# Patient Record
Sex: Female | Born: 1935 | Race: White | Hispanic: No | Smoking: Never smoker
Health system: Southern US, Community
[De-identification: ages and names within clinical notes are randomized; demographics above are authoritative.]

## PROBLEM LIST (undated history)

## (undated) DIAGNOSIS — I351 Nonrheumatic aortic (valve) insufficiency: Secondary | ICD-10-CM

## (undated) DIAGNOSIS — E039 Hypothyroidism, unspecified: Secondary | ICD-10-CM

## (undated) DIAGNOSIS — H9190 Unspecified hearing loss, unspecified ear: Secondary | ICD-10-CM

## (undated) DIAGNOSIS — R519 Headache, unspecified: Secondary | ICD-10-CM

## (undated) DIAGNOSIS — G4730 Sleep apnea, unspecified: Secondary | ICD-10-CM

## (undated) DIAGNOSIS — J45909 Unspecified asthma, uncomplicated: Secondary | ICD-10-CM

## (undated) DIAGNOSIS — M4800 Spinal stenosis, site unspecified: Secondary | ICD-10-CM

## (undated) DIAGNOSIS — H269 Unspecified cataract: Secondary | ICD-10-CM

## (undated) DIAGNOSIS — N189 Chronic kidney disease, unspecified: Secondary | ICD-10-CM

## (undated) DIAGNOSIS — J189 Pneumonia, unspecified organism: Secondary | ICD-10-CM

## (undated) DIAGNOSIS — M545 Low back pain, unspecified: Secondary | ICD-10-CM

## (undated) DIAGNOSIS — K219 Gastro-esophageal reflux disease without esophagitis: Secondary | ICD-10-CM

## (undated) DIAGNOSIS — F0390 Unspecified dementia without behavioral disturbance: Secondary | ICD-10-CM

## (undated) DIAGNOSIS — Z87440 Personal history of urinary (tract) infections: Secondary | ICD-10-CM

## (undated) DIAGNOSIS — Z950 Presence of cardiac pacemaker: Secondary | ICD-10-CM

## (undated) DIAGNOSIS — M810 Age-related osteoporosis without current pathological fracture: Secondary | ICD-10-CM

## (undated) DIAGNOSIS — I639 Cerebral infarction, unspecified: Secondary | ICD-10-CM

## (undated) DIAGNOSIS — IMO0001 Reserved for inherently not codable concepts without codable children: Secondary | ICD-10-CM

## (undated) DIAGNOSIS — M5430 Sciatica, unspecified side: Secondary | ICD-10-CM

## (undated) DIAGNOSIS — Z9981 Dependence on supplemental oxygen: Secondary | ICD-10-CM

## (undated) DIAGNOSIS — M797 Fibromyalgia: Secondary | ICD-10-CM

## (undated) DIAGNOSIS — I499 Cardiac arrhythmia, unspecified: Secondary | ICD-10-CM

## (undated) DIAGNOSIS — I5042 Chronic combined systolic (congestive) and diastolic (congestive) heart failure: Secondary | ICD-10-CM

## (undated) DIAGNOSIS — R55 Syncope and collapse: Secondary | ICD-10-CM

## (undated) DIAGNOSIS — Z8673 Personal history of transient ischemic attack (TIA), and cerebral infarction without residual deficits: Secondary | ICD-10-CM

## (undated) DIAGNOSIS — I2699 Other pulmonary embolism without acute cor pulmonale: Secondary | ICD-10-CM

## (undated) DIAGNOSIS — N179 Acute kidney failure, unspecified: Secondary | ICD-10-CM

## (undated) DIAGNOSIS — R011 Cardiac murmur, unspecified: Secondary | ICD-10-CM

## (undated) DIAGNOSIS — R32 Unspecified urinary incontinence: Secondary | ICD-10-CM

## (undated) DIAGNOSIS — I4891 Unspecified atrial fibrillation: Secondary | ICD-10-CM

## (undated) DIAGNOSIS — I428 Other cardiomyopathies: Secondary | ICD-10-CM

## (undated) DIAGNOSIS — Z87898 Personal history of other specified conditions: Secondary | ICD-10-CM

## (undated) DIAGNOSIS — I2510 Atherosclerotic heart disease of native coronary artery without angina pectoris: Secondary | ICD-10-CM

## (undated) DIAGNOSIS — E785 Hyperlipidemia, unspecified: Secondary | ICD-10-CM

## (undated) DIAGNOSIS — R001 Bradycardia, unspecified: Secondary | ICD-10-CM

## (undated) DIAGNOSIS — S86811A Strain of other muscle(s) and tendon(s) at lower leg level, right leg, initial encounter: Secondary | ICD-10-CM

## (undated) DIAGNOSIS — D649 Anemia, unspecified: Secondary | ICD-10-CM

## (undated) DIAGNOSIS — Z9889 Other specified postprocedural states: Secondary | ICD-10-CM

## (undated) DIAGNOSIS — M1990 Unspecified osteoarthritis, unspecified site: Secondary | ICD-10-CM

## (undated) DIAGNOSIS — R296 Repeated falls: Secondary | ICD-10-CM

## (undated) DIAGNOSIS — R51 Headache: Secondary | ICD-10-CM

## (undated) DIAGNOSIS — I350 Nonrheumatic aortic (valve) stenosis: Principal | ICD-10-CM

## (undated) DIAGNOSIS — Z9289 Personal history of other medical treatment: Secondary | ICD-10-CM

## (undated) DIAGNOSIS — Z87442 Personal history of urinary calculi: Secondary | ICD-10-CM

## (undated) DIAGNOSIS — G629 Polyneuropathy, unspecified: Secondary | ICD-10-CM

## (undated) DIAGNOSIS — Z8709 Personal history of other diseases of the respiratory system: Secondary | ICD-10-CM

## (undated) DIAGNOSIS — R112 Nausea with vomiting, unspecified: Secondary | ICD-10-CM

## (undated) DIAGNOSIS — G8929 Other chronic pain: Secondary | ICD-10-CM

## (undated) DIAGNOSIS — R3915 Urgency of urination: Secondary | ICD-10-CM

## (undated) DIAGNOSIS — I10 Essential (primary) hypertension: Secondary | ICD-10-CM

## (undated) HISTORY — DX: Nonrheumatic aortic (valve) stenosis: I35.0

## (undated) HISTORY — PX: BACK SURGERY: SHX140

## (undated) HISTORY — DX: Essential (primary) hypertension: I10

## (undated) HISTORY — PX: BUNIONECTOMY: SHX129

## (undated) HISTORY — DX: Low back pain, unspecified: M54.50

## (undated) HISTORY — DX: Chronic kidney disease, unspecified: N18.9

## (undated) HISTORY — PX: ELBOW SURGERY: SHX618

## (undated) HISTORY — PX: THYROIDECTOMY, PARTIAL: SHX18

## (undated) HISTORY — PX: CARDIAC CATHETERIZATION: SHX172

## (undated) HISTORY — PX: CATARACT EXTRACTION: SUR2

## (undated) HISTORY — PX: PACEMAKER INSERTION: SHX728

## (undated) HISTORY — DX: Acute kidney failure, unspecified: N17.9

## (undated) HISTORY — DX: Unspecified dementia, unspecified severity, without behavioral disturbance, psychotic disturbance, mood disturbance, and anxiety: F03.90

## (undated) HISTORY — PX: NASAL SEPTUM SURGERY: SHX37

## (undated) HISTORY — PX: SHOULDER SURGERY: SHX246

## (undated) HISTORY — PX: APPENDECTOMY: SHX54

## (undated) HISTORY — DX: Personal history of transient ischemic attack (TIA), and cerebral infarction without residual deficits: Z86.73

## (undated) HISTORY — DX: Syncope and collapse: R55

## (undated) HISTORY — DX: Anemia, unspecified: D64.9

## (undated) HISTORY — PX: NOSE SURGERY: SHX723

## (undated) HISTORY — PX: ABDOMINAL HYSTERECTOMY: SHX81

## (undated) HISTORY — DX: Low back pain: M54.5

## (undated) HISTORY — PX: TONSILLECTOMY: SUR1361

## (undated) HISTORY — DX: Atherosclerotic heart disease of native coronary artery without angina pectoris: I25.10

## (undated) HISTORY — DX: Unspecified atrial fibrillation: I48.91

## (undated) HISTORY — DX: Other pulmonary embolism without acute cor pulmonale: I26.99

## (undated) HISTORY — DX: Other chronic pain: G89.29

## (undated) HISTORY — DX: Hyperlipidemia, unspecified: E78.5

## (undated) HISTORY — DX: Strain of other muscle(s) and tendon(s) at lower leg level, right leg, initial encounter: S86.811A

## (undated) HISTORY — DX: Other cardiomyopathies: I42.8

## (undated) HISTORY — DX: Bradycardia, unspecified: R00.1

## (undated) HISTORY — DX: Age-related osteoporosis without current pathological fracture: M81.0

## (undated) HISTORY — PX: KNEE SURGERY: SHX244

## (undated) HISTORY — DX: Gastro-esophageal reflux disease without esophagitis: K21.9

---

## 1998-02-07 ENCOUNTER — Ambulatory Visit (HOSPITAL_COMMUNITY): Admission: RE | Admit: 1998-02-07 | Discharge: 1998-02-07 | Payer: Self-pay | Admitting: Obstetrics & Gynecology

## 1999-06-19 ENCOUNTER — Encounter: Payer: Self-pay | Admitting: Orthopedic Surgery

## 1999-06-19 ENCOUNTER — Ambulatory Visit (HOSPITAL_COMMUNITY): Admission: RE | Admit: 1999-06-19 | Discharge: 1999-06-19 | Payer: Self-pay | Admitting: Orthopedic Surgery

## 1999-07-04 ENCOUNTER — Ambulatory Visit (HOSPITAL_COMMUNITY): Admission: RE | Admit: 1999-07-04 | Discharge: 1999-07-04 | Payer: Self-pay | Admitting: Orthopedic Surgery

## 1999-07-04 ENCOUNTER — Encounter: Payer: Self-pay | Admitting: Orthopedic Surgery

## 1999-07-18 ENCOUNTER — Encounter: Payer: Self-pay | Admitting: Orthopedic Surgery

## 1999-07-18 ENCOUNTER — Ambulatory Visit (HOSPITAL_COMMUNITY): Admission: RE | Admit: 1999-07-18 | Discharge: 1999-07-18 | Payer: Self-pay | Admitting: Orthopedic Surgery

## 1999-10-29 ENCOUNTER — Other Ambulatory Visit: Admission: RE | Admit: 1999-10-29 | Discharge: 1999-10-29 | Payer: Self-pay | Admitting: Obstetrics and Gynecology

## 1999-12-11 ENCOUNTER — Encounter: Payer: Self-pay | Admitting: Otolaryngology

## 1999-12-13 ENCOUNTER — Inpatient Hospital Stay (HOSPITAL_COMMUNITY): Admission: RE | Admit: 1999-12-13 | Discharge: 1999-12-14 | Payer: Self-pay | Admitting: Otolaryngology

## 1999-12-13 ENCOUNTER — Encounter (INDEPENDENT_AMBULATORY_CARE_PROVIDER_SITE_OTHER): Payer: Self-pay | Admitting: *Deleted

## 2001-08-23 ENCOUNTER — Inpatient Hospital Stay (HOSPITAL_COMMUNITY): Admission: EM | Admit: 2001-08-23 | Discharge: 2001-09-01 | Payer: Self-pay | Admitting: Emergency Medicine

## 2001-08-23 ENCOUNTER — Encounter: Payer: Self-pay | Admitting: Emergency Medicine

## 2001-08-23 ENCOUNTER — Encounter: Payer: Self-pay | Admitting: Cardiovascular Disease

## 2001-08-24 ENCOUNTER — Encounter: Payer: Self-pay | Admitting: Cardiovascular Disease

## 2001-08-25 ENCOUNTER — Encounter: Payer: Self-pay | Admitting: Cardiovascular Disease

## 2001-08-26 ENCOUNTER — Encounter: Payer: Self-pay | Admitting: Cardiovascular Disease

## 2001-09-01 ENCOUNTER — Inpatient Hospital Stay: Admission: AD | Admit: 2001-09-01 | Discharge: 2001-09-11 | Payer: Self-pay | Admitting: Cardiology

## 2001-09-02 ENCOUNTER — Encounter (HOSPITAL_COMMUNITY): Admission: RE | Admit: 2001-09-02 | Discharge: 2001-09-07 | Payer: Self-pay | Admitting: Cardiology

## 2001-09-07 ENCOUNTER — Encounter: Payer: Self-pay | Admitting: Cardiology

## 2003-04-26 ENCOUNTER — Emergency Department (HOSPITAL_COMMUNITY): Admission: EM | Admit: 2003-04-26 | Discharge: 2003-04-26 | Payer: Self-pay | Admitting: Emergency Medicine

## 2003-06-28 ENCOUNTER — Encounter: Admission: RE | Admit: 2003-06-28 | Discharge: 2003-06-28 | Payer: Self-pay | Admitting: Orthopaedic Surgery

## 2003-10-26 ENCOUNTER — Ambulatory Visit (HOSPITAL_COMMUNITY): Admission: RE | Admit: 2003-10-26 | Discharge: 2003-10-26 | Payer: Self-pay | Admitting: Cardiology

## 2003-10-26 ENCOUNTER — Inpatient Hospital Stay (HOSPITAL_COMMUNITY): Admission: EM | Admit: 2003-10-26 | Discharge: 2003-11-05 | Payer: Self-pay | Admitting: Emergency Medicine

## 2004-07-22 ENCOUNTER — Encounter: Admission: RE | Admit: 2004-07-22 | Discharge: 2004-07-22 | Payer: Self-pay | Admitting: Neurology

## 2004-10-15 ENCOUNTER — Encounter
Admission: RE | Admit: 2004-10-15 | Discharge: 2004-10-15 | Payer: Self-pay | Admitting: Physical Medicine and Rehabilitation

## 2006-08-13 ENCOUNTER — Encounter: Admission: RE | Admit: 2006-08-13 | Discharge: 2006-08-13 | Payer: Self-pay | Admitting: *Deleted

## 2006-08-25 ENCOUNTER — Encounter: Admission: RE | Admit: 2006-08-25 | Discharge: 2006-11-23 | Payer: Self-pay | Admitting: Specialist

## 2006-11-05 ENCOUNTER — Emergency Department (HOSPITAL_COMMUNITY): Admission: EM | Admit: 2006-11-05 | Discharge: 2006-11-06 | Payer: Self-pay | Admitting: Emergency Medicine

## 2006-11-17 ENCOUNTER — Inpatient Hospital Stay (HOSPITAL_COMMUNITY): Admission: AD | Admit: 2006-11-17 | Discharge: 2006-11-23 | Payer: Self-pay | Admitting: Internal Medicine

## 2006-12-10 ENCOUNTER — Encounter: Admission: RE | Admit: 2006-12-10 | Discharge: 2006-12-10 | Payer: Self-pay | Admitting: Internal Medicine

## 2007-09-22 ENCOUNTER — Ambulatory Visit: Payer: Self-pay | Admitting: Licensed Clinical Social Worker

## 2007-10-27 ENCOUNTER — Ambulatory Visit: Payer: Self-pay | Admitting: Licensed Clinical Social Worker

## 2007-12-15 ENCOUNTER — Ambulatory Visit: Payer: Self-pay | Admitting: Licensed Clinical Social Worker

## 2007-12-29 ENCOUNTER — Ambulatory Visit: Payer: Self-pay | Admitting: Licensed Clinical Social Worker

## 2008-02-09 ENCOUNTER — Ambulatory Visit: Payer: Self-pay | Admitting: Licensed Clinical Social Worker

## 2008-03-10 ENCOUNTER — Ambulatory Visit (HOSPITAL_COMMUNITY): Admission: RE | Admit: 2008-03-10 | Discharge: 2008-03-10 | Payer: Self-pay | Admitting: *Deleted

## 2008-03-10 ENCOUNTER — Encounter (INDEPENDENT_AMBULATORY_CARE_PROVIDER_SITE_OTHER): Payer: Self-pay | Admitting: *Deleted

## 2008-04-05 ENCOUNTER — Ambulatory Visit: Payer: Self-pay | Admitting: Licensed Clinical Social Worker

## 2008-06-14 ENCOUNTER — Ambulatory Visit: Payer: Self-pay | Admitting: Licensed Clinical Social Worker

## 2008-07-22 ENCOUNTER — Emergency Department (HOSPITAL_COMMUNITY): Admission: EM | Admit: 2008-07-22 | Discharge: 2008-07-22 | Payer: Self-pay | Admitting: Emergency Medicine

## 2008-08-23 ENCOUNTER — Ambulatory Visit: Payer: Self-pay | Admitting: Licensed Clinical Social Worker

## 2009-02-26 ENCOUNTER — Encounter: Admission: RE | Admit: 2009-02-26 | Discharge: 2009-02-26 | Payer: Self-pay | Admitting: Internal Medicine

## 2009-08-30 ENCOUNTER — Encounter: Admission: RE | Admit: 2009-08-30 | Discharge: 2009-08-30 | Payer: Self-pay | Admitting: Otolaryngology

## 2009-12-10 ENCOUNTER — Ambulatory Visit: Payer: Self-pay | Admitting: Cardiology

## 2009-12-10 ENCOUNTER — Inpatient Hospital Stay (HOSPITAL_COMMUNITY): Admission: AD | Admit: 2009-12-10 | Discharge: 2009-12-15 | Payer: Self-pay | Admitting: Internal Medicine

## 2009-12-10 ENCOUNTER — Encounter (INDEPENDENT_AMBULATORY_CARE_PROVIDER_SITE_OTHER): Payer: Self-pay | Admitting: Internal Medicine

## 2009-12-12 ENCOUNTER — Encounter (INDEPENDENT_AMBULATORY_CARE_PROVIDER_SITE_OTHER): Payer: Self-pay | Admitting: Internal Medicine

## 2010-04-21 DIAGNOSIS — R001 Bradycardia, unspecified: Secondary | ICD-10-CM

## 2010-04-21 HISTORY — DX: Bradycardia, unspecified: R00.1

## 2010-05-12 ENCOUNTER — Encounter: Payer: Self-pay | Admitting: Orthopedic Surgery

## 2010-06-15 ENCOUNTER — Encounter: Payer: Self-pay | Admitting: Cardiology

## 2010-06-15 ENCOUNTER — Inpatient Hospital Stay (HOSPITAL_COMMUNITY)
Admission: EM | Admit: 2010-06-15 | Discharge: 2010-07-03 | DRG: 242 | Disposition: A | Discharge status: Skilled Nursing Facility | Payer: Medicare Other | Attending: Cardiology | Admitting: Cardiology

## 2010-06-15 ENCOUNTER — Emergency Department (HOSPITAL_COMMUNITY): Payer: Medicare Other

## 2010-06-15 DIAGNOSIS — Z86711 Personal history of pulmonary embolism: Secondary | ICD-10-CM

## 2010-06-15 DIAGNOSIS — E870 Hyperosmolality and hypernatremia: Secondary | ICD-10-CM | POA: Diagnosis present

## 2010-06-15 DIAGNOSIS — I498 Other specified cardiac arrhythmias: Principal | ICD-10-CM | POA: Diagnosis present

## 2010-06-15 DIAGNOSIS — I080 Rheumatic disorders of both mitral and aortic valves: Secondary | ICD-10-CM | POA: Diagnosis present

## 2010-06-15 DIAGNOSIS — I69998 Other sequelae following unspecified cerebrovascular disease: Secondary | ICD-10-CM

## 2010-06-15 DIAGNOSIS — M545 Low back pain, unspecified: Secondary | ICD-10-CM | POA: Diagnosis present

## 2010-06-15 DIAGNOSIS — I509 Heart failure, unspecified: Secondary | ICD-10-CM | POA: Diagnosis present

## 2010-06-15 DIAGNOSIS — J4489 Other specified chronic obstructive pulmonary disease: Secondary | ICD-10-CM | POA: Diagnosis present

## 2010-06-15 DIAGNOSIS — Z888 Allergy status to other drugs, medicaments and biological substances status: Secondary | ICD-10-CM

## 2010-06-15 DIAGNOSIS — K219 Gastro-esophageal reflux disease without esophagitis: Secondary | ICD-10-CM | POA: Diagnosis present

## 2010-06-15 DIAGNOSIS — I4891 Unspecified atrial fibrillation: Secondary | ICD-10-CM | POA: Diagnosis present

## 2010-06-15 DIAGNOSIS — I5023 Acute on chronic systolic (congestive) heart failure: Secondary | ICD-10-CM | POA: Diagnosis present

## 2010-06-15 DIAGNOSIS — D631 Anemia in chronic kidney disease: Secondary | ICD-10-CM | POA: Diagnosis present

## 2010-06-15 DIAGNOSIS — G8929 Other chronic pain: Secondary | ICD-10-CM | POA: Diagnosis present

## 2010-06-15 DIAGNOSIS — N179 Acute kidney failure, unspecified: Secondary | ICD-10-CM | POA: Diagnosis present

## 2010-06-15 DIAGNOSIS — N184 Chronic kidney disease, stage 4 (severe): Secondary | ICD-10-CM | POA: Diagnosis present

## 2010-06-15 DIAGNOSIS — F0390 Unspecified dementia without behavioral disturbance: Secondary | ICD-10-CM | POA: Diagnosis present

## 2010-06-15 DIAGNOSIS — Z7901 Long term (current) use of anticoagulants: Secondary | ICD-10-CM

## 2010-06-15 DIAGNOSIS — J449 Chronic obstructive pulmonary disease, unspecified: Secondary | ICD-10-CM | POA: Diagnosis present

## 2010-06-15 DIAGNOSIS — R55 Syncope and collapse: Secondary | ICD-10-CM

## 2010-06-15 DIAGNOSIS — Z9849 Cataract extraction status, unspecified eye: Secondary | ICD-10-CM

## 2010-06-15 DIAGNOSIS — I2510 Atherosclerotic heart disease of native coronary artery without angina pectoris: Secondary | ICD-10-CM | POA: Diagnosis present

## 2010-06-15 DIAGNOSIS — I517 Cardiomegaly: Secondary | ICD-10-CM | POA: Diagnosis present

## 2010-06-15 DIAGNOSIS — E785 Hyperlipidemia, unspecified: Secondary | ICD-10-CM | POA: Diagnosis present

## 2010-06-15 DIAGNOSIS — R5381 Other malaise: Secondary | ICD-10-CM | POA: Diagnosis present

## 2010-06-15 DIAGNOSIS — I129 Hypertensive chronic kidney disease with stage 1 through stage 4 chronic kidney disease, or unspecified chronic kidney disease: Secondary | ICD-10-CM | POA: Diagnosis present

## 2010-06-15 DIAGNOSIS — R29898 Other symptoms and signs involving the musculoskeletal system: Secondary | ICD-10-CM | POA: Diagnosis present

## 2010-06-15 DIAGNOSIS — E876 Hypokalemia: Secondary | ICD-10-CM | POA: Diagnosis not present

## 2010-06-15 DIAGNOSIS — E039 Hypothyroidism, unspecified: Secondary | ICD-10-CM | POA: Diagnosis present

## 2010-06-15 DIAGNOSIS — I428 Other cardiomyopathies: Secondary | ICD-10-CM | POA: Diagnosis present

## 2010-06-15 DIAGNOSIS — Z7982 Long term (current) use of aspirin: Secondary | ICD-10-CM

## 2010-06-15 LAB — BASIC METABOLIC PANEL
GFR calc Af Amer: 10 mL/min — ABNORMAL LOW (ref >60)
Sodium: 129 mEq/L — ABNORMAL LOW (ref 135–145)

## 2010-06-15 LAB — COMPREHENSIVE METABOLIC PANEL
ALT: 134 U/L — ABNORMAL HIGH (ref 0–35)
Albumin: 2.9 g/dL — ABNORMAL LOW (ref 3.5–5.2)
BUN: 86 mg/dL — ABNORMAL HIGH (ref 6–23)
CO2: 19 mEq/L (ref 19–32)
GFR calc non Af Amer: 9 mL/min — ABNORMAL LOW (ref >60)
Glucose, Bld: 139 mg/dL — ABNORMAL HIGH (ref 70–99)
Potassium: 5.5 mEq/L — ABNORMAL HIGH (ref 3.5–5.1)

## 2010-06-15 LAB — DIFFERENTIAL
Basophils Absolute: 0 10*3/uL (ref 0.0–0.1)
Basophils Relative: 0 % (ref 0–1)
Lymphocytes Relative: 7 % — ABNORMAL LOW (ref 12–46)
Neutro Abs: 7.8 10*3/uL — ABNORMAL HIGH (ref 1.7–7.7)

## 2010-06-15 LAB — POCT CARDIAC MARKERS: Troponin i, poc: <0.05 ng/mL (ref 0.00–0.09)

## 2010-06-15 LAB — CBC
HCT: 23.1 % — ABNORMAL LOW (ref 36.0–46.0)
Hemoglobin: 7.8 g/dL — ABNORMAL LOW (ref 12.0–15.0)
MCH: 28.7 pg (ref 26.0–34.0)
MCHC: 33.8 g/dL (ref 30.0–36.0)
MCV: 84.9 fL (ref 78.0–100.0)
Platelets: 252 10*3/uL (ref 150–400)
RDW: 16.4 % — ABNORMAL HIGH (ref 11.5–15.5)
WBC: 9.5 10*3/uL (ref 4.0–10.5)

## 2010-06-15 LAB — POCT I-STAT, CHEM 8
Calcium, Ion: 1.02 mmol/L — ABNORMAL LOW (ref 1.12–1.32)
Glucose, Bld: 153 mg/dL — ABNORMAL HIGH (ref 70–99)
HCT: 26 % — ABNORMAL LOW (ref 36.0–46.0)
Potassium: 5.2 mEq/L — ABNORMAL HIGH (ref 3.5–5.1)
TCO2: 14 mmol/L (ref 0–100)

## 2010-06-15 LAB — PROTIME-INR: Prothrombin Time: 29.4 seconds — ABNORMAL HIGH (ref 11.6–15.2)

## 2010-06-15 NOTE — H&P (Signed)
NAMESHARNAE, [name]              ACCOUNT NO.:  [id number]  MEDICAL RECORD NO.:  [id number]           PATIENT TYPE:  E  LOCATION:  [facility]                         FACILITY:  [facility]  PHYSICIAN:  Jonelle Sidle, MD DATE OF BIRTH:  1932-05-28  DATE OF ADMISSION:  06/15/2010 DATE OF DISCHARGE:                             HISTORY & PHYSICAL   PRIMARY CARE PHYSICIAN:  Massie Maroon, MD  REASON FOR ADMISSION:  Apparent syncope with subsequent diagnosis of bradycardia and congestive heart failure.  HISTORY OF PRESENT ILLNESS:  [name] is a 77 year old woman previously followed by Dr. Aggie Cosier with a history of nonobstructive coronary artery disease diagnosed at catheterization back in 2003, systolic congestive heart failure exacerbations over time with LVEF ranging from 30% up to 50%, atrial fibrillation managed with Coumadin and amiodarone, chronic renal insufficiency with episode of acute renal failure in August 2011, moderate aortic valve stenosis, hypothyroidism on Synthroid, and hypertension among other problems listed below.  She reports being in her usual state of health until the last 4-5 days.  She states that over this time period, she has been progressively weak with dyspnea on exertion of at least NYHA class III with ambulating around her home, also intermittent chest pressure.  She had a fall today, possibly syncope, and EMS was summoned with subsequent transport to the emergency room.  She was noted to be significantly bradycardic with heart rates around 30, junctional escape rhythm noted with interventricular conduction delay of incomplete left bundle-branch block type.  Systolic blood pressure was in the 80s to 90s, and the patient was noted to be mildly hypoxic and short of breath.  She was treated by emergency department staff with a fluid bolus as well as atropine. Follow-up labs revealed a potassium of 5.2, BUN and creatinine of 97 and 5.2 respectively, BNP of  2017, sodium of 129, hemoglobin of 8.8, and troponin I of less than 0.05.  We were consulted to see her and had additional treatment given including calcium gluconate and institution of dopamine infusion.  The patient's heart rate increased into the 40s to 50s, still a junctional rhythm, and her systolic blood pressure increased into the 100 to 110 range.  She has a Foley catheter in place with little urine output at this point, and states that she feels somewhat better, although remains weak and somewhat short of breath. Also states that she feels "cold."  In reviewing her history, I note that she had an episode of fairly acute systolic heart failure back in 2003, at which time, she underwent cardiac catheterization that did not demonstrate any obstructive coronary artery disease.  She apparently required fairly aggressive support at that time including intra-aortic balloon pump and right heart catheterization.  Her more recent episode of acute renal failure in August 2011 was felt to be potentially related to medications and alsodehydration with creatinine improving from 5.1 down to 2.5 at discharge. She was taken off of an angiotensin receptor blocker at that time, and has presumably been on the present medical regimen noted below since then.  I have no other outpatient records at this time for  review.  ALLERGIES:  Listed as NUBAIN, CODEINE, AND DARVON.  HOME MEDICATIONS: 1. Alendronate 70 mg weekly. 2. Amiodarone 200 mg p.o. daily. 3. Amlodipine 5 mg p.o. daily. 4. Aricept 10 mg p.o. at bedtime. 5. Aspirin 81 mg p.o. daily. 6. Calcitriol 0.25 mcg daily. 7. Carvedilol 25 mg p.o. t.i.d. 8. Coumadin 3 mg 1/2 to 1 tablet as directed. 9. Lasix 20 mg p.o. daily. 10.Hydralazine 25 mg p.o. t.i.d. 11.Hydrocodone/APAP 10/325 mg p.o. q.6 h. p.r.n. 12.Imdur 30 mg p.o. b.i.d. 13.Lyrica 50 mg p.o. daily. 14.Metronidazole 500 mg p.o. q.8 h. 15.Namenda 10 mg p.o. daily. 16.Nexium 40 mg  p.o. daily. 17.Nitroglycerin sublingual 0.4 mg p.r.n. 18.Pravachol 40 mg p.o. at bedtime. 19.Nasal saline spray p.r.n. 20.Synthroid 100 mcg p.o. daily. 21.Vitamin D 1000 units p.o. daily. 22.Zolpidem 5 mg p.o. at bedtime p.r.n. 23.Zyrtec OTC one p.o. daily p.r.n.  PAST MEDICAL HISTORY:  As discussed above.  ADDITIONAL PROBLEMS: 1. Chronic anemia. 2. Gastroesophageal reflux disease. 3. Hearing loss. 4. Migraine headaches. 5. Previous history of stroke. 6. Bilateral cataract surgery. 7. Bunion surgery. 8. Appendectomy. 9. Hysterectomy. 10.Partial thyroidectomy. 11.Back surgery. 12.Left knee surgery. 13.Nasal surgery. 14.Ulnar nerve surgery.  FAMILY HISTORY:  The patient's mother died with ovarian cancer.  Father died with history of epilepsy at young age, in his 54s.  History of stroke and diabetes in the patient's siblings.  SOCIAL HISTORY:  The patient is retired and divorced.  She has lived locally with her son.  No active tobacco or alcohol use.  REVIEW OF SYSTEMS:  As described above.  Reports no obvious fevers or chills, no cough or hemoptysis.  States that she has been compliant with her medications.  Has had intermittent chest pain with exertion recently.  No palpitations.  No orthopnea.  Reports increasing lower extremity edema over the last few days.  Appetite has been fair. Otherwise, reviewed negative.  PHYSICAL EXAMINATION:  VITAL SIGNS:  Blood pressure 110/80, heart rate 50, and junctional rhythm.  Lung saturations is 92% on 4 liters nasal cannula.  Respirations 24, temperature 97.3 degrees. GENERAL:  This is an obese elderly woman with short of breath to mild degree without active chest pain, also complains of nausea intermittently, but feeling somewhat better. HEENT:  Conjunctiva is normal.  Oropharynx clear. NECK:  Supple.  Difficult to assess jugular venous pressure.  There is a right EJ IV in place.  No obvious thyromegaly.  No significant  carotid bruits. LUNGS:  Exhibit coarse breath sounds with scattered crackles.  No egophony. CARDIAC:  Distant regular heart sounds, bradycardic, no obvious S3, soft systolic murmur at the base.  No pericardial rub. ABDOMEN:  Soft, somewhat protuberant.  Bowel sounds are diminished.  No guarding or rebound. EXTREMITIES:  Exhibit 2+ edema.  Distal pulses 1+. SKIN:  Warm and dry. MUSCULOSKELETAL:  Mild kyphosis is noted. NEUROPSYCHIATRIC:  The patient is alert and oriented x3.  Affect is grossly appropriate.  LABORATORY DATA:  WBC is 9.5, hemoglobin 8.8, hematocrit 26.0, platelets 252.  INR is 2.7.  Sodium 129, potassium 5.2, chloride 104, bicarb 16, glucose 153, BUN 97, creatinine 5.2, CK-MB is 10.6 with troponin-I of less than 0.05, and myoglobin greater than 500, BNP is 2017.  Chest x-ray from February 25 shows cardiomegaly with bilateral air space disease, consistent with pulmonary edema.  IMPRESSION: 1. Presentation with apparent syncope, following increasing shortness     of breath with activity and weakness as well as intermittent chest     pain.  The patient is  noted to be significantly bradycardic with a     junctional escape rhythm.  She is concurrently mildly hyperkalemic     with a potassium of 5.2 in the setting of acute on     chronic renal failure, also presumed systolic congestive heart     failure with BNP of 2017 and pulmonary edema by chest x-ray.  Last     LVEF by echocardiogram in August 2011 was 50% with global     hypokinesis.  The patient reports compliance with her regular     medications listed above.  The actual sequence of events in her     presenting illness is not entirely clear.  Whether she has     developed acute on chronic renal failure with decreased clearance     of her regular medications resulting in congestive heart failure     and bradycardia is possible, however, she may also be having     unstable angina with ischemic bradycardia and  exacerbation of     congestive heart failure, perhaps has manifested progression in     primary conduction system disease with exacerbation of congestive     heart failure, or has relatively low output and congestive heart     failure with subsequent acute on chronic renal failure. 2. Atrial fibrillation, managed with Coumadin, and amiodarone as an     outpatient.  Also on Coreg, listed at t.i.d. dosing, although not     entirely certain about this.  Her INR is therapeutic suggesting     that she has been taking her medicines. 3. History of hypertension, the patient presented hypotensive in the     setting of her acute illness. 4. Chronic renal insufficiency, baseline not entirely certain, but     creatinine was 2.5 as of her discharge last August. 5. Hyperlipidemia, on Pravachol. 6. History of previously documented nonobstructive coronary artery     disease at catheterization in 2003. 7. Hypothyroidism, on Synthroid. 8. Presumably some degree of dementia based on current treatment with     Aricept.  Incidentally, Aricept can also complicate bradycardia,     particularly on other contributing medications.  PLAN:  The patient is being admitted to the CCU.  Temporary pacer pads are in place.  Plan is to continue dopamine infusion, follow-up 2-D echocardiogram to reassess cardiac structure and function, and cycle cardiac markers.  Full-dose anticoagulation is not planned at this time in light of INR of 2.7, hemoglobin of 8.8, and until more objective information is available.  SCDs will be used for DVT prophylaxis.  Most of her regular medications will be held, specifically including amiodarone, Coumadin, Coreg, Norvasc, Aricept, hydralazine, and Imdur. We will initiate Lasix at 40 mg IV q.12 h. and continue Foley catheter. A formal Nephrology consultation will be requested.  She has difficult IV access, and we will plan to ask for a PICC line, also to help [place] a transduced CVP and obtain  co-oximetry numbers to investigate the possibility of low output heart failure.  If this is not possible, a central line can be placed, and INR reversed if necessary. Follow-up TSH will be obtained.  While it is not clear that her symptoms are not ischemic, a cardiac catheterization is not planned at this time in light of acute renal failure, and very high risk of contrast nephropathy, necessitating hemodialysis.  She may however already be at that point, although the absolute time course of the change in her renal function is not  entirely clear.  Further plans to follow.     Jonelle Sidle, MD     SGM/MEDQ  D:  06/15/2010  T:  06/15/2010  Job:  [id number]  cc:   Massie Maroon, MD  Electronically Signed by Nona Dell MD on 06/15/2010 08:22:40 PM

## 2010-06-16 ENCOUNTER — Inpatient Hospital Stay (HOSPITAL_COMMUNITY): Payer: Medicare Other

## 2010-06-16 DIAGNOSIS — I5023 Acute on chronic systolic (congestive) heart failure: Secondary | ICD-10-CM

## 2010-06-16 DIAGNOSIS — I059 Rheumatic mitral valve disease, unspecified: Secondary | ICD-10-CM

## 2010-06-16 DIAGNOSIS — N179 Acute kidney failure, unspecified: Secondary | ICD-10-CM

## 2010-06-16 LAB — URINALYSIS, MICROSCOPIC ONLY
Ketones, ur: NEGATIVE mg/dL
Nitrite: NEGATIVE
Protein, ur: NEGATIVE mg/dL
Urine Glucose, Fasting: NEGATIVE mg/dL
Urobilinogen, UA: 0.2 mg/dL (ref 0.0–1.0)

## 2010-06-16 LAB — FERRITIN: Ferritin: 62 ng/mL (ref 10–291)

## 2010-06-16 LAB — CBC
MCH: 28.5 pg (ref 26.0–34.0)
MCV: 83.3 fL (ref 78.0–100.0)
Platelets: 269 10*3/uL (ref 150–400)
RBC: 2.81 MIL/uL — ABNORMAL LOW (ref 3.87–5.11)
RDW: 15.8 % — ABNORMAL HIGH (ref 11.5–15.5)

## 2010-06-16 LAB — IRON AND TIBC
Saturation Ratios: 3 % — ABNORMAL LOW (ref 20–55)
TIBC: 311 ug/dL (ref 250–470)
UIBC: 301 ug/dL

## 2010-06-16 LAB — BASIC METABOLIC PANEL
CO2: 17 mEq/L — ABNORMAL LOW (ref 19–32)
Calcium: 8.1 mg/dL — ABNORMAL LOW (ref 8.4–10.5)
Chloride: 102 mEq/L (ref 96–112)
GFR calc Af Amer: 10 mL/min — ABNORMAL LOW (ref >60)
Glucose, Bld: 112 mg/dL — ABNORMAL HIGH (ref 70–99)
Potassium: 5.2 mEq/L — ABNORMAL HIGH (ref 3.5–5.1)
Sodium: 130 mEq/L — ABNORMAL LOW (ref 135–145)

## 2010-06-16 LAB — PHOSPHORUS: Phosphorus: 5.7 mg/dL — ABNORMAL HIGH (ref 2.3–4.6)

## 2010-06-16 LAB — CARDIAC PANEL(CRET KIN+CKTOT+MB+TROPI)
CK, MB: 9.4 ng/mL (ref 0.3–4.0)
Relative Index: 1.6 (ref 0.0–2.5)
Total CK: 604 U/L — ABNORMAL HIGH (ref 7–177)
Troponin I: 0.06 ng/mL (ref 0.00–0.06)

## 2010-06-16 LAB — LIPID PANEL
Cholesterol: 89 mg/dL (ref 0–200)
HDL: 49 mg/dL (ref >39)

## 2010-06-16 LAB — HEPATITIS B SURFACE ANTIBODY,QUALITATIVE: Hep B S Ab: NEGATIVE

## 2010-06-16 LAB — ALT: ALT: 133 U/L — ABNORMAL HIGH (ref 0–35)

## 2010-06-17 ENCOUNTER — Inpatient Hospital Stay (HOSPITAL_COMMUNITY): Payer: Medicare Other

## 2010-06-17 LAB — PTH, INTACT AND CALCIUM
Calcium, Total (PTH): 8.3 mg/dL — ABNORMAL LOW (ref 8.4–10.5)
PTH: 178.9 pg/mL — ABNORMAL HIGH (ref 14.0–72.0)

## 2010-06-17 LAB — PREPARE FRESH FROZEN PLASMA: Unit division: 0

## 2010-06-17 LAB — COMPREHENSIVE METABOLIC PANEL
ALT: 133 U/L — ABNORMAL HIGH (ref 0–35)
AST: 121 U/L — ABNORMAL HIGH (ref 0–37)
Albumin: 2.9 g/dL — ABNORMAL LOW (ref 3.5–5.2)
Calcium: 8.5 mg/dL (ref 8.4–10.5)
GFR calc Af Amer: 19 mL/min — ABNORMAL LOW (ref >60)
Sodium: 140 mEq/L (ref 135–145)
Total Protein: 6.1 g/dL (ref 6.0–8.3)

## 2010-06-17 LAB — HEPATITIS B CORE ANTIBODY, TOTAL: Hep B Core Total Ab: NEGATIVE

## 2010-06-17 LAB — BASIC METABOLIC PANEL
BUN: 47 mg/dL — ABNORMAL HIGH (ref 6–23)
Calcium: 8 mg/dL — ABNORMAL LOW (ref 8.4–10.5)
GFR calc non Af Amer: 17 mL/min — ABNORMAL LOW (ref >60)
Potassium: 3.6 mEq/L (ref 3.5–5.1)
Sodium: 138 mEq/L (ref 135–145)

## 2010-06-17 LAB — CBC
MCHC: 33.7 g/dL (ref 30.0–36.0)
Platelets: 249 10*3/uL (ref 150–400)
RDW: 16.1 % — ABNORMAL HIGH (ref 11.5–15.5)
WBC: 10.9 10*3/uL — ABNORMAL HIGH (ref 4.0–10.5)

## 2010-06-17 LAB — TSH: TSH: 0.243 u[IU]/mL — ABNORMAL LOW (ref 0.350–4.500)

## 2010-06-17 NOTE — Consult Note (Signed)
NAMEHAVANNAH, STREAT NO.:  [id number]  MEDICAL RECORD NO.:  [id number]           PATIENT TYPE:  I  LOCATION:  2902                         FACILITY:  [facility]  PHYSICIAN:  Maree Krabbe, M.D.DATE OF BIRTH:  October 02, 1932  DATE OF CONSULTATION:  06/15/2010 DATE OF DISCHARGE:                                CONSULTATION   PRIMARY DOCTOR:  Massie Maroon, MD  REFERRING PHYSICIAN:  Jonelle Sidle, MD  REASON FOR CONSULT:  Acute on chronic renal failure.  HISTORY:  The patient is a 77 year old female with a history of chronic kidney disease, hypertension, previous stroke and atrial fibrillation on Coumadin.  Baseline creatinine was 2.6 in 2008 and 2.4 in August 2011.  The patient has been feeling weak for last few days and presented with shortness of breath and chest pain.  She had a fall and a possible syncopal episode today, as well.  At the emergency room she was found to have a junctional bradycardia with heart rate of 30,  potassium 5.2, creatinine of 5.2 and BNP of 2017.  Cardiac enzymes were negative. Hemoglobin was 8.8 and there were no definite acute changes on EKG. Chest x-ray showed pulmonary edema.  The patient was treated with atropine, IV fluids, calcium gluconate and placed on a dopamine drip with increased heart rate into the 50s and improved systolic blood pressure to 110.  She has not had much urine output during the day today.  Foley catheter has been placed.  The patient's son provides most of the history.  The patient is arousable and contributes also.  Apparently, the patient had another presentation for low heart rate in the office setting within the past several months, at which time her Coreg was decreased from 25 mg four times a day to 25 mg twice a day.  Also, her ARB, thiazide, and spironolactone were discontinued at that time.  The son states she was here in August 2011 for renal failure and indeed the computer records show that she  had a creatinine of 4.8, which improved to 2.4 at discharge at that time.  The ultrasound at that time showed kidneys of 9.6 cm on one side and 11 cm on the other side.  PAST MEDICAL HISTORY: 1. Chronic low back pain and prior history of methadone use, and     history of cardiac arrest after receiving Nubain. 2. Cardiomyopathy, EF was 30-40% in the past, but improved up to 50%     on most recent echocardiogram.  History of mild aortic stenosis by     heart catheterization in 2003, which showed no significant cororary disease.     She is also on intra-aortic balloon pump for cardiogenic shock      during that admission in 2003. 3.  Also history of hypertension, hyperlipidemia, hypothyroidism, atrial fibrillation,     chronic kidney disease, and mild stroke with left-sided weakness.   PAST SURGICAL HISTORY:  She has multiple joint surgeries including left knee, bilateral shoulders and other joint surgeries.  She has had a hysterectomy, thyroidectomy, heart catheterization.  SOCIAL HISTORY:  He is divorced, has two grown  children, a daughter and a son.  The son lives with her here in [place].  She does not smoke or drink.  FAMILY HISTORY:  Noncontributory.  REVIEW OF SYSTEMS:  Denies any recent fever, chills, sweats, headache, visual change, sore throat, difficulty swallowing.  She does have orthopnea and shortness of breath with exertion.  No chest pain, abdominal pain, nausea, vomiting, diarrhea, difficulty voiding, dysuria, ankle swelling, focal numbness, or weakness.  PHYSICAL EXAMINATION:  VITAL SIGNS:  Blood pressure is 105/30, heart rate is 45 on a dopamine drip, junctional rhythm at rate of 42, not sinus bradycardia, respirations are 20 and nonlabored. GENERAL:  The patient is a well-developed, well-nourished, elderly female in no distress. SKIN:  Warm and dry without rash or cyanosis. HEENT:  PERRLA, EOMI.  Throat is moist. NECK:  Supple.  Neck veins appear to be  distended.  JVP of over 14. CHEST:  Bibasilar crackles.  No rhonchi or wheezing. CARDIAC:  Regular rate and rhythm with 2/6 systolic ejection murmur at the right upper sternal border.  No heaves, lifts, no rub or gallop. ABDOMEN:  Soft, nontender, active bowel sounds.  No masses.  No ascites. No organomegaly. GU:  Foley catheter is in place, draining very small amounts of dark amber urine. EXTREMITIES:  Has 1+ pitting edema of the upper and lower legs bilaterally.  Good pulses in the feet in the femoral position.  No femoral bruits.  No carotid bruits could be detected. NEUROLOGIC:  She has mild focal left-sided weakness with slightly decreased grip compared to the right.  She has mild-to-moderate generalized weakness in all extremities.  She knows the year and the location.  HOME MEDICATIONS: 1. Amlodipine 5 daily. 2. Spironolactone 12.5 daily. 3. Levothyroxine 100 mcg daily. 4. Pravastatin 40 daily. 5. Nexium 40 daily. 6. Namenda 10 b.i.d. 7. Lyrica one b.i.d. 8. Imdur XR 60 mg b.i.d. 9. Hydrocodone. 10.Hydralazine 100 t.i.d. 11.Lasix 20 daily. 12.Coumadin. 13.Coreg 25 b.i.d. 14.Rocaltrol 0.25 mcg daily. 15.Enteric-coated aspirin one daily. 16.Aricept 10 daily. 17.Amiodarone 200 mg one daily. 18.Alendronate 70 mg one weekly.  ALLERGIES: 1. NUBAIN. 2. CODEINE. 3. PROPOXYPHENE. 4. DARVOCET.  LABORATORY DATA:  Sodium 128, potassium 5.5, CO2 19, BUN 86, creatinine 4.87, it was 5.2 on admission or earlier today.  GFR estimated 9 mL per minute.  AST 162, ALT 134, which are elevated.  Albumin 2.9, calcium 8, magnesium 2.4.  BNP 2017.  Troponin 0.05, CK-MB is 15.7, which is high. Total CPK is 780.  Chest x-ray shows cardiomegaly with bilateral perihilar airspace disease consistent with pulmonary edema/congestive heart failure.  White blood count 9000, hemoglobin 7.8, hematocrit 23%, platelets 252.  IMPRESSION: 1. Acute on chronic renal failure, unclear etiology.  The  patient is     volume overloaded, so we cannot implicate hypovolemia.  No ACE     inhibitors or ARBs.  No nonsteroidal anti-inflammatory drug use.     Unclear etiology. 2. Chronic kidney disease, stage IV, with baseline creatinine of      2.5 in August 2011. 3. Symptomatic bradycardia.  Potassium is slightly elevated at 5.2,     unlikely to be the primary cause of this problem.  We will need to     correct the potassium and see if this helps.  Give     Kayexalate. 4. Pulmonary edema with volume overload.  Probably due to progressive     renal failure.  Increase Lasix to 120 IV q.8 h. 5. History of hypertension. 6. Hypothyroidism. 7. Hyperlipidemia. 8.  Chronic atrial fibrillation, on Coumadin. 9. Cardiomyopathy with ejection fraction of 50%. 10.History of mild stroke in the past. 11.Chronic low back pain. 12.Dementia, on memantine and Aricept.  RECOMMENDATIONS: 1. Increase Lasix to Kayexalate and follow up rhythm after correction     of potassium. 2. If the patient does not diurese and has persistent pulmonary edema,     we will have to have discussions with the patient and the family     about dialysis.     Maree Krabbe, M.D.     RDS/MEDQ  D:  06/15/2010  T:  06/16/2010  Job:  [id number]  cc:   Jonelle Sidle, MD Massie Maroon, MD  Electronically Signed by Delano Metz M.D. on 06/17/2010 78:29:56 AM

## 2010-06-18 ENCOUNTER — Inpatient Hospital Stay (HOSPITAL_COMMUNITY): Payer: Medicare Other

## 2010-06-18 LAB — RENAL FUNCTION PANEL
BUN: 49 mg/dL — ABNORMAL HIGH (ref 6–23)
CO2: 29 mEq/L (ref 19–32)
Calcium: 8 mg/dL — ABNORMAL LOW (ref 8.4–10.5)
Creatinine, Ser: 2.56 mg/dL — ABNORMAL HIGH (ref 0.4–1.2)
GFR calc Af Amer: 22 mL/min — ABNORMAL LOW (ref >60)
GFR calc non Af Amer: 18 mL/min — ABNORMAL LOW (ref >60)

## 2010-06-18 LAB — CBC
HCT: 21.8 % — ABNORMAL LOW (ref 36.0–46.0)
MCH: 28.3 pg (ref 26.0–34.0)
MCHC: 33 g/dL (ref 30.0–36.0)
RDW: 16.6 % — ABNORMAL HIGH (ref 11.5–15.5)

## 2010-06-18 LAB — PROTIME-INR: Prothrombin Time: 23 seconds — ABNORMAL HIGH (ref 11.6–15.2)

## 2010-06-18 LAB — MAGNESIUM: Magnesium: 2 mg/dL (ref 1.5–2.5)

## 2010-06-19 LAB — TYPE AND SCREEN
ABO/RH(D): O POS
Antibody Screen: NEGATIVE
Unit division: 0

## 2010-06-19 LAB — RENAL FUNCTION PANEL
BUN: 52 mg/dL — ABNORMAL HIGH (ref 6–23)
CO2: 29 mEq/L (ref 19–32)
Calcium: 8.8 mg/dL (ref 8.4–10.5)
Creatinine, Ser: 2.64 mg/dL — ABNORMAL HIGH (ref 0.4–1.2)
Glucose, Bld: 109 mg/dL — ABNORMAL HIGH (ref 70–99)
Phosphorus: 3.6 mg/dL (ref 2.3–4.6)
Sodium: 137 mEq/L (ref 135–145)

## 2010-06-19 LAB — CBC
HCT: 26.9 % — ABNORMAL LOW (ref 36.0–46.0)
MCH: 28.1 pg (ref 26.0–34.0)
MCHC: 33.1 g/dL (ref 30.0–36.0)
MCV: 84.9 fL (ref 78.0–100.0)
Platelets: 237 10*3/uL (ref 150–400)
RDW: 16.1 % — ABNORMAL HIGH (ref 11.5–15.5)
WBC: 15.5 10*3/uL — ABNORMAL HIGH (ref 4.0–10.5)

## 2010-06-20 ENCOUNTER — Inpatient Hospital Stay (HOSPITAL_COMMUNITY): Payer: Medicare Other

## 2010-06-20 LAB — CBC
HCT: 29.9 % — ABNORMAL LOW (ref 36.0–46.0)
Hemoglobin: 10 g/dL — ABNORMAL LOW (ref 12.0–15.0)
MCV: 84.7 fL (ref 78.0–100.0)
RBC: 3.53 MIL/uL — ABNORMAL LOW (ref 3.87–5.11)
RDW: 16.6 % — ABNORMAL HIGH (ref 11.5–15.5)
WBC: 13.8 10*3/uL — ABNORMAL HIGH (ref 4.0–10.5)

## 2010-06-20 LAB — COMPREHENSIVE METABOLIC PANEL
ALT: 132 U/L — ABNORMAL HIGH (ref 0–35)
Albumin: 2.5 g/dL — ABNORMAL LOW (ref 3.5–5.2)
Alkaline Phosphatase: 105 U/L (ref 39–117)
BUN: 58 mg/dL — ABNORMAL HIGH (ref 6–23)
Chloride: 100 mEq/L (ref 96–112)
Glucose, Bld: 89 mg/dL (ref 70–99)
Potassium: 3.2 mEq/L — ABNORMAL LOW (ref 3.5–5.1)
Sodium: 142 mEq/L (ref 135–145)
Total Bilirubin: 1.2 mg/dL (ref 0.3–1.2)
Total Protein: 5.6 g/dL — ABNORMAL LOW (ref 6.0–8.3)

## 2010-06-20 LAB — PROTIME-INR: INR: 1.52 — ABNORMAL HIGH (ref 0.00–1.49)

## 2010-06-21 LAB — BASIC METABOLIC PANEL
CO2: 36 mEq/L — ABNORMAL HIGH (ref 19–32)
Calcium: 9.5 mg/dL (ref 8.4–10.5)
Creatinine, Ser: 1.93 mg/dL — ABNORMAL HIGH (ref 0.4–1.2)
GFR calc Af Amer: 31 mL/min — ABNORMAL LOW (ref >60)
GFR calc non Af Amer: 25 mL/min — ABNORMAL LOW (ref >60)
Glucose, Bld: 94 mg/dL (ref 70–99)
Sodium: 144 mEq/L (ref 135–145)

## 2010-06-22 DIAGNOSIS — I4891 Unspecified atrial fibrillation: Secondary | ICD-10-CM

## 2010-06-22 DIAGNOSIS — I5022 Chronic systolic (congestive) heart failure: Secondary | ICD-10-CM

## 2010-06-23 LAB — CBC
HCT: 27.7 % — ABNORMAL LOW (ref 36.0–46.0)
MCHC: 32.9 g/dL (ref 30.0–36.0)
MCV: 86.3 fL (ref 78.0–100.0)
Platelets: 222 10*3/uL (ref 150–400)
RDW: 17.9 % — ABNORMAL HIGH (ref 11.5–15.5)

## 2010-06-23 LAB — BASIC METABOLIC PANEL
BUN: 55 mg/dL — ABNORMAL HIGH (ref 6–23)
Calcium: 9.7 mg/dL (ref 8.4–10.5)
Creatinine, Ser: 2.12 mg/dL — ABNORMAL HIGH (ref 0.4–1.2)
GFR calc non Af Amer: 23 mL/min — ABNORMAL LOW (ref >60)
Glucose, Bld: 94 mg/dL (ref 70–99)

## 2010-06-23 LAB — PROTIME-INR: Prothrombin Time: 14.8 seconds (ref 11.6–15.2)

## 2010-06-24 DIAGNOSIS — I495 Sick sinus syndrome: Secondary | ICD-10-CM

## 2010-06-24 LAB — CARDIAC PANEL(CRET KIN+CKTOT+MB+TROPI)
CK, MB: 1 ng/mL (ref 0.3–4.0)
Relative Index: INVALID (ref 0.0–2.5)
Total CK: 47 U/L (ref 7–177)
Troponin I: 0.05 ng/mL (ref 0.00–0.06)

## 2010-06-24 LAB — URINE MICROSCOPIC-ADD ON

## 2010-06-24 LAB — URINALYSIS, ROUTINE W REFLEX MICROSCOPIC
Bilirubin Urine: NEGATIVE
Glucose, UA: NEGATIVE mg/dL
Ketones, ur: NEGATIVE mg/dL
Nitrite: NEGATIVE
Specific Gravity, Urine: 1.011 (ref 1.005–1.030)
pH: 6 (ref 5.0–8.0)

## 2010-06-25 LAB — BASIC METABOLIC PANEL
Calcium: 10.3 mg/dL (ref 8.4–10.5)
Creatinine, Ser: 2.03 mg/dL — ABNORMAL HIGH (ref 0.4–1.2)
GFR calc Af Amer: 29 mL/min — ABNORMAL LOW (ref >60)
GFR calc non Af Amer: 24 mL/min — ABNORMAL LOW (ref >60)
Sodium: 147 mEq/L — ABNORMAL HIGH (ref 135–145)

## 2010-06-25 LAB — URINE CULTURE
Colony Count: NO GROWTH
Culture  Setup Time: 201203051453
Culture: NO GROWTH

## 2010-06-25 LAB — CARDIAC PANEL(CRET KIN+CKTOT+MB+TROPI)
Relative Index: INVALID (ref 0.0–2.5)
Total CK: 50 U/L (ref 7–177)

## 2010-06-26 LAB — BASIC METABOLIC PANEL
CO2: 37 mEq/L — ABNORMAL HIGH (ref 19–32)
Calcium: 9.5 mg/dL (ref 8.4–10.5)
Creatinine, Ser: 2.47 mg/dL — ABNORMAL HIGH (ref 0.4–1.2)
GFR calc non Af Amer: 19 mL/min — ABNORMAL LOW (ref >60)
Glucose, Bld: 117 mg/dL — ABNORMAL HIGH (ref 70–99)
Sodium: 143 mEq/L (ref 135–145)

## 2010-06-26 LAB — CBC
HCT: 27.7 % — ABNORMAL LOW (ref 36.0–46.0)
Hemoglobin: 8.9 g/dL — ABNORMAL LOW (ref 12.0–15.0)
MCH: 28.5 pg (ref 26.0–34.0)
MCHC: 32.1 g/dL (ref 30.0–36.0)
RDW: 19.1 % — ABNORMAL HIGH (ref 11.5–15.5)

## 2010-06-27 ENCOUNTER — Inpatient Hospital Stay (HOSPITAL_COMMUNITY): Payer: Medicare Other

## 2010-06-27 LAB — BASIC METABOLIC PANEL
CO2: 33 mEq/L — ABNORMAL HIGH (ref 19–32)
Calcium: 9.8 mg/dL (ref 8.4–10.5)
Chloride: 99 mEq/L (ref 96–112)
GFR calc Af Amer: 31 mL/min — ABNORMAL LOW (ref >60)
Glucose, Bld: 108 mg/dL — ABNORMAL HIGH (ref 70–99)
Potassium: 4.2 mEq/L (ref 3.5–5.1)
Sodium: 138 mEq/L (ref 135–145)

## 2010-06-27 LAB — FACTOR 5 LEIDEN

## 2010-06-28 LAB — ANTIPHOSPHOLIPID SYNDROME EVAL, BLD
Anticardiolipin IgA: 13 APL U/mL — ABNORMAL LOW (ref <22)
DRVVT: 53 secs — ABNORMAL HIGH (ref 36.2–44.3)
PTT Lupus Anticoagulant: 63.7 secs — ABNORMAL HIGH (ref 30.0–45.6)
PTTLA Confirmation: 7.3 secs (ref <8.0)
Phosphatydalserine, IgA: <20 U/mL (ref <20)
Phosphatydalserine, IgG: <10 U/mL (ref <10)
Phosphatydalserine, IgM: <25 U/mL (ref <25)
dRVVT Incubated 1:1 Mix: 43.1 secs (ref 36.2–44.3)

## 2010-06-28 LAB — BASIC METABOLIC PANEL
CO2: 35 mEq/L — ABNORMAL HIGH (ref 19–32)
Calcium: 9.9 mg/dL (ref 8.4–10.5)
GFR calc Af Amer: 27 mL/min — ABNORMAL LOW (ref >60)
Glucose, Bld: 95 mg/dL (ref 70–99)
Potassium: 3.6 mEq/L (ref 3.5–5.1)
Sodium: 140 mEq/L (ref 135–145)

## 2010-06-28 LAB — CBC
HCT: 31.2 % — ABNORMAL LOW (ref 36.0–46.0)
Hemoglobin: 10 g/dL — ABNORMAL LOW (ref 12.0–15.0)
MCH: 27.9 pg (ref 26.0–34.0)
MCV: 87.2 fL (ref 78.0–100.0)
Platelets: 334 10*3/uL (ref 150–400)
RBC: 3.58 MIL/uL — ABNORMAL LOW (ref 3.87–5.11)
WBC: 8.5 10*3/uL (ref 4.0–10.5)

## 2010-06-29 LAB — BASIC METABOLIC PANEL
CO2: 35 mEq/L — ABNORMAL HIGH (ref 19–32)
Calcium: 10.1 mg/dL (ref 8.4–10.5)
GFR calc Af Amer: 26 mL/min — ABNORMAL LOW (ref >60)
GFR calc non Af Amer: 21 mL/min — ABNORMAL LOW (ref >60)
Potassium: 3.6 mEq/L (ref 3.5–5.1)
Sodium: 141 mEq/L (ref 135–145)

## 2010-06-30 DIAGNOSIS — I359 Nonrheumatic aortic valve disorder, unspecified: Secondary | ICD-10-CM

## 2010-06-30 LAB — CBC
HCT: 29.8 % — ABNORMAL LOW (ref 36.0–46.0)
Hemoglobin: 9.4 g/dL — ABNORMAL LOW (ref 12.0–15.0)
MCH: 28 pg (ref 26.0–34.0)
RBC: 3.36 MIL/uL — ABNORMAL LOW (ref 3.87–5.11)

## 2010-06-30 LAB — BASIC METABOLIC PANEL
CO2: 34 mEq/L — ABNORMAL HIGH (ref 19–32)
Chloride: 100 mEq/L (ref 96–112)
GFR calc Af Amer: 25 mL/min — ABNORMAL LOW (ref >60)
Glucose, Bld: 94 mg/dL (ref 70–99)
Sodium: 139 mEq/L (ref 135–145)

## 2010-07-01 LAB — BASIC METABOLIC PANEL
CO2: 34 mEq/L — ABNORMAL HIGH (ref 19–32)
Chloride: 101 mEq/L (ref 96–112)
Glucose, Bld: 90 mg/dL (ref 70–99)
Potassium: 4 mEq/L (ref 3.5–5.1)
Sodium: 141 mEq/L (ref 135–145)

## 2010-07-01 LAB — CBC
HCT: 30.4 % — ABNORMAL LOW (ref 36.0–46.0)
Hemoglobin: 9.7 g/dL — ABNORMAL LOW (ref 12.0–15.0)
WBC: 5.9 10*3/uL (ref 4.0–10.5)

## 2010-07-01 LAB — PROTIME-INR: INR: 2.33 — ABNORMAL HIGH (ref 0.00–1.49)

## 2010-07-02 LAB — BASIC METABOLIC PANEL
CO2: 32 mEq/L (ref 19–32)
Calcium: 9.9 mg/dL (ref 8.4–10.5)
Creatinine, Ser: 2.34 mg/dL — ABNORMAL HIGH (ref 0.4–1.2)
GFR calc Af Amer: 24 mL/min — ABNORMAL LOW (ref >60)

## 2010-07-02 NOTE — Consult Note (Signed)
Summary: [facility] Consultation Report  [facility] Consultation Report   Imported By: Earl Many 06/28/2010 10:06:44  _____________________________________________________________________  External Attachment:    Type:   Image     Comment:   External Document

## 2010-07-03 ENCOUNTER — Encounter: Payer: Self-pay | Admitting: Internal Medicine

## 2010-07-03 LAB — PROTIME-INR: INR: 2.81 — ABNORMAL HIGH (ref 0.00–1.49)

## 2010-07-03 LAB — BASIC METABOLIC PANEL
BUN: 47 mg/dL — ABNORMAL HIGH (ref 6–23)
Chloride: 96 mEq/L (ref 96–112)
Creatinine, Ser: 2.61 mg/dL — ABNORMAL HIGH (ref 0.4–1.2)

## 2010-07-03 NOTE — Op Note (Signed)
NAMESHIORI, ADCOX              ACCOUNT NO.:  [id number]  MEDICAL RECORD NO.:  [id number]           PATIENT TYPE:  I  LOCATION:  4729                         FACILITY:  [facility]  PHYSICIAN:  Doylene Canning. Ladona Ridgel, MD    DATE OF BIRTH:  Mar 18, 1933  DATE OF PROCEDURE:  06/26/2010 DATE OF DISCHARGE:                              OPERATIVE REPORT   PROCEDURE PERFORMED:  Insertion of a dual-chamber pacemaker.  INDICATIONS:  Symptomatic bradycardia.  INTRODUCTION: 1. The patient is a very pleasant 77 year old woman who has a history     of symptomatic bradycardia and chronic renal insufficiency.  She     has had pauses of over 5 seconds despite not being on any sinus     node slowing drugs.  She is now referred for dual-chamber pacemaker     insertion.  She has AV conduction system disease as well with first-     degree AV block. 2. After informed consent was obtained, the patient was taken to the     diagnostic EP lab in fasting state.  After usual preparation and     draping, intravenous fentanyl and midazolam were given for     sedation; 30 mL of lidocaine was infiltrated into the right     infraclavicular region.  A 5-cm incision was carried out over this     region.  Electrocautery was utilized to dissect down into the     fascial plane.  The right subclavian vein was then punctured, and a     Medtronic model 5076, 52-cm active fixation pacing lead, serial     number [id number] was advanced into the right ventricle.  The     Medtronic model [id number], 45-cm active fixation pacing lead, serial     number [id number] was advanced into the right atrium.  Mapping was     carried out initially in the right ventricle.  At the final site,     the R-waves measured around 6 mV.  Other sites, the R-waves were     reduced.  The threshold was less than 1 volt at 0.5 milliseconds     with the lead actively fixed, and a prominent injury current was     present; 10-volt pacing did not stimulate the  diaphragm, and the     initial impedance was around 800 ohms.  With the ventricular lead     in satisfactory position, attention was then turned to placement of     the atrial lead, which was placed in the anterolateral portion of     the right atrium where P-waves measured 2 mV, and the pacing     threshold was around 1.5 V at 0.5 milliseconds; 10-volt pacing did     not stimulate the diaphragm.  Again, there was a prominent injury     current with active fixation of the lead.  With both the atrial and     ventricular leads in satisfactory position, they were secured to     the subpectoralis fascia with a figure-of-eight silk suture.  The     sewing sleeve was secured with a silk  suture.  Electrocautery was     then utilized to make a subcutaneous pocket.  Antibiotic irrigation     was utilized to irrigate the pocket.  Electrocautery was utilized     to assure hemostasis.  The Medtronic Cynthia dual-chamber     pacemaker, serial number [id number] was connected to the atrial and     ventricular leads and placed back in the subcutaneous pocket where     they were secured with silk suture.  The pocket was again irrigated     with antibiotic irrigation and the incision closed with 2-0 and 3-0     Vicryl.  Benzoin and Steri-Strips were painted on the skin, a     pressure dressing was applied, and the patient was returned to her     room in satisfactory condition. 3. Complications.  There were no immediate procedure complications. 4. Results demonstrate successful insertion of a dual-chamber     Medtronic pacemaker in a patient with symptomatic bradycardia and     AV conduction system disease.     Doylene Canning. Ladona Ridgel, MD     GWT/MEDQ  D:  06/26/2010  T:  06/27/2010  Job:  [id number]  Electronically Signed by Lewayne Bunting MD on 07/03/2010 04:41:16 PM

## 2010-07-04 LAB — ALDOSTERONE + RENIN ACTIVITY W/ RATIO: Aldosterone: <1 ng/dL

## 2010-07-05 LAB — BASIC METABOLIC PANEL
Chloride: 110 mEq/L (ref 96–112)
Creatinine, Ser: 4.31 mg/dL — ABNORMAL HIGH (ref 0.4–1.2)
GFR calc Af Amer: 12 mL/min — ABNORMAL LOW (ref >60)
Potassium: 4.5 mEq/L (ref 3.5–5.1)

## 2010-07-05 LAB — GLUCOSE, CAPILLARY
Glucose-Capillary: 101 mg/dL — ABNORMAL HIGH (ref 70–99)
Glucose-Capillary: 102 mg/dL — ABNORMAL HIGH (ref 70–99)
Glucose-Capillary: 110 mg/dL — ABNORMAL HIGH (ref 70–99)
Glucose-Capillary: 111 mg/dL — ABNORMAL HIGH (ref 70–99)
Glucose-Capillary: 129 mg/dL — ABNORMAL HIGH (ref 70–99)
Glucose-Capillary: 79 mg/dL (ref 70–99)
Glucose-Capillary: 81 mg/dL (ref 70–99)
Glucose-Capillary: 83 mg/dL (ref 70–99)
Glucose-Capillary: 90 mg/dL (ref 70–99)
Glucose-Capillary: 91 mg/dL (ref 70–99)
Glucose-Capillary: 92 mg/dL (ref 70–99)
Glucose-Capillary: 98 mg/dL (ref 70–99)

## 2010-07-05 LAB — COMPREHENSIVE METABOLIC PANEL
ALT: 21 U/L (ref 0–35)
ALT: 23 U/L (ref 0–35)
ALT: 28 U/L (ref 0–35)
ALT: 28 U/L (ref 0–35)
ALT: 32 U/L (ref 0–35)
AST: 25 U/L (ref 0–37)
AST: 26 U/L (ref 0–37)
AST: 33 U/L (ref 0–37)
AST: 38 U/L — ABNORMAL HIGH (ref 0–37)
Albumin: 2.8 g/dL — ABNORMAL LOW (ref 3.5–5.2)
Albumin: 2.9 g/dL — ABNORMAL LOW (ref 3.5–5.2)
Albumin: 2.9 g/dL — ABNORMAL LOW (ref 3.5–5.2)
Albumin: 3.2 g/dL — ABNORMAL LOW (ref 3.5–5.2)
Albumin: 3.4 g/dL — ABNORMAL LOW (ref 3.5–5.2)
Alkaline Phosphatase: 34 U/L — ABNORMAL LOW (ref 39–117)
Alkaline Phosphatase: 37 U/L — ABNORMAL LOW (ref 39–117)
Alkaline Phosphatase: 43 U/L (ref 39–117)
Alkaline Phosphatase: 50 U/L (ref 39–117)
Alkaline Phosphatase: 52 U/L (ref 39–117)
BUN: 34 mg/dL — ABNORMAL HIGH (ref 6–23)
BUN: 37 mg/dL — ABNORMAL HIGH (ref 6–23)
BUN: 80 mg/dL — ABNORMAL HIGH (ref 6–23)
BUN: 85 mg/dL — ABNORMAL HIGH (ref 6–23)
CO2: 25 mEq/L (ref 19–32)
CO2: 26 mEq/L (ref 19–32)
Calcium: 8.5 mg/dL (ref 8.4–10.5)
Calcium: 8.7 mg/dL (ref 8.4–10.5)
Chloride: 109 mEq/L (ref 96–112)
Chloride: 110 mEq/L (ref 96–112)
Chloride: 111 mEq/L (ref 96–112)
Chloride: 112 mEq/L (ref 96–112)
Chloride: 113 mEq/L — ABNORMAL HIGH (ref 96–112)
Creatinine, Ser: 2.38 mg/dL — ABNORMAL HIGH (ref 0.4–1.2)
Creatinine, Ser: 2.52 mg/dL — ABNORMAL HIGH (ref 0.4–1.2)
GFR calc Af Amer: 11 mL/min — ABNORMAL LOW (ref >60)
GFR calc Af Amer: 17 mL/min — ABNORMAL LOW (ref >60)
GFR calc Af Amer: 23 mL/min — ABNORMAL LOW (ref >60)
GFR calc Af Amer: 24 mL/min — ABNORMAL LOW (ref >60)
GFR calc non Af Amer: 19 mL/min — ABNORMAL LOW (ref >60)
GFR calc non Af Amer: 20 mL/min — ABNORMAL LOW (ref >60)
Glucose, Bld: 103 mg/dL — ABNORMAL HIGH (ref 70–99)
Glucose, Bld: 93 mg/dL (ref 70–99)
Potassium: 4.3 mEq/L (ref 3.5–5.1)
Potassium: 4.5 mEq/L (ref 3.5–5.1)
Potassium: 4.7 mEq/L (ref 3.5–5.1)
Potassium: 5 mEq/L (ref 3.5–5.1)
Potassium: 5.4 mEq/L — ABNORMAL HIGH (ref 3.5–5.1)
Sodium: 142 mEq/L (ref 135–145)
Sodium: 142 mEq/L (ref 135–145)
Sodium: 144 mEq/L (ref 135–145)
Sodium: 146 mEq/L — ABNORMAL HIGH (ref 135–145)
Total Bilirubin: 0.3 mg/dL (ref 0.3–1.2)
Total Bilirubin: 0.4 mg/dL (ref 0.3–1.2)
Total Bilirubin: 0.4 mg/dL (ref 0.3–1.2)
Total Bilirubin: 0.4 mg/dL (ref 0.3–1.2)
Total Bilirubin: 0.5 mg/dL (ref 0.3–1.2)
Total Protein: 5 g/dL — ABNORMAL LOW (ref 6.0–8.3)
Total Protein: 5.4 g/dL — ABNORMAL LOW (ref 6.0–8.3)
Total Protein: 6 g/dL (ref 6.0–8.3)

## 2010-07-05 LAB — CBC
HCT: 26.7 % — ABNORMAL LOW (ref 36.0–46.0)
HCT: 27.1 % — ABNORMAL LOW (ref 36.0–46.0)
HCT: 28.7 % — ABNORMAL LOW (ref 36.0–46.0)
Hemoglobin: 8.8 g/dL — ABNORMAL LOW (ref 12.0–15.0)
Hemoglobin: 8.9 g/dL — ABNORMAL LOW (ref 12.0–15.0)
MCH: 29.2 pg (ref 26.0–34.0)
MCH: 29.8 pg (ref 26.0–34.0)
MCHC: 32.8 g/dL (ref 30.0–36.0)
MCHC: 33 g/dL (ref 30.0–36.0)
MCV: 88.7 fL (ref 78.0–100.0)
MCV: 89.1 fL (ref 78.0–100.0)
MCV: 90.6 fL (ref 78.0–100.0)
MCV: 92 fL (ref 78.0–100.0)
Platelets: 134 10*3/uL — ABNORMAL LOW (ref 150–400)
Platelets: 137 10*3/uL — ABNORMAL LOW (ref 150–400)
Platelets: 156 10*3/uL (ref 150–400)
Platelets: 193 10*3/uL (ref 150–400)
RBC: 2.99 MIL/uL — ABNORMAL LOW (ref 3.87–5.11)
RBC: 3.01 MIL/uL — ABNORMAL LOW (ref 3.87–5.11)
RBC: 3.22 MIL/uL — ABNORMAL LOW (ref 3.87–5.11)
RBC: 3.64 MIL/uL — ABNORMAL LOW (ref 3.87–5.11)
RDW: 15.5 % (ref 11.5–15.5)
RDW: 15.7 % — ABNORMAL HIGH (ref 11.5–15.5)
RDW: 15.9 % — ABNORMAL HIGH (ref 11.5–15.5)
WBC: 4.1 10*3/uL (ref 4.0–10.5)
WBC: 4.1 10*3/uL (ref 4.0–10.5)
WBC: 4.9 10*3/uL (ref 4.0–10.5)
WBC: 5.1 10*3/uL (ref 4.0–10.5)

## 2010-07-05 LAB — PROTIME-INR
INR: 1.12 (ref 0.00–1.49)
INR: 1.2 (ref 0.00–1.49)
Prothrombin Time: 14.6 seconds (ref 11.6–15.2)
Prothrombin Time: 15.4 seconds — ABNORMAL HIGH (ref 11.6–15.2)

## 2010-07-05 LAB — URINALYSIS, ROUTINE W REFLEX MICROSCOPIC
Bilirubin Urine: NEGATIVE
Glucose, UA: NEGATIVE mg/dL
Hgb urine dipstick: NEGATIVE
Ketones, ur: NEGATIVE mg/dL
pH: 5 (ref 5.0–8.0)

## 2010-07-09 NOTE — Miscellaneous (Signed)
Summary: Device preload  Clinical Lists Changes  Observations: Added new observation of PPM INDICATN: CHB (07/03/2010 10:00) Added new observation of MAGNET RTE: BOL 85 ERI 65 (07/03/2010 10:00) Added new observation of PPMLEADSTAT2: active (07/03/2010 10:00) Added new observation of PPMLEADSER2: [id number] (07/03/2010 10:00) Added new observation of PPMLEADMOD2: 5076  (07/03/2010 10:00) Added new observation of PPMLEADLOC2: RV  (07/03/2010 10:00) Added new observation of PPMLEADSTAT1: active  (07/03/2010 10:00) Added new observation of PPMLEADSER1: [id number]  (07/03/2010 10:00) Added new observation of PPMLEADMOD1: 5076  (07/03/2010 10:00) Added new observation of PPMLEADLOC1: RA  (07/03/2010 10:00) Added new observation of PPM IMP MD: Lewayne Bunting, MD  (07/03/2010 10:00) Added new observation of PPMLEADDOI2: 06/26/2010  (07/03/2010 10:00) Added new observation of PPMLEADDOI1: 06/26/2010  (07/03/2010 10:00) Added new observation of PPM DOI: 06/26/2010  (07/03/2010 10:00) Added new observation of PPM SERL#: [id number]  (07/03/2010 10:00) Added new observation of PPM MODL#: [id number]  (07/03/2010 84:13) Added new observation of PACEMAKERMFG: Medtronic  (07/03/2010 10:00) Added new observation of PACEMAKER MD: Lewayne Bunting, MD  (07/03/2010 10:00)      PPM Specifications Following MD:  Lewayne Bunting, MD     PPM Vendor:  Medtronic     PPM Model Number:  [id number]     PPM Serial Number:  [id number] PPM DOI:  06/26/2010     PPM Implanting MD:  Lewayne Bunting, MD  Lead 1    Location: RA     DOI: 06/26/2010     Model #: [id number]     Serial #: [id number]     Status: active Lead 2    Location: RV     DOI: 06/26/2010     Model #: [id number]     Serial #: [id number]     Status: active  Magnet Response Rate:  BOL 85 ERI 65  Indications:  CHB

## 2010-07-11 ENCOUNTER — Other Ambulatory Visit: Payer: Self-pay | Admitting: Internal Medicine

## 2010-07-11 ENCOUNTER — Ambulatory Visit (INDEPENDENT_AMBULATORY_CARE_PROVIDER_SITE_OTHER): Payer: Medicare Other | Admitting: *Deleted

## 2010-07-11 DIAGNOSIS — I498 Other specified cardiac arrhythmias: Secondary | ICD-10-CM

## 2010-07-11 DIAGNOSIS — R001 Bradycardia, unspecified: Secondary | ICD-10-CM

## 2010-07-11 DIAGNOSIS — I440 Atrioventricular block, first degree: Secondary | ICD-10-CM

## 2010-07-11 NOTE — Consult Note (Signed)
NAMESAMON, DISHNER              ACCOUNT NO.:  [id number]  MEDICAL RECORD NO.:  [id number]           PATIENT TYPE:  I  LOCATION:  4729                         FACILITY:  [facility]  PHYSICIAN:  Duke Salvia, MD, FACCDATE OF BIRTH:  1932-08-22  DATE OF CONSULTATION:  06/24/2010 DATE OF DISCHARGE:                                CONSULTATION   Thank you much for asking [name] on consultation because of sinus node dysfunction.  The patient is a 77 year old woman with a complex past history notable for atrial fibrillation dating back to many years for which she has been taking amiodarone, pulmonary embolism, pulmonary emboli for which she has been taking Coumadin for many years and despite which had recurrent bilateral pulmonary emboli in 2005 prompting the insertion of an IVC filter, and nonischemic cardiomyopathy with previously depressed left ventricular function more recently and normal left ventricular function.  She presented to the hospital about 10 days ago with syncope.  She had an episode of syncope 6 weeks previously.  Both of these episodes were relatively abrupt in onset, though they occurred in the context of having had progressive dyspnea on exertion for some time, particularly the more recent episode.  There had been no peripheral edema.  There had been no chest pain.  The most recent episode of syncope occurred while she was standing in the kitchen, washing her dishes, she fell to the floor.  She was unable to get up from the floor.  Her physician who says was ended up stopping by around that time and EMS was called and she was transported to the hospital where she was found to be bradycardic with heart rates in the 30s with a junctional escape rate.  Blood pressures were in the 80s and 90s.  Blood work was notable for creatinine of 5.2 and potassium 5.2.  BNP was 2000 and hemoglobin was 8.8.  Troponins were normal.  Since being in the hospital, her  amiodarone and carvedilol were discontinued.  She needed sympathomimetic support for heart rate.  This was subsequently discontinued.  Heart rate slowed, apparently requiring reinitiation of sympathomimetics and then has been able to tolerate being off sympathomimetics for the last few days.  Amiodarone was reinitiated a couple of days ago and the patient had significantly more bradycardia yesterday.  Echocardiogram has demonstrated normal left ventricular function with aortic stenosis described as moderate with a mean gradient of 30.  She had undergone a catheterization in 2003 for acute congestive heart failure.  At that time, she was found to have nonobstructive coronary artery disease.  Her prior syncopal episode occurring 6 weeks ago was also abrupt in onset.  There was a prodrome of less than 5 seconds.  Notably, on this occasion, she is able to get up off the floor almost immediately thereafter.  She has a remote history of recurrent syncope.  Thromboembolic risk factors are notable for age - 2, gender - 2, hypertension - 1, and prior stroke - 2.  Her past medical history in addition to above is notable for: 1. Anemia. 2. Treated hypothyroidism. 3. Dementia. 4. Migraine headaches.  5. GE reflux disease. 6. Dyslipidemia.  PAST SURGICAL HISTORY:  Notable for: 1. Appendectomy. 2. Bunion surgery. 3. Bilateral cataracts. 4. Appendectomy. 5. Hysterectomy. 6. Back surgery. 7. Left knee surgery. 8. Nasal surgery. 9. Ulnar nerve surgery. 10.TrapEase permanent IVC filter in 2005.  MEDICATIONS ON ADMISSION:  Namenda, Aricept, aspirin, amlodipine, amiodarone, alendronate, carvedilol 25 t.i.d., Coumadin, Lasix, hydralazine 25 t.i.d., Imdur 30 b.i.d., Lyrica 50, metronidazole, nitroglycerin p.r.n., Pravachol, and saline.  FAMILY HISTORY:  Notable for ovarian cancer and epilepsy.  SOCIAL HISTORY:  The patient has 3 children.  She is retired and divorced.  She does not use  alcohol or tobacco.  ALLERGIES:  NUBAIN - cardiac arrest.  REVIEW OF SYSTEMS:  Notable for some intermittent chest pain, some orthopnea, and shortness of breath.  Otherwise, negative for fevers, chills, sweats, headaches, abdominal pain, nausea, or diarrhea.  PHYSICAL EXAMINATION:  GENERAL:  She is an elderly Caucasian female appearing her stated age of 77.  She is in no acute distress. VITAL SIGNS:  Blood pressure was 178/70.  Pulse was 80.  Respirations were 18. HEENT:  Normal. NECK:  Her neck veins were 7-8 cm.  Her carotids were delayed, but brisk.  There is a left IJ line. BACK:  Moderate kyphosis. LUNGS:  Clear. HEART:  Heart sounds were regular with a 3/6 systolic murmur.  S2 was single.  No diastolic murmur was appreciated. ABDOMEN:  Soft with active bowel sounds without midline pulsation or hepatomegaly. EXTREMITIES:  Femoral pulses were trace.  Distal pulses were intact. There is no clubbing, cyanosis, or edema.  NEUROLOGICAL:  Grossly normal apart from her being hard-of-hearing. SKIN:  Warm and dry.  Electrocardiogram dated yesterday morning demonstrated sinus rhythm at a rate of 39 with variable RR intervals ranging from 1400-1800 milliseconds.  There were profound ST changes in the anterior precordium which are different from previously.  The QRS duration was 122 milliseconds which is stable.  Review of old electrocardiograms demonstrates that the T-wave inversions anteriorly are new from June 20, 2010.  LABORATORY DATA:  Today, notable for sodium of 145, potassium of 3.1, bicarb of 38, creatinine of 2.12, BUN 55, hemoglobin of 9.1, and INR of 1.3.  IMPRESSION: 1. Sinus node dysfunction, question of related to amiodarone, Coreg,     or one of her dementia drugs. 2. History of syncope, probably neurally mediated, but the abruptness     of onset also suggest an arrhythmia. 3. Hypertension - poorly controlled with hypernatremia and     hypokalemia, question of  hyperaldosteronism. 4. Recurrent pulmonary emboli with and without therapeutic INR, status     post inferior vena cava filter. 5. History of atrial fibrillation - paroxysmal. 6. Thromboembolic risk factors notable for age - 2, gender - 1, CVA -     2, hypertension - 1 for CHADS-VASc score of 6 and a CHADS score of     4. 7. Acute on chronic renal insufficiency. 8. Moderate aortic stenosis. 9. Left internal jugular line. 10.Abnormal ECG.  Ms. Rebuck had recurrent bradycardia in the setting of worsening electrocardiographic abnormalities yesterday.  This raises the specter of inferior wall ischemia as a contributing factor.  Her creatinine precludes catheterization, so Myoview scanning may be our best way to elucidate this.  I do not think the reinitiation of her amiodarone a couple of days ago was responsible for this.  The fact that she was relatively stable on her dementia drugs and it make them less likely culprit.  The fact that she  also has aortic stenosis which may well needs to be repaired in the future to further enhance and likely need for future pacing in this lady with a 2-way indication with sinus node dysfunction associated with symptoms that may or may not be related to medications.  Her hypertension is poorly controlled.  With her hypokalemia, I wonder whether it is worth checking a PRA and aldosterone concentration. Further, the issue of her recurrent pulmonary emboli despite anticoagulation raises the specter of a prothrombotic disorder and screening her for this I think is appropriate.  As relates to her pacer, it is easier to do this in the setting of coumadinization and it is in the setting of heparin.  With her history of recurrent pulmonary emboli despite therapeutic anticoagulation and prior strokes, I think anticoagulation in the short-term is the more poor tissue and hence we would resume heparin and once her INR is therapeutic we can place her pacemaker  at that time.  We will follow with you.  Thank you for the consultation.  We would also try and get the left IJ out.     Duke Salvia, MD, [facility]     SCK/MEDQ  D:  06/24/2010  T:  06/25/2010  Job:  [id number]  Electronically Signed by Sherryl Manges MD [facility] on 07/11/2010 08:35:38 AM

## 2010-07-31 LAB — DIFFERENTIAL
Basophils Relative: 0 % (ref 0–1)
Eosinophils Absolute: 0.1 10*3/uL (ref 0.0–0.7)
Eosinophils Relative: 1 % (ref 0–5)
Neutrophils Relative %: 66 % (ref 43–77)

## 2010-07-31 LAB — CBC
HCT: 32.1 % — ABNORMAL LOW (ref 36.0–46.0)
MCHC: 34.2 g/dL (ref 30.0–36.0)
MCV: 92.8 fL (ref 78.0–100.0)
Platelets: 256 10*3/uL (ref 150–400)
RDW: 14.7 % (ref 11.5–15.5)

## 2010-07-31 LAB — PROTIME-INR
INR: 1.8 — ABNORMAL HIGH (ref 0.00–1.49)
Prothrombin Time: 22.1 seconds — ABNORMAL HIGH (ref 11.6–15.2)

## 2010-08-09 ENCOUNTER — Telehealth: Payer: Self-pay | Admitting: Internal Medicine

## 2010-08-09 NOTE — Telephone Encounter (Signed)
Pt does not need to keep apt with Dr Graciela Husbands

## 2010-08-09 NOTE — Telephone Encounter (Signed)
PT SON WANTS TO KNOW IF SHE NEEDS TO KEEP HER APPT WITH DR Graciela Husbands.

## 2010-08-14 NOTE — Discharge Summary (Signed)
[name], RENZULLI NO.:  [id number]  MEDICAL RECORD NO.:  [id number]           PATIENT TYPE:  LOCATION:                                 FACILITY:  PHYSICIAN:  Duke Salvia, MD, FACCDATE OF BIRTH:  Dec 20, 1932  DATE OF ADMISSION:  06/15/2010 DATE OF DISCHARGE:  07/02/2010                        DISCHARGE SUMMARY - REFERRING   PRIMARY CARE PHYSICIAN:  Massie Maroon, MD.  PRIMARY CARDIOLOGIST:  Duke Salvia, MD, [facility].  NEPHROLOGIST:  Zetta Bills, MD.  PRIMARY DIAGNOSES: 1. Syncope. 2. Symptomatic bradycardia. 3. Acute-on-chronic renal failure.  SECONDARY DIAGNOSES: 1. Chronic low back pain. 2. History of nonischemic cardiomyopathy. 3. Moderate aortic stenosis. 4. Hypertension. 5. Hyperlipidemia. 6. Hypothyroidism. 7. Atrial fibrillation. 8. Chronic kidney disease. 9. History of stroke. 10.History of pulmonary embolism -- as noted, the patient had a     recurrent bilateral pulmonary emboli in 2005, on warfarin therapy     and at which time she underwent implantation of IVC filter. 11.Anemia. 12.Dementia. 13.Gastroesophageal reflux disease.  ALLERGIES:  The patient is allergic to NUBAIN, CODEINE, DARVON, AND DARVOCET.  PROCEDURE IN THIS ADMISSION: 1. Hemodialysis on June 13, 2010. 2. Implantation of a dual chamber pacemaker on June 26, 2010 by Dr.     Ladona Ridgel.  The patient received a Medtronic model number [id number]     pacemaker with model number 5076 right atrial and right ventricular     lead. 3. Chest x-ray on June 27, 2010 demonstrated no pneumothorax, status     post pacemaker implant. 4. Echocardiogram on June 16, 2010 demonstrated an ejection     fraction of 50%.  The patient had moderate aortic stenosis.  There     was mild mitral valve regurgitation.  The left atrium was mildly     dilated and right atrium was mildly-to-moderately dilated.  No     other defect or PFO was identified.  BRIEF HISTORY OF PRESENT ILLNESS:  The  patient is a 77 year old female with a history of nonobstructive coronary artery disease, diagnosed with a catheterization in 2003, congestive heart failure with most recent ejection fraction of 50%, atrial fibrillation managed with Coumadin and amiodarone, chronic renal insufficiency, and moderate aortic stenosis. She was in her usual state of health until about 4-5 days preceding admission.  At that time, she has become progressively weak with dyspnea on exertion and class III heart failure symptoms.  She also experienced intermittent chest pressure.  On the day of admission, she fell and this seemed to be related to syncope spell.  EMS was called and she was transported to emergency room.  On arrival, she was noted to be significantly bradycardic with heart rate in the 30s with junctional escape rhythm noted.  Systolic blood pressure was in the 80s and 90s. She was treated in the emergency room with fluid bolus as well as atropine.  Because of these things, she was admitted to the ICU.  HOSPITAL COURSE:  Laboratory data on admission demonstrated a potassium of 5.2.  This was felt not to be the cause of her bradycardia.  Also in her admission laboratory data, her creatinine was  noted to be 5.2 with BUN of 97.  The patient also had pulmonary edema.  Therefore, a renal consult was requested.  Dr. Arlean Hopping with renal service evaluated the patient and recommended increasing the patient's Lasix and Kayexalate to help with her potassium level and also to try to diurese her.  However, the patient did not have significant diuresis with Lasix, so therefore underwent one treatment of acute hemodialysis on June 16, 2010. After dialysis, her renal function returned to baseline with her BUN at 48 and creatinine of 3.34 at the time of discharge.  The patient also had bradycardia on admission.  This resolved with dopamine.  Her dopamine was discontinued over the course of her admission and she  was maintaining sinus rhythm with rates in the 60s and 70s.  However, the patient had recurrent bradycardia with periods of sinus arrest.  Because of these things, she was evaluated by Dr. Graciela Husbands of electrophysiology on June 24, 2010.  Her syncope was felt to be neurally mediated at that time with the patient also have abrupt onset of characteristic of her spells that are suggestive of dysrhythmia. Notably, her amiodarone and carvedilol has been held with this admission. It was unlikely that resuming these medications was cause of her bradycardia.  Dr. Graciela Husbands recommended resuming the patient's warfarin to keep her therapeutic in light of her previous pulmonary emboli and placement of IVC filter. Once this was accomplished, she underwent pacemaker implantation to treat her symptomatic bradycardia.  Pacemaker implantation was  carried out by Dr. Ladona Ridgel with details noted as above.  She was then continued to be monitored on telemetry, which demonstrated atrial pacing.  The patient was significantly deconditioned after her prolonged stay at hospital.  She was evaluated by physical therapy who recommended a short stay at skilled nursing facility upon discharge.  Dr. Graciela Husbands evaluated the patient on July 02, 2010 and considered stable for discharge when a skilled nursing facility bed was available.  LABORATORY DATA:  On discharge, the patient's potassium 4.1, BUN 48, creatinine 3.34.  White blood count 5.9, hemoglobin 9.7, hematocrit 34. INR 2.33.  The patient was screened for factor V, mutation was negative. She was also screened prothrombin to gene mutation, this was also negative.  Her antiphospholipid panel came back normal.  Protein C result was 60 which was low normal, protein S was normal at 115.  Her aldosterone and renin levels were still pending at the time of discharge.  FOLLOWUP APPOINTMENT: 1. [facility] on July 11, 2010 at 11:00 a.m. 2. Dr. Graciela Husbands on August 15, 2010 at 9:15 a.m. -- the patient     previously followed with Dr. Lucas Mallow now needs a primary     cardiologist. 3. Dr. Pearson Grippe as scheduled. 4. Dr. Allena Katz on July 22, 2010 at 9:15 a.m. 5. Dr. Elmyra Ricks office in 1 week for INR check.  DISCHARGE INSTRUCTIONS: 1. Increase activity as tolerated. 2. Follow low-sodium heart-healthy diet. 3. See supplemental device discharge instructions for wound care and     arm mobility.  DISCHARGE MEDICATIONS: 1. PhosLo 667 mg 3 times a day before meals. 2. Aranesp 100 mcg subcutaneously weekly. 3. Multivitamin daily. 4. Torsemide 10 mg daily. 5. Amiodarone 200 mg one-half tablet daily. 6. Carvedilol 6.25 mg one tablet twice daily. 7. Fosamax 70 mg one tablet weekly. 8. Aricept 10 mg daily. 9. Aspirin 81 mg daily. 10.Calcitriol 0.25 mcg daily. 11.Warfarin 3 mg, take one tablet on Tuesday-Thursday-Saturday and one-     half  tablet on the other days. 12.Hydrocodone/APAP 10/325 mg one tablet every 6 hours as needed for     pain. 13.Levothyroxine 100 mcg daily. 14.Lyrica 75 mg twice daily. 15.Namenda 10 mg twice daily. 16.Nexium 40 mg daily. 17.Nitroglycerin sublingual 0.4 mg as needed for chest pain. 18.Pravachol 40 mg daily. 19.Vitamin D 1000 units daily. 20.Of note, the patient's furosemide, Imdur, hydralazine,     spironolactone, and amlodipine have been discontinued.  VITAL SIGNS ON DISCHARGE:  Temperature 96.4, pulse 55, respirations 18, blood pressure 126/54.  The patient's oxygen saturations are 98% on 2 L and she weighs 61 kg.  DISPOSITION:  The patient was seen and examined by Dr. Graciela Husbands on July 02, 2010, and considered stable for discharge.  DURATION DISCHARGE ENCOUNTER:  50 minutes.     Gypsy Balsam, RN,BSN   ______________________________ Duke Salvia, MD, [facility]    AS/MEDQ  D:  07/02/2010  T:  07/02/2010  Job:  [id number]  cc:   Massie Maroon, MD Zetta Bills, MD  Electronically Signed by Gypsy Balsam RNBSN on 07/16/2010  07:44:52 AM Electronically Signed by Sherryl Manges MD [facility] on 08/14/2010 08:46:58 AM

## 2010-08-15 ENCOUNTER — Encounter: Payer: Self-pay | Admitting: Internal Medicine

## 2010-08-23 ENCOUNTER — Encounter: Payer: Self-pay | Admitting: Internal Medicine

## 2010-09-03 NOTE — Consult Note (Signed)
[name], [name]              ACCOUNT NO.:  [id number]   MEDICAL RECORD NO.:  [id number]          PATIENT TYPE:  EMS   LOCATION:  MAJO                         FACILITY:  [facility]   PHYSICIAN:  Karol T. Lazarus Salines, M.D. DATE OF BIRTH:  1932-06-06   DATE OF CONSULTATION:  07/22/2008  DATE OF DISCHARGE:  07/22/2008                                 CONSULTATION   CHIEF COMPLAINT:  Nosebleed.   HISTORY OF PRESENT ILLNESS:  This is a 77 year old white female with a  past history of nosebleed and a known septal perforation.  She underwent  attempted repair by Dr. Pollyann Kennedy a couple of different times  unsuccessfully.  She has a residual known septal perforation.  She uses  nasal hygiene measures minimally but does not have a humidifier in her  house.  She is on Coumadin for recurrent pulmonary emboli.  Today, INR  is 1.8.  Around midnight this morning, she developed bleeding initially  from the right side of her nose and then later from both sides.  She was  evaluated in the emergency room with significant persistent bleeding and  no site identified.  ENT was called in for assistance.  She does have  hypertension, which is incompletely controlled.  She has not had a  recent upper respiratory infection or in other ways traumatized the  nose.  She is not bleeding inappropriately from any other sites.   PHYSICAL EXAMINATION:  GENERAL:  This is a thin, older white female in  no obvious distress.  She is wiping and blowing small amounts of blood  from her nose.  NEUROLOGIC:  Mental status is sharp.  She hears well, in conversational  speech.  Voice is clear and respirations predominately through the  mouth.  Cranial nerves grossly intact.  HEAD:  Atraumatic.  NECK:  Supple.  Unremarkable.  EARS:  She has some dried blood in the right ear canal with an aerated  drum.  Left ear canal and drum are normal.  MOUTH:  Oral cavity is moist with an upper plate and no active bleeding  in the pharynx and no  lesions in the mouth or pharynx.  NOSE:  Anterior nose is full of clots.  After having her blood the clots  out, she has a 2.5-cm smooth-edged septal perforation.  At the posterior-  superior corner on the right-sided septal mucosa, there is an apparent  granuloma which is bleeding.  No other bleeding sites identified.   IMPRESSION:  Right septal epistaxis.  Septal perforation.  Coumadin  therapy.   PLAN:  With informed consent, I applied 4% Xylocaine with Neo-Synephrine  nasal spray 50:50 mix on cotton pledgets to the inferior of her nose for  topical hemostasis and anesthesia.  After allowing several minutes for  this to take effect, I silver nitrate cauterized the granuloma on her  right septum with good tolerance and good control of the bleeding.  Small amount of bacitracin ointment was applied.   I emphasized to increase nasal hygiene measures for the next couple of  weeks and then back off.  Although in general, this nose will  require a  fair bit of attention, especially in the dry winter weather.  She will  come back to see Dr. Pollyann Kennedy on an as-needed basis.  She understands and  agrees with discussion and plans.      Gloris Manchester. Lazarus Salines, M.D.  Electronically Signed     KTW/MEDQ  D:  07/22/2008  T:  07/23/2008  Job:  [id number]   cc:   Massie Maroon, MD

## 2010-09-03 NOTE — Op Note (Signed)
[name], LANGLINAIS              ACCOUNT NO.:  [id number]   MEDICAL RECORD NO.:  [id number]          PATIENT TYPE:  AMB   LOCATION:  ENDO                         [facility]ACILITY[facility]   PHYSICIAN:  Georgiana Spinner, M.D.    DATE OF BIRTH:  14-Feb-1933   DATE OF PROCEDURE:  DATE OF DISCHARGE:                               OPERATIVE REPORT   PROCEDURE:  Colonoscopy.   INDICATIONS:  Colon polyps, colon cancer screening, constipation.   ANESTHESIA:  Fentanyl 25 mcg, Versed 2 mg.   PROCEDURE:  With the patient mildly sedated in the left lateral  decubitus position, the Pentax videoscopic pediatric colonoscope was  inserted in the rectum and passed through a diverticular-filled,  tortuous sigmoid colon with pressure applied to reach the cecum,  identified by the ileocecal valve and the base of the cecum.  The prep,  especially in the right colon, was somewhat suboptimal in that there was  solid stool here that we washed and suctioned as best we could.  No  gross lesions were seen in the cecum but there was stool that blocked a  complete view, but from this point the colonoscope was slowly withdrawn,  taking circumferential views of the colonic mucosa.  We withdrew all the  way to the rectum, stopping to suction fecal debris that was more  liquid, until we reached the rectum and stopping only then at the  hepatic flexure, where a polyp was seen, photographed, and removed using  snare cautery technique at setting of 20/150 blended current.  The polyp  was retained by suctioning it through the endoscope into a tissue trap.  In the rectum the endoscope was placed in retroflexed view to view the  anal canal from above.  The endoscope was straightened and withdrawn.  The patient's vital signs, pulse oximeter remained stable.  The patient  tolerated the procedure well without apparent complications.   FINDINGS:  A small polyp of the hepatic flexure, removed with snare  cautery technique, and  diverticulosis of the sigmoid colon, moderately  severe, noted.   PLAN:  Await biopsy report.  The patient will call me for results and  follow up with me as an outpatient.           ______________________________  Georgiana Spinner, M.D.     GMO/MEDQ  D:  03/10/2008  T:  03/10/2008  Job:  [id number]

## 2010-09-03 NOTE — H&P (Signed)
NAMEAMIRRA, [name]              ACCOUNT NO.:  [id number]   MEDICAL RECORD NO.:  [id number]          PATIENT TYPE:  INP   LOCATION:  2012                         FACILITY:  [facility]   PHYSICIAN:  Ladell Pier, M.D.   DATE OF BIRTH:  1932-08-28   DATE OF ADMISSION:  11/17/2006  DATE OF DISCHARGE:                              HISTORY & PHYSICAL   HISTORY OF PRESENT ILLNESS:  The patient is a 77 year old white female  with a past medical history significant for multiple medical problems  including heart failure, hypertension, dyslipidemia, hypothyroidism.  The patient was sent in from her primary care physician's office  secondary to labs noted to abnormal.  Creatinine was 3.8, BUN 102.  The  patient was admitted for acute renal failure.  Per daughter, the patient  has been feeling fine.  She has had no chest pain, no increase in  shortness of breath.  However, she does have some baseline shortness of  breath from her CHF.  She has had no diarrhea, no abdominal pain.  She  has taken no over-the-counter medications.  She has had no swelling in  her legs.   PAST MEDICAL HISTORY:  1. Significant for CHF.  Last echocardiogram done in March 2008 showed      a LV EF of 30 to 40%, mild left ventricular hypertrophy.  2. AFib, on chronic Coumadin therapy.  3. Hypothyroidism.  4. Hypertension.  5. Dyslipidemia.  6. GERD.  7. Mild deafness.  8. Asthma.  9. Stroke x3.  10.Degenerative joint disease.  11.Multiple allergies to environmental allergens.  12.Migraine headaches.   PAST SURGICAL HISTORY:  1. Bilateral cataract surgery.  2. DNC.  3. Removal of bunions.  4. Appendectomy.  5. Hysterectomy.  6. Partial thyroidectomy.  7. Back surgery.  8. Left knee surgery.  9. Nasal surgery x2.  10.Ulnar nerve surgery.   FAMILY HISTORY:  Mother is 59 years old.  She has 2 children, son is 29,  and daughter is 41.  Father died at 1 of epilepsy.  A brother has  diabetes.  A sister had a  stroke.   SOCIAL HISTORY:  She is retired, divorced.  She lives with her son and  his wife.  No tobacco or alcohol use.  As mentioned before, she has 2  children.   MEDICATIONS:  1. Nitroglycerin p.r.n.  2. Synthroid 0.1 mcg daily.  3. Lasix 40 mg t.i.d.  4. Neurontin 400 mg three q.h.s.  5. Topamax 100 mg daily.  6. Aspirin 81 mg daily.  7. Lovastatin 40 mg q.h.s.  8. Amiodarone 200 mg daily.  9. Diovan 320 mg daily.  10.HCTZ 25 mg daily.  11.Norvasc 10 mg twice daily.  12.Hydrocodone 7.5/750 daily.  13.Coumadin 2 mg daily.  14.Albuterol t.i.d.  15.Nexium 40 mg daily.  16.Oxycodone 15 mg six daily.  17.Cardura 2 mg quarter b.i.d.  18.Aricept 10 mg daily.  19.Isosorbide dinitrate 30 mg twice daily.  20.Coreg CR 80 mg daily.  21.Celebrex 200 mg twice daily.   ALLERGIES:  DARVON, DARVOCET, PERCODAN, AND NUBAIN.   REVIEW OF SYSTEMS:  As first stated in the  HPI.   PHYSICAL EXAMINATION:  VITAL SIGNS:  Temperature 97.6, pulse of 55,  respirations 18, blood pressure 125/50, pulse ox 97% on room air.  HEENT:  Normocephalic, atraumatic.  Pupils reactive to light.  Throat  without erythema.  CARDIOVASCULAR:  Regular rate and rhythm with a 2/6 systolic murmur.  LUNGS:  Clear bilaterally.  ABDOMEN:  Positive bowel sounds.  EXTREMITIES:  No edema.  2+ DP pulse.   LABORATORY DATA:  Sed rate of 25.  Sodium 143, potassium 3.9, chloride  103, CO2 31, BUN 108, creatinine 3.8, glucose 108.   ASSESSMENT/PLAN:  1. Acute renal failure:  This could be secondary to medications.  It      looks like the Celebrex was started recently.  Will discontinue the      Celebrex.  Hold on to her angiotensin receptor blockers and her      Lasix and HCTZ for tonight.  Will be careful with fluid      resuscitation secondary to her congestive heart failure.  Will give      her fluids at 30 mL an hour and at 2 liters and will saline lock.      Will check a C3, C4 renal ultrasound and place a Foley.   Will      recheck her kidney function in the morning.  If it is still      elevated, will do further workup and also get a renal consult.  2. Congestive heart failure:  Will continue coronary, but will hold on      to her angiotensin receptor blockers.  Will continue her Imdur.  3. Hypertension:  As stated above, will continue most of her      medications except the angiotensin receptor blockers.  4. Atrial fibrillation:  Will check PT INR and continue her Coumadin.      Ladell Pier, M.D.  Electronically Signed     NJ/MEDQ  D:  11/17/2006  T:  11/18/2006  Job:  [id number]   cc:   Jaclyn Prime. Lucas Mallow, M.D.

## 2010-09-03 NOTE — Op Note (Signed)
NAMEURIEL, HORKEY              ACCOUNT NO.:  [id number]   MEDICAL RECORD NO.:  [id number]          PATIENT TYPE:  AMB   LOCATION:  ENDO                         FACILITY:  [facility]   PHYSICIAN:  Georgiana Spinner, M.D.    DATE OF BIRTH:  1932/12/15   DATE OF PROCEDURE:  03/10/2008  DATE OF DISCHARGE:                               OPERATIVE REPORT   PROCEDURE:  Upper endoscopy.   INDICATIONS:  GERD.   ANESTHESIA:  Fentanyl 75 mcg, Versed 5 mg.   PROCEDURE:  With the patient mildly sedated in the left lateral  decubitus position, the Pentax videoscopic endoscope was inserted in the  mouth and passed under direct vision through the esophagus, which  appeared normal, into the stomach.  Fundus, body, antrum, duodenal bulb,  second portion of duodenum were visualized and all appeared normal.  From this point, the endoscope was slowly withdrawn, taking  circumferential views of duodenal mucosa.  The endoscope was then pulled  back into stomach, placed in retroflexion to view the stomach from  below.  The endoscope was straightened and withdrawn, taking  circumferential views of remaining gastric and esophageal mucosa.  The  patient's vital signs, pulse oximeter remained stable.  The patient  tolerated the procedure well without apparent complications.   FINDINGS:  Rather unremarkable examination.   PLAN:  Proceed to colonoscopy.           ______________________________  Georgiana Spinner, M.D.     GMO/MEDQ  D:  03/10/2008  T:  03/10/2008  Job:  [id number]

## 2010-09-06 NOTE — Cardiovascular Report (Signed)
[place]. [facility]  Patient:    [name], [name] Visit Number: [id number] MRN: [id number]          Service Type: MED Location: CCUA 2931 01 Attending Physician:  Berry, Jonathan [place] Dictated by:   Runell Gess, M.D. Proc. Date: 08/23/01 Admit Date:  08/23/2001   CC:         Cardiac Catheterization Laboratory  Jaclyn Prime. Lucas Mallow, M.D.   Cardiac Catheterization  INDICATIONS: The patient is a 77 year old female, patient of Dr. Guadlupe Spanish with a history of hypertension, COPD, and question cardiomyopathy. She presented to [facility] ER early this morning with chest pain. She had T wave inversion and was seen by Dr. Carleene Cooper, who at the time thought that she was pain-free and stable and she was admitted to the floor for rule out MI protocol. Liver ______ was ordered. By the time she arrived to the floor, she had worse chest pain and was given IV Nubain, which she apparently had a drug reaction to and subsequently was transferred to the unit, and was given Narcan 0.2 mg IV x2. She was complaining of chest pain and was obviously restless. Her ECG did show inferolateral T wave inversion. CPK evolved positively with a total of approximately 900 and MB fraction of 27. She had a bradycardic arrest and was given IV atropine, intubated, ______ and sedated. She then developed a wide-complex tachycardia, for which she was treated with IV Lopressor, amiodarone. She was ultimately heparinized and was brought to the cardiac catheterization laboratory emergently for coronary arteriography.  DESCRIPTION OF PROCEDURE: The patient was brought to the second floor [facility] Cardiac Catheterization Laboratory emergently, intubated, sedated and being bagged. She had a triple-lumen already inserted in the left femoral vein. Her right groin was prepped and shaved in the usual sterile fashion. Xylocaine 1% was used for local anesthesia. A 7 French sheath was inserted into  the right femoral artery using standard Seldinger technique. An 8 French sheath was inserted into the right femoral vein. A 7 French balloon-tipped, thermodilution, [name]-Ganz catheter was then advanced through the right heart chambers to obtain sequential pressures. The patient received 3000 units of IV heparin with an ending ACT of 289. Using a 6 French right and left Judkins diagnostic catheter along with a 6 French pigtail catheter, selective coronary angiography, left ventriculography, and distal abdominal aortography were performed. Omnipaque dye was used for the entirety of the case. Retrograde, aortic, left ventricular, and pullback pressures were recorded.  HEMODYNAMICS: 1. Aortic systolic pressure 120, diastolic pressure 59. 2. Left ventricular systolic pressure 90 and end-diastolic pressure 23. 3. RA pressure 20/20. 4. RV pressure, systolic 42, diastolic pressure 12. 5. PA pressure 37/25. 6. Pulmonary capillary wedge pressure mean of 21, A wave 27, V wave of 33.  SELECTIVE CORONARY ANGIOGRAPHY: 1. Left main: Normal. 2. Left anterior descending: The LAD had a segmental smooth 80% stenosis    in its midportion. It took an unusual course suggesting possible proximal    occlusion, though this could not be angiographically identified. 3. Left circumflex: This vessel is free of significant disease. There was    a suggestion of ______ in one view. However, this was not seen on other    views. 4. Right coronary artery: This is a large dominant vessel with at most 50%    smooth stenosis at the jenu.  LEFT VENTRICULOGRAPHY: The RAO left ventriculogram was performed using 20 cc of Omnipaque dye at 10 cc/sec. The overall LVEF  was estimated at approximately 10-15% with anteroapical dyskinesia.  DISTAL ABDOMINAL AORTOGRAPHY: Distal abdominal aortogram was performed using hand injection revealing normal aorta and iliac bifurcation.  IMPRESSION: The patient has a cardiomyopathy out of  proportion to a degree of coronary artery disease. She does have positive enzymes but I do not readily appreciate an infarct-related artery. Because of hypotension in the catheterization lab, she was begun on dopamine. An intra-aortic balloon pump was inserted. She was sating at approximately 86% on 100% oxygen. A Foley was in place and she received 120 mg of Lasix IV in the catheterization lab. All the sheaths, balloon pump and [name]-Ganz catheters were sewn securely in place. The patients prognosis remains guarded/poor. Dictated by:   Runell Gess, M.D. Attending Physician:  Berry, Jonathan [place] DD:  08/23/01 TD:  08/23/01 Job: [id number]

## 2010-09-06 NOTE — Discharge Summary (Signed)
[place]. [facility]  Patient:    Jodi, [name] Visit Number: [id number] MRN: [id number]          Service Type: ECR Location: SACU 4533 01 Attending Physician:  Clovis Cao Dictated by:   Jaclyn Prime. Lucas Mallow, M.D. Admit Date:  09/01/2001 Discharge Date: 09/11/2001   CC:         Pain Clinic, [facility]   Discharge Summary  HISTORY OF PRESENT ILLNESS:  This 77 year old woman was admitted to the subacute care unit in order to complete her need for hospitalization following an admission with severe congestive heart failure requiring emergency cardiac catheterization which showed no evidence of coronary artery disease, but the presence of extraordinarily severe left ventricular dysfunction requiring several days of intra-aortic balloon therapy.  Subsequently, her cardiac status appeared to improve, but her level of function was very low and she was transferred to the subacute care unit.  PHYSICAL EXAMINATION:  Please see history and physical from the previous hospital admission dated approximately Aug 19, 2001.  HOSPITAL COURSE:  She received rehabilitation services and had a gradual improvement in function.  It was noted that she was more confused than usual, and we subsequently discovered that she had been receiving more methodone in the hospital than she was actually taking at home, although, the dosage received in the hospital was what we understood her prescription to be.  The dose of methodone was gradually reduced and this resulted in a significant improvement in her mental status.  At the same time, MRI scanning of the brain was done, and this did show "acute mildly hemorrhagic infarcts left caudate head, anterior aspect of left ventricular nucleus, left corona radiata anteriorly, and left parietal occipital lobe and baseline atrophy."  There was also an angiogram of the Circle of Willis which showed some atherosclerotic changes in the  vessels.  We had maintained her on anticoagulants because of her severe left ventricular dysfunction, but when the appearance of possible intracranial bleeding was noted, the anticoagulants were stopped.  With a combination of reduced dosages of her pain medications, (which she has been receiving chronically from the Pain Clinic at [facility]) and discontinuation of the anticoagulants, her mental status did improve somewhat.  Plans for home care were carried out with intensive involvement by her family. Arrangements were made for outpatient PT, OT, ST, RN, and aide.  Two-dimensional echocardiogram was done on May 15, and it showed mild left ventricular dilatation, but recovery of left ventricular function probably to her premorbid state, with left ventricular ejection fraction estimated to 40 to 50%.  Overall picture was felt to represent very significant improvement in left ventricular contractility compared to the situation at the time of the original admission.  In reviewing these results with the family, it seemed likely that she had had acute embolization of the coronary arteries.  She has had chronic atrial fibrillation, but has not been able to receive longterm outpatient anticoagulation therapy because of uncontrollable recurrent episodes of epistaxis.  She had no epistaxis during hospitalization, but still did not appear to be a candidate for outpatient anticoagulation at the time of discharge.  The EKG done latest in her hospitalization was dated May 15.  It showed sinus bradycardia with marked lateral T wave inversions consistent with subendocardial ischemia.  FINAL DIAGNOSES: 1. Acute congestive heart failure and circulatory collapse, of uncertain    cause, but possibly related to acute coronary artery embolization as    noted above. 2. Chronic recurrent  epistaxis. 3. Congestive heart failure with nonischemic cardiomyopathy. 4. Multiple allergies. 5.  Hypothyroidism. 6. Chronic pain syndrome, requiring multiple medications. 7. Hypertension. 8. Gastroesophageal reflux disease. 9. Hyperlipidemia.  DIET:  Cholesterol-lowering and low salt diet.  PAIN MANAGEMENT:  To be prescribed by the Pain Clinic as noted above.  ACTIVITY:  Walking as tolerated.  WOUND CARE:  Not applicable.  SUPERVISION:  The patient is not to be left alone at home.  DISCHARGE INSTRUCTIONS:  Home Health occupational and physical therapy, ST and RN with an aide.  DISCHARGE MEDICATIONS:  1. Amitriptyline 25 mg h.s.  2. Enteric-coated aspirin 81 mg daily.  3. Protonix 40 mg daily.  4. Cordorone 200 mg daily.  5. Diovan 320 mg daily.  6. Topamax 100 mg daily.  7. Neurontin 400 mg at h.s.  8. Coreg 12.5 mg b.i.d.  9. Hydrochlorothiazide 25 mg daily. 10. Combivent inhaler two puffs b.i.d. 11. Synthroid 0.1 mg daily. 12. Lescol 80 mg daily.  CONDITION ON DISCHARGE:  Improving stable. Dictated by:   Jaclyn Prime. Lucas Mallow, M.D. Attending Physician:  Clovis Cao DD:  10/13/01 TD:  10/14/01 Job: [id number]

## 2010-09-06 NOTE — Discharge Summary (Signed)
[place]. [facility]  Patient:    [name], [name] Visit Number: [id number] MRN: [id number]          Service Type: ECR Location: SACU 4533 01 Attending Physician:  Clovis Cao Dictated by:   Jaclyn Prime. Lucas Mallow, M.D. Admit Date:  09/01/2001 Discharge Date: 09/11/2001   CC:         Runell Gess, M.D.  Danice Goltz, M.D.   Discharge Summary  CHIEF COMPLAINT: Chest pain.  HISTORY OF PRESENT ILLNESS: This 77 year old woman was [facility] by EMS because of substernal chest pain. When seen in the emergency room she apparently gave a history of chest discomfort for about one month but worse on the night of admission. She also reported feeling weak and nauseated. The remainder of her evaluation in the emergency room was essentially unremarkable.  PHYSICAL EXAMINATION: Revealed no specific abnormal findings.  VITAL SIGNS: Blood pressure 128/55, pulse 96, respiratory rate 22 and regular.  PAST MEDICAL HISTORY: No details available except for history of known congestive heart failure and syncope.  PAST SURGICAL HISTORY: No details available.  LABORATORY DATA: Venous blood gases revealed pH of 7.373, pCO2 44. White cell count 6,800. Hemoglobin 11.3, platelets 250,000 with an essentially unremarkable diff count. Creatinine 2.0, BUN 29, electrolytes normal. CK with MB 914 units total, 27 of MB and those were the peak values. Troponin I peak valve of 2.98. TSH 10.4 which was on Aug 28, 2001. Blood cultures negative. Urine culture negative. Respiratory culture moderate Staph aureus which was apparently not tested for methicillin toxicity.  HOSPITAL COURSE: In the emergency room she was started on a Nitroglycerin drip and appeared to have resolution of the chest pain with which she presented. However, shortly upon arrival to the floor she complained of chest pain. She was given IV Nubain and had an adverse reaction with facial  paresthesias. She was transferred to the CCU where she was given Narcan. She then had a bradycardic arrest which responded to IV Atropine and then had multiple episodes of ventricular tachycardia, requiring repeated cardioversion, although with good maintenance of blood pressure. After she was stabilized, she was taken to the cardiac cath lab by Dr. Allyson Sabal, where she was found to have fairly modest amounts of coronary artery disease with a severe dilated cardiomyopathy with an EF estimated at 15%. Because of severe hypotension in the cath lab, she was started on Dopamine drip and intra-aortic balloon pump was inserted. She was returned to the CCU for management. The pulmonary intensive care group were also consulted and they participated in her care. The patient was initially poorly responsive but her blood pressure and pulse rate were maintained adequately on the intra-aortic balloon pump. Over the course of the first day in the hospital, she began to wake up somewhat and became more agitated. Review of her situation revealed that she had been on Methadone therapy from the pain clinic at [facility]. Pain medication was restarted attempting to reach a level which would help to control her agitation. This appeared to be reasonably successful. Over the course of the next day, her alertness continued to increase and her blood pressure was adequately maintained. By Aug 25, 2001, plans were made to remove the balloon pump, and this was done successfully and she was maintained on Dobutamine drip. Her respiratory status also continued to improve. By Aug 26, 2001 she was off the ventilator and was awake and alert and breathing well on her  own. There was a question of a Staph aureus infection as noted above and she had been started on Ceftriaxone. Appropriate adjustments in her antibiotics were planned. Methicillin resistant Staph was never clearly demonstrated. During the course  of her time on the respirator, she was treated with Pavulon and Versed as needed and had no apparent adverse reaction to those drugs. As her blood pressure began to be sustained, she was started back on Carbetalol, which she had taken previously and appeared to tolerate that well. She had chest and back pain which she has had for quite some time and the best possible adjustments were made in her pain medications. She was started on cardiac rehab and PT. She was noted to be extremely debilitated but this was noted to be an ongoing problems which had existed before her admission. By Sep 01, 2001 she was felt to be stable enough for transfer to the subacute care unit, and that was carried out. At that time, her blood pressure was well sustained and her vital signs were otherwise stable.  FINAL DIAGNOSES: 1. Chest pain with acute decompensation and dilated cardiomyopathy. Cause    uncertain, but possibly due to coronary artery emboli as discussed with the    family. 2. History of cardiomyopathy. 3. Chronic epistaxis. A contraindication to long term Coumadin therapy as    discussed with the family. 4. Chronic anxiety. 5. Chronic pain syndrome, on maintenance Methadone therapy from the pain    clinic at the [facility]. 6. Degenerative osteoarthritis. 7. Chronic obstructive pulmonary disease. 8. Hypothyroidism. 9. Staph infection in the sputum, apparently resolved. 10.Extreme deconditioning. 11.Recurrent ventricular tachycardia, requiring pharmacologic treatment. 12.Hypertension. 13.Peripheral neuropathy. 14.Hyperlipidemia. 15.Tremor. 16.Gastroesophageal reflux disease.  CONDITION ON TRANSFER: Improved and stable.  LONG-TERM PROGNOSIS: Unknown.  RETURN TO WORK: Inapplicable.  OPERATIONS: Cardiac catheterization by Dr. Nanetta Batty with insertion of intra-aortic balloon pump.  COMPLICATIONS: None. Dictated by:   Jaclyn Prime. Lucas Mallow, M.D.  Attending Physician:   Clovis Cao DD:  11/03/01 TD:  11/06/01 Job: [id number]

## 2010-09-06 NOTE — Op Note (Signed)
[place]  Patient:    [name], [name]                     MRN: [id number] Proc. Date: 12/13/99 Adm. Date:  [id number] Disc. Date: [id number] Attending:  Serena Colonel H                           Operative Report  PREOPERATIVE DIAGNOSES: 1.  Ulceration right nasal septum. 2.  Chronic epistaxis. 3.  Pigmented lesion in the left temporal scalp area.  POSTOPERATIVE DIAGNOSES: 1.  Ulceration right nasal septum. 2.  Chronic epistaxis. 3.  Pigmented lesion if the left temporal scalp area.  PROCEDURE: 1.  Excision of right nasal septal ulceration. 2.  Full thickness kin graft, reconstruction of nasal septal defect. 3.  Excisional biopsy of left temporal scalp lesion.  SURGEON:  Jefry H. Pollyann Kennedy, M.D.  ANESTHESIA:  General endotracheal anesthesia was used.  COMPLICATIONS:  None.  FINDINGS: 1.  Diffuse inflammatory and ulcerated mucosa of the anterior septum on     the right side and somewhat on the left side, as well.  The mucosa was     very friable and fragile. 2.  Two small pigmented lesions, one approximately 4 to 5 mm and the second     one approximately 2 to 3 mm in the left temporal scalp area.  SPECIMENS TO PATHOLOGY: 1.  Right nasal septal ulceration. 2.  Left temporal scalp lesions.  REFERRING PHYSICIAN:  Dr. Aggie Cosier.  ESTIMATED BLOOD LOSS:  50 cc.  The patient tolerated the procedure well, was awakened, and transferred to recovery in stable condition.  INDICATIONS FOR PROCEDURE:  The patient is 77 year old lady with a more than two-year history of chronic nose bleeds from the right side, not controlled with multiple extensive cauterizations, nasal hygiene, or stopping her anticoagulant medication.  Additional history is two small pigmented lesions left temporal scalp area. Risks, benefits, alternatives, complications for the procedure were explained to the patient, who seemed to understand and agreed to  surgery.  PROCEDURE IN DETAIL:  The patient was taken to the operating room, placed on the operating table in a supine position.  Following induction of general endotracheal anesthesia, the patient was prepped and draped in a standard fashion.  1.  Excision of right nasal septal ulceration.  1% Xylocaine with 1:100,000     epinephrine was infiltrated into the septum on both sides.  The nasal     cavities were then packed with Afrin-soaked pledgets.  A 15 scalpel was     used to incise the anterior nasal septal skin in front of the ulceration.     Careful elevation of the mucosal flap was then attempted with freer     elevator, but the mucosa was too friable to elevate in one nice, smooth     flap and was removed in a piecemeal fashion.  Two small tears occurred in     the contralateral side, as there was missing septum in this area.  The     entire abnormal mucosal area was excised.  This was sent for pathologic     evaluation. 2.  Full thickness left skin graft, nasal septal defect.  The right clavicular     area was prepped and the necessary skin was outlined in elliptical fashion     and a #10 blade was used to incise the skin through the dermis and to  elevate a nice, thin, full-thickness graft without any subcutaneous fat.     The donor site was cauterized with electrocautery and closed with a     running 4-0 nylon suture.  The skin graft was then brought in to the nasal     field and the two ends of the ellipse were removed, creating a nice     circular graft.  The graft was sewn in place using 4-0 Vicryl sutures.     It was secured in place as well, using Silastic sheeting that was cut to     size and shape and used to sandwich the nasal septum with Bacitracin     ointment in between.  A 4-0 nylon suture was then used through and     through to secure it in place.  A Merocel pack was placed on the right     side and a rolled up Telfa pack was placed on the left side. 3.   Excision of facial lesions.  An ellipse of skin was outlined surrounding     these two lesions.  The total ellipse was approximately 1-1/2 cm x .8 cm.     A 15 scalpel was used to excise the lesion in its entirety down to the     subcutaneous layer.  Electrocautery was used for hemostasis.  The wound     was reapproximated with 4-0 nylon sutures interrupted.  Bacitracin was     applied to the two suture lines.  The patient was then awakened, extubated     and transferred to recovery room. DD:  12/13/99 TD:  12/15/99 Job: [id number]

## 2010-09-06 NOTE — Discharge Summary (Signed)
NAMEMERIDEE, BRANUM              ACCOUNT NO.:  [id number]   MEDICAL RECORD NO.:  [id number]          PATIENT TYPE:  INP   LOCATION:  2012                         FACILITY:  [facility]   PHYSICIAN:  Lonia Blood, M.D.      DATE OF BIRTH:  12-26-1932   DATE OF ADMISSION:  11/17/2006  DATE OF DISCHARGE:  11/23/2006                               DISCHARGE SUMMARY   PRIMARY CARE PHYSICIAN:  Dr. Aggie Cosier.   DISCHARGE DIAGNOSES:  1. Acute renal failure secondary to ACE inhibitors and diuretics,      resolved at the time of discharge.  2. Atrial fibrillation with INR, with Coumadin on board.  3. Anemia.  4. Systolic dysfunction of congestive heart failure.  5. Dyslipidemia.  6. Gastroesophageal reflux disease.  7. Hypothyroidism.  8. Hypertension.  9. Mild deafness.  10.History of asthma.  11.History of cerebrovascular disease.  12.Degenerative joint disease.  13.Migraine headaches.   DISCHARGE MEDICATIONS:  Include:  1. Lovenox 60 mg subcutaneously b.i.d., until INR is 2 to 3.  2. Baby aspirin 81 mg daily.  3. Amiodarone 200 mg daily.  4. Carvedilol 50 mg twice a day.  5. Lasix 40 mg three times a day.  6. Nexium 40 mg daily.  7. Namenda 5 mg daily.  8. Celebrex 200 mg daily.  9. Imdur 30 mg in the ER.  10.Stools softener.  11.Coumadin at 5 mg daily, to be adjusted as an outpatient.  12.Gabapentin 400 mg 3 times a day.  13.Aricept 10 mg daily.  14.Synthroid 100 mcg daily.  15.Topamax 100 mg q.h.s.   DISPOSITION:  The patient was discharged in good health.  She is to  continue to follow up with Dr. Lucas Mallow in the office, within a week.  She  was taken off her Diovan and doxazosin and hydrochlorothiazide.   PROCEDURES PERFORMED:  Renal ultrasound performed July 30th, showed no  hydronephrosis, mild renal cortical thinning bilaterally, and right  renal cysts.   CONSULTATIONS:  Dr. Lucas Mallow.   BRIEF HISTORY AND PHYSICAL:  Please refer to dictated history and  physical on  admission by Dr. Ladell Pier.  In short however, the  patient was a 77 year old white female with multiple medical problems as  described above, who is essentially came in complaining of generalized  weakness.  She was seen in her primary care physician's office and found  to have abnormal labs, with a creatinine 3.8 and BUN of 102.  She was  diagnosed with acute renal failure and sent to the hospital for further  treatment.   HOSPITAL COURSE:  1. Acute renal failure.  The patient was on ACE inhibitor, multiple      diuretics and other blood pressure medications.  Her renal failure      was thought to be probably pre-renal, in addition to intrarenal      from ACE inhibitor.  The ACE inhibitors, diuretics and other blood      pressure medicines were held.  She seemed to have responded well.      Her creatinines continued to drop, and at the time of discharge her  creatinine was 1.38.  She was taken completely off on any ACE      inhibitor or ARB, and she will have followup as an outpatient, and      further treatment will be administered as needed.  2. Atrial fibrillation.  The patient was on Coumadin which was      initially stopped in the hospital but then restarted.  At the time      of discharge, her INR was only 1.6.  Hence, she was discharged on      Lovenox to bridge, until her INR becomes therapeutic.  3. Anemia.  Her hemoglobin dropped as low as 8.8, but stayed there      prior to discharge.  No transfusion was given, as her hemoglobin      was stable, but she has chronic anemia.  She was guaiac negative.  4. CHF.  The patient has systolic dysfunction CHF and she is being      followed by Dr. Lucas Mallow.  She will resume her care with him as an      outpatient.  Dyslipidemia.  Will continue with her Statin, during      this hospitalization.  5. GERD.  Again, the patient had some PPI added.  6. Left-sided numbness in the skull.  The patient has history of      migraine  headaches, which is chronic.  Complained of the left side      of her skull being permanently normal.  Due to her high BUN and      creatinine, we could not do an MRI with contrast.  So, she is to      resume workup by her primary care physician, once she is discharged      and once her creatinine is completely stabilized.  Otherwise, she      was doing fine and was sent home with home health PT/OT, to help      her cope.      Lonia Blood, M.D.  Electronically Signed     LG/MEDQ  D:  12/24/2006  T:  12/24/2006  Job:  [id number]

## 2010-09-06 NOTE — H&P (Signed)
[name], [name]                        ACCOUNT NO.:  [id number]   MEDICAL RECORD NO.:  [id number]                   PATIENT TYPE:  INP   LOCATION:  3039                                 FACILITY:  [facility]   PHYSICIAN:  Quita Skye. Hart Rochester, M.D.               DATE OF BIRTH:  06-06-1932   DATE OF ADMISSION:  10/26/2003  DATE OF DISCHARGE:                                HISTORY & PHYSICAL   CHIEF COMPLAINT:  Pulmonary embolus with history of poor tolerance to  Coumadin therapy.   HISTORY OF PRESENT ILLNESS:  This is a 77 year old female patient who was  admitted on October 26, 2003, and found to have bilateral pulmonary emboli on CT  scan.  She had presented with a three week history of some dull chest  discomfort as well as shortness of breath.  She has a history of angina  pectoris which is stable, but these symptoms were different.  She has no  known history of deep vein thrombosis or thrombophlebitis, but does have a  history of swelling in her lower extremities.   PAST MEDICAL HISTORY:  1. History of atrial fibrillation with thrombus in her heart and poor     tolerance to Coumadin therapy because of a history of epistaxis and poor     control of her Coumadin.  2. History of myocardial infarction in 2003.  3. Cerebrovascular accident x3.  4. Osteoarthritis.  5. Hyperthyroidism.  6. Lumbar back problems.  7. Neuropathy.  8. Epistaxis.   SOCIAL HISTORY:  She does not use tobacco or alcohol, and has not in the  past.   ALLERGIES:  Possibly to NUBAIN.   MEDICATIONS:  Please see chart.   PHYSICAL EXAMINATION:  VITAL SIGNS:  Blood pressure is 129/51, heart rate is  55, respirations are 20.  GENERAL:  She is an alert and oriented Caucasian female patient who is in no  acute distress.  NECK:  Supple with 3+ carotid pulses.  No bruits are audible.  NEUROLOGIC:  Some difficulty in recalling facts with mild memory loss.  Otherwise, seems intact.  EXTREMITIES:  Upper extremities:   Pulses are 3+ bilaterally.  There is no  palpable adenopathy in the neck.  There is mild edema of both lower  extremities which is not pitting.  There is no evidence of venous stasis,  ulceration, or stasis dermatitis.  CHEST:  Clear to auscultation with the exception of bilateral rhonchi.  ABDOMEN:  Soft, nontender, no masses.  She has 3+ femoral, 2+ popliteal, and  2+ dorsalis pedis pulses bilaterally.   LABORATORY DATA:  CT scan on October 26, 2003, revealed bilateral pulmonary  emboli.  INR is currently 2.7, on Coumadin therapy and Lovenox.   IMPRESSION:  1. Recent bilateral pulmonary emboli.  2. Poor tolerance to Coumadin therapy.  3. Atrial fibrillation.  4. Coronary artery disease with history of cardiac thrombus.   RECOMMENDATIONS:  Agree that  the patient should have inferior vena caval  filter placed during this admission because of her poor tolerance to  Coumadin therapy in the past.  We will reverse her Coumadin now and check  INR tomorrow, and if adequate proceed with vena caval filter placement.  Also, we will obtain venous duplex examination of her lower extremities to  rule out a silent thrombus in her legs.                                                Quita Skye Hart Rochester, M.D.    JDL/MEDQ  D:  10/30/2003  T:  10/30/2003  Job:  [id number]   cc:   Jaclyn Prime. Lucas Mallow, M.D.  [address]  [place]  [place] [address]  Fax: [phone]

## 2010-09-06 NOTE — Op Note (Signed)
[name], [name]                        ACCOUNT NO.:  [id number]   MEDICAL RECORD NO.:  [id number]                   PATIENT TYPE:  INP   LOCATION:  3039                                 FACILITY:  [facility]   PHYSICIAN:  Quita Skye. Hart Rochester, M.D.               DATE OF BIRTH:  11/16/1932   DATE OF PROCEDURE:  11/02/2003  DATE OF DISCHARGE:                                 OPERATIVE REPORT   PREOPERATIVE DIAGNOSIS:  Pulmonary emboli with intolerance to Coumadin  therapy.   POSTOPERATIVE DIAGNOSIS:  Pulmonary emboli with intolerance to Coumadin  therapy.   PROCEDURES:  1. Inferior vena cavagram via right common femoral vein approach.  2. Insertion of a TrapEase inferior vena caval filter.   SURGEON:  Quita Skye. Hart Rochester, M.D.   ANESTHESIA:  Local Xylocaine.   COMPLICATIONS:  None.   CONTRAST:  40 mL.   DESCRIPTION OF PROCEDURE:  The patient was taken to the [facility]  peripheral endovascular lab, placed in the supine position, at which time  the right groin was prepped with Betadine scrub and solution and draped in  the routine sterile manner.  After infiltration with 1% Xylocaine, the right  common femoral vein was entered percutaneously, a guidewire passed into the  infrarenal inferior vena cava under fluoroscopic guidance.  Sheath and  dilator were passed over the dilator and the dilator removed and an inferior  vena cavagram performed, injecting 40 mL of contrast at 20 mL/sec.  This  revealed the inferior vena cava to be of normal caliber and the location of  the renal veins was located at the interspace between the L1-L2 vertebrae.  Following this the dilator was reinserted and the sheath advanced to the  appropriate level to position the vena caval filter in the midportion of the  L2 vertebral body.  The TrapEase-Cordis IVC filter was then deployed at the  L2 vertebral body level without difficulty and location confirmed after  removal of the sheath.  Adequate compression  applied to the groin.  No  complications ensued.                                               Quita Skye Hart Rochester, M.D.    JDL/MEDQ  D:  11/02/2003  T:  11/02/2003  Job:  [id number]

## 2010-09-06 NOTE — Discharge Summary (Signed)
[name], [name]                        ACCOUNT NO.:  [id number]   MEDICAL RECORD NO.:  [id number]                   PATIENT TYPE:  INP   LOCATION:  3039                                 FACILITY:  [facility]   PHYSICIAN:  Jonna L. Robb Matar, M.D.            DATE OF BIRTH:  1932-07-16   DATE OF ADMISSION:  10/26/2003  DATE OF DISCHARGE:  11/05/2003                                 DISCHARGE SUMMARY   PRIMARY CARE PHYSICIAN:  Jaclyn Prime. Lucas Mallow, M.D.   CONSULTATION:  Quita Skye. Hart Rochester, M.D.   FINAL DIAGNOSES:  Bilateral pulmonary emboli, acute bronchitis, coronary  artery disease, coagulopathy, aortic stenosis, old cerebrovascular accident,  constipation, chronic asthma.   PROCEDURES:  On July 14, Dr. Hart Rochester, inferior vena cavagram with insertion  of inferior vena cava  filter.   ALLERGIES:  NUBAIN.   CODE STATUS:  Full.   HISTORY:  This 77 year old female has had coronary artery disease, ischemic  cardiomyopathy, had a 3 weeks' history of progressive dyspnea.  Advanced  scanning showed bilateral segmental pulmonary emboli.   For the rest of her past medical history, please see her H&P.   PHYSICAL EXAMINATION ON ADMISSION:  Notable for mild bradycardia, was  otherwise surprisingly negative other than mild peripheral edema.   HOSPITAL COURSE:  Hypercoagulable work-up showed increasing antithrombin-III  and homocysteine.  Initially, the patient was started on Lovenox and  Coumadin but it was found after a couple of days that the patient had an  outpatient history of severe difficulty in regulating Coumadin with episodes  of life-threatening epistaxis. It was therefore felt that she would do  better with inferior vena cava  filter for the long-term. Dr. Hart Rochester of [facility]  was consulted and this filter was placed in on the 14th.   Subsequent to that, the patient had a couple of problems including onset of  a much more severe cough which responded only partially to Zithromax, and  constipation notably requiring an enema.  She had an episode of angina on  July 15 which was worked up and she ruled out for an MI.   DISCHARGE MEDICATIONS:  The patient will be discharged on Lovenox 65 mg  subcutaneous every 12 hours, Coumadin 1 mg daily and will try to keep her  subtherapeutic at about 1.5.  She will continue on her previous medicines  of:  1. Amiodarone 200 daily.  2. Combivent 2 puffs t.i.d.  3. Coreg 25 b.i.d.  4. Diovan 320 daily.  5. Norvasc 10 mg.  6. Protonix 40 mg daily.  7. Synthroid 0.1 mg daily.  8. Topamax 100 daily.  9. Lescol XL 80 daily.  10.      Apresoline 10 t.i.d.  11.      Lasix 40 mg  daily.  12.      Elavil 25 q.h.s.  13.      Neurontin 400 q.h.s.  14.      Lactulose.  15.      Claritin 10 daily.  16.      Flovent 110, 1 puff b.i.d. to try to quiet down her cough.  17.      Tylox as needed for pain.   DISPOSITION:  She will be seen in Dr. Pollie Friar office within the week and get  a pro time monitored. She will have a Fleet enema before she leaves.                                                Jonna L. Robb Matar, M.D.    Dorna Bloom  D:  11/05/2003  T:  11/05/2003  Job:  [id number]   cc:   Jaclyn Prime. Lucas Mallow, M.D.  [address]  [place]  [place] [address]  Fax: [phone]

## 2010-09-06 NOTE — H&P (Signed)
[name], [name]                        ACCOUNT NO.:  [id number]   MEDICAL RECORD NO.:  [id number]                   PATIENT TYPE:  OUT   LOCATION:  MRI                                  FACILITY:  [facility]   PHYSICIAN:  Hettie Holstein, D.O.                 DATE OF BIRTH:  [birth year]   DATE OF ADMISSION:  10/26/2003  DATE OF DISCHARGE:                                HISTORY & PHYSICAL   PRIMARY CARE PHYSICIAN:  Dr. Jaclyn Prime. [place].   CHIEF COMPLAINT:  Shortness of breath.   HISTORY OF PRESENT ILLNESS:  This is a pleasant, 77 year old Caucasian  female with a past medical history significant for coronary artery disease  and significant diastolic dysfunction, as well as ischemic cardiomyopathy  who presents today with progressive shortness of breath over the past three  weeks.  Status post evaluation in her primary-care physician's office, who  was referred to [facility] for CT scanning which revealed bilateral  segmental pulmonary emboli.  She is stable in the emergency department  hemodynamically.  She is chest pain free.  She complains of increasing  frequency of angina over the past three weeks.  She does have a history of  chronic stable angina.  At this time, she is being directed for admission to  the hospital.   She denies any recent trauma, recent travel, recent hospitalization or  operations.  She has had no prior history of clots in the past.  She has no  family history of hypocoagulable states.  She does report some weight loss  over the course of the past six months. She states she has been quite  active.  She has lost about 16 pounds over the last six months.  She can not  recall when her last mammogram was, nor can she recall if she has had a  screening colonoscopy.   PAST MEDICAL HISTORY:  History of myocardial infarction in 2003 with  coronary artery disease, status post cardiac catheterization at that time  which revealed an LAD 80% lesion, in addition to left  circumflex free of  disease, RCA at 50% and ventriculogram at that time revealed 10-15 ejection  fraction.  She did have subsequent 2-D echocardiogram Sep 01, 2001, that  revealed an EF ranging from 40 to 50%, and some mild aortic stenosis with  valve area of 1.39 cm squared which reflected an improvement from the  ventriculogram.  History of epistaxis with Coumadin long ago.  She states  that she continues to have problems with epistaxis.  She had been on  Coumadin for which she states she believes was atrial fibrillation.  She has  had multiple CVA's in the past with a history of left-sided hemiparesis that  has resolved and some memory deficits.  She has had some back problems with  multiple surgeries and recent evaluation by a neurosurgeon I believe who is  performing steroid injections  to the back for lower-extremity neuropathy.  She has, as noted above, a history of chronic stable for which she states  she has angina probably every, at most, two weeks.  History of thyroid  disease.  History of appendectomy and hysterectomy.  History of multiple  orthopedic procedures including her shoulders, elbows and patella.  She  retains her gallbladder.   FAMILY HISTORY:  Her mother is 33 years old and is in good health.  Her  father died at the age of 37.  He had epilepsy.   SOCIAL HISTORY:  She lives with her mother.  She denies tobacco or alcohol.  She has two children, one son with sarcoidosis and hypertension.  The other,  otherwise healthy.  She is a retired [employer] manager.  She ambulates at  home typically without problems.   MEDICATIONS:  Her medication list is quite extensive.  She takes:  1. Amiodarone 200 mg daily.  2. Amitriptyline 25 mg daily.  3. Aspirin 81 mg daily.  4. Celebrex 400 mg two times daily.  5. Combivent two puffs, three times daily.  6. Coreg 25 mg two times daily.  7. Diovan 320 mg daily.  8. K-Dur 20 mEq three times a day.  9. Lasix 40 mg up to three times  daily p.r.n.  10.      Lescol XL 80 mg daily.  11.      Neurontin 400 mg daily.  12.      Norvasc 10 mg daily.  13.      Protonix 40 mg daily.  14.      Synthroid 100 micrograms daily.  15.      Topamax 100 mg daily.  16.      Flexeril 10 mg p.r.n.  17.      Nitroglycerin spray p.r.n.  18.      NitroQuick pills 0.4 p.r.n.  19.      Lidoderm patches to her back, 5 mg p.r.n.  20.      Lortab 5 mg p.r.n.  21.      Phenergan 12.5 mg daily.  22.      Tigan 250 mg p.r.n.   ALLERGIES:  NUBAIN.  She states this causes her heart to stop.  She reports  allergies to CODEINE, DARVOCET AND DARVON for which she develops hives.   REVIEW OF SYSTEMS:  She reports some chills at home, no fevers, no night  sweats, no nausea, vomiting or diarrhea.  She does report some constipation.  She reports some weight loss, 16 pounds over the past six months, not  intentional.  She reports some transient swelling in the lower extremities  which have resolved at this point.  She complaining of neuropathy of the  lower extremities which she sees a Tree surgeon for.  Otherwise, she denies any hematochezia, hematemesis, melena, coffee-ground  emesis, dysuria.  She denies any cough or sputum production.   PHYSICAL EXAMINATION:  VITAL SIGNS:  Reviewed and found to be stable.  Heart  rate ranging from 49 to 55, blood pressure 177/77.  O2 saturation on room  air 100%.  She was afebrile.  GENERAL:  She is alert and oriented, in no acute distress.  HEENT:  Normocephalic, atraumatic.  Extraocular muscles were intact.  No  focal neurologic deficit was noted on neurologic examination.  CARDIOVASCULAR:  There was normal S1, S2, laterally displaced PMI.  LUNGS:  Clear to auscultation bilaterally.  ABDOMEN:  Soft, nontender, no palpable mass or hepatosplenomegaly.  EXTREMITIES:  Revealed no edema, no calf tenderness.  No Homan's sign.  Her  strength was +5/5 bilaterally.  LABORATORY DATA:  INR 1.0, CBC that  revealed WBC 5.2, hemoglobin 11.5,  hematocrit 34.0, platelet count 280,000.  CT as noted above revealed  bilateral segmental pulmonary emboli.   IMPRESSION:  1. Acute bilateral pulmonary emboli.  No identifiable inciting events.  She     is currently hemodynamically stable.  2. Coronary artery disease with chronic stable angina.  3. Anemia.  4. History of epistaxis.  5. Difficult venous access.   PLAN:  1. We are going to admit Ms. Devaul for observation over the next 12 hours     to the telemetry floor.  2. We are going to initiate anticoagulation with Lovenox.  As she is a     difficult venous access, we will likely have difficulty with blood draws     with her.  Anticipate starting on Coumadin tomorrow.  3. We will order a hypercoagulable panel, and start her on at least three     month's therapy of Coumadin.  4. She is hemodynamically stable at this time.  5. She does have a prior history of epistaxis with Coumadin.  We will     observe her closely overnight, and follow her supportively, and continue     her same medications as she was at home.                                                Hettie Holstein, D.O.    ESS/MEDQ  D:  10/26/2003  T:  10/26/2003  Job:  [id number]   cc:   Aram Candela. Aleen Campi, M.D.  [address]  [place]  [place] [address]  Fax: [phone]   Jaclyn Prime. Lucas Mallow, M.D.  [address]  [place]  [place] [address]  Fax: [phone]

## 2010-10-14 ENCOUNTER — Encounter: Payer: Self-pay | Admitting: *Deleted

## 2010-10-15 ENCOUNTER — Encounter: Payer: Self-pay | Admitting: Internal Medicine

## 2010-10-15 ENCOUNTER — Ambulatory Visit (INDEPENDENT_AMBULATORY_CARE_PROVIDER_SITE_OTHER): Payer: Medicare Other | Admitting: Internal Medicine

## 2010-10-15 DIAGNOSIS — Z950 Presence of cardiac pacemaker: Secondary | ICD-10-CM | POA: Insufficient documentation

## 2010-10-15 DIAGNOSIS — E785 Hyperlipidemia, unspecified: Secondary | ICD-10-CM

## 2010-10-15 DIAGNOSIS — I5032 Chronic diastolic (congestive) heart failure: Secondary | ICD-10-CM

## 2010-10-15 DIAGNOSIS — I5033 Acute on chronic diastolic (congestive) heart failure: Secondary | ICD-10-CM | POA: Insufficient documentation

## 2010-10-15 DIAGNOSIS — I442 Atrioventricular block, complete: Secondary | ICD-10-CM

## 2010-10-15 DIAGNOSIS — I480 Paroxysmal atrial fibrillation: Secondary | ICD-10-CM | POA: Insufficient documentation

## 2010-10-15 DIAGNOSIS — I4891 Unspecified atrial fibrillation: Secondary | ICD-10-CM

## 2010-10-15 DIAGNOSIS — I10 Essential (primary) hypertension: Secondary | ICD-10-CM

## 2010-10-15 NOTE — Assessment & Plan Note (Signed)
She does have some diastolic heart failure symptoms, predominantly right-sided greater than left-sided. I discussed the importance of a low salt diet and that the patient continue her current medical therapy. Should she have increasing peripheral edema, she has been instructed to take some additional Demadex.

## 2010-10-15 NOTE — Assessment & Plan Note (Signed)
On amiodarone, she has had no recurrent symptoms. She will continue her low dose of this drug. We'll see her back in several months.

## 2010-10-15 NOTE — Patient Instructions (Signed)
Your physician wants you to follow-up in: March 2013 with DrTaylor You will receive a reminder letter in the mail two months in advance. If you don't receive a letter, please call our office to schedule the follow-up appointment.

## 2010-10-15 NOTE — Assessment & Plan Note (Signed)
Her device is working normally. We'll recheck in several months. 

## 2010-10-15 NOTE — Progress Notes (Signed)
HPI [name] returns today for followup. She is a pleasant 77 year old woman with a history of symptomatic bradycardia and paroxysmal atrial fibrillation hypertension and dyslipidemia. She has chronic diastolic heart failure. The patient denies chest pain, shortness of breath, or syncope. The patient does have intermittent peripheral edema. She admits to dietary indiscretion with sodium. Allergies  Allergen Reactions  . Codeine Hives  . Darvocet (Propoxyphene N-Acetaminophen) Hives  . Darvon Hives  . Nubain (Nalbuphine Hcl) Hives     Current Outpatient Prescriptions  Medication Sig Dispense Refill  . alendronate (FOSAMAX) 70 MG tablet Take 70 mg by mouth once a week. Take with a full glass of water on an empty stomach.       Marland Kitchen amiodarone (PACERONE) 200 MG tablet Take 200 mg by mouth daily. 1/2 daily       . aspirin 81 MG EC tablet Take 81 mg by mouth.        Marland Kitchen BIOTIN 5000 PO Take by mouth.        . calcitRIOL (ROCALTROL) 0.25 MCG capsule Take 0.25 mcg by mouth daily.        . carvedilol (COREG) 25 MG tablet Take 25 mg by mouth 2 (two) times daily with a meal.        . cetirizine (ZYRTEC) 10 MG tablet Take 10 mg by mouth daily.        . Cholecalciferol (VITAMIN D) 1000 UNITS capsule Take 1,000 Units by mouth daily.        Marland Kitchen docusate sodium (COLACE) 50 MG capsule Stool softner as directed       . donepezil (ARICEPT) 10 MG tablet Take 10 mg by mouth at bedtime as needed.        Marland Kitchen esomeprazole (NEXIUM) 40 MG capsule Take 40 mg by mouth daily before breakfast.        . HYDROcodone-acetaminophen (NORCO) 10-325 MG per tablet Take 1 tablet by mouth every 6 (six) hours as needed.        Marland Kitchen levothyroxine (SYNTHROID, LEVOTHROID) 100 MCG tablet Take 100 mcg by mouth daily.        . memantine (NAMENDA) 10 MG tablet Take 10 mg by mouth 2 (two) times daily.        . Multiple Vitamin (MULTIVITAMIN) tablet Take 1 tablet by mouth daily.        . nitroGLYCERIN (NITROSTAT) 0.4 MG SL tablet Place 0.4 mg  under the tongue every 5 (five) minutes as needed.        . pravastatin (PRAVACHOL) 40 MG tablet Take 40 mg by mouth daily.        . pregabalin (LYRICA) 75 MG capsule Take 75 mg by mouth 2 (two) times daily.        Marland Kitchen telmisartan (MICARDIS) 40 MG tablet Take 40 mg by mouth daily.        Marland Kitchen torsemide (DEMADEX) 10 MG tablet Take 10 mg by mouth daily.        . vitamin B-12 (CYANOCOBALAMIN) 1000 MCG tablet Take 1,000 mcg by mouth daily.        Marland Kitchen warfarin (COUMADIN) 3 MG tablet As directed       . DISCONTD: calcium acetate (PHOSLO) 667 MG capsule Take 667 mg by mouth 3 (three) times daily with meals.        Marland Kitchen DISCONTD: carvedilol (COREG) 6.25 MG tablet Take 6.25 mg by mouth 2 (two) times daily with a meal.        . DISCONTD: darbepoetin [place],  ALB FREE, SURECLICK) 100 MCG/0.5ML SOLN Inject 100 mcg into the skin every 7 (seven) days.           Past Medical History  Diagnosis Date  . Syncope   . Symptomatic bradycardia   . Acute on chronic renal failure   . Chronic lower back pain   . Nonischemic cardiomyopathy     history  . Moderate aortic stenosis   . HTN (hypertension)   . HLD (hyperlipidemia)   . Atrial fibrillation   . CKD (chronic kidney disease)   . H/O: stroke   . Pulmonary embolism     HISTORY OF, the pt. had a recurrent bilateral pulmonary emboli in 2005, on warfarin therapy and at which time she under went implantation of IVC filter  . Anemia   . Dementia   . Gastroesophageal reflux disease     ROS:   All systems reviewed and negative except as noted in the HPI.   Past Surgical History  Procedure Date  . Cardiac defibrillator placement 06/26/10    medtronic     No family history on file.   History   Social History  . Marital Status: Divorced    Spouse Name: N/A    Number of Children: N/A  . Years of Education: N/A   Occupational History  . Not on file.   Social History Main Topics  . Smoking status: Never Smoker   . Smokeless tobacco: Not on file  .  Alcohol Use: Not on file  . Drug Use: Not on file  . Sexually Active: Not on file   Other Topics Concern  . Not on file   Social History Narrative  . No narrative on file     BP 179/83  Pulse 82  Ht 5\' 1"  (1.549 m)  Wt 148 lb (67.132 kg)  BMI 27.96 kg/m2  Physical Exam:  Well appearing NAD HEENT: Unremarkable Neck:  No JVD, no thyromegally Lymphatics:  No adenopathy Back:  No CVA tenderness Lungs:  Clear. Well-healed pacemaker incision. HEART:  Regular rate rhythm, no murmurs, no rubs, no clicks Abd:  Flat, positive bowel sounds, no organomegally, no rebound, no guarding Ext:  2 plus pulses, no edema, no cyanosis, no clubbing Skin:  No rashes no nodules Neuro:  CN II through XII intact, motor grossly intact  DEVICE  Normal device function.  See PaceArt for details.   Assess/Plan:

## 2010-10-21 ENCOUNTER — Encounter: Payer: Self-pay | Admitting: Internal Medicine

## 2010-10-25 ENCOUNTER — Telehealth: Payer: Self-pay | Admitting: Internal Medicine

## 2010-10-25 NOTE — Telephone Encounter (Signed)
Patient's son states pt. Is taken coumadin. INR is hard to control. Pt. INR check at [facility] and associate. The Pharmacist there recommended for pt. To ask Dr. Ladona Ridgel if pt. can be put on Xarelto in place of coumadin and stop the Amiodarone. I let son know I will send this message to MD and his nurse desktop.

## 2010-10-25 NOTE — Telephone Encounter (Signed)
Per pt son calling wants to discuss medication.

## 2010-10-29 NOTE — Telephone Encounter (Signed)
Discussed with Dr Ladona Ridgel He said to continue on Amiodarone  If the patient wants to start Xarelto we will need to have a recent BMP to know her serum creatinine  Left message on machine for patient's son to call me back

## 2010-10-30 NOTE — Telephone Encounter (Signed)
Virgina Evener Pharm D with [facility] is the one who suggested Xarelto I have told the patients sont Dr Lubertha Basque recommendations he will let Brain and Dr Allena Katz know and call us back if necessary The have been following her kidney function and will fax over to [place] for [place] to have in her chart They will start the new medication

## 2011-02-03 LAB — BASIC METABOLIC PANEL
BUN: 28 — ABNORMAL HIGH
BUN: 30 — ABNORMAL HIGH
BUN: 49 — ABNORMAL HIGH
CO2: 19
CO2: 19
CO2: 21
CO2: 24
CO2: 27
Calcium: 8.4
Calcium: 8.5
Calcium: 8.6
Chloride: 110
Chloride: 117 — ABNORMAL HIGH
Chloride: 119 — ABNORMAL HIGH
Chloride: 110
Creatinine, Ser: 1.35 — ABNORMAL HIGH
Creatinine, Ser: 1.38 — ABNORMAL HIGH
Creatinine, Ser: 1.85 — ABNORMAL HIGH
Creatinine, Ser: 1.95 — ABNORMAL HIGH
GFR calc Af Amer: 32 — ABNORMAL LOW
GFR calc Af Amer: 45 — ABNORMAL LOW
GFR calc Af Amer: 47 — ABNORMAL LOW
GFR calc Af Amer: 31 — ABNORMAL LOW
GFR calc non Af Amer: 27 — ABNORMAL LOW
GFR calc non Af Amer: 32 — ABNORMAL LOW
GFR calc non Af Amer: 38 — ABNORMAL LOW
GFR calc non Af Amer: 39 — ABNORMAL LOW
Glucose, Bld: 105 — ABNORMAL HIGH
Glucose, Bld: 91
Glucose, Bld: 96
Glucose, Bld: 96
Potassium: 3.4 — ABNORMAL LOW
Potassium: 3.5
Potassium: 4.3
Potassium: 4.6
Potassium: 4.4
Sodium: 139
Sodium: 141
Sodium: 142
Sodium: 143

## 2011-02-03 LAB — CBC
HCT: 25.3 — ABNORMAL LOW
HCT: 25.5 — ABNORMAL LOW
HCT: 26 — ABNORMAL LOW
HCT: 25.9 — ABNORMAL LOW
HCT: 27.6 — ABNORMAL LOW
HCT: 29 — ABNORMAL LOW
Hemoglobin: 8.6 — ABNORMAL LOW
Hemoglobin: 9.3 — ABNORMAL LOW
Hemoglobin: 9.9 — ABNORMAL LOW
MCHC: 33.7
MCHC: 34
MCHC: 33.5
MCHC: 34.1
MCV: 93.2
MCV: 93.4
MCV: 94.5
MCV: 93.4
MCV: 93.9
Platelets: 298
Platelets: 270
Platelets: 344
RBC: 2.7 — ABNORMAL LOW
RBC: 2.72 — ABNORMAL LOW
RBC: 2.79 — ABNORMAL LOW
RBC: 2.94 — ABNORMAL LOW
RBC: 3.1 — ABNORMAL LOW
RDW: 13.7
RDW: 13.3
RDW: 13.5
WBC: 6.2
WBC: 6.5
WBC: 5.9
WBC: 6.7
WBC: 8.2

## 2011-02-03 LAB — FOLATE: Folate: >20

## 2011-02-03 LAB — PROTIME-INR
INR: 1.5
INR: 1.6 — ABNORMAL HIGH
INR: 2.2 — ABNORMAL HIGH
INR: 2.3 — ABNORMAL HIGH
INR: 2.4 — ABNORMAL HIGH
Prothrombin Time: 18.3 — ABNORMAL HIGH
Prothrombin Time: 19.3 — ABNORMAL HIGH
Prothrombin Time: 24.4 — ABNORMAL HIGH
Prothrombin Time: 25.8 — ABNORMAL HIGH
Prothrombin Time: 27.2 — ABNORMAL HIGH

## 2011-02-03 LAB — DIFFERENTIAL
Basophils Absolute: 0
Basophils Relative: 0
Eosinophils Relative: 1
Lymphocytes Relative: 21
Monocytes Absolute: 0.7
Neutro Abs: 5.7

## 2011-02-03 LAB — COMPREHENSIVE METABOLIC PANEL
ALT: 16
AST: 18
Albumin: 3 — ABNORMAL LOW
Alkaline Phosphatase: 36 — ABNORMAL LOW
Alkaline Phosphatase: 46
BUN: 104 — ABNORMAL HIGH
CO2: 31
Chloride: 96
Chloride: 98
Creatinine, Ser: 2.69 — ABNORMAL HIGH
GFR calc non Af Amer: 17 — ABNORMAL LOW
Potassium: 3 — ABNORMAL LOW
Potassium: 3 — ABNORMAL LOW
Sodium: 140
Total Bilirubin: 0.6
Total Bilirubin: 0.6
Total Protein: 5.6 — ABNORMAL LOW

## 2011-02-03 LAB — C3 COMPLEMENT: C3 Complement: 90

## 2011-02-03 LAB — LIPASE, BLOOD: Lipase: 22

## 2011-02-03 LAB — B-NATRIURETIC PEPTIDE (CONVERTED LAB): Pro B Natriuretic peptide (BNP): 122 — ABNORMAL HIGH

## 2011-02-03 LAB — IRON AND TIBC
Iron: 76
Saturation Ratios: 23
TIBC: 328

## 2011-02-03 LAB — APTT
aPTT: 38 — ABNORMAL HIGH
aPTT: 54 — ABNORMAL HIGH

## 2011-02-03 LAB — D-DIMER, QUANTITATIVE: D-Dimer, Quant: 0.95 — ABNORMAL HIGH

## 2011-02-03 LAB — TSH: TSH: 1.279

## 2011-02-03 LAB — C4 COMPLEMENT: Complement C4, Body Fluid: 18

## 2011-08-22 ENCOUNTER — Telehealth: Payer: Self-pay | Admitting: Cardiology

## 2011-08-22 NOTE — Telephone Encounter (Signed)
[facility] calling re refill they requested twice with no response, lanoxin .125mg , toprol 50mg , furosimide 20mg , and nitrostat, pt out of lanoxin, needs called in asap

## 2011-08-26 ENCOUNTER — Other Ambulatory Visit: Payer: Self-pay

## 2011-08-26 NOTE — Telephone Encounter (Signed)
Error was made.This request for refills is not for this patient.

## 2011-08-26 NOTE — Telephone Encounter (Signed)
Called this patient and spoke to her son Onalee Hua.  The patient is not on any of the medications listed below.  Called and spoke to pharmacist Kathlene November and this patient has not had medication filled at this pharmacy for over 6 years.  We are not sure if there was a patient error upon call in, but this has been cleared with her son Onalee Hua, that no med refills are needed at this time.   Vista Mink, CMA

## 2011-08-26 NOTE — Telephone Encounter (Signed)
Maybe you might know more on this than I do. No one seems to have erased it, not sure if it was resolved. I wasn't able to get in touch with the patient.  Maytal Mijangos CMA

## 2011-09-24 ENCOUNTER — Ambulatory Visit (INDEPENDENT_AMBULATORY_CARE_PROVIDER_SITE_OTHER): Payer: Medicare Other | Admitting: Internal Medicine

## 2011-09-24 ENCOUNTER — Encounter: Payer: Self-pay | Admitting: Internal Medicine

## 2011-09-24 VITALS — BP 112/79 | HR 89 | Wt 154.0 lb

## 2011-09-24 DIAGNOSIS — I480 Paroxysmal atrial fibrillation: Secondary | ICD-10-CM

## 2011-09-24 DIAGNOSIS — Z950 Presence of cardiac pacemaker: Secondary | ICD-10-CM

## 2011-09-24 DIAGNOSIS — I4891 Unspecified atrial fibrillation: Secondary | ICD-10-CM

## 2011-09-24 DIAGNOSIS — I5032 Chronic diastolic (congestive) heart failure: Secondary | ICD-10-CM

## 2011-09-24 LAB — PACEMAKER DEVICE OBSERVATION
ATRIAL PACING PM: 98
BAMS-0001: 150 {beats}/min
BATTERY VOLTAGE: 2.78 V
VENTRICULAR PACING PM: 27

## 2011-09-24 NOTE — Patient Instructions (Signed)
Your physician wants you to follow-up in: 1 year with Dr. Ladona Ridgel. You will receive a reminder letter in the mail two months in advance. If you don't receive a letter, please call our office to schedule the follow-up appointment.  Remote monitoring is used to monitor your Pacemaker of ICD from home. This monitoring reduces the number of office visits required to check your device to one time per year. It allows [place] to keep an eye on the functioning of your device to ensure it is working properly. You are scheduled for a device check from home on January 01, 2012. You may send your transmission at any time that day. If you have a wireless device, the transmission will be sent automatically. After your physician reviews your transmission, you will receive a postcard with your next transmission date.

## 2011-09-24 NOTE — Progress Notes (Signed)
HPI [name] returns today for followup. She is a very pleasant 78 year old woman with symptomatic tachybradycardia syndrome, status post permanent pacemaker insertion. She has paroxysmal atrial fibrillation which has been well-controlled on amiodarone therapy. She denies chest pain, shortness of breath, or peripheral edema. No syncope. Allergies  Allergen Reactions  . Codeine Hives  . Darvocet (Propoxyphene-Acetaminophen) Hives  . Darvon Hives  . Nubain (Nalbuphine Hcl) Hives     Current Outpatient Prescriptions  Medication Sig Dispense Refill  . alendronate (FOSAMAX) 70 MG tablet Take 70 mg by mouth once a week. Take with a full glass of water on an empty stomach.       Marland Kitchen amiodarone (PACERONE) 200 MG tablet Take 100 mg by mouth daily.       Marland Kitchen BIOTIN 5000 PO Take by mouth.        . calcitRIOL (ROCALTROL) 0.25 MCG capsule Take 0.25 mcg by mouth daily.        . carvedilol (COREG) 25 MG tablet Take 25 mg by mouth 2 (two) times daily with a meal.        . cetirizine (ZYRTEC) 10 MG tablet Take 10 mg by mouth daily.        . Cholecalciferol (VITAMIN D) 1000 UNITS capsule Take 1,000 Units by mouth daily.        Marland Kitchen docusate sodium (COLACE) 50 MG capsule Stool softner as directed       . donepezil (ARICEPT) 10 MG tablet Take 10 mg by mouth at bedtime as needed.        . DULoxetine (CYMBALTA) 60 MG capsule Take 60 mg by mouth daily.      Marland Kitchen esomeprazole (NEXIUM) 40 MG capsule Take 40 mg by mouth daily before breakfast.        . HYDROcodone-acetaminophen (NORCO) 10-325 MG per tablet Take 1 tablet by mouth every 6 (six) hours as needed.        Marland Kitchen levothyroxine (SYNTHROID, LEVOTHROID) 100 MCG tablet Take 100 mcg by mouth daily.        . memantine (NAMENDA) 10 MG tablet Take 10 mg by mouth 2 (two) times daily.        . Multiple Vitamin (MULTIVITAMIN) tablet Take 1 tablet by mouth daily.        . nitroGLYCERIN (NITROSTAT) 0.4 MG SL tablet Place 0.4 mg under the tongue every 5 (five) minutes as  needed.        . pravastatin (PRAVACHOL) 40 MG tablet Take 40 mg by mouth daily.        . pregabalin (LYRICA) 75 MG capsule Take 75 mg by mouth 2 (two) times daily.        Marland Kitchen telmisartan (MICARDIS) 40 MG tablet Take 20 mg by mouth daily.       Marland Kitchen torsemide (DEMADEX) 20 MG tablet Take 20 mg by mouth daily.      . vitamin B-12 (CYANOCOBALAMIN) 1000 MCG tablet Take 1,000 mcg by mouth daily.        Marland Kitchen warfarin (COUMADIN) 3 MG tablet As directed          Past Medical History  Diagnosis Date  . Syncope   . Symptomatic bradycardia   . Acute on chronic renal failure   . Chronic lower back pain   . Nonischemic cardiomyopathy     history  . Moderate aortic stenosis   . HTN (hypertension)   . HLD (hyperlipidemia)   . Atrial fibrillation   . CKD (chronic kidney disease)   .  H/O: stroke   . Pulmonary embolism     HISTORY OF, the pt. had a recurrent bilateral pulmonary emboli in 2005, on warfarin therapy and at which time she under went implantation of IVC filter  . Anemia   . Dementia   . Gastroesophageal reflux disease     ROS:   All systems reviewed and negative except as noted in the HPI.   Past Surgical History  Procedure Date  . Cardiac defibrillator placement 06/26/10    medtronic     No family history on file.   History   Social History  . Marital Status: Divorced    Spouse Name: N/A    Number of Children: N/A  . Years of Education: N/A   Occupational History  . Not on file.   Social History Main Topics  . Smoking status: Never Smoker   . Smokeless tobacco: Not on file  . Alcohol Use: Not on file  . Drug Use: Not on file  . Sexually Active: Not on file   Other Topics Concern  . Not on file   Social History Narrative  . No narrative on file     BP 112/79  Pulse 89  Wt 154 lb (69.854 kg)  Physical Exam:  Well appearing 78 year old woman, NAD HEENT: Unremarkable Neck:  No JVD, no thyromegally Lungs:  Clear with no wheezes, rales, or rhonchi. HEART:   Regular rate rhythm, no murmurs, no rubs, no clicks Abd:  soft, positive bowel sounds, no organomegally, no rebound, no guarding Ext:  2 plus pulses, no edema, no cyanosis, no clubbing Skin:  No rashes no nodules Neuro:  CN II through XII intact, motor grossly intact  DEVICE  Normal device function.  See PaceArt for details.   Assess/Plan:

## 2011-09-24 NOTE — Assessment & Plan Note (Signed)
The patient is maintaining sinus rhythm very nicely. She is in atrial fibrillation less than 0.1% of the time. She will continue her low-dose amiodarone. She will continue anticoagulation.

## 2011-09-24 NOTE — Assessment & Plan Note (Signed)
Her heart failure symptoms are class II. She will continue her current medical therapy, and maintain a low-sodium diet. 

## 2011-09-24 NOTE — Assessment & Plan Note (Signed)
Her device is working normally. We'll plan to recheck in several months. 

## 2012-01-01 ENCOUNTER — Encounter: Payer: Medicare Other | Admitting: *Deleted

## 2012-01-16 ENCOUNTER — Encounter: Payer: Self-pay | Admitting: *Deleted

## 2012-04-29 ENCOUNTER — Encounter: Payer: Self-pay | Admitting: *Deleted

## 2012-05-20 ENCOUNTER — Telehealth: Payer: Self-pay | Admitting: Internal Medicine

## 2012-05-20 NOTE — Telephone Encounter (Deleted)
New Problem:    Patient's son called in because his mother received a home monitor with no instructions and wanted to know how to proceed.  Please call back.

## 2012-05-24 ENCOUNTER — Encounter: Payer: Self-pay | Admitting: Internal Medicine

## 2012-05-24 ENCOUNTER — Ambulatory Visit (INDEPENDENT_AMBULATORY_CARE_PROVIDER_SITE_OTHER): Payer: PRIVATE HEALTH INSURANCE | Admitting: *Deleted

## 2012-05-24 DIAGNOSIS — I4891 Unspecified atrial fibrillation: Secondary | ICD-10-CM

## 2012-05-24 DIAGNOSIS — I480 Paroxysmal atrial fibrillation: Secondary | ICD-10-CM

## 2012-05-24 DIAGNOSIS — I498 Other specified cardiac arrhythmias: Secondary | ICD-10-CM

## 2012-05-24 LAB — PACEMAKER DEVICE OBSERVATION
AL AMPLITUDE: 2.8 mv
AL THRESHOLD: 0.5 V
BAMS-0001: 150 {beats}/min
BATTERY VOLTAGE: 2.78 V
RV LEAD AMPLITUDE: 8 mv

## 2012-05-24 NOTE — Progress Notes (Signed)
PPM check 

## 2012-06-10 ENCOUNTER — Ambulatory Visit (HOSPITAL_COMMUNITY)
Admission: RE | Admit: 2012-06-10 | Discharge: 2012-06-10 | Disposition: A | Discharge status: Home / Self Care | Payer: PRIVATE HEALTH INSURANCE | Source: Ambulatory Visit | Attending: Specialist | Admitting: Specialist

## 2012-06-10 ENCOUNTER — Other Ambulatory Visit (HOSPITAL_COMMUNITY): Payer: Self-pay | Admitting: Specialist

## 2012-06-10 ENCOUNTER — Encounter: Payer: Self-pay | Admitting: Internal Medicine

## 2012-06-10 DIAGNOSIS — M79609 Pain in unspecified limb: Secondary | ICD-10-CM

## 2012-06-10 DIAGNOSIS — M7989 Other specified soft tissue disorders: Secondary | ICD-10-CM | POA: Insufficient documentation

## 2012-06-10 DIAGNOSIS — M25569 Pain in unspecified knee: Secondary | ICD-10-CM

## 2012-06-10 NOTE — Progress Notes (Signed)
Right lower extremity venous duplex has been completed.   Preliminary report= Right:  No evidence of DVT, superficial thrombosis, or Baker's cyst.  Called report to [facility] at Dr. Thomasena Edis' office.  Farrel Demark, RDMS, RVT 06/10/2012

## 2012-09-21 ENCOUNTER — Ambulatory Visit (INDEPENDENT_AMBULATORY_CARE_PROVIDER_SITE_OTHER): Payer: PRIVATE HEALTH INSURANCE | Admitting: Cardiology

## 2012-09-21 ENCOUNTER — Encounter: Payer: Self-pay | Admitting: Cardiology

## 2012-09-21 VITALS — BP 151/85 | HR 81 | Ht 60.0 in | Wt 154.0 lb

## 2012-09-21 DIAGNOSIS — I4891 Unspecified atrial fibrillation: Secondary | ICD-10-CM

## 2012-09-21 DIAGNOSIS — I480 Paroxysmal atrial fibrillation: Secondary | ICD-10-CM

## 2012-09-21 DIAGNOSIS — I350 Nonrheumatic aortic (valve) stenosis: Secondary | ICD-10-CM

## 2012-09-21 DIAGNOSIS — Z950 Presence of cardiac pacemaker: Secondary | ICD-10-CM

## 2012-09-21 DIAGNOSIS — I495 Sick sinus syndrome: Secondary | ICD-10-CM

## 2012-09-21 DIAGNOSIS — R0609 Other forms of dyspnea: Secondary | ICD-10-CM

## 2012-09-21 DIAGNOSIS — I359 Nonrheumatic aortic valve disorder, unspecified: Secondary | ICD-10-CM

## 2012-09-21 LAB — PACEMAKER DEVICE OBSERVATION
AL AMPLITUDE: 2 mv
AL IMPEDENCE PM: 560 Ohm
BAMS-0001: 150 {beats}/min
BATTERY VOLTAGE: 2.78 V
RV LEAD AMPLITUDE: 11.2 mv
VENTRICULAR PACING PM: 22.4

## 2012-09-21 NOTE — Patient Instructions (Signed)
Your physician has requested that you have an echocardiogram. Echocardiography is a painless test that uses sound waves to create images of your heart. It provides your doctor with information about the size and shape of your heart and how well your heart's chambers and valves are working. This procedure takes approximately one hour. There are no restrictions for this procedure.  Your physician recommends that you  follow-up in the Device Clinic with Gunnar Fusi and Belenda Cruise : 03/23/2013 @ 2:00 pm.  Your physician wants you to follow-up in: 1 year with Dr. Ladona Ridgel. You will receive a reminder letter in the mail two months in advance. If you don't receive a letter, please call our office to schedule the follow-up appointment.  Your physician recommends that you continue on your current medications as directed. Please refer to the Current Medication list given to you today.

## 2012-09-21 NOTE — Progress Notes (Signed)
ELECTROPHYSIOLOGY OFFICE NOTE  Patient ID: [name] MRN: [id number], DOB/AGE: [birth year]   Date of Visit: 09/21/2012  Primary Physician: Pearson Grippe, MD Primary Cardiologist: Lewayne Bunting, MD Reason for Visit: EP/device follow-up  History of Present Illness  [name] is a very pleasant 79 year old woman with tachy-brady syndrome s/p PPM implant, PAF, moderate AS by echo 2011 and chronic diastolic HF who presents today for routine electrophysiology followup. Since last being seen in our clinic, she reports increased DOE. Her DOE has gradually worsened over the last year, specifically with walking. She denies SOB at rest. She denies chest pain. She denies palpitations, dizziness, near syncope or syncope. She denies LE swelling, orthopnea, PND or recent weight gain. [name] reports that she is compliant and tolerating medications without difficulty. Her Coumadin is followed by her PCP.   Past Medical History Past Medical History  Diagnosis Date  . Syncope   . Symptomatic bradycardia   . Acute on chronic renal failure   . Chronic lower back pain   . Nonischemic cardiomyopathy     history  . Moderate aortic stenosis   . HTN (hypertension)   . HLD (hyperlipidemia)   . Atrial fibrillation   . CKD (chronic kidney disease)   . H/O: stroke   . Pulmonary embolism     HISTORY OF, the pt. had a recurrent bilateral pulmonary emboli in 2005, on warfarin therapy and at which time she under went implantation of IVC filter  . Anemia   . Dementia   . Gastroesophageal reflux disease     Past Surgical History Past Surgical History  Procedure Laterality Date  . Cardiac defibrillator placement  06/26/10    medtronic    Allergies/Intolerances Allergies  Allergen Reactions  . Codeine Hives  . Darvocet (Propoxyphene-Acetaminophen) Hives  . Darvon Hives  . Nubain (Nalbuphine Hcl) Hives   Current Home Medications Current Outpatient Prescriptions  Medication Sig Dispense Refill   . alendronate (FOSAMAX) 70 MG tablet Take 70 mg by mouth once a week. Take with a full glass of water on an empty stomach.       Marland Kitchen amiodarone (PACERONE) 200 MG tablet Take 100 mg by mouth daily.       Marland Kitchen BIOTIN 5000 PO Take by mouth.        . calcitRIOL (ROCALTROL) 0.25 MCG capsule Take 0.25 mcg by mouth daily.        . carvedilol (COREG) 25 MG tablet Take 25 mg by mouth 2 (two) times daily with a meal.        . cetirizine (ZYRTEC) 10 MG tablet Take 10 mg by mouth daily.        . Cholecalciferol (VITAMIN D) 1000 UNITS capsule Take 2,000 Units by mouth daily.       Marland Kitchen docusate sodium (COLACE) 50 MG capsule Stool softner as directed       . donepezil (ARICEPT) 10 MG tablet Take 10 mg by mouth at bedtime as needed.        . DULoxetine (CYMBALTA) 60 MG capsule Take 60 mg by mouth daily.      Marland Kitchen esomeprazole (NEXIUM) 40 MG capsule Take 40 mg by mouth daily before breakfast.        . HYDROcodone-acetaminophen (NORCO) 10-325 MG per tablet Take 1 tablet by mouth every 6 (six) hours as needed.        Marland Kitchen levothyroxine (SYNTHROID, LEVOTHROID) 100 MCG tablet Take 100 mcg by mouth daily.        Marland Kitchen  Linaclotide 290 MCG CAPS Take 290 mcg by mouth daily.      . memantine (NAMENDA) 10 MG tablet Take 10 mg by mouth 2 (two) times daily.        . Multiple Vitamin (MULTIVITAMIN) tablet Take 1 tablet by mouth daily.        . nitroGLYCERIN (NITROSTAT) 0.4 MG SL tablet Place 0.4 mg under the tongue every 5 (five) minutes as needed.        . pravastatin (PRAVACHOL) 40 MG tablet Take 40 mg by mouth daily.        . pregabalin (LYRICA) 75 MG capsule Take 75 mg by mouth 2 (two) times daily.        Marland Kitchen telmisartan (MICARDIS) 40 MG tablet Take 20 mg by mouth daily.       Marland Kitchen torsemide (DEMADEX) 20 MG tablet Take 20 mg by mouth daily.      . vitamin B-12 (CYANOCOBALAMIN) 1000 MCG tablet Take 1,000 mcg by mouth 2 (two) times a week.       . warfarin (COUMADIN) 3 MG tablet As directed        No current facility-administered  medications for this visit.   Social History Social History  . Marital Status: Divorced   Social History Main Topics  . Smoking status: Never Smoker   . Smokeless tobacco: No  . Alcohol Use: No  . Drug Use: No   Review of Systems General: No chills, fever, night sweats or weight changes Cardiovascular: No chest pain, edema, orthopnea, palpitations, paroxysmal nocturnal dyspnea Dermatological: No rash, lesions or masses Respiratory: No cough, dyspnea Urologic: No hematuria, dysuria Abdominal: No nausea, vomiting, diarrhea, bright red blood per rectum, melena, or hematemesis Neurologic: No visual changes, weakness, changes in mental status All other systems reviewed and are otherwise negative except as noted above.  Physical Exam Blood pressure 151/85, pulse 81, height 5' (1.524 m), weight 154 lb (69.854 kg).  General: Well developed, well appearing 79 year old female in no acute distress. HEENT: Normocephalic, atraumatic. EOMs intact. Sclera nonicteric. Oropharynx clear.  Neck: Supple. No JVD. Lungs: Respirations regular and unlabored, CTA bilaterally. No wheezes, rales or rhonchi. Heart: RRR. S1, S2 present. III/VI harsh systolic murmur heard best at right upper sternal border. No rub, S3 or S4. Abdomen: Soft, non-distended.  Extremities: No clubbing, cyanosis or edema. PT/Radials 2+ and equal bilaterally. Psych: Normal affect. Neuro: Alert and oriented X 3. Moves all extremities spontaneously.   Diagnostics Device interrogation today - Normal device function. Thresholds, sensing, impedances consistent with previous measurements. Device programmed to maximize longevity. No mode switch or high ventricular rates noted. Device programmed at appropriate safety margins. Histogram distribution appropriate for patient activity level. Device programmed to optimize intrinsic conduction. Estimated longevity 9 years  Assessment and Plan 1. DOE [name] has chronic DOE but states it  has gradually worsened in the last year. Etiology unclear at this time but there are several possibilities. She has h/o moderate AS by echo in 2011. I will update her echo to evaluate her aortic valve and LV function. She has multiple risk factors for CAD and her DOE may be an anginal equivalent. If her echo shows stable AS, I will order a Lexiscan Myoview for cardiac risk stratification. She has h/o anemia but recent labs show her Hgb/Hct are stable. Her PPM function is normal and she has not had any recurrent AF. She is maintaining SR on low dose amiodarone. Her recent LFTs and TSH were normal. She should  also have PFTs done. 2. Tachy-brady syndrome s/p PPM implant Normal device function No programming changes made Return for routine PPM follow-up in device clinic in 6 months Follow-up with Dr. Ladona Ridgel in one year unless needed sooner pending the results of the above-mentioned studies 3. PAF Stable without recurrence Maintaining SR on low dose amiodarone As above, recent labs (Feb 2014) show normal LFTs and TSH and will order PFTs Continue warfarin for embolic prophylaxis   Signed, Darlene Brozowski, PA-C 09/21/2012, 3:35 PM

## 2012-09-22 ENCOUNTER — Ambulatory Visit (HOSPITAL_COMMUNITY): Payer: PRIVATE HEALTH INSURANCE | Attending: Cardiology

## 2012-09-22 DIAGNOSIS — I495 Sick sinus syndrome: Secondary | ICD-10-CM

## 2012-09-22 DIAGNOSIS — R0602 Shortness of breath: Secondary | ICD-10-CM

## 2012-09-22 DIAGNOSIS — R0989 Other specified symptoms and signs involving the circulatory and respiratory systems: Secondary | ICD-10-CM | POA: Insufficient documentation

## 2012-09-22 DIAGNOSIS — I480 Paroxysmal atrial fibrillation: Secondary | ICD-10-CM

## 2012-09-22 DIAGNOSIS — I350 Nonrheumatic aortic (valve) stenosis: Secondary | ICD-10-CM

## 2012-09-22 DIAGNOSIS — I359 Nonrheumatic aortic valve disorder, unspecified: Secondary | ICD-10-CM | POA: Insufficient documentation

## 2012-09-22 DIAGNOSIS — Z950 Presence of cardiac pacemaker: Secondary | ICD-10-CM

## 2012-09-22 DIAGNOSIS — R0609 Other forms of dyspnea: Secondary | ICD-10-CM | POA: Insufficient documentation

## 2012-09-22 NOTE — Progress Notes (Signed)
Echocardiogram performed.  

## 2012-10-12 ENCOUNTER — Ambulatory Visit (INDEPENDENT_AMBULATORY_CARE_PROVIDER_SITE_OTHER): Payer: PRIVATE HEALTH INSURANCE | Admitting: Internal Medicine

## 2012-10-12 ENCOUNTER — Encounter: Payer: Self-pay | Admitting: Internal Medicine

## 2012-10-12 ENCOUNTER — Encounter: Payer: Self-pay | Admitting: *Deleted

## 2012-10-12 VITALS — BP 155/75 | HR 78 | Ht 60.0 in | Wt 157.8 lb

## 2012-10-12 DIAGNOSIS — I5022 Chronic systolic (congestive) heart failure: Secondary | ICD-10-CM

## 2012-10-12 DIAGNOSIS — I359 Nonrheumatic aortic valve disorder, unspecified: Secondary | ICD-10-CM

## 2012-10-12 DIAGNOSIS — I350 Nonrheumatic aortic (valve) stenosis: Secondary | ICD-10-CM

## 2012-10-12 DIAGNOSIS — I5043 Acute on chronic combined systolic (congestive) and diastolic (congestive) heart failure: Secondary | ICD-10-CM

## 2012-10-12 NOTE — Patient Instructions (Signed)

## 2012-10-13 ENCOUNTER — Encounter: Payer: Self-pay | Admitting: Internal Medicine

## 2012-10-13 ENCOUNTER — Other Ambulatory Visit: Payer: Self-pay | Admitting: *Deleted

## 2012-10-13 DIAGNOSIS — I350 Nonrheumatic aortic (valve) stenosis: Secondary | ICD-10-CM

## 2012-10-13 DIAGNOSIS — I5043 Acute on chronic combined systolic (congestive) and diastolic (congestive) heart failure: Secondary | ICD-10-CM | POA: Insufficient documentation

## 2012-10-13 HISTORY — DX: Nonrheumatic aortic (valve) stenosis: I35.0

## 2012-10-13 LAB — CBC
HCT: 34.5 % — ABNORMAL LOW (ref 36.0–46.0)
Hemoglobin: 11.8 g/dL — ABNORMAL LOW (ref 12.0–15.0)
MCV: 92.8 fl (ref 78.0–100.0)
RBC: 3.71 Mil/uL — ABNORMAL LOW (ref 3.87–5.11)
WBC: 5.2 10*3/uL (ref 4.5–10.5)

## 2012-10-13 LAB — BASIC METABOLIC PANEL
CO2: 27 mEq/L (ref 19–32)
Chloride: 96 mEq/L (ref 96–112)
Creatinine, Ser: 1.5 mg/dL — ABNORMAL HIGH (ref 0.4–1.2)
Potassium: 4.5 mEq/L (ref 3.5–5.1)

## 2012-10-13 NOTE — Progress Notes (Signed)
HPI [name] returns today for followup. She is a very pleasant 80-year-old woman, with a history of worsening shortness of breath. This is been present for over a year. She was seen in our office a week or so ago and a 2-D echo was ordered which demonstrates left ventricular dysfunction with an ejection fraction of 35%. In addition she has at least moderately severe aortic stenosis. The actual gradient was difficult to know for certain with her left ventricular dysfunction. She has dyspnea with exertion which is class III. She denies anginal symptoms. Allergies  Allergen Reactions  . Codeine Hives  . Darvocet (Propoxyphene-Acetaminophen) Hives  . Darvon Hives  . Nubain (Nalbuphine Hcl) Hives     Current Outpatient Prescriptions  Medication Sig Dispense Refill  . alendronate (FOSAMAX) 70 MG tablet Take 70 mg by mouth once a week. Take with a full glass of water on an empty stomach.       . amiodarone (PACERONE) 200 MG tablet Take 100 mg by mouth daily.       . BIOTIN 5000 PO Take by mouth.        . calcitRIOL (ROCALTROL) 0.25 MCG capsule Take 0.25 mcg by mouth daily.        . carvedilol (COREG) 25 MG tablet Take 25 mg by mouth 2 (two) times daily with a meal.        . cetirizine (ZYRTEC) 10 MG tablet Take 10 mg by mouth daily.        . Cholecalciferol (VITAMIN D) 1000 UNITS capsule Take 2,000 Units by mouth daily.       . docusate sodium (COLACE) 50 MG capsule Stool softner as directed       . donepezil (ARICEPT) 10 MG tablet Take 10 mg by mouth at bedtime as needed.        . DULoxetine (CYMBALTA) 60 MG capsule Take 60 mg by mouth daily.      . esomeprazole (NEXIUM) 40 MG capsule Take 40 mg by mouth daily before breakfast.        . HYDROcodone-acetaminophen (NORCO) 10-325 MG per tablet Take 1 tablet by mouth every 6 (six) hours as needed.        . levothyroxine (SYNTHROID, LEVOTHROID) 100 MCG tablet Take 100 mcg by mouth daily.        . Linaclotide 290 MCG CAPS Take 290 mcg by mouth  daily.      . memantine (NAMENDA) 10 MG tablet Take 10 mg by mouth 2 (two) times daily.        . Multiple Vitamin (MULTIVITAMIN) tablet Take 1 tablet by mouth daily.        . nitroGLYCERIN (NITROSTAT) 0.4 MG SL tablet Place 0.4 mg under the tongue every 5 (five) minutes as needed.        . pravastatin (PRAVACHOL) 40 MG tablet Take 40 mg by mouth daily.        . pregabalin (LYRICA) 75 MG capsule Take 75 mg by mouth 2 (two) times daily.        . telmisartan (MICARDIS) 40 MG tablet Take 20 mg by mouth daily.       . torsemide (DEMADEX) 20 MG tablet Take 20 mg by mouth daily.      . vitamin B-12 (CYANOCOBALAMIN) 1000 MCG tablet Take 1,000 mcg by mouth 2 (two) times a week.       . warfarin (COUMADIN) 3 MG tablet As directed        No current facility-administered medications for   this visit.     Past Medical History  Diagnosis Date  . Syncope   . Symptomatic bradycardia   . Acute on chronic renal failure   . Chronic lower back pain   . Nonischemic cardiomyopathy     history  . Moderate aortic stenosis   . HTN (hypertension)   . HLD (hyperlipidemia)   . Atrial fibrillation   . CKD (chronic kidney disease)   . H/O: stroke   . Pulmonary embolism     HISTORY OF, the pt. had a recurrent bilateral pulmonary emboli in 2005, on warfarin therapy and at which time she under went implantation of IVC filter  . Anemia   . Dementia   . Gastroesophageal reflux disease     ROS:   All systems reviewed and negative except as noted in the HPI.   Past Surgical History  Procedure Laterality Date  . Cardiac defibrillator placement  06/26/10    medtronic     No family history on file.   History   Social History  . Marital Status: Divorced    Spouse Name: N/A    Number of Children: N/A  . Years of Education: N/A   Occupational History  . Not on file.   Social History Main Topics  . Smoking status: Never Smoker   . Smokeless tobacco: Not on file  . Alcohol Use: Not on file  . Drug  Use: Not on file  . Sexually Active: Not on file   Other Topics Concern  . Not on file   Social History Narrative  . No narrative on file     BP 155/75  Pulse 78  Ht 5' (1.524 m)  Wt 157 lb 12.8 oz (71.578 kg)  BMI 30.82 kg/m2  Physical Exam:  Well appearing 80-year-old woman, NAD HEENT: Unremarkable Neck:  No JVD, no thyromegally Lymphatics:  No adenopathy Back:  No CVA tenderness Lungs:  Clear HEART:  Regular rate rhythm, grade 2/6 systolic murmur of aortic stenosis, no rubs, no clicks Abd:  soft, positive bowel sounds, no organomegally, no rebound, no guarding Ext:  2 plus pulses, no edema, no cyanosis, no clubbing Skin:  No rashes no nodules Neuro:  CN II through XII intact, motor grossly intact   DEVICE  Normal device function.  See PaceArt for details.   Assess/Plan: 

## 2012-10-13 NOTE — Assessment & Plan Note (Signed)
Her aortic stenosis is at least moderately severe and I suspect severe. Evaluation of the severity is made more difficult by her left ventricular dysfunction. He already of her dysfunction is uncertain. I've recommended that she undergo right and left heart catheterization. A dobutamine echo would be a consideration following the catheterization to better characterize the severity of her aortic stenosis.

## 2012-10-13 NOTE — Assessment & Plan Note (Signed)
She has worsening systolic heart failure. Her symptoms are class III. Medical therapy will be based on the results of her catheterization.

## 2012-10-14 ENCOUNTER — Telehealth: Payer: Self-pay | Admitting: *Deleted

## 2012-10-14 ENCOUNTER — Encounter (HOSPITAL_COMMUNITY): Payer: Self-pay | Admitting: Pharmacy Technician

## 2012-10-14 NOTE — Telephone Encounter (Signed)
Called patient to discuss Lovenox bridge.  Her son was on his way to the drug stor to pick up the medication.  Dr Selena Batten who follows her INR's had already addressed the bridging.  Her cath is set for 10/18/12  Dr Excell Seltzer aware

## 2012-10-18 ENCOUNTER — Ambulatory Visit (HOSPITAL_COMMUNITY)
Admission: RE | Admit: 2012-10-18 | Discharge: 2012-10-18 | Disposition: A | Discharge status: Home / Self Care | Payer: PRIVATE HEALTH INSURANCE | Source: Ambulatory Visit | Attending: Cardiovascular Disease | Admitting: Cardiovascular Disease

## 2012-10-18 ENCOUNTER — Encounter (HOSPITAL_COMMUNITY)
Admission: RE | Disposition: A | Discharge status: Home / Self Care | Payer: Self-pay | Source: Ambulatory Visit | Attending: Cardiovascular Disease

## 2012-10-18 DIAGNOSIS — I129 Hypertensive chronic kidney disease with stage 1 through stage 4 chronic kidney disease, or unspecified chronic kidney disease: Secondary | ICD-10-CM | POA: Insufficient documentation

## 2012-10-18 DIAGNOSIS — N189 Chronic kidney disease, unspecified: Secondary | ICD-10-CM | POA: Insufficient documentation

## 2012-10-18 DIAGNOSIS — R9439 Abnormal result of other cardiovascular function study: Secondary | ICD-10-CM | POA: Insufficient documentation

## 2012-10-18 DIAGNOSIS — Z79899 Other long term (current) drug therapy: Secondary | ICD-10-CM | POA: Insufficient documentation

## 2012-10-18 DIAGNOSIS — I350 Nonrheumatic aortic (valve) stenosis: Secondary | ICD-10-CM

## 2012-10-18 DIAGNOSIS — I428 Other cardiomyopathies: Secondary | ICD-10-CM | POA: Insufficient documentation

## 2012-10-18 DIAGNOSIS — I359 Nonrheumatic aortic valve disorder, unspecified: Secondary | ICD-10-CM | POA: Insufficient documentation

## 2012-10-18 DIAGNOSIS — Z86711 Personal history of pulmonary embolism: Secondary | ICD-10-CM | POA: Insufficient documentation

## 2012-10-18 DIAGNOSIS — Z885 Allergy status to narcotic agent status: Secondary | ICD-10-CM | POA: Insufficient documentation

## 2012-10-18 DIAGNOSIS — D649 Anemia, unspecified: Secondary | ICD-10-CM | POA: Insufficient documentation

## 2012-10-18 DIAGNOSIS — Z7901 Long term (current) use of anticoagulants: Secondary | ICD-10-CM | POA: Insufficient documentation

## 2012-10-18 DIAGNOSIS — I4891 Unspecified atrial fibrillation: Secondary | ICD-10-CM | POA: Insufficient documentation

## 2012-10-18 DIAGNOSIS — Z95810 Presence of automatic (implantable) cardiac defibrillator: Secondary | ICD-10-CM | POA: Insufficient documentation

## 2012-10-18 DIAGNOSIS — F0390 Unspecified dementia without behavioral disturbance: Secondary | ICD-10-CM | POA: Insufficient documentation

## 2012-10-18 DIAGNOSIS — K219 Gastro-esophageal reflux disease without esophagitis: Secondary | ICD-10-CM | POA: Insufficient documentation

## 2012-10-18 DIAGNOSIS — I519 Heart disease, unspecified: Secondary | ICD-10-CM | POA: Insufficient documentation

## 2012-10-18 DIAGNOSIS — E785 Hyperlipidemia, unspecified: Secondary | ICD-10-CM | POA: Insufficient documentation

## 2012-10-18 HISTORY — PX: LEFT AND RIGHT HEART CATHETERIZATION WITH CORONARY ANGIOGRAM: SHX5449

## 2012-10-18 LAB — POCT I-STAT 3, VENOUS BLOOD GAS (G3P V)
Bicarbonate: 26.5 mEq/L — ABNORMAL HIGH (ref 20.0–24.0)
O2 Saturation: 59 %
pO2, Ven: 33 mmHg (ref 30.0–45.0)

## 2012-10-18 LAB — POCT I-STAT 3, ART BLOOD GAS (G3+): O2 Saturation: 94 %

## 2012-10-18 LAB — PROTIME-INR
INR: 1.08 (ref 0.00–1.49)
Prothrombin Time: 13.8 seconds (ref 11.6–15.2)

## 2012-10-18 SURGERY — LEFT AND RIGHT HEART CATHETERIZATION WITH CORONARY ANGIOGRAM
Anesthesia: LOCAL

## 2012-10-18 MED ORDER — HEPARIN (PORCINE) IN NACL 2-0.9 UNIT/ML-% IJ SOLN
INTRAMUSCULAR | Status: AC
  Filled 2012-10-18: qty 1000

## 2012-10-18 MED ORDER — OXYCODONE-ACETAMINOPHEN 5-325 MG PO TABS: 1.0000 | ORAL_TABLET | Freq: Once | ORAL | Status: AC

## 2012-10-18 MED ORDER — SODIUM CHLORIDE 0.9 % IJ SOLN: 3.0000 mL | INTRAMUSCULAR | Status: DC | PRN

## 2012-10-18 MED ORDER — NITROGLYCERIN 0.2 MG/ML ON CALL CATH LAB
INTRAVENOUS | Status: AC
  Filled 2012-10-18: qty 1

## 2012-10-18 MED ORDER — SODIUM CHLORIDE 0.9 % IV SOLN: INTRAVENOUS | Status: DC

## 2012-10-18 MED ORDER — LIDOCAINE HCL (PF) 1 % IJ SOLN
INTRAMUSCULAR | Status: AC
  Filled 2012-10-18: qty 30

## 2012-10-18 MED ORDER — SODIUM CHLORIDE 0.9 % IV SOLN
250.0000 mL | INTRAVENOUS | Status: DC | PRN
Start: 2012-10-18 — End: 2012-10-18

## 2012-10-18 MED ORDER — MIDAZOLAM HCL 2 MG/2ML IJ SOLN
INTRAMUSCULAR | Status: AC
  Filled 2012-10-18: qty 2

## 2012-10-18 MED ORDER — ASPIRIN 81 MG PO CHEW
324.0000 mg | CHEWABLE_TABLET | ORAL | Status: DC
  Filled 2012-10-18: qty 4

## 2012-10-18 MED ORDER — OXYCODONE-ACETAMINOPHEN 5-325 MG PO TABS
ORAL_TABLET | ORAL | Status: AC
  Administered 2012-10-18: 2 via ORAL
  Filled 2012-10-18: qty 2

## 2012-10-18 MED ORDER — LABETALOL HCL 5 MG/ML IV SOLN: 10.0000 mg | INTRAVENOUS | Status: DC | PRN

## 2012-10-18 MED ORDER — FENTANYL CITRATE 0.05 MG/ML IJ SOLN
INTRAMUSCULAR | Status: AC
  Filled 2012-10-18: qty 2

## 2012-10-18 MED ORDER — SODIUM CHLORIDE 0.9 % IJ SOLN: 3.0000 mL | Freq: Two times a day (BID) | INTRAMUSCULAR | Status: DC

## 2012-10-18 MED ORDER — ONDANSETRON HCL 4 MG/2ML IJ SOLN: 4.0000 mg | Freq: Four times a day (QID) | INTRAMUSCULAR | Status: DC | PRN

## 2012-10-18 NOTE — H&P (View-Only) (Signed)
HPI [name] returns today for followup. She is a very pleasant 79 year old woman, with a history of worsening shortness of breath. This is been present for over a year. She was seen in our office a week or so ago and a 2-D echo was ordered which demonstrates left ventricular dysfunction with an ejection fraction of 35%. In addition she has at least moderately severe aortic stenosis. The actual gradient was difficult to know for certain with her left ventricular dysfunction. She has dyspnea with exertion which is class III. She denies anginal symptoms. Allergies  Allergen Reactions  . Codeine Hives  . Darvocet (Propoxyphene-Acetaminophen) Hives  . Darvon Hives  . Nubain (Nalbuphine Hcl) Hives     Current Outpatient Prescriptions  Medication Sig Dispense Refill  . alendronate (FOSAMAX) 70 MG tablet Take 70 mg by mouth once a week. Take with a full glass of water on an empty stomach.       Marland Kitchen amiodarone (PACERONE) 200 MG tablet Take 100 mg by mouth daily.       Marland Kitchen BIOTIN 5000 PO Take by mouth.        . calcitRIOL (ROCALTROL) 0.25 MCG capsule Take 0.25 mcg by mouth daily.        . carvedilol (COREG) 25 MG tablet Take 25 mg by mouth 2 (two) times daily with a meal.        . cetirizine (ZYRTEC) 10 MG tablet Take 10 mg by mouth daily.        . Cholecalciferol (VITAMIN D) 1000 UNITS capsule Take 2,000 Units by mouth daily.       Marland Kitchen docusate sodium (COLACE) 50 MG capsule Stool softner as directed       . donepezil (ARICEPT) 10 MG tablet Take 10 mg by mouth at bedtime as needed.        . DULoxetine (CYMBALTA) 60 MG capsule Take 60 mg by mouth daily.      Marland Kitchen esomeprazole (NEXIUM) 40 MG capsule Take 40 mg by mouth daily before breakfast.        . HYDROcodone-acetaminophen (NORCO) 10-325 MG per tablet Take 1 tablet by mouth every 6 (six) hours as needed.        Marland Kitchen levothyroxine (SYNTHROID, LEVOTHROID) 100 MCG tablet Take 100 mcg by mouth daily.        . Linaclotide 290 MCG CAPS Take 290 mcg by mouth  daily.      . memantine (NAMENDA) 10 MG tablet Take 10 mg by mouth 2 (two) times daily.        . Multiple Vitamin (MULTIVITAMIN) tablet Take 1 tablet by mouth daily.        . nitroGLYCERIN (NITROSTAT) 0.4 MG SL tablet Place 0.4 mg under the tongue every 5 (five) minutes as needed.        . pravastatin (PRAVACHOL) 40 MG tablet Take 40 mg by mouth daily.        . pregabalin (LYRICA) 75 MG capsule Take 75 mg by mouth 2 (two) times daily.        Marland Kitchen telmisartan (MICARDIS) 40 MG tablet Take 20 mg by mouth daily.       Marland Kitchen torsemide (DEMADEX) 20 MG tablet Take 20 mg by mouth daily.      . vitamin B-12 (CYANOCOBALAMIN) 1000 MCG tablet Take 1,000 mcg by mouth 2 (two) times a week.       . warfarin (COUMADIN) 3 MG tablet As directed        No current facility-administered medications for  this visit.     Past Medical History  Diagnosis Date  . Syncope   . Symptomatic bradycardia   . Acute on chronic renal failure   . Chronic lower back pain   . Nonischemic cardiomyopathy     history  . Moderate aortic stenosis   . HTN (hypertension)   . HLD (hyperlipidemia)   . Atrial fibrillation   . CKD (chronic kidney disease)   . H/O: stroke   . Pulmonary embolism     HISTORY OF, the pt. had a recurrent bilateral pulmonary emboli in 2005, on warfarin therapy and at which time she under went implantation of IVC filter  . Anemia   . Dementia   . Gastroesophageal reflux disease     ROS:   All systems reviewed and negative except as noted in the HPI.   Past Surgical History  Procedure Laterality Date  . Cardiac defibrillator placement  06/26/10    medtronic     No family history on file.   History   Social History  . Marital Status: Divorced    Spouse Name: N/A    Number of Children: N/A  . Years of Education: N/A   Occupational History  . Not on file.   Social History Main Topics  . Smoking status: Never Smoker   . Smokeless tobacco: Not on file  . Alcohol Use: Not on file  . Drug  Use: Not on file  . Sexually Active: Not on file   Other Topics Concern  . Not on file   Social History Narrative  . No narrative on file     BP 155/75  Pulse 78  Ht 5' (1.524 m)  Wt 157 lb 12.8 oz (71.578 kg)  BMI 30.82 kg/m2  Physical Exam:  Well appearing 79 year old woman, NAD HEENT: Unremarkable Neck:  No JVD, no thyromegally Lymphatics:  No adenopathy Back:  No CVA tenderness Lungs:  Clear HEART:  Regular rate rhythm, grade 2/6 systolic murmur of aortic stenosis, no rubs, no clicks Abd:  soft, positive bowel sounds, no organomegally, no rebound, no guarding Ext:  2 plus pulses, no edema, no cyanosis, no clubbing Skin:  No rashes no nodules Neuro:  CN II through XII intact, motor grossly intact   DEVICE  Normal device function.  See PaceArt for details.   Assess/Plan:

## 2012-10-18 NOTE — Interval H&P Note (Signed)
History and Physical Interval Note:  10/18/2012 11:11 AM  [name]  has presented today for surgery, with the diagnosis of Heart failure  The various methods of treatment have been discussed with the patient and family. After consideration of risks, benefits and other options for treatment, the patient has consented to  Procedure(s): LEFT AND RIGHT HEART CATHETERIZATION WITH CORONARY ANGIOGRAM (N/A) as a surgical intervention .  The patient's history has been reviewed, patient examined, no change in status, stable for surgery.  I have reviewed the patient's chart and labs.  Questions were answered to the patient's satisfaction.    Reviewed plan for right and left heart cath with patient and family. Risks, indication, and alternatives reviewed and they agree to proceed.  Tonny Bollman

## 2012-10-18 NOTE — CV Procedure (Signed)
   Cardiac Catheterization Procedure Note  Name: [name] MRN: [id number] DOB: 04/05/1933  Procedure: Right Heart Cath, Left Heart Cath, Selective Coronary Angiography, aortic root angiography  Indication: Aortic stenosis and left ventricular dysfunction   Procedural Details: The right groin was prepped, draped, and anesthetized with 1% lidocaine. Using the modified Seldinger technique a 5 French sheath was placed in the right femoral artery and a 6 French sheath was placed in the right femoral vein. A multipurpose catheter was used for the right heart catheterization. Caution was taken when crossing the patient's IVC filter. This was crossed directly under fluoroscopic visualization. Standard protocol was followed for recording of right heart pressures and sampling of oxygen saturations. Fick cardiac output was calculated. Standard Judkins catheters were used for selective coronary angiography and left ventriculography. There were no immediate procedural complications. The patient was transferred to the post catheterization recovery area for further monitoring.  Procedural Findings: Hemodynamics RA mean of 3 RV 31/5 PA 31/10 with a mean of 19 PCWP 7, A wave 9, V wave 9 LV 188/20 AO 172/62 with a mean of 100  Oxygen saturations: PA 59 AO 94  Cardiac Output (Fick) 4  Cardiac Index (Fick) 2.37   Aortic valve hemodynamics: Peak to peak gradient 17, mean gradient 11, aortic valve area 1.2 cm   Coronary angiography: Coronary dominance: right  Left mainstem: The left main is very short. There is mild calcification. The left main arises from the left cusp it divides into the LAD and left circumflex.  Left anterior descending (LAD): The LAD is patent to the apex. The first diagonal is patent. There is minor irregularity in the LAD and diagonal but there are no significant stenoses present.  Left circumflex (LCx): The left circumflex is large in caliber. There is a tiny first OM  and large second OM that divides into 2 branch vessels. There is no significant stenosis in the left circumflex.  Right coronary artery (RCA): The right coronary artery has an inferiorly directed origin. There is calcification in the right cusp. There is minor irregularity in the proximal RCA with a widely patent lumen. There is diffuse minor irregularity throughout the course of the RCA. The PDA and PLA branches are patent.  Left ventriculography: Deferred because of chronic kidney disease. LVEF by echo is 35%.  Aortic root angiography: The aortic valve is moderately to severely calcified. There is mild aortic insufficiency. The a standing aorta does not appear to be heavily calcified.  Final Conclusions:   1. Patent coronary arteries with mild nonobstructive disease present. 2. Moderate aortic stenosis, question low gradient aortic stenosis 3. Known moderately severe left ventricular systolic dysfunction. 4. Essentially normal right heart hemodynamics  Recommendations: The patient's aortic valve is calcified and at least moderately difficult to cross with a straight wire. I am suspicious that she has low gradient severe aortic stenosis. She does not have significant coronary artery disease. Recommend dobutamine stress echocardiography to assess for contractile reserve and significance of aortic stenosis.  Tonny Bollman 10/18/2012, 12:04 PM

## 2012-10-20 ENCOUNTER — Other Ambulatory Visit: Payer: Self-pay | Admitting: *Deleted

## 2012-10-20 DIAGNOSIS — I350 Nonrheumatic aortic (valve) stenosis: Secondary | ICD-10-CM

## 2012-10-26 ENCOUNTER — Ambulatory Visit (HOSPITAL_COMMUNITY): Payer: PRIVATE HEALTH INSURANCE | Attending: Cardiology

## 2012-10-26 ENCOUNTER — Ambulatory Visit (HOSPITAL_BASED_OUTPATIENT_CLINIC_OR_DEPARTMENT_OTHER): Payer: PRIVATE HEALTH INSURANCE | Admitting: Radiology

## 2012-10-26 ENCOUNTER — Other Ambulatory Visit (HOSPITAL_COMMUNITY): Payer: Self-pay | Admitting: *Deleted

## 2012-10-26 ENCOUNTER — Encounter: Payer: Self-pay | Admitting: Internal Medicine

## 2012-10-26 DIAGNOSIS — R0602 Shortness of breath: Secondary | ICD-10-CM

## 2012-10-26 DIAGNOSIS — I5032 Chronic diastolic (congestive) heart failure: Secondary | ICD-10-CM

## 2012-10-26 DIAGNOSIS — I350 Nonrheumatic aortic (valve) stenosis: Secondary | ICD-10-CM

## 2012-10-26 DIAGNOSIS — R0609 Other forms of dyspnea: Secondary | ICD-10-CM | POA: Insufficient documentation

## 2012-10-26 DIAGNOSIS — I359 Nonrheumatic aortic valve disorder, unspecified: Secondary | ICD-10-CM

## 2012-10-26 DIAGNOSIS — I4891 Unspecified atrial fibrillation: Secondary | ICD-10-CM | POA: Insufficient documentation

## 2012-10-26 DIAGNOSIS — I509 Heart failure, unspecified: Secondary | ICD-10-CM | POA: Insufficient documentation

## 2012-10-26 DIAGNOSIS — I5043 Acute on chronic combined systolic (congestive) and diastolic (congestive) heart failure: Secondary | ICD-10-CM

## 2012-10-26 DIAGNOSIS — I495 Sick sinus syndrome: Secondary | ICD-10-CM | POA: Insufficient documentation

## 2012-10-26 DIAGNOSIS — R0989 Other specified symptoms and signs involving the circulatory and respiratory systems: Secondary | ICD-10-CM | POA: Insufficient documentation

## 2012-10-26 DIAGNOSIS — R55 Syncope and collapse: Secondary | ICD-10-CM | POA: Insufficient documentation

## 2012-10-26 MED ORDER — DOBUTAMINE INFUSION FOR EP/ECHO/NUC (1000 MCG/ML)
20.0000 ug/kg/min | INTRAVENOUS | Status: DC
  Administered 2012-10-26: 20 ug/kg/min via INTRAVENOUS

## 2012-10-26 NOTE — Progress Notes (Signed)
Dobutamine Stress Echocardiogram performed.  

## 2012-11-02 ENCOUNTER — Encounter: Payer: Self-pay | Admitting: Thoracic Surgery (Cardiothoracic Vascular Surgery)

## 2012-11-02 ENCOUNTER — Institutional Professional Consult (permissible substitution) (INDEPENDENT_AMBULATORY_CARE_PROVIDER_SITE_OTHER): Payer: PRIVATE HEALTH INSURANCE | Admitting: Thoracic Surgery (Cardiothoracic Vascular Surgery)

## 2012-11-02 VITALS — BP 168/86 | HR 83 | Resp 20 | Ht 60.0 in | Wt 155.0 lb

## 2012-11-02 DIAGNOSIS — I480 Paroxysmal atrial fibrillation: Secondary | ICD-10-CM

## 2012-11-02 DIAGNOSIS — I359 Nonrheumatic aortic valve disorder, unspecified: Secondary | ICD-10-CM

## 2012-11-02 DIAGNOSIS — I5043 Acute on chronic combined systolic (congestive) and diastolic (congestive) heart failure: Secondary | ICD-10-CM

## 2012-11-02 DIAGNOSIS — I4891 Unspecified atrial fibrillation: Secondary | ICD-10-CM

## 2012-11-02 DIAGNOSIS — I350 Nonrheumatic aortic (valve) stenosis: Secondary | ICD-10-CM

## 2012-11-02 NOTE — Progress Notes (Signed)
[address]       [name] [address]             [phone]     CARDIOTHORACIC SURGERY CONSULTATION REPORT  Referring Provider is Tonny Bollman, MD PCP is Pearson Grippe, MD  Chief Complaint  Patient presents with  . Aortic Stenosis    Surgical eval for AVR vs TAVR , Cardaic Cath 10/18/12, ECHO 10/26/12    HPI:  Patient is a 79 year old divorced white female from [place] with long-standing history of congestive heart failure, hypertension, dilated nonischemic cardiomyopathy, aortic stenosis, paroxysmal atrial fibrillation with tachybradycardia syndrome status post permanent pacemaker placement, previous stroke, arthritis, and dementia.the patient states that she has been slowly going downhill from more than 10 years. She has had 2 or 3 strokes in the distant past, most recent in 2003. Following this she developed more progressively sedentary lifestyle. 2005 she suffered a pulmonary embolus. She gradually developed worsening cognitive dysfunction with both short and long-term memory loss. This progressed to the point where she had to stop driving an automobile approximately 6 years ago when she began to have problems getting lost on a regular basis.  She began to develop worsening problems with exertional shortness of breath with acute exacerbations of chronic diastolic congestive heart failure. In March of 2012 she developed paroxysmal atrial fibrillation with tachybradycardia syndrome and syncope, further complicated by acute on chronic renal failure. She underwent placement of permanent pacemaker.  At that time echocardiogram was reported to demonstrate the presence of moderate aortic stenosis with left ventricular ejection fraction 50%.  She has been followed by Dr. Ladona Ridgel ever since.  Over the past year the patient has developed progressive symptoms of exertional shortness of breath, currently functional class III. She was seen in followup last month by Dr. Ladona Ridgel  and a transthoracic echocardiogram was performed revealing decreased left ventricular systolic function with ejection fraction 35% and at least moderate to severe aortic stenosis. Peak velocity across the valve measured 3.5 m/s corresponding to peak and mean transvalvular gradients of 49 and 29 mmHg respectively.  Left and right heart catheterization was performed by Dr. Excell Seltzer. This revealed nonobstructive coronary artery disease with at least moderate aortic stenosis. The mean gradient across the valve measured only 11 mmHg at cath with valve area estimated 1.2 cm. However, due to concerns about the possibility of low gradient severe aortic stenosis given the patient's underlying severe left ventricular dysfunction, the patient was referred for dobutamine stress echocardiogram.  This confirmed the presence of severe aortic stenosis with peak velocity across the aortic valve 4.0 m/s at peak stress corresponding to a mean transvalvular gradient of 38 mm mercury.  The patient has been referred for surgical consultation.  The patient lives a very sedentary lifestyle. She reports her primary limitation is that is severe exertional shortness of breath. She also has chronic pain related to severe degenerative arthritis in both knees as well as degenerative disc disease of the lumbosacral spine. She walks short distances using a cane. Her gait is unsteady and she is prone to falling. She is also limited by her chronic memory loss with both short and long-term memory disturbance. She has occasional tightness in her chest with exertion. She denies resting chest pain but she does occasionally get short of breath at rest. She complains of orthopnea with occasional dizzy spells without syncope. She has not had any tachypalpitations since her pacemaker was placed. She has mild lower extremity edema.  Past Medical History  Diagnosis Date  . Syncope   . Symptomatic bradycardia   . Acute on chronic renal failure   .  Chronic lower back pain   . Nonischemic cardiomyopathy     history  . HTN (hypertension)   . HLD (hyperlipidemia)   . Atrial fibrillation   . CKD (chronic kidney disease)   . H/O: stroke   . Pulmonary embolism     HISTORY OF, the pt. had a recurrent bilateral pulmonary emboli in 2005, on warfarin therapy and at which time she under went implantation of IVC filter  . Anemia   . Dementia   . Gastroesophageal reflux disease   . Aortic stenosis 10/13/2012    Low EF, low gradient with severe aortic stenosis confirmed by dobutamine stress echocardiogram     Past Surgical History  Procedure Laterality Date  . Cardiac defibrillator placement  06/26/10    medtronic    History reviewed. No pertinent family history.  History   Social History  . Marital Status: Divorced    Spouse Name: N/A    Number of Children: N/A  . Years of Education: N/A   Occupational History  . Not on file.   Social History Main Topics  . Smoking status: Never Smoker   . Smokeless tobacco: Not on file  . Alcohol Use: Not on file  . Drug Use: Not on file  . Sexually Active: Not on file   Other Topics Concern  . Not on file   Social History Narrative  . No narrative on file    Current Outpatient Prescriptions  Medication Sig Dispense Refill  . alendronate (FOSAMAX) 70 MG tablet Take 70 mg by mouth once a week. Take with a full glass of water on an empty stomach.       Marland Kitchen amiodarone (PACERONE) 200 MG tablet Take 100 mg by mouth daily.       Marland Kitchen BIOTIN 5000 PO Take 5,000 mcg by mouth daily.       . calcitRIOL (ROCALTROL) 0.25 MCG capsule Take 0.25 mcg by mouth daily.       . carvedilol (COREG) 25 MG tablet Take 25 mg by mouth 2 (two) times daily with a meal.       . cetirizine (ZYRTEC) 10 MG tablet Take 10 mg by mouth daily.        . Cholecalciferol (VITAMIN D) 1000 UNITS capsule Take 2,000 Units by mouth daily.       . colchicine 0.6 MG tablet Take 0.6 mg by mouth every other day.      . docusate sodium  (COLACE) 50 MG capsule Stool softner as directed       . donepezil (ARICEPT) 10 MG tablet Take 10 mg by mouth at bedtime as needed.       . DULoxetine (CYMBALTA) 60 MG capsule Take 60 mg by mouth daily.      Marland Kitchen enoxaparin (LOVENOX) 40 MG/0.4ML injection Inject 40 mg into the skin daily.      Marland Kitchen esomeprazole (NEXIUM) 40 MG capsule Take 40 mg by mouth daily before breakfast.        . HYDROcodone-acetaminophen (NORCO) 10-325 MG per tablet Take 1 tablet by mouth every 6 (six) hours as needed for pain.       Marland Kitchen levothyroxine (SYNTHROID, LEVOTHROID) 100 MCG tablet Take 100 mcg by mouth daily.       . Linaclotide 290 MCG CAPS Take 290 mcg by mouth daily.      Marland Kitchen  memantine (NAMENDA) 10 MG tablet Take 10 mg by mouth 2 (two) times daily.       . Multiple Vitamin (MULTIVITAMIN) tablet Take 1 tablet by mouth daily.        . nitroGLYCERIN (NITROSTAT) 0.4 MG SL tablet Place 0.4 mg under the tongue every 5 (five) minutes as needed.        . pravastatin (PRAVACHOL) 40 MG tablet Take 40 mg by mouth daily.       . pregabalin (LYRICA) 75 MG capsule Take 75 mg by mouth 2 (two) times daily.       Marland Kitchen telmisartan (MICARDIS) 40 MG tablet Take 20 mg by mouth daily.       Marland Kitchen torsemide (DEMADEX) 20 MG tablet Take 20 mg by mouth daily.      . vitamin B-12 (CYANOCOBALAMIN) 1000 MCG tablet Take 1,000 mcg by mouth 2 (two) times a week.       . warfarin (COUMADIN) 3 MG tablet Take 1.5-3 mg by mouth daily. Take 1.5 mg on Tues., and Fri.   Take 3 mg on M, W, Thurs., Sat., and Sunday.       No current facility-administered medications for this visit.   Facility-Administered Medications Ordered in Other Visits  Medication Dose Route Frequency Provider Last Rate Last Dose  . DOBUTamine (DOBUTREX) 1,000 mcg/mL in dextrose 5% 250 mL infusion  20 mcg/kg/min (Order-Specific) Intravenous Continuous Marinus Maw, MD 81.8 mL/hr at 10/26/12 1630 20 mcg/kg/min at 10/26/12 1630    Allergies  Allergen Reactions  . Codeine Hives  . Darvocet  (Propoxyphene-Acetaminophen) Hives  . Darvon Hives  . Nubain (Nalbuphine Hcl) Hives      Review of Systems:   General:  normal appetite, decreased energy, no major weight gain, no weight loss, no fever  Cardiac:  some chest pain with exertion, no chest pain at rest, + SOB with very mild exertion, + occasional resting SOB, no PND, + orthopnea, no palpitations, no arrhythmia, no recent atrial fibrillation, + LE edema, + occasional dizzy spells, no syncope since pacemaker placed  Respiratory:  + shortness of breath, + nightime home oxygen, + productive cough with phlemm, + dry cough, no bronchitis, no wheezing, no hemoptysis, no asthma, no pain with inspiration or cough, no sleep apnea, no CPAP at night  GI:   minor difficulty swallowing, moderate reflux, no frequent heartburn, no hiatal hernia, mild abdominal pain, some constipation, occasional diarrhea, no hematochezia, no hematemesis, no melena  GU:   no dysuria,  no frequency, no urinary tract infection, no hematuria, no kidney stones, + chronic kidney disease  Vascular:  no pain suggestive of claudication, + pain in feet, no leg cramps, no varicose veins, no DVT, no non-healing foot ulcer  Neuro:   + 2 strokes in remote past, most recent 2003, no TIA's, no seizures, no headaches, no temporary blindness one eye,  no slurred speech, + peripheral neuropathy, + chronic pain, mild instability of gait, + significant memory/cognitive dysfunction very forgetful, feels lost  Musculoskeletal: + arthritis especially both knees, no joint swelling, no myalgias, +considerable and often debilitating difficulty walking, very limited mobility, walks using a cane  Skin:   no rash, no itching, no skin infections, no pressure sores or ulcerations  Psych:   no anxiety, mild depression, no nervousness, no unusual recent stress  Eyes:   has blurry vision, no floaters, has had recent vision changes, + wears glasses or contacts  ENT:   mild hearing loss, edentulous, +  full set  of dentures, last saw dentist ?  Hematologic:  + easy bruising, no abnormal bleeding, ? clotting disorder, no frequent epistaxis  Endocrine:  no diabetes, does not check CBG's at home     Physical Exam:   Ht 5' (1.524 m)  SpO2 % weight 155 lb (70.3kg), height 5'0" (152cm)  General:  Elderly, mildly obese, frail-appearing  HEENT:  Unremarkable   Neck:   no JVD, no bruits, no adenopathy   Chest:   clear to auscultation, symmetrical breath sounds, no wheezes, no rhonchi   CV:   RRR, grade III/VI crescendo/decrescendo systolic murmur   Abdomen:  soft, non-tender, no masses   Extremities:  warm, well-perfused, pulses diminished, + mild LE edema  Rectal/GU  Deferred  Neuro:   Grossly non-focal and symmetrical throughout  Skin:   Clean and dry, no rashes, no breakdown   Diagnostic Tests:  Transthoracic Echocardiography  Patient: Jodi, [name] MR #: [id number] Study Date: 09/22/2012 Gender: F Age: 68 Height: 152.4cm Weight: 69.9kg BSA: 1.84m^2 Pt. Status: Room:  Armond Hang, Sharlot Gowda PERFORMING [facility], [facility] SONOGRAPHER Philomena Course, RDCS ATTENDING Edmisten, Brooke ORDERING Edmisten, Brooke REFERRING Gas, Brooke cc:  ------------------------------------------------------------ LV EF: 35%  ------------------------------------------------------------ Indications: Dyspnea 786.09.  ------------------------------------------------------------ History: PMH: Tachy-brady syndrome. IVC filter. Acquired from the patient and from the patient's chart. Syncope and dyspnea. Paroxysmal Atrial fibrillation. Non-ischemic cardiomyopathy. Congestive heart failure. Moderate aortic stenosis. Stroke. Risk factors: Hypertension. Dyslipidemia.  ------------------------------------------------------------ Study Conclusions  - Left ventricle: Hypokinesis worse in inferior wall The cavity size was normal. Wall thickness was increased in a pattern of mild LVH. The  estimated ejection fraction was 35%. Diffuse hypokinesis. Hypokinesis of the entire myocardium. Doppler parameters are consistent with abnormal left ventricular relaxation (grade 1 diastolic dysfunction). - Aortic valve: There was moderate to severe stenosis. Mild regurgitation. - Mitral valve: Mild regurgitation. - Left atrium: The atrium was mildly dilated. - Right ventricle: Systolic function was reduced. - Atrial septum: No defect or patent foramen ovale was identified. - Pericardium, extracardiac: There was a left pleural effusion. - Impressions: Degree of AS difficult to judge due to significant LV dysfunction. Dobutamine echo may be worth while to assess contractile reserve and change in AVA with inotropes Impressions:  - Degree of AS difficult to judge due to significant LV dysfunction. Dobutamine echo may be worth while to assess contractile reserve and change in AVA with inotropes  ------------------------------------------------------------ Labs, prior tests, procedures, and surgery: Permanent pacemaker system implantation.  Transthoracic echocardiography. M-mode, complete 2D, spectral Doppler, and color Doppler. Height: Height: 152.4cm. Height: 60in. Weight: Weight: 69.9kg. Weight: 153.7lb. Body mass index: BMI: 30.1kg/m^2. Body surface area: BSA: 1.73m^2. Blood pressure: 151/85. Patient status: Outpatient. Location: [facility]  ------------------------------------------------------------  ------------------------------------------------------------ Left ventricle: Hypokinesis worse in inferior wall The cavity size was normal. Wall thickness was increased in a pattern of mild LVH. The estimated ejection fraction was 35%. Diffuse hypokinesis. Regional wall motion abnormalities: Hypokinesis of the entire myocardium. Doppler parameters are consistent with abnormal left ventricular relaxation (grade 1 diastolic  dysfunction).  ------------------------------------------------------------ Aortic valve: Trileaflet; moderately calcified leaflets. Doppler: There was moderate to severe stenosis. Mild regurgitation. VTI ratio of LVOT to aortic valve: 0.18. Valve area: 0.94cm^2(VTI). Indexed valve area: 0.56cm^2/m^2 (VTI). Peak velocity ratio of LVOT to aortic valve: 0.18. Valve area: 0.96cm^2 (Vmax). Indexed valve area: 0.58cm^2/m^2 (Vmax). Mean gradient: 29mm Hg (S). Peak gradient: 49mm Hg (S).  ------------------------------------------------------------ Aorta: The aorta was normal, not dilated, and non-diseased.  ------------------------------------------------------------ Mitral valve: Mildly thickened  leaflets . Leaflet separation was normal. Doppler: Transvalvular velocity was within the normal range. There was no evidence for stenosis. Mild regurgitation.  ------------------------------------------------------------ Left atrium: The atrium was mildly dilated.  ------------------------------------------------------------ Atrial septum: No defect or patent foramen ovale was identified.  ------------------------------------------------------------ Right ventricle: The cavity size was normal. Wall thickness was normal. Pacer wire or catheter noted in right ventricle. Systolic function was reduced.  ------------------------------------------------------------ Pulmonic valve: Structurally normal valve. Cusp separation was normal. Doppler: Transvalvular velocity was within the normal range. Mild regurgitation.  ------------------------------------------------------------ Tricuspid valve: Structurally normal valve. Leaflet separation was normal. Doppler: Transvalvular velocity was within the normal range. Mild regurgitation.  ------------------------------------------------------------ Pulmonary artery: The main pulmonary artery  was normal-sized.  ------------------------------------------------------------ Right atrium: The atrium was normal in size. Pacer wire or catheter noted in right atrium.  ------------------------------------------------------------ Pericardium: The pericardium was normal in appearance.  ------------------------------------------------------------ Systemic veins: Inferior vena cava: The vessel was normal in size; the respirophasic diameter changes were in the normal range (= 50%); findings are consistent with normal central venous pressure.  ------------------------------------------------------------ Pleura: There was a left pleural effusion.  ------------------------------------------------------------ Post procedure conclusions Ascending Aorta:  - The aorta was normal, not dilated, and non-diseased.  ------------------------------------------------------------  2D measurements Normal Doppler measurements Norma Left ventricle l LVID ED, 53.5 mm 43-52 Main pulmonary artery chord, Pressure, 28 mm Hg =30 PLAX S LVID ES, 44.5 mm 23-38 Left ventricle chord, Ea, lat 5.6 cm/s ----- PLAX ann, tiss 5 FS, chord, 17 % >29 DP PLAX E/Ea, lat 10. ----- LVPW, ED 13 mm ------ ann, tiss 12 IVS/LVPW 0.94 <1.3 DP ratio, ED Ea, med 4 cm/s ----- Ventricular septum ann, tiss IVS, ED 12.2 mm ------ DP LVOT E/Ea, med 14. ----- Diam, S 26 mm ------ ann, tiss 3 Area 5.31 cm^2 ------ DP Diam 26 mm ------ LVOT Aorta Peak vel, 63. cm/s ----- Root diam, 31 mm ------ S 7 ED VTI, S 15 cm ----- Left atrium HR 66 bpm ----- AP dim 46 mm ------ Stroke vol 79. ml ----- AP dim 2.75 cm/m^2 <2.2 6 index Cardiac 5.3 L/min ----- output Cardiac 3.1 L/(min-m ----- index ^2) Stroke 47. ml/m^2 ----- index 7 Aortic valve Peak vel, 351 cm/s ----- S Mean vel, 246 cm/s ----- S VTI, S 85. cm ----- 1 Mean 29 mm Hg ----- gradient, S Peak 49 mm Hg ----- gradient, S VTI ratio 0.1 ----- LVOT/AV  8 Area, VTI 0.9 cm^2 ----- 4 Area index 0.5 cm^2/m^2 ----- (VTI) 6 Peak vel 0.1 ----- ratio, 8 LVOT/AV Area, Vmax 0.9 cm^2 ----- 6 Area index 0.5 cm^2/m^2 ----- (Vmax) 8 Regurg PHT 560 ms ----- Mitral valve Peak E vel 57. cm/s ----- 2 Peak A vel 66. cm/s ----- 5 Decelerati 194 ms 150-2 on time 30 Peak E/A 0.9 ----- ratio Tricuspid valve Regurg 238 cm/s ----- peak vel Peak RV-RA 23 mm Hg ----- gradient, S Systemic veins Estimated 5 mm Hg ----- CVP Right ventricle Pressure, 28 mm Hg <30 S Sa vel, 9.5 cm/s ----- lat ann, 5 tiss DP  ------------------------------------------------------------ Prepared and Electronically Authenticated by  Charlton Haws 2014-06-04T15:11:[id number]    Cardiac Catheterization Procedure Note   Name: [name]  MRN: [id number]  DOB: Mar 29, 1933  Procedure: Right Heart Cath, Left Heart Cath, Selective Coronary Angiography, aortic root angiography  Indication: Aortic stenosis and left ventricular dysfunction  Procedural Details: The right groin was prepped, draped, and anesthetized with 1% lidocaine. Using the modified Seldinger technique a 5 French sheath was placed in  the right femoral artery and a 6 French sheath was placed in the right femoral vein. A multipurpose catheter was used for the right heart catheterization. Caution was taken when crossing the patient's IVC filter. This was crossed directly under fluoroscopic visualization. Standard protocol was followed for recording of right heart pressures and sampling of oxygen saturations. Fick cardiac output was calculated. Standard Judkins catheters were used for selective coronary angiography and left ventriculography. There were no immediate procedural complications. The patient was transferred to the post catheterization recovery area for further monitoring.  Procedural Findings:  Hemodynamics  RA mean of 3  RV 31/5  PA 31/10 with a mean of 19  PCWP 7, A wave 9, V wave 9  LV  188/20  AO 172/62 with a mean of 100  Oxygen saturations:  PA 59  AO 94  Cardiac Output (Fick) 4  Cardiac Index (Fick) 2.37  Aortic valve hemodynamics: Peak to peak gradient 17, mean gradient 11, aortic valve area 1.2 cm    Coronary angiography:  Coronary dominance: right  Left mainstem: The left main is very short. There is mild calcification. The left main arises from the left cusp it divides into the LAD and left circumflex.  Left anterior descending (LAD): The LAD is patent to the apex. The first diagonal is patent. There is minor irregularity in the LAD and diagonal but there are no significant stenoses present.  Left circumflex (LCx): The left circumflex is large in caliber. There is a tiny first OM and large second OM that divides into 2 branch vessels. There is no significant stenosis in the left circumflex.  Right coronary artery (RCA): The right coronary artery has an inferiorly directed origin. There is calcification in the right cusp. There is minor irregularity in the proximal RCA with a widely patent lumen. There is diffuse minor irregularity throughout the course of the RCA. The PDA and PLA branches are patent.  Left ventriculography: Deferred because of chronic kidney disease. LVEF by echo is 35%.  Aortic root angiography: The aortic valve is moderately to severely calcified. There is mild aortic insufficiency. The a standing aorta does not appear to be heavily calcified.  Final Conclusions:  1. Patent coronary arteries with mild nonobstructive disease present.  2. Moderate aortic stenosis, question low gradient aortic stenosis  3. Known moderately severe left ventricular systolic dysfunction.  4. Essentially normal right heart hemodynamics  Recommendations: The patient's aortic valve is calcified and at least moderately difficult to cross with a straight wire. I am suspicious that she has low gradient severe aortic stenosis. She does not have significant coronary artery  disease. Recommend dobutamine stress echocardiography to assess for contractile reserve and significance of aortic stenosis.  Tonny Bollman  10/18/2012, 12:04 PM     Dobutamine Stress Echocardiography  Patient: Jodi, [name] MR #: [id number] Study Date: 10/26/2012 Gender: F Age: 32 Height: 152.4cm Weight: 71.4kg BSA: 1.39m^2 Pt. Status: Room:  Armond Hang, Sharlot Gowda REFERRING Lewayne Bunting ATTENDING Olga Millers PERFORMING [facility], [facility] SONOGRAPHER Junious Dresser, RDCS cc:  ------------------------------------------------------------  ------------------------------------------------------------ Indications: Aortic stenosis 424.1.  ------------------------------------------------------------ History: PMH: Tachy-brady syndrome. IVC filter. Acquired from the patient and from the patient's chart. Syncope and dyspnea. Atrial fibrillation. Non-ischemic cardiomyopathy, with congestive heart failure, with an ejection fraction of 35%by echocardiography. Borderline significant aortic stenosis.  ------------------------------------------------------------ Study Conclusions  - Aortic valve: Valve area: 0.76cm^2(VTI). Valve area: 0.83cm^2 (Vmax). - Stress ECG conclusions: The stress ECG was normal. Impressions:  - Dobutamine echocardiogram to assess  severity of aortic stenosis in patient with severe LV dysfunction. Rest images reveal EF 20-25; peak velocity of 3.4 m/s, mean gradient of 26 mmHg and AVA 0.9 cm2.  Dobutamine 5ug - LV function unchanged.  Dobutamine 10 ug - some augmentation in LV function; peak velocity 3.9 m/s, mean gradient 38 mmHg and AVA 0.7 cm2.  Dobutamine 20 ug - LV function continues with some augmentation; peak velocity 4 m/s, mean gradient 38 mmHg, AVA 0.7 cm2.  Findings suggest true, severe aortic stenosis and not pseudostenosis.  ------------------------------------------------------------ Labs, prior tests, procedures, and  surgery: Echocardiography (June 2014). The aortic valve showed moderate to severe stenosis and mild regurgitation. EF was 35%. Aortic valve: peak gradient of 49mm Hg and mean gradient of 29mm Hg.  Catheterization. Aortic valve: mean gradient of 11mm Hg and area of 1.2cm^2. Dobutamine. Stress echocardiography. 2D. Height: Height: 152.4cm. Height: 60in. Weight: Weight: 71.4kg. Weight: 157lb. Body mass index: BMI: 30.7kg/m^2. Body surface area: BSA: 1.69m^2. Patient status: Outpatient.  ------------------------------------------------------------  ------------------------------------------------------------ Aortic valve: Doppler: VTI ratio of LVOT to aortic valve: 0.17. Valve area: 0.76cm^2(VTI). Indexed valve area: 0.43cm^2/m^2 (VTI). Peak velocity ratio of LVOT to aortic valve: 0.18. Valve area: 0.83cm^2 (Vmax). Indexed valve area: 0.47cm^2/m^2 (Vmax). Mean gradient: 34mm Hg (S). Peak gradient: 58mm Hg (S).  ------------------------------------------------------------ Baseline ECG: Normal sinus rhythm, septal MI, IVCD, inferior TWI.  ------------------------------------------------------------ Stress protocol:  +-----------------------+--+-------------+---+--------+ Stage HRBP (mmHg) SatSymptoms +-----------------------+--+-------------+---+--------+ Baseline 61109/72 (84) 94%None  +-----------------------+--+-------------+---+--------+ Dobutamine 5 ug/kg/min 62123/91 (102) 92%None  +-----------------------+--+-------------+---+--------+ Dobutamine 10 ug/kg/min60142/91 (108) 92%None  +-----------------------+--+-------------+---+--------+ Dobutamine 15 ug/kg/min---------------95%-------- +-----------------------+--+-------------+---+--------+ Dobutamine 20 ug/kg/min60192/94 (127) 93%Dyspnea  +-----------------------+--+-------------+---+--------+ Immediate post stress 60210/95 (133) 94%None   +-----------------------+--+-------------+---+--------+ Recovery; 1 min 60-------------94%None  +-----------------------+--+-------------+---+--------+ Recovery; 2 min 62-------------93%None  +-----------------------+--+-------------+---+--------+ Recovery; 3 min 60213/94 (134) 92%None  +-----------------------+--+-------------+---+--------+ Recovery; 4 min 68-------------93%None  +-----------------------+--+-------------+---+--------+ Recovery; 5 min 60198/94 (129) 92%None  +-----------------------+--+-------------+---+--------+ Recovery; 6 min 60-------------93%None  +-----------------------+--+-------------+---+--------+ Recovery; 7 min 65150/106 (121)93%None  +-----------------------+--+-------------+---+--------+ Late recovery 66-------------93%None  +-----------------------+--+-------------+---+--------+  ------------------------------------------------------------ Stress results: Maximal heart rate during stress was 68bpm (53% of maximal predicted heart rate). The maximal predicted heart rate was 142bpm.The target heart rate was not achieved. The heart rate response to stress was blunted. There was a normal resting blood pressure. Abnormal blood pressure response to dobutamine. The rate-pressure product for the peak heart rate and blood pressure was Hg/min. The patient experienced no chest pain during stress.  ------------------------------------------------------------ Stress ECG: Isolated ventricular ectopy and couplets. The stress ECG was normal.  ------------------------------------------------------------ Baseline:  Low dose: Peak stress: Recovery:  ------------------------------------------------------------  2D measurements Normal Doppler measurements Norma LVOT l Diam, S 24 mm ------ LVOT Area 4.52 cm^2 ------ Peak vel, 69. cm/s ----- S 9 VTI, S 15. cm ----- 5 Aortic valve Peak vel, 382 cm/s  ----- S Mean vel, 277 cm/s ----- S VTI, S 92. cm ----- 7 Mean 34 mm Hg ----- gradient, S Peak 58 mm Hg ----- gradient, S VTI ratio 0.1 ----- LVOT/AV 7 Area, VTI 0.7 cm^2 ----- 6 Area 0.4 cm^2/m^2 ----- index 3 (VTI) Peak vel 0.1 ----- ratio, 8 LVOT/AV Area, 0.8 cm^2 ----- Vmax 3 Area 0.4 cm^2/m^2 ----- index 7 (Vmax)  ------------------------------------------------------------ Prepared and Electronically Authenticated by  Olga Millers 2014-07-08T18:[id number]       STS Risk Calculator  Procedure    AVR  Risk of Mortality   6.1% Morbidity or Mortality  33.0% Prolonged LOS   19.3% Short LOS    12.0% Permanent Stroke   3.9% Prolonged Vent Support  27.2% DSW  Infection    0.4% Renal Failure    7.4% Reoperation    12.3%    Impression:  The patient has low gradient severe aortic stenosis with global nonischemic cardiomyopathy and progressive symptoms of congestive heart failure, now functionally class III.  Predicted risks associated with conventional surgical aortic valve replacement would be at least moderately elevated because of the patient's advanced age and significant comorbid medical conditions. However, I am concerned that the patient's risks associated with surgery would be much higher than that predicted because of the patient's very limited functional status with limited mobility, previous strokes, and at least mild dementia.  I feel the patient would likely do poorly with surgery and her risk of permanent morbidity would likely be greater than 50%.  As a result I would be reluctant to consider this patient a candidate for conventional surgical aortic valve replacement.  Transcatheter aortic valve replacement might be reasonable alternative if aggressive treatment if desired. Alternatively, continued palliative medical therapy would not be unreasonable.   Plan:  The patient and her son were counseled at length regarding treatment alternatives for  management of severe symptomatic aortic stenosis. Alternative approaches such as conventional aortic valve replacement, transcatheter aortic valve replacement, and palliative medical therapy were compared and contrasted at length.  The risks associated with conventional surgical aortic valve replacement were been discussed in detail, as were expectations for post-operative convalescence. Long-term prognosis with medical therapy was discussed. This discussion was placed in the context of the patient's own specific clinical presentation and past medical history.  All of their questions been addressed.  They desire to think matters over further before making a final decision. If aggressive treatment as desired we will plan to proceed with cardiac gated CT angiogram of the heart and CT angiogram of the chest, abdomen and pelvis to establish the anatomical feasibility of transcatheter aortic valve replacement. The patient will need pulmonary function tests and a baseline physical therapy consultation.  The patient will contact us in the near future to schedule subsequent follow up visits and testing as desired.     Salvatore Decent. Cornelius Moras, MD 11/02/2012 1:41 PM

## 2012-11-03 ENCOUNTER — Encounter: Payer: Self-pay | Admitting: Internal Medicine

## 2012-11-08 ENCOUNTER — Other Ambulatory Visit: Payer: Self-pay | Admitting: *Deleted

## 2012-11-08 DIAGNOSIS — N289 Disorder of kidney and ureter, unspecified: Secondary | ICD-10-CM

## 2012-11-08 DIAGNOSIS — I359 Nonrheumatic aortic valve disorder, unspecified: Secondary | ICD-10-CM

## 2012-11-08 MED ORDER — SODIUM BICARBONATE BOLUS VIA INFUSION: INTRAVENOUS | Status: DC

## 2012-11-08 MED ORDER — SODIUM BICARBONATE 8.4 % IV SOLN: INTRAVENOUS | Status: DC

## 2012-11-10 ENCOUNTER — Other Ambulatory Visit: Payer: Self-pay | Admitting: *Deleted

## 2012-11-10 ENCOUNTER — Ambulatory Visit (HOSPITAL_COMMUNITY)
Admission: RE | Admit: 2012-11-10 | Discharge: 2012-11-10 | Disposition: A | Discharge status: Home / Self Care | Payer: PRIVATE HEALTH INSURANCE | Source: Ambulatory Visit | Attending: Thoracic Surgery (Cardiothoracic Vascular Surgery) | Admitting: Thoracic Surgery (Cardiothoracic Vascular Surgery)

## 2012-11-10 ENCOUNTER — Other Ambulatory Visit: Payer: Self-pay | Admitting: Thoracic Surgery (Cardiothoracic Vascular Surgery)

## 2012-11-10 DIAGNOSIS — K429 Umbilical hernia without obstruction or gangrene: Secondary | ICD-10-CM | POA: Insufficient documentation

## 2012-11-10 DIAGNOSIS — I359 Nonrheumatic aortic valve disorder, unspecified: Secondary | ICD-10-CM

## 2012-11-10 DIAGNOSIS — K5730 Diverticulosis of large intestine without perforation or abscess without bleeding: Secondary | ICD-10-CM | POA: Insufficient documentation

## 2012-11-10 DIAGNOSIS — I2510 Atherosclerotic heart disease of native coronary artery without angina pectoris: Secondary | ICD-10-CM | POA: Insufficient documentation

## 2012-11-10 DIAGNOSIS — N289 Disorder of kidney and ureter, unspecified: Secondary | ICD-10-CM

## 2012-11-10 DIAGNOSIS — Z01818 Encounter for other preprocedural examination: Secondary | ICD-10-CM | POA: Insufficient documentation

## 2012-11-10 MED ORDER — SODIUM BICARBONATE 8.4 % IV SOLN: INTRAVENOUS | Status: AC

## 2012-11-10 MED ORDER — SODIUM BICARBONATE BOLUS VIA INFUSION: Freq: Once | INTRAVENOUS | Status: DC

## 2012-11-10 MED ORDER — SODIUM BICARBONATE 8.4 % IV SOLN: INTRAVENOUS | Status: DC

## 2012-11-10 MED ORDER — STERILE WATER FOR INJECTION IV SOLN
Freq: Once | INTRAVENOUS | Status: AC
  Administered 2012-11-10: 15:00:00 via INTRAVENOUS
  Filled 2012-11-10: qty 850

## 2012-11-10 MED ORDER — SODIUM BICARBONATE BOLUS VIA INFUSION: INTRAVENOUS | Status: DC

## 2012-11-10 MED ORDER — IOHEXOL 350 MG/ML SOLN
100.0000 mL | Freq: Once | INTRAVENOUS | Status: AC | PRN
  Administered 2012-11-10: 100 mL via INTRAVENOUS

## 2012-11-10 MED ORDER — SODIUM BICARBONATE BOLUS VIA INFUSION
Freq: Once | INTRAVENOUS | Status: AC
  Administered 2012-11-10: 12:00:00 via INTRAVENOUS

## 2012-11-10 MED ORDER — SODIUM BICARBONATE 8.4 % IV SOLN
INTRAVENOUS | Status: AC
  Administered 2012-11-10: 15:00:00 via INTRAVENOUS

## 2012-11-10 NOTE — Progress Notes (Signed)
Bi Carb infusion complete.  IV removed.

## 2012-11-10 NOTE — Procedures (Signed)
Successful placement of a 4 Fr vascular sheath for temporary venous access for contrast administration for CT scan.  The IV is ready for immediate use.

## 2012-11-11 ENCOUNTER — Other Ambulatory Visit: Payer: Self-pay | Admitting: *Deleted

## 2012-11-11 DIAGNOSIS — I359 Nonrheumatic aortic valve disorder, unspecified: Secondary | ICD-10-CM

## 2012-11-11 MED ORDER — SODIUM BICARBONATE BOLUS VIA INFUSION: INTRAVENOUS | Status: DC

## 2012-11-11 MED ORDER — SODIUM BICARBONATE 8.4 % IV SOLN: INTRAVENOUS | Status: DC

## 2012-11-16 ENCOUNTER — Encounter: Payer: Self-pay | Admitting: Internal Medicine

## 2012-11-17 ENCOUNTER — Ambulatory Visit
Payer: PRIVATE HEALTH INSURANCE | Attending: Thoracic Surgery (Cardiothoracic Vascular Surgery) | Admitting: Physical Therapy

## 2012-11-17 DIAGNOSIS — R5383 Other fatigue: Secondary | ICD-10-CM | POA: Insufficient documentation

## 2012-11-17 DIAGNOSIS — Z01818 Encounter for other preprocedural examination: Secondary | ICD-10-CM | POA: Insufficient documentation

## 2012-11-17 DIAGNOSIS — R5381 Other malaise: Secondary | ICD-10-CM | POA: Insufficient documentation

## 2012-11-17 DIAGNOSIS — IMO0001 Reserved for inherently not codable concepts without codable children: Secondary | ICD-10-CM | POA: Insufficient documentation

## 2012-11-23 ENCOUNTER — Other Ambulatory Visit: Payer: Self-pay | Admitting: *Deleted

## 2012-11-23 DIAGNOSIS — I359 Nonrheumatic aortic valve disorder, unspecified: Secondary | ICD-10-CM

## 2012-11-23 MED ORDER — SODIUM BICARBONATE BOLUS VIA INFUSION: INTRAVENOUS | Status: DC

## 2012-11-23 MED ORDER — SODIUM BICARBONATE 8.4 % IV SOLN: INTRAVENOUS | Status: DC

## 2012-11-24 ENCOUNTER — Other Ambulatory Visit: Payer: Self-pay | Admitting: Thoracic Surgery (Cardiothoracic Vascular Surgery)

## 2012-11-24 ENCOUNTER — Ambulatory Visit (HOSPITAL_COMMUNITY)
Admission: RE | Admit: 2012-11-24 | Discharge: 2012-11-24 | Disposition: A | Discharge status: Home / Self Care | Payer: PRIVATE HEALTH INSURANCE | Source: Ambulatory Visit | Attending: Thoracic Surgery (Cardiothoracic Vascular Surgery) | Admitting: Thoracic Surgery (Cardiothoracic Vascular Surgery)

## 2012-11-24 DIAGNOSIS — I359 Nonrheumatic aortic valve disorder, unspecified: Secondary | ICD-10-CM

## 2012-11-24 DIAGNOSIS — I2510 Atherosclerotic heart disease of native coronary artery without angina pectoris: Secondary | ICD-10-CM | POA: Insufficient documentation

## 2012-11-24 DIAGNOSIS — N289 Disorder of kidney and ureter, unspecified: Secondary | ICD-10-CM

## 2012-11-24 MED ORDER — METOPROLOL TARTRATE 1 MG/ML IV SOLN
INTRAVENOUS | Status: AC
  Filled 2012-11-24: qty 15

## 2012-11-24 MED ORDER — IOHEXOL 350 MG/ML SOLN
100.0000 mL | Freq: Once | INTRAVENOUS | Status: AC | PRN
  Administered 2012-11-24: 80 mL via INTRAVENOUS

## 2012-11-24 MED ORDER — SODIUM BICARBONATE 8.4 % IV SOLN
INTRAVENOUS | Status: DC
  Filled 2012-11-24: qty 500

## 2012-11-24 MED ORDER — SODIUM BICARBONATE 8.4 % IV SOLN: INTRAVENOUS | Status: DC

## 2012-11-24 MED ORDER — SODIUM BICARBONATE BOLUS VIA INFUSION
INTRAVENOUS | Status: AC
  Administered 2012-11-24: 11:00:00 via INTRAVENOUS
  Filled 2012-11-24 (×2): qty 1

## 2012-11-24 MED ORDER — METOPROLOL TARTRATE 1 MG/ML IV SOLN
5.0000 mg | INTRAVENOUS | Status: DC | PRN
  Administered 2012-11-24 (×2): 5 mg via INTRAVENOUS
  Filled 2012-11-24: qty 5

## 2012-11-24 MED ORDER — SODIUM BICARBONATE BOLUS VIA INFUSION: INTRAVENOUS | Status: DC

## 2012-11-24 NOTE — Procedures (Signed)
Successful placement of a 4 fr vascular sheath for temporary venous access via the right basilic vein.  The IV is ready for immediate use.

## 2012-12-03 ENCOUNTER — Ambulatory Visit (HOSPITAL_COMMUNITY): Payer: PRIVATE HEALTH INSURANCE | Admitting: Thoracic Surgery (Cardiothoracic Vascular Surgery)

## 2012-12-03 ENCOUNTER — Ambulatory Visit (HOSPITAL_COMMUNITY)
Admission: RE | Admit: 2012-12-03 | Discharge: 2012-12-03 | Disposition: A | Discharge status: Home / Self Care | Payer: PRIVATE HEALTH INSURANCE | Source: Ambulatory Visit | Attending: Thoracic Surgery (Cardiothoracic Vascular Surgery) | Admitting: Thoracic Surgery (Cardiothoracic Vascular Surgery)

## 2012-12-03 ENCOUNTER — Encounter (HOSPITAL_COMMUNITY): Payer: Self-pay | Admitting: Cardiovascular Disease

## 2012-12-03 ENCOUNTER — Ambulatory Visit (HOSPITAL_COMMUNITY)
Admission: RE | Admit: 2012-12-03 | Discharge: 2012-12-03 | Disposition: A | Discharge status: Home / Self Care | Payer: PRIVATE HEALTH INSURANCE | Source: Ambulatory Visit | Attending: Cardiovascular Disease | Admitting: Cardiovascular Disease

## 2012-12-03 VITALS — BP 154/69 | HR 77 | Resp 21 | Ht <= 58 in | Wt 157.0 lb

## 2012-12-03 DIAGNOSIS — G8929 Other chronic pain: Secondary | ICD-10-CM | POA: Insufficient documentation

## 2012-12-03 DIAGNOSIS — N189 Chronic kidney disease, unspecified: Secondary | ICD-10-CM | POA: Insufficient documentation

## 2012-12-03 DIAGNOSIS — Z950 Presence of cardiac pacemaker: Secondary | ICD-10-CM | POA: Insufficient documentation

## 2012-12-03 DIAGNOSIS — M549 Dorsalgia, unspecified: Secondary | ICD-10-CM | POA: Insufficient documentation

## 2012-12-03 DIAGNOSIS — Z86711 Personal history of pulmonary embolism: Secondary | ICD-10-CM | POA: Insufficient documentation

## 2012-12-03 DIAGNOSIS — I428 Other cardiomyopathies: Secondary | ICD-10-CM | POA: Insufficient documentation

## 2012-12-03 DIAGNOSIS — K219 Gastro-esophageal reflux disease without esophagitis: Secondary | ICD-10-CM | POA: Insufficient documentation

## 2012-12-03 DIAGNOSIS — Z8673 Personal history of transient ischemic attack (TIA), and cerebral infarction without residual deficits: Secondary | ICD-10-CM | POA: Insufficient documentation

## 2012-12-03 DIAGNOSIS — I359 Nonrheumatic aortic valve disorder, unspecified: Secondary | ICD-10-CM | POA: Insufficient documentation

## 2012-12-03 DIAGNOSIS — I129 Hypertensive chronic kidney disease with stage 1 through stage 4 chronic kidney disease, or unspecified chronic kidney disease: Secondary | ICD-10-CM | POA: Insufficient documentation

## 2012-12-03 DIAGNOSIS — I2510 Atherosclerotic heart disease of native coronary artery without angina pectoris: Secondary | ICD-10-CM | POA: Insufficient documentation

## 2012-12-03 LAB — PULMONARY FUNCTION TEST

## 2012-12-03 NOTE — Progress Notes (Signed)
MULTIDISCIPLINARY HEART VALVE CLINIC NOTE  Patient ID: [name] MRN: [id number] DOB/AGE: January 12, 1933 79 y.o.  Primary Care Physician:KIM, Fayrene Fearing, MD Primary Cardiologist: Dr Ladona Ridgel  HPI: 79 year old woman presenting to the multidisciplinary heart valve clinic for further evaluation of her severe low gradient aortic stenosis. The patient has undergone extensive evaluation with studies as outlined below. A 2-D echocardiogram has demonstrated findings consistent with moderate to severe aortic stenosis in the setting of a severe cardiomyopathy with left ventricular ejection fraction of 35%. A dobutamine stress echocardiogram was performed and demonstrated findings consistent with severe aortic stenosis with a valve area of 0.7 cm. Cardiac catheterization demonstrated minor nonobstructive coronary artery disease. The patient underwent a cardiac gated CT which indicated a type I bicuspid aortic valve with fusion of the left and right coronary cusps. The morphology of the aortic valve annulus had a normal appearance. Her pelvic anatomy based on findings of CT angiography demonstrated suitable access for transcatheter aortic valve replacement.  The patient has developed progressive and severe shortness of breath with minimal activity. She has mild shortness of breath at rest. She's also had chest discomfort with low-level exertion. This led to her evaluation as outlined above. She denies orthopnea, PND, or leg swelling. She's had no lightheadedness or syncope. She underwent physical therapy assessment on July 31 demonstrating moderate frailty, marked limitation with inability to complete any of the functional/balance testing. She had marked shortness of breath with just 10 feet of walking. She presents today for further discussion of her treatment options for her severe aortic stenosis.  Past Medical History  Diagnosis Date  . Syncope   . Symptomatic bradycardia   . Acute on chronic renal failure   .  Chronic lower back pain   . Nonischemic cardiomyopathy   . HTN (hypertension)   . HLD (hyperlipidemia)   . Atrial fibrillation     tachy-brady syndrome with <1% recurrent PAF since pacemaker placement  . CKD (chronic kidney disease)   . H/O: stroke   . Pulmonary embolism     HISTORY OF, the pt. had a recurrent bilateral pulmonary emboli in 2005, on warfarin therapy and at which time she under went implantation of IVC filter  . Anemia   . Dementia   . Gastroesophageal reflux disease   . Aortic stenosis 10/13/2012    Low EF, low gradient with severe aortic stenosis confirmed by dobutamine stress echocardiogram     Past Surgical History  Procedure Laterality Date  . Cardiac defibrillator placement  06/26/10    medtronic    No family history on file.  History   Social History  . Marital Status: Divorced    Spouse Name: N/A    Number of Children: N/A  . Years of Education: N/A   Occupational History  . Not on file.   Social History Main Topics  . Smoking status: Never Smoker   . Smokeless tobacco: Not on file  . Alcohol Use: Not on file  . Drug Use: Not on file  . Sexual Activity: Not on file   Other Topics Concern  . Not on file   Social History Narrative  . No narrative on file     Allergies  Allergen Reactions  . Codeine Hives  . Darvocet [Propoxyphene-Acetaminophen] Hives  . Darvon Hives  . Nubain [Nalbuphine Hcl] Hives    ROS:  General: no fevers/chills/night sweats Eyes: no blurry vision, diplopia, or amaurosis ENT: no sore throat . Positive for hearing loss Resp: no cough, wheezing, or  hemoptysis CV: no edema or palpitations GI: no abdominal pain, nausea, vomiting, diarrhea, or constipation. Positive for acid reflux GU: no dysuria or hematuria. Positive for frequent urination and difficulty emptying. Skin: no rash Neuro: no headache, numbness, tingling, or weakness of extremities Musculoskeletal: Positive for leg pain with ambulation. The patient  ambulates with a cane. Right knee and right hip pain. Heme: no bleeding, DVT. Positive for easy bruising. Positive for IVC filter Endo: no polydipsia or polyuria  BP 154/69  Pulse 77  Resp 21  Ht 4\' 10"  (1.473 m)  Wt 157 lb (71.215 kg)  BMI 32.82 kg/m2  SpO2 99%  PHYSICAL EXAM: Pt is alert and oriented, pleasant elderly woman in no distress HEENT: normal Neck: JVP normal. Carotid upstrokes delayed without bruits No thyromegaly. Lungs: equal expansion, clear bilaterally CV: Apex is discrete and nondisplaced, RRR with a grade 3/6 harsh crescendo decrescendo murmur with absent aortic closure sound  Abd: soft, NT, +BS, no bruit, no hepatosplenomegaly Back: no CVA tenderness Ext: no C/C/E        Femoral pulses 2+= without bruits        DP/PT pulses intact and = Skin: warm and dry without rash Neuro: CNII-XII intact             Strength intact = bilaterally  2D ECHO:  Study Conclusions  - Left ventricle: Hypokinesis worse in inferior wall The cavity size was normal. Wall thickness was increased in a pattern of mild LVH. The estimated ejection fraction was 35%. Diffuse hypokinesis. Hypokinesis of the entire myocardium. Doppler parameters are consistent with abnormal left ventricular relaxation (grade 1 diastolic dysfunction). - Aortic valve: There was moderate to severe stenosis. Mild regurgitation. - Mitral valve: Mild regurgitation. - Left atrium: The atrium was mildly dilated. - Right ventricle: Systolic function was reduced. - Atrial septum: No defect or patent foramen ovale was identified. - Pericardium, extracardiac: There was a left pleural effusion. - Impressions: Degree of AS difficult to judge due to significant LV dysfunction. Dobutamine echo may be worth while to assess contractile reserve and change in AVA with inotropes Impressions:  - Degree of AS difficult to judge due to significant LV dysfunction. Dobutamine echo may be worth while to  assess contractile reserve and change in AVA with inotropes   Dobutamine echocardiogram 10/26/2012: Study Conclusions  - Aortic valve: Valve area: 0.76cm^2(VTI). Valve area: 0.83cm^2 (Vmax). - Stress ECG conclusions: The stress ECG was normal. Impressions:  - Dobutamine echocardiogram to assess severity of aortic stenosis in patient with severe LV dysfunction. Rest images reveal EF 20-25; peak velocity of 3.4 m/s, mean gradient of 26 mmHg and AVA 0.9 cm2.  Dobutamine 5ug - LV function unchanged.  Dobutamine 10 ug - some augmentation in LV function; peak velocity 3.9 m/s, mean gradient 38 mmHg and AVA 0.7 cm2.  Dobutamine 20 ug - LV function continues with some augmentation; peak velocity 4 m/s, mean gradient 38 mmHg, AVA 0.7 cm2.  Findings suggest true, severe aortic stenosis and not pseudostenosis.  CARDIAC CATH:  Procedure: Right Heart Cath, Left Heart Cath, Selective Coronary Angiography, aortic root angiography  Indication: Aortic stenosis and left ventricular dysfunction  Procedural Details: The right groin was prepped, draped, and anesthetized with 1% lidocaine. Using the modified Seldinger technique a 5 French sheath was placed in the right femoral artery and a 6 French sheath was placed in the right femoral vein. A multipurpose catheter was used for the right heart catheterization. Caution was taken when crossing the patient's IVC  filter. This was crossed directly under fluoroscopic visualization. Standard protocol was followed for recording of right heart pressures and sampling of oxygen saturations. Fick cardiac output was calculated. Standard Judkins catheters were used for selective coronary angiography and left ventriculography. There were no immediate procedural complications. The patient was transferred to the post catheterization recovery area for further monitoring.  Procedural Findings:  Hemodynamics  RA mean of 3  RV 31/5  PA 31/10 with a mean of 19  PCWP 7, A  wave 9, V wave 9  LV 188/20  AO 172/62 with a mean of 100  Oxygen saturations:  PA 59  AO 94  Cardiac Output (Fick) 4  Cardiac Index (Fick) 2.37  Aortic valve hemodynamics: Peak to peak gradient 17, mean gradient 11, aortic valve area 1.2 cm   Coronary angiography:  Coronary dominance: right  Left mainstem: The left main is very short. There is mild calcification. The left main arises from the left cusp it divides into the LAD and left circumflex.  Left anterior descending (LAD): The LAD is patent to the apex. The first diagonal is patent. There is minor irregularity in the LAD and diagonal but there are no significant stenoses present.  Left circumflex (LCx): The left circumflex is large in caliber. There is a tiny first OM and large second OM that divides into 2 branch vessels. There is no significant stenosis in the left circumflex.  Right coronary artery (RCA): The right coronary artery has an inferiorly directed origin. There is calcification in the right cusp. There is minor irregularity in the proximal RCA with a widely patent lumen. There is diffuse minor irregularity throughout the course of the RCA. The PDA and PLA branches are patent.  Left ventriculography: Deferred because of chronic kidney disease. LVEF by echo is 35%.  Aortic root angiography: The aortic valve is moderately to severely calcified. There is mild aortic insufficiency. The a standing aorta does not appear to be heavily calcified.  Final Conclusions:  1. Patent coronary arteries with mild nonobstructive disease present.  2. Moderate aortic stenosis, question low gradient aortic stenosis  3. Known moderately severe left ventricular systolic dysfunction.  4. Essentially normal right heart hemodynamics  Recommendations: The patient's aortic valve is calcified and at least moderately difficult to cross with a straight wire. I am suspicious that she has low gradient severe aortic stenosis. She does not have significant  coronary artery disease. Recommend dobutamine stress echocardiography to assess for contractile reserve and significance of aortic stenosis.  Tonny Bollman   Cardiac gated CTA: AORTIC ROOT FINDINGS AND MEASUREMENTS PERTINENT TO POTENTIAL TAVR:  Aortic Valve Description: The aortic valve is markedly thickened  and heavily calcified. This valve appears to represent a type 1  bicuspid aortic valve (separate noncoronary cusp, and fused left  and right cusp moieties with an intervening median raphe). During  systole, there is limited opening of the valve, with an estimated  cross-sectional area of the approximately 0.85 cm2 by planimetry.  During diastole there is a tiny central region of malcoaptation  (approximately 2 mm squared), suggesting trace aortic  insufficiency.  Annulus:  Planimetry - 4.96 cm2 (calculated diameter 25.1 mm)  Long & Short Axis - 27.2 x 25.0 mm (calculated diameter 26.1  mm)  Circumference - 82.0 mm (calculated diameter 26.1 mm)  Sinuses of Valsalva:  R SOV - width 32.2 mm  height 25.0 mm  L SOV - width 33.4 mm  height 16.2 mm  [place] SOV - width 33.5 mm  height 25.2 mm  Coronary Artery [place]:  L main - 12.4 mm, 14.3 mm and 15.8 mm from the annulus to the  inferior,  mid and superior aspect of the left main coronary  artery ostium.  RCA - 14.6 mm from the annulus  Ascending Aorta:  35.0 x 37.7 mm in diameter 4 cm above the annulus.  OTHER AORTA AND PULMONARY MEASUREMENTS:  Descending aorta: (< 40 mm): 26 mm  Main pulmonary artery: (< 30 mm): 25 mm  OTHER FINDINGS:  Patchy linear opacities throughout the lung parenchyma are again  noted, most compatible with areas of mild chronic scarring. No  acute consolidative airspace disease or suspicious appearing  pulmonary nodules are noted within the visualized thorax. No  pleural effusions. Visualized portions of the upper abdomen are  unremarkable. Pacemaker leads terminate in the right atrial  appendage and  right ventricular apex.  IMPRESSION:  1. Findings and measurements pertinent to potential TAVR  procedure, as above. Notably, the aortic valve appears to be a  type 1 bicuspid aortic valve, with fusion of the left and right  cusp moieties with an intervening median raphe.  2. The aortic valve is severe sclerotic, with estimated aortic  valve area approximately 0.85 cm2 by planimetry. There is also  evidence to suggest trace aortic insufficiency.  3. Calculated annular diameter of 25-26 mm, as above.  4. Limited evaluation of the coronary arteries secondary to the  rapid and irregular rhythm at the time of image acquisition. With  these limitations in mind, there appears to be only mild  nonobstructive multivessel coronary artery disease.   CTA Chest/Abdomen/Pelvis: Findings:  Mediastinum: Heart size is enlarged. There is no significant  pericardial fluid, thickening or pericardial calcification. Right-  sided pacemaker device in place with lead tips terminating in the  right atrial appendage and right ventricular apex. There is  atherosclerosis of the thoracic aorta, the great vessels of the  mediastinum and the coronary arteries, including calcified  atherosclerotic plaque in the left anterior descending, left  circumflex and right coronary arteries. No pathologically enlarged  mediastinal or hilar lymph nodes. Esophagus is unremarkable in  appearance. Thickening and calcification of the aortic valve.  Subclavian arteries measuring 9 mm and 7 mm in diameter on the  right than left respectively. The right subclavian and innominate  artery is moderately tortuous, where as there is only mild  tortuosity of the left subclavian artery.  Lungs/Pleura: Assessment of the lung parenchyma is limited by  considerable patient respiratory motion. With these limitations in  mind, there is no definite suspicious appearing pulmonary nodule or  mass. Patchy areas of ground-glass attenuation and  mild subpleural  reticulation are noted throughout the lung bases bilaterally.  Subsegmental atelectasis or scarring in the medial segment of the  right middle lobe and medial aspect of the right upper lobe. No  acute consolidative airspace disease. No pleural effusions.  Musculoskeletal: There are no aggressive appearing lytic or blastic  lesions noted in the visualized portions of the skeleton.  Review of the MIP images confirms the above findings.  IMPRESSION:  1. Extensively calcified and thickened aortic valve compatible  with the patient's reported history of aortic stenosis.  Atherosclerosis, including three-vessel coronary artery disease.  Please note that although the presence of coronary artery calcium  documents the presence of coronary artery disease, the severity of  this disease and any potential stenosis cannot be assessed on this  non-gated CT examination. Assessment for potential risk factor  modification, dietary therapy or pharmacologic therapy may be  warranted, if clinically indicated.  3. The subclavian arteries appear suitable for potential upper  extremity access for low-profile TAVR devices if clinically  desired.  4. Limited evaluation of the lung parenchyma demonstrates patchy  areas of probable scarring lungs bilaterally, as above.  CTA ABDOMEN AND PELVIS  VASCULAR MEASUREMENTS PERTINENT TO TAVR:  AORTA:  Minimal Aortic Diameter - 14 x 9 mm  Severity of Aortic Calcification - severe  RIGHT PELVIS:  Right Common Iliac Artery -  Minimal Diameter - 6.3 x 10.2 mm  Tortuosity - moderate  Calcification - moderate  Right External Iliac Artery -  Minimal Diameter - 6.6 x 7.1 mm  Tortuosity - mild  Calcification - minimal  Right Common Femoral Artery -  Minimal Diameter - 6.4 x 7.0 mm  Tortuosity - mild  Calcification - mild  LEFT PELVIS:  Left Common Iliac Artery -  Minimal Diameter - 6.9 x 6.8 mm  Tortuosity - mild  Calcification - moderate  Left  External Iliac Artery -  Minimal Diameter - 6.1 x 6.8 mm  Tortuosity - mild  Calcification - minimal  Left Common Femoral Artery -  Minimal Diameter - 6.5 x 7.2 mm  Tortuosity - mild  Calcification - mild  Other Findings:  Abdomen/Pelvis: The appearance of the liver, gallbladder,  pancreas, spleen and bilateral adrenal glands is unremarkable.  There is significant parenchymal atrophy throughout the kidneys  bilaterally. Multiple low attenuation renal lesions are noted.  The smallest of these are too small to definitively characterize,  while the larger lesions are compatible with simple cysts, with the  largest cyst measuring 3 cm in diameter in the upper pole of the  right kidney posteriorly. An IVC filter is in place with tip  terminating immediately below the level of the right renal vein.  Numerous colonic diverticula are noted without surrounding  inflammatory changes to suggest an acute diverticulitis at this  time. Tiny umbilical hernia containing only omental fat  incidentally noted. No significant volume of ascites. No  pneumoperitoneum. No pathologic distension of small bowel. No  definite pathologic lymphadenopathy identified within the abdomen  or pelvis on today's examination. Status post hysterectomy.  Ovaries are atrophic. Urinary bladder is unremarkable in  appearance.  Musculoskeletal: There are no aggressive appearing lytic or blastic  lesions noted in the visualized portions of the skeleton.  Review of the MIP images confirms the above findings.  IMPRESSION:  1. Vascular findings and measurements pertinent to potential TAVR  procedure, as detailed above. The patient does appear to have  suitable pelvic arterial access for low-profile devices.  2. Additional incidental findings, as above.   6 MINUTE WALK: The patient walked 10 feet and developed severe shortness of breath and mild chest pain.  Physical therapy assessment: The patient has a frailty index of 6  which indicates moderately frail.  The patient could not complete her time to get up and go, 32nd chair stand test, or 4 stage balance test. All of this indicates high fall risk.  Pulmonary Function Tests  Baseline  FVC 2.00 L (101% predicted)  FEV1 1.46 L (100% predicted)  FEF25-75 0.89 L (77% predicted)  RV 2.17 L (105% predicted)  DLCO 16.18% predicted      STS RISK CALCULATOR:  STS Risk Calculator  Procedure AVR  Risk of Mortality 6.1%  Morbidity or Mortality 33.0%  Prolonged LOS 19.3%  Short LOS 12.0%  Permanent Stroke 3.9%  Prolonged Vent  Support 27.2%  DSW Infection 0.4%  Renal Failure 7.4%  Reoperation 12.3%  ASSESSMENT AND PLAN:  79 year old woman with severe symptomatic aortic stenosis, low gradient in the setting of nonischemic cardiomyopathy. Dobutamine echocardiography confirmed severe aortic stenosis. Morphologically the patient's valve is heavily calcified with restricted opening as visualized on echocardiography and plain fluoroscopy. Her symptoms have clearly progressed over recent months and she has developed [place] Heart Association class IV symptoms. We discussed treatment options for her severe aortic stenosis at length today. I agree that she is at high risk of complications with open surgical aortic valve replacement. She has extremely limited functional capacity as indicated with an objective physical therapy assessment. She also has multiple medical comorbidities including history of stroke, recurrent pulmonary emboli with IVC filter, and heart failure. Alternative treatment options include palliative medical therapy versus transcatheter aortic valve replacement. We discussed TAVR as a treatment alternative. The patient and her son understand the lack of randomized controlled data or FDA approval for TAVR in patients with a bicuspid aortic valve. However, her valve morphology does have some favorable characteristics with a relatively normal and noncalcified  aortic valve annulus. She would like to seek treatment and I think TAVR is a reasonable treatment option for her since she is not a candidate for open cardiac surgery. With her progressive symptoms, I would predict continued decline without any intervention. They understand the risk of perivalvular aortic insufficiency may be increased. They also understand that there is an increased risk of hemodynamic instability and other serious procedural complications because of her left ventricular dysfunction. Other specific risks were discussed, and these include stroke, myocardial infarction, cardiac perforation, valve embolization, arrhythmia, vascular injury, infection, and bleeding. In the case of catastrophic complication, I suspect she would be a candidate for short-term cardiopulmonary bypass but I do not think she would be a candidate for an open sternotomy. I would defer the final decision on this to Dr. Cornelius Moras who she sees next week. Pending their discussion with Dr. Cornelius Moras, the patient and her son are interested in pursuing treatment.  Tonny Bollman 12/03/2012 3:25 PM

## 2012-12-03 NOTE — Progress Notes (Signed)
Pulmonary Function Tests  Baseline        FVC  2.00 L  (101% predicted)  FEV1  1.46 L  (100% predicted)  FEF25-75 0.89 L  (77% predicted)   RV  2.17 L  (105% predicted) DLCO  16.18% predicted

## 2012-12-06 ENCOUNTER — Ambulatory Visit (INDEPENDENT_AMBULATORY_CARE_PROVIDER_SITE_OTHER): Payer: PRIVATE HEALTH INSURANCE | Admitting: Thoracic Surgery (Cardiothoracic Vascular Surgery)

## 2012-12-06 ENCOUNTER — Encounter: Payer: Self-pay | Admitting: Thoracic Surgery (Cardiothoracic Vascular Surgery)

## 2012-12-06 VITALS — BP 150/82 | HR 89 | Resp 20 | Ht <= 58 in | Wt 157.0 lb

## 2012-12-06 DIAGNOSIS — I350 Nonrheumatic aortic (valve) stenosis: Secondary | ICD-10-CM

## 2012-12-06 DIAGNOSIS — I359 Nonrheumatic aortic valve disorder, unspecified: Secondary | ICD-10-CM

## 2012-12-06 NOTE — Progress Notes (Signed)
[address]       [place] [address]             [phone]     CARDIOTHORACIC SURGERY OFFICE NOTE  Referring Provider is Tonny Bollman, MD PCP is Pearson Grippe, MD   HPI:  Patient returns for followup of low gradient severe symptomatic aortic stenosis with global nonischemic cardiomyopathy. She was originally seen in consultation on 11/02/2012. Since then the patient and her family have decided to proceed with further diagnostic workup to consider possible transcatheter aortic valve replacement.  She underwent CT angiogram of the chest abdomen and pelvis with cardiac gated CT angiogram of the heart. She also underwent a formal physical therapy evaluation and pulmonary function tests.  She has been seen in followup by Dr. Excell Seltzer last week and returns to discuss matters further in the office today.   Current Outpatient Prescriptions  Medication Sig Dispense Refill  . alendronate (FOSAMAX) 70 MG tablet Take 70 mg by mouth once a week. Take with a full glass of water on an empty stomach.       Marland Kitchen amiodarone (PACERONE) 200 MG tablet Take 100 mg by mouth daily.       Marland Kitchen BIOTIN 5000 PO Take 5,000 mcg by mouth daily.       . calcitRIOL (ROCALTROL) 0.25 MCG capsule Take 0.25 mcg by mouth daily.       . carvedilol (COREG) 25 MG tablet Take 25 mg by mouth 2 (two) times daily with a meal.       . cetirizine (ZYRTEC) 10 MG tablet Take 10 mg by mouth daily.        . Cholecalciferol (VITAMIN D) 1000 UNITS capsule Take 2,000 Units by mouth daily.       . colchicine 0.6 MG tablet Take 0.6 mg by mouth every other day.      . docusate sodium (COLACE) 50 MG capsule Stool softner as directed       . donepezil (ARICEPT) 10 MG tablet Take 10 mg by mouth at bedtime as needed.       . DULoxetine (CYMBALTA) 60 MG capsule Take 60 mg by mouth daily.      Marland Kitchen esomeprazole (NEXIUM) 40 MG capsule Take 40 mg by mouth daily before breakfast.        . hydrALAZINE (APRESOLINE) 10 MG tablet         . HYDROcodone-acetaminophen (NORCO) 10-325 MG per tablet Take 1 tablet by mouth every 6 (six) hours as needed for pain.       Marland Kitchen levothyroxine (SYNTHROID, LEVOTHROID) 100 MCG tablet Take 100 mcg by mouth daily.       . Linaclotide 290 MCG CAPS Take 290 mcg by mouth daily.      . memantine (NAMENDA) 10 MG tablet Take 10 mg by mouth 2 (two) times daily.       . Multiple Vitamin (MULTIVITAMIN) tablet Take 1 tablet by mouth daily.        . nitroGLYCERIN (NITROSTAT) 0.4 MG SL tablet Place 0.4 mg under the tongue every 5 (five) minutes as needed.        . pravastatin (PRAVACHOL) 40 MG tablet Take 40 mg by mouth daily.       . pregabalin (LYRICA) 75 MG capsule Take 75 mg by mouth 2 (two) times daily.       Marland Kitchen telmisartan (MICARDIS) 40 MG tablet Take 20 mg by mouth daily.       Marland Kitchen  torsemide (DEMADEX) 20 MG tablet Take 20 mg by mouth daily.      Marland Kitchen ULORIC 40 MG tablet       . vitamin B-12 (CYANOCOBALAMIN) 1000 MCG tablet Take 1,000 mcg by mouth 2 (two) times a week.       . warfarin (COUMADIN) 3 MG tablet Take 1.5-3 mg by mouth daily. Take 1.5 mg on Tues., and Fri.   Take 3 mg on M, W, Thurs., Sat., and Sunday.       No current facility-administered medications for this visit.   Facility-Administered Medications Ordered in Other Visits  Medication Dose Route Frequency Provider Last Rate Last Dose  . DOBUTamine (DOBUTREX) 1,000 mcg/mL in dextrose 5% 250 mL infusion  20 mcg/kg/min (Order-Specific) Intravenous Continuous Marinus Maw, MD 81.8 mL/hr at 10/26/12 1630 20 mcg/kg/min at 10/26/12 1630  . sodium bicarbonate first hour bolus via infusion   Intravenous Once Massie Maroon, MD          Physical Exam:   BP 150/82  Pulse 89  Resp 20  Ht 4\' 10"  (1.473 m)  Wt 157 lb (71.215 kg)  BMI 32.82 kg/m2  SpO2 98%  General:  Elderly and frail  Chest:   Bibasilar respiratory crackles with symmetrical breath sounds, no wheezes  CV:   Irregular rate and rhythm with systolic  murmur  Incisions:  n/a  Abdomen:  Soft and nontender  Extremities:  Warm and well-perfused with mild lower extremity edema  Diagnostic Tests:  CT ANGIOGRAPHY OF THE HEART, CORONARY ARTERY, STRUCTURE, AND  MORPHOLOGY  COMPARISON: Chest CT 11/10/2012.  CONTRAST: 80mL OMNIPAQUE IOHEXOL 350 MG/ML SOLN  TECHNIQUE: CT angiography of the coronary vessels was performed on  a 256 channel system using prospective ECG gating. A scout and ECG-  gated noncontrast exam (for calcium scoring) were performed.  Appropriate delay was determined by bolus tracking after injection  of iodinated contrast, and an ECG-gated coronary CTA was performed  with sub-mm slice collimation during throughout the cardiac cycle.  Imaging post processing was performed on an independent workstation  creating multiplanar and 3-D images, allowing for quantitative  analysis of the heart and coronary arteries. Note that this exam  targets the heart and the chest was not imaged in its entirety.  PREMEDICATION:  Lopressor 10 mg, IV  FINDINGS:  Technical quality: Diagnostic for evaluation of the aortic root.  Nondiagnostic for evaluation of the coronary arteries secondary to  the patient's rapid and irregular rhythm at the time of image  acquisition.  Heart rate: 70-75 with ectopy.  CORONARY ARTERIES:  Evaluation of the coronary arteries is suboptimal related to the  patient's rapid and irregular rhythm at the time of image  acquisition. There is mild left main and three-vessel coronary  artery disease, however, this appears to be nonobstructive on  today's limited images.  AORTIC ROOT FINDINGS AND MEASUREMENTS PERTINENT TO POTENTIAL TAVR:  Aortic Valve Description: The aortic valve is markedly thickened  and heavily calcified. This valve appears to represent a type 1  bicuspid aortic valve (separate noncoronary cusp, and fused left  and right cusp moieties with an intervening median raphe). During  systole, there is  limited opening of the valve, with an estimated  cross-sectional area of the approximately 0.85 cm2 by planimetry.  During diastole there is a tiny central region of malcoaptation  (approximately 2 mm squared), suggesting trace aortic  insufficiency.  Annulus:  Planimetry - 4.96 cm2 (calculated diameter 25.1 mm)  Long & Short Axis -  27.2 x 25.0 mm (calculated diameter 26.1  mm)  Circumference - 82.0 mm (calculated diameter 26.1 mm)  Sinuses of Valsalva:  R SOV - width 32.2 mm  height 25.0 mm  L SOV - width 33.4 mm  height 16.2 mm  [place] SOV - width 33.5 mm  height 25.2 mm  Coronary Artery [place]:  L main - 12.4 mm, 14.3 mm and 15.8 mm from the annulus to the  inferior,  mid and superior aspect of the left main coronary  artery ostium.  RCA - 14.6 mm from the annulus  Ascending Aorta:  35.0 x 37.7 mm in diameter 4 cm above the annulus.  OTHER AORTA AND PULMONARY MEASUREMENTS:  Descending aorta: (< 40 mm): 26 mm  Main pulmonary artery: (< 30 mm): 25 mm  OTHER FINDINGS:  Patchy linear opacities throughout the lung parenchyma are again  noted, most compatible with areas of mild chronic scarring. No  acute consolidative airspace disease or suspicious appearing  pulmonary nodules are noted within the visualized thorax. No  pleural effusions. Visualized portions of the upper abdomen are  unremarkable. Pacemaker leads terminate in the right atrial  appendage and right ventricular apex.  IMPRESSION:  1. Findings and measurements pertinent to potential TAVR  procedure, as above. Notably, the aortic valve appears to be a  type 1 bicuspid aortic valve, with fusion of the left and right  cusp moieties with an intervening median raphe.  2. The aortic valve is severe sclerotic, with estimated aortic  valve area approximately 0.85 cm2 by planimetry. There is also  evidence to suggest trace aortic insufficiency.  3. Calculated annular diameter of 25-26 mm, as above.  4. Limited evaluation of  the coronary arteries secondary to the  rapid and irregular rhythm at the time of image acquisition. With  these limitations in mind, there appears to be only mild  nonobstructive multivessel coronary artery disease.  Original Report Authenticated By: Trudie Reed, M.D.     CT ANGIOGRAPHY CHEST, ABDOMEN AND PELVIS  Technique: Multidetector CT imaging through the chest, abdomen and  pelvis was performed using the standard protocol during bolus  administration of intravenous contrast. Multiplanar reconstructed  images including MIPs were obtained and reviewed to evaluate the  vascular anatomy.  Contrast: OMNIPAQUE IOHEXOL 350 MG/ML SOLN  Comparison: Chest CT 10/26/2003.  CTA CHEST  Findings:  Mediastinum: Heart size is enlarged. There is no significant  pericardial fluid, thickening or pericardial calcification. Right-  sided pacemaker device in place with lead tips terminating in the  right atrial appendage and right ventricular apex. There is  atherosclerosis of the thoracic aorta, the great vessels of the  mediastinum and the coronary arteries, including calcified  atherosclerotic plaque in the left anterior descending, left  circumflex and right coronary arteries. No pathologically enlarged  mediastinal or hilar lymph nodes. Esophagus is unremarkable in  appearance. Thickening and calcification of the aortic valve.  Subclavian arteries measuring 9 mm and 7 mm in diameter on the  right than left respectively. The right subclavian and innominate  artery is moderately tortuous, where as there is only mild  tortuosity of the left subclavian artery.  Lungs/Pleura: Assessment of the lung parenchyma is limited by  considerable patient respiratory motion. With these limitations in  mind, there is no definite suspicious appearing pulmonary nodule or  mass. Patchy areas of ground-glass attenuation and mild subpleural  reticulation are noted throughout the lung bases bilaterally.   Subsegmental atelectasis or scarring in the medial segment  of the  right middle lobe and medial aspect of the right upper lobe. No  acute consolidative airspace disease. No pleural effusions.  Musculoskeletal: There are no aggressive appearing lytic or blastic  lesions noted in the visualized portions of the skeleton.  Review of the MIP images confirms the above findings.  IMPRESSION:  1. Extensively calcified and thickened aortic valve compatible  with the patient's reported history of aortic stenosis.  Atherosclerosis, including three-vessel coronary artery disease.  Please note that although the presence of coronary artery calcium  documents the presence of coronary artery disease, the severity of  this disease and any potential stenosis cannot be assessed on this  non-gated CT examination. Assessment for potential risk factor  modification, dietary therapy or pharmacologic therapy may be  warranted, if clinically indicated.  3. The subclavian arteries appear suitable for potential upper  extremity access for low-profile TAVR devices if clinically  desired.  4. Limited evaluation of the lung parenchyma demonstrates patchy  areas of probable scarring lungs bilaterally, as above.  CTA ABDOMEN AND PELVIS  VASCULAR MEASUREMENTS PERTINENT TO TAVR:  AORTA:  Minimal Aortic Diameter - 14 x 9 mm  Severity of Aortic Calcification - severe  RIGHT PELVIS:  Right Common Iliac Artery -  Minimal Diameter - 6.3 x 10.2 mm  Tortuosity - moderate  Calcification - moderate  Right External Iliac Artery -  Minimal Diameter - 6.6 x 7.1 mm  Tortuosity - mild  Calcification - minimal  Right Common Femoral Artery -  Minimal Diameter - 6.4 x 7.0 mm  Tortuosity - mild  Calcification - mild  LEFT PELVIS:  Left Common Iliac Artery -  Minimal Diameter - 6.9 x 6.8 mm  Tortuosity - mild  Calcification - moderate  Left External Iliac Artery -  Minimal Diameter - 6.1 x 6.8 mm  Tortuosity - mild   Calcification - minimal  Left Common Femoral Artery -  Minimal Diameter - 6.5 x 7.2 mm  Tortuosity - mild  Calcification - mild  Other Findings:  Abdomen/Pelvis: The appearance of the liver, gallbladder,  pancreas, spleen and bilateral adrenal glands is unremarkable.  There is significant parenchymal atrophy throughout the kidneys  bilaterally. Multiple low attenuation renal lesions are noted.  The smallest of these are too small to definitively characterize,  while the larger lesions are compatible with simple cysts, with the  largest cyst measuring 3 cm in diameter in the upper pole of the  right kidney posteriorly. An IVC filter is in place with tip  terminating immediately below the level of the right renal vein.  Numerous colonic diverticula are noted without surrounding  inflammatory changes to suggest an acute diverticulitis at this  time. Tiny umbilical hernia containing only omental fat  incidentally noted. No significant volume of ascites. No  pneumoperitoneum. No pathologic distension of small bowel. No  definite pathologic lymphadenopathy identified within the abdomen  or pelvis on today's examination. Status post hysterectomy.  Ovaries are atrophic. Urinary bladder is unremarkable in  appearance.  Musculoskeletal: There are no aggressive appearing lytic or blastic  lesions noted in the visualized portions of the skeleton.  Review of the MIP images confirms the above findings.  IMPRESSION:  1. Vascular findings and measurements pertinent to potential TAVR  procedure, as detailed above. The patient does appear to have  suitable pelvic arterial access for low-profile devices.  2. Additional incidental findings, as above.  Original Report Authenticated By: Trudie Reed, M.D.    Pulmonary Function Tests  Baseline  FVC 2.00 L (101% predicted)  FEV1 1.46 L (100% predicted)  FEF25-75 0.89 L (77% predicted)  RV 2.17 L (105% predicted)  DLCO 16.18%  predicted     Impression:  The patient has low gradient severe aortic stenosis with global nonischemic cardiomyopathy and progressive symptoms of congestive heart failure, now functionally class III-IV. Predicted risks associated with conventional surgical aortic valve replacement would be at least moderately elevated because of the patient's advanced age and significant comorbid medical conditions. However, I am concerned that the patient's risks associated with surgery would be much higher than that predicted because of the patient's very limited functional status with limited mobility, previous strokes, and at least mild dementia. Formal physical therapy evaluation has confirmed the presence of extremely limited additional capacity and at least moderate frailty.  Pulmonary function testing documents the presence of severely decreased diffusion capacity.  I feel the patient would likely do poorly with surgery and her risk of permanent morbidity would likely be greater than 50%. As a result I would not consider this patient a candidate for conventional surgical aortic valve replacement. Transcatheter aortic valve replacement might be reasonable alternative if aggressive treatment if desired. Alternatively, continued palliative medical therapy would not be unreasonable.  CT angiogram of the abdomen and pelvis demonstrate that the patient does appear to have adequate vascular access for transcatheter aortic valve replacement via transfemoral approach.  Cardiac gated CT angiogram of the heart confirms the presence of severe aortic stenosis but suggests the presence of a type I bicuspid aortic valve.  However, despite this the valve morphology appears relatively favorable for transcatheter aortic valve replacement as there does not appear to be asymmetrical calcification.    Plan:  I spent in excess of 45 minutes discussing treatment alternatives with the patient and her son in the office today. We  discussed concerns regarding the fact that her valve may in fact be bicuspid and that as a result the patient could conceivably be increased risk for malposition of the valve and/or perivalvular leak with transcatheter aortic valve replacement. They also understand the lack of randomized controlled data or FDA approval for transcatheter aortic valve replacement of patient's with a bicuspid valve. Despite this she desires to proceed with transcatheter aortic valve replacement in effort to improve her symptoms related to severe aortic stenosis and hopefully prolong her life.  The patient has been advised of a variety of complications that might develop including but not limited to risks of death, stroke, paravalvular leak, aortic dissection or other major vascular complications, aortic annulus rupture, device embolization, cardiac rupture or perforation, mitral regurgitation, acute myocardial infarction, arrhythmia, heart block or bradycardia requiring permanent pacemaker placement, congestive heart failure, respiratory failure, renal failure, pneumonia, infection, other late complications related to structural valve deterioration or migration, or other complications that might ultimately cause a temporary or permanent loss of functional independence or other long term morbidity.  Following the decision to proceed with transcatheter aortic valve replacement, a discussion has been held regarding what types of management strategies would be attempted intraoperatively in the event of life-threatening complications, including whether or not the patient would be considered a candidate for the use of cardiopulmonary bypass and/or conversion to open sternotomy for attempted surgical intervention.  They both agreed that conversion to open sternotomy would only be performed if the patient had a complication which was felt to be relatively straightforward to correct with open surgery, such as valve embolization.  The patient  and her son understand there is  a very significant possibility that cardiopulmonary bypass via tranfemoral cannulation may be necessary if she develops hemodynamic instability during the procedure.  The patient also understands that she will likely need temporary placement in a skilled nursing facility for her convalescence no matter how good she does postoperatively. All of their questions have been addressed.  We tentatively plan to proceed with transcatheter aortic valve replacement via open left transfemoral approach on Tuesday, 12/21/2012. The patient has been instructed to stop taking Coumadin after Wednesday, 12/14/2012 in anticipation of surgery.   Salvatore Decent. Cornelius Moras, MD 12/06/2012 5:44 PM

## 2012-12-07 ENCOUNTER — Other Ambulatory Visit: Payer: Self-pay | Admitting: *Deleted

## 2012-12-07 ENCOUNTER — Encounter: Payer: Self-pay | Admitting: Surgery

## 2012-12-07 ENCOUNTER — Institutional Professional Consult (permissible substitution) (INDEPENDENT_AMBULATORY_CARE_PROVIDER_SITE_OTHER): Payer: PRIVATE HEALTH INSURANCE | Admitting: Surgery

## 2012-12-07 VITALS — BP 143/82 | HR 92 | Resp 20 | Ht <= 58 in | Wt 157.0 lb

## 2012-12-07 DIAGNOSIS — I350 Nonrheumatic aortic (valve) stenosis: Secondary | ICD-10-CM

## 2012-12-07 DIAGNOSIS — I359 Nonrheumatic aortic valve disorder, unspecified: Secondary | ICD-10-CM

## 2012-12-07 DIAGNOSIS — I5043 Acute on chronic combined systolic (congestive) and diastolic (congestive) heart failure: Secondary | ICD-10-CM

## 2012-12-07 NOTE — Progress Notes (Signed)
[address]       [place] [address]             [phone]      CARDIOTHORACIC SURGERY CONSULTATION   PCP is Pearson Grippe, MD Referring Provider is Tonny Bollman, MD  Chief Complaint  Patient presents with  . Aortic Stenosis    Surgical eval for TAVR procedure, 2nd opinion    HPI:  Patient is a 79 year old white female with a long-standing history of congestive heart failure, hypertension, dilated nonischemic cardiomyopathy, aortic stenosis, paroxysmal atrial fibrillation with tachybrady syndrome who is status post permanent pacemaker placement, previous strokes, arthritis, and dementia. She is here with her son who she lives with. She states that she has been slowly going downhill from more than 10 years. She has had 2 or 3 strokes in the distant past, most recent in 2003 of unclear etiology. She has been on coumadin since. Following this she developed a progressively sedentary lifestyle and developed some memory loss that has prevented her from driving any longer. In March of 2012 she developed paroxysmal atrial fibrillation with tachybradycardia syndrome and syncope, further complicated by acute on chronic renal failure. She underwent placement of permanent pacemaker. At that time echocardiogram was reported to demonstrate the presence of moderate aortic stenosis with left ventricular ejection fraction 50%. She has been followed by Dr. Ladona Ridgel ever since. Over the past year the patient has developed progressive symptoms of exertional shortness of breath, currently functional class III-IV.Transthoracic echocardiogram was performed recently revealing decreased left ventricular systolic function with ejection fraction 35% and at least moderate to severe aortic stenosis. Peak velocity across the valve measured 3.5 m/s corresponding to peak and mean transvalvular gradients of 49 and 29 mmHg respectively. Left and right heart catheterization was performed by Dr. Excell Seltzer  showing nonobstructive coronary artery disease with at least moderate aortic stenosis. The mean gradient across the valve measured only 11 mmHg at cath with a valve area estimated at 1.2 cm. Given the possibility of low gradient severe aortic stenosis due to the patient's underlying severe left ventricular dysfunction, the patient was referred for dobutamine stress echocardiogram. This confirmed the presence of severe aortic stenosis with peak velocity across the aortic valve 4.0 m/s at peak stress corresponding to a mean transvalvular gradient of 38 mm mercury.   The patient lives a very sedentary lifestyle. She reports her primary limitation is that is severe exertional shortness of breath. She does use oxygen at home. She also has chronic pain related to severe degenerative arthritis in both knees as well as degenerative disc disease of the lumbosacral spine. She walks short distances using a cane. Her gait is unsteady and she is prone to falling. She is also limited by her chronic memory loss with both short and long-term memory disturbance.     Past Medical History  Diagnosis Date  . Syncope   . Symptomatic bradycardia   . Acute on chronic renal failure   . Chronic lower back pain   . Nonischemic cardiomyopathy   . HTN (hypertension)   . HLD (hyperlipidemia)   . Atrial fibrillation     tachy-brady syndrome with <1% recurrent PAF since pacemaker placement  . CKD (chronic kidney disease)   . H/O: stroke   . Pulmonary embolism     HISTORY OF, the pt. had a recurrent bilateral pulmonary emboli in 2005, on warfarin therapy and at which time she under went implantation of IVC filter  . Anemia   .  Dementia   . Gastroesophageal reflux disease   . Aortic stenosis 10/13/2012    Low EF, low gradient with severe aortic stenosis confirmed by dobutamine stress echocardiogram     Past Surgical History  Procedure Laterality Date  . Cardiac defibrillator placement  06/26/10    medtronic     History reviewed. No pertinent family history.  Social History History  Substance Use Topics  . Smoking status: Never Smoker   . Smokeless tobacco: Not on file  . Alcohol Use: Not on file    Current Outpatient Prescriptions  Medication Sig Dispense Refill  . alendronate (FOSAMAX) 70 MG tablet Take 70 mg by mouth once a week. Take with a full glass of water on an empty stomach.       Marland Kitchen amiodarone (PACERONE) 200 MG tablet Take 100 mg by mouth daily.       Marland Kitchen BIOTIN 5000 PO Take 5,000 mcg by mouth daily.       . calcitRIOL (ROCALTROL) 0.25 MCG capsule Take 0.25 mcg by mouth daily.       . carvedilol (COREG) 25 MG tablet Take 25 mg by mouth 2 (two) times daily with a meal.       . cetirizine (ZYRTEC) 10 MG tablet Take 10 mg by mouth daily.        . Cholecalciferol (VITAMIN D) 1000 UNITS capsule Take 2,000 Units by mouth daily.       . colchicine 0.6 MG tablet Take 0.6 mg by mouth every other day.      . docusate sodium (COLACE) 50 MG capsule Stool softner as directed       . donepezil (ARICEPT) 10 MG tablet Take 10 mg by mouth at bedtime as needed.       . DULoxetine (CYMBALTA) 60 MG capsule Take 60 mg by mouth daily.      Marland Kitchen esomeprazole (NEXIUM) 40 MG capsule Take 40 mg by mouth daily before breakfast.        . hydrALAZINE (APRESOLINE) 10 MG tablet Take 10 mg by mouth 3 (three) times daily.       Marland Kitchen HYDROcodone-acetaminophen (NORCO) 10-325 MG per tablet Take 1 tablet by mouth every 6 (six) hours as needed for pain.       Marland Kitchen levothyroxine (SYNTHROID, LEVOTHROID) 100 MCG tablet Take 100 mcg by mouth daily.       . Linaclotide 290 MCG CAPS Take 290 mcg by mouth daily.      . memantine (NAMENDA) 10 MG tablet Take 10 mg by mouth 2 (two) times daily.       . Multiple Vitamin (MULTIVITAMIN) tablet Take 1 tablet by mouth daily.        . nitroGLYCERIN (NITROSTAT) 0.4 MG SL tablet Place 0.4 mg under the tongue every 5 (five) minutes as needed.        . pravastatin (PRAVACHOL) 40 MG tablet Take  40 mg by mouth daily.       . pregabalin (LYRICA) 75 MG capsule Take 75 mg by mouth 2 (two) times daily.       Marland Kitchen telmisartan (MICARDIS) 40 MG tablet Take 20 mg by mouth daily.       Marland Kitchen torsemide (DEMADEX) 20 MG tablet Take 20 mg by mouth daily.      Marland Kitchen ULORIC 40 MG tablet Take 40 mg by mouth daily.       . vitamin B-12 (CYANOCOBALAMIN) 1000 MCG tablet Take 1,000 mcg by mouth 2 (two) times a week.       Marland Kitchen  warfarin (COUMADIN) 3 MG tablet Take 1.5-3 mg by mouth daily. Take 1.5 mg on Tues., and Fri.   Take 3 mg on M, W, Thurs., Sat., and Sunday.       No current facility-administered medications for this visit.   Facility-Administered Medications Ordered in Other Visits  Medication Dose Route Frequency Provider Last Rate Last Dose  . DOBUTamine (DOBUTREX) 1,000 mcg/mL in dextrose 5% 250 mL infusion  20 mcg/kg/min (Order-Specific) Intravenous Continuous Marinus Maw, MD 81.8 mL/hr at 10/26/12 1630 20 mcg/kg/min at 10/26/12 1630  . sodium bicarbonate first hour bolus via infusion   Intravenous Once Massie Maroon, MD        Allergies  Allergen Reactions  . Codeine Hives  . Darvocet [Propoxyphene-Acetaminophen] Hives  . Darvon Hives  . Nubain [Nalbuphine Hcl] Hives    Review of Systems  Constitutional: Positive for diaphoresis, activity change and fatigue. Negative for fever, chills, appetite change and unexpected weight change.  HENT: Negative.        Wears dentures  Eyes:       Blurry vision  Respiratory: Positive for cough and shortness of breath.        Wears oxygen at night  Shortness of breath with minimal exertion and sometimes at rest.  Cardiovascular: Positive for chest pain and leg swelling. Negative for palpitations.       Chest discomfort with exertion  Gastrointestinal: Positive for diarrhea and constipation. Negative for nausea, vomiting, abdominal pain and blood in stool.  Endocrine: Negative.        Chronic kidney disease  Musculoskeletal: Positive for arthralgias.  Negative for joint swelling.       Both knees. Very limited mobility. Walks with cane bent over.  Skin: Negative.   Allergic/Immunologic: Negative.   Neurological:       Remote stroke x 2. Peripheral neuropathy and chronic pain. Memory loss/cognitive dysfunction  Hematological: Bruises/bleeds easily.       On chronic coumadin since strokes.  Psychiatric/Behavioral: Positive for dysphoric mood. Negative for suicidal ideas, hallucinations, behavioral problems and confusion. The patient is not nervous/anxious.     BP 143/82  Pulse 92  Resp 20  Ht 4\' 10"  (1.473 m)  Wt 157 lb (71.215 kg)  BMI 32.82 kg/m2  SpO2 97% Physical Exam  Constitutional: She is oriented to person, place, and time.  Elderly, frail-appearing but alert and conversant. Looks dyspneic with talking.  HENT:  Head: Normocephalic and atraumatic.  Mouth/Throat: Oropharynx is clear and moist.  Eyes: Conjunctivae and EOM are normal. Pupils are equal, round, and reactive to light.  Neck: Normal range of motion. Neck supple. No JVD present. No tracheal deviation present. No thyromegaly present.  Cardiovascular: Normal rate, regular rhythm and intact distal pulses.   Murmur heard. 3/6 crescendo/decrescendo murmur  Pulmonary/Chest: Breath sounds normal. She is in respiratory distress. She has no rales.  Dyspneic with talking  Abdominal: Soft. Bowel sounds are normal. She exhibits no distension and no mass. There is no tenderness.  Musculoskeletal: She exhibits edema.  Mild in ankles and feet.  Lymphadenopathy:    She has no cervical adenopathy.  Neurological: She is alert and oriented to person, place, and time. She has normal strength. No cranial nerve deficit or sensory deficit.  Skin: Skin is warm and dry. No rash noted. No erythema.  Psychiatric: She has a normal mood and affect.     Diagnostic Tests:  Transthoracic Echocardiography  Patient: [name], [name] MR #: [id number] Study Date: 09/22/2012 Gender:  F Age: 8 Height: 152.4cm Weight: 69.9kg BSA: 1.110m^2 Pt. Status: Room:  Armond Hang, Sharlot Gowda PERFORMING [facility], [facility] SONOGRAPHER Philomena Course, RDCS ATTENDING Edmisten, Brooke ORDERING Edmisten, Brooke REFERRING Cashiers, Brooke cc:  ------------------------------------------------------------ LV EF: 35%  ------------------------------------------------------------ Indications: Dyspnea 786.09.  ------------------------------------------------------------ History: PMH: Tachy-brady syndrome. IVC filter. Acquired from the patient and from the patient's chart. Syncope and dyspnea. Paroxysmal Atrial fibrillation. Non-ischemic cardiomyopathy. Congestive heart failure. Moderate aortic stenosis. Stroke. Risk factors: Hypertension. Dyslipidemia.  ------------------------------------------------------------ Study Conclusions  - Left ventricle: Hypokinesis worse in inferior wall The cavity size was normal. Wall thickness was increased in a pattern of mild LVH. The estimated ejection fraction was 35%. Diffuse hypokinesis. Hypokinesis of the entire myocardium. Doppler parameters are consistent with abnormal left ventricular relaxation (grade 1 diastolic dysfunction). - Aortic valve: There was moderate to severe stenosis. Mild regurgitation. - Mitral valve: Mild regurgitation. - Left atrium: The atrium was mildly dilated. - Right ventricle: Systolic function was reduced. - Atrial septum: No defect or patent foramen ovale was identified. - Pericardium, extracardiac: There was a left pleural effusion. - Impressions: Degree of AS difficult to judge due to significant LV dysfunction. Dobutamine echo may be worth while to assess contractile reserve and change in AVA with inotropes Impressions:  - Degree of AS difficult to judge due to significant LV dysfunction. Dobutamine echo may be worth while to assess contractile reserve and change in AVA with  inotropes  ------------------------------------------------------------ Labs, prior tests, procedures, and surgery: Permanent pacemaker system implantation.  Transthoracic echocardiography. M-mode, complete 2D, spectral Doppler, and color Doppler. Height: Height: 152.4cm. Height: 60in. Weight: Weight: 69.9kg. Weight: 153.7lb. Body mass index: BMI: 30.1kg/m^2. Body surface area: BSA: 1.21m^2. Blood pressure: 151/85. Patient status: Outpatient. Location: [facility]  ------------------------------------------------------------  ------------------------------------------------------------ Left ventricle: Hypokinesis worse in inferior wall The cavity size was normal. Wall thickness was increased in a pattern of mild LVH. The estimated ejection fraction was 35%. Diffuse hypokinesis. Regional wall motion abnormalities: Hypokinesis of the entire myocardium. Doppler parameters are consistent with abnormal left ventricular relaxation (grade 1 diastolic dysfunction).  ------------------------------------------------------------ Aortic valve: Trileaflet; moderately calcified leaflets. Doppler: There was moderate to severe stenosis. Mild regurgitation. VTI ratio of LVOT to aortic valve: 0.18. Valve area: 0.94cm^2(VTI). Indexed valve area: 0.56cm^2/m^2 (VTI). Peak velocity ratio of LVOT to aortic valve: 0.18. Valve area: 0.96cm^2 (Vmax). Indexed valve area: 0.58cm^2/m^2 (Vmax). Mean gradient: 29mm Hg (S). Peak gradient: 49mm Hg (S).  ------------------------------------------------------------ Aorta: The aorta was normal, not dilated, and non-diseased.  ------------------------------------------------------------ Mitral valve: Mildly thickened leaflets . Leaflet separation was normal. Doppler: Transvalvular velocity was within the normal range. There was no evidence for stenosis. Mild regurgitation.  ------------------------------------------------------------ Left atrium: The  atrium was mildly dilated.  ------------------------------------------------------------ Atrial septum: No defect or patent foramen ovale was identified.  ------------------------------------------------------------ Right ventricle: The cavity size was normal. Wall thickness was normal. Pacer wire or catheter noted in right ventricle. Systolic function was reduced.  ------------------------------------------------------------ Pulmonic valve: Structurally normal valve. Cusp separation was normal. Doppler: Transvalvular velocity was within the normal range. Mild regurgitation.  ------------------------------------------------------------ Tricuspid valve: Structurally normal valve. Leaflet separation was normal. Doppler: Transvalvular velocity was within the normal range. Mild regurgitation.  ------------------------------------------------------------ Pulmonary artery: The main pulmonary artery was normal-sized.  ------------------------------------------------------------ Right atrium: The atrium was normal in size. Pacer wire or catheter noted in right atrium.  ------------------------------------------------------------ Pericardium: The pericardium was normal in appearance.  ------------------------------------------------------------ Systemic veins: Inferior vena cava: The vessel was normal in size; the respirophasic diameter changes were  in the normal range (= 50%); findings are consistent with normal central venous pressure.  ------------------------------------------------------------ Pleura: There was a left pleural effusion.  ------------------------------------------------------------ Post procedure conclusions Ascending Aorta:  - The aorta was normal, not dilated, and non-diseased.  ------------------------------------------------------------  2D measurements Normal Doppler measurements Norma Left ventricle l LVID ED, 53.5 mm 43-52 Main pulmonary  artery chord, Pressure, 28 mm Hg =30 PLAX S LVID ES, 44.5 mm 23-38 Left ventricle chord, Ea, lat 5.6 cm/s ----- PLAX ann, tiss 5 FS, chord, 17 % >29 DP PLAX E/Ea, lat 10. ----- LVPW, ED 13 mm ------ ann, tiss 12 IVS/LVPW 0.94 <1.3 DP ratio, ED Ea, med 4 cm/s ----- Ventricular septum ann, tiss IVS, ED 12.2 mm ------ DP LVOT E/Ea, med 14. ----- Diam, S 26 mm ------ ann, tiss 3 Area 5.31 cm^2 ------ DP Diam 26 mm ------ LVOT Aorta Peak vel, 63. cm/s ----- Root diam, 31 mm ------ S 7 ED VTI, S 15 cm ----- Left atrium HR 66 bpm ----- AP dim 46 mm ------ Stroke vol 79. ml ----- AP dim 2.75 cm/m^2 <2.2 6 index Cardiac 5.3 L/min ----- output Cardiac 3.1 L/(min-m ----- index ^2) Stroke 47. ml/m^2 ----- index 7 Aortic valve Peak vel, 351 cm/s ----- S Mean vel, 246 cm/s ----- S VTI, S 85. cm ----- 1 Mean 29 mm Hg ----- gradient, S Peak 49 mm Hg ----- gradient, S VTI ratio 0.1 ----- LVOT/AV 8 Area, VTI 0.9 cm^2 ----- 4 Area index 0.5 cm^2/m^2 ----- (VTI) 6 Peak vel 0.1 ----- ratio, 8 LVOT/AV Area, Vmax 0.9 cm^2 ----- 6 Area index 0.5 cm^2/m^2 ----- (Vmax) 8 Regurg PHT 560 ms ----- Mitral valve Peak E vel 57. cm/s ----- 2 Peak A vel 66. cm/s ----- 5 Decelerati 194 ms 150-2 on time 30 Peak E/A 0.9 ----- ratio Tricuspid valve Regurg 238 cm/s ----- peak vel Peak RV-RA 23 mm Hg ----- gradient, S Systemic veins Estimated 5 mm Hg ----- CVP Right ventricle Pressure, 28 mm Hg <30 S Sa vel, 9.5 cm/s ----- lat ann, 5 tiss DP  ------------------------------------------------------------ Prepared and Electronically Authenticated by  Charlton Haws 2014-06-04T15:11:[id number]    Cardiac Catheterization Procedure Note   Name: [name]  MRN: [id number]  DOB: April 13, 1933  Procedure: Right Heart Cath, Left Heart Cath, Selective Coronary Angiography, aortic root angiography  Indication: Aortic stenosis and left ventricular dysfunction  Procedural  Details: The right groin was prepped, draped, and anesthetized with 1% lidocaine. Using the modified Seldinger technique a 5 French sheath was placed in the right femoral artery and a 6 French sheath was placed in the right femoral vein. A multipurpose catheter was used for the right heart catheterization. Caution was taken when crossing the patient's IVC filter. This was crossed directly under fluoroscopic visualization. Standard protocol was followed for recording of right heart pressures and sampling of oxygen saturations. Fick cardiac output was calculated. Standard Judkins catheters were used for selective coronary angiography and left ventriculography. There were no immediate procedural complications. The patient was transferred to the post catheterization recovery area for further monitoring.  Procedural Findings:  Hemodynamics  RA mean of 3  RV 31/5  PA 31/10 with a mean of 19  PCWP 7, A wave 9, V wave 9  LV 188/20  AO 172/62 with a mean of 100  Oxygen saturations:  PA 59  AO 94  Cardiac Output (Fick) 4  Cardiac Index (Fick) 2.37  Aortic valve hemodynamics: Peak to peak gradient 17, mean gradient 11,  aortic valve area 1.2 cm     Coronary angiography:  Coronary dominance: right  Left mainstem: The left main is very short. There is mild calcification. The left main arises from the left cusp it divides into the LAD and left circumflex.  Left anterior descending (LAD): The LAD is patent to the apex. The first diagonal is patent. There is minor irregularity in the LAD and diagonal but there are no significant stenoses present.  Left circumflex (LCx): The left circumflex is large in caliber. There is a tiny first OM and large second OM that divides into 2 branch vessels. There is no significant stenosis in the left circumflex.  Right coronary artery (RCA): The right coronary artery has an inferiorly directed origin. There is calcification in the right cusp. There is minor irregularity in  the proximal RCA with a widely patent lumen. There is diffuse minor irregularity throughout the course of the RCA. The PDA and PLA branches are patent.  Left ventriculography: Deferred because of chronic kidney disease. LVEF by echo is 35%.  Aortic root angiography: The aortic valve is moderately to severely calcified. There is mild aortic insufficiency. The a standing aorta does not appear to be heavily calcified.  Final Conclusions:  1. Patent coronary arteries with mild nonobstructive disease present.  2. Moderate aortic stenosis, question low gradient aortic stenosis  3. Known moderately severe left ventricular systolic dysfunction.  4. Essentially normal right heart hemodynamics  Recommendations: The patient's aortic valve is calcified and at least moderately difficult to cross with a straight wire. I am suspicious that she has low gradient severe aortic stenosis. She does not have significant coronary artery disease. Recommend dobutamine stress echocardiography to assess for contractile reserve and significance of aortic stenosis.  Tonny Bollman  10/18/2012, 12:04 PM    Dobutamine Stress Echocardiography  Patient: [name], [name] MR #: [id number] Study Date: 10/26/2012 Gender: F Age: 55 Height: 152.4cm Weight: 71.4kg BSA: 1.74m^2 Pt. Status: Room:  Armond Hang, Sharlot Gowda REFERRING Lewayne Bunting ATTENDING Olga Millers PERFORMING [facility], [facility] SONOGRAPHER Junious Dresser, RDCS cc:  ------------------------------------------------------------  ------------------------------------------------------------ Indications: Aortic stenosis 424.1.  ------------------------------------------------------------ History: PMH: Tachy-brady syndrome. IVC filter. Acquired from the patient and from the patient's chart. Syncope and dyspnea. Atrial fibrillation. Non-ischemic cardiomyopathy, with congestive heart failure, with an ejection fraction of 35%by echocardiography. Borderline  significant aortic stenosis.  ------------------------------------------------------------ Study Conclusions  - Aortic valve: Valve area: 0.76cm^2(VTI). Valve area: 0.83cm^2 (Vmax). - Stress ECG conclusions: The stress ECG was normal. Impressions:  - Dobutamine echocardiogram to assess severity of aortic stenosis in patient with severe LV dysfunction. Rest images reveal EF 20-25; peak velocity of 3.4 m/s, mean gradient of 26 mmHg and AVA 0.9 cm2.  Dobutamine 5ug - LV function unchanged.  Dobutamine 10 ug - some augmentation in LV function; peak velocity 3.9 m/s, mean gradient 38 mmHg and AVA 0.7 cm2.  Dobutamine 20 ug - LV function continues with some augmentation; peak velocity 4 m/s, mean gradient 38 mmHg, AVA 0.7 cm2.  Findings suggest true, severe aortic stenosis and not pseudostenosis.  ------------------------------------------------------------ Labs, prior tests, procedures, and surgery: Echocardiography (June 2014). The aortic valve showed moderate to severe stenosis and mild regurgitation. EF was 35%. Aortic valve: peak gradient of 49mm Hg and mean gradient of 29mm Hg.  Catheterization. Aortic valve: mean gradient of 11mm Hg and area of 1.2cm^2. Dobutamine. Stress echocardiography. 2D. Height: Height: 152.4cm. Height: 60in. Weight: Weight: 71.4kg. Weight: 157lb. Body mass index: BMI: 30.7kg/m^2. Body surface area: BSA: 1.17m^2.  Patient status: Outpatient.  ------------------------------------------------------------  ------------------------------------------------------------ Aortic valve: Doppler: VTI ratio of LVOT to aortic valve: 0.17. Valve area: 0.76cm^2(VTI). Indexed valve area: 0.43cm^2/m^2 (VTI). Peak velocity ratio of LVOT to aortic valve: 0.18. Valve area: 0.83cm^2 (Vmax). Indexed valve area: 0.47cm^2/m^2 (Vmax). Mean gradient: 34mm Hg (S). Peak gradient: 58mm Hg (S).  ------------------------------------------------------------ Baseline ECG:  Normal sinus rhythm, septal MI, IVCD, inferior TWI.  ------------------------------------------------------------ Stress protocol:  +-----------------------+--+-------------+---+--------+ Stage HRBP (mmHg) SatSymptoms +-----------------------+--+-------------+---+--------+ Baseline 61109/72 (84) 94%None  +-----------------------+--+-------------+---+--------+ Dobutamine 5 ug/kg/min 62123/91 (102) 92%None  +-----------------------+--+-------------+---+--------+ Dobutamine 10 ug/kg/min60142/91 (108) 92%None  +-----------------------+--+-------------+---+--------+ Dobutamine 15 ug/kg/min---------------95%-------- +-----------------------+--+-------------+---+--------+ Dobutamine 20 ug/kg/min60192/94 (127) 93%Dyspnea  +-----------------------+--+-------------+---+--------+ Immediate post stress 60210/95 (133) 94%None  +-----------------------+--+-------------+---+--------+ Recovery; 1 min 60-------------94%None  +-----------------------+--+-------------+---+--------+ Recovery; 2 min 62-------------93%None  +-----------------------+--+-------------+---+--------+ Recovery; 3 min 60213/94 (134) 92%None  +-----------------------+--+-------------+---+--------+ Recovery; 4 min 68-------------93%None  +-----------------------+--+-------------+---+--------+ Recovery; 5 min 60198/94 (129) 92%None  +-----------------------+--+-------------+---+--------+ Recovery; 6 min 60-------------93%None  +-----------------------+--+-------------+---+--------+ Recovery; 7 min 65150/106 (121)93%None  +-----------------------+--+-------------+---+--------+ Late recovery 66-------------93%None  +-----------------------+--+-------------+---+--------+  ------------------------------------------------------------ Stress results: Maximal heart rate during stress was 68bpm (53% of maximal predicted heart rate). The maximal  predicted heart rate was 142bpm.The target heart rate was not achieved. The heart rate response to stress was blunted. There was a normal resting blood pressure. Abnormal blood pressure response to dobutamine. The rate-pressure product for the peak heart rate and blood pressure was Hg/min. The patient experienced no chest pain during stress.  ------------------------------------------------------------ Stress ECG: Isolated ventricular ectopy and couplets. The stress ECG was normal.  ------------------------------------------------------------ Baseline:  Low dose: Peak stress: Recovery:  ------------------------------------------------------------  2D measurements Normal Doppler measurements Norma LVOT l Diam, S 24 mm ------ LVOT Area 4.52 cm^2 ------ Peak vel, 69. cm/s ----- S 9 VTI, S 15. cm ----- 5 Aortic valve Peak vel, 382 cm/s ----- S Mean vel, 277 cm/s ----- S VTI, S 92. cm ----- 7 Mean 34 mm Hg ----- gradient, S Peak 58 mm Hg ----- gradient, S VTI ratio 0.1 ----- LVOT/AV 7 Area, VTI 0.7 cm^2 ----- 6 Area 0.4 cm^2/m^2 ----- index 3 (VTI) Peak vel 0.1 ----- ratio, 8 LVOT/AV Area, 0.8 cm^2 ----- Vmax 3 Area 0.4 cm^2/m^2 ----- index 7 (Vmax)  ------------------------------------------------------------ Prepared and Electronically Authenticated by  Olga Millers 2014-07-08T18:46:[id number]      STS Risk Calculator  Procedure AVR  Risk of Mortality 6.1%  Morbidity or Mortality 33.0%  Prolonged LOS 19.3%  Short LOS 12.0%  Permanent Stroke 3.9%  Prolonged Vent Support 27.2%  DSW Infection 0.4%  Renal Failure 7.4%  Reoperation 12.3%    CT ANGIOGRAPHY OF THE HEART, CORONARY ARTERY, STRUCTURE, AND  MORPHOLOGY  COMPARISON: Chest CT 11/10/2012.  CONTRAST: 80mL OMNIPAQUE IOHEXOL 350 MG/ML SOLN  TECHNIQUE: CT angiography of the coronary vessels was performed on  a 256 channel system using prospective ECG gating. A scout and ECG-  gated  noncontrast exam (for calcium scoring) were performed.  Appropriate delay was determined by bolus tracking after injection  of iodinated contrast, and an ECG-gated coronary CTA was performed  with sub-mm slice collimation during throughout the cardiac cycle.  Imaging post processing was performed on an independent workstation  creating multiplanar and 3-D images, allowing for quantitative  analysis of the heart and coronary arteries. Note that this exam  targets the heart and the chest was not imaged in its entirety.  PREMEDICATION:  Lopressor 10 mg, IV  FINDINGS:  Technical quality: Diagnostic for evaluation of the aortic root.  Nondiagnostic for evaluation of the coronary arteries secondary to  the patient's rapid and irregular rhythm at the time of image  acquisition.  Heart rate: 70-75 with ectopy.  CORONARY ARTERIES:  Evaluation of the coronary arteries is suboptimal related to the  patient's rapid and irregular rhythm at the time of image  acquisition. There is mild left main and three-vessel coronary  artery disease, however, this appears to be nonobstructive on  today's limited images.  AORTIC ROOT FINDINGS AND MEASUREMENTS PERTINENT TO POTENTIAL TAVR:  Aortic Valve Description: The aortic valve is markedly thickened  and heavily calcified. This valve appears to represent a type 1  bicuspid aortic valve (separate noncoronary cusp, and fused left  and right cusp moieties with an intervening median raphe). During  systole, there is limited opening of the valve, with an estimated  cross-sectional area of the approximately 0.85 cm2 by planimetry.  During diastole there is a tiny central region of malcoaptation  (approximately 2 mm squared), suggesting trace aortic  insufficiency.  Annulus:  Planimetry - 4.96 cm2 (calculated diameter 25.1 mm)  Long & Short Axis - 27.2 x 25.0 mm (calculated diameter 26.1  mm)  Circumference - 82.0 mm (calculated diameter 26.1 mm)  Sinuses of  Valsalva:  R SOV - width 32.2 mm  height 25.0 mm  L SOV - width 33.4 mm  height 16.2 mm  [place] SOV - width 33.5 mm  height 25.2 mm  Coronary Artery [place]:  L main - 12.4 mm, 14.3 mm and 15.8 mm from the annulus to the  inferior,  mid and superior aspect of the left main coronary  artery ostium.  RCA - 14.6 mm from the annulus  Ascending Aorta:  35.0 x 37.7 mm in diameter 4 cm above the annulus.  OTHER AORTA AND PULMONARY MEASUREMENTS:  Descending aorta: (< 40 mm): 26 mm  Main pulmonary artery: (< 30 mm): 25 mm  OTHER FINDINGS:  Patchy linear opacities throughout the lung parenchyma are again  noted, most compatible with areas of mild chronic scarring. No  acute consolidative airspace disease or suspicious appearing  pulmonary nodules are noted within the visualized thorax. No  pleural effusions. Visualized portions of the upper abdomen are  unremarkable. Pacemaker leads terminate in the right atrial  appendage and right ventricular apex.  IMPRESSION:  1. Findings and measurements pertinent to potential TAVR  procedure, as above. Notably, the aortic valve appears to be a  type 1 bicuspid aortic valve, with fusion of the left and right  cusp moieties with an intervening median raphe.  2. The aortic valve is severe sclerotic, with estimated aortic  valve area approximately 0.85 cm2 by planimetry. There is also  evidence to suggest trace aortic insufficiency.  3. Calculated annular diameter of 25-26 mm, as above.  4. Limited evaluation of the coronary arteries secondary to the  rapid and irregular rhythm at the time of image acquisition. With  these limitations in mind, there appears to be only mild  nonobstructive multivessel coronary artery disease.  Original Report Authenticated By: Trudie Reed, M.D.    CT ANGIOGRAPHY CHEST, ABDOMEN AND PELVIS   Technique: Multidetector CT imaging through the chest, abdomen and  pelvis was performed using the standard protocol during  bolus  administration of intravenous contrast. Multiplanar reconstructed  images including MIPs were obtained and reviewed to evaluate the  vascular anatomy.  Contrast: OMNIPAQUE IOHEXOL 350 MG/ML SOLN  Comparison: Chest CT 10/26/2003.  CTA CHEST  Findings:  Mediastinum: Heart size is enlarged. There is no significant  pericardial fluid, thickening  or pericardial calcification. Right-  sided pacemaker device in place with lead tips terminating in the  right atrial appendage and right ventricular apex. There is  atherosclerosis of the thoracic aorta, the great vessels of the  mediastinum and the coronary arteries, including calcified  atherosclerotic plaque in the left anterior descending, left  circumflex and right coronary arteries. No pathologically enlarged  mediastinal or hilar lymph nodes. Esophagus is unremarkable in  appearance. Thickening and calcification of the aortic valve.  Subclavian arteries measuring 9 mm and 7 mm in diameter on the  right than left respectively. The right subclavian and innominate  artery is moderately tortuous, where as there is only mild  tortuosity of the left subclavian artery.  Lungs/Pleura: Assessment of the lung parenchyma is limited by  considerable patient respiratory motion. With these limitations in  mind, there is no definite suspicious appearing pulmonary nodule or  mass. Patchy areas of ground-glass attenuation and mild subpleural  reticulation are noted throughout the lung bases bilaterally.  Subsegmental atelectasis or scarring in the medial segment of the  right middle lobe and medial aspect of the right upper lobe. No  acute consolidative airspace disease. No pleural effusions.  Musculoskeletal: There are no aggressive appearing lytic or blastic  lesions noted in the visualized portions of the skeleton.  Review of the MIP images confirms the above findings.  IMPRESSION:  1. Extensively calcified and thickened aortic valve  compatible  with the patient's reported history of aortic stenosis.  Atherosclerosis, including three-vessel coronary artery disease.  Please note that although the presence of coronary artery calcium  documents the presence of coronary artery disease, the severity of  this disease and any potential stenosis cannot be assessed on this  non-gated CT examination. Assessment for potential risk factor  modification, dietary therapy or pharmacologic therapy may be  warranted, if clinically indicated.  3. The subclavian arteries appear suitable for potential upper  extremity access for low-profile TAVR devices if clinically  desired.  4. Limited evaluation of the lung parenchyma demonstrates patchy  areas of probable scarring lungs bilaterally, as above.  CTA ABDOMEN AND PELVIS  VASCULAR MEASUREMENTS PERTINENT TO TAVR:  AORTA:  Minimal Aortic Diameter - 14 x 9 mm  Severity of Aortic Calcification - severe  RIGHT PELVIS:  Right Common Iliac Artery -  Minimal Diameter - 6.3 x 10.2 mm  Tortuosity - moderate  Calcification - moderate  Right External Iliac Artery -  Minimal Diameter - 6.6 x 7.1 mm  Tortuosity - mild  Calcification - minimal  Right Common Femoral Artery -  Minimal Diameter - 6.4 x 7.0 mm  Tortuosity - mild  Calcification - mild  LEFT PELVIS:  Left Common Iliac Artery -  Minimal Diameter - 6.9 x 6.8 mm  Tortuosity - mild  Calcification - moderate  Left External Iliac Artery -  Minimal Diameter - 6.1 x 6.8 mm  Tortuosity - mild  Calcification - minimal  Left Common Femoral Artery -  Minimal Diameter - 6.5 x 7.2 mm  Tortuosity - mild  Calcification - mild  Other Findings:  Abdomen/Pelvis: The appearance of the liver, gallbladder,  pancreas, spleen and bilateral adrenal glands is unremarkable.  There is significant parenchymal atrophy throughout the kidneys  bilaterally. Multiple low attenuation renal lesions are noted.  The smallest of these are too small to  definitively characterize,  while the larger lesions are compatible with simple cysts, with the  largest cyst measuring 3 cm in diameter in the upper pole  of the  right kidney posteriorly. An IVC filter is in place with tip  terminating immediately below the level of the right renal vein.  Numerous colonic diverticula are noted without surrounding  inflammatory changes to suggest an acute diverticulitis at this  time. Tiny umbilical hernia containing only omental fat  incidentally noted. No significant volume of ascites. No  pneumoperitoneum. No pathologic distension of small bowel. No  definite pathologic lymphadenopathy identified within the abdomen  or pelvis on today's examination. Status post hysterectomy.  Ovaries are atrophic. Urinary bladder is unremarkable in  appearance.  Musculoskeletal: There are no aggressive appearing lytic or blastic  lesions noted in the visualized portions of the skeleton.  Review of the MIP images confirms the above findings.  IMPRESSION:  1. Vascular findings and measurements pertinent to potential TAVR  procedure, as detailed above. The patient does appear to have  suitable pelvic arterial access for low-profile devices.  2. Additional incidental findings, as above.  Original Report Authenticated By: Trudie Reed, M.D.    Pulmonary Function Tests  Baseline  FVC 2.00 L (101% predicted)  FEV1 1.46 L (100% predicted)  FEF25-75 0.89 L (77% predicted)  RV 2.17 L (105% predicted)  DLCO 16.18% predicted      Impression:  The patient has low gradient severe aortic stenosis with global nonischemic cardiomyopathy and progressive symptoms of congestive heart failure, now functionally class III-IV. She would be at extremely high risk for conventional surgical aortic valve replacement given her advanced age and significant comorbid medical conditions. I think  that the patient's risks associated with surgery would be much higher than that predicted by  the STS score because of the patient's very limited functional status with limited mobility, previous strokes, and at least mild dementia. Formal physical therapy evaluation has confirmed the presence of extremely limited additional capacity and at least moderate frailty. Pulmonary function testing documents the presence of severely decreased diffusion capacity. I agree that the patient would do poorly with surgery and her risk of permanent morbidity would likely be greater than 50%. As a result I would not consider this patient a candidate for conventional surgical aortic valve replacement. Transcatheter aortic valve replacement is a reasonable alternative if aggressive treatment if desired. I discussed the option of palliative medical therapy with the patient and her son and they would like to pursue surgical therapy if possible to try to improve her functional state and dyspnea. CT angiogram of the abdomen and pelvis demonstrate that the patient does appear to have adequate vascular access for transcatheter aortic valve replacement via transfemoral approach. Cardiac gated CT angiogram of the heart confirms the presence of severe aortic stenosis but suggests the presence of a type I bicuspid aortic valve. However, despite this the valve morphology appears relatively favorable for transcatheter aortic valve replacement as there does not appear to be asymmetrical calcification. They also understand the lack of randomized controlled data or FDA approval for transcatheter aortic valve replacement of patient's with a bicuspid valve. Despite this she desires to proceed with transcatheter aortic valve replacement in effort to improve her symptoms related to severe aortic stenosis and hopefully prolong her life. The patient has been advised of a variety of complications that might develop including but not limited to risks of death, stroke, paravalvular leak, aortic dissection or other major vascular complications, aortic  annulus rupture, device embolization, cardiac rupture or perforation, mitral regurgitation, acute myocardial infarction, arrhythmia, heart block or bradycardia requiring permanent pacemaker placement, congestive heart failure, respiratory failure,  renal failure, pneumonia, infection, other late complications related to structural valve deterioration or migration, or other complications that might ultimately cause a temporary or permanent loss of functional independence or other long term morbidity. Following the decision to proceed with transcatheter aortic valve replacement, a discussion has been held regarding what types of management strategies would be attempted intraoperatively in the event of life-threatening complications, including whether or not the patient would be considered a candidate for the use of cardiopulmonary bypass and/or conversion to open sternotomy for attempted surgical intervention. They both agreed that conversion to open sternotomy would only be performed if the patient had a complication which was felt to be relatively straightforward to correct with open surgery. The patient and her son understand there is a very significant possibility that cardiopulmonary bypass via tranfemoral cannulation may be necessary if she develops hemodynamic instability during the procedure. The patient also understands that she will likely need temporary placement in a skilled nursing facility for recovery since her son is going to have surgery himself in the next week.    Plan:  We tentatively plan to proceed with transcatheter aortic valve replacement via open left transfemoral approach on Tuesday, 12/21/2012. The patient has been instructed to stop taking Coumadin after Wednesday, 12/14/2012 in anticipation of surgery.

## 2012-12-13 ENCOUNTER — Encounter (HOSPITAL_COMMUNITY): Payer: Self-pay | Admitting: Pharmacy Technician

## 2012-12-16 ENCOUNTER — Encounter (HOSPITAL_COMMUNITY)
Admission: RE | Admit: 2012-12-16 | Discharge: 2012-12-16 | Disposition: A | Discharge status: Home / Self Care | Payer: PRIVATE HEALTH INSURANCE | Source: Ambulatory Visit | Attending: Cardiovascular Disease | Admitting: Cardiovascular Disease

## 2012-12-16 ENCOUNTER — Telehealth: Payer: Self-pay | Admitting: *Deleted

## 2012-12-16 ENCOUNTER — Other Ambulatory Visit: Payer: Self-pay | Admitting: *Deleted

## 2012-12-16 ENCOUNTER — Encounter (HOSPITAL_COMMUNITY): Payer: Self-pay

## 2012-12-16 VITALS — BP 120/75 | HR 79 | Temp 97.8°F | Resp 20 | Ht <= 58 in | Wt 158.1 lb

## 2012-12-16 DIAGNOSIS — I359 Nonrheumatic aortic valve disorder, unspecified: Secondary | ICD-10-CM

## 2012-12-16 DIAGNOSIS — Z01811 Encounter for preprocedural respiratory examination: Secondary | ICD-10-CM | POA: Insufficient documentation

## 2012-12-16 DIAGNOSIS — Z01812 Encounter for preprocedural laboratory examination: Secondary | ICD-10-CM | POA: Insufficient documentation

## 2012-12-16 DIAGNOSIS — Z01818 Encounter for other preprocedural examination: Secondary | ICD-10-CM | POA: Insufficient documentation

## 2012-12-16 DIAGNOSIS — B958 Unspecified staphylococcus as the cause of diseases classified elsewhere: Secondary | ICD-10-CM

## 2012-12-16 DIAGNOSIS — Z01810 Encounter for preprocedural cardiovascular examination: Secondary | ICD-10-CM | POA: Insufficient documentation

## 2012-12-16 HISTORY — DX: Spinal stenosis, site unspecified: M48.00

## 2012-12-16 HISTORY — DX: Sleep apnea, unspecified: G47.30

## 2012-12-16 HISTORY — DX: Nausea with vomiting, unspecified: R11.2

## 2012-12-16 HISTORY — DX: Other specified postprocedural states: Z98.890

## 2012-12-16 HISTORY — DX: Unspecified osteoarthritis, unspecified site: M19.90

## 2012-12-16 HISTORY — DX: Polyneuropathy, unspecified: G62.9

## 2012-12-16 LAB — PROTIME-INR
INR: 1.42 (ref 0.00–1.49)
Prothrombin Time: 17 seconds — ABNORMAL HIGH (ref 11.6–15.2)

## 2012-12-16 LAB — COMPREHENSIVE METABOLIC PANEL
ALT: 17 U/L (ref 0–35)
Alkaline Phosphatase: 63 U/L (ref 39–117)
BUN: 35 mg/dL — ABNORMAL HIGH (ref 6–23)
CO2: 25 mEq/L (ref 19–32)
Calcium: 9.4 mg/dL (ref 8.4–10.5)
GFR calc Af Amer: 38 mL/min — ABNORMAL LOW (ref >90)
GFR calc non Af Amer: 33 mL/min — ABNORMAL LOW (ref >90)
Glucose, Bld: 102 mg/dL — ABNORMAL HIGH (ref 70–99)
Sodium: 132 mEq/L — ABNORMAL LOW (ref 135–145)
Total Protein: 6.6 g/dL (ref 6.0–8.3)

## 2012-12-16 LAB — URINALYSIS, ROUTINE W REFLEX MICROSCOPIC
Bilirubin Urine: NEGATIVE
Hgb urine dipstick: NEGATIVE
Ketones, ur: NEGATIVE mg/dL
Specific Gravity, Urine: 1.005 (ref 1.005–1.030)
pH: 7 (ref 5.0–8.0)

## 2012-12-16 LAB — CBC
HCT: 35.2 % — ABNORMAL LOW (ref 36.0–46.0)
Hemoglobin: 12.2 g/dL (ref 12.0–15.0)
MCH: 31.3 pg (ref 26.0–34.0)
MCHC: 34.7 g/dL (ref 30.0–36.0)
RBC: 3.9 MIL/uL (ref 3.87–5.11)

## 2012-12-16 LAB — BLOOD GAS, ARTERIAL
Acid-Base Excess: 2.8 mmol/L — ABNORMAL HIGH (ref 0.0–2.0)
Bicarbonate: 26.5 mEq/L — ABNORMAL HIGH (ref 20.0–24.0)
O2 Saturation: 97.1 %
Patient temperature: 98.6
TCO2: 27.7 mmol/L (ref 0–100)
pO2, Arterial: 94.9 mmHg (ref 80.0–100.0)

## 2012-12-16 LAB — HEMOGLOBIN A1C: Hgb A1c MFr Bld: 5.5 % (ref <5.7)

## 2012-12-16 LAB — SURGICAL PCR SCREEN
MRSA, PCR: NEGATIVE
Staphylococcus aureus: POSITIVE — AB

## 2012-12-16 MED ORDER — MUPIROCIN 2 % EX OINT: TOPICAL_OINTMENT | Freq: Two times a day (BID) | CUTANEOUS | Status: DC

## 2012-12-16 NOTE — Telephone Encounter (Addendum)
Patient is to have fluids started on arrival on day of TAVR surgery(9/2).  Fluids are to start at 125cc/hr per Dr. Excell Seltzer.  Order is in epic.

## 2012-12-16 NOTE — Progress Notes (Signed)
Per Alycia Rossetti at [facility] they have contact device rep. Regarding needs DOS. Patient has inflammation to LL eyelid. On exam interior of lower eye lid had a stye. Encouraged patient to use warm compresses 3-4 times a day. If not resolved by Friday of if it gets worse requested she contact [facility]. Per Alycia Rossetti patient needs to be started on fluid at rate prescribed upon arrival to short stay.

## 2012-12-16 NOTE — Pre-Procedure Instructions (Signed)
[name]  12/16/2012   Your procedure is scheduled on:  September 2  Report to [facility] at 07:00 AM.  Call this number if you have problems the morning of surgery: [phone]   Remember:   Do not eat food or drink liquids after midnight.   Take these medicines the morning of surgery with A SIP OF WATER: Amiodarone, Carvedilol, Zyrtec, Cymbalta, Nexium,  Hydrocodone (if needed), Levothyroxine, Namenda, Lyrica, Linaclotide   STOP Biotin, Vitamin D, Multiple Vitamins, Vitamin B12 today  Do not wear jewelry, make-up or nail polish.  Do not wear lotions, powders, or perfumes. You may wear deodorant.  Do not shave 48 hours prior to surgery. Men may shave face and neck.  Do not bring valuables to the hospital.  [facility] is not responsible                   for any belongings or valuables.  Contacts, dentures or bridgework may not be worn into surgery.  Leave suitcase in the car. After surgery it may be brought to your room.  For patients admitted to the hospital, checkout time is 11:00 AM the day of  discharge.   Special Instructions: Shower using CHG 2 nights before surgery and the night before surgery.  If you shower the day of surgery use CHG.  Use special wash - you have one bottle of CHG for all showers.  You should use approximately 1/3 of the bottle for each shower.   Please read over the following fact sheets that you were given: Pain Booklet, Coughing and Deep Breathing, Blood Transfusion Information, Open Heart Packet, MRSA Information and Surgical Site Infection Prevention

## 2012-12-20 MED ORDER — DEXMEDETOMIDINE HCL IN NACL 400 MCG/100ML IV SOLN
0.1000 ug/kg/h | INTRAVENOUS | Status: AC
  Administered 2012-12-21: 0.2 ug/kg/h via INTRAVENOUS
  Filled 2012-12-20: qty 100

## 2012-12-20 MED ORDER — SODIUM CHLORIDE 0.9 % IV SOLN
INTRAVENOUS | Status: AC
  Administered 2012-12-21: 1.4 [IU]/h via INTRAVENOUS
  Filled 2012-12-20: qty 1

## 2012-12-20 MED ORDER — PHENYLEPHRINE HCL 10 MG/ML IJ SOLN
30.0000 ug/min | INTRAVENOUS | Status: DC
  Filled 2012-12-20: qty 2

## 2012-12-20 MED ORDER — POTASSIUM CHLORIDE 2 MEQ/ML IV SOLN
80.0000 meq | INTRAVENOUS | Status: DC
  Filled 2012-12-20: qty 40

## 2012-12-20 MED ORDER — NITROGLYCERIN IN D5W 200-5 MCG/ML-% IV SOLN
2.0000 ug/min | INTRAVENOUS | Status: AC
  Administered 2012-12-21: 5 ug/min via INTRAVENOUS
  Filled 2012-12-20: qty 250

## 2012-12-20 MED ORDER — EPINEPHRINE HCL 1 MG/ML IJ SOLN
0.5000 ug/min | INTRAVENOUS | Status: DC
  Filled 2012-12-20: qty 4

## 2012-12-20 MED ORDER — NOREPINEPHRINE BITARTRATE 1 MG/ML IJ SOLN
0.0000 ug/min | INTRAVENOUS | Status: DC
  Filled 2012-12-20: qty 8

## 2012-12-20 MED ORDER — DEXTROSE 5 % IV SOLN
1.5000 g | INTRAVENOUS | Status: AC
  Administered 2012-12-21: 1.5 g via INTRAVENOUS
  Filled 2012-12-20: qty 1.5

## 2012-12-20 MED ORDER — VANCOMYCIN HCL 10 G IV SOLR
1250.0000 mg | INTRAVENOUS | Status: AC
  Administered 2012-12-21: 1250 mg via INTRAVENOUS
  Filled 2012-12-20: qty 1250

## 2012-12-20 MED ORDER — SODIUM CHLORIDE 0.9 % IV SOLN
INTRAVENOUS | Status: DC
  Filled 2012-12-20: qty 30

## 2012-12-20 MED ORDER — MAGNESIUM SULFATE 50 % IJ SOLN
40.0000 meq | INTRAMUSCULAR | Status: DC
  Filled 2012-12-20: qty 10

## 2012-12-20 MED ORDER — DOPAMINE-DEXTROSE 3.2-5 MG/ML-% IV SOLN
2.0000 ug/kg/min | INTRAVENOUS | Status: DC
  Filled 2012-12-20: qty 250

## 2012-12-21 ENCOUNTER — Encounter (HOSPITAL_COMMUNITY): Payer: Self-pay | Admitting: Anesthesiology

## 2012-12-21 ENCOUNTER — Inpatient Hospital Stay (HOSPITAL_COMMUNITY)
Admission: RE | Admit: 2012-12-21 | Discharge: 2012-12-28 | DRG: 219 | Disposition: A | Discharge status: Skilled Nursing Facility | Payer: PRIVATE HEALTH INSURANCE | Source: Ambulatory Visit | Attending: Cardiovascular Disease | Admitting: Cardiovascular Disease

## 2012-12-21 ENCOUNTER — Encounter (HOSPITAL_COMMUNITY)
Admission: RE | Disposition: A | Discharge status: Skilled Nursing Facility | Payer: PRIVATE HEALTH INSURANCE | Source: Ambulatory Visit | Attending: Cardiovascular Disease

## 2012-12-21 ENCOUNTER — Encounter (HOSPITAL_COMMUNITY): Payer: Self-pay | Admitting: Certified Registered"

## 2012-12-21 ENCOUNTER — Inpatient Hospital Stay (HOSPITAL_COMMUNITY): Payer: PRIVATE HEALTH INSURANCE | Admitting: Anesthesiology

## 2012-12-21 ENCOUNTER — Inpatient Hospital Stay (HOSPITAL_COMMUNITY): Payer: PRIVATE HEALTH INSURANCE

## 2012-12-21 DIAGNOSIS — F0390 Unspecified dementia without behavioral disturbance: Secondary | ICD-10-CM | POA: Diagnosis not present

## 2012-12-21 DIAGNOSIS — I480 Paroxysmal atrial fibrillation: Secondary | ICD-10-CM

## 2012-12-21 DIAGNOSIS — D696 Thrombocytopenia, unspecified: Secondary | ICD-10-CM

## 2012-12-21 DIAGNOSIS — S92919A Unspecified fracture of unspecified toe(s), initial encounter for closed fracture: Secondary | ICD-10-CM | POA: Diagnosis present

## 2012-12-21 DIAGNOSIS — J9819 Other pulmonary collapse: Secondary | ICD-10-CM | POA: Diagnosis not present

## 2012-12-21 DIAGNOSIS — I129 Hypertensive chronic kidney disease with stage 1 through stage 4 chronic kidney disease, or unspecified chronic kidney disease: Secondary | ICD-10-CM | POA: Diagnosis present

## 2012-12-21 DIAGNOSIS — Q231 Congenital insufficiency of aortic valve: Secondary | ICD-10-CM

## 2012-12-21 DIAGNOSIS — I10 Essential (primary) hypertension: Secondary | ICD-10-CM | POA: Diagnosis present

## 2012-12-21 DIAGNOSIS — N179 Acute kidney failure, unspecified: Secondary | ICD-10-CM | POA: Diagnosis not present

## 2012-12-21 DIAGNOSIS — J9811 Atelectasis: Secondary | ICD-10-CM

## 2012-12-21 DIAGNOSIS — Z86711 Personal history of pulmonary embolism: Secondary | ICD-10-CM

## 2012-12-21 DIAGNOSIS — Z006 Encounter for examination for normal comparison and control in clinical research program: Secondary | ICD-10-CM

## 2012-12-21 DIAGNOSIS — I059 Rheumatic mitral valve disease, unspecified: Secondary | ICD-10-CM | POA: Diagnosis present

## 2012-12-21 DIAGNOSIS — Y92009 Unspecified place in unspecified non-institutional (private) residence as the place of occurrence of the external cause: Secondary | ICD-10-CM

## 2012-12-21 DIAGNOSIS — N183 Chronic kidney disease, stage 3 unspecified: Secondary | ICD-10-CM | POA: Diagnosis present

## 2012-12-21 DIAGNOSIS — E785 Hyperlipidemia, unspecified: Secondary | ICD-10-CM | POA: Diagnosis present

## 2012-12-21 DIAGNOSIS — Z8673 Personal history of transient ischemic attack (TIA), and cerebral infarction without residual deficits: Secondary | ICD-10-CM

## 2012-12-21 DIAGNOSIS — I5043 Acute on chronic combined systolic (congestive) and diastolic (congestive) heart failure: Secondary | ICD-10-CM | POA: Diagnosis present

## 2012-12-21 DIAGNOSIS — I509 Heart failure, unspecified: Secondary | ICD-10-CM | POA: Diagnosis present

## 2012-12-21 DIAGNOSIS — T8131XA Disruption of external operation (surgical) wound, not elsewhere classified, initial encounter: Secondary | ICD-10-CM | POA: Diagnosis not present

## 2012-12-21 DIAGNOSIS — D62 Acute posthemorrhagic anemia: Secondary | ICD-10-CM

## 2012-12-21 DIAGNOSIS — M545 Low back pain, unspecified: Secondary | ICD-10-CM | POA: Diagnosis present

## 2012-12-21 DIAGNOSIS — I5032 Chronic diastolic (congestive) heart failure: Secondary | ICD-10-CM

## 2012-12-21 DIAGNOSIS — Z95810 Presence of automatic (implantable) cardiac defibrillator: Secondary | ICD-10-CM

## 2012-12-21 DIAGNOSIS — Z952 Presence of prosthetic heart valve: Secondary | ICD-10-CM

## 2012-12-21 DIAGNOSIS — W19XXXA Unspecified fall, initial encounter: Secondary | ICD-10-CM | POA: Diagnosis present

## 2012-12-21 DIAGNOSIS — I4891 Unspecified atrial fibrillation: Secondary | ICD-10-CM | POA: Diagnosis present

## 2012-12-21 DIAGNOSIS — I359 Nonrheumatic aortic valve disorder, unspecified: Secondary | ICD-10-CM

## 2012-12-21 DIAGNOSIS — I428 Other cardiomyopathies: Secondary | ICD-10-CM | POA: Diagnosis present

## 2012-12-21 DIAGNOSIS — I350 Nonrheumatic aortic (valve) stenosis: Secondary | ICD-10-CM | POA: Diagnosis present

## 2012-12-21 DIAGNOSIS — Z9181 History of falling: Secondary | ICD-10-CM

## 2012-12-21 DIAGNOSIS — K219 Gastro-esophageal reflux disease without esophagitis: Secondary | ICD-10-CM | POA: Diagnosis present

## 2012-12-21 HISTORY — DX: Chronic combined systolic (congestive) and diastolic (congestive) heart failure: I50.42

## 2012-12-21 HISTORY — PX: INTRAOPERATIVE TRANSESOPHAGEAL ECHOCARDIOGRAM: SHX5062

## 2012-12-21 HISTORY — PX: CENTRAL VENOUS CATHETER INSERTION: SHX401

## 2012-12-21 HISTORY — PX: TRANSCATHETER AORTIC VALVE REPLACEMENT, TRANSFEMORAL: SHX6400

## 2012-12-21 HISTORY — DX: Fibromyalgia: M79.7

## 2012-12-21 HISTORY — DX: Cerebral infarction, unspecified: I63.9

## 2012-12-21 LAB — POCT I-STAT, CHEM 8
Chloride: 109 mEq/L (ref 96–112)
Glucose, Bld: 104 mg/dL — ABNORMAL HIGH (ref 70–99)
HCT: 31 % — ABNORMAL LOW (ref 36.0–46.0)
Potassium: 4.1 mEq/L (ref 3.5–5.1)

## 2012-12-21 LAB — POCT I-STAT 3, ART BLOOD GAS (G3+)
Acid-base deficit: 3 mmol/L — ABNORMAL HIGH (ref 0.0–2.0)
Acid-base deficit: 5 mmol/L — ABNORMAL HIGH (ref 0.0–2.0)
Bicarbonate: 23.2 mEq/L (ref 20.0–24.0)
Bicarbonate: 20.7 mEq/L (ref 20.0–24.0)
O2 Saturation: 96 %
Patient temperature: 34.8
Patient temperature: 38.3
pCO2 arterial: 47 mmHg — ABNORMAL HIGH (ref 35.0–45.0)
pO2, Arterial: 123 mmHg — ABNORMAL HIGH (ref 80.0–100.0)

## 2012-12-21 LAB — POCT I-STAT 4, (NA,K, GLUC, HGB,HCT)
Glucose, Bld: 100 mg/dL — ABNORMAL HIGH (ref 70–99)
Glucose, Bld: 115 mg/dL — ABNORMAL HIGH (ref 70–99)
HCT: 28 % — ABNORMAL LOW (ref 36.0–46.0)
Hemoglobin: 9.2 g/dL — ABNORMAL LOW (ref 12.0–15.0)
Hemoglobin: 10.2 g/dL — ABNORMAL LOW (ref 12.0–15.0)
Potassium: 3.8 mEq/L (ref 3.5–5.1)
Sodium: 143 mEq/L (ref 135–145)

## 2012-12-21 LAB — CBC
HCT: 31.4 % — ABNORMAL LOW (ref 36.0–46.0)
Hemoglobin: 10.9 g/dL — ABNORMAL LOW (ref 12.0–15.0)
MCHC: 35.4 g/dL (ref 30.0–36.0)
MCV: 89.5 fL (ref 78.0–100.0)
Platelets: 136 10*3/uL — ABNORMAL LOW (ref 150–400)
RDW: 14.2 % (ref 11.5–15.5)
RDW: 14.7 % (ref 11.5–15.5)
WBC: 3.6 10*3/uL — ABNORMAL LOW (ref 4.0–10.5)
WBC: 5.6 10*3/uL (ref 4.0–10.5)

## 2012-12-21 LAB — PROTIME-INR: INR: 1.34 (ref 0.00–1.49)

## 2012-12-21 LAB — PREPARE RBC (CROSSMATCH)

## 2012-12-21 LAB — APTT: aPTT: 43 seconds — ABNORMAL HIGH (ref 24–37)

## 2012-12-21 LAB — CREATININE, SERUM: Creatinine, Ser: 1.23 mg/dL — ABNORMAL HIGH (ref 0.50–1.10)

## 2012-12-21 LAB — MAGNESIUM: Magnesium: 1.9 mg/dL (ref 1.5–2.5)

## 2012-12-21 SURGERY — IMPLANTATION, AORTIC VALVE, TRANSCATHETER, FEMORAL APPROACH
Anesthesia: General | Site: Neck | Wound class: Clean

## 2012-12-21 MED ORDER — MORPHINE SULFATE 2 MG/ML IJ SOLN
INTRAMUSCULAR | Status: AC
  Filled 2012-12-21: qty 2

## 2012-12-21 MED ORDER — SODIUM CHLORIDE 0.9 % IJ SOLN
3.0000 mL | Freq: Two times a day (BID) | INTRAMUSCULAR | Status: DC
  Administered 2012-12-22 – 2012-12-23 (×3): 3 mL via INTRAVENOUS

## 2012-12-21 MED ORDER — ALBUMIN HUMAN 5 % IV SOLN
INTRAVENOUS | Status: DC | PRN
  Administered 2012-12-21: 12:00:00 via INTRAVENOUS

## 2012-12-21 MED ORDER — LACTATED RINGERS IV SOLN: INTRAVENOUS | Status: DC

## 2012-12-21 MED ORDER — AMIODARONE HCL 100 MG PO TABS
100.0000 mg | ORAL_TABLET | Freq: Every day | ORAL | Status: DC
  Administered 2012-12-23 – 2012-12-28 (×6): 100 mg via ORAL
  Filled 2012-12-21 (×8): qty 1

## 2012-12-21 MED ORDER — METOPROLOL TARTRATE 12.5 MG HALF TABLET
12.5000 mg | ORAL_TABLET | Freq: Two times a day (BID) | ORAL | Status: DC
  Filled 2012-12-21 (×3): qty 1

## 2012-12-21 MED ORDER — SIMVASTATIN 10 MG PO TABS
10.0000 mg | ORAL_TABLET | Freq: Every day | ORAL | Status: DC
  Administered 2012-12-22 – 2012-12-27 (×6): 10 mg via ORAL
  Filled 2012-12-21 (×9): qty 1

## 2012-12-21 MED ORDER — FENTANYL CITRATE 0.05 MG/ML IJ SOLN
INTRAMUSCULAR | Status: DC | PRN
  Administered 2012-12-21 (×6): 250 ug via INTRAVENOUS

## 2012-12-21 MED ORDER — MIDAZOLAM HCL 2 MG/2ML IJ SOLN
1.0000 mg | INTRAMUSCULAR | Status: DC | PRN
  Administered 2012-12-21: 1 mg via INTRAVENOUS
  Filled 2012-12-21: qty 2

## 2012-12-21 MED ORDER — FENTANYL CITRATE 0.05 MG/ML IJ SOLN
50.0000 ug | INTRAMUSCULAR | Status: DC | PRN
  Administered 2012-12-21: 50 ug via INTRAVENOUS
  Filled 2012-12-21: qty 2

## 2012-12-21 MED ORDER — POTASSIUM CHLORIDE 10 MEQ/50ML IV SOLN
10.0000 meq | INTRAVENOUS | Status: AC
  Administered 2012-12-21 (×3): 10 meq via INTRAVENOUS

## 2012-12-21 MED ORDER — INSULIN REGULAR BOLUS VIA INFUSION
0.0000 [IU] | Freq: Three times a day (TID) | INTRAVENOUS | Status: DC
  Filled 2012-12-21: qty 10

## 2012-12-21 MED ORDER — SODIUM CHLORIDE 0.45 % IV SOLN
INTRAVENOUS | Status: DC
  Administered 2012-12-21: 16:00:00 via INTRAVENOUS

## 2012-12-21 MED ORDER — HEPARIN SODIUM (PORCINE) 1000 UNIT/ML IJ SOLN
INTRAMUSCULAR | Status: DC | PRN
  Administered 2012-12-21: 7000 [IU] via INTRAVENOUS

## 2012-12-21 MED ORDER — CHLORHEXIDINE GLUCONATE 4 % EX LIQD: 30.0000 mL | CUTANEOUS | Status: DC

## 2012-12-21 MED ORDER — NITROGLYCERIN IN D5W 200-5 MCG/ML-% IV SOLN
0.0000 ug/min | INTRAVENOUS | Status: DC
  Filled 2012-12-21: qty 250

## 2012-12-21 MED ORDER — ALBUMIN HUMAN 5 % IV SOLN: 250.0000 mL | INTRAVENOUS | Status: AC | PRN

## 2012-12-21 MED ORDER — VANCOMYCIN HCL IN DEXTROSE 1-5 GM/200ML-% IV SOLN
1000.0000 mg | Freq: Once | INTRAVENOUS | Status: AC
  Administered 2012-12-21: 1000 mg via INTRAVENOUS
  Filled 2012-12-21: qty 200

## 2012-12-21 MED ORDER — ASPIRIN EC 325 MG PO TBEC
325.0000 mg | DELAYED_RELEASE_TABLET | Freq: Every day | ORAL | Status: DC
  Administered 2012-12-23: 325 mg via ORAL
  Filled 2012-12-21 (×3): qty 1

## 2012-12-21 MED ORDER — DOCUSATE SODIUM 100 MG PO CAPS
200.0000 mg | ORAL_CAPSULE | Freq: Every day | ORAL | Status: DC
  Administered 2012-12-23 – 2012-12-27 (×5): 200 mg via ORAL
  Filled 2012-12-21 (×6): qty 2

## 2012-12-21 MED ORDER — HEPARIN (PORCINE) IN NACL 2-0.9 UNIT/ML-% IJ SOLN
INTRAMUSCULAR | Status: DC | PRN
  Administered 2012-12-21: 500 mL via INTRAVENOUS

## 2012-12-21 MED ORDER — CEFUROXIME SODIUM 1.5 G IJ SOLR
1.5000 g | Freq: Two times a day (BID) | INTRAMUSCULAR | Status: DC
  Administered 2012-12-21 – 2012-12-22 (×2): 1.5 g via INTRAVENOUS
  Filled 2012-12-21 (×3): qty 1.5

## 2012-12-21 MED ORDER — BISACODYL 5 MG PO TBEC
10.0000 mg | DELAYED_RELEASE_TABLET | Freq: Every day | ORAL | Status: DC
  Administered 2012-12-23 – 2012-12-24 (×2): 10 mg via ORAL
  Filled 2012-12-21 (×3): qty 2

## 2012-12-21 MED ORDER — LEVOTHYROXINE SODIUM 100 MCG PO TABS
100.0000 ug | ORAL_TABLET | Freq: Every day | ORAL | Status: DC
  Administered 2012-12-23 – 2012-12-28 (×6): 100 ug via ORAL
  Filled 2012-12-21 (×8): qty 1

## 2012-12-21 MED ORDER — METOPROLOL TARTRATE 1 MG/ML IV SOLN
2.5000 mg | INTRAVENOUS | Status: DC | PRN
  Administered 2012-12-21 (×2): 2.5 mg via INTRAVENOUS

## 2012-12-21 MED ORDER — MUPIROCIN 2 % EX OINT
TOPICAL_OINTMENT | Freq: Two times a day (BID) | CUTANEOUS | Status: DC
  Administered 2012-12-21 – 2012-12-27 (×13): via TOPICAL
  Filled 2012-12-21: qty 22

## 2012-12-21 MED ORDER — MIDAZOLAM HCL 2 MG/2ML IJ SOLN
2.0000 mg | INTRAMUSCULAR | Status: DC | PRN
  Administered 2012-12-21: 2 mg via INTRAVENOUS
  Filled 2012-12-21: qty 2

## 2012-12-21 MED ORDER — LACTATED RINGERS IV SOLN: 500.0000 mL | Freq: Once | INTRAVENOUS | Status: AC | PRN

## 2012-12-21 MED ORDER — SODIUM CHLORIDE 0.9 % IV SOLN
INTRAVENOUS | Status: DC
  Filled 2012-12-21: qty 1

## 2012-12-21 MED ORDER — SODIUM CHLORIDE 0.9 % IR SOLN
Status: DC | PRN
  Administered 2012-12-21 (×2)

## 2012-12-21 MED ORDER — DULOXETINE HCL 60 MG PO CPEP
60.0000 mg | ORAL_CAPSULE | Freq: Every day | ORAL | Status: DC
  Administered 2012-12-23 – 2012-12-28 (×6): 60 mg via ORAL
  Filled 2012-12-21 (×8): qty 1

## 2012-12-21 MED ORDER — HYDRALAZINE HCL 20 MG/ML IJ SOLN
10.0000 mg | INTRAMUSCULAR | Status: DC | PRN
  Administered 2012-12-21: 10 mg via INTRAVENOUS

## 2012-12-21 MED ORDER — ASPIRIN 81 MG PO CHEW: 324.0000 mg | CHEWABLE_TABLET | Freq: Every day | ORAL | Status: DC

## 2012-12-21 MED ORDER — OXYCODONE HCL 5 MG PO TABS
5.0000 mg | ORAL_TABLET | ORAL | Status: DC | PRN
  Administered 2012-12-22 – 2012-12-24 (×8): 10 mg via ORAL
  Filled 2012-12-21 (×8): qty 2

## 2012-12-21 MED ORDER — 0.9 % SODIUM CHLORIDE (POUR BTL) OPTIME
TOPICAL | Status: DC | PRN
  Administered 2012-12-21: 5000 mL

## 2012-12-21 MED ORDER — SODIUM CHLORIDE 0.9 % IV SOLN
INTRAVENOUS | Status: AC
  Administered 2012-12-21: 18:00:00 via INTRAVENOUS

## 2012-12-21 MED ORDER — IODIXANOL 320 MG/ML IV SOLN
INTRAVENOUS | Status: DC | PRN
  Administered 2012-12-21: 45.3 mL via INTRA_ARTERIAL

## 2012-12-21 MED ORDER — DONEPEZIL HCL 10 MG PO TABS
10.0000 mg | ORAL_TABLET | Freq: Every day | ORAL | Status: DC
  Administered 2012-12-21 – 2012-12-27 (×7): 10 mg via ORAL
  Filled 2012-12-21 (×8): qty 1

## 2012-12-21 MED ORDER — METOPROLOL TARTRATE 25 MG/10 ML ORAL SUSPENSION
12.5000 mg | Freq: Two times a day (BID) | ORAL | Status: DC
  Administered 2012-12-21: 12.5 mg
  Filled 2012-12-21 (×3): qty 5

## 2012-12-21 MED ORDER — HYDRALAZINE HCL 20 MG/ML IJ SOLN
INTRAMUSCULAR | Status: AC
  Filled 2012-12-21: qty 1

## 2012-12-21 MED ORDER — PANTOPRAZOLE SODIUM 40 MG PO TBEC
40.0000 mg | DELAYED_RELEASE_TABLET | Freq: Every day | ORAL | Status: DC
  Administered 2012-12-23 – 2012-12-28 (×6): 40 mg via ORAL
  Filled 2012-12-21 (×6): qty 1

## 2012-12-21 MED ORDER — MIDAZOLAM HCL 5 MG/5ML IJ SOLN
INTRAMUSCULAR | Status: DC | PRN
  Administered 2012-12-21: 2 mg via INTRAVENOUS

## 2012-12-21 MED ORDER — ONDANSETRON HCL 4 MG/2ML IJ SOLN: 4.0000 mg | Freq: Four times a day (QID) | INTRAMUSCULAR | Status: DC | PRN

## 2012-12-21 MED ORDER — PHENYLEPHRINE HCL 10 MG/ML IJ SOLN
0.0000 ug/min | INTRAVENOUS | Status: DC
  Filled 2012-12-21: qty 2

## 2012-12-21 MED ORDER — MORPHINE SULFATE 2 MG/ML IJ SOLN
1.0000 mg | INTRAMUSCULAR | Status: AC | PRN
  Administered 2012-12-21 (×3): 2 mg via INTRAVENOUS
  Filled 2012-12-21 (×2): qty 1

## 2012-12-21 MED ORDER — HEPARIN (PORCINE) IN NACL 2-0.9 UNIT/ML-% IJ SOLN: INTRAMUSCULAR | Status: DC | PRN

## 2012-12-21 MED ORDER — FAMOTIDINE IN NACL 20-0.9 MG/50ML-% IV SOLN
20.0000 mg | Freq: Two times a day (BID) | INTRAVENOUS | Status: AC
  Administered 2012-12-21 – 2012-12-22 (×2): 20 mg via INTRAVENOUS
  Filled 2012-12-21: qty 50

## 2012-12-21 MED ORDER — DEXMEDETOMIDINE HCL IN NACL 200 MCG/50ML IV SOLN
0.1000 ug/kg/h | INTRAVENOUS | Status: DC
Start: 2012-12-21 — End: 2012-12-22

## 2012-12-21 MED ORDER — FEBUXOSTAT 40 MG PO TABS
40.0000 mg | ORAL_TABLET | Freq: Every day | ORAL | Status: DC
  Administered 2012-12-23 – 2012-12-28 (×6): 40 mg via ORAL
  Filled 2012-12-21 (×8): qty 1

## 2012-12-21 MED ORDER — ROCURONIUM BROMIDE 100 MG/10ML IV SOLN
INTRAVENOUS | Status: DC | PRN
  Administered 2012-12-21: 50 mg via INTRAVENOUS
  Administered 2012-12-21: 20 mg via INTRAVENOUS
  Administered 2012-12-21: 30 mg via INTRAVENOUS

## 2012-12-21 MED ORDER — LABETALOL HCL 5 MG/ML IV SOLN
20.0000 mg | INTRAVENOUS | Status: DC | PRN
  Filled 2012-12-21: qty 4

## 2012-12-21 MED ORDER — SODIUM CHLORIDE 0.9 % IV SOLN
INTRAVENOUS | Status: DC
  Administered 2012-12-21 (×2): via INTRAVENOUS

## 2012-12-21 MED ORDER — MEMANTINE HCL 10 MG PO TABS
10.0000 mg | ORAL_TABLET | Freq: Two times a day (BID) | ORAL | Status: DC
  Administered 2012-12-21 – 2012-12-28 (×13): 10 mg via ORAL
  Filled 2012-12-21 (×15): qty 1

## 2012-12-21 MED ORDER — 0.9 % SODIUM CHLORIDE (POUR BTL) OPTIME
TOPICAL | Status: DC | PRN
  Administered 2012-12-21: 1000 mL

## 2012-12-21 MED ORDER — LORATADINE 10 MG PO TABS
10.0000 mg | ORAL_TABLET | Freq: Every day | ORAL | Status: DC
  Administered 2012-12-23 – 2012-12-28 (×6): 10 mg via ORAL
  Filled 2012-12-21 (×8): qty 1

## 2012-12-21 MED ORDER — SODIUM CHLORIDE 0.9 % IJ SOLN: 3.0000 mL | INTRAMUSCULAR | Status: DC | PRN

## 2012-12-21 MED ORDER — DEXTROSE 50 % IV SOLN
INTRAVENOUS | Status: AC
  Administered 2012-12-21: 14 mL
  Filled 2012-12-21: qty 50

## 2012-12-21 MED ORDER — PROPOFOL 10 MG/ML IV BOLUS
INTRAVENOUS | Status: DC | PRN
  Administered 2012-12-21: 20 mg via INTRAVENOUS

## 2012-12-21 MED ORDER — PREGABALIN 50 MG PO CAPS
75.0000 mg | ORAL_CAPSULE | Freq: Two times a day (BID) | ORAL | Status: DC
  Administered 2012-12-21 – 2012-12-28 (×13): 75 mg via ORAL
  Filled 2012-12-21 (×15): qty 1

## 2012-12-21 MED ORDER — MAGNESIUM SULFATE 40 MG/ML IJ SOLN
4.0000 g | Freq: Once | INTRAMUSCULAR | Status: DC
  Filled 2012-12-21: qty 100

## 2012-12-21 MED ORDER — BISACODYL 10 MG RE SUPP: 10.0000 mg | Freq: Every day | RECTAL | Status: DC

## 2012-12-21 MED ORDER — LABETALOL HCL 5 MG/ML IV SOLN
INTRAVENOUS | Status: AC
  Filled 2012-12-21: qty 4

## 2012-12-21 MED ORDER — LACTATED RINGERS IV SOLN
INTRAVENOUS | Status: DC
  Administered 2012-12-21: 08:00:00 via INTRAVENOUS

## 2012-12-21 MED ORDER — NITROGLYCERIN 0.2 MG/ML ON CALL CATH LAB
INTRAVENOUS | Status: DC | PRN
  Administered 2012-12-21 (×5): 40 ug via INTRAVENOUS

## 2012-12-21 MED ORDER — INSULIN ASPART 100 UNIT/ML ~~LOC~~ SOLN
0.0000 [IU] | SUBCUTANEOUS | Status: DC
  Administered 2012-12-22 – 2012-12-23 (×2): 2 [IU] via SUBCUTANEOUS

## 2012-12-21 MED ORDER — PROTAMINE SULFATE 10 MG/ML IV SOLN
INTRAVENOUS | Status: DC | PRN
  Administered 2012-12-21: 70 mg via INTRAVENOUS

## 2012-12-21 MED ORDER — SODIUM CHLORIDE 0.9 % IV SOLN: 250.0000 mL | INTRAVENOUS | Status: DC

## 2012-12-21 MED ORDER — COLCHICINE 0.6 MG PO TABS
0.6000 mg | ORAL_TABLET | ORAL | Status: DC
  Administered 2012-12-23 – 2012-12-27 (×3): 0.6 mg via ORAL
  Filled 2012-12-21 (×4): qty 1

## 2012-12-21 MED ORDER — SODIUM CHLORIDE 0.9 % IV SOLN
INTRAVENOUS | Status: DC
  Administered 2012-12-21: 16:00:00 via INTRAVENOUS

## 2012-12-21 SURGICAL SUPPLY — 122 items
ADAPTER CARDIOPLEGIA (MISCELLANEOUS) IMPLANT
ADH SKN CLS APL DERMABOND .7 (GAUZE/BANDAGES/DRESSINGS) ×2
ANTEGRADE CPLG (MISCELLANEOUS) IMPLANT
ARROW BLUE PLUS MULTI-LUMEN CVC ×1 IMPLANT
ATTRACTOMAT 16X20 MAGNETIC DRP (DRAPES) IMPLANT
BAG BANDED W/RUBBER/TAPE 36X54 (MISCELLANEOUS) ×3 IMPLANT
BAG DECANTER FOR FLEXI CONT (MISCELLANEOUS) ×1 IMPLANT
BAG EQP BAND 135X91 W/RBR TAPE (MISCELLANEOUS) ×2
BAG SNAP BAND KOVER 36X36 (MISCELLANEOUS) ×4 IMPLANT
BLADE STERNUM SYSTEM 6 (BLADE) ×3 IMPLANT
BLADE SURG ROTATE 9660 (MISCELLANEOUS) IMPLANT
CABLE PACING FASLOC BIEGE (MISCELLANEOUS) ×4 IMPLANT
CABLE PACING FASLOC BLUE (MISCELLANEOUS) ×3 IMPLANT
CANISTER SUCTION 2500CC (MISCELLANEOUS) IMPLANT
CANNULA FEM VENOUS REMOTE 22FR (CANNULA) IMPLANT
CANNULA FEMORAL ART 14 SM (MISCELLANEOUS) IMPLANT
CANNULA GUNDRY RCSP 15FR (MISCELLANEOUS) IMPLANT
CANNULA OPTISITE PERFUSION 16F (CANNULA) IMPLANT
CANNULA OPTISITE PERFUSION 18F (CANNULA) IMPLANT
CANNULA SOFTFLOW AORTIC 7M21FR (CANNULA) IMPLANT
CANNULA VENOUS LOW PROF 34X46 (CANNULA) IMPLANT
CATH DIAG EXPO 6F VENT PIG 145 (CATHETERS) ×2 IMPLANT
CATH HEART VENT LEFT (CATHETERS) IMPLANT
CATH S G BIP PACING (SET/KITS/TRAYS/PACK) ×3 IMPLANT
CATH SOFT-VU 4F 65 STRAIGHT (CATHETERS) IMPLANT
CATH SOFT-VU STRAIGHT 4F 65CM (CATHETERS) ×3
CLIP TI MEDIUM 24 (CLIP) ×1 IMPLANT
CLIP TI WIDE RED SMALL 24 (CLIP) ×1 IMPLANT
CLOTH BEACON ORANGE TIMEOUT ST (SAFETY) ×3 IMPLANT
CONN ST 1/4X3/8  BEN (MISCELLANEOUS) ×1
CONN ST 1/4X3/8 BEN (MISCELLANEOUS) IMPLANT
CONNECTOR 1/2X3/8X1/2 3 WAY (MISCELLANEOUS) ×1
CONNECTOR 1/2X3/8X1/2 3WAY (MISCELLANEOUS) IMPLANT
COVER DOME SNAP 22 D (MISCELLANEOUS) ×4 IMPLANT
COVER MAYO STAND STRL (DRAPES) ×3 IMPLANT
COVER PROBE W GEL 5X96 (DRAPES) IMPLANT
COVER SURGICAL LIGHT HANDLE (MISCELLANEOUS) ×3 IMPLANT
COVER TABLE BACK 60X90 (DRAPES) ×3 IMPLANT
CRADLE DONUT ADULT HEAD (MISCELLANEOUS) ×3 IMPLANT
DERMABOND ADVANCED (GAUZE/BANDAGES/DRESSINGS) ×1
DERMABOND ADVANCED .7 DNX12 (GAUZE/BANDAGES/DRESSINGS) ×2 IMPLANT
DRAIN CHANNEL 28F RND 3/8 FF (WOUND CARE) IMPLANT
DRAIN CHANNEL 32F RND 10.7 FF (WOUND CARE) IMPLANT
DRAPE INCISE IOBAN 66X45 STRL (DRAPES) IMPLANT
DRAPE SLUSH/WARMER DISC (DRAPES) ×3 IMPLANT
DRAPE TABLE COVER HEAVY DUTY (DRAPES) ×3 IMPLANT
DRSG TEGADERM 2-3/8X2-3/4 SM (GAUZE/BANDAGES/DRESSINGS) ×1 IMPLANT
DRSG TEGADERM 4X4.75 (GAUZE/BANDAGES/DRESSINGS) ×1 IMPLANT
ELECT REM PT RETURN 9FT ADLT (ELECTROSURGICAL) ×6
ELECTRODE REM PT RTRN 9FT ADLT (ELECTROSURGICAL) ×4 IMPLANT
EXPO 5F AMPLATZ LEFT 1 ×1 IMPLANT
FELT TEFLON 6X6 (MISCELLANEOUS) ×3 IMPLANT
FEMORAL VENOUS CANN RAP (CANNULA) IMPLANT
GLOVE BIO SURGEON STRL SZ 6 (GLOVE) ×2 IMPLANT
GLOVE BIOGEL PI IND STRL 7.0 (GLOVE) IMPLANT
GLOVE BIOGEL PI INDICATOR 7.0 (GLOVE) ×1
GLOVE ECLIPSE 6.5 STRL STRAW (GLOVE) ×1 IMPLANT
GLOVE ECLIPSE 7.5 STRL STRAW (GLOVE) ×3 IMPLANT
GLOVE ECLIPSE 8.0 STRL XLNG CF (GLOVE) ×6 IMPLANT
GLOVE EUDERMIC 7 POWDERFREE (GLOVE) ×3 IMPLANT
GLOVE ORTHO TXT STRL SZ7.5 (GLOVE) ×3 IMPLANT
GOWN PREVENTION PLUS XLARGE (GOWN DISPOSABLE) ×15 IMPLANT
GOWN STRL NON-REIN LRG LVL3 (GOWN DISPOSABLE) ×10 IMPLANT
GUIDEWIRE SAF TJ AMPL .035X180 (WIRE) ×4 IMPLANT
GUIDEWIRE SAFE TJ AMPLATZ EXST (WIRE) ×1 IMPLANT
HEMOSTAT POWDER SURGIFOAM 1G (HEMOSTASIS) IMPLANT
INSERT FOGARTY 61MM (MISCELLANEOUS) IMPLANT
INSERT FOGARTY XLG (MISCELLANEOUS) IMPLANT
KIT BASIN OR (CUSTOM PROCEDURE TRAY) ×3 IMPLANT
KIT DILATOR VASC 18G NDL (KITS) IMPLANT
KIT ROOM TURNOVER OR (KITS) ×3 IMPLANT
KIT SUCTION CATH 14FR (SUCTIONS) ×6 IMPLANT
LEAD PACING MYOCARDI (MISCELLANEOUS) IMPLANT
MEDTRONIC EXCHANGE GUIDE WIRE ×1 IMPLANT
NAMIC CONVENIENCE KIT ×2 IMPLANT
NDL PERC 18GX7CM (NEEDLE) ×2 IMPLANT
NEEDLE PERC 18GX7CM (NEEDLE) ×3 IMPLANT
NS IRRIG 1000ML POUR BTL (IV SOLUTION) ×11 IMPLANT
PACK AORTA (CUSTOM PROCEDURE TRAY) ×3 IMPLANT
PAD ARMBOARD 7.5X6 YLW CONV (MISCELLANEOUS) ×6 IMPLANT
PAD ELECT DEFIB RADIOL ZOLL (MISCELLANEOUS) ×3 IMPLANT
PATCH TACHOSII LRG 9.5X4.8 (VASCULAR PRODUCTS) IMPLANT
SET CANNULATION TOURNIQUET (MISCELLANEOUS) ×1 IMPLANT
SHEATH PINNACLE 6F 10CM (SHEATH) ×2 IMPLANT
SLEEVE SURGEON STRL (DRAPES) ×1 IMPLANT
SPONGE GAUZE 4X4 12PLY (GAUZE/BANDAGES/DRESSINGS) ×3 IMPLANT
SPONGE LAP 4X18 X RAY DECT (DISPOSABLE) ×3 IMPLANT
STOPCOCK MORSE 400PSI 3WAY (MISCELLANEOUS) ×3 IMPLANT
SUT ETHIBOND 2 0 SH (SUTURE) ×6
SUT ETHIBOND 2 0 SH 36X2 (SUTURE) ×2 IMPLANT
SUT ETHIBOND X763 2 0 SH 1 (SUTURE) ×6 IMPLANT
SUT MNCRL AB 3-0 PS2 18 (SUTURE) ×3 IMPLANT
SUT PDS AB 1 CTX 36 (SUTURE) IMPLANT
SUT PROLENE 2 0 MH 48 (SUTURE) IMPLANT
SUT PROLENE 3 0 SH1 36 (SUTURE) ×1 IMPLANT
SUT PROLENE 4 0 RB 1 (SUTURE) ×3
SUT PROLENE 4-0 RB1 .5 CRCL 36 (SUTURE) IMPLANT
SUT PROLENE 5 0 C 1 36 (SUTURE) ×9 IMPLANT
SUT PROLENE 6 0 C 1 30 (SUTURE) ×8 IMPLANT
SUT SILK  1 MH (SUTURE) ×2
SUT SILK 1 MH (SUTURE) ×2 IMPLANT
SUT SILK 2 0 SH CR/8 (SUTURE) ×3 IMPLANT
SUT TEM PAC WIRE 2 0 SH (SUTURE) IMPLANT
SUT VIC AB 2-0 CTX 36 (SUTURE) IMPLANT
SUT VIC AB 3-0 SH 8-18 (SUTURE) ×6 IMPLANT
SYR 30ML LL (SYRINGE) ×6 IMPLANT
SYR 50ML LL SCALE MARK (SYRINGE) ×3 IMPLANT
SYR MEDRAD MARK V 150ML (SYRINGE) ×1 IMPLANT
SYSTEM SAHARA CHEST DRAIN ATS (WOUND CARE) ×3 IMPLANT
TAPE CLOTH SURG 4X10 WHT LF (GAUZE/BANDAGES/DRESSINGS) ×1 IMPLANT
TOWEL OR 17X24 6PK STRL BLUE (TOWEL DISPOSABLE) ×9 IMPLANT
TOWEL OR 17X26 10 PK STRL BLUE (TOWEL DISPOSABLE) ×6 IMPLANT
TRANSDUCER W/STOPCOCK (MISCELLANEOUS) ×2 IMPLANT
TRAY FOLEY IC TEMP SENS 16FR (CATHETERS) ×1 IMPLANT
TUBE SUCT INTRACARD DLP 20F (MISCELLANEOUS) IMPLANT
TUBING HIGH PRESSURE 120CM (CONNECTOR) ×3 IMPLANT
UNDERPAD 30X30 INCONTINENT (UNDERPADS AND DIAPERS) ×3 IMPLANT
VALVE HRT TRANSCATH NOVAFL 29M (Valve) ×1 IMPLANT
VENT LEFT HEART 12002 (CATHETERS)
WATER STERILE IRR 1000ML POUR (IV SOLUTION) ×6 IMPLANT
WIRE .035 3MM-J 145CM (WIRE) ×1 IMPLANT
WIRE AMPLATZ SS-J .035X180CM (WIRE) ×1 IMPLANT

## 2012-12-21 NOTE — Procedures (Signed)
Extubation Procedure Note  Patient Details:   Name: [name] DOB: January 14, 1933 MRN: [id number]   Airway Documentation:  Airway 8 mm (Active)  Secured at (cm) 20 cm 12/21/2012 10:02 PM  Measured From Lips 12/21/2012 10:02 PM  Secured Location Left 12/21/2012 10:02 PM  Secured By Pink Tape 12/21/2012 10:02 PM  Site Condition Dry 12/21/2012 10:02 PM    Evaluation  O2 sats: stable throughout Complications: No apparent complications Patient did tolerate procedure well. Bilateral Breath Sounds: Clear   Yes  Augustine Radar 12/21/2012, 10:37 PM

## 2012-12-21 NOTE — Progress Notes (Addendum)
Paged Dr. Excell Seltzer about  patient's A-Line having a significant whip in the waveform.   Per MD, discard A-line and use Cuff BP.  Will slowly wean nitro drip as patient tolerates.  Informed MD that cuff BP is significantly lower than A-Line,  Made MD aware of patient's BP.  Will continue to monitor patient.

## 2012-12-21 NOTE — Progress Notes (Signed)
Patient was extubated, patient does have a non-productive hacking cough, lungs are clear, patient is moving air in all lung fields.  Patient states she had this cough before surgery.  She also stated she fell on Sunday at home and hit her tailbone.

## 2012-12-21 NOTE — Progress Notes (Signed)
Dr Donata Clay informed at bedside of radiologist report of [name] being coiled and needing to be pulled back.  He pulled [name] from approximately 62 to 54 cm and tested with wedge pressure at bedside with RN.

## 2012-12-21 NOTE — Progress Notes (Signed)
  Echocardiogram Echocardiogram Transesophageal (OR) has been performed.  [name], [facility] 12/21/2012, 3:06 PM

## 2012-12-21 NOTE — Progress Notes (Signed)
Hypoglycemic Event  CBG: 66   1730  Treatment: 14 ml D50 per glucostabilizer  Symptoms: UTA sedated on vent  Follow-up CBG: Time:1745 CBG Result:110  Possible Reasons for Event: insulin gtt Comments/MD notified:MD notified    Brock Ra  Remember to initiate Hypoglycemia Order Set & complete

## 2012-12-21 NOTE — Preoperative (Signed)
Beta Blockers   Reason not to administer Beta Blockers:Not Applicable 

## 2012-12-21 NOTE — H&P (Signed)
Patient ID:  [name]  MRN: [id number]  DOB/AGE: 27-Apr-1932 79 y.o.   Primary Care Physician:KIM, Fayrene Fearing, MD   Primary Cardiologist: Dr Ladona Ridgel   HPI: 79 year old woman presenting with severe low-gradient aortic stenosis presenting for scheduled transcatheter aortic valve replacement. The patient has undergone extensive evaluation with studies as outlined below. A 2-D echocardiogram has demonstrated findings consistent with moderate to severe aortic stenosis in the setting of a severe cardiomyopathy with left ventricular ejection fraction of 35%. A dobutamine stress echocardiogram was performed and demonstrated findings consistent with severe aortic stenosis with a valve area of 0.7 cm. Cardiac catheterization demonstrated minor nonobstructive coronary artery disease. The patient underwent a cardiac gated CT which indicated a type I bicuspid aortic valve with fusion of the left and right coronary cusps. The morphology of the aortic valve annulus had a normal appearance. Her pelvic anatomy based on findings of CT angiography demonstrated suitable access for transcatheter aortic valve replacement.   The patient has developed progressive and severe shortness of breath with minimal activity. She has mild shortness of breath at rest. She's also had chest discomfort with low-level exertion. This led to her evaluation as outlined above. She denies orthopnea, PND, or leg swelling. She's had no lightheadedness or syncope. She underwent physical therapy assessment on July 31 demonstrating moderate frailty, marked limitation with inability to complete any of the functional/balance testing. She had marked shortness of breath with just 10 feet of walking. She presents today for further discussion of her treatment options for her severe aortic stenosis.   Past Medical History   Diagnosis  Date   .  Syncope    .  Symptomatic bradycardia    .  Acute on chronic renal failure    .  Chronic lower back pain    .   Nonischemic cardiomyopathy    .  HTN (hypertension)    .  HLD (hyperlipidemia)    .  Atrial fibrillation      tachy-brady syndrome with <1% recurrent PAF since pacemaker placement   .  CKD (chronic kidney disease)    .  H/O: stroke    .  Pulmonary embolism      HISTORY OF, the pt. had a recurrent bilateral pulmonary emboli in 2005, on warfarin therapy and at which time she under went implantation of IVC filter   .  Anemia    .  Dementia    .  Gastroesophageal reflux disease    .  Aortic stenosis  10/13/2012     Low EF, low gradient with severe aortic stenosis confirmed by dobutamine stress echocardiogram    Past Surgical History   Procedure  Laterality  Date   .  Cardiac defibrillator placement   06/26/10     medtronic   No family history on file.  History    Social History   .  Marital Status:  Divorced     Spouse Name:  N/A     Number of Children:  N/A   .  Years of Education:  N/A    Occupational History   .  Not on file.    Social History Main Topics   .  Smoking status:  Never Smoker   .  Smokeless tobacco:  Not on file   .  Alcohol Use:  Not on file   .  Drug Use:  Not on file   .  Sexual Activity:  Not on file    Other Topics  Concern   .  Not on file    Social History Narrative   .  No narrative on file    Allergies   Allergen  Reactions   .  Codeine  Hives   .  Darvocet [Propoxyphene-Acetaminophen]  Hives   .  Darvon  Hives   .  Nubain [Nalbuphine Hcl]  Hives    ROS:  General: no fevers/chills/night sweats  Eyes: no blurry vision, diplopia, or amaurosis  ENT: no sore throat . Positive for hearing loss  Resp: no cough, wheezing, or hemoptysis  CV: no edema or palpitations  GI: no abdominal pain, nausea, vomiting, diarrhea, or constipation. Positive for acid reflux  GU: no dysuria or hematuria. Positive for frequent urination and difficulty emptying.  Skin: no rash  Neuro: no headache, numbness, tingling, or weakness of extremities  Musculoskeletal:  Positive for leg pain with ambulation. The patient ambulates with a cane. Right knee and right hip pain.  Heme: no bleeding, DVT. Positive for easy bruising. Positive for IVC filter  Endo: no polydipsia or polyuria   BP 154/69  Pulse 77  Resp 21  Ht 4\' 10"  (1.473 m)  Wt 157 lb (71.215 kg)  BMI 32.82 kg/m2  SpO2 99%  PHYSICAL EXAM:  Pt is alert and oriented, pleasant elderly woman in no distress  HEENT: normal  Neck: JVP normal. Carotid upstrokes delayed without bruits No thyromegaly.  Lungs: equal expansion, clear bilaterally  CV: Apex is discrete and nondisplaced, RRR with a grade 3/6 harsh crescendo decrescendo murmur with absent aortic closure sound  Abd: soft, NT, +BS, no bruit, no hepatosplenomegaly  Back: no CVA tenderness  Ext: no C/C/E  Femoral pulses 2+= without bruits  DP/PT pulses intact and =  Skin: warm and dry without rash  Neuro: CNII-XII intact  Strength intact = bilaterally   2D ECHO:  Study Conclusions  - Left ventricle: Hypokinesis worse in inferior wall The cavity size was normal. Wall thickness was increased in a pattern of mild LVH. The estimated ejection fraction was 35%. Diffuse hypokinesis. Hypokinesis of the entire myocardium. Doppler parameters are consistent with abnormal left ventricular relaxation (grade 1 diastolic dysfunction). - Aortic valve: There was moderate to severe stenosis. Mild regurgitation. - Mitral valve: Mild regurgitation. - Left atrium: The atrium was mildly dilated. - Right ventricle: Systolic function was reduced. - Atrial septum: No defect or patent foramen ovale was identified. - Pericardium, extracardiac: There was a left pleural effusion. - Impressions: Degree of AS difficult to judge due to significant LV dysfunction. Dobutamine echo may be worth while to assess contractile reserve and change in AVA with inotropes Impressions:  - Degree of AS difficult to judge due to significant LV dysfunction. Dobutamine echo  may be worth while to assess contractile reserve and change in AVA with inotropes  Dobutamine echocardiogram 10/26/2012:  Study Conclusions  - Aortic valve: Valve area: 0.76cm^2(VTI). Valve area: 0.83cm^2 (Vmax). - Stress ECG conclusions: The stress ECG was normal. Impressions:  - Dobutamine echocardiogram to assess severity of aortic stenosis in patient with severe LV dysfunction. Rest images reveal EF 20-25; peak velocity of 3.4 m/s, mean gradient of 26 mmHg and AVA 0.9 cm2.  Dobutamine 5ug - LV function unchanged.  Dobutamine 10 ug - some augmentation in LV function; peak velocity 3.9 m/s, mean gradient 38 mmHg and AVA 0.7 cm2.  Dobutamine 20 ug - LV function continues with some augmentation; peak velocity 4 m/s, mean gradient 38 mmHg, AVA 0.7 cm2.  Findings suggest true, severe aortic stenosis and not  pseudostenosis.  CARDIAC CATH:  Procedure: Right Heart Cath, Left Heart Cath, Selective Coronary Angiography, aortic root angiography  Indication: Aortic stenosis and left ventricular dysfunction  Procedural Details: The right groin was prepped, draped, and anesthetized with 1% lidocaine. Using the modified Seldinger technique a 5 French sheath was placed in the right femoral artery and a 6 French sheath was placed in the right femoral vein. A multipurpose catheter was used for the right heart catheterization. Caution was taken when crossing the patient's IVC filter. This was crossed directly under fluoroscopic visualization. Standard protocol was followed for recording of right heart pressures and sampling of oxygen saturations. Fick cardiac output was calculated. Standard Judkins catheters were used for selective coronary angiography and left ventriculography. There were no immediate procedural complications. The patient was transferred to the post catheterization recovery area for further monitoring.  Procedural Findings:  Hemodynamics  RA mean of 3  RV 31/5  PA 31/10 with a  mean of 19  PCWP 7, A wave 9, V wave 9  LV 188/20  AO 172/62 with a mean of 100  Oxygen saturations:  PA 59  AO 94  Cardiac Output (Fick) 4  Cardiac Index (Fick) 2.37  Aortic valve hemodynamics: Peak to peak gradient 17, mean gradient 11, aortic valve area 1.2 cm   Coronary angiography:  Coronary dominance: right  Left mainstem: The left main is very short. There is mild calcification. The left main arises from the left cusp it divides into the LAD and left circumflex.  Left anterior descending (LAD): The LAD is patent to the apex. The first diagonal is patent. There is minor irregularity in the LAD and diagonal but there are no significant stenoses present.  Left circumflex (LCx): The left circumflex is large in caliber. There is a tiny first OM and large second OM that divides into 2 branch vessels. There is no significant stenosis in the left circumflex.  Right coronary artery (RCA): The right coronary artery has an inferiorly directed origin. There is calcification in the right cusp. There is minor irregularity in the proximal RCA with a widely patent lumen. There is diffuse minor irregularity throughout the course of the RCA. The PDA and PLA branches are patent.  Left ventriculography: Deferred because of chronic kidney disease. LVEF by echo is 35%.  Aortic root angiography: The aortic valve is moderately to severely calcified. There is mild aortic insufficiency. The a standing aorta does not appear to be heavily calcified.   Final Conclusions:  1. Patent coronary arteries with mild nonobstructive disease present.  2. Moderate aortic stenosis, question low gradient aortic stenosis  3. Known moderately severe left ventricular systolic dysfunction.  4. Essentially normal right heart hemodynamics  Recommendations: The patient's aortic valve is calcified and at least moderately difficult to cross with a straight wire. I am suspicious that she has low gradient severe aortic stenosis. She  does not have significant coronary artery disease. Recommend dobutamine stress echocardiography to assess for contractile reserve and significance of aortic stenosis.  Tonny Bollman   Cardiac gated CTA:  AORTIC ROOT FINDINGS AND MEASUREMENTS PERTINENT TO POTENTIAL TAVR:  Aortic Valve Description: The aortic valve is markedly thickened  and heavily calcified. This valve appears to represent a type 1  bicuspid aortic valve (separate noncoronary cusp, and fused left  and right cusp moieties with an intervening median raphe). During  systole, there is limited opening of the valve, with an estimated  cross-sectional area of the approximately 0.85 cm2 by planimetry.  During diastole there is a tiny central region of malcoaptation  (approximately 2 mm squared), suggesting trace aortic  insufficiency.  Annulus:  Planimetry - 4.96 cm2 (calculated diameter 25.1 mm)  Long & Short Axis - 27.2 x 25.0 mm (calculated diameter 26.1  mm)  Circumference - 82.0 mm (calculated diameter 26.1 mm)  Sinuses of Valsalva:  R SOV - width 32.2 mm  height 25.0 mm  L SOV - width 33.4 mm  height 16.2 mm   SOV - width 33.5 mm  height 25.2 mm  Coronary Artery [place]:  L main - 12.4 mm, 14.3 mm and 15.8 mm from the annulus to the  inferior,  mid and superior aspect of the left main coronary  artery ostium.  RCA - 14.6 mm from the annulus  Ascending Aorta:  35.0 x 37.7 mm in diameter 4 cm above the annulus.  OTHER AORTA AND PULMONARY MEASUREMENTS:  Descending aorta: (< 40 mm): 26 mm  Main pulmonary artery: (< 30 mm): 25 mm  OTHER FINDINGS:  Patchy linear opacities throughout the lung parenchyma are again  noted, most compatible with areas of mild chronic scarring. No  acute consolidative airspace disease or suspicious appearing  pulmonary nodules are noted within the visualized thorax. No  pleural effusions. Visualized portions of the upper abdomen are  unremarkable. Pacemaker leads terminate in the right  atrial  appendage and right ventricular apex.  IMPRESSION:  1. Findings and measurements pertinent to potential TAVR  procedure, as above. Notably, the aortic valve appears to be a  type 1 bicuspid aortic valve, with fusion of the left and right  cusp moieties with an intervening median raphe.  2. The aortic valve is severe sclerotic, with estimated aortic  valve area approximately 0.85 cm2 by planimetry. There is also  evidence to suggest trace aortic insufficiency.  3. Calculated annular diameter of 25-26 mm, as above.  4. Limited evaluation of the coronary arteries secondary to the  rapid and irregular rhythm at the time of image acquisition. With  these limitations in mind, there appears to be only mild  nonobstructive multivessel coronary artery disease.   CTA Chest/Abdomen/Pelvis:  Findings:  Mediastinum: Heart size is enlarged. There is no significant  pericardial fluid, thickening or pericardial calcification. Right-  sided pacemaker device in place with lead tips terminating in the  right atrial appendage and right ventricular apex. There is  atherosclerosis of the thoracic aorta, the great vessels of the  mediastinum and the coronary arteries, including calcified  atherosclerotic plaque in the left anterior descending, left  circumflex and right coronary arteries. No pathologically enlarged  mediastinal or hilar lymph nodes. Esophagus is unremarkable in  appearance. Thickening and calcification of the aortic valve.  Subclavian arteries measuring 9 mm and 7 mm in diameter on the  right than left respectively. The right subclavian and innominate  artery is moderately tortuous, where as there is only mild  tortuosity of the left subclavian artery.  Lungs/Pleura: Assessment of the lung parenchyma is limited by  considerable patient respiratory motion. With these limitations in  mind, there is no definite suspicious appearing pulmonary nodule or  mass. Patchy areas of  ground-glass attenuation and mild subpleural  reticulation are noted throughout the lung bases bilaterally.  Subsegmental atelectasis or scarring in the medial segment of the  right middle lobe and medial aspect of the right upper lobe. No  acute consolidative airspace disease. No pleural effusions.  Musculoskeletal: There are no aggressive appearing lytic or blastic  lesions noted in the visualized portions of the skeleton.  Review of the MIP images confirms the above findings.  IMPRESSION:  1. Extensively calcified and thickened aortic valve compatible  with the patient's reported history of aortic stenosis.  Atherosclerosis, including three-vessel coronary artery disease.  Please note that although the presence of coronary artery calcium  documents the presence of coronary artery disease, the severity of  this disease and any potential stenosis cannot be assessed on this  non-gated CT examination. Assessment for potential risk factor  modification, dietary therapy or pharmacologic therapy may be  warranted, if clinically indicated.  3. The subclavian arteries appear suitable for potential upper  extremity access for low-profile TAVR devices if clinically  desired.  4. Limited evaluation of the lung parenchyma demonstrates patchy  areas of probable scarring lungs bilaterally, as above.  CTA ABDOMEN AND PELVIS  VASCULAR MEASUREMENTS PERTINENT TO TAVR:  AORTA:  Minimal Aortic Diameter - 14 x 9 mm  Severity of Aortic Calcification - severe  RIGHT PELVIS:  Right Common Iliac Artery -  Minimal Diameter - 6.3 x 10.2 mm  Tortuosity - moderate  Calcification - moderate  Right External Iliac Artery -  Minimal Diameter - 6.6 x 7.1 mm  Tortuosity - mild  Calcification - minimal  Right Common Femoral Artery -  Minimal Diameter - 6.4 x 7.0 mm  Tortuosity - mild  Calcification - mild  LEFT PELVIS:  Left Common Iliac Artery -  Minimal Diameter - 6.9 x 6.8 mm  Tortuosity - mild   Calcification - moderate  Left External Iliac Artery -  Minimal Diameter - 6.1 x 6.8 mm  Tortuosity - mild  Calcification - minimal  Left Common Femoral Artery -  Minimal Diameter - 6.5 x 7.2 mm  Tortuosity - mild  Calcification - mild  Other Findings:  Abdomen/Pelvis: The appearance of the liver, gallbladder,  pancreas, spleen and bilateral adrenal glands is unremarkable.  There is significant parenchymal atrophy throughout the kidneys  bilaterally. Multiple low attenuation renal lesions are noted.  The smallest of these are too small to definitively characterize,  while the larger lesions are compatible with simple cysts, with the  largest cyst measuring 3 cm in diameter in the upper pole of the  right kidney posteriorly. An IVC filter is in place with tip  terminating immediately below the level of the right renal vein.  Numerous colonic diverticula are noted without surrounding  inflammatory changes to suggest an acute diverticulitis at this  time. Tiny umbilical hernia containing only omental fat  incidentally noted. No significant volume of ascites. No  pneumoperitoneum. No pathologic distension of small bowel. No  definite pathologic lymphadenopathy identified within the abdomen  or pelvis on today's examination. Status post hysterectomy.  Ovaries are atrophic. Urinary bladder is unremarkable in  appearance.  Musculoskeletal: There are no aggressive appearing lytic or blastic  lesions noted in the visualized portions of the skeleton.  Review of the MIP images confirms the above findings.  IMPRESSION:  1. Vascular findings and measurements pertinent to potential TAVR  procedure, as detailed above. The patient does appear to have  suitable pelvic arterial access for low-profile devices.  2. Additional incidental findings, as above.  6 MINUTE WALK: The patient walked 10 feet and developed severe shortness of breath and mild chest pain.  Physical therapy assessment: The  patient has a frailty index of 6 which indicates moderately frail.  The patient could not complete her time to get up and go, 32nd  chair stand test, or 4 stage balance test. All of this indicates high fall risk.  Pulmonary Function Tests  Baseline  FVC 2.00 L (101% predicted)  FEV1 1.46 L (100% predicted)  FEF25-75 0.89 L (77% predicted)  RV 2.17 L (105% predicted)  DLCO 16.18% predicted      STS RISK CALCULATOR:  STS Risk Calculator  Procedure AVR  Risk of Mortality 6.1%  Morbidity or Mortality 33.0%  Prolonged LOS 19.3%  Short LOS 12.0%  Permanent Stroke 3.9%  Prolonged Vent Support 27.2%  DSW Infection 0.4%  Renal Failure 7.4%  Reoperation 12.3%   ASSESSMENT AND PLAN:  79 year old woman with severe symptomatic aortic stenosis, low gradient in the setting of nonischemic cardiomyopathy. Dobutamine echocardiography confirmed severe aortic stenosis. Morphologically the patient's valve is heavily calcified with restricted opening as visualized on echocardiography and plain fluoroscopy. Her symptoms have clearly progressed over recent months and she has developed New [place] Heart Association class IV symptoms. We have discussed treatment options for her severe aortic stenosis at length.TAVR is a reasonable treatment alternative. She has extremely limited functional capacity as indicated with an objective physical therapy assessment. She also has multiple medical comorbidities including history of stroke, recurrent pulmonary emboli with IVC filter, and heart failure. The patient does appear to have a bicuspid aortic valve. The patient and her son understand the lack of randomized controlled data or FDA approval for TAVR in patients with a bicuspid aortic valve. However, her valve morphology does have favorable characteristics with a relatively normal and noncalcified aortic valve annulus. She would like to seek treatment and I think TAVR is a reasonable treatment option for her since she is not a  candidate for open cardiac surgery. With her progressive symptoms, I would predict continued decline without any intervention. They understand the risk of perivalvular aortic insufficiency may be increased. They also understand that there is an increased risk of hemodynamic instability and other serious procedural complications because of her left ventricular dysfunction. Other specific risks were discussed, and these include stroke, myocardial infarction, cardiac perforation, valve embolization, arrhythmia, vascular injury, infection, and bleeding. In the case of catastrophic complication, short-term cardiopulmonary bypass may be required. Sternotomy would only be performed only specific circumstances with a relatively straightforward complication such as valve embolization. The patient and her son clearly understand the plans for surgery and agree to proceed.  Tonny Bollman 12/21/2012 5:54 AM

## 2012-12-21 NOTE — Progress Notes (Signed)
Pt states that she needs nitroglycerin "quite a bit" .  Last took one on Sunday.  States it relieves the chest pain but sometimes needs 2-3.  Pt states fell on Sunday and her coccyx has been very sore ever since. Has not had it checked out.  States did not black out when fell (before or after)

## 2012-12-21 NOTE — Progress Notes (Signed)
Utilization Review Completed.Abhinav Mayorquin T9/05/2012  

## 2012-12-21 NOTE — Anesthesia Procedure Notes (Addendum)
Procedure Name: Intubation Date/Time: 12/21/2012 12:58 PM Performed by: De Nurse Pre-anesthesia Checklist: Patient identified, Emergency Drugs available, Suction available and Patient being monitored Patient Re-evaluated:Patient Re-evaluated prior to inductionOxygen Delivery Method: Circle system utilized Preoxygenation: Pre-oxygenation with 100% oxygen Intubation Type: IV induction Ventilation: Mask ventilation without difficulty and Oral airway inserted - appropriate to patient size Laryngoscope Size: Mac and 3 Grade View: Grade I Tube type: Oral Tube size: 8.0 mm Number of attempts: 1 Airway Equipment and Method: Stylet Placement Confirmation: ETT inserted through vocal cords under direct vision,  positive ETCO2 and CO2 detector Secured at: 21 cm Tube secured with: Tape Dental Injury: Teeth and Oropharynx as per pre-operative assessment     PA catheter:  Routine monitors. Timeout, sterile prep, drape, FBP R neck.  Trendelenburg position.  1% Lido local, finder and trocar RIJ 1st pass with US guidance.  Cordis placed over J wire. PA catheter in easily, transient PVCs.  Sterile dressing applied.  Patient tolerated well, VSS.  Sandford Craze, MD  11:25-11:40

## 2012-12-21 NOTE — OR Nursing (Signed)
First call made to 2300 @ 1450 and family called and notified closure of procedure.

## 2012-12-21 NOTE — Op Note (Signed)
[facility]  TAVR OPERATIVE NOTE   Date of Procedure:  12/21/2012  Preoperative Diagnosis: Severe Aortic Stenosis   Postoperative Diagnosis: Same   Procedure:    Transcatheter Aortic Valve Replacement - Transfemoral Approach  Edwards Sapien XT THV (size 29 mm, model # 9300TFX, serial # [id number])   Co-Surgeons:  Salvatore Decent. Cornelius Moras, MD and Tonny Bollman, MD  Assistants:   Verne Carrow, MD  Anesthesiologist:  Jairo Ben, MD  Echocardiographer:  Marca Ancona, MD  Pre-operative Echo Findings:  severe aortic stenosis and mild AI  Severely depressed left ventricular systolic function with LVEF 30%  Mild MR  Post-operative Echo Findings:  No/trace paravalvular leak  Severely depressed left ventricular systolic function  Mild MR  BRIEF CLINICAL NOTE AND INDICATIONS FOR SURGERY  During the course of the patient's preoperative work up they have been evaluated comprehensively by a multidisciplinary team of specialists coordinated through the Multidisciplinary [facility]ve Clinic in the [facility].  They have been demonstrated to suffer from symptomatic severe aortic stenosis as noted above. The patient has been counseled extensively as to the relative risks and benefits of all options for the treatment of severe aortic stenosis including long term medical therapy, conventional surgery for aortic valve replacement, and transcatheter aortic valve replacement.  The patient has been independently evaluated by two cardiac surgeons including Dr Cornelius Moras and Dr. Laneta Simmers, and they are felt to be at high risk for conventional surgical aortic valve replacement based upon a predicted risk of mortality using the Society of Thor[employer] Surgeons risk calculator of 6.1% Both surgeons indicated the patient would be a poor candidate for conventional surgery (predicted risk of mortality >15% and/or predicted risk of permanent morbidity >50%) because of  comorbidities including poor functional status with moderate-severe frailty, severe LV dysfunction with NYHA Class 4 CHF, hx of stroke, and recurrent PE with an IVC filter .   Based upon review of all of the patient's preoperative diagnostic tests they are felt to be candidate for transcatheter aortic valve replacement using the transfemoral approach an alternative to high risk conventional surgery.    Following the decision to proceed with transcatheter aortic valve replacement, a discussion has been held regarding what types of management strategies would be attempted intraoperatively in the event of life-threatening complications, including whether or not the patient would be considered a candidate for the use of cardiopulmonary bypass and/or conversion to open sternotomy for attempted surgical intervention.  The patient has been advised of a variety of complications that might develop peculiar to this approach including but not limited to risks of death, stroke, paravalvular leak, aortic dissection or other major vascular complications, aortic annulus rupture, device embolization, cardiac rupture or perforation, acute myocardial infarction, arrhythmia, heart block or bradycardia requiring permanent pacemaker placement, congestive heart failure, respiratory failure, renal failure, pneumonia, infection, other late complications related to structural valve deterioration or migration, or other complications that might ultimately cause a temporary or permanent loss of functional independence or other long term morbidity.  The patient provides full informed consent for the procedure as described and all questions were answered preoperatively.  DETAILS OF THE OPERATIVE PROCEDURE  PREPARATION:    The patient is brought to the operating room on the above mentioned date and central monitoring was established by the anesthesia team including placement of [name]-Ganz catheter and radial arterial line. The patient is  placed in the supine position on the operating table.  Intravenous antibiotics are administered. General endotracheal anesthesia  is induced uneventfully. A Foley catheter is placed.  Baseline transesophageal echocardiogram was performed with findings as noted above.  The patient's chest, abdomen, both groins, and both lower extremities are prepared and draped in a sterile manner. A time out procedure is performed.  PERIPHERAL ACCESS:    Using the modified Seldinger technique, femoral arterial and venous access was obtained with placement of 6 Fr sheaths on the right side.  A pigtail diagnostic catheter was passed through the RFA arterial sheath under fluoroscopic guidance into the aortic root.  A temporary transvenous pacemaker catheter was passed through the right femoral venous sheath under fluoroscopic guidance into the right ventricle.  Caution was taken when crossing the IVC filter. The pacemaker was tested to ensure stable lead placement and pacemaker capture. Aortic root angiography was performed in order to determine the optimal angiographic angle for valve deployment.   TRANSFEMORAL ACCESS:   A left femoral arterial cutdown was performed by Dr Cornelius Moras. Please see his separate operative note for details. The patient was heparinized systemically and ACT verified > 250 seconds.    A 20 Fr transfemoral sheath was introduced into the left femoral artery after progressively dilating over an Amplatz superstiff wire. An AL-1 catheter was used to direct a straight-tip exchange length wire across the native aortic valve into the left ventricle. This was exchanged out for a pigtail catheter and position was confirmed in the LV apex. The pigtail catheter was then exchanged for an Amplatz Extra-stiff wire in the LV apex. At that point, BAV was performed using a 25 mm valvuloplasty balloon.  Once optimal position was achieved, BAV was done under rapid ventricular pacing at 180 bpm. The patient recovered well.    TRANSCATHETER HEART VALVE DEPLOYMENT:  An Edwards Sapien XT THV (size 29 mm) was prepared and crimped per manufacturer's guidelines, and the proper orientation of the valve is confirmed on the [employer] delivery system. The valve was advanced through the introducer sheath using normal technique until in an appropriate position in the abdominal aorta beyond the sheath tip. The balloon was then retracted and using the fine-tuning wheel was centered on the valve. The valve was then advanced across the aortic arch using appropriate flexion of the catheter. The valve was carefully positioned across the aortic valve annulus. Once final position of the valve has been confirmed, the valve is deployed while temporarily holding ventilation and during rapid ventricular pacing to maintain systolic blood pressure < 50 mmHg and pulse pressure < 10 mmHg. The balloon inflation is held for >3 seconds after reaching full deployment volume. Once the balloon has fully deflated the balloon is retracted into the ascending aorta and valve function is assessed using TEE. There is felt to be trace paravalvular leak and no central aortic insufficiency.  The patient's hemodynamic recovery following valve deployment is good.  The deployment balloon and guidewire are both removed. Echo demostrated acceptable post-procedural gradients, stable mitral valve function, and only mild AI. Aortic root aortography was deferred because of CKD.  PROCEDURE COMPLETION:  The sheath was pulled back into the ipsilateral external iliac artery and an abdominal aortogram was performed. There was no angiographic evidence of vascular injury. The sheath was removed and the femoral artery repaired by Dr Cornelius Moras. Please see his separate operative report for details. Protamine was administered once femoral arterial repair was complete. The temporary pacemaker, pigtail catheters and femoral sheaths were removed with manual pressure used for hemostasis.    The patient tolerated the procedure well  and is transported to the surgical intensive care in stable condition. There were no immediate intraoperative complications. All sponge instrument and needle counts are verified correct at completion of the operation.   No blood products were administered during the operation.  The patient received a total of 50 mL of intravenous contrast during the procedure.  Tonny Bollman 12/21/2012 3:13 PM

## 2012-12-21 NOTE — Progress Notes (Signed)
Patient's SBP was 160-170s, nitro drip was increased from to and then to (max per order). Patient was given metoprolol 2.5mg  and morphine 2mg .  Will assess the patient and will administer the remaining 2.5mg  of metoprolol if needed.

## 2012-12-21 NOTE — Progress Notes (Signed)
Paged Dr. Excell Seltzer about Patient being hypertensive per cuff BP.  Nitro at 169mcg/ Lopressor 2.5 mg given.   Received orders for hydralazine 10mg  IV Q4H and Labetalol 20mg  IV Q2H for SBP > 160 or MAP > 90

## 2012-12-21 NOTE — OR Nursing (Signed)
Arterial pressure held for 20 minutes Right Groin and Venous Pressure held Right Groin for 5 minutes by Smokey Twine [facility]IS.

## 2012-12-21 NOTE — Anesthesia Preprocedure Evaluation (Addendum)
Anesthesia Evaluation  Patient identified by MRN, date of birth, ID band Patient awake    Reviewed: Allergy & Precautions, H&P , NPO status , Patient's Chart, lab work & pertinent test results  History of Anesthesia Complications (+) PONV  Airway Mallampati: II TM Distance: >3 FB Neck ROM: Full    Dental  (+) Edentulous Upper and Edentulous Lower   Pulmonary sleep apnea , COPD COPD inhaler and oxygen dependent, PE breath sounds clear to auscultation  Pulmonary exam normal       Cardiovascular hypertension, Pt. on medications and Pt. on home beta blockers +CHF + dysrhythmias (INR 1.42) Atrial Fibrillation + pacemaker (for tachy-brady) - Cardiac Defibrillator + Valvular Problems/Murmurs (AVA 0.76 cm2) AS Rhythm:Irregular Rate:Normal  7/14 stress: no ischemia, EF 20-25%   Neuro/Psych Chronic back pain: narcotics    GI/Hepatic Neg liver ROS, GERD-  Medicated and Controlled,  Endo/Other  negative endocrine ROS  Renal/GU Renal InsufficiencyRenal disease (creat 1.47)     Musculoskeletal   Abdominal   Peds  Hematology   Anesthesia Other Findings   Reproductive/Obstetrics                          Anesthesia Physical Anesthesia Plan  ASA: IV  Anesthesia Plan: General   Post-op Pain Management:    Induction: Intravenous  Airway Management Planned: Oral ETT  Additional Equipment: Arterial line, PA Cath, 3D TEE and Ultrasound Guidance Line Placement  Intra-op Plan:   Post-operative Plan: Post-operative intubation/ventilation  Informed Consent: I have reviewed the patients History and Physical, chart, labs and discussed the procedure including the risks, benefits and alternatives for the proposed anesthesia with the patient or authorized representative who has indicated his/her understanding and acceptance.     Plan Discussed with: CRNA and Surgeon  Anesthesia Plan Comments: (Plan routine  monitors, A line, PA cath, GETA with TEE and post op ventilation)        Anesthesia Quick Evaluation

## 2012-12-21 NOTE — Op Note (Signed)
CARDIOTHORACIC SURGERY OPERATIVE NOTE  Date of Procedure:  12/21/2012  Preoperative Diagnosis: Severe Aortic Stenosis   Postoperative Diagnosis: Same   Procedure:    Transcatheter Aortic Valve Replacement - Open Left Transfemoral Approach  Edwards Sapien XT (size 29 mm, model # 9300TFX, serial # [id number])   Co-Surgeons:  Salvatore Decent. Cornelius Moras, MD and Tonny Bollman, MD  Assistants:   Verne Carrow, MD  Anesthesiologist:  Germaine Pomfret, MD  Echocardiographer:  Marca Ancona, MD  Pre-operative Echo Findings:  Low gradient severe aortic stenosis  Severe left ventricular systolic dysfunction (EF 25%)  Mild mitral regurgitation  Post-operative Echo Findings:  Trivial paravalvular leak  Unchanged left ventricular systolic function      DETAILS OF THE OPERATIVE PROCEDURE  The majority of the procedure is documented separately in a procedure note by Dr. Excell Seltzer.   TRANSFEMORAL ACCESS:   A small incision is made in the left groin immediately over the common femoral artery. The subcutaneous tissues are divided with electrocautery and the anterior surface of the common femoral artery is identified. Sharp dissection is utilized to free up the artery proximally and distally and the vessel is encircled with a vessel loop.  A pair of CV-4 Gore-tex sutures are place as diamond-shaped purse-strings on the anterior surface of the femoral artery.  The patient is heparinized systemically and ACT verified > 250 seconds.  The common femoral artery is punctured using an 18 gauge needle and a soft J-tipped guidewire is passed into the common iliac artery under fluoroscopic guidance.  A 6 Fr straight diagnostic catheter is placed over the guidewire and the guidewire is removed.  An Amplatz super stiff guidewire is passed through the sheath into the descending thoracic aorta and the introducing diagnostic catheter is removed.  Serial dilators are passed over the guidewire under continuous  fluoroscopic guidance, making certain that each dilator passes easily all of the way into the distal abdominal aorta.  A 20 Fr Edwards Novoflex Plus introducer sheath is passed over the guidewire into the abdominal aorta.  The introducing dilator is removed, the sheath is flushed with heparinized saline, and the sheath is secured to the skin.    FEMORAL SHEATH REMOVAL AND ARTERIAL CLOSURE:  After the completion of successful valve deployment as documented separately by Dr. Excell Seltzer, the femoral artery sheath is removed and the arteriotomy is closed using the previously placed Gore-tex purse-string sutures. Once the repair has been completed protamine was administered to reverse the anticoagulation. A digitally-subtracted arteriogram is obtained from just above the aortic bifurcation to below the arteriotomy to confirm the integrity of the vascular repair.  The incision is irrigated with saline solution and subsequently closed in multiple layers using absorbable suture.  The skin incision is closed using a subcuticular skin closure.     Salvatore Decent. Cornelius Moras MD 12/21/2012 3:47 PM

## 2012-12-21 NOTE — Progress Notes (Signed)
Patient does not have a cuff leak, Paged Dr. Excell Seltzer, informed him that patient met all other weaning parameters and patient also had a size 8 ETT, per MD, received order to extubate.

## 2012-12-21 NOTE — Transfer of Care (Signed)
Immediate Anesthesia Transfer of Care Note  Patient: [name]  Procedure(s) Performed: Procedure(s): TRANSCATHETER AORTIC VALVE REPLACEMENT, TRANSFEMORAL (N/A) INTRAOPERATIVE TRANSESOPHAGEAL ECHOCARDIOGRAM (N/A) INSERTION CENTRAL LINE ADULT (Left)  Patient Location: SICU  Anesthesia Type:General  Level of Consciousness: sedated, unresponsive and Patient remains intubated per anesthesia plan  Airway & Oxygen Therapy: Patient remains intubated per anesthesia plan and Patient placed on Ventilator (see vital sign flow sheet for setting)  Post-op Assessment: Report given to PACU RN and Post -op Vital signs reviewed and stable  Post vital signs: Reviewed and stable  Complications: No apparent anesthesia complications

## 2012-12-21 NOTE — Progress Notes (Addendum)
Notified [name] from [employer] of pt having a pacemaker and her surgery is  Today at noon. She stated she would come by and see pt and if anesthesia needed her to call her.  [name] here with pt at 0900 and investigated the pacemaker.  Requested to be notified about 1 hour before surgery time so that she can talk with MD's about pacemaker needs.

## 2012-12-22 ENCOUNTER — Other Ambulatory Visit: Payer: Self-pay

## 2012-12-22 ENCOUNTER — Inpatient Hospital Stay (HOSPITAL_COMMUNITY): Payer: PRIVATE HEALTH INSURANCE

## 2012-12-22 DIAGNOSIS — I359 Nonrheumatic aortic valve disorder, unspecified: Secondary | ICD-10-CM

## 2012-12-22 DIAGNOSIS — I4891 Unspecified atrial fibrillation: Secondary | ICD-10-CM

## 2012-12-22 DIAGNOSIS — I10 Essential (primary) hypertension: Secondary | ICD-10-CM

## 2012-12-22 DIAGNOSIS — Z954 Presence of other heart-valve replacement: Secondary | ICD-10-CM

## 2012-12-22 LAB — GLUCOSE, CAPILLARY
Glucose-Capillary: 117 mg/dL — ABNORMAL HIGH (ref 70–99)
Glucose-Capillary: 102 mg/dL — ABNORMAL HIGH (ref 70–99)
Glucose-Capillary: 111 mg/dL — ABNORMAL HIGH (ref 70–99)
Glucose-Capillary: 127 mg/dL — ABNORMAL HIGH (ref 70–99)
Glucose-Capillary: 109 mg/dL — ABNORMAL HIGH (ref 70–99)

## 2012-12-22 LAB — CBC
HCT: 28.8 % — ABNORMAL LOW (ref 36.0–46.0)
Hemoglobin: 10.1 g/dL — ABNORMAL LOW (ref 12.0–15.0)
MCHC: 35.1 g/dL (ref 30.0–36.0)
Platelets: 115 10*3/uL — ABNORMAL LOW (ref 150–400)
RBC: 3.16 MIL/uL — ABNORMAL LOW (ref 3.87–5.11)
RBC: 3.25 MIL/uL — ABNORMAL LOW (ref 3.87–5.11)
RDW: 15.4 % (ref 11.5–15.5)
WBC: 7.6 10*3/uL (ref 4.0–10.5)

## 2012-12-22 LAB — MAGNESIUM: Magnesium: 1.8 mg/dL (ref 1.5–2.5)

## 2012-12-22 LAB — CREATININE, SERUM
Creatinine, Ser: 1.33 mg/dL — ABNORMAL HIGH (ref 0.50–1.10)
GFR calc Af Amer: 43 mL/min — ABNORMAL LOW (ref >90)
GFR calc non Af Amer: 37 mL/min — ABNORMAL LOW (ref >90)

## 2012-12-22 LAB — BASIC METABOLIC PANEL
BUN: 21 mg/dL (ref 6–23)
CO2: 24 mEq/L (ref 19–32)
Calcium: 8.1 mg/dL — ABNORMAL LOW (ref 8.4–10.5)
Creatinine, Ser: 1.3 mg/dL — ABNORMAL HIGH (ref 0.50–1.10)
Glucose, Bld: 108 mg/dL — ABNORMAL HIGH (ref 70–99)

## 2012-12-22 MED ORDER — IRBESARTAN 75 MG PO TABS
75.0000 mg | ORAL_TABLET | Freq: Every day | ORAL | Status: DC
  Administered 2012-12-23 – 2012-12-28 (×6): 75 mg via ORAL
  Filled 2012-12-22 (×7): qty 1

## 2012-12-22 MED ORDER — GUAIFENESIN ER 600 MG PO TB12
600.0000 mg | ORAL_TABLET | Freq: Two times a day (BID) | ORAL | Status: DC
  Administered 2012-12-22 – 2012-12-28 (×12): 600 mg via ORAL
  Filled 2012-12-22 (×15): qty 1

## 2012-12-22 MED ORDER — CEFEPIME HCL 1 G IJ SOLR
1.0000 g | INTRAMUSCULAR | Status: DC
  Administered 2012-12-22 – 2012-12-25 (×4): 1 g via INTRAVENOUS
  Filled 2012-12-22 (×4): qty 1

## 2012-12-22 MED ORDER — HYDRALAZINE HCL 10 MG PO TABS
10.0000 mg | ORAL_TABLET | Freq: Three times a day (TID) | ORAL | Status: DC
  Administered 2012-12-22 – 2012-12-28 (×18): 10 mg via ORAL
  Filled 2012-12-22 (×22): qty 1

## 2012-12-22 MED ORDER — CARVEDILOL 12.5 MG PO TABS
12.5000 mg | ORAL_TABLET | Freq: Two times a day (BID) | ORAL | Status: DC
  Administered 2012-12-22 – 2012-12-28 (×12): 12.5 mg via ORAL
  Filled 2012-12-22 (×15): qty 1

## 2012-12-22 MED ORDER — INSULIN ASPART 100 UNIT/ML ~~LOC~~ SOLN
0.0000 [IU] | SUBCUTANEOUS | Status: DC
  Administered 2012-12-22 – 2012-12-27 (×9): 2 [IU] via SUBCUTANEOUS

## 2012-12-22 MED ORDER — FUROSEMIDE 10 MG/ML IJ SOLN
40.0000 mg | Freq: Once | INTRAMUSCULAR | Status: AC
  Administered 2012-12-22: 40 mg via INTRAVENOUS
  Filled 2012-12-22: qty 4

## 2012-12-22 MED ORDER — DIPHENHYDRAMINE HCL 12.5 MG/5ML PO LIQD
12.5000 mg | Freq: Four times a day (QID) | ORAL | Status: DC | PRN
  Administered 2012-12-22 – 2012-12-23 (×3): 12.5 mg via ORAL
  Filled 2012-12-22 (×2): qty 5

## 2012-12-22 MED ORDER — ENOXAPARIN SODIUM 30 MG/0.3ML ~~LOC~~ SOLN
30.0000 mg | Freq: Every day | SUBCUTANEOUS | Status: DC
  Administered 2012-12-22 – 2012-12-27 (×6): 30 mg via SUBCUTANEOUS
  Filled 2012-12-22 (×7): qty 0.3

## 2012-12-22 NOTE — Evaluation (Signed)
Clinical/Bedside Swallow Evaluation Patient Details  Name: [name] MRN: [id number] Date of Birth: 03-30-1933  Today's Date: 12/22/2012 Time: 0815-0828 SLP Time Calculation (min): 13 min  Past Medical History:  Past Medical History  Diagnosis Date  . Syncope   . Symptomatic bradycardia   . Acute on chronic renal failure   . Chronic lower back pain   . Nonischemic cardiomyopathy   . HTN (hypertension)   . HLD (hyperlipidemia)   . Atrial fibrillation     tachy-brady syndrome with <1% recurrent PAF since pacemaker placement  . CKD (chronic kidney disease)   . H/O: stroke   . Pulmonary embolism     HISTORY OF, the pt. had a recurrent bilateral pulmonary emboli in 2005, on warfarin therapy and at which time she under went implantation of IVC filter  . Anemia   . Dementia   . Gastroesophageal reflux disease   . Aortic stenosis 10/13/2012    Low EF, low gradient with severe aortic stenosis confirmed by dobutamine stress echocardiogram   . PONV (postoperative nausea and vomiting)   . Shortness of breath on exertion   . Osteoarthritis   . Neuropathy   . Spinal stenosis   . Sleep apnea     uses oxygen at night and PRN  . S/P TAVR (transcatheter aortic valve replacement) 12/21/2012    29mm Edwards Sapien XT transcatheter heart valve placed via open left transfemoral approach  . Pacemaker   . Stroke   . CHF (congestive heart failure)   . Fibromyalgia    Past Surgical History:  Past Surgical History  Procedure Laterality Date  . Cardiac defibrillator placement    . Pacemaker insertion     HPI:  79 year old woman presenting with severe low-gradient aortic stenosis presenting for scheduled transcatheter aortic valve replacement. Cardiac catheterization demonstrated minor nonobstructive coronary artery disease. The patient underwent a cardiac gated CT which indicated a type I bicuspid aortic valve with fusion of the left and right coronary cusps. Her pelvic anatomy based on  findings of CT angiography demonstrated suitable access for transcatheter aortic valve replacement.  Pt. underwent valve replacement 12/21/12.  CXR shows Intervalworsening of aeration at the bases which could represent atelectasis after extubation or aspiration.Other PMH: HTN, CVA, PE, dementia, GERD,  Pt. acknowledges difficulty swallowing solids and liquids at times.   Assessment / Plan / Recommendation Clinical Impression  Prior to swallow assessment pt. noted to have a strong continuous dry cough.  Laryngeal elevation with manual palpation is weak with decreased coordination, suspected delayed swallow initiation and suspected decreased pharyngeal contraction.  Given clinical findings, reports of dysphagia with thickener after prior stroke, recommend FEES to fully assess swallow function.    Aspiration Risk  Moderate    Diet Recommendation NPO        Other  Recommendations Recommended Consults: FEES Oral Care Recommendations: Oral care BID   Follow Up Recommendations   (TBD)    Frequency and Duration        Pertinent Vitals/Pain none         Swallow Study           Oral/Motor/Sensory Function Overall Oral Motor/Sensory Function: Appears within functional limits for tasks assessed   Ice Chips Ice chips: Impaired Presentation: Spoon Pharyngeal Phase Impairments: Suspected delayed Swallow;Decreased hyoid-laryngeal movement;Cough - Delayed   Thin Liquid Thin Liquid: Impaired Presentation: Spoon Pharyngeal  Phase Impairments: Suspected delayed Swallow;Decreased hyoid-laryngeal movement;Throat Clearing - Immediate;Cough - Delayed    Nectar Thick Nectar Thick Liquid: Not  tested   Honey Thick Honey Thick Liquid: Not tested   Puree Puree: Impaired Pharyngeal Phase Impairments: Suspected delayed Swallow;Decreased hyoid-laryngeal movement   Solid       Solid: Not tested       [name] M.Ed [employer] [phone]  12/22/2012

## 2012-12-22 NOTE — Progress Notes (Signed)
Reported off to oncoming shift RN.  VSS. Safety maintained.

## 2012-12-22 NOTE — Consult Note (Signed)
ANTIBIOTIC CONSULT NOTE - INITIAL  Pharmacy Consult for Cefepime Indication: suspected pneumonia  Allergies  Allergen Reactions  . Codeine Hives  . Darvocet [Propoxyphene-Acetaminophen] Hives  . Nubain [Nalbuphine Hcl] Hives    Patient Measurements: Height: 4\' 10"  (147.3 cm) Weight: 166 lb 14.2 oz (75.7 kg) IBW/kg (Calculated) : 40.9  Vital Signs: Temp: 100.8 F (38.2 C) (09/03 0800) BP: 131/49 mmHg (09/03 0800) Pulse Rate: 70 (09/03 0800) Intake/Output from previous day: 09/02 0701 - 09/03 0700 In: 3799.3 [I.V.:2919.3; Blood:350; NG/GT:30; IV Piggyback:500] Out: 1690 [Urine:1440; Blood:250] Intake/Output from this shift: Total I/O In: 70 [I.V.:20; IV Piggyback:50] Out: 50 [Urine:50]  Labs:  Recent Labs  12/21/12 1609  12/21/12 2135 12/21/12 2137 12/22/12 0315  WBC 3.6*  --  5.6  --  7.1  HGB 10.3*  < > 10.9* 10.5* 10.1*  PLT 136*  --  141*  --  131*  CREATININE  --   --  1.23* 1.40* 1.30*  < > = values in this interval not displayed. Estimated Creatinine Clearance: 30.9 ml/min (by C-G formula based on Cr of 1.3).  Medical History: Past Medical History  Diagnosis Date  . Syncope   . Symptomatic bradycardia   . Acute on chronic renal failure   . Chronic lower back pain   . Nonischemic cardiomyopathy   . HTN (hypertension)   . HLD (hyperlipidemia)   . Atrial fibrillation     tachy-brady syndrome with <1% recurrent PAF since pacemaker placement  . CKD (chronic kidney disease)   . H/O: stroke   . Pulmonary embolism     HISTORY OF, the pt. had a recurrent bilateral pulmonary emboli in 2005, on warfarin therapy and at which time she under went implantation of IVC filter  . Anemia   . Dementia   . Gastroesophageal reflux disease   . Aortic stenosis 10/13/2012    Low EF, low gradient with severe aortic stenosis confirmed by dobutamine stress echocardiogram   . PONV (postoperative nausea and vomiting)   . Shortness of breath on exertion   . Osteoarthritis    . Neuropathy   . Spinal stenosis   . Sleep apnea     uses oxygen at night and PRN  . S/P TAVR (transcatheter aortic valve replacement) 12/21/2012    29mm Edwards Sapien XT transcatheter heart valve placed via open left transfemoral approach  . Pacemaker   . Stroke   . CHF (congestive heart failure)   . Fibromyalgia    Assessment: 80yof POD#1 TAVR reports having a cough for 1-2 weeks pre-op and the cough has continued post-op. CXR shows worsening aeration and possible aspiration. She will begin empiric cefepime. She has some renal insufficiency with sCr 1.3 and CrCl 43ml/min.  She has received 2 doses of post-op zinacef with the 3rd dose due at [address], however, will discontinue to avoid duplicate therapy with cefepime.  Goal of Therapy:  Appropriate cefepime dosing  Plan:  1) Cefepime 1g IV q24 2) Follow renal function, LOT  Fredrik Rigger 12/22/2012,9:27 AM

## 2012-12-22 NOTE — Progress Notes (Signed)
Dr. Clifton James and Cardiothoracic PA notified of extreme coughing and tachypnea.  Speech Therapy consult requested.  Patient to remain NPO at this time until further evaluated by speech therapy.

## 2012-12-22 NOTE — Progress Notes (Signed)
Speech therapy at bedside for advanced swallow evaluation.

## 2012-12-22 NOTE — Progress Notes (Addendum)
SUBJECTIVE: Dry cough. No pain.   BP 122/42  Pulse 65  Temp(Src) 100.6 F (38.1 C) (Oral)  Resp 14  Ht 4\' 10"  (1.473 m)  Wt 166 lb 14.2 oz (75.7 kg)  BMI 34.89 kg/m2  SpO2 99%  Intake/Output Summary (Last 24 hours) at 12/22/12 0659 Last data filed at 12/22/12 0500  Gross per 24 hour  Intake 3559.25 ml  Output   1630 ml  Net 1929.25 ml    PHYSICAL EXAM General: Well developed, well nourished, in no acute distress. Alert and oriented x 3.  Psych:  Good affect, responds appropriately Neck: No JVD. No masses noted.  Lungs: Scattered rhonci. Decreased BS bases. no wheezes Heart: RRR with no murmurs noted. Abdomen: Bowel sounds are present. Soft, non-tender.  Extremities: No lower extremity edema. Left groin surgical site dry, clean.   LABS: Basic Metabolic Panel:  Recent Labs  16/10/96 2135 12/21/12 2137 12/22/12 0315  NA  --  143 142  K  --  4.1 4.5  CL  --  109 112  CO2  --   --  24  GLUCOSE  --  104* 108*  BUN  --  21 21  CREATININE 1.23* 1.40* 1.30*  CALCIUM  --   --  8.1*  MG 1.9  --  1.8   CBC:  Recent Labs  12/21/12 2135 12/21/12 2137 12/22/12 0315  WBC 5.6  --  7.1  HGB 10.9* 10.5* 10.1*  HCT 31.4* 31.0* 28.8*  MCV 90.5  --  91.1  PLT 141*  --  131*   Current Meds: . amiodarone  100 mg Oral Daily  . aspirin EC  325 mg Oral Daily   Or  . aspirin  324 mg Per Tube Daily  . bisacodyl  10 mg Oral Daily   Or  . bisacodyl  10 mg Rectal Daily  . cefUROXime (ZINACEF)  IV  1.5 g Intravenous Q12H  . colchicine  0.6 mg Oral QODAY  . docusate sodium  200 mg Oral Daily  . donepezil  10 mg Oral QHS  . DULoxetine  60 mg Oral Daily  . famotidine (PEPCID) IV  20 mg Intravenous Q12H  . febuxostat  40 mg Oral Daily  . insulin aspart  0-24 Units Subcutaneous Q4H  . insulin regular  0-10 Units Intravenous TID WC  . labetalol      . levothyroxine  100 mcg Oral QAC breakfast  . loratadine  10 mg Oral Daily  . magnesium sulfate  4 g Intravenous Once  .  memantine  10 mg Oral BID  . metoprolol tartrate  12.5 mg Oral BID   Or  . metoprolol tartrate  12.5 mg Per Tube BID  . mupirocin ointment   Topical BID  . pantoprazole  40 mg Oral Daily  . pregabalin  75 mg Oral BID  . simvastatin  10 mg Oral q1800  . sodium chloride  3 mL Intravenous Q12H    ASSESSMENT AND PLAN:  1. Severe aortic valve stenosis: s/p TAVR procedure 12/21/12. Doing well this am. Will d/c [name], arterial line. Echo today. PT/OT eval. Will monitor in ICU today.   2. HTN: BP stable off of NTG drip. Will slowly resume home medications today.   3. Post-op fever/non-productive cough post-extubation: CXR with atelectasis vs aspiration. Aspiration is a possibility with low grade fevers. Follow this am and low threshold to start empiric antibiotics. Repeat CXR in am.   4. Atrial fibrillation: Will plan on resuming  coumadin tomorrow if surgical site stable   [name]  9/3/20146:59 AM

## 2012-12-22 NOTE — Procedures (Addendum)
Objective Swallowing Evaluation: Fiberoptic Endoscopic Evaluation of Swallowing  Patient Details  Name: [name] MRN: [id number] Date of Birth: 11/30/1932  Today's Date: 12/22/2012 Time:  -     4098-1191    Past Medical History:  Past Medical History  Diagnosis Date  . Syncope   . Symptomatic bradycardia   . Acute on chronic renal failure   . Chronic lower back pain   . Nonischemic cardiomyopathy   . HTN (hypertension)   . HLD (hyperlipidemia)   . Atrial fibrillation     tachy-brady syndrome with <1% recurrent PAF since pacemaker placement  . CKD (chronic kidney disease)   . H/O: stroke   . Pulmonary embolism     HISTORY OF, the pt. had a recurrent bilateral pulmonary emboli in 2005, on warfarin therapy and at which time she under went implantation of IVC filter  . Anemia   . Dementia   . Gastroesophageal reflux disease   . Aortic stenosis 10/13/2012    Low EF, low gradient with severe aortic stenosis confirmed by dobutamine stress echocardiogram   . PONV (postoperative nausea and vomiting)   . Shortness of breath on exertion   . Osteoarthritis   . Neuropathy   . Spinal stenosis   . Sleep apnea     uses oxygen at night and PRN  . S/P TAVR (transcatheter aortic valve replacement) 12/21/2012    29mm Edwards Sapien XT transcatheter heart valve placed via open left transfemoral approach  . Pacemaker   . Stroke   . CHF (congestive heart failure)   . Fibromyalgia    Past Surgical History:  Past Surgical History  Procedure Laterality Date  . Cardiac defibrillator placement    . Pacemaker insertion     HPI:  79 year old woman presenting with severe low-gradient aortic stenosis presenting for scheduled transcatheter aortic valve replacement. Cardiac catheterization demonstrated minor nonobstructive coronary artery disease. The patient underwent a cardiac gated CT which indicated a type I bicuspid aortic valve with fusion of the left and right coronary cusps. Her pelvic  anatomy based on findings of CT angiography demonstrated suitable access for transcatheter aortic valve replacement.  Pt. underwent valve replacement 12/21/12.  CXR shows Intervalworsening of aeration at the bases which could represent atelectasis after extubation or aspiration.Other PMH: HTN, CVA, PE, dementia, GERD,  Pt. acknowledges difficulty swallowing solids and liquids at times.  Pt. coughing prior and during BSE (see BSE) and FEES recommended.     Assessment / Plan / Recommendation Clinical Impression  Dysphagia Diagnosis: Mild oral phase dysphagia (min-mild pharyngeal dysphagia) Clinical impression: Pt. sitting in chair for FEES procedure.  Laryngeal structures appeared normal without edema or pooled secretions.  Mild oral dysphagia characterized by piecemeal swallow pattern with liquids leading to likely premature spill and probable penetration with thin (view somewhat obscured with brief clouded lens).  Pt. with reflexive cough that expelled suspected penetrates.  Min-mild pyriform sinus residue with nectar thick that cleared with spontaneous second swallows.  Other times pt. coughing without observation of penetrates.  She is at risk for shortness of breath during meals due to prolonged laryngeal elevation and vocal cord adduction perhaps as a protective strategy.  During exam she complained of a globus sensation in pharynx without po residue observed indicative of possible esophageal component (history of GERD). Pt. wears dentures to eat which are not present.  Discussed various textures with pt. and decided to start Dys 1 thin until dentures present..    Treatment Recommendation  Therapy as outlined  in treatment plan below    Diet Recommendation Dysphagia 1 (Puree);Thin liquid   Liquid Administration via: Cup;Straw Medication Administration: Whole meds with liquid Supervision: Patient able to self feed;Intermittent supervision to cue for compensatory strategies Compensations: Slow  rate;Small sips/bites Postural Changes and/or Swallow Maneuvers: Upright 30-60 min after meal;Seated upright 90 degrees    Other  Recommendations Oral Care Recommendations: Oral care BID   Follow Up Recommendations  None    Frequency and Duration min 2x/week  2 weeks   Pertinent Vitals/Pain Yes, helped to reposition    SLP Swallow Goals Patient will utilize recommended strategies during swallow to increase swallowing safety with: Supervision/safety      Reason for Referral Objectively evaluate swallowing function   Oral Phase Oral Preparation/Oral Phase Oral Phase: Impaired Oral - Thin Oral - Thin Cup: Piecemeal swallowing;Delayed oral transit Oral - Thin Straw: Delayed oral transit;Piecemeal swallowing Oral - Solids Oral - Regular: Delayed oral transit (dentures not present)   Pharyngeal Phase Pharyngeal Phase Pharyngeal Phase: Impaired Pharyngeal - Nectar Pharyngeal - Nectar Cup: Pharyngeal residue - valleculae;Pharyngeal residue - pyriform sinuses (minimal) Pharyngeal - Thin Pharyngeal - Thin Cup: Delayed swallow initiation;Premature spillage to pyriform sinuses;Penetration/Aspiration before swallow Penetration/Aspiration details (thin cup): Material enters airway, remains ABOVE vocal cords then ejected out Pharyngeal - Thin Straw: Delayed swallow initiation;Premature spillage to pyriform sinuses  Cervical Esophageal Phase    GO    Cervical Esophageal Phase Cervical Esophageal Phase: Leonarda Salon         Darrow Bussing.Ed [employer] [phone]  12/22/2012

## 2012-12-22 NOTE — Progress Notes (Signed)
  Echocardiogram 2D Echocardiogram has been performed.  Georgian Co 12/22/2012, 5:44 PM

## 2012-12-22 NOTE — Progress Notes (Addendum)
      [address]       [place] [address]             [phone]      1 Day Post-Op Procedure(s) (LRB): TRANSCATHETER AORTIC VALVE REPLACEMENT, TRANSFEMORAL (N/A) INTRAOPERATIVE TRANSESOPHAGEAL ECHOCARDIOGRAM (N/A) INSERTION CENTRAL LINE ADULT (Left)  Subjective:  [name] has had issues with hypertension and continued non- productive dry cough over night.  She denies pain  Objective: Vital signs in last 24 hours: Temp:  [94.6 F (34.8 C)-101.5 F (38.6 C)] 100.6 F (38.1 C) (09/03 0700) Pulse Rate:  [51-84] 67 (09/03 0700) Cardiac Rhythm:  [-] A-V Sequential paced (09/02 1915) Resp:  [0-38] 18 (09/03 0700) BP: (104-165)/(31-118) 142/118 mmHg (09/03 0700) SpO2:  [96 %-100 %] 100 % (09/03 0700) Arterial Line BP: (107-169)/(39-78) 138/41 mmHg (09/03 0600) FiO2 (%):  [40 %-50 %] 40 % (09/02 2230) Weight:  [158 lb 1.1 oz (71.7 kg)-166 lb 14.2 oz (75.7 kg)] 166 lb 14.2 oz (75.7 kg) (09/03 0500)  Hemodynamic parameters for last 24 hours: PAP: (27-65)/(14-29) 46/21 mmHg CO:  [3.6 L/min-6.7 L/min] 6.7 L/min CI:  [2.2 L/min/m2-4 L/min/m2] 4 L/min/m2  Intake/Output from previous day: 09/02 0701 - 09/03 0700 In: 3799.3 [I.V.:2919.3; Blood:350; NG/GT:30; IV Piggyback:500] Out: 1690 [Urine:1440; Blood:250]  General appearance: alert, cooperative and no distress Heart: regular rate and rhythm Lungs: clear to auscultation bilaterally Abdomen: soft, non-tender; bowel sounds normal; no masses,  no organomegaly Wound: clean and dry  Lab Results:  Recent Labs  12/21/12 2135 12/21/12 2137 12/22/12 0315  WBC 5.6  --  7.1  HGB 10.9* 10.5* 10.1*  HCT 31.4* 31.0* 28.8*  PLT 141*  --  131*   BMET:  Recent Labs  12/21/12 2137 12/22/12 0315  NA 143 142  K 4.1 4.5  CL 109 112  CO2  --  24  GLUCOSE 104* 108*  BUN 21 21  CREATININE 1.40* 1.30*  CALCIUM  --  8.1*    PT/INR:  Recent Labs  12/21/12 1609  LABPROT 16.3*  INR 1.34   ABG      Component Value Date/Time   PHART 7.305* 12/21/2012 2342   HCO3 23.1 12/21/2012 2342   TCO2 24 12/21/2012 2342   ACIDBASEDEF 3.0* 12/21/2012 2342   O2SAT 98.0 12/21/2012 2342   CBG (last 3)   Recent Labs  12/21/12 2010 12/21/12 2305 12/22/12 0725  GLUCAP 93 117* 107*    Assessment/Plan: S/P Procedure(s) (LRB): TRANSCATHETER AORTIC VALVE REPLACEMENT, TRANSFEMORAL (N/A) INTRAOPERATIVE TRANSESOPHAGEAL ECHOCARDIOGRAM (N/A) INSERTION CENTRAL LINE ADULT (Left)  1. CV- off all drips, NSR, mild hypertension this morning- Cardiology to restart home blood pressure medications as tolerated, h/o Atrial Fibrillation for which she takes Coumadin for can likely restart soon, will get ECHO when appropriate 2. Pulm- non-productive cough, CXR with atelectasis vs. Possible aspiration, low grade fever this morning, may benefit from Empiric ABX coverage and Speech evaluation  3. Renal- creatinine 1.30 this appears to be her baseline, mild volume overload 4. CBGs controlled    LOS: 1 day    BARRETT, ERIN 12/22/2012  I have seen and examined the patient and agree with the assessment and plan as outlined.  [name] is coughing non-stop and reports that she has had a mild cough for the last week or two pre-op.  Will start empiric Maxipime.  Mobilize.  Pulm toilet.  Needs diuresis as well.  Alby Schwabe H 12/22/2012 8:35 AM

## 2012-12-22 NOTE — Progress Notes (Addendum)
Patient continues to have a persistent non-productive cough, patient's lung sounds are clear, patient has maintain oxygen sats greater than 95% on 4L since being extubated, will obtain CXR earlier.    Patient states she does have trouble swallowing at home sometimes, will order SLP eval

## 2012-12-22 NOTE — Evaluation (Signed)
Occupational Therapy Evaluation Patient Details Name: [name] MRN: [id number] DOB: [birth year] Today's Date: 12/22/2012 Time: 1478-2956 OT Time Calculation (min): 22 min  OT Assessment / Plan / Recommendation History of present illness s/p TAVR due to severe aortic stenosis.  Pt has extensive arthritis and is 02 dependent at baseline. Hx of peripheral neuropathy. She experienced a fall just prior to admission injuring her coccyx.   Clinical Impression   Pt requires +2 assist for mobility and is significantly dependent in self care due to pain, decreased activity tolerance, and immobility.  Pt will need post acute rehab in a SNF.  Will defer OT to SNF.    OT Assessment  All further OT needs can be met in the next venue of care    Follow Up Recommendations  SNF    Barriers to Discharge      Equipment Recommendations  None recommended by OT    Recommendations for Other Services    Frequency       Precautions / Restrictions Precautions Precautions: Fall Restrictions Weight Bearing Restrictions: No   Pertinent Vitals/Pain Pain reported in coccyx, LEs, did not rate, repositioned in chair.    ADL  Eating/Feeding: NPO Grooming: Wash/dry hands;Wash/dry face;Set up Where Assessed - Grooming: Unsupported sitting Upper Body Bathing: Moderate assistance Where Assessed - Upper Body Bathing: Supported sitting Lower Body Bathing: +1 Total assistance Where Assessed - Lower Body Bathing: Supported sit to stand;Supported sitting Upper Body Dressing: Minimal assistance Where Assessed - Upper Body Dressing: Supported sitting Lower Body Dressing: +1 Total assistance Where Assessed - Lower Body Dressing: Supported sitting;Supported sit to Pharmacist, hospital: +2 Total assistance Toilet Transfer: Patient Percentage: 60% Statistician Method: Sit to stand Toileting - Clothing Manipulation and Hygiene: +1 Total assistance Where Assessed - Glass blower/designer Manipulation and  Hygiene: Standing Equipment Used: Gait belt;Rolling walker (02) Transfers/Ambulation Related to ADLs: ambulated with + 2 assist pt 60% with RW ADL Comments: Pt with limited mobility at baseline due to arthritis and gout combined with decreased activity tolerance.    OT Diagnosis: Generalized weakness;Acute pain  OT Problem List: Decreased strength;Decreased activity tolerance;Decreased range of motion;Impaired balance (sitting and/or standing);Decreased knowledge of use of DME or AE;Cardiopulmonary status limiting activity;Impaired sensation;Pain;Impaired UE functional use OT Treatment Interventions:     OT Goals(Current goals can be found in the care plan section) Acute Rehab OT Goals Patient Stated Goal: SNF for rehab prior to return home with son.  Visit Information  Last OT Received On: 12/22/12 Assistance Needed: +2 PT/OT Co-Evaluation/Treatment: Yes History of Present Illness: s/p TAVR due to severe aortic stenosis.  Pt has extensive arthritis and is 02 dependent at baseline. Hx of peripheral neuropathy. She experienced a fall just prior to admission injuring her coccyx.       Prior Functioning     Home Living Family/patient expects to be discharged to:: Skilled nursing facility Living Arrangements: Children Prior Function Level of Independence: Needs assistance Gait / Transfers Assistance Needed: used a cane, but has a walker ADL's / Homemaking Assistance Needed: Pt was able to perform bathing and dressing, but was very slow.  Her son did the cooking and cleaning.  Communication Communication: No difficulties Dominant Hand: Right         Vision/Perception Vision - History Patient Visual Report: No change from baseline   Cognition  Cognition Arousal/Alertness: Awake/alert Behavior During Therapy: WFL for tasks assessed/performed Overall Cognitive Status: History of cognitive impairments - at baseline (per chart review, pt has baseline dementia)  Extremity/Trunk Assessment Upper Extremity Assessment Upper Extremity Assessment: LUE deficits/detail;RUE deficits/detail RUE Deficits / Details: arthritic changes throughout hands LUE Deficits / Details: arthritic changes including baseline ROM limitations in shoulder Lower Extremity Assessment Lower Extremity Assessment: Defer to PT evaluation Cervical / Trunk Assessment Cervical / Trunk Assessment: Kyphotic     Mobility Bed Mobility Bed Mobility: Not assessed Transfers Transfers: Sit to Stand;Stand to Sit Sit to Stand: With upper extremity assist;From chair/3-in-1;1: +2 Total assist Sit to Stand: Patient Percentage: 50% Stand to Sit: 4: Min assist;With upper extremity assist Details for Transfer Assistance: Pt with limited ability to flex knees due to pain.     Exercise     Balance     End of Session OT - End of Session Activity Tolerance: Patient limited by fatigue;Patient limited by pain Patient left: in chair;with call bell/phone within reach Nurse Communication: Mobility status  GO     Evern Bio 12/22/2012, 12:54 PM [phone]

## 2012-12-22 NOTE — Progress Notes (Signed)
Nutrition Brief Note  Patient identified on the Malnutrition Screening Tool (MST) Report. Pt reported that she was unsure if she had lost weight. Per chart review, weights have been stable, see below:  Wt Readings from Last 15 Encounters:  12/22/12 166 lb 14.2 oz (75.7 kg)  12/22/12 166 lb 14.2 oz (75.7 kg)  12/16/12 158 lb 1.6 oz (71.714 kg)  12/07/12 157 lb (71.215 kg)  12/06/12 157 lb (71.215 kg)  11/10/12 155 lb (70.308 kg)  11/02/12 155 lb (70.308 kg)  10/12/12 157 lb 12.8 oz (71.578 kg)  09/21/12 154 lb (69.854 kg)  09/24/11 154 lb (69.854 kg)  10/15/10 148 lb (67.132 kg)    Body mass index is 34.89 kg/(m^2). Patient meets criteria for Obese Class I based on current BMI.   Current diet order is NPO. Labs and medications reviewed.   No nutrition interventions warranted at this time. If nutrition issues arise, please consult RD.   Jarold Motto MS, RD, LDN Pager: [phone] After-hours pager: [phone]

## 2012-12-22 NOTE — Progress Notes (Addendum)
Speech Language Pathology  Patient Details Name: [name] MRN: [id number] DOB: 1933/02/08 Today's Date: 12/22/2012 Time:   ADDENDUM:  FEES EQUIPMENT IS WORKING AND FEES COMPLETED 9/3 AFTERNOON  SLP just notified that [employer] is working on FEES equipment which will not be available today.  Do not feel she is appropriate to leave room for MBS today (decreased endurance and multiple lines).  Will plan on MBS tomorrow if pt. Able.  If MD agrees, pt. Could have several ice chips after oral care.  Do not recommend giving po meds (IV at present time)  Darrow Bussing.Ed [employer] [phone]  12/22/2012

## 2012-12-22 NOTE — Evaluation (Signed)
Physical Therapy Evaluation Patient Details Name: [name] MRN: [id number] DOB: [birth year] Today's Date: 12/22/2012 Time: 6578-4696 PT Time Calculation (min): 24 min  PT Assessment / Plan / Recommendation History of Present Illness  79 y.o. female s/p TAVR due to severe aortic stenosis.  Pt has extensive arthritis and is 02 dependent at baseline. Hx of peripheral neuropathy. She experienced a fall just prior to admission injuring her coccyx.  Clinical Impression  Patient demonstrates deficits in functional mobility as indicated below. Patient will benefit from continued skilled PT to address deficits and maximize function. Rec dc to SNF when medically able. Will continue to see as indicated and progress activity as tolerated.     PT Assessment  Patient needs continued PT services    Follow Up Recommendations  SNF       Barriers to Discharge Decreased caregiver support son is s/;p hernia repair    Equipment Recommendations  None recommended by PT    Recommendations for Other Services     Frequency Min 3X/week    Precautions / Restrictions Precautions Precautions: Fall Restrictions Weight Bearing Restrictions: No   Pertinent Vitals/Pain VSS on 4 liters supplemental O2      Mobility  Bed Mobility Bed Mobility: Not assessed Transfers Transfers: Sit to Stand;Stand to Sit Sit to Stand: With upper extremity assist;From chair/3-in-1;1: +2 Total assist Sit to Stand: Patient Percentage: 50% Stand to Sit: 4: Min assist;With upper extremity assist Details for Transfer Assistance: Pt with limited ability to flex knees due to pain. Ambulation/Gait Ambulation/Gait Assistance: 1: +2 Total assist Ambulation/Gait: Patient Percentage: 60% Ambulation Distance (Feet): 36 Feet Assistive device: Rolling walker Ambulation/Gait Assistance Details: assist for stability Gait Pattern: Step-to pattern;Decreased stride length;Shuffle;Trunk flexed;Narrow base of support Gait velocity:  decreased General Gait Details: some instability noted with use of rw        PT Diagnosis: Difficulty walking;Abnormality of gait;Generalized weakness;Acute pain  PT Problem List: Decreased strength;Decreased range of motion;Decreased activity tolerance;Decreased balance;Decreased mobility;Decreased safety awareness;Pain PT Treatment Interventions: DME instruction;Gait training;Stair training;Functional mobility training;Therapeutic activities;Therapeutic exercise;Balance training;Patient/family education     PT Goals(Current goals can be found in the care plan section) Acute Rehab PT Goals Patient Stated Goal: SNF for rehab prior to return home with son. PT Goal Formulation: With patient Time For Goal Achievement: 01/05/13 Potential to Achieve Goals: Good  Visit Information  Last PT Received On: 12/22/12 Assistance Needed: +2 History of Present Illness: 79 y.o. female s/p TAVR due to severe aortic stenosis.  Pt has extensive arthritis and is 02 dependent at baseline. Hx of peripheral neuropathy. She experienced a fall just prior to admission injuring her coccyx.       Prior Functioning  Home Living Family/patient expects to be discharged to:: Skilled nursing facility Living Arrangements: Children Prior Function Level of Independence: Needs assistance Gait / Transfers Assistance Needed: used a cane, but has a walker ADL's / Homemaking Assistance Needed: Pt was able to perform bathing and dressing, but was very slow.  Her son did the cooking and cleaning.  Communication Communication: No difficulties Dominant Hand: Right    Cognition  Cognition Arousal/Alertness: Awake/alert Behavior During Therapy: WFL for tasks assessed/performed Overall Cognitive Status: History of cognitive impairments - at baseline (per chart review, pt has baseline dementia)    Extremity/Trunk Assessment Upper Extremity Assessment Upper Extremity Assessment: Defer to OT evaluation RUE Deficits /  Details: arthritic changes throughout hands LUE Deficits / Details: arthritic changes including baseline ROM limitations in shoulder Lower Extremity Assessment Lower Extremity  Assessment: Generalized weakness;RLE deficits/detail;LLE deficits/detail RLE Deficits / Details: arthritic changes LLE Deficits / Details: arthritic changes Cervical / Trunk Assessment Cervical / Trunk Assessment: Kyphotic   Balance Balance Balance Assessed: Yes Static Sitting Balance Static Sitting - Balance Support: Feet supported Static Sitting - Level of Assistance: 5: Stand by assistance Static Sitting - Comment/# of Minutes: 6 minutes, activities edge of chair High Level Balance High Level Balance Activites: Side stepping;Turns High Level Balance Comments: min assist  End of Session PT - End of Session Equipment Utilized During Treatment: Gait belt;Oxygen Activity Tolerance: Patient limited by fatigue;Patient limited by pain Patient left: in chair;with call bell/phone within reach Nurse Communication: Mobility status  GP     Fabio Asa 12/22/2012, 1:19 PM  Charlotte Crumb, PT DPT  [phone]

## 2012-12-22 NOTE — Progress Notes (Signed)
TCTS p.m. Rounds  The patient resting comfortably  Lungs clear Was out of bed earlier today  Patient examined and record reviewed.Hemodynamics stable,labs satisfactory.Patient had stable day.Continue current care. VAN TRIGT III,Patrick Sohm 12/22/2012

## 2012-12-22 NOTE — Progress Notes (Signed)
R IJ introducer was removed per Dr. Donata Clay,  Patient was laid flat, had patient turn her head to left side, the old dressing and sutures were removed, betadine was applied to the site, occlusive dressing was prepared and was applied over the site.  Patient was asked to take a deep breath in and hold that breath, introducer was removed, patient exhaled and pressure was applied for several minutes. Patient tolerated well and stated she felt better.

## 2012-12-22 NOTE — Progress Notes (Addendum)
During hourly rounds, patient had pulled off the dressing that was covering her R IJ introducer site.  Occlusive dressing was reapplied, patient was informed the importance of not touching lines or dressings. [employer] were applied.  Will closely monitor patient as possible.

## 2012-12-22 NOTE — Progress Notes (Signed)
Left femoral/groin site noted to be more reddened and edematous with scant amount of serosanguinous fluid noted coming from a newly assessed small open area to top corner of incision site.   Patient complains of increasing pain and warmth at site. Good palpable pulses noted to LLE.  Dr. Donata Clay (on-call MD) telephoned and made aware.  Small sterile dry dressing placed over site and ice pack applied for comfort.  No new orders received at this time.

## 2012-12-23 ENCOUNTER — Inpatient Hospital Stay (HOSPITAL_COMMUNITY): Payer: PRIVATE HEALTH INSURANCE

## 2012-12-23 LAB — CBC
HCT: 28.9 % — ABNORMAL LOW (ref 36.0–46.0)
Hemoglobin: 9.6 g/dL — ABNORMAL LOW (ref 12.0–15.0)
MCV: 95.4 fL (ref 78.0–100.0)
RDW: 15.7 % — ABNORMAL HIGH (ref 11.5–15.5)
WBC: 7.4 10*3/uL (ref 4.0–10.5)

## 2012-12-23 LAB — BASIC METABOLIC PANEL
BUN: 20 mg/dL (ref 6–23)
CO2: 26 mEq/L (ref 19–32)
Chloride: 111 mEq/L (ref 96–112)
Creatinine, Ser: 1.5 mg/dL — ABNORMAL HIGH (ref 0.50–1.10)
GFR calc Af Amer: 37 mL/min — ABNORMAL LOW (ref >90)
Potassium: 4.1 mEq/L (ref 3.5–5.1)

## 2012-12-23 LAB — GLUCOSE, CAPILLARY
Glucose-Capillary: 99 mg/dL (ref 70–99)
Glucose-Capillary: 137 mg/dL — ABNORMAL HIGH (ref 70–99)
Glucose-Capillary: 96 mg/dL (ref 70–99)
Glucose-Capillary: 116 mg/dL — ABNORMAL HIGH (ref 70–99)

## 2012-12-23 LAB — POCT I-STAT, CHEM 8
BUN: 18 mg/dL (ref 6–23)
Calcium, Ion: 1.23 mmol/L (ref 1.13–1.30)
Chloride: 108 mEq/L (ref 96–112)
HCT: 32 % — ABNORMAL LOW (ref 36.0–46.0)
Potassium: 4 mEq/L (ref 3.5–5.1)

## 2012-12-23 MED ORDER — SODIUM CHLORIDE 0.9 % IV SOLN: 250.0000 mL | INTRAVENOUS | Status: DC | PRN

## 2012-12-23 MED ORDER — MOVING RIGHT ALONG BOOK
Freq: Once | Status: AC
  Administered 2012-12-23: 1
  Filled 2012-12-23: qty 1

## 2012-12-23 MED ORDER — SODIUM CHLORIDE 0.9 % IJ SOLN
3.0000 mL | Freq: Two times a day (BID) | INTRAMUSCULAR | Status: DC
  Administered 2012-12-23 – 2012-12-27 (×10): 3 mL via INTRAVENOUS

## 2012-12-23 MED ORDER — SODIUM CHLORIDE 0.9 % IJ SOLN: 3.0000 mL | INTRAMUSCULAR | Status: DC | PRN

## 2012-12-23 MED FILL — Dextrose Inj 5%: INTRAVENOUS | Qty: 250 | Status: AC

## 2012-12-23 MED FILL — Heparin Sodium (Porcine) Inj 1000 Unit/ML: INTRAMUSCULAR | Qty: 30 | Status: AC

## 2012-12-23 MED FILL — Sodium Chloride IV Soln 0.9%: INTRAVENOUS | Qty: 1000 | Status: AC

## 2012-12-23 MED FILL — Potassium Chloride Inj 2 mEq/ML: INTRAVENOUS | Qty: 40 | Status: AC

## 2012-12-23 MED FILL — Magnesium Sulfate Inj 50%: INTRAMUSCULAR | Qty: 10 | Status: AC

## 2012-12-23 MED FILL — Norepinephrine Bitartrate IV Soln 1 MG/ML (Base Equivalent): INTRAMUSCULAR | Qty: 8 | Status: AC

## 2012-12-23 NOTE — Progress Notes (Signed)
Transferred from chair to bed with 4 person assist. Still weak on lower extremities and unable to stand. Transferred to 2W24 via bed and received by RN. Report given prior to coming to the unit. No untoward event happened during transport.

## 2012-12-23 NOTE — Progress Notes (Signed)
Dr. Cornelius Moras at bedside and made aware of Left Groin/femoral site. Dr. Cornelius Moras has assessed area.  Area cleansed and Steristrips applied to area as ordered.

## 2012-12-23 NOTE — Progress Notes (Signed)
Reported off to oncoming RN. VSS. Safety maintained. No acute distress noted.

## 2012-12-23 NOTE — Progress Notes (Signed)
SUBJECTIVE: Breathing better. Pain in left groin surgical site. No chest pain.   BP 149/124  Pulse 61  Temp(Src) 99.2 F (37.3 C) (Oral)  Resp 20  Ht 4\' 10"  (1.473 m)  Wt 156 lb 1.4 oz (70.8 kg)  BMI 32.63 kg/m2  SpO2 100%  Intake/Output Summary (Last 24 hours) at 12/23/12 0709 Last data filed at 12/23/12 0600  Gross per 24 hour  Intake    550 ml  Output   2475 ml  Net  -1925 ml    PHYSICAL EXAM General: Well developed, well nourished, in no acute distress. Alert and oriented x 3.  Psych:  Good affect, responds appropriately Neck: No JVD. No masses noted.  Lungs: Clear bilaterally with no wheezes or rhonci noted.  Heart: RRR with no murmurs noted. Abdomen: Bowel sounds are present. Soft, non-tender.  Extremities: No lower extremity edema. Left groin surgical site with serosanguinous drainage, wound slightly open on superior aspect. Right groin cath site stable without hematoma  LABS: Basic Metabolic Panel:  Recent Labs  16/10/96 0315 12/22/12 1700 12/23/12 0320  NA 142  --  143  K 4.5  --  4.1  CL 112  --  111  CO2 24  --  26  GLUCOSE 108*  --  118*  BUN 21  --  20  CREATININE 1.30* 1.33* 1.50*  CALCIUM 8.1*  --  8.2*  MG 1.8 1.8  --    CBC:  Recent Labs  12/22/12 1700 12/23/12 0320  WBC 7.6 7.4  HGB 10.2* 9.6*  HCT 30.5* 28.9*  MCV 93.8 95.4  PLT 115* 94*   Current Meds: . amiodarone  100 mg Oral Daily  . aspirin EC  325 mg Oral Daily  . bisacodyl  10 mg Oral Daily   Or  . bisacodyl  10 mg Rectal Daily  . carvedilol  12.5 mg Oral BID WC  . ceFEPime (MAXIPIME) IV  1 g Intravenous Q24H  . colchicine  0.6 mg Oral QODAY  . docusate sodium  200 mg Oral Daily  . donepezil  10 mg Oral QHS  . DULoxetine  60 mg Oral Daily  . enoxaparin (LOVENOX) injection  30 mg Subcutaneous QHS  . febuxostat  40 mg Oral Daily  . guaiFENesin  600 mg Oral BID  . hydrALAZINE  10 mg Oral Q8H  . insulin aspart  0-24 Units Subcutaneous Q4H  . insulin aspart  0-24  Units Subcutaneous Q4H  . insulin regular  0-10 Units Intravenous TID WC  . irbesartan  75 mg Oral Daily  . levothyroxine  100 mcg Oral QAC breakfast  . loratadine  10 mg Oral Daily  . magnesium sulfate  4 g Intravenous Once  . memantine  10 mg Oral BID  . mupirocin ointment   Topical BID  . pantoprazole  40 mg Oral Daily  . pregabalin  75 mg Oral BID  . simvastatin  10 mg Oral q1800  . sodium chloride  3 mL Intravenous Q12H   Echo 12/23/12: Left ventricle: The cavity size was mildly dilated. Wall thickness was normal. Systolic function was moderately reduced. The estimated ejection fraction was in the range of 35% to 40%. Mild hypokinesis of the basalanteroseptal myocardium. - Aortic valve: Valve area: 2.4cm^2(VTI). Valve area: 2.52cm^2 (Vmax). - Mitral valve: Mild regurgitation. - Left atrium: The atrium was mildly to moderately dilated. - Pulmonary arteries: PA peak pressure: 37mm Hg (S).   ASSESSMENT AND PLAN:  1. Severe aortic valve stenosis:  s/p TAVR procedure 12/21/12. Doing well this am. Echo yesterday with no changes. Valve functioning well. No AI.   2. HTN: BP stable  3. Post-op fever/non-productive cough post-extubation: Possible aspiration. Afebrile x 24 hours on cefepime (day 2).  CXR pending this am.   4. Atrial fibrillation: Will plan on resuming coumadin after it is clear that her surgical site is stable.   5. Left groin surgical wound: will ask surgery to evaluate.   Transfer to telemetry unit. Cardiac surgery progression. Continue PT/OT.       Tucker Steedley  9/4/20147:09 AM

## 2012-12-23 NOTE — Progress Notes (Addendum)
      [address]       [place] [address]             [phone]      2 Days Post-Op Procedure(s) (LRB): TRANSCATHETER AORTIC VALVE REPLACEMENT, TRANSFEMORAL (N/A) INTRAOPERATIVE TRANSESOPHAGEAL ECHOCARDIOGRAM (N/A) INSERTION CENTRAL LINE ADULT (Left)  Subjective:  [name] complains of pain in her groin incision and itching this morning.  Objective: Vital signs in last 24 hours: Temp:  [97.7 F (36.5 C)-100.9 F (38.3 C)] 98.7 F (37.1 C) (09/04 0744) Pulse Rate:  [58-88] 61 (09/04 0700) Cardiac Rhythm:  [-] A-V Sequential paced (09/03 1930) Resp:  [12-53] 53 (09/04 0700) BP: (92-149)/(33-124) 128/67 mmHg (09/04 0700) SpO2:  [91 %-100 %] 100 % (09/04 0700) Arterial Line BP: (149-173)/(53-64) 173/64 mmHg (09/03 1100) Weight:  [156 lb 1.4 oz (70.8 kg)] 156 lb 1.4 oz (70.8 kg) (09/04 0600)  Hemodynamic parameters for last 24 hours: PAP: (50)/(27) 50/27 mmHg  Intake/Output from previous day: 09/03 0701 - 09/04 0700 In: 550 [I.V.:400; IV Piggyback:150] Out: 2475 [Urine:2475]  General appearance: alert, cooperative and no distress Heart: regular rate and rhythm Lungs: clear to auscultation bilaterally Abdomen: soft, non-tender; bowel sounds normal; no masses,  no organomegaly Extremities: extremities normal, atraumatic, no cyanosis or edema Wound: Left groin incision has mild dehiscence of superior portion, no evidence of infection present  Lab Results:  Recent Labs  12/22/12 1700 12/23/12 0320  WBC 7.6 7.4  HGB 10.2* 9.6*  HCT 30.5* 28.9*  PLT 115* 94*   BMET:  Recent Labs  12/22/12 0315 12/22/12 1700 12/23/12 0320  NA 142  --  143  K 4.5  --  4.1  CL 112  --  111  CO2 24  --  26  GLUCOSE 108*  --  118*  BUN 21  --  20  CREATININE 1.30* 1.33* 1.50*  CALCIUM 8.1*  --  8.2*    PT/INR:  Recent Labs  12/21/12 1609  LABPROT 16.3*  INR 1.34   ABG    Component Value Date/Time   PHART 7.305* 12/21/2012 2342   HCO3 23.1  12/21/2012 2342   TCO2 24 12/21/2012 2342   ACIDBASEDEF 3.0* 12/21/2012 2342   O2SAT 98.0 12/21/2012 2342   CBG (last 3)   Recent Labs  12/22/12 1954 12/22/12 2313 12/23/12 0414  GLUCAP 127* 109* 99    Assessment/Plan: S/P Procedure(s) (LRB): TRANSCATHETER AORTIC VALVE REPLACEMENT, TRANSFEMORAL (N/A) INTRAOPERATIVE TRANSESOPHAGEAL ECHOCARDIOGRAM (N/A) INSERTION CENTRAL LINE ADULT (Left)  1. S/P TAVR doing well 2. CV- HTN, blood pressure stable, medicines are managed via Cardiolgoy 3. Left Groin dehiscence superior portion- no evidence of wound infection, keep wound clean and dry, apply steri strips 4. Atrial Fibrillation- h/o coumadin use, should be okay to use from surgical standpoint 5. Dispo- patient stable, placed transfer orders per Cardiology   LOS: 2 days    BARRETT, ERIN 12/23/2012  I have seen and examined the patient and agree with the assessment and plan as outlined.  Looks much better today.  Breathing better and cough much improved.  CXR improved.  Will place suture in groin incision.  Candy Leverett H 12/23/2012 9:27 AM

## 2012-12-23 NOTE — Progress Notes (Signed)
Speech Language Pathology Dysphagia Treatment Patient Details Name: [name] MRN: [id number] DOB: 1932/12/25 Today's Date: 12/23/2012 Time: 4098-1191 SLP Time Calculation (min): 20 min  Assessment / Plan / Recommendation Clinical Impression  Follow up after FEES yesterday.  Pt. up in chair and consumed puree texture (dentures not in hospital) and thin water via straw, coffee via cup.  One immediate cough following larger sip via straw.  Pt. needed moderate verbal cues for smaller sips with clinical rationale and verbal and written cues for alternate liquids and solids.  No baseline coughing this morning.  She appears to be tolerating diet texture and liquids.  Will continue to follow for safety with po's, strategies and diet texture upgrade if dentures brought in  (pt. reported daughter may not be able to bring dentures in until Fri or Sat.    Diet Recommendation  Continue with Current Diet: Dysphagia 1 (puree);Thin liquid    SLP Plan Continue with current plan of care   Pertinent Vitals/Pain none   Swallowing Goals  SLP Swallowing Goals Patient will utilize recommended strategies during swallow to increase swallowing safety with: Supervision/safety Swallow Study Goal #2 - Progress: Progressing toward goal  General Temperature Spikes Noted: Yes Respiratory Status: Supplemental O2 delivered via (comment) Behavior/Cognition: Alert;Cooperative;Pleasant mood Oral Cavity - Dentition: Edentulous (dentures not here) Patient Positioning: Upright in chair  Oral Cavity - Oral Hygiene Does patient have any of the following "at risk" factors?: Oxygen therapy - cannula, mask, simple oxygen devices Brush patient's teeth BID with toothbrush (using toothpaste with fluoride): Yes Patient is AT RISK - Oral Care Protocol followed (see row info): Yes   Dysphagia Treatment Treatment focused on: Skilled observation of diet tolerance Treatment Methods/Modalities: Skilled observation Patient  observed directly with PO's: Yes Type of PO's observed: Dysphagia 1 (puree);Thin liquids Feeding: Able to feed self;Needs assist;Needs set up Liquids provided via: Cup;Straw Pharyngeal Phase Signs & Symptoms: Immediate cough Type of cueing: Verbal Amount of cueing: Moderate        Breck Coons [place] [phone]  12/23/2012

## 2012-12-23 NOTE — Progress Notes (Signed)
Dr. Clifton James, and Cardiothoracic surgery PA, made aware of left femoral/groin incision site that is red, painful, swollen and open at top portion of incision with small amount of serosanguinous drainage noted.  Dr. Clifton James at bedside assessing left femoral/groin site.  No new orders noted at this time.  Ice bag remains at site for comfort.

## 2012-12-24 ENCOUNTER — Encounter (HOSPITAL_COMMUNITY): Payer: Self-pay | Admitting: Physician Assistant

## 2012-12-24 DIAGNOSIS — I5043 Acute on chronic combined systolic (congestive) and diastolic (congestive) heart failure: Secondary | ICD-10-CM

## 2012-12-24 LAB — BASIC METABOLIC PANEL
Calcium: 8.7 mg/dL (ref 8.4–10.5)
Creatinine, Ser: 1.47 mg/dL — ABNORMAL HIGH (ref 0.50–1.10)
GFR calc Af Amer: 38 mL/min — ABNORMAL LOW (ref >90)
GFR calc non Af Amer: 33 mL/min — ABNORMAL LOW (ref >90)
Sodium: 140 mEq/L (ref 135–145)

## 2012-12-24 LAB — CBC
MCV: 96.8 fL (ref 78.0–100.0)
Platelets: 75 10*3/uL — ABNORMAL LOW (ref 150–400)
RBC: 3.13 MIL/uL — ABNORMAL LOW (ref 3.87–5.11)
RDW: 15.7 % — ABNORMAL HIGH (ref 11.5–15.5)
WBC: 6.6 10*3/uL (ref 4.0–10.5)

## 2012-12-24 LAB — OCCULT BLOOD X 1 CARD TO LAB, STOOL: Fecal Occult Bld: NEGATIVE

## 2012-12-24 LAB — PROTIME-INR: INR: 1.2 (ref 0.00–1.49)

## 2012-12-24 LAB — GLUCOSE, CAPILLARY
Glucose-Capillary: 133 mg/dL — ABNORMAL HIGH (ref 70–99)
Glucose-Capillary: 133 mg/dL — ABNORMAL HIGH (ref 70–99)
Glucose-Capillary: 119 mg/dL — ABNORMAL HIGH (ref 70–99)

## 2012-12-24 MED ORDER — TRAMADOL HCL 50 MG PO TABS: 100.0000 mg | ORAL_TABLET | Freq: Four times a day (QID) | ORAL | Status: DC | PRN

## 2012-12-24 MED ORDER — WARFARIN SODIUM 3 MG PO TABS
3.0000 mg | ORAL_TABLET | Freq: Once | ORAL | Status: AC
  Administered 2012-12-24: 3 mg via ORAL
  Filled 2012-12-24: qty 1

## 2012-12-24 MED ORDER — TRAMADOL HCL 50 MG PO TABS: 50.0000 mg | ORAL_TABLET | Freq: Four times a day (QID) | ORAL | Status: DC | PRN

## 2012-12-24 MED ORDER — TRAMADOL HCL 50 MG PO TABS
50.0000 mg | ORAL_TABLET | Freq: Four times a day (QID) | ORAL | Status: DC | PRN
  Administered 2012-12-24: 50 mg via ORAL
  Administered 2012-12-24: 100 mg via ORAL
  Filled 2012-12-24: qty 2
  Filled 2012-12-24: qty 1

## 2012-12-24 MED ORDER — TORSEMIDE 20 MG PO TABS
20.0000 mg | ORAL_TABLET | Freq: Every day | ORAL | Status: DC
  Administered 2012-12-24 – 2012-12-25 (×2): 20 mg via ORAL
  Filled 2012-12-24 (×2): qty 1

## 2012-12-24 MED ORDER — WARFARIN - PHARMACIST DOSING INPATIENT
Freq: Every day | Status: DC
  Administered 2012-12-24: 18:00:00

## 2012-12-24 MED ORDER — ASPIRIN EC 81 MG PO TBEC
81.0000 mg | DELAYED_RELEASE_TABLET | Freq: Every day | ORAL | Status: DC
  Administered 2012-12-24 – 2012-12-28 (×5): 81 mg via ORAL
  Filled 2012-12-24 (×6): qty 1

## 2012-12-24 NOTE — Progress Notes (Signed)
Patient: [name] / Admit Date: 12/21/2012 / Date of Encounter: 12/24/2012, 8:36 AM   Subjective  C/o bilateral leg pain, tender to any touch. No CP or SOB.   Objective   Telemetry: NSR, occ AV pacing Physical Exam: Filed Vitals:   12/24/12 0752  BP: 115/40  Pulse: 75  Temp: 98.7  Resp: 20  98% [place] [facility]: Well developed, chronically ill appearing WF in no acute distress. Head: Normocephalic, atraumatic, sclera non-icteric, no xanthomas, nares are without discharge. Neck: JVD not elevated. Lungs: Clear bilaterally to auscultation without wheezes, rales, or rhonchi. Breathing is unlabored. Heart: RRR S1 S2 without rubs or gallops. Abdomen: Soft, non-tender, non-distended with normoactive bowel sounds. No hepatomegaly. No rebound/guarding. No obvious abdominal masses. Msk:  Strength and tone appear normal for age. Extremities: No clubbing or cyanosis. No edema. L groin site surgical site stable, R groin cath site without hematoma (mild ecchymosis). Bilateral LE are tender to the touch even with the mildest tap, pt responds by pulling away. Neuro: Alert and oriented X 3. Moves all extremities spontaneously. Psych:  Responds to questions appropriately with a normal affect.    Intake/Output Summary (Last 24 hours) at 12/24/12 0836 Last data filed at 12/24/12 1610  Gross per 24 hour  Intake    650 ml  Output    470 ml  Net    180 ml    Inpatient Medications:  . amiodarone  100 mg Oral Daily  . aspirin EC  325 mg Oral Daily  . bisacodyl  10 mg Oral Daily   Or  . bisacodyl  10 mg Rectal Daily  . carvedilol  12.5 mg Oral BID WC  . ceFEPime (MAXIPIME) IV  1 g Intravenous Q24H  . colchicine  0.6 mg Oral QODAY  . docusate sodium  200 mg Oral Daily  . donepezil  10 mg Oral QHS  . DULoxetine  60 mg Oral Daily  . enoxaparin (LOVENOX) injection  30 mg Subcutaneous QHS  . febuxostat  40 mg Oral Daily  . guaiFENesin  600 mg Oral BID  . hydrALAZINE  10 mg Oral Q8H  . insulin  aspart  0-24 Units Subcutaneous Q4H  . irbesartan  75 mg Oral Daily  . levothyroxine  100 mcg Oral QAC breakfast  . loratadine  10 mg Oral Daily  . memantine  10 mg Oral BID  . mupirocin ointment   Topical BID  . pantoprazole  40 mg Oral Daily  . pregabalin  75 mg Oral BID  . simvastatin  10 mg Oral q1800  . sodium chloride  3 mL Intravenous Q12H    Labs:  Recent Labs  12/22/12 0315 12/22/12 1700  12/23/12 0320 12/24/12 0535  NA 142  --   < > 143 140  K 4.5  --   < > 4.1 4.2  CL 112  --   < > 111 108  CO2 24  --   --  26 25  GLUCOSE 108*  --   < > 118* 117*  BUN 21  --   < > 20 21  CREATININE 1.30* 1.33*  < > 1.50* 1.47*  CALCIUM 8.1*  --   --  8.2* 8.7  MG 1.8 1.8  --   --   --   < > = values in this interval not displayed. No results found for this basename: AST, ALT, ALKPHOS, BILITOT, PROT, ALBUMIN,  in the last 72 hours  Recent Labs  12/23/12 0320 12/24/12 0535  WBC 7.4 6.6  HGB 9.6* 9.8*  HCT 28.9* 30.3*  MCV 95.4 96.8  PLT 94* 75*   Radiology/Studies:   Dg Chest Port 1 View9/07/2012   CLINICAL DATA:  Postop from transcatheter aortic valve replacement.  EXAM: PORTABLE CHEST - 1 VIEW  COMPARISON:  12/22/2012  FINDINGS: [name]-Ganz catheter has been removed. Left subclavian central venous catheter remains in appropriate position. No pneumothorax identified.  Improved aeration of both lungs is seen with decrease bibasilar atelectasis. Cardiomegaly is stable. No evidence of congestive heart failure.  IMPRESSION: Decreased bibasilar atelectasis. Stable cardiomegaly.   Electronically Signed   By: [name]   On: 12/23/2012 07:49    Assessment and Plan  1. Severe aortic valve stenosis: s/p TAVR procedure 12/21/12. Echo stable post-op. Valve functioning well. No AI.   2. HTN: BP stable   3. Post-op fever/non-productive cough post-extubation: Possible aspiration. Afebrile x 48 hours on cefepime (day 3). CXR showing atelectasis which may have caused post-op fever. Will  defer d/c of abx to MD.  4. Atrial fibrillation: Maintaining NSR on amio. Will plan on resuming coumadin after it is clear that her surgical site stable - see #6.   5. Left groin surgical wound dehiscence: CVTS placed suture yesterday. Dr. Clifton James states pt has had LE pain at baseline (oxycodone ordered) - he feels OK to start Coumadin today if surgery feels comfortable with this.  6. Anemia/thrombocytopenia: Hgb relatively stable and likely related to surgery (ABL anemia) but Plt count continues to fall. (270 pre-op). Per discussion with Dr. Clifton James, this has been seen in post-TAVR patients and we will reduce ASA dose to 81mg . Will need to be followed closely if Coumadin is resumed.  7. Chronic systolic CHF: wts up but vol appears OK. Cont BB/ARB. Consider resumption of home torsemide.  8. CKD stage III: follow.  Signed, Jodi Spies PA-C  I have personally seen and examined this patient with Jodi Spies, PA-C. I agree with the assessment and plan as outlined above. She is progressing slowly post TAVR. Only c/o diffuse body aches which are chronic per patient. Hemodynamically stable. Will restart coumadin today. She is mildly volume overloaded. Will resume home dosage of torsemide. Continue progression with physical therapy. Continue current plan through weekend. Surgery will see this weekend to check on her surgical wound.   [name] 2:06 PM 12/24/2012

## 2012-12-24 NOTE — Progress Notes (Signed)
Brief Procedure Note:   Left femoral incision sutured with # 2 3-0 nylon sutures for superficial proximal skin dehiscence. 5 cc 2% lidocaine local Betadine prep Patient tolerated well  [name]

## 2012-12-24 NOTE — Progress Notes (Signed)
ANTICOAGULATION CONSULT NOTE - Initial Consult  Pharmacy Consult for Warfarin Indication: atrial fibrillation  Allergies  Allergen Reactions  . Codeine Hives  . Darvocet [Propoxyphene-Acetaminophen] Hives  . Nubain [Nalbuphine Hcl] Hives    Patient Measurements: Height: 4\' 10"  (147.3 cm) Weight: 163 lb 5.8 oz (74.1 kg) IBW/kg (Calculated) : 40.9 Heparin Dosing Weight: n/a  Vital Signs: Temp: 99.2 F (37.3 C) (09/05 1416) Temp src: Oral (09/05 1416) BP: 125/46 mmHg (09/05 1416) Pulse Rate: 75 (09/05 0752)  Labs:  Recent Labs  12/21/12 1609  12/22/12 1700 12/22/12 1705 12/23/12 0320 12/24/12 0535  HGB 10.3*  < > 10.2* 10.9* 9.6* 9.8*  HCT 29.1*  < > 30.5* 32.0* 28.9* 30.3*  PLT 136*  < > 115*  --  94* 75*  APTT 43*  --   --   --   --   --   LABPROT 16.3*  --   --   --   --   --   INR 1.34  --   --   --   --   --   CREATININE  --   < > 1.33* 1.50* 1.50* 1.47*  < > = values in this interval not displayed.  Estimated Creatinine Clearance: 27 ml/min (by C-G formula based on Cr of 1.47).   Medical History: Past Medical History  Diagnosis Date  . Syncope   . Symptomatic bradycardia   . Acute on chronic renal failure   . Chronic lower back pain   . Nonischemic cardiomyopathy   . HTN (hypertension)   . HLD (hyperlipidemia)   . Atrial fibrillation     tachy-brady syndrome with <1% recurrent PAF since pacemaker placement  . CKD (chronic kidney disease)   . H/O: stroke   . Pulmonary embolism     HISTORY OF, the pt. had a recurrent bilateral pulmonary emboli in 2005, on warfarin therapy and at which time she under went implantation of IVC filter  . Anemia   . Dementia   . Gastroesophageal reflux disease   . Aortic stenosis 10/13/2012    Low EF, low gradient with severe aortic stenosis confirmed by dobutamine stress echocardiogram   . PONV (postoperative nausea and vomiting)   . Osteoarthritis   . Neuropathy   . Spinal stenosis   . Sleep apnea     uses oxygen  at night and PRN  . S/P TAVR (transcatheter aortic valve replacement) 12/21/2012    29mm Edwards Sapien XT transcatheter heart valve placed via open left transfemoral approach  . Pacemaker   . Stroke   . CHF (congestive heart failure)   . Fibromyalgia     Medications:  Scheduled:  . amiodarone  100 mg Oral Daily  . aspirin EC  81 mg Oral Daily  . bisacodyl  10 mg Oral Daily   Or  . bisacodyl  10 mg Rectal Daily  . carvedilol  12.5 mg Oral BID WC  . ceFEPime (MAXIPIME) IV  1 g Intravenous Q24H  . colchicine  0.6 mg Oral QODAY  . docusate sodium  200 mg Oral Daily  . donepezil  10 mg Oral QHS  . DULoxetine  60 mg Oral Daily  . enoxaparin (LOVENOX) injection  30 mg Subcutaneous QHS  . febuxostat  40 mg Oral Daily  . guaiFENesin  600 mg Oral BID  . hydrALAZINE  10 mg Oral Q8H  . insulin aspart  0-24 Units Subcutaneous Q4H  . irbesartan  75 mg Oral Daily  . levothyroxine  100 mcg Oral QAC breakfast  . loratadine  10 mg Oral Daily  . memantine  10 mg Oral BID  . mupirocin ointment   Topical BID  . pantoprazole  40 mg Oral Daily  . pregabalin  75 mg Oral BID  . simvastatin  10 mg Oral q1800  . sodium chloride  3 mL Intravenous Q12H  . torsemide  20 mg Oral Daily  . warfarin  3 mg Oral ONCE-1800  . Warfarin - Pharmacist Dosing Inpatient   Does not apply q1800    Assessment: 79 yo female on chronic Coumadin for afib.  Coumadin has been on hold since admission while surgical issues were addressed.  Pharmacy asked to resume Coumadin today.  INR 9/2 was 1.34, no Coumadin given since then.  Goal of Therapy:  INR 2-3 Monitor platelets by anticoagulation protocol: Yes   Plan:  1. Coumadin 3 mg po x 1 tonight. 2. Check INR now and then daily. 3. Monitor for any signs of bleeding.  Tad Moore, BCPS  Clinical Pharmacist Pager [phone]  12/24/2012 2:25 PM

## 2012-12-24 NOTE — Progress Notes (Signed)
Clinical Social Work Department CLINICAL SOCIAL WORK PLACEMENT NOTE 12/24/2012  Patient:  [name], [name]  Account Number:  [id number] Admit date:  12/21/2012  Clinical Social Worker:  Carren Rang  Date/time:  12/24/2012 10:17 AM  Clinical Social Work is seeking post-discharge placement for this patient at the following level of care:   SKILLED NURSING   (*CSW will update this form in Epic as items are completed)   12/24/2012  Patient/family provided with [facility] Department of Clinical Social Work's list of facilities offering this level of care within the geographic area requested by the patient (or if unable, by the patient's family).  12/24/2012  Patient/family informed of their freedom to choose among providers that offer the needed level of care, that participate in Medicare, Medicaid or managed care program needed by the patient, have an available bed and are willing to accept the patient.  12/24/2012  Patient/family informed of [facility]' ownership interest in [facility], as well as of the fact that they are under no obligation to receive care at this facility.  PASARR submitted to EDS on 12/24/2012 PASARR number received from EDS on 12/24/2012  FL2 transmitted to all facilities in geographic area requested by pt/family on  12/24/2012 FL2 transmitted to all facilities within larger geographic area on   Patient informed that his/her managed care company has contracts with or will negotiate with  certain facilities, including the following:     Patient/family informed of bed offers received:   Patient chooses bed at  Physician recommends and patient chooses bed at    Patient to be transferred to  on   Patient to be transferred to facility by   The following physician request were entered in Epic:   Additional Comments:  Maree Krabbe, MSW, [employer] [phone]

## 2012-12-24 NOTE — Progress Notes (Signed)
Central line d/c at this time; manual pressure held to site; occlusive dressing applied; will cont. To monitor.

## 2012-12-24 NOTE — Progress Notes (Signed)
CSW went into room to speak to patient about dc planning. Patient was busy at the time. CSW will try again later.  Maree Krabbe, MSW, [facility] [phone]

## 2012-12-24 NOTE — Progress Notes (Addendum)
      [address]       [place] [address]             [phone]     CARDIOTHORACIC SURGERY PROGRESS NOTE  3 Days Post-Op  S/P Procedure(s) (LRB): TRANSCATHETER AORTIC VALVE REPLACEMENT, TRANSFEMORAL (N/A) INTRAOPERATIVE TRANSESOPHAGEAL ECHOCARDIOGRAM (N/A) INSERTION CENTRAL LINE ADULT (Left)  Subjective: Hurts all over.  Very weak.  All of this similar to pre-op  Objective: Vital signs in last 24 hours: Temp:  [98.1 F (36.7 C)-98.7 F (37.1 C)] 98.7 F (37.1 C) (09/05 0405) Pulse Rate:  [60-96] 75 (09/05 0752) Cardiac Rhythm:  [-] A-V Sequential paced (09/05 0802) Resp:  [19-43] 20 (09/05 0405) BP: (107-148)/(34-89) 115/40 mmHg (09/05 0752) SpO2:  [90 %-100 %] 99 % (09/05 0405) Weight:  [74.1 kg (163 lb 5.8 oz)] 74.1 kg (163 lb 5.8 oz) (09/05 0405)  Physical Exam:  Rhythm:  AV paced  Breath sounds: Diminished at bases  Heart sounds:  RRR  Incisions:  Slight erythema but no sign of infection, gap remains at top  Abdomen:  soft  Extremities:  warm   Intake/Output from previous day: 09/04 0701 - 09/05 0700 In: 650 [P.O.:600; IV Piggyback:50] Out: 500 [Urine:500] Intake/Output this shift: Total I/O In: 120 [P.O.:120] Out: -   Lab Results:  Recent Labs  12/23/12 0320 12/24/12 0535  WBC 7.4 6.6  HGB 9.6* 9.8*  HCT 28.9* 30.3*  PLT 94* 75*   BMET:  Recent Labs  12/23/12 0320 12/24/12 0535  NA 143 140  K 4.1 4.2  CL 111 108  CO2 26 25  GLUCOSE 118* 117*  BUN 20 21  CREATININE 1.50* 1.47*  CALCIUM 8.2* 8.7    CBG (last 3)   Recent Labs  12/24/12 0009 12/24/12 0357 12/24/12 0741  GLUCAP 129* 133* 133*   PT/INR:   Recent Labs  12/21/12 1609  LABPROT 16.3*  INR 1.34    CXR:  N/A  Assessment/Plan: S/P Procedure(s) (LRB): TRANSCATHETER AORTIC VALVE REPLACEMENT, TRANSFEMORAL (N/A) INTRAOPERATIVE TRANSESOPHAGEAL ECHOCARDIOGRAM (N/A) INSERTION CENTRAL LINE ADULT (Left)  Overall slow progress as expected Will place  suture in left groin incision Mobility will remain the biggest challenge I agree w/ resuming coumadin  OWEN,CLARENCE H 12/24/2012 10:20 AM

## 2012-12-24 NOTE — Progress Notes (Signed)
Clinical Social Work Department BRIEF PSYCHOSOCIAL ASSESSMENT 12/24/2012  Patient:  [name], [name]     Account Number:  [id number]     Admit date:  12/21/2012  Clinical Social Worker:  Carren Rang  Date/Time:  12/24/2012 10:16 AM  Referred by:  Care Management  Date Referred:  12/24/2012 Referred for  SNF Placement   Other Referral:   Interview type:  Patient Other interview type:    PSYCHOSOCIAL DATA Living Status:  ALONE Admitted from facility:   Level of care:   Primary support name:  Miah Boye [phone] Primary support relationship to patient:  CHILD, ADULT Degree of support available:   Good    CURRENT CONCERNS Current Concerns  Post-Acute Placement   Other Concerns:    SOCIAL WORK ASSESSMENT / PLAN Clinical Social Worker received referral for SNF placement at d/c. CSW introduced self and explained reason for visit. CSW explained SNF process to patient. Patient reported she is agreeable for SNF placement, but referred CSW to speak to son about dc plans. CSW will complete FL2 for MD's signature and will update patient and family when bed offers are received.   Assessment/plan status:  Psychosocial Support/Ongoing Assessment of Needs Other assessment/ plan:   Information/referral to community resources:   SNF INFORMATION    PATIENT'S/FAMILY'S RESPONSE TO PLAN OF CARE: Patient is agreeable for SNF placement and has referred CSW to son Maisee Vollman to help SNF process. CSW spoke to son via telephone. He is agreeable to be faxed out to [facility] and stated that patient has been to [facility] before and would like to return there if at all possible. CSW will pass this on to weekend CSW who will present bed offers to son.    Maree Krabbe, MSW, Theresia Majors [phone]

## 2012-12-24 NOTE — Plan of Care (Signed)
Problem: Phase III Progression Outcomes Goal: Transfer to PCTU/Telemetry POD Outcome: Completed/Met Date Met:  12/24/12 POD 2

## 2012-12-24 NOTE — Clinical Documentation Improvement (Signed)
THIS DOCUMENT IS NOT A PERMANENT PART OF THE MEDICAL RECORD  Please update your documentation with the medical record to reflect your response to this query. If you need help knowing how to do this please call [phone].  12/24/12  Dear Ronie Spies Marton Redwood  In an effort to better capture your patient's severity of illness, reflect appropriate length of stay and utilization of resources, a review of the patient medical record has revealed the following indicators.    Based on your clinical judgment, please clarify and document in a progress note and/or discharge summary the clinical condition associated with the following supporting information:    Possible Clinical Conditions?   " Expected Acute Blood Loss Anemia  " Acute Blood Loss Anemia  " Acute on chronic blood loss anemia  " Precipitous drop in Hematocrit  " Other Condition  " Cannot Clinically Determine    Supporting Information: EBL=250 ml per 9/02 Anesthesia record. Anemia/thrombocytopenia, hemoglobin relatively stable and likely related to surgery, noted per 9/05 progress notes.  Diagnostics: H&H on 8/28:  12.2/35.2 H&H on 9/02:   8.8/26.0  Transfusion: PRBC=350 ml per 9/02 Anesthesia record.  IV fluids / plasma expanders: Per 9/02 Anesthesia record: Albumin 5%: 250 ml. LR:  850 ml. 0.9% NaCl:  200 ml.   Reviewed: additional documentation in the medical record  Thank You,  Marciano Sequin,  Clinical Documentation Specialist: [phone] Health Information Management [place]

## 2012-12-24 NOTE — Progress Notes (Signed)
Physical Therapy Treatment Patient Details Name: [name] MRN: [id number] DOB: Feb 15, 1933 Today's Date: 12/24/2012 Time: 4098-1191 PT Time Calculation (min): 18 min  PT Assessment / Plan / Recommendation  History of Present Illness 79 y.o. female s/p TAVR due to severe aortic stenosis.  Pt has extensive arthritis and is 02 dependent at baseline. Hx of peripheral neuropathy. She experienced a fall just prior to admission injuring her coccyx.   PT Comments   Patient significantly limited compared to prior PT session. Patient with difficulty communicating/ word finding during session. Patient also unable to tolerate any activity in weight bearing. Pt complains of severe pain with wincing and withdrawal with bilateral LE (feet). At this time, patient needs at least +2 assist. Will continue to see patient and progress activity as tolerated. Addressed concerns regarding significant physical decline to nsg.  Follow Up Recommendations  SNF           Equipment Recommendations  None recommended by PT       Frequency Min 3X/week   Progress towards PT Goals Progress towards PT goals: Not progressing toward goals - comment (concerned for cognitive decline)  Plan Current plan remains appropriate    Precautions / Restrictions Precautions Precautions: Fall Restrictions Weight Bearing Restrictions: No   Pertinent Vitals/Pain Severe wincing, pain and withdrawl    Mobility  Bed Mobility Bed Mobility: Supine to Sit;Sitting - Scoot to Edge of Bed;Sit to Supine Supine to Sit: 1: +2 Total assist Supine to Sit: Patient Percentage: 20% Sitting - Scoot to Edge of Bed: 1: +2 Total assist Sitting - Scoot to Edge of Bed: Patient Percentage: 20% Sit to Supine: 1: +2 Total assist Sit to Supine: Patient Percentage: 0% Transfers Transfers:  (could not perform) Ambulation/Gait Ambulation/Gait Assistance:  (unable to perform)       PT Goals (current goals can now be found in the care plan  section) Acute Rehab PT Goals Patient Stated Goal: SNF for rehab prior to return home with son. PT Goal Formulation: With patient Time For Goal Achievement: 01/05/13 Potential to Achieve Goals: Fair  Visit Information  Last PT Received On: 12/24/12 Assistance Needed: +2 History of Present Illness: 79 y.o. female s/p TAVR due to severe aortic stenosis.  Pt has extensive arthritis and is 02 dependent at baseline. Hx of peripheral neuropathy. She experienced a fall just prior to admission injuring her coccyx.    Subjective Data  Patient Stated Goal: SNF for rehab prior to return home with son.   Cognition  Cognition Arousal/Alertness: Awake/alert Behavior During Therapy: WFL for tasks assessed/performed Overall Cognitive Status: History of cognitive impairments - at baseline (per chart review, pt has baseline dementia)    Balance  Static Sitting Balance Static Sitting - Balance Support: Feet supported Static Sitting - Level of Assistance: 3: Mod assist Static Sitting - Comment/# of Minutes: 4 minutes  End of Session PT - End of Session Equipment Utilized During Treatment: Gait belt Activity Tolerance: Patient limited by pain;Other (comment) (pt demonstrates congitive decline vs previous session) Patient left: in bed;with call bell/phone within reach;with nursing/sitter in room Nurse Communication: Mobility status;Other (comment) (AMS compared to prior session)   GP     Fabio Asa 12/24/2012, 1:45 PM Charlotte Crumb, PT DPT  [phone]

## 2012-12-25 ENCOUNTER — Inpatient Hospital Stay (HOSPITAL_COMMUNITY): Payer: PRIVATE HEALTH INSURANCE

## 2012-12-25 LAB — CBC
HCT: 31.2 % — ABNORMAL LOW (ref 36.0–46.0)
Hemoglobin: 10.3 g/dL — ABNORMAL LOW (ref 12.0–15.0)
RDW: 15.2 % (ref 11.5–15.5)
WBC: 7.6 10*3/uL (ref 4.0–10.5)

## 2012-12-25 LAB — TYPE AND SCREEN
Antibody Screen: NEGATIVE
Unit division: 0
Unit division: 0
Unit division: 0

## 2012-12-25 LAB — BASIC METABOLIC PANEL
Chloride: 106 mEq/L (ref 96–112)
Creatinine, Ser: 1.57 mg/dL — ABNORMAL HIGH (ref 0.50–1.10)
GFR calc Af Amer: 35 mL/min — ABNORMAL LOW (ref >90)
Potassium: 3.8 mEq/L (ref 3.5–5.1)

## 2012-12-25 LAB — GLUCOSE, CAPILLARY
Glucose-Capillary: 101 mg/dL — ABNORMAL HIGH (ref 70–99)
Glucose-Capillary: 106 mg/dL — ABNORMAL HIGH (ref 70–99)

## 2012-12-25 LAB — PROTIME-INR
INR: 1.13 (ref 0.00–1.49)
Prothrombin Time: 14.3 seconds (ref 11.6–15.2)

## 2012-12-25 MED ORDER — WARFARIN SODIUM 3 MG PO TABS
3.0000 mg | ORAL_TABLET | Freq: Once | ORAL | Status: AC
  Administered 2012-12-25: 3 mg via ORAL
  Filled 2012-12-25: qty 1

## 2012-12-25 NOTE — Progress Notes (Signed)
Subjective:  The patient denies any chest pain.  She is complaining of pain in both feet and in her lower back.  She states that she fell at home several days prior to coming to the hospital.  Because of her feet pain physical therapy has not been able to mobilize her as it is painful for her to stand up.  Objective:  Vital Signs in the last 24 hours: Temp:  [98.8 F (37.1 C)-99.2 F (37.3 C)] 98.9 F (37.2 C) (09/06 0351) Pulse Rate:  [61-79] 79 (09/06 0351) Resp:  [20] 20 (09/06 0351) BP: (116-136)/(44-74) 136/74 mmHg (09/06 0351) SpO2:  [96 %-100 %] 98 % (09/06 0351) Weight:  [163 lb 2.3 oz (74 kg)] 163 lb 2.3 oz (74 kg) (09/06 1610)  Intake/Output from previous day: 09/05 0701 - 09/06 0700 In: 350 [P.O.:300; IV Piggyback:50] Out: 300 [Urine:300] Intake/Output from this shift: Total I/O In: 120 [P.O.:120] Out: -   . amiodarone  100 mg Oral Daily  . aspirin EC  81 mg Oral Daily  . bisacodyl  10 mg Oral Daily   Or  . bisacodyl  10 mg Rectal Daily  . carvedilol  12.5 mg Oral BID WC  . ceFEPime (MAXIPIME) IV  1 g Intravenous Q24H  . colchicine  0.6 mg Oral QODAY  . docusate sodium  200 mg Oral Daily  . donepezil  10 mg Oral QHS  . DULoxetine  60 mg Oral Daily  . enoxaparin (LOVENOX) injection  30 mg Subcutaneous QHS  . febuxostat  40 mg Oral Daily  . guaiFENesin  600 mg Oral BID  . hydrALAZINE  10 mg Oral Q8H  . insulin aspart  0-24 Units Subcutaneous Q4H  . irbesartan  75 mg Oral Daily  . levothyroxine  100 mcg Oral QAC breakfast  . loratadine  10 mg Oral Daily  . memantine  10 mg Oral BID  . mupirocin ointment   Topical BID  . pantoprazole  40 mg Oral Daily  . pregabalin  75 mg Oral BID  . simvastatin  10 mg Oral q1800  . sodium chloride  3 mL Intravenous Q12H  . torsemide  20 mg Oral Daily  . warfarin  3 mg Oral ONCE-1800  . Warfarin - Pharmacist Dosing Inpatient   Does not apply q1800   . sodium chloride    . lactated ringers 20 mL/hr at 12/21/12 1600     Physical Exam: The patient appears to be in no distress.  Head and neck exam reveals that the pupils are equal and reactive.  The extraocular movements are full.  There is no scleral icterus.  Mouth and pharynx are benign.  No lymphadenopathy.  No carotid bruits.  The jugular venous pressure is normal.  Thyroid is not enlarged or tender.  Chest is clear to percussion and auscultation.  No rales or rhonchi.  Expansion of the chest is symmetrical.  Pacemaker in right upper chest.  Heart reveals no abnormal lift or heave.  First and second heart sounds are normal.  There is no murmur gallop rub or click.  No aortic insufficiency  The abdomen is soft and nontender.  Bowel sounds are normoactive.  There is no hepatosplenomegaly or mass.  There are no abdominal bruits.  The left groin incision appears to be healing well without significant ecchymosis and no evidence of infection.  Extremities reveal no phlebitis or edema.  Pedal pulses are strong in the right foot.  Left dorsalis pedis difficult to feel.  There is a weak posterior tibial on the left. There is no cyanosis or clubbing.  Neurologic exam is normal strength and no lateralizing weakness.  No sensory deficits.  Integument reveals no rash  Lab Results:  Recent Labs  12/24/12 0535 12/25/12 0500  WBC 6.6 7.6  HGB 9.8* 10.3*  PLT 75* 82*    Recent Labs  12/24/12 0535 12/25/12 0500  NA 140 143  K 4.2 3.8  CL 108 106  CO2 25 25  GLUCOSE 117* 138*  BUN 21 28*  CREATININE 1.47* 1.57*   No results found for this basename: TROPONINI, CK, MB,  in the last 72 hours Hepatic Function Panel No results found for this basename: PROT, ALBUMIN, AST, ALT, ALKPHOS, BILITOT, BILIDIR, IBILI,  in the last 72 hours No results found for this basename: CHOL,  in the last 72 hours No results found for this basename: PROTIME,  in the last 72 hours  Imaging: No results found.  Cardiac Studies: Telemetry shows atrial  pacing. Assessment/Plan:  . Severe aortic valve stenosis: s/p TAVR procedure 12/21/12. Echo stable post-op. Valve functioning well. No AI.  2. HTN: BP stable  3. Post-op fever/non-productive cough post-extubation: Possible aspiration. Afebrile x 48 hours on cefepime (day 3). CXR showing atelectasis which may have caused post-op fever.  Maximum temperature 99.2 yesterday.  Continue antibiotic today and consider stopping tomorrow. 4. Atrial fibrillation: Maintaining NSR on amio.  Coumadin has been resumed. 5. Left groin surgical wound dehiscence: CVTS placed suture yesterday. Dr. Clifton James states pt has had LE pain at baseline (oxycodone ordered) - he feels OK to start Coumadin today if surgery feels comfortable with this.  6. Anemia/thrombocytopenia: Hgb relatively stable and likely related to surgery (ABL anemia) but Plt count slightly higher today. Per discussion with Dr. Clifton James, this has been seen in post-TAVR patients and we will reduce ASA dose to 81mg .  7. Chronic systolic CHF: wts stable at 163 past 2 days. Cont BB/ARB.  8. CKD stage III: Kidney function slightly worse today with creatinine up to 1.57.  Will stop torsemide temporarily. 9. bilateral foot pain and low back pain: History of a fall at home several days prior to admission.  Will get x-rays of both feet and of her lumbar spine and sacrum.   LOS: 4 days    [name] 12/25/2012, 10:59 AM

## 2012-12-25 NOTE — Progress Notes (Signed)
Swelling noted in perineal area along with new bruising in Left groin area. Lumps felt underneath skin on Bilateral groin areas. MD paged, aware and at bedside. Vital signs stable. No new orders given at this time. Will continue to monitor.

## 2012-12-25 NOTE — Progress Notes (Addendum)
[address]       [place] [address]             [phone]      4 Days Post-Op  Procedure(s) (LRB): TRANSCATHETER AORTIC VALVE REPLACEMENT, TRANSFEMORAL (N/A) INTRAOPERATIVE TRANSESOPHAGEAL ECHOCARDIOGRAM (N/A) INSERTION CENTRAL LINE ADULT (Left) Subjective: A little more confused this am  Objective  Telemetry sinus rhythm  Temp:  [98.8 F (37.1 C)-99.2 F (37.3 C)] 98.9 F (37.2 C) (09/06 0351) Pulse Rate:  [61-79] 79 (09/06 0351) Resp:  [20] 20 (09/06 0351) BP: (116-136)/(44-74) 136/74 mmHg (09/06 0351) SpO2:  [96 %-100 %] 98 % (09/06 0351) Weight:  [163 lb 2.3 oz (74 kg)] 163 lb 2.3 oz (74 kg) (09/06 1308)   Intake/Output Summary (Last 24 hours) at 12/25/12 0828 Last data filed at 12/24/12 1726  Gross per 24 hour  Intake    230 ml  Output    300 ml  Net    -70 ml    PE General: NAD Lungs- clear Cor RRR, soft syst murmur Abd: benign Ext: no edema Incis : healing well    Lab Results:  Recent Labs  12/22/12 1700  12/24/12 0535 12/25/12 0500  NA  --   < > 140 143  K  --   < > 4.2 3.8  CL  --   < > 108 106  CO2  --   < > 25 25  GLUCOSE  --   < > 117* 138*  BUN  --   < > 21 28*  CREATININE 1.33*  < > 1.47* 1.57*  CALCIUM  --   < > 8.7 9.1  MG 1.8  --   --   --   < > = values in this interval not displayed. No results found for this basename: AST, ALT, ALKPHOS, BILITOT, PROT, ALBUMIN,  in the last 72 hours No results found for this basename: LIPASE, AMYLASE,  in the last 72 hours  Recent Labs  12/24/12 0535 12/25/12 0500  WBC 6.6 7.6  HGB 9.8* 10.3*  HCT 30.3* 31.2*  MCV 96.8 95.4  PLT 75* 82*   No results found for this basename: CKTOTAL, CKMB, TROPONINI,  in the last 72 hours No components found with this basename: POCBNP,  No results found for this basename: DDIMER,  in the last 72 hours No results found for this basename: HGBA1C,  in the last 72 hours No results found for this basename: CHOL, HDL, LDLCALC,  TRIG, CHOLHDL,  in the last 72 hours No results found for this basename: TSH, T4TOTAL, FREET3, T3FREE, THYROIDAB,  in the last 72 hours No results found for this basename: VITAMINB12, FOLATE, FERRITIN, TIBC, IRON, RETICCTPCT,  in the last 72 hours  Medications: Scheduled . amiodarone  100 mg Oral Daily  . aspirin EC  81 mg Oral Daily  . bisacodyl  10 mg Oral Daily   Or  . bisacodyl  10 mg Rectal Daily  . carvedilol  12.5 mg Oral BID WC  . ceFEPime (MAXIPIME) IV  1 g Intravenous Q24H  . colchicine  0.6 mg Oral QODAY  . docusate sodium  200 mg Oral Daily  . donepezil  10 mg Oral QHS  . DULoxetine  60 mg Oral Daily  . enoxaparin (LOVENOX) injection  30 mg Subcutaneous QHS  . febuxostat  40 mg Oral Daily  . guaiFENesin  600 mg Oral BID  . hydrALAZINE  10 mg Oral Q8H  . insulin  aspart  0-24 Units Subcutaneous Q4H  . irbesartan  75 mg Oral Daily  . levothyroxine  100 mcg Oral QAC breakfast  . loratadine  10 mg Oral Daily  . memantine  10 mg Oral BID  . mupirocin ointment   Topical BID  . pantoprazole  40 mg Oral Daily  . pregabalin  75 mg Oral BID  . simvastatin  10 mg Oral q1800  . sodium chloride  3 mL Intravenous Q12H  . torsemide  20 mg Oral Daily  . Warfarin - Pharmacist Dosing Inpatient   Does not apply q1800     Radiology/Studies:  No results found.  INR:1.38 Will add last result for INR, ABG once components are confirmed Will add last 4 CBG results once components are confirmed  Assessment/Plan: S/P Procedure(s) (LRB): TRANSCATHETER AORTIC VALVE REPLACEMENT, TRANSFEMORAL (N/A) INTRAOPERATIVE TRANSESOPHAGEAL ECHOCARDIOGRAM (N/A) INSERTION CENTRAL LINE ADULT (Left)  1 Stable with no new issues 2 BP with adeq control, Creat sl increased,monitor now on torsemide and ARB 3 incis healing well 4 rhythm stable 5 On coumadin    LOS: 4 days    GOLD,WAYNE E 9/6/20148:28 AM   Chart reviewed, patient examined, agree with above.

## 2012-12-25 NOTE — Progress Notes (Signed)
ANTICOAGULATION/ANTIBIOTIC CONSULT NOTE - Follow Up Consult  Pharmacy Consult for Coumadin + Cefepime Indication: hx PE + aspiration pneumonia  Allergies  Allergen Reactions  . Codeine Hives  . Darvocet [Propoxyphene-Acetaminophen] Hives  . Nubain [Nalbuphine Hcl] Hives    Patient Measurements: Height: 4\' 10"  (147.3 cm) Weight: 163 lb 2.3 oz (74 kg) IBW/kg (Calculated) : 40.9  Vital Signs: Temp: 98.9 F (37.2 C) (09/06 0351) Temp src: Oral (09/06 0351) BP: 136/74 mmHg (09/06 0351) Pulse Rate: 79 (09/06 0351)  Labs:  Recent Labs  12/23/12 0320 12/24/12 0535 12/24/12 1512 12/25/12 0500  HGB 9.6* 9.8*  --  10.3*  HCT 28.9* 30.3*  --  31.2*  PLT 94* 75*  --  82*  LABPROT  --   --  14.9 14.3  INR  --   --  1.20 1.13  CREATININE 1.50* 1.47*  --  1.57*    Estimated Creatinine Clearance: 25.2 ml/min (by C-G formula based on Cr of 1.57).  Assessment: 80yof POD#4 TAVR  On coumadin pta for hx PE, resumed 9/5. INR subtherapeutic as expected after first dose. Hgb improved, platelets low but increased some. No bleeding reported.  Also on day#4 cefepime for ? aspiration pneumonia. CXR 9/4 showed improved aeration. No further fevers. WBC wnl. Renal function slightly worse but dosing still ok.  Goal of Therapy:  INR 2-3 Monitor platelets by anticoagulation protocol: Yes Appropriate cefepime dosing   Plan:  1) Repeat coumadin 3mg  x 1 2) INR, CBC in AM 3) Continue cefepime 1g q24 4) Watch renal function, ? LOT  Fredrik Rigger 12/25/2012,10:32 AM

## 2012-12-25 NOTE — Progress Notes (Signed)
Pt complaining of bilateral feet hurting, Left more than right. Pt states she fell at home a few days prior to admission and states she fell and hurt her feet and back. Pt has small, pink raised area on inner side of Left foot. MD made aware. Will continue to monitor.

## 2012-12-26 LAB — GLUCOSE, CAPILLARY
Glucose-Capillary: 108 mg/dL — ABNORMAL HIGH (ref 70–99)
Glucose-Capillary: 127 mg/dL — ABNORMAL HIGH (ref 70–99)
Glucose-Capillary: 153 mg/dL — ABNORMAL HIGH (ref 70–99)
Glucose-Capillary: 92 mg/dL (ref 70–99)

## 2012-12-26 LAB — BASIC METABOLIC PANEL
CO2: 26 mEq/L (ref 19–32)
Chloride: 104 mEq/L (ref 96–112)
Creatinine, Ser: 1.61 mg/dL — ABNORMAL HIGH (ref 0.50–1.10)
GFR calc Af Amer: 34 mL/min — ABNORMAL LOW (ref >90)
Potassium: 4 mEq/L (ref 3.5–5.1)

## 2012-12-26 LAB — PROTIME-INR: INR: 1.1 (ref 0.00–1.49)

## 2012-12-26 MED ORDER — FUROSEMIDE 10 MG/ML IJ SOLN
40.0000 mg | Freq: Two times a day (BID) | INTRAMUSCULAR | Status: DC
  Administered 2012-12-26 – 2012-12-27 (×3): 40 mg via INTRAVENOUS
  Filled 2012-12-26 (×5): qty 4

## 2012-12-26 MED ORDER — WARFARIN SODIUM 5 MG PO TABS
5.0000 mg | ORAL_TABLET | Freq: Once | ORAL | Status: AC
  Administered 2012-12-26: 5 mg via ORAL
  Filled 2012-12-26: qty 1

## 2012-12-26 MED ORDER — FUROSEMIDE 10 MG/ML IJ SOLN: 40.0000 mg | Freq: Two times a day (BID) | INTRAMUSCULAR | Status: DC

## 2012-12-26 NOTE — Progress Notes (Addendum)
[name]       [place] [address]             [phone]      5 Days Post-Op  Procedure(s) (LRB): TRANSCATHETER AORTIC VALVE REPLACEMENT, TRANSFEMORAL (N/A) INTRAOPERATIVE TRANSESOPHAGEAL ECHOCARDIOGRAM (N/A) INSERTION CENTRAL LINE ADULT (Left) Subjective: Feels ok, less leg/foot discomfort  Objective  Telemetry paced with some noncapture  Temp:  [97.8 F (36.6 C)-98.2 F (36.8 C)] 98.1 F (36.7 C) (09/07 0610) Pulse Rate:  [60-70] 66 (09/07 0610) Resp:  [16-18] 18 (09/07 0610) BP: (104-145)/(52-76) 145/76 mmHg (09/07 0610) SpO2:  [97 %-99 %] 97 % (09/07 0610) Weight:  [159 lb 13.3 oz (72.5 kg)] 159 lb 13.3 oz (72.5 kg) (09/07 0629)   Intake/Output Summary (Last 24 hours) at 12/26/12 0906 Last data filed at 12/25/12 1338  Gross per 24 hour  Intake    240 ml  Output      0 ml  Net    240 ml       General appearance: alert, cooperative and no distress Heart: regular rate and rhythm Lungs: clear to auscultation bilaterally Abdomen: soft, mild ttp lower quadrants Extremities: no edema Wound: incishealing well  Lab Results:  Recent Labs  12/25/12 0500 12/26/12 0505  NA 143 140  K 3.8 4.0  CL 106 104  CO2 25 26  GLUCOSE 138* 95  BUN 28* 33*  CREATININE 1.57* 1.61*  CALCIUM 9.1 8.7   No results found for this basename: AST, ALT, ALKPHOS, BILITOT, PROT, ALBUMIN,  in the last 72 hours No results found for this basename: LIPASE, AMYLASE,  in the last 72 hours  Recent Labs  12/24/12 0535 12/25/12 0500  WBC 6.6 7.6  HGB 9.8* 10.3*  HCT 30.3* 31.2*  MCV 96.8 95.4  PLT 75* 82*   No results found for this basename: CKTOTAL, CKMB, TROPONINI,  in the last 72 hours No components found with this basename: POCBNP,  No results found for this basename: DDIMER,  in the last 72 hours No results found for this basename: HGBA1C,  in the last 72 hours No results found for this basename: CHOL, HDL, LDLCALC, TRIG, CHOLHDL,  in the last 72  hours No results found for this basename: TSH, T4TOTAL, FREET3, T3FREE, THYROIDAB,  in the last 72 hours No results found for this basename: VITAMINB12, FOLATE, FERRITIN, TIBC, IRON, RETICCTPCT,  in the last 72 hours  Medications: Scheduled . amiodarone  100 mg Oral Daily  . aspirin EC  81 mg Oral Daily  . bisacodyl  10 mg Oral Daily   Or  . bisacodyl  10 mg Rectal Daily  . carvedilol  12.5 mg Oral BID WC  . colchicine  0.6 mg Oral QODAY  . docusate sodium  200 mg Oral Daily  . donepezil  10 mg Oral QHS  . DULoxetine  60 mg Oral Daily  . enoxaparin (LOVENOX) injection  30 mg Subcutaneous QHS  . febuxostat  40 mg Oral Daily  . guaiFENesin  600 mg Oral BID  . hydrALAZINE  10 mg Oral Q8H  . insulin aspart  0-24 Units Subcutaneous Q4H  . irbesartan  75 mg Oral Daily  . levothyroxine  100 mcg Oral QAC breakfast  . loratadine  10 mg Oral Daily  . memantine  10 mg Oral BID  . mupirocin ointment   Topical BID  . pantoprazole  40 mg Oral Daily  . pregabalin  75 mg Oral BID  .  simvastatin  10 mg Oral q1800  . sodium chloride  3 mL Intravenous Q12H  . Warfarin - Pharmacist Dosing Inpatient   Does not apply q1800     Radiology/Studies:  Dg Lumbar Spine 2-3 Views  12/25/2012   *RADIOLOGY REPORT*  Clinical Data: Low back pain after recent fall.  LUMBAR SPINE - 2-3 VIEW  Comparison: CT 11/10/2012  Findings: S-shaped thoracolumbar spine curvature, convex right in the lumbar segment.  IVC filter projecting at the L2-L3 level. Mild osteopenia.  Advanced spondylosis, including degenerative disc disease at L4-L5 and L5-S1.  Vertebral body height maintained.  Aortic atherosclerosis.  Dysmorphic appearance of the upper sacrum, felt to be similar to the prior CT.  IMPRESSION: Advance spondylosis and spinal curvature, without acute osseous finding.  Atherosclerosis.   Original Report Authenticated By: Jeronimo Greaves, M.D.   Dg Sacrum/coccyx  12/25/2012   *RADIOLOGY REPORT*  Clinical Data: Sacral and  coccygeal pain after recent fall.  SACRUM AND COCCYX - 2+ VIEW  Comparison: CT 11/10/2012  Findings: Advanced degenerative disease at the lumbosacral junction.  Mild degenerative irregularity of the bilateral sacroiliac joints.  Osteopenia.  Dysmorphic appearance of the S1 segment on the lateral view.  Grossly similar to the 11/10/2012 CT. Normal appearance of the coccyx on the lateral.  IMPRESSION: Advanced lower lumbar and lumbosacral degenerative disc disease. Atypical but grossly similar appearance of the upper sacrum on the lateral view.  If high clinical concern of injury, consider CT. Osteopenia.   Original Report Authenticated By: Jeronimo Greaves, M.D.   Dg Foot 2 Views Left  12/25/2012   *RADIOLOGY REPORT*  Clinical Data: Status post fall with bilateral foot pain.  LEFT FOOT - 2 VIEW  Comparison: None.  Findings: AP and lateral views.  Osteopenia.  Extensive degenerative irregularity with vascular calcifications.  Hammertoe deformities.  There is an apparent lucency through the distal phalanx of the great toe.  This is only apparent on the AP view, as there is extensive overlap of toes on the lateral.  IMPRESSION: Suspicion of a transverse fracture of the distal phalanx of the great toe.  Suboptimally evaluated secondary to lack of oblique image, extent of degenerative change and hammertoe deformities.  Consider dedicated three-view series to include oblique images.   Original Report Authenticated By: Jeronimo Greaves, M.D.   Dg Foot 2 Views Right  12/25/2012   *RADIOLOGY REPORT*  Clinical Data: Recent fall, bilateral foot pain  RIGHT FOOT - 2 VIEW  Comparison: Right foot, 06/18/2010  Findings: No fracture of the midfoot or forefoot.  No acute arthropathy.  There is degenerative change at the first metatarsal phalangeal joint.  No soft tissue abnormality other than vascular calcifications are  IMPRESSION: No acute osseous abnormality.   Original Report Authenticated By: Genevive Bi, M.D.    INR:1.10 Will  add last result for INR, ABG once components are confirmed Will add last 4 CBG results once components are confirmed  Assessment/Plan: S/P Procedure(s) (LRB): TRANSCATHETER AORTIC VALVE REPLACEMENT, TRANSFEMORAL (N/A) INTRAOPERATIVE TRANSESOPHAGEAL ECHOCARDIOGRAM (N/A) INSERTION CENTRAL LINE ADULT (Left)   Surgically stable, push rehab as able Cont medical management per primary     LOS: 5 days    [name],[name]   Chart reviewed, patient examined, agree with above.

## 2012-12-26 NOTE — Progress Notes (Signed)
Patient ID: [name], female   DOB: 1933-01-05, 79 y.o.   MRN: [id number]   Subjective:  The patient denies any chest pain.  Not short of breath in bed.  Foot/back pain better.  Possible fracture of distal phalanx of great toe on plain film.  She states that she fell at home several days prior to coming to the hospital.  Because of her feet pain physical therapy has not been able to mobilize her as it is painful for her to stand up.  Objective:  Vital Signs in the last 24 hours: Temp:  [97.8 F (36.6 C)-98.2 F (36.8 C)] 98.1 F (36.7 C) (09/07 0610) Pulse Rate:  [60-70] 66 (09/07 0610) Resp:  [16-18] 18 (09/07 0610) BP: (104-145)/(52-76) 145/76 mmHg (09/07 0610) SpO2:  [97 %-99 %] 97 % (09/07 0610) Weight:  [72.5 kg (159 lb 13.3 oz)] 72.5 kg (159 lb 13.3 oz) (09/07 0629)  Intake/Output from previous day: 09/06 0701 - 09/07 0700 In: 360 [P.O.:360] Out: -  Intake/Output from this shift:    . amiodarone  100 mg Oral Daily  . aspirin EC  81 mg Oral Daily  . bisacodyl  10 mg Oral Daily   Or  . bisacodyl  10 mg Rectal Daily  . carvedilol  12.5 mg Oral BID WC  . colchicine  0.6 mg Oral QODAY  . docusate sodium  200 mg Oral Daily  . donepezil  10 mg Oral QHS  . DULoxetine  60 mg Oral Daily  . enoxaparin (LOVENOX) injection  30 mg Subcutaneous QHS  . febuxostat  40 mg Oral Daily  . furosemide  40 mg Intravenous BID  . guaiFENesin  600 mg Oral BID  . hydrALAZINE  10 mg Oral Q8H  . insulin aspart  0-24 Units Subcutaneous Q4H  . irbesartan  75 mg Oral Daily  . levothyroxine  100 mcg Oral QAC breakfast  . loratadine  10 mg Oral Daily  . memantine  10 mg Oral BID  . mupirocin ointment   Topical BID  . pantoprazole  40 mg Oral Daily  . pregabalin  75 mg Oral BID  . simvastatin  10 mg Oral q1800  . sodium chloride  3 mL Intravenous Q12H  . warfarin  5 mg Oral ONCE-1800  . Warfarin - Pharmacist Dosing Inpatient   Does not apply q1800   . sodium chloride    . lactated  ringers 20 mL/hr at 12/21/12 1600    Physical Exam: General: NAD Neck: JVP 10-11 cm, no thyromegaly or thyroid nodule.  Lungs: Crackles at bases bilaterally CV: Nondisplaced PMI.  Heart regular S1/S2, no S3/S4, no murmur.  No peripheral edema.   Abdomen: Soft, nontender, no hepatosplenomegaly, no distention.  Skin: Intact without lesions or rashes.  Neurologic: Alert and oriented x 3.  Psych: Normal affect. Extremities: No clubbing or cyanosis.   Lab Results:  Recent Labs  12/24/12 0535 12/25/12 0500  WBC 6.6 7.6  HGB 9.8* 10.3*  PLT 75* 82*    Recent Labs  12/25/12 0500 12/26/12 0505  NA 143 140  K 3.8 4.0  CL 106 104  CO2 25 26  GLUCOSE 138* 95  BUN 28* 33*  CREATININE 1.57* 1.61*   No results found for this basename: TROPONINI, CK, MB,  in the last 72 hours Hepatic Function Panel No results found for this basename: PROT, ALBUMIN, AST, ALT, ALKPHOS, BILITOT, BILIDIR, IBILI,  in the last 72 hours No results found for this basename: CHOL,  in the last 72 hours No results found for this basename: PROTIME,  in the last 72 hours  Imaging: Dg Lumbar Spine 2-3 Views  12/25/2012   *RADIOLOGY REPORT*  Clinical Data: Low back pain after recent fall.  LUMBAR SPINE - 2-3 VIEW  Comparison: CT 11/10/2012  Findings: S-shaped thoracolumbar spine curvature, convex right in the lumbar segment.  IVC filter projecting at the L2-L3 level. Mild osteopenia.  Advanced spondylosis, including degenerative disc disease at L4-L5 and L5-S1.  Vertebral body height maintained.  Aortic atherosclerosis.  Dysmorphic appearance of the upper sacrum, felt to be similar to the prior CT.  IMPRESSION: Advance spondylosis and spinal curvature, without acute osseous finding.  Atherosclerosis.   Original Report Authenticated By: Jeronimo Greaves, M.D.   Dg Sacrum/coccyx  12/25/2012   *RADIOLOGY REPORT*  Clinical Data: Sacral and coccygeal pain after recent fall.  SACRUM AND COCCYX - 2+ VIEW  Comparison: CT  11/10/2012  Findings: Advanced degenerative disease at the lumbosacral junction.  Mild degenerative irregularity of the bilateral sacroiliac joints.  Osteopenia.  Dysmorphic appearance of the S1 segment on the lateral view.  Grossly similar to the 11/10/2012 CT. Normal appearance of the coccyx on the lateral.  IMPRESSION: Advanced lower lumbar and lumbosacral degenerative disc disease. Atypical but grossly similar appearance of the upper sacrum on the lateral view.  If high clinical concern of injury, consider CT. Osteopenia.   Original Report Authenticated By: Jeronimo Greaves, M.D.   Dg Foot 2 Views Left  12/25/2012   *RADIOLOGY REPORT*  Clinical Data: Status post fall with bilateral foot pain.  LEFT FOOT - 2 VIEW  Comparison: None.  Findings: AP and lateral views.  Osteopenia.  Extensive degenerative irregularity with vascular calcifications.  Hammertoe deformities.  There is an apparent lucency through the distal phalanx of the great toe.  This is only apparent on the AP view, as there is extensive overlap of toes on the lateral.  IMPRESSION: Suspicion of a transverse fracture of the distal phalanx of the great toe.  Suboptimally evaluated secondary to lack of oblique image, extent of degenerative change and hammertoe deformities.  Consider dedicated three-view series to include oblique images.   Original Report Authenticated By: Jeronimo Greaves, M.D.   Dg Foot 2 Views Right  12/25/2012   *RADIOLOGY REPORT*  Clinical Data: Recent fall, bilateral foot pain  RIGHT FOOT - 2 VIEW  Comparison: Right foot, 06/18/2010  Findings: No fracture of the midfoot or forefoot.  No acute arthropathy.  There is degenerative change at the first metatarsal phalangeal joint.  No soft tissue abnormality other than vascular calcifications are  IMPRESSION: No acute osseous abnormality.   Original Report Authenticated By: Genevive Bi, M.D.    Cardiac Studies: Telemetry shows atrial pacing.  Assessment/Plan:  1. Severe aortic valve  stenosis: s/p TAVR procedure 12/21/12. Echo stable post-op. Valve functioning well. No AI.  2. HTN: BP stable  3. Post-op fever/non-productive cough post-extubation: Afebrile, normal WBCs.  Off antibiotics now.  4. Atrial fibrillation: Maintaining NSR on amio.  Coumadin has been resumed. 5. Left groin surgical wound dehiscence: Stable, CVTS following. 6. Anemia/thrombocytopenia: Stably low hemoglobin and platelets, no CBC today.  Will order for the am. 7. Acute/chronic systolic CHF: Looks volume overloaded on exam with elevated JVP, still on oxygen. Will give 2 doses of IV lasix today, follow creatinine closely.   8. CKD stage III: Creatinine basically stable but will need to follow closely.  As above, given IV lasix today.  9. bilateral foot pain  and low back pain: Possible toe fracture on plain film.  Back films with arthritis.    LOS: 5 days    Marca Ancona 12/26/2012, 11:09 AM

## 2012-12-26 NOTE — Progress Notes (Signed)
Blood sugar was 67 at this time.  Pt has no symptoms and is feeling fine.  Pt given 120 cc orange juice to drink. Pt drank all of the orange juice and ate cup of vanilla ice cream.  Will continue to assess and recheck blood sugar at 2100.

## 2012-12-26 NOTE — Progress Notes (Signed)
ANTICOAGULATION CONSULT NOTE - Follow Up Consult  Pharmacy Consult for Coumadin Indication: hx PE  Allergies  Allergen Reactions  . Codeine Hives  . Darvocet [Propoxyphene-Acetaminophen] Hives  . Nubain [Nalbuphine Hcl] Hives    Patient Measurements: Height: 4\' 10"  (147.3 cm) Weight: 159 lb 13.3 oz (72.5 kg) IBW/kg (Calculated) : 40.9  Vital Signs: Temp: 98.1 F (36.7 C) (09/07 0610) Temp src: Oral (09/07 0610) BP: 145/76 mmHg (09/07 0610) Pulse Rate: 66 (09/07 0610)  Labs:  Recent Labs  12/24/12 0535 12/24/12 1512 12/25/12 0500 12/26/12 0505  HGB 9.8*  --  10.3*  --   HCT 30.3*  --  31.2*  --   PLT 75*  --  82*  --   LABPROT  --  14.9 14.3 14.0  INR  --  1.20 1.13 1.10  CREATININE 1.47*  --  1.57* 1.61*    Estimated Creatinine Clearance: 24.3 ml/min (by C-G formula based on Cr of 1.61).  Assessment: 80yof POD#5 TAVR resumed on her home coumadin 9/5 for hx PE. INR remains subtherapeutic and actually decreased after 2 doses. Will increase tonight's dose. No bleeding reported.  Goal of Therapy:  INR 2-3 Monitor platelets by anticoagulation protocol: Yes   Plan:  1) Increase coumadin to 5mg  x 1 2) INR in AM  Fredrik Rigger 12/26/2012,11:00 AM

## 2012-12-27 LAB — BASIC METABOLIC PANEL
BUN: 36 mg/dL — ABNORMAL HIGH (ref 6–23)
CO2: 31 mEq/L (ref 19–32)
Calcium: 8.9 mg/dL (ref 8.4–10.5)
Chloride: 101 mEq/L (ref 96–112)
Creatinine, Ser: 1.48 mg/dL — ABNORMAL HIGH (ref 0.50–1.10)
Glucose, Bld: 96 mg/dL (ref 70–99)

## 2012-12-27 LAB — CBC
HCT: 30.3 % — ABNORMAL LOW (ref 36.0–46.0)
MCH: 31.5 pg (ref 26.0–34.0)
MCHC: 34 g/dL (ref 30.0–36.0)
MCV: 92.7 fL (ref 78.0–100.0)
Platelets: 126 10*3/uL — ABNORMAL LOW (ref 150–400)
RDW: 14.5 % (ref 11.5–15.5)

## 2012-12-27 LAB — GLUCOSE, CAPILLARY
Glucose-Capillary: 101 mg/dL — ABNORMAL HIGH (ref 70–99)
Glucose-Capillary: 103 mg/dL — ABNORMAL HIGH (ref 70–99)

## 2012-12-27 LAB — PROTIME-INR: INR: 1.2 (ref 0.00–1.49)

## 2012-12-27 MED ORDER — WARFARIN SODIUM 5 MG PO TABS
5.0000 mg | ORAL_TABLET | Freq: Once | ORAL | Status: AC
  Administered 2012-12-27: 5 mg via ORAL
  Filled 2012-12-27: qty 1

## 2012-12-27 MED ORDER — TORSEMIDE 20 MG PO TABS
20.0000 mg | ORAL_TABLET | Freq: Every day | ORAL | Status: DC
  Administered 2012-12-28: 20 mg via ORAL
  Filled 2012-12-27: qty 1

## 2012-12-27 NOTE — Progress Notes (Signed)
Speech Language Pathology Dysphagia Treatment Patient Details Name: [name] MRN: [id number] DOB: 1932/12/26 Today's Date: 12/27/2012 Time: 4098-1191 SLP Time Calculation (min): 17 min  Assessment / Plan / Recommendation Clinical Impression  Treatment focused on differential diagnosis of swallowing abilitites for potential diet advancement. Dentures note in place although patient reports mildly ill fitting. SLP provided po trials of mechanical soft textures in which she was able to consume without observed difficulty and with independent use of swallowing precautios including alternation of bite and sip to aid in esophageal clearance. C/o globus and occassional difficulty noted on FEES exam likely a result of known h/o esophageal dysmotility combined with general deconditioning. Recommend diet advancement to mechanical soft (due to loose dentures and esophageal deficits), thin liquids with continued use of precautions. No further SLP f/u indicated at this time. Signing off. Please reconsult if needed.     Diet Recommendation  Initiate / Change Diet: Dysphagia 3 (mechanical soft);Thin liquid    SLP Plan All goals met   Pertinent Vitals/Pain none   Swallowing Goals  SLP Swallowing Goals Patient will utilize recommended strategies during swallow to increase swallowing safety with: Supervision/safety Swallow Study Goal #2 - Progress: Met  General Temperature Spikes Noted: No Respiratory Status: Supplemental O2 delivered via (comment) (nasal cannula) Behavior/Cognition: Alert;Cooperative;Pleasant mood Oral Cavity - Dentition: Dentures, top;Dentures, bottom Patient Positioning: Upright in bed      Dysphagia Treatment Treatment focused on: Skilled observation of diet tolerance;Upgraded PO texture trials;Patient/family/caregiver education;Utilization of compensatory strategies Treatment Methods/Modalities: Skilled observation;Differential diagnosis Patient observed directly with PO's:  Yes Type of PO's observed: Dysphagia 1 (puree);Thin liquids;Dysphagia 3 (soft) Feeding: Able to feed self Liquids provided via: Cup   [employer] MA, CCC-SLP [phone]   [name] 12/27/2012, 9:37 AM

## 2012-12-27 NOTE — Progress Notes (Signed)
      [address]       [place] [address]             [phone]     CARDIOTHORACIC SURGERY PROGRESS NOTE  6 Days Post-Op  S/P Procedure(s) (LRB): TRANSCATHETER AORTIC VALVE REPLACEMENT, TRANSFEMORAL (N/A) INTRAOPERATIVE TRANSESOPHAGEAL ECHOCARDIOGRAM (N/A) INSERTION CENTRAL LINE ADULT (Left)  Subjective: Feels a little better today  Objective: Vital signs in last 24 hours: Temp:  [98.4 F (36.9 C)-98.6 F (37 C)] 98.6 F (37 C) (09/08 0459) Pulse Rate:  [62-73] 65 (09/08 0825) Cardiac Rhythm:  [-] A-V Sequential paced (09/07 2000) Resp:  [16-18] 18 (09/08 0459) BP: (119-157)/(60-86) 148/60 mmHg (09/08 0825) SpO2:  [94 %-99 %] 94 % (09/08 0519) Weight:  [71.7 kg (158 lb 1.1 oz)] 71.7 kg (158 lb 1.1 oz) (09/08 0519)  Physical Exam:  Rhythm:   paced  Breath sounds: Diminished at bases  Heart sounds:  RRR  Incisions:  Clean and dry, slight erythema, overall stable w/ no sign of infection  Abdomen:  soft  Extremities:  warm   Intake/Output from previous day: 09/07 0701 - 09/08 0700 In: 820 [P.O.:820] Out: -  Intake/Output this shift:    Lab Results:  Recent Labs  12/25/12 0500 12/27/12 0442  WBC 7.6 6.4  HGB 10.3* 10.3*  HCT 31.2* 30.3*  PLT 82* 126*   BMET:  Recent Labs  12/26/12 0505 12/27/12 0442  NA 140 140  K 4.0 4.1  CL 104 101  CO2 26 31  GLUCOSE 95 96  BUN 33* 36*  CREATININE 1.61* 1.48*  CALCIUM 8.7 8.9    CBG (last 3)   Recent Labs  12/26/12 2344 12/27/12 0413 12/27/12 0834  GLUCAP 127* 95 104*   PT/INR:   Recent Labs  12/27/12 0442  LABPROT 14.9  INR 1.20    CXR:  N/A  Assessment/Plan: S/P Procedure(s) (LRB): TRANSCATHETER AORTIC VALVE REPLACEMENT, TRANSFEMORAL (N/A) INTRAOPERATIVE TRANSESOPHAGEAL ECHOCARDIOGRAM (N/A) INSERTION CENTRAL LINE ADULT (Left)  Overall slowly improving Groin incision okay Plan per Dr Excell Seltzer and Cardiology team  [facility] 12/27/2012 9:18 AM

## 2012-12-27 NOTE — Progress Notes (Signed)
CSW presented bed offers to patient. Patient's preference is [facility]. CSW contacted [facility] and confirmed a bed when ready for dc. FL2 on chart to be signed by MD.  Maree Krabbe, MSW, Theresia Majors [phone]

## 2012-12-27 NOTE — Anesthesia Postprocedure Evaluation (Signed)
  Anesthesia Post-op Note  Patient: [name]  Procedure(s) Performed: Procedure(s): TRANSCATHETER AORTIC VALVE REPLACEMENT, TRANSFEMORAL (N/A) INTRAOPERATIVE TRANSESOPHAGEAL ECHOCARDIOGRAM (N/A) INSERTION CENTRAL LINE ADULT (Left)  Patient Location: SICU  Anesthesia Type:General  Level of Consciousness: awake, alert  and oriented  Airway and Oxygen Therapy: Patient connected to nasal cannula oxygen  Post-op Pain: none  Post-op Assessment: Patient's Cardiovascular Status Stable  Post-op Vital Signs: stable  Complications: No apparent anesthesia complications

## 2012-12-27 NOTE — Progress Notes (Signed)
Physical Therapy Treatment Patient Details Name: [name] MRN: [id number] DOB: May 27, 1932 Today's Date: 12/27/2012 Time: 9629-5284 PT Time Calculation (min): 25 min  PT Assessment / Plan / Recommendation  History of Present Illness 79 y.o. female s/p TAVR due to severe aortic stenosis.  Pt has extensive arthritis and is 02 dependent at baseline. Hx of peripheral neuropathy. She experienced a fall just prior to admission injuring her coccyx.   PT Comments   Patient demonstrates improvements in cognition today, alert and oriented and appropriately conversational and pleasant. Patient still presenting with significant pain in bilateral feet, unable to tolerate ambulation, extreme pain in weight bearing.  Patient did tolerated assisted transfer (+2) to chair. Will continue to work with patient and progress activity as tolerated.   Follow Up Recommendations  SNF     Does the patient have the potential to tolerate intense rehabilitation     Barriers to Discharge        Equipment Recommendations  None recommended by PT    Recommendations for Other Services    Frequency Min 3X/week   Progress towards PT Goals Progress towards PT goals: Progressing toward goals  Plan Current plan remains appropriate    Precautions / Restrictions     Pertinent Vitals/Pain 7/10 foot pain, SpO2 >96% on rm air during activity    Mobility  Bed Mobility Bed Mobility: Rolling Right;Rolling Left;Supine to Sit;Sitting - Scoot to [place] of Bed Rolling Right: 5: Supervision Rolling Left: 5: Supervision Supine to Sit: 4: Min assist Sitting - Scoot to Edge of Bed: 4: Min assist Details for Bed Mobility Assistance: Much improved bed mobility today in coparison to prior sessions Transfers Transfers: Sit to Stand;Stand to [employer] Transfers Sit to Stand: 1: +2 Total assist Sit to Stand: Patient Percentage: 50% Stand to Sit: 1: +2 Total assist Stand to Sit: Patient Percentage: 50% Stand Pivot  Transfers: 1: +2 Total assist Stand Pivot Transfers: Patient Percentage: 30% Details for Transfer Assistance: Much improved participation and effort today, still significantly limited by bilateral foot pain, used check pad and +2 for pivot to chair, patient apologetic for being in so much pain Ambulation/Gait Ambulation/Gait Assistance: Not tested (comment) General Gait Details: still unable to toelrated weight bearing through bilateral LE (feet)_      PT Goals (current goals can now be found in the care plan section) Acute Rehab PT Goals Patient Stated Goal: SNF for rehab prior to return home with son. PT Goal Formulation: With patient Time For Goal Achievement: 01/05/13 Potential to Achieve Goals: Fair  Visit Information  Last PT Received On: 12/27/12 Assistance Needed: +2 History of Present Illness: 79 y.o. female s/p TAVR due to severe aortic stenosis.  Pt has extensive arthritis and is 02 dependent at baseline. Hx of peripheral neuropathy. She experienced a fall just prior to admission injuring her coccyx.    Subjective Data  Patient Stated Goal: SNF for rehab prior to return home with son.   Cognition  Cognition Arousal/Alertness: Awake/alert Behavior During Therapy: WFL for tasks assessed/performed Overall Cognitive Status: History of cognitive impairments - at baseline (per chart review, pt has baseline dementia)    Balance  Static Sitting Balance Static Sitting - Balance Support: Feet supported Static Sitting - Level of Assistance: 5: Stand by assistance Static Sitting - Comment/# of Minutes: 3 minutes EOB  End of Session PT - End of Session Equipment Utilized During Treatment: Gait belt;Oxygen Activity Tolerance: Patient limited by pain Patient left: in chair;with call bell/phone within reach  Nurse Communication: Mobility status (incontinent of stool, unable to tolerated amb)   GP     Fabio Asa 12/27/2012, 11:03 AM Charlotte Crumb, PT DPT  [phone]

## 2012-12-27 NOTE — Progress Notes (Signed)
Inpatient Diabetes Program Recommendations  AACE/ADA: New Consensus Statement on Inpatient Glycemic Control (2013)  Target Ranges:  Prepandial:   less than 140 mg/dL      Peak postprandial:   less than 180 mg/dL (1-2 hours)      Critically ill patients:  140 - 180 mg/dL     Results for SAKIYAH, SHUR (MRN [id number]) as of 12/27/2012 10:59  Ref. Range 12/25/2012 23:58 12/26/2012 04:32 12/26/2012 07:59 12/26/2012 11:12 12/26/2012 16:01 12/26/2012 20:01 12/26/2012 21:01  Glucose-Capillary Latest Range: 70-99 mg/dL 409 (H) 92 811 (H) 914 (H) 143 (H) 67 (L) 123 (H)    Results for ORIA, KLIMAS (MRN [id number]) as of 12/27/2012 10:59  Ref. Range 12/26/2012 23:44 12/27/2012 04:13 12/27/2012 08:34  Glucose-Capillary Latest Range: 70-99 mg/dL 086 (H) 95 578 (H)    **Hypoglycemic after receiving 2 units of Novolog last night  **MD- Please consider d/c of Novolog SSI  (Patient without history of DM- A1c 5.5%)   Will follow. Ambrose Finland RN, MSN, CDE Diabetes Coordinator Inpatient Diabetes Program [phone]

## 2012-12-27 NOTE — Progress Notes (Signed)
ANTICOAGULATION CONSULT NOTE - Follow Up Consult  Pharmacy Consult for warfarin Indication: hx PE  Allergies  Allergen Reactions  . Codeine Hives  . Darvocet [Propoxyphene-Acetaminophen] Hives  . Nubain [Nalbuphine Hcl] Hives    Patient Measurements: Height: 4\' 10"  (147.3 cm) Weight: 158 lb 1.1 oz (71.7 kg) IBW/kg (Calculated) : 40.9  Vital Signs: Temp: 98.6 F (37 C) (09/08 0459) Temp src: Oral (09/08 0459) BP: 148/60 mmHg (09/08 0825) Pulse Rate: 65 (09/08 0825)  Labs:  Recent Labs  12/25/12 0500 12/26/12 0505 12/27/12 0442  HGB 10.3*  --  10.3*  HCT 31.2*  --  30.3*  PLT 82*  --  126*  LABPROT 14.3 14.0 14.9  INR 1.13 1.10 1.20  CREATININE 1.57* 1.61* 1.48*    Estimated Creatinine Clearance: 26.3 ml/min (by C-G formula based on Cr of 1.48).  Assessment: 80yof POD#6 TAVR resumed on her home coumadin 9/5 for hx PE. INR actually decreased after 2 doses of 3mg  of warfarin, and the dose was increased to 5mg  last evening. Her INR this morning did rise to 1.2, but she is still subtherapeutic. Platelets rose to 126 from 82, Hgb stable at 10.3. No overt bleeding noted.  Goal of Therapy:  INR 2-3 Monitor platelets by anticoagulation protocol: Yes   Plan:  1. Warfarin PO 5mg  x 1 tonight 2. Daily PT/INR 3. Continue to follow CBC  Riyan Haile D. Lynix Bonine, PharmD Clinical Pharmacist Pager: [phone] 12/27/2012 8:45 AM

## 2012-12-27 NOTE — Progress Notes (Signed)
    Subjective:  No chest pain or dyspnea today. Biggest problems remain pain in her feet and difficulty standing or walking.  Objective:  Vital Signs in the last 24 hours: Temp:  [98.4 F (36.9 C)-98.6 F (37 C)] 98.6 F (37 C) (09/08 0459) Pulse Rate:  [62-73] 65 (09/08 0825) Resp:  [16-18] 18 (09/08 0459) BP: (119-157)/(60-86) 148/60 mmHg (09/08 0825) SpO2:  [94 %-99 %] 94 % (09/08 0519) Weight:  [71.7 kg (158 lb 1.1 oz)] 71.7 kg (158 lb 1.1 oz) (09/08 0519)  Intake/Output from previous day: 09/07 0701 - 09/08 0700 In: 820 [P.O.:820] Out: -   Physical Exam: Pt is alert and oriented, pleasant elderly woman in NAD HEENT: normal Neck: JVP - normal Lungs: CTA bilaterally CV: RRR without murmur or gallop Abd: soft, NT, Positive BS, no hepatomegaly Ext: Trace edema bilaterally, distal pulses intact and equal Skin: warm/dry no rash   Lab Results:  Recent Labs  12/25/12 0500 12/27/12 0442  WBC 7.6 6.4  HGB 10.3* 10.3*  PLT 82* 126*    Recent Labs  12/26/12 0505 12/27/12 0442  NA 140 140  K 4.0 4.1  CL 104 101  CO2 26 31  GLUCOSE 95 96  BUN 33* 36*  CREATININE 1.61* 1.48*   No results found for this basename: TROPONINI, CK, MB,  in the last 72 hours  Tele: Atrial pacing, rare PVCs  Assessment/Plan:  1. Severe aortic stenosis status post TAVR. Normal valve function by postoperative echo.  2. Acute on chronic systolic and diastolic heart failure. The patient is clinically improved with IV furosemide. Recommend switch back to oral furosemide today. She will continue on a combination of carvedilol, hydralazine, and Avapro.  3. CKD. stage III. Creatinine stable with diuresis.  4. Paroxysmal atrial fibrillation. The patient is on amiodarone 100 mg daily. Warfarin has been restarted and her INR is 1.3. Continue the same.  Disposition: I think the patient is medically stable. She needs a lot of assistance even with standing up. She clearly will require skilled  nursing for rehabilitation. She requests [facility]. Will ask the case manager to facilitate placement.  Tonny Bollman, M.D. 12/27/2012, 10:50 AM

## 2012-12-28 ENCOUNTER — Encounter (HOSPITAL_COMMUNITY): Payer: Self-pay | Admitting: Physician Assistant

## 2012-12-28 ENCOUNTER — Telehealth: Payer: Self-pay | Admitting: Cardiovascular Disease

## 2012-12-28 DIAGNOSIS — T8131XA Disruption of external operation (surgical) wound, not elsewhere classified, initial encounter: Secondary | ICD-10-CM

## 2012-12-28 DIAGNOSIS — D696 Thrombocytopenia, unspecified: Secondary | ICD-10-CM

## 2012-12-28 DIAGNOSIS — D62 Acute posthemorrhagic anemia: Secondary | ICD-10-CM

## 2012-12-28 DIAGNOSIS — J9811 Atelectasis: Secondary | ICD-10-CM

## 2012-12-28 DIAGNOSIS — S92919A Unspecified fracture of unspecified toe(s), initial encounter for closed fracture: Secondary | ICD-10-CM

## 2012-12-28 DIAGNOSIS — M545 Low back pain, unspecified: Secondary | ICD-10-CM

## 2012-12-28 LAB — BASIC METABOLIC PANEL
Chloride: 100 mEq/L (ref 96–112)
Creatinine, Ser: 1.32 mg/dL — ABNORMAL HIGH (ref 0.50–1.10)
GFR calc Af Amer: 44 mL/min — ABNORMAL LOW (ref >90)
Potassium: 4.2 mEq/L (ref 3.5–5.1)
Sodium: 139 mEq/L (ref 135–145)

## 2012-12-28 LAB — PROTIME-INR
INR: 1.53 — ABNORMAL HIGH (ref 0.00–1.49)
Prothrombin Time: 18 seconds — ABNORMAL HIGH (ref 11.6–15.2)

## 2012-12-28 LAB — GLUCOSE, CAPILLARY: Glucose-Capillary: 97 mg/dL (ref 70–99)

## 2012-12-28 MED ORDER — ASPIRIN 81 MG PO TBEC: 81.0000 mg | DELAYED_RELEASE_TABLET | Freq: Every day | ORAL | Status: DC

## 2012-12-28 MED ORDER — GUAIFENESIN ER 600 MG PO TB12: 600.0000 mg | ORAL_TABLET | Freq: Two times a day (BID) | ORAL | Status: DC

## 2012-12-28 MED ORDER — WARFARIN SODIUM 4 MG PO TABS: 4.0000 mg | ORAL_TABLET | Freq: Every day | ORAL | Status: DC

## 2012-12-28 MED ORDER — WARFARIN SODIUM 4 MG PO TABS
4.0000 mg | ORAL_TABLET | Freq: Once | ORAL | Status: DC
  Filled 2012-12-28: qty 1

## 2012-12-28 MED ORDER — CARVEDILOL 25 MG PO TABS: 12.5000 mg | ORAL_TABLET | Freq: Two times a day (BID) | ORAL | Status: DC

## 2012-12-28 MED ORDER — OXYCODONE HCL 5 MG PO TABS: 5.0000 mg | ORAL_TABLET | ORAL | Status: DC | PRN

## 2012-12-28 NOTE — Progress Notes (Signed)
Report called to [facility]. Pt for dc today, via non emergency EMS. [name] A

## 2012-12-28 NOTE — Progress Notes (Signed)
    Subjective:  Feels pretty good. No chest pain or dyspnea.   Objective:  Vital Signs in the last 24 hours: Temp:  [97.5 F (36.4 C)-98.5 F (36.9 C)] 97.5 F (36.4 C) (09/09 0500) Pulse Rate:  [60-68] 68 (09/09 0500) Resp:  [18] 18 (09/09 0500) BP: (120-151)/(47-77) 137/51 mmHg (09/09 0500) SpO2:  [94 %-96 %] 95 % (09/09 0500) Weight:  [71.7 kg (158 lb 1.1 oz)] 71.7 kg (158 lb 1.1 oz) (09/09 0500)  Intake/Output from previous day: 09/08 0701 - 09/09 0700 In: 480 [P.O.:480] Out: -   Physical Exam: Pt is alert and oriented, NAD HEENT: normal Neck: JVP - normal Lungs: CTA bilaterally CV: RRR without murmur or gallop Abd: soft, NT, Positive BS, no hepatomegaly Ext: no C/C/E, groin incision site is clear Skin: warm/dry no rash   Lab Results:  Recent Labs  12/27/12 0442  WBC 6.4  HGB 10.3*  PLT 126*    Recent Labs  12/27/12 0442 12/28/12 0647  NA 140 139  K 4.1 4.2  CL 101 100  CO2 31 29  GLUCOSE 96 91  BUN 36* 33*  CREATININE 1.48* 1.32*   No results found for this basename: TROPONINI, CK, MB,  in the last 72 hours  Tele: Atrial pacing  Assessment/Plan:  1. Severe AS s/p TAVR, normal valve prosthesis function 2. Acute on chronic systolic and diastolic heart failure. Med program reviewed and appropriate. She appears well-compensated. Continue current Rx. 3. Atrial fib, paroxysmal. Continue amio, anticoag with warfarin. INR trending up. INR will need to be followed at [facility] 4. Dispo: discharge to SNF today. Pt has a bed at [facility]. Follow-up with PA/NP 1-2 weeks and FOV with me has been arranged in one month with an echo at that time.   Tonny Bollman, M.D. 12/28/2012, 7:50 AM

## 2012-12-28 NOTE — Progress Notes (Addendum)
ANTICOAGULATION CONSULT NOTE - Follow Up Consult  Pharmacy Consult for warfarin Indication: hx PE  Allergies  Allergen Reactions  . Codeine Hives  . Darvocet [Propoxyphene-Acetaminophen] Hives  . Nubain [Nalbuphine Hcl] Hives    Patient Measurements: Height: 4\' 10"  (147.3 cm) Weight: 158 lb 1.1 oz (71.7 kg) IBW/kg (Calculated) : 40.9  Vital Signs: Temp: 97.5 F (36.4 C) (09/09 0500) Temp src: Oral (09/09 0500) BP: 160/75 mmHg (09/09 0750) Pulse Rate: 70 (09/09 0750)  Labs:  Recent Labs  12/26/12 0505 12/27/12 0442 12/28/12 0647  HGB  --  10.3*  --   HCT  --  30.3*  --   PLT  --  126*  --   LABPROT 14.0 14.9 18.0*  INR 1.10 1.20 1.53*  CREATININE 1.61* 1.48* 1.32*    Estimated Creatinine Clearance: 29.5 ml/min (by C-G formula based on Cr of 1.32).  Assessment: 80yof POD#7 TAVR resumed on warfarin 9/5 for hx PE. INR today is 1.53 after 2 doses of 3mg  and 2 doses of 5mg . Patient to go to SNF today Platelets rose to 126 from 82, Hgb stable at 10.3. No overt bleeding noted.  Goal of Therapy:  INR 2-3 Monitor platelets by anticoagulation protocol: Yes   Plan:  1. Recommend warfarin 4mg  daily with her next INR check on Friday 2. Her CBC should be checked Friday as well to ensure her platelets have stabilized  Jancie Kercher D. Gurtaj Ruz, PharmD Clinical Pharmacist Pager: [phone] 12/28/2012 8:45 AM

## 2012-12-28 NOTE — Telephone Encounter (Signed)
toc pt /appt with lori 01-07-13

## 2012-12-28 NOTE — Progress Notes (Signed)
TCTS BRIEF PROGRESS NOTE   Agree w/ plans for d/c to SNF today  Giannamarie Paulus H 12/28/2012 8:07 AM

## 2012-12-28 NOTE — Progress Notes (Signed)
Pt dc to SNF [facility] with belongings, DC packets includes instructions Georgette Dover

## 2012-12-28 NOTE — Discharge Summary (Signed)
Discharge Summary   Patient ID: [name],  MRN: [id number], DOB/AGE: 05-14-1932 79 y.o.  Admit date: 12/21/2012 Discharge date: 12/28/2012  Primary Physician: Pearson Grippe, MD Primary Cardiologist: Judie Petit. Excell Seltzer, MD/G. Ladona Ridgel, MD   Discharge Diagnoses Principal Problem:   S/P TAVR (transcatheter aortic valve replacement) Active Problems:   Severe aortic stenosis   Paroxysmal atrial fibrillation   Acute on chronic systolic and diastolic heart failure, NYHA class 3   Dyslipidemia   Hypertension   Acute blood loss anemia   Thrombocytopenia   Toe fracture   CKD (chronic kidney disease), stage III   Low back pain   Wound dehiscence, surgical   Atelectasis   Allergies Allergies  Allergen Reactions  . Codeine Hives  . Darvocet [Propoxyphene-Acetaminophen] Hives  . Nubain [Nalbuphine Hcl] Hives    Diagnostic Studies/Procedures  TRANSCATHETER AORTIC VALVE REPLACEMENT - 12/21/12  See op note for full details. Briefly, femoral arterial cutdown was performed by Dr. Cornelius Moras with TCTS to obtain transfemoral access. An Edwards Sapien XT THV (size 29 mm) was successfully placed by this approach.   TRANSESOPHAGEAL ECHOCARDIOGRAM - 12/21/12  - Post-TAVR deployment: Well-seated bioprosthetic aortic valve with mean gradient 2 mmHg. Very trivial aortic insufficiency. Mild mitral regurgitation. EF remained 30-35%.  PORTABLE CHEST X-RAY - 12/21/12  IMPRESSION:  1. Interval postsurgical changes of percutaneous aortic valve  replacement.  2. New small left pleural effusion and associated left lower lobe  atelectasis.  3. The tip of the right IJ approach [name]-Ganz catheter has an  atypical configuration and is likely within a segmental branch of  the left lower lobe pulmonary artery. Recommend withdrawing  several centimeters for more central location.  These results were called by telephone on 12/21/2012 at 04:28 p.m. to  the patient's nurse, Iona Hansen, who verbally acknowledged these  results.    4. Other support apparatus in satisfactory position as detailed  Above.  TRANSTHORACIC ECHOCARDIOGRAM - 12/22/12  - Left ventricle: The cavity size was mildly dilated. Wall thickness was normal. Systolic function was moderately reduced. The estimated ejection fraction was in the range of 35% to 40%. Mild hypokinesis of the basalanteroseptal myocardium. - Aortic valve: Valve area: 2.4cm^2(VTI). Valve area: 2.52cm^2 (Vmax).  - Mitral valve: Mild regurgitation. - Left atrium: The atrium was mildly to moderately dilated. - Pulmonary arteries: PA peak pressure: 37mm Hg (S).  PORTABLE CHEST X-RAY - 12/22/12  IMPRESSION:  Intervalworsening of aeration at the bases which could represent  atelectasis after extubation or aspiration.  PORTABLE CHEST X-RAY - 12/23/12  IMPRESSION:  Decreased bibasilar atelectasis. Stable cardiomegaly.   LEFT FOOT 2 VIEW X-RAY - 12/25/12  IMPRESSION:  Suspicion of a transverse fracture of the distal phalanx of the great toe. Suboptimally evaluated secondary to lack of oblique image, extent of degenerative change and hammertoe deformities. Consider dedicated three-view series to include oblique images.  RIGHT FOOT 2 VIEW X-RAY - 12/25/12  IMPRESSION:  No acute osseous abnormality.  LUMBAR SPINE 2 VIEW X-RAY - 12/25/12  IMPRESSION:  Advance spondylosis and spinal curvature, without acute osseous finding. Atherosclerosis.  SACRUM/COCCYX X-RAY - 12/25/12  IMPRESSION:  Advanced lower lumbar and lumbosacral degenerative disc disease. Atypical but grossly similar appearance of the upper sacrum on the lateral view. If high clinical concern of injury, consider CT. Osteopenia.  History of Present Illness [name] is a 79 y.o. female who was admitted to [facility] with the above problem list.   She is a 79 yo female with PMHx s/f critical  AS, tachy-brady syndrome s/p [employer] PPM with resulting chronic combined CHF (EF 35%), PAF (on chronic Coumadin  anticoagulation), h/o PE, CKD (stage III), OSA, h/o CVA, dementia, neuropathy, HTN and HLD who has been followed by Dr. Excell Seltzer for severe low-gradient AS.   She had previously documented moderate AS and HFpEF, but began to experience progressive shortness of breath with minimal activity and then at rest. She underwent repeat echocardiography in 09/2012 revealing newly reduced EF (35%), diffuse hypokinesis, grade 1 diastolic dysfunction, mild LVH, mild MR, mild LA dilatation, RV systolic dysfunction, mod-severe AS- difficult to appreciate AS d/t significant LV dysfunction. Follow-up dobutamine echo was recommended. She underwent diagnostic R/LHC 10/18/12 to r/o ischemic etiologies attributing to LV dysfunction and assess R sided hemodynamics. This indicated mild nonobstructive CAD, moderate AS with ? Low gradient (difficult to cross with guidewire), normal R heart HD and severe LV dysfunction. Follow-up dobutamine echo revealed valve area 0.76cm^2 (VTI), 0.83cm^2 (Vmax) and true, severe AS; not pseudostenosis. The decision was then made to undergo pre-op testing for possible TAVR. A cardiac gated CT was then obtained revealing a type I bicuspid aortic valve with fusion of the L and R coronary cusps and a normal appearing AV annulus. CT-A to define pelvic anatomy was obtained. The risks, benefits and details of the procedure were explained to the patient. It was explained that although there was no RCT data or FDA approval for TAVR in patients with bicuspid AVs, her valve morphology did present favorable characteristics and felt to be clinically indicated by Dr. Excell Seltzer given her NYHA class IV symptoms. Full risks and benefits were outlined in detail by Drs. Excell Seltzer and [place] on separate follow-up visits. She wished to proceed, Coumadin was held for several days, and she presented on 12/21/12 for this procedure.  Hospital Course   She was informed, consented and prepped for TAVR. For full detiails, please refer to the  operative note. This resulted in the successful transfemoral placement of an Edwards Sapien XT THV. She underwent intra-op TEE revealing a well-seated bioprosthetic aortic valve with mean gradient 2 mmHg, trivial AI, mild MR, EF 30-35%. She tolerated the procedure well without complications. She was transferred to ICU in stable condition.   She was noted to be hypertensive and was started on a NTG gtt. This was eventually weaned and she remained normotensive. She was extubated successfully shortly post-op. Follow-up portable CXR indicated atypical appearing [name] catheter placement, left lower lobe atelectasis and small pleural effusion. [name] and arterial lines were withdrawn. She did develop a non-productive cough and mild fever POD1. Follow-up CXR indicated reduced lung aeration related to atelectasis. Incentive spirometry was advised. Given concern for aspiration, empiric cefepime was added. Lung aeration improved as noted on follow-up CXR. Speech evaluation was obtained and directed appropriate liquid consistency to avoid the risk of aspiration. She was evaluated by PT/OT. She initially had difficulty ambulating due to significant bilateral foot pain. Follow-up radiographs indicated at left toe fracture which the patient attributed to a fall prior at home prior to surgery. She was eventually able to be assessed functionally, and SNF placement was recommended. She continued to recover appropriately and outpatient medications were gradually restarted. She did have evidence of a mild azotemia (Cr trending up to 1.61). Torsemide was held temporarily with improvement in Cr (1.32). She did become mildly volume overloaded requiring IV Lasix. She diuresed appropriately and renal function remained stable. She did have ABL anemia (Hgb 9.6/Hct 30.3) and thrombocytopenia (PLT 75K). These hematologic changes were attributed  to the TAVR procedure. Aspirin was reduced to 81mg . Serial CBCs were monitored and revealed an  improvement in Hgb/Hct (10.3/30.3) and PLT (126K). She did have small superior left femoral surgical site dehiscence with serosanguinous drainage. This was repaired by suturing performed by TCTS. She continued to recover. She remained afebrile, cough improved and antibiotics were discontinued. Coumadin was restarted with dosing managed by pharmacy. She was eventually transferred to telemetry.   She was evaluated by Dr. Excell Seltzer today who deemed the patient stable for discharge. She has requested SNF placement at [facility], which has been arranged. INR today is 1.53. She will continue Coumadin 4mg  PO daily with a repeat INR and CBC checked on Friday, Sept. 12. No Lovenox bridging indicated per Dr. Excell Seltzer. These results will be faxed to her PCP Dr. Pearson Grippe who manages her Coumadin and to Dr. Excell Seltzer at our office. She will be discharged on the medication regimen outlined below. Transitional care follow-up has been arranged as noted below. Post-TAVR echo and follow-up with Dr. Excell Seltzer is scheduled for next month. She has been advised to follow-up with her PCP for general post-hospital follow-up. This information, including post-AVR care instructions, has been clearly outlined in the discharge AVS.   Discharge Vitals:  Blood pressure 138/73, pulse 62, temperature 97.5 F (36.4 C), temperature source Oral, resp. rate 18, height 4\' 10"  (1.473 m), weight 71.7 kg (158 lb 1.1 oz), SpO2 95.00%.   Labs: Recent Labs     12/27/12  0442  WBC  6.4  HGB  10.3*  HCT  30.3*  MCV  92.7  PLT  126*    Recent Labs Lab 12/26/12 0505 12/27/12 0442 12/28/12 0647  NA 140 140 139  K 4.0 4.1 4.2  CL 104 101 100  CO2 26 31 29   BUN 33* 36* 33*  CREATININE 1.61* 1.48* 1.32*  CALCIUM 8.7 8.9 9.2  GLUCOSE 95 96 91   Disposition:  Discharge Orders   Future Appointments Provider Department Dept Phone   01/07/2013 2:30 PM Rosalio Macadamia, NP [facility] Main Office [place]) [phone]   01/19/2013 1:00 PM  [facility]-Echo Echo 1 [facility] SITE 3 ECHO LAB [phone]   01/19/2013 2:30 PM Tonny Bollman, MD [facility] Main Office [place]) [phone]   03/23/2013 2:00 PM [facility] Device 1 [facility] Main Office [place]) [phone]   Future Orders Complete By Expires   Diet - low sodium heart healthy  As directed    Increase activity slowly  As directed          Follow-up Information   Follow up with Norma Fredrickson, NP On 01/07/2013. (At 2:30 PM for post-hospital follow-up. )    Specialty:  Nurse Practitioner   Contact information:   [address] [place] [address] [phone]       Follow up with [facility] On 01/19/2013. (At 1:00 PM for repeat echocardiogram. )    Contact information:   [address] [place] [address]       Follow up with Tonny Bollman, MD On 01/19/2013. (At 2:30 PM for follow-up. )    Specialty:  Cardiology   Contact information:   [address] [place] [address] [phone]       Follow up with Pearson Grippe, MD. Schedule an appointment as soon as possible for a visit in 1 week. (For general post-hospital follow-up. )    Specialty:  Internal Medicine   Contact information:   [phone]  [employer] [address] [place] [address] [phone]       Discharge Medications:    Medication List    STOP taking these medications       mupirocin ointment 2 %  Commonly known as:  BACTROBAN      TAKE these medications       alendronate 70 MG tablet  Commonly known as:  FOSAMAX  Take 70 mg by mouth once a week. Take with a full glass of water on an empty stomach.     amiodarone 200 MG tablet  Commonly known as:  PACERONE  Take 100 mg by mouth daily.     aspirin 81 MG EC tablet  Take 1 tablet (81 mg total) by mouth daily.     BIOTIN 5000 PO  Take 5,000 mcg by mouth daily.     calcitRIOL 0.25 MCG capsule  Commonly known as:  ROCALTROL  Take 0.25 mcg by  mouth daily.     carvedilol 25 MG tablet  Commonly known as:  COREG  Take 0.5 tablets (12.5 mg total) by mouth 2 (two) times daily with a meal.     cetirizine 10 MG tablet  Commonly known as:  ZYRTEC  Take 10 mg by mouth daily.     colchicine 0.6 MG tablet  Take 0.6 mg by mouth every other day.     docusate sodium 50 MG capsule  Commonly known as:  COLACE  Take by mouth daily.     donepezil 10 MG tablet  Commonly known as:  ARICEPT  Take 10 mg by mouth at bedtime.     DULoxetine 60 MG capsule  Commonly known as:  CYMBALTA  Take 60 mg by mouth daily.     esomeprazole 40 MG capsule  Commonly known as:  NEXIUM  Take 40 mg by mouth daily before breakfast.     guaiFENesin 600 MG 12 hr tablet  Commonly known as:  MUCINEX  Take 1 tablet (600 mg total) by mouth 2 (two) times daily.     hydrALAZINE 10 MG tablet  Commonly known as:  APRESOLINE  Take 10 mg by mouth 3 (three) times daily.     HYDROcodone-acetaminophen 10-325 MG per tablet  Commonly known as:  NORCO  Take 1 tablet by mouth every 6 (six) hours as needed for pain.     levothyroxine 100 MCG tablet  Commonly known as:  SYNTHROID, LEVOTHROID  Take 100 mcg by mouth daily.     Linaclotide 290 MCG Caps capsule  Commonly known as:  LINZESS  Take 290 mcg by mouth daily.     memantine 10 MG tablet  Commonly known as:  NAMENDA  Take 10 mg by mouth 2 (two) times daily.     multivitamin tablet  Take 1 tablet by mouth daily.     nitroGLYCERIN 0.4 MG SL tablet  Commonly known as:  NITROSTAT  Place 0.4 mg under the tongue every 5 (five) minutes as needed for chest pain.     pravastatin 40 MG tablet  Commonly known as:  PRAVACHOL  Take 40 mg by mouth daily.     pregabalin 75 MG capsule  Commonly known as:  LYRICA  Take 75 mg by mouth 2 (two) times daily.     telmisartan 40 MG tablet  Commonly known as:  MICARDIS  Take 20 mg by mouth daily.     torsemide 20 MG tablet  Commonly known as:  DEMADEX  Take 20 mg  by mouth daily.  ULORIC 40 MG tablet  Generic drug:  febuxostat  Take 40 mg by mouth daily.     vitamin B-12 1000 MCG tablet  Commonly known as:  CYANOCOBALAMIN  Take 1,000 mcg by mouth 2 (two) times a week.     Vitamin D 1000 UNITS capsule  Take 2,000 Units by mouth daily.     warfarin 4 MG tablet  Commonly known as:  COUMADIN  Take 1 tablet (4 mg total) by mouth daily. Take 1/2 tablet on Tues and Fri. Take 1 tablet all other days       Outstanding Labs/Studies: PT/INR, CBC to be drawn at [facility] on 12/31/12; follow-up TTE on 10/1 at [facility] office  Duration of Discharge Encounter: Greater than 30 minutes including physician time.  Signed, R. Hurman Horn, PA-C 12/28/2012, 12:42 PM

## 2012-12-28 NOTE — Progress Notes (Signed)
Clinical Social Work Department CLINICAL SOCIAL WORK PLACEMENT NOTE 12/28/2012  Patient:  [name], [name]  Account Number:  [id number] Admit date:  12/21/2012  Clinical Social Worker:  Carren Rang  Date/time:  12/24/2012 10:17 AM  Clinical Social Work is seeking post-discharge placement for this patient at the following level of care:   SKILLED NURSING   (*CSW will update this form in Epic as items are completed)   12/24/2012  Patient/family provided with [facility] Department of Clinical Social Work's list of facilities offering this level of care within the geographic area requested by the patient (or if unable, by the patient's family).  12/24/2012  Patient/family informed of their freedom to choose among providers that offer the needed level of care, that participate in Medicare, Medicaid or managed care program needed by the patient, have an available bed and are willing to accept the patient.  12/24/2012  Patient/family informed of [facility]' ownership interest in [facility], as well as of the fact that they are under no obligation to receive care at this facility.  PASARR submitted to EDS on 12/24/2012 PASARR number received from EDS on 12/24/2012  FL2 transmitted to all facilities in geographic area requested by pt/family on  12/24/2012 FL2 transmitted to all facilities within larger geographic area on   Patient informed that his/her managed care company has contracts with or will negotiate with  certain facilities, including the following:     Patient/family informed of bed offers received:  12/25/2012 Patient chooses bed at [facility] Physician recommends and patient chooses bed at    Patient to be transferred to [facility] on  12/28/2012 Patient to be transferred to facility by EMS  The following physician request were entered in Epic:   Additional Comments:  Maree Krabbe, MSW,  Theresia Majors [phone]

## 2012-12-28 NOTE — Progress Notes (Signed)
CSW confirmed bed at [facility] today for dc. FL2 on chart to be signed by MD.  Maree Krabbe, MSW, Theresia Majors [phone]

## 2012-12-28 NOTE — Progress Notes (Signed)
Clinical Social Worker facilitated patient discharge by contacting the patient, family and facility, [facility]. Patient agreeable to this plan and arranging transport via EMS . CSW will sign off, as social work intervention is no longer needed.  Nochum Fenter, MSW, LCSWA [phone]  

## 2012-12-29 NOTE — Telephone Encounter (Signed)
[name] contacted regarding discharge from [facility] on 9/8.  Patient understands to follow up with provider Norma Fredrickson, NP on 9/19 at 2:30 at [facility]. Patient understands discharge instructions? yes Patient understands medications and regiment? yes Patient understands to bring all medications to this visit? yes  Care provider states all discharge instructions have been implemented and patient is doing well; states she will notify patient that we called

## 2012-12-31 ENCOUNTER — Encounter (HOSPITAL_COMMUNITY): Payer: Self-pay | Admitting: Cardiovascular Disease

## 2013-01-07 ENCOUNTER — Encounter: Payer: Self-pay | Admitting: Nurse Practitioner

## 2013-01-07 ENCOUNTER — Ambulatory Visit (INDEPENDENT_AMBULATORY_CARE_PROVIDER_SITE_OTHER): Payer: PRIVATE HEALTH INSURANCE | Admitting: Nurse Practitioner

## 2013-01-07 VITALS — BP 140/80 | HR 81 | Ht <= 58 in | Wt 154.4 lb

## 2013-01-07 DIAGNOSIS — I4891 Unspecified atrial fibrillation: Secondary | ICD-10-CM

## 2013-01-07 DIAGNOSIS — I480 Paroxysmal atrial fibrillation: Secondary | ICD-10-CM

## 2013-01-07 MED ORDER — AMIODARONE HCL 200 MG PO TABS: 100.0000 mg | ORAL_TABLET | Freq: Every day | ORAL | Status: DC

## 2013-01-07 NOTE — Patient Instructions (Addendum)
Please verify if the patient is on coumadin/warfarin - this is NOT on her medicine list - please call us and let us know one way or the other  Cut the amiodarone back to just 1/2 tablet (100 mg) daily  No other medicine changes.   See Dr. Excell Seltzer as planned in October with an echocardiogram  Call the [facility] office at [phone] if you have any questions, problems or concerns.

## 2013-01-07 NOTE — Telephone Encounter (Signed)
New Problem  Calling to verify that the patient is on coumadin.. Coumadin was held yesterday (9/18) and will be held today(9/19)// will begin 4mg  of coumadin every Sat, Sun, Wed and Thurs. 2mg  of coumadin every Tues and Fri/// pt-inr will be rechecked on 9/25.

## 2013-01-07 NOTE — Progress Notes (Addendum)
Delfin Gant Date of Birth: 29-Dec-1932 Medical Record #[id number]  History of Present Illness: [name] is seen back today for a post hospital visit. This is a TCM visit. Seen for Dr. Excell Seltzer and Dr. Ladona Ridgel. She has a history of critical AS, tachybrady with PPM in place, combined CHF with an EF of 35%, PAF, chronic coumadin, h/o PE, CKD stage III, OSA, h/o CVA, dementia, neuropathy, HTN and HLD.   She had previously documented moderate AS and HFpEF, but began to experience progressive shortness of breath with minimal activity and then at rest. She underwent repeat echocardiography in 09/2012 revealing newly reduced EF (35%), diffuse hypokinesis, grade 1 diastolic dysfunction, mild LVH, mild MR, mild LA dilatation, RV systolic dysfunction, mod-severe AS- difficult to appreciate AS d/t significant LV dysfunction. Follow-up dobutamine echo was recommended. She underwent diagnostic R/LHC 10/18/12 to r/o ischemic etiologies attributing to LV dysfunction and assess R sided hemodynamics. This indicated mild nonobstructive CAD, moderate AS with ? Low gradient (difficult to cross with guidewire), normal R heart HD and severe LV dysfunction. Follow-up dobutamine echo revealed valve area 0.76cm^2 (VTI), 0.83cm^2 (Vmax) and true, severe AS; not pseudostenosis. The decision was then made to undergo pre-op testing for possible TAVR. A cardiac gated CT was then obtained revealing a type I bicuspid aortic valve with fusion of the L and R coronary cusps and a normal appearing AV annulus. CT-A to define pelvic anatomy was obtained. The risks, benefits and details of the procedure were explained to the patient. It was explained that although there was no RCT data or FDA approval for TAVR in patients with bicuspid AVs, her valve morphology did present favorable characteristics and felt to be clinically indicated by Dr. Excell Seltzer given her NYHA class IV symptoms. Full risks and benefits were outlined in detail by Drs. Excell Seltzer  and [place] on separate follow-up visits. She wished to proceed, Coumadin was held for several days, and she presented on 12/21/12 for this procedure - she had successful transfemoral placement of an Edwards Sapien XT THV. Intra-op TEE showed a well seated AVR with EF of 30 to 35%.   Post op course was complicated by hypertension, cough, possible aspiration and difficulty walking due to bilateral foot pain. Had mild azotemia and acute blood loss anemia. Also had a small superior left femoral surgical site dehiscence which was sutured by TCTS. Went to [facility] for rehab. Back on her coumadin - managed by Dr. Selena Batten.   Comes in today. Here with her son. In a wheelchair. Doing ok.  She is really not able to give much history. Pleasantly confused. Son thinks she is doing ok. Some swelling but "no worse than normal". Tells me she has been getting "shots"- not sure what for. Breathing seems to have improved. Coumadin not on her medicine list. She is on full dose of amiodarone - was to be on 100 mg at discharge - son does know that she was on 100 mg pre op.   Current Outpatient Prescriptions  Medication Sig Dispense Refill  . alendronate (FOSAMAX) 70 MG tablet Take 70 mg by mouth once a week. Take with a full glass of water on an empty stomach.       Marland Kitchen amiodarone (PACERONE) 200 MG tablet Take 0.5 tablets (100 mg total) by mouth daily.      Marland Kitchen aspirin EC 81 MG EC tablet Take 1 tablet (81 mg total) by mouth daily.      Marland Kitchen BIOTIN 5000 PO Take 5,000 mcg by  mouth daily.       . calcitRIOL (ROCALTROL) 0.25 MCG capsule Take 0.25 mcg by mouth daily.       . carvedilol (COREG) 25 MG tablet Take 0.5 tablets (12.5 mg total) by mouth 2 (two) times daily with a meal.  30 tablet  2  . cetirizine (ZYRTEC) 10 MG tablet Take 10 mg by mouth daily.       . Cholecalciferol (VITAMIN D) 1000 UNITS capsule Take 2,000 Units by mouth daily.       . colchicine 0.6 MG tablet Take 0.6 mg by mouth every other day.      . docusate sodium  (COLACE) 50 MG capsule Take by mouth daily.       Marland Kitchen donepezil (ARICEPT) 10 MG tablet Take 10 mg by mouth at bedtime.       . DULoxetine (CYMBALTA) 60 MG capsule Take 60 mg by mouth daily.      Marland Kitchen esomeprazole (NEXIUM) 40 MG capsule Take 40 mg by mouth daily before breakfast.        . guaiFENesin (MUCINEX) 600 MG 12 hr tablet Take 1 tablet (600 mg total) by mouth 2 (two) times daily.  28 tablet  2  . hydrALAZINE (APRESOLINE) 10 MG tablet Take 10 mg by mouth 3 (three) times daily.       Marland Kitchen HYDROcodone-acetaminophen (NORCO) 10-325 MG per tablet Take 1 tablet by mouth every 6 (six) hours as needed for pain.       Marland Kitchen levothyroxine (SYNTHROID, LEVOTHROID) 100 MCG tablet Take 100 mcg by mouth daily.       . Linaclotide 290 MCG CAPS Take 290 mcg by mouth daily.      . memantine (NAMENDA) 10 MG tablet Take 10 mg by mouth 2 (two) times daily.       . Multiple Vitamin (MULTIVITAMIN) tablet Take 1 tablet by mouth daily.        . nitroGLYCERIN (NITROSTAT) 0.4 MG SL tablet Place 0.4 mg under the tongue every 5 (five) minutes as needed for chest pain.       . NON FORMULARY 2 Liters oxygen      . pravastatin (PRAVACHOL) 40 MG tablet Take 40 mg by mouth daily.       . pregabalin (LYRICA) 75 MG capsule Take 75 mg by mouth 2 (two) times daily.       Marland Kitchen telmisartan (MICARDIS) 40 MG tablet Take 20 mg by mouth daily.       Marland Kitchen torsemide (DEMADEX) 20 MG tablet Take 20 mg by mouth daily.      Marland Kitchen ULORIC 40 MG tablet Take 40 mg by mouth daily.       . vitamin B-12 (CYANOCOBALAMIN) 1000 MCG tablet Take 1,000 mcg by mouth 2 (two) times a week.       . warfarin (COUMADIN) 4 MG tablet Take 1 tablet (4 mg total) by mouth daily. Take 1/2 tablet on Tues and Fri. Take 1 tablet all other days  30 tablet  3   No current facility-administered medications for this visit.   Facility-Administered Medications Ordered in Other Visits  Medication Dose Route Frequency Provider Last Rate Last Dose  . DOBUTamine (DOBUTREX) 1,000 mcg/mL in  dextrose 5% 250 mL infusion  20 mcg/kg/min (Order-Specific) Intravenous Continuous Marinus Maw, MD 81.8 mL/hr at 10/26/12 1630 20 mcg/kg/min at 10/26/12 1630  . sodium bicarbonate first hour bolus via infusion   Intravenous Once Massie Maroon, MD  Allergies  Allergen Reactions  . Codeine Hives  . Darvocet [Propoxyphene-Acetaminophen] Hives  . Nubain [Nalbuphine Hcl] Hives    Past Medical History  Diagnosis Date  . Syncope   . Symptomatic bradycardia   . Acute on chronic renal failure   . Chronic lower back pain   . Nonischemic cardiomyopathy   . HTN (hypertension)   . HLD (hyperlipidemia)   . Atrial fibrillation     tachy-brady syndrome with <1% recurrent PAF since pacemaker placement  . CKD (chronic kidney disease)   . H/O: stroke   . Pulmonary embolism     HISTORY OF, the pt. had a recurrent bilateral pulmonary emboli in 2005, on warfarin therapy and at which time she under went implantation of IVC filter  . Anemia   . Dementia   . Gastroesophageal reflux disease   . Aortic stenosis 10/13/2012    Low EF, low gradient with severe aortic stenosis confirmed by dobutamine stress echocardiogram s/p TAVR 12/2012  . PONV (postoperative nausea and vomiting)   . Osteoarthritis   . Neuropathy   . Spinal stenosis   . Sleep apnea     uses oxygen at night and PRN  . Symptomatic bradycardia 2012    s/p Medtronic PPM  . Chronic combined systolic and diastolic CHF (congestive heart failure)   . Fibromyalgia     Past Surgical History  Procedure Laterality Date  . Pacemaker insertion      Medtronic  . Transcatheter aortic valve replacement, transfemoral  12/21/2012    a. 29mm Edwards Sapien XT transcatheter heart valve placed via open left transfemoral approach b. Intra-op TEE: well-seated bioprosthetic aortic valve with mean gradient 2 mmHg, trivial AI, mild MR, EF 30-35%  . Transcatheter aortic valve replacement, transfemoral N/A 12/21/2012    Procedure: TRANSCATHETER AORTIC  VALVE REPLACEMENT, TRANSFEMORAL;  Surgeon: Tonny Bollman, MD;  Location: [facility] OR;  Service: Open Heart Surgery;  Laterality: N/A;  . Intraoperative transesophageal echocardiogram N/A 12/21/2012    Procedure: INTRAOPERATIVE TRANSESOPHAGEAL ECHOCARDIOGRAM;  Surgeon: Tonny Bollman, MD;  Location: [facility] OR;  Service: Open Heart Surgery;  Laterality: N/A;  . Central venous catheter insertion Left 12/21/2012    Procedure: INSERTION CENTRAL LINE ADULT;  Surgeon: Tonny Bollman, MD;  Location: [facility] OR;  Service: Open Heart Surgery;  Laterality: Left;    History  Smoking status  . Never Smoker   Smokeless tobacco  . Never Used    History  Alcohol Use No    History reviewed. No pertinent family history.  Review of Systems: The review of systems is per the HPI.  All other systems were reviewed and are negative.  Physical Exam: BP 140/80  Pulse 81  Ht 4\' 9"  (1.448 m)  Wt 154 lb 6.4 oz (70.035 kg)  BMI 33.4 kg/m2  SpO2 100% Patient is alert and in no acute distress. She is pleasantly confused. Skin is warm and dry. Color is normal.  HEENT is unremarkable. Normocephalic/atraumatic. PERRL. Sclera are nonicteric. Neck is supple. No masses. No JVD. Lungs are clear. Cardiac exam shows a regular rate and rhythm. No real murmur noted. Valve sounds ok. Abdomen is soft. Extremities are with 1+ edema. Gait is not tested. No gross neurologic deficits noted.  LABORATORY DATA: EKG pending  Lab Results  Component Value Date   WBC 6.4 12/27/2012   HGB 10.3* 12/27/2012   HCT 30.3* 12/27/2012   PLT 126* 12/27/2012   GLUCOSE 91 12/28/2012   CHOL  Value: 89  ATP III CLASSIFICATION:  <200     mg/dL   Desirable  409-811  mg/dL   Borderline High  >=914    mg/dL   High        7/82/9562   TRIG 64 06/16/2010   HDL 49 06/16/2010   LDLCALC  Value: 27        Total Cholesterol/HDL:CHD Risk Coronary Heart Disease Risk Table                     Men   Women  1/2 Average Risk   3.4   3.3  Average Risk       5.0   4.4  2 X Average  Risk   9.6   7.1  3 X Average Risk  23.4   11.0        Use the calculated Patient Ratio above and the CHD Risk Table to determine the patient's CHD Risk.        ATP III CLASSIFICATION (LDL):  <100     mg/dL   Optimal  130-865  mg/dL   Near or Above                    Optimal  130-159  mg/dL   Borderline  784-696  mg/dL   High  >295     mg/dL   Very High 2/84/1324   ALT 17 12/16/2012   AST 27 12/16/2012   NA 139 12/28/2012   K 4.2 12/28/2012   CL 100 12/28/2012   CREATININE 1.32* 12/28/2012   BUN 33* 12/28/2012   CO2 29 12/28/2012   TSH 1.206 06/19/2010   INR 1.53* 12/28/2012   HGBA1C 5.5 12/16/2012     Assessment / Plan: 1. S/P TAVR - seems to be doing ok - has had labs about 10 days ago - no need to recheck today.   2. PAF - in sinus on exam - check EKG today - I am cutting her back to 100 mg of amiodarone. We need to make sure she is on her coumadin - it is NOT listed on her medicine sheet.   3. HTN - blood pressure looks ok today.   4. Dementia - this seems to be her most limiting factor in my opinion.   5. LV dysfunction - will have repeat echo in about 10 days - for now, no changes with her medicines. Not sure how aggressive Dr. Excell Seltzer would like to be with her medicines.   She has an echo for early October with a visit with Dr. Excell Seltzer. No other changes today.   Patient is agreeable to this plan and will call if any problems develop in the interim.   Rosalio Macadamia, RN, ANP-C [facility] [address] [place], [place]  [address]   Addendum:  EKG today shows a paced rhythm.

## 2013-01-07 NOTE — Telephone Encounter (Signed)
I spoke with Pollyann Glen RN and made him aware that this pt's coumadin is managed by Dr Selena Batten her PCP. I have given him the phone number for Dr Selena Batten.

## 2013-01-10 ENCOUNTER — Other Ambulatory Visit: Payer: Self-pay | Admitting: Nurse Practitioner

## 2013-01-19 ENCOUNTER — Ambulatory Visit (INDEPENDENT_AMBULATORY_CARE_PROVIDER_SITE_OTHER): Payer: PRIVATE HEALTH INSURANCE | Admitting: Cardiovascular Disease

## 2013-01-19 ENCOUNTER — Encounter: Payer: Self-pay | Admitting: Cardiovascular Disease

## 2013-01-19 ENCOUNTER — Other Ambulatory Visit (HOSPITAL_COMMUNITY): Payer: Self-pay | Admitting: Cardiovascular Disease

## 2013-01-19 ENCOUNTER — Ambulatory Visit (HOSPITAL_COMMUNITY): Payer: PRIVATE HEALTH INSURANCE | Attending: Cardiovascular Disease | Admitting: Radiology

## 2013-01-19 VITALS — BP 160/60 | HR 63 | Wt 154.0 lb

## 2013-01-19 DIAGNOSIS — I079 Rheumatic tricuspid valve disease, unspecified: Secondary | ICD-10-CM | POA: Insufficient documentation

## 2013-01-19 DIAGNOSIS — Z8673 Personal history of transient ischemic attack (TIA), and cerebral infarction without residual deficits: Secondary | ICD-10-CM | POA: Insufficient documentation

## 2013-01-19 DIAGNOSIS — F0390 Unspecified dementia without behavioral disturbance: Secondary | ICD-10-CM | POA: Insufficient documentation

## 2013-01-19 DIAGNOSIS — I509 Heart failure, unspecified: Secondary | ICD-10-CM | POA: Insufficient documentation

## 2013-01-19 DIAGNOSIS — I359 Nonrheumatic aortic valve disorder, unspecified: Secondary | ICD-10-CM

## 2013-01-19 DIAGNOSIS — Z86711 Personal history of pulmonary embolism: Secondary | ICD-10-CM | POA: Insufficient documentation

## 2013-01-19 DIAGNOSIS — I059 Rheumatic mitral valve disease, unspecified: Secondary | ICD-10-CM | POA: Insufficient documentation

## 2013-01-19 DIAGNOSIS — G589 Mononeuropathy, unspecified: Secondary | ICD-10-CM | POA: Insufficient documentation

## 2013-01-19 DIAGNOSIS — E785 Hyperlipidemia, unspecified: Secondary | ICD-10-CM | POA: Insufficient documentation

## 2013-01-19 DIAGNOSIS — I480 Paroxysmal atrial fibrillation: Secondary | ICD-10-CM

## 2013-01-19 DIAGNOSIS — I4891 Unspecified atrial fibrillation: Secondary | ICD-10-CM | POA: Insufficient documentation

## 2013-01-19 DIAGNOSIS — N189 Chronic kidney disease, unspecified: Secondary | ICD-10-CM | POA: Insufficient documentation

## 2013-01-19 DIAGNOSIS — I129 Hypertensive chronic kidney disease with stage 1 through stage 4 chronic kidney disease, or unspecified chronic kidney disease: Secondary | ICD-10-CM | POA: Insufficient documentation

## 2013-01-19 DIAGNOSIS — I379 Nonrheumatic pulmonary valve disorder, unspecified: Secondary | ICD-10-CM | POA: Insufficient documentation

## 2013-01-19 NOTE — Patient Instructions (Addendum)
Your physician recommends that you continue on your current medications as directed. Please refer to the Current Medication list given to you today.  Your physician recommends that you schedule a follow-up appointment in: 8 WEEKS with Dr Ladona Ridgel  Your physician recommends that you return for lab work in: 8 WEEKS (LIVER, BMP, TSH)

## 2013-01-19 NOTE — Progress Notes (Signed)
HPI:  79 year old woman presenting for followup evaluation. She has a history of cardiomyopathy with chronic systolic heart failure and severe aortic stenosis. She underwent transfemoral TAVR 12/21/2012 with a 29 mm Edwards Sapien XT valve. Her postoperative course was uncomplicated. Other problems include paroxysmal atrial fibrillation, hypertension, and dementia. She is chronically anticoagulated with warfarin.  She comes in with her son today. She is still at [facility] in ST-[facility] rehab. She is making slow progress, still limited by generalized weakness. She can't walk very far. Exertional dyspnea is stable. No Chest pain, palpitations, orthopnea, or PND. She has bilaterally leg swelling without change from baseline. She's had no fever, chills, or other complaints.  Outpatient Encounter Prescriptions as of 01/19/2013  Medication Sig Dispense Refill  . alendronate (FOSAMAX) 70 MG tablet Take 70 mg by mouth once a week. Take with a full glass of water on an empty stomach.       Marland Kitchen amiodarone (PACERONE) 200 MG tablet Take 0.5 tablets (100 mg total) by mouth daily.      Marland Kitchen aspirin EC 81 MG EC tablet Take 1 tablet (81 mg total) by mouth daily.      Marland Kitchen BIOTIN 5000 PO Take 5,000 mcg by mouth daily.       . calcitRIOL (ROCALTROL) 0.25 MCG capsule Take 0.25 mcg by mouth daily.       . carvedilol (COREG) 25 MG tablet Take 0.5 tablets (12.5 mg total) by mouth 2 (two) times daily with a meal.  30 tablet  2  . cetirizine (ZYRTEC) 10 MG tablet Take 10 mg by mouth daily.       . Cholecalciferol (VITAMIN D) 1000 UNITS capsule Take 2,000 Units by mouth daily.       . colchicine 0.6 MG tablet Take 0.6 mg by mouth every other day.      . docusate sodium (COLACE) 50 MG capsule Take by mouth daily.       Marland Kitchen donepezil (ARICEPT) 10 MG tablet Take 10 mg by mouth at bedtime.       . DULoxetine (CYMBALTA) 60 MG capsule Take 60 mg by mouth daily.      Marland Kitchen esomeprazole (NEXIUM) 40 MG capsule Take 40 mg by mouth daily  before breakfast.        . guaiFENesin (MUCINEX) 600 MG 12 hr tablet Take 1 tablet (600 mg total) by mouth 2 (two) times daily.  28 tablet  2  . hydrALAZINE (APRESOLINE) 10 MG tablet Take 10 mg by mouth 3 (three) times daily.       Marland Kitchen HYDROcodone-acetaminophen (NORCO) 10-325 MG per tablet Take 1 tablet by mouth every 6 (six) hours as needed for pain.       Marland Kitchen levothyroxine (SYNTHROID, LEVOTHROID) 100 MCG tablet Take 100 mcg by mouth daily.       . Linaclotide 290 MCG CAPS Take 290 mcg by mouth daily.      . memantine (NAMENDA) 10 MG tablet Take 10 mg by mouth 2 (two) times daily.       . Multiple Vitamin (MULTIVITAMIN) tablet Take 1 tablet by mouth daily.        . nitroGLYCERIN (NITROSTAT) 0.4 MG SL tablet Place 0.4 mg under the tongue every 5 (five) minutes as needed for chest pain.       . NON FORMULARY 2 Liters oxygen      . pravastatin (PRAVACHOL) 40 MG tablet Take 40 mg by mouth daily.       . pregabalin (LYRICA)  75 MG capsule Take 75 mg by mouth 2 (two) times daily.       Marland Kitchen telmisartan (MICARDIS) 40 MG tablet Take 20 mg by mouth daily.       Marland Kitchen torsemide (DEMADEX) 20 MG tablet Take 20 mg by mouth daily.      Marland Kitchen ULORIC 40 MG tablet Take 40 mg by mouth daily.       . vitamin B-12 (CYANOCOBALAMIN) 1000 MCG tablet Take 1,000 mcg by mouth 2 (two) times a week.       . warfarin (COUMADIN) 4 MG tablet Take 1 tablet (4 mg total) by mouth daily. Take 1/2 tablet on Tues and Fri. Take 1 tablet all other days  30 tablet  3   Facility-Administered Encounter Medications as of 01/19/2013  Medication Dose Route Frequency Provider Last Rate Last Dose  . DOBUTamine (DOBUTREX) 1,000 mcg/mL in dextrose 5% 250 mL infusion  20 mcg/kg/min (Order-Specific) Intravenous Continuous Marinus Maw, MD 81.8 mL/hr at 10/26/12 1630 20 mcg/kg/min at 10/26/12 1630  . sodium bicarbonate first hour bolus via infusion   Intravenous Once Massie Maroon, MD        Allergies  Allergen Reactions  . Codeine Hives  . Darvocet  [Propoxyphene-Acetaminophen] Hives  . Nubain [Nalbuphine Hcl] Hives    Past Medical History  Diagnosis Date  . Syncope   . Symptomatic bradycardia   . Acute on chronic renal failure   . Chronic lower back pain   . Nonischemic cardiomyopathy   . HTN (hypertension)   . HLD (hyperlipidemia)   . Atrial fibrillation     tachy-brady syndrome with <1% recurrent PAF since pacemaker placement  . CKD (chronic kidney disease)   . H/O: stroke   . Pulmonary embolism     HISTORY OF, the pt. had a recurrent bilateral pulmonary emboli in 2005, on warfarin therapy and at which time she under went implantation of IVC filter  . Anemia   . Dementia   . Gastroesophageal reflux disease   . Aortic stenosis 10/13/2012    Low EF, low gradient with severe aortic stenosis confirmed by dobutamine stress echocardiogram s/p TAVR 12/2012  . PONV (postoperative nausea and vomiting)   . Osteoarthritis   . Neuropathy   . Spinal stenosis   . Sleep apnea     uses oxygen at night and PRN  . Symptomatic bradycardia 2012    s/p Medtronic PPM  . Chronic combined systolic and diastolic CHF (congestive heart failure)   . Fibromyalgia    ROS: Negative except as per HPI  BP 160/60  Pulse 63  Wt 69.854 kg (154 lb)  BMI 33.32 kg/m2  PHYSICAL EXAM: Pt is alert and oriented, NAD HEENT: normal Neck: JVP - mildly elevated, carotids 2+= without bruits Lungs: CTA bilaterally CV: RRR with paradoxically split S2 Abd: soft, NT, Positive BS, no hepatomegaly Ext: 1+ pretibial edema, distal pulses intact and equal. Left groin site with well-healing surgical incision. Skin: warm/dry no rash  ASSESSMENT AND PLAN: 1. Severe aortic stenosis s/p TAVR. She appears stable. I have reviewed her echo done just before this visit. Her valve function appears normal and I do not appreciate any aortic insufficiency. Her LVEF remains severely depressed. She is anticoagulated with warfarin and also takes aspirin 81 mg daily. Sutures at her  femoral cutdown site were removed today and the site appears to be healing well.  2. Chronic systolic heart failure. Will continue carvedilol, Micardis, hydralazine, and torsemide. She appears euvolemic today.  3. Paroxysmal atrial fibrillation. The patient is maintained on amiodarone 100 mg daily. She is anticoagulated with warfarin.  Will arrange followup with Dr. Ladona Ridgel in 2 months with labs before that visit to check her metabolic panel, LFTs, and thyroid function studies on amiodarone. We will see her back in the multidisciplinary valve clinic at one year.  Tonny Bollman 01/19/2013 3:28 PM

## 2013-01-19 NOTE — Progress Notes (Signed)
I spoke with [facility] and the pt's INR on 01/17/13 was 2.0 the pt is taking Coumadin 2.5mg  daily.

## 2013-01-19 NOTE — Progress Notes (Signed)
Echocardiogram performed.  

## 2013-03-15 ENCOUNTER — Encounter: Payer: Self-pay | Admitting: Internal Medicine

## 2013-03-15 ENCOUNTER — Other Ambulatory Visit (INDEPENDENT_AMBULATORY_CARE_PROVIDER_SITE_OTHER): Payer: PRIVATE HEALTH INSURANCE

## 2013-03-15 ENCOUNTER — Ambulatory Visit (INDEPENDENT_AMBULATORY_CARE_PROVIDER_SITE_OTHER): Payer: PRIVATE HEALTH INSURANCE | Admitting: Internal Medicine

## 2013-03-15 VITALS — BP 168/87 | HR 86 | Wt 158.0 lb

## 2013-03-15 DIAGNOSIS — Z950 Presence of cardiac pacemaker: Secondary | ICD-10-CM

## 2013-03-15 DIAGNOSIS — I480 Paroxysmal atrial fibrillation: Secondary | ICD-10-CM

## 2013-03-15 DIAGNOSIS — I350 Nonrheumatic aortic (valve) stenosis: Secondary | ICD-10-CM

## 2013-03-15 DIAGNOSIS — I4891 Unspecified atrial fibrillation: Secondary | ICD-10-CM

## 2013-03-15 DIAGNOSIS — I359 Nonrheumatic aortic valve disorder, unspecified: Secondary | ICD-10-CM

## 2013-03-15 DIAGNOSIS — I5043 Acute on chronic combined systolic (congestive) and diastolic (congestive) heart failure: Secondary | ICD-10-CM

## 2013-03-15 LAB — TSH: TSH: 2.74 u[IU]/mL (ref 0.35–5.50)

## 2013-03-15 LAB — BASIC METABOLIC PANEL
BUN: 55 mg/dL — ABNORMAL HIGH (ref 6–23)
Calcium: 9.3 mg/dL (ref 8.4–10.5)
Creatinine, Ser: 1.8 mg/dL — ABNORMAL HIGH (ref 0.4–1.2)
GFR: 28.5 mL/min — ABNORMAL LOW (ref >60.00)
Glucose, Bld: 84 mg/dL (ref 70–99)

## 2013-03-15 LAB — MDC_IDC_ENUM_SESS_TYPE_INCLINIC
Battery Remaining Longevity: 108 mo
Brady Statistic AS VP Percent: 0 %
Date Time Interrogation Session: 20141125161203
Lead Channel Impedance Value: 536 Ohm
Lead Channel Pacing Threshold Amplitude: 0.5 V
Lead Channel Pacing Threshold Amplitude: 0.75 V
Lead Channel Pacing Threshold Pulse Width: 0.4 ms
Lead Channel Sensing Intrinsic Amplitude: 2 mV
Lead Channel Sensing Intrinsic Amplitude: 4 mV
Lead Channel Setting Pacing Amplitude: 2.5 V

## 2013-03-15 LAB — HEPATIC FUNCTION PANEL: Albumin: 3.8 g/dL (ref 3.5–5.2)

## 2013-03-15 NOTE — Assessment & Plan Note (Signed)
Her Medtronic dual-chamber pacemaker is working normally. We'll plan to recheck in several months. 

## 2013-03-15 NOTE — Assessment & Plan Note (Signed)
She is status post percutaneous aortic valve replacement. She is doing well. Her exam today demonstrates no aortic stenosis murmur.

## 2013-03-15 NOTE — Progress Notes (Signed)
HPI [name] returns today for followup. She is a very pleasant 79 year old woman, with a history of worsening shortness of breath. This is been present for over a year. She was seen in our office and a 2-D echo was ordered which demonstrates left ventricular dysfunction with an ejection fraction of 35%. In addition she has at least moderately severe aortic stenosis. The actual gradient was difficult to know for certain with her left ventricular dysfunction. She has dyspnea with exertion which is class III. She denied anginal symptoms. She has only undergone percutaneous aortic valve replacement  With nice result.  Allergies  Allergen Reactions  . Codeine Hives  . Darvocet [Propoxyphene-Acetaminophen] Hives  . Nubain [Nalbuphine Hcl] Hives     Current Outpatient Prescriptions  Medication Sig Dispense Refill  . alendronate (FOSAMAX) 70 MG tablet Take 70 mg by mouth once a week. Take with a full glass of water on an empty stomach.       Marland Kitchen amiodarone (PACERONE) 200 MG tablet Take 0.5 tablets (100 mg total) by mouth daily.      Marland Kitchen aspirin EC 81 MG EC tablet Take 1 tablet (81 mg total) by mouth daily.      Marland Kitchen BIOTIN 5000 PO Take 5,000 mcg by mouth daily.       . calcitRIOL (ROCALTROL) 0.25 MCG capsule Take 0.25 mcg by mouth daily.       . carvedilol (COREG) 25 MG tablet Take 0.5 tablets (12.5 mg total) by mouth 2 (two) times daily with a meal.  30 tablet  2  . cetirizine (ZYRTEC) 10 MG tablet Take 10 mg by mouth daily.       . Cholecalciferol (VITAMIN D) 1000 UNITS capsule Take 2,000 Units by mouth daily.       . colchicine 0.6 MG tablet Take 0.6 mg by mouth every other day.      . docusate sodium (COLACE) 50 MG capsule Take by mouth daily.       Marland Kitchen donepezil (ARICEPT) 10 MG tablet Take 10 mg by mouth at bedtime.       . DULoxetine (CYMBALTA) 60 MG capsule Take 60 mg by mouth daily.      Marland Kitchen esomeprazole (NEXIUM) 40 MG capsule Take 40 mg by mouth daily before breakfast.        . hydrALAZINE  (APRESOLINE) 10 MG tablet Take 10 mg by mouth 3 (three) times daily.       Marland Kitchen HYDROcodone-acetaminophen (NORCO) 10-325 MG per tablet Take 1 tablet by mouth every 6 (six) hours as needed for pain.       Marland Kitchen levothyroxine (SYNTHROID, LEVOTHROID) 100 MCG tablet Take 100 mcg by mouth daily.       . Linaclotide 290 MCG CAPS Take 290 mcg by mouth daily.      . memantine (NAMENDA) 10 MG tablet Take 10 mg by mouth 2 (two) times daily.       . Multiple Vitamin (MULTIVITAMIN) tablet Take 1 tablet by mouth daily.        . nitroGLYCERIN (NITROSTAT) 0.4 MG SL tablet Place 0.4 mg under the tongue every 5 (five) minutes as needed for chest pain.       . NON FORMULARY 2 Liters oxygen      . pravastatin (PRAVACHOL) 40 MG tablet Take 40 mg by mouth daily.       . pregabalin (LYRICA) 75 MG capsule Take 75 mg by mouth 2 (two) times daily.       Marland Kitchen telmisartan (MICARDIS)  40 MG tablet Take 20 mg by mouth daily.       Marland Kitchen torsemide (DEMADEX) 20 MG tablet Take 20 mg by mouth daily.      Marland Kitchen ULORIC 40 MG tablet Take 40 mg by mouth daily.       . vitamin B-12 (CYANOCOBALAMIN) 1000 MCG tablet Take 1,000 mcg by mouth 2 (two) times a week.       . warfarin (COUMADIN) 2.5 MG tablet Take 2.5 mg by mouth daily.       No current facility-administered medications for this visit.   Facility-Administered Medications Ordered in Other Visits  Medication Dose Route Frequency Provider Last Rate Last Dose  . sodium bicarbonate first hour bolus via infusion   Intravenous Once Pearson Grippe, MD         Past Medical History  Diagnosis Date  . Syncope   . Symptomatic bradycardia   . Acute on chronic renal failure   . Chronic lower back pain   . Nonischemic cardiomyopathy   . HTN (hypertension)   . HLD (hyperlipidemia)   . Atrial fibrillation     tachy-brady syndrome with <1% recurrent PAF since pacemaker placement  . CKD (chronic kidney disease)   . H/O: stroke   . Pulmonary embolism     HISTORY OF, the pt. had a recurrent bilateral  pulmonary emboli in 2005, on warfarin therapy and at which time she under went implantation of IVC filter  . Anemia   . Dementia   . Gastroesophageal reflux disease   . Aortic stenosis 10/13/2012    Low EF, low gradient with severe aortic stenosis confirmed by dobutamine stress echocardiogram s/p TAVR 12/2012  . PONV (postoperative nausea and vomiting)   . Osteoarthritis   . Neuropathy   . Spinal stenosis   . Sleep apnea     uses oxygen at night and PRN  . Symptomatic bradycardia 2012    s/p Medtronic PPM  . Chronic combined systolic and diastolic CHF (congestive heart failure)   . Fibromyalgia     ROS:   All systems reviewed and negative except as noted in the HPI.   Past Surgical History  Procedure Laterality Date  . Pacemaker insertion      Medtronic  . Transcatheter aortic valve replacement, transfemoral  12/21/2012    a. 29mm Edwards Sapien XT transcatheter heart valve placed via open left transfemoral approach b. Intra-op TEE: well-seated bioprosthetic aortic valve with mean gradient 2 mmHg, trivial AI, mild MR, EF 30-35%  . Transcatheter aortic valve replacement, transfemoral N/A 12/21/2012    Procedure: TRANSCATHETER AORTIC VALVE REPLACEMENT, TRANSFEMORAL;  Surgeon: Tonny Bollman, MD;  [facility];  Service: Open Heart Surgery;  Laterality: N/A;  . Intraoperative transesophageal echocardiogram N/A 12/21/2012    Procedure: INTRAOPERATIVE TRANSESOPHAGEAL ECHOCARDIOGRAM;  Surgeon: Tonny Bollman, MD;  Location: [facility] OR;  Service: Open Heart Surgery;  Laterality: N/A;  . Central venous catheter insertion Left 12/21/2012    Procedure: INSERTION CENTRAL LINE ADULT;  Surgeon: Tonny Bollman, MD;  Location: [facility] OR;  Service: Open Heart Surgery;  Laterality: Left;     History reviewed. No pertinent family history.   History   Social History  . Marital Status: Divorced    Spouse Name: N/A    Number of Children: N/A  . Years of Education: N/A   Occupational History  . Not on  file.   Social History Main Topics  . Smoking status: Never Smoker   . Smokeless tobacco: Never Used  . Alcohol  Use: No  . Drug Use: No  . Sexual Activity: Not Currently   Other Topics Concern  . Not on file   Social History Narrative  . No narrative on file     BP 168/87  Pulse 86  Wt 158 lb (71.668 kg)  Physical Exam:  Well appearing 79 year old woman, NAD HEENT: Unremarkable Neck:  7 cm JVD, no thyromegally Back:  No CVA tenderness Lungs:  Clear with no wheezes, rales, or rhonchi. HEART:  Regular rate rhythm, no rubs, no clicks, her aortic stenosis murmur has resolved. Abd:  soft, positive bowel sounds, no organomegally, no rebound, no guarding Ext:  2 plus pulses, no edema, no cyanosis, no clubbing Skin:  No rashes no nodules Neuro:  CN II through XII intact, motor grossly intact   DEVICE  Normal device function.  See PaceArt for details.   Assess/Plan:

## 2013-03-15 NOTE — Patient Instructions (Signed)
Your physician wants you to follow-up in: 12 months with Dr Court Joy will receive a reminder letter in the mail two months in advance. If you don't receive a letter, please call our office to schedule the follow-up appointment.   Remote monitoring is used to monitor your Pacemaker or ICD from home. This monitoring reduces the number of office visits required to check your device to one time per year. It allows [place] to keep an eye on the functioning of your device to ensure it is working properly. You are scheduled for a device check from home on 06/16/13. You may send your transmission at any time that day. If you have a wireless device, the transmission will be sent automatically. After your physician reviews your transmission, you will receive a postcard with your next transmission date.

## 2013-03-15 NOTE — Assessment & Plan Note (Signed)
Her heart failure symptoms are now class IIA. She will continue her current medical therapy, and maintain a low-sodium diet.

## 2013-03-22 ENCOUNTER — Telehealth: Payer: Self-pay

## 2013-03-22 DIAGNOSIS — I359 Nonrheumatic aortic valve disorder, unspecified: Secondary | ICD-10-CM

## 2013-03-22 NOTE — Telephone Encounter (Signed)
I spoke with the pt's son about 03/15/13 lab results.  Notes Recorded by Tonny Bollman, MD on 03/20/2013 at 6:16 PM Would repeat in 6 -8 weeks to follow creatinine trend and avoid overdiuresis.  The pt will have a BMP repeated on 04/26/13.

## 2013-04-26 ENCOUNTER — Other Ambulatory Visit (INDEPENDENT_AMBULATORY_CARE_PROVIDER_SITE_OTHER): Payer: PRIVATE HEALTH INSURANCE

## 2013-04-26 DIAGNOSIS — I359 Nonrheumatic aortic valve disorder, unspecified: Secondary | ICD-10-CM

## 2013-04-26 LAB — BASIC METABOLIC PANEL
BUN: 57 mg/dL — AB (ref 6–23)
CHLORIDE: 98 meq/L (ref 96–112)
CO2: 29 mEq/L (ref 19–32)
CREATININE: 2.2 mg/dL — AB (ref 0.4–1.2)
Calcium: 9.6 mg/dL (ref 8.4–10.5)
GFR: 23.5 mL/min — ABNORMAL LOW (ref >60.00)
GLUCOSE: 99 mg/dL (ref 70–99)
Potassium: 4.3 mEq/L (ref 3.5–5.1)
Sodium: 134 mEq/L — ABNORMAL LOW (ref 135–145)

## 2013-04-27 ENCOUNTER — Telehealth: Payer: Self-pay

## 2013-04-27 DIAGNOSIS — I359 Nonrheumatic aortic valve disorder, unspecified: Secondary | ICD-10-CM

## 2013-04-27 NOTE — Telephone Encounter (Signed)
I spoke with the pt's son and made him aware of medication changes recommended by Dr Excell Seltzer.  The pt will have BMP repeated on 05/11/13. The pt is scheduled to see Dr Allena Katz next week and I have forwarded these results through [facility] per the son's request.   Notes Recorded by Tonny Bollman, MD on 04/27/2013 at 12:00 AM Creatinine continues to trend upward. Would hold Micardis. Also hold demadex x 3 days, then start back at 10 mg daily. Repeat BMET in 2 weeks. thx

## 2013-05-10 ENCOUNTER — Other Ambulatory Visit (INDEPENDENT_AMBULATORY_CARE_PROVIDER_SITE_OTHER): Payer: PRIVATE HEALTH INSURANCE

## 2013-05-10 DIAGNOSIS — I359 Nonrheumatic aortic valve disorder, unspecified: Secondary | ICD-10-CM

## 2013-05-10 LAB — BASIC METABOLIC PANEL
BUN: 37 mg/dL — ABNORMAL HIGH (ref 6–23)
CALCIUM: 9.5 mg/dL (ref 8.4–10.5)
CO2: 29 meq/L (ref 19–32)
CREATININE: 1.9 mg/dL — AB (ref 0.4–1.2)
Chloride: 101 mEq/L (ref 96–112)
GFR: 27.61 mL/min — AB (ref >60.00)
GLUCOSE: 92 mg/dL (ref 70–99)
Potassium: 3.9 mEq/L (ref 3.5–5.1)
SODIUM: 137 meq/L (ref 135–145)

## 2013-05-11 ENCOUNTER — Other Ambulatory Visit: Payer: PRIVATE HEALTH INSURANCE

## 2013-05-24 ENCOUNTER — Other Ambulatory Visit: Payer: Self-pay | Admitting: Internal Medicine

## 2013-05-24 ENCOUNTER — Ambulatory Visit
Admission: RE | Admit: 2013-05-24 | Discharge: 2013-05-24 | Disposition: A | Discharge status: Home / Self Care | Payer: PRIVATE HEALTH INSURANCE | Source: Ambulatory Visit | Attending: Internal Medicine | Admitting: Internal Medicine

## 2013-05-24 DIAGNOSIS — W19XXXA Unspecified fall, initial encounter: Secondary | ICD-10-CM

## 2013-05-25 ENCOUNTER — Other Ambulatory Visit: Payer: Self-pay | Admitting: Internal Medicine

## 2013-06-02 ENCOUNTER — Encounter: Payer: Self-pay | Admitting: Internal Medicine

## 2013-06-13 ENCOUNTER — Encounter: Payer: Self-pay | Admitting: Gastroenterology

## 2013-06-16 ENCOUNTER — Ambulatory Visit (INDEPENDENT_AMBULATORY_CARE_PROVIDER_SITE_OTHER): Payer: PRIVATE HEALTH INSURANCE | Admitting: *Deleted

## 2013-06-16 DIAGNOSIS — I4891 Unspecified atrial fibrillation: Secondary | ICD-10-CM

## 2013-06-16 DIAGNOSIS — I480 Paroxysmal atrial fibrillation: Secondary | ICD-10-CM

## 2013-06-20 LAB — MDC_IDC_ENUM_SESS_TYPE_REMOTE
Brady Statistic AP VS Percent: 81 %
Brady Statistic AS VS Percent: 14 %
Date Time Interrogation Session: 20150227215829
Lead Channel Impedance Value: 576 Ohm
Lead Channel Pacing Threshold Amplitude: 0.5 V
Lead Channel Pacing Threshold Pulse Width: 0.4 ms
Lead Channel Pacing Threshold Pulse Width: 0.4 ms
MDC IDC MSMT BATTERY IMPEDANCE: 249 Ohm
MDC IDC MSMT BATTERY REMAINING LONGEVITY: 107 mo
MDC IDC MSMT BATTERY VOLTAGE: 2.78 V
MDC IDC MSMT LEADCHNL RA IMPEDANCE VALUE: 560 Ohm
MDC IDC MSMT LEADCHNL RA PACING THRESHOLD AMPLITUDE: 0.5 V
MDC IDC MSMT LEADCHNL RV SENSING INTR AMPL: 5.6 mV
MDC IDC SET LEADCHNL RA PACING AMPLITUDE: 2 V
MDC IDC SET LEADCHNL RV PACING AMPLITUDE: 2.5 V
MDC IDC SET LEADCHNL RV PACING PULSEWIDTH: 0.4 ms
MDC IDC SET LEADCHNL RV SENSING SENSITIVITY: 2 mV
MDC IDC STAT BRADY AP VP PERCENT: 4 %
MDC IDC STAT BRADY AS VP PERCENT: 0 %

## 2013-07-04 ENCOUNTER — Ambulatory Visit (INDEPENDENT_AMBULATORY_CARE_PROVIDER_SITE_OTHER): Payer: PRIVATE HEALTH INSURANCE | Admitting: Gastroenterology

## 2013-07-04 ENCOUNTER — Encounter: Payer: Self-pay | Admitting: Gastroenterology

## 2013-07-04 VITALS — BP 148/86 | HR 80 | Ht <= 58 in | Wt 155.0 lb

## 2013-07-04 DIAGNOSIS — M25559 Pain in unspecified hip: Secondary | ICD-10-CM

## 2013-07-04 DIAGNOSIS — R109 Unspecified abdominal pain: Secondary | ICD-10-CM

## 2013-07-04 DIAGNOSIS — G8929 Other chronic pain: Secondary | ICD-10-CM

## 2013-07-04 DIAGNOSIS — K5730 Diverticulosis of large intestine without perforation or abscess without bleeding: Secondary | ICD-10-CM

## 2013-07-04 DIAGNOSIS — M25551 Pain in right hip: Secondary | ICD-10-CM

## 2013-07-04 NOTE — Patient Instructions (Signed)
Follow up with your Primary Care Physician for musculoskeletal pain.  Thank you for choosing me and [facility].  Venita Lick. Pleas Koch., MD., Clementeen Graham  cc: Pearson Grippe, MD

## 2013-07-04 NOTE — Progress Notes (Signed)
History of Present Illness: This is a 80 year old female with multiple comorbidities as outlined below. She is ow.accompanied by her son. She relates a 2-3 year history of recurrent right lower quadrant, right hip and right lower flank pain. Her symptoms are clearly exacerbated by movement of her hip and bending. Her symptoms are not exacerbated by gastrointestinal function. She underwent a CT scan of the abdomen/pelvis in July 2014 that revealed colonic diverticulosis, small umbilical hernia containing omental fat, parenchymal atrophy both kidneys, renal cysts, IVC filter containing fat but no other GI pathology. She previously underwent colonoscopy by Dr. Sabino Gasser in November 2009 showing only a small inflammatory polyp. She states her symptoms improved recently after several visits to a chiropractor. Denies weight loss, abdominal pain, constipation, diarrhea, change in stool caliber, melena, hematochezia, nausea, vomiting, dysphagia, reflux symptoms, chest pain.   Allergies  Allergen Reactions  . Codeine Hives  . Darvocet [Propoxyphene N-Acetaminophen] Hives  . Nubain [Nalbuphine Hcl] Hives   Outpatient Prescriptions Prior to Visit  Medication Sig Dispense Refill  . alendronate (FOSAMAX) 70 MG tablet Take 70 mg by mouth once a week. Take with a full glass of water on an empty stomach.       Marland Kitchen amiodarone (PACERONE) 200 MG tablet Take 0.5 tablets (100 mg total) by mouth daily.      Marland Kitchen aspirin EC 81 MG EC tablet Take 1 tablet (81 mg total) by mouth daily.      Marland Kitchen BIOTIN 5000 PO Take 5,000 mcg by mouth daily.       . calcitRIOL (ROCALTROL) 0.25 MCG capsule Take 0.25 mcg by mouth daily.       . carvedilol (COREG) 25 MG tablet Take 0.5 tablets (12.5 mg total) by mouth 2 (two) times daily with a meal.  30 tablet  2  . cetirizine (ZYRTEC) 10 MG tablet Take 10 mg by mouth daily.       . Cholecalciferol (VITAMIN D) 1000 UNITS capsule Take 2,000 Units by mouth daily.       . colchicine 0.6 MG tablet  Take 0.6 mg by mouth every other day.      . docusate sodium (COLACE) 50 MG capsule Take by mouth daily.       Marland Kitchen donepezil (ARICEPT) 10 MG tablet Take 10 mg by mouth at bedtime.       . DULoxetine (CYMBALTA) 60 MG capsule Take 60 mg by mouth daily.      Marland Kitchen esomeprazole (NEXIUM) 40 MG capsule Take 40 mg by mouth daily before breakfast.        . hydrALAZINE (APRESOLINE) 10 MG tablet Take 10 mg by mouth 3 (three) times daily.       Marland Kitchen HYDROcodone-acetaminophen (NORCO) 10-325 MG per tablet Take 1 tablet by mouth every 6 (six) hours as needed for pain.       Marland Kitchen levothyroxine (SYNTHROID, LEVOTHROID) 100 MCG tablet Take 100 mcg by mouth daily.       . Linaclotide 290 MCG CAPS Take 290 mcg by mouth daily.      . memantine (NAMENDA) 10 MG tablet Take 10 mg by mouth 2 (two) times daily.       . Multiple Vitamin (MULTIVITAMIN) tablet Take 1 tablet by mouth daily.        . nitroGLYCERIN (NITROSTAT) 0.4 MG SL tablet Place 0.4 mg under the tongue every 5 (five) minutes as needed for chest pain.       . NON FORMULARY 2 Liters oxygen      .  pravastatin (PRAVACHOL) 40 MG tablet Take 40 mg by mouth daily.       . pregabalin (LYRICA) 75 MG capsule Take 75 mg by mouth 2 (two) times daily.       Marland Kitchen. torsemide (DEMADEX) 20 MG tablet On hold for 3 days starting 04/27/13.  Resume on 04/30/13 at one-half tablet by mouth daily      . ULORIC 40 MG tablet Take 40 mg by mouth daily.       . vitamin B-12 (CYANOCOBALAMIN) 1000 MCG tablet Take 1,000 mcg by mouth 2 (two) times a week.       . warfarin (COUMADIN) 2.5 MG tablet Take 2.5 mg by mouth daily.      Marland Kitchen. telmisartan (MICARDIS) 40 MG tablet Take 20 mg by mouth daily. ON HOLD STARTING 04/27/13       Facility-Administered Medications Prior to Visit  Medication Dose Route Frequency Provider Last Rate Last Dose  . sodium bicarbonate first hour bolus via infusion   Intravenous Once Pearson GrippeJames Kim, MD       Past Medical History  Diagnosis Date  . Syncope   . Symptomatic bradycardia   .  Acute on chronic renal failure   . Chronic lower back pain   . Nonischemic cardiomyopathy   . HTN (hypertension)   . HLD (hyperlipidemia)   . Atrial fibrillation     tachy-brady syndrome with <1% recurrent PAF since pacemaker placement  . CKD (chronic kidney disease)   . H/O: stroke   . Pulmonary embolism     HISTORY OF, the pt. had a recurrent bilateral pulmonary emboli in 2005, on warfarin therapy and at which time she under went implantation of IVC filter  . Anemia   . Dementia   . Gastroesophageal reflux disease   . Aortic stenosis 10/13/2012    Low EF, low gradient with severe aortic stenosis confirmed by dobutamine stress echocardiogram s/p TAVR 12/2012  . PONV (postoperative nausea and vomiting)   . Osteoarthritis   . Neuropathy   . Spinal stenosis   . Sleep apnea     uses oxygen at night and PRN  . Symptomatic bradycardia 2012    s/p Medtronic PPM  . Chronic combined systolic and diastolic CHF (congestive heart failure)   . Fibromyalgia    Past Surgical History  Procedure Laterality Date  . Pacemaker insertion      Medtronic  . Transcatheter aortic valve replacement, transfemoral  12/21/2012    a. 29mm Edwards Sapien XT transcatheter heart valve placed via open left transfemoral approach b. Intra-op TEE: well-seated bioprosthetic aortic valve with mean gradient 2 mmHg, trivial AI, mild MR, EF 30-35%  . Transcatheter aortic valve replacement, transfemoral N/A 12/21/2012    Procedure: TRANSCATHETER AORTIC VALVE REPLACEMENT, TRANSFEMORAL;  Surgeon: Tonny BollmanMichael Cooper, MD;  [facility];  Service: Open Heart Surgery;  Laterality: N/A;  . Intraoperative transesophageal echocardiogram N/A 12/21/2012    Procedure: INTRAOPERATIVE TRANSESOPHAGEAL ECHOCARDIOGRAM;  Surgeon: Tonny BollmanMichael Cooper, MD;  [facility];  Service: Open Heart Surgery;  Laterality: N/A;  . Central venous catheter insertion Left 12/21/2012    Procedure: INSERTION CENTRAL LINE ADULT;  Surgeon: Tonny BollmanMichael Cooper, MD;   [facility];  Service: Open Heart Surgery;  Laterality: Left;  . Cataract extraction    . Bunionectomy    . Appendectomy    . Abdominal hysterectomy    . Thyroidectomy, partial    . Back surgery    . Knee surgery Left   . Nose surgery  X 2   History   Social History  . Marital Status: Divorced    Spouse Name: N/A    Number of Children: N/A  . Years of Education: N/A   Social History Main Topics  . Smoking status: Never Smoker   . Smokeless tobacco: Never Used  . Alcohol Use: No  . Drug Use: No  . Sexual Activity: Not Currently   Other Topics Concern  . None   Social History Narrative  . None   Family History  Problem Relation Age of Onset  . Ovarian cancer Mother     Deceased  . Epilepsy Father     Deceased    Review of Systems: Pertinent positive and negative review of systems were noted in the above HPI section. All other review of systems were otherwise negative.   Physical Exam: General: Well developed , well nourished, elderly, chronically ill-appearing, no acute distress Head: Normocephalic and atraumatic Eyes:  sclerae anicteric, EOMI Ears: Normal auditory acuity Mouth: No deformity or lesions Neck: Supple, no masses or thyromegaly Lungs: Clear throughout to auscultation Heart: Regular rate and rhythm; systolic murmur, rubs or bruits Abdomen: Soft, non tender and non distended. No masses, hepatosplenomegaly or hernias noted. Normal Bowel sounds Musculoskeletal: Tnderness over the right hip, right upper thigh and lower right flank. Right hip pain elicited with flexion of right hip. Symmetrical with no gross deformities  Skin: No lesions on visible extremities Pulses:  Normal pulses noted Extremities: No clubbing, cyanosis, edema or deformities noted Neurological: Alert oriented x 4, grossly nonfocal Cervical Nodes:  No significant cervical adenopathy Inguinal Nodes: No significant inguinal adenopathy Psychological:  Alert and cooperative.  Normal mood and affect  Assessment and Recommendations:  1. Chronic right lower quadrant, right hip and right lower flank pain which has typical features for a musculoskeletal etiology. I am concerned she has a hip or lower back disorder causing her symptoms. There is no indication to repeat abdominal/pelvic imaging studies or to proceed with endoscopy or colonoscopy. She is at much higher risk for sedation given her substantial comorbidities. Ongoing followup with her PCP. GI followup as needed.  2. Diverticulosis. Long-term high fiber diet with adequate daily water intake.

## 2013-07-07 ENCOUNTER — Encounter: Payer: Self-pay | Admitting: *Deleted

## 2013-07-11 ENCOUNTER — Encounter: Payer: Self-pay | Admitting: *Deleted

## 2013-07-19 ENCOUNTER — Encounter: Payer: Self-pay | Admitting: Internal Medicine

## 2013-08-02 ENCOUNTER — Other Ambulatory Visit: Payer: Self-pay | Admitting: Nephrology

## 2013-08-02 DIAGNOSIS — N179 Acute kidney failure, unspecified: Secondary | ICD-10-CM

## 2013-08-04 ENCOUNTER — Ambulatory Visit
Admission: RE | Admit: 2013-08-04 | Discharge: 2013-08-04 | Disposition: A | Discharge status: Home / Self Care | Payer: PRIVATE HEALTH INSURANCE | Source: Ambulatory Visit | Attending: Nephrology | Admitting: Nephrology

## 2013-08-04 DIAGNOSIS — N179 Acute kidney failure, unspecified: Secondary | ICD-10-CM

## 2013-08-08 ENCOUNTER — Ambulatory Visit: Payer: PRIVATE HEALTH INSURANCE | Admitting: Gastroenterology

## 2013-09-20 ENCOUNTER — Ambulatory Visit (INDEPENDENT_AMBULATORY_CARE_PROVIDER_SITE_OTHER): Payer: PRIVATE HEALTH INSURANCE | Admitting: *Deleted

## 2013-09-20 ENCOUNTER — Telehealth: Payer: Self-pay | Admitting: Cardiology

## 2013-09-20 DIAGNOSIS — I480 Paroxysmal atrial fibrillation: Secondary | ICD-10-CM

## 2013-09-20 DIAGNOSIS — I4891 Unspecified atrial fibrillation: Secondary | ICD-10-CM

## 2013-09-20 NOTE — Telephone Encounter (Signed)
Spoke with pt son and reminded pt sopn of remote transmission that is due today. Pt son verbalized understanding.

## 2013-09-20 NOTE — Progress Notes (Signed)
Remote pacemaker transmission.   

## 2013-09-22 LAB — MDC_IDC_ENUM_SESS_TYPE_REMOTE
Battery Impedance: 273 Ohm
Brady Statistic AP VP Percent: 10 %
Brady Statistic AS VS Percent: 8 %
Date Time Interrogation Session: 20150602163110
Lead Channel Impedance Value: 543 Ohm
Lead Channel Impedance Value: 575 Ohm
Lead Channel Pacing Threshold Pulse Width: 0.4 ms
Lead Channel Setting Pacing Amplitude: 2.5 V
Lead Channel Setting Sensing Sensitivity: 2 mV
MDC IDC MSMT BATTERY REMAINING LONGEVITY: 102 mo
MDC IDC MSMT BATTERY VOLTAGE: 2.78 V
MDC IDC MSMT LEADCHNL RA PACING THRESHOLD AMPLITUDE: 0.5 V
MDC IDC MSMT LEADCHNL RV PACING THRESHOLD AMPLITUDE: 0.5 V
MDC IDC MSMT LEADCHNL RV PACING THRESHOLD PULSEWIDTH: 0.4 ms
MDC IDC MSMT LEADCHNL RV SENSING INTR AMPL: 5.6 mV
MDC IDC SET LEADCHNL RA PACING AMPLITUDE: 2 V
MDC IDC SET LEADCHNL RV PACING PULSEWIDTH: 0.4 ms
MDC IDC STAT BRADY AP VS PERCENT: 81 %
MDC IDC STAT BRADY AS VP PERCENT: 0 %

## 2013-09-30 ENCOUNTER — Encounter: Payer: Self-pay | Admitting: Cardiology

## 2013-10-05 ENCOUNTER — Encounter: Payer: Self-pay | Admitting: Internal Medicine

## 2013-12-09 ENCOUNTER — Encounter: Payer: Self-pay | Admitting: Internal Medicine

## 2013-12-20 ENCOUNTER — Other Ambulatory Visit: Payer: Self-pay

## 2013-12-20 DIAGNOSIS — I359 Nonrheumatic aortic valve disorder, unspecified: Secondary | ICD-10-CM

## 2013-12-20 DIAGNOSIS — Z952 Presence of prosthetic heart valve: Secondary | ICD-10-CM

## 2013-12-22 ENCOUNTER — Encounter: Payer: PRIVATE HEALTH INSURANCE | Admitting: *Deleted

## 2013-12-22 ENCOUNTER — Telehealth: Payer: Self-pay | Admitting: Cardiology

## 2013-12-22 NOTE — Telephone Encounter (Signed)
Confirmed remote transmission with pt son.  

## 2013-12-23 ENCOUNTER — Encounter: Payer: Self-pay | Admitting: Cardiology

## 2013-12-29 ENCOUNTER — Ambulatory Visit (INDEPENDENT_AMBULATORY_CARE_PROVIDER_SITE_OTHER): Payer: PRIVATE HEALTH INSURANCE | Admitting: *Deleted

## 2013-12-29 DIAGNOSIS — I498 Other specified cardiac arrhythmias: Secondary | ICD-10-CM

## 2013-12-29 DIAGNOSIS — I4891 Unspecified atrial fibrillation: Secondary | ICD-10-CM

## 2013-12-29 DIAGNOSIS — I480 Paroxysmal atrial fibrillation: Secondary | ICD-10-CM

## 2013-12-29 NOTE — Progress Notes (Signed)
Remote pacemaker transmission.   

## 2013-12-30 LAB — MDC_IDC_ENUM_SESS_TYPE_REMOTE
Battery Remaining Longevity: 102 mo
Brady Statistic AP VP Percent: 9 %
Lead Channel Impedance Value: 535 Ohm
Lead Channel Pacing Threshold Amplitude: 0.625 V
Lead Channel Pacing Threshold Pulse Width: 0.4 ms
Lead Channel Sensing Intrinsic Amplitude: 5.6 mV
Lead Channel Setting Pacing Amplitude: 2 V
Lead Channel Setting Sensing Sensitivity: 2 mV
MDC IDC MSMT BATTERY IMPEDANCE: 273 Ohm
MDC IDC MSMT BATTERY VOLTAGE: 2.78 V
MDC IDC MSMT LEADCHNL RA PACING THRESHOLD AMPLITUDE: 0.5 V
MDC IDC MSMT LEADCHNL RA PACING THRESHOLD PULSEWIDTH: 0.4 ms
MDC IDC MSMT LEADCHNL RV IMPEDANCE VALUE: 531 Ohm
MDC IDC SESS DTM: 20150910181517
MDC IDC SET LEADCHNL RV PACING AMPLITUDE: 2.5 V
MDC IDC SET LEADCHNL RV PACING PULSEWIDTH: 0.4 ms
MDC IDC STAT BRADY AP VS PERCENT: 82 %
MDC IDC STAT BRADY AS VP PERCENT: 0 %
MDC IDC STAT BRADY AS VS PERCENT: 8 %

## 2014-01-10 ENCOUNTER — Encounter: Payer: Self-pay | Admitting: Cardiology

## 2014-01-20 ENCOUNTER — Encounter: Payer: Self-pay | Admitting: Internal Medicine

## 2014-02-09 ENCOUNTER — Encounter: Payer: Self-pay | Admitting: Cardiovascular Disease

## 2014-02-09 ENCOUNTER — Other Ambulatory Visit: Payer: Self-pay

## 2014-02-09 ENCOUNTER — Ambulatory Visit (INDEPENDENT_AMBULATORY_CARE_PROVIDER_SITE_OTHER): Payer: PRIVATE HEALTH INSURANCE | Admitting: Cardiovascular Disease

## 2014-02-09 ENCOUNTER — Ambulatory Visit (HOSPITAL_COMMUNITY): Payer: PRIVATE HEALTH INSURANCE | Attending: Cardiology | Admitting: Radiology

## 2014-02-09 VITALS — BP 152/72 | HR 63 | Ht <= 58 in | Wt 150.0 lb

## 2014-02-09 DIAGNOSIS — Z952 Presence of prosthetic heart valve: Secondary | ICD-10-CM

## 2014-02-09 DIAGNOSIS — I350 Nonrheumatic aortic (valve) stenosis: Secondary | ICD-10-CM

## 2014-02-09 DIAGNOSIS — I10 Essential (primary) hypertension: Secondary | ICD-10-CM | POA: Diagnosis not present

## 2014-02-09 DIAGNOSIS — I359 Nonrheumatic aortic valve disorder, unspecified: Secondary | ICD-10-CM | POA: Diagnosis present

## 2014-02-09 DIAGNOSIS — Z954 Presence of other heart-valve replacement: Secondary | ICD-10-CM

## 2014-02-09 DIAGNOSIS — E785 Hyperlipidemia, unspecified: Secondary | ICD-10-CM | POA: Diagnosis not present

## 2014-02-09 NOTE — Progress Notes (Signed)
Background: The patient is followed for cardiomyopathy, chronic systolic heart failure, and severe aortic stenosis. She underwent TAVR 12/21/2012 with a 29 mm Edwards Sapien XT valve. Other cardiac related problems include atrial fibrillation and hypertension. The patient has been chronically anticoagulated with warfarin. The patient has undergone pacemaker placement and she is followed by Dr. Ladona Ridgelaylor.  HPI:  80 year old woman presenting for one year TAVR followup. Her breathing has greatly improved over the past year. She does admit to shortness of breath with moderate level activity, but denies resting shortness of breath, PND, or chest pain. She has 2 pillow orthopnea chronically. She also has mild leg swelling. She is primarily limited by fairly severe osteoarthritis and spinal stenosis. The patient is with her son today. Her mild dementia is essentially unchanged. Overall she is getting along quite well.  Studies:  2-D echocardiogram 02/09/2014: Study Conclusions  - Left ventricle: Mild septal hypokinesis. The cavity size was mildly dilated. Systolic function was normal. The estimated ejection fraction was in the range of 50% to 55%. Doppler parameters are consistent with abnormal left ventricular relaxation (grade 1 diastolic dysfunction). - Aortic valve: AV prosthesis opens well Peak and mean gradients through the valve are 12 and 8 mm Hg respectively There is trivial valvular AI. - Mitral valve: There was mild regurgitation. - Left atrium: The atrium was moderately dilated.   Outpatient Encounter Prescriptions as of 02/09/2014  Medication Sig  . alendronate (FOSAMAX) 70 MG tablet Take 70 mg by mouth once a week. Take with a full glass of water on an empty stomach.   Marland Kitchen. amiodarone (PACERONE) 200 MG tablet Take 0.5 tablets (100 mg total) by mouth daily.  Marland Kitchen. aspirin EC 81 MG EC tablet Take 1 tablet (81 mg total) by mouth daily.  Marland Kitchen. BIOTIN 5000 PO Take 5,000 mcg by mouth daily.   .  calcitRIOL (ROCALTROL) 0.25 MCG capsule Take 0.25 mcg by mouth daily.   . carvedilol (COREG) 25 MG tablet Take 0.5 tablets (12.5 mg total) by mouth 2 (two) times daily with a meal.  . cetirizine (ZYRTEC) 10 MG tablet Take 10 mg by mouth daily.   . Cholecalciferol (VITAMIN D) 1000 UNITS capsule Take 2,000 Units by mouth daily.   . colchicine 0.6 MG tablet Take 0.6 mg by mouth every other day.  . docusate sodium (COLACE) 50 MG capsule Take by mouth daily.   Marland Kitchen. donepezil (ARICEPT) 10 MG tablet Take 10 mg by mouth at bedtime.   . DULoxetine (CYMBALTA) 60 MG capsule Take 60 mg by mouth daily.  Marland Kitchen. esomeprazole (NEXIUM) 40 MG capsule Take 40 mg by mouth daily before breakfast.    . hydrALAZINE (APRESOLINE) 10 MG tablet Take 10 mg by mouth 3 (three) times daily.   Marland Kitchen. HYDROcodone-acetaminophen (NORCO) 10-325 MG per tablet Take 1 tablet by mouth every 6 (six) hours as needed for pain.   Marland Kitchen. levothyroxine (SYNTHROID, LEVOTHROID) 100 MCG tablet Take 100 mcg by mouth daily.   . Linaclotide 290 MCG CAPS Take 290 mcg by mouth daily.  . memantine (NAMENDA) 10 MG tablet Take 10 mg by mouth 2 (two) times daily.   . Multiple Vitamin (MULTIVITAMIN) tablet Take 1 tablet by mouth daily.    . nitroGLYCERIN (NITROSTAT) 0.4 MG SL tablet Place 0.4 mg under the tongue every 5 (five) minutes as needed for chest pain.   . NON FORMULARY 2 Liters oxygen  . pravastatin (PRAVACHOL) 40 MG tablet Take 40 mg by mouth daily.   . pregabalin (LYRICA)  75 MG capsule Take 75 mg by mouth 2 (two) times daily.   Marland Kitchen torsemide (DEMADEX) 20 MG tablet On hold for 3 days starting 04/27/13.  Resume on 04/30/13 at one-half tablet by mouth daily  . ULORIC 40 MG tablet Take 40 mg by mouth daily.   . vitamin B-12 (CYANOCOBALAMIN) 1000 MCG tablet Take 1,000 mcg by mouth 2 (two) times a week.   . warfarin (COUMADIN) 2.5 MG tablet Take 2.5 mg by mouth daily.    Allergies  Allergen Reactions  . Codeine Hives  . Darvocet [Propoxyphene N-Acetaminophen]  Hives  . Nubain [Nalbuphine Hcl] Hives    Past Medical History  Diagnosis Date  . Syncope   . Symptomatic bradycardia   . Acute on chronic renal failure   . Chronic lower back pain   . Nonischemic cardiomyopathy   . HTN (hypertension)   . HLD (hyperlipidemia)   . Atrial fibrillation     tachy-brady syndrome with <1% recurrent PAF since pacemaker placement  . CKD (chronic kidney disease)   . H/O: stroke   . Pulmonary embolism     HISTORY OF, the pt. had a recurrent bilateral pulmonary emboli in 2005, on warfarin therapy and at which time she under went implantation of IVC filter  . Anemia   . Dementia   . Gastroesophageal reflux disease   . Aortic stenosis 10/13/2012    Low EF, low gradient with severe aortic stenosis confirmed by dobutamine stress echocardiogram s/p TAVR 12/2012  . PONV (postoperative nausea and vomiting)   . Osteoarthritis   . Neuropathy   . Spinal stenosis   . Sleep apnea     uses oxygen at night and PRN  . Symptomatic bradycardia 2012    s/p Medtronic PPM  . Chronic combined systolic and diastolic CHF (congestive heart failure)   . Fibromyalgia     family history includes Epilepsy in her father; Ovarian cancer in her mother.   ROS: Negative except as per HPI  BP 152/72  Pulse 63  Ht 4\' 10"  (1.473 m)  Wt 150 lb (68.04 kg)  BMI 31.36 kg/m2  PHYSICAL EXAM: Pt is alert and oriented, NAD HEENT: normal Neck: JVP - normal, carotids 2+= without bruits Lungs: CTA bilaterally CV: RRR with grade 2/6 early systolic ejection murmur at the left upper sternal border Abd: soft, NT, Positive BS, no hepatomegaly Ext: Trace pretibial edema bilaterally, distal pulses intact and equal Skin: warm/dry no rash  EKG:  Atrial paced rhythm with prolonged AV conduction, heart rate 63 beats per minute, nonspecific intraventricular conduction block, nonspecific T wave abnormality.  ASSESSMENT AND PLAN: 1. Aortic stenosis status post TAVR. I personally reviewed the  patient's echo images. Her transvalvular gradients are within normal limits and there is minimal aortic insufficiency. She's had dramatic improvement in LV function over the past year, initially with an LVEF of 25% and now with LVEF of 50-55%. The patient has [place] Heart Association functional class II symptoms. Considering her multiple medical problems and history of advanced heart failure, I think she is doing remarkably well. I will plan on seeing her back in one year with an echocardiogram. She also follows with Dr. Ladona Ridgel on a yearly basis.  2. Severe nonischemic cardiomyopathy. LVEF is improved after treatment of her aortic stenosis. The patient takes carvedilol and hydralazine.   3. Paroxysmal atrial fibrillation. Followed by Dr. Ladona Ridgel. Anticoagulated with warfarin and treated with a rhythm control strategy using amiodarone.  I will see her back in one  year unless problems arise.  Tonny Bollman 02/09/2014 2:07 PM

## 2014-02-09 NOTE — Patient Instructions (Signed)
Your physician wants you to follow-up in: 1 YEAR with Dr Excell Seltzer.  You will receive a reminder letter in the mail two months in advance. If you don't receive a letter, please call our office to schedule the follow-up appointment.  Your physician has requested that you have an echocardiogram in 1 YEAR. Echocardiography is a painless test that uses sound waves to create images of your heart. It provides your doctor with information about the size and shape of your heart and how well your heart's chambers and valves are working. This procedure takes approximately one hour. There are no restrictions for this procedure.  Your physician recommends that you continue on your current medications as directed. Please refer to the Current Medication list given to you today.  Your physician discussed the importance of taking an antibiotic prior to any dental, gastrointestinal, genitourinary procedures to prevent damage to the heart valves from infection. You were given a prescription for an antibiotic based on current SBE prophylaxis guidelines.

## 2014-02-09 NOTE — Progress Notes (Signed)
Echocardiogram performed.  

## 2014-03-30 ENCOUNTER — Encounter (HOSPITAL_COMMUNITY): Payer: Self-pay | Admitting: Cardiovascular Disease

## 2014-04-18 ENCOUNTER — Encounter: Payer: Self-pay | Admitting: *Deleted

## 2014-04-24 DIAGNOSIS — Z7901 Long term (current) use of anticoagulants: Secondary | ICD-10-CM | POA: Diagnosis not present

## 2014-04-24 DIAGNOSIS — I4891 Unspecified atrial fibrillation: Secondary | ICD-10-CM | POA: Diagnosis not present

## 2014-05-03 DIAGNOSIS — I509 Heart failure, unspecified: Secondary | ICD-10-CM | POA: Diagnosis not present

## 2014-05-19 ENCOUNTER — Encounter: Payer: Self-pay | Admitting: *Deleted

## 2014-05-22 DIAGNOSIS — Z7901 Long term (current) use of anticoagulants: Secondary | ICD-10-CM | POA: Diagnosis not present

## 2014-05-22 DIAGNOSIS — I10 Essential (primary) hypertension: Secondary | ICD-10-CM | POA: Diagnosis not present

## 2014-05-22 DIAGNOSIS — I4891 Unspecified atrial fibrillation: Secondary | ICD-10-CM | POA: Diagnosis not present

## 2014-05-22 DIAGNOSIS — K5900 Constipation, unspecified: Secondary | ICD-10-CM | POA: Diagnosis not present

## 2014-06-03 DIAGNOSIS — I509 Heart failure, unspecified: Secondary | ICD-10-CM | POA: Diagnosis not present

## 2014-06-06 DIAGNOSIS — R739 Hyperglycemia, unspecified: Secondary | ICD-10-CM | POA: Diagnosis not present

## 2014-06-06 DIAGNOSIS — I10 Essential (primary) hypertension: Secondary | ICD-10-CM | POA: Diagnosis not present

## 2014-06-06 DIAGNOSIS — E039 Hypothyroidism, unspecified: Secondary | ICD-10-CM | POA: Diagnosis not present

## 2014-06-08 DIAGNOSIS — E039 Hypothyroidism, unspecified: Secondary | ICD-10-CM | POA: Diagnosis not present

## 2014-06-08 DIAGNOSIS — I10 Essential (primary) hypertension: Secondary | ICD-10-CM | POA: Diagnosis not present

## 2014-06-08 DIAGNOSIS — N184 Chronic kidney disease, stage 4 (severe): Secondary | ICD-10-CM | POA: Diagnosis not present

## 2014-06-08 DIAGNOSIS — Z7901 Long term (current) use of anticoagulants: Secondary | ICD-10-CM | POA: Diagnosis not present

## 2014-06-09 ENCOUNTER — Encounter: Payer: Self-pay | Admitting: Cardiovascular Disease

## 2014-06-15 ENCOUNTER — Encounter: Payer: Self-pay | Admitting: *Deleted

## 2014-06-19 DIAGNOSIS — Z7901 Long term (current) use of anticoagulants: Secondary | ICD-10-CM | POA: Diagnosis not present

## 2014-06-19 DIAGNOSIS — I4891 Unspecified atrial fibrillation: Secondary | ICD-10-CM | POA: Diagnosis not present

## 2014-07-02 DIAGNOSIS — I509 Heart failure, unspecified: Secondary | ICD-10-CM | POA: Diagnosis not present

## 2014-07-18 DIAGNOSIS — Z7901 Long term (current) use of anticoagulants: Secondary | ICD-10-CM | POA: Diagnosis not present

## 2014-07-18 DIAGNOSIS — I4891 Unspecified atrial fibrillation: Secondary | ICD-10-CM | POA: Diagnosis not present

## 2014-07-18 DIAGNOSIS — Z7689 Persons encountering health services in other specified circumstances: Secondary | ICD-10-CM | POA: Diagnosis not present

## 2014-07-20 ENCOUNTER — Encounter: Payer: Self-pay | Admitting: *Deleted

## 2014-07-25 DIAGNOSIS — I129 Hypertensive chronic kidney disease with stage 1 through stage 4 chronic kidney disease, or unspecified chronic kidney disease: Secondary | ICD-10-CM | POA: Diagnosis not present

## 2014-07-25 DIAGNOSIS — N184 Chronic kidney disease, stage 4 (severe): Secondary | ICD-10-CM | POA: Diagnosis not present

## 2014-07-25 DIAGNOSIS — N2581 Secondary hyperparathyroidism of renal origin: Secondary | ICD-10-CM | POA: Diagnosis not present

## 2014-07-25 DIAGNOSIS — D631 Anemia in chronic kidney disease: Secondary | ICD-10-CM | POA: Diagnosis not present

## 2014-07-26 DIAGNOSIS — R5381 Other malaise: Secondary | ICD-10-CM | POA: Diagnosis not present

## 2014-07-26 DIAGNOSIS — Z4789 Encounter for other orthopedic aftercare: Secondary | ICD-10-CM | POA: Diagnosis not present

## 2014-07-26 DIAGNOSIS — G8929 Other chronic pain: Secondary | ICD-10-CM | POA: Diagnosis not present

## 2014-07-26 DIAGNOSIS — S86891D Other injury of other muscle(s) and tendon(s) at lower leg level, right leg, subsequent encounter: Secondary | ICD-10-CM | POA: Diagnosis not present

## 2014-07-26 DIAGNOSIS — R262 Difficulty in walking, not elsewhere classified: Secondary | ICD-10-CM | POA: Diagnosis not present

## 2014-08-02 DIAGNOSIS — I509 Heart failure, unspecified: Secondary | ICD-10-CM | POA: Diagnosis not present

## 2014-08-09 DIAGNOSIS — Z7901 Long term (current) use of anticoagulants: Secondary | ICD-10-CM | POA: Diagnosis not present

## 2014-08-15 DIAGNOSIS — Z7901 Long term (current) use of anticoagulants: Secondary | ICD-10-CM | POA: Diagnosis not present

## 2014-08-15 DIAGNOSIS — I4891 Unspecified atrial fibrillation: Secondary | ICD-10-CM | POA: Diagnosis not present

## 2014-08-16 ENCOUNTER — Encounter: Payer: Self-pay | Admitting: *Deleted

## 2014-08-16 DIAGNOSIS — M542 Cervicalgia: Secondary | ICD-10-CM | POA: Diagnosis not present

## 2014-08-16 DIAGNOSIS — G894 Chronic pain syndrome: Secondary | ICD-10-CM | POA: Diagnosis not present

## 2014-08-16 DIAGNOSIS — M5442 Lumbago with sciatica, left side: Secondary | ICD-10-CM | POA: Diagnosis not present

## 2014-08-16 DIAGNOSIS — M5441 Lumbago with sciatica, right side: Secondary | ICD-10-CM | POA: Diagnosis not present

## 2014-08-31 DIAGNOSIS — M25561 Pain in right knee: Secondary | ICD-10-CM | POA: Diagnosis not present

## 2014-08-31 DIAGNOSIS — M170 Bilateral primary osteoarthritis of knee: Secondary | ICD-10-CM | POA: Diagnosis not present

## 2014-08-31 DIAGNOSIS — M25562 Pain in left knee: Secondary | ICD-10-CM | POA: Diagnosis not present

## 2014-09-01 DIAGNOSIS — I509 Heart failure, unspecified: Secondary | ICD-10-CM | POA: Diagnosis not present

## 2014-09-19 DIAGNOSIS — Z7901 Long term (current) use of anticoagulants: Secondary | ICD-10-CM | POA: Diagnosis not present

## 2014-09-19 DIAGNOSIS — I4891 Unspecified atrial fibrillation: Secondary | ICD-10-CM | POA: Diagnosis not present

## 2014-10-02 DIAGNOSIS — I509 Heart failure, unspecified: Secondary | ICD-10-CM | POA: Diagnosis not present

## 2014-10-10 ENCOUNTER — Encounter: Payer: Self-pay | Admitting: Internal Medicine

## 2014-10-10 ENCOUNTER — Ambulatory Visit (INDEPENDENT_AMBULATORY_CARE_PROVIDER_SITE_OTHER): Payer: Medicare Other | Admitting: Internal Medicine

## 2014-10-10 VITALS — BP 120/68 | HR 65 | Ht <= 58 in | Wt 166.6 lb

## 2014-10-10 DIAGNOSIS — I480 Paroxysmal atrial fibrillation: Secondary | ICD-10-CM

## 2014-10-10 DIAGNOSIS — I10 Essential (primary) hypertension: Secondary | ICD-10-CM | POA: Diagnosis not present

## 2014-10-10 DIAGNOSIS — Z950 Presence of cardiac pacemaker: Secondary | ICD-10-CM | POA: Diagnosis not present

## 2014-10-10 DIAGNOSIS — I5032 Chronic diastolic (congestive) heart failure: Secondary | ICD-10-CM

## 2014-10-10 LAB — CUP PACEART INCLINIC DEVICE CHECK
Battery Impedance: 348 Ohm
Battery Remaining Longevity: 94 mo
Battery Voltage: 2.78 V
Brady Statistic AP VS Percent: 79 %
Lead Channel Impedance Value: 535 Ohm
Lead Channel Impedance Value: 571 Ohm
Lead Channel Pacing Threshold Pulse Width: 0.4 ms
Lead Channel Pacing Threshold Pulse Width: 0.4 ms
Lead Channel Setting Pacing Amplitude: 2 V
Lead Channel Setting Pacing Amplitude: 2.5 V
Lead Channel Setting Pacing Pulse Width: 0.4 ms
Lead Channel Setting Sensing Sensitivity: 1.4 mV
MDC IDC MSMT LEADCHNL RA PACING THRESHOLD AMPLITUDE: 0.5 V
MDC IDC MSMT LEADCHNL RA SENSING INTR AMPL: 1.4 mV
MDC IDC MSMT LEADCHNL RV PACING THRESHOLD AMPLITUDE: 0.5 V
MDC IDC MSMT LEADCHNL RV SENSING INTR AMPL: 2.8 mV
MDC IDC SESS DTM: 20160621165959
MDC IDC STAT BRADY AP VP PERCENT: 15 %
MDC IDC STAT BRADY AS VP PERCENT: 0 %
MDC IDC STAT BRADY AS VS PERCENT: 6 %

## 2014-10-10 NOTE — Assessment & Plan Note (Signed)
Her medtronic DDD PM is working normally. Will recheck in several months. 

## 2014-10-10 NOTE — Assessment & Plan Note (Signed)
Her symptoms are class 2. She is limited mostly by her arthritis. She will maintain a low sodium diet.

## 2014-10-10 NOTE — Patient Instructions (Signed)
Medication Instructions:  Your physician recommends that you continue on your current medications as directed. Please refer to the Current Medication list given to you today.   Labwork: None ordered  Testing/Procedures: None ordered  Follow-Up: Your physician wants you to follow-up in: 12 months with Dr Taylor You will receive a reminder letter in the mail two months in advance. If you don't receive a letter, please call our office to schedule the follow-up appointment.  Remote monitoring is used to monitor your Pacemaker of ICD from home. This monitoring reduces the number of office visits required to check your device to one time per year. It allows us to keep an eye on the functioning of your device to ensure it is working properly. You are scheduled for a device check from home on 01/09/15. You may send your transmission at any time that day. If you have a wireless device, the transmission will be sent automatically. After your physician reviews your transmission, you will receive a postcard with your next transmission date.    Any Other Special Instructions Will Be Listed Below (If Applicable).   

## 2014-10-10 NOTE — Assessment & Plan Note (Signed)
Her blood pressure is well controlled. No change in her meds. 

## 2014-10-10 NOTE — Progress Notes (Signed)
HPI Mrs. [name] returns today for followup. She is a very pleasant 81 year old woman, with symptomatic bradycardia, s/p PPM insertion. She denied anginal symptoms. She has only undergone percutaneous aortic valve replacement  With nice result. She is bothered by severe arthritis and is considering knee replacement. Her heart failure symptoms are much improved since she had TAVR. Allergies  Allergen Reactions  . Codeine Hives  . Darvocet [Propoxyphene N-Acetaminophen] Hives  . Nubain [Nalbuphine Hcl] Hives     Current Outpatient Prescriptions  Medication Sig Dispense Refill  . alendronate (FOSAMAX) 70 MG tablet Take 70 mg by mouth once a week. Take with a full glass of water on an empty stomach.     Marland Kitchen amiodarone (PACERONE) 200 MG tablet Take 0.5 tablets (100 mg total) by mouth daily.    Marland Kitchen aspirin EC 81 MG EC tablet Take 1 tablet (81 mg total) by mouth daily.    Marland Kitchen BIOTIN 5000 PO Take 5,000 mcg by mouth daily.     . calcitRIOL (ROCALTROL) 0.25 MCG capsule Take 0.25 mcg by mouth daily.     . carvedilol (COREG) 12.5 MG tablet Take 1 tablet by mouth 2 (two) times daily.  3  . cetirizine (ZYRTEC) 10 MG tablet Take 10 mg by mouth daily.     . Cholecalciferol (VITAMIN D) 1000 UNITS capsule Take 2,000 Units by mouth daily.     . colchicine 0.6 MG tablet Take 0.6 mg by mouth every other day.    . docusate sodium (COLACE) 50 MG capsule Take 50 mg by mouth 2 (two) times daily.     Marland Kitchen donepezil (ARICEPT) 10 MG tablet Take 10 mg by mouth at bedtime.     . DULoxetine (CYMBALTA) 60 MG capsule Take 60 mg by mouth daily.    Marland Kitchen esomeprazole (NEXIUM) 40 MG capsule Take 40 mg by mouth daily before breakfast.      . hydrALAZINE (APRESOLINE) 10 MG tablet Take 10 mg by mouth 3 (three) times daily.     Marland Kitchen HYDROcodone-acetaminophen (NORCO) 10-325 MG per tablet Take 1 tablet by mouth every 6 (six) hours as needed for pain.     Marland Kitchen levothyroxine (SYNTHROID, LEVOTHROID) 100 MCG tablet Take 100 mcg by mouth daily.     .  Linaclotide 290 MCG CAPS Take 290 mcg by mouth daily.    . memantine (NAMENDA) 10 MG tablet Take 10 mg by mouth 2 (two) times daily.     . Multiple Vitamin (MULTIVITAMIN) tablet Take 1 tablet by mouth daily.      . nitroGLYCERIN (NITROSTAT) 0.4 MG SL tablet Place 0.4 mg under the tongue every 5 (five) minutes as needed for chest pain (MAX 3 TABLETS).     . NON FORMULARY 2 Liters oxygen as needed for shortness of breath    . pravastatin (PRAVACHOL) 40 MG tablet Take 40 mg by mouth daily.     . pregabalin (LYRICA) 75 MG capsule Take 75 mg by mouth 2 (two) times daily.     Marland Kitchen torsemide (DEMADEX) 20 MG tablet Take 1.5 tablets by mouth daily    . Turmeric 500 MG CAPS Take 1 capsule by mouth 2 (two) times daily.    Marland Kitchen ULORIC 40 MG tablet Take 40 mg by mouth daily.     . vitamin B-12 (CYANOCOBALAMIN) 1000 MCG tablet Take 1,000 mcg by mouth 2 (two) times a week.     . warfarin (COUMADIN) 2.5 MG tablet Take 2.5 mg by mouth daily.     No current  facility-administered medications for this visit.   Facility-Administered Medications Ordered in Other Visits  Medication Dose Route Frequency Provider Last Rate Last Dose  . sodium bicarbonate first hour bolus via infusion   Intravenous Once Pearson Grippe, MD         Past Medical History  Diagnosis Date  . Syncope   . Symptomatic bradycardia   . Acute on chronic renal failure   . Chronic lower back pain   . Nonischemic cardiomyopathy   . HTN (hypertension)   . HLD (hyperlipidemia)   . Atrial fibrillation     tachy-brady syndrome with <1% recurrent PAF since pacemaker placement  . CKD (chronic kidney disease)   . H/O: stroke   . Pulmonary embolism     HISTORY OF, the pt. had a recurrent bilateral pulmonary emboli in 2005, on warfarin therapy and at which time she under went implantation of IVC filter  . Anemia   . Dementia   . Gastroesophageal reflux disease   . Aortic stenosis 10/13/2012    Low EF, low gradient with severe aortic stenosis confirmed by  dobutamine stress echocardiogram s/p TAVR 12/2012  . PONV (postoperative nausea and vomiting)   . Osteoarthritis   . Neuropathy   . Spinal stenosis   . Sleep apnea     uses oxygen at night and PRN  . Symptomatic bradycardia 2012    s/p Medtronic PPM  . Chronic combined systolic and diastolic CHF (congestive heart failure)   . Fibromyalgia     ROS:   All systems reviewed and negative except as noted in the HPI.   Past Surgical History  Procedure Laterality Date  . Pacemaker insertion      Medtronic  . Transcatheter aortic valve replacement, transfemoral  12/21/2012    a. 57mm Edwards Sapien XT transcatheter heart valve placed via open left transfemoral approach b. Intra-op TEE: well-seated bioprosthetic aortic valve with mean gradient 2 mmHg, trivial AI, mild MR, EF 30-35%  . Transcatheter aortic valve replacement, transfemoral N/A 12/21/2012    Procedure: TRANSCATHETER AORTIC VALVE REPLACEMENT, TRANSFEMORAL;  Surgeon: Tonny Bollman, MD;  Location: [facility] OR;  Service: Open Heart Surgery;  Laterality: N/A;  . Intraoperative transesophageal echocardiogram N/A 12/21/2012    Procedure: INTRAOPERATIVE TRANSESOPHAGEAL ECHOCARDIOGRAM;  Surgeon: Tonny Bollman, MD;  Location: [facility] OR;  Service: Open Heart Surgery;  Laterality: N/A;  . Central venous catheter insertion Left 12/21/2012    Procedure: INSERTION CENTRAL LINE ADULT;  Surgeon: Tonny Bollman, MD;  Location: [facility] OR;  Service: Open Heart Surgery;  Laterality: Left;  . Cataract extraction    . Bunionectomy    . Appendectomy    . Abdominal hysterectomy    . Thyroidectomy, partial    . Back surgery    . Knee surgery Left   . Nose surgery      X 2  . Left and right heart catheterization with coronary angiogram N/A 10/18/2012    Procedure: LEFT AND RIGHT HEART CATHETERIZATION WITH CORONARY ANGIOGRAM;  Surgeon: Tonny Bollman, MD;  Location: [facility] CATH LAB;  Service: Cardiovascular;  Laterality: N/A;     Family History  Problem Relation Age of  Onset  . Ovarian cancer Mother     Deceased  . Epilepsy Father     Deceased     History   Social History  . Marital Status: Divorced    Spouse Name: N/A  . Number of Children: N/A  . Years of Education: N/A   Occupational History  . Not on file.  Social History Main Topics  . Smoking status: Never Smoker   . Smokeless tobacco: Never Used  . Alcohol Use: No  . Drug Use: No  . Sexual Activity: Not Currently   Other Topics Concern  . Not on file   Social History Narrative     BP 120/68 mmHg  Pulse 65  Ht  (1.473 m)  Wt 166 lb 9.6 oz (75.569 kg)  BMI 34.83 kg/m2  Physical Exam:  stable appearing 81 year old woman, NAD HEENT: Unremarkable Neck:  7 cm JVD, no thyromegally Back:  No CVA tenderness Lungs:  Clear with no wheezes, rales, or rhonchi. HEART:  Regular rate rhythm, no rubs, no clicks, her aortic stenosis murmur has resolved. Abd:  soft, positive bowel sounds, no organomegally, no rebound, no guarding Ext:  2 plus pulses, no edema, no cyanosis, no clubbing Skin:  No rashes no nodules Neuro:  CN II through XII intact, motor grossly intact   DEVICE  Normal device function.  See PaceArt for details.   Assess/Plan:

## 2014-10-10 NOTE — Assessment & Plan Note (Signed)
She is maintaining NSR very nicely and will continue amiodarone.

## 2014-10-17 DIAGNOSIS — I4891 Unspecified atrial fibrillation: Secondary | ICD-10-CM | POA: Diagnosis not present

## 2014-10-17 DIAGNOSIS — Z7901 Long term (current) use of anticoagulants: Secondary | ICD-10-CM | POA: Diagnosis not present

## 2014-10-25 DIAGNOSIS — M170 Bilateral primary osteoarthritis of knee: Secondary | ICD-10-CM | POA: Diagnosis not present

## 2014-10-26 ENCOUNTER — Encounter: Payer: Self-pay | Admitting: Internal Medicine

## 2014-11-01 DIAGNOSIS — I509 Heart failure, unspecified: Secondary | ICD-10-CM | POA: Diagnosis not present

## 2014-11-08 ENCOUNTER — Telehealth: Payer: Self-pay | Admitting: Internal Medicine

## 2014-11-08 NOTE — Telephone Encounter (Signed)
New message      Did you get the clearance for knee replacement form from Dr Thomasena Edis at [facility]?  [facility] did not get it back---family calling to make sure we got it

## 2014-11-08 NOTE — Telephone Encounter (Signed)
Faxed back and son aware.  [facility] will do the Lovenox bridge.  They already have an appointment

## 2014-11-15 DIAGNOSIS — M25562 Pain in left knee: Secondary | ICD-10-CM | POA: Diagnosis not present

## 2014-11-15 DIAGNOSIS — M170 Bilateral primary osteoarthritis of knee: Secondary | ICD-10-CM | POA: Diagnosis not present

## 2014-11-16 DIAGNOSIS — Z7901 Long term (current) use of anticoagulants: Secondary | ICD-10-CM | POA: Diagnosis not present

## 2014-11-16 DIAGNOSIS — I4891 Unspecified atrial fibrillation: Secondary | ICD-10-CM | POA: Diagnosis not present

## 2014-11-20 NOTE — H&P (Signed)
TOTAL KNEE ADMISSION H&P  Patient is being admitted for right total knee arthroplasty.  Subjective:  Chief Complaint:      Right knee primary OA / pain.  HPI: [name], 81 y.o. female, has a history of pain and functional disability in the right knee due to arthritis and has failed non-surgical conservative treatments for greater than 12 weeks to includeNSAID's and/or analgesics, corticosteriod injections, viscosupplementation injections, use of assistive devices and activity modification.  Onset of symptoms was gradual, starting  years ago with gradually worsening course since that time. The patient noted no past surgery on the right knee(s).  Patient currently rates pain in the right knee(s) at 8 out of 10 with activity. Patient has night pain, worsening of pain with activity and weight bearing, pain that interferes with activities of daily living, pain with passive range of motion, crepitus and joint swelling.  Patient has evidence of periarticular osteophytes and joint space narrowing by imaging studies.  There is no active infection.  Risks, benefits and expectations were discussed with the patient.  Risks including but not limited to the risk of anesthesia, blood clots, nerve damage, blood vessel damage, failure of the prosthesis, infection and up to and including death.  Patient understand the risks, benefits and expectations and wishes to proceed with surgery.   PCP: Pearson Grippe, MD  D/C Plans:      Home with HHPT/SNF  Post-op Meds:       No Rx given  Tranexamic Acid:      To be given - topically (CAD, previous PE)  Decadron:      Is to be given  FYI:     Coumadin and Lovenox  Oxycodone post-op    Patient Active Problem List   Diagnosis Date Noted  . Acute blood loss anemia 12/28/2012  . Thrombocytopenia 12/28/2012  . Toe fracture 12/28/2012  . CKD (chronic kidney disease), stage III 12/28/2012  . Low back pain 12/28/2012  . Wound dehiscence, surgical 12/28/2012  .  Atelectasis 12/28/2012  . S/P TAVR (transcatheter aortic valve replacement) 12/21/2012  . Severe aortic stenosis 12/06/2012  . Aortic stenosis 10/13/2012  . Acute on chronic systolic and diastolic heart failure, NYHA class 3 10/13/2012  . Pacemaker 10/15/2010  . Paroxysmal atrial fibrillation 10/15/2010  . Dyslipidemia 10/15/2010  . Hypertension 10/15/2010  . Chronic diastolic heart failure 10/15/2010   Past Medical History  Diagnosis Date  . Syncope   . Symptomatic bradycardia   . Acute on chronic renal failure   . Chronic lower back pain   . Nonischemic cardiomyopathy   . HTN (hypertension)   . HLD (hyperlipidemia)   . Atrial fibrillation     tachy-brady syndrome with <1% recurrent PAF since pacemaker placement  . CKD (chronic kidney disease)   . H/O: stroke   . Pulmonary embolism     HISTORY OF, the pt. had a recurrent bilateral pulmonary emboli in 2005, on warfarin therapy and at which time she under went implantation of IVC filter  . Anemia   . Dementia   . Gastroesophageal reflux disease   . Aortic stenosis 10/13/2012    Low EF, low gradient with severe aortic stenosis confirmed by dobutamine stress echocardiogram s/p TAVR 12/2012  . PONV (postoperative nausea and vomiting)   . Osteoarthritis   . Neuropathy   . Spinal stenosis   . Sleep apnea     uses oxygen at night and PRN  . Symptomatic bradycardia 2012    s/p Medtronic PPM  .  Chronic combined systolic and diastolic CHF (congestive heart failure)   . Fibromyalgia     Past Surgical History  Procedure Laterality Date  . Pacemaker insertion      Medtronic  . Transcatheter aortic valve replacement, transfemoral  12/21/2012    a. 35mm Edwards Sapien XT transcatheter heart valve placed via open left transfemoral approach b. Intra-op TEE: well-seated bioprosthetic aortic valve with mean gradient 2 mmHg, trivial AI, mild MR, EF 30-35%  . Transcatheter aortic valve replacement, transfemoral N/A 12/21/2012    Procedure:  TRANSCATHETER AORTIC VALVE REPLACEMENT, TRANSFEMORAL;  Surgeon: Tonny Bollman, MD;  Location: [facility] OR;  Service: Open Heart Surgery;  Laterality: N/A;  . Intraoperative transesophageal echocardiogram N/A 12/21/2012    Procedure: INTRAOPERATIVE TRANSESOPHAGEAL ECHOCARDIOGRAM;  Surgeon: Tonny Bollman, MD;  Location: [facility] OR;  Service: Open Heart Surgery;  Laterality: N/A;  . Central venous catheter insertion Left 12/21/2012    Procedure: INSERTION CENTRAL LINE ADULT;  Surgeon: Tonny Bollman, MD;  Location: [facility] OR;  Service: Open Heart Surgery;  Laterality: Left;  . Cataract extraction    . Bunionectomy    . Appendectomy    . Abdominal hysterectomy    . Thyroidectomy, partial    . Back surgery    . Knee surgery Left   . Nose surgery      X 2  . Left and right heart catheterization with coronary angiogram N/A 10/18/2012    Procedure: LEFT AND RIGHT HEART CATHETERIZATION WITH CORONARY ANGIOGRAM;  Surgeon: Tonny Bollman, MD;  Location: [facility] CATH LAB;  Service: Cardiovascular;  Laterality: N/A;    No prescriptions prior to admission   Allergies  Allergen Reactions  . Codeine Hives  . Darvocet [Propoxyphene N-Acetaminophen] Hives  . Nubain [Nalbuphine Hcl] Hives    History  Substance Use Topics  . Smoking status: Never Smoker   . Smokeless tobacco: Never Used  . Alcohol Use: No    Family History  Problem Relation Age of Onset  . Ovarian cancer Mother     Deceased  . Epilepsy Father     Deceased     Review of Systems  HENT: Positive for hearing loss.   Eyes: Negative.   Respiratory: Positive for shortness of breath (on exertion).   Cardiovascular: Negative.   Gastrointestinal: Positive for heartburn and constipation.  Genitourinary: Positive for urgency and frequency.  Musculoskeletal: Positive for back pain and joint pain.  Skin: Negative.   Neurological: Positive for weakness.  Endo/Heme/Allergies: Negative.   Psychiatric/Behavioral: Positive for memory loss.     Objective:  Physical Exam  Constitutional: She is oriented to person, place, and time. She appears well-developed.  HENT:  Head: Normocephalic and atraumatic.  Mouth/Throat: She has dentures.  Eyes: Pupils are equal, round, and reactive to light.  Neck: Neck supple. No JVD present. No tracheal deviation present. No thyromegaly present.  Cardiovascular: Normal rate, regular rhythm, normal heart sounds and intact distal pulses.   Respiratory: Effort normal and breath sounds normal. No stridor. No respiratory distress. She has no wheezes.  GI: Soft. There is no tenderness. There is no guarding.  Musculoskeletal:       Right knee: She exhibits decreased range of motion, swelling and bony tenderness. She exhibits no ecchymosis, no deformity, no laceration and no erythema. Tenderness found.  Lymphadenopathy:    She has no cervical adenopathy.  Neurological: She is alert and oriented to person, place, and time. A sensory deficit (bilateral LE neuropathy) is present.  Skin: Skin is warm and dry.  Psychiatric:  She has a normal mood and affect.      Labs:  Estimated body mass index is 34.83 kg/(m^2) as calculated from the following:   Height as of 10/10/14:  (1.473 m).   Weight as of 10/10/14: 75.569 kg (166 lb 9.6 oz).   Imaging Review Plain radiographs demonstrate severe degenerative joint disease of the right knee(s). The overall alignment is neutral. The bone quality appears to be good for age and reported activity level.  Assessment/Plan:  End stage arthritis, right knee   The patient history, physical examination, clinical judgment of the provider and imaging studies are consistent with end stage degenerative joint disease of the right knee(s) and total knee arthroplasty is deemed medically necessary. The treatment options including medical management, injection therapy arthroscopy and arthroplasty were discussed at length. The risks and benefits of total knee arthroplasty  were presented and reviewed. The risks due to aseptic loosening, infection, stiffness, patella tracking problems, thromboembolic complications and other imponderables were discussed. The patient acknowledged the explanation, agreed to proceed with the plan and consent was signed. Patient is being admitted for inpatient treatment for surgery, pain control, PT, OT, prophylactic antibiotics, VTE prophylaxis, progressive ambulation and ADL's and discharge planning. The patient is planning to be discharged home with home health services/SNF.     Jodi Auerbach Elene Downum   PA-C  11/20/2014, 4:26 PM

## 2014-11-27 DIAGNOSIS — N184 Chronic kidney disease, stage 4 (severe): Secondary | ICD-10-CM | POA: Diagnosis not present

## 2014-11-27 DIAGNOSIS — N2581 Secondary hyperparathyroidism of renal origin: Secondary | ICD-10-CM | POA: Diagnosis not present

## 2014-11-27 DIAGNOSIS — N189 Chronic kidney disease, unspecified: Secondary | ICD-10-CM | POA: Diagnosis not present

## 2014-11-28 DIAGNOSIS — I129 Hypertensive chronic kidney disease with stage 1 through stage 4 chronic kidney disease, or unspecified chronic kidney disease: Secondary | ICD-10-CM | POA: Diagnosis not present

## 2014-11-28 DIAGNOSIS — N2581 Secondary hyperparathyroidism of renal origin: Secondary | ICD-10-CM | POA: Diagnosis not present

## 2014-11-28 DIAGNOSIS — D631 Anemia in chronic kidney disease: Secondary | ICD-10-CM | POA: Diagnosis not present

## 2014-11-28 DIAGNOSIS — N184 Chronic kidney disease, stage 4 (severe): Secondary | ICD-10-CM | POA: Diagnosis not present

## 2014-11-28 NOTE — Patient Instructions (Addendum)
MEE CHERNEY  11/28/2014   Your procedure is scheduled on:   12/04/2014    Report to [facility] Main  Entrance take [place]  elevators to 3rd floor to  Short Stay Center at   [phone] AM.  Call this number if you have problems the morning of surgery [phone]   Remember: ONLY 1 PERSON MAY GO WITH YOU TO SHORT STAY TO GET  READY MORNING OF YOUR SURGERY.  Do not eat food or drink liquids :After Midnight.     Take these medicines the morning of surgery with A SIP OF WATER:   Amiodarone ( pacerone), Coreg (Carvedilol), Zyrtec, Restasis eye drops if needed, Nexium, Hydralazine, Hydrocodone if needed, synthroid , Lyrica                                You may not have any metal on your body including hair pins and              piercings  Do not wear jewelry, make-up, lotions, powders or perfumes, deodorant             Do not wear nail polish.  Do not shave  48 hours prior to surgery.                 Do not bring valuables to the hospital. [place] IS NOT             RESPONSIBLE   FOR VALUABLES.  Contacts, dentures or bridgework may not be worn into surgery.  Leave suitcase in the car. After surgery it may be brought to your room.      Special Instructions: coughing and deep breathing exercises,leg exerciss              Please read over the following fact sheets you were given: _____________________________________________________________________             [facility] - Preparing for Surgery Before surgery, you can play an important role.  Because skin is not sterile, your skin needs to be as free of germs as possible.  You can reduce the number of germs on your skin by washing with CHG (chlorahexidine gluconate) soap before surgery.  CHG is an antiseptic cleaner which kills germs and bonds with the skin to continue killing germs even after washing. Please DO NOT use if you have an allergy to CHG or antibacterial soaps.  If your skin becomes reddened/irritated  stop using the CHG and inform your nurse when you arrive at Short Stay. Do not shave (including legs and underarms) for at least 48 hours prior to the first CHG shower.  You may shave your face/neck. Please follow these instructions carefully:  1.  Shower with CHG Soap the night before surgery and the  morning of Surgery.  2.  If you choose to wash your hair, wash your hair first as usual with your  normal  shampoo.  3.  After you shampoo, rinse your hair and body thoroughly to remove the  shampoo.                           4.  Use CHG as you would any other liquid soap.  You can apply chg directly  to the skin and wash  Gently with a scrungie or clean washcloth.  5.  Apply the CHG Soap to your body ONLY FROM THE NECK DOWN.   Do not use on face/ open                           Wound or open sores. Avoid contact with eyes, ears mouth and genitals (private parts).                       Wash face,  Genitals (private parts) with your normal soap.             6.  Wash thoroughly, paying special attention to the area where your surgery  will be performed.  7.  Thoroughly rinse your body with warm water from the neck down.  8.  DO NOT shower/wash with your normal soap after using and rinsing off  the CHG Soap.                9.  Pat yourself dry with a clean towel.            10.  Wear clean pajamas.            11.  Place clean sheets on your bed the night of your first shower and do not  sleep with pets. Day of Surgery : Do not apply any lotions/deodorants the morning of surgery.  Please wear clean clothes to the hospital/surgery center.  FAILURE TO FOLLOW THESE INSTRUCTIONS MAY RESULT IN THE CANCELLATION OF YOUR SURGERY PATIENT SIGNATURE_________________________________  NURSE SIGNATURE__________________________________  ________________________________________________________________________  WHAT IS A BLOOD TRANSFUSION? Blood Transfusion Information  A transfusion is the  replacement of blood or some of its parts. Blood is made up of multiple cells which provide different functions.  Red blood cells carry oxygen and are used for blood loss replacement.  White blood cells fight against infection.  Platelets control bleeding.  Plasma helps clot blood.  Other blood products are available for specialized needs, such as hemophilia or other clotting disorders. BEFORE THE TRANSFUSION  Who gives blood for transfusions?   Healthy volunteers who are fully evaluated to make sure their blood is safe. This is blood bank blood. Transfusion therapy is the safest it has ever been in the practice of medicine. Before blood is taken from a donor, a complete history is taken to make sure that person has no history of diseases nor engages in risky social behavior (examples are intravenous drug use or sexual activity with multiple partners). The donor's travel history is screened to minimize risk of transmitting infections, such as malaria. The donated blood is tested for signs of infectious diseases, such as HIV and hepatitis. The blood is then tested to be sure it is compatible with you in order to minimize the chance of a transfusion reaction. If you or a relative donates blood, this is often done in anticipation of surgery and is not appropriate for emergency situations. It takes many days to process the donated blood. RISKS AND COMPLICATIONS Although transfusion therapy is very safe and saves many lives, the main dangers of transfusion include:  1. Getting an infectious disease. 2. Developing a transfusion reaction. This is an allergic reaction to something in the blood you were given. Every precaution is taken to prevent this. The decision to have a blood transfusion has been considered carefully by your caregiver before blood is given. Blood is not given unless the benefits outweigh  the risks. AFTER THE TRANSFUSION  Right after receiving a blood transfusion, you will usually  feel much better and more energetic. This is especially true if your red blood cells have gotten low (anemic). The transfusion raises the level of the red blood cells which carry oxygen, and this usually causes an energy increase.  The nurse administering the transfusion will monitor you carefully for complications. HOME CARE INSTRUCTIONS  No special instructions are needed after a transfusion. You may find your energy is better. Speak with your caregiver about any limitations on activity for underlying diseases you may have. SEEK MEDICAL CARE IF:   Your condition is not improving after your transfusion.  You develop redness or irritation at the intravenous (IV) site. SEEK IMMEDIATE MEDICAL CARE IF:  Any of the following symptoms occur over the next 12 hours:  Shaking chills.  You have a temperature by mouth above 102 F (38.9 C), not controlled by medicine.  Chest, back, or muscle pain.  People around you feel you are not acting correctly or are confused.  Shortness of breath or difficulty breathing.  Dizziness and fainting.  You get a rash or develop hives.  You have a decrease in urine output.  Your urine turns a dark color or changes to pink, red, or brown. Any of the following symptoms occur over the next 10 days:  You have a temperature by mouth above 102 F (38.9 C), not controlled by medicine.  Shortness of breath.  Weakness after normal activity.  The white part of the eye turns yellow (jaundice).  You have a decrease in the amount of urine or are urinating less often.  Your urine turns a dark color or changes to pink, red, or brown. Document Released: 04/04/2000 Document Revised: 06/30/2011 Document Reviewed: 11/22/2007 ExitCare Patient Information 2014 [place].  _______________________________________________________________________  Incentive Spirometer  An incentive spirometer is a tool that can help keep your lungs clear and active. This tool  measures how well you are filling your lungs with each breath. Taking long deep breaths may help reverse or decrease the chance of developing breathing (pulmonary) problems (especially infection) following:  A long period of time when you are unable to move or be active. BEFORE THE PROCEDURE   If the spirometer includes an indicator to show your best effort, your nurse or respiratory therapist will set it to a desired goal.  If possible, sit up straight or lean slightly forward. Try not to slouch.  Hold the incentive spirometer in an upright position. INSTRUCTIONS FOR USE  3. Sit on the edge of your bed if possible, or sit up as far as you can in bed or on a chair. 4. Hold the incentive spirometer in an upright position. 5. Breathe out normally. 6. Place the mouthpiece in your mouth and seal your lips tightly around it. 7. Breathe in slowly and as deeply as possible, raising the piston or the ball toward the top of the column. 8. Hold your breath for 3-5 seconds or for as long as possible. Allow the piston or ball to fall to the bottom of the column. 9. Remove the mouthpiece from your mouth and breathe out normally. 10. Rest for a few seconds and repeat Steps 1 through 7 at least 10 times every 1-2 hours when you are awake. Take your time and take a few normal breaths between deep breaths. 11. The spirometer may include an indicator to show your best effort. Use the indicator as a goal to work  toward during each repetition. 12. After each set of 10 deep breaths, practice coughing to be sure your lungs are clear. If you have an incision (the cut made at the time of surgery), support your incision when coughing by placing a pillow or rolled up towels firmly against it. Once you are able to get out of bed, walk around indoors and cough well. You may stop using the incentive spirometer when instructed by your caregiver.  RISKS AND COMPLICATIONS  Take your time so you do not get dizzy or  light-headed.  If you are in pain, you may need to take or ask for pain medication before doing incentive spirometry. It is harder to take a deep breath if you are having pain. AFTER USE  Rest and breathe slowly and easily.  It can be helpful to keep track of a log of your progress. Your caregiver can provide you with a simple table to help with this. If you are using the spirometer at home, follow these instructions: [place] IF:   You are having difficultly using the spirometer.  You have trouble using the spirometer as often as instructed.  Your pain medication is not giving enough relief while using the spirometer.  You develop fever of 100.5 F (38.1 C) or higher. SEEK IMMEDIATE MEDICAL CARE IF:   You cough up bloody sputum that had not been present before.  You develop fever of 102 F (38.9 C) or greater.  You develop worsening pain at or near the incision site. MAKE SURE YOU:   Understand these instructions.  Will watch your condition.  Will get help right away if you are not doing well or get worse. Document Released: 08/18/2006 Document Revised: 06/30/2011 Document Reviewed: 10/19/2006 [facility] Patient Information 2014 [place], [place].   ________________________________________________________________________

## 2014-11-29 ENCOUNTER — Encounter (HOSPITAL_COMMUNITY)
Admission: RE | Admit: 2014-11-29 | Discharge: 2014-11-29 | Disposition: A | Discharge status: Home / Self Care | Payer: Medicare Other | Source: Ambulatory Visit | Attending: Orthopedic Surgery | Admitting: Orthopedic Surgery

## 2014-11-29 ENCOUNTER — Ambulatory Visit (HOSPITAL_COMMUNITY)
Admission: RE | Admit: 2014-11-29 | Discharge: 2014-11-29 | Disposition: A | Discharge status: Home / Self Care | Payer: Medicare Other | Source: Ambulatory Visit | Attending: Anesthesiology | Admitting: Anesthesiology

## 2014-11-29 ENCOUNTER — Encounter (HOSPITAL_COMMUNITY): Payer: Self-pay

## 2014-11-29 DIAGNOSIS — Z952 Presence of prosthetic heart valve: Secondary | ICD-10-CM | POA: Diagnosis not present

## 2014-11-29 DIAGNOSIS — Z01812 Encounter for preprocedural laboratory examination: Secondary | ICD-10-CM | POA: Insufficient documentation

## 2014-11-29 DIAGNOSIS — Z0183 Encounter for blood typing: Secondary | ICD-10-CM | POA: Insufficient documentation

## 2014-11-29 DIAGNOSIS — M5134 Other intervertebral disc degeneration, thoracic region: Secondary | ICD-10-CM | POA: Diagnosis not present

## 2014-11-29 DIAGNOSIS — Z950 Presence of cardiac pacemaker: Secondary | ICD-10-CM | POA: Diagnosis not present

## 2014-11-29 DIAGNOSIS — M179 Osteoarthritis of knee, unspecified: Secondary | ICD-10-CM | POA: Insufficient documentation

## 2014-11-29 DIAGNOSIS — Z01818 Encounter for other preprocedural examination: Secondary | ICD-10-CM

## 2014-11-29 HISTORY — DX: Pneumonia, unspecified organism: J18.9

## 2014-11-29 HISTORY — DX: Reserved for inherently not codable concepts without codable children: IMO0001

## 2014-11-29 HISTORY — DX: Headache: R51

## 2014-11-29 HISTORY — DX: Headache, unspecified: R51.9

## 2014-11-29 HISTORY — DX: Presence of cardiac pacemaker: Z95.0

## 2014-11-29 HISTORY — DX: Dependence on supplemental oxygen: Z99.81

## 2014-11-29 LAB — URINE MICROSCOPIC-ADD ON

## 2014-11-29 LAB — PROTIME-INR
INR: 2.72 — AB (ref 0.00–1.49)
PROTHROMBIN TIME: 28.4 s — AB (ref 11.6–15.2)

## 2014-11-29 LAB — URINALYSIS, ROUTINE W REFLEX MICROSCOPIC
Bilirubin Urine: NEGATIVE
GLUCOSE, UA: NEGATIVE mg/dL
Hgb urine dipstick: NEGATIVE
KETONES UR: NEGATIVE mg/dL
Nitrite: NEGATIVE
PROTEIN: NEGATIVE mg/dL
Specific Gravity, Urine: 1.013 (ref 1.005–1.030)
UROBILINOGEN UA: 0.2 mg/dL (ref 0.0–1.0)
pH: 5.5 (ref 5.0–8.0)

## 2014-11-29 LAB — APTT: APTT: 46 s — AB (ref 24–37)

## 2014-11-29 LAB — SURGICAL PCR SCREEN
MRSA, PCR: NEGATIVE
STAPHYLOCOCCUS AUREUS: NEGATIVE

## 2014-11-29 LAB — ABO/RH: ABO/RH(D): O POS

## 2014-11-29 NOTE — Progress Notes (Signed)
Faxed Renal function panel done 11/27/14 on patient to office of Dr Durene Romans.

## 2014-11-29 NOTE — Progress Notes (Signed)
Requested LOV note from Dr Allena Katz on 11/28/2014.

## 2014-11-29 NOTE — Progress Notes (Signed)
LOV 11/28/2014 from Dr Allena Katz ( kidney ) on chart.

## 2014-11-29 NOTE — Progress Notes (Addendum)
LOV- Dr Ladona Ridgel- 10/10/14-EPIC 02/09/14-LOV- Dr Excell Seltzer in [facility]  EKG- 02/09/14- EPIC  01/2014- ECHO-EPIC  Last device check- 10/10/14- EPIC  Clearance Dr Ladona Ridgel on chart- and LOV of Dr Taylor-10/10/14 on chart  Labs of CBC/DIFF and renal function panel on chart done by Dr Allena Katz on 11/27/2014

## 2014-11-29 NOTE — Progress Notes (Signed)
PT,PTT,U/A and micro results done 11/29/2014 faxed via EPIC to Dr Durene Romans.

## 2014-12-02 DIAGNOSIS — I509 Heart failure, unspecified: Secondary | ICD-10-CM | POA: Diagnosis not present

## 2014-12-04 ENCOUNTER — Encounter (HOSPITAL_COMMUNITY)
Admission: RE | Disposition: A | Discharge status: Skilled Nursing Facility | Payer: Self-pay | Source: Ambulatory Visit | Attending: Orthopedic Surgery

## 2014-12-04 ENCOUNTER — Inpatient Hospital Stay (HOSPITAL_COMMUNITY)
Admission: RE | Admit: 2014-12-04 | Discharge: 2014-12-07 | DRG: 470 | Disposition: A | Discharge status: Skilled Nursing Facility | Payer: Medicare Other | Source: Ambulatory Visit | Attending: Orthopedic Surgery | Admitting: Orthopedic Surgery

## 2014-12-04 ENCOUNTER — Inpatient Hospital Stay (HOSPITAL_COMMUNITY): Payer: Medicare Other | Admitting: Certified Registered Nurse Anesthetist

## 2014-12-04 ENCOUNTER — Encounter (HOSPITAL_COMMUNITY): Payer: Self-pay | Admitting: *Deleted

## 2014-12-04 DIAGNOSIS — I10 Essential (primary) hypertension: Secondary | ICD-10-CM | POA: Diagnosis not present

## 2014-12-04 DIAGNOSIS — Z6834 Body mass index (BMI) 34.0-34.9, adult: Secondary | ICD-10-CM | POA: Diagnosis not present

## 2014-12-04 DIAGNOSIS — Z7982 Long term (current) use of aspirin: Secondary | ICD-10-CM

## 2014-12-04 DIAGNOSIS — F0390 Unspecified dementia without behavioral disturbance: Secondary | ICD-10-CM | POA: Diagnosis not present

## 2014-12-04 DIAGNOSIS — I129 Hypertensive chronic kidney disease with stage 1 through stage 4 chronic kidney disease, or unspecified chronic kidney disease: Secondary | ICD-10-CM | POA: Diagnosis present

## 2014-12-04 DIAGNOSIS — M109 Gout, unspecified: Secondary | ICD-10-CM | POA: Diagnosis not present

## 2014-12-04 DIAGNOSIS — I2510 Atherosclerotic heart disease of native coronary artery without angina pectoris: Secondary | ICD-10-CM | POA: Diagnosis not present

## 2014-12-04 DIAGNOSIS — Z86711 Personal history of pulmonary embolism: Secondary | ICD-10-CM

## 2014-12-04 DIAGNOSIS — Z96659 Presence of unspecified artificial knee joint: Secondary | ICD-10-CM

## 2014-12-04 DIAGNOSIS — M659 Synovitis and tenosynovitis, unspecified: Secondary | ICD-10-CM | POA: Diagnosis not present

## 2014-12-04 DIAGNOSIS — G4730 Sleep apnea, unspecified: Secondary | ICD-10-CM | POA: Diagnosis present

## 2014-12-04 DIAGNOSIS — I5042 Chronic combined systolic (congestive) and diastolic (congestive) heart failure: Secondary | ICD-10-CM | POA: Diagnosis present

## 2014-12-04 DIAGNOSIS — E039 Hypothyroidism, unspecified: Secondary | ICD-10-CM | POA: Diagnosis not present

## 2014-12-04 DIAGNOSIS — M25569 Pain in unspecified knee: Secondary | ICD-10-CM | POA: Diagnosis not present

## 2014-12-04 DIAGNOSIS — R41841 Cognitive communication deficit: Secondary | ICD-10-CM | POA: Diagnosis not present

## 2014-12-04 DIAGNOSIS — N183 Chronic kidney disease, stage 3 (moderate): Secondary | ICD-10-CM | POA: Diagnosis present

## 2014-12-04 DIAGNOSIS — M25561 Pain in right knee: Secondary | ICD-10-CM | POA: Diagnosis present

## 2014-12-04 DIAGNOSIS — K219 Gastro-esophageal reflux disease without esophagitis: Secondary | ICD-10-CM | POA: Diagnosis not present

## 2014-12-04 DIAGNOSIS — I4891 Unspecified atrial fibrillation: Secondary | ICD-10-CM | POA: Diagnosis not present

## 2014-12-04 DIAGNOSIS — Z8673 Personal history of transient ischemic attack (TIA), and cerebral infarction without residual deficits: Secondary | ICD-10-CM | POA: Diagnosis not present

## 2014-12-04 DIAGNOSIS — Z96651 Presence of right artificial knee joint: Secondary | ICD-10-CM

## 2014-12-04 DIAGNOSIS — M6281 Muscle weakness (generalized): Secondary | ICD-10-CM | POA: Diagnosis not present

## 2014-12-04 DIAGNOSIS — M1711 Unilateral primary osteoarthritis, right knee: Principal | ICD-10-CM | POA: Diagnosis present

## 2014-12-04 DIAGNOSIS — R2681 Unsteadiness on feet: Secondary | ICD-10-CM | POA: Diagnosis not present

## 2014-12-04 DIAGNOSIS — M25661 Stiffness of right knee, not elsewhere classified: Secondary | ICD-10-CM | POA: Diagnosis not present

## 2014-12-04 DIAGNOSIS — Z950 Presence of cardiac pacemaker: Secondary | ICD-10-CM

## 2014-12-04 DIAGNOSIS — E785 Hyperlipidemia, unspecified: Secondary | ICD-10-CM | POA: Diagnosis not present

## 2014-12-04 DIAGNOSIS — Z9889 Other specified postprocedural states: Secondary | ICD-10-CM | POA: Diagnosis not present

## 2014-12-04 DIAGNOSIS — Z471 Aftercare following joint replacement surgery: Secondary | ICD-10-CM | POA: Diagnosis not present

## 2014-12-04 DIAGNOSIS — Z952 Presence of prosthetic heart valve: Secondary | ICD-10-CM

## 2014-12-04 DIAGNOSIS — F329 Major depressive disorder, single episode, unspecified: Secondary | ICD-10-CM | POA: Diagnosis not present

## 2014-12-04 DIAGNOSIS — M179 Osteoarthritis of knee, unspecified: Secondary | ICD-10-CM | POA: Diagnosis not present

## 2014-12-04 HISTORY — PX: TOTAL KNEE ARTHROPLASTY: SHX125

## 2014-12-04 LAB — TYPE AND SCREEN
ABO/RH(D): O POS
Antibody Screen: NEGATIVE

## 2014-12-04 LAB — CBC
HCT: 29.4 % — ABNORMAL LOW (ref 36.0–46.0)
Hemoglobin: 9.5 g/dL — ABNORMAL LOW (ref 12.0–15.0)
MCH: 30.5 pg (ref 26.0–34.0)
MCHC: 32.3 g/dL (ref 30.0–36.0)
MCV: 94.5 fL (ref 78.0–100.0)
PLATELETS: 167 10*3/uL (ref 150–400)
RBC: 3.11 MIL/uL — AB (ref 3.87–5.11)
RDW: 14.4 % (ref 11.5–15.5)
WBC: 9.3 10*3/uL (ref 4.0–10.5)

## 2014-12-04 LAB — CREATININE, SERUM
CREATININE: 3.12 mg/dL — AB (ref 0.44–1.00)
GFR, EST AFRICAN AMERICAN: 15 mL/min — AB (ref >60)
GFR, EST NON AFRICAN AMERICAN: 13 mL/min — AB (ref >60)

## 2014-12-04 LAB — PROTIME-INR
INR: 1.16 (ref 0.00–1.49)
Prothrombin Time: 15 seconds (ref 11.6–15.2)

## 2014-12-04 SURGERY — ARTHROPLASTY, KNEE, TOTAL
Anesthesia: Spinal | Site: Knee | Laterality: Right

## 2014-12-04 MED ORDER — HYDROMORPHONE HCL 1 MG/ML IJ SOLN: 0.2500 mg | INTRAMUSCULAR | Status: DC | PRN

## 2014-12-04 MED ORDER — ONDANSETRON HCL 4 MG PO TABS: 4.0000 mg | ORAL_TABLET | Freq: Four times a day (QID) | ORAL | Status: DC | PRN

## 2014-12-04 MED ORDER — PROMETHAZINE HCL 25 MG/ML IJ SOLN: 6.2500 mg | INTRAMUSCULAR | Status: DC | PRN

## 2014-12-04 MED ORDER — ONDANSETRON HCL 4 MG/2ML IJ SOLN: 4.0000 mg | Freq: Four times a day (QID) | INTRAMUSCULAR | Status: DC | PRN

## 2014-12-04 MED ORDER — DEXAMETHASONE SODIUM PHOSPHATE 10 MG/ML IJ SOLN
INTRAMUSCULAR | Status: DC | PRN
  Administered 2014-12-04: 10 mg via INTRAVENOUS

## 2014-12-04 MED ORDER — PROPOFOL 10 MG/ML IV BOLUS
INTRAVENOUS | Status: AC
  Filled 2014-12-04: qty 20

## 2014-12-04 MED ORDER — CALCITRIOL 0.25 MCG PO CAPS
0.2500 ug | ORAL_CAPSULE | Freq: Every morning | ORAL | Status: DC
  Filled 2014-12-04: qty 1

## 2014-12-04 MED ORDER — BISACODYL 10 MG RE SUPP: 10.0000 mg | Freq: Every day | RECTAL | Status: DC | PRN

## 2014-12-04 MED ORDER — PROPOFOL INFUSION 10 MG/ML OPTIME
INTRAVENOUS | Status: DC | PRN
  Administered 2014-12-04: 100 ug/kg/min via INTRAVENOUS

## 2014-12-04 MED ORDER — LEVOTHYROXINE SODIUM 100 MCG PO TABS
100.0000 ug | ORAL_TABLET | Freq: Every day | ORAL | Status: DC
  Administered 2014-12-05 – 2014-12-07 (×3): 100 ug via ORAL
  Filled 2014-12-04 (×4): qty 1

## 2014-12-04 MED ORDER — LACTATED RINGERS IV SOLN
INTRAVENOUS | Status: DC | PRN
  Administered 2014-12-04 (×3): via INTRAVENOUS

## 2014-12-04 MED ORDER — FENTANYL CITRATE (PF) 100 MCG/2ML IJ SOLN
INTRAMUSCULAR | Status: AC
  Filled 2014-12-04: qty 4

## 2014-12-04 MED ORDER — CEFAZOLIN SODIUM-DEXTROSE 2-3 GM-% IV SOLR
INTRAVENOUS | Status: AC
  Filled 2014-12-04: qty 50

## 2014-12-04 MED ORDER — BUPIVACAINE IN DEXTROSE 0.75-8.25 % IT SOLN
INTRATHECAL | Status: DC | PRN
  Administered 2014-12-04: 1.6 mL via INTRATHECAL

## 2014-12-04 MED ORDER — ASPIRIN EC 81 MG PO TBEC
81.0000 mg | DELAYED_RELEASE_TABLET | Freq: Every day | ORAL | Status: DC
  Administered 2014-12-04 – 2014-12-06 (×3): 81 mg via ORAL
  Filled 2014-12-04 (×4): qty 1

## 2014-12-04 MED ORDER — PREGABALIN 75 MG PO CAPS
75.0000 mg | ORAL_CAPSULE | Freq: Two times a day (BID) | ORAL | Status: DC
  Administered 2014-12-04 – 2014-12-05 (×2): 75 mg via ORAL
  Filled 2014-12-04 (×2): qty 1

## 2014-12-04 MED ORDER — DULOXETINE HCL 60 MG PO CPEP
60.0000 mg | ORAL_CAPSULE | Freq: Every evening | ORAL | Status: DC
  Administered 2014-12-04 – 2014-12-06 (×3): 60 mg via ORAL
  Filled 2014-12-04 (×4): qty 1

## 2014-12-04 MED ORDER — TRANEXAMIC ACID 1000 MG/10ML IV SOLN
2000.0000 mg | Freq: Once | INTRAVENOUS | Status: DC
  Filled 2014-12-04: qty 20

## 2014-12-04 MED ORDER — KETOROLAC TROMETHAMINE 30 MG/ML IJ SOLN
INTRAMUSCULAR | Status: DC | PRN
  Administered 2014-12-04: 30 mg

## 2014-12-04 MED ORDER — MEMANTINE HCL 10 MG PO TABS
10.0000 mg | ORAL_TABLET | Freq: Two times a day (BID) | ORAL | Status: DC
  Administered 2014-12-04 – 2014-12-05 (×2): 10 mg via ORAL
  Filled 2014-12-04 (×3): qty 1

## 2014-12-04 MED ORDER — SODIUM CHLORIDE 0.9 % IV SOLN
2000.0000 mg | INTRAVENOUS | Status: DC | PRN
  Administered 2014-12-04: 2000 mg via TOPICAL

## 2014-12-04 MED ORDER — PHENOL 1.4 % MT LIQD: 1.0000 | OROMUCOSAL | Status: DC | PRN

## 2014-12-04 MED ORDER — CEFAZOLIN SODIUM-DEXTROSE 2-3 GM-% IV SOLR
2.0000 g | Freq: Four times a day (QID) | INTRAVENOUS | Status: AC
  Administered 2014-12-04 – 2014-12-05 (×2): 2 g via INTRAVENOUS
  Filled 2014-12-04 (×2): qty 50

## 2014-12-04 MED ORDER — MEPERIDINE HCL 50 MG/ML IJ SOLN: 6.2500 mg | INTRAMUSCULAR | Status: DC | PRN

## 2014-12-04 MED ORDER — SODIUM CHLORIDE 0.9 % IV SOLN
10.0000 mg | INTRAVENOUS | Status: DC | PRN
  Administered 2014-12-04: 40 ug/min via INTRAVENOUS

## 2014-12-04 MED ORDER — METOCLOPRAMIDE HCL 5 MG/ML IJ SOLN: 5.0000 mg | Freq: Three times a day (TID) | INTRAMUSCULAR | Status: DC | PRN

## 2014-12-04 MED ORDER — SODIUM CHLORIDE 0.9 % IV SOLN
INTRAVENOUS | Status: DC
  Administered 2014-12-04: 17:00:00 via INTRAVENOUS
  Filled 2014-12-04 (×7): qty 1000

## 2014-12-04 MED ORDER — POLYETHYLENE GLYCOL 3350 17 G PO PACK
17.0000 g | PACK | Freq: Two times a day (BID) | ORAL | Status: DC
  Administered 2014-12-04 – 2014-12-07 (×6): 17 g via ORAL

## 2014-12-04 MED ORDER — DEXAMETHASONE SODIUM PHOSPHATE 10 MG/ML IJ SOLN: 10.0000 mg | Freq: Once | INTRAMUSCULAR | Status: DC

## 2014-12-04 MED ORDER — COUMADIN BOOK: Freq: Once | Status: DC

## 2014-12-04 MED ORDER — ONDANSETRON HCL 4 MG/2ML IJ SOLN
INTRAMUSCULAR | Status: AC
  Filled 2014-12-04: qty 2

## 2014-12-04 MED ORDER — METOCLOPRAMIDE HCL 10 MG PO TABS: 5.0000 mg | ORAL_TABLET | Freq: Three times a day (TID) | ORAL | Status: DC | PRN

## 2014-12-04 MED ORDER — ONDANSETRON HCL 4 MG/2ML IJ SOLN
INTRAMUSCULAR | Status: DC | PRN
  Administered 2014-12-04 (×2): 2 mg via INTRAVENOUS

## 2014-12-04 MED ORDER — SODIUM CHLORIDE 0.9 % IJ SOLN
INTRAMUSCULAR | Status: DC | PRN
  Administered 2014-12-04: 29 mL

## 2014-12-04 MED ORDER — NON FORMULARY: 40.0000 mg | Freq: Every day | Status: DC

## 2014-12-04 MED ORDER — COLCHICINE 0.6 MG PO TABS
0.6000 mg | ORAL_TABLET | ORAL | Status: DC
  Administered 2014-12-04 – 2014-12-06 (×2): 0.6 mg via ORAL
  Filled 2014-12-04 (×2): qty 1

## 2014-12-04 MED ORDER — MIDAZOLAM HCL 2 MG/2ML IJ SOLN
INTRAMUSCULAR | Status: AC
  Filled 2014-12-04: qty 4

## 2014-12-04 MED ORDER — CELECOXIB 200 MG PO CAPS
200.0000 mg | ORAL_CAPSULE | Freq: Two times a day (BID) | ORAL | Status: DC
  Administered 2014-12-04 – 2014-12-05 (×2): 200 mg via ORAL
  Filled 2014-12-04 (×2): qty 1

## 2014-12-04 MED ORDER — CHLORHEXIDINE GLUCONATE 4 % EX LIQD: 60.0000 mL | Freq: Once | CUTANEOUS | Status: DC

## 2014-12-04 MED ORDER — LINACLOTIDE 290 MCG PO CAPS
290.0000 ug | ORAL_CAPSULE | ORAL | Status: DC
  Administered 2014-12-05 – 2014-12-07 (×2): 290 ug via ORAL
  Filled 2014-12-04 (×3): qty 1

## 2014-12-04 MED ORDER — KETOROLAC TROMETHAMINE 30 MG/ML IJ SOLN
INTRAMUSCULAR | Status: AC
Start: 2014-12-04 — End: 2014-12-04
  Filled 2014-12-04: qty 1

## 2014-12-04 MED ORDER — ENOXAPARIN SODIUM 40 MG/0.4ML ~~LOC~~ SOLN
40.0000 mg | SUBCUTANEOUS | Status: DC
  Administered 2014-12-05: 40 mg via SUBCUTANEOUS
  Filled 2014-12-04 (×2): qty 0.4

## 2014-12-04 MED ORDER — WARFARIN VIDEO: Freq: Once | Status: DC

## 2014-12-04 MED ORDER — ALUM & MAG HYDROXIDE-SIMETH 200-200-20 MG/5ML PO SUSP: 30.0000 mL | ORAL | Status: DC | PRN

## 2014-12-04 MED ORDER — METHOCARBAMOL 500 MG PO TABS
500.0000 mg | ORAL_TABLET | Freq: Four times a day (QID) | ORAL | Status: DC | PRN
  Administered 2014-12-05 – 2014-12-07 (×6): 500 mg via ORAL
  Filled 2014-12-04 (×6): qty 1

## 2014-12-04 MED ORDER — DEXAMETHASONE SODIUM PHOSPHATE 10 MG/ML IJ SOLN
INTRAMUSCULAR | Status: AC
  Filled 2014-12-04: qty 1

## 2014-12-04 MED ORDER — TORSEMIDE 20 MG PO TABS
20.0000 mg | ORAL_TABLET | Freq: Two times a day (BID) | ORAL | Status: DC
  Administered 2014-12-04 – 2014-12-07 (×6): 20 mg via ORAL
  Filled 2014-12-04 (×8): qty 1

## 2014-12-04 MED ORDER — BUPIVACAINE-EPINEPHRINE (PF) 0.25% -1:200000 IJ SOLN
INTRAMUSCULAR | Status: AC
Start: 2014-12-04 — End: 2014-12-04
  Filled 2014-12-04: qty 30

## 2014-12-04 MED ORDER — MENTHOL 3 MG MT LOZG
1.0000 | LOZENGE | OROMUCOSAL | Status: DC | PRN
  Filled 2014-12-04: qty 9

## 2014-12-04 MED ORDER — LORATADINE 10 MG PO TABS
10.0000 mg | ORAL_TABLET | Freq: Every day | ORAL | Status: DC
  Administered 2014-12-05 – 2014-12-07 (×3): 10 mg via ORAL
  Filled 2014-12-04 (×3): qty 1

## 2014-12-04 MED ORDER — SODIUM CHLORIDE 0.9 % IJ SOLN
INTRAMUSCULAR | Status: AC
  Filled 2014-12-04: qty 50

## 2014-12-04 MED ORDER — FERROUS SULFATE 325 (65 FE) MG PO TABS
325.0000 mg | ORAL_TABLET | Freq: Three times a day (TID) | ORAL | Status: DC
  Administered 2014-12-04 – 2014-12-07 (×8): 325 mg via ORAL
  Filled 2014-12-04 (×11): qty 1

## 2014-12-04 MED ORDER — WARFARIN - PHARMACIST DOSING INPATIENT: Freq: Every day | Status: DC

## 2014-12-04 MED ORDER — 0.9 % SODIUM CHLORIDE (POUR BTL) OPTIME
TOPICAL | Status: DC | PRN
  Administered 2014-12-04: 1000 mL

## 2014-12-04 MED ORDER — HYDROCODONE-ACETAMINOPHEN 10-325 MG PO TABS
1.0000 | ORAL_TABLET | ORAL | Status: DC
  Administered 2014-12-04 – 2014-12-05 (×4): 1 via ORAL
  Administered 2014-12-05 (×2): 2 via ORAL
  Administered 2014-12-06 – 2014-12-07 (×8): 1 via ORAL
  Filled 2014-12-04: qty 2
  Filled 2014-12-04 (×8): qty 1
  Filled 2014-12-04: qty 2
  Filled 2014-12-04 (×5): qty 1

## 2014-12-04 MED ORDER — DOCUSATE SODIUM 100 MG PO CAPS
100.0000 mg | ORAL_CAPSULE | Freq: Two times a day (BID) | ORAL | Status: DC
  Administered 2014-12-04 – 2014-12-07 (×6): 100 mg via ORAL

## 2014-12-04 MED ORDER — LINACLOTIDE 145 MCG PO CAPS
145.0000 ug | ORAL_CAPSULE | ORAL | Status: DC
  Administered 2014-12-04 – 2014-12-06 (×2): 145 ug via ORAL
  Filled 2014-12-04 (×2): qty 1

## 2014-12-04 MED ORDER — HYDRALAZINE HCL 10 MG PO TABS
10.0000 mg | ORAL_TABLET | Freq: Three times a day (TID) | ORAL | Status: DC
  Administered 2014-12-04 – 2014-12-06 (×8): 10 mg via ORAL
  Filled 2014-12-04 (×13): qty 1

## 2014-12-04 MED ORDER — CYCLOSPORINE 0.05 % OP EMUL
1.0000 [drp] | Freq: Two times a day (BID) | OPHTHALMIC | Status: DC | PRN
  Filled 2014-12-04: qty 1

## 2014-12-04 MED ORDER — DEXAMETHASONE SODIUM PHOSPHATE 10 MG/ML IJ SOLN
10.0000 mg | Freq: Once | INTRAMUSCULAR | Status: AC
  Administered 2014-12-05: 10 mg via INTRAVENOUS
  Filled 2014-12-04: qty 1

## 2014-12-04 MED ORDER — AMIODARONE HCL 100 MG PO TABS
100.0000 mg | ORAL_TABLET | Freq: Every morning | ORAL | Status: DC
  Administered 2014-12-05 – 2014-12-06 (×2): 100 mg via ORAL
  Filled 2014-12-04 (×3): qty 1

## 2014-12-04 MED ORDER — METHOCARBAMOL 1000 MG/10ML IJ SOLN
500.0000 mg | Freq: Four times a day (QID) | INTRAMUSCULAR | Status: DC | PRN
  Filled 2014-12-04: qty 5

## 2014-12-04 MED ORDER — NITROGLYCERIN 0.4 MG SL SUBL: 0.4000 mg | SUBLINGUAL_TABLET | SUBLINGUAL | Status: DC | PRN

## 2014-12-04 MED ORDER — HYDROMORPHONE HCL 1 MG/ML IJ SOLN
0.5000 mg | INTRAMUSCULAR | Status: DC | PRN
  Administered 2014-12-04: 0.5 mg via INTRAVENOUS
  Filled 2014-12-04: qty 1

## 2014-12-04 MED ORDER — MAGNESIUM CITRATE PO SOLN: 1.0000 | Freq: Once | ORAL | Status: DC | PRN

## 2014-12-04 MED ORDER — LACTATED RINGERS IV SOLN
INTRAVENOUS | Status: DC
  Administered 2014-12-04: 1000 mL via INTRAVENOUS

## 2014-12-04 MED ORDER — DONEPEZIL HCL 10 MG PO TABS
10.0000 mg | ORAL_TABLET | Freq: Every day | ORAL | Status: DC
  Administered 2014-12-04 – 2014-12-06 (×3): 10 mg via ORAL
  Filled 2014-12-04 (×4): qty 1

## 2014-12-04 MED ORDER — WARFARIN SODIUM 3 MG PO TABS
3.0000 mg | ORAL_TABLET | Freq: Once | ORAL | Status: AC
  Administered 2014-12-04: 3 mg via ORAL
  Filled 2014-12-04: qty 1

## 2014-12-04 MED ORDER — FEBUXOSTAT 40 MG PO TABS
40.0000 mg | ORAL_TABLET | Freq: Every evening | ORAL | Status: DC
  Administered 2014-12-04 – 2014-12-06 (×3): 40 mg via ORAL
  Filled 2014-12-04 (×5): qty 1

## 2014-12-04 MED ORDER — PRAVASTATIN SODIUM 40 MG PO TABS
40.0000 mg | ORAL_TABLET | Freq: Every evening | ORAL | Status: DC
  Administered 2014-12-04 – 2014-12-06 (×3): 40 mg via ORAL
  Filled 2014-12-04 (×4): qty 1

## 2014-12-04 MED ORDER — BUPIVACAINE-EPINEPHRINE (PF) 0.25% -1:200000 IJ SOLN
INTRAMUSCULAR | Status: DC | PRN
  Administered 2014-12-04: 30 mL

## 2014-12-04 MED ORDER — CARVEDILOL 12.5 MG PO TABS
12.5000 mg | ORAL_TABLET | Freq: Two times a day (BID) | ORAL | Status: DC
  Administered 2014-12-04 – 2014-12-06 (×5): 12.5 mg via ORAL
  Filled 2014-12-04 (×8): qty 1

## 2014-12-04 MED ORDER — ESOMEPRAZOLE MAGNESIUM 40 MG PO CPDR
40.0000 mg | DELAYED_RELEASE_CAPSULE | Freq: Every day | ORAL | Status: AC
  Administered 2014-12-05 – 2014-12-07 (×3): 40 mg via ORAL
  Filled 2014-12-04 (×3): qty 1

## 2014-12-04 MED ORDER — PHENYLEPHRINE HCL 10 MG/ML IJ SOLN
INTRAMUSCULAR | Status: AC
  Filled 2014-12-04: qty 1

## 2014-12-04 MED ORDER — FENTANYL CITRATE (PF) 100 MCG/2ML IJ SOLN
INTRAMUSCULAR | Status: DC | PRN
  Administered 2014-12-04 (×4): 25 ug via INTRAVENOUS

## 2014-12-04 MED ORDER — SODIUM CHLORIDE 0.9 % IR SOLN
Status: DC | PRN
  Administered 2014-12-04: 1000 mL

## 2014-12-04 MED ORDER — MIDAZOLAM HCL 2 MG/2ML IJ SOLN: 0.5000 mg | Freq: Once | INTRAMUSCULAR | Status: DC | PRN

## 2014-12-04 MED ORDER — CEFAZOLIN SODIUM-DEXTROSE 2-3 GM-% IV SOLR
2.0000 g | INTRAVENOUS | Status: AC
  Administered 2014-12-04: 2 g via INTRAVENOUS

## 2014-12-04 MED ORDER — DIPHENHYDRAMINE HCL 25 MG PO CAPS: 25.0000 mg | ORAL_CAPSULE | Freq: Four times a day (QID) | ORAL | Status: DC | PRN

## 2014-12-04 SURGICAL SUPPLY — 62 items
BAG DECANTER FOR FLEXI CONT (MISCELLANEOUS) ×2 IMPLANT
BAG SPEC THK2 15X12 ZIP CLS (MISCELLANEOUS)
BAG ZIPLOCK 12X15 (MISCELLANEOUS) IMPLANT
BANDAGE ELASTIC 6 VELCRO ST LF (GAUZE/BANDAGES/DRESSINGS) ×3 IMPLANT
BANDAGE ESMARK 6X9 LF (GAUZE/BANDAGES/DRESSINGS) ×1 IMPLANT
BLADE SAW SGTL 13.0X1.19X90.0M (BLADE) ×3 IMPLANT
BNDG CMPR 9X6 STRL LF SNTH (GAUZE/BANDAGES/DRESSINGS) ×1
BNDG ESMARK 6X9 LF (GAUZE/BANDAGES/DRESSINGS) ×3
BOWL SMART MIX CTS (DISPOSABLE) ×3 IMPLANT
CAPT KNEE TOTAL 3 ATTUNE ×2 IMPLANT
CEMENT HV SMART SET (Cement) ×4 IMPLANT
CUFF TOURN SGL QUICK 34 (TOURNIQUET CUFF) ×3
CUFF TRNQT CYL 34X4X40X1 (TOURNIQUET CUFF) ×1 IMPLANT
DECANTER SPIKE VIAL GLASS SM (MISCELLANEOUS) ×3 IMPLANT
DRAPE EXTREMITY T 121X128X90 (DRAPE) ×3 IMPLANT
DRAPE POUCH INSTRU U-SHP 10X18 (DRAPES) ×3 IMPLANT
DRAPE U-SHAPE 47X51 STRL (DRAPES) ×3 IMPLANT
DRSG AQUACEL AG ADV 3.5X10 (GAUZE/BANDAGES/DRESSINGS) ×3 IMPLANT
DURAPREP 26ML APPLICATOR (WOUND CARE) ×6 IMPLANT
ELECT REM PT RETURN 9FT ADLT (ELECTROSURGICAL) ×3
ELECTRODE REM PT RTRN 9FT ADLT (ELECTROSURGICAL) ×1 IMPLANT
FACESHIELD WRAPAROUND (MASK) ×15 IMPLANT
FACESHIELD WRAPAROUND OR TEAM (MASK) ×5 IMPLANT
GLOVE BIO SURGEON STRL SZ7.5 (GLOVE) ×2 IMPLANT
GLOVE BIOGEL PI IND STRL 6.5 (GLOVE) IMPLANT
GLOVE BIOGEL PI IND STRL 7.0 (GLOVE) IMPLANT
GLOVE BIOGEL PI IND STRL 7.5 (GLOVE) ×1 IMPLANT
GLOVE BIOGEL PI IND STRL 8.5 (GLOVE) ×1 IMPLANT
GLOVE BIOGEL PI INDICATOR 6.5 (GLOVE) ×2
GLOVE BIOGEL PI INDICATOR 7.0 (GLOVE) ×2
GLOVE BIOGEL PI INDICATOR 7.5 (GLOVE) ×4
GLOVE BIOGEL PI INDICATOR 8.5 (GLOVE) ×2
GLOVE ECLIPSE 8.0 STRL XLNG CF (GLOVE) ×5 IMPLANT
GLOVE ORTHO TXT STRL SZ7.5 (GLOVE) ×6 IMPLANT
GLOVE SURG SS PI 7.0 STRL IVOR (GLOVE) ×2 IMPLANT
GOWN SPEC L3 XXLG W/TWL (GOWN DISPOSABLE) ×3 IMPLANT
GOWN STRL REUS W/TWL LRG LVL3 (GOWN DISPOSABLE) ×5 IMPLANT
GOWN STRL REUS W/TWL XL LVL3 (GOWN DISPOSABLE) ×2 IMPLANT
HANDPIECE INTERPULSE COAX TIP (DISPOSABLE) ×3
KIT BASIN OR (CUSTOM PROCEDURE TRAY) ×3 IMPLANT
LIQUID BAND (GAUZE/BANDAGES/DRESSINGS) ×3 IMPLANT
MANIFOLD NEPTUNE II (INSTRUMENTS) ×3 IMPLANT
NDL SAFETY ECLIPSE 18X1.5 (NEEDLE) ×1 IMPLANT
NEEDLE HYPO 18GX1.5 SHARP (NEEDLE) ×6
PACK TOTAL JOINT (CUSTOM PROCEDURE TRAY) ×3 IMPLANT
PEN SKIN MARKING BROAD (MISCELLANEOUS) ×3 IMPLANT
POSITIONER SURGICAL ARM (MISCELLANEOUS) ×3 IMPLANT
SET HNDPC FAN SPRY TIP SCT (DISPOSABLE) ×1 IMPLANT
SET PAD KNEE POSITIONER (MISCELLANEOUS) ×3 IMPLANT
SUCTION FRAZIER 12FR DISP (SUCTIONS) ×3 IMPLANT
SUT MNCRL AB 4-0 PS2 18 (SUTURE) ×3 IMPLANT
SUT VIC AB 1 CT1 36 (SUTURE) ×3 IMPLANT
SUT VIC AB 2-0 CT1 27 (SUTURE) ×9
SUT VIC AB 2-0 CT1 TAPERPNT 27 (SUTURE) ×3 IMPLANT
SUT VLOC 180 0 24IN GS25 (SUTURE) ×3 IMPLANT
SYR 50ML LL SCALE MARK (SYRINGE) ×5 IMPLANT
TOWEL OR 17X26 10 PK STRL BLUE (TOWEL DISPOSABLE) ×3 IMPLANT
TOWEL OR NON WOVEN STRL DISP B (DISPOSABLE) ×2 IMPLANT
TRAY FOLEY W/METER SILVER 14FR (SET/KITS/TRAYS/PACK) ×3 IMPLANT
WATER STERILE IRR 1500ML POUR (IV SOLUTION) ×3 IMPLANT
WRAP KNEE MAXI GEL POST OP (GAUZE/BANDAGES/DRESSINGS) ×3 IMPLANT
YANKAUER SUCT BULB TIP 10FT TU (MISCELLANEOUS) ×3 IMPLANT

## 2014-12-04 NOTE — Progress Notes (Signed)
ANTICOAGULATION CONSULT NOTE - Initial Consult  Pharmacy Consult for warfarin Indication: VTE prophylaxis  Allergies  Allergen Reactions  . Codeine Hives  . Darvocet [Propoxyphene N-Acetaminophen] Hives  . Nubain [Nalbuphine Hcl] Hives    Went into cardiac arrest     Patient Measurements: Height: 5' (152.4 cm) Weight: 169 lb (76.658 kg) IBW/kg (Calculated) : 45.5  Vital Signs: Temp: 98.3 F (36.8 C) (08/15 1845) Temp Source: Oral (08/15 1845) BP: 147/56 mmHg (08/15 1845) Pulse Rate: 65 (08/15 1845)  Labs:  Recent Labs  12/04/14 0915  LABPROT 15.0  INR 1.16    CrCl cannot be calculated (Patient has no serum creatinine result on file.).   Medical History: Past Medical History  Diagnosis Date  . Syncope   . Symptomatic bradycardia   . Chronic lower back pain   . Nonischemic cardiomyopathy   . HTN (hypertension)   . HLD (hyperlipidemia)   . Atrial fibrillation     tachy-brady syndrome with <1% recurrent PAF since pacemaker placement  . H/O: stroke   . Pulmonary embolism     HISTORY OF, the pt. had a recurrent bilateral pulmonary emboli in 2005, on warfarin therapy and at which time she under went implantation of IVC filter  . Anemia   . Dementia   . Gastroesophageal reflux disease   . Aortic stenosis 10/13/2012    Low EF, low gradient with severe aortic stenosis confirmed by dobutamine stress echocardiogram s/p TAVR 12/2012  . PONV (postoperative nausea and vomiting)   . Osteoarthritis   . Neuropathy   . Spinal stenosis   . Symptomatic bradycardia 2012    s/p Medtr[employer] PPM  . Chronic combined systolic and diastolic CHF (congestive heart failure)   . Fibromyalgia   . Presence of permanent cardiac pacemaker   . Sleep apnea     uses oxygen at night and PRN- not used since > 6 months   . Shortness of breath dyspnea     with exertion   . Pneumonia     hx of x 3   . Acute on chronic renal failure     sees Dr Allena Katz   . CKD (chronic kidney disease)   .  Headache     hx of migraines   . On home oxygen therapy     patient uses at nite- 2L- has not used in > 6 months per patient    Medications:  Prescriptions prior to admission  Medication Sig Dispense Refill Last Dose  . alendronate (FOSAMAX) 70 MG tablet Take 70 mg by mouth once a week. Saturday.   12/02/2014  . amiodarone (PACERONE) 200 MG tablet Take 0.5 tablets (100 mg total) by mouth daily. (Patient taking differently: Take 100 mg by mouth every morning. )   12/04/2014 at 0750  . aspirin EC 81 MG EC tablet Take 1 tablet (81 mg total) by mouth daily. (Patient taking differently: Take 81 mg by mouth at bedtime. )   12/03/2014 at Unknown time  . BIOTIN 5000 PO Take 5,000 mcg by mouth every evening.    12/03/2014 at Unknown time  . calcitRIOL (ROCALTROL) 0.25 MCG capsule Take 0.25 mcg by mouth every morning.    12/03/2014 at Unknown time  . carvedilol (COREG) 12.5 MG tablet Take 1 tablet by mouth 2 (two) times daily.  3 12/04/2014 at 0750  . cetirizine (ZYRTEC) 10 MG tablet Take 10 mg by mouth every morning.    12/04/2014 at 0750  . Cholecalciferol (VITAMIN D) 1000 UNITS capsule  Take 2,000 Units by mouth at bedtime.    12/03/2014 at Unknown time  . colchicine 0.6 MG tablet Take 0.6 mg by mouth every other day. PM   12/02/2014 at Unknown time  . cycloSPORINE (RESTASIS) 0.05 % ophthalmic emulsion Place 1 drop into both eyes 2 (two) times daily as needed (dry eyes.).   Past Week at Unknown time  . docusate sodium (COLACE) 50 MG capsule Take 50 mg by mouth 2 (two) times daily.    12/03/2014 at Unknown time  . donepezil (ARICEPT) 10 MG tablet Take 10 mg by mouth at bedtime.    12/03/2014 at Unknown time  . DULoxetine (CYMBALTA) 60 MG capsule Take 60 mg by mouth every evening.    12/03/2014 at Unknown time  . enoxaparin (LOVENOX) 40 MG/0.4ML injection Inject 40 mg into the skin daily. Starts on 11/30/14   Filled at [facility] [address]  1 12/03/2014 at PM  . esomeprazole (NEXIUM) 40 MG capsule Take 40  mg by mouth daily before breakfast.     12/04/2014 at 0750  . hydrALAZINE (APRESOLINE) 10 MG tablet Take 10 mg by mouth 3 (three) times daily.    12/04/2014 at 0750  . HYDROcodone-acetaminophen (NORCO) 10-325 MG per tablet Take 1-1.5 tablets by mouth 3 (three) times daily. Takes one tablet in the morning and mid day and then one and a half tablet at night.   12/04/2014 at 0750  . levothyroxine (SYNTHROID, LEVOTHROID) 100 MCG tablet Take 100 mcg by mouth every morning.    12/04/2014 at 0750  . Linaclotide (LINZESS) 145 MCG CAPS capsule Take 145 mcg by mouth every other day. Alternates with the 290 mg tablet in the am.   12/02/2014 at Unknown time  . Linaclotide 290 MCG CAPS Take 290 mcg by mouth every other day. Alternates with the 145 mg tablets in the morning.   12/03/2014 at Unknown time  . memantine (NAMENDA) 10 MG tablet Take 10 mg by mouth 2 (two) times daily.    12/04/2014 at 0750  . pravastatin (PRAVACHOL) 40 MG tablet Take 40 mg by mouth every evening.    12/03/2014 at Unknown time  . pregabalin (LYRICA) 75 MG capsule Take 75 mg by mouth 2 (two) times daily.    12/04/2014 at 0750  . torsemide (DEMADEX) 20 MG tablet Take 20 mg by mouth 2 (two) times daily. PEr Dr Allena Katz - patient to not take on 8/14 nor am of 8/15   12/02/2014  . Turmeric 500 MG CAPS Take 1 capsule by mouth 2 (two) times daily.   Past Week at Unknown time  . ULORIC 40 MG tablet Take 40 mg by mouth every evening.    12/03/2014 at Unknown time  . vitamin B-12 (CYANOCOBALAMIN) 1000 MCG tablet Take 1,000 mcg by mouth 2 (two) times a week. Tuesday and Friday   12/01/2014  . warfarin (COUMADIN) 2.5 MG tablet Take 2.5 mg by mouth daily.   11/27/2014  . Multiple Vitamin (MULTIVITAMIN) tablet Take 1 tablet by mouth every morning.    More than a month at Unknown time  . nitroGLYCERIN (NITROSTAT) 0.4 MG SL tablet Place 0.4 mg under the tongue every 5 (five) minutes as needed for chest pain (MAX 3 TABLETS).    More than a month at Unknown time  . NON  FORMULARY 2 Liters oxygen as needed for shortness of breath   More than a month at Unknown time   Scheduled:  . [START ON 12/05/2014] amiodarone  100  mg Oral q morning - 10a  . aspirin EC  81 mg Oral QHS  . [START ON 12/05/2014] calcitRIOL  0.25 mcg Oral q morning - 10a  . carvedilol  12.5 mg Oral BID  .  ceFAZolin (ANCEF) IV  2 g Intravenous Q6H  . celecoxib  200 mg Oral Q12H  . colchicine  0.6 mg Oral QODAY  . [START ON 12/05/2014] dexamethasone  10 mg Intravenous Once  . docusate sodium  100 mg Oral BID  . donepezil  10 mg Oral QHS  . DULoxetine  60 mg Oral QPM  . [START ON 12/05/2014] enoxaparin (LOVENOX) injection  40 mg Subcutaneous Q24H  . [START ON 12/05/2014] esomeprazole  40 mg Oral QAC breakfast  . febuxostat  40 mg Oral QPM  . ferrous sulfate  325 mg Oral TID PC  . hydrALAZINE  10 mg Oral TID  . HYDROcodone-acetaminophen  1-2 tablet Oral 6 times per day  . [START ON 12/05/2014] levothyroxine  100 mcg Oral QAC breakfast  . Linaclotide  145 mcg Oral QODAY  . [START ON 12/05/2014] Linaclotide  290 mcg Oral QODAY  . [START ON 12/05/2014] loratadine  10 mg Oral Daily  . memantine  10 mg Oral BID  . polyethylene glycol  17 g Oral BID  . pravastatin  40 mg Oral QPM  . pregabalin  75 mg Oral BID  . torsemide  20 mg Oral BID  . warfarin  3 mg Oral Once  . [START ON 12/05/2014] Warfarin - Pharmacist Dosing Inpatient   Does not apply q1800    Assessment: 35 yoF s/p R TKA; on warfarin PTA for AFib.  Pharmacy to resume warfarin while admitted; to continue on Lovenox for VTE ppx.   Baseline INR: wnl  Prior anticoagulation: warfarin 2.5 mg daily; bridged to procedure with Lovenox 40 mg daily  Today, 12/04/2014:  CBC: wnl  INR subtherapeutic  Major drug interactions: amiodarone, celecoxib (both PTA meds along with warfarin)  No bleeding issues per nursing  Diet: regular  Goal of Therapy: INR 2-3  Plan:  Warfarin 3 mg PO tonight  Daily INR  CBC at least q72 hr while on  warfarin  Monitor for signs of bleeding or thrombosis   Bernadene Person, PharmD Pager: [phone] 12/04/2014, 9:40 PM

## 2014-12-04 NOTE — Care Management Important Message (Signed)
Important Message  Patient Details  Name: [name] MRN: [id number] Date of Birth: [birth year]   Medicare Important Message Given:  Yes-second notification given    Renie Ora 12/04/2014, 4:54 PMImportant Message  Patient Details  Name: [name] MRN: [id number] Date of Birth: [birth year]   Medicare Important Message Given:  Yes-second notification given    Renie Ora 12/04/2014, 4:54 PM

## 2014-12-04 NOTE — Anesthesia Procedure Notes (Signed)
Spinal Patient location during procedure: OR Start time: 12/04/2014 12:12 PM End time: 12/04/2014 12:17 PM Staffing Resident/CRNA: Dion Saucier E Performed by: resident/CRNA  Preanesthetic Checklist Completed: patient identified, site marked, surgical consent, pre-op evaluation, timeout performed, IV checked, risks and benefits discussed and monitors and equipment checked Spinal Block Patient position: sitting Prep: Betadine and site prepped and draped Patient monitoring: heart rate, continuous pulse ox and blood pressure Approach: midline Location: L3-4 Injection technique: single-shot Needle Needle type: Spinocan  Needle gauge: 22 G Needle length: 9 cm Additional Notes Kit expiration date checked. Negative heme, paresthesias, good flow tolerated well.

## 2014-12-04 NOTE — Anesthesia Postprocedure Evaluation (Signed)
  Anesthesia Post-op Note  Patient: [name]  Procedure(s) Performed: Procedure(s): RIGHT TOTAL  KNEE ARTHROPLASTY (Right)  Patient Location: PACU  Anesthesia Type:Spinal  Level of Consciousness: awake, alert , oriented and patient cooperative  Airway and Oxygen Therapy: Patient Spontanous Breathing and Patient connected to nasal cannula oxygen  Post-op Pain: none  Post-op Assessment: Post-op Vital signs reviewed, Patient's Cardiovascular Status Stable, Respiratory Function Stable, Patent Airway, No signs of Nausea or vomiting, Pain level controlled, No headache, No backache and Spinal receding LLE Motor Response: Purposeful movement LLE Sensation: Numbness RLE Motor Response: Purposeful movement RLE Sensation: Numbness L Sensory Level: L2-Upper inner thigh, upper buttock R Sensory Level: L2-Upper inner thigh, upper buttock  Post-op Vital Signs: Reviewed and stable  Last Vitals:  Filed Vitals:   12/04/14 1500  BP: 123/43  Pulse: 64  Temp:   Resp: 19    Complications: No apparent anesthesia complications

## 2014-12-04 NOTE — Op Note (Signed)
NAME:  [name]                      MEDICAL RECORD NO.:  [id number]                             FACILITY:  [facility]      PHYSICIAN:  Madlyn Frankel. Charlann Boxer, M.D.  DATE OF BIRTH:  [birth year]      DATE OF PROCEDURE:  12/04/2014                                     OPERATIVE REPORT         PREOPERATIVE DIAGNOSIS:  Right knee osteoarthritis.      POSTOPERATIVE DIAGNOSIS:  Right knee osteoarthritis.      FINDINGS:  The patient was noted to have complete loss of cartilage and   bone-on-bone arthritis with associated osteophytes in the lateral and patellofemoral compartments of   the knee with a significant synovitis and associated effusion.      PROCEDURE:  Right total knee replacement.      COMPONENTS USED:  DePuy Attune rotating platform posterior stabilized knee   system, a size 5N femur, 4 tibia, size 6 mm PS AOX insert, and 35mm anatomic patellar   button.      SURGEON:  Madlyn Frankel. Charlann Boxer, M.D.      ASSISTANT:  Lanney Gins, PA-C.      ANESTHESIA:  Spinal.      SPECIMENS:  None.      COMPLICATION:  None.      DRAINS:  None.  EBL: <50cc      TOURNIQUET TIME:   Total Tourniquet Time Documented: Thigh (Right) - 23 minutes Total: Thigh (Right) - 23 minutes     The patient was stable to the recovery room.      INDICATION FOR PROCEDURE:  [name] is a 81 y.o. female patient of   mine.  The patient had been seen, evaluated, and treated conservatively in the   office with medication, activity modification, and injections.  The patient had   radiographic changes of bone-on-bone arthritis with endplate sclerosis and osteophytes noted.      The patient failed conservative measures including medication, injections, and activity modification, and at this point was ready for more definitive measures.   Based on the radiographic changes and failed conservative measures, the patient   decided to proceed with total knee replacement.  Risks of infection,   DVT, component  failure, need for revision surgery, postop course, and   expectations were all   discussed and reviewed.  Consent was obtained for benefit of pain   relief.      PROCEDURE IN DETAIL:  The patient was brought to the operative theater.   Once adequate anesthesia, preoperative antibiotics, 2 gm of Ancef and  of Decadron administered, the patient was positioned supine with the right thigh tourniquet placed.  The  right lower extremity was prepped and draped in sterile fashion.  A time-   out was performed identifying the patient, planned procedure, and   extremity.      The right lower extremity was placed in [facility] leg holder.  The leg was   exsanguinated, tourniquet elevated to 250 mmHg.  A midline incision was   made followed by median parapatellar arthrotomy.  Following initial  exposure, attention was first directed to the patella.  Precut   measurement was noted to be 21 mm.  I resected down to 14 mm and used a   35 patellar button to restore patellar height as well as cover the cut   surface.      The lug holes were drilled and a metal shim was placed to protect the   patella from retractors and saw blades.      At this point, attention was now directed to the femur.  The femoral   canal was opened with a drill, irrigated to try to prevent fat emboli.  An   intramedullary rod was passed at 3 degrees valgus, 9 mm of bone was   resected off the distal femur.  Following this resection, the tibia was   subluxated anteriorly.  Using the extramedullary guide, 2 mm of bone was resected off   the proximal lateral tibia.  We confirmed the gap would be   stable medially and laterally with a size 5 mm insert as well as confirmed   the cut was perpendicular in the coronal plane, checking with an alignment rod.      Once this was done, I sized the femur to be a size 5 in the anterior-   posterior dimension, chose a narrow component based on medial and   lateral dimension.  The size 5  rotation block was then pinned in   position anterior referenced using the C-clamp to set rotation.  The   anterior, posterior, and  chamfer cuts were made without difficulty nor   notching making certain that I was along the anterior cortex to help   with flexion gap stability.      The final box cut was made off the lateral aspect of distal femur.      At this point, the tibia was sized to be a size 4, the size 4 tray was   then pinned in position through the medial third of the tubercle,   drilled, and keel punched.  Trial reduction was now carried with a 5 femur,  4 tibia, a size 6 mm PS insert, and the 35 patella botton.  The knee was brought to   extension, full extension with good flexion stability with the patella   tracking through the trochlea without application of pressure.  Given   all these findings, the trial components removed.  Final components were   opened and cement was mixed.  The knee was irrigated with normal saline   solution and pulse lavage.  The synovial lining was   then injected with 0.25% Marcaine with epinephrine and 1 cc of Toradol,   total of 61 cc.      The knee was irrigated.  Final implants were then cemented onto clean and   dried cut surfaces of bone with the knee brought to extension with a size 6 mm trial insert.      Once the cement had fully cured, the excess cement was removed   throughout the knee.  I confirmed I was satisfied with the range of   motion and stability, and the final size 6 mm PS AOX insert was chosen.  It was   placed into the knee.      The tourniquet had been let down at 23 minutes.  No significant   hemostasis required.  The   extensor mechanism was then reapproximated using #1 Vicryl with the knee   in flexion.  After  closure of the extensor mechanism I injected the joint with 2gm of topical based Tranexamic Acid.  The   remaining wound was closed with 2-0 Vicryl and running 4-0 Monocryl.   The knee was cleaned, dried,  dressed sterilely using Dermabond and   Aquacel dressing.  The patient was then   brought to recovery room in stable condition, tolerating the procedure   well.   Please note that Physician Assistant, Lanney Gins, PA-C, was present for the entirety of the case, and was utilized for pre-operative positioning, peri-operative retractor management, general facilitation of the procedure.  He was also utilized for primary wound closure at the end of the case.              Madlyn Frankel Charlann Boxer, M.D.    12/04/2014 1:28 PM

## 2014-12-04 NOTE — Progress Notes (Signed)
Utilization review completed.  

## 2014-12-04 NOTE — Transfer of Care (Signed)
Immediate Anesthesia Transfer of Care Note  Patient: [name]  Procedure(s) Performed: Procedure(s): RIGHT TOTAL  KNEE ARTHROPLASTY (Right)  Patient Location: PACU  Anesthesia Type:Spinal  Level of Consciousness: awake, alert , oriented, patient cooperative and responds to stimulation  Airway & Oxygen Therapy: Patient Spontanous Breathing and Patient connected to face mask oxygen  Post-op Assessment: Report given to RN, Post -op Vital signs reviewed and stable and Patient moving all extremities  Post vital signs: Reviewed and stable  Last Vitals:  Filed Vitals:   12/04/14 0839  BP: 152/73  Pulse: 79  Temp: 36.6 C  Resp: 18    Complications: No apparent anesthesia complications

## 2014-12-04 NOTE — Interval H&P Note (Signed)
History and Physical Interval Note:  12/04/2014 11:12 AM  [name]  has presented today for surgery, with the diagnosis of RIGHT KNEE OA   The various methods of treatment have been discussed with the patient and family. After consideration of risks, benefits and other options for treatment, the patient has consented to  Procedure(s): TOTAL RIGHT KNEE ARTHROPLASTY (Right) as a surgical intervention .  The patient's history has been reviewed, patient examined, no change in status, stable for surgery.  I have reviewed the patient's chart and labs.  Questions were answered to the patient's satisfaction.     [name]

## 2014-12-04 NOTE — Anesthesia Preprocedure Evaluation (Addendum)
Anesthesia Evaluation  Patient identified by MRN, date of birth, ID band Patient awake    Reviewed: Allergy & Precautions, NPO status , Patient's Chart, lab work & pertinent test results  History of Anesthesia Complications (+) PONV and history of anesthetic complications  Airway Mallampati: II  TM Distance: >3 FB Neck ROM: Full    Dental  (+) Missing, Chipped, Dental Advisory Given   Pulmonary sleep apnea (O2 at night) and Oxygen sleep apnea ,  breath sounds clear to auscultation        Cardiovascular hypertension, Pt. on medications and Pt. on home beta blockers - angina+ dysrhythmias Atrial Fibrillation + pacemaker + Valvular Problems/Murmurs (now S/P TAVR) AS Rhythm:Irregular Rate:Normal  '15 ECHO: EF 50-55%, prosthetic aortic valve functioning well, trivial AI   Neuro/Psych  Headaches,    GI/Hepatic Neg liver ROS, GERD-  Medicated and Controlled,  Endo/Other  Hypothyroidism Morbid obesity  Renal/GU Renal InsufficiencyRenal disease (creat 2.27)     Musculoskeletal  (+) Arthritis -, Osteoarthritis,    Abdominal (+) + obese,   Peds  Hematology  (+) Blood dyscrasia (INR 1.16, Hb 11.2), ,   Anesthesia Other Findings   Reproductive/Obstetrics                         Anesthesia Physical Anesthesia Plan  ASA: III  Anesthesia Plan: Spinal   Post-op Pain Management:    Induction:   Airway Management Planned: Natural Airway and Simple Face Mask  Additional Equipment:   Intra-op Plan:   Post-operative Plan:   Informed Consent: I have reviewed the patients History and Physical, chart, labs and discussed the procedure including the risks, benefits and alternatives for the proposed anesthesia with the patient or authorized representative who has indicated his/her understanding and acceptance.   Dental advisory given  Plan Discussed with: CRNA and Surgeon  Anesthesia Plan Comments: (Plan  routine monitors, SAB)        Anesthesia Quick Evaluation

## 2014-12-05 ENCOUNTER — Encounter (HOSPITAL_COMMUNITY): Payer: Self-pay | Admitting: Orthopedic Surgery

## 2014-12-05 LAB — CBC
HCT: 27.8 % — ABNORMAL LOW (ref 36.0–46.0)
Hemoglobin: 9.3 g/dL — ABNORMAL LOW (ref 12.0–15.0)
MCH: 31.5 pg (ref 26.0–34.0)
MCHC: 33.5 g/dL (ref 30.0–36.0)
MCV: 94.2 fL (ref 78.0–100.0)
PLATELETS: UNDETERMINED 10*3/uL (ref 150–400)
RBC: 2.95 MIL/uL — ABNORMAL LOW (ref 3.87–5.11)
RDW: 14.4 % (ref 11.5–15.5)
WBC: 10.3 10*3/uL (ref 4.0–10.5)

## 2014-12-05 LAB — BASIC METABOLIC PANEL
Anion gap: 10 (ref 5–15)
BUN: 66 mg/dL — AB (ref 6–20)
CALCIUM: 8 mg/dL — AB (ref 8.9–10.3)
CO2: 26 mmol/L (ref 22–32)
CREATININE: 3.16 mg/dL — AB (ref 0.44–1.00)
Chloride: 102 mmol/L (ref 101–111)
GFR calc non Af Amer: 13 mL/min — ABNORMAL LOW (ref >60)
GFR, EST AFRICAN AMERICAN: 15 mL/min — AB (ref >60)
Glucose, Bld: 121 mg/dL — ABNORMAL HIGH (ref 65–99)
Potassium: 5.1 mmol/L (ref 3.5–5.1)
SODIUM: 138 mmol/L (ref 135–145)

## 2014-12-05 LAB — PROTIME-INR
INR: 1.13 (ref 0.00–1.49)
PROTHROMBIN TIME: 14.7 s (ref 11.6–15.2)

## 2014-12-05 MED ORDER — ENOXAPARIN SODIUM 30 MG/0.3ML ~~LOC~~ SOLN
30.0000 mg | SUBCUTANEOUS | Status: DC
  Administered 2014-12-06 – 2014-12-07 (×2): 30 mg via SUBCUTANEOUS
  Filled 2014-12-05 (×3): qty 0.3

## 2014-12-05 MED ORDER — WARFARIN SODIUM 3 MG PO TABS
3.0000 mg | ORAL_TABLET | Freq: Once | ORAL | Status: AC
  Administered 2014-12-05: 3 mg via ORAL
  Filled 2014-12-05: qty 1

## 2014-12-05 MED ORDER — PREGABALIN 75 MG PO CAPS
75.0000 mg | ORAL_CAPSULE | Freq: Every day | ORAL | Status: DC
  Administered 2014-12-06 – 2014-12-07 (×2): 75 mg via ORAL
  Filled 2014-12-05 (×2): qty 1

## 2014-12-05 MED ORDER — MEMANTINE HCL 5 MG PO TABS
5.0000 mg | ORAL_TABLET | Freq: Two times a day (BID) | ORAL | Status: DC
  Administered 2014-12-05 – 2014-12-07 (×4): 5 mg via ORAL
  Filled 2014-12-05 (×5): qty 1

## 2014-12-05 NOTE — Progress Notes (Signed)
CSW assisting with d/c planning. Pt has choosen [employer] for [employer]. Pt 's son, Shekira Kratzer  ( [phone] ) has been contacted at pt's request and updated. Pt's son is in agreement with this plan.  Cori Razor LCSW [phone]

## 2014-12-05 NOTE — Progress Notes (Signed)
ANTICOAGULATION CONSULT NOTE - Follow up  Pharmacy Consult for warfarin Indication: VTE prophylaxis  Allergies  Allergen Reactions  . Codeine Hives  . Darvocet [Propoxyphene N-Acetaminophen] Hives  . Nubain [Nalbuphine Hcl] Hives    Went into cardiac arrest     Patient Measurements: Height: 5' (152.4 cm) Weight: 169 lb (76.658 kg) IBW/kg (Calculated) : 45.5  Vital Signs: Temp: 98.4 F (36.9 C) (08/16 1003) Temp Source: Oral (08/16 1003) BP: 127/43 mmHg (08/16 1003) Pulse Rate: 65 (08/16 1003)  Labs:  Recent Labs  12/04/14 0915 12/04/14 2243 12/05/14 0800  HGB  --  9.5* 9.3*  HCT  --  29.4* 27.8*  PLT  --  167 PLATELET CLUMPS NOTED ON SMEAR, UNABLE TO ESTIMATE  LABPROT 15.0  --  14.7  INR 1.16  --  1.13  CREATININE  --  3.12* 3.16*    Estimated Creatinine Clearance: 13 mL/min (by C-G formula based on Cr of 3.16).   Medical History: Past Medical History  Diagnosis Date  . Syncope   . Symptomatic bradycardia   . Chronic lower back pain   . Nonischemic cardiomyopathy   . HTN (hypertension)   . HLD (hyperlipidemia)   . Atrial fibrillation     tachy-brady syndrome with <1% recurrent PAF since pacemaker placement  . H/O: stroke   . Pulmonary embolism     HISTORY OF, the pt. had a recurrent bilateral pulmonary emboli in 2005, on warfarin therapy and at which time she under went implantation of IVC filter  . Anemia   . Dementia   . Gastroesophageal reflux disease   . Aortic stenosis 10/13/2012    Low EF, low gradient with severe aortic stenosis confirmed by dobutamine stress echocardiogram s/p TAVR 12/2012  . PONV (postoperative nausea and vomiting)   . Osteoarthritis   . Neuropathy   . Spinal stenosis   . Symptomatic bradycardia 2012    s/p Medtr[employer] PPM  . Chronic combined systolic and diastolic CHF (congestive heart failure)   . Fibromyalgia   . Presence of permanent cardiac pacemaker   . Sleep apnea     uses oxygen at night and PRN- not used since > 6  months   . Shortness of breath dyspnea     with exertion   . Pneumonia     hx of x 3   . Acute on chronic renal failure     sees Dr Allena Katz   . CKD (chronic kidney disease)   . Headache     hx of migraines   . On home oxygen therapy     patient uses at nite- 2L- has not used in > 6 months per patient    Medications:  Prescriptions prior to admission  Medication Sig Dispense Refill Last Dose  . alendronate (FOSAMAX) 70 MG tablet Take 70 mg by mouth once a week. Saturday.   12/02/2014  . amiodarone (PACERONE) 200 MG tablet Take 0.5 tablets (100 mg total) by mouth daily. (Patient taking differently: Take 100 mg by mouth every morning. )   12/04/2014 at 0750  . aspirin EC 81 MG EC tablet Take 1 tablet (81 mg total) by mouth daily. (Patient taking differently: Take 81 mg by mouth at bedtime. )   12/03/2014 at Unknown time  . BIOTIN 5000 PO Take 5,000 mcg by mouth every evening.    12/03/2014 at Unknown time  . calcitRIOL (ROCALTROL) 0.25 MCG capsule Take 0.25 mcg by mouth every morning.    12/03/2014 at Unknown time  .  carvedilol (COREG) 12.5 MG tablet Take 1 tablet by mouth 2 (two) times daily.  3 12/04/2014 at 0750  . cetirizine (ZYRTEC) 10 MG tablet Take 10 mg by mouth every morning.    12/04/2014 at 0750  . Cholecalciferol (VITAMIN D) 1000 UNITS capsule Take 2,000 Units by mouth at bedtime.    12/03/2014 at Unknown time  . colchicine 0.6 MG tablet Take 0.6 mg by mouth every other day. PM   12/02/2014 at Unknown time  . cycloSPORINE (RESTASIS) 0.05 % ophthalmic emulsion Place 1 drop into both eyes 2 (two) times daily as needed (dry eyes.).   Past Week at Unknown time  . docusate sodium (COLACE) 50 MG capsule Take 50 mg by mouth 2 (two) times daily.    12/03/2014 at Unknown time  . donepezil (ARICEPT) 10 MG tablet Take 10 mg by mouth at bedtime.    12/03/2014 at Unknown time  . DULoxetine (CYMBALTA) 60 MG capsule Take 60 mg by mouth every evening.    12/03/2014 at Unknown time  . enoxaparin (LOVENOX) 40  MG/0.4ML injection Inject 40 mg into the skin daily. Starts on 11/30/14   Filled at [facility]  1 12/03/2014 at PM  . esomeprazole (NEXIUM) 40 MG capsule Take 40 mg by mouth daily before breakfast.     12/04/2014 at 0750  . hydrALAZINE (APRESOLINE) 10 MG tablet Take 10 mg by mouth 3 (three) times daily.    12/04/2014 at 0750  . HYDROcodone-acetaminophen (NORCO) 10-325 MG per tablet Take 1-1.5 tablets by mouth 3 (three) times daily. Takes one tablet in the morning and mid day and then one and a half tablet at night.   12/04/2014 at 0750  . levothyroxine (SYNTHROID, LEVOTHROID) 100 MCG tablet Take 100 mcg by mouth every morning.    12/04/2014 at 0750  . Linaclotide (LINZESS) 145 MCG CAPS capsule Take 145 mcg by mouth every other day. Alternates with the 290 mg tablet in the am.   12/02/2014 at Unknown time  . Linaclotide 290 MCG CAPS Take 290 mcg by mouth every other day. Alternates with the 145 mg tablets in the morning.   12/03/2014 at Unknown time  . memantine (NAMENDA) 10 MG tablet Take 10 mg by mouth 2 (two) times daily.    12/04/2014 at 0750  . pravastatin (PRAVACHOL) 40 MG tablet Take 40 mg by mouth every evening.    12/03/2014 at Unknown time  . pregabalin (LYRICA) 75 MG capsule Take 75 mg by mouth 2 (two) times daily.    12/04/2014 at 0750  . torsemide (DEMADEX) 20 MG tablet Take 20 mg by mouth 2 (two) times daily. PEr Dr Allena Katz - patient to not take on 8/14 nor am of 8/15   12/02/2014  . Turmeric 500 MG CAPS Take 1 capsule by mouth 2 (two) times daily.   Past Week at Unknown time  . ULORIC 40 MG tablet Take 40 mg by mouth every evening.    12/03/2014 at Unknown time  . vitamin B-12 (CYANOCOBALAMIN) 1000 MCG tablet Take 1,000 mcg by mouth 2 (two) times a week. Tuesday and Friday   12/01/2014  . warfarin (COUMADIN) 2.5 MG tablet Take 2.5 mg by mouth daily.   11/27/2014  . Multiple Vitamin (MULTIVITAMIN) tablet Take 1 tablet by mouth every morning.    More than a month at Unknown time  .  nitroGLYCERIN (NITROSTAT) 0.4 MG SL tablet Place 0.4 mg under the tongue every 5 (five) minutes as needed for chest  pain (MAX 3 TABLETS).    More than a month at Unknown time  . NON FORMULARY 2 Liters oxygen as needed for shortness of breath   More than a month at Unknown time   Scheduled:  . amiodarone  100 mg Oral q morning - 10a  . aspirin EC  81 mg Oral QHS  . carvedilol  12.5 mg Oral BID  . celecoxib  200 mg Oral Q12H  . colchicine  0.6 mg Oral QODAY  . docusate sodium  100 mg Oral BID  . donepezil  10 mg Oral QHS  . DULoxetine  60 mg Oral QPM  . enoxaparin (LOVENOX) injection  40 mg Subcutaneous Q24H  . esomeprazole  40 mg Oral QAC breakfast  . febuxostat  40 mg Oral QPM  . ferrous sulfate  325 mg Oral TID PC  . hydrALAZINE  10 mg Oral TID  . HYDROcodone-acetaminophen  1-2 tablet Oral 6 times per day  . levothyroxine  100 mcg Oral QAC breakfast  . Linaclotide  145 mcg Oral QODAY  . Linaclotide  290 mcg Oral QODAY  . loratadine  10 mg Oral Daily  . memantine  10 mg Oral BID  . polyethylene glycol  17 g Oral BID  . pravastatin  40 mg Oral QPM  . pregabalin  75 mg Oral BID  . torsemide  20 mg Oral BID  . Warfarin - Pharmacist Dosing Inpatient   Does not apply q1800    Assessment:  44 yoF s/p R TKA; on warfarin PTA for AFib and hx of PE. Pharmacy to resume warfarin while admitted; to continue on Lovenox until INR >/= 1.8. Home warfarin regimen reported as 2.5 mg daily.  Today, 12/05/2014:  INR subtherapeutic, not rising yet after 3mg  x 1.  H/H ok. Platelets were clumped.  SCr is elevated, CrCl ~70ml/min.  Major drug interactions: amiodarone, celecoxib, aspirin 81mg  (both PTA meds along with warfarin)  No bleeding issues per nursing  Diet: regular  Goal of Therapy: INR 2-3  Plan:  Warfarin 3mg  PO tonight  Daily INR  CBC at least q72 hr while on warfarin  Monitor for signs of bleeding or thrombosis  Recommend reducing Lovenox to 30mg  SQ q24h dut to CrCl  <30.   Charolotte Eke, PharmD, pager [phone]. 12/05/2014,1:17 PM.

## 2014-12-05 NOTE — Care Management Note (Signed)
Case Management Note  Patient Details  Name: [name] MRN: [id number] Date of Birth: 09-Aug-1932  Subjective/Objective:                   RIGHT TOTAL KNEE ARTHROPLASTY (Right) Action/Plan:  Discharge planning Expected Discharge Date:  12/06/14               Expected Discharge Plan:  Skilled Nursing Facility  In-House Referral:     Discharge planning Services  CM Consult  Post Acute Care Choice:    Choice offered to:  Patient  DME Arranged:    DME Agency:     HH Arranged:    HH Agency:     Status of Service:  Completed, signed off  Medicare Important Message Given:  Yes-second notification given Date Medicare IM Given:    Medicare IM give by:    Date Additional Medicare IM Given:    Additional Medicare Important Message give by:     If discussed at Long Length of Stay Meetings, dates discussed:    Additional Comments: CM notes pt to go to SNF for rehab; CSW arranging.  No other CM needs were communicated. Yves Dill, RN 12/05/2014, 3:24 PM

## 2014-12-05 NOTE — Evaluation (Signed)
Physical Therapy Evaluation Patient Details Name: [name] MRN: [id number] DOB: [birth year] Today's Date: 12/05/2014   History of Present Illness  s/p R TKA  Clinical Impression  Pt s/p R TKR presents with decreased R LE strength/ROM, ltd L knee ROM, bilat peripheral neuropathy and post op pain limiting functional mobility.   Pt would benefit from follow up rehab at SNF level to maximize IND and safety prior to return home with ltd assist.    Follow Up Recommendations SNF    Equipment Recommendations  None recommended by PT    Recommendations for Other Services OT consult     Precautions / Restrictions Precautions Precautions: Fall Required Braces or Orthoses: Knee Immobilizer - Right Knee Immobilizer - Right: Discontinue once straight leg raise with < 10 degree lag Restrictions Weight Bearing Restrictions: No Other Position/Activity Restrictions: WBAT      Mobility  Bed Mobility Overal bed mobility: Needs Assistance;+2 for physical assistance;+ 2 for safety/equipment Bed Mobility: Supine to Sit     Supine to sit: Mod assist;+2 for safety/equipment;+2 for physical assistance     General bed mobility comments: assist for RLE and  trunk  Transfers Overall transfer level: Needs assistance Equipment used: Rolling walker (2 wheeled) Transfers: Sit to/from Stand Sit to Stand: Min assist;+2 safety/equipment         General transfer comment: assist to rise and steady. Cues for UE/LE Placement  Ambulation/Gait                Stairs            Wheelchair Mobility    Modified Rankin (Stroke Patients Only)       Balance                                             Pertinent Vitals/Pain Pain Assessment: 0-10 Pain Score: 5  Pain Location: R knee Pain Descriptors / Indicators: Aching Pain Intervention(s): Limited activity within patient's tolerance;Monitored during session;Premedicated before session;Repositioned;Ice applied     Home Living Family/patient expects to be discharged to:: Skilled nursing facility Living Arrangements: Children                    Prior Function Level of Independence: Independent with assistive device(s) (extra time)               Hand Dominance        Extremity/Trunk Assessment   Upper Extremity Assessment: Generalized weakness (grossly 3 to 3+/5)           Lower Extremity Assessment: RLE deficits/detail;LLE deficits/detail RLE Deficits / Details: 2+/5 quads with AAROM at knee -10 - 25; Ltd DF at ankle  LLE Deficits / Details: 4/5 strength with ROM at knee ltd to 10 - 50.  Ltd DF at ankle with pt unable to achieve neutral  Cervical / Trunk Assessment: Kyphotic  Communication   Communication: No difficulties  Cognition Arousal/Alertness: Awake/alert Behavior During Therapy: WFL for tasks assessed/performed Overall Cognitive Status: No family/caregiver present to determine baseline cognitive functioning Area of Impairment: Safety/judgement         Safety/Judgement: Decreased awareness of safety     General Comments: HOH;     General Comments      Exercises Total Joint Exercises Ankle Circles/Pumps: AAROM;Both;15 reps;Supine Quad Sets: AAROM;Both;10 reps;Supine Heel Slides: AAROM;Right;10 reps;Supine Straight Leg Raises: AAROM;Right;10 reps;Supine  Assessment/Plan    PT Assessment Patient needs continued PT services  PT Diagnosis Difficulty walking   PT Problem List Decreased strength;Decreased range of motion;Decreased activity tolerance;Decreased mobility;Decreased knowledge of use of DME;Obesity;Pain;Decreased knowledge of precautions  PT Treatment Interventions DME instruction;Gait training;Functional mobility training;Therapeutic activities;Therapeutic exercise;Patient/family education   PT Goals (Current goals can be found in the Care Plan section) Acute Rehab PT Goals Patient Stated Goal: get back to being independent PT Goal  Formulation: With patient Time For Goal Achievement: 12/09/14 Potential to Achieve Goals: Good    Frequency 7X/week   Barriers to discharge        Co-evaluation   Reason for Co-Treatment: For patient/therapist safety PT goals addressed during session: Mobility/safety with mobility OT goals addressed during session: ADL's and self-care       End of Session   Activity Tolerance: Patient limited by fatigue;Patient limited by pain Patient left: in bed;with call bell/phone within reach Nurse Communication: Mobility status         Time: 0926-0950 PT Time Calculation (min) (ACUTE ONLY): 24 min   Charges:   PT Evaluation $Initial PT Evaluation Tier I: 1 Procedure PT Treatments $Therapeutic Exercise: 8-22 mins   PT G Codes:        Jennica Tagliaferri 12-22-14, 12:52 PM

## 2014-12-05 NOTE — Progress Notes (Signed)
Patient ID: [name], female   DOB: 04/17/1933, 81 y.o.   MRN: [id number] Subjective: 1 Day Post-Op Procedure(s) (LRB): RIGHT TOTAL  KNEE ARTHROPLASTY (Right)    Patient reports pain as mild.  Up eating breakfast this am.  No events last night.  Wants to got to rehab prior to home due to lack of help at home  Objective:   VITALS:   Filed Vitals:   12/05/14 1333  BP: 114/56  Pulse: 64  Temp: 98.3 F (36.8 C)  Resp: 18    Neurovascular intact Incision: dressing C/D/I  LABS  Recent Labs  12/04/14 2243 12/05/14 0800  HGB 9.5* 9.3*  HCT 29.4* 27.8*  WBC 9.3 10.3  PLT 167 PLATELET CLUMPS NOTED ON SMEAR, UNABLE TO ESTIMATE     Recent Labs  12/04/14 2243 12/05/14 0800  NA  --  138  K  --  5.1  BUN  --  66*  CREATININE 3.12* 3.16*  GLUCOSE  --  121*     Recent Labs  12/04/14 0915 12/05/14 0800  INR 1.16 1.13     Assessment/Plan: 1 Day Post-Op Procedure(s) (LRB): RIGHT TOTAL  KNEE ARTHROPLASTY (Right)   Advance diet Up with therapy Discharge to SNF tomorrow, Fl-2 signed  Chronic renal insufficiency, meds adjusted accordingly thanks to pharmacy

## 2014-12-05 NOTE — Progress Notes (Signed)
Physical Therapy Treatment Patient Details Name: [name] MRN: [id number] DOB: 23-May-1932 Today's Date: 12/05/2014    History of Present Illness s/p R TKA    PT Comments    Pt progressing slowly with mobility and ltd by generalized weakness/deconditioning and pain.  Follow Up Recommendations  SNF     Equipment Recommendations  None recommended by PT    Recommendations for Other Services OT consult     Precautions / Restrictions Precautions Precautions: Fall Required Braces or Orthoses: Knee Immobilizer - Right Knee Immobilizer - Right: Discontinue once straight leg raise with < 10 degree lag Restrictions Weight Bearing Restrictions: No Other Position/Activity Restrictions: WBAT    Mobility  Bed Mobility Overal bed mobility: Needs Assistance;+2 for physical assistance;+ 2 for safety/equipment Bed Mobility: Sit to Supine     Supine to sit: Mod assist;+2 for safety/equipment;+2 for physical assistance Sit to supine: Mod assist   General bed mobility comments: assist for RLE and  trunk  Transfers Overall transfer level: Needs assistance Equipment used: Rolling walker (2 wheeled) Transfers: Sit to/from Stand Sit to Stand: Min assist;Mod assist;+2 safety/equipment;+2 physical assistance Stand pivot transfers: Min assist;Mod assist;+2 safety/equipment       General transfer comment: assist to rise and steady. Cues for UE/LE Placement.    Ambulation/Gait Ambulation/Gait assistance: Min assist;Mod assist;+2 safety/equipment Ambulation Distance (Feet): 26 Feet Assistive device: Rolling walker (2 wheeled) Gait Pattern/deviations: Step-to pattern;Decreased step length - right;Decreased step length - left;Shuffle;Trunk flexed Gait velocity: decr   General Gait Details: cues for sequence, saftey awareness, posture and position from [employer]            Wheelchair Mobility    Modified Rankin (Stroke Patients Only)       Balance                                     Cognition Arousal/Alertness: Awake/alert Behavior During Therapy: WFL for tasks assessed/performed Overall Cognitive Status: No family/caregiver present to determine baseline cognitive functioning Area of Impairment: Safety/judgement         Safety/Judgement: Decreased awareness of safety     General Comments: HOH;     Exercises      General Comments        Pertinent Vitals/Pain Pain Assessment: 0-10 Pain Score: 5  Pain Location: R knee Pain Descriptors / Indicators: Aching;Sore Pain Intervention(s): Limited activity within patient's tolerance;Monitored during session;Premedicated before session;Ice applied    Home Living Family/patient expects to be discharged to:: Skilled nursing facility Living Arrangements: Children                  Prior Function Level of Independence: Independent with assistive device(s) (extra time)          PT Goals (current goals can now be found in the care plan section) Acute Rehab PT Goals Patient Stated Goal: get back to being independent PT Goal Formulation: With patient Time For Goal Achievement: 12/09/14 Potential to Achieve Goals: Good Progress towards PT goals: Progressing toward goals    Frequency  7X/week    PT Plan Current plan remains appropriate    Co-evaluation   Reason for Co-Treatment: For patient/therapist safety PT goals addressed during session: Mobility/safety with mobility OT goals addressed during session: ADL's and self-care     End of Session Equipment Utilized During Treatment: Gait belt;Right knee immobilizer Activity Tolerance: Patient limited by fatigue;Patient limited by pain Patient  left: in bed;with call bell/phone within reach     Time: 1405-1505 PT Time Calculation (min) (ACUTE ONLY): 60 min  Charges:  $Gait Training: 8-22 mins $Therapeutic Activity: 8-22 mins                    G Codes:      Ninfa Giannelli December 17, 2014, 3:44 PM
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
## 2014-12-05 NOTE — Evaluation (Signed)
Occupational Therapy Evaluation Patient Details Name: [name] MRN: [id number] DOB: 01-Jul-1932 Today's Date: 12/05/2014    History of Present Illness s/p R TKA   Clinical Impression   This 81 year old female was admitted for the above surgery.  She was mod I with adls prior to admission, but she reports that ADLs took a very long time. She will benefit from skilled OT in acute and follow up in SNF to increase safety and independence with these.  Currently, pt needs +2 for safety and goals in acute are for min guard to min A overall.    Follow Up Recommendations  SNF    Equipment Recommendations  3 in 1 bedside comode    Recommendations for Other Services       Precautions / Restrictions Precautions Precautions: Fall Restrictions Weight Bearing Restrictions: No      Mobility Bed Mobility Overal bed mobility: Needs Assistance;+2 for physical assistance;+ 2 for safety/equipment Bed Mobility: Supine to Sit     Supine to sit: Mod assist;+2 for safety/equipment;+2 for physical assistance     General bed mobility comments: assist for RLE and  trunk  Transfers Overall transfer level: Needs assistance Equipment used: Rolling walker (2 wheeled) Transfers: Sit to/from Stand Sit to Stand: Min assist;+2 safety/equipment         General transfer comment: assist to rise and steady. Cues for UE/LE Placement    Balance                                            ADL Overall ADL's : Needs assistance/impaired     Grooming: Set up;Supervision/safety;Sitting   Upper Body Bathing: Supervision/ safety;Set up;Sitting   Lower Body Bathing: Moderate assistance;+2 for safety/equipment;Sit to/from stand   Upper Body Dressing : Minimal assistance;Sitting (lines)   Lower Body Dressing: Moderate assistance;+2 for safety/equipment;Sit to/from stand   Toilet Transfer: Minimal assistance;+2 for safety/equipment;Ambulation             General ADL  Comments: Pt unpredictable; +2 for safety.  Leaned forward on walker to alleviate back pressure.  Leaned posteriorly when getting OOB.  Pt giggled was playful during session; cues given for safety     Vision     Perception     Praxis      Pertinent Vitals/Pain Pain Assessment: 0-10 Pain Score: 5  Pain Location: R knee Pain Descriptors / Indicators: Aching Pain Intervention(s): Limited activity within patient's tolerance;Monitored during session;Premedicated before session;Repositioned;Ice applied     Hand Dominance     Extremity/Trunk Assessment Upper Extremity Assessment Upper Extremity Assessment: Generalized weakness (grossly 3 to 3+/5)           Communication Communication Communication: No difficulties   Cognition Arousal/Alertness: Awake/alert Behavior During Therapy: WFL for tasks assessed/performed Overall Cognitive Status: No family/caregiver present to determine baseline cognitive functioning Area of Impairment: Safety/judgement         Safety/Judgement: Decreased awareness of safety     General Comments: HOH;    General Comments       Exercises       Shoulder Instructions      Home Living Family/patient expects to be discharged to:: Skilled nursing facility Living Arrangements: Children  Prior Functioning/Environment Level of Independence: Independent with assistive device(s) (extra time)             OT Diagnosis: Generalized weakness   OT Problem List: Decreased strength;Decreased activity tolerance;Decreased knowledge of use of DME or AE;Pain;Decreased knowledge of precautions;Decreased safety awareness   OT Treatment/Interventions: Self-care/ADL training;DME and/or AE instruction;Patient/family education    OT Goals(Current goals can be found in the care plan section) Acute Rehab OT Goals Patient Stated Goal: get back to being independent OT Goal Formulation: With patient Time  For Goal Achievement: 12/12/14 Potential to Achieve Goals: Good ADL Goals Pt Will Perform Grooming: with min guard assist;standing Pt Will Perform Lower Body Bathing: with min guard assist;sit to/from stand;with adaptive equipment Pt Will Transfer to Toilet: stand pivot transfer;bedside commode;with min guard assist Pt Will Perform Toileting - Clothing Manipulation and hygiene: with min guard assist;sit to/from stand Additional ADL Goal #1: Pt will not need safety cues during session (other than for sequencing)  OT Frequency: Min 2X/week   Barriers to D/C:            Co-evaluation PT/OT/SLP Co-Evaluation/Treatment: Yes Reason for Co-Treatment: For patient/therapist safety PT goals addressed during session: Mobility/safety with mobility OT goals addressed during session: ADL's and self-care      End of Session    Activity Tolerance: Patient tolerated treatment well Patient left: in chair;with call bell/phone within reach   Time: 3244-0102 OT Time Calculation (min): 24 min Charges:  OT General Charges $OT Visit: 1 Procedure OT Evaluation $Initial OT Evaluation Tier I: 1 Procedure G-Codes:    Sophi Calligan December 15, 2014, 12:27 PM  Marica Otter, OTR/L [phone] Dec 15, 2014

## 2014-12-05 NOTE — Clinical Social Work Placement (Signed)
   CLINICAL SOCIAL WORK PLACEMENT  NOTE  Date:  12/05/2014  Patient Details  Name: [name] MRN: [id number] Date of Birth: 01-16-1933  Clinical Social Work is seeking post-discharge placement for this patient at the Skilled  Nursing Facility level of care (*CSW will initial, date and re-position this form in  chart as items are completed):  No   Patient/family provided with [facility] Clinical Social Work Department's list of facilities offering this level of care within the geographic area requested by the patient (or if unable, by the patient's family).  No   Patient/family informed of their freedom to choose among providers that offer the needed level of care, that participate in Medicare, Medicaid or managed care program needed by the patient, have an available bed and are willing to accept the patient.  No   Patient/family informed of [facility] ownership interest in [facility] and [facility], as well as of the fact that they are under no obligation to receive care at these facilities.  PASRR submitted to EDS on 12/04/14     PASRR number received on 12/04/14     Existing PASRR number confirmed on       FL2 transmitted to all facilities in geographic area requested by pt/family on 12/05/14     FL2 transmitted to all facilities within larger geographic area on       Patient informed that his/her managed care company has contracts with or will negotiate with certain facilities, including the following:            Patient/family informed of bed offers received.  Patient chooses bed at       Physician recommends and patient chooses bed at      Patient to be transferred to   on  .  Patient to be transferred to facility by       Patient family notified on   of transfer.  Name of family member notified:        PHYSICIAN       Additional Comment:    _______________________________________________ Royetta Asal, LCSW  [phone] 12/05/2014,  10:11 AM

## 2014-12-05 NOTE — Progress Notes (Signed)
Physical Therapy Treatment Patient Details Name: [name] MRN: [id number] DOB: Nov 17, 1932 Today's Date: 12/05/2014    History of Present Illness s/p R TKA    PT Comments    Pt cooperative but progressing slowly with mobility.  Follow Up Recommendations  SNF     Equipment Recommendations  None recommended by PT    Recommendations for Other Services OT consult     Precautions / Restrictions Precautions Precautions: Fall Required Braces or Orthoses: Knee Immobilizer - Right Knee Immobilizer - Right: Discontinue once straight leg raise with < 10 degree lag Restrictions Weight Bearing Restrictions: No Other Position/Activity Restrictions: WBAT    Mobility  Bed Mobility Overal bed mobility: Needs Assistance;+2 for physical assistance;+ 2 for safety/equipment Bed Mobility: Supine to Sit     Supine to sit: Mod assist;+2 for safety/equipment;+2 for physical assistance     General bed mobility comments: assist for RLE and  trunk  Transfers Overall transfer level: Needs assistance Equipment used: Rolling walker (2 wheeled) Transfers: Sit to/from Stand Sit to Stand: Min assist;+2 safety/equipment         General transfer comment: assist to rise and steady. Cues for UE/LE Placement  Ambulation/Gait Ambulation/Gait assistance: Min assist;Mod assist;+2 safety/equipment;+2 physical assistance Ambulation Distance (Feet): 13 Feet Assistive device: Rolling walker (2 wheeled) Gait Pattern/deviations: Step-to pattern;Decreased step length - right;Decreased step length - left;Shuffle;Trunk flexed Gait velocity: decr   General Gait Details: cues for sequence, saftey awareness, posture and position from [employer]            Wheelchair Mobility    Modified Rankin (Stroke Patients Only)       Balance                                    Cognition Arousal/Alertness: Awake/alert Behavior During Therapy: WFL for tasks  assessed/performed Overall Cognitive Status: No family/caregiver present to determine baseline cognitive functioning Area of Impairment: Safety/judgement         Safety/Judgement: Decreased awareness of safety     General Comments: HOH;     Exercises Total Joint Exercises Ankle Circles/Pumps: AAROM;Both;15 reps;Supine Quad Sets: AAROM;Both;10 reps;Supine Heel Slides: AAROM;Right;10 reps;Supine Straight Leg Raises: AAROM;Right;10 reps;Supine    General Comments        Pertinent Vitals/Pain Pain Assessment: 0-10 Pain Score: 5  Pain Location: R knee Pain Descriptors / Indicators: Aching Pain Intervention(s): Limited activity within patient's tolerance;Monitored during session;Premedicated before session;Repositioned;Ice applied    Home Living Family/patient expects to be discharged to:: Skilled nursing facility Living Arrangements: Children                  Prior Function Level of Independence: Independent with assistive device(s) (extra time)          PT Goals (current goals can now be found in the care plan section) Acute Rehab PT Goals Patient Stated Goal: get back to being independent PT Goal Formulation: With patient Time For Goal Achievement: 12/09/14 Potential to Achieve Goals: Good Progress towards PT goals: Progressing toward goals    Frequency  7X/week    PT Plan Current plan remains appropriate    Co-evaluation PT/OT/SLP Co-Evaluation/Treatment: Yes Reason for Co-Treatment: For patient/therapist safety PT goals addressed during session: Mobility/safety with mobility OT goals addressed during session: ADL's and self-care     End of Session Equipment Utilized During Treatment: Gait belt;Right knee immobilizer Activity Tolerance: Patient limited by fatigue;Patient limited  by pain Patient left: in chair;with call bell/phone within reach     Time: 4383-8184 PT Time Calculation (min) (ACUTE ONLY): 24 min  Charges:  $Gait Training: 8-22  mins $Therapeutic Exercise: 8-22 mins                    G Codes:      Cynde Menard 2014-12-25, 12:58 PM

## 2014-12-05 NOTE — Clinical Social Work Note (Signed)
Clinical Social Work Assessment  Patient Details  Name: [name] MRN: [id number] Date of Birth: [birth year]  Date of referral:  12/05/14               Reason for consult:  Facility Placement, Discharge Planning                Permission sought to share information with:  Chartered certified accountant granted to share information::  Yes, Verbal Permission Granted  Name::        Agency::     Relationship::     Contact Information:     Housing/Transportation Living arrangements for the past 2 months:  Single Family Home Source of Information:  Patient Patient Interpreter Needed:  None Criminal Activity/Legal Involvement Pertinent to Current Situation/Hospitalization:  No - Comment as needed Significant Relationships:  Adult Children Lives with:    Do you feel safe going back to the place where you live?   (ST Rehab is needed.) Need for family participation in patient care:  Yes (Comment)  Care giving concerns:  Pt's care cannot be managed at home following hospital d/c.    Social Worker assessment / plan:  Pt hospitalized on 12/04/14 for pre planned right total knee arthroplasty.  CSW met with pt to assist with d/c planning. Pt reports that ST Rehab will be needed prior to her returning home. PT Eval is pending. CSW has initiated ST rehab search in [place]. Bed offers are pending. CSW has permission from pt to contact her adult children to include them in d/c planning. Employment status:  Retired Nurse, adult PT Recommendations:  Not assessed at this time Information / Referral to community resources:  [place]  Patient/Family's Response to care:  Pt feels ST Rehab is needed at d/c.   Patient/Family's Understanding of and Emotional Response to Diagnosis, Current Treatment, and Prognosis: Pt is aware she had hip surgery and feels she will need rehab before she returns home. Pt is disappointed she is unable to have  her rehab at [facility]. Pt is not part of the [facility] community and would like to use her insurance to help cover her placement. Pt is eager to see what her rehab options will be.    Emotional Assessment Appearance:  Appears stated age Attitude/Demeanor/Rapport:  Other (cooperative) Affect (typically observed):  Pleasant, Appropriate Orientation:  Oriented to Self, Oriented to Place, Oriented to  Time, Oriented to Situation Alcohol / Substance use:  Not Applicable Psych involvement (Current and /or in the community):  No (Comment)  Discharge Needs  Concerns to be addressed:  Discharge Planning Concerns Readmission within the last 30 days:  No Current discharge risk:  None Barriers to Discharge:  No Barriers Identified   Aayra Hornbaker, Randall An, LCSW 12/05/2014, 9:51 AM

## 2014-12-06 LAB — CBC
HCT: 24.9 % — ABNORMAL LOW (ref 36.0–46.0)
Hemoglobin: 8.1 g/dL — ABNORMAL LOW (ref 12.0–15.0)
MCH: 30.5 pg (ref 26.0–34.0)
MCHC: 32.5 g/dL (ref 30.0–36.0)
MCV: 93.6 fL (ref 78.0–100.0)
Platelets: 156 10*3/uL (ref 150–400)
RBC: 2.66 MIL/uL — ABNORMAL LOW (ref 3.87–5.11)
RDW: 14.6 % (ref 11.5–15.5)
WBC: 10.9 10*3/uL — ABNORMAL HIGH (ref 4.0–10.5)

## 2014-12-06 LAB — BASIC METABOLIC PANEL
Anion gap: 8 (ref 5–15)
BUN: 84 mg/dL — AB (ref 6–20)
CALCIUM: 7.9 mg/dL — AB (ref 8.9–10.3)
CO2: 26 mmol/L (ref 22–32)
CREATININE: 3.44 mg/dL — AB (ref 0.44–1.00)
Chloride: 102 mmol/L (ref 101–111)
GFR calc non Af Amer: 12 mL/min — ABNORMAL LOW (ref >60)
GFR, EST AFRICAN AMERICAN: 13 mL/min — AB (ref >60)
Glucose, Bld: 128 mg/dL — ABNORMAL HIGH (ref 65–99)
Potassium: 5.1 mmol/L (ref 3.5–5.1)
SODIUM: 136 mmol/L (ref 135–145)

## 2014-12-06 LAB — PROTIME-INR
INR: 1.45 (ref 0.00–1.49)
PROTHROMBIN TIME: 17.7 s — AB (ref 11.6–15.2)

## 2014-12-06 MED ORDER — WARFARIN SODIUM 2.5 MG PO TABS
2.5000 mg | ORAL_TABLET | Freq: Once | ORAL | Status: AC
  Administered 2014-12-06: 2.5 mg via ORAL
  Filled 2014-12-06 (×2): qty 1

## 2014-12-06 MED ORDER — SODIUM CHLORIDE 0.9 % IV SOLN
INTRAVENOUS | Status: DC
  Administered 2014-12-06: 50 mL/h via INTRAVENOUS

## 2014-12-06 NOTE — Progress Notes (Addendum)
     Subjective: 2 Days Post-Op Procedure(s) (LRB): RIGHT TOTAL  KNEE ARTHROPLASTY (Right)   Patient reports pain as mild, pain controlled. Little incident of increased pain caused her not to sleep well, but otherwise doing well. Ready to be discharged to skilled nursing facility.  Objective:   VITALS:   Filed Vitals:   12/06/14  BP: 135/67  Pulse: 72  Temp: 97.8 F (36.6 C)   Resp: 15    Dorsiflexion/Plantar flexion intact Incision: dressing C/D/I No cellulitis present Compartment soft  LABS  Recent Labs  12/04/14 2243 12/05/14 0800 12/06/14 0450  HGB 9.5* 9.3* 8.1*  HCT 29.4* 27.8* 24.9*  WBC 9.3 10.3 10.9*  PLT 167 PLATELET CLUMPS NOTED ON SMEAR, UNABLE TO ESTIMATE 156     Recent Labs  12/04/14 2243 12/05/14 0800 12/06/14 0450  NA  --  138 136  K  --  5.1 5.1  BUN  --  66* 84*  CREATININE 3.12* 3.16* 3.44*  GLUCOSE  --  121* 128*     Assessment/Plan: 2 Days Post-Op Procedure(s) (LRB): RIGHT TOTAL  KNEE ARTHROPLASTY (Right)  Encourage oral intake of fluids to help kidney function. First IV failed and second infiltrated. Up with therapy Discharge to SNF when ready     Anastasio Auerbach. Gram Siedlecki   PAC  12/06/2014, 9:46 AM

## 2014-12-06 NOTE — Progress Notes (Signed)
ANTICOAGULATION CONSULT NOTE - Follow up  Pharmacy Consult for warfarin Indication: VTE prophylaxis, atrial fibrillation, hx of PE, stroke  Allergies  Allergen Reactions  . Codeine Hives  . Darvocet [Propoxyphene N-Acetaminophen] Hives  . Nubain [Nalbuphine Hcl] Hives    Went into cardiac arrest     Patient Measurements: Height: 5' (152.4 cm) Weight: 169 lb (76.658 kg) IBW/kg (Calculated) : 45.5  Vital Signs: Temp: 98.2 F (36.8 C) (08/17 0531) Temp Source: Oral (08/17 0531) BP: 120/52 mmHg (08/17 1041) Pulse Rate: 78 (08/17 1041)  Labs:  Recent Labs  12/04/14 0915  12/04/14 2243 12/05/14 0800 12/06/14 0450  HGB  --   < > 9.5* 9.3* 8.1*  HCT  --   --  29.4* 27.8* 24.9*  PLT  --   --  167 PLATELET CLUMPS NOTED ON SMEAR, UNABLE TO ESTIMATE 156  LABPROT 15.0  --   --  14.7 17.7*  INR 1.16  --   --  1.13 1.45  CREATININE  --   --  3.12* 3.16* 3.44*  < > = values in this interval not displayed.  Estimated Creatinine Clearance: 11.9 mL/min (by C-G formula based on Cr of 3.44).   Medical History: Past Medical History  Diagnosis Date  . Syncope   . Symptomatic bradycardia   . Chronic lower back pain   . Nonischemic cardiomyopathy   . HTN (hypertension)   . HLD (hyperlipidemia)   . Atrial fibrillation     tachy-brady syndrome with <1% recurrent PAF since pacemaker placement  . H/O: stroke   . Pulmonary embolism     HISTORY OF, the pt. had a recurrent bilateral pulmonary emboli in 2005, on warfarin therapy and at which time she under went implantation of IVC filter  . Anemia   . Dementia   . Gastroesophageal reflux disease   . Aortic stenosis 10/13/2012    Low EF, low gradient with severe aortic stenosis confirmed by dobutamine stress echocardiogram s/p TAVR 12/2012  . PONV (postoperative nausea and vomiting)   . Osteoarthritis   . Neuropathy   . Spinal stenosis   . Symptomatic bradycardia 2012    s/p Medtronic PPM  . Chronic combined systolic and diastolic  CHF (congestive heart failure)   . Fibromyalgia   . Presence of permanent cardiac pacemaker   . Sleep apnea     uses oxygen at night and PRN- not used since > 6 months   . Shortness of breath dyspnea     with exertion   . Pneumonia     hx of x 3   . Acute on chronic renal failure     sees Dr Allena Katz   . CKD (chronic kidney disease)   . Headache     hx of migraines   . On home oxygen therapy     patient uses at nite- 2L- has not used in > 6 months per patient    Assessment:  84 yoF s/p R TKA; on warfarin PTA for AFib and hx of PE. Pharmacy to resume warfarin while admitted; to continue on Lovenox until INR >/= 1.8. Home warfarin regimen reported as 2.5 mg daily.  Today, 12/06/2014:  INR subtherapeutic, now rising after 3 mg daily x 2 doses  CBC: Hgb decreased to 8.1. Pltc WNL.   SCr is elevated and increasing, CrCl ~12 ml/min.  Major drug interactions: amiodarone, aspirin , levothyroxine (all PTA meds along with warfarin)  No bleeding issues per nursing.  Diet: regular, 100% of breakfast charted  today  Goal of Therapy: INR 2-3  Plan:  Warfarin 2.5mg  PO tonight   Continue Lovenox 30mg  SQ q24h untl INR >/= 1.8 per MD order  Daily PT/INR  CBC at least q72h while on warfarin-will repeat CBC in AM  Monitor for signs of bleeding   Greer Pickerel, PharmD, BCPS Pager: [phone] 12/06/2014 1:53 PM

## 2014-12-06 NOTE — Progress Notes (Signed)
Physical Therapy Treatment Patient Details Name: [name] MRN: [id number] DOB: Apr 24, 1932 Today's Date: 12/06/2014    History of Present Illness s/p R TKA    PT Comments    POD # 2 am session.  Pt OOB in recliner.  Assisted to [facility] then amb a limited distance in hallway + 2 assist for safety.  Returned to [employer] perform TKR TE's mostly AAROM. Pt progressing slowly and will need ST Rehab at SNF.   Follow Up Recommendations  SNF     Equipment Recommendations       Recommendations for Other Services       Precautions / Restrictions      Mobility  Bed Mobility               General bed mobility comments: Pt OOB in recliner  Transfers Overall transfer level: Needs assistance Equipment used: Rolling walker (2 wheeled) Transfers: Sit to/from Stand Sit to Stand: Min assist;Mod assist;+2 safety/equipment;+2 physical assistance         General transfer comment: assist to rise and steady. Cues for UE/LE Placement.  assisted on/off BSC  Ambulation/Gait Ambulation/Gait assistance: Min assist;Mod assist;+2 safety/equipment Ambulation Distance (Feet): 18 Feet Assistive device: Rolling walker (2 wheeled)   Gait velocity: decr   General Gait Details: cues for sequence, saftey awareness, posture and position from RW.  Noted uncreased RR and c/o fatigue   Stairs            Wheelchair Mobility    Modified Rankin (Stroke Patients Only)       Balance                                    Cognition Arousal/Alertness: Awake/alert Behavior During Therapy: WFL for tasks assessed/performed   Area of Impairment: Safety/judgement         Safety/Judgement: Decreased awareness of safety     General Comments: HOH;     Exercises   Total Knee Replacement TE's 10 reps B LE ankle pumps 10 reps towel squeezes 10 reps knee presses 10 reps heel slides  10 reps SAQ's 10 reps SLR's 10 reps ABD Followed by ICE     General Comments         Pertinent Vitals/Pain Pain Assessment: 0-10 Pain Score: 5  Pain Descriptors / Indicators: Aching;Sore Pain Intervention(s): Monitored during session;Premedicated before session;Repositioned;Ice applied    Home Living                      Prior Function            PT Goals (current goals can now be found in the care plan section) Progress towards PT goals: Progressing toward goals    Frequency       PT Plan Current plan remains appropriate    Co-evaluation             End of Session Equipment Utilized During Treatment: Gait belt Activity Tolerance: Patient limited by fatigue Patient left: in chair;with call bell/phone within reach     Time: 1105-1130 PT Time Calculation (min) (ACUTE ONLY): 25 min  Charges:  $Gait Training: 8-22 mins $Therapeutic Exercise: 8-22 mins                    G Codes:      Felecia Shelling  PTA [facility]  Acute  Rehab Pager      [phone]

## 2014-12-06 NOTE — Progress Notes (Signed)
Physical Therapy Treatment Patient Details Name: [name] MRN: [id number] DOB: December 30, 1932 Today's Date: 12/06/2014    History of Present Illness s/p R TKA    PT Comments    POD # 2 assisted from recliner to [facility] then amb a limited distances due to increased c/o pain and fatigue.  Assisted back to bed with increased time to position to comfort.   Follow Up Recommendations  SNF     Equipment Recommendations       Recommendations for Other Services       Precautions / Restrictions      Mobility  Bed Mobility               General bed mobility comments: Pt OOB in recliner  Transfers Overall transfer level: Needs assistance Equipment used: Rolling walker (2 wheeled) Transfers: Sit to/from Stand Sit to Stand: Min assist;Mod assist;+2 safety/equipment;+2 physical assistance         General transfer comment: assist to rise and steady. Cues for UE/LE Placement.  assisted on/off BSC  Ambulation/Gait Ambulation/Gait assistance: Min assist;Mod assist;+2 safety/equipment Ambulation Distance (Feet): 18 Feet Assistive device: Rolling walker (2 wheeled)   Gait velocity: decr   General Gait Details: cues for sequence, saftey awareness, posture and position from RW.  Noted uncreased RR and c/o fatigue   Stairs            Wheelchair Mobility    Modified Rankin (Stroke Patients Only)       Balance                                    Cognition Arousal/Alertness: Awake/alert Behavior During Therapy: WFL for tasks assessed/performed   Area of Impairment: Safety/judgement         Safety/Judgement: Decreased awareness of safety     General Comments: HOH;     Exercises      General Comments        Pertinent Vitals/Pain Pain Assessment: 0-10 Pain Score: 5  Pain Descriptors / Indicators: Aching;Sore Pain Intervention(s): Monitored during session;Premedicated before session;Repositioned;Ice applied    Home Living                       Prior Function            PT Goals (current goals can now be found in the care plan section) Progress towards PT goals: Progressing toward goals    Frequency       PT Plan Current plan remains appropriate    Co-evaluation             End of Session Equipment Utilized During Treatment: Gait belt Activity Tolerance: Patient limited by fatigue Patient left: in chair;with call bell/phone within reach     Time: 1347-1417 PT Time Calculation (min) (ACUTE ONLY): 30 min  Charges:  $Gait Training: 8-22 mins $Therapeutic Activity: 8-22 mins                    G Codes:      Felecia Shelling  PTA [facility]  Acute  Rehab Pager      [phone]

## 2014-12-07 DIAGNOSIS — D509 Iron deficiency anemia, unspecified: Secondary | ICD-10-CM | POA: Diagnosis not present

## 2014-12-07 DIAGNOSIS — M797 Fibromyalgia: Secondary | ICD-10-CM | POA: Diagnosis not present

## 2014-12-07 DIAGNOSIS — N183 Chronic kidney disease, stage 3 (moderate): Secondary | ICD-10-CM | POA: Diagnosis not present

## 2014-12-07 DIAGNOSIS — Z79891 Long term (current) use of opiate analgesic: Secondary | ICD-10-CM | POA: Diagnosis not present

## 2014-12-07 DIAGNOSIS — Z96651 Presence of right artificial knee joint: Secondary | ICD-10-CM | POA: Diagnosis not present

## 2014-12-07 DIAGNOSIS — R0901 Asphyxia: Secondary | ICD-10-CM | POA: Diagnosis not present

## 2014-12-07 DIAGNOSIS — I428 Other cardiomyopathies: Secondary | ICD-10-CM | POA: Diagnosis not present

## 2014-12-07 DIAGNOSIS — Z86711 Personal history of pulmonary embolism: Secondary | ICD-10-CM | POA: Diagnosis not present

## 2014-12-07 DIAGNOSIS — K219 Gastro-esophageal reflux disease without esophagitis: Secondary | ICD-10-CM | POA: Diagnosis not present

## 2014-12-07 DIAGNOSIS — M1990 Unspecified osteoarthritis, unspecified site: Secondary | ICD-10-CM | POA: Diagnosis not present

## 2014-12-07 DIAGNOSIS — I129 Hypertensive chronic kidney disease with stage 1 through stage 4 chronic kidney disease, or unspecified chronic kidney disease: Secondary | ICD-10-CM | POA: Diagnosis not present

## 2014-12-07 DIAGNOSIS — R1314 Dysphagia, pharyngoesophageal phase: Secondary | ICD-10-CM | POA: Diagnosis not present

## 2014-12-07 DIAGNOSIS — R2681 Unsteadiness on feet: Secondary | ICD-10-CM | POA: Diagnosis not present

## 2014-12-07 DIAGNOSIS — D649 Anemia, unspecified: Secondary | ICD-10-CM | POA: Diagnosis not present

## 2014-12-07 DIAGNOSIS — I5042 Chronic combined systolic (congestive) and diastolic (congestive) heart failure: Secondary | ICD-10-CM | POA: Diagnosis not present

## 2014-12-07 DIAGNOSIS — Z952 Presence of prosthetic heart valve: Secondary | ICD-10-CM | POA: Diagnosis not present

## 2014-12-07 DIAGNOSIS — I10 Essential (primary) hypertension: Secondary | ICD-10-CM | POA: Diagnosis not present

## 2014-12-07 DIAGNOSIS — F0390 Unspecified dementia without behavioral disturbance: Secondary | ICD-10-CM | POA: Diagnosis not present

## 2014-12-07 DIAGNOSIS — F329 Major depressive disorder, single episode, unspecified: Secondary | ICD-10-CM | POA: Diagnosis not present

## 2014-12-07 DIAGNOSIS — Z7983 Long term (current) use of bisphosphonates: Secondary | ICD-10-CM | POA: Diagnosis not present

## 2014-12-07 DIAGNOSIS — G4730 Sleep apnea, unspecified: Secondary | ICD-10-CM | POA: Diagnosis not present

## 2014-12-07 DIAGNOSIS — J690 Pneumonitis due to inhalation of food and vomit: Secondary | ICD-10-CM | POA: Diagnosis not present

## 2014-12-07 DIAGNOSIS — R1310 Dysphagia, unspecified: Secondary | ICD-10-CM | POA: Diagnosis not present

## 2014-12-07 DIAGNOSIS — M25569 Pain in unspecified knee: Secondary | ICD-10-CM | POA: Diagnosis not present

## 2014-12-07 DIAGNOSIS — E785 Hyperlipidemia, unspecified: Secondary | ICD-10-CM | POA: Diagnosis not present

## 2014-12-07 DIAGNOSIS — I495 Sick sinus syndrome: Secondary | ICD-10-CM | POA: Diagnosis not present

## 2014-12-07 DIAGNOSIS — I480 Paroxysmal atrial fibrillation: Secondary | ICD-10-CM | POA: Diagnosis not present

## 2014-12-07 DIAGNOSIS — Z950 Presence of cardiac pacemaker: Secondary | ICD-10-CM | POA: Diagnosis not present

## 2014-12-07 DIAGNOSIS — Z7901 Long term (current) use of anticoagulants: Secondary | ICD-10-CM | POA: Diagnosis not present

## 2014-12-07 DIAGNOSIS — E039 Hypothyroidism, unspecified: Secondary | ICD-10-CM | POA: Diagnosis not present

## 2014-12-07 DIAGNOSIS — M25661 Stiffness of right knee, not elsewhere classified: Secondary | ICD-10-CM | POA: Diagnosis not present

## 2014-12-07 DIAGNOSIS — T17320A Food in larynx causing asphyxiation, initial encounter: Secondary | ICD-10-CM | POA: Diagnosis not present

## 2014-12-07 DIAGNOSIS — Z9889 Other specified postprocedural states: Secondary | ICD-10-CM | POA: Diagnosis not present

## 2014-12-07 DIAGNOSIS — D62 Acute posthemorrhagic anemia: Secondary | ICD-10-CM | POA: Diagnosis not present

## 2014-12-07 DIAGNOSIS — Z79899 Other long term (current) drug therapy: Secondary | ICD-10-CM | POA: Diagnosis not present

## 2014-12-07 DIAGNOSIS — M1711 Unilateral primary osteoarthritis, right knee: Secondary | ICD-10-CM | POA: Diagnosis not present

## 2014-12-07 DIAGNOSIS — T17998A Other foreign object in respiratory tract, part unspecified causing other injury, initial encounter: Secondary | ICD-10-CM | POA: Diagnosis not present

## 2014-12-07 DIAGNOSIS — I4891 Unspecified atrial fibrillation: Secondary | ICD-10-CM | POA: Diagnosis not present

## 2014-12-07 DIAGNOSIS — Z471 Aftercare following joint replacement surgery: Secondary | ICD-10-CM | POA: Diagnosis not present

## 2014-12-07 DIAGNOSIS — R061 Stridor: Secondary | ICD-10-CM | POA: Diagnosis not present

## 2014-12-07 DIAGNOSIS — J309 Allergic rhinitis, unspecified: Secondary | ICD-10-CM | POA: Diagnosis not present

## 2014-12-07 DIAGNOSIS — M109 Gout, unspecified: Secondary | ICD-10-CM | POA: Diagnosis not present

## 2014-12-07 DIAGNOSIS — R41841 Cognitive communication deficit: Secondary | ICD-10-CM | POA: Diagnosis not present

## 2014-12-07 DIAGNOSIS — Z8673 Personal history of transient ischemic attack (TIA), and cerebral infarction without residual deficits: Secondary | ICD-10-CM | POA: Diagnosis not present

## 2014-12-07 DIAGNOSIS — R05 Cough: Secondary | ICD-10-CM | POA: Diagnosis not present

## 2014-12-07 DIAGNOSIS — M6281 Muscle weakness (generalized): Secondary | ICD-10-CM | POA: Diagnosis not present

## 2014-12-07 DIAGNOSIS — T17890A Other foreign object in other parts of respiratory tract causing asphyxiation, initial encounter: Secondary | ICD-10-CM | POA: Diagnosis present

## 2014-12-07 LAB — CBC
HEMATOCRIT: 25.5 % — AB (ref 36.0–46.0)
Hemoglobin: 8.2 g/dL — ABNORMAL LOW (ref 12.0–15.0)
MCH: 30.1 pg (ref 26.0–34.0)
MCHC: 32.2 g/dL (ref 30.0–36.0)
MCV: 93.8 fL (ref 78.0–100.0)
PLATELETS: 170 10*3/uL (ref 150–400)
RBC: 2.72 MIL/uL — AB (ref 3.87–5.11)
RDW: 14.8 % (ref 11.5–15.5)
WBC: 9 10*3/uL (ref 4.0–10.5)

## 2014-12-07 LAB — BASIC METABOLIC PANEL
ANION GAP: 8 (ref 5–15)
BUN: 94 mg/dL — AB (ref 6–20)
CHLORIDE: 99 mmol/L — AB (ref 101–111)
CO2: 26 mmol/L (ref 22–32)
Calcium: 7.5 mg/dL — ABNORMAL LOW (ref 8.9–10.3)
Creatinine, Ser: 3.37 mg/dL — ABNORMAL HIGH (ref 0.44–1.00)
GFR calc Af Amer: 14 mL/min — ABNORMAL LOW (ref >60)
GFR, EST NON AFRICAN AMERICAN: 12 mL/min — AB (ref >60)
GLUCOSE: 90 mg/dL (ref 65–99)
POTASSIUM: 4.2 mmol/L (ref 3.5–5.1)
Sodium: 133 mmol/L — ABNORMAL LOW (ref 135–145)

## 2014-12-07 LAB — PROTIME-INR
INR: 1.62 — ABNORMAL HIGH (ref 0.00–1.49)
Prothrombin Time: 19.3 seconds — ABNORMAL HIGH (ref 11.6–15.2)

## 2014-12-07 LAB — GLUCOSE, CAPILLARY: Glucose-Capillary: 144 mg/dL — ABNORMAL HIGH (ref 65–99)

## 2014-12-07 MED ORDER — ENOXAPARIN SODIUM 30 MG/0.3ML ~~LOC~~ SOLN: 30.0000 mg | SUBCUTANEOUS | Status: DC

## 2014-12-07 MED ORDER — HYDROCODONE-ACETAMINOPHEN 10-325 MG PO TABS: 1.0000 | ORAL_TABLET | ORAL | Status: DC | PRN

## 2014-12-07 MED ORDER — WARFARIN SODIUM 2.5 MG PO TABS
2.5000 mg | ORAL_TABLET | Freq: Once | ORAL | Status: DC
  Filled 2014-12-07: qty 1

## 2014-12-07 MED ORDER — WARFARIN SODIUM 2.5 MG PO TABS: 2.5000 mg | ORAL_TABLET | Freq: Once | ORAL | Status: DC

## 2014-12-07 MED ORDER — FERROUS SULFATE 325 (65 FE) MG PO TABS: 325.0000 mg | ORAL_TABLET | Freq: Three times a day (TID) | ORAL | Status: DC

## 2014-12-07 MED ORDER — POLYETHYLENE GLYCOL 3350 17 G PO PACK: 17.0000 g | PACK | Freq: Two times a day (BID) | ORAL | Status: DC

## 2014-12-07 NOTE — Discharge Instructions (Signed)

## 2014-12-07 NOTE — Progress Notes (Signed)
     Subjective: 3 Days Post-Op Procedure(s) (LRB): RIGHT TOTAL  KNEE ARTHROPLASTY (Right)   Patient reports pain as mild, pain controlled.  Incident of increased pain last night which effected her ability to sleep.  Other than a little tired and pain no other events throughout the night.  Discussed her kidney function with Dr. Charlann Boxer, he is ok with her leaving to SNF today.  She is again encouraged to increase oral intake of fluids.   Objective:   VITALS:   Filed Vitals:   12/07/14  BP: 137/66  Pulse: 65  Temp: 98.4 F (36.9 C)   Resp: 20    Dorsiflexion/Plantar flexion intact Incision: dressing C/D/I No cellulitis present Compartment soft  LABS  Recent Labs  12/05/14 0800 12/06/14 0450 12/07/14 0530  HGB 9.3* 8.1* 8.2*  HCT 27.8* 24.9* 25.5*  WBC 10.3 10.9* 9.0  PLT PLATELET CLUMPS NOTED ON SMEAR, UNABLE TO ESTIMATE 156 170     Recent Labs  12/05/14 0800 12/06/14 0450 12/07/14 0530  NA 138 136 133*  K 5.1 5.1 4.2  BUN 66* 84* 94*  CREATININE 3.16* 3.44* 3.37*  GLUCOSE 121* 128* 90     Assessment/Plan: 3 Days Post-Op Procedure(s) (LRB): RIGHT TOTAL  KNEE ARTHROPLASTY (Right) Up with therapy Discharge to SNF Follow up in 2 weeks at [facility]. Follow up with OLIN,Jori Thrall D in 2 weeks.  Contact information:  [facility] [address] [place] [address] [phone]       Anastasio Auerbach. Geral Coker   PAC  12/07/2014, 9:05 AM

## 2014-12-07 NOTE — Progress Notes (Signed)
ANTICOAGULATION CONSULT NOTE - Follow up  Pharmacy Consult for warfarin Indication: VTE prophylaxis, atrial fibrillation, hx of PE, stroke  Allergies  Allergen Reactions  . Codeine Hives  . Darvocet [Propoxyphene N-Acetaminophen] Hives  . Nubain [Nalbuphine Hcl] Hives    Went into cardiac arrest     Patient Measurements: Height: 5' (152.4 cm) Weight: 169 lb (76.658 kg) IBW/kg (Calculated) : 45.5  Vital Signs: Temp: 98.4 F (36.9 C) (08/18 0459) Temp Source: Oral (08/18 0459) BP: 137/66 mmHg (08/18 0732) Pulse Rate: 65 (08/18 0732)  Labs:  Recent Labs  12/05/14 0800 12/06/14 0450 12/07/14 0530  HGB 9.3* 8.1* 8.2*  HCT 27.8* 24.9* 25.5*  PLT PLATELET CLUMPS NOTED ON SMEAR, UNABLE TO ESTIMATE 156 170  LABPROT 14.7 17.7* 19.3*  INR 1.13 1.45 1.62*  CREATININE 3.16* 3.44* 3.37*    Estimated Creatinine Clearance: 12.2 mL/min (by C-G formula based on Cr of 3.37).   Assessment:  51 yoF s/p R TKA; on warfarin PTA for AFib and hx of PE. Pharmacy to resume warfarin while admitted; to continue on Lovenox until INR >/= 1.8. Home warfarin regimen reported as 2.5 mg daily.  Today, 12/07/2014:  INR subtherapeutic, now rising after restarting warfarin 8/15  CBC: Hgb decreased at 8.2 (stable from 8/17). Pltc WNL.   SCr is elevated/stable from 8/17, CrCl ~12 ml/min.  Major drug interactions: amiodarone, aspirin 81mg , levothyroxine (all PTA meds along with warfarin)  No bleeding issues per nursing.  Diet: regular: intake appears to be erratic from charting  Goal of Therapy: INR 2-3  Plan:  Warfarin 2.5mg  PO tonight   Suggest continue warfarin 2.5mg  daily at discharge with close INR follow-up (suggest INR on Fri 8/19 and Mon 8/22)  Continue Lovenox 30mg  SQ q24h untl INR >/= 1.8 per MD order  Daily PT/INR  CBC at least q72h while on warfarin-will repeat CBC in AM  Monitor for signs of bleeding  Juliette Alcide, PharmD, BCPS.   Pager: [phone] 12/07/2014 8:00  AM

## 2014-12-07 NOTE — Care Management Important Message (Signed)
Important Message  Patient Details  Name: DONICA KORBEL MRN: [id number] Date of Birth: [birth year]   Medicare Important Message Given:  Yes-third notification given    Haskell Flirt 12/07/2014, 1:29 PMImportant Message  Patient Details  Name: SHARRELL LUTE MRN: [id number] Date of Birth: 12-Jul-1932   Medicare Important Message Given:  Yes-third notification given    Haskell Flirt 12/07/2014, 1:28 PM

## 2014-12-07 NOTE — Discharge Summary (Signed)
Physician Discharge Summary  Patient ID: [name] MRN: [id number] DOB/AGE: 12-11-1932 81 y.o.  Admit date: 12/04/2014 Discharge date:  12/07/2014  Procedures:  Procedure(s) (LRB): RIGHT TOTAL  KNEE ARTHROPLASTY (Right)  Attending Physician:  Dr. Durene Romans   Admission Diagnoses:   Right knee primary OA / pain  Discharge Diagnoses:  Principal Problem:   S/P right TKA Active Problems:   S/P knee replacement  Past Medical History  Diagnosis Date  . Syncope   . Symptomatic bradycardia   . Chronic lower back pain   . Nonischemic cardiomyopathy   . HTN (hypertension)   . HLD (hyperlipidemia)   . Atrial fibrillation     tachy-brady syndrome with <1% recurrent PAF since pacemaker placement  . H/O: stroke   . Pulmonary embolism     HISTORY OF, the pt. had a recurrent bilateral pulmonary emboli in 2005, on warfarin therapy and at which time she under went implantation of IVC filter  . Anemia   . Dementia   . Gastroesophageal reflux disease   . Aortic stenosis 10/13/2012    Low EF, low gradient with severe aortic stenosis confirmed by dobutamine stress echocardiogram s/p TAVR 12/2012  . PONV (postoperative nausea and vomiting)   . Osteoarthritis   . Neuropathy   . Spinal stenosis   . Symptomatic bradycardia 2012    s/p Medtronic PPM  . Chronic combined systolic and diastolic CHF (congestive heart failure)   . Fibromyalgia   . Presence of permanent cardiac pacemaker   . Sleep apnea     uses oxygen at night and PRN- not used since > 6 months   . Shortness of breath dyspnea     with exertion   . Pneumonia     hx of x 3   . Acute on chronic renal failure     sees Dr Allena Katz   . CKD (chronic kidney disease)   . Headache     hx of migraines   . On home oxygen therapy     patient uses at nite- 2L- has not used in > 6 months per patient    HPI:    [name], 81 y.o. female, has a history of pain and functional disability in the right knee due to arthritis  and has failed non-surgical conservative treatments for greater than 12 weeks to includeNSAID's and/or analgesics, corticosteriod injections, viscosupplementation injections, use of assistive devices and activity modification. Onset of symptoms was gradual, starting years ago with gradually worsening course since that time. The patient noted no past surgery on the right knee(s). Patient currently rates pain in the right knee(s) at 8 out of 10 with activity. Patient has night pain, worsening of pain with activity and weight bearing, pain that interferes with activities of daily living, pain with passive range of motion, crepitus and joint swelling. Patient has evidence of periarticular osteophytes and joint space narrowing by imaging studies. There is no active infection. Risks, benefits and expectations were discussed with the patient. Risks including but not limited to the risk of anesthesia, blood clots, nerve damage, blood vessel damage, failure of the prosthesis, infection and up to and including death. Patient understand the risks, benefits and expectations and wishes to proceed with surgery.   PCP: Pearson Grippe, MD   Discharged Condition: good  Hospital Course:  Patient underwent the above stated procedure on 12/04/2014. Patient tolerated the procedure well and brought to the recovery room in good condition and subsequently to the floor.  POD #  1 BP: 114/56 ; Pulse: 64 ; Temp: 98.3 F (36.8 C) ; Resp: 18 Patient reports pain as mild. Up eating breakfast this am. No events last night. Wants to got to rehab prior to home due to lack of help at home. Dorsiflexion/plantar flexion intact, incision: dressing C/D/I, no cellulitis present and compartment soft.   LABS  Basename    HGB  9.3  HCT  27.8  BUN 66  CREAT 3.16   POD #2  BP: 135/67 ; Pulse: 72 ; Temp: 97.8 F (36.6 C) ; Resp: 15 Patient reports pain as mild, pain controlled. No events throughout the night, feels good this AM.    BUN and creat increasing, ordered for saline to be given, no IV access could be obtained and she was encouraged to increase oral intake of fluids. Dorsiflexion/plantar flexion intact, incision: dressing C/D/I, no cellulitis present and compartment soft.   LABS  Basename    HGB  8.1  HCT  24.9  BUN 84  CREAT 3.44   POD #3  BP: 137/66 ; Pulse: 65 ; Temp: 98.4 F (36.9 C) ; Resp: 20 Patient reports pain as mild, pain controlled. Incident of increased pain last night which effected her ability to sleep. Other than a little tired and pain no other events throughout the night. Discussed her kidney function with Dr. Charlann Boxer, he is ok with her leaving to SNF today. She is again encouraged to increase oral intake of fluids. Dorsiflexion/plantar flexion intact, incision: dressing C/D/I, no cellulitis present and compartment soft.   LABS  Basename    HGB  8.2  HCT  25.5  BUN 94  CREAT 3.37    Discharge Exam: General appearance: alert, cooperative and no distress Extremities: Homans sign is negative, no sign of DVT, no edema, redness or tenderness in the calves or thighs and no ulcers, gangrene or trophic changes  Disposition:   Skilled Nursing Facility with follow up in 2 weeks   Follow-up Information    Follow up with [name], MD. Schedule an appointment as soon as possible for a visit in 2 weeks.   Specialty:  Orthopedic Surgery   Contact information:   [address] [place] [address] [phone]       Discharge Instructions    Call MD / Call 911    Complete by:  As directed   If you experience chest pain or shortness of breath, CALL 911 and be transported to the hospital emergency room.  If you develope a fever above 101 F, pus (white drainage) or increased drainage or redness at the wound, or calf pain, call your surgeon's office.     Change dressing    Complete by:  As directed   Maintain surgical dressing until follow up in the clinic. If the  edges start to pull up, may reinforce with tape. If the dressing is no longer working, may remove and cover with gauze and tape, but must keep the area dry and clean.  Call with any questions or concerns.     Constipation Prevention    Complete by:  As directed   Drink plenty of fluids.  Prune juice may be helpful.  You may use a stool softener, such as Colace (over the counter) 100 mg twice a day.  Use MiraLax (over the counter) for constipation as needed.     Diet - low sodium heart healthy    Complete by:  As directed      Discharge instructions  Complete by:  As directed   Maintain surgical dressing until follow up in the clinic. If the edges start to pull up, may reinforce with tape. If the dressing is no longer working, may remove and cover with gauze and tape, but must keep the area dry and clean.  Follow up in 2 weeks at [facility]. Call with any questions or concerns.     Increase activity slowly as tolerated    Complete by:  As directed   Weight bearing as tolerated with assist device (walker, cane, etc) as directed, use it as long as suggested by your surgeon or therapist, typically at least 4-6 weeks.     TED hose    Complete by:  As directed   Use stockings (TED hose) for 2 weeks on both leg(s).  You may remove them at night for sleeping.             Medication List    STOP taking these medications        aspirin 81 MG EC tablet      TAKE these medications        alendronate 70 MG tablet  Commonly known as:  FOSAMAX  Take 70 mg by mouth once a week. Saturday.     amiodarone 200 MG tablet  Commonly known as:  PACERONE  Take 0.5 tablets (100 mg total) by mouth daily.     BIOTIN 5000 PO  Take 5,000 mcg by mouth every evening.     calcitRIOL 0.25 MCG capsule  Commonly known as:  ROCALTROL  Take 0.25 mcg by mouth every morning.     carvedilol 12.5 MG tablet  Commonly known as:  COREG  Take 1 tablet by mouth 2 (two) times daily.     cetirizine 10  MG tablet  Commonly known as:  ZYRTEC  Take 10 mg by mouth every morning.     colchicine 0.6 MG tablet  Take 0.6 mg by mouth every other day. PM     cycloSPORINE 0.05 % ophthalmic emulsion  Commonly known as:  RESTASIS  Place 1 drop into both eyes 2 (two) times daily as needed (dry eyes.).     docusate sodium 50 MG capsule  Commonly known as:  COLACE  Take 50 mg by mouth 2 (two) times daily.     donepezil 10 MG tablet  Commonly known as:  ARICEPT  Take 10 mg by mouth at bedtime.     DULoxetine 60 MG capsule  Commonly known as:  CYMBALTA  Take 60 mg by mouth every evening.     enoxaparin 30 MG/0.3ML injection  Commonly known as:  LOVENOX  Inject 0.3 mLs (30 mg total) into the skin daily.     esomeprazole 40 MG capsule  Commonly known as:  NEXIUM  Take 40 mg by mouth daily before breakfast.     ferrous sulfate 325 (65 FE) MG tablet  Take 1 tablet (325 mg total) by mouth 3 (three) times daily after meals.     hydrALAZINE 10 MG tablet  Commonly known as:  APRESOLINE  Take 10 mg by mouth 3 (three) times daily.     HYDROcodone-acetaminophen 10-325 MG per tablet  Commonly known as:  NORCO  Take 1-2 tablets by mouth every 4 (four) hours as needed.     levothyroxine 100 MCG tablet  Commonly known as:  SYNTHROID, LEVOTHROID  Take 100 mcg by mouth every morning.     LINZESS 145 MCG Caps capsule  Generic drug:  Linaclotide  Take 145 mcg by mouth every other day. Alternates with the 290 mg tablet in the am.     Linaclotide 290 MCG Caps capsule  Commonly known as:  LINZESS  Take 290 mcg by mouth every other day. Alternates with the 145 mg tablets in the morning.     memantine 10 MG tablet  Commonly known as:  NAMENDA  Take 10 mg by mouth 2 (two) times daily.     multivitamin tablet  Take 1 tablet by mouth every morning.     nitroGLYCERIN 0.4 MG SL tablet  Commonly known as:  NITROSTAT  Place 0.4 mg under the tongue every 5 (five) minutes as needed for chest pain (MAX  3 TABLETS).     NON FORMULARY  2 Liters oxygen as needed for shortness of breath     polyethylene glycol packet  Commonly known as:  MIRALAX / GLYCOLAX  Take 17 g by mouth 2 (two) times daily.     pravastatin 40 MG tablet  Commonly known as:  PRAVACHOL  Take 40 mg by mouth every evening.     pregabalin 75 MG capsule  Commonly known as:  LYRICA  Take 75 mg by mouth 2 (two) times daily.     torsemide 20 MG tablet  Commonly known as:  DEMADEX  Take 20 mg by mouth 2 (two) times daily. PEr Dr Allena Katz - patient to not take on 8/14 nor am of 8/15     Turmeric 500 MG Caps  Take 1 capsule by mouth 2 (two) times daily.     ULORIC 40 MG tablet  Generic drug:  febuxostat  Take 40 mg by mouth every evening.     vitamin B-12 1000 MCG tablet  Commonly known as:  CYANOCOBALAMIN  Take 1,000 mcg by mouth 2 (two) times a week. Tuesday and Friday     Vitamin D 1000 UNITS capsule  Take 2,000 Units by mouth at bedtime.     warfarin 2.5 MG tablet  Commonly known as:  COUMADIN  Take 1 tablet (2.5 mg total) by mouth one time only at 6 PM.         Signed: Anastasio Auerbach. Carlosdaniel Grob   PA-C  12/07/2014, 8:57 AM

## 2014-12-07 NOTE — Clinical Social Work Placement (Signed)
   CLINICAL SOCIAL WORK PLACEMENT  NOTE  Date:  12/07/2014  Patient Details  Name: [name] MRN: [id number] Date of Birth: [birth year]  Clinical Social Work is seeking post-discharge placement for this patient at the Skilled  Nursing Facility level of care (*CSW will initial, date and re-position this form in  chart as items are completed):  No   Patient/family provided with [facility] Clinical Social Work Department's list of facilities offering this level of care within the geographic area requested by the patient (or if unable, by the patient's family).  No   Patient/family informed of their freedom to choose among providers that offer the needed level of care, that participate in Medicare, Medicaid or managed care program needed by the patient, have an available bed and are willing to accept the patient.  No   Patient/family informed of [facility]'s ownership interest in [facility] and [facility], as well as of the fact that they are under no obligation to receive care at these facilities.  PASRR submitted to EDS on 12/04/14     PASRR number received on 12/04/14     Existing PASRR number confirmed on       FL2 transmitted to all facilities in geographic area requested by pt/family on 12/05/14     FL2 transmitted to all facilities within larger geographic area on       Patient informed that his/her managed care company has contracts with or will negotiate with certain facilities, including the following:        Yes   Patient/family informed of bed offers received.  Patient chooses bed at [facility]     Physician recommends and patient chooses bed at      Patient to be transferred to [facility] on 12/07/14.  Patient to be transferred to facility by [facility]     Patient family notified on 12/07/14 of transfer.  Name of family member notified:  SON     PHYSICIAN       Additional Comment: Pt / son are in agreement with d/c to SNF today. [facility] transport  required. Pt's son is aware out of pocket costs may be associated with [facility] transport. NSG reviewed d/c summary, scripts, avs. Scripts included in d/c packet. D/c summary sent to SNF prior to d/c for review.    _______________________________________________ Royetta Asal, LCSW [phone] 12/07/2014, 2:03 PM

## 2014-12-08 ENCOUNTER — Non-Acute Institutional Stay (SKILLED_NURSING_FACILITY): Payer: Medicare Other | Admitting: Adult Health

## 2014-12-08 ENCOUNTER — Encounter: Payer: Self-pay | Admitting: Adult Health

## 2014-12-08 DIAGNOSIS — K5909 Other constipation: Secondary | ICD-10-CM

## 2014-12-08 DIAGNOSIS — K5900 Constipation, unspecified: Secondary | ICD-10-CM | POA: Diagnosis not present

## 2014-12-08 DIAGNOSIS — G629 Polyneuropathy, unspecified: Secondary | ICD-10-CM | POA: Diagnosis not present

## 2014-12-08 DIAGNOSIS — F32A Depression, unspecified: Secondary | ICD-10-CM

## 2014-12-08 DIAGNOSIS — I10 Essential (primary) hypertension: Secondary | ICD-10-CM

## 2014-12-08 DIAGNOSIS — J309 Allergic rhinitis, unspecified: Secondary | ICD-10-CM | POA: Diagnosis not present

## 2014-12-08 DIAGNOSIS — I480 Paroxysmal atrial fibrillation: Secondary | ICD-10-CM

## 2014-12-08 DIAGNOSIS — M1000 Idiopathic gout, unspecified site: Secondary | ICD-10-CM | POA: Diagnosis not present

## 2014-12-08 DIAGNOSIS — F329 Major depressive disorder, single episode, unspecified: Secondary | ICD-10-CM

## 2014-12-08 DIAGNOSIS — N183 Chronic kidney disease, stage 3 unspecified: Secondary | ICD-10-CM

## 2014-12-08 DIAGNOSIS — M1711 Unilateral primary osteoarthritis, right knee: Secondary | ICD-10-CM

## 2014-12-08 DIAGNOSIS — K219 Gastro-esophageal reflux disease without esophagitis: Secondary | ICD-10-CM

## 2014-12-08 DIAGNOSIS — F0390 Unspecified dementia without behavioral disturbance: Secondary | ICD-10-CM

## 2014-12-08 DIAGNOSIS — E785 Hyperlipidemia, unspecified: Secondary | ICD-10-CM | POA: Diagnosis not present

## 2014-12-08 DIAGNOSIS — D638 Anemia in other chronic diseases classified elsewhere: Secondary | ICD-10-CM

## 2014-12-08 DIAGNOSIS — Z7901 Long term (current) use of anticoagulants: Secondary | ICD-10-CM

## 2014-12-10 NOTE — Progress Notes (Addendum)
Patient ID: [name], female   DOB: 1932-09-15, 81 y.o.   MRN: [id number]    DATE:  12/08/14 MRN:  [id number]  BIRTHDAY: 1933/04/03  Facility:  Nursing Home Location:  [facility]  Nursing Home Room Number: 106-P  LEVEL OF CARE:  [phone]  Contact Information    Name Relation Home Work Whitney Son [phone]  [phone]   Valora Piccolo Daughter [phone]        Chief Complaint  Patient presents with  . Hospitalization Follow-up    Osteoarthritis S/P right total knee arthroplasty, atrial fibrillation, hypertension, allergic rhinitis, gout, chronic constipation, dementia, depression, GERD, hyperlipidemia, neuropathy, anemia and chronic kidney disease    HISTORY OF PRESENT ILLNESS:  This is an 81 year old female who was been admitted to [facility] on 12/07/14 from [facility]. She has PMH of syncope, chronic lower back pain, nonischemic cardiomyopathy, hypertension, hyperlipidemia, atrial fibrillation, history of stroke, PE (2005), anemia, dementia, GERD,  aortic stenosis, osteoarthritis, neuropathy, sleep apnea uses O2 at night and when necessary, chronic kidney disease and combined systolic and diastolic CHF. She has osteoarthritis and had right total knee arthroplasty on 12/04/14. She has been admitted for a short-term rehabilitation.  PAST MEDICAL HISTORY:  Past Medical History  Diagnosis Date  . Syncope   . Symptomatic bradycardia   . Chronic lower back pain   . Nonischemic cardiomyopathy   . HTN (hypertension)   . HLD (hyperlipidemia)   . Atrial fibrillation     tachy-brady syndrome with <1% recurrent PAF since pacemaker placement  . H/O: stroke   . Pulmonary embolism     HISTORY OF, the pt. had a recurrent bilateral pulmonary emboli in 2005, on warfarin therapy and at which time she under went implantation of IVC filter  . Anemia   . Dementia   . Gastroesophageal reflux disease   . Aortic stenosis 10/13/2012    Low  EF, low gradient with severe aortic stenosis confirmed by dobutamine stress echocardiogram s/p TAVR 12/2012  . PONV (postoperative nausea and vomiting)   . Osteoarthritis   . Neuropathy   . Spinal stenosis   . Symptomatic bradycardia 2012    s/p Medtronic PPM  . Chronic combined systolic and diastolic CHF (congestive heart failure)   . Fibromyalgia   . Presence of permanent cardiac pacemaker   . Sleep apnea     uses oxygen at night and PRN- not used since > 6 months   . Shortness of breath dyspnea     with exertion   . Pneumonia     hx of x 3   . Acute on chronic renal failure     sees Dr Allena Katz   . CKD (chronic kidney disease)   . Headache     hx of migraines   . On home oxygen therapy     patient uses at nite- 2L- has not used in > 6 months per patient     CURRENT MEDICATIONS: Reviewed  Patient's Medications  New Prescriptions   No medications on file  Previous Medications   ALENDRONATE (FOSAMAX) 70 MG TABLET    Take 70 mg by mouth once a week. Saturday.   AMIODARONE (PACERONE) 200 MG TABLET    Take 0.5 tablets (100 mg total) by mouth daily.   BIOTIN 5000 PO    Take 5,000 mcg by mouth every evening.    CALCITRIOL (ROCALTROL) 0.25 MCG CAPSULE    Take 0.25 mcg by mouth every morning.  CARVEDILOL (COREG) 12.5 MG TABLET    Take 1 tablet by mouth 2 (two) times daily.   CETIRIZINE (ZYRTEC) 10 MG TABLET    Take 10 mg by mouth every morning.    CHOLECALCIFEROL (VITAMIN D) 1000 UNITS CAPSULE    Take 2,000 Units by mouth at bedtime.    CYCLOSPORINE (RESTASIS) 0.05 % OPHTHALMIC EMULSION    Place 1 drop into both eyes 2 (two) times daily as needed (dry eyes.).   DOCUSATE SODIUM (COLACE) 50 MG CAPSULE    Take 50 mg by mouth 2 (two) times daily.    DONEPEZIL (ARICEPT) 10 MG TABLET    Take 10 mg by mouth at bedtime.    DULOXETINE (CYMBALTA) 60 MG CAPSULE    Take 60 mg by mouth every evening.    ESOMEPRAZOLE (NEXIUM) 40 MG CAPSULE    Take 40 mg by mouth daily before breakfast.      FERROUS SULFATE 325 (65 FE) MG TABLET    Take 1 tablet (325 mg total) by mouth 3 (three) times daily after meals.   HYDRALAZINE (APRESOLINE) 10 MG TABLET    Take 10 mg by mouth 3 (three) times daily.    HYDROCODONE-ACETAMINOPHEN (NORCO) 10-325 MG PER TABLET    Take 1-2 tablets by mouth every 4 (four) hours as needed.   LEVOTHYROXINE (SYNTHROID, LEVOTHROID) 100 MCG TABLET    Take 100 mcg by mouth every morning.    LINACLOTIDE (LINZESS) 145 MCG CAPS CAPSULE    Take 145 mcg by mouth every other day. Alternates with the 290 mg tablet in the am.   LINACLOTIDE 290 MCG CAPS    Take 290 mcg by mouth every other day. Alternates with the 145 mg tablets in the morning.   MEMANTINE (NAMENDA) 10 MG TABLET    Take 10 mg by mouth 2 (two) times daily.    MULTIPLE VITAMIN (MULTIVITAMIN) TABLET    Take 1 tablet by mouth every morning.    NITROGLYCERIN (NITROSTAT) 0.4 MG SL TABLET    Place 0.4 mg under the tongue every 5 (five) minutes as needed for chest pain (MAX 3 TABLETS).    NON FORMULARY    2 Liters oxygen as needed for shortness of breath   POLYETHYLENE GLYCOL (MIRALAX / GLYCOLAX) PACKET    Take 17 g by mouth 2 (two) times daily.   PRAVASTATIN (PRAVACHOL) 40 MG TABLET    Take 40 mg by mouth every evening.    PREGABALIN (LYRICA) 75 MG CAPSULE    Take 75 mg by mouth 2 (two) times daily.    TORSEMIDE (DEMADEX) 20 MG TABLET    Take 20 mg by mouth 2 (two) times daily. PEr Dr Allena Katz - patient to not take on 8/14 nor am of 8/15   TURMERIC 500 MG CAPS    Take 1 capsule by mouth 2 (two) times daily.   ULORIC 40 MG TABLET    Take 40 mg by mouth every evening.    VITAMIN B-12 (CYANOCOBALAMIN) 1000 MCG TABLET    Take 1,000 mcg by mouth 2 (two) times a week. Tuesday and Friday   WARFARIN (COUMADIN) 2.5 MG TABLET    Take 1 tablet (2.5 mg total) by mouth one time only at 6 PM.  Modified Medications   No medications on file  Discontinued Medications   COLCHICINE 0.6 MG TABLET    Take 0.6 mg by mouth every other day. PM    ENOXAPARIN (LOVENOX) 30 MG/0.3ML INJECTION    Inject 0.3 mLs (30  mg total) into the skin daily.     Allergies  Allergen Reactions  . Codeine Hives  . Darvocet [Propoxyphene N-Acetaminophen] Hives  . Nubain [Nalbuphine Hcl] Hives    Went into cardiac arrest      REVIEW OF SYSTEMS:  GENERAL: no change in appetite, no fatigue, no weight changes, no fever, chills or weakness EYES: Denies change in vision, dry eyes, eye pain, itching or discharge EARS: Denies change in hearing, ringing in ears, or earache NOSE: Denies nasal congestion or epistaxis MOUTH and THROAT: Denies oral discomfort, gingival pain or bleeding, pain from teeth or hoarseness   RESPIRATORY: no cough, SOB, DOE, wheezing, hemoptysis CARDIAC: no chest pain, edema or palpitations GI: no abdominal pain, diarrhea, constipation, heart burn, nausea or vomiting GU: Denies dysuria, frequency, hematuria, incontinence, or discharge PSYCHIATRIC: Denies feeling of depression or anxiety. No report of hallucinations, insomnia, paranoia, or agitation   PHYSICAL EXAMINATION  GENERAL APPEARANCE: Well nourished. In no acute distress. Obese SKIN:  Right knee has aquacel dressing, dry and no erythema HEAD: Normal in size and contour. No evidence of trauma EYES: Lids open and close normally. No blepharitis, entropion or ectropion. PERRL. Conjunctivae are clear and sclerae are white. Lenses are without opacity EARS: Pinnae are normal. Patient hears normal voice tunes of the examiner MOUTH and THROAT: Lips are without lesions. Oral mucosa is moist and without lesions. Tongue is normal in shape, size, and color and without lesions NECK: supple, trachea midline, no neck masses, no thyroid tenderness, no thyromegaly LYMPHATICS: no LAN in the neck, no supraclavicular LAN RESPIRATORY: breathing is even & unlabored, BS CTAB CARDIAC: RRR, no murmur,no extra heart sounds, no edema, right chest pacemaker GI: abdomen soft, normal BS, no masses, no  tenderness, no hepatomegaly, no splenomegaly EXTREMITIES:  Able to move 4 extremities NEUROLOGICAL: There is no tremor. Speech is clear. Hard of hearing PSYCHIATRIC: Alert and oriented X 3. Affect and behavior are appropriate  LABS/RADIOLOGY: Labs reviewed: Basic Metabolic Panel:  Recent Labs  16/10/96 0800 12/06/14 0450 12/07/14 0530  NA 138 136 133*  K 5.1 5.1 4.2  CL 102 102 99*  CO2 GLUCOSE 121* 128* 90  BUN 66* 84* 94*  CREATININE 3.16* 3.44* 3.37*  CALCIUM 8.0* 7.9* 7.5*   CBC:  Recent Labs  12/05/14 0800 12/06/14 0450 12/07/14 0530  WBC 10.3 10.9* 9.0  HGB 9.3* 8.1* 8.2*  HCT 27.8* 24.9* 25.5*  MCV 94.2 93.6 93.8  PLT PLATELET CLUMPS NOTED ON SMEAR, UNABLE TO ESTIMATE 156 170   CBG:  Recent Labs  12/07/14 1211  GLUCAP 144*     Dg Chest 2 View  11/29/2014   CLINICAL DATA:  Preop for right knee replacement.  EXAM: CHEST  2 VIEW  COMPARISON:  December 23, 2012.  FINDINGS: Stable cardiomediastinal silhouette. Status post aortic valve replacement. Right-sided pacemaker is unchanged in position. No pneumothorax or pleural effusion is noted. Mild multilevel degenerative disc disease is noted in the thoracic spine. No acute pulmonary disease is noted.  IMPRESSION: No active cardiopulmonary disease.   Electronically Signed   By: Lupita Raider, M.D.   On: 11/29/2014 15:51    ASSESSMENT/PLAN:  Osteoarthritis S/P right total knee arthroplasty - for rehabilitation; continue Coumadin for DVT prophylaxis;; RLE WBAT; continue Norco 10/325 mg 1-2 tabs by mouth every 4 hours when necessary for pain; follow-up with Dr. Charlann Boxer, orthopedic surgeon, in 2 weeks  Atrial fibrillation - rate controlled; continue amiodarone 100 mg PO daily  Hypertension - continue Coreg 12.5 mg 1 tab by mouth twice a day and hydralazine 10 mg 1 tab by mouth 3 times a day  Allergic rhinitis - continue Zyrtec 10 mg 1 tab by mouth every morning  Gout - discontinue colchicine due to  creatinine of 3.37; continue U Lori 40 mg 1 tab by mouth every evening  Chronic constipation - continue Colace 50 mg by mouth twice a day and Linzess 145 g 1 capsule by mouth every other day alternating with 219 g capsule   Dementia - continue Aricept 10 mg 1 tab by mouth daily at bedtime and Namenda 10 mg 1 tab by mouth twice a day  Depression - continue Cymbalta 60 mg 1 capsule by mouth daily evening  GERD - continue Nexium 40 mg 1 capsule by mouth daily   Hyperlipidemia - continue Pravachol 40 mg 1 tab by mouth daily evening  Neuropathy - continue Lyrica 75 mg 1 capsule by mouth twice a day  Anemia of chronic disease - hemoglobin 8.2; check CBC  Chronic kidney disease, stage III - creatinine 3.37; check CMP  Long-term use of anticoagulant - INR  2.0, therapeutic; discontinue Lovenox and continue Coumadin 2.5 mg daily; repeat INR on 12/12/14    Goals of care:  Short-term rehabilitation    [facility], NP [facility] [phone]

## 2014-12-11 ENCOUNTER — Non-Acute Institutional Stay (SKILLED_NURSING_FACILITY): Payer: Medicare Other | Admitting: Internal Medicine

## 2014-12-11 DIAGNOSIS — I5032 Chronic diastolic (congestive) heart failure: Secondary | ICD-10-CM

## 2014-12-11 DIAGNOSIS — K219 Gastro-esophageal reflux disease without esophagitis: Secondary | ICD-10-CM | POA: Diagnosis not present

## 2014-12-11 DIAGNOSIS — N183 Chronic kidney disease, stage 3 (moderate): Secondary | ICD-10-CM | POA: Diagnosis not present

## 2014-12-11 DIAGNOSIS — I10 Essential (primary) hypertension: Secondary | ICD-10-CM | POA: Diagnosis not present

## 2014-12-11 DIAGNOSIS — I480 Paroxysmal atrial fibrillation: Secondary | ICD-10-CM | POA: Diagnosis not present

## 2014-12-11 DIAGNOSIS — Z7901 Long term (current) use of anticoagulants: Secondary | ICD-10-CM | POA: Diagnosis not present

## 2014-12-11 DIAGNOSIS — G629 Polyneuropathy, unspecified: Secondary | ICD-10-CM

## 2014-12-11 DIAGNOSIS — R1310 Dysphagia, unspecified: Secondary | ICD-10-CM

## 2014-12-11 DIAGNOSIS — D62 Acute posthemorrhagic anemia: Secondary | ICD-10-CM | POA: Diagnosis not present

## 2014-12-11 DIAGNOSIS — R2681 Unsteadiness on feet: Secondary | ICD-10-CM

## 2014-12-11 DIAGNOSIS — M1711 Unilateral primary osteoarthritis, right knee: Secondary | ICD-10-CM

## 2014-12-11 DIAGNOSIS — F0390 Unspecified dementia without behavioral disturbance: Secondary | ICD-10-CM

## 2014-12-11 DIAGNOSIS — M1A9XX0 Chronic gout, unspecified, without tophus (tophi): Secondary | ICD-10-CM

## 2014-12-11 DIAGNOSIS — K5900 Constipation, unspecified: Secondary | ICD-10-CM

## 2014-12-11 DIAGNOSIS — K5909 Other constipation: Secondary | ICD-10-CM

## 2014-12-12 ENCOUNTER — Non-Acute Institutional Stay (SKILLED_NURSING_FACILITY): Payer: Medicare Other | Admitting: Adult Health

## 2014-12-12 ENCOUNTER — Encounter: Payer: Self-pay | Admitting: Adult Health

## 2014-12-12 ENCOUNTER — Other Ambulatory Visit: Payer: Self-pay | Admitting: *Deleted

## 2014-12-12 DIAGNOSIS — I480 Paroxysmal atrial fibrillation: Secondary | ICD-10-CM | POA: Diagnosis not present

## 2014-12-12 DIAGNOSIS — Z7901 Long term (current) use of anticoagulants: Secondary | ICD-10-CM

## 2014-12-12 MED ORDER — HYDROCODONE-ACETAMINOPHEN 10-325 MG PO TABS: ORAL_TABLET | ORAL | Status: DC

## 2014-12-12 MED ORDER — HYDROCODONE-ACETAMINOPHEN 5-325 MG PO TABS: ORAL_TABLET | ORAL | Status: DC

## 2014-12-12 NOTE — Progress Notes (Signed)
Patient ID: [name], female   DOB: 1932/12/22, 81 y.o.   MRN: [id number]      [facility]   PCP: Pearson Grippe, MD  Code Status: full code  Allergies  Allergen Reactions  . Codeine Hives  . Darvocet [Propoxyphene N-Acetaminophen] Hives  . Nubain [Nalbuphine Hcl] Hives    Went into cardiac arrest     Chief Complaint  Patient presents with  . New Admit To SNF     HPI:  81 y.o. patient is here for short term rehabilitation post hospital admission with right knee OA. She underwent right total knee arthroplasty. Her pain medication has not been helpful and she has been working with therapy team. She would like her pain medication scheduled. Her reflux is bothering her. She also has been short of breath with exertion and her bowel movement has slowed down. She has PMH of syncope, chronic lower back pain, nonischemic cardiomyopathy, hypertension, hyperlipidemia, atrial fibrillation among others.  Review of Systems:  Constitutional: Negative for fever, chills, diaphoresis.  HENT: Negative for headache, congestion, nasal discharge. Is hard of hearing Eyes: Negative for eye pain, blurred vision, double vision and discharge.  Respiratory: Negative for cough, wheezing.   Cardiovascular: Negative for chest pain, palpitations, leg swelling.  Gastrointestinal: Negative for heartburn, nausea, vomiting, abdominal pain. Bowel movement yesterday Genitourinary: Negative for dysuria Musculoskeletal: Negative for back pain, falls Skin: Negative for itching, rash.  Neurological: Negative for dizziness, tingling, focal weakness Psychiatric/Behavioral: Negative for depression   Past Medical History  Diagnosis Date  . Syncope   . Symptomatic bradycardia   . Chronic lower back pain   . Nonischemic cardiomyopathy   . HTN (hypertension)   . HLD (hyperlipidemia)   . Atrial fibrillation     tachy-brady syndrome with <1% recurrent PAF since pacemaker  placement  . H/O: stroke   . Pulmonary embolism     HISTORY OF, the pt. had a recurrent bilateral pulmonary emboli in 2005, on warfarin therapy and at which time she under went implantation of IVC filter  . Anemia   . Dementia   . Gastroesophageal reflux disease   . Aortic stenosis 10/13/2012    Low EF, low gradient with severe aortic stenosis confirmed by dobutamine stress echocardiogram s/p TAVR 12/2012  . PONV (postoperative nausea and vomiting)   . Osteoarthritis   . Neuropathy   . Spinal stenosis   . Symptomatic bradycardia 2012    s/p Medtronic PPM  . Chronic combined systolic and diastolic CHF (congestive heart failure)   . Fibromyalgia   . Presence of permanent cardiac pacemaker   . Sleep apnea     uses oxygen at night and PRN- not used since > 6 months   . Shortness of breath dyspnea     with exertion   . Pneumonia     hx of x 3   . Acute on chronic renal failure     sees Dr Allena Katz   . CKD (chronic kidney disease)   . Headache     hx of migraines   . On home oxygen therapy     patient uses at nite- 2L- has not used in > 6 months per patient   Past Surgical History  Procedure Laterality Date  . Pacemaker insertion      Medtronic  . Transcatheter aortic valve replacement, transfemoral  12/21/2012    a. 35mm Edwards Sapien XT transcatheter heart valve placed via open left transfemoral approach b. Intra-op TEE:  well-seated bioprosthetic aortic valve with mean gradient 2 mmHg, trivial AI, mild MR, EF 30-35%  . Transcatheter aortic valve replacement, transfemoral N/A 12/21/2012    Procedure: TRANSCATHETER AORTIC VALVE REPLACEMENT, TRANSFEMORAL;  Surgeon: Tonny Bollman, MD;  Location: [facility] OR;  Service: Open Heart Surgery;  Laterality: N/A;  . Intraoperative transesophageal echocardiogram N/A 12/21/2012    Procedure: INTRAOPERATIVE TRANSESOPHAGEAL ECHOCARDIOGRAM;  Surgeon: Tonny Bollman, MD;  Location: [facility] OR;  Service: Open Heart Surgery;  Laterality: N/A;  . Central venous  catheter insertion Left 12/21/2012    Procedure: INSERTION CENTRAL LINE ADULT;  Surgeon: Tonny Bollman, MD;  Location: [facility] OR;  Service: Open Heart Surgery;  Laterality: Left;  . Cataract extraction    . Bunionectomy    . Appendectomy    . Abdominal hysterectomy    . Thyroidectomy, partial    . Back surgery    . Knee surgery Left   . Nose surgery      X 2  . Left and right heart catheterization with coronary angiogram N/A 10/18/2012    Procedure: LEFT AND RIGHT HEART CATHETERIZATION WITH CORONARY ANGIOGRAM;  Surgeon: Tonny Bollman, MD;  Location: [facility] CATH LAB;  Service: Cardiovascular;  Laterality: N/A;  . Nasal septum surgery    . Total knee arthroplasty Right 12/04/2014    Procedure: RIGHT TOTAL  KNEE ARTHROPLASTY;  Surgeon: Durene Romans, MD;  Location: [facility] ORS;  Service: Orthopedics;  Laterality: Right;   Social History:   reports that she has never smoked. She has never used smokeless tobacco. She reports that she does not drink alcohol or use illicit drugs.  Family History  Problem Relation Age of Onset  . Ovarian cancer Mother     Deceased  . Epilepsy Father     Deceased    Medications:   Medication List       This list is accurate as of: 12/11/14 11:59 PM.  Always use your most recent med list.               alendronate 70 MG tablet  Commonly known as:  FOSAMAX  Take 70 mg by mouth once a week. Saturday.     amiodarone 200 MG tablet  Commonly known as:  PACERONE  Take 0.5 tablets (100 mg total) by mouth daily.     BIOTIN 5000 PO  Take 5,000 mcg by mouth every evening.     calcitRIOL 0.25 MCG capsule  Commonly known as:  ROCALTROL  Take 0.25 mcg by mouth every morning.     carvedilol 12.5 MG tablet  Commonly known as:  COREG  Take 1 tablet by mouth 2 (two) times daily.     cetirizine 10 MG tablet  Commonly known as:  ZYRTEC  Take 10 mg by mouth every morning.     cycloSPORINE 0.05 % ophthalmic emulsion  Commonly known as:  RESTASIS  Place 1 drop into  both eyes 2 (two) times daily as needed (dry eyes.).     docusate sodium 50 MG capsule  Commonly known as:  COLACE  Take 50 mg by mouth 2 (two) times daily.     donepezil 10 MG tablet  Commonly known as:  ARICEPT  Take 10 mg by mouth at bedtime.     DULoxetine 60 MG capsule  Commonly known as:  CYMBALTA  Take 60 mg by mouth every evening.     esomeprazole 40 MG capsule  Commonly known as:  NEXIUM  Take 40 mg by mouth daily before breakfast.  ferrous sulfate 325 (65 FE) MG tablet  Take 1 tablet (325 mg total) by mouth 3 (three) times daily after meals.     hydrALAZINE 10 MG tablet  Commonly known as:  APRESOLINE  Take 10 mg by mouth 3 (three) times daily.     levothyroxine 100 MCG tablet  Commonly known as:  SYNTHROID, LEVOTHROID  Take 100 mcg by mouth every morning.     LINZESS 145 MCG Caps capsule  Generic drug:  Linaclotide  Take 145 mcg by mouth every other day. Alternates with the 290 mg tablet in the am.     Linaclotide 290 MCG Caps capsule  Commonly known as:  LINZESS  Take 290 mcg by mouth every other day. Alternates with the 145 mg tablets in the morning.     memantine 10 MG tablet  Commonly known as:  NAMENDA  Take 10 mg by mouth 2 (two) times daily.     multivitamin tablet  Take 1 tablet by mouth every morning.     nitroGLYCERIN 0.4 MG SL tablet  Commonly known as:  NITROSTAT  Place 0.4 mg under the tongue every 5 (five) minutes as needed for chest pain (MAX 3 TABLETS).     NON FORMULARY  2 Liters oxygen as needed for shortness of breath     polyethylene glycol packet  Commonly known as:  MIRALAX / GLYCOLAX  Take 17 g by mouth 2 (two) times daily.     pravastatin 40 MG tablet  Commonly known as:  PRAVACHOL  Take 40 mg by mouth every evening.     pregabalin 75 MG capsule  Commonly known as:  LYRICA  Take 75 mg by mouth 2 (two) times daily.     torsemide 20 MG tablet  Commonly known as:  DEMADEX  Take 20 mg by mouth 2 (two) times daily. PEr  Dr Allena Katz - patient to not take on 8/14 nor am of 8/15     Turmeric 500 MG Caps  Take 1 capsule by mouth 2 (two) times daily.     ULORIC 40 MG tablet  Generic drug:  febuxostat  Take 40 mg by mouth every evening.     vitamin B-12 1000 MCG tablet  Commonly known as:  CYANOCOBALAMIN  Take 1,000 mcg by mouth 2 (two) times a week. Tuesday and Friday     Vitamin D 1000 UNITS capsule  Take 2,000 Units by mouth at bedtime.     warfarin 2.5 MG tablet  Commonly known as:  COUMADIN  Take 1 tablet (2.5 mg total) by mouth one time only at 6 PM.         Physical Exam: Filed Vitals:   12/11/14 1738  BP: 148/60  Pulse: 72  Temp: 97.4 F (36.3 C)  Resp: 18  SpO2: 97%    General- elderly female, in no acute distress Head- normocephalic, atraumatic Throat- moist mucus membrane Eyes- no pallor, no icterus, no discharge, normal conjunctiva, normal sclera Neck- no cervical lymphadenopathy Cardiovascular- normal s1,s2, no murmurs, right leg 2+ leg edema Respiratory- bilateral clear to auscultation, +  wheeze, no rhonchi, no crackles, no use of accessory muscles Abdomen- bowel sounds present, soft, non tender Musculoskeletal- able to move all 4 extremities, right knee limited range of motion  Neurological- no focal deficit, alert and oriented  Skin- warm and dry, right knee surgical incision with aquacel dressing, bruise to right leg Psychiatry- normal mood and affect    Labs reviewed: Basic Metabolic Panel:  Recent Labs  12/05/14 0800 12/06/14 0450 12/07/14 0530  NA 138 136 133*  K 5.1 5.1 4.2  CL 102 102 99*  CO2 GLUCOSE 121* 128* 90  BUN 66* 84* 94*  CREATININE 3.16* 3.44* 3.37*  CALCIUM 8.0* 7.9* 7.5*   Liver Function Tests: No results for input(s): AST, ALT, ALKPHOS, BILITOT, PROT, ALBUMIN in the last 8760 hours. No results for input(s): LIPASE, AMYLASE in the last 8760 hours. No results for input(s): AMMONIA in the last 8760 hours. CBC:  Recent Labs   12/05/14 0800 12/06/14 0450 12/07/14 0530  WBC 10.3 10.9* 9.0  HGB 9.3* 8.1* 8.2*  HCT 27.8* 24.9* 25.5*  MCV 94.2 93.6 93.8  PLT PLATELET CLUMPS NOTED ON SMEAR, UNABLE TO ESTIMATE 156 170   Cardiac Enzymes: No results for input(s): CKTOTAL, CKMB, CKMBINDEX, TROPONINI in the last 8760 hours. BNP: Invalid input(s): POCBNP CBG:  Recent Labs  12/07/14 1211  GLUCAP 144*    Radiological Exams: Dg Chest 2 View  11/29/2014   CLINICAL DATA:  Preop for right knee replacement.  EXAM: CHEST  2 VIEW  COMPARISON:  December 23, 2012.  FINDINGS: Stable cardiomediastinal silhouette. Status post aortic valve replacement. Right-sided pacemaker is unchanged in position. No pneumothorax or pleural effusion is noted. Mild multilevel degenerative disc disease is noted in the thoracic spine. No acute pulmonary disease is noted.  IMPRESSION: No active cardiopulmonary disease.   Electronically Signed   By: Lupita Raider, M.D.   On: 11/29/2014 15:51    Assessment/Plan  Unsteady gait Post recent knee surgery. Will have her work with physical therapy and occupational therapy team to help with gait training and muscle strengthening exercises.fall precautions. Skin care. Encourage to be out of bed.   Right knee OA S/P right total knee arthroplasty. RLE WBAT. On norco 10/325 mg 1-2 tab q4h prn for pain. With her pain not being controlled, d/c current norco order and start norco 10-325 mg bid with 5-325 mg q6h prn breakthrough pain. Has follow up with Dr Charlann Boxer. Continue coumadin for dvt prophylaxis with goal inr 2-3. To work with therapy team as tolerated. Advised on ted stocking for right leg edema.  Blood loss anemia Post op, monitor h&h and continue ferrous sulfate 325 mg tid  gerd Increase nexium to 40 mg bid for a week and reassess symptom  Long term anticoagulation inr 12/08/14 reviewed, currently on coumadin 2.5 mg daily, recheck inr 12/12/14. inr goal 2-3  Chronic constipation Currently on Colace  50 mg bid, Linzess 145 g daily and miralax started recently. Advised to increase fiber intake, monitor clinically. Encouraged hydration   Atrial fibrillation rate controlled. continue coreg 12.5 mg bid and amiodarone 100 mg daily and coumadin for anticoagulation with goal inr 2-3  Hypertension sbp elevated. Monitor bp. Continue hydralazine 10 mg tid with coreg 12.5 mg bid for now  Dysphagia To be followed by SLP team, continue dysphagia 3 diet for now with aspiration precautions  ckd stage 3 Monitor renal function  Gout  Stable, continue uloric 40 mg daily and monitor  Dementia Continue aricept with namenda and monitor, asistance with ADLs as needed  CHF Appears euvolemic. Continue torsemide, coreg and hydralazine.monitor bmp  Neuropathy Continue Lyrica 75 mg bid and cymbalta 60 mg daily   Goals of care: short term rehabilitation   Labs/tests ordered: cbc, cmp  Family/ staff Communication: reviewed care plan with patient and nursing supervisor    Oneal Grout, MD  [facility] [phone] (Monday-Friday 8 am -  5 pm) [phone] (afterhours)

## 2014-12-12 NOTE — Telephone Encounter (Signed)
[facility] 

## 2014-12-13 NOTE — Progress Notes (Signed)
Patient ID: [name], female   DOB: 07-29-1932, 81 y.o.   MRN: [id number] Subjective:     Indication: atrial fibrillation Bleeding signs/symptoms: None Thromboembolic signs/symptoms: None  Missed Coumadin doses: 3 Medication changes: no Dietary changes: no Bacterial/viral infection: no Other concerns: None   Review of Systems A comprehensive review of systems was negative.   Objective:    INR Today:  1.1 Current dose:   Coumadin 2.5 mg daily  Assessment:    Subtherapeutic INR for goal of 2-3   Plan:    1. New dose: increase Coumadin to 5 mg daily  And continue Lovenox 30 mg SQ daily 2. Next INR:   12/15/14

## 2014-12-14 ENCOUNTER — Observation Stay (HOSPITAL_COMMUNITY)
Admission: EM | Admit: 2014-12-14 | Discharge: 2014-12-15 | Disposition: A | Discharge status: Skilled Nursing Facility | Payer: Medicare Other | Attending: Internal Medicine | Admitting: Internal Medicine

## 2014-12-14 ENCOUNTER — Encounter (HOSPITAL_COMMUNITY): Payer: Self-pay | Admitting: Nurse Practitioner

## 2014-12-14 ENCOUNTER — Emergency Department (HOSPITAL_COMMUNITY): Payer: Medicare Other

## 2014-12-14 DIAGNOSIS — Z8673 Personal history of transient ischemic attack (TIA), and cerebral infarction without residual deficits: Secondary | ICD-10-CM | POA: Diagnosis not present

## 2014-12-14 DIAGNOSIS — I5042 Chronic combined systolic (congestive) and diastolic (congestive) heart failure: Secondary | ICD-10-CM | POA: Diagnosis not present

## 2014-12-14 DIAGNOSIS — Z7983 Long term (current) use of bisphosphonates: Secondary | ICD-10-CM | POA: Insufficient documentation

## 2014-12-14 DIAGNOSIS — I129 Hypertensive chronic kidney disease with stage 1 through stage 4 chronic kidney disease, or unspecified chronic kidney disease: Secondary | ICD-10-CM | POA: Insufficient documentation

## 2014-12-14 DIAGNOSIS — Z86711 Personal history of pulmonary embolism: Secondary | ICD-10-CM | POA: Diagnosis not present

## 2014-12-14 DIAGNOSIS — N183 Chronic kidney disease, stage 3 unspecified: Secondary | ICD-10-CM | POA: Diagnosis present

## 2014-12-14 DIAGNOSIS — Z79899 Other long term (current) drug therapy: Secondary | ICD-10-CM | POA: Diagnosis not present

## 2014-12-14 DIAGNOSIS — R1310 Dysphagia, unspecified: Secondary | ICD-10-CM

## 2014-12-14 DIAGNOSIS — K219 Gastro-esophageal reflux disease without esophagitis: Secondary | ICD-10-CM | POA: Diagnosis not present

## 2014-12-14 DIAGNOSIS — I10 Essential (primary) hypertension: Secondary | ICD-10-CM | POA: Diagnosis not present

## 2014-12-14 DIAGNOSIS — R05 Cough: Secondary | ICD-10-CM | POA: Diagnosis not present

## 2014-12-14 DIAGNOSIS — Z950 Presence of cardiac pacemaker: Secondary | ICD-10-CM | POA: Insufficient documentation

## 2014-12-14 DIAGNOSIS — T17908A Unspecified foreign body in respiratory tract, part unspecified causing other injury, initial encounter: Secondary | ICD-10-CM | POA: Diagnosis present

## 2014-12-14 DIAGNOSIS — R061 Stridor: Secondary | ICD-10-CM | POA: Diagnosis not present

## 2014-12-14 DIAGNOSIS — E039 Hypothyroidism, unspecified: Secondary | ICD-10-CM | POA: Insufficient documentation

## 2014-12-14 DIAGNOSIS — F0390 Unspecified dementia without behavioral disturbance: Secondary | ICD-10-CM | POA: Insufficient documentation

## 2014-12-14 DIAGNOSIS — D649 Anemia, unspecified: Secondary | ICD-10-CM | POA: Insufficient documentation

## 2014-12-14 DIAGNOSIS — Z952 Presence of prosthetic heart valve: Secondary | ICD-10-CM | POA: Insufficient documentation

## 2014-12-14 DIAGNOSIS — T17928A Food in respiratory tract, part unspecified causing other injury, initial encounter: Secondary | ICD-10-CM

## 2014-12-14 DIAGNOSIS — D638 Anemia in other chronic diseases classified elsewhere: Secondary | ICD-10-CM

## 2014-12-14 DIAGNOSIS — I428 Other cardiomyopathies: Secondary | ICD-10-CM | POA: Insufficient documentation

## 2014-12-14 DIAGNOSIS — I495 Sick sinus syndrome: Secondary | ICD-10-CM | POA: Insufficient documentation

## 2014-12-14 DIAGNOSIS — G4730 Sleep apnea, unspecified: Secondary | ICD-10-CM | POA: Diagnosis not present

## 2014-12-14 DIAGNOSIS — M109 Gout, unspecified: Secondary | ICD-10-CM | POA: Insufficient documentation

## 2014-12-14 DIAGNOSIS — T17998A Other foreign object in respiratory tract, part unspecified causing other injury, initial encounter: Secondary | ICD-10-CM

## 2014-12-14 DIAGNOSIS — Z79891 Long term (current) use of opiate analgesic: Secondary | ICD-10-CM | POA: Insufficient documentation

## 2014-12-14 DIAGNOSIS — I4891 Unspecified atrial fibrillation: Secondary | ICD-10-CM | POA: Diagnosis not present

## 2014-12-14 DIAGNOSIS — J690 Pneumonitis due to inhalation of food and vomit: Principal | ICD-10-CM

## 2014-12-14 DIAGNOSIS — Z7901 Long term (current) use of anticoagulants: Secondary | ICD-10-CM | POA: Insufficient documentation

## 2014-12-14 DIAGNOSIS — M797 Fibromyalgia: Secondary | ICD-10-CM | POA: Insufficient documentation

## 2014-12-14 DIAGNOSIS — M1990 Unspecified osteoarthritis, unspecified site: Secondary | ICD-10-CM | POA: Diagnosis not present

## 2014-12-14 DIAGNOSIS — R0901 Asphyxia: Secondary | ICD-10-CM | POA: Diagnosis not present

## 2014-12-14 DIAGNOSIS — T17920A Food in respiratory tract, part unspecified causing asphyxiation, initial encounter: Secondary | ICD-10-CM

## 2014-12-14 DIAGNOSIS — E785 Hyperlipidemia, unspecified: Secondary | ICD-10-CM | POA: Insufficient documentation

## 2014-12-14 LAB — BASIC METABOLIC PANEL
ANION GAP: 11 (ref 5–15)
BUN: 77 mg/dL — ABNORMAL HIGH (ref 6–20)
CALCIUM: 9.6 mg/dL (ref 8.9–10.3)
CO2: 33 mmol/L — AB (ref 22–32)
CREATININE: 2.63 mg/dL — AB (ref 0.44–1.00)
Chloride: 97 mmol/L — ABNORMAL LOW (ref 101–111)
GFR, EST AFRICAN AMERICAN: 19 mL/min — AB (ref >60)
GFR, EST NON AFRICAN AMERICAN: 16 mL/min — AB (ref >60)
Glucose, Bld: 116 mg/dL — ABNORMAL HIGH (ref 65–99)
Potassium: 4.3 mmol/L (ref 3.5–5.1)
SODIUM: 141 mmol/L (ref 135–145)

## 2014-12-14 LAB — CBC WITH DIFFERENTIAL/PLATELET
BASOS ABS: 0 10*3/uL (ref 0.0–0.1)
BASOS PCT: 1 % (ref 0–1)
EOS ABS: 0.2 10*3/uL (ref 0.0–0.7)
Eosinophils Relative: 2 % (ref 0–5)
HCT: 25.7 % — ABNORMAL LOW (ref 36.0–46.0)
HEMOGLOBIN: 8.3 g/dL — AB (ref 12.0–15.0)
Lymphocytes Relative: 24 % (ref 12–46)
Lymphs Abs: 2 10*3/uL (ref 0.7–4.0)
MCH: 31 pg (ref 26.0–34.0)
MCHC: 32.3 g/dL (ref 30.0–36.0)
MCV: 95.9 fL (ref 78.0–100.0)
MONOS PCT: 11 % (ref 3–12)
Monocytes Absolute: 1 10*3/uL (ref 0.1–1.0)
NEUTROS PCT: 62 % (ref 43–77)
Neutro Abs: 5.2 10*3/uL (ref 1.7–7.7)
Platelets: 269 10*3/uL (ref 150–400)
RBC: 2.68 MIL/uL — ABNORMAL LOW (ref 3.87–5.11)
RDW: 15.8 % — ABNORMAL HIGH (ref 11.5–15.5)
WBC: 8.4 10*3/uL (ref 4.0–10.5)

## 2014-12-14 LAB — PROTIME-INR
INR: 1.05 (ref 0.00–1.49)
PROTHROMBIN TIME: 13.9 s (ref 11.6–15.2)

## 2014-12-14 MED ORDER — AMOXICILLIN-POT CLAVULANATE 875-125 MG PO TABS: 1.0000 | ORAL_TABLET | Freq: Once | ORAL | Status: DC

## 2014-12-14 MED ORDER — HYDROCODONE-ACETAMINOPHEN 5-325 MG PO TABS
2.0000 | ORAL_TABLET | Freq: Once | ORAL | Status: DC
  Filled 2014-12-14: qty 2

## 2014-12-14 MED ORDER — IPRATROPIUM-ALBUTEROL 0.5-2.5 (3) MG/3ML IN SOLN
3.0000 mL | Freq: Once | RESPIRATORY_TRACT | Status: AC
  Administered 2014-12-14: 3 mL via RESPIRATORY_TRACT
  Filled 2014-12-14: qty 3

## 2014-12-14 MED ORDER — LEVOFLOXACIN IN D5W 500 MG/100ML IV SOLN
500.0000 mg | Freq: Once | INTRAVENOUS | Status: AC
  Administered 2014-12-14: 500 mg via INTRAVENOUS
  Filled 2014-12-14: qty 100

## 2014-12-14 NOTE — ED Notes (Signed)
Bed: ZW25 Expected date:  Expected time:  Means of arrival:  Comments: EMS 81 yo female from [place]/food stuck in throat

## 2014-12-14 NOTE — ED Notes (Signed)
Patient transported to CT 

## 2014-12-14 NOTE — ED Provider Notes (Signed)
CSN: [id number]     Arrival date & time 12/14/14  1929 History   [id number] Contact with Patient 12/14/14 1940     Chief Complaint  Patient presents with  . Food Stuck in the Throat    . Aspiration     (Consider location/radiation/quality/duration/timing/severity/associated sxs/prior Treatment) HPI Patient was eating cornbread at about 5:30. She reports that she got choked on it. She has been having harsh coughing ever since. She reports at about 6:30 she was able to cough about 2 pieces out. She however continues to have severe coughing proximal systems and feels like there something in her chest. She reports several times in the past she's had some problems with choking on food but it is never been this severe. She has always been able to cough it out on her own. The patient reports that she was at her usual state of health before this occurred. She has not been having fever or episodes of chest pain or atypical shortness of breath. She is currently at an nursing facility doing rehabilitation after a knee replacement. Past Medical History  Diagnosis Date  . Syncope   . Symptomatic bradycardia   . Chronic lower back pain   . Nonischemic cardiomyopathy   . HTN (hypertension)   . HLD (hyperlipidemia)   . Atrial fibrillation     tachy-brady syndrome with <1% recurrent PAF since pacemaker placement  . H/O: stroke   . Pulmonary embolism     HISTORY OF, the pt. had a recurrent bilateral pulmonary emboli in 2005, on warfarin therapy and at which time she under went implantation of IVC filter  . Anemia   . Dementia   . Gastroesophageal reflux disease   . Aortic stenosis 10/13/2012    Low EF, low gradient with severe aortic stenosis confirmed by dobutamine stress echocardiogram s/p TAVR 12/2012  . PONV (postoperative nausea and vomiting)   . Osteoarthritis   . Neuropathy   . Spinal stenosis   . Symptomatic bradycardia 2012    s/p Medtronic PPM  . Chronic combined systolic and  diastolic CHF (congestive heart failure)   . Fibromyalgia   . Presence of permanent cardiac pacemaker   . Sleep apnea     uses oxygen at night and PRN- not used since > 6 months   . Shortness of breath dyspnea     with exertion   . Pneumonia     hx of x 3   . Acute on chronic renal failure     sees Dr Allena Katz   . CKD (chronic kidney disease)   . Headache     hx of migraines   . On home oxygen therapy     patient uses at nite- 2L- has not used in > 6 months per patient   Past Surgical History  Procedure Laterality Date  . Pacemaker insertion      Medtronic  . Transcatheter aortic valve replacement, transfemoral  12/21/2012    a. 29mm Edwards Sapien XT transcatheter heart valve placed via open left transfemoral approach b. Intra-op TEE: well-seated bioprosthetic aortic valve with mean gradient 2 mmHg, trivial AI, mild MR, EF 30-35%  . Transcatheter aortic valve replacement, transfemoral N/A 12/21/2012    Procedure: TRANSCATHETER AORTIC VALVE REPLACEMENT, TRANSFEMORAL;  Surgeon: Tonny Bollman, MD;  Location: [facility] OR;  Service: Open Heart Surgery;  Laterality: N/A;  . Intraoperative transesophageal echocardiogram N/A 12/21/2012    Procedure: INTRAOPERATIVE TRANSESOPHAGEAL ECHOCARDIOGRAM;  Surgeon: Tonny Bollman, MD;  Location: [facility] OR;  Service: Open Heart Surgery;  Laterality: N/A;  . Central venous catheter insertion Left 12/21/2012    Procedure: INSERTION CENTRAL LINE ADULT;  Surgeon: Tonny Bollman, MD;  Location: [facility] OR;  Service: Open Heart Surgery;  Laterality: Left;  . Cataract extraction    . Bunionectomy    . Appendectomy    . Abdominal hysterectomy    . Thyroidectomy, partial    . Back surgery    . Knee surgery Left   . Nose surgery      X 2  . Left and right heart catheterization with coronary angiogram N/A 10/18/2012    Procedure: LEFT AND RIGHT HEART CATHETERIZATION WITH CORONARY ANGIOGRAM;  Surgeon: Tonny Bollman, MD;  Location: [facility] CATH LAB;  Service: Cardiovascular;  Laterality:  N/A;  . Nasal septum surgery    . Total knee arthroplasty Right 12/04/2014    Procedure: RIGHT TOTAL  KNEE ARTHROPLASTY;  Surgeon: Durene Romans, MD;  Location: [facility] ORS;  Service: Orthopedics;  Laterality: Right;   Family History  Problem Relation Age of Onset  . Ovarian cancer Mother     Deceased  . Epilepsy Father     Deceased   Social History  Substance Use Topics  . Smoking status: Never Smoker   . Smokeless tobacco: Never Used  . Alcohol Use: No   OB History    No data available     Review of Systems 10 Systems reviewed and are negative for acute change except as noted in the HPI.    Allergies  Codeine; Darvocet; and Nubain  Home Medications   Prior to Admission medications   Medication Sig Start Date End Date Taking? Authorizing Provider  alendronate (FOSAMAX) 70 MG tablet Take 70 mg by mouth once a week. Saturday.   Yes Historical Provider, MD  amiodarone (PACERONE) 200 MG tablet Take 0.5 tablets (100 mg total) by mouth daily. Patient taking differently: Take 100 mg by mouth every morning.  01/07/13  Yes Rosalio Macadamia, NP  BIOTIN 5000 PO Take 5,000 mcg by mouth every evening.    Yes Historical Provider, MD  calcitRIOL (ROCALTROL) 0.25 MCG capsule Take 0.25 mcg by mouth every morning.    Yes Historical Provider, MD  carvedilol (COREG) 12.5 MG tablet Take 1 tablet by mouth 2 (two) times daily. 07/20/14  Yes Historical Provider, MD  cetirizine (ZYRTEC) 10 MG tablet Take 10 mg by mouth every morning.    Yes Historical Provider, MD  Cholecalciferol (VITAMIN D) 1000 UNITS capsule Take 2,000 Units by mouth at bedtime.    Yes Historical Provider, MD  cycloSPORINE (RESTASIS) 0.05 % ophthalmic emulsion Place 1 drop into both eyes 2 (two) times daily as needed (dry eyes.).   Yes Historical Provider, MD  docusate sodium (COLACE) 50 MG capsule Take 50 mg by mouth 2 (two) times daily.    Yes Historical Provider, MD  donepezil (ARICEPT) 10 MG tablet Take 10 mg by mouth at bedtime.     Yes Historical Provider, MD  DULoxetine (CYMBALTA) 60 MG capsule Take 60 mg by mouth every evening.    Yes Historical Provider, MD  ferrous sulfate 325 (65 FE) MG tablet Take 1 tablet (325 mg total) by mouth 3 (three) times daily after meals. 12/07/14  Yes Lanney Gins, PA-C  hydrALAZINE (APRESOLINE) 10 MG tablet Take 10 mg by mouth 3 (three) times daily.  11/22/12  Yes Historical Provider, MD  ipratropium-albuterol (DUONEB) 0.5-2.5 (3) MG/3ML SOLN Take 3 mLs by nebulization every morning.   Yes Historical Provider,  MD  levothyroxine (SYNTHROID, LEVOTHROID) 100 MCG tablet Take 100 mcg by mouth every morning.    Yes Historical Provider, MD  Linaclotide Karlene Einstein) 145 MCG CAPS capsule Take 145 mcg by mouth every other day. Alternates with the 290 mg tablet in the am.   Yes Historical Provider, MD  Linaclotide 290 MCG CAPS Take 290 mcg by mouth every other day. Alternates with the 145 mg tablets in the morning.   Yes Historical Provider, MD  memantine (NAMENDA) 10 MG tablet Take 10 mg by mouth 2 (two) times daily.    Yes Historical Provider, MD  Multiple Vitamin (MULTIVITAMIN) tablet Take 0.5 tablets by mouth every morning.    Yes Historical Provider, MD  nitroGLYCERIN (NITROSTAT) 0.4 MG SL tablet Place 0.4 mg under the tongue every 5 (five) minutes as needed for chest pain (MAX 3 TABLETS).    Yes Historical Provider, MD  omeprazole (PRILOSEC) 20 MG capsule Take 20 mg by mouth daily.   Yes Historical Provider, MD  polyethylene glycol (MIRALAX / GLYCOLAX) packet Take 17 g by mouth 2 (two) times daily. 12/07/14  Yes Lanney Gins, PA-C  pravastatin (PRAVACHOL) 40 MG tablet Take 40 mg by mouth every evening.    Yes Historical Provider, MD  pregabalin (LYRICA) 75 MG capsule Take 75 mg by mouth 2 (two) times daily.    Yes Historical Provider, MD  torsemide (DEMADEX) 20 MG tablet Take 20 mg by mouth 2 (two) times daily. PEr Dr Allena Katz - patient to not take on 8/14 nor am of 8/15   Yes Historical Provider, MD   Turmeric 500 MG CAPS Take 1 capsule by mouth 2 (two) times daily.   Yes Historical Provider, MD  ULORIC 40 MG tablet Take 40 mg by mouth every evening.  12/02/12  Yes Historical Provider, MD  vitamin B-12 (CYANOCOBALAMIN) 1000 MCG tablet Take 1,000 mcg by mouth 2 (two) times a week. Tuesday and Friday   Yes Historical Provider, MD  warfarin (COUMADIN) 5 MG tablet Take 1 tablet by mouth daily. 10/16/14  Yes Historical Provider, MD  colchicine 0.6 MG tablet Take 0.5 tablets (0.3 mg total) by mouth daily. 12/15/14   Hollice Espy, MD  HYDROcodone-acetaminophen (NORCO/VICODIN) 5-325 MG per tablet Take 1-2 tablets by mouth every 6 (six) hours as needed for moderate pain.    Historical Provider, MD  NON FORMULARY 2 Liters oxygen as needed for shortness of breath    Historical Provider, MD   BP 127/54 mmHg  Pulse 72  Temp(Src) 97.8 F (36.6 C) (Oral)  Resp 16  Ht 5' (1.524 m)  Wt 167 lb 15.9 oz (76.2 kg)  BMI 32.81 kg/m2  SpO2 98% Physical Exam  Constitutional: She is oriented to person, place, and time. She appears well-developed and well-nourished.  Patient is having harsh and recurrent cough paroxysms. She is speaking in short full sentences. The patient is alert and appropriate. She is nontoxic.  HENT:  Head: Normocephalic and atraumatic.  Mouth/Throat: Oropharynx is clear and moist.  Eyes: EOM are normal. Pupils are equal, round, and reactive to light.  Neck: Neck supple.  Cardiovascular: Normal rate, regular rhythm, normal heart sounds and intact distal pulses.   Pulmonary/Chest:  Patient's upper airway is patent. She is having frequent dry cough episodes. There is air flow to the distal lung fields with some central high-pitched wheeze. No stridor.  Abdominal: Soft. Bowel sounds are normal. She exhibits no distension. There is no tenderness.  Musculoskeletal: Normal range of motion.  1+ bilateral  pitting edema.  Neurological: She is alert and oriented to person, place, and time. She  has normal strength. No cranial nerve deficit. She exhibits normal muscle tone. Coordination normal. GCS eye subscore is 4. GCS verbal subscore is 5. GCS motor subscore is 6.  Skin: Skin is warm, dry and intact.  Psychiatric: She has a normal mood and affect.    ED Course  Procedures (including critical care time) Labs Review Labs Reviewed  BASIC METABOLIC PANEL - Abnormal; Notable for the following:    Chloride 97 (*)    CO2 33 (*)    Glucose, Bld 116 (*)    BUN 77 (*)    Creatinine, Ser 2.63 (*)    GFR calc non Af Amer 16 (*)    GFR calc Af Amer 19 (*)    All other components within normal limits  CBC WITH DIFFERENTIAL/PLATELET - Abnormal; Notable for the following:    RBC 2.68 (*)    Hemoglobin 8.3 (*)    HCT 25.7 (*)    RDW 15.8 (*)    All other components within normal limits  FOLATE RBC - Abnormal; Notable for the following:    Hematocrit 24.8 (*)    All other components within normal limits  TSH - Abnormal; Notable for the following:    TSH 6.001 (*)    All other components within normal limits  CBC - Abnormal; Notable for the following:    RBC 2.64 (*)    Hemoglobin 8.2 (*)    HCT 25.4 (*)    RDW 15.8 (*)    All other components within normal limits  PROTIME-INR - Abnormal; Notable for the following:    Prothrombin Time 15.9 (*)    All other components within normal limits  COMPREHENSIVE METABOLIC PANEL - Abnormal; Notable for the following:    Chloride 97 (*)    CO2 34 (*)    Glucose, Bld 107 (*)    BUN 71 (*)    Creatinine, Ser 2.54 (*)    Total Protein 5.7 (*)    Albumin 3.2 (*)    AST 48 (*)    GFR calc non Af Amer 17 (*)    GFR calc Af Amer 19 (*)    All other components within normal limits  GLUCOSE, CAPILLARY - Abnormal; Notable for the following:    Glucose-Capillary 104 (*)    All other components within normal limits  MRSA PCR SCREENING  PROTIME-INR  IRON AND TIBC  FERRITIN  VITAMIN B12  HEMOGLOBIN A1C    Imaging Review No results  found. I have personally reviewed and evaluated these images and lab results as part of my medical decision-making.   EKG Interpretation   Date/Time:  Thursday December 14 2014 20:22:03 EDT Ventricular Rate:  64 PR Interval:  188 QRS Duration: 137 QT Interval:  452 QTC Calculation: 466 R Axis:   -49 Text Interpretation:  Age not entered, assumed to be  81 years old for  purpose of ECG interpretation Atrial-paced rhythm Left bundle branch block  ED PHYSICIAN INTERPRETATION AVAILABLE IN [facility] Confirmed by  TEST, Record ([id number]) on 12/15/2014 7:10:19 AM      MDM   Final diagnoses:  Aspiration of food, initial encounter  Swallowing dysfunction   Patient had aspiration of food. She continued to have harsh coughing. With trial of swallowing, the patient failed to take liquids without coughing and aspirating. The patient will be admitted this time for ongoing evaluation of swallowing function and aspiration.  Arby Barrette, MD 12/27/14 [phone]

## 2014-12-14 NOTE — H&P (Addendum)
[facility] History and Physical  [name] ZOX:[id number] DOB: 1933/03/10 DOA: 12/14/2014  Referring physician: Arlyss Gandy, MD PCP: Pearson Grippe, MD   Chief Complaint: Aspiration  HPI: [name] is a 81 y.o. female with prior historuy of HTN HLD CMP Stoke Anemia presents after an aspiration event. The patient was apparently eating cornbread and it reportedly choked her. She staes that since the event she has done nothing but cough. She did apparently cough up a couple of pieces but still feels as there is somehting in her chest. Patient had a CT scan done and this shows no obvious obstruction but there was a lot of motion artifact. She has no fever noted at this time. She has no nausea or vomiting noted. In the ED attempted at swallowing medications but began to choke on them so she is being admitted for observation. Patient resides at a rehab facility.    Review of Systems:  Complete ROS performed and is unremarkable other than HPI.   Past Medical History  Diagnosis Date  . Syncope   . Symptomatic bradycardia   . Chronic lower back pain   . Nonischemic cardiomyopathy   . HTN (hypertension)   . HLD (hyperlipidemia)   . Atrial fibrillation     tachy-brady syndrome with <1% recurrent PAF since pacemaker placement  . H/O: stroke   . Pulmonary embolism     HISTORY OF, the pt. had a recurrent bilateral pulmonary emboli in 2005, on warfarin therapy and at which time she under went implantation of IVC filter  . Anemia   . Dementia   . Gastroesophageal reflux disease   . Aortic stenosis 10/13/2012    Low EF, low gradient with severe aortic stenosis confirmed by dobutamine stress echocardiogram s/p TAVR 12/2012  . PONV (postoperative nausea and vomiting)   . Osteoarthritis   . Neuropathy   . Spinal stenosis   . Symptomatic bradycardia 2012    s/p Medtronic PPM  . Chronic combined systolic and diastolic CHF (congestive heart failure)   . Fibromyalgia   . Presence  of permanent cardiac pacemaker   . Sleep apnea     uses oxygen at night and PRN- not used since > 6 months   . Shortness of breath dyspnea     with exertion   . Pneumonia     hx of x 3   . Acute on chronic renal failure     sees Dr Allena Katz   . CKD (chronic kidney disease)   . Headache     hx of migraines   . On home oxygen therapy     patient uses at nite- 2L- has not used in > 6 months per patient   Past Surgical History  Procedure Laterality Date  . Pacemaker insertion      Medtronic  . Transcatheter aortic valve replacement, transfemoral  12/21/2012    a. 29mm Edwards Sapien XT transcatheter heart valve placed via open left transfemoral approach b. Intra-op TEE: well-seated bioprosthetic aortic valve with mean gradient 2 mmHg, trivial AI, mild MR, EF 30-35%  . Transcatheter aortic valve replacement, transfemoral N/A 12/21/2012    Procedure: TRANSCATHETER AORTIC VALVE REPLACEMENT, TRANSFEMORAL;  Surgeon: Tonny Bollman, MD;  Location: [facility] OR;  Service: Open Heart Surgery;  Laterality: N/A;  . Intraoperative transesophageal echocardiogram N/A 12/21/2012    Procedure: INTRAOPERATIVE TRANSESOPHAGEAL ECHOCARDIOGRAM;  Surgeon: Tonny Bollman, MD;  Location: [facility] OR;  Service: Open Heart Surgery;  Laterality: N/A;  . Central venous catheter insertion  Left 12/21/2012    Procedure: INSERTION CENTRAL LINE ADULT;  Surgeon: Tonny Bollman, MD;  Location: [facility] OR;  Service: Open Heart Surgery;  Laterality: Left;  . Cataract extraction    . Bunionectomy    . Appendectomy    . Abdominal hysterectomy    . Thyroidectomy, partial    . Back surgery    . Knee surgery Left   . Nose surgery      X 2  . Left and right heart catheterization with coronary angiogram N/A 10/18/2012    Procedure: LEFT AND RIGHT HEART CATHETERIZATION WITH CORONARY ANGIOGRAM;  Surgeon: Tonny Bollman, MD;  Location: [facility] CATH LAB;  Service: Cardiovascular;  Laterality: N/A;  . Nasal septum surgery    . Total knee arthroplasty Right  12/04/2014    Procedure: RIGHT TOTAL  KNEE ARTHROPLASTY;  Surgeon: Durene Romans, MD;  Location: [facility] ORS;  Service: Orthopedics;  Laterality: Right;   Social History:  reports that she has never smoked. She has never used smokeless tobacco. She reports that she does not drink alcohol or use illicit drugs.  Allergies  Allergen Reactions  . Codeine Hives  . Darvocet [Propoxyphene N-Acetaminophen] Hives  . Nubain [Nalbuphine Hcl] Hives    Went into cardiac arrest     Family History  Problem Relation Age of Onset  . Ovarian cancer Mother     Deceased  . Epilepsy Father     Deceased     Prior to Admission medications   Medication Sig Start Date End Date Taking? Authorizing Provider  alendronate (FOSAMAX) 70 MG tablet Take 70 mg by mouth once a week. Saturday.   Yes Historical Provider, MD  amiodarone (PACERONE) 200 MG tablet Take 0.5 tablets (100 mg total) by mouth daily. Patient taking differently: Take 100 mg by mouth every morning.  01/07/13  Yes Rosalio Macadamia, NP  BIOTIN 5000 PO Take 5,000 mcg by mouth every evening.    Yes Historical Provider, MD  calcitRIOL (ROCALTROL) 0.25 MCG capsule Take 0.25 mcg by mouth every morning.    Yes Historical Provider, MD  carvedilol (COREG) 12.5 MG tablet Take 1 tablet by mouth 2 (two) times daily. 07/20/14  Yes Historical Provider, MD  cetirizine (ZYRTEC) 10 MG tablet Take 10 mg by mouth every morning.    Yes Historical Provider, MD  Cholecalciferol (VITAMIN D) 1000 UNITS capsule Take 2,000 Units by mouth at bedtime.    Yes Historical Provider, MD  colchicine 0.6 MG tablet Take 0.6 mg by mouth daily.   Yes Historical Provider, MD  cycloSPORINE (RESTASIS) 0.05 % ophthalmic emulsion Place 1 drop into both eyes 2 (two) times daily as needed (dry eyes.).   Yes Historical Provider, MD  docusate sodium (COLACE) 50 MG capsule Take 50 mg by mouth 2 (two) times daily.    Yes Historical Provider, MD  donepezil (ARICEPT) 10 MG tablet Take 10 mg by mouth at  bedtime.    Yes Historical Provider, MD  DULoxetine (CYMBALTA) 60 MG capsule Take 60 mg by mouth every evening.    Yes Historical Provider, MD  enoxaparin (LOVENOX) 30 MG/0.3ML injection Inject 30 mg into the skin daily.   Yes Historical Provider, MD  esomeprazole (NEXIUM) 40 MG capsule Take 40 mg by mouth daily before breakfast.     Yes Historical Provider, MD  ferrous sulfate 325 (65 FE) MG tablet Take 1 tablet (325 mg total) by mouth 3 (three) times daily after meals. 12/07/14  Yes Lanney Gins, PA-C  hydrALAZINE (APRESOLINE) 10 MG  tablet Take 10 mg by mouth 3 (three) times daily.  11/22/12  Yes Historical Provider, MD  HYDROcodone-acetaminophen (NORCO) 10-325 MG per tablet Take one tablet by mouth twice daily for pain. Do not exceed 4gm of Tylenol in 24 hours 12/12/14  Yes Sharon Seller, NP  HYDROcodone-acetaminophen (NORCO/VICODIN) 5-325 MG per tablet Take one tablet by mouth every 6 hours as needed for pain. Do not exceed 4gm of Tylenol in 24 hours 12/12/14  Yes Sharon Seller, NP  ipratropium-albuterol (DUONEB) 0.5-2.5 (3) MG/3ML SOLN Take 3 mLs by nebulization every morning.   Yes Historical Provider, MD  levothyroxine (SYNTHROID, LEVOTHROID) 100 MCG tablet Take 100 mcg by mouth every morning.    Yes Historical Provider, MD  Linaclotide Karlene Einstein) 145 MCG CAPS capsule Take 145 mcg by mouth every other day. Alternates with the 290 mg tablet in the am.   Yes Historical Provider, MD  Linaclotide 290 MCG CAPS Take 290 mcg by mouth every other day. Alternates with the 145 mg tablets in the morning.   Yes Historical Provider, MD  memantine (NAMENDA) 10 MG tablet Take 10 mg by mouth 2 (two) times daily.    Yes Historical Provider, MD  Multiple Vitamin (MULTIVITAMIN) tablet Take 0.5 tablets by mouth every morning.    Yes Historical Provider, MD  nitroGLYCERIN (NITROSTAT) 0.4 MG SL tablet Place 0.4 mg under the tongue every 5 (five) minutes as needed for chest pain (MAX 3 TABLETS).    Yes Historical  Provider, MD  omeprazole (PRILOSEC) 20 MG capsule Take 20 mg by mouth daily.   Yes Historical Provider, MD  polyethylene glycol (MIRALAX / GLYCOLAX) packet Take 17 g by mouth 2 (two) times daily. 12/07/14  Yes Lanney Gins, PA-C  pravastatin (PRAVACHOL) 40 MG tablet Take 40 mg by mouth every evening.    Yes Historical Provider, MD  pregabalin (LYRICA) 75 MG capsule Take 75 mg by mouth 2 (two) times daily.    Yes Historical Provider, MD  torsemide (DEMADEX) 20 MG tablet Take 20 mg by mouth 2 (two) times daily. PEr Dr Allena Katz - patient to not take on 8/14 nor am of 8/15   Yes Historical Provider, MD  Turmeric 500 MG CAPS Take 1 capsule by mouth 2 (two) times daily.   Yes Historical Provider, MD  ULORIC 40 MG tablet Take 40 mg by mouth every evening.  12/02/12  Yes Historical Provider, MD  vitamin B-12 (CYANOCOBALAMIN) 1000 MCG tablet Take 1,000 mcg by mouth 2 (two) times a week. Tuesday and Friday   Yes Historical Provider, MD  warfarin (COUMADIN) 5 MG tablet Take 1 tablet by mouth daily. 10/16/14  Yes Historical Provider, MD  NON FORMULARY 2 Liters oxygen as needed for shortness of breath    Historical Provider, MD  warfarin (COUMADIN) 2.5 MG tablet Take 1 tablet (2.5 mg total) by mouth one time only at 6 PM. Patient not taking: Reported on 12/14/2014 12/07/14   Lanney Gins, PA-C   Physical Exam: Filed Vitals:   12/14/14 1934  BP: 158/85  Pulse: 90  Temp: 99 F (37.2 C)  TempSrc: Oral  Resp: 20  SpO2: 91%    Wt Readings from Last 3 Encounters:  12/12/14 82.192 kg (181 lb 3.2 oz)  12/08/14 82.192 kg (181 lb 3.2 oz)  12/04/14 76.658 kg (169 lb)    General:  Appears calm and comfortable Eyes: PERRL, normal lids, irises & conjunctiva ENT: grossly normal hearing, lips & tongue Neck: no LAD, masses or thyromegaly Cardiovascular: RRR,  no m/r/g. No LE edema Respiratory:  Normal respiratory effort. ronchi both bases noted Abdomen: soft, ntnd Skin: no rash or induration seen on limited  exam Musculoskeletal: grossly normal tone BUE/BLE Psychiatric: grossly normal mood and affect Neurologic: grossly non-focal.          Labs on Admission:  Basic Metabolic Panel:  Recent Labs Lab 12/14/14 2000  NA 141  K 4.3  CL 97*  CO2 33*  GLUCOSE 116*  BUN 77*  CREATININE 2.63*  CALCIUM 9.6   Liver Function Tests: No results for input(s): AST, ALT, ALKPHOS, BILITOT, PROT, ALBUMIN in the last 168 hours. No results for input(s): LIPASE, AMYLASE in the last 168 hours. No results for input(s): AMMONIA in the last 168 hours. CBC:  Recent Labs Lab 12/14/14 2000  WBC 8.4  NEUTROABS 5.2  HGB 8.3*  HCT 25.7*  MCV 95.9  PLT 269   Cardiac Enzymes: No results for input(s): CKTOTAL, CKMB, CKMBINDEX, TROPONINI in the last 168 hours.  BNP (last 3 results) No results for input(s): BNP in the last 8760 hours.  ProBNP (last 3 results) No results for input(s): PROBNP in the last 8760 hours.  CBG: No results for input(s): GLUCAP in the last 168 hours.  Radiological Exams on Admission: Ct Chest Wo Contrast  12/14/2014   CLINICAL DATA:  Choked on food during dinner, coughed up 2 pieces of core bread, ongoing cough, mild stridor, history hypertension, hyperlipidemia, atrial fibrillation, stroke, pulmonary embolism, dementia, chronic kidney disease  EXAM: CT CHEST WITHOUT CONTRAST  TECHNIQUE: Multidetector CT imaging of the chest was performed following the standard protocol without IV contrast. Sagittal and coronal MPR images reconstructed from axial data set.  COMPARISON:  CTA chest 11/10/2012  FINDINGS: Atherosclerotic calcifications aorta, coronary arteries and proximal great vessels.  Post TAVR.  Pacemaker leads in RIGHT atrium and RIGHT ventricle.  Visualized upper abdomen unremarkable.  Moderate-sized hiatal hernia.  Normal size mediastinal lymph nodes without definite thoracic adenopathy.  Beam hardening artifacts from pacemaker generator anterior RIGHT chest wall.  Thoracic  esophagus to the air-filled with otherwise unremarkable.  No office esophageal mass or foreign body.  Respiratory motion artifacts from patient coughing degrade assessment of the lungs.  No definite pulmonary infiltrate, pleural effusion, or pneumothorax.  Trachea is markedly narrow distally in its AP dimension, suspect tracheomalacia in the setting of expiration.  No acute osseous findings.  Sclerotic focus within posterior aspect of the T5 vertebral body appears unchanged since 2014.  IMPRESSION: Extensive atherosclerotic disease.  Prior TAVR and pacemaker.  Question tracheomalacia.  No additional acute abnormalities identified within limits imposed by respiratory motion artifacts.   Electronically Signed   By: Ulyses Southward M.D.   On: 12/14/2014 20:32      Assessment/Plan Active Problems:   Hypertension   CKD (chronic kidney disease), stage III   Aspiration pneumonia   Anemia   1. Aspiration event into airway -patient being admitted for observation -ED spoke with Pulmonary and they suggested observation overnight -she was also unable to swallow and therefore will need SLP assessment -will start on empiric antibiotics for aspiration coverage  2. HTN -continue with home medications -monitor pressures  3. CKD III -currently at baseline -will monitor  4. Anemia -will check iron ferritin studies  5. Atrial Fibrillation -will continue amiodarone  6. Hypothyroid -continue synthroid -check TSH    Code Status: full code (must indicate code status--if unknown or must be presumed, indicate so) DVT Prophylaxis:SCD Family Communication: none (indicate person spoken with,  if applicable, with phone number if by telephone) Disposition Plan: home (indicate anticipated LOS)    [facility] A [facility] Pager [phone]

## 2014-12-14 NOTE — ED Notes (Signed)
Pt failed stroke swallow screen, unable to administer PO meds ordered.

## 2014-12-14 NOTE — Progress Notes (Signed)
CSW met with patient at bedside. There was no family present.   Patient confirms that she presents to [facility] due to food being stuck in her throat. Also, patient confirms that she is from [place] and has been living there for a week.  Patient informed CSW that she receives assistance with completing ADL's. Patient states that she has a good support system that consist of her children. According to patient, when she is not at rehab she lives with her son.  Patient states that she always ambulates with a cane or walker.  Willette Brace [phone] ED CSW 12/14/2014 10:54 PM

## 2014-12-14 NOTE — ED Notes (Signed)
2 unsuccessful blood draw attempts, consulting IV team. Anticipate lab draw delay.

## 2014-12-14 NOTE — ED Notes (Signed)
Pt is presented [facility] assisted [facility], report of food "chocking" while having dinner, facility nebulizer duonebs, although medics report pt was able to cough up 2 piecesof corn bread, pt still has an ongoing bout of cough with mild stridor in the upper air way. Pt does not seem to in distress but is uncomfortable from the coughing. Place on 2L nasal canual for comfort.

## 2014-12-15 DIAGNOSIS — N183 Chronic kidney disease, stage 3 (moderate): Secondary | ICD-10-CM | POA: Diagnosis not present

## 2014-12-15 DIAGNOSIS — R1314 Dysphagia, pharyngoesophageal phase: Secondary | ICD-10-CM

## 2014-12-15 DIAGNOSIS — I10 Essential (primary) hypertension: Secondary | ICD-10-CM

## 2014-12-15 LAB — COMPREHENSIVE METABOLIC PANEL
ALT: 32 U/L (ref 14–54)
ANION GAP: 9 (ref 5–15)
AST: 48 U/L — ABNORMAL HIGH (ref 15–41)
Albumin: 3.2 g/dL — ABNORMAL LOW (ref 3.5–5.0)
Alkaline Phosphatase: 68 U/L (ref 38–126)
BUN: 71 mg/dL — ABNORMAL HIGH (ref 6–20)
CHLORIDE: 97 mmol/L — AB (ref 101–111)
CO2: 34 mmol/L — AB (ref 22–32)
Calcium: 9.2 mg/dL (ref 8.9–10.3)
Creatinine, Ser: 2.54 mg/dL — ABNORMAL HIGH (ref 0.44–1.00)
GFR, EST AFRICAN AMERICAN: 19 mL/min — AB (ref >60)
GFR, EST NON AFRICAN AMERICAN: 17 mL/min — AB (ref >60)
Glucose, Bld: 107 mg/dL — ABNORMAL HIGH (ref 65–99)
Potassium: 4.1 mmol/L (ref 3.5–5.1)
SODIUM: 140 mmol/L (ref 135–145)
Total Bilirubin: 0.7 mg/dL (ref 0.3–1.2)
Total Protein: 5.7 g/dL — ABNORMAL LOW (ref 6.5–8.1)

## 2014-12-15 LAB — CBC
HCT: 25.4 % — ABNORMAL LOW (ref 36.0–46.0)
Hemoglobin: 8.2 g/dL — ABNORMAL LOW (ref 12.0–15.0)
MCH: 31.1 pg (ref 26.0–34.0)
MCHC: 32.3 g/dL (ref 30.0–36.0)
MCV: 96.2 fL (ref 78.0–100.0)
PLATELETS: 261 10*3/uL (ref 150–400)
RBC: 2.64 MIL/uL — ABNORMAL LOW (ref 3.87–5.11)
RDW: 15.8 % — AB (ref 11.5–15.5)
WBC: 8 10*3/uL (ref 4.0–10.5)

## 2014-12-15 LAB — GLUCOSE, CAPILLARY: GLUCOSE-CAPILLARY: 104 mg/dL — AB (ref 65–99)

## 2014-12-15 LAB — MRSA PCR SCREENING: MRSA BY PCR: NEGATIVE

## 2014-12-15 LAB — PROTIME-INR
INR: 1.25 (ref 0.00–1.49)
PROTHROMBIN TIME: 15.9 s — AB (ref 11.6–15.2)

## 2014-12-15 LAB — FERRITIN: FERRITIN: 146 ng/mL (ref 11–307)

## 2014-12-15 LAB — IRON AND TIBC
Iron: 42 ug/dL (ref 28–170)
Saturation Ratios: 13 % (ref 10.4–31.8)
TIBC: 321 ug/dL (ref 250–450)
UIBC: 279 ug/dL

## 2014-12-15 LAB — VITAMIN B12: VITAMIN B 12: 598 pg/mL (ref 180–914)

## 2014-12-15 LAB — TSH: TSH: 6.001 u[IU]/mL — AB (ref 0.350–4.500)

## 2014-12-15 MED ORDER — ACETAMINOPHEN 650 MG RE SUPP: 650.0000 mg | Freq: Four times a day (QID) | RECTAL | Status: DC | PRN

## 2014-12-15 MED ORDER — ALENDRONATE SODIUM 70 MG PO TABS: 70.0000 mg | ORAL_TABLET | ORAL | Status: DC

## 2014-12-15 MED ORDER — SODIUM CHLORIDE 0.9 % IV SOLN
INTRAVENOUS | Status: AC
  Administered 2014-12-15: 01:00:00 via INTRAVENOUS

## 2014-12-15 MED ORDER — AMIODARONE HCL 100 MG PO TABS
100.0000 mg | ORAL_TABLET | Freq: Every morning | ORAL | Status: DC
  Administered 2014-12-15: 100 mg via ORAL

## 2014-12-15 MED ORDER — FOLIC ACID 1 MG PO TABS
1.0000 mg | ORAL_TABLET | Freq: Every day | ORAL | Status: DC
  Administered 2014-12-15: 1 mg via ORAL
  Filled 2014-12-15: qty 1

## 2014-12-15 MED ORDER — LEVOTHYROXINE SODIUM 100 MCG PO TABS
100.0000 ug | ORAL_TABLET | Freq: Every morning | ORAL | Status: DC
  Filled 2014-12-15: qty 1

## 2014-12-15 MED ORDER — TORSEMIDE 20 MG PO TABS
20.0000 mg | ORAL_TABLET | Freq: Two times a day (BID) | ORAL | Status: DC
  Administered 2014-12-15: 20 mg via ORAL
  Filled 2014-12-15 (×3): qty 1

## 2014-12-15 MED ORDER — VITAMIN B-1 100 MG PO TABS
100.0000 mg | ORAL_TABLET | Freq: Every day | ORAL | Status: DC
Start: 2014-12-15 — End: 2014-12-15
  Administered 2014-12-15: 100 mg via ORAL
  Filled 2014-12-15: qty 1

## 2014-12-15 MED ORDER — DOCUSATE SODIUM 50 MG PO CAPS
50.0000 mg | ORAL_CAPSULE | Freq: Two times a day (BID) | ORAL | Status: DC
  Administered 2014-12-15: 50 mg via ORAL
  Filled 2014-12-15 (×3): qty 1

## 2014-12-15 MED ORDER — DONEPEZIL HCL 5 MG PO TABS: 10.0000 mg | ORAL_TABLET | Freq: Every day | ORAL | Status: DC

## 2014-12-15 MED ORDER — ONDANSETRON HCL 4 MG/2ML IJ SOLN: 4.0000 mg | Freq: Four times a day (QID) | INTRAMUSCULAR | Status: DC | PRN

## 2014-12-15 MED ORDER — LORATADINE 10 MG PO TABS
10.0000 mg | ORAL_TABLET | Freq: Every day | ORAL | Status: DC
  Administered 2014-12-15: 10 mg via ORAL
  Filled 2014-12-15: qty 1

## 2014-12-15 MED ORDER — CARVEDILOL 12.5 MG PO TABS
12.5000 mg | ORAL_TABLET | Freq: Two times a day (BID) | ORAL | Status: DC
  Administered 2014-12-15: 12.5 mg via ORAL
  Filled 2014-12-15: qty 1

## 2014-12-15 MED ORDER — ONDANSETRON HCL 4 MG PO TABS: 4.0000 mg | ORAL_TABLET | Freq: Four times a day (QID) | ORAL | Status: DC | PRN

## 2014-12-15 MED ORDER — CYCLOSPORINE 0.05 % OP EMUL
1.0000 [drp] | Freq: Two times a day (BID) | OPHTHALMIC | Status: DC | PRN
  Filled 2014-12-15: qty 1

## 2014-12-15 MED ORDER — DEXTROSE-NACL 5-0.45 % IV SOLN
INTRAVENOUS | Status: DC
  Administered 2014-12-15: 02:00:00 via INTRAVENOUS

## 2014-12-15 MED ORDER — ACETAMINOPHEN 325 MG PO TABS: 650.0000 mg | ORAL_TABLET | Freq: Four times a day (QID) | ORAL | Status: DC | PRN

## 2014-12-15 MED ORDER — FEBUXOSTAT 40 MG PO TABS
40.0000 mg | ORAL_TABLET | Freq: Every evening | ORAL | Status: DC
  Filled 2014-12-15: qty 1

## 2014-12-15 MED ORDER — POLYETHYLENE GLYCOL 3350 17 G PO PACK
17.0000 g | PACK | Freq: Two times a day (BID) | ORAL | Status: DC
  Administered 2014-12-15: 17 g via ORAL
  Filled 2014-12-15: qty 1

## 2014-12-15 MED ORDER — ENOXAPARIN SODIUM 30 MG/0.3ML ~~LOC~~ SOLN
30.0000 mg | SUBCUTANEOUS | Status: DC
  Administered 2014-12-15: 30 mg via SUBCUTANEOUS
  Filled 2014-12-15: qty 0.3

## 2014-12-15 MED ORDER — COLCHICINE 0.6 MG PO TABS: 0.3000 mg | ORAL_TABLET | Freq: Every day | ORAL | Status: DC

## 2014-12-15 MED ORDER — MEMANTINE HCL 10 MG PO TABS
10.0000 mg | ORAL_TABLET | Freq: Two times a day (BID) | ORAL | Status: DC
  Administered 2014-12-15: 10 mg via ORAL
  Filled 2014-12-15: qty 1

## 2014-12-15 MED ORDER — FERROUS SULFATE 325 (65 FE) MG PO TABS
325.0000 mg | ORAL_TABLET | Freq: Three times a day (TID) | ORAL | Status: DC
  Administered 2014-12-15: 325 mg via ORAL
  Filled 2014-12-15 (×2): qty 1

## 2014-12-15 MED ORDER — COLCHICINE 0.6 MG PO TABS
0.6000 mg | ORAL_TABLET | Freq: Every day | ORAL | Status: DC
  Administered 2014-12-15: 0.6 mg via ORAL
  Filled 2014-12-15: qty 1

## 2014-12-15 MED ORDER — LINACLOTIDE 145 MCG PO CAPS
145.0000 ug | ORAL_CAPSULE | ORAL | Status: DC
  Administered 2014-12-15: 145 ug via ORAL
  Filled 2014-12-15: qty 1

## 2014-12-15 MED ORDER — CLINDAMYCIN PHOSPHATE 300 MG/50ML IV SOLN
300.0000 mg | Freq: Four times a day (QID) | INTRAVENOUS | Status: DC
  Administered 2014-12-15 (×2): 300 mg via INTRAVENOUS
  Filled 2014-12-15 (×4): qty 50

## 2014-12-15 MED ORDER — DULOXETINE HCL 60 MG PO CPEP: 60.0000 mg | ORAL_CAPSULE | Freq: Every evening | ORAL | Status: DC

## 2014-12-15 MED ORDER — PREGABALIN 75 MG PO CAPS
75.0000 mg | ORAL_CAPSULE | Freq: Two times a day (BID) | ORAL | Status: DC
  Administered 2014-12-15: 75 mg via ORAL
  Filled 2014-12-15: qty 1

## 2014-12-15 MED ORDER — PANTOPRAZOLE SODIUM 40 MG PO TBEC
40.0000 mg | DELAYED_RELEASE_TABLET | Freq: Every day | ORAL | Status: DC
  Administered 2014-12-15: 40 mg via ORAL
  Filled 2014-12-15: qty 1

## 2014-12-15 MED ORDER — ADULT MULTIVITAMIN W/MINERALS CH
1.0000 | ORAL_TABLET | Freq: Every day | ORAL | Status: DC
Start: 2014-12-15 — End: 2014-12-15
  Administered 2014-12-15: 1 via ORAL
  Filled 2014-12-15: qty 1

## 2014-12-15 MED ORDER — HYDRALAZINE HCL 10 MG PO TABS
10.0000 mg | ORAL_TABLET | Freq: Three times a day (TID) | ORAL | Status: DC
  Administered 2014-12-15: 10 mg via ORAL
  Filled 2014-12-15: qty 1

## 2014-12-15 MED ORDER — IPRATROPIUM-ALBUTEROL 0.5-2.5 (3) MG/3ML IN SOLN
3.0000 mL | Freq: Every morning | RESPIRATORY_TRACT | Status: DC
  Administered 2014-12-15: 3 mL via RESPIRATORY_TRACT
  Filled 2014-12-15: qty 3

## 2014-12-15 MED ORDER — NITROGLYCERIN 0.4 MG SL SUBL: 0.4000 mg | SUBLINGUAL_TABLET | SUBLINGUAL | Status: DC | PRN

## 2014-12-15 MED ORDER — VITAMIN D 1000 UNITS PO TABS
2000.0000 [IU] | ORAL_TABLET | Freq: Every day | ORAL | Status: DC
  Filled 2014-12-15: qty 2

## 2014-12-15 MED ORDER — PRAVASTATIN SODIUM 40 MG PO TABS: 40.0000 mg | ORAL_TABLET | Freq: Every evening | ORAL | Status: DC

## 2014-12-15 NOTE — Evaluation (Signed)
Occupational Therapy Evaluation Patient Details Name: [name] MRN: [id number] DOB: 01/30/1933 Today's Date: 12/15/2014    History of Present Illness 81 year old female with past mental history of stroke, hypertension anemia, recent R TKA on 12/04/14 who was receiving rehab at a skilled nursing facility. Admitted on 8/25 after eating a piece of cornbread which initially choked her   Clinical Impression   Pt receiving therapy at SNF after recent knee surgery. Likely to d/c back to SNF today. Will follow if on acute after today. Overall at min to mod assist with ADL.     Follow Up Recommendations  SNF;Supervision/Assistance - 24 hour    Equipment Recommendations  Other (comment) (defer to SNF)    Recommendations for Other Services       Precautions / Restrictions Precautions Precautions: Fall Precaution Comments: recent R TKA on 12/04/14 Restrictions Other Position/Activity Restrictions: WBAT      Mobility Bed Mobility Overal bed mobility: Needs Assistance Bed Mobility: Supine to Sit     Supine to sit: Min guard;HOB elevated     General bed mobility comments: increased time and effort.   Transfers Overall transfer level: Needs assistance Equipment used: Rolling walker (2 wheeled) Transfers: Sit to/from Stand Sit to Stand: Min assist Stand pivot transfers: Min assist       General transfer comment: min assist to rise and steady. cues for hand placement.    Balance Overall balance assessment: Needs assistance   Sitting balance-Leahy Scale: Good       Standing balance-Leahy Scale: Poor                              ADL Overall ADL's : Needs assistance/impaired Eating/Feeding: Independent;Set up;Sitting   Grooming: Set up;Sitting   Upper Body Bathing: Set up;Sitting   Lower Body Bathing: Minimal assistance;Sit to/from stand   Upper Body Dressing : Set up;Sitting   Lower Body Dressing: Minimal assistance;Moderate assistance;Sit  to/from stand   Toilet Transfer: Minimal assistance;Stand-pivot;BSC;RW   Toileting- Clothing Manipulation and Hygiene: Minimal assistance;Sit to/from stand         General ADL Comments: Pt slow to rise from surface and forward flexed posture. Pt tends to lean forward over the walker. Pt able to reach to bilateral foot to adjust socks today from bed level. Plan is to return to SNF today. Pt states she was just begininng to do own bathing, dressing at SNF and supervision to ambulate to commode. Pt 98% on RA with activity. Pt states she wears O2 at night at home.     Vision     Perception     Praxis      Pertinent Vitals/Pain Pain Assessment: 0-10 Pain Score: 6  Pain Location: R knee, back, feet Pain Descriptors / Indicators: Aching Pain Intervention(s): Repositioned;Monitored during session     Hand Dominance Right   Extremity/Trunk Assessment Upper Extremity Assessment Upper Extremity Assessment: Generalized weakness           Communication Communication Communication: No difficulties   Cognition Arousal/Alertness: Awake/alert Behavior During Therapy: WFL for tasks assessed/performed Overall Cognitive Status: Within Functional Limits for tasks assessed                 General Comments: HOH;    General Comments       Exercises       Shoulder Instructions      Home Living Family/patient expects to be discharged to:: Skilled nursing facility  Additional Comments: at SNF for rehab. lived with son prior to knee surgery.      Prior Functioning/Environment Level of Independence: Independent with assistive device(s)        Comments: prior to knee surgery. Pt states she was using the walker to ambulate short distances with supervision/min assist per pt at SNF and also independent with propelling wheelchair. Pt states she had just recently been able to dress herself at SNF.    OT Diagnosis: Generalized  weakness   OT Problem List: Decreased strength;Decreased knowledge of use of DME or AE   OT Treatment/Interventions: Self-care/ADL training;Patient/family education;Therapeutic activities;DME and/or AE instruction    OT Goals(Current goals can be found in the care plan section) Acute Rehab OT Goals Patient Stated Goal: return to independence.  OT Goal Formulation: With patient Time For Goal Achievement: 12/22/14 Potential to Achieve Goals: Good  OT Frequency: Min 2X/week   Barriers to D/C:            Co-evaluation              End of Session Equipment Utilized During Treatment: Rolling walker  Activity Tolerance: Patient limited by pain Patient left: in chair;with call bell/phone within reach;with chair alarm set   Time: 1240-1307 OT Time Calculation (min): 27 min Charges:  OT General Charges $OT Visit: 1 Procedure OT Evaluation $Initial OT Evaluation Tier I: 1 Procedure OT Treatments $Therapeutic Activity: 8-22 mins G-Codes: OT G-codes **NOT FOR INPATIENT CLASS** Functional Assessment Tool Used: clinical judgement. Functional Limitation: Self care Self Care Current Status [phone]): At least 20 percent but less than 40 percent impaired, limited or restricted Self Care Goal Status (U0454): At least 1 percent but less than 20 percent impaired, limited or restricted  Lennox Laity  [phone] 12/15/2014, 1:21 PM

## 2014-12-15 NOTE — Progress Notes (Signed)
Transported to [facility] by patients brother. PIV removed no s/s of infiltration or swelling noted.

## 2014-12-15 NOTE — Discharge Summary (Signed)
Discharge Summary  [name] BJY:[id number] DOB: 06-04-1932  PCP: Pearson Grippe, MD  Admit date: 12/14/2014 Discharge date: 12/15/2014  Time spent: 25 minutes  Recommendations for Outpatient Follow-up:  1. Medication change: Patient's daily colchicine decreased from 0.6 mg to 0.3 mg due to her renal clearance 2. Diet change: Patient's diet adjusted to dysphagia 3 with thin liquids   Discharge Diagnoses:  Active Hospital Problems   Diagnosis Date Noted  . Aspiration pneumonia 12/14/2014  . Anemia 12/14/2014  . Aspiration into airway 12/14/2014  . Swallowing dysfunction 12/14/2014  . CKD (chronic kidney disease), stage III 12/28/2012  . Hypertension 10/15/2010    Resolved Hospital Problems   Diagnosis Date Noted Date Resolved  No resolved problems to display.    Discharge Condition: Stable  Diet recommendation: Low-sodium dysphagia 3 diet with thin liquids  Filed Weights   12/15/14 0035 12/15/14 0440  Weight: 75.3 kg (166 lb 0.1 oz) 76.2 kg (167 lb 15.9 oz)    History of present illness:  81 year old female with past mental history of stroke, hypertension and anemia who resides at a skilled nursing facility came to the emergency room on 8/25 after eating a piece of cornbread which initially choked her, but she has had continuous coughing since and apparently had some issues with choking we'll try to swallow medicines in the emergency room. Admitted to the hospitalist service for further evaluation.  Hospital Course:  Active Problems:   Hypertension: Blood pressure stable, continue home medications.    CKD (chronic kidney disease), stage III: Stable. Creatinine actually better than in the past  History of gout: Pharmacy recommended patient colchicine dose decreased to 0.3 mg daily for creatinine clearance    Swallowing dysfunction dysphagia: Been by speech therapy who recommended dysphagia 3 diet with thin liquids. Diet downgraded. Given normal white count, no fever  and negative CT, no signs of aspiration pneumonia. Patient's oxygenation has been normal./   Procedures: None  Consultations: Speech therapy  Discharge Exam: BP 138/50 mmHg  Pulse 67  Temp(Src) 97.4 F (36.3 C) (Oral)  Resp 18  Ht 5' (1.524 m)  Wt 76.2 kg (167 lb 15.9 oz)  BMI 32.81 kg/m2  SpO2 96%  General: Alert and oriented 2, no acute distress  Cardiovascular: Regular rate and rhythm, S1-S2  Respiratory: Clear to auscultation bilaterally  Discharge Instructions You were cared for by a hospitalist during your hospital stay. If you have any questions about your discharge medications or the care you received while you were in the hospital after you are discharged, you can call the unit and asked to speak with the hospitalist on call if the hospitalist that took care of you is not available. Once you are discharged, your primary care physician will handle any further medical issues. Please note that NO REFILLS for any discharge medications will be authorized once you are discharged, as it is imperative that you return to your primary care physician (or establish a relationship with a primary care physician if you do not have one) for your aftercare needs so that they can reassess your need for medications and monitor your lab values.  Discharge Instructions    DIET DYS 3    Complete by:  As directed   Fluid consistency:  Thin     Increase activity slowly    Complete by:  As directed             Medication List    STOP taking these medications  esomeprazole 40 MG capsule  Commonly known as:  NEXIUM      TAKE these medications        alendronate 70 MG tablet  Commonly known as:  FOSAMAX  Take 70 mg by mouth once a week. Saturday.     amiodarone 200 MG tablet  Commonly known as:  PACERONE  Take 0.5 tablets (100 mg total) by mouth daily.     BIOTIN 5000 PO  Take 5,000 mcg by mouth every evening.     calcitRIOL 0.25 MCG capsule  Commonly known as:  ROCALTROL   Take 0.25 mcg by mouth every morning.     carvedilol 12.5 MG tablet  Commonly known as:  COREG  Take 1 tablet by mouth 2 (two) times daily.     cetirizine 10 MG tablet  Commonly known as:  ZYRTEC  Take 10 mg by mouth every morning.     colchicine 0.6 MG tablet  Take 0.5 tablets (0.3 mg total) by mouth daily.     cycloSPORINE 0.05 % ophthalmic emulsion  Commonly known as:  RESTASIS  Place 1 drop into both eyes 2 (two) times daily as needed (dry eyes.).     docusate sodium 50 MG capsule  Commonly known as:  COLACE  Take 50 mg by mouth 2 (two) times daily.     donepezil 10 MG tablet  Commonly known as:  ARICEPT  Take 10 mg by mouth at bedtime.     DULoxetine 60 MG capsule  Commonly known as:  CYMBALTA  Take 60 mg by mouth every evening.     enoxaparin 30 MG/0.3ML injection  Commonly known as:  LOVENOX  Inject 30 mg into the skin daily.     ferrous sulfate 325 (65 FE) MG tablet  Take 1 tablet (325 mg total) by mouth 3 (three) times daily after meals.     hydrALAZINE 10 MG tablet  Commonly known as:  APRESOLINE  Take 10 mg by mouth 3 (three) times daily.     HYDROcodone-acetaminophen 5-325 MG per tablet  Commonly known as:  NORCO/VICODIN  Take one tablet by mouth every 6 hours as needed for pain. Do not exceed 4gm of Tylenol in 24 hours     HYDROcodone-acetaminophen 10-325 MG per tablet  Commonly known as:  NORCO  Take one tablet by mouth twice daily for pain. Do not exceed 4gm of Tylenol in 24 hours     ipratropium-albuterol 0.5-2.5 (3) MG/3ML Soln  Commonly known as:  DUONEB  Take 3 mLs by nebulization every morning.     levothyroxine 100 MCG tablet  Commonly known as:  SYNTHROID, LEVOTHROID  Take 100 mcg by mouth every morning.     LINZESS 145 MCG Caps capsule  Generic drug:  Linaclotide  Take 145 mcg by mouth every other day. Alternates with the 290 mg tablet in the am.     Linaclotide 290 MCG Caps capsule  Commonly known as:  LINZESS  Take 290 mcg by  mouth every other day. Alternates with the 145 mg tablets in the morning.     memantine 10 MG tablet  Commonly known as:  NAMENDA  Take 10 mg by mouth 2 (two) times daily.     multivitamin tablet  Take 0.5 tablets by mouth every morning.     nitroGLYCERIN 0.4 MG SL tablet  Commonly known as:  NITROSTAT  Place 0.4 mg under the tongue every 5 (five) minutes as needed for chest pain (MAX 3 TABLETS).  NON FORMULARY  2 Liters oxygen as needed for shortness of breath     omeprazole 20 MG capsule  Commonly known as:  PRILOSEC  Take 20 mg by mouth daily.     polyethylene glycol packet  Commonly known as:  MIRALAX / GLYCOLAX  Take 17 g by mouth 2 (two) times daily.     pravastatin 40 MG tablet  Commonly known as:  PRAVACHOL  Take 40 mg by mouth every evening.     pregabalin 75 MG capsule  Commonly known as:  LYRICA  Take 75 mg by mouth 2 (two) times daily.     torsemide 20 MG tablet  Commonly known as:  DEMADEX  Take 20 mg by mouth 2 (two) times daily. PEr Dr Allena Katz - patient to not take on 8/14 nor am of 8/15     Turmeric 500 MG Caps  Take 1 capsule by mouth 2 (two) times daily.     ULORIC 40 MG tablet  Generic drug:  febuxostat  Take 40 mg by mouth every evening.     vitamin B-12 1000 MCG tablet  Commonly known as:  CYANOCOBALAMIN  Take 1,000 mcg by mouth 2 (two) times a week. Tuesday and Friday     Vitamin D 1000 UNITS capsule  Take 2,000 Units by mouth at bedtime.     warfarin 5 MG tablet  Commonly known as:  COUMADIN  Take 1 tablet by mouth daily.     warfarin 2.5 MG tablet  Commonly known as:  COUMADIN  Take 1 tablet (2.5 mg total) by mouth one time only at 6 PM.       Allergies  Allergen Reactions  . Codeine Hives  . Darvocet [Propoxyphene N-Acetaminophen] Hives  . Nubain [Nalbuphine Hcl] Hives    Went into cardiac arrest       The results of significant diagnostics from this hospitalization (including imaging, microbiology, ancillary and  laboratory) are listed below for reference.    Significant Diagnostic Studies: Dg Chest 2 View  11/29/2014   CLINICAL DATA:  Preop for right knee replacement.  EXAM: CHEST  2 VIEW  COMPARISON:  December 23, 2012.  FINDINGS: Stable cardiomediastinal silhouette. Status post aortic valve replacement. Right-sided pacemaker is unchanged in position. No pneumothorax or pleural effusion is noted. Mild multilevel degenerative disc disease is noted in the thoracic spine. No acute pulmonary disease is noted.  IMPRESSION: No active cardiopulmonary disease.   Electronically Signed   By: Lupita Raider, M.D.   On: 11/29/2014 15:51   Ct Chest Wo Contrast  12/14/2014   CLINICAL DATA:  Choked on food during dinner, coughed up 2 pieces of core bread, ongoing cough, mild stridor, history hypertension, hyperlipidemia, atrial fibrillation, stroke, pulmonary embolism, dementia, chronic kidney disease  EXAM: CT CHEST WITHOUT CONTRAST  TECHNIQUE: Multidetector CT imaging of the chest was performed following the standard protocol without IV contrast. Sagittal and coronal MPR images reconstructed from axial data set.  COMPARISON:  CTA chest 11/10/2012  FINDINGS: Atherosclerotic calcifications aorta, coronary arteries and proximal great vessels.  Post TAVR.  Pacemaker leads in RIGHT atrium and RIGHT ventricle.  Visualized upper abdomen unremarkable.  Moderate-sized hiatal hernia.  Normal size mediastinal lymph nodes without definite thoracic adenopathy.  Beam hardening artifacts from pacemaker generator anterior RIGHT chest wall.  Thoracic esophagus to the air-filled with otherwise unremarkable.  No office esophageal mass or foreign body.  Respiratory motion artifacts from patient coughing degrade assessment of the lungs.  No definite pulmonary infiltrate,  pleural effusion, or pneumothorax.  Trachea is markedly narrow distally in its AP dimension, suspect tracheomalacia in the setting of expiration.  No acute osseous findings.   Sclerotic focus within posterior aspect of the T5 vertebral body appears unchanged since 2014.  IMPRESSION: Extensive atherosclerotic disease.  Prior TAVR and pacemaker.  Question tracheomalacia.  No additional acute abnormalities identified within limits imposed by respiratory motion artifacts.   Electronically Signed   By: Ulyses Southward M.D.   On: 12/14/2014 20:32    Microbiology: Recent Results (from the past 240 hour(s))  MRSA PCR Screening     Status: None   Collection Time: 12/15/14  3:11 AM  Result Value Ref Range Status   MRSA by PCR NEGATIVE NEGATIVE Final    Comment:        The GeneXpert MRSA Assay (FDA approved for NASAL specimens only), is one component of a comprehensive MRSA colonization surveillance program. It is not intended to diagnose MRSA infection nor to guide or monitor treatment for MRSA infections.      Labs: Basic Metabolic Panel:  Recent Labs Lab 12/14/14 2000 12/15/14 0127  NA 141 140  K 4.3 4.1  CL 97* 97*  CO2 33* 34*  GLUCOSE 116* 107*  BUN 77* 71*  CREATININE 2.63* 2.54*  CALCIUM 9.6 9.2   Liver Function Tests:  Recent Labs Lab 12/15/14 0127  AST 48*  ALT 32  ALKPHOS 68  BILITOT 0.7  PROT 5.7*  ALBUMIN 3.2*   No results for input(s): LIPASE, AMYLASE in the last 168 hours. No results for input(s): AMMONIA in the last 168 hours. CBC:  Recent Labs Lab 12/14/14 2000 12/15/14 0127  WBC 8.4 8.0  NEUTROABS 5.2  --   HGB 8.3* 8.2*  HCT 25.7* 25.4*  MCV 95.9 96.2  PLT 269 261   Cardiac Enzymes: No results for input(s): CKTOTAL, CKMB, CKMBINDEX, TROPONINI in the last 168 hours. BNP: BNP (last 3 results) No results for input(s): BNP in the last 8760 hours.  ProBNP (last 3 results) No results for input(s): PROBNP in the last 8760 hours.  CBG:  Recent Labs Lab 12/15/14 0737  GLUCAP 104*       Signed:  Hollice Espy  [facility] 12/15/2014, 12:12 PM

## 2014-12-15 NOTE — Discharge Instructions (Signed)
Dysphagia Level 3 Diet, Mechanically Advanced °The dysphagia level 3 diet includes foods that are soft, moist, and can be chopped into 1-inch chunks. This diet is helpful for people with mild swallowing difficulties. It reduces the risk of food getting caught in the windpipe, trachea, or lungs. °WHAT DO I NEED TO KNOW ABOUT THIS DIET? °· You may eat foods that are soft and moist. °· If you were on the dysphagia level 1 or level 2 diets, you may eat any of the foods included on those lists. °· Avoid foods that are dry, hard, sticky, chewy, coarse, and crunchy. Also avoid large cuts of food. °· Take small bites. Each bite should contain 1 inch or less of food. °· Thicken liquids if instructed by your health care provider. Follow your health care provider's instructions on how to do this and to what consistency. °· See your dietitian or speech language pathologist regularly for help with your dietary changes. °WHAT FOODS CAN I EAT? °Grains °Moist breads without nuts or seeds. Biscuits, muffins, pancakes, and waffles well-moistened with syrup, jelly, margarine, or butter. Smooth cereals with plenty of milk to moisten them. Moist bread stuffing. Moist rice. °Vegetables °All cooked, soft vegetables. Shredded lettuce. Tender fried potatoes. °Fruits °All canned and cooked fruits. Soft, peeled fresh fruits, such as peaches, nectarines, kiwis, cantaloupe, honeydew melon, and watermelon without seeds. Soft berries, such as strawberries. °Meat and Other Protein Sources °Moist ground or finely diced or sliced meats. Solid, tender cuts of meat. Meatloaf. Hamburger with a bun. Sausage patty. Deli thin-sliced lunch meat. Chicken, egg, or tuna salad sandwich. Sloppy joe. Moist fish. Eggs prepared any way. Casseroles with small chunks of meats, ground meats, or tender meats. °Dairy °Cheese spreads without coarse large chunks. Shredded cheese. Cheese slices. Cottage cheese. Milk at the right texture. Smooth frappes. Yogurt without  nuts or coconut. Ask your health care provider whether you can have frozen desserts (such as malts or milk shakes) and thin liquids. °Sweets/Desserts °Soft, smooth, moist desserts. Non-chewy, smooth candy. Jam. Jelly. Honey. Preserves. Ask your health care provider whether you can have frozen desserts. °Fats and Oils °Butter. Oils. Margarine. Mayonnaise. Gravy. Spreads. °Other °All seasonings and sweeteners. All sauces without large chunks. °The items listed above may not be a complete list of recommended foods or beverages. Contact your dietitian for more options. °WHAT FOODS ARE NOT RECOMMENDED? °Grains °Coarse or dry cereals. Dry breads. Toast. Crackers. Tough, crusty breads, such French bread and baguettes. Tough, crisp fried potatoes. Potato skins. Dry bread stuffing. Granola. Popcorn. Chips. °Vegetables °All raw vegetables except shredded lettuce. Cooked corn. Rubbery or stiff cooked vegetables. Stringy vegetables, such as celery. °Fruits °Hard fruits that are difficult to chew, such as apples or pears. Stringy, high-pulp fruits, such as pineapple, papaya, or mango. Fruits with tough skins, such as grapes. Coconut. All dried fruits. Fruit leather. Fruit roll-ups. Fruit snacks. °Meat and Other Protein Sources °Dry or tough meats or poultry. Dry fish. Fish with bones. Peanut butter. All nuts and seeds. °Dairy  °Any with nuts, seeds, chocolate chips, dried fruit, coconut, or pineapple. °Sweets/Desserts °Dry cakes. Chewy or dry cookies. Any with nuts, seeds, dry fruits, coconut, pineapple, or anything dry, sticky, or hard. Chewy caramel. Licorice. Taffy-type candies. Ask your health care provider whether you can have frozen desserts. °Fats and Oils °Any with chunks, nuts, seeds, or pineapple. Olives. Pickles. °Other °Soups with tough or large chunks of meats, poultry, or vegetables. Corn or clam chowder. °The items listed above may not be   a complete list of foods and beverages to avoid. Contact your dietitian for  more information. °Document Released: 04/07/2005 Document Revised: 12/08/2012 Document Reviewed: 03/21/2013 °Exit[employer]® Patient Information ©2015 [employer]. This information is not intended to replace advice given to you by your health care provider. Make sure you discuss any questions you have with your health care provider. ° °

## 2014-12-15 NOTE — Progress Notes (Signed)
Utilization review completed.  

## 2014-12-15 NOTE — Evaluation (Signed)
Clinical/Bedside Swallow Evaluation Patient Details  Name: [name] MRN: [id number] Date of Birth: 03-02-1933  Today's Date: 12/15/2014 Time: SLP Start Time (ACUTE ONLY): 1015 SLP Stop Time (ACUTE ONLY): 1025 SLP Time Calculation (min) (ACUTE ONLY): 10 min  Past Medical History:  Past Medical History  Diagnosis Date  . Syncope   . Symptomatic bradycardia   . Chronic lower back pain   . Nonischemic cardiomyopathy   . HTN (hypertension)   . HLD (hyperlipidemia)   . Atrial fibrillation     tachy-brady syndrome with <1% recurrent PAF since pacemaker placement  . H/O: stroke   . Pulmonary embolism     HISTORY OF, the pt. had a recurrent bilateral pulmonary emboli in 2005, on warfarin therapy and at which time she under went implantation of IVC filter  . Anemia   . Dementia   . Gastroesophageal reflux disease   . Aortic stenosis 10/13/2012    Low EF, low gradient with severe aortic stenosis confirmed by dobutamine stress echocardiogram s/p TAVR 12/2012  . PONV (postoperative nausea and vomiting)   . Osteoarthritis   . Neuropathy   . Spinal stenosis   . Symptomatic bradycardia 2012    s/p Medtronic PPM  . Chronic combined systolic and diastolic CHF (congestive heart failure)   . Fibromyalgia   . Presence of permanent cardiac pacemaker   . Sleep apnea     uses oxygen at night and PRN- not used since > 6 months   . Shortness of breath dyspnea     with exertion   . Pneumonia     hx of x 3   . Acute on chronic renal failure     sees Dr Allena Katz   . CKD (chronic kidney disease)   . Headache     hx of migraines   . On home oxygen therapy     patient uses at nite- 2L- has not used in > 6 months per patient   Past Surgical History:  Past Surgical History  Procedure Laterality Date  . Pacemaker insertion      Medtronic  . Transcatheter aortic valve replacement, transfemoral  12/21/2012    a. 29mm Edwards Sapien XT transcatheter heart valve placed via open left transfemoral  approach b. Intra-op TEE: well-seated bioprosthetic aortic valve with mean gradient 2 mmHg, trivial AI, mild MR, EF 30-35%  . Transcatheter aortic valve replacement, transfemoral N/A 12/21/2012    Procedure: TRANSCATHETER AORTIC VALVE REPLACEMENT, TRANSFEMORAL;  Surgeon: Tonny Bollman, MD;  Location[facility] OR;  Service: Open Heart Surgery;  Laterality: N/A;  . Intraoperative transesophageal echocardiogram N/A 12/21/2012    Procedure: INTRAOPERATIVE TRANSESOPHAGEAL ECHOCARDIOGRAM;  Surgeon: Tonny Bollman, MD;  Location: [facility] OR;  Service: Open Heart Surgery;  Laterality: N/A;  . Central venous catheter insertion Left 12/21/2012    Procedure: INSERTION CENTRAL LINE ADULT;  Surgeon: Tonny Bollman, MD;  Location: [facility] OR;  Service: Open Heart Surgery;  Laterality: Left;  . Cataract extraction    . Bunionectomy    . Appendectomy    . Abdominal hysterectomy    . Thyroidectomy, partial    . Back surgery    . Knee surgery Left   . Nose surgery      X 2  . Left and right heart catheterization with coronary angiogram N/A 10/18/2012    Procedure: LEFT AND RIGHT HEART CATHETERIZATION WITH CORONARY ANGIOGRAM;  Surgeon: Tonny Bollman, MD;  Location: [facility] CATH LAB;  Service: Cardiovascular;  Laterality: N/A;  . Nasal septum surgery    .  Total knee arthroplasty Right 12/04/2014    Procedure: RIGHT TOTAL  KNEE ARTHROPLASTY;  Surgeon: Durene Romans, MD;  Location: [facility] ORS;  Service: Orthopedics;  Laterality: Right;   HPI:  81 y.o. female with prior historuy of HTN,HLD, Stroke, recent TKA, GERD presents after an aspiration event. The patient was apparently eating cornbread and it reportedly choked her. She staes that since the event she has done nothing but cough. She did apparently cough up a couple of pieces but still feels as there is somehting in her chest. Patient had a CT scan done and this shows no obvious obstruction but there was a lot of motion artifact. She has no fever noted at this time. She has no nausea or  vomiting noted. In the ED attempted at swallowing medications but began to choke on them so she is being admitted for observation. Patient resides at a rehab facility.    Assessment / Plan / Recommendation Clinical Impression  Pt presents with a baseline cough that does not appear to be associated with PO intake.  Pt demonstrates functional swallow with adequate mastication despite absence of dentition; brisk swallow response; no overt s/s of aspiration.  Pt reports ongoing difficulty swallowing pills with water; she takes them with purees at SNF.  Doubt choking incident reflects a chronic dysphagia - recommend resuming a mechanical soft diet, thin liquids, give meds with puree.  No further SLP f/u is warranted.      Aspiration Risk  Mild    Diet Recommendation Dysphagia 3 (Mech soft);Thin   Medication Administration: Whole meds with puree    Other  Recommendations Oral Care Recommendations: Oral care BID   SLP Swallow Goals     Swallow Study Prior Functional Status       General Date of Onset: 12/14/14 Other Pertinent Information: 81 y.o. female with prior historuy of HTN,HLD, Stroke, recent TKA, presents after an aspiration event. The patient was apparently eating cornbread and it reportedly choked her. She staes that since the event she has done nothing but cough. She did apparently cough up a couple of pieces but still feels as there is somehting in her chest. Patient had a CT scan done and this shows no obvious obstruction but there was a lot of motion artifact. She has no fever noted at this time. She has no nausea or vomiting noted. In the ED attempted at swallowing medications but began to choke on them so she is being admitted for observation. Patient resides at a rehab facility.  Type of Study: Bedside swallow evaluation Previous Swallow Assessment: 2014 Diet Prior to this Study: NPO Temperature Spikes Noted: No Respiratory Status: Supplemental O2 delivered via  (comment) History of Recent Intubation: No Behavior/Cognition: Alert;Cooperative;Pleasant mood Oral Cavity - Dentition: Edentulous Self-Feeding Abilities: Able to feed self Patient Positioning: Upright in bed Baseline Vocal Quality: Normal Volitional Cough: Strong Volitional Swallow: Able to elicit    Oral/Motor/Sensory Function Overall Oral Motor/Sensory Function: Appears within functional limits for tasks assessed   Ice Chips Ice chips: Not tested   Thin Liquid Thin Liquid: Within functional limits Presentation: Cup;Self Fed    Nectar Thick Nectar Thick Liquid: Not tested   Honey Thick Honey Thick Liquid: Not tested   Puree Puree: Within functional limits Presentation: Self Fed   Solid   GO Functional Assessment Tool Used: clinical judgement Functional Limitations: Swallowing Swallow Current Status ([id number]): At least 1 percent but less than 20 percent impaired, limited or restricted Swallow Goal Status [phone]): At least 1 percent  but less than 20 percent impaired, limited or restricted Swallow Discharge Status [phone]): At least 1 percent but less than 20 percent impaired, limited or restricted  Solid: Within functional limits Presentation: Self Fed       Blenda Mounts Laurice 12/15/2014,10:43 AM

## 2014-12-15 NOTE — Progress Notes (Addendum)
Patient is set to discharge back to [facility] today. Patient & son, Onalee Hua aware. Discharge packet given to RN, Delorise Shiner. Patient's brother to transport back to SNF.     Lincoln Maxin, LCSW [facility] Clinical Social Worker cell #: [phone]

## 2014-12-15 NOTE — Evaluation (Signed)
Physical Therapy One Time Evaluation Patient Details Name: [name] MRN: [id number] DOB: [birth year] Today's Date: 12/15/2014   History of Present Illness  81 year old female with past mental history of stroke, hypertension anemia, recent R TKA on 12/04/14 who was receiving rehab at a skilled nursing facility. Admitted on 8/25 after eating a piece of cornbread which initially choked her  Clinical Impression  Patient evaluated by Physical Therapy with no further acute PT needs identified. All education has been completed and the patient has no further questions.  Pt reports d/c back to SNF today however agreeable to mobilize and perform some knee motion to continue therapy since she was at SNF for rehab after R TKA.  Pt reports PT yesterday really stretched her knee so she is very sore today however agreeable to AAROM knee flexion to tolerance.  Pt encouraged not to allow pillow under knee as precaution for maintaining/improving knee extension (states this has happened since surgery). See below for any follow-up Physical Therapy or equipment needs. PT is signing off since pt plans to d/c back to SNF today. Thank you for this referral.     Follow Up Recommendations SNF    Equipment Recommendations  None recommended by PT    Recommendations for Other Services       Precautions / Restrictions Precautions Precautions: Fall;Knee Precaution Comments: recent R TKA on 12/04/14 Restrictions Other Position/Activity Restrictions: WBAT      Mobility  Bed Mobility Overal bed mobility: Needs Assistance Bed Mobility: Sit to Supine     Supine to sit: Min guard;HOB elevated Sit to supine: Min assist   General bed mobility comments:  pt with difficulty lifting LEs however wished to attempt on her own, slight assist to keep LEs on bed once lifted  Transfers Overall transfer level: Needs assistance Equipment used: Rolling walker (2 wheeled) Transfers: Sit to/from Stand Sit to Stand: Min  assist Stand pivot transfers: Min assist       General transfer comment: slight assist to rise and control descent  Ambulation/Gait Ambulation/Gait assistance: Min guard Ambulation Distance (Feet): 20 Feet Assistive device: Rolling walker (2 wheeled) Gait Pattern/deviations: Step-to pattern;Antalgic;Trunk flexed;Decreased stance time - right     General Gait Details: verbal cues for RW positioning, cues for posture however pt reports trunk flexion her baseline due to back pain, fatigues quickly, ambulated to [employer]    Modified Rankin (Stroke Patients Only)       Balance Overall balance assessment: Needs assistance   Sitting balance-Leahy Scale: Good       Standing balance-Leahy Scale: Poor                               Pertinent Vitals/Pain Pain Assessment: 0-10 Pain Score: 5  Pain Location: R knee, back, feet, "almost everywhere" Pain Descriptors / Indicators: Aching Pain Intervention(s): Repositioned;Ice applied (ice packs to knee)    Home Living Family/patient expects to be discharged to:: Skilled nursing facility                 Additional Comments: at SNF for rehab. lived with son prior to knee surgery.    Prior Function Level of Independence: Independent with assistive device(s)         Comments: prior to knee surgery. Pt states she was using the walker to ambulate short distances with supervision/min assist per pt  at SNF and also independent with propelling wheelchair. Pt states she had just recently been able to dress herself at SNF.     Hand Dominance   Dominant Hand: Right    Extremity/Trunk Assessment   Upper Extremity Assessment: Generalized weakness           Lower Extremity Assessment: RLE deficits/detail;LLE deficits/detail RLE Deficits / Details: knee AAROM -8-110* supine limited by pain LLE Deficits / Details: WFL  Cervical / Trunk Assessment:  Kyphotic;Other exceptions  Communication   Communication: No difficulties  Cognition Arousal/Alertness: Awake/alert Behavior During Therapy: WFL for tasks assessed/performed Overall Cognitive Status: Within Functional Limits for tasks assessed                 General Comments: HOH    General Comments      Exercises Total Joint Exercises Heel Slides: AAROM;Right;10 reps;Supine      Assessment/Plan    PT Assessment All further PT needs can be met in the next venue of care  PT Diagnosis Difficulty walking   PT Problem List Decreased mobility;Decreased range of motion;Decreased strength;Decreased knowledge of use of DME;Pain  PT Treatment Interventions     PT Goals (Current goals can be found in the Care Plan section) Acute Rehab PT Goals Patient Stated Goal: return to independence.  PT Goal Formulation: All assessment and education complete, DC therapy    Frequency     Barriers to discharge        Co-evaluation               End of Session Equipment Utilized During Treatment: Gait belt Activity Tolerance: Patient limited by fatigue;Patient limited by pain Patient left: with call bell/phone within reach;in bed;with bed alarm set      Functional Assessment Tool Used: clinical judgement Functional Limitation: Mobility: Walking and moving around Mobility: Walking and Moving Around Current Status (Y1856): At least 20 percent but less than 40 percent impaired, limited or restricted Mobility: Walking and Moving Around Goal Status [phone]): At least 20 percent but less than 40 percent impaired, limited or restricted Mobility: Walking and Moving Around Discharge Status [phone]): At least 20 percent but less than 40 percent impaired, limited or restricted    Time: 1340-1400 PT Time Calculation (min) (ACUTE ONLY): 20 min   Charges:   PT Evaluation $Initial PT Evaluation Tier I: 1 Procedure     PT G Codes:   PT G-Codes **NOT FOR INPATIENT CLASS** Functional  Assessment Tool Used: clinical judgement Functional Limitation: Mobility: Walking and moving around Mobility: Walking and Moving Around Current Status (C5885): At least 20 percent but less than 40 percent impaired, limited or restricted Mobility: Walking and Moving Around Goal Status [phone]): At least 20 percent but less than 40 percent impaired, limited or restricted Mobility: Walking and Moving Around Discharge Status [phone]): At least 20 percent but less than 40 percent impaired, limited or restricted    Norvel Wenker,KATHrine E 12/15/2014, 3:14 PM Carmelia Bake, PT, DPT 12/15/2014 Pager: [phone]

## 2014-12-15 NOTE — Progress Notes (Signed)
Called [facility] 2x to give report was transferred to nurses to stn but nobodys picking up, tried to call the supvr's. cellphone number but no one answers. Called again left msg and my number.

## 2014-12-15 NOTE — Progress Notes (Signed)
PHARMACIST - PHYSICIAN COMMUNICATION  CONCERNING: P&T Medication Policy Regarding Oral Bisphosphonates  RECOMMENDATION: Your order for alendronate (Fosamax), ibandronate (Boniva), or risedronate (Actonel) has been discontinued at this time.  If the patient's post-hospital medical condition warrants safe use of this class of drugs, please resume the pre-hospital regimen upon discharge.  DESCRIPTION:  Alendronate (Fosamax), ibandronate (Boniva), and risedronate (Actonel) can cause severe esophageal erosions in patients who are unable to remain upright at least 30 minutes after taking this medication.   Since brief interruptions in therapy are thought to have minimal impact on bone mineral density, the Pharmacy & Therapeutics Committee has established that bisphosphonate orders should be routinely discontinued during hospitalization.   To override this safety policy and permit administration of Boniva, Fosamax, or Actonel in the hospital, prescribers must write "DO NOT HOLD" in the comments section when placing the order for this class of medications.  Thanks, Lorenza Evangelist 12/15/2014 12:48 AM

## 2014-12-16 LAB — HEMOGLOBIN A1C
Hgb A1c MFr Bld: 5.5 % (ref 4.8–5.6)
MEAN PLASMA GLUCOSE: 111 mg/dL

## 2014-12-18 LAB — FOLATE RBC
Folate, Hemolysate: >620 ng/mL
Hematocrit: 24.8 % — ABNORMAL LOW (ref 34.0–46.6)

## 2014-12-22 ENCOUNTER — Encounter: Payer: Self-pay | Admitting: Adult Health

## 2014-12-22 ENCOUNTER — Non-Acute Institutional Stay (SKILLED_NURSING_FACILITY): Payer: Medicare Other | Admitting: Adult Health

## 2014-12-22 DIAGNOSIS — J309 Allergic rhinitis, unspecified: Secondary | ICD-10-CM | POA: Diagnosis not present

## 2014-12-22 DIAGNOSIS — N183 Chronic kidney disease, stage 3 unspecified: Secondary | ICD-10-CM

## 2014-12-22 DIAGNOSIS — I10 Essential (primary) hypertension: Secondary | ICD-10-CM | POA: Diagnosis not present

## 2014-12-22 DIAGNOSIS — M1000 Idiopathic gout, unspecified site: Secondary | ICD-10-CM

## 2014-12-22 DIAGNOSIS — K5900 Constipation, unspecified: Secondary | ICD-10-CM

## 2014-12-22 DIAGNOSIS — M1711 Unilateral primary osteoarthritis, right knee: Secondary | ICD-10-CM

## 2014-12-22 DIAGNOSIS — K219 Gastro-esophageal reflux disease without esophagitis: Secondary | ICD-10-CM

## 2014-12-22 DIAGNOSIS — F0390 Unspecified dementia without behavioral disturbance: Secondary | ICD-10-CM

## 2014-12-22 DIAGNOSIS — F329 Major depressive disorder, single episode, unspecified: Secondary | ICD-10-CM

## 2014-12-22 DIAGNOSIS — I480 Paroxysmal atrial fibrillation: Secondary | ICD-10-CM | POA: Diagnosis not present

## 2014-12-22 DIAGNOSIS — E43 Unspecified severe protein-calorie malnutrition: Secondary | ICD-10-CM

## 2014-12-22 DIAGNOSIS — G629 Polyneuropathy, unspecified: Secondary | ICD-10-CM | POA: Diagnosis not present

## 2014-12-22 DIAGNOSIS — E785 Hyperlipidemia, unspecified: Secondary | ICD-10-CM

## 2014-12-22 DIAGNOSIS — K5909 Other constipation: Secondary | ICD-10-CM

## 2014-12-22 DIAGNOSIS — F32A Depression, unspecified: Secondary | ICD-10-CM

## 2014-12-22 DIAGNOSIS — D638 Anemia in other chronic diseases classified elsewhere: Secondary | ICD-10-CM

## 2014-12-25 NOTE — Progress Notes (Signed)
Patient ID: [name], female   DOB: 01/22/1933, 81 y.o.   MRN: [id number]    DATE:  12/22/14 MRN:  [id number]  BIRTHDAY: 09/04/1932  Facility:  Nursing Home Location:  [facility]  Nursing Home Room Number: [id number]  LEVEL OF CARE:  SNF [phone])  Contact Information    Name Relation Home Work [place] Son [phone]  [phone]   Gray,Sharon Daughter [phone]  [phone]      Chief Complaint  Patient presents with  . Discharge Note    Osteoarthritis S/P right total knee arthroplasty, atrial fibrillation, hypertension, allergic rhinitis, gout, chronic constipation, dementia, depression, GERD, hyperlipidemia, neuropathy, anemia and chronic kidney disease    HISTORY OF PRESENT ILLNESS:  This is an 81 year old female who is for discharge home with Home health PT for endurance, OT for ADLs and CNA for showers. She has been admitted to [facility] on 12/07/14 from [facility]. She has PMH of syncope, chronic lower back pain, nonischemic cardiomyopathy, hypertension, hyperlipidemia, atrial fibrillation, history of stroke, PE (2005), anemia, dementia, GERD,  aortic stenosis, osteoarthritis, neuropathy, sleep apnea uses O2 at night and when necessary, chronic kidney disease and combined systolic and diastolic CHF. She has osteoarthritis and had right total knee arthroplasty on 12/04/14.   Patient was admitted to this facility for short-term rehabilitation after the patient's recent hospitalization.  Patient has completed SNF rehabilitation and therapy has cleared the patient for discharge.   PAST MEDICAL HISTORY:  Past Medical History  Diagnosis Date  . Syncope   . Symptomatic bradycardia   . Chronic lower back pain   . Nonischemic cardiomyopathy   . HTN (hypertension)   . HLD (hyperlipidemia)   . Atrial fibrillation     tachy-brady syndrome with <1% recurrent PAF since pacemaker placement  . H/O: stroke   . Pulmonary embolism    HISTORY OF, the pt. had a recurrent bilateral pulmonary emboli in 2005, on warfarin therapy and at which time she under went implantation of IVC filter  . Anemia   . Dementia   . Gastroesophageal reflux disease   . Aortic stenosis 10/13/2012    Low EF, low gradient with severe aortic stenosis confirmed by dobutamine stress echocardiogram s/p TAVR 12/2012  . PONV (postoperative nausea and vomiting)   . Osteoarthritis   . Neuropathy   . Spinal stenosis   . Symptomatic bradycardia 2012    s/p [employer] PPM  . Chronic combined systolic and diastolic CHF (congestive heart failure)   . Fibromyalgia   . Presence of permanent cardiac pacemaker   . Sleep apnea     uses oxygen at night and PRN- not used since > 6 months   . Shortness of breath dyspnea     with exertion   . Pneumonia     hx of x 3   . Acute on chronic renal failure     sees Dr Allena Katz   . CKD (chronic kidney disease)   . Headache     hx of migraines   . On home oxygen therapy     patient uses at nite- 2L- has not used in > 6 months per patient     CURRENT MEDICATIONS: Reviewed      Medication List       This list is accurate as of: 12/22/14 11:59 PM.  Always use your most recent med list.               alendronate 70 MG  tablet  Commonly known as:  FOSAMAX  Take 70 mg by mouth once a week. Saturday.     amiodarone 200 MG tablet  Commonly known as:  PACERONE  Take 0.5 tablets (100 mg total) by mouth daily.     BIOTIN 5000 PO  Take 5,000 mcg by mouth every evening.     calcitRIOL 0.25 MCG capsule  Commonly known as:  ROCALTROL  Take 0.25 mcg by mouth every morning.     carvedilol 12.5 MG tablet  Commonly known as:  COREG  Take 1 tablet by mouth 2 (two) times daily.     cetirizine 10 MG tablet  Commonly known as:  ZYRTEC  Take 10 mg by mouth every morning.     colchicine 0.6 MG tablet  Take 0.5 tablets (0.3 mg total) by mouth daily.     cycloSPORINE 0.05 % ophthalmic emulsion  Commonly known as:   RESTASIS  Place 1 drop into both eyes 2 (two) times daily as needed (dry eyes.).     docusate sodium 50 MG capsule  Commonly known as:  COLACE  Take 50 mg by mouth 2 (two) times daily.     donepezil 10 MG tablet  Commonly known as:  ARICEPT  Take 10 mg by mouth at bedtime.     DULoxetine 60 MG capsule  Commonly known as:  CYMBALTA  Take 60 mg by mouth every evening.     ferrous sulfate 325 (65 FE) MG tablet  Take 1 tablet (325 mg total) by mouth 3 (three) times daily after meals.     hydrALAZINE 10 MG tablet  Commonly known as:  APRESOLINE  Take 10 mg by mouth 3 (three) times daily.     HYDROcodone-acetaminophen 5-325 MG per tablet  Commonly known as:  NORCO/VICODIN  Take 1-2 tablets by mouth every 6 (six) hours as needed for moderate pain.     ipratropium-albuterol 0.5-2.5 (3) MG/3ML Soln  Commonly known as:  DUONEB  Take 3 mLs by nebulization every morning.     levothyroxine 100 MCG tablet  Commonly known as:  SYNTHROID, LEVOTHROID  Take 100 mcg by mouth every morning.     LINZESS 145 MCG Caps capsule  Generic drug:  Linaclotide  Take 145 mcg by mouth every other day. Alternates with the 290 mg tablet in the am.     Linaclotide 290 MCG Caps capsule  Commonly known as:  LINZESS  Take 290 mcg by mouth every other day. Alternates with the 145 mg tablets in the morning.     memantine 10 MG tablet  Commonly known as:  NAMENDA  Take 10 mg by mouth 2 (two) times daily.     multivitamin tablet  Take 0.5 tablets by mouth every morning.     nitroGLYCERIN 0.4 MG SL tablet  Commonly known as:  NITROSTAT  Place 0.4 mg under the tongue every 5 (five) minutes as needed for chest pain (MAX 3 TABLETS).     NON FORMULARY  2 Liters oxygen as needed for shortness of breath     omeprazole 20 MG capsule  Commonly known as:  PRILOSEC  Take 20 mg by mouth daily.     polyethylene glycol packet  Commonly known as:  MIRALAX / GLYCOLAX  Take 17 g by mouth 2 (two) times daily.       pravastatin 40 MG tablet  Commonly known as:  PRAVACHOL  Take 40 mg by mouth every evening.     pregabalin 75 MG capsule  Commonly known as:  LYRICA  Take 75 mg by mouth 2 (two) times daily.     torsemide 20 MG tablet  Commonly known as:  DEMADEX  Take 20 mg by mouth 2 (two) times daily. PEr Dr Allena Katz - patient to not take on 8/14 nor am of 8/15     Turmeric 500 MG Caps  Take 1 capsule by mouth 2 (two) times daily.     ULORIC 40 MG tablet  Generic drug:  febuxostat  Take 40 mg by mouth every evening.     vitamin B-12 1000 MCG tablet  Commonly known as:  CYANOCOBALAMIN  Take 1,000 mcg by mouth 2 (two) times a week. Tuesday and Friday     Vitamin D 1000 UNITS capsule  Take 2,000 Units by mouth at bedtime.     warfarin 5 MG tablet  Commonly known as:  COUMADIN  Take 1 tablet by mouth daily.         Allergies  Allergen Reactions  . Codeine Hives  . Darvocet [Propoxyphene N-Acetaminophen] Hives  . Nubain [Nalbuphine Hcl] Hives    Went into cardiac arrest      REVIEW OF SYSTEMS:  GENERAL: no change in appetite, no fatigue, no weight changes, no fever, chills or weakness EYES: Denies change in vision, dry eyes, eye pain, itching or discharge EARS: Denies change in hearing, ringing in ears, or earache NOSE: Denies nasal congestion or epistaxis MOUTH and THROAT: Denies oral discomfort, gingival pain or bleeding, pain from teeth or hoarseness   RESPIRATORY: no cough, SOB, DOE, wheezing, hemoptysis CARDIAC: no chest pain, edema or palpitations GI: no abdominal pain, diarrhea, constipation, heart burn, nausea or vomiting GU: Denies dysuria, frequency, hematuria, incontinence, or discharge PSYCHIATRIC: Denies feeling of depression or anxiety. No report of hallucinations, insomnia, paranoia, or agitation   PHYSICAL EXAMINATION  GENERAL APPEARANCE: Well nourished. In no acute distress. Obese SKIN:  Right knee surgical wound has dry dressing, no erythema HEAD: Normal in  size and contour. No evidence of trauma EYES: Lids open and close normally. No blepharitis, entropion or ectropion. PERRL. Conjunctivae are clear and sclerae are white. Lenses are without opacity EARS: Pinnae are normal. Patient hears normal voice tunes of the examiner MOUTH and THROAT: Lips are without lesions. Oral mucosa is moist and without lesions. Tongue is normal in shape, size, and color and without lesions NECK: supple, trachea midline, no neck masses, no thyroid tenderness, no thyromegaly LYMPHATICS: no LAN in the neck, no supraclavicular LAN RESPIRATORY: breathing is even & unlabored, BS CTAB CARDIAC: RRR, no murmur,no extra heart sounds, no edema, right chest pacemaker GI: abdomen soft, normal BS, no masses, no tenderness, no hepatomegaly, no splenomegaly EXTREMITIES:  Able to move 4 extremities  PSYCHIATRIC: Alert and oriented X 3. Affect and behavior are appropriate  LABS/RADIOLOGY: Labs reviewed: 12/21/14  chest x-ray shows no acute cardiopulmonary pathology; WBC 6.5 hemoglobin 9.7 hematocrit 31.6 MCV 100.0 platelet 265 12/11/14  WBC 5.5 hemoglobin 7.7 hematocrit 23.9 MCV 94.5 platelet count 248 sodium 142 potassium 4.2 glucose 82 BUN 73 creatinine 2.33 total bilirubin 0.5 alkaline phosphatase 57 SGOT 38 SGPT 16 total protein 4.9 albumin 2.8 calcium 8.8 Basic Metabolic Panel:  Recent Labs  70/17/79 0530 12/14/14 2000 12/15/14 0127  NA 133* 141 140  K 4.2 4.3 4.1  CL 99* 97* 97*  CO2 26 33* 34*  GLUCOSE 90 116* 107*  BUN 94* 77* 71*  CREATININE 3.37* 2.63* 2.54*  CALCIUM 7.5* 9.6 9.2   CBC:  Recent Labs  12/07/14 0530 12/14/14 2000 12/15/14 0127  WBC 9.0 8.4 8.0  NEUTROABS  --  5.2  --   HGB 8.2* 8.3* 8.2*  HCT 25.5* 25.7* 25.4*  24.8*  MCV 93.8 95.9 96.2  PLT 170 269 261   CBG:  Recent Labs  12/07/14 1211 12/15/14 0737  GLUCAP 144* 104*     Dg Chest 2 View  11/29/2014   CLINICAL DATA:  Preop for right knee replacement.  EXAM: CHEST  2 VIEW   COMPARISON:  December 23, 2012.  FINDINGS: Stable cardiomediastinal silhouette. Status post aortic valve replacement. Right-sided pacemaker is unchanged in position. No pneumothorax or pleural effusion is noted. Mild multilevel degenerative disc disease is noted in the thoracic spine. No acute pulmonary disease is noted.  IMPRESSION: No active cardiopulmonary disease.   Electronically Signed   By: Lupita Raider, M.D.   On: 11/29/2014 15:51   Ct Chest Wo Contrast  12/14/2014   CLINICAL DATA:  Choked on food during dinner, coughed up 2 pieces of core bread, ongoing cough, mild stridor, history hypertension, hyperlipidemia, atrial fibrillation, stroke, pulmonary embolism, dementia, chronic kidney disease  EXAM: CT CHEST WITHOUT CONTRAST  TECHNIQUE: Multidetector CT imaging of the chest was performed following the standard protocol without IV contrast. Sagittal and coronal MPR images reconstructed from axial data set.  COMPARISON:  CTA chest 11/10/2012  FINDINGS: Atherosclerotic calcifications aorta, coronary arteries and proximal great vessels.  Post TAVR.  Pacemaker leads in RIGHT atrium and RIGHT ventricle.  Visualized upper abdomen unremarkable.  Moderate-sized hiatal hernia.  Normal size mediastinal lymph nodes without definite thoracic adenopathy.  Beam hardening artifacts from pacemaker generator anterior RIGHT chest wall.  Thoracic esophagus to the air-filled with otherwise unremarkable.  No office esophageal mass or foreign body.  Respiratory motion artifacts from patient coughing degrade assessment of the lungs.  No definite pulmonary infiltrate, pleural effusion, or pneumothorax.  Trachea is markedly narrow distally in its AP dimension, suspect tracheomalacia in the setting of expiration.  No acute osseous findings.  Sclerotic focus within posterior aspect of the T5 vertebral body appears unchanged since 2014.  IMPRESSION: Extensive atherosclerotic disease.  Prior TAVR and pacemaker.  Question  tracheomalacia.  No additional acute abnormalities identified within limits imposed by respiratory motion artifacts.   Electronically Signed   By: Ulyses Southward M.D.   On: 12/14/2014 20:32    ASSESSMENT/PLAN:  Osteoarthritis S/P right total knee arthroplasty - for home health PT, OT and CNA; continue Coumadin for DVT prophylaxis; RLE WBAT; continue Norco 5/325 mg 1 -2 tabs PO Q 6 hours PRN for pain; follow-up with Dr. Charlann Boxer, orthopedic surgeon  Atrial fibrillation - rate controlled; continue amiodarone 100 mg PO daily and Coumadin  Hypertension - continue Coreg 12.5 mg 1 tab by mouth twice a day and hydralazine 10 mg 1 tab by mouth 3 times a day  Allergic rhinitis - continue Zyrtec 10 mg 1 tab by mouth every morning  Gout - recently decreased decreased colchicine from 0.6 to 0.3 mg daily and continue Uloric 40 mg 1 tab by mouth every evening  Chronic constipation - continue Colace 50 mg by mouth twice a day and Linzess 145 g 1 capsule by mouth every other day alternating with 219 g capsule   Dementia - continue Aricept 10 mg 1 tab by mouth daily at bedtime and Namenda 10 mg 1 tab by mouth twice a day  Depression - continue Cymbalta 60 mg 1 capsule by mouth daily evening  GERD - recently increased Nexium 40 mg 1 capsule by mouth BID   Hyperlipidemia - continue Pravachol 40 mg 1 tab by mouth daily evening  Neuropathy - continue Lyrica 75 mg 1 capsule by mouth twice a day  Anemia of chronic disease - hemoglobin 8.2; stable  Chronic kidney disease, stage III - creatinine 2.54; improvement from 3.37 on 8/18  Protein-calorie malnutrition, severe - albumin 2.8; continue supplementation     I have filled out patient's discharge paperwork and written prescriptions.  Patient will receive home health PT, OT and CNA.  Total discharge time: Less than 30 minutes  Discharge time involved coordination of the discharge process with Child psychotherapist, nursing staff and therapy department. Medical  justification for home health services verified.   [facility], NP [employer] [phone]

## 2014-12-27 DIAGNOSIS — N183 Chronic kidney disease, stage 3 (moderate): Secondary | ICD-10-CM | POA: Diagnosis not present

## 2014-12-27 DIAGNOSIS — M4800 Spinal stenosis, site unspecified: Secondary | ICD-10-CM | POA: Diagnosis not present

## 2014-12-27 DIAGNOSIS — G4730 Sleep apnea, unspecified: Secondary | ICD-10-CM | POA: Diagnosis not present

## 2014-12-27 DIAGNOSIS — M797 Fibromyalgia: Secondary | ICD-10-CM | POA: Diagnosis not present

## 2014-12-27 DIAGNOSIS — I480 Paroxysmal atrial fibrillation: Secondary | ICD-10-CM | POA: Diagnosis not present

## 2014-12-27 DIAGNOSIS — F0390 Unspecified dementia without behavioral disturbance: Secondary | ICD-10-CM | POA: Diagnosis not present

## 2014-12-27 DIAGNOSIS — Z471 Aftercare following joint replacement surgery: Secondary | ICD-10-CM | POA: Diagnosis not present

## 2014-12-27 DIAGNOSIS — I129 Hypertensive chronic kidney disease with stage 1 through stage 4 chronic kidney disease, or unspecified chronic kidney disease: Secondary | ICD-10-CM | POA: Diagnosis not present

## 2014-12-27 DIAGNOSIS — E039 Hypothyroidism, unspecified: Secondary | ICD-10-CM | POA: Diagnosis not present

## 2014-12-27 DIAGNOSIS — M6281 Muscle weakness (generalized): Secondary | ICD-10-CM | POA: Diagnosis not present

## 2014-12-27 DIAGNOSIS — I5043 Acute on chronic combined systolic (congestive) and diastolic (congestive) heart failure: Secondary | ICD-10-CM | POA: Diagnosis not present

## 2014-12-27 DIAGNOSIS — Z96651 Presence of right artificial knee joint: Secondary | ICD-10-CM | POA: Diagnosis not present

## 2014-12-27 DIAGNOSIS — G629 Polyneuropathy, unspecified: Secondary | ICD-10-CM | POA: Diagnosis not present

## 2014-12-28 DIAGNOSIS — M1712 Unilateral primary osteoarthritis, left knee: Secondary | ICD-10-CM | POA: Diagnosis not present

## 2014-12-29 DIAGNOSIS — Z471 Aftercare following joint replacement surgery: Secondary | ICD-10-CM | POA: Diagnosis not present

## 2014-12-29 DIAGNOSIS — N183 Chronic kidney disease, stage 3 (moderate): Secondary | ICD-10-CM | POA: Diagnosis not present

## 2014-12-29 DIAGNOSIS — G4730 Sleep apnea, unspecified: Secondary | ICD-10-CM | POA: Diagnosis not present

## 2014-12-29 DIAGNOSIS — I5043 Acute on chronic combined systolic (congestive) and diastolic (congestive) heart failure: Secondary | ICD-10-CM | POA: Diagnosis not present

## 2014-12-29 DIAGNOSIS — M4800 Spinal stenosis, site unspecified: Secondary | ICD-10-CM | POA: Diagnosis not present

## 2014-12-29 DIAGNOSIS — M6281 Muscle weakness (generalized): Secondary | ICD-10-CM | POA: Diagnosis not present

## 2014-12-29 DIAGNOSIS — I129 Hypertensive chronic kidney disease with stage 1 through stage 4 chronic kidney disease, or unspecified chronic kidney disease: Secondary | ICD-10-CM | POA: Diagnosis not present

## 2014-12-29 DIAGNOSIS — I480 Paroxysmal atrial fibrillation: Secondary | ICD-10-CM | POA: Diagnosis not present

## 2014-12-29 DIAGNOSIS — F0390 Unspecified dementia without behavioral disturbance: Secondary | ICD-10-CM | POA: Diagnosis not present

## 2014-12-29 DIAGNOSIS — Z96651 Presence of right artificial knee joint: Secondary | ICD-10-CM | POA: Diagnosis not present

## 2014-12-29 DIAGNOSIS — E039 Hypothyroidism, unspecified: Secondary | ICD-10-CM | POA: Diagnosis not present

## 2014-12-29 DIAGNOSIS — G629 Polyneuropathy, unspecified: Secondary | ICD-10-CM | POA: Diagnosis not present

## 2014-12-29 DIAGNOSIS — M797 Fibromyalgia: Secondary | ICD-10-CM | POA: Diagnosis not present

## 2015-01-02 DIAGNOSIS — N183 Chronic kidney disease, stage 3 (moderate): Secondary | ICD-10-CM | POA: Diagnosis not present

## 2015-01-02 DIAGNOSIS — I5043 Acute on chronic combined systolic (congestive) and diastolic (congestive) heart failure: Secondary | ICD-10-CM | POA: Diagnosis not present

## 2015-01-02 DIAGNOSIS — G629 Polyneuropathy, unspecified: Secondary | ICD-10-CM | POA: Diagnosis not present

## 2015-01-02 DIAGNOSIS — M4800 Spinal stenosis, site unspecified: Secondary | ICD-10-CM | POA: Diagnosis not present

## 2015-01-02 DIAGNOSIS — I480 Paroxysmal atrial fibrillation: Secondary | ICD-10-CM | POA: Diagnosis not present

## 2015-01-02 DIAGNOSIS — I129 Hypertensive chronic kidney disease with stage 1 through stage 4 chronic kidney disease, or unspecified chronic kidney disease: Secondary | ICD-10-CM | POA: Diagnosis not present

## 2015-01-02 DIAGNOSIS — Z96651 Presence of right artificial knee joint: Secondary | ICD-10-CM | POA: Diagnosis not present

## 2015-01-02 DIAGNOSIS — Z471 Aftercare following joint replacement surgery: Secondary | ICD-10-CM | POA: Diagnosis not present

## 2015-01-02 DIAGNOSIS — M6281 Muscle weakness (generalized): Secondary | ICD-10-CM | POA: Diagnosis not present

## 2015-01-02 DIAGNOSIS — G4730 Sleep apnea, unspecified: Secondary | ICD-10-CM | POA: Diagnosis not present

## 2015-01-02 DIAGNOSIS — F0390 Unspecified dementia without behavioral disturbance: Secondary | ICD-10-CM | POA: Diagnosis not present

## 2015-01-02 DIAGNOSIS — E039 Hypothyroidism, unspecified: Secondary | ICD-10-CM | POA: Diagnosis not present

## 2015-01-02 DIAGNOSIS — M797 Fibromyalgia: Secondary | ICD-10-CM | POA: Diagnosis not present

## 2015-01-02 DIAGNOSIS — I509 Heart failure, unspecified: Secondary | ICD-10-CM | POA: Diagnosis not present

## 2015-01-04 DIAGNOSIS — Z96651 Presence of right artificial knee joint: Secondary | ICD-10-CM | POA: Diagnosis not present

## 2015-01-04 DIAGNOSIS — M4800 Spinal stenosis, site unspecified: Secondary | ICD-10-CM | POA: Diagnosis not present

## 2015-01-04 DIAGNOSIS — I480 Paroxysmal atrial fibrillation: Secondary | ICD-10-CM | POA: Diagnosis not present

## 2015-01-04 DIAGNOSIS — I4891 Unspecified atrial fibrillation: Secondary | ICD-10-CM | POA: Diagnosis not present

## 2015-01-04 DIAGNOSIS — M797 Fibromyalgia: Secondary | ICD-10-CM | POA: Diagnosis not present

## 2015-01-04 DIAGNOSIS — G629 Polyneuropathy, unspecified: Secondary | ICD-10-CM | POA: Diagnosis not present

## 2015-01-04 DIAGNOSIS — I5043 Acute on chronic combined systolic (congestive) and diastolic (congestive) heart failure: Secondary | ICD-10-CM | POA: Diagnosis not present

## 2015-01-04 DIAGNOSIS — F0390 Unspecified dementia without behavioral disturbance: Secondary | ICD-10-CM | POA: Diagnosis not present

## 2015-01-04 DIAGNOSIS — Z471 Aftercare following joint replacement surgery: Secondary | ICD-10-CM | POA: Diagnosis not present

## 2015-01-04 DIAGNOSIS — E039 Hypothyroidism, unspecified: Secondary | ICD-10-CM | POA: Diagnosis not present

## 2015-01-04 DIAGNOSIS — G4730 Sleep apnea, unspecified: Secondary | ICD-10-CM | POA: Diagnosis not present

## 2015-01-04 DIAGNOSIS — Z7901 Long term (current) use of anticoagulants: Secondary | ICD-10-CM | POA: Diagnosis not present

## 2015-01-04 DIAGNOSIS — M6281 Muscle weakness (generalized): Secondary | ICD-10-CM | POA: Diagnosis not present

## 2015-01-04 DIAGNOSIS — N183 Chronic kidney disease, stage 3 (moderate): Secondary | ICD-10-CM | POA: Diagnosis not present

## 2015-01-04 DIAGNOSIS — N289 Disorder of kidney and ureter, unspecified: Secondary | ICD-10-CM | POA: Diagnosis not present

## 2015-01-04 DIAGNOSIS — I129 Hypertensive chronic kidney disease with stage 1 through stage 4 chronic kidney disease, or unspecified chronic kidney disease: Secondary | ICD-10-CM | POA: Diagnosis not present

## 2015-01-08 DIAGNOSIS — E039 Hypothyroidism, unspecified: Secondary | ICD-10-CM | POA: Diagnosis not present

## 2015-01-08 DIAGNOSIS — I480 Paroxysmal atrial fibrillation: Secondary | ICD-10-CM | POA: Diagnosis not present

## 2015-01-08 DIAGNOSIS — I129 Hypertensive chronic kidney disease with stage 1 through stage 4 chronic kidney disease, or unspecified chronic kidney disease: Secondary | ICD-10-CM | POA: Diagnosis not present

## 2015-01-08 DIAGNOSIS — G4730 Sleep apnea, unspecified: Secondary | ICD-10-CM | POA: Diagnosis not present

## 2015-01-08 DIAGNOSIS — Z471 Aftercare following joint replacement surgery: Secondary | ICD-10-CM | POA: Diagnosis not present

## 2015-01-08 DIAGNOSIS — M6281 Muscle weakness (generalized): Secondary | ICD-10-CM | POA: Diagnosis not present

## 2015-01-08 DIAGNOSIS — M797 Fibromyalgia: Secondary | ICD-10-CM | POA: Diagnosis not present

## 2015-01-08 DIAGNOSIS — G629 Polyneuropathy, unspecified: Secondary | ICD-10-CM | POA: Diagnosis not present

## 2015-01-08 DIAGNOSIS — Z96651 Presence of right artificial knee joint: Secondary | ICD-10-CM | POA: Diagnosis not present

## 2015-01-08 DIAGNOSIS — F0390 Unspecified dementia without behavioral disturbance: Secondary | ICD-10-CM | POA: Diagnosis not present

## 2015-01-08 DIAGNOSIS — I5043 Acute on chronic combined systolic (congestive) and diastolic (congestive) heart failure: Secondary | ICD-10-CM | POA: Diagnosis not present

## 2015-01-08 DIAGNOSIS — N183 Chronic kidney disease, stage 3 (moderate): Secondary | ICD-10-CM | POA: Diagnosis not present

## 2015-01-08 DIAGNOSIS — M4800 Spinal stenosis, site unspecified: Secondary | ICD-10-CM | POA: Diagnosis not present

## 2015-01-09 ENCOUNTER — Telehealth: Payer: Self-pay | Admitting: Cardiology

## 2015-01-09 ENCOUNTER — Ambulatory Visit (INDEPENDENT_AMBULATORY_CARE_PROVIDER_SITE_OTHER): Payer: Medicare Other | Admitting: *Deleted

## 2015-01-09 DIAGNOSIS — I480 Paroxysmal atrial fibrillation: Secondary | ICD-10-CM

## 2015-01-09 DIAGNOSIS — M5442 Lumbago with sciatica, left side: Secondary | ICD-10-CM | POA: Diagnosis not present

## 2015-01-09 DIAGNOSIS — M5441 Lumbago with sciatica, right side: Secondary | ICD-10-CM | POA: Diagnosis not present

## 2015-01-09 DIAGNOSIS — M5136 Other intervertebral disc degeneration, lumbar region: Secondary | ICD-10-CM | POA: Diagnosis not present

## 2015-01-09 DIAGNOSIS — G8929 Other chronic pain: Secondary | ICD-10-CM | POA: Diagnosis not present

## 2015-01-09 NOTE — Progress Notes (Signed)
Remote pacemaker transmission.   

## 2015-01-09 NOTE — Telephone Encounter (Signed)
LMOVM reminding pt to send remote transmission.   

## 2015-01-10 DIAGNOSIS — G4730 Sleep apnea, unspecified: Secondary | ICD-10-CM | POA: Diagnosis not present

## 2015-01-10 DIAGNOSIS — G629 Polyneuropathy, unspecified: Secondary | ICD-10-CM | POA: Diagnosis not present

## 2015-01-10 DIAGNOSIS — M797 Fibromyalgia: Secondary | ICD-10-CM | POA: Diagnosis not present

## 2015-01-10 DIAGNOSIS — Z471 Aftercare following joint replacement surgery: Secondary | ICD-10-CM | POA: Diagnosis not present

## 2015-01-10 DIAGNOSIS — I129 Hypertensive chronic kidney disease with stage 1 through stage 4 chronic kidney disease, or unspecified chronic kidney disease: Secondary | ICD-10-CM | POA: Diagnosis not present

## 2015-01-10 DIAGNOSIS — I5043 Acute on chronic combined systolic (congestive) and diastolic (congestive) heart failure: Secondary | ICD-10-CM | POA: Diagnosis not present

## 2015-01-10 DIAGNOSIS — E039 Hypothyroidism, unspecified: Secondary | ICD-10-CM | POA: Diagnosis not present

## 2015-01-10 DIAGNOSIS — M4800 Spinal stenosis, site unspecified: Secondary | ICD-10-CM | POA: Diagnosis not present

## 2015-01-10 DIAGNOSIS — Z96651 Presence of right artificial knee joint: Secondary | ICD-10-CM | POA: Diagnosis not present

## 2015-01-10 DIAGNOSIS — M6281 Muscle weakness (generalized): Secondary | ICD-10-CM | POA: Diagnosis not present

## 2015-01-10 DIAGNOSIS — I480 Paroxysmal atrial fibrillation: Secondary | ICD-10-CM | POA: Diagnosis not present

## 2015-01-10 DIAGNOSIS — N183 Chronic kidney disease, stage 3 (moderate): Secondary | ICD-10-CM | POA: Diagnosis not present

## 2015-01-10 DIAGNOSIS — F0390 Unspecified dementia without behavioral disturbance: Secondary | ICD-10-CM | POA: Diagnosis not present

## 2015-01-11 DIAGNOSIS — G629 Polyneuropathy, unspecified: Secondary | ICD-10-CM | POA: Diagnosis not present

## 2015-01-11 DIAGNOSIS — I129 Hypertensive chronic kidney disease with stage 1 through stage 4 chronic kidney disease, or unspecified chronic kidney disease: Secondary | ICD-10-CM | POA: Diagnosis not present

## 2015-01-11 DIAGNOSIS — I5043 Acute on chronic combined systolic (congestive) and diastolic (congestive) heart failure: Secondary | ICD-10-CM | POA: Diagnosis not present

## 2015-01-11 DIAGNOSIS — M4800 Spinal stenosis, site unspecified: Secondary | ICD-10-CM | POA: Diagnosis not present

## 2015-01-11 DIAGNOSIS — I480 Paroxysmal atrial fibrillation: Secondary | ICD-10-CM | POA: Diagnosis not present

## 2015-01-11 DIAGNOSIS — M797 Fibromyalgia: Secondary | ICD-10-CM | POA: Diagnosis not present

## 2015-01-11 DIAGNOSIS — Z96651 Presence of right artificial knee joint: Secondary | ICD-10-CM | POA: Diagnosis not present

## 2015-01-11 DIAGNOSIS — E039 Hypothyroidism, unspecified: Secondary | ICD-10-CM | POA: Diagnosis not present

## 2015-01-11 DIAGNOSIS — Z471 Aftercare following joint replacement surgery: Secondary | ICD-10-CM | POA: Diagnosis not present

## 2015-01-11 DIAGNOSIS — F0390 Unspecified dementia without behavioral disturbance: Secondary | ICD-10-CM | POA: Diagnosis not present

## 2015-01-11 DIAGNOSIS — G4730 Sleep apnea, unspecified: Secondary | ICD-10-CM | POA: Diagnosis not present

## 2015-01-11 DIAGNOSIS — M6281 Muscle weakness (generalized): Secondary | ICD-10-CM | POA: Diagnosis not present

## 2015-01-11 DIAGNOSIS — N183 Chronic kidney disease, stage 3 (moderate): Secondary | ICD-10-CM | POA: Diagnosis not present

## 2015-01-15 DIAGNOSIS — F0390 Unspecified dementia without behavioral disturbance: Secondary | ICD-10-CM | POA: Diagnosis not present

## 2015-01-15 DIAGNOSIS — Z96651 Presence of right artificial knee joint: Secondary | ICD-10-CM | POA: Diagnosis not present

## 2015-01-15 DIAGNOSIS — Z471 Aftercare following joint replacement surgery: Secondary | ICD-10-CM | POA: Diagnosis not present

## 2015-01-15 DIAGNOSIS — N183 Chronic kidney disease, stage 3 (moderate): Secondary | ICD-10-CM | POA: Diagnosis not present

## 2015-01-15 DIAGNOSIS — I480 Paroxysmal atrial fibrillation: Secondary | ICD-10-CM | POA: Diagnosis not present

## 2015-01-15 DIAGNOSIS — I129 Hypertensive chronic kidney disease with stage 1 through stage 4 chronic kidney disease, or unspecified chronic kidney disease: Secondary | ICD-10-CM | POA: Diagnosis not present

## 2015-01-15 DIAGNOSIS — M6281 Muscle weakness (generalized): Secondary | ICD-10-CM | POA: Diagnosis not present

## 2015-01-15 DIAGNOSIS — G629 Polyneuropathy, unspecified: Secondary | ICD-10-CM | POA: Diagnosis not present

## 2015-01-15 DIAGNOSIS — M4800 Spinal stenosis, site unspecified: Secondary | ICD-10-CM | POA: Diagnosis not present

## 2015-01-15 DIAGNOSIS — I5043 Acute on chronic combined systolic (congestive) and diastolic (congestive) heart failure: Secondary | ICD-10-CM | POA: Diagnosis not present

## 2015-01-15 DIAGNOSIS — G4730 Sleep apnea, unspecified: Secondary | ICD-10-CM | POA: Diagnosis not present

## 2015-01-15 DIAGNOSIS — E039 Hypothyroidism, unspecified: Secondary | ICD-10-CM | POA: Diagnosis not present

## 2015-01-15 DIAGNOSIS — M797 Fibromyalgia: Secondary | ICD-10-CM | POA: Diagnosis not present

## 2015-01-15 LAB — CUP PACEART REMOTE DEVICE CHECK
Battery Remaining Longevity: 87 mo
Brady Statistic AP VP Percent: 14 %
Brady Statistic AP VS Percent: 76 %
Brady Statistic AS VP Percent: 0 %
Brady Statistic AS VS Percent: 10 %
Date Time Interrogation Session: 20160920164331
Lead Channel Impedance Value: 535 Ohm
Lead Channel Setting Pacing Amplitude: 2 V
Lead Channel Setting Pacing Amplitude: 2.5 V
Lead Channel Setting Pacing Pulse Width: 0.4 ms
MDC IDC MSMT BATTERY IMPEDANCE: 398 Ohm
MDC IDC MSMT BATTERY VOLTAGE: 2.78 V
MDC IDC MSMT LEADCHNL RV IMPEDANCE VALUE: 567 Ohm
MDC IDC SET LEADCHNL RV SENSING SENSITIVITY: 1.4 mV

## 2015-01-16 DIAGNOSIS — G629 Polyneuropathy, unspecified: Secondary | ICD-10-CM | POA: Diagnosis not present

## 2015-01-16 DIAGNOSIS — Z96651 Presence of right artificial knee joint: Secondary | ICD-10-CM | POA: Diagnosis not present

## 2015-01-16 DIAGNOSIS — E039 Hypothyroidism, unspecified: Secondary | ICD-10-CM | POA: Diagnosis not present

## 2015-01-16 DIAGNOSIS — I480 Paroxysmal atrial fibrillation: Secondary | ICD-10-CM | POA: Diagnosis not present

## 2015-01-16 DIAGNOSIS — I129 Hypertensive chronic kidney disease with stage 1 through stage 4 chronic kidney disease, or unspecified chronic kidney disease: Secondary | ICD-10-CM | POA: Diagnosis not present

## 2015-01-16 DIAGNOSIS — I5043 Acute on chronic combined systolic (congestive) and diastolic (congestive) heart failure: Secondary | ICD-10-CM | POA: Diagnosis not present

## 2015-01-16 DIAGNOSIS — M6281 Muscle weakness (generalized): Secondary | ICD-10-CM | POA: Diagnosis not present

## 2015-01-16 DIAGNOSIS — N183 Chronic kidney disease, stage 3 (moderate): Secondary | ICD-10-CM | POA: Diagnosis not present

## 2015-01-16 DIAGNOSIS — G4730 Sleep apnea, unspecified: Secondary | ICD-10-CM | POA: Diagnosis not present

## 2015-01-16 DIAGNOSIS — F0390 Unspecified dementia without behavioral disturbance: Secondary | ICD-10-CM | POA: Diagnosis not present

## 2015-01-16 DIAGNOSIS — M4800 Spinal stenosis, site unspecified: Secondary | ICD-10-CM | POA: Diagnosis not present

## 2015-01-16 DIAGNOSIS — M797 Fibromyalgia: Secondary | ICD-10-CM | POA: Diagnosis not present

## 2015-01-16 DIAGNOSIS — Z471 Aftercare following joint replacement surgery: Secondary | ICD-10-CM | POA: Diagnosis not present

## 2015-01-17 DIAGNOSIS — G4730 Sleep apnea, unspecified: Secondary | ICD-10-CM | POA: Diagnosis not present

## 2015-01-17 DIAGNOSIS — I5043 Acute on chronic combined systolic (congestive) and diastolic (congestive) heart failure: Secondary | ICD-10-CM | POA: Diagnosis not present

## 2015-01-17 DIAGNOSIS — E039 Hypothyroidism, unspecified: Secondary | ICD-10-CM | POA: Diagnosis not present

## 2015-01-17 DIAGNOSIS — F0390 Unspecified dementia without behavioral disturbance: Secondary | ICD-10-CM | POA: Diagnosis not present

## 2015-01-17 DIAGNOSIS — N183 Chronic kidney disease, stage 3 (moderate): Secondary | ICD-10-CM | POA: Diagnosis not present

## 2015-01-17 DIAGNOSIS — G629 Polyneuropathy, unspecified: Secondary | ICD-10-CM | POA: Diagnosis not present

## 2015-01-17 DIAGNOSIS — Z471 Aftercare following joint replacement surgery: Secondary | ICD-10-CM | POA: Diagnosis not present

## 2015-01-17 DIAGNOSIS — I129 Hypertensive chronic kidney disease with stage 1 through stage 4 chronic kidney disease, or unspecified chronic kidney disease: Secondary | ICD-10-CM | POA: Diagnosis not present

## 2015-01-17 DIAGNOSIS — M797 Fibromyalgia: Secondary | ICD-10-CM | POA: Diagnosis not present

## 2015-01-17 DIAGNOSIS — M6281 Muscle weakness (generalized): Secondary | ICD-10-CM | POA: Diagnosis not present

## 2015-01-17 DIAGNOSIS — Z96651 Presence of right artificial knee joint: Secondary | ICD-10-CM | POA: Diagnosis not present

## 2015-01-17 DIAGNOSIS — I480 Paroxysmal atrial fibrillation: Secondary | ICD-10-CM | POA: Diagnosis not present

## 2015-01-17 DIAGNOSIS — M4800 Spinal stenosis, site unspecified: Secondary | ICD-10-CM | POA: Diagnosis not present

## 2015-01-18 DIAGNOSIS — Z96651 Presence of right artificial knee joint: Secondary | ICD-10-CM | POA: Diagnosis not present

## 2015-01-18 DIAGNOSIS — I5043 Acute on chronic combined systolic (congestive) and diastolic (congestive) heart failure: Secondary | ICD-10-CM | POA: Diagnosis not present

## 2015-01-18 DIAGNOSIS — M797 Fibromyalgia: Secondary | ICD-10-CM | POA: Diagnosis not present

## 2015-01-18 DIAGNOSIS — M4800 Spinal stenosis, site unspecified: Secondary | ICD-10-CM | POA: Diagnosis not present

## 2015-01-18 DIAGNOSIS — G4730 Sleep apnea, unspecified: Secondary | ICD-10-CM | POA: Diagnosis not present

## 2015-01-18 DIAGNOSIS — G629 Polyneuropathy, unspecified: Secondary | ICD-10-CM | POA: Diagnosis not present

## 2015-01-18 DIAGNOSIS — I129 Hypertensive chronic kidney disease with stage 1 through stage 4 chronic kidney disease, or unspecified chronic kidney disease: Secondary | ICD-10-CM | POA: Diagnosis not present

## 2015-01-18 DIAGNOSIS — M6281 Muscle weakness (generalized): Secondary | ICD-10-CM | POA: Diagnosis not present

## 2015-01-18 DIAGNOSIS — Z471 Aftercare following joint replacement surgery: Secondary | ICD-10-CM | POA: Diagnosis not present

## 2015-01-18 DIAGNOSIS — I480 Paroxysmal atrial fibrillation: Secondary | ICD-10-CM | POA: Diagnosis not present

## 2015-01-18 DIAGNOSIS — E039 Hypothyroidism, unspecified: Secondary | ICD-10-CM | POA: Diagnosis not present

## 2015-01-18 DIAGNOSIS — N183 Chronic kidney disease, stage 3 (moderate): Secondary | ICD-10-CM | POA: Diagnosis not present

## 2015-01-18 DIAGNOSIS — F0390 Unspecified dementia without behavioral disturbance: Secondary | ICD-10-CM | POA: Diagnosis not present

## 2015-01-22 DIAGNOSIS — F0390 Unspecified dementia without behavioral disturbance: Secondary | ICD-10-CM | POA: Diagnosis not present

## 2015-01-22 DIAGNOSIS — Z96651 Presence of right artificial knee joint: Secondary | ICD-10-CM | POA: Diagnosis not present

## 2015-01-22 DIAGNOSIS — E039 Hypothyroidism, unspecified: Secondary | ICD-10-CM | POA: Diagnosis not present

## 2015-01-22 DIAGNOSIS — I129 Hypertensive chronic kidney disease with stage 1 through stage 4 chronic kidney disease, or unspecified chronic kidney disease: Secondary | ICD-10-CM | POA: Diagnosis not present

## 2015-01-22 DIAGNOSIS — G629 Polyneuropathy, unspecified: Secondary | ICD-10-CM | POA: Diagnosis not present

## 2015-01-22 DIAGNOSIS — M797 Fibromyalgia: Secondary | ICD-10-CM | POA: Diagnosis not present

## 2015-01-22 DIAGNOSIS — N183 Chronic kidney disease, stage 3 (moderate): Secondary | ICD-10-CM | POA: Diagnosis not present

## 2015-01-22 DIAGNOSIS — Z471 Aftercare following joint replacement surgery: Secondary | ICD-10-CM | POA: Diagnosis not present

## 2015-01-22 DIAGNOSIS — I5043 Acute on chronic combined systolic (congestive) and diastolic (congestive) heart failure: Secondary | ICD-10-CM | POA: Diagnosis not present

## 2015-01-22 DIAGNOSIS — M4800 Spinal stenosis, site unspecified: Secondary | ICD-10-CM | POA: Diagnosis not present

## 2015-01-22 DIAGNOSIS — G4730 Sleep apnea, unspecified: Secondary | ICD-10-CM | POA: Diagnosis not present

## 2015-01-22 DIAGNOSIS — I480 Paroxysmal atrial fibrillation: Secondary | ICD-10-CM | POA: Diagnosis not present

## 2015-01-22 DIAGNOSIS — M6281 Muscle weakness (generalized): Secondary | ICD-10-CM | POA: Diagnosis not present

## 2015-01-23 DIAGNOSIS — N189 Chronic kidney disease, unspecified: Secondary | ICD-10-CM | POA: Diagnosis not present

## 2015-01-23 DIAGNOSIS — Z23 Encounter for immunization: Secondary | ICD-10-CM | POA: Diagnosis not present

## 2015-01-23 DIAGNOSIS — Z7901 Long term (current) use of anticoagulants: Secondary | ICD-10-CM | POA: Diagnosis not present

## 2015-01-23 DIAGNOSIS — N184 Chronic kidney disease, stage 4 (severe): Secondary | ICD-10-CM | POA: Diagnosis not present

## 2015-01-23 DIAGNOSIS — I4891 Unspecified atrial fibrillation: Secondary | ICD-10-CM | POA: Diagnosis not present

## 2015-01-24 ENCOUNTER — Ambulatory Visit: Payer: Medicare Other | Attending: Orthopedic Surgery

## 2015-01-24 DIAGNOSIS — M25561 Pain in right knee: Secondary | ICD-10-CM | POA: Diagnosis not present

## 2015-01-24 DIAGNOSIS — R262 Difficulty in walking, not elsewhere classified: Secondary | ICD-10-CM | POA: Diagnosis not present

## 2015-01-24 DIAGNOSIS — R6889 Other general symptoms and signs: Secondary | ICD-10-CM | POA: Insufficient documentation

## 2015-01-24 DIAGNOSIS — R29898 Other symptoms and signs involving the musculoskeletal system: Secondary | ICD-10-CM | POA: Diagnosis not present

## 2015-01-24 DIAGNOSIS — R609 Edema, unspecified: Secondary | ICD-10-CM | POA: Insufficient documentation

## 2015-01-24 DIAGNOSIS — M25661 Stiffness of right knee, not elsewhere classified: Secondary | ICD-10-CM | POA: Diagnosis not present

## 2015-01-24 NOTE — Therapy (Signed)
[facility] [address] [place], [place], [address] Phone: [phone]   Fax:  [phone]  Physical Therapy Evaluation  Patient Details  Name: [name] MRN: [id number] Date of Birth: [birth year] Referring Provider:  Durene Romans, MD  Encounter Date: 01/24/2015      PT End of Session - 01/24/15 1120    Visit Number 1   Number of Visits 24   Date for PT Re-Evaluation 04/18/15   Authorization Type Medicare  KX at 15 viait   Authorization - Visit Number 1   Authorization - Number of Visits 24   PT Start Time 1020   PT Stop Time 1110   PT Time Calculation (min) 50 min   Activity Tolerance Patient tolerated treatment well   Behavior During Therapy [facility] for tasks assessed/performed      Past Medical History  Diagnosis Date  . Syncope   . Symptomatic bradycardia   . Chronic lower back pain   . Nonischemic cardiomyopathy   . HTN (hypertension)   . HLD (hyperlipidemia)   . Atrial fibrillation     tachy-brady syndrome with <1% recurrent PAF since pacemaker placement  . H/O: stroke   . Pulmonary embolism     HISTORY OF, the pt. had a recurrent bilateral pulmonary emboli in 2005, on warfarin therapy and at which time she under went implantation of IVC filter  . Anemia   . Dementia   . Gastroesophageal reflux disease   . Aortic stenosis 10/13/2012    Low EF, low gradient with severe aortic stenosis confirmed by dobutamine stress echocardiogram s/p TAVR 12/2012  . PONV (postoperative nausea and vomiting)   . Osteoarthritis   . Neuropathy   . Spinal stenosis   . Symptomatic bradycardia 2012    s/p Medtronic PPM  . Chronic combined systolic and diastolic CHF (congestive heart failure)   . Fibromyalgia   . Presence of permanent cardiac pacemaker   . Sleep apnea     uses oxygen at night and PRN- not used since > 6 months   . Shortness of breath dyspnea     with exertion   . Pneumonia     hx of x 3   . Acute on  chronic renal failure     sees Dr Allena Katz   . CKD (chronic kidney disease)   . Headache     hx of migraines   . On home oxygen therapy     patient uses at nite- 2L- has not used in > 6 months per patient    Past Surgical History  Procedure Laterality Date  . Pacemaker insertion      Medtronic  . Transcatheter aortic valve replacement, transfemoral  12/21/2012    a. 29mm Edwards Sapien XT transcatheter heart valve placed via open left transfemoral approach b. Intra-op TEE: well-seated bioprosthetic aortic valve with mean gradient 2 mmHg, trivial AI, mild MR, EF 30-35%  . Transcatheter aortic valve replacement, transfemoral N/A 12/21/2012    Procedure: TRANSCATHETER AORTIC VALVE REPLACEMENT, TRANSFEMORAL;  Surgeon: Tonny Bollman, MD;  Location: [facility] OR;  Service: Open Heart Surgery;  Laterality: N/A;  . Intraoperative transesophageal echocardiogram N/A 12/21/2012    Procedure: INTRAOPERATIVE TRANSESOPHAGEAL ECHOCARDIOGRAM;  Surgeon: Tonny Bollman, MD;  Location: [facility] OR;  Service: Open Heart Surgery;  Laterality: N/A;  . Central venous catheter insertion Left 12/21/2012    Procedure: INSERTION CENTRAL LINE ADULT;  Surgeon: Tonny Bollman, MD;  Location: [facility] OR;  Service: Open Heart Surgery;  Laterality: Left;  .  Cataract extraction    . Bunionectomy    . Appendectomy    . Abdominal hysterectomy    . Thyroidectomy, partial    . Back surgery    . Knee surgery Left   . Nose surgery      X 2  . Left and right heart catheterization with coronary angiogram N/A 10/18/2012    Procedure: LEFT AND RIGHT HEART CATHETERIZATION WITH CORONARY ANGIOGRAM;  Surgeon: Tonny Bollman, MD;  Location: [facility] CATH LAB;  Service: Cardiovascular;  Laterality: N/A;  . Nasal septum surgery    . Total knee arthroplasty Right 12/04/2014    Procedure: RIGHT TOTAL  KNEE ARTHROPLASTY;  Surgeon: Durene Romans, MD;  Location: [facility] ORS;  Service: Orthopedics;  Laterality: Right;    There were no vitals filed for this visit.  Visit  Diagnosis:  Stiffness of knee joint, right - Plan: PT plan of care cert/re-cert  Weakness of right leg - Plan: PT plan of care cert/re-cert  Edema, unspecified type - Plan: PT plan of care cert/re-cert  Difficulty walking - Plan: PT plan of care cert/re-cert  Activity intolerance - Plan: PT plan of care cert/re-cert  Right knee pain - Plan: PT plan of care cert/re-cert      Subjective Assessment - 01/24/15 1027    Subjective Pt post RT knee replacement. 01/04/15. She was in hospital 3 days, then she was in assisted living for rehab 18 days, then returned home  with HHPT 6 visits last seen 01/15/15. I was doing well until I twisted my knee  last week and pain increased.    Pertinent History Prior to surgery she was using cane since 1980. Lt knee surgery. OA Lt knee.    Limitations Walking;House hold activities;Lifting  transitional movements   How long can you sit comfortably? 60 min   How long can you stand comfortably? < 5 min   How long can you walk comfortably? around the house, 50 feet total   Patient Stated Goals Wants to be able to walk and get up and down with less pain Wants it to be better.   Currently in Pain? Yes   Pain Score 6    Pain Location Knee   Pain Orientation Right   Pain Descriptors / Indicators --  pulls in front and back   Pain Type Acute pain   Pain Onset 1 to 4 weeks ago   Pain Frequency Constant   Aggravating Factors  walking and moving ,transitional movments   Pain Relieving Factors Cold, medications   Multiple Pain Sites No            [facility] PT Assessment - 01/24/15 1037    Assessment   Medical Diagnosis RT TKA   Onset Date/Surgical Date 01/04/15   Prior Therapy Assited living and HHPT   Precautions   Precautions None   Restrictions   Weight Bearing Restrictions No   Balance Screen   Has the patient fallen in the past 6 months Yes   How many times? 1  Twisted knee and fell but not to floor   Home Environment   Living Environment Private  residence   Living Arrangements Children   Type of Home Other(Comment)  condo   Home Access Stairs to enter   Entrance Stairs-Number of Steps 1   Entrance Stairs-Rails None   Home Layout One level   Prior Function   Level of Independence Requires assistive device for independence;Needs assistance with homemaking   Cognition   Overall Cognitive Status  Within Functional Limits for tasks assessed   Observation/Other Assessments-Edema    Edema Circumferential   Circumferential Edema   Circumferential - Right 46 cm  at mid patella   Circumferential - Left  42.5 cm   Posture/Postural Control   Posture Comments stands with back flexed and she report pain and difficulty standing straight.    ROM / Strength   AROM / PROM / Strength AROM;Strength;PROM   AROM   Overall AROM Comments Active bilateral shoulder flexion 100 degrees and painful   AROM Assessment Site Knee   Right/Left Knee Right;Left   Right Knee Extension 20   Right Knee Flexion 102   Left Knee Extension 24   Left Knee Flexion 103   PROM   PROM Assessment Site Knee   Right/Left Knee Right   Right Knee Extension 12   Right Knee Flexion 122   Strength   Strength Assessment Site Knee   Right/Left Knee Right;Left   Right Knee Flexion 4+/5   Right Knee Extension 3/5  pain limited   Left Knee Flexion 5/5   Left Knee Extension --  5-/5   Transfers   Comments SLow with moderate to max effort to rise from chair.    Ambulation/Gait   Ambulation Distance (Feet) 50 Feet   Assistive device Rolling walker   Gait Pattern Step-through pattern  flexed in trunk/hip   Ambulation Surface Level;Indoor   Gait velocity .9 feet /sec                   [facility] Adult PT Treatment/Exercise - 01/24/15 1037    Manual Therapy   Manual Therapy Soft tissue mobilization   Soft tissue mobilization Patella mobs and STW toRT quad and around knee .                 PT Education - 01/24/15 1120    Education provided Yes    Education Details POC   Person(s) Educated Patient   Methods Explanation   Comprehension Verbalized understanding          PT Short Term Goals - 01/24/15 1126    PT SHORT TERM GOAL #1   Title She will improve active extension to 10 degrees and flexin to 125 degrees   Time 4   Period Weeks   Status New   PT SHORT TERM GOAL #2   Title She will be independent wtih initial HEP   Time 4   Period Weeks   Status New   PT SHORT TERM GOAL #3   Title She will improve edema  decrease by 1 cm to improve motion/decr pain   Time 4   Period Weeks   Status New   PT SHORT TERM GOAL #4   Title She will report LT knee pain decreased 50% with getting inand out of chair   Time 4   Period Weeks   Status New   PT SHORT TERM GOAL #5   Title She will report RT knee pain decreased 50% or more with walking   Time 4   Period Weeks   Status New           PT Long Term Goals - 01/24/15 1129    PT LONG TERM GOAL #1   Title She will be independent with all HEP issued as of last visit   Time 12   Period Weeks   Status New   PT LONG TERM GOAL #2   Title She will have 1-3/10 max LT  knee pain with all activity   Time 12   Period Weeks   Status New   PT LONG TERM GOAL #3   Title she will report return to normal home tasks as prior to surgery   Time 12   Period Weeks   Status New   PT LONG TERM GOAL #4   Title She will report accessing community as prior to RT TKA surgery   Time 12   Period Weeks   Status New   PT LONG TERM GOAL #5   Title She will walk with SPC in and out of home   Time 12   Period Weeks   Status New   Additional Long Term Goals   Additional Long Term Goals Yes   PT LONG TERM GOAL #6   Title Edema will be improved 2 cm RT knee   Time 12   Period Weeks   Status New               Plan - 02/11/2015 1121    Clinical Impression Statement Ms Salzillo is post RT TKA due to DJD. She presents with swelling, weakness ,pain , stiffness of RT knee limiting  independence with ambulation and transitional movements. She should improve but will probably be limited due to need for TKA on LT knee and significant OA in shoulders (we will also give some basic exercises for shoulder range and strength)and back    Pt will benefit from skilled therapeutic intervention in order to improve on the following deficits Decreased range of motion;Difficulty walking;Pain;Decreased activity tolerance;Decreased mobility;Decreased strength;Increased edema   Clinical Impairments Affecting Rehab Potential LT knee OA. Chronic limited motion/pain  in shoulders and  back pain/stiffness   PT Frequency 2x / week   PT Duration 12 weeks   PT Treatment/Interventions ADLs/Self Care Home Management;Cryotherapy;Electrical Stimulation;Moist Heat;Therapeutic exercise;Manual techniques;Functional mobility training;Patient/family education;Gait training;Balance training;Passive range of motion;Taping;Vasopneumatic Device   PT Next Visit Plan Manual for pain and swelling, tape, cold, gentle ROM   Consulted and Agree with Plan of Care Patient          G-Codes - 11-Feb-2015 1137    Functional Assessment Tool Used FOTO 68%   Functional Limitation Mobility: Walking and moving around   Mobility: Walking and Moving Around Current Status [phone]) At least 60 percent but less than 80 percent impaired, limited or restricted   Mobility: Walking and Moving Around Goal Status [phone]) At least 40 percent but less than 60 percent impaired, limited or restricted       Problem List Patient Active Problem List   Diagnosis Date Noted  . Aspiration pneumonia (HCC) 12/14/2014  . Anemia 12/14/2014  . Aspiration into airway 12/14/2014  . Swallowing dysfunction 12/14/2014  . S/P right TKA 12/04/2014  . S/P knee replacement 12/04/2014  . Acute blood loss anemia 12/28/2012  . Thrombocytopenia (HCC) 12/28/2012  . Toe fracture 12/28/2012  . CKD (chronic kidney disease), stage III 12/28/2012  . Low back  pain 12/28/2012  . Wound dehiscence, surgical 12/28/2012  . Atelectasis 12/28/2012  . S/P TAVR (transcatheter aortic valve replacement) 12/21/2012  . Severe aortic stenosis 12/06/2012  . Aortic stenosis 10/13/2012  . Acute on chronic systolic and diastolic heart failure, NYHA class 3 (HCC) 10/13/2012  . Pacemaker 10/15/2010  . Paroxysmal atrial fibrillation (HCC) 10/15/2010  . Dyslipidemia 10/15/2010  . Hypertension 10/15/2010  . Chronic diastolic heart failure (HCC) 10/15/2010    Caprice Red PT 2015/02/11, 11:39 AM  [facility]  [place], [place], [address] Phone: [phone]   Fax:  [phone]

## 2015-01-25 ENCOUNTER — Ambulatory Visit: Payer: Medicare Other | Admitting: Physical Therapy

## 2015-01-25 DIAGNOSIS — R609 Edema, unspecified: Secondary | ICD-10-CM

## 2015-01-25 DIAGNOSIS — M25661 Stiffness of right knee, not elsewhere classified: Secondary | ICD-10-CM | POA: Diagnosis not present

## 2015-01-25 DIAGNOSIS — R29898 Other symptoms and signs involving the musculoskeletal system: Secondary | ICD-10-CM | POA: Diagnosis not present

## 2015-01-25 DIAGNOSIS — M25561 Pain in right knee: Secondary | ICD-10-CM | POA: Diagnosis not present

## 2015-01-25 DIAGNOSIS — R6889 Other general symptoms and signs: Secondary | ICD-10-CM

## 2015-01-25 DIAGNOSIS — R262 Difficulty in walking, not elsewhere classified: Secondary | ICD-10-CM

## 2015-01-25 NOTE — Therapy (Signed)
[facility] [address] [place], [place], [address] Phone: [phone]   Fax:  [phone]  Physical Therapy Treatment  Patient Details  Name: [name] MRN: [id number] Date of Birth: 1932-10-26 Referring Provider:  Pearson Grippe, MD  Encounter Date: 01/25/2015      PT End of Session - 01/25/15 1736    Visit Number 2   Number of Visits 24   Date for PT Re-Evaluation 04/18/15   PT Start Time 1552   PT Stop Time 1655   PT Time Calculation (min) 63 min   Activity Tolerance Patient tolerated treatment well;Patient limited by pain   Behavior During Therapy [facility] for tasks assessed/performed      Past Medical History  Diagnosis Date  . Syncope   . Symptomatic bradycardia   . Chronic lower back pain   . Nonischemic cardiomyopathy   . HTN (hypertension)   . HLD (hyperlipidemia)   . Atrial fibrillation     tachy-brady syndrome with <1% recurrent PAF since pacemaker placement  . H/O: stroke   . Pulmonary embolism     HISTORY OF, the pt. had a recurrent bilateral pulmonary emboli in 2005, on warfarin therapy and at which time she under went implantation of IVC filter  . Anemia   . Dementia   . Gastroesophageal reflux disease   . Aortic stenosis 10/13/2012    Low EF, low gradient with severe aortic stenosis confirmed by dobutamine stress echocardiogram s/p TAVR 12/2012  . PONV (postoperative nausea and vomiting)   . Osteoarthritis   . Neuropathy   . Spinal stenosis   . Symptomatic bradycardia 2012    s/p Medtronic PPM  . Chronic combined systolic and diastolic CHF (congestive heart failure)   . Fibromyalgia   . Presence of permanent cardiac pacemaker   . Sleep apnea     uses oxygen at night and PRN- not used since > 6 months   . Shortness of breath dyspnea     with exertion   . Pneumonia     hx of x 3   . Acute on chronic renal failure     sees Dr Allena Katz   . CKD (chronic kidney disease)   . Headache     hx of  migraines   . On home oxygen therapy     patient uses at nite- 2L- has not used in > 6 months per patient    Past Surgical History  Procedure Laterality Date  . Pacemaker insertion      Medtronic  . Transcatheter aortic valve replacement, transfemoral  12/21/2012    a. 29mm Edwards Sapien XT transcatheter heart valve placed via open left transfemoral approach b. Intra-op TEE: well-seated bioprosthetic aortic valve with mean gradient 2 mmHg, trivial AI, mild MR, EF 30-35%  . Transcatheter aortic valve replacement, transfemoral N/A 12/21/2012    Procedure: TRANSCATHETER AORTIC VALVE REPLACEMENT, TRANSFEMORAL;  Surgeon: Tonny Bollman, MD;  Location: [facility] OR;  Service: Open Heart Surgery;  Laterality: N/A;  . Intraoperative transesophageal echocardiogram N/A 12/21/2012    Procedure: INTRAOPERATIVE TRANSESOPHAGEAL ECHOCARDIOGRAM;  Surgeon: Tonny Bollman, MD;  Location: [facility] OR;  Service: Open Heart Surgery;  Laterality: N/A;  . Central venous catheter insertion Left 12/21/2012    Procedure: INSERTION CENTRAL LINE ADULT;  Surgeon: Tonny Bollman, MD;  Location: [facility] OR;  Service: Open Heart Surgery;  Laterality: Left;  . Cataract extraction    . Bunionectomy    . Appendectomy    . Abdominal hysterectomy    .  Thyroidectomy, partial    . Back surgery    . Knee surgery Left   . Nose surgery      X 2  . Left and right heart catheterization with coronary angiogram N/A 10/18/2012    Procedure: LEFT AND RIGHT HEART CATHETERIZATION WITH CORONARY ANGIOGRAM;  Surgeon: Tonny Bollman, MD;  Location: [facility] CATH LAB;  Service: Cardiovascular;  Laterality: N/A;  . Nasal septum surgery    . Total knee arthroplasty Right 12/04/2014    Procedure: RIGHT TOTAL  KNEE ARTHROPLASTY;  Surgeon: Durene Romans, MD;  Location: [facility] ORS;  Service: Orthopedics;  Laterality: Right;    There were no vitals filed for this visit.  Visit Diagnosis:  Stiffness of knee joint, right  Weakness of right leg  Edema, unspecified  type  Difficulty walking  Activity intolerance  Right knee pain      Subjective Assessment - 01/25/15 1559    Subjective Pain unchanged since she twisted her knee.  It was doing better 6/10.  Constant sharp at times tries medication, massage, ice,rest elevation   Currently in Pain? Yes   Pain Score 6    Pain Location Knee   Pain Orientation Right   Pain Descriptors / Indicators Aching;Sore;Shooting   Pain Radiating Towards shin   Pain Onset In the past 7 days   Pain Frequency Constant   Aggravating Factors  twisting it last week to prevent a fall   Pain Relieving Factors cold, elevation, medication, massage   Multiple Pain Sites No            [facility] PT Assessment - 01/24/15 1037    Assessment   Medical Diagnosis RT TKA   Onset Date/Surgical Date 01/04/15   Prior Therapy Assited living and HHPT   Precautions   Precautions None   Restrictions   Weight Bearing Restrictions No   Balance Screen   Has the patient fallen in the past 6 months Yes   How many times? 1  Twisted knee and fell but not to floor   Home Environment   Living Environment Private residence   Living Arrangements Children   Type of Home Other(Comment)  condo   Home Access Stairs to enter   Entrance Stairs-Number of Steps 1   Entrance Stairs-Rails None   Home Layout One level   Prior Function   Level of Independence Requires assistive device for independence;Needs assistance with homemaking   Cognition   Overall Cognitive Status Within Functional Limits for tasks assessed   Observation/Other Assessments-Edema    Edema Circumferential   Circumferential Edema   Circumferential - Right 46 cm  at mid patella   Circumferential - Left  42.5 cm   Posture/Postural Control   Posture Comments stands with back flexed and she report pain and difficulty standing straight.    ROM / Strength   AROM / PROM / Strength AROM;Strength;PROM   AROM   Overall AROM Comments Active bilateral shoulder flexion 100  degrees and painful   AROM Assessment Site Knee   Right/Left Knee Right;Left   Right Knee Extension 20   Right Knee Flexion 102   Left Knee Extension 24   Left Knee Flexion 103   PROM   PROM Assessment Site Knee   Right/Left Knee Right   Right Knee Extension 12   Right Knee Flexion 122   Strength   Strength Assessment Site Knee   Right/Left Knee Right;Left   Right Knee Flexion 4+/5   Right Knee Extension 3/5  pain limited  Left Knee Flexion 5/5   Left Knee Extension --  5-/5   Transfers   Comments SLow with moderate to max effort to rise from chair.    Ambulation/Gait   Ambulation Distance (Feet) 50 Feet   Assistive device Rolling walker   Gait Pattern Step-through pattern  flexed in trunk/hip   Ambulation Surface Level;Indoor   Gait velocity .9 feet /sec                     [facility] Adult PT Treatment/Exercise - 01/25/15 1557    Knee/Hip Exercises: Supine   Heel Slides 10 reps   Modalities   Modalities Cryotherapy   Cryotherapy   Cryotherapy Location Knee   Electrical Stimulation   Electrical Stimulation Location knee   Electrical Stimulation Action high vold   Electrical Stimulation Parameters to tolerance   Electrical Stimulation Goals Pain   Manual Therapy   Manual Therapy Soft tissue mobilization   Manual therapy comments thigh softer, knee stiff    Soft tissue mobilization Patella mobs and STW toRT quad and around knee .   retrograde manual and with tool for edema followed by taping                PT Education - 01/25/15 1736    Education provided Yes   Education Details edema control   Person(s) Educated Patient   Methods Explanation;Demonstration;Verbal cues   Comprehension Verbalized understanding          PT Short Term Goals - 01/24/15 1126    PT SHORT TERM GOAL #1   Title She will improve active extension to 10 degrees and flexin to 125 degrees   Time 4   Period Weeks   Status New   PT SHORT TERM GOAL #2   Title She  will be independent wtih initial HEP   Time 4   Period Weeks   Status New   PT SHORT TERM GOAL #3   Title She will improve edema  decrease by 1 cm to improve motion/decr pain   Time 4   Period Weeks   Status New   PT SHORT TERM GOAL #4   Title She will report LT knee pain decreased 50% with getting inand out of chair   Time 4   Period Weeks   Status New   PT SHORT TERM GOAL #5   Title She will report RT knee pain decreased 50% or more with walking   Time 4   Period Weeks   Status New           PT Long Term Goals - 01/24/15 1129    PT LONG TERM GOAL #1   Title She will be independent with all HEP issued as of last visit   Time 12   Period Weeks   Status New   PT LONG TERM GOAL #2   Title She will have 1-3/10 max LT knee pain with all activity   Time 12   Period Weeks   Status New   PT LONG TERM GOAL #3   Title she will report return to normal home tasks as prior to surgery   Time 12   Period Weeks   Status New   PT LONG TERM GOAL #4   Title She will report accessing community as prior to RT TKA surgery   Time 12   Period Weeks   Status New   PT LONG TERM GOAL #5   Title She will walk with [facility]  in and out of home   Time 12   Period Weeks   Status New   Additional Long Term Goals   Additional Long Term Goals Yes   PT LONG TERM GOAL #6   Title Edema will be improved 2 cm RT knee   Time 12   Period Weeks   Status New               Plan - 01/25/15 1737    Clinical Impression Statement focus on pain control and swelling.  Patient's son said she had less limping today.  pain unchanged in knee and improved in distal thige and posterior knee.   PT Next Visit Plan Manual for pain and swelling, tape, cold, gentle ROM  continue.    Consulted and Agree with Plan of Care Patient          G-Codes - 2015-01-26 1137    Functional Assessment Tool Used FOTO 68%   Functional Limitation Mobility: Walking and moving around   Mobility: Walking and Moving Around  Current Status [phone]) At least 60 percent but less than 80 percent impaired, limited or restricted   Mobility: Walking and Moving Around Goal Status [phone]) At least 40 percent but less than 60 percent impaired, limited or restricted      Problem List Patient Active Problem List   Diagnosis Date Noted  . Aspiration pneumonia (HCC) 12/14/2014  . Anemia 12/14/2014  . Aspiration into airway 12/14/2014  . Swallowing dysfunction 12/14/2014  . S/P right TKA 12/04/2014  . S/P knee replacement 12/04/2014  . Acute blood loss anemia 12/28/2012  . Thrombocytopenia (HCC) 12/28/2012  . Toe fracture 12/28/2012  . CKD (chronic kidney disease), stage III 12/28/2012  . Low back pain 12/28/2012  . Wound dehiscence, surgical 12/28/2012  . Atelectasis 12/28/2012  . S/P TAVR (transcatheter aortic valve replacement) 12/21/2012  . Severe aortic stenosis 12/06/2012  . Aortic stenosis 10/13/2012  . Acute on chronic systolic and diastolic heart failure, NYHA class 3 (HCC) 10/13/2012  . Pacemaker 10/15/2010  . Paroxysmal atrial fibrillation (HCC) 10/15/2010  . Dyslipidemia 10/15/2010  . Hypertension 10/15/2010  . Chronic diastolic heart failure (HCC) 10/15/2010    Mateya Torti 01/25/2015, 5:40 PM  [facility] [address] [place], [place], [address] Phone: [phone]   Fax:  [phone]     Liz Beach, PTA 01/25/2015 5:40 PM Phone: [phone] Fax: [phone]

## 2015-01-25 NOTE — Patient Instructions (Signed)
Call MD. if pain does not improve.

## 2015-01-30 ENCOUNTER — Encounter: Payer: Self-pay | Admitting: Cardiology

## 2015-01-31 ENCOUNTER — Ambulatory Visit: Payer: Medicare Other | Admitting: Physical Therapy

## 2015-01-31 DIAGNOSIS — R29898 Other symptoms and signs involving the musculoskeletal system: Secondary | ICD-10-CM | POA: Diagnosis not present

## 2015-01-31 DIAGNOSIS — R609 Edema, unspecified: Secondary | ICD-10-CM | POA: Diagnosis not present

## 2015-01-31 DIAGNOSIS — R262 Difficulty in walking, not elsewhere classified: Secondary | ICD-10-CM | POA: Diagnosis not present

## 2015-01-31 DIAGNOSIS — M25561 Pain in right knee: Secondary | ICD-10-CM

## 2015-01-31 DIAGNOSIS — R6889 Other general symptoms and signs: Secondary | ICD-10-CM | POA: Diagnosis not present

## 2015-01-31 DIAGNOSIS — M25661 Stiffness of right knee, not elsewhere classified: Secondary | ICD-10-CM | POA: Diagnosis not present

## 2015-01-31 NOTE — Therapy (Signed)
[facility] [address] [place], [place], [address] Phone: [phone]   Fax:  [phone]  Physical Therapy Treatment  Patient Details  Name: [name] MRN: [id number] Date of Birth: 11-Dec-1932 Referring Provider:  Pearson Grippe, MD  Encounter Date: 01/31/2015      PT End of Session - 01/31/15 1443    Visit Number 3   Number of Visits 24   Date for PT Re-Evaluation 04/18/15   PT Start Time 1424   PT Stop Time 1516   PT Time Calculation (min) 52 min      Past Medical History  Diagnosis Date  . Syncope   . Symptomatic bradycardia   . Chronic lower back pain   . Nonischemic cardiomyopathy   . HTN (hypertension)   . HLD (hyperlipidemia)   . Atrial fibrillation     tachy-brady syndrome with <1% recurrent PAF since pacemaker placement  . H/O: stroke   . Pulmonary embolism     HISTORY OF, the pt. had a recurrent bilateral pulmonary emboli in 2005, on warfarin therapy and at which time she under went implantation of IVC filter  . Anemia   . Dementia   . Gastroesophageal reflux disease   . Aortic stenosis 10/13/2012    Low EF, low gradient with severe aortic stenosis confirmed by dobutamine stress echocardiogram s/p TAVR 12/2012  . PONV (postoperative nausea and vomiting)   . Osteoarthritis   . Neuropathy   . Spinal stenosis   . Symptomatic bradycardia 2012    s/p Medtronic PPM  . Chronic combined systolic and diastolic CHF (congestive heart failure)   . Fibromyalgia   . Presence of permanent cardiac pacemaker   . Sleep apnea     uses oxygen at night and PRN- not used since > 6 months   . Shortness of breath dyspnea     with exertion   . Pneumonia     hx of x 3   . Acute on chronic renal failure     sees Dr Allena Katz   . CKD (chronic kidney disease)   . Headache     hx of migraines   . On home oxygen therapy     patient uses at nite- 2L- has not used in > 6 months per patient    Past Surgical History   Procedure Laterality Date  . Pacemaker insertion      Medtronic  . Transcatheter aortic valve replacement, transfemoral  12/21/2012    a. 29mm Edwards Sapien XT transcatheter heart valve placed via open left transfemoral approach b. Intra-op TEE: well-seated bioprosthetic aortic valve with mean gradient 2 mmHg, trivial AI, mild MR, EF 30-35%  . Transcatheter aortic valve replacement, transfemoral N/A 12/21/2012    Procedure: TRANSCATHETER AORTIC VALVE REPLACEMENT, TRANSFEMORAL;  Surgeon: Tonny Bollman, MD;  Location: [facility] OR;  Service: Open Heart Surgery;  Laterality: N/A;  . Intraoperative transesophageal echocardiogram N/A 12/21/2012    Procedure: INTRAOPERATIVE TRANSESOPHAGEAL ECHOCARDIOGRAM;  Surgeon: Tonny Bollman, MD;  Location: [facility] OR;  Service: Open Heart Surgery;  Laterality: N/A;  . Central venous catheter insertion Left 12/21/2012    Procedure: INSERTION CENTRAL LINE ADULT;  Surgeon: Tonny Bollman, MD;  Location: [facility] OR;  Service: Open Heart Surgery;  Laterality: Left;  . Cataract extraction    . Bunionectomy    . Appendectomy    . Abdominal hysterectomy    . Thyroidectomy, partial    . Back surgery    . Knee surgery Left   .  Nose surgery      X 2  . Left and right heart catheterization with coronary angiogram N/A 10/18/2012    Procedure: LEFT AND RIGHT HEART CATHETERIZATION WITH CORONARY ANGIOGRAM;  Surgeon: Tonny Bollman, MD;  Location: [facility] CATH LAB;  Service: Cardiovascular;  Laterality: N/A;  . Nasal septum surgery    . Total knee arthroplasty Right 12/04/2014    Procedure: RIGHT TOTAL  KNEE ARTHROPLASTY;  Surgeon: Durene Romans, MD;  Location: [facility] ORS;  Service: Orthopedics;  Laterality: Right;    There were no vitals filed for this visit.  Visit Diagnosis:  Stiffness of knee joint, right  Weakness of right leg  Edema, unspecified type  Difficulty walking  Activity intolerance  Right knee pain      Subjective Assessment - 01/31/15 1432    Subjective Pt feels the  kinesiotape helped "support" the knee and reduced pain slightly.     Currently in Pain? Yes   Pain Score 6    Pain Location Knee   Pain Orientation Right            [facility] PT Assessment - 01/31/15 0001    Assessment   Medical Diagnosis RT TKA   Onset Date/Surgical Date 01/04/15   AROM   Right/Left Knee Right   Right Knee Extension -12   Right Knee Flexion 120  (supine, heel slide)             [facility] Adult PT Treatment/Exercise - 01/31/15 0001    Exercises   Exercises Knee/Hip   Knee/Hip Exercises: Aerobic   Nustep L2: 5 min    Knee/Hip Exercises: Seated   Long Arc Quad Right;1 set;5 reps   Sit to [employer] 5 reps;with UE support  encouraged upright posture. Pt report back pain once upright   Knee/Hip Exercises: Supine   Quad Sets Right;2 sets;10 reps   Short Arc Quad Sets 3 sets;10 reps   Heel Slides 10 reps   Modalities   Modalities Vasopneumatic   Vasopneumatic   Number Minutes Vasopneumatic  15 minutes   Vasopnuematic Location  Knee  Rt   Vasopneumatic Pressure Medium   Vasopneumatic Temperature  33 degrees   Manual Therapy   Manual Therapy Taping   Manual therapy comments ktape applied in web pattern over Rt knee to assist with edema reduction                   PT Short Term Goals - 01/24/15 1126    PT SHORT TERM GOAL #1   Title She will improve active extension to 10 degrees and flexin to 125 degrees   Time 4   Period Weeks   Status New   PT SHORT TERM GOAL #2   Title She will be independent wtih initial HEP   Time 4   Period Weeks   Status New   PT SHORT TERM GOAL #3   Title She will improve edema  decrease by 1 cm to improve motion/decr pain   Time 4   Period Weeks   Status New   PT SHORT TERM GOAL #4   Title She will report LT knee pain decreased 50% with getting inand out of chair   Time 4   Period Weeks   Status New   PT SHORT TERM GOAL #5   Title She will report RT knee pain decreased 50% or more with walking   Time 4    Period Weeks   Status New  PT Long Term Goals - 01/24/15 1129    PT LONG TERM GOAL #1   Title She will be independent with all HEP issued as of last visit   Time 12   Period Weeks   Status New   PT LONG TERM GOAL #2   Title She will have 1-3/10 max LT knee pain with all activity   Time 12   Period Weeks   Status New   PT LONG TERM GOAL #3   Title she will report return to normal home tasks as prior to surgery   Time 12   Period Weeks   Status New   PT LONG TERM GOAL #4   Title She will report accessing community as prior to RT TKA surgery   Time 12   Period Weeks   Status New   PT LONG TERM GOAL #5   Title She will walk with SPC in and out of home   Time 12   Period Weeks   Status New   Additional Long Term Goals   Additional Long Term Goals Yes   PT LONG TERM GOAL #6   Title Edema will be improved 2 cm RT knee   Time 12   Period Weeks   Status New               Plan - 01/31/15 1503    Clinical Impression Statement Pt demo improved Rt knee ROM.  Pt continues with decreased standing tolerance and lacks bilateral knee extension.  Pt reported decreased pain in knee with application of Ktape and use of vaso at end of session. Progressing towards goals.    Pt will benefit from skilled therapeutic intervention in order to improve on the following deficits Decreased range of motion;Difficulty walking;Pain;Decreased activity tolerance;Decreased mobility;Decreased strength;Increased edema   PT Frequency 2x / week   PT Duration 12 weeks   PT Treatment/Interventions ADLs/Self Care Home Management;Cryotherapy;Electrical Stimulation;Moist Heat;Therapeutic exercise;Manual techniques;Functional mobility training;Patient/family education;Gait training;Balance training;Passive range of motion;Taping;Vasopneumatic Device   PT Next Visit Plan Manual for pain and swelling, tape, cold, gentle ROM  continue.    Consulted and Agree with Plan of Care Patient         Problem List Patient Active Problem List   Diagnosis Date Noted  . Aspiration pneumonia (HCC) 12/14/2014  . Anemia 12/14/2014  . Aspiration into airway 12/14/2014  . Swallowing dysfunction 12/14/2014  . S/P right TKA 12/04/2014  . S/P knee replacement 12/04/2014  . Acute blood loss anemia 12/28/2012  . Thrombocytopenia (HCC) 12/28/2012  . Toe fracture 12/28/2012  . CKD (chronic kidney disease), stage III 12/28/2012  . Low back pain 12/28/2012  . Wound dehiscence, surgical 12/28/2012  . Atelectasis 12/28/2012  . S/P TAVR (transcatheter aortic valve replacement) 12/21/2012  . Severe aortic stenosis 12/06/2012  . Aortic stenosis 10/13/2012  . Acute on chronic systolic and diastolic heart failure, NYHA class 3 (HCC) 10/13/2012  . Pacemaker 10/15/2010  . Paroxysmal atrial fibrillation (HCC) 10/15/2010  . Dyslipidemia 10/15/2010  . Hypertension 10/15/2010  . Chronic diastolic heart failure (HCC) 10/15/2010    Mayer Camel, PTA 01/31/2015 5:03 PM  [facility] [address] [place], [place], [address] Phone: [phone]   Fax:  [phone]

## 2015-02-01 DIAGNOSIS — I509 Heart failure, unspecified: Secondary | ICD-10-CM | POA: Diagnosis not present

## 2015-02-01 DIAGNOSIS — E039 Hypothyroidism, unspecified: Secondary | ICD-10-CM | POA: Diagnosis not present

## 2015-02-01 DIAGNOSIS — I10 Essential (primary) hypertension: Secondary | ICD-10-CM | POA: Diagnosis not present

## 2015-02-01 DIAGNOSIS — I480 Paroxysmal atrial fibrillation: Secondary | ICD-10-CM | POA: Diagnosis not present

## 2015-02-02 ENCOUNTER — Ambulatory Visit: Payer: Medicare Other | Admitting: Physical Therapy

## 2015-02-02 DIAGNOSIS — R262 Difficulty in walking, not elsewhere classified: Secondary | ICD-10-CM | POA: Diagnosis not present

## 2015-02-02 DIAGNOSIS — R29898 Other symptoms and signs involving the musculoskeletal system: Secondary | ICD-10-CM | POA: Diagnosis not present

## 2015-02-02 DIAGNOSIS — M25561 Pain in right knee: Secondary | ICD-10-CM | POA: Diagnosis not present

## 2015-02-02 DIAGNOSIS — M25661 Stiffness of right knee, not elsewhere classified: Secondary | ICD-10-CM

## 2015-02-02 DIAGNOSIS — R609 Edema, unspecified: Secondary | ICD-10-CM

## 2015-02-02 DIAGNOSIS — R6889 Other general symptoms and signs: Secondary | ICD-10-CM | POA: Diagnosis not present

## 2015-02-02 NOTE — Therapy (Signed)
[facility] [address] [place], [place], [address] Phone: [phone]   Fax:  [phone]  Physical Therapy Treatment  Patient Details  Name: [name] MRN: [id number] Date of Birth: Jul 23, 1932 No Data Recorded  Encounter Date: 02/02/2015      PT End of Session - 02/02/15 1125    Visit Number 4   Number of Visits 24   Date for PT Re-Evaluation 04/18/15   Authorization Type Medicare  KX at 15 viait   PT Start Time 1020   PT Stop Time 1123   PT Time Calculation (min) 63 min      Past Medical History  Diagnosis Date  . Syncope   . Symptomatic bradycardia   . Chronic lower back pain   . Nonischemic cardiomyopathy   . HTN (hypertension)   . HLD (hyperlipidemia)   . Atrial fibrillation     tachy-brady syndrome with <1% recurrent PAF since pacemaker placement  . H/O: stroke   . Pulmonary embolism     HISTORY OF, the pt. had a recurrent bilateral pulmonary emboli in 2005, on warfarin therapy and at which time she under went implantation of IVC filter  . Anemia   . Dementia   . Gastroesophageal reflux disease   . Aortic stenosis 10/13/2012    Low EF, low gradient with severe aortic stenosis confirmed by dobutamine stress echocardiogram s/p TAVR 12/2012  . PONV (postoperative nausea and vomiting)   . Osteoarthritis   . Neuropathy   . Spinal stenosis   . Symptomatic bradycardia 2012    s/p Medtronic PPM  . Chronic combined systolic and diastolic CHF (congestive heart failure)   . Fibromyalgia   . Presence of permanent cardiac pacemaker   . Sleep apnea     uses oxygen at night and PRN- not used since > 6 months   . Shortness of breath dyspnea     with exertion   . Pneumonia     hx of x 3   . Acute on chronic renal failure     sees Dr Allena Katz   . CKD (chronic kidney disease)   . Headache     hx of migraines   . On home oxygen therapy     patient uses at nite- 2L- has not used in > 6 months per patient     Past Surgical History  Procedure Laterality Date  . Pacemaker insertion      Medtronic  . Transcatheter aortic valve replacement, transfemoral  12/21/2012    a. 29mm Edwards Sapien XT transcatheter heart valve placed via open left transfemoral approach b. Intra-op TEE: well-seated bioprosthetic aortic valve with mean gradient 2 mmHg, trivial AI, mild MR, EF 30-35%  . Transcatheter aortic valve replacement, transfemoral N/A 12/21/2012    Procedure: TRANSCATHETER AORTIC VALVE REPLACEMENT, TRANSFEMORAL;  Surgeon: Tonny Bollman, MD;  Location: [facility] OR;  Service: Open Heart Surgery;  Laterality: N/A;  . Intraoperative transesophageal echocardiogram N/A 12/21/2012    Procedure: INTRAOPERATIVE TRANSESOPHAGEAL ECHOCARDIOGRAM;  Surgeon: Tonny Bollman, MD;  Location: [facility] OR;  Service: Open Heart Surgery;  Laterality: N/A;  . Central venous catheter insertion Left 12/21/2012    Procedure: INSERTION CENTRAL LINE ADULT;  Surgeon: Tonny Bollman, MD;  Location: [facility] OR;  Service: Open Heart Surgery;  Laterality: Left;  . Cataract extraction    . Bunionectomy    . Appendectomy    . Abdominal hysterectomy    . Thyroidectomy, partial    . Back surgery    .  Knee surgery Left   . Nose surgery      X 2  . Left and right heart catheterization with coronary angiogram N/A 10/18/2012    Procedure: LEFT AND RIGHT HEART CATHETERIZATION WITH CORONARY ANGIOGRAM;  Surgeon: Tonny Bollman, MD;  Location: [facility] CATH LAB;  Service: Cardiovascular;  Laterality: N/A;  . Nasal septum surgery    . Total knee arthroplasty Right 12/04/2014    Procedure: RIGHT TOTAL  KNEE ARTHROPLASTY;  Surgeon: Durene Romans, MD;  Location: [facility] ORS;  Service: Orthopedics;  Laterality: Right;    There were no vitals filed for this visit.  Visit Diagnosis:  Stiffness of knee joint, right  Weakness of right leg  Edema, unspecified type  Difficulty walking  Right knee pain  Activity intolerance      Subjective Assessment - 02/02/15 1049     Subjective Its getting a little better but the left knee is giving away   Currently in Pain? Yes   Pain Score 6    Pain Location Knee   Pain Orientation Right   Aggravating Factors  walking   Pain Relieving Factors cold, tape                         [facility] Adult PT Treatment/Exercise - 02/02/15 1025    Knee/Hip Exercises: Aerobic   Nustep L2: 5 min    Knee/Hip Exercises: Seated   Long Arc Quad Right;15 reps   Long Arc Quad Limitations 5 sec   Knee/Hip Exercises: Supine   Short Arc Quad Sets 3 sets;10 reps   Water quality scientist Limitations with assist   Terminal Knee Extension 15 reps   Terminal Knee Extension Limitations heel prop and towel behind knee   Straight Leg Raises 10 reps   Straight Leg Raises Limitations -35 lag   Vasopneumatic   Number Minutes Vasopneumatic  15 minutes   Vasopnuematic Location  Knee  rt   Vasopneumatic Pressure Medium   Vasopneumatic Temperature  coldest   Manual Therapy   Manual Therapy Taping   Manual therapy comments ktape applied in web pattern over Rt knee to assist with edema reduction right knee, as well as left knee support tape                PT Education - 02/02/15 1122    Education provided Yes   Education Details Knee Level 1 HEP   Person(s) Educated Patient   Methods Explanation;Handout   Comprehension Verbalized understanding;Need further instruction;Verbal cues required;Tactile cues required          PT Short Term Goals - 01/24/15 1126    PT SHORT TERM GOAL #1   Title She will improve active extension to 10 degrees and flexin to 125 degrees   Time 4   Period Weeks   Status New   PT SHORT TERM GOAL #2   Title She will be independent wtih initial HEP   Time 4   Period Weeks   Status New   PT SHORT TERM GOAL #3   Title She will improve edema  decrease by 1 cm to improve motion/decr pain   Time 4   Period Weeks   Status New   PT SHORT TERM GOAL #4   Title She will report LT knee pain  decreased 50% with getting inand out of chair   Time 4   Period Weeks   Status New   PT SHORT TERM GOAL #5   Title She will  report RT knee pain decreased 50% or more with walking   Time 4   Period Weeks   Status New           PT Long Term Goals - 01/24/15 1129    PT LONG TERM GOAL #1   Title She will be independent with all HEP issued as of last visit   Time 12   Period Weeks   Status New   PT LONG TERM GOAL #2   Title She will have 1-3/10 max LT knee pain with all activity   Time 12   Period Weeks   Status New   PT LONG TERM GOAL #3   Title she will report return to normal home tasks as prior to surgery   Time 12   Period Weeks   Status New   PT LONG TERM GOAL #4   Title She will report accessing community as prior to RT TKA surgery   Time 12   Period Weeks   Status New   PT LONG TERM GOAL #5   Title She will walk with SPC in and out of home   Time 12   Period Weeks   Status New   Additional Long Term Goals   Additional Long Term Goals Yes   PT LONG TERM GOAL #6   Title Edema will be improved 2 cm RT knee   Time 12   Period Weeks   Status New               Plan - 02/02/15 1056    Clinical Impression Statement Pt is concerned about left knee giving away. Requested left knee support tape and new right knee edema tape. Pt demonstrates inablility to attain full knee extension ROM independently. Requires assist with SAQ, LAQ and has extensor lag of 35 degrees with right SLR. Pt given Level 1 HEP as she has no written HEP at home and has memory issues. Pt demonstrates safety issues with transfers as she parks her rollator to the side and furniture walks to sit down. Education  povided on proper stand to sit using rollator.  Pt is at risk for falls due to  significant bilateral kinee weakness.    PT Next Visit Plan Manual for pain and swelling, tape, cold, gentle ROM, quad strength!! , Check goals.         Problem List Patient Active Problem List    Diagnosis Date Noted  . Aspiration pneumonia (HCC) 12/14/2014  . Anemia 12/14/2014  . Aspiration into airway 12/14/2014  . Swallowing dysfunction 12/14/2014  . S/P right TKA 12/04/2014  . S/P knee replacement 12/04/2014  . Acute blood loss anemia 12/28/2012  . Thrombocytopenia (HCC) 12/28/2012  . Toe fracture 12/28/2012  . CKD (chronic kidney disease), stage III 12/28/2012  . Low back pain 12/28/2012  . Wound dehiscence, surgical 12/28/2012  . Atelectasis 12/28/2012  . S/P TAVR (transcatheter aortic valve replacement) 12/21/2012  . Severe aortic stenosis 12/06/2012  . Aortic stenosis 10/13/2012  . Acute on chronic systolic and diastolic heart failure, NYHA class 3 (HCC) 10/13/2012  . Pacemaker 10/15/2010  . Paroxysmal atrial fibrillation (HCC) 10/15/2010  . Dyslipidemia 10/15/2010  . Hypertension 10/15/2010  . Chronic diastolic heart failure (HCC) 10/15/2010    Sherrie Mustache, PTA 02/02/2015, 11:32 AM  [facility] [address] [place], [place], [address] Phone: [phone]   Fax:  [phone]  Name: [name] MRN: [id number] Date of Birth: November 20, 1932

## 2015-02-02 NOTE — Patient Instructions (Signed)
   Copyright  VHI. All rights reserved.  HIP: Flexion / KNEE: Extension, Straight Leg Raise   Raise leg, keeping knee straight. Perform slowly. _10__ reps per set, __2_ sets per day, _7__ days per week   Copyright  VHI. All rights reserved.  Heel Slide   Bend knee and pull heel toward buttocks. Hold __5__ seconds. Return. Repeat with other knee. Repeat __10__ times. Do _2___ sessions per day.  http://[email or web address].exer.us/372   Copyright  VHI. All rights reserved.    USE OTHER LEG TO HELP LIFT IT UP! THEN TRY TO HOLD!  Raise leg until knee is straight. _10-20__ reps per set, _2__ sets per day, _7__ days per week  Copyright  VHI. All rights reserved.  Short Arc [employer] a large can or rolled towel under leg. Straighten knee and leg. Hold __5__ seconds. Repeat with other leg. Repeat _10___ times. Do __2__ sessions per day.  http://gt2.exer.us/365   Copyright  VHI. All rights reserved.  Quad Set   Slowly tighten muscles on thigh of straight leg while counting out loud to __5__. Repeat with other leg. Repeat __10__ times. Do _2___ sessions per day.  http://[email or web address].exer.us/361   Copyright  VHI. All rights reserved.

## 2015-02-06 ENCOUNTER — Ambulatory Visit: Payer: Medicare Other

## 2015-02-06 DIAGNOSIS — R6889 Other general symptoms and signs: Secondary | ICD-10-CM | POA: Diagnosis not present

## 2015-02-06 DIAGNOSIS — M25661 Stiffness of right knee, not elsewhere classified: Secondary | ICD-10-CM

## 2015-02-06 DIAGNOSIS — R609 Edema, unspecified: Secondary | ICD-10-CM | POA: Diagnosis not present

## 2015-02-06 DIAGNOSIS — R262 Difficulty in walking, not elsewhere classified: Secondary | ICD-10-CM

## 2015-02-06 DIAGNOSIS — R29898 Other symptoms and signs involving the musculoskeletal system: Secondary | ICD-10-CM

## 2015-02-06 DIAGNOSIS — M25561 Pain in right knee: Secondary | ICD-10-CM

## 2015-02-06 NOTE — Therapy (Signed)
[facility] [address] [place], [place], [address] Phone: [phone]   Fax:  [phone]  Physical Therapy Treatment  Patient Details  Name: [name] MRN: [id number] Date of Birth: February 13, 1933 No Data Recorded  Encounter Date: 02/06/2015      PT End of Session - 02/06/15 1428    Visit Number 5   Number of Visits 24   Date for PT Re-Evaluation 04/18/15   PT Start Time 0223  late 8 min   PT Stop Time 0320   PT Time Calculation (min) 57 min   Activity Tolerance Patient tolerated treatment well;Patient limited by pain   Behavior During Therapy [facility] for tasks assessed/performed      Past Medical History  Diagnosis Date  . Syncope   . Symptomatic bradycardia   . Chronic lower back pain   . Nonischemic cardiomyopathy   . HTN (hypertension)   . HLD (hyperlipidemia)   . Atrial fibrillation     tachy-brady syndrome with <1% recurrent PAF since pacemaker placement  . H/O: stroke   . Pulmonary embolism     HISTORY OF, the pt. had a recurrent bilateral pulmonary emboli in 2005, on warfarin therapy and at which time she under went implantation of IVC filter  . Anemia   . Dementia   . Gastroesophageal reflux disease   . Aortic stenosis 10/13/2012    Low EF, low gradient with severe aortic stenosis confirmed by dobutamine stress echocardiogram s/p TAVR 12/2012  . PONV (postoperative nausea and vomiting)   . Osteoarthritis   . Neuropathy   . Spinal stenosis   . Symptomatic bradycardia 2012    s/p Medtronic PPM  . Chronic combined systolic and diastolic CHF (congestive heart failure)   . Fibromyalgia   . Presence of permanent cardiac pacemaker   . Sleep apnea     uses oxygen at night and PRN- not used since > 6 months   . Shortness of breath dyspnea     with exertion   . Pneumonia     hx of x 3   . Acute on chronic renal failure     sees Dr Allena Katz   . CKD (chronic kidney disease)   . Headache     hx of migraines    . On home oxygen therapy     patient uses at nite- 2L- has not used in > 6 months per patient    Past Surgical History  Procedure Laterality Date  . Pacemaker insertion      Medtronic  . Transcatheter aortic valve replacement, transfemoral  12/21/2012    a. 29mm Edwards Sapien XT transcatheter heart valve placed via open left transfemoral approach b. Intra-op TEE: well-seated bioprosthetic aortic valve with mean gradient 2 mmHg, trivial AI, mild MR, EF 30-35%  . Transcatheter aortic valve replacement, transfemoral N/A 12/21/2012    Procedure: TRANSCATHETER AORTIC VALVE REPLACEMENT, TRANSFEMORAL;  Surgeon: Tonny Bollman, MD;  Location: [facility] OR;  Service: Open Heart Surgery;  Laterality: N/A;  . Intraoperative transesophageal echocardiogram N/A 12/21/2012    Procedure: INTRAOPERATIVE TRANSESOPHAGEAL ECHOCARDIOGRAM;  Surgeon: Tonny Bollman, MD;  Location: [facility] OR;  Service: Open Heart Surgery;  Laterality: N/A;  . Central venous catheter insertion Left 12/21/2012    Procedure: INSERTION CENTRAL LINE ADULT;  Surgeon: Tonny Bollman, MD;  Location: [facility] OR;  Service: Open Heart Surgery;  Laterality: Left;  . Cataract extraction    . Bunionectomy    . Appendectomy    . Abdominal hysterectomy    .  Thyroidectomy, partial    . Back surgery    . Knee surgery Left   . Nose surgery      X 2  . Left and right heart catheterization with coronary angiogram N/A 10/18/2012    Procedure: LEFT AND RIGHT HEART CATHETERIZATION WITH CORONARY ANGIOGRAM;  Surgeon: Tonny Bollman, MD;  Location: [facility] CATH LAB;  Service: Cardiovascular;  Laterality: N/A;  . Nasal septum surgery    . Total knee arthroplasty Right 12/04/2014    Procedure: RIGHT TOTAL  KNEE ARTHROPLASTY;  Surgeon: Durene Romans, MD;  Location: [facility] ORS;  Service: Orthopedics;  Laterality: Right;    There were no vitals filed for this visit.  Visit Diagnosis:  Stiffness of knee joint, right  Weakness of right leg  Edema, unspecified type  Difficulty  walking  Right knee pain      Subjective Assessment - 02/06/15 1429    Subjective Pain in both buttock and posterior legs. in past week or so. Usually only in one hip but now in both.  Medical RT knee hurts too.    Currently in Pain? Yes   Pain Score 5    Pain Location Knee  and posterior legs   Pain Orientation Right   Pain Descriptors / Indicators Aching;Sore   Pain Type Acute pain   Pain Radiating Towards post legs   Pain Onset 1 to 4 weeks ago   Pain Frequency Constant   Aggravating Factors  Walking, anything   Pain Relieving Factors cold rest   Multiple Pain Sites Yes                         [facility] Adult PT Treatment/Exercise - 02/06/15 0001    Neuro Re-ed    Neuro Re-ed Details  We worked on terminal RT knee extension  with verbal and tactile cues  to facilitate.    Knee/Hip Exercises: Aerobic   Nustep L4 7.5 min with LE only   Knee/Hip Exercises: Seated   Long Arc Quad Right;15 reps;Left   Long Arc Quad Weight 3 lbs.   Long [employer] Limitations 2-3 sec   Other Seated Knee/Hip Exercises Seated terminal extension with slide of heel on pillow case with leg lift at end. Lag appeared to be less near end of set   Hamstring Curl Strengthening;Right;15 reps   Hamstring Limitations red band   Cryotherapy   Number Minutes Cryotherapy 12 Minutes   Cryotherapy Location Knee   Type of Cryotherapy Ice pack   Manual Therapy   Manual therapy comments added tap 2 y's prox to distal and distal tp roximal  medial RT knee.                   PT Short Term Goals - 02/06/15 1547    PT SHORT TERM GOAL #1   Title She will improve active extension to 10 degrees and flexin to 125 degrees   Status On-going   PT SHORT TERM GOAL #2   Title She will be independent wtih initial HEP   Status On-going   PT SHORT TERM GOAL #3   Title She will improve edema  decrease by 1 cm to improve motion/decr pain   Status Unable to assess   PT SHORT TERM GOAL #4   Title She  will report LT knee pain decreased 50% with getting inand out of chair   Status On-going   PT SHORT TERM GOAL #5   Title She will report RT  knee pain decreased 50% or more with walking   Status On-going           PT Long Term Goals - 01/24/15 1129    PT LONG TERM GOAL #1   Title She will be independent with all HEP issued as of last visit   Time 12   Period Weeks   Status New   PT LONG TERM GOAL #2   Title She will have 1-3/10 max LT knee pain with all activity   Time 12   Period Weeks   Status New   PT LONG TERM GOAL #3   Title she will report return to normal home tasks as prior to surgery   Time 12   Period Weeks   Status New   PT LONG TERM GOAL #4   Title She will report accessing community as prior to RT TKA surgery   Time 12   Period Weeks   Status New   PT LONG TERM GOAL #5   Title She will walk with SPC in and out of home   Time 12   Period Weeks   Status New   Additional Long Term Goals   Additional Long Term Goals Yes   PT LONG TERM GOAL #6   Title Edema will be improved 2 cm RT knee   Time 12   Period Weeks   Status New               Plan - 02/06/15 1544    Clinical Impression Statement Ms Shafiq continues to be limited with active RT knee range with quad lag and stiffness. She is having more pain symptoms in posterior buttocks and legs that is consistent and worse than in past. I suggested she contact her back MD or ortho MD for assessment of this pain as it is different than in past.    PT Next Visit Plan Manual for pain and swelling, tape, cold,  ROM, quad strength!! ,    Consulted and Agree with Plan of Care Patient        Problem List Patient Active Problem List   Diagnosis Date Noted  . Aspiration pneumonia (HCC) 12/14/2014  . Anemia 12/14/2014  . Aspiration into airway 12/14/2014  . Swallowing dysfunction 12/14/2014  . S/P right TKA 12/04/2014  . S/P knee replacement 12/04/2014  . Acute blood loss anemia 12/28/2012  .  Thrombocytopenia (HCC) 12/28/2012  . Toe fracture 12/28/2012  . CKD (chronic kidney disease), stage III 12/28/2012  . Low back pain 12/28/2012  . Wound dehiscence, surgical 12/28/2012  . Atelectasis 12/28/2012  . S/P TAVR (transcatheter aortic valve replacement) 12/21/2012  . Severe aortic stenosis 12/06/2012  . Aortic stenosis 10/13/2012  . Acute on chronic systolic and diastolic heart failure, NYHA class 3 (HCC) 10/13/2012  . Pacemaker 10/15/2010  . Paroxysmal atrial fibrillation (HCC) 10/15/2010  . Dyslipidemia 10/15/2010  . Hypertension 10/15/2010  . Chronic diastolic heart failure (HCC) 10/15/2010    Caprice Red PT 02/06/2015, 3:48 PM  [facility] [address] [place], [place], [address] Phone: [phone]   Fax:  [phone]  Name: KAMIKA PIRRELLO MRN: [id number] Date of Birth: [birth year]

## 2015-02-08 ENCOUNTER — Encounter: Payer: Self-pay | Admitting: Internal Medicine

## 2015-02-14 ENCOUNTER — Encounter: Payer: Medicare Other | Admitting: Physical Therapy

## 2015-02-19 ENCOUNTER — Ambulatory Visit: Payer: Medicare Other

## 2015-02-19 DIAGNOSIS — R609 Edema, unspecified: Secondary | ICD-10-CM | POA: Diagnosis not present

## 2015-02-19 DIAGNOSIS — R6889 Other general symptoms and signs: Secondary | ICD-10-CM | POA: Diagnosis not present

## 2015-02-19 DIAGNOSIS — R262 Difficulty in walking, not elsewhere classified: Secondary | ICD-10-CM

## 2015-02-19 DIAGNOSIS — M25561 Pain in right knee: Secondary | ICD-10-CM

## 2015-02-19 DIAGNOSIS — R29898 Other symptoms and signs involving the musculoskeletal system: Secondary | ICD-10-CM

## 2015-02-19 DIAGNOSIS — M25661 Stiffness of right knee, not elsewhere classified: Secondary | ICD-10-CM | POA: Diagnosis not present

## 2015-02-19 NOTE — Therapy (Signed)
[facility] [address] [place], [place], [address] Phone: [phone]   Fax:  [phone]  Physical Therapy Treatment  Patient Details  Name: [name] MRN: [id number] Date of Birth: April 13, 1933 No Data Recorded  Encounter Date: 02/19/2015      PT End of Session - 02/19/15 1418    Visit Number 6   Number of Visits 24   Date for PT Re-Evaluation 04/18/15   PT Start Time 0217   PT Stop Time 0315   PT Time Calculation (min) 58 min   Activity Tolerance Patient tolerated treatment well;Patient limited by pain   Behavior During Therapy [facility] for tasks assessed/performed      Past Medical History  Diagnosis Date  . Syncope   . Symptomatic bradycardia   . Chronic lower back pain   . Nonischemic cardiomyopathy   . HTN (hypertension)   . HLD (hyperlipidemia)   . Atrial fibrillation     tachy-brady syndrome with <1% recurrent PAF since pacemaker placement  . H/O: stroke   . Pulmonary embolism     HISTORY OF, the pt. had a recurrent bilateral pulmonary emboli in 2005, on warfarin therapy and at which time she under went implantation of IVC filter  . Anemia   . Dementia   . Gastroesophageal reflux disease   . Aortic stenosis 10/13/2012    Low EF, low gradient with severe aortic stenosis confirmed by dobutamine stress echocardiogram s/p TAVR 12/2012  . PONV (postoperative nausea and vomiting)   . Osteoarthritis   . Neuropathy   . Spinal stenosis   . Symptomatic bradycardia 2012    s/p Medtronic PPM  . Chronic combined systolic and diastolic CHF (congestive heart failure)   . Fibromyalgia   . Presence of permanent cardiac pacemaker   . Sleep apnea     uses oxygen at night and PRN- not used since > 6 months   . Shortness of breath dyspnea     with exertion   . Pneumonia     hx of x 3   . Acute on chronic renal failure     sees Dr Allena Katz   . CKD (chronic kidney disease)   . Headache     hx of migraines   . On home  oxygen therapy     patient uses at nite- 2L- has not used in > 6 months per patient    Past Surgical History  Procedure Laterality Date  . Pacemaker insertion      Medtronic  . Transcatheter aortic valve replacement, transfemoral  12/21/2012    a. 92mm Edwards Sapien XT transcatheter heart valve placed via open left transfemoral approach b. Intra-op TEE: well-seated bioprosthetic aortic valve with mean gradient 2 mmHg, trivial AI, mild MR, EF 30-35%  . Transcatheter aortic valve replacement, transfemoral N/A 12/21/2012    Procedure: TRANSCATHETER AORTIC VALVE REPLACEMENT, TRANSFEMORAL;  Surgeon: Tonny Bollman, MD;  Location: [facility] OR;  Service: Open Heart Surgery;  Laterality: N/A;  . Intraoperative transesophageal echocardiogram N/A 12/21/2012    Procedure: INTRAOPERATIVE TRANSESOPHAGEAL ECHOCARDIOGRAM;  Surgeon: Tonny Bollman, MD;  Location: [facility] OR;  Service: Open Heart Surgery;  Laterality: N/A;  . Central venous catheter insertion Left 12/21/2012    Procedure: INSERTION CENTRAL LINE ADULT;  Surgeon: Tonny Bollman, MD;  Location: [facility] OR;  Service: Open Heart Surgery;  Laterality: Left;  . Cataract extraction    . Bunionectomy    . Appendectomy    . Abdominal hysterectomy    . Thyroidectomy,  partial    . Back surgery    . Knee surgery Left   . Nose surgery      X 2  . Left and right heart catheterization with coronary angiogram N/A 10/18/2012    Procedure: LEFT AND RIGHT HEART CATHETERIZATION WITH CORONARY ANGIOGRAM;  Surgeon: Tonny Bollman, MD;  Location: [facility] CATH LAB;  Service: Cardiovascular;  Laterality: N/A;  . Nasal septum surgery    . Total knee arthroplasty Right 12/04/2014    Procedure: RIGHT TOTAL  KNEE ARTHROPLASTY;  Surgeon: Durene Romans, MD;  Location: [facility] ORS;  Service: Orthopedics;  Laterality: Right;    There were no vitals filed for this visit.  Visit Diagnosis:  Stiffness of knee joint, right  Weakness of right leg  Edema, unspecified type  Difficulty walking  Right  knee pain      Subjective Assessment - 02/19/15 1427    Subjective Sore as I fell last friday while i was standing at my sink.    Currently in Pain? Yes   Pain Score 6    Pain Location Knee   Pain Orientation Right;Anterior   Pain Descriptors / Indicators Aching;Sore   Pain Type Acute pain   Pain Onset More than a month ago   Pain Frequency Constant   Aggravating Factors  anything   Pain Relieving Factors rest , cold   Multiple Pain Sites Yes            [facility] PT Assessment - 02/19/15 1448    Circumferential Edema   Circumferential - Right 44.5 cm  mid patella   AROM   Right Knee Flexion 120                     [facility] Adult PT Treatment/Exercise - 02/19/15 1423    Knee/Hip Exercises: Aerobic   Nustep L5  LE only 7 min     Knee/Hip Exercises: Seated   Long Arc Quad Right;15 reps   Long Arc Quad Weight 4 lbs.   Long [employer] Limitations 2-3 sec   Other Seated Knee/Hip Exercises Seated terminal extension with slide of heel on pillow case with leg lift at end. Lag appeared to be less near end of set   Hamstring Curl Strengthening;Right;20 reps   Hamstring Limitations red band   Knee/Hip Exercises: Supine   Quad Sets AROM;Right;10 reps   Short Arc Quad Sets Limitations Asssisted  End range extension to facilitate strength with holds an dslow eccentrics. x40   Manual Therapy   Manual therapy comments @ C;s around patella and 2 Y's for quad facilitation                  PT Short Term Goals - 02/19/15 1502    PT SHORT TERM GOAL #1   Title She will improve active extension to 10 degrees and flexion to 125 degrees   Status On-going   PT SHORT TERM GOAL #2   Title She will be independent wtih initial HEP   Status On-going   PT SHORT TERM GOAL #3   Title She will improve edema  decrease by 1 cm to improve motion/decr pain   Status Achieved   PT SHORT TERM GOAL #4   Title She will report LT knee pain decreased 50% with getting inand out of chair    Status On-going   PT SHORT TERM GOAL #5   Title She will report RT knee pain decreased 50% or more with walking   Status On-going  PT Long Term Goals - 01/24/15 1129    PT LONG TERM GOAL #1   Title She will be independent with all HEP issued as of last visit   Time 12   Period Weeks   Status New   PT LONG TERM GOAL #2   Title She will have 1-3/10 max LT knee pain with all activity   Time 12   Period Weeks   Status New   PT LONG TERM GOAL #3   Title she will report return to normal home tasks as prior to surgery   Time 12   Period Weeks   Status New   PT LONG TERM GOAL #4   Title She will report accessing community as prior to RT TKA surgery   Time 12   Period Weeks   Status New   PT LONG TERM GOAL #5   Title She will walk with SPC in and out of home   Time 12   Period Weeks   Status New   Additional Long Term Goals   Additional Long Term Goals Yes   PT LONG TERM GOAL #6   Title Edema will be improved 2 cm RT knee   Time 12   Period Weeks   Status New               Plan - 02/19/15 1506    Clinical Impression Statement Weakness making mobility and safety of feet and fall risk. LT knee also painful and she wants tape to the LT  knee for stability .I suggested that she should discuss this with MD and a brace may be more appropriate than tape ongoing   PT Next Visit Plan Manual for pain and swelling, tape, cold,  ROM, quad strength!! ,    Consulted and Agree with Plan of Care Patient        Problem List Patient Active Problem List   Diagnosis Date Noted  . Aspiration pneumonia (HCC) 12/14/2014  . Anemia 12/14/2014  . Aspiration into airway 12/14/2014  . Swallowing dysfunction 12/14/2014  . S/P right TKA 12/04/2014  . S/P knee replacement 12/04/2014  . Acute blood loss anemia 12/28/2012  . Thrombocytopenia (HCC) 12/28/2012  . Toe fracture 12/28/2012  . CKD (chronic kidney disease), stage III 12/28/2012  . Low back pain 12/28/2012  . Wound  dehiscence, surgical 12/28/2012  . Atelectasis 12/28/2012  . S/P TAVR (transcatheter aortic valve replacement) 12/21/2012  . Severe aortic stenosis 12/06/2012  . Aortic stenosis 10/13/2012  . Acute on chronic systolic and diastolic heart failure, NYHA class 3 (HCC) 10/13/2012  . Pacemaker 10/15/2010  . Paroxysmal atrial fibrillation (HCC) 10/15/2010  . Dyslipidemia 10/15/2010  . Hypertension 10/15/2010  . Chronic diastolic heart failure (HCC) 10/15/2010    Caprice Red PT 02/19/2015, 3:09 PM  [facility] [address] [place], [place], [address] Phone: [phone]   Fax:  [phone]  Name: [name] MRN: [id number] Date of Birth: 1932-12-20

## 2015-02-20 DIAGNOSIS — Z7901 Long term (current) use of anticoagulants: Secondary | ICD-10-CM | POA: Diagnosis not present

## 2015-02-20 DIAGNOSIS — I4891 Unspecified atrial fibrillation: Secondary | ICD-10-CM | POA: Diagnosis not present

## 2015-02-20 DIAGNOSIS — M549 Dorsalgia, unspecified: Secondary | ICD-10-CM | POA: Diagnosis not present

## 2015-02-21 DIAGNOSIS — N2581 Secondary hyperparathyroidism of renal origin: Secondary | ICD-10-CM | POA: Diagnosis not present

## 2015-02-21 DIAGNOSIS — N189 Chronic kidney disease, unspecified: Secondary | ICD-10-CM | POA: Diagnosis not present

## 2015-02-21 DIAGNOSIS — N184 Chronic kidney disease, stage 4 (severe): Secondary | ICD-10-CM | POA: Diagnosis not present

## 2015-02-22 ENCOUNTER — Encounter: Payer: Medicare Other | Admitting: Physical Therapy

## 2015-02-22 ENCOUNTER — Ambulatory Visit: Payer: Medicare Other | Attending: Orthopedic Surgery

## 2015-02-22 DIAGNOSIS — N2581 Secondary hyperparathyroidism of renal origin: Secondary | ICD-10-CM | POA: Diagnosis not present

## 2015-02-22 DIAGNOSIS — N184 Chronic kidney disease, stage 4 (severe): Secondary | ICD-10-CM | POA: Diagnosis not present

## 2015-02-22 DIAGNOSIS — I129 Hypertensive chronic kidney disease with stage 1 through stage 4 chronic kidney disease, or unspecified chronic kidney disease: Secondary | ICD-10-CM | POA: Diagnosis not present

## 2015-02-22 DIAGNOSIS — M25661 Stiffness of right knee, not elsewhere classified: Secondary | ICD-10-CM | POA: Diagnosis not present

## 2015-02-22 DIAGNOSIS — R262 Difficulty in walking, not elsewhere classified: Secondary | ICD-10-CM | POA: Diagnosis not present

## 2015-02-22 DIAGNOSIS — R29898 Other symptoms and signs involving the musculoskeletal system: Secondary | ICD-10-CM

## 2015-02-22 DIAGNOSIS — D631 Anemia in chronic kidney disease: Secondary | ICD-10-CM | POA: Diagnosis not present

## 2015-02-22 NOTE — Therapy (Addendum)
[place], [place], [address] Phone: [phone]   Fax:  [phone]  Physical Therapy Treatment  Patient Details  Name: CHARNETTA WULFF MRN: [id number] Date of Birth: December 12, 1932 No Data Recorded  Encounter Date: 02/22/2015      PT End of Session - 02/22/15 1132    Visit Number 7   Number of Visits 24   Date for PT Re-Evaluation 04/18/15   PT Start Time 1114  Late by 11 min   PT Stop Time 1152   PT Time Calculation (min) 38 min   Activity Tolerance Patient tolerated treatment well   Behavior During Therapy [facility] for tasks assessed/performed      Past Medical History  Diagnosis Date  . Syncope   . Symptomatic bradycardia   . Chronic lower back pain   . Nonischemic cardiomyopathy ([place])   . HTN (hypertension)   . HLD (hyperlipidemia)   . Atrial fibrillation     tachy-brady syndrome with <1% recurrent PAF since pacemaker placement  . H/O: stroke   . Pulmonary embolism     HISTORY OF, the pt. had a recurrent bilateral pulmonary emboli in 2005, on warfarin therapy and at which time she under went implantation of IVC filter  . Anemia   . Dementia   . Gastroesophageal reflux disease   . Aortic stenosis 10/13/2012    Low EF, low gradient with severe aortic stenosis confirmed by dobutamine stress echocardiogram s/p TAVR 12/2012  . PONV (postoperative nausea and vomiting)   . Osteoarthritis   . Neuropathy   . Spinal stenosis   . Symptomatic bradycardia 2012    s/p Medtronic PPM  . Chronic combined systolic and diastolic CHF (congestive heart failure)   . Fibromyalgia   . Presence of permanent cardiac pacemaker   . Sleep apnea     uses oxygen at night and PRN- not used since > 6 months   . Shortness of breath dyspnea     with exertion   . Pneumonia     hx of x 3   . Acute on chronic renal failure     sees Dr Posey Pronto   . CKD (chronic kidney disease)   . Headache     hx of migraines   . On home  oxygen therapy     patient uses at nite- 2L- has not used in > 6 months per patient    Past Surgical History  Procedure Laterality Date  . Pacemaker insertion      Medtronic  . Transcatheter aortic valve replacement, transfemoral  12/21/2012    a. 22mm Edwards Sapien XT transcatheter heart valve placed via open left transfemoral approach b. Intra-op TEE: well-seated bioprosthetic aortic valve with mean gradient 2 mmHg, trivial AI, mild MR, EF 30-35%  . Transcatheter aortic valve replacement, transfemoral N/A 12/21/2012    Procedure: TRANSCATHETER AORTIC VALVE REPLACEMENT, TRANSFEMORAL;  Surgeon: Sherren Mocha, MD;  Location: [place];  Service: Open Heart Surgery;  Laterality: N/A;  . Intraoperative transesophageal echocardiogram N/A 12/21/2012    Procedure: INTRAOPERATIVE TRANSESOPHAGEAL ECHOCARDIOGRAM;  Surgeon: Sherren Mocha, MD;  Location: [facility];  Service: Open Heart Surgery;  Laterality: N/A;  . Central venous catheter insertion Left 12/21/2012    Procedure: INSERTION CENTRAL LINE ADULT;  Surgeon: Sherren Mocha, MD;  Location: [place];  Service: Open Heart Surgery;  Laterality: Left;  . Cataract extraction    . Bunionectomy    . Appendectomy    . Abdominal hysterectomy    .  Thyroidectomy, partial    . Back surgery    . Knee surgery Left   . Nose surgery      X 2  . Left and right heart catheterization with coronary angiogram N/A 10/18/2012    Procedure: LEFT AND RIGHT HEART CATHETERIZATION WITH CORONARY ANGIOGRAM;  Surgeon: Sherren Mocha, MD;  Location: [facility] CATH LAB;  Service: Cardiovascular;  Laterality: N/A;  . Nasal septum surgery    . Total knee arthroplasty Right 12/04/2014    Procedure: RIGHT TOTAL  KNEE ARTHROPLASTY;  Surgeon: Paralee Cancel, MD;  Location: [facility];  Service: Orthopedics;  Laterality: Right;    There were no vitals filed for this visit.  Visit Diagnosis:  Stiffness of knee joint, right  Weakness of right leg  Difficulty walking      Subjective Assessment -  02/22/15 1119    Subjective i fell again.I can't remember the location or what happened.  I suggested she see MD    Currently in Pain? Yes   Pain Score 6    Pain Location Knee   Pain Orientation Right;Anterior   Pain Descriptors / Indicators Aching   Pain Onset More than a month ago            [facility] PT Assessment - 02/22/15 1122    AROM   Right Knee Extension -38   Right Knee Flexion 120   Left Knee Extension -12   Left Knee Flexion 103   PROM   Right Knee Extension 12   Strength   Right Knee Flexion 5/5   Right Knee Extension 2+/5   Left Knee Flexion 5/5   Left Knee Extension 5/5                     [facility] Adult PT Treatment/Exercise - 02/22/15 0001    Neuro Re-ed    Neuro Re-ed Details  Worked on standing with erect posture and terminal knee extension. both knees   Knee/Hip Exercises: Seated   Long Arc Quad Right;Left;20 reps   Long Arc Quad Weight 3 lbs.   Long Arc Quad Limitations 2-3 sec   Heel Slides Limitations using fitter with one blue cord x 15 then 2 blue cpords x 15                  PT Short Term Goals - 02/19/15 1502    PT SHORT TERM GOAL #1   Title She will improve active extension to 10 degrees and flexion to 125 degrees   Status On-going   PT SHORT TERM GOAL #2   Title She will be independent wtih initial HEP   Status On-going   PT SHORT TERM GOAL #3   Title She will improve edema  decrease by 1 cm to improve motion/decr pain   Status Achieved   PT SHORT TERM GOAL #4   Title She will report LT knee pain decreased 50% with getting inand out of chair   Status On-going   PT SHORT TERM GOAL #5   Title She will report RT knee pain decreased 50% or more with walking   Status On-going           PT Long Term Goals - 01/24/15 1129    PT LONG TERM GOAL #1   Title She will be independent with all HEP issued as of last visit   Time 12   Period Weeks   Status New   PT LONG TERM GOAL #2   Title She will  have 1-3/10 max LT  knee pain with all activity   Time 12   Period Weeks   Status New   PT LONG TERM GOAL #3   Title she will report return to normal home tasks as prior to surgery   Time 12   Period Weeks   Status New   PT LONG TERM GOAL #4   Title She will report accessing community as prior to RT TKA surgery   Time 12   Period Weeks   Status New   PT LONG TERM GOAL #5   Title She will walk with SPC in and out of home   Time 12   Period Weeks   Status New   Additional Long Term Goals   Additional Long Term Goals Yes   PT LONG TERM GOAL #6   Title Edema will be improved 2 cm RT knee   Time 12   Period Weeks   Status New               Plan - 02/22/15 1134    Clinical Impression Statement RT quad weakness is making her unsafe on feet and it appears she is falling due to weakness and not having UE support  while on feet. . She also has pain with eight bearing on LT knee. These factors and limiting mobility and making being on feet a high risk of fall.    PT Next Visit Plan Continue strengthening for hips but emphasis on RT quad strength   Consulted and Agree with Plan of Care Patient     We discussed need to have walker ar all times for safety . Emphasized need to  Speak to MD about options for LT knee pain. Weakness for RT knee will take an extended period to improve to >3/5   Problem List Patient Active Problem List   Diagnosis Date Noted  . Aspiration pneumonia ([place]) 12/14/2014  . Anemia 12/14/2014  . Aspiration into airway 12/14/2014  . Swallowing dysfunction 12/14/2014  . S/P right TKA 12/04/2014  . S/P knee replacement 12/04/2014  . Acute blood loss anemia 12/28/2012  . Thrombocytopenia ([place]) 12/28/2012  . Toe fracture 12/28/2012  . CKD (chronic kidney disease), stage III 12/28/2012  . Low back pain 12/28/2012  . Wound dehiscence, surgical 12/28/2012  . Atelectasis 12/28/2012  . S/P TAVR (transcatheter aortic valve replacement) 12/21/2012  . Severe aortic stenosis  12/06/2012  . Aortic stenosis 10/13/2012  . Acute on chronic systolic and diastolic heart failure, NYHA class 3 ([place]) 10/13/2012  . Pacemaker 10/15/2010  . Paroxysmal atrial fibrillation ([place]) 10/15/2010  . Dyslipidemia 10/15/2010  . Hypertension 10/15/2010  . Chronic diastolic heart failure ([place]) 10/15/2010    Darrel Hoover PT 02/22/2015, 12:01 PM  [place] [facility] [address] [place], [place], [address] Phone: [phone]   Fax:  [phone]  Name: THIA OLESEN MRN: [id number] Date of Birth: [birth year]    PHYSICAL THERAPY DISCHARGE SUMMARY  Visits from Start of Care: 7  Current functional level related to goals / functional outcomes: Unknown as she did not return   Remaining deficits: Unknown   Education / Equipment: HEP  Plan:                                                    Patient goals were  not met. Patient is being discharged due to not returning since the last visit.  ?????    Lillette Boxer Taelar Gronewold PT  07/12/15                               8:35 AM

## 2015-02-26 ENCOUNTER — Ambulatory Visit: Payer: Medicare Other

## 2015-02-26 DIAGNOSIS — R29898 Other symptoms and signs involving the musculoskeletal system: Secondary | ICD-10-CM | POA: Diagnosis not present

## 2015-02-26 DIAGNOSIS — M25661 Stiffness of right knee, not elsewhere classified: Secondary | ICD-10-CM | POA: Diagnosis not present

## 2015-02-26 DIAGNOSIS — R262 Difficulty in walking, not elsewhere classified: Secondary | ICD-10-CM | POA: Diagnosis not present

## 2015-02-26 NOTE — Therapy (Addendum)
[place], [place], [address] Phone: [phone]   Fax:  [phone]  Physical Therapy Treatment  Patient Details  Name: [name] MRN: [id number] Date of Birth: 10-Apr-1933 No Data Recorded  Encounter Date: 02/26/2015      PT End of Session - 02/26/15 1435    Visit Number 8   Number of Visits 24   Date for PT Re-Evaluation 04/18/15   PT Start Time 0220   PT Stop Time 0315   PT Time Calculation (min) 55 min   Activity Tolerance Patient tolerated treatment well  Limited by quad weakness   Behavior During Therapy [facility] for tasks assessed/performed      Past Medical History  Diagnosis Date  . Syncope   . Symptomatic bradycardia   . Chronic lower back pain   . Nonischemic cardiomyopathy ([place])   . HTN (hypertension)   . HLD (hyperlipidemia)   . Atrial fibrillation     tachy-brady syndrome with <1% recurrent PAF since pacemaker placement  . H/O: stroke   . Pulmonary embolism     HISTORY OF, the pt. had a recurrent bilateral pulmonary emboli in 2005, on warfarin therapy and at which time she under went implantation of IVC filter  . Anemia   . Dementia   . Gastroesophageal reflux disease   . Aortic stenosis 10/13/2012    Low EF, low gradient with severe aortic stenosis confirmed by dobutamine stress echocardiogram s/p TAVR 12/2012  . PONV (postoperative nausea and vomiting)   . Osteoarthritis   . Neuropathy   . Spinal stenosis   . Symptomatic bradycardia 2012    s/p Medtronic PPM  . Chronic combined systolic and diastolic CHF (congestive heart failure)   . Fibromyalgia   . Presence of permanent cardiac pacemaker   . Sleep apnea     uses oxygen at night and PRN- not used since > 6 months   . Shortness of breath dyspnea     with exertion   . Pneumonia     hx of x 3   . Acute on chronic renal failure     sees Dr Posey Pronto   . CKD (chronic kidney disease)   . Headache     hx of migraines   .  On home oxygen therapy     patient uses at nite- 2L- has not used in > 6 months per patient    Past Surgical History  Procedure Laterality Date  . Pacemaker insertion      Medtronic  . Transcatheter aortic valve replacement, transfemoral  12/21/2012    a. 21mm Edwards Sapien XT transcatheter heart valve placed via open left transfemoral approach b. Intra-op TEE: well-seated bioprosthetic aortic valve with mean gradient 2 mmHg, trivial AI, mild MR, EF 30-35%  . Transcatheter aortic valve replacement, transfemoral N/A 12/21/2012    Procedure: TRANSCATHETER AORTIC VALVE REPLACEMENT, TRANSFEMORAL;  Surgeon: Sherren Mocha, MD;  Location: [place];  Service: Open Heart Surgery;  Laterality: N/A;  . Intraoperative transesophageal echocardiogram N/A 12/21/2012    Procedure: INTRAOPERATIVE TRANSESOPHAGEAL ECHOCARDIOGRAM;  Surgeon: Sherren Mocha, MD;  Location: [facility] OR;  Service: Open Heart Surgery;  Laterality: N/A;  . Central venous catheter insertion Left 12/21/2012    Procedure: INSERTION CENTRAL LINE ADULT;  Surgeon: Sherren Mocha, MD;  Location: [place];  Service: Open Heart Surgery;  Laterality: Left;  . Cataract extraction    . Bunionectomy    . Appendectomy    . Abdominal hysterectomy    .  Thyroidectomy, partial    . Back surgery    . Knee surgery Left   . Nose surgery      X 2  . Left and right heart catheterization with coronary angiogram N/A 10/18/2012    Procedure: LEFT AND RIGHT HEART CATHETERIZATION WITH CORONARY ANGIOGRAM;  Surgeon: Tonny Bollman, MD;  Location: [facility] CATH LAB;  Service: Cardiovascular;  Laterality: N/A;  . Nasal septum surgery    . Total knee arthroplasty Right 12/04/2014    Procedure: RIGHT TOTAL  KNEE ARTHROPLASTY;  Surgeon: Durene Romans, MD;  [facility];  Service: Orthopedics;  Laterality: Right;    There were no vitals filed for this visit.  Visit Diagnosis:  Stiffness of knee joint, right  Weakness of right leg  Difficulty walking      Subjective  Assessment - 02/26/15 1422    Subjective Doing OK. No falls.    Currently in Pain? Yes   Pain Score 6    Pain Location Knee   Pain Orientation Right;Anterior   Pain Descriptors / Indicators Aching   Pain Onset More than a month ago   Pain Frequency Constant   Aggravating Factors  activity, anything   Pain Relieving Factors rest cold            [facility] PT Assessment - 02/26/15 0001    AROM   Right Knee Extension -38   Right Knee Flexion 120  supine heel slide   Left Knee Extension -11   Left Knee Flexion 108   Strength   Right Knee Flexion 5/5   Right Knee Extension 2+/5   Left Knee Flexion 5/5   Left Knee Extension 5/5                     [facility] Adult PT Treatment/Exercise - 02/26/15 1424    Knee/Hip Exercises: Aerobic   Nustep L5  LE only 6 min     Knee/Hip Exercises: Supine   Quad Sets AROM;Right;20 reps;2 sets   Quad Sets Limitations She is able to enerate a ood quad set but is not able to sustain with weight of RT lower leg/.    Short Arc [employer] 15 reps;3 sets;Right;Strengthening   Short Arc [employer] Limitations assisted in end range and she is able to continue contraction og quad wuthout ability to hold in terminal 25 degrees.    Knee/Hip Exercises: Sidelying   Other Sidelying Knee/Hip Exercises RT knee extension in horizontal plane x 25 with verbal encouragement for end range extension then inclined  at 12-15 degrees incline with knee extension x 20 and 10 reps withj  2 pounds on ankle asssisted to max extension range.     Cryotherapy   Number Minutes Cryotherapy 12 Minutes   Cryotherapy Location Knee   Type of Cryotherapy Ice pack   Manual Therapy   Manual therapy comments 2 C;s around patella and 2 Y's for quad facilitation                  PT Short Term Goals - 02/26/15 1506    PT SHORT TERM GOAL #1   Title She will improve active extension to 10 degrees and flexion to 125 degrees   Baseline 120 degrees   Status On-going   PT  SHORT TERM GOAL #2   Title She will be independent wtih initial HEP   Baseline She is able to do HEP but no sure she is doing them consistently   Status Achieved  PT SHORT TERM GOAL #3   Title She will improve edema  decrease by 1 cm to improve motion/decr pain   Status Achieved   PT SHORT TERM GOAL #4   Title She will report LT knee pain decreased 50% with getting in and out of chair   Status On-going   PT SHORT TERM GOAL #5   Title She will report RT knee pain decreased 50% or more with walking   Status On-going           PT Long Term Goals - 01/24/15 1129    PT LONG TERM GOAL #1   Title She will be independent with all HEP issued as of last visit   Time 12   Period Weeks   Status New   PT LONG TERM GOAL #2   Title She will have 1-3/10 max LT knee pain with all activity   Time 12   Period Weeks   Status New   PT LONG TERM GOAL #3   Title she will report return to normal home tasks as prior to surgery   Time 12   Period Weeks   Status New   PT LONG TERM GOAL #4   Title She will report accessing community as prior to RT TKA surgery   Time 12   Period Weeks   Status New   PT LONG TERM GOAL #5   Title She will walk with SPC in and out of home   Time 12   Period Weeks   Status New   Additional Long Term Goals   Additional Long Term Goals Yes   PT LONG TERM GOAL #6   Title Edema will be improved 2 cm RT knee   Time 12   Period Weeks   Status New            G-Codes - 05-07-2015 1524    Functional Assessment Tool Used FOTO 68%   Functional Limitation Mobility: Walking and moving around   Mobility: Walking and Moving Around Goal Status [phone]) At least 40 percent but less than 60 percent impaired, limited or restricted   Mobility: Walking and Moving Around Discharge Status [phone]) At least 60 percent but less than 80 percent impaired, limited or restricted             Plan - 02/26/15 1505    Clinical Impression Statement RT quad weakness continues to  make [name] a fall risk and is limiting her mobility/progress. She has fallen more than once and this appears she was not using walker at time and has been reminded to have walker in front at all times  when on her feet.    PT Next Visit Plan Continue strengthening for hips but emphasis on RT quad strength   Consulted and Agree with Plan of Care Patient        Problem List Patient Active Problem List   Diagnosis Date Noted  . Aspiration pneumonia ([place]) 12/14/2014  . Anemia 12/14/2014  . Aspiration into airway 12/14/2014  . Swallowing dysfunction 12/14/2014  . S/P right TKA 12/04/2014  . S/P knee replacement 12/04/2014  . Acute blood loss anemia 12/28/2012  . Thrombocytopenia ([place]) 12/28/2012  . Toe fracture 12/28/2012  . CKD (chronic kidney disease), stage III 12/28/2012  . Low back pain 12/28/2012  . Wound dehiscence, surgical 12/28/2012  . Atelectasis 12/28/2012  . S/P TAVR (transcatheter aortic valve replacement) 12/21/2012  . Severe aortic stenosis 12/06/2012  . Aortic stenosis 10/13/2012  .  Acute on chronic systolic and diastolic heart failure, NYHA class 3 ([place]) 10/13/2012  . Pacemaker 10/15/2010  . Paroxysmal atrial fibrillation ([place]) 10/15/2010  . Dyslipidemia 10/15/2010  . Hypertension 10/15/2010  . Chronic diastolic heart failure ([place]) 10/15/2010    Darrel Hoover PT 02/26/2015, 3:08 PM  [facility] [address] [place], [place], [address] Phone: [phone]   Fax:  [phone]  Name: [name] MRN: [id number] Date of Birth: March 27, 1933    PHYSICAL THERAPY DISCHARGE SUMMARY  Visits from Start of Care: 8  Current functional level related to goals / functional outcomes: See above   Remaining deficits: See above; pt canceled due to having to have surgery   Education / Equipment: HEP  Plan: Patient agrees to discharge.  Patient goals were not met. Patient is being discharged due to a  change in medical status.  ?????    Laureen Abrahams, PT, DPT 04/11/2015 3:26 PM  [facility] [address] [place] [address]  [phone] (office) [phone] (fax)

## 2015-02-28 ENCOUNTER — Other Ambulatory Visit: Payer: Self-pay

## 2015-02-28 ENCOUNTER — Encounter: Payer: Medicare Other | Admitting: Physical Therapy

## 2015-02-28 DIAGNOSIS — Z471 Aftercare following joint replacement surgery: Secondary | ICD-10-CM | POA: Diagnosis not present

## 2015-02-28 DIAGNOSIS — I350 Nonrheumatic aortic (valve) stenosis: Secondary | ICD-10-CM

## 2015-02-28 DIAGNOSIS — Z96651 Presence of right artificial knee joint: Secondary | ICD-10-CM | POA: Diagnosis not present

## 2015-02-28 DIAGNOSIS — Z952 Presence of prosthetic heart valve: Secondary | ICD-10-CM

## 2015-02-28 DIAGNOSIS — S86891A Other injury of other muscle(s) and tendon(s) at lower leg level, right leg, initial encounter: Secondary | ICD-10-CM | POA: Diagnosis not present

## 2015-03-01 ENCOUNTER — Telehealth: Payer: Self-pay | Admitting: Physical Therapy

## 2015-03-01 NOTE — Telephone Encounter (Signed)
Per front office, patient's son called to inform us that she would be having knee surgery.

## 2015-03-02 ENCOUNTER — Ambulatory Visit: Payer: Medicare Other

## 2015-03-02 ENCOUNTER — Encounter: Payer: Self-pay | Admitting: *Deleted

## 2015-03-04 DIAGNOSIS — I509 Heart failure, unspecified: Secondary | ICD-10-CM | POA: Diagnosis not present

## 2015-03-06 ENCOUNTER — Other Ambulatory Visit: Payer: Self-pay

## 2015-03-06 ENCOUNTER — Ambulatory Visit (INDEPENDENT_AMBULATORY_CARE_PROVIDER_SITE_OTHER): Payer: Medicare Other | Admitting: Cardiovascular Disease

## 2015-03-06 ENCOUNTER — Encounter: Payer: Self-pay | Admitting: Cardiovascular Disease

## 2015-03-06 ENCOUNTER — Ambulatory Visit (HOSPITAL_BASED_OUTPATIENT_CLINIC_OR_DEPARTMENT_OTHER): Payer: Medicare Other

## 2015-03-06 VITALS — BP 136/68 | HR 83 | Ht 60.0 in | Wt 177.8 lb

## 2015-03-06 DIAGNOSIS — I351 Nonrheumatic aortic (valve) insufficiency: Secondary | ICD-10-CM

## 2015-03-06 DIAGNOSIS — Z954 Presence of other heart-valve replacement: Secondary | ICD-10-CM | POA: Diagnosis not present

## 2015-03-06 DIAGNOSIS — I350 Nonrheumatic aortic (valve) stenosis: Secondary | ICD-10-CM | POA: Diagnosis not present

## 2015-03-06 DIAGNOSIS — E785 Hyperlipidemia, unspecified: Secondary | ICD-10-CM

## 2015-03-06 DIAGNOSIS — I071 Rheumatic tricuspid insufficiency: Secondary | ICD-10-CM

## 2015-03-06 DIAGNOSIS — I517 Cardiomegaly: Secondary | ICD-10-CM

## 2015-03-06 DIAGNOSIS — I10 Essential (primary) hypertension: Secondary | ICD-10-CM

## 2015-03-06 DIAGNOSIS — I059 Rheumatic mitral valve disease, unspecified: Secondary | ICD-10-CM | POA: Insufficient documentation

## 2015-03-06 DIAGNOSIS — Z952 Presence of prosthetic heart valve: Secondary | ICD-10-CM | POA: Insufficient documentation

## 2015-03-06 DIAGNOSIS — I359 Nonrheumatic aortic valve disorder, unspecified: Secondary | ICD-10-CM

## 2015-03-06 DIAGNOSIS — I5189 Other ill-defined heart diseases: Secondary | ICD-10-CM | POA: Insufficient documentation

## 2015-03-06 DIAGNOSIS — I4891 Unspecified atrial fibrillation: Secondary | ICD-10-CM | POA: Diagnosis not present

## 2015-03-06 DIAGNOSIS — Z7901 Long term (current) use of anticoagulants: Secondary | ICD-10-CM | POA: Diagnosis not present

## 2015-03-06 NOTE — Progress Notes (Signed)
Cardiology Office Note Date:  03/06/2015   ID:  [name], DOB 05/25/1932, MRN [id number]  PCP:  Pearson Grippe, MD  Cardiologist:  Tonny Bollman, MD    Chief Complaint  Patient presents with  . Follow-up    no refill     History of Present Illness: [name] is a 81 y.o. female who presents for TAVR follow-up, now two years out from transfemoral TAVR with a 29 mm Edwards Sapien XT valve. Comorbid conditions include chronic systolic heart failure, CKD IV, and severe arthritis. Prior to TAVR, her LVEF was severely reduced, but LV function improved post-TAVR to the 50-55% range.  She had right total knee replacement 3 months ago, and now has to have repair of a patellar injury with surgery scheduled later this week.   From a cardiac perspective, she is doing okay. She denies chest pain or chest pressure. She does admit to shortness of breath with activity. She is primarily limited by back and knee problems. She is maintained on chronic oral anticoagulation for atrial fibrillation. She denies bleeding problems.  Past Medical History  Diagnosis Date  . Syncope   . Symptomatic bradycardia   . Chronic lower back pain   . Nonischemic cardiomyopathy (HCC)   . HTN (hypertension)   . HLD (hyperlipidemia)   . Atrial fibrillation (HCC)     tachy-brady syndrome with <1% recurrent PAF since pacemaker placement  . H/O: stroke   . Pulmonary embolism (HCC)     HISTORY OF, the pt. had a recurrent bilateral pulmonary emboli in 2005, on warfarin therapy and at which time she under went implantation of IVC filter  . Anemia   . Dementia   . Gastroesophageal reflux disease   . Aortic stenosis 10/13/2012    Low EF, low gradient with severe aortic stenosis confirmed by dobutamine stress echocardiogram s/p TAVR 12/2012  . PONV (postoperative nausea and vomiting)   . Osteoarthritis   . Neuropathy (HCC)   . Spinal stenosis   . Symptomatic bradycardia 2012    s/p Medtronic PPM  . Chronic  combined systolic and diastolic CHF (congestive heart failure) (HCC)   . Fibromyalgia   . Presence of permanent cardiac pacemaker   . Sleep apnea     uses oxygen at night and PRN- not used since > 6 months   . Shortness of breath dyspnea     with exertion   . Pneumonia     hx of x 3   . Acute on chronic renal failure [facility])     sees Dr Allena Katz   . CKD (chronic kidney disease)   . Headache     hx of migraines   . On home oxygen therapy     patient uses at nite- 2L- has not used in > 6 months per patient    Past Surgical History  Procedure Laterality Date  . Pacemaker insertion      Medtronic  . Transcatheter aortic valve replacement, transfemoral  12/21/2012    a. 29mm Edwards Sapien XT transcatheter heart valve placed via open left transfemoral approach b. Intra-op TEE: well-seated bioprosthetic aortic valve with mean gradient 2 mmHg, trivial AI, mild MR, EF 30-35%  . Transcatheter aortic valve replacement, transfemoral N/A 12/21/2012    Procedure: TRANSCATHETER AORTIC VALVE REPLACEMENT, TRANSFEMORAL;  Surgeon: Tonny Bollman, MD;  Location: [facility] OR;  Service: Open Heart Surgery;  Laterality: N/A;  . Intraoperative transesophageal echocardiogram N/A 12/21/2012    Procedure: INTRAOPERATIVE TRANSESOPHAGEAL ECHOCARDIOGRAM;  Surgeon:  Tonny Bollman, MD;  Location: [facility] OR;  Service: Open Heart Surgery;  Laterality: N/A;  . Central venous catheter insertion Left 12/21/2012    Procedure: INSERTION CENTRAL LINE ADULT;  Surgeon: Tonny Bollman, MD;  Location: [facility] OR;  Service: Open Heart Surgery;  Laterality: Left;  . Cataract extraction    . Bunionectomy    . Appendectomy    . Abdominal hysterectomy    . Thyroidectomy, partial    . Back surgery    . Knee surgery Left   . Nose surgery      X 2  . Left and right heart catheterization with coronary angiogram N/A 10/18/2012    Procedure: LEFT AND RIGHT HEART CATHETERIZATION WITH CORONARY ANGIOGRAM;  Surgeon: Tonny Bollman, MD;  Location: [facility] CATH LAB;   Service: Cardiovascular;  Laterality: N/A;  . Nasal septum surgery    . Total knee arthroplasty Right 12/04/2014    Procedure: RIGHT TOTAL  KNEE ARTHROPLASTY;  Surgeon: Durene Romans, MD;  Location: [facility] ORS;  Service: Orthopedics;  Laterality: Right;    Current Outpatient Prescriptions  Medication Sig Dispense Refill  . alendronate (FOSAMAX) 70 MG tablet Take 70 mg by mouth once a week. Saturday.    Marland Kitchen amiodarone (PACERONE) 200 MG tablet Take 0.5 tablets (100 mg total) by mouth daily.    Marland Kitchen BIOTIN 5000 PO Take 5,000 mcg by mouth every evening.     . calcitRIOL (ROCALTROL) 0.25 MCG capsule Take 0.25 mcg by mouth every morning.     . carvedilol (COREG) 12.5 MG tablet Take 1 tablet by mouth 2 (two) times daily.  3  . cetirizine (ZYRTEC) 10 MG tablet Take 10 mg by mouth every morning.     . Cholecalciferol (VITAMIN D) 1000 UNITS capsule Take 2,000 Units by mouth at bedtime.     . colchicine 0.6 MG tablet Take 0.5 tablets (0.3 mg total) by mouth daily. 30 tablet 1  . cycloSPORINE (RESTASIS) 0.05 % ophthalmic emulsion Place 1 drop into both eyes 2 (two) times daily as needed (dry eyes.).    Marland Kitchen docusate sodium (COLACE) 50 MG capsule Take 50 mg by mouth 2 (two) times daily.     Marland Kitchen donepezil (ARICEPT) 10 MG tablet Take 10 mg by mouth at bedtime.     . DULoxetine (CYMBALTA) 60 MG capsule Take 60 mg by mouth every evening.     . ferrous sulfate 325 (65 FE) MG tablet Take 1 tablet (325 mg total) by mouth 3 (three) times daily after meals.  3  . hydrALAZINE (APRESOLINE) 10 MG tablet Take 10 mg by mouth 3 (three) times daily.     Marland Kitchen HYDROcodone-acetaminophen (NORCO/VICODIN) 5-325 MG per tablet Take 1-2 tablets by mouth every 6 (six) hours as needed for moderate pain.    Marland Kitchen ipratropium-albuterol (DUONEB) 0.5-2.5 (3) MG/3ML SOLN Take 3 mLs by nebulization every morning.    Marland Kitchen levothyroxine (SYNTHROID, LEVOTHROID) 100 MCG tablet Take 100 mcg by mouth every morning.     . Linaclotide (LINZESS) 145 MCG CAPS capsule Take  145 mcg by mouth every other day. Alternates with the 290 mg tablet in the am.    . Linaclotide 290 MCG CAPS Take 290 mcg by mouth every other day. Alternates with the 145 mg tablets in the morning.    . memantine (NAMENDA) 10 MG tablet Take 10 mg by mouth 2 (two) times daily.     . Multiple Vitamin (MULTIVITAMIN) tablet Take 0.5 tablets by mouth every morning.     . nitroGLYCERIN (  NITROSTAT) 0.4 MG SL tablet Place 0.4 mg under the tongue every 5 (five) minutes as needed for chest pain (MAX 3 TABLETS).     . NON FORMULARY 2 Liters oxygen as needed for shortness of breath    . omeprazole (PRILOSEC) 20 MG capsule Take 20 mg by mouth daily.    . polyethylene glycol (MIRALAX / GLYCOLAX) packet Take 17 g by mouth 2 (two) times daily. 14 each 0  . pravastatin (PRAVACHOL) 40 MG tablet Take 40 mg by mouth every evening.     . pregabalin (LYRICA) 75 MG capsule Take 75 mg by mouth 2 (two) times daily.     Marland Kitchen torsemide (DEMADEX) 20 MG tablet Take 20 mg by mouth 2 (two) times daily. PEr Dr Allena Katz - patient to not take on 8/14 nor am of 8/15    . Turmeric 500 MG CAPS Take 1 capsule by mouth 2 (two) times daily.    Marland Kitchen ULORIC 40 MG tablet Take 40 mg by mouth every evening.     . vitamin B-12 (CYANOCOBALAMIN) 1000 MCG tablet Take 1,000 mcg by mouth 2 (two) times a week. Tuesday and Friday    . warfarin (COUMADIN) 5 MG tablet Take 1 tablet by mouth daily.     No current facility-administered medications for this visit.   Facility-Administered Medications Ordered in Other Visits  Medication Dose Route Frequency [address] Last Rate Last Dose  . sodium bicarbonate first hour bolus via infusion   Intravenous Once Pearson Grippe, MD        Allergies:   Codeine; Darvocet; and Nubain   Social History:  The patient  reports that she has never smoked. She has never used smokeless tobacco. She reports that she does not drink alcohol or use illicit drugs.   Family History:  The patient's  family history includes Epilepsy in  her father; Ovarian cancer in her mother.    ROS:  Please see the history of present illness.  Otherwise, review of systems is positive for DOE, back pain, constipation, joint swelling, balance problems.  All other systems are reviewed and negative.    PHYSICAL EXAM: VS:  BP 136/68 mmHg  Pulse 83  Ht 5' (1.524 m)  Wt 177 lb 12.8 oz (80.65 kg)  BMI 34.72 kg/m2 , BMI Body mass index is 34.72 kg/(m^2). GEN: Well nourished, well developed, in no acute distress HEENT: normal Neck: no JVD, no masses. No carotid bruits Cardiac: RRR with 2/6 systolic murmur at the LSB              Respiratory:  clear to auscultation bilaterally, normal work of breathing GI: soft, nontender, nondistended, + BS MS: no deformity or atrophy Ext: 1+ pretibial edema Skin: warm and dry, no rash Neuro:  Strength and sensation are intact Psych: euthymic mood, full affect  EKG:  EKG is not ordered today.  Recent Labs: 12/15/2014: ALT 32; BUN 71*; Creatinine, Ser 2.54*; Hemoglobin 8.2*; Platelets 261; Potassium 4.1; Sodium 140; TSH 6.001*   Lipid Panel     Component Value Date/Time   CHOL  06/16/2010 0415    89        ATP III CLASSIFICATION:  <200     mg/dL   Desirable  809-983  mg/dL   Borderline High  >=382    mg/dL   High          TRIG 64 06/16/2010 0415   HDL 49 06/16/2010 0415   CHOLHDL 1.8 06/16/2010 0415   VLDL 13 06/16/2010  0415   LDLCALC  06/16/2010 0415    27        Total Cholesterol/HDL:CHD Risk Coronary Heart Disease Risk Table                     Men   Women  1/2 Average Risk   3.4   3.3  Average Risk       5.0   4.4  2 X Average Risk   9.6   7.1  3 X Average Risk  23.4   11.0        Use the calculated Patient Ratio above and the CHD Risk Table to determine the patient's CHD Risk.        ATP III CLASSIFICATION (LDL):  <100     mg/dL   Optimal  161-096  mg/dL   Near or Above                    Optimal  130-159  mg/dL   Borderline  045-409  mg/dL   High  >811     mg/dL   Very  High      Wt Readings from Last 3 Encounters:  03/06/15 177 lb 12.8 oz (80.65 kg)  12/22/14 169 lb 6.4 oz (76.839 kg)  12/15/14 167 lb 15.9 oz (76.2 kg)    ASSESSMENT AND PLAN: 1.   Aortic valve disorder, 2 years status post TAVR: The patient has [place] heart association functional class II symptoms. She will continue on her current medical program. I personally reviewed her echo images today. Valve function is normal with a mean gradient of 11 and peak gradient of 22 mmHg. LV function appears in the low-normal range, primarily to conduction delay.  I do not appreciate any segmental wall motion abnormalities. The patient continues on oral anticoagulation in the setting of atrial fibrillation.  2. Paroxysmal atrial fibrillation: continues on warfarin without bleeding problems. No symptoms at present.  3. Chronic systolic heart failure, NYHA 2: continues on carvedilol and hydralazine. With advanced kidney disease she is not a candidate for ACE/ARB. Also on torsemide.  4. Preop: pt for knee surgery. Appears stable from cardiac perspective. OK to proceed at low risk of cardiac complications.  Current medicines are reviewed with the patient today.  The patient does not have concerns regarding medicines.  Labs/ tests ordered today include:  No orders of the defined types were placed in this encounter.   Disposition:   FU one year  Signed, Tonny Bollman, MD  03/06/2015 6:21 PM    [facility] [address] [place], [place], [place]  [address] Phone: [phone]; Fax: [phone]

## 2015-03-06 NOTE — Patient Instructions (Signed)
Medication Instructions:  Your physician recommends that you continue on your current medications as directed. Please refer to the Current Medication list given to you today.  Labwork: No new orders.   Testing/Procedures: No new orders.   Follow-Up: Your physician wants you to follow-up in:  1 YEAR with Dr Excell Seltzer.  You will receive a reminder letter in the mail two months in advance. If you don't receive a letter, please call our office to schedule the follow-up appointment.  Continue pacemaker follow-up with Dr Ladona Ridgel.    Any Other Special Instructions Will Be Listed Below (If Applicable).     If you need a refill on your cardiac medications before your next appointment, please call your pharmacy.

## 2015-03-07 ENCOUNTER — Ambulatory Visit: Payer: Medicare Other | Admitting: Physical Therapy

## 2015-03-07 ENCOUNTER — Encounter: Payer: Medicare Other | Admitting: Physical Therapy

## 2015-03-07 ENCOUNTER — Encounter (HOSPITAL_COMMUNITY)
Admission: RE | Admit: 2015-03-07 | Discharge: 2015-03-07 | Disposition: A | Discharge status: Home / Self Care | Payer: Medicare Other | Source: Ambulatory Visit | Attending: Orthopedic Surgery | Admitting: Orthopedic Surgery

## 2015-03-07 ENCOUNTER — Encounter (HOSPITAL_COMMUNITY): Payer: Self-pay

## 2015-03-07 HISTORY — DX: Nonrheumatic aortic (valve) insufficiency: I35.1

## 2015-03-07 HISTORY — DX: Personal history of other specified conditions: Z87.898

## 2015-03-07 HISTORY — DX: Cardiac arrhythmia, unspecified: I49.9

## 2015-03-07 HISTORY — DX: Personal history of urinary (tract) infections: Z87.440

## 2015-03-07 HISTORY — DX: Unspecified hearing loss, unspecified ear: H91.90

## 2015-03-07 HISTORY — DX: Unspecified urinary incontinence: R32

## 2015-03-07 HISTORY — DX: Personal history of other medical treatment: Z92.89

## 2015-03-07 HISTORY — DX: Sciatica, unspecified side: M54.30

## 2015-03-07 HISTORY — DX: Unspecified asthma, uncomplicated: J45.909

## 2015-03-07 HISTORY — DX: Cardiac murmur, unspecified: R01.1

## 2015-03-07 HISTORY — DX: Personal history of other diseases of the respiratory system: Z87.09

## 2015-03-07 HISTORY — DX: Hypothyroidism, unspecified: E03.9

## 2015-03-07 HISTORY — DX: Unspecified cataract: H26.9

## 2015-03-07 HISTORY — DX: Repeated falls: R29.6

## 2015-03-07 HISTORY — DX: Personal history of urinary calculi: Z87.442

## 2015-03-07 HISTORY — DX: Urgency of urination: R39.15

## 2015-03-07 LAB — URINALYSIS, ROUTINE W REFLEX MICROSCOPIC
Bilirubin Urine: NEGATIVE
GLUCOSE, UA: NEGATIVE mg/dL
Hgb urine dipstick: NEGATIVE
Ketones, ur: NEGATIVE mg/dL
Nitrite: NEGATIVE
PH: 6 (ref 5.0–8.0)
PROTEIN: NEGATIVE mg/dL
Specific Gravity, Urine: 1.01 (ref 1.005–1.030)

## 2015-03-07 LAB — SURGICAL PCR SCREEN
MRSA, PCR: NEGATIVE
STAPHYLOCOCCUS AUREUS: NEGATIVE

## 2015-03-07 LAB — CBC
HCT: 33.4 % — ABNORMAL LOW (ref 36.0–46.0)
Hemoglobin: 10.7 g/dL — ABNORMAL LOW (ref 12.0–15.0)
MCH: 30.2 pg (ref 26.0–34.0)
MCHC: 32 g/dL (ref 30.0–36.0)
MCV: 94.4 fL (ref 78.0–100.0)
Platelets: 215 10*3/uL (ref 150–400)
RBC: 3.54 MIL/uL — ABNORMAL LOW (ref 3.87–5.11)
RDW: 14.6 % (ref 11.5–15.5)
WBC: 7.4 10*3/uL (ref 4.0–10.5)

## 2015-03-07 LAB — BASIC METABOLIC PANEL
Anion gap: 6 (ref 5–15)
BUN: 51 mg/dL — AB (ref 6–20)
CALCIUM: 9.8 mg/dL (ref 8.9–10.3)
CO2: 31 mmol/L (ref 22–32)
CREATININE: 2.34 mg/dL — AB (ref 0.44–1.00)
Chloride: 104 mmol/L (ref 101–111)
GFR calc Af Amer: 21 mL/min — ABNORMAL LOW (ref >60)
GFR, EST NON AFRICAN AMERICAN: 19 mL/min — AB (ref >60)
GLUCOSE: 108 mg/dL — AB (ref 65–99)
Potassium: 5.3 mmol/L — ABNORMAL HIGH (ref 3.5–5.1)
Sodium: 141 mmol/L (ref 135–145)

## 2015-03-07 LAB — APTT: APTT: 44 s — AB (ref 24–37)

## 2015-03-07 LAB — URINE MICROSCOPIC-ADD ON
BACTERIA UA: NONE SEEN
RBC / HPF: NONE SEEN RBC/hpf (ref 0–5)

## 2015-03-07 LAB — PROTIME-INR
INR: 2.71 — AB (ref 0.00–1.49)
PROTHROMBIN TIME: 28.4 s — AB (ref 11.6–15.2)

## 2015-03-07 NOTE — Progress Notes (Addendum)
BMP, PT and PTT, urinalysis and micro results in epic per PAT visit 03/07/2015 sent to Dr Charlann Boxer

## 2015-03-07 NOTE — Patient Instructions (Signed)
[name]  03/07/2015   Your procedure is scheduled on: Thursday March 08, 2015  Report to [facility]  Entrance take [place]  elevators to 3rd floor to  Short Stay Center at 2:00 PM.  Call this number if you have problems the morning of surgery [phone]   Remember: ONLY 1 PERSON MAY GO WITH YOU TO SHORT STAY TO GET  READY MORNING OF YOUR SURGERY.  Do not eat food After Midnight but may take clear liquids till 10 am day of surgery then nothing by mouth.      Take these medicines the morning of surgery with A SIP OF WATER: AMIODARONE; CARVEDILOL (Coreg); Cetirizine (Zyrtec); Calcitriol; Hilda Blades; Hydralazine; Hydrocodone-Acetaminophen if needed; May use Nebulizer treatment if needed; Omeprazole: Levothyroxine                               You may not have any metal on your body including hair pins and              piercings  Do not wear jewelry, make-up, lotions, powders or perfumes, deodorant             Do not wear nail polish.  Do not shave  48 hours prior to surgery.              Do not bring valuables to the hospital. [facility] IS NOT             RESPONSIBLE   FOR VALUABLES.  Contacts, dentures or bridgework may not be worn into surgery.  Leave suitcase in the car. After surgery it may be brought to your room.                Please read over the following fact sheets you were given:BLOOD TRANSFUSION INFORMATION SHEET; MRSA INFORMATION SHEET; INCENTIVE SPIROMETER _____________________________________________________________________             [facility] [place] - Preparing for Surgery Before surgery, you can play an important role.  Because skin is not sterile, your skin needs to be as free of germs as possible.  You can reduce the number of germs on your skin by washing with CHG (chlorahexidine gluconate) soap before surgery.  CHG is an antiseptic cleaner which kills germs and bonds with the skin to continue killing germs even after  washing. Please DO NOT use if you have an allergy to CHG or antibacterial soaps.  If your skin becomes reddened/irritated stop using the CHG and inform your nurse when you arrive at Short Stay. Do not shave (including legs and underarms) for at least 48 hours prior to the first CHG shower.  You may shave your face/neck. Please follow these instructions carefully:  1.  Shower with CHG Soap the night before surgery and the  morning of Surgery.  2.  If you choose to wash your hair, wash your hair first as usual with your  normal  shampoo.  3.  After you shampoo, rinse your hair and body thoroughly to remove the  shampoo.                           4.  Use CHG as you would any other liquid soap.  You can apply chg directly  to the skin and wash  Gently with a scrungie or clean washcloth.  5.  Apply the CHG Soap to your body ONLY FROM THE NECK DOWN.   Do not use on face/ open                           Wound or open sores. Avoid contact with eyes, ears mouth and genitals (private parts).                       Wash face,  Genitals (private parts) with your normal soap.             6.  Wash thoroughly, paying special attention to the area where your surgery  will be performed.  7.  Thoroughly rinse your body with warm water from the neck down.  8.  DO NOT shower/wash with your normal soap after using and rinsing off  the CHG Soap.                9.  Pat yourself dry with a clean towel.            10.  Wear clean pajamas.            11.  Place clean sheets on your bed the night of your first shower and do not  sleep with pets. Day of Surgery : Do not apply any lotions/deodorants the morning of surgery.  Please wear clean clothes to the hospital/surgery center.  FAILURE TO FOLLOW THESE INSTRUCTIONS MAY RESULT IN THE CANCELLATION OF YOUR SURGERY PATIENT SIGNATURE_________________________________  NURSE  SIGNATURE__________________________________  ________________________________________________________________________    CLEAR LIQUID DIET   Foods Allowed                                                                     Foods Excluded  Coffee and tea, regular and decaf                             liquids that you cannot  Plain Jell-O in any flavor                                             see through such as: Fruit ices (not with fruit pulp)                                     milk, soups, orange juice  Iced Popsicles                                    All solid food Carbonated beverages, regular and diet                                    Cranberry, grape and apple juices Sports drinks like Gatorade Lightly seasoned clear broth or consume(fat free) Sugar, honey syrup  Sample Menu Breakfast                                Lunch                                     Supper Cranberry juice                    Beef broth                            Chicken broth Jell-O                                     Grape juice                           Apple juice Coffee or tea                        Jell-O                                      Popsicle                                                Coffee or tea                        Coffee or tea  _____________________________________________________________________    Incentive Spirometer  An incentive spirometer is a tool that can help keep your lungs clear and active. This tool measures how well you are filling your lungs with each breath. Taking long deep breaths may help reverse or decrease the chance of developing breathing (pulmonary) problems (especially infection) following:  A long period of time when you are unable to move or be active. BEFORE THE PROCEDURE   If the spirometer includes an indicator to show your best effort, your nurse or respiratory therapist will set it to a desired goal.  If possible, sit up straight or lean  slightly forward. Try not to slouch.  Hold the incentive spirometer in an upright position. INSTRUCTIONS FOR USE   Sit on the edge of your bed if possible, or sit up as far as you can in bed or on a chair.  Hold the incentive spirometer in an upright position.  Breathe out normally.  Place the mouthpiece in your mouth and seal your lips tightly around it.  Breathe in slowly and as deeply as possible, raising the piston or the ball toward the top of the column.  Hold your breath for 3-5 seconds or for as long as possible. Allow the piston or ball to fall to the bottom of the column.  Remove the mouthpiece from your mouth and breathe out normally.  Rest for a few seconds and repeat Steps 1 through 7 at least 10 times every 1-2 hours when you are awake. Take your time and take a few normal breaths between  deep breaths.  The spirometer may include an indicator to show your best effort. Use the indicator as a goal to work toward during each repetition.  After each set of 10 deep breaths, practice coughing to be sure your lungs are clear. If you have an incision (the cut made at the time of surgery), support your incision when coughing by placing a pillow or rolled up towels firmly against it. Once you are able to get out of bed, walk around indoors and cough well. You may stop using the incentive spirometer when instructed by your caregiver.  RISKS AND COMPLICATIONS  Take your time so you do not get dizzy or light-headed.  If you are in pain, you may need to take or ask for pain medication before doing incentive spirometry. It is harder to take a deep breath if you are having pain. AFTER USE  Rest and breathe slowly and easily.  It can be helpful to keep track of a log of your progress. Your caregiver can provide you with a simple table to help with this. If you are using the spirometer at home, follow these instructions: SEEK MEDICAL CARE IF:   You are having difficultly using the  spirometer.  You have trouble using the spirometer as often as instructed.  Your pain medication is not giving enough relief while using the spirometer.  You develop fever of 100.5 F (38.1 C) or higher. SEEK IMMEDIATE MEDICAL CARE IF:   You cough up bloody sputum that had not been present before.  You develop fever of 102 F (38.9 C) or greater.  You develop worsening pain at or near the incision site. MAKE SURE YOU:   Understand these instructions.  Will watch your condition.  Will get help right away if you are not doing well or get worse. Document Released: 08/18/2006 Document Revised: 06/30/2011 Document Reviewed: 10/19/2006 Exit[employer] Patient Information 2014 [employer], [place].   ________________________________________________________________________  WHAT IS A BLOOD TRANSFUSION? Blood Transfusion Information  A transfusion is the replacement of blood or some of its parts. Blood is made up of multiple cells which provide different functions.  Red blood cells carry oxygen and are used for blood loss replacement.  White blood cells fight against infection.  Platelets control bleeding.  Plasma helps clot blood.  Other blood products are available for specialized needs, such as hemophilia or other clotting disorders. BEFORE THE TRANSFUSION  Who gives blood for transfusions?   Healthy volunteers who are fully evaluated to make sure their blood is safe. This is blood bank blood. Transfusion therapy is the safest it has ever been in the practice of medicine. Before blood is taken from a donor, a complete history is taken to make sure that person has no history of diseases nor engages in risky social behavior (examples are intravenous drug use or sexual activity with multiple partners). The donor's travel history is screened to minimize risk of transmitting infections, such as malaria. The donated blood is tested for signs of infectious diseases, such as HIV and hepatitis.  The blood is then tested to be sure it is compatible with you in order to minimize the chance of a transfusion reaction. If you or a relative donates blood, this is often done in anticipation of surgery and is not appropriate for emergency situations. It takes many days to process the donated blood. RISKS AND COMPLICATIONS Although transfusion therapy is very safe and saves many lives, the main dangers of transfusion include:   Getting an infectious disease.  Developing a transfusion reaction. This is an allergic reaction to something in the blood you were given. Every precaution is taken to prevent this. The decision to have a blood transfusion has been considered carefully by your caregiver before blood is given. Blood is not given unless the benefits outweigh the risks. AFTER THE TRANSFUSION  Right after receiving a blood transfusion, you will usually feel much better and more energetic. This is especially true if your red blood cells have gotten low (anemic). The transfusion raises the level of the red blood cells which carry oxygen, and this usually causes an energy increase.  The nurse administering the transfusion will monitor you carefully for complications. HOME CARE INSTRUCTIONS  No special instructions are needed after a transfusion. You may find your energy is better. Speak with your caregiver about any limitations on activity for underlying diseases you may have. SEEK MEDICAL CARE IF:   Your condition is not improving after your transfusion.  You develop redness or irritation at the intravenous (IV) site. SEEK IMMEDIATE MEDICAL CARE IF:  Any of the following symptoms occur over the next 12 hours:  Shaking chills.  You have a temperature by mouth above 102 F (38.9 C), not controlled by medicine.  Chest, back, or muscle pain.  People around you feel you are not acting correctly or are confused.  Shortness of breath or difficulty breathing.  Dizziness and fainting.  You  get a rash or develop hives.  You have a decrease in urine output.  Your urine turns a dark color or changes to pink, red, or brown. Any of the following symptoms occur over the next 10 days:  You have a temperature by mouth above 102 F (38.9 C), not controlled by medicine.  Shortness of breath.  Weakness after normal activity.  The white part of the eye turns yellow (jaundice).  You have a decrease in the amount of urine or are urinating less often.  Your urine turns a dark color or changes to pink, red, or brown. Document Released: 04/04/2000 Document Revised: 06/30/2011 Document Reviewed: 11/22/2007 [facility] Patient Information 2014 [place], [place].  _______________________________________________________________________

## 2015-03-07 NOTE — Progress Notes (Signed)
Cardiac clearance per epic 03/06/2015 per Dr Murrell Converse ECHO epic 03/06/2015 EKG epic 12/14/2014 CXR epic 11/29/2014

## 2015-03-08 ENCOUNTER — Inpatient Hospital Stay (HOSPITAL_COMMUNITY)
Admission: RE | Admit: 2015-03-08 | Discharge: 2015-03-12 | DRG: 908 | Disposition: A | Discharge status: Skilled Nursing Facility | Payer: Medicare Other | Source: Ambulatory Visit | Attending: Orthopedic Surgery | Admitting: Orthopedic Surgery

## 2015-03-08 ENCOUNTER — Inpatient Hospital Stay (HOSPITAL_COMMUNITY): Payer: Medicare Other

## 2015-03-08 ENCOUNTER — Encounter (HOSPITAL_COMMUNITY): Payer: Self-pay | Admitting: *Deleted

## 2015-03-08 ENCOUNTER — Inpatient Hospital Stay (HOSPITAL_COMMUNITY): Payer: Medicare Other | Admitting: Anesthesiology

## 2015-03-08 ENCOUNTER — Encounter (HOSPITAL_COMMUNITY)
Admission: RE | Disposition: A | Discharge status: Skilled Nursing Facility | Payer: Self-pay | Source: Ambulatory Visit | Attending: Orthopedic Surgery

## 2015-03-08 DIAGNOSIS — N189 Chronic kidney disease, unspecified: Secondary | ICD-10-CM | POA: Diagnosis not present

## 2015-03-08 DIAGNOSIS — I5032 Chronic diastolic (congestive) heart failure: Secondary | ICD-10-CM | POA: Diagnosis not present

## 2015-03-08 DIAGNOSIS — G4730 Sleep apnea, unspecified: Secondary | ICD-10-CM | POA: Diagnosis present

## 2015-03-08 DIAGNOSIS — S76191A Other specified injury of right quadriceps muscle, fascia and tendon, initial encounter: Secondary | ICD-10-CM | POA: Diagnosis not present

## 2015-03-08 DIAGNOSIS — Z950 Presence of cardiac pacemaker: Secondary | ICD-10-CM

## 2015-03-08 DIAGNOSIS — S83094A Other dislocation of right patella, initial encounter: Secondary | ICD-10-CM | POA: Diagnosis not present

## 2015-03-08 DIAGNOSIS — I4891 Unspecified atrial fibrillation: Secondary | ICD-10-CM | POA: Diagnosis not present

## 2015-03-08 DIAGNOSIS — M6281 Muscle weakness (generalized): Secondary | ICD-10-CM | POA: Diagnosis not present

## 2015-03-08 DIAGNOSIS — K219 Gastro-esophageal reflux disease without esophagitis: Secondary | ICD-10-CM | POA: Diagnosis present

## 2015-03-08 DIAGNOSIS — Z8673 Personal history of transient ischemic attack (TIA), and cerebral infarction without residual deficits: Secondary | ICD-10-CM

## 2015-03-08 DIAGNOSIS — I5033 Acute on chronic diastolic (congestive) heart failure: Secondary | ICD-10-CM | POA: Diagnosis present

## 2015-03-08 DIAGNOSIS — Z9181 History of falling: Secondary | ICD-10-CM | POA: Diagnosis not present

## 2015-03-08 DIAGNOSIS — E785 Hyperlipidemia, unspecified: Secondary | ICD-10-CM | POA: Diagnosis present

## 2015-03-08 DIAGNOSIS — Z419 Encounter for procedure for purposes other than remedying health state, unspecified: Secondary | ICD-10-CM

## 2015-03-08 DIAGNOSIS — Z86711 Personal history of pulmonary embolism: Secondary | ICD-10-CM | POA: Diagnosis not present

## 2015-03-08 DIAGNOSIS — R1312 Dysphagia, oropharyngeal phase: Secondary | ICD-10-CM | POA: Diagnosis not present

## 2015-03-08 DIAGNOSIS — Z7901 Long term (current) use of anticoagulants: Secondary | ICD-10-CM | POA: Diagnosis not present

## 2015-03-08 DIAGNOSIS — R278 Other lack of coordination: Secondary | ICD-10-CM | POA: Diagnosis not present

## 2015-03-08 DIAGNOSIS — I5042 Chronic combined systolic (congestive) and diastolic (congestive) heart failure: Secondary | ICD-10-CM | POA: Diagnosis present

## 2015-03-08 DIAGNOSIS — N183 Chronic kidney disease, stage 3 unspecified: Secondary | ICD-10-CM | POA: Diagnosis present

## 2015-03-08 DIAGNOSIS — S86891A Other injury of other muscle(s) and tendon(s) at lower leg level, right leg, initial encounter: Secondary | ICD-10-CM | POA: Diagnosis present

## 2015-03-08 DIAGNOSIS — Z885 Allergy status to narcotic agent status: Secondary | ICD-10-CM | POA: Diagnosis not present

## 2015-03-08 DIAGNOSIS — S86811A Strain of other muscle(s) and tendon(s) at lower leg level, right leg, initial encounter: Secondary | ICD-10-CM | POA: Diagnosis not present

## 2015-03-08 DIAGNOSIS — I130 Hypertensive heart and chronic kidney disease with heart failure and stage 1 through stage 4 chronic kidney disease, or unspecified chronic kidney disease: Secondary | ICD-10-CM | POA: Diagnosis not present

## 2015-03-08 DIAGNOSIS — Z01810 Encounter for preprocedural cardiovascular examination: Secondary | ICD-10-CM

## 2015-03-08 DIAGNOSIS — R41841 Cognitive communication deficit: Secondary | ICD-10-CM | POA: Diagnosis not present

## 2015-03-08 DIAGNOSIS — Z96651 Presence of right artificial knee joint: Secondary | ICD-10-CM | POA: Diagnosis not present

## 2015-03-08 DIAGNOSIS — Z79899 Other long term (current) drug therapy: Secondary | ICD-10-CM | POA: Diagnosis not present

## 2015-03-08 DIAGNOSIS — J45909 Unspecified asthma, uncomplicated: Secondary | ICD-10-CM | POA: Diagnosis not present

## 2015-03-08 DIAGNOSIS — M25561 Pain in right knee: Secondary | ICD-10-CM | POA: Diagnosis present

## 2015-03-08 DIAGNOSIS — R2681 Unsteadiness on feet: Secondary | ICD-10-CM | POA: Diagnosis not present

## 2015-03-08 DIAGNOSIS — Z79891 Long term (current) use of opiate analgesic: Secondary | ICD-10-CM

## 2015-03-08 DIAGNOSIS — Z9981 Dependence on supplemental oxygen: Secondary | ICD-10-CM

## 2015-03-08 DIAGNOSIS — I480 Paroxysmal atrial fibrillation: Secondary | ICD-10-CM | POA: Diagnosis not present

## 2015-03-08 DIAGNOSIS — Z9889 Other specified postprocedural states: Secondary | ICD-10-CM | POA: Diagnosis not present

## 2015-03-08 DIAGNOSIS — S86891D Other injury of other muscle(s) and tendon(s) at lower leg level, right leg, subsequent encounter: Secondary | ICD-10-CM

## 2015-03-08 DIAGNOSIS — Z01812 Encounter for preprocedural laboratory examination: Secondary | ICD-10-CM

## 2015-03-08 DIAGNOSIS — D638 Anemia in other chronic diseases classified elsewhere: Secondary | ICD-10-CM | POA: Diagnosis not present

## 2015-03-08 DIAGNOSIS — W19XXXA Unspecified fall, initial encounter: Secondary | ICD-10-CM | POA: Diagnosis present

## 2015-03-08 DIAGNOSIS — E039 Hypothyroidism, unspecified: Secondary | ICD-10-CM | POA: Diagnosis not present

## 2015-03-08 DIAGNOSIS — I509 Heart failure, unspecified: Secondary | ICD-10-CM | POA: Diagnosis not present

## 2015-03-08 DIAGNOSIS — Z952 Presence of prosthetic heart valve: Secondary | ICD-10-CM

## 2015-03-08 DIAGNOSIS — I129 Hypertensive chronic kidney disease with stage 1 through stage 4 chronic kidney disease, or unspecified chronic kidney disease: Secondary | ICD-10-CM | POA: Diagnosis not present

## 2015-03-08 DIAGNOSIS — Z4789 Encounter for other orthopedic aftercare: Secondary | ICD-10-CM | POA: Diagnosis not present

## 2015-03-08 HISTORY — PX: ORIF PATELLA: SHX5033

## 2015-03-08 LAB — CBC
HEMATOCRIT: 31.9 % — AB (ref 36.0–46.0)
HEMOGLOBIN: 10.4 g/dL — AB (ref 12.0–15.0)
MCH: 30.1 pg (ref 26.0–34.0)
MCHC: 32.6 g/dL (ref 30.0–36.0)
MCV: 92.2 fL (ref 78.0–100.0)
Platelets: 205 10*3/uL (ref 150–400)
RBC: 3.46 MIL/uL — AB (ref 3.87–5.11)
RDW: 14.7 % (ref 11.5–15.5)
WBC: 8.4 10*3/uL (ref 4.0–10.5)

## 2015-03-08 LAB — PROTIME-INR
INR: 1.83 — AB (ref 0.00–1.49)
Prothrombin Time: 21.1 seconds — ABNORMAL HIGH (ref 11.6–15.2)

## 2015-03-08 LAB — TYPE AND SCREEN
ABO/RH(D): O POS
Antibody Screen: NEGATIVE

## 2015-03-08 LAB — CREATININE, SERUM
Creatinine, Ser: 1.94 mg/dL — ABNORMAL HIGH (ref 0.44–1.00)
GFR, EST AFRICAN AMERICAN: 27 mL/min — AB (ref >60)
GFR, EST NON AFRICAN AMERICAN: 23 mL/min — AB (ref >60)

## 2015-03-08 SURGERY — OPEN REDUCTION INTERNAL FIXATION (ORIF) PATELLA
Anesthesia: General | Site: Knee | Laterality: Right

## 2015-03-08 MED ORDER — ENOXAPARIN SODIUM 30 MG/0.3ML ~~LOC~~ SOLN
30.0000 mg | SUBCUTANEOUS | Status: DC
  Administered 2015-03-09 – 2015-03-12 (×4): 30 mg via SUBCUTANEOUS
  Filled 2015-03-08 (×5): qty 0.3

## 2015-03-08 MED ORDER — POLYETHYLENE GLYCOL 3350 17 G PO PACK
17.0000 g | PACK | Freq: Two times a day (BID) | ORAL | Status: DC
  Administered 2015-03-08 – 2015-03-10 (×4): 17 g via ORAL

## 2015-03-08 MED ORDER — ACETAMINOPHEN 10 MG/ML IV SOLN: 1000.0000 mg | Freq: Once | INTRAVENOUS | Status: DC

## 2015-03-08 MED ORDER — FENTANYL CITRATE (PF) 100 MCG/2ML IJ SOLN
INTRAMUSCULAR | Status: AC
  Filled 2015-03-08: qty 2

## 2015-03-08 MED ORDER — FEBUXOSTAT 40 MG PO TABS
40.0000 mg | ORAL_TABLET | Freq: Every evening | ORAL | Status: DC
  Administered 2015-03-08 – 2015-03-11 (×4): 40 mg via ORAL
  Filled 2015-03-08 (×5): qty 1

## 2015-03-08 MED ORDER — DEXAMETHASONE SODIUM PHOSPHATE 10 MG/ML IJ SOLN
INTRAMUSCULAR | Status: AC
  Filled 2015-03-08: qty 1

## 2015-03-08 MED ORDER — PROPOFOL 10 MG/ML IV BOLUS
INTRAVENOUS | Status: DC | PRN
  Administered 2015-03-08: 120 mg via INTRAVENOUS

## 2015-03-08 MED ORDER — LIDOCAINE HCL (CARDIAC) 20 MG/ML IV SOLN
INTRAVENOUS | Status: AC
  Filled 2015-03-08: qty 5

## 2015-03-08 MED ORDER — LINACLOTIDE 145 MCG PO CAPS
145.0000 ug | ORAL_CAPSULE | ORAL | Status: DC
  Administered 2015-03-09 – 2015-03-11 (×2): 145 ug via ORAL
  Filled 2015-03-08 (×2): qty 1

## 2015-03-08 MED ORDER — DONEPEZIL HCL 10 MG PO TABS
10.0000 mg | ORAL_TABLET | Freq: Every day | ORAL | Status: DC
  Administered 2015-03-08 – 2015-03-11 (×4): 10 mg via ORAL
  Filled 2015-03-08 (×5): qty 1

## 2015-03-08 MED ORDER — LINACLOTIDE 290 MCG PO CAPS
290.0000 ug | ORAL_CAPSULE | ORAL | Status: DC
  Administered 2015-03-08 – 2015-03-12 (×3): 290 ug via ORAL
  Filled 2015-03-08 (×3): qty 1

## 2015-03-08 MED ORDER — ONDANSETRON HCL 4 MG PO TABS: 4.0000 mg | ORAL_TABLET | Freq: Four times a day (QID) | ORAL | Status: DC | PRN

## 2015-03-08 MED ORDER — OMEPRAZOLE 20 MG PO CPDR
20.0000 mg | DELAYED_RELEASE_CAPSULE | Freq: Every day | ORAL | Status: DC
  Administered 2015-03-09 – 2015-03-12 (×4): 20 mg via ORAL
  Filled 2015-03-08 (×4): qty 1

## 2015-03-08 MED ORDER — PROMETHAZINE HCL 25 MG/ML IJ SOLN: 6.2500 mg | INTRAMUSCULAR | Status: DC | PRN

## 2015-03-08 MED ORDER — METOCLOPRAMIDE HCL 5 MG/ML IJ SOLN: 5.0000 mg | Freq: Three times a day (TID) | INTRAMUSCULAR | Status: DC | PRN

## 2015-03-08 MED ORDER — ALENDRONATE SODIUM 70 MG PO TABS: 70.0000 mg | ORAL_TABLET | ORAL | Status: DC

## 2015-03-08 MED ORDER — MAGNESIUM CITRATE PO SOLN: 1.0000 | Freq: Once | ORAL | Status: DC | PRN

## 2015-03-08 MED ORDER — METHOCARBAMOL 1000 MG/10ML IJ SOLN
500.0000 mg | Freq: Four times a day (QID) | INTRAMUSCULAR | Status: DC | PRN
  Administered 2015-03-08: 500 mg via INTRAVENOUS
  Filled 2015-03-08 (×2): qty 5

## 2015-03-08 MED ORDER — DOCUSATE SODIUM 100 MG PO CAPS
100.0000 mg | ORAL_CAPSULE | Freq: Two times a day (BID) | ORAL | Status: DC
  Administered 2015-03-08 – 2015-03-12 (×7): 100 mg via ORAL

## 2015-03-08 MED ORDER — LEVOTHYROXINE SODIUM 100 MCG PO TABS
100.0000 ug | ORAL_TABLET | Freq: Every day | ORAL | Status: DC
  Administered 2015-03-09 – 2015-03-12 (×4): 100 ug via ORAL
  Filled 2015-03-08 (×5): qty 1

## 2015-03-08 MED ORDER — TORSEMIDE 20 MG PO TABS
20.0000 mg | ORAL_TABLET | Freq: Two times a day (BID) | ORAL | Status: DC
  Administered 2015-03-09 – 2015-03-11 (×5): 20 mg via ORAL
  Filled 2015-03-08 (×7): qty 1

## 2015-03-08 MED ORDER — FERROUS SULFATE 325 (65 FE) MG PO TABS
325.0000 mg | ORAL_TABLET | Freq: Three times a day (TID) | ORAL | Status: DC
  Administered 2015-03-09 – 2015-03-12 (×10): 325 mg via ORAL
  Filled 2015-03-08 (×14): qty 1

## 2015-03-08 MED ORDER — DEXAMETHASONE SODIUM PHOSPHATE 10 MG/ML IJ SOLN
INTRAMUSCULAR | Status: DC | PRN
  Administered 2015-03-08: 10 mg via INTRAVENOUS

## 2015-03-08 MED ORDER — NITROGLYCERIN 0.4 MG SL SUBL: 0.4000 mg | SUBLINGUAL_TABLET | SUBLINGUAL | Status: DC | PRN

## 2015-03-08 MED ORDER — ONDANSETRON HCL 4 MG/2ML IJ SOLN
INTRAMUSCULAR | Status: AC
  Filled 2015-03-08: qty 2

## 2015-03-08 MED ORDER — CHLORHEXIDINE GLUCONATE 4 % EX LIQD
60.0000 mL | Freq: Once | CUTANEOUS | Status: DC
Start: 2015-03-08 — End: 2015-03-08

## 2015-03-08 MED ORDER — CEFAZOLIN SODIUM-DEXTROSE 2-3 GM-% IV SOLR
2.0000 g | Freq: Four times a day (QID) | INTRAVENOUS | Status: AC
  Administered 2015-03-10 (×2): 2 g via INTRAVENOUS
  Filled 2015-03-08 (×2): qty 50

## 2015-03-08 MED ORDER — CALCITRIOL 0.25 MCG PO CAPS
0.2500 ug | ORAL_CAPSULE | Freq: Every morning | ORAL | Status: DC
  Administered 2015-03-09 – 2015-03-12 (×4): 0.25 ug via ORAL
  Filled 2015-03-08 (×4): qty 1

## 2015-03-08 MED ORDER — HYDRALAZINE HCL 10 MG PO TABS
10.0000 mg | ORAL_TABLET | Freq: Three times a day (TID) | ORAL | Status: DC
  Administered 2015-03-08 – 2015-03-12 (×9): 10 mg via ORAL
  Filled 2015-03-08 (×13): qty 1

## 2015-03-08 MED ORDER — LACTATED RINGERS IV SOLN
INTRAVENOUS | Status: DC
  Administered 2015-03-08: 16:00:00 via INTRAVENOUS

## 2015-03-08 MED ORDER — PROPOFOL 10 MG/ML IV BOLUS
INTRAVENOUS | Status: AC
  Filled 2015-03-08: qty 20

## 2015-03-08 MED ORDER — HYDROMORPHONE HCL 1 MG/ML IJ SOLN
INTRAMUSCULAR | Status: AC
  Filled 2015-03-08: qty 1

## 2015-03-08 MED ORDER — PHENOL 1.4 % MT LIQD: 1.0000 | OROMUCOSAL | Status: DC | PRN

## 2015-03-08 MED ORDER — SODIUM CHLORIDE 0.9 % IV SOLN
INTRAVENOUS | Status: DC
  Administered 2015-03-10: via INTRAVENOUS
  Filled 2015-03-08 (×6): qty 1000

## 2015-03-08 MED ORDER — DEXAMETHASONE SODIUM PHOSPHATE 10 MG/ML IJ SOLN
10.0000 mg | Freq: Once | INTRAMUSCULAR | Status: AC
  Administered 2015-03-09: 10 mg via INTRAVENOUS
  Filled 2015-03-08: qty 1

## 2015-03-08 MED ORDER — 0.9 % SODIUM CHLORIDE (POUR BTL) OPTIME
TOPICAL | Status: DC | PRN
  Administered 2015-03-08: 1000 mL

## 2015-03-08 MED ORDER — WARFARIN - PHARMACIST DOSING INPATIENT: Freq: Every day | Status: DC

## 2015-03-08 MED ORDER — MENTHOL 3 MG MT LOZG: 1.0000 | LOZENGE | OROMUCOSAL | Status: DC | PRN

## 2015-03-08 MED ORDER — KETOROLAC TROMETHAMINE 15 MG/ML IJ SOLN
INTRAMUSCULAR | Status: AC
  Filled 2015-03-08: qty 1

## 2015-03-08 MED ORDER — METHOCARBAMOL 500 MG PO TABS
500.0000 mg | ORAL_TABLET | Freq: Four times a day (QID) | ORAL | Status: DC | PRN
  Administered 2015-03-09 – 2015-03-11 (×2): 500 mg via ORAL
  Filled 2015-03-08 (×2): qty 1

## 2015-03-08 MED ORDER — CELECOXIB 200 MG PO CAPS
200.0000 mg | ORAL_CAPSULE | Freq: Two times a day (BID) | ORAL | Status: DC
  Administered 2015-03-08 – 2015-03-10 (×4): 200 mg via ORAL
  Filled 2015-03-08 (×5): qty 1

## 2015-03-08 MED ORDER — HYDROMORPHONE HCL 1 MG/ML IJ SOLN
0.5000 mg | INTRAMUSCULAR | Status: DC | PRN
  Administered 2015-03-09: 0.5 mg via INTRAVENOUS
  Filled 2015-03-08: qty 1

## 2015-03-08 MED ORDER — AMIODARONE HCL 100 MG PO TABS
100.0000 mg | ORAL_TABLET | Freq: Every day | ORAL | Status: DC
  Administered 2015-03-09 – 2015-03-12 (×4): 100 mg via ORAL
  Filled 2015-03-08 (×4): qty 1

## 2015-03-08 MED ORDER — ONDANSETRON HCL 4 MG/2ML IJ SOLN
INTRAMUSCULAR | Status: DC | PRN
  Administered 2015-03-08: 4 mg via INTRAVENOUS

## 2015-03-08 MED ORDER — METOCLOPRAMIDE HCL 10 MG PO TABS: 5.0000 mg | ORAL_TABLET | Freq: Three times a day (TID) | ORAL | Status: DC | PRN

## 2015-03-08 MED ORDER — ALUM & MAG HYDROXIDE-SIMETH 200-200-20 MG/5ML PO SUSP: 30.0000 mL | ORAL | Status: DC | PRN

## 2015-03-08 MED ORDER — DIPHENHYDRAMINE HCL 25 MG PO CAPS: 25.0000 mg | ORAL_CAPSULE | Freq: Four times a day (QID) | ORAL | Status: DC | PRN

## 2015-03-08 MED ORDER — LIDOCAINE HCL (CARDIAC) 20 MG/ML IV SOLN
INTRAVENOUS | Status: DC | PRN
  Administered 2015-03-08: 50 mg via INTRAVENOUS

## 2015-03-08 MED ORDER — DULOXETINE HCL 60 MG PO CPEP
60.0000 mg | ORAL_CAPSULE | Freq: Every evening | ORAL | Status: DC
  Administered 2015-03-08 – 2015-03-09 (×2): 60 mg via ORAL
  Filled 2015-03-08 (×3): qty 1

## 2015-03-08 MED ORDER — IPRATROPIUM-ALBUTEROL 0.5-2.5 (3) MG/3ML IN SOLN: 3.0000 mL | Freq: Every day | RESPIRATORY_TRACT | Status: DC | PRN

## 2015-03-08 MED ORDER — CEFAZOLIN SODIUM-DEXTROSE 2-3 GM-% IV SOLR
2.0000 g | INTRAVENOUS | Status: AC
  Administered 2015-03-08: 2 g via INTRAVENOUS

## 2015-03-08 MED ORDER — HYDROMORPHONE HCL 1 MG/ML IJ SOLN
0.2500 mg | INTRAMUSCULAR | Status: DC | PRN
  Administered 2015-03-08 (×4): 0.25 mg via INTRAVENOUS
  Administered 2015-03-08 (×2): 0.5 mg via INTRAVENOUS

## 2015-03-08 MED ORDER — LORATADINE 10 MG PO TABS
10.0000 mg | ORAL_TABLET | Freq: Every day | ORAL | Status: DC
  Administered 2015-03-08 – 2015-03-10 (×3): 10 mg via ORAL
  Filled 2015-03-08 (×3): qty 1

## 2015-03-08 MED ORDER — COLCHICINE 0.6 MG PO TABS
0.3000 mg | ORAL_TABLET | ORAL | Status: DC
  Administered 2015-03-09 – 2015-03-11 (×2): 0.3 mg via ORAL
  Filled 2015-03-08 (×2): qty 0.5

## 2015-03-08 MED ORDER — CARVEDILOL 12.5 MG PO TABS
12.5000 mg | ORAL_TABLET | Freq: Two times a day (BID) | ORAL | Status: DC
  Administered 2015-03-08 – 2015-03-12 (×8): 12.5 mg via ORAL
  Filled 2015-03-08 (×9): qty 1

## 2015-03-08 MED ORDER — HYDROCODONE-ACETAMINOPHEN 7.5-325 MG PO TABS
1.0000 | ORAL_TABLET | ORAL | Status: DC
  Administered 2015-03-08 – 2015-03-10 (×7): 2 via ORAL
  Administered 2015-03-10 (×3): 1 via ORAL
  Administered 2015-03-10: 2 via ORAL
  Administered 2015-03-10 – 2015-03-12 (×9): 1 via ORAL
  Filled 2015-03-08: qty 1
  Filled 2015-03-08 (×2): qty 2
  Filled 2015-03-08 (×4): qty 1
  Filled 2015-03-08: qty 2
  Filled 2015-03-08: qty 1
  Filled 2015-03-08: qty 2
  Filled 2015-03-08: qty 1
  Filled 2015-03-08 (×3): qty 2
  Filled 2015-03-08 (×3): qty 1
  Filled 2015-03-08: qty 2
  Filled 2015-03-08 (×3): qty 1

## 2015-03-08 MED ORDER — KETOROLAC TROMETHAMINE 15 MG/ML IJ SOLN
15.0000 mg | Freq: Once | INTRAMUSCULAR | Status: AC
  Administered 2015-03-08: 15 mg via INTRAVENOUS

## 2015-03-08 MED ORDER — FENTANYL CITRATE (PF) 100 MCG/2ML IJ SOLN
INTRAMUSCULAR | Status: DC | PRN
  Administered 2015-03-08 (×4): 50 ug via INTRAVENOUS

## 2015-03-08 MED ORDER — FENTANYL CITRATE (PF) 100 MCG/2ML IJ SOLN
25.0000 ug | INTRAMUSCULAR | Status: DC | PRN
  Administered 2015-03-08: 50 ug via INTRAVENOUS

## 2015-03-08 MED ORDER — NON FORMULARY: 20.0000 mg | Freq: Every day | Status: DC

## 2015-03-08 MED ORDER — ACETAMINOPHEN 10 MG/ML IV SOLN
INTRAVENOUS | Status: AC
  Administered 2015-03-08: 1000 mg
  Filled 2015-03-08: qty 100

## 2015-03-08 MED ORDER — CEFAZOLIN SODIUM-DEXTROSE 2-3 GM-% IV SOLR
INTRAVENOUS | Status: AC
  Filled 2015-03-08: qty 50

## 2015-03-08 MED ORDER — ONDANSETRON HCL 4 MG/2ML IJ SOLN: 4.0000 mg | Freq: Four times a day (QID) | INTRAMUSCULAR | Status: DC | PRN

## 2015-03-08 MED ORDER — PREGABALIN 75 MG PO CAPS
75.0000 mg | ORAL_CAPSULE | Freq: Two times a day (BID) | ORAL | Status: DC
  Administered 2015-03-08 – 2015-03-10 (×4): 75 mg via ORAL
  Filled 2015-03-08 (×4): qty 1

## 2015-03-08 MED ORDER — MEMANTINE HCL 10 MG PO TABS
10.0000 mg | ORAL_TABLET | Freq: Two times a day (BID) | ORAL | Status: DC
  Administered 2015-03-08 – 2015-03-10 (×4): 10 mg via ORAL
  Filled 2015-03-08 (×5): qty 1

## 2015-03-08 MED ORDER — BISACODYL 10 MG RE SUPP: 10.0000 mg | Freq: Every day | RECTAL | Status: DC | PRN

## 2015-03-08 MED ORDER — WARFARIN SODIUM 1 MG PO TABS
1.0000 mg | ORAL_TABLET | Freq: Once | ORAL | Status: AC
  Administered 2015-03-08: 1 mg via ORAL
  Filled 2015-03-08: qty 1

## 2015-03-08 MED ORDER — PRAVASTATIN SODIUM 40 MG PO TABS
40.0000 mg | ORAL_TABLET | Freq: Every evening | ORAL | Status: DC
  Administered 2015-03-08 – 2015-03-11 (×4): 40 mg via ORAL
  Filled 2015-03-08 (×5): qty 1

## 2015-03-08 SURGICAL SUPPLY — 52 items
BAG SPEC THK2 15X12 ZIP CLS (MISCELLANEOUS) ×1
BAG ZIPLOCK 12X15 (MISCELLANEOUS) ×3 IMPLANT
BANDAGE ELASTIC 6 VELCRO ST LF (GAUZE/BANDAGES/DRESSINGS) ×2 IMPLANT
BNDG COHESIVE 6X5 TAN NS LF (GAUZE/BANDAGES/DRESSINGS) ×3 IMPLANT
CLOTH BEACON ORANGE TIMEOUT ST (SAFETY) ×3 IMPLANT
CUFF TOURN SGL QUICK 34 (TOURNIQUET CUFF) ×3
CUFF TOURN SGL QUICK 44 (TOURNIQUET CUFF) IMPLANT
CUFF TRNQT CYL 34X4X40X1 (TOURNIQUET CUFF) IMPLANT
DECANTER SPIKE VIAL GLASS SM (MISCELLANEOUS) ×1 IMPLANT
DRAPE C-ARM 42X120 X-RAY (DRAPES) ×2 IMPLANT
DRAPE C-ARMOR (DRAPES) ×2 IMPLANT
DRAPE U-SHAPE 47X51 STRL (DRAPES) ×5 IMPLANT
DRSG AQUACEL AG ADV 3.5X10 (GAUZE/BANDAGES/DRESSINGS) ×3 IMPLANT
DURAPREP 26ML APPLICATOR (WOUND CARE) ×3 IMPLANT
ELECT REM PT RETURN 9FT ADLT (ELECTROSURGICAL) ×3
ELECTRODE REM PT RTRN 9FT ADLT (ELECTROSURGICAL) ×1 IMPLANT
FACESHIELD WRAPAROUND (MASK) ×3 IMPLANT
FACESHIELD WRAPAROUND OR TEAM (MASK) IMPLANT
GLOVE BIOGEL M 7.0 STRL (GLOVE) IMPLANT
GLOVE BIOGEL M STRL SZ7.5 (GLOVE) IMPLANT
GLOVE BIOGEL PI IND STRL 7.5 (GLOVE) ×1 IMPLANT
GLOVE BIOGEL PI IND STRL 8.5 (GLOVE) ×1 IMPLANT
GLOVE BIOGEL PI INDICATOR 7.5 (GLOVE) ×2
GLOVE BIOGEL PI INDICATOR 8.5 (GLOVE) ×2
GLOVE ECLIPSE 8.0 STRL XLNG CF (GLOVE) IMPLANT
GLOVE ORTHO TXT STRL SZ7.5 (GLOVE) ×6 IMPLANT
GLOVE SURG ORTHO 8.0 STRL STRW (GLOVE) ×3 IMPLANT
GOWN STRL REUS W/TWL LRG LVL3 (GOWN DISPOSABLE) ×3 IMPLANT
GOWN STRL REUS W/TWL XL LVL3 (GOWN DISPOSABLE) ×6 IMPLANT
IMMOBILIZER KNEE 20 (SOFTGOODS) ×3
IMMOBILIZER KNEE 20 THIGH 36 (SOFTGOODS) IMPLANT
LIQUID BAND (GAUZE/BANDAGES/DRESSINGS) ×4 IMPLANT
MANIFOLD NEPTUNE II (INSTRUMENTS) ×3 IMPLANT
NS IRRIG 1000ML POUR BTL (IV SOLUTION) ×3 IMPLANT
PACK TOTAL KNEE CUSTOM (KITS) ×3 IMPLANT
PASSER SUT SWANSON 36MM LOOP (INSTRUMENTS) ×3 IMPLANT
PEN SKIN MARKING BROAD (MISCELLANEOUS) ×2 IMPLANT
POSITIONER SURGICAL ARM (MISCELLANEOUS) ×3 IMPLANT
STAPLER VISISTAT (STAPLE) ×3 IMPLANT
SUT ETHIBOND NAB CT1 #1 30IN (SUTURE) ×2 IMPLANT
SUT FIBERWIRE #2 38 T-5 BLUE (SUTURE) ×6
SUT MNCRL AB 4-0 PS2 18 (SUTURE) ×2 IMPLANT
SUT VIC AB 0 CT1 27 (SUTURE)
SUT VIC AB 0 CT1 27XBRD ANTBC (SUTURE) ×2 IMPLANT
SUT VIC AB 1 CT1 27 (SUTURE) ×6
SUT VIC AB 1 CT1 27XBRD ANTBC (SUTURE) ×2 IMPLANT
SUT VIC AB 2-0 CT1 27 (SUTURE) ×6
SUT VIC AB 2-0 CT1 TAPERPNT 27 (SUTURE) ×2 IMPLANT
SUT WIRE 16GA (Orthopedic Implant) ×3 IMPLANT
SUTURE FIBERWR #2 38 T-5 BLUE (SUTURE) ×2 IMPLANT
WATER STERILE IRR 1500ML POUR (IV SOLUTION) ×1 IMPLANT
WRAP KNEE MAXI GEL POST OP (GAUZE/BANDAGES/DRESSINGS) ×2 IMPLANT

## 2015-03-08 NOTE — Brief Op Note (Signed)
03/08/2015  6:46 PM  PATIENT:  [name]  81 y.o. female  PRE-OPERATIVE DIAGNOSIS:  RIGHT PATELLA TENDON AVULSION status post right total knee replacement  POST-OPERATIVE DIAGNOSIS:  RIGHT PATELLA TENDON AVULSION status post right total knee replacement  PROCEDURE:  Procedure(s):  OPEN REDUCTION INTERNAL FIXATION RIGHT  PATELLA TENDON AVULSION (Right)  SURGEON:  Surgeon(s) and Role:    * Durene Romans, MD - Primary  PHYSICIAN ASSISTANT: Lanney Gins, PA-C  ANESTHESIA:   general  EBL:   <50cc  BLOOD ADMINISTERED:none  DRAINS: none   LOCAL MEDICATIONS USED:  NONE  SPECIMEN:  No Specimen  DISPOSITION OF SPECIMEN:  N/A  COUNTS:  YES  TOURNIQUET:  60 min at  DICTATION: .Other Dictation: Dictation Number [id number]  PLAN OF CARE: Admit to inpatient   PATIENT DISPOSITION:  PACU - hemodynamically stable.   Delay start of Pharmacological VTE agent (>24hrs) due to surgical blood loss or risk of bleeding: no

## 2015-03-08 NOTE — Anesthesia Procedure Notes (Signed)
Procedure Name: LMA Insertion Date/Time: 03/08/2015 5:23 PM Performed by: Enriqueta Shutter D Pre-anesthesia Checklist: Patient identified, Emergency Drugs available, Suction available and Patient being monitored Patient Re-evaluated:Patient Re-evaluated prior to inductionOxygen Delivery Method: Circle System Utilized Preoxygenation: Pre-oxygenation with 100% oxygen Intubation Type: IV induction Ventilation: Mask ventilation without difficulty LMA Size: 4.0 Tube type: Oral Number of attempts: 1 Airway Equipment and Method: Stylet and Oral airway Placement Confirmation: positive ETCO2 and breath sounds checked- equal and bilateral Tube secured with: Tape Dental Injury: Teeth and Oropharynx as per pre-operative assessment

## 2015-03-08 NOTE — Interval H&P Note (Signed)
History and Physical Interval Note:  03/08/2015 5:10 PM  [name]  has presented today for surgery, with the diagnosis of RIGHT PATELLA TENDON AVULSION  The various methods of treatment have been discussed with the patient and family. After consideration of risks, benefits and other options for treatment, the patient has consented to  Procedure(s): RIGHT  PATELLA TENDON OPEN REDUCTION INTERNAL (ORIF) FIXATION (Right) as a surgical intervention .  The patient's history has been reviewed, patient examined, no change in status, stable for surgery.  I have reviewed the patient's chart and labs.  Questions were answered to the patient's satisfaction.     [name]

## 2015-03-08 NOTE — Transfer of Care (Signed)
Immediate Anesthesia Transfer of Care Note  Patient: [name]  Procedure(s) Performed: Procedure(s):  OPEN REDUCTION INTERNAL FIXATION RIGHT  PATELLA TENDON AVULSION (Right)  Patient Location: PACU  Anesthesia Type:General  Level of Consciousness:  sedated, patient cooperative and responds to stimulation  Airway & Oxygen Therapy:Patient Spontanous Breathing and Patient connected to face mask oxgen  Post-op Assessment:  Report given to PACU RN and Post -op Vital signs reviewed and stable  Post vital signs:  Reviewed and stable  Last Vitals:  Filed Vitals:   03/08/15 1400  BP: 153/72  Pulse: 67  Temp: 36.8 C  Resp: 12    Complications: No apparent anesthesia complications

## 2015-03-08 NOTE — Anesthesia Postprocedure Evaluation (Signed)
  Anesthesia Post-op Note  Patient: [name]  Procedure(s) Performed: Procedure(s):  OPEN REDUCTION INTERNAL FIXATION RIGHT  PATELLA TENDON AVULSION (Right)  Patient Location: PACU  Anesthesia Type:General  Level of Consciousness: awake  Airway and Oxygen Therapy: Patient Spontanous Breathing  Post-op Pain: mild  Post-op Assessment: Post-op Vital signs reviewed              Post-op Vital Signs: Reviewed  Last Vitals:  Filed Vitals:   03/08/15 1910  BP: 167/78  Pulse: 63  Temp: 36.7 C  Resp: 14    Complications: Patient re-intubated

## 2015-03-08 NOTE — H&P (Signed)
[name] is an 81 y.o. female.    Chief Complaint:  Rupture of the right patella tendon   Procedure:   ORIF right patella tendon  HPI: Pt is a 81 y.o. female who had a right TKA on 12/04/2014.  Upon subsequent visit to the office she was having difficulty with the right knee. Pain had continually increased since the beginning. She has experienced some falls since the knee replacement and returned to the clinic with issues extending her knee. X-rays in the clinic show a high riding patella indicating a patella tendon rupture.  Various options are discussed with the patient. Risks, benefits and expectations were discussed with the patient. Patient understand the risks, benefits and expectations and wishes to proceed with surgery.   PCP: Jani Gravel, MD  D/C Plans: Home with HHPT/SNF  Post-op Meds: No Rx given  Tranexamic Acid: To be given - topically (previous PE)  Decadron: Is to be given  FYI: Coumadin and Lovenox Oxycodone post-op   PMH: Past Medical History  Diagnosis Date  . Syncope   . Symptomatic bradycardia   . Chronic lower back pain   . Nonischemic cardiomyopathy ([place])   . HTN (hypertension)   . HLD (hyperlipidemia)   . Atrial fibrillation (HCC)     tachy-brady syndrome with <1% recurrent PAF since pacemaker placement  . H/O: stroke   . Pulmonary embolism (HCC)     HISTORY OF, the pt. had a recurrent bilateral pulmonary emboli in 2005, on warfarin therapy and at which time she under went implantation of IVC filter  . Anemia   . Dementia   . Gastroesophageal reflux disease   . Aortic stenosis 10/13/2012    Low EF, low gradient with severe aortic stenosis confirmed by dobutamine stress echocardiogram s/p TAVR 12/2012  . PONV (postoperative nausea and vomiting)   . Osteoarthritis   . Neuropathy ([place])   . Spinal stenosis   . Symptomatic bradycardia 2012    s/p Medtronic PPM  . Chronic combined systolic and diastolic CHF  (congestive heart failure) ([place])   . Fibromyalgia   . Presence of permanent cardiac pacemaker   . Sleep apnea     uses oxygen at night and PRN- not used since > 6 months   . Shortness of breath dyspnea     with exertion   . Pneumonia     hx of x 3   . Acute on chronic renal failure [facility])     sees Dr Posey Pronto   . CKD (chronic kidney disease)   . Headache     hx of migraines   . On home oxygen therapy     patient uses at nite- 2L- has not used in > 6 months per patient  . Dysrhythmia   . Heart murmur   . Stroke ([place])   . Asthma   . History of bronchitis   . Cataracts, bilateral   . Hard of hearing   . H/O dizziness   . Repeated falls   . Hypothyroidism   . History of kidney stones   . Urinary incontinence   . History of urinary tract infection   . H/O urinary frequency   . Urinary urgency   . History of blood transfusion   . Aortic regurgitation   . Sciatica     PSH: Past Surgical History  Procedure Laterality Date  . Pacemaker insertion      Medtronic  . Transcatheter aortic valve replacement, transfemoral  12/21/2012  a. 49m Edwards Sapien XT transcatheter heart valve placed via open left transfemoral approach b. Intra-op TEE: well-seated bioprosthetic aortic valve with mean gradient 2 mmHg, trivial AI, mild MR, EF 30-35%  . Transcatheter aortic valve replacement, transfemoral N/A 12/21/2012    Procedure: TRANSCATHETER AORTIC VALVE REPLACEMENT, TRANSFEMORAL;  Surgeon: MSherren Mocha MD;  [place]  Service: Open Heart Surgery;  Laterality: N/A;  . Intraoperative transesophageal echocardiogram N/A 12/21/2012    Procedure: INTRAOPERATIVE TRANSESOPHAGEAL ECHOCARDIOGRAM;  Surgeon: MSherren Mocha MD;  [facility]  Service: Open Heart Surgery;  Laterality: N/A;  . Central venous catheter insertion Left 12/21/2012    Procedure: INSERTION CENTRAL LINE ADULT;  Surgeon: MSherren Mocha MD;  [place] [facility]  Service: Open Heart Surgery;  Laterality: Left;  . Cataract  extraction    . Bunionectomy    . Appendectomy    . Abdominal hysterectomy    . Thyroidectomy, partial    . Back surgery    . Knee surgery Left   . Nose surgery      X 2  . Left and right heart catheterization with coronary angiogram N/A 10/18/2012    Procedure: LEFT AND RIGHT HEART CATHETERIZATION WITH CORONARY ANGIOGRAM;  Surgeon: MSherren Mocha MD;  [facility];  Service: Cardiovascular;  Laterality: N/A;  . Nasal septum surgery    . Total knee arthroplasty Right 12/04/2014    Procedure: RIGHT TOTAL  KNEE ARTHROPLASTY;  Surgeon: MParalee Cancel MD;  Location: [facility];  Service: Orthopedics;  Laterality: Right;  . Tonsillectomy    . Cardiac catheterization    . Shoulder surgery      bilat   . Elbow surgery      bilat     Social History:  reports that she has never smoked. She has never used smokeless tobacco. She reports that she does not drink alcohol or use illicit drugs.  Allergies:  Allergies  Allergen Reactions  . Nubain [Nalbuphine Hcl] Hives    Went into cardiac arrest   . Codeine Hives and Nausea Only  . Darvocet [Propoxyphene N-Acetaminophen] Nausea Only    Medications: No current facility-administered medications for this encounter.   Current Outpatient Prescriptions  Medication Sig Dispense Refill  . alendronate (FOSAMAX) 70 MG tablet Take 70 mg by mouth once a week. Saturday.    .Marland Kitchenamiodarone (PACERONE) 200 MG tablet Take 0.5 tablets (100 mg total) by mouth daily.    .Marland KitchenBIOTIN 5000 PO Take 5,000 mcg by mouth every evening.     . calcitRIOL (ROCALTROL) 0.25 MCG capsule Take 0.25 mcg by mouth every morning.     . carvedilol (COREG) 12.5 MG tablet Take 1 tablet by mouth 2 (two) times daily.  3  . cetirizine (ZYRTEC) 10 MG tablet Take 10 mg by mouth every morning.     . Cholecalciferol (VITAMIN D) 1000 UNITS capsule Take 2,000 Units by mouth at bedtime.     . colchicine 0.6 MG tablet Take 0.5 tablets (0.3 mg total) by mouth daily. (Patient taking  differently: Take 0.3 mg by mouth every other day. ) 30 tablet 1  . cycloSPORINE (RESTASIS) 0.05 % ophthalmic emulsion Place 1 drop into both eyes 2 (two) times daily as needed (dry eyes.).    .Marland Kitchendocusate sodium (COLACE) 50 MG capsule Take 50 mg by mouth 2 (two) times daily.     .Marland Kitchendonepezil (ARICEPT) 10 MG tablet Take 10 mg by mouth at bedtime.     . DULoxetine (CYMBALTA) 60 MG capsule  Take 60 mg by mouth every evening.     . ferrous sulfate 325 (65 FE) MG tablet Take 1 tablet (325 mg total) by mouth 3 (three) times daily after meals.  3  . hydrALAZINE (APRESOLINE) 10 MG tablet Take 10 mg by mouth 3 (three) times daily.     Marland Kitchen HYDROcodone-acetaminophen (NORCO/VICODIN) 5-325 MG per tablet Take 0.5 tablets by mouth every 6 (six) hours as needed for moderate pain.     Marland Kitchen ipratropium-albuterol (DUONEB) 0.5-2.5 (3) MG/3ML SOLN Take 3 mLs by nebulization daily as needed.     Marland Kitchen levothyroxine (SYNTHROID, LEVOTHROID) 100 MCG tablet Take 100 mcg by mouth every morning.     . Linaclotide (LINZESS) 145 MCG CAPS capsule Take 145 mcg by mouth every other day. Alternates with the 290 mg tablet in the am.    . Linaclotide 290 MCG CAPS Take 290 mcg by mouth every other day. Alternates with the 145 mg tablets in the morning.    . memantine (NAMENDA) 10 MG tablet Take 10 mg by mouth 2 (two) times daily.     . Multiple Vitamin (MULTIVITAMIN) tablet Take 0.5 tablets by mouth every morning.     . nitroGLYCERIN (NITROSTAT) 0.4 MG SL tablet Place 0.4 mg under the tongue every 5 (five) minutes as needed for chest pain (MAX 3 TABLETS).     . NON FORMULARY 2 Liters oxygen as needed for shortness of breath    . omeprazole (PRILOSEC) 20 MG capsule Take 20 mg by mouth daily.    . polyethylene glycol (MIRALAX / GLYCOLAX) packet Take 17 g by mouth 2 (two) times daily. 14 each 0  . pravastatin (PRAVACHOL) 40 MG tablet Take 40 mg by mouth every evening.     . pregabalin (LYRICA) 75 MG capsule Take 75 mg by mouth 2 (two) times daily.      Marland Kitchen torsemide (DEMADEX) 20 MG tablet Take 20 mg by mouth 2 (two) times daily. PEr Dr Posey Pronto - patient to not take on 8/14 nor am of 8/15    . Turmeric 500 MG CAPS Take 1 capsule by mouth 2 (two) times daily.    Marland Kitchen ULORIC 40 MG tablet Take 40 mg by mouth every evening.     . vitamin B-12 (CYANOCOBALAMIN) 1000 MCG tablet Take 1,000 mcg by mouth 2 (two) times a week. Tuesday and Friday    . warfarin (COUMADIN) 5 MG tablet Take 2.5 tablets by mouth daily.      Facility-Administered Medications Ordered in Other Encounters  Medication Dose Route Frequency Provider Last Rate Last Dose  . sodium bicarbonate first hour bolus via infusion   Intravenous Once Jani Gravel, MD        Results for orders placed or performed during the hospital encounter of 03/07/15 (from the past 48 hour(s))  Urinalysis, Routine w reflex microscopic (not at [facility])     Status: Abnormal   Collection Time: 03/07/15 12:13 PM  Result Value Ref Range   Color, Urine YELLOW YELLOW   APPearance CLEAR CLEAR   Specific Gravity, Urine 1.010 1.005 - 1.030   pH 6.0 5.0 - 8.0   Glucose, UA NEGATIVE NEGATIVE mg/dL   Hgb urine dipstick NEGATIVE NEGATIVE   Bilirubin Urine NEGATIVE NEGATIVE   Ketones, ur NEGATIVE NEGATIVE mg/dL   Protein, ur NEGATIVE NEGATIVE mg/dL   Nitrite NEGATIVE NEGATIVE   Leukocytes, UA TRACE (A) NEGATIVE  Urine microscopic-add on     Status: Abnormal   Collection Time: 03/07/15 12:13 PM  Result  Value Ref Range   Squamous Epithelial / LPF 0-5 (A) NONE SEEN    Comment: Please note change in reference range.   WBC, UA 0-5 0 - 5 WBC/hpf    Comment: Please note change in reference range.   RBC / HPF NONE SEEN 0 - 5 RBC/hpf    Comment: Please note change in reference range.   Bacteria, UA NONE SEEN NONE SEEN    Comment: Please note change in reference range.  Surgical pcr screen     Status: None   Collection Time: 03/07/15 12:14 PM  Result Value Ref Range   MRSA, PCR NEGATIVE NEGATIVE   Staphylococcus aureus  NEGATIVE NEGATIVE    Comment:        The Xpert SA Assay (FDA approved for NASAL specimens in patients over 65 years of age), is one component of a comprehensive surveillance program.  Test performance has been validated by [facility] for patients greater than or equal to 25 year old. It is not intended to diagnose infection nor to guide or monitor treatment.   APTT     Status: Abnormal   Collection Time: 03/07/15 12:20 PM  Result Value Ref Range   aPTT 44 (H) 24 - 37 seconds    Comment:        IF BASELINE aPTT IS ELEVATED, SUGGEST PATIENT RISK ASSESSMENT BE USED TO DETERMINE APPROPRIATE ANTICOAGULANT THERAPY.   Basic metabolic panel     Status: Abnormal   Collection Time: 03/07/15 12:20 PM  Result Value Ref Range   Sodium 141 135 - 145 mmol/L   Potassium 5.3 (H) 3.5 - 5.1 mmol/L   Chloride 104 101 - 111 mmol/L   CO2 31 22 - 32 mmol/L   Glucose, Bld 108 (H) 65 - 99 mg/dL   BUN 51 (H) 6 - 20 mg/dL   Creatinine, Ser 2.34 (H) 0.44 - 1.00 mg/dL   Calcium 9.8 8.9 - 10.3 mg/dL   GFR calc non Af Amer 19 (L) >60 mL/min   GFR calc Af Amer 21 (L) >60 mL/min    Comment: (NOTE) The eGFR has been calculated using the CKD EPI equation. This calculation has not been validated in all clinical situations. eGFR's persistently <60 mL/min signify possible Chronic Kidney Disease.    Anion gap 6 5 - 15  CBC     Status: Abnormal   Collection Time: 03/07/15 12:20 PM  Result Value Ref Range   WBC 7.4 4.0 - 10.5 K/uL   RBC 3.54 (L) 3.87 - 5.11 MIL/uL   Hemoglobin 10.7 (L) 12.0 - 15.0 g/dL   HCT 33.4 (L) 36.0 - 46.0 %   MCV 94.4 78.0 - 100.0 fL   MCH 30.2 26.0 - 34.0 pg   MCHC 32.0 30.0 - 36.0 g/dL   RDW 14.6 11.5 - 15.5 %   Platelets 215 150 - 400 K/uL  Protime-INR     Status: Abnormal   Collection Time: 03/07/15 12:20 PM  Result Value Ref Range   Prothrombin Time 28.4 (H) 11.6 - 15.2 seconds   INR 2.71 (H) 0.00 - 1.49  Type and screen Order type and screen if day of surgery is  less than 15 days from draw of preadmission visit or order morning of surgery if day of surgery is greater than 6 days from preadmission visit.     Status: None   Collection Time: 03/07/15 12:20 PM  Result Value Ref Range   ABO/RH(D) O POS    Antibody Screen NEG  Sample Expiration 03/11/2015    Extend sample reason NO TRANSFUSIONS OR PREGNANCY IN THE PAST 3 MONTHS       Review of Systems  Constitutional: Negative.   Eyes: Negative.   Respiratory: Negative.   Cardiovascular: Negative.   Gastrointestinal: Positive for heartburn.  Genitourinary: Negative.   Musculoskeletal: Positive for back pain and joint pain.  Skin: Negative.   Neurological: Positive for headaches.  Endo/Heme/Allergies: Negative.   Psychiatric/Behavioral: Positive for memory loss.       Physical Exam  Constitutional: She appears well-developed and well-nourished.  HENT:  Head: Normocephalic.  Eyes: Pupils are equal, round, and reactive to light.  Neck: Neck supple. No JVD present. No tracheal deviation present. No thyromegaly present.  Cardiovascular: Normal rate and intact distal pulses.   Murmur heard. Respiratory: Effort normal and breath sounds normal. No respiratory distress. She has no wheezes.  GI: There is no tenderness.  Musculoskeletal:       Right knee: She exhibits decreased range of motion, swelling, effusion, deformity, laceration (healed previous incision), abnormal alignment and abnormal patellar mobility. She exhibits no ecchymosis and no erythema. Tenderness found.  Lymphadenopathy:    She has no cervical adenopathy.  Neurological: She is alert.  Skin: Skin is warm and dry.  Psychiatric: She has a normal mood and affect.        Assessment/Plan Assessment:      Rupture of the right patella tendon   Plan: Patient will undergo a ORIF of the right patella tendon on 03/08/2015 per Dr. Alvan Dame at [facility]. Risks benefits and expectations were discussed with the patient.  Patient understand risks, benefits and expectations and wishes to proceed.     West Pugh Raymonda Pell   PA-C  03/08/2015, 11:18 AM

## 2015-03-08 NOTE — Progress Notes (Signed)
ANTICOAGULATION CONSULT NOTE - Initial Consult  Pharmacy Consult for Warfarin Indication: chronic Warfarin for AVR and Afib  Allergies  Allergen Reactions  . Nubain [Nalbuphine Hcl] Hives    Went into cardiac arrest   . Codeine Hives and Nausea Only  . Darvocet [Propoxyphene N-Acetaminophen] Nausea Only   Patient Measurements: Height: 4\' 10"  (147.3 cm) Weight: 178 lb 9 oz (80.995 kg) IBW/kg (Calculated) : 40.9  Vital Signs: Temp: 98 F (36.7 C) (11/17 2021) Temp Source: Oral (11/17 1400) BP: 151/44 mmHg (11/17 2021) Pulse Rate: 60 (11/17 2021)  Labs:  Recent Labs  03/07/15 1220 03/08/15 1450  HGB 10.7*  --   HCT 33.4*  --   PLT 215  --   APTT 44*  --   LABPROT 28.4* 21.1*  INR 2.71* 1.83*  CREATININE 2.34*  --    Estimated Creatinine Clearance: 17.2 mL/min (by C-G formula based on Cr of 2.34).  Medical History: Past Medical History  Diagnosis Date  . Syncope   . Symptomatic bradycardia   . Chronic lower back pain   . Nonischemic cardiomyopathy (HCC)   . HTN (hypertension)   . HLD (hyperlipidemia)   . Atrial fibrillation (HCC)     tachy-brady syndrome with <1% recurrent PAF since pacemaker placement  . H/O: stroke   . Pulmonary embolism (H[facility])     HISTORY OF, the pt. had a recurrent bilateral pulmonary emboli in 2005, on warfarin therapy and at which time she under went implantation of IVC filter  . Anemia   . Dementia   . Gastroesophageal reflux disease   . Aortic stenosis 10/13/2012    Low EF, low gradient with severe aortic stenosis confirmed by dobutamine stress echocardiogram s/p TAVR 12/2012  . PONV (postoperative nausea and vomiting)   . Osteoarthritis   . Neuropathy (HCC)   . Spinal stenosis   . Symptomatic bradycardia 2012    s/p Medtronic PPM  . Chronic combined systolic and diastolic CHF (congestive heart failure) (H[facility])   . Fibromyalgia   . Presence of permanent cardiac pacemaker   . Sleep apnea     uses oxygen at night and PRN- not used  since > 6 months   . Shortness of breath dyspnea     with exertion   . Pneumonia     hx of x 3   . Acute on chronic renal failure [facility])     sees Dr Allena Katz   . CKD (chronic kidney disease)   . Headache     hx of migraines   . On home oxygen therapy     patient uses at nite- 2L- has not used in > 6 months per patient  . Dysrhythmia   . Heart murmur   . Stroke (HCC)   . Asthma   . History of bronchitis   . Cataracts, bilateral   . Hard of hearing   . H/O dizziness   . Repeated falls   . Hypothyroidism   . History of kidney stones   . Urinary incontinence   . History of urinary tract infection   . H/O urinary frequency   . Urinary urgency   . History of blood transfusion   . Aortic regurgitation   . Sciatica    Medications:  Scheduled:  . [START ON 03/09/2015] amiodarone  100 mg Oral Daily  . [START ON 03/09/2015] calcitRIOL  0.25 mcg Oral q morning - 10a  . carvedilol  12.5 mg Oral BID  . [START ON 03/09/2015]  ceFAZolin (  ANCEF) IV  2 g Intravenous Q6H  . celecoxib  200 mg Oral Q12H  . [START ON 03/09/2015] colchicine  0.3 mg Oral QODAY  . [START ON 03/09/2015] dexamethasone  10 mg Intravenous Once  . docusate sodium  100 mg Oral BID  . donepezil  10 mg Oral QHS  . DULoxetine  60 mg Oral QPM  . [START ON 03/09/2015] enoxaparin (LOVENOX) injection  30 mg Subcutaneous Q24H  . febuxostat  40 mg Oral QPM  . fentaNYL      . [START ON 03/09/2015] ferrous sulfate  325 mg Oral TID PC  . hydrALAZINE  10 mg Oral TID  . HYDROcodone-acetaminophen  1-2 tablet Oral Q4H  . HYDROmorphone      . HYDROmorphone      . ketorolac      . [START ON 03/09/2015] levothyroxine  100 mcg Oral QAC breakfast  . [START ON 03/09/2015] Linaclotide  145 mcg Oral QODAY  . Linaclotide  290 mcg Oral QODAY  . loratadine  10 mg Oral Daily  . memantine  10 mg Oral BID  . [START ON 03/09/2015] omeprazole  20 mg Oral Daily  . polyethylene glycol  17 g Oral BID  . pravastatin  40 mg Oral QPM  .  pregabalin  75 mg Oral BID  . [START ON 03/09/2015] torsemide  20 mg Oral BID   Assessment: 82 yoF s/p R TKA 12/04/14, needed patellar tendon rupture repair. Chronic Warfarin for AVR and Afib, concurrent Amiodarone. Last dose 11/14, baseline INR 1.83 today, plan to resume Warfarin tonight with Lovenox bridge starting 11/18.  Previous Warfarin dose  daily, Med Rec PTA report states 2.5 tablets daily, but patient discharged in August on 2.5mg  daily.  Goal of Therapy:  INR 2-3 Monitor platelets by anticoagulation protocol: Yes   Plan:   Warfarin  tonight  Daily PT/INR and CBC ordered  Otho Bellows PharmD Pager [phone] 03/08/2015, 8:52 PM

## 2015-03-08 NOTE — Anesthesia Preprocedure Evaluation (Signed)
Anesthesia Evaluation  Patient identified by MRN, date of birth, ID band Patient awake    Reviewed: Allergy & Precautions, NPO status   History of Anesthesia Complications (+) PONV  Airway Mallampati: I  TM Distance: >3 FB Neck ROM: Full    Dental   Pulmonary shortness of breath, sleep apnea , pneumonia,    breath sounds clear to auscultation       Cardiovascular hypertension, +CHF  + dysrhythmias + pacemaker  Rhythm:Regular Rate:Normal     Neuro/Psych    GI/Hepatic Neg liver ROS, GERD  ,  Endo/Other  Hypothyroidism   Renal/GU Renal disease     Musculoskeletal  (+) Arthritis ,   Abdominal   Peds  Hematology   Anesthesia Other Findings   Reproductive/Obstetrics                             Anesthesia Physical Anesthesia Plan  ASA: III  Anesthesia Plan: General   Post-op Pain Management:    Induction:   Airway Management Planned: LMA  Additional Equipment:   Intra-op Plan:   Post-operative Plan: Extubation in OR  Informed Consent: I have reviewed the patients History and Physical, chart, labs and discussed the procedure including the risks, benefits and alternatives for the proposed anesthesia with the patient or authorized representative who has indicated his/her understanding and acceptance.   Dental advisory given  Plan Discussed with: CRNA and Anesthesiologist  Anesthesia Plan Comments:         Anesthesia Quick Evaluation

## 2015-03-09 ENCOUNTER — Encounter (HOSPITAL_COMMUNITY): Payer: Self-pay | Admitting: Orthopedic Surgery

## 2015-03-09 LAB — BASIC METABOLIC PANEL
ANION GAP: 8 (ref 5–15)
BUN: 50 mg/dL — ABNORMAL HIGH (ref 6–20)
CALCIUM: 9 mg/dL (ref 8.9–10.3)
CO2: 30 mmol/L (ref 22–32)
Chloride: 104 mmol/L (ref 101–111)
Creatinine, Ser: 2.19 mg/dL — ABNORMAL HIGH (ref 0.44–1.00)
GFR calc non Af Amer: 20 mL/min — ABNORMAL LOW (ref >60)
GFR, EST AFRICAN AMERICAN: 23 mL/min — AB (ref >60)
Glucose, Bld: 159 mg/dL — ABNORMAL HIGH (ref 65–99)
POTASSIUM: 4.3 mmol/L (ref 3.5–5.1)
SODIUM: 142 mmol/L (ref 135–145)

## 2015-03-09 LAB — CBC
HCT: 29.1 % — ABNORMAL LOW (ref 36.0–46.0)
Hemoglobin: 9.4 g/dL — ABNORMAL LOW (ref 12.0–15.0)
MCH: 30.1 pg (ref 26.0–34.0)
MCHC: 32.3 g/dL (ref 30.0–36.0)
MCV: 93.3 fL (ref 78.0–100.0)
PLATELETS: 186 10*3/uL (ref 150–400)
RBC: 3.12 MIL/uL — AB (ref 3.87–5.11)
RDW: 14.6 % (ref 11.5–15.5)
WBC: 6.5 10*3/uL (ref 4.0–10.5)

## 2015-03-09 LAB — PROTIME-INR
INR: 1.68 — ABNORMAL HIGH (ref 0.00–1.49)
Prothrombin Time: 19.8 seconds — ABNORMAL HIGH (ref 11.6–15.2)

## 2015-03-09 MED ORDER — ENOXAPARIN SODIUM 30 MG/0.3ML ~~LOC~~ SOLN: 30.0000 mg | SUBCUTANEOUS | Status: DC

## 2015-03-09 MED ORDER — HYDROCODONE-ACETAMINOPHEN 7.5-325 MG PO TABS: 1.0000 | ORAL_TABLET | ORAL | Status: DC | PRN

## 2015-03-09 MED ORDER — WARFARIN SODIUM 2 MG PO TABS
2.0000 mg | ORAL_TABLET | Freq: Once | ORAL | Status: AC
  Administered 2015-03-09: 2 mg via ORAL
  Filled 2015-03-09 (×2): qty 1

## 2015-03-09 MED ORDER — WARFARIN SODIUM 2 MG PO TABS: 2.0000 mg | ORAL_TABLET | Freq: Once | ORAL | Status: DC

## 2015-03-09 MED ORDER — WARFARIN SODIUM 2.5 MG PO TABS
2.5000 mg | ORAL_TABLET | Freq: Once | ORAL | Status: DC
  Filled 2015-03-09: qty 1

## 2015-03-09 NOTE — Progress Notes (Signed)
ANTICOAGULATION CONSULT NOTE - Initial Consult  Pharmacy Consult for Warfarin Indication: chronic Warfarin for AVR and Afib  Allergies  Allergen Reactions  . Nubain [Nalbuphine Hcl] Hives    Went into cardiac arrest   . Codeine Hives and Nausea Only  . Darvocet [Propoxyphene N-Acetaminophen] Nausea Only   Patient Measurements: Height:  (147.3 cm) Weight: 178 lb 9 oz (80.995 kg) IBW/kg (Calculated) : 40.9  Vital Signs: Temp: 98.1 F (36.7 C) (11/18 1021) Temp Source: Oral (11/18 1021) BP: 131/64 mmHg (11/18 1021) Pulse Rate: 62 (11/18 1021)  Labs:  Recent Labs  03/07/15 1220 03/08/15 1450 03/08/15 2140 03/09/15 0508  HGB 10.7*  --  10.4* 9.4*  HCT 33.4*  --  31.9* 29.1*  PLT 215  --  205 186  APTT 44*  --   --   --   LABPROT 28.4* 21.1*  --  19.8*  INR 2.71* 1.83*  --  1.68*  CREATININE 2.34*  --  1.94* 2.19*   Estimated Creatinine Clearance: 18.4 mL/min (by C-G formula based on Cr of 2.19).  Medical History: Past Medical History  Diagnosis Date  . Syncope   . Symptomatic bradycardia   . Chronic lower back pain   . Nonischemic cardiomyopathy (HCC)   . HTN (hypertension)   . HLD (hyperlipidemia)   . Atrial fibrillation (HCC)     tachy-brady syndrome with <1% recurrent PAF since pacemaker placement  . H/O: stroke   . Pulmonary embolism (HCC)     HISTORY OF, the pt. had a recurrent bilateral pulmonary emboli in 2005, on warfarin therapy and at which time she under went implantation of IVC filter  . Anemia   . Dementia   . Gastroesophageal reflux disease   . Aortic stenosis 10/13/2012    Low EF, low gradient with severe aortic stenosis confirmed by dobutamine stress echocardiogram s/p TAVR 12/2012  . PONV (postoperative nausea and vomiting)   . Osteoarthritis   . Neuropathy (HCC)   . Spinal stenosis   . Symptomatic bradycardia 2012    s/p Medtronic PPM  . Chronic combined systolic and diastolic CHF (congestive heart failure) (HCC)   . Fibromyalgia    . Presence of permanent cardiac pacemaker   . Sleep apnea     uses oxygen at night and PRN- not used since > 6 months   . Shortness of breath dyspnea     with exertion   . Pneumonia     hx of x 3   . Acute on chronic renal failure [facility])     sees Dr Allena Katz   . CKD (chronic kidney disease)   . Headache     hx of migraines   . On home oxygen therapy     patient uses at nite- 2L- has not used in > 6 months per patient  . Dysrhythmia   . Heart murmur   . Stroke (HCC)   . Asthma   . History of bronchitis   . Cataracts, bilateral   . Hard of hearing   . H/O dizziness   . Repeated falls   . Hypothyroidism   . History of kidney stones   . Urinary incontinence   . History of urinary tract infection   . H/O urinary frequency   . Urinary urgency   . History of blood transfusion   . Aortic regurgitation   . Sciatica    Medications:  Scheduled:  . amiodarone  100 mg Oral Daily  . calcitRIOL  0.25 mcg  Oral q morning - 10a  . carvedilol  12.5 mg Oral BID  .  ceFAZolin (ANCEF) IV  2 g Intravenous Q6H  . celecoxib  200 mg Oral Q12H  . colchicine  0.3 mg Oral QODAY  . dexamethasone  10 mg Intravenous Once  . docusate sodium  100 mg Oral BID  . donepezil  10 mg Oral QHS  . DULoxetine  60 mg Oral QPM  . enoxaparin (LOVENOX) injection  30 mg Subcutaneous Q24H  . febuxostat  40 mg Oral QPM  . ferrous sulfate  325 mg Oral TID PC  . hydrALAZINE  10 mg Oral TID  . HYDROcodone-acetaminophen  1-2 tablet Oral Q4H  . levothyroxine  100 mcg Oral QAC breakfast  . Linaclotide  145 mcg Oral QODAY  . Linaclotide  290 mcg Oral QODAY  . loratadine  10 mg Oral Daily  . memantine  10 mg Oral BID  . omeprazole  20 mg Oral Daily  . polyethylene glycol  17 g Oral BID  . pravastatin  40 mg Oral QPM  . pregabalin  75 mg Oral BID  . torsemide  20 mg Oral BID  . Warfarin - Pharmacist Dosing Inpatient   Does not apply q1800   Assessment: 56 yoF s/p R TKA 12/04/14, needed patellar tendon rupture  repair. Chronic Warfarin for AVR and Afib, concurrent Amiodarone. Last dose 11/14, baseline INR 1.83 today, plan to resume Warfarin tonight with Lovenox bridge starting 11/18.  Previous Warfarin dose 5mg  daily, Med Rec PTA report states 2.5 tablets daily, but patient discharged in August on 2.5mg  daily.  Today, 03/09/2015   INR 1.69, PT 19.8, slight drop from yesterday. Drug given about 6 hours prior to INR drawn.  Hgb, plt slight drop, pt post-op  No reported bleeding issues  Goal of Therapy:  INR 2-3 Monitor platelets by anticoagulation protocol: Yes   Plan:   Warfarin 2 mg tonight  Daily PT/INR and CBC ordered  Adalberto Cole, PharmD, BCPS Pager [phone] 03/09/2015 1:07 PM

## 2015-03-09 NOTE — Clinical Social Work Placement (Signed)
   CLINICAL SOCIAL WORK PLACEMENT  NOTE  Date:  03/09/2015  Patient Details  Name: [name] MRN: [id number] Date of Birth: Apr 22, 1932  Clinical Social Work is seeking post-discharge placement for this patient at the Skilled  Nursing Facility level of care (*CSW will initial, date and re-position this form in  chart as items are completed):      Patient/family provided with [facility] Clinical Social Work Department's list of facilities offering this level of care within the geographic area requested by the patient (or if unable, by the patient's family).  Yes   Patient/family informed of their freedom to choose among providers that offer the needed level of care, that participate in Medicare, Medicaid or managed care program needed by the patient, have an available bed and are willing to accept the patient.  No   Patient/family informed of [facility]'s ownership interest in [facility] and [facility], as well as of the fact that they are under no obligation to receive care at these facilities.  PASRR submitted to EDS on       PASRR number received on       Existing PASRR number confirmed on 03/09/15     FL2 transmitted to all facilities in geographic area requested by pt/family on 03/09/15     FL2 transmitted to all facilities within larger geographic area on       Patient informed that his/her managed care company has contracts with or will negotiate with certain facilities, including the following:            Patient/family informed of bed offers received.  Patient chooses bed at       Physician recommends and patient chooses bed at      Patient to be transferred to   on  .  Patient to be transferred to facility by       Patient family notified on   of transfer.  Name of family member notified:        PHYSICIAN       Additional Comment:    _______________________________________________ Royetta Asal, LCSW  [phone] 03/09/2015, 10:02  AM

## 2015-03-09 NOTE — Evaluation (Signed)
Physical Therapy Evaluation Patient Details Name: [name] MRN: [id number] DOB: [birth year] Today's Date: 03/09/2015   History of Present Illness  pt was admitted for avulsion of R patellar tendon after a fall.  She is s/p ORIF.  PMH significant for stroke, HTN, R TKA  Clinical Impression  Pt admitted with above diagnosis. Pt currently with functional limitations due to the deficits listed below (see PT Problem List). Pt will benefit from skilled PT to increase their independence and safety with mobility to allow discharge to the venue listed below. Pt requires assistance for bed mobility, transfers and ambulation; will benefit from continuous rehab at [facility].          Follow Up Recommendations SNF;Supervision for mobility/OOB    Equipment Recommendations  None recommended by PT    Recommendations for Other Services       Precautions / Restrictions Precautions Precautions: Fall Required Braces or Orthoses: Other Brace/Splint Other Brace/Splint: R knee Bledsoe brace; brace at all times; No knee flexion Restrictions Weight Bearing Restrictions: No RLE Weight Bearing: Weight bearing as tolerated      Mobility  Bed Mobility Overal bed mobility: Needs Assistance Bed Mobility: Supine to Sit     Supine to sit: Min assist     General bed mobility comments: multi-modal cues for use of UEs to self-assist with trunk elevation, min assist with RLE  Transfers Overall transfer level: Needs assistance Equipment used: Rolling walker (2 wheeled) Transfers: Sit to/from Stand Sit to Stand: Min assist Stand pivot transfers: Min assist       General transfer comment: min assist steadying with rise, multiple multi-modal cues for RLE extension   Ambulation/Gait Ambulation/Gait assistance: Min assist Ambulation Distance (Feet): 25 Feet Assistive device: Rolling walker (2 wheeled) Gait Pattern/deviations: Step-to pattern;Antalgic;Decreased weight shift to right Gait velocity:  decreased   General Gait Details: VC's for RW and bil LEs sequence, correct posture and breathing;  steadying assist  during turns  Careers information officer    Modified Rankin (Stroke Patients Only)       Balance Overall balance assessment: Needs assistance           Standing balance-Leahy Scale: Poor                               Pertinent Vitals/Pain Pain Assessment: 0-10 Pain Score: 3  Faces Pain Scale: Hurts little more Pain Location: right knee Pain Descriptors / Indicators: Aching Pain Intervention(s): Limited activity within patient's tolerance;Monitored during session;Ice applied;Repositioned    Home Living Family/patient expects to be discharged to:: Skilled nursing facility Living Arrangements: Children Available Help at Discharge: Family;Available PRN/intermittently             Additional Comments: Pt with plans to go to SNF at d/c, son lives with her and is not always available to assist pt     Prior Function Level of Independence: Independent with assistive device(s)         Comments: RW     Hand Dominance        Extremity/Trunk Assessment   Upper Extremity Assessment: Overall WFL for tasks assessed           Lower Extremity Assessment: RLE deficits/detail RLE Deficits / Details: ankle WFL, knee and hip strength at least 2/5    Cervical / Trunk Assessment: Normal  Communication   Communication: HOH  Cognition Arousal/Alertness: Awake/alert  Behavior During Therapy: WFL for tasks assessed/performed Overall Cognitive Status: No family/caregiver present to determine baseline cognitive functioning       Memory: Decreased short-term memory              General Comments      Exercises        Assessment/Plan    PT Assessment Patient needs continued PT services  PT Diagnosis Difficulty walking;Abnormality of gait;Acute pain   PT Problem List Decreased strength;Decreased range of  motion;Decreased activity tolerance;Decreased mobility;Decreased knowledge of precautions;Decreased safety awareness;Decreased knowledge of use of DME;Pain  PT Treatment Interventions DME instruction;Gait training;Stair training;Functional mobility training;Therapeutic activities;Therapeutic exercise;Patient/family education   PT Goals (Current goals can be found in the Care Plan section) Acute Rehab PT Goals Patient Stated Goal: to walk without pain PT Goal Formulation: With patient Time For Goal Achievement: 03/16/15 Potential to Achieve Goals: Good    Frequency 7X/week   Barriers to discharge        Co-evaluation               End of Session Equipment Utilized During Treatment: Gait belt Activity Tolerance: Patient tolerated treatment well;No increased pain Patient left: in chair;with call bell/phone within reach;with chair alarm set Nurse Communication: Mobility status         Time: 9798-9211 PT Time Calculation (min) (ACUTE ONLY): 25 min   Charges:   PT Evaluation $Initial PT Evaluation Tier I: 1 Procedure PT Treatments $Gait Training: 8-22 mins   PT G Codes:        [name], SPT March 19, 2015, 1:23 PM

## 2015-03-09 NOTE — Op Note (Signed)
NAMEKAWEHI, HOSTETTER NO.:  [id number]  MEDICAL RECORD NO.:  [id number]  LOCATION:  1602                         FACILITY:  [facility]  PHYSICIAN:  Madlyn Frankel. Charlann Boxer, M.D.  DATE OF BIRTH:  1933-04-06  DATE OF PROCEDURE:  03/08/2015 DATE OF DISCHARGE:                              OPERATIVE REPORT   PREOPERATIVE DIAGNOSIS:  Status post right total knee arthroplasty, with a traumatic avulsion of the right patella tendon off the tibial tubercle.  POSTOPERATIVE DIAGNOSIS:  Status post right total knee arthroplasty, with a traumatic avulsion of the right patella tendon off the tibial tubercle.  PROCEDURE:  Open reduction and repair of the right patella tendon back to the tibial tubercle.  SURGEON:  Madlyn Frankel. Charlann Boxer, M.D.  ASSISTANT:  Lanney Gins, PA-C.  Note that Mr. Carmon Sails was present for the entirety of the case from preoperative position, perioperative management of the operative extremity, general facilitation of the case, and primary wound closure.  ANESTHESIA:  General.  SPECIMENS:  None.  COMPLICATIONS:  None.  BLOOD LOSS:  None.  TOURNIQUET:  Tourniquet per anesthetic record, at 250 mmHg due to Coumadin use.  INDICATIONS FOR PROCEDURE:  Ms. Steppe is an 81 year old female, patient of mine, with a recent history right total knee arthroplasty. She has recovered well with regard to extension and flexion, but had been having multiple falls per she and her son as report.  On one of her most recent falls with hyperflexion, she lost the ability to actively extend, was seen in the office.  She is noted to have a high-riding patella with a fleck of bone off the tibial tubercle identified, consistent with a patellar tendon avulsion injury.  Indications for the procedure were reviewed as obvious.  Risks of infection, failure to have this heal, and loss of motion were all discussed and reviewed.  Consent was obtained for benefit of improved functional  motion by improving the extension and quad function. Expected limitations in flexion were reviewed.  PROCEDURE IN DETAIL:  The patient was brought to operative theater. Once adequate anesthesia, preoperative antibiotics, Ancef administered, she was positioned supine with a right thigh tourniquet placed.  The right lower extremity was then prepped and draped in sterile fashion.  A time-out was performed identifying the patient, planned procedure, and extremity.  As was previously discussed, the patient's incision was extended from around the inferior pole of patella and distal about probably 6-8 cm beyond the tip of her previous incision to allow for exposure as well as placement of an anchoring screw in the proximal metaphysis of the tibia.  Soft tissue planes were created, injury identified and readily palpable.  The first thing I did was to create an arthrotomy medially and laterally.  We identified the bone fragment.  I then debrided with a rongeur sharply the scar tissue intra-articularly behind the patella tendon as well as at the insertion site of the tibial tubercle.  I then proceeded to place #2 FiberWire sutures in a Krackow weave pattern through the patella tendon with 4 strands exiting distally.  With the bed of the bone at the tibial tubercle prepared, Babish then held tension with the patella tendon restored to  its anatomic position while I placed a cancellous cannulated screw medial to the tibial tray in an anterior-posterior direction.  The guidewire was inserted and measured the screw depth.  We selected a 60 mm partially threaded cancellous screw.  This was then passed using a spider-type washer to increase the surface area for compression down of the tendon onto the tibial tubercle.  While this was carried out, we also held gentle tension at this point as she had good firm fixation of this area, but we then placed a separate cancellous screw into the proximal  medial based tibia.  This was passed nearly bicortical and held up as a post at which point, I wrapped the 4 strands, 2 on each side around the screw head area and then sutured down to this and then tightened this down to bone.  At this point, we brought fluoroscopy into the field and confirmed the depth of penetration of the screws.  Once this was confirmed, we irrigated the knee with normal saline solution.  I reapproximated the medial and lateral retinacular repairs using #1 Vicryl.  The remaining wound was closed with 2-0 Vicryl and then a running 4-0 Monocryl.  The knee was then cleaned, dried, and dressed sterilely using surgical glue and an Aquacel dressing.  She was wrapped in Ace wrap and placed in a knee immobilizer to protect the repair.  The procedure reviewed with her son, will keep her in the hospital for 1 or 2 days, work on physical therapy and disposition issues.     Madlyn Frankel Charlann Boxer, M.D.     MDO/MEDQ  D:  03/08/2015  T:  03/08/2015  Job:  [id number]

## 2015-03-09 NOTE — Discharge Summary (Signed)
Physician Discharge Summary  Patient ID: [name] MRN: [id number] DOB/AGE: 1932/11/02 81 y.o.  Admit date: 03/08/2015 Discharge date:  03/12/2015  Procedures:  Procedure(s) (LRB):  OPEN REDUCTION INTERNAL FIXATION RIGHT  PATELLA TENDON AVULSION (Right)  Attending Physician:  Dr. Durene Romans   Admission Diagnoses:   Rupture of the right patella tendon S/P right TKA  Discharge Diagnoses:  Principal Problem:   Avulsion of right patellar tendon Active Problems:   Paroxysmal atrial fibrillation (HCC)   Chronic diastolic heart failure (HCC)   CKD (chronic kidney disease), stage III  Past Medical History  Diagnosis Date  . Syncope   . Symptomatic bradycardia   . Chronic lower back pain   . Nonischemic cardiomyopathy (HCC)   . HTN (hypertension)   . HLD (hyperlipidemia)   . Atrial fibrillation (HCC)     tachy-brady syndrome with <1% recurrent PAF since pacemaker placement  . H/O: stroke   . Pulmonary embolism (HCC)     HISTORY OF, the pt. had a recurrent bilateral pulmonary emboli in 2005, on warfarin therapy and at which time she under went implantation of IVC filter  . Anemia   . Dementia   . Gastroesophageal reflux disease   . Aortic stenosis 10/13/2012    Low EF, low gradient with severe aortic stenosis confirmed by dobutamine stress echocardiogram s/p TAVR 12/2012  . PONV (postoperative nausea and vomiting)   . Osteoarthritis   . Neuropathy (HCC)   . Spinal stenosis   . Symptomatic bradycardia 2012    s/p [employer] PPM  . Chronic combined systolic and diastolic CHF (congestive heart failure) (HCC)   . Fibromyalgia   . Presence of permanent cardiac pacemaker   . Sleep apnea     uses oxygen at night and PRN- not used since > 6 months   . Shortness of breath dyspnea     with exertion   . Pneumonia     hx of x 3   . Acute on chronic renal failure [facility])     sees Dr Allena Katz   . CKD (chronic kidney disease)   . Headache     hx of migraines   . On home  oxygen therapy     patient uses at nite- 2L- has not used in > 6 months per patient  . Dysrhythmia   . Heart murmur   . Stroke (HCC)   . Asthma   . History of bronchitis   . Cataracts, bilateral   . Hard of hearing   . H/O dizziness   . Repeated falls   . Hypothyroidism   . History of kidney stones   . Urinary incontinence   . History of urinary tract infection   . H/O urinary frequency   . Urinary urgency   . History of blood transfusion   . Aortic regurgitation   . Sciatica     HPI:    Pt is a 81 y.o. female who had a right TKA on 12/04/2014. Upon subsequent visit to the office she was having difficulty with the right knee. Pain had continually increased since the beginning. She has experienced some falls since the knee replacement and returned to the clinic with issues extending her knee. X-rays in the clinic show a high riding patella indicating a patella tendon rupture. Various options are discussed with the patient. Risks, benefits and expectations were discussed with the patient. Patient understand the risks, benefits and expectations and wishes to proceed with surgery.    PCP: Selena Batten,  Fayrene Fearing, MD   Discharged Condition: fair  Hospital Course:  Patient underwent the above stated procedure on 03/08/2015. Patient tolerated the procedure well and brought to the recovery room in good condition and subsequently to the floor.  POD #1 BP: 125/61 ; Pulse: 61 ; Temp: 97.7 F (36.5 C) ; Resp: 14 Patient reports pain as moderate, pain controlled with medications. Do to the complexity of the patient patella avulsion rupture so close to her TKA there is an increase chance of failure warranting a inpatient stay prior to being admitted to a skilled nursing facility . She also has a complex medical history needing to be watched further to help with success of the latest repair. Dorsiflexion/plantar flexion intact, incision: dressing C/D/I, no cellulitis present and compartment soft.   LABS    Basename    HGB     9.4  HCT     29.1   POD #2  BP:   137/57 ; Pulse: 65 ; Temp: 97.2 F (36.2 C) ; Resp: 16 Patient reports pain as 3 on 0-10 scale. Cr 2.85. UO 650.  BMET in AM Hydration continue. Check I/O. Dorsiflexion/plantar flexion intact, incision: dressing C/D/I, no cellulitis present and compartment soft.   LABS  Basename    HGB     8.8  HCT     14.2    POD #3  BP:   164/73 ; Pulse: 64 ; Temp: 97.5 F (36.4 C) ; Resp: 16 Patient reports pain as mild, pain controled. Renal insufficiency has caused pharmacy to adjust her medications.  Acute on chronic renal insufficiency and treating will be hydration and observation at this time. Dorsiflexion/plantar flexion intact, incision: dressing C/D/I, no cellulitis present and compartment soft.   LABS  Basename    HGB     8.6  HCT     26.3    POD #4  BP:   170/67 ; Pulse: 62 ; Temp: 97.6 F (36.4 C) ; Resp: 18 Patient reports pain as mild, pain controlled. Patient resting comfortably in the chair. She does have a Bledsoe brace on, locked in extension. Ready to be discharged skilled nursing facility. Dorsiflexion/plantar flexion intact, incision: dressing C/D/I, no cellulitis present and compartment soft.   LABS  Basename    HGB     9.2  HCT     28.4     Discharge Exam: General appearance: alert, cooperative and no distress Extremities: Homans sign is negative, no sign of DVT, no edema, redness or tenderness in the calves or thighs and no ulcers, gangrene or trophic changes  Disposition:   Skilled Nursing Facility with follow up in 2 weeks   Follow-up Information    Follow up with [name], MD. Schedule an appointment as soon as possible for a visit in 2 weeks.   Specialty:  Orthopedic Surgery   Contact information:   [address] [place] [address] [phone]       Follow up with [facility] .   Specialty:  Skilled Nursing Facility   Contact information:   [address] [place] [address] [phone]      Follow up with [name], MD. Schedule an appointment as soon as possible for a visit in 2 weeks.   Specialty:  Orthopedic Surgery   Contact information:   [address] [place] [address] [phone]           Discharge Instructions    Call MD / Call 911  Complete by:  As directed   If you experience chest pain or shortness of breath, CALL 911 and be transported to the hospital emergency room.  If you develope a fever above 101 F, pus (white drainage) or increased drainage or redness at the wound, or calf pain, call your surgeon's office.     Constipation Prevention    Complete by:  As directed   Drink plenty of fluids.  Prune juice may be helpful.  You may use a stool softener, such as Colace (over the counter) 100 mg twice a day.  Use MiraLax (over the counter) for constipation as needed.     Diet - low sodium heart healthy    Complete by:  As directed      Discharge instructions    Complete by:  As directed   Maintain surgical dressing until follow up in the clinic. If the edges start to pull up, may reinforce with tape. If the dressing is no longer working, may remove and cover with gauze and tape, but must keep the area dry and clean.  Follow up in 2 weeks at [facility]. Call with any questions or concerns.     Weight bearing as tolerated    Complete by:  As directed   With bledsoe brace locked in extension at all times  Laterality:  right  Extremity:  Lower             Medication List    STOP taking these medications        HYDROcodone-acetaminophen 5-325 MG tablet  Commonly known as:  NORCO/VICODIN  Replaced by:  HYDROcodone-acetaminophen 7.5-325 MG tablet      TAKE these medications        alendronate 70 MG tablet  Commonly known as:  FOSAMAX  Take 70 mg by mouth once a week. Saturday.     amiodarone 200 MG tablet  Commonly known as:  PACERONE  Take 0.5  tablets (100 mg total) by mouth daily.     BIOTIN 5000 PO  Take 5,000 mcg by mouth every evening.     calcitRIOL 0.25 MCG capsule  Commonly known as:  ROCALTROL  Take 0.25 mcg by mouth every morning.     carvedilol 12.5 MG tablet  Commonly known as:  COREG  Take 1 tablet by mouth 2 (two) times daily.     cetirizine 10 MG tablet  Commonly known as:  ZYRTEC  Take 10 mg by mouth every morning.     colchicine 0.6 MG tablet  Take 0.5 tablets (0.3 mg total) by mouth daily.     cycloSPORINE 0.05 % ophthalmic emulsion  Commonly known as:  RESTASIS  Place 1 drop into both eyes 2 (two) times daily as needed (dry eyes.).     docusate sodium 50 MG capsule  Commonly known as:  COLACE  Take 50 mg by mouth 2 (two) times daily.     donepezil 10 MG tablet  Commonly known as:  ARICEPT  Take 10 mg by mouth at bedtime.     DULoxetine 60 MG capsule  Commonly known as:  CYMBALTA  Take 60 mg by mouth every evening.     enoxaparin 30 MG/0.3ML injection  Commonly known as:  LOVENOX  Inject 0.3 mLs (30 mg total) into the skin daily.     ferrous sulfate 325 (65 FE) MG tablet  Take 1 tablet (325 mg total) by mouth 3 (three) times daily after meals.     hydrALAZINE 10 MG tablet  Commonly known as:  APRESOLINE  Take 10 mg by mouth 3 (three) times daily.     HYDROcodone-acetaminophen 7.5-325 MG tablet  Commonly known as:  NORCO  Take 1-2 tablets by mouth every 4 (four) hours as needed for moderate pain.     ipratropium-albuterol 0.5-2.5 (3) MG/3ML Soln  Commonly known as:  DUONEB  Take 3 mLs by nebulization daily as needed.     levothyroxine 100 MCG tablet  Commonly known as:  SYNTHROID, LEVOTHROID  Take 100 mcg by mouth every morning.     LINZESS 145 MCG Caps capsule  Generic drug:  Linaclotide  Take 145 mcg by mouth every other day. Alternates with the 290 mg tablet in the am.     Linaclotide 290 MCG Caps capsule  Commonly known as:  LINZESS  Take 290 mcg by mouth every other day.  Alternates with the 145 mg tablets in the morning.     memantine 10 MG tablet  Commonly known as:  NAMENDA  Take 10 mg by mouth 2 (two) times daily.     multivitamin tablet  Take 0.5 tablets by mouth every morning.     nitroGLYCERIN 0.4 MG SL tablet  Commonly known as:  NITROSTAT  Place 0.4 mg under the tongue every 5 (five) minutes as needed for chest pain (MAX 3 TABLETS).     NON FORMULARY  2 Liters oxygen as needed for shortness of breath     omeprazole 20 MG capsule  Commonly known as:  PRILOSEC  Take 20 mg by mouth daily.     polyethylene glycol packet  Commonly known as:  MIRALAX / GLYCOLAX  Take 17 g by mouth 2 (two) times daily.     pravastatin 40 MG tablet  Commonly known as:  PRAVACHOL  Take 40 mg by mouth every evening.     pregabalin 75 MG capsule  Commonly known as:  LYRICA  Take 75 mg by mouth 2 (two) times daily.     torsemide 20 MG tablet  Commonly known as:  DEMADEX  Take 20 mg by mouth 2 (two) times daily. PEr Dr Allena Katz - patient to not take on 8/14 nor am of 8/15     Turmeric 500 MG Caps  Take 1 capsule by mouth 2 (two) times daily.     ULORIC 40 MG tablet  Generic drug:  febuxostat  Take 40 mg by mouth every evening.     vitamin B-12 1000 MCG tablet  Commonly known as:  CYANOCOBALAMIN  Take 1,000 mcg by mouth 2 (two) times a week. Tuesday and Friday     Vitamin D 1000 UNITS capsule  Take 2,000 Units by mouth at bedtime.     warfarin 5 MG tablet  Commonly known as:  COUMADIN  Take 2.5 tablets by mouth daily.     warfarin 2 MG tablet  Commonly known as:  COUMADIN  Take 1 tablet (2 mg total) by mouth one time only at 6 PM.         Signed:     Anastasio Auerbach. Cavion Faiola   PA-C  03/12/2015, 10:27 AM

## 2015-03-09 NOTE — Care Management Note (Signed)
Case Management Note  Patient Details  Name: [name] MRN: [id number] Date of Birth: [birth year]  Subjective/Objective:     S/p Open reduction and repair of the right patella tendon back to the tibial tubercle.               Action/Plan: Discharge planning per CSW  Expected Discharge Date:                  Expected Discharge Plan:  Skilled Nursing Facility  In-House Referral:  Clinical Social Work  Discharge planning Services  CM Consult  Post Acute Care Choice:  NA Choice offered to:  NA  DME Arranged:  N/A DME Agency:  NA  HH Arranged:  NA HH Agency:  NA  Status of Service:  Completed, signed off  Medicare Important Message Given:    Date Medicare IM Given:    Medicare IM give by:    Date Additional Medicare IM Given:    Additional Medicare Important Message give by:     If discussed at Long Length of Stay Meetings, dates discussed:    Additional Comments:  Alexis Goodell, RN 03/09/2015, 11:28 AM

## 2015-03-09 NOTE — Progress Notes (Signed)
     Subjective: 1 Day Post-Op Procedure(s) (LRB):  OPEN REDUCTION INTERNAL FIXATION RIGHT  PATELLA TENDON AVULSION (Right)   Patient reports pain as moderate, pain controlled with medications.  Do to the complexity of the patient patella avulsion rupture so close to her TKA there is an increase chance of failure warranting a inpatient stay prior to being admitted to a skilled nursing facility .  She also has a complex medical history needing to be watched further to help with success of the latest repair.  Objective:   VITALS:   Filed Vitals:   03/09/15 0613  BP: 125/61  Pulse: 61  Temp: 97.7 F (36.5 C)  Resp: 14    Dorsiflexion/Plantar flexion intact Incision: dressing C/D/I No cellulitis present Compartment soft  LABS  Recent Labs  03/07/15 1220 03/08/15 2140 03/09/15 0508  HGB 10.7* 10.4* 9.4*  HCT 33.4* 31.9* 29.1*  WBC 7.4 8.4 6.5  PLT 215 205 186     Recent Labs  03/07/15 1220 03/08/15 2140 03/09/15 0508  NA 141  --  142  K 5.3*  --  4.3  BUN 51*  --  50*  CREATININE 2.34* 1.94* 2.19*  GLUCOSE 108*  --  159*     Assessment/Plan: 1 Day Post-Op Procedure(s) (LRB):  OPEN REDUCTION INTERNAL FIXATION RIGHT  PATELLA TENDON AVULSION (Right) FOley cath d/c'ed Advance diet Up with therapy D/C IV fluids Discharge to SNF eventually, when ready      Anastasio Auerbach. Jazper Nikolai   PAC  03/09/2015, 8:31 AM

## 2015-03-09 NOTE — [place] FL2 (Signed)
[place] MEDICAID FL2 LEVEL OF CARE SCREENING TOOL     IDENTIFICATION  Patient Name: [name] Birthdate: Aug 14, 1932 Sex: female Admission Date (Current Location): 03/08/2015  [facility] and [place] Number:     Facility and Address:  [facility],  [address] [place] [address]      Provider Number: [id number]  Attending Physician Name and Address:  Durene Romans, MD  Relative Name and Phone Number:       Current Level of Care: Hospital Recommended Level of Care: Skilled Nursing Facility Prior Approval Number:    Date Approved/Denied:   PASRR Number: [id number]  Discharge Plan: SNF    Current Diagnoses: Patient Active Problem List   Diagnosis Date Noted  . Avulsion of right patellar tendon 03/08/2015  . Aspiration pneumonia (HCC) 12/14/2014  . Anemia 12/14/2014  . Aspiration into airway 12/14/2014  . Swallowing dysfunction 12/14/2014  . S/P right TKA 12/04/2014  . S/P knee replacement 12/04/2014  . Acute blood loss anemia 12/28/2012  . Thrombocytopenia (HCC) 12/28/2012  . Toe fracture 12/28/2012  . CKD (chronic kidney disease), stage III 12/28/2012  . Low back pain 12/28/2012  . Wound dehiscence, surgical 12/28/2012  . Atelectasis 12/28/2012  . S/P TAVR (transcatheter aortic valve replacement) 12/21/2012  . Severe aortic stenosis 12/06/2012  . Aortic stenosis 10/13/2012  . Acute on chronic systolic and diastolic heart failure, NYHA class 3 (HCC) 10/13/2012  . Pacemaker 10/15/2010  . Paroxysmal atrial fibrillation (HCC) 10/15/2010  . Dyslipidemia 10/15/2010  . Hypertension 10/15/2010  . Chronic diastolic heart failure (HCC) 10/15/2010    Orientation ACTIVITIES/SOCIAL BLADDER RESPIRATION    Self, Time, Situation, Place  Passive Continent Normal  BEHAVIORAL SYMPTOMS/MOOD NEUROLOGICAL BOWEL NUTRITION STATUS   (no behaviors)   Continent Diet  PHYSICIAN VISITS COMMUNICATION OF NEEDS Height & Weight Skin    Verbally 4\' 10"  (147.3  cm) 178 lbs. Surgical wounds          AMBULATORY STATUS RESPIRATION    Assist extensive Normal      Personal Care Assistance Level of Assistance  Bathing, Dressing Bathing Assistance: Maximum assistance   Dressing Assistance: Maximum assistance      Functional Limitations Info  Sight, Hearing, Speech Sight Info: Adequate Hearing Info: Impaired Speech Info: Adequate       SPECIAL CARE FACTORS FREQUENCY  PT (By licensed PT)     PT Frequency: 6-7 x wkly             Additional Factors Info  Code Status Code Status Info: Full Code             Current Medications (03/09/2015): Current Facility-Administered Medications  Medication Dose Route Frequency Provider Last Rate Last Dose  . alum & mag hydroxide-simeth (MAALOX/MYLANTA) 200-200-20 MG/5ML suspension 30 mL  30 mL Oral Q4H PRN Lanney Gins, PA-C      . amiodarone (PACERONE) tablet 100 mg  100 mg Oral Daily Lanney Gins, PA-C      . bisacodyl (DULCOLAX) suppository 10 mg  10 mg Rectal Daily PRN Lanney Gins, PA-C      . calcitRIOL (ROCALTROL) capsule 0.25 mcg  0.25 mcg Oral q morning - 10a Lanney Gins, PA-C      . carvedilol (COREG) tablet 12.5 mg  12.5 mg Oral BID Lanney Gins, PA-C   12.5 mg at 03/08/15 2232  . ceFAZolin (ANCEF) IVPB 2 g/50 mL premix  2 g Intravenous Q6H Lanney Gins, PA-C      . celecoxib (CELEBREX) capsule 200 mg  200 mg Oral Q12H Lanney Gins, PA-C   200 mg at 03/08/15 2237  . colchicine tablet 0.3 mg  0.3 mg Oral QODAY Lanney Gins, PA-C      . dexamethasone (DECADRON) injection 10 mg  10 mg Intravenous Once Lanney Gins, PA-C      . diphenhydrAMINE (BENADRYL) capsule 25 mg  25 mg Oral Q6H PRN Lanney Gins, PA-C      . docusate sodium (COLACE) capsule 100 mg  100 mg Oral BID Lanney Gins, PA-C   100 mg at 03/08/15 2232  . donepezil (ARICEPT) tablet 10 mg  10 mg Oral QHS Lanney Gins, PA-C   10 mg at 03/08/15 2233  . DULoxetine (CYMBALTA) DR capsule 60 mg  60 mg Oral  QPM Lanney Gins, PA-C   60 mg at 03/08/15 2233  . enoxaparin (LOVENOX) injection 30 mg  30 mg Subcutaneous Q24H Lanney Gins, PA-C   30 mg at 03/09/15 0801  . febuxostat (ULORIC) tablet 40 mg  40 mg Oral QPM Lanney Gins, PA-C   40 mg at 03/08/15 2233  . ferrous sulfate tablet 325 mg  325 mg Oral TID PC Lanney Gins, PA-C      . hydrALAZINE (APRESOLINE) tablet 10 mg  10 mg Oral TID Lanney Gins, PA-C   10 mg at 03/08/15 2233  . HYDROcodone-acetaminophen (NORCO) 7.5-325 MG per tablet 1-2 tablet  1-2 tablet Oral Q4H Lanney Gins, PA-C   2 tablet at 03/09/15 9604  . HYDROmorphone (DILAUDID) injection 0.5-1 mg  0.5-1 mg Intravenous Q2H PRN Lanney Gins, PA-C   0.5 mg at 03/09/15 0045  . ipratropium-albuterol (DUONEB) 0.5-2.5 (3) MG/3ML nebulizer solution 3 mL  3 mL Nebulization Daily PRN Lanney Gins, PA-C      . levothyroxine (SYNTHROID, LEVOTHROID) tablet 100 mcg  100 mcg Oral QAC breakfast Lanney Gins, PA-C   100 mcg at 03/09/15 0801  . Linaclotide (LINZESS) capsule 145 mcg  145 mcg Oral QODAY Lanney Gins, PA-C      . Linaclotide (LINZESS) capsule 290 mcg  290 mcg Oral Isaac Laud, PA-C   290 mcg at 03/08/15 2232  . loratadine (CLARITIN) tablet 10 mg  10 mg Oral Daily Lanney Gins, PA-C   10 mg at 03/08/15 2238  . magnesium citrate solution 1 Bottle  1 Bottle Oral Once PRN Lanney Gins, PA-C      . memantine [facility]) tablet 10 mg  10 mg Oral BID Lanney Gins, PA-C   10 mg at 03/08/15 2233  . menthol-cetylpyridinium (CEPACOL) lozenge 3 mg  1 lozenge Oral PRN Lanney Gins, PA-C       Or  . phenol (CHLORASEPTIC) mouth spray 1 spray  1 spray Mouth/Throat PRN Lanney Gins, PA-C      . methocarbamol (ROBAXIN) tablet 500 mg  500 mg Oral Q6H PRN Lanney Gins, PA-C   500 mg at 03/09/15 0225   Or  . methocarbamol (ROBAXIN) 500 mg in dextrose 5 % 50 mL IVPB  500 mg Intravenous Q6H PRN Lanney Gins, PA-C   500 mg at 03/08/15 1936  . metoCLOPramide (REGLAN) tablet  5-10 mg  5-10 mg Oral Q8H PRN Lanney Gins, PA-C       Or  . metoCLOPramide (REGLAN) injection 5-10 mg  5-10 mg Intravenous Q8H PRN Lanney Gins, PA-C      . nitroGLYCERIN (NITROSTAT) SL tablet 0.4 mg  0.4 mg Sublingual Q5 min PRN Lanney Gins, PA-C      . omeprazole (PRILOSEC) capsule 20 mg  20 mg  Oral Daily Durene Romans, MD      . ondansetron [facility]) tablet 4 mg  4 mg Oral Q6H PRN Lanney Gins, PA-C       Or  . ondansetron [facility]) injection 4 mg  4 mg Intravenous Q6H PRN Lanney Gins, PA-C      . polyethylene glycol (MIRALAX / GLYCOLAX) packet 17 g  17 g Oral BID Lanney Gins, PA-C   17 g at 03/08/15 2234  . pravastatin (PRAVACHOL) tablet 40 mg  40 mg Oral QPM Lanney Gins, PA-C   40 mg at 03/08/15 2234  . pregabalin (LYRICA) capsule 75 mg  75 mg Oral BID Lanney Gins, PA-C   75 mg at 03/08/15 2234  . sodium chloride 0.9 % 1,000 mL with potassium chloride 10 mEq infusion   Intravenous Continuous Lanney Gins, PA-C 20 mL/hr at 03/08/15 2300    . torsemide (DEMADEX) tablet 20 mg  20 mg Oral BID Lanney Gins, PA-C   20 mg at 03/09/15 0801  . Warfarin - Pharmacist Dosing Inpatient   Does not apply q1800 Terri L Green, RPH       Facility-Administered Medications Ordered in Other Encounters  Medication Dose Route Frequency Provider Last Rate Last Dose  . sodium bicarbonate first hour bolus via infusion   Intravenous Once Pearson Grippe, MD       Do not use this list as official medication orders. Please verify with discharge summary.  Discharge Medications:   Medication List    ASK your doctor about these medications        alendronate 70 MG tablet  Commonly known as:  FOSAMAX  Take 70 mg by mouth once a week. Saturday.     amiodarone 200 MG tablet  Commonly known as:  PACERONE  Take 0.5 tablets (100 mg total) by mouth daily.     BIOTIN 5000 PO  Take 5,000 mcg by mouth every evening.     calcitRIOL 0.25 MCG capsule  Commonly known as:  ROCALTROL  Take 0.25 mcg by  mouth every morning.     carvedilol 12.5 MG tablet  Commonly known as:  COREG  Take 1 tablet by mouth 2 (two) times daily.     cetirizine 10 MG tablet  Commonly known as:  ZYRTEC  Take 10 mg by mouth every morning.     colchicine 0.6 MG tablet  Take 0.5 tablets (0.3 mg total) by mouth daily.     cycloSPORINE 0.05 % ophthalmic emulsion  Commonly known as:  RESTASIS  Place 1 drop into both eyes 2 (two) times daily as needed (dry eyes.).     docusate sodium 50 MG capsule  Commonly known as:  COLACE  Take 50 mg by mouth 2 (two) times daily.     donepezil 10 MG tablet  Commonly known as:  ARICEPT  Take 10 mg by mouth at bedtime.     DULoxetine 60 MG capsule  Commonly known as:  CYMBALTA  Take 60 mg by mouth every evening.     ferrous sulfate 325 (65 FE) MG tablet  Take 1 tablet (325 mg total) by mouth 3 (three) times daily after meals.     hydrALAZINE 10 MG tablet  Commonly known as:  APRESOLINE  Take 10 mg by mouth 3 (three) times daily.     HYDROcodone-acetaminophen 5-325 MG tablet  Commonly known as:  NORCO/VICODIN  Take 0.5 tablets by mouth every 6 (six) hours as needed for moderate pain.     ipratropium-albuterol 0.5-2.5 (3)  MG/3ML Soln  Commonly known as:  DUONEB  Take 3 mLs by nebulization daily as needed.     levothyroxine 100 MCG tablet  Commonly known as:  SYNTHROID, LEVOTHROID  Take 100 mcg by mouth every morning.     LINZESS 145 MCG Caps capsule  Generic drug:  Linaclotide  Take 145 mcg by mouth every other day. Alternates with the 290 mg tablet in the am.     Linaclotide 290 MCG Caps capsule  Commonly known as:  LINZESS  Take 290 mcg by mouth every other day. Alternates with the 145 mg tablets in the morning.     memantine 10 MG tablet  Commonly known as:  NAMENDA  Take 10 mg by mouth 2 (two) times daily.     multivitamin tablet  Take 0.5 tablets by mouth every morning.     nitroGLYCERIN 0.4 MG SL tablet  Commonly known as:  NITROSTAT  Place  0.4 mg under the tongue every 5 (five) minutes as needed for chest pain (MAX 3 TABLETS).     NON FORMULARY  2 Liters oxygen as needed for shortness of breath     omeprazole 20 MG capsule  Commonly known as:  PRILOSEC  Take 20 mg by mouth daily.     polyethylene glycol packet  Commonly known as:  MIRALAX / GLYCOLAX  Take 17 g by mouth 2 (two) times daily.     pravastatin 40 MG tablet  Commonly known as:  PRAVACHOL  Take 40 mg by mouth every evening.     pregabalin 75 MG capsule  Commonly known as:  LYRICA  Take 75 mg by mouth 2 (two) times daily.     torsemide 20 MG tablet  Commonly known as:  DEMADEX  Take 20 mg by mouth 2 (two) times daily. PEr Dr Allena Katz - patient to not take on 8/14 nor am of 8/15     Turmeric 500 MG Caps  Take 1 capsule by mouth 2 (two) times daily.     ULORIC 40 MG tablet  Generic drug:  febuxostat  Take 40 mg by mouth every evening.     vitamin B-12 1000 MCG tablet  Commonly known as:  CYANOCOBALAMIN  Take 1,000 mcg by mouth 2 (two) times a week. Tuesday and Friday     Vitamin D 1000 UNITS capsule  Take 2,000 Units by mouth at bedtime.     warfarin 5 MG tablet  Commonly known as:  COUMADIN  Take 2.5 tablets by mouth daily.        Relevant Imaging Results:  Relevant Lab Results:  Recent Labs    Additional Information ss # [id number]  [name], Dickey Gave, LCSW

## 2015-03-09 NOTE — Clinical Social Work Note (Signed)
Clinical Social Work Assessment  Patient Details  Name: [name] MRN: [id number] Date of Birth: [birth year]  Date of referral:  03/09/15               Reason for consult:  Facility Placement, Discharge Planning                Permission sought to share information with:  Chartered certified accountant granted to share information::  Yes, Verbal Permission Granted  Name::        Agency::     Relationship::     Contact Information:     Housing/Transportation Living arrangements for the past 2 months:  Single Family Home Source of Information:  Adult Children, Patient Patient Interpreter Needed:  None Criminal Activity/Legal Involvement Pertinent to Current Situation/Hospitalization:  No - Comment as needed Significant Relationships:  Adult Children Lives with:  Adult Children Do you feel safe going back to the place where you live?   (ST Rehab needed.) Need for family participation in patient care:  Yes (Comment)  Care giving concerns:  Pt's care cannot be managed at home following hospital d/c.   Social Worker assessment / plan:  Pt hospitalized on 03/08/15 for a ORIF Patella tendon avulsion ( right ). CSW met with pt and spoke with pt's son Thera Flake ) with pt's permission to assist with d/c planning. Pt / son feel ST Rehab will be needed at d/c. They have requested [facility] for rehab. SNF has been contacted and clinicals sent for review. PT eval and recommendations are pending. CSW will continue to follow to assist with d/c planning needs.  Employment status:  Retired Nurse, adult PT Recommendations:  Not assessed at this time Information / Referral to community resources:  [place]  Patient/Family's Response to care:  Pt / son feel ST Rehab will be needed at d/c.  Patient/Family's Understanding of and Emotional Response to Diagnosis, Current Treatment, and Prognosis:  Pt / son are aware of pt's medical  status. Pt has been to [facility], in the past, for rehab and had a very good experience. Pt's mood is bright and she is motivated to work with therapy.  Emotional Assessment Appearance:  Appears stated age Attitude/Demeanor/Rapport:  Other (cooperative) Affect (typically observed):  Pleasant, Happy Orientation:  Oriented to Self, Oriented to Place, Oriented to  Time, Oriented to Situation Alcohol / Substance use:  Not Applicable Psych involvement (Current and /or in the community):  No (Comment)  Discharge Needs  Concerns to be addressed:  Discharge Planning Concerns Readmission within the last 30 days:  No Current discharge risk:  None Barriers to Discharge:  No Barriers Identified   Luretha Rued, [place] 03/09/2015, 9:41 AM

## 2015-03-09 NOTE — Evaluation (Signed)
Occupational Therapy Evaluation Patient Details Name: [name] MRN: [id number] DOB: 23-Jun-1932 Today's Date: 03/09/2015    History of Present Illness pt was admitted for avulsion of R patellar tendon after a fall.  She is s/p ORIF.  PMH significant for stroke, HTN, R TKA   Clinical Impression   This 81 year old female was admitted for the above. She was mod I with adls prior to admission.  Pt currently needs max A for LB dressing and min A for SPT. She will benefit from skilled OT to increase safety and independent with adls. Goals in acute are for min guard.    Follow Up Recommendations  SNF    Equipment Recommendations  None recommended by OT    Recommendations for Other Services       Precautions / Restrictions Precautions Precautions: Fall Required Braces or Orthoses: Other Brace/Splint (R bledsloe brace at all times) Restrictions Weight Bearing Restrictions: No      Mobility Bed Mobility               General bed mobility comments: oob  Transfers Overall transfer level: Needs assistance Equipment used: Rolling walker (2 wheeled) Transfers: Sit to/from [employer] Sit to Stand: Min assist Stand pivot transfers: Min assist       General transfer comment: assist to rise and steady    Balance                                            ADL Overall ADL's : Needs assistance/impaired     Grooming: Set up;Sitting;Wash/dry hands   Upper Body Bathing: Set up;Sitting   Lower Body Bathing: Moderate assistance;Sit to/from stand   Upper Body Dressing : Set up;Sitting   Lower Body Dressing: Maximal assistance;Sit to/from stand   Toilet Transfer: Minimal assistance;Stand-pivot;BSC;RW   Toileting- Clothing Manipulation and Hygiene: Min guard;Sitting/lateral lean         General ADL Comments: Educated pt on wearing bledsloe brace all the time.  Pt would benefit from using AE--did not use this session      Vision     Perception     Praxis      Pertinent Vitals/Pain Pain Assessment: Faces Faces Pain Scale: Hurts little more Pain Location: R knee Pain Descriptors / Indicators: Aching Pain Intervention(s): Limited activity within patient's tolerance;Monitored during session;Premedicated before session;Repositioned;Ice applied     Hand Dominance     Extremity/Trunk Assessment Upper Extremity Assessment Upper Extremity Assessment: Overall WFL for tasks assessed           Communication Communication Communication: HOH   Cognition Arousal/Alertness: Awake/alert Behavior During Therapy: WFL for tasks assessed/performed Overall Cognitive Status: No family/caregiver present to determine baseline cognitive functioning       Memory: Decreased short-term memory             General Comments       Exercises       Shoulder Instructions      Home Living Family/patient expects to be discharged to:: Skilled nursing facility                                        Prior Functioning/Environment Level of Independence: Independent with assistive device(s)  OT Diagnosis: Generalized weakness   OT Problem List: Decreased strength;Decreased activity tolerance;Decreased knowledge of use of DME or AE;Pain;Decreased cognition;Impaired balance (sitting and/or standing)   OT Treatment/Interventions: Self-care/ADL training;DME and/or AE instruction;Patient/family education;Balance training    OT Goals(Current goals can be found in the care plan section) Acute Rehab OT Goals Patient Stated Goal: get back to being independent  OT Goal Formulation: With patient Time For Goal Achievement: 03/16/15 Potential to Achieve Goals: Good ADL Goals Pt Will Perform Grooming: with min guard assist;standing Pt Will Perform Lower Body Bathing: with min guard assist;sit to/from stand;with adaptive equipment Pt Will Transfer to Toilet: with min guard  assist;ambulating;bedside commode Pt Will Perform Toileting - Clothing Manipulation and hygiene: with min guard assist;sit to/from stand  OT Frequency: Min 2X/week   Barriers to D/C:            Co-evaluation              End of Session    Activity Tolerance: Patient tolerated treatment well Patient left: in chair;with call bell/phone within reach;with chair alarm set   Time: 2956-2130 OT Time Calculation (min): 17 min Charges:  OT General Charges $OT Visit: 1 Procedure OT Evaluation $Initial OT Evaluation Tier I: 1 Procedure G-Codes:    Dian Laprade 03-20-15, 12:11 PM Marica Otter, OTR/L [phone] 2015-03-20

## 2015-03-10 DIAGNOSIS — I480 Paroxysmal atrial fibrillation: Secondary | ICD-10-CM

## 2015-03-10 DIAGNOSIS — I5032 Chronic diastolic (congestive) heart failure: Secondary | ICD-10-CM

## 2015-03-10 DIAGNOSIS — S86891D Other injury of other muscle(s) and tendon(s) at lower leg level, right leg, subsequent encounter: Secondary | ICD-10-CM

## 2015-03-10 DIAGNOSIS — N183 Chronic kidney disease, stage 3 (moderate): Secondary | ICD-10-CM

## 2015-03-10 LAB — CBC
HEMATOCRIT: 27.9 % — AB (ref 36.0–46.0)
HEMOGLOBIN: 8.8 g/dL — AB (ref 12.0–15.0)
MCH: 29.9 pg (ref 26.0–34.0)
MCHC: 31.5 g/dL (ref 30.0–36.0)
MCV: 94.9 fL (ref 78.0–100.0)
Platelets: 207 10*3/uL (ref 150–400)
RBC: 2.94 MIL/uL — AB (ref 3.87–5.11)
RDW: 15.1 % (ref 11.5–15.5)
WBC: 14.2 10*3/uL — ABNORMAL HIGH (ref 4.0–10.5)

## 2015-03-10 LAB — BASIC METABOLIC PANEL
Anion gap: 9 (ref 5–15)
BUN: 69 mg/dL — AB (ref 6–20)
CHLORIDE: 109 mmol/L (ref 101–111)
CO2: 25 mmol/L (ref 22–32)
Calcium: 9 mg/dL (ref 8.9–10.3)
Creatinine, Ser: 2.85 mg/dL — ABNORMAL HIGH (ref 0.44–1.00)
GFR calc Af Amer: 17 mL/min — ABNORMAL LOW (ref >60)
GFR calc non Af Amer: 15 mL/min — ABNORMAL LOW (ref >60)
Glucose, Bld: 136 mg/dL — ABNORMAL HIGH (ref 65–99)
POTASSIUM: 4.9 mmol/L (ref 3.5–5.1)
SODIUM: 143 mmol/L (ref 135–145)

## 2015-03-10 LAB — PROTIME-INR
INR: 1.83 — AB (ref 0.00–1.49)
PROTHROMBIN TIME: 21.1 s — AB (ref 11.6–15.2)

## 2015-03-10 MED ORDER — PREGABALIN 75 MG PO CAPS
75.0000 mg | ORAL_CAPSULE | Freq: Every day | ORAL | Status: DC
  Administered 2015-03-11 – 2015-03-12 (×2): 75 mg via ORAL
  Filled 2015-03-10 (×2): qty 1

## 2015-03-10 MED ORDER — LORATADINE 10 MG PO TABS
10.0000 mg | ORAL_TABLET | ORAL | Status: DC
  Administered 2015-03-12: 10 mg via ORAL
  Filled 2015-03-10: qty 1

## 2015-03-10 MED ORDER — MEMANTINE HCL 5 MG PO TABS
5.0000 mg | ORAL_TABLET | Freq: Two times a day (BID) | ORAL | Status: DC
  Administered 2015-03-10 – 2015-03-12 (×4): 5 mg via ORAL
  Filled 2015-03-10 (×5): qty 1

## 2015-03-10 MED ORDER — WARFARIN SODIUM 2 MG PO TABS
2.0000 mg | ORAL_TABLET | Freq: Once | ORAL | Status: AC
  Administered 2015-03-10: 2 mg via ORAL
  Filled 2015-03-10: qty 1

## 2015-03-10 NOTE — Progress Notes (Signed)
CSW assisting with d/c planning. Spoke with MD this am. Pt is not stable for d/c. [facility] and pt's son updated.   Cori Razor LCSW [phone]

## 2015-03-10 NOTE — Progress Notes (Addendum)
Subjective: 2 Days Post-Op Procedure(s) (LRB):  OPEN REDUCTION INTERNAL FIXATION RIGHT  PATELLA TENDON AVULSION (Right) Patient reports pain as 3 on 0-10 scale.    Objective: Vital signs in last 24 hours: Temp:  [97.2 F (36.2 C)-98.3 F (36.8 C)] 97.2 F (36.2 C) (11/19 0706) Pulse Rate:  [60-65] 65 (11/19 0706) Resp:  [14-18] 16 (11/19 0706) BP: (104-152)/(39-65) 137/57 mmHg (11/19 0706) SpO2:  [65 %-96 %] 65 % (11/19 0706)  Intake/Output from previous day: 11/18 0701 - 11/19 0700 In: 1390.7 [P.O.:720; I.V.:670.7] Out: 650 [Urine:650] Intake/Output this shift:     Recent Labs  03/07/15 1220 03/08/15 2140 03/09/15 0508 03/10/15 0350  HGB 10.7* 10.4* 9.4* 8.8*    Recent Labs  03/09/15 0508 03/10/15 0350  WBC 6.5 14.2*  RBC 3.12* 2.94*  HCT 29.1* 27.9*  PLT 186 207    Recent Labs  03/09/15 0508 03/10/15 0350  NA 142 143  K 4.3 4.9  CL 104 109  CO2 30 25  BUN 50* 69*  CREATININE 2.19* 2.85*  GLUCOSE 159* 136*  CALCIUM 9.0 9.0    Recent Labs  03/09/15 0508 03/10/15 0350  INR 1.68* 1.83*    Neurologically intact Neurovascular intact Intact pulses distally Incision: dressing C/D/I Compartment soft  Assessment/Plan: 2 Days Post-Op Procedure(s) (LRB):  OPEN REDUCTION INTERNAL FIXATION RIGHT  PATELLA TENDON AVULSION (Right) Advance diet Up with therapy Discharge to SNF  Cr 2.85. UO 650. BMET in AM Hydration continue. Check I/O  Orella Cushman C 03/10/2015, 8:24 AM Note. Not ready for D/C due to ARF. Medicine consulted.

## 2015-03-10 NOTE — Progress Notes (Signed)
Physical Therapy Treatment Patient Details Name: [name] MRN: [id number] DOB: [birth year] Today's Date: 03/10/2015    History of Present Illness pt was admitted for avulsion of R patellar tendon after a fall.  She is s/p ORIF.  PMH significant for stroke, HTN, R TKA    PT Comments    Pt will benefit from continued PT in acute setting, she is making steady progress with incr gait distance today. Continue to recommend SNF post acute   Follow Up Recommendations  SNF;Supervision for mobility/OOB     Equipment Recommendations  None recommended by PT    Recommendations for Other Services       Precautions / Restrictions Precautions Precautions: Fall Required Braces or Orthoses: Other Brace/Splint Other Brace/Splint: R knee Bledsoe brace; brace at all times; No knee flexion Restrictions Weight Bearing Restrictions: No RLE Weight Bearing: Weight bearing as tolerated    Mobility  Bed Mobility               General bed mobility comments: pt in chair  Transfers Overall transfer level: Needs assistance Equipment used: Rolling walker (2 wheeled) Transfers: Sit to/from Stand Sit to Stand: Min assist         General transfer comment: assist to rise, verbal cues for hand placement and RW position for stand to sit  Ambulation/Gait Ambulation/Gait assistance: Min assist Ambulation Distance (Feet): 40 Feet Assistive device: Rolling walker (2 wheeled) Gait Pattern/deviations: Step-to pattern;Antalgic;Trunk flexed Gait velocity: decreased   General Gait Details: VC's for RW and bil LEs sequence, correct posture and breathing; intermittent min assist to maneuver RW and with balance during turns   Information systems manager [place] (Stroke Patients Only)       Balance             Standing balance-Leahy Scale: Poor                      Cognition Arousal/Alertness: Awake/alert Behavior During Therapy: WFL for tasks  assessed/performed Overall Cognitive Status: No family/caregiver present to determine baseline cognitive functioning                      Exercises General Exercises - Lower Extremity Ankle Circles/Pumps: AROM;10 reps;Both    General Comments        Pertinent Vitals/Pain Pain Assessment: No/denies pain    Home Living                      Prior Function            PT Goals (current goals can now be found in the care plan section) Acute Rehab PT Goals Patient Stated Goal: to walk without pain PT Goal Formulation: With patient Time For Goal Achievement: 03/16/15 Potential to Achieve Goals: Good Progress towards PT goals: Progressing toward goals    Frequency  Min 6X/week    PT Plan Current plan remains appropriate    Co-evaluation             End of Session Equipment Utilized During Treatment: Gait belt;Other (comment) (R bledsoe brace) Activity Tolerance: Patient tolerated treatment well Patient left: in chair;with call bell/phone within reach;with chair alarm set     Time: 1324-4010 PT Time Calculation (min) (ACUTE ONLY): 18 min  Charges:  $Gait Training: 8-22 mins  G CodesDrucilla Chalet 03/10/2015, 11:40 AM

## 2015-03-10 NOTE — Progress Notes (Signed)
ANTICOAGULATION CONSULT NOTE - Follow-Up Consult  Pharmacy Consult for Warfarin Indication: chronic Warfarin for AVR and Afib, VTE prophylaxis post-op  Allergies  Allergen Reactions  . Nubain [Nalbuphine Hcl] Hives    Went into cardiac arrest   . Codeine Hives and Nausea Only  . Darvocet [Propoxyphene N-Acetaminophen] Nausea Only   Patient Measurements: Height:  (147.3 cm) Weight: 178 lb 9 oz (80.995 kg) IBW/kg (Calculated) : 40.9  Vital Signs: Temp: 97.2 F (36.2 C) (11/19 0706) Temp Source: Oral (11/19 0706) BP: 137/57 mmHg (11/19 0706) Pulse Rate: 65 (11/19 0706)  Labs:  Recent Labs  03/08/15 1450  03/08/15 2140 03/09/15 0508 03/10/15 0350  HGB  --   < > 10.4* 9.4* 8.8*  HCT  --   --  31.9* 29.1* 27.9*  PLT  --   --  205 186 207  LABPROT 21.1*  --   --  19.8* 21.1*  INR 1.83*  --   --  1.68* 1.83*  CREATININE  --   --  1.94* 2.19* 2.85*  < > = values in this interval not displayed. Estimated Creatinine Clearance: 14.1 mL/min (by C-G formula based on Cr of 2.85).  Medical History: Past Medical History  Diagnosis Date  . Syncope   . Symptomatic bradycardia   . Chronic lower back pain   . Nonischemic cardiomyopathy (HCC)   . HTN (hypertension)   . HLD (hyperlipidemia)   . Atrial fibrillation (HCC)     tachy-brady syndrome with <1% recurrent PAF since pacemaker placement  . H/O: stroke   . Pulmonary embolism (HCC)     HISTORY OF, the pt. had a recurrent bilateral pulmonary emboli in 2005, on warfarin therapy and at which time she under went implantation of IVC filter  . Anemia   . Dementia   . Gastroesophageal reflux disease   . Aortic stenosis 10/13/2012    Low EF, low gradient with severe aortic stenosis confirmed by dobutamine stress echocardiogram s/p TAVR 12/2012  . PONV (postoperative nausea and vomiting)   . Osteoarthritis   . Neuropathy (HCC)   . Spinal stenosis   . Symptomatic bradycardia 2012    s/p Medtronic PPM  . Chronic combined  systolic and diastolic CHF (congestive heart failure) (HCC)   . Fibromyalgia   . Presence of permanent cardiac pacemaker   . Sleep apnea     uses oxygen at night and PRN- not used since > 6 months   . Shortness of breath dyspnea     with exertion   . Pneumonia     hx of x 3   . Acute on chronic renal failure [facility])     sees Dr Allena Katz   . CKD (chronic kidney disease)   . Headache     hx of migraines   . On home oxygen therapy     patient uses at nite- 2L- has not used in > 6 months per patient  . Dysrhythmia   . Heart murmur   . Stroke (HCC)   . Asthma   . History of bronchitis   . Cataracts, bilateral   . Hard of hearing   . H/O dizziness   . Repeated falls   . Hypothyroidism   . History of kidney stones   . Urinary incontinence   . History of urinary tract infection   . H/O urinary frequency   . Urinary urgency   . History of blood transfusion   . Aortic regurgitation   . Sciatica  Assessment: 69 yoF s/p R TKA 12/04/14, needed patellar tendon rupture repair. Chronic Warfarin for AVR and Afib, concurrent Amiodarone. Last dose PTA on 11/14, baseline INR 1.83 11/17, with warfarin resumed post-op 11/17 along with Lovenox bridge starting 11/18.   Previous Warfarin dose 5mg  daily, Med Rec PTA report states 2.5 tablets daily, but patient discharged in August on 2.5mg  daily.  Today, 03/10/2015   INR 1.83, subtherapeutic but increasing  Hgb decreased to 8.8, Pltc WNL  No reported bleeding issues  Regular diet: 100% of breakfast charted this AM  Goal of Therapy:  INR 2-3 Monitor platelets by anticoagulation protocol: Yes   Plan:   Warfarin 2 mg PO x 1 today.  Continue Lovenox 30mg  SQ q24h per MD until INR =/> 2.  Daily PT/INR  Monitor CBC and for s/s of bleeding.    Greer Pickerel, PharmD, BCPS Pager: [phone] 03/10/2015 4:12 PM

## 2015-03-10 NOTE — Consult Note (Signed)
[facility] Medical Consultation  [name] ZJI:[id number] DOB: 14-May-1932 DOA: 03/08/2015 PCP: Jani Gravel, MD   Requesting physician: Dr.Beane Date of consultation: 11/19 Reason for consultation: AKI/CKD  Impression/Recommendations Principal Problem:  Avulsion of right patellar tendon -s/p TKA Per ORtho  AKI on CK D stage III -Baseline creatinine has fluctuated from 2-2.5 range, was in 3 range too  -Started taking Celebrex one week ago for back pain which is likely contributing to acute worsening, unfortunately got this morning's dose -Stop Celebrex, volume status appears even will hold IV fluids, since by mouth intake is good -Monitor I/O's, B met in a.m. -I will follow     Paroxysmal atrial fibrillation s/p AVR -Status post pacemaker for symptomatic bradycardia -Continue Coumadin per pharmacy, on low-dose Lovenox bridge as well   Chronic diastolic heart failure  -Compensated, continue torsemide  Anemia of chronic disease with acute worsening -Monitor, CBC in am  I will followup again tomorrow. Please contact me if I can be of assistance in the meanwhile. Thank you for this consultation.  Chief Complaint: knee pain  HPI: This is a very pleasant 81 year old female, with chronic kidney disease stage III baseline creatinine from 2-3, paroxysmal A. fib, history of aortic valve replacement, pacemaker for symptomatically bradycardia, chronic diastolic CHF was admitted on 11/17 due to rupture of right patellar tendon, she underwent open reduction and repair of the right patella tendon on 11/17. Postoperatively has been stable, is  eating and drinking well, her labs showed mild worsening of creatinine to 2.8 today and hence [facility] was consulted  Review of Systems: 12 system review completed and positive only for constipation and right knee pain   Past Medical History  Diagnosis Date  . Syncope   . Symptomatic bradycardia   . Chronic lower back pain   . Nonischemic  cardiomyopathy ([place])   . HTN (hypertension)   . HLD (hyperlipidemia)   . Atrial fibrillation (HCC)     tachy-brady syndrome with <1% recurrent PAF since pacemaker placement  . H/O: stroke   . Pulmonary embolism (HCC)     HISTORY OF, the pt. had a recurrent bilateral pulmonary emboli in 2005, on warfarin therapy and at which time she under went implantation of IVC filter  . Anemia   . Dementia   . Gastroesophageal reflux disease   . Aortic stenosis 10/13/2012    Low EF, low gradient with severe aortic stenosis confirmed by dobutamine stress echocardiogram s/p TAVR 12/2012  . PONV (postoperative nausea and vomiting)   . Osteoarthritis   . Neuropathy ([place])   . Spinal stenosis   . Symptomatic bradycardia 2012    s/p Medtronic PPM  . Chronic combined systolic and diastolic CHF (congestive heart failure) ([place])   . Fibromyalgia   . Presence of permanent cardiac pacemaker   . Sleep apnea     uses oxygen at night and PRN- not used since > 6 months   . Shortness of breath dyspnea     with exertion   . Pneumonia     hx of x 3   . Acute on chronic renal failure [facility])     sees Dr Posey Pronto   . CKD (chronic kidney disease)   . Headache     hx of migraines   . On home oxygen therapy     patient uses at nite- 2L- has not used in > 6 months per patient  . Dysrhythmia   . Heart murmur   . Stroke ([place])   .  Asthma   . History of bronchitis   . Cataracts, bilateral   . Hard of hearing   . H/O dizziness   . Repeated falls   . Hypothyroidism   . History of kidney stones   . Urinary incontinence   . History of urinary tract infection   . H/O urinary frequency   . Urinary urgency   . History of blood transfusion   . Aortic regurgitation   . Sciatica    Past Surgical History  Procedure Laterality Date  . Pacemaker insertion      Medtronic  . Transcatheter aortic valve replacement, transfemoral  12/21/2012    a. 73mm Edwards Sapien XT transcatheter heart valve placed via open left  transfemoral approach b. Intra-op TEE: well-seated bioprosthetic aortic valve with mean gradient 2 mmHg, trivial AI, mild MR, EF 30-35%  . Transcatheter aortic valve replacement, transfemoral N/A 12/21/2012    Procedure: TRANSCATHETER AORTIC VALVE REPLACEMENT, TRANSFEMORAL;  Surgeon: Sherren Mocha, MD;  Location: [place];  Service: Open Heart Surgery;  Laterality: N/A;  . Intraoperative transesophageal echocardiogram N/A 12/21/2012    Procedure: INTRAOPERATIVE TRANSESOPHAGEAL ECHOCARDIOGRAM;  Surgeon: Sherren Mocha, MD;  Location: [facility] OR;  Service: Open Heart Surgery;  Laterality: N/A;  . Central venous catheter insertion Left 12/21/2012    Procedure: INSERTION CENTRAL LINE ADULT;  Surgeon: Sherren Mocha, MD;  Location: [place];  Service: Open Heart Surgery;  Laterality: Left;  . Cataract extraction    . Bunionectomy    . Appendectomy    . Abdominal hysterectomy    . Thyroidectomy, partial    . Back surgery    . Knee surgery Left   . Nose surgery      X 2  . Left and right heart catheterization with coronary angiogram N/A 10/18/2012    Procedure: LEFT AND RIGHT HEART CATHETERIZATION WITH CORONARY ANGIOGRAM;  Surgeon: Sherren Mocha, MD;  Location: [facility] CATH LAB;  Service: Cardiovascular;  Laterality: N/A;  . Nasal septum surgery    . Total knee arthroplasty Right 12/04/2014    Procedure: RIGHT TOTAL  KNEE ARTHROPLASTY;  Surgeon: Paralee Cancel, MD;  Location: [facility] ORS;  Service: Orthopedics;  Laterality: Right;  . Tonsillectomy    . Cardiac catheterization    . Shoulder surgery      bilat   . Elbow surgery      bilat   . Orif patella Right 03/08/2015    Procedure:  OPEN REDUCTION INTERNAL FIXATION RIGHT  PATELLA TENDON AVULSION;  Surgeon: Paralee Cancel, MD;  Location: [facility] ORS;  Service: Orthopedics;  Laterality: Right;   Social History:  reports that she has never smoked. She has never used smokeless tobacco. She reports that she does not drink alcohol or use illicit drugs.  Allergies  Allergen  Reactions  . Nubain [Nalbuphine Hcl] Hives    Went into cardiac arrest   . Codeine Hives and Nausea Only  . Darvocet [Propoxyphene N-Acetaminophen] Nausea Only   Family History  Problem Relation Age of Onset  . Ovarian cancer Mother     Deceased  . Epilepsy Father     Deceased    Prior to Admission medications   Medication Sig Start Date End Date Taking? Authorizing Provider  alendronate (FOSAMAX) 70 MG tablet Take 70 mg by mouth once a week. Saturday.   Yes Historical Provider, MD  amiodarone (PACERONE) 200 MG tablet Take 0.5 tablets (100 mg total) by mouth daily. 01/07/13  Yes Burtis Junes, NP  BIOTIN 5000 PO Take 5,000 mcg  by mouth every evening.    Yes Historical Provider, MD  calcitRIOL (ROCALTROL) 0.25 MCG capsule Take 0.25 mcg by mouth every morning.    Yes Historical Provider, MD  carvedilol (COREG) 12.5 MG tablet Take 1 tablet by mouth 2 (two) times daily. 07/20/14  Yes Historical Provider, MD  cetirizine (ZYRTEC) 10 MG tablet Take 10 mg by mouth every morning.    Yes Historical Provider, MD  Cholecalciferol (VITAMIN D) 1000 UNITS capsule Take 2,000 Units by mouth at bedtime.    Yes Historical Provider, MD  colchicine 0.6 MG tablet Take 0.5 tablets (0.3 mg total) by mouth daily. Patient taking differently: Take 0.3 mg by mouth every other day.  12/15/14  Yes Annita Brod, MD  docusate sodium (COLACE) 50 MG capsule Take 50 mg by mouth 2 (two) times daily.    Yes Historical Provider, MD  donepezil (ARICEPT) 10 MG tablet Take 10 mg by mouth at bedtime.    Yes Historical Provider, MD  DULoxetine (CYMBALTA) 60 MG capsule Take 60 mg by mouth every evening.    Yes Historical Provider, MD  hydrALAZINE (APRESOLINE) 10 MG tablet Take 10 mg by mouth 3 (three) times daily.  11/22/12  Yes Historical Provider, MD  HYDROcodone-acetaminophen (NORCO/VICODIN) 5-325 MG per tablet Take 0.5 tablets by mouth every 6 (six) hours as needed for moderate pain.    Yes Historical Provider, MD   ipratropium-albuterol (DUONEB) 0.5-2.5 (3) MG/3ML SOLN Take 3 mLs by nebulization daily as needed.    Yes Historical Provider, MD  levothyroxine (SYNTHROID, LEVOTHROID) 100 MCG tablet Take 100 mcg by mouth every morning.    Yes Historical Provider, MD  Linaclotide Rolan Lipa) 145 MCG CAPS capsule Take 145 mcg by mouth every other day. Alternates with the 290 mg tablet in the am.   Yes Historical Provider, MD  Linaclotide 290 MCG CAPS Take 290 mcg by mouth every other day. Alternates with the 145 mg tablets in the morning.   Yes Historical Provider, MD  memantine (NAMENDA) 10 MG tablet Take 10 mg by mouth 2 (two) times daily.    Yes Historical Provider, MD  Multiple Vitamin (MULTIVITAMIN) tablet Take 0.5 tablets by mouth every morning.    Yes Historical Provider, MD  omeprazole (PRILOSEC) 20 MG capsule Take 20 mg by mouth daily.   Yes Historical Provider, MD  polyethylene glycol (MIRALAX / GLYCOLAX) packet Take 17 g by mouth 2 (two) times daily. 12/07/14  Yes Danae Orleans, PA-C  pravastatin (PRAVACHOL) 40 MG tablet Take 40 mg by mouth every evening.    Yes Historical Provider, MD  pregabalin (LYRICA) 75 MG capsule Take 75 mg by mouth 2 (two) times daily.    Yes Historical Provider, MD  torsemide (DEMADEX) 20 MG tablet Take 20 mg by mouth 2 (two) times daily. PEr Dr Posey Pronto - patient to not take on 8/14 nor am of 8/15   Yes Historical Provider, MD  Turmeric 500 MG CAPS Take 1 capsule by mouth 2 (two) times daily.   Yes Historical Provider, MD  ULORIC 40 MG tablet Take 40 mg by mouth every evening.  12/02/12  Yes Historical Provider, MD  vitamin B-12 (CYANOCOBALAMIN) 1000 MCG tablet Take 1,000 mcg by mouth 2 (two) times a week. Tuesday and Friday   Yes Historical Provider, MD  warfarin (COUMADIN) 5 MG tablet Take 2.5 tablets by mouth daily.  10/16/14  Yes Historical Provider, MD  cycloSPORINE (RESTASIS) 0.05 % ophthalmic emulsion Place 1 drop into both eyes 2 (two) times daily  as needed (dry eyes.).     Historical Provider, MD  enoxaparin (LOVENOX) 30 MG/0.3ML injection Inject 0.3 mLs (30 mg total) into the skin daily. 03/09/15   Danae Orleans, PA-C  ferrous sulfate 325 (65 FE) MG tablet Take 1 tablet (325 mg total) by mouth 3 (three) times daily after meals. 12/07/14   Danae Orleans, PA-C  HYDROcodone-acetaminophen (NORCO) 7.5-325 MG tablet Take 1-2 tablets by mouth every 4 (four) hours as needed for moderate pain. 03/09/15   Danae Orleans, PA-C  nitroGLYCERIN (NITROSTAT) 0.4 MG SL tablet Place 0.4 mg under the tongue every 5 (five) minutes as needed for chest pain (MAX 3 TABLETS).     Historical Provider, MD  NON FORMULARY 2 Liters oxygen as needed for shortness of breath    Historical Provider, MD  warfarin (COUMADIN) 2 MG tablet Take 1 tablet (2 mg total) by mouth one time only at 6 PM. 03/09/15   Danae Orleans, PA-C   Physical Exam: Blood pressure 137/57, pulse 65, temperature 97.2 F (36.2 C), temperature source Oral, resp. rate 16, height $RemoveBe'4\' 10"'lVjPJcUHW$  (1.473 m), weight 80.995 kg (178 lb 9 oz), SpO2 95 %. Filed Vitals:   03/10/15 0706  BP: 137/57  Pulse: 65  Temp: 97.2 F (36.2 C)  Resp: 16     General: Alert awake oriented 3, no distress  HEENT: PERRLA, EOMI  CVS S1-S2 regular rate rhythm rubs or gallops, faint systolic murmur appreciated  Lungs clear to auscultation bilaterally  Abdomen soft nontender with normal bowel sounds organomegaly  Extremities: Right knee immobilized  Left knee without edema  Labs on Admission:  Basic Metabolic Panel:  Recent Labs Lab 03/07/15 1220 03/08/15 2140 03/09/15 0508 03/10/15 0350  NA 141  --  142 143  K 5.3*  --  4.3 4.9  CL 104  --  104 109  CO2 31  --  30 25  GLUCOSE 108*  --  159* 136*  BUN 51*  --  50* 69*  CREATININE 2.34* 1.94* 2.19* 2.85*  CALCIUM 9.8  --  9.0 9.0   Liver Function Tests: No results for input(s): AST, ALT, ALKPHOS, BILITOT, PROT, ALBUMIN in the last 168 hours. No results for input(s): LIPASE,  AMYLASE in the last 168 hours. No results for input(s): AMMONIA in the last 168 hours. CBC:  Recent Labs Lab 03/07/15 1220 03/08/15 2140 03/09/15 0508 03/10/15 0350  WBC 7.4 8.4 6.5 14.2*  HGB 10.7* 10.4* 9.4* 8.8*  HCT 33.4* 31.9* 29.1* 27.9*  MCV 94.4 92.2 93.3 94.9  PLT 215 205 186 207   Cardiac Enzymes: No results for input(s): CKTOTAL, CKMB, CKMBINDEX, TROPONINI in the last 168 hours. BNP: Invalid input(s): POCBNP CBG: No results for input(s): GLUCAP in the last 168 hours.  Radiological Exams on Admission: Dg Knee 1-2 Views Right  03/08/2015  CLINICAL DATA:  Right patellar tendon repair.  Initial encounter. EXAM: RIGHT KNEE - 1-2 VIEW COMPARISON:  None. FINDINGS: Three fluoroscopic C-arm images are provided from the OR. These demonstrate placement of screws along the distal insertion of the patellar tendon. The patient's total knee arthroplasty is grossly unremarkable in appearance, without evidence of loosening. Scattered postoperative soft tissue air is noted at the right knee joint. IMPRESSION: Status post right patellar tendon repair. Electronically Signed   By: Garald Balding M.D.   On: 03/08/2015 18:47   Dg C-arm 1-60 Min-no Report  03/08/2015  CLINICAL DATA: surgery C-ARM 1-60 MINUTES Fluoroscopy was utilized by the requesting physician.  No radiographic interpretation.  Time spent: 14min  Kailoni Vahle [facility] Pager [phone]  If 7PM-7AM, please contact night-coverage [email or web address] Password [email or web address] 03/10/2015, 11:11 AM

## 2015-03-11 LAB — BASIC METABOLIC PANEL
Anion gap: 10 (ref 5–15)
BUN: 85 mg/dL — ABNORMAL HIGH (ref 6–20)
CHLORIDE: 108 mmol/L (ref 101–111)
CO2: 25 mmol/L (ref 22–32)
CREATININE: 2.7 mg/dL — AB (ref 0.44–1.00)
Calcium: 8.8 mg/dL — ABNORMAL LOW (ref 8.9–10.3)
GFR calc non Af Amer: 16 mL/min — ABNORMAL LOW (ref >60)
GFR, EST AFRICAN AMERICAN: 18 mL/min — AB (ref >60)
Glucose, Bld: 93 mg/dL (ref 65–99)
POTASSIUM: 4.1 mmol/L (ref 3.5–5.1)
SODIUM: 143 mmol/L (ref 135–145)

## 2015-03-11 LAB — PROTIME-INR
INR: 1.65 — AB (ref 0.00–1.49)
Prothrombin Time: 19.6 seconds — ABNORMAL HIGH (ref 11.6–15.2)

## 2015-03-11 LAB — CBC
HCT: 26.3 % — ABNORMAL LOW (ref 36.0–46.0)
Hemoglobin: 8.6 g/dL — ABNORMAL LOW (ref 12.0–15.0)
MCH: 30.2 pg (ref 26.0–34.0)
MCHC: 32.7 g/dL (ref 30.0–36.0)
MCV: 92.3 fL (ref 78.0–100.0)
PLATELETS: 190 10*3/uL (ref 150–400)
RBC: 2.85 MIL/uL — AB (ref 3.87–5.11)
RDW: 15 % (ref 11.5–15.5)
WBC: 11.1 10*3/uL — AB (ref 4.0–10.5)

## 2015-03-11 MED ORDER — WARFARIN SODIUM 4 MG PO TABS
4.0000 mg | ORAL_TABLET | Freq: Once | ORAL | Status: AC
  Administered 2015-03-11: 4 mg via ORAL
  Filled 2015-03-11: qty 1

## 2015-03-11 MED ORDER — TORSEMIDE 20 MG PO TABS
20.0000 mg | ORAL_TABLET | Freq: Every day | ORAL | Status: DC
  Filled 2015-03-11: qty 1

## 2015-03-11 NOTE — Progress Notes (Signed)
ANTICOAGULATION CONSULT NOTE - Follow-Up Consult  Pharmacy Consult for Warfarin Indication: chronic Warfarin for AVR and Afib, VTE prophylaxis post-op  Allergies  Allergen Reactions  . Nubain [Nalbuphine Hcl] Hives    Went into cardiac arrest   . Codeine Hives and Nausea Only  . Darvocet [Propoxyphene N-Acetaminophen] Nausea Only   Patient Measurements: Height: 4\' 10"  (147.3 cm) Weight: 178 lb 9 oz (80.995 kg) IBW/kg (Calculated) : 40.9  Vital Signs: Temp: 97.5 F (36.4 C) (11/20 0637) Temp Source: Oral (11/20 5670) BP: 167/59 mmHg (11/20 1112) Pulse Rate: 59 (11/20 1112)  Labs:  Recent Labs  03/09/15 0508 03/10/15 0350 03/11/15 0731  HGB 9.4* 8.8* 8.6*  HCT 29.1* 27.9* 26.3*  PLT 186 207 190  LABPROT 19.8* 21.1* 19.6*  INR 1.68* 1.83* 1.65*  CREATININE 2.19* 2.85* 2.70*   Estimated Creatinine Clearance: 14.9 mL/min (by C-G formula based on Cr of 2.7).  Medical History: Past Medical History  Diagnosis Date  . Syncope   . Symptomatic bradycardia   . Chronic lower back pain   . Nonischemic cardiomyopathy (HCC)   . HTN (hypertension)   . HLD (hyperlipidemia)   . Atrial fibrillation (HCC)     tachy-brady syndrome with <1% recurrent PAF since pacemaker placement  . H/O: stroke   . Pulmonary embolism (HCC)     HISTORY OF, the pt. had a recurrent bilateral pulmonary emboli in 2005, on warfarin therapy and at which time she under went implantation of IVC filter  . Anemia   . Dementia   . Gastroesophageal reflux disease   . Aortic stenosis 10/13/2012    Low EF, low gradient with severe aortic stenosis confirmed by dobutamine stress echocardiogram s/p TAVR 12/2012  . PONV (postoperative nausea and vomiting)   . Osteoarthritis   . Neuropathy (HCC)   . Spinal stenosis   . Symptomatic bradycardia 2012    s/p Medtronic PPM  . Chronic combined systolic and diastolic CHF (congestive heart failure) (HCC)   . Fibromyalgia   . Presence of permanent cardiac pacemaker   .  Sleep apnea     uses oxygen at night and PRN- not used since > 6 months   . Shortness of breath dyspnea     with exertion   . Pneumonia     hx of x 3   . Acute on chronic renal failure [facility])     sees Dr Allena Katz   . CKD (chronic kidney disease)   . Headache     hx of migraines   . On home oxygen therapy     patient uses at nite- 2L- has not used in > 6 months per patient  . Dysrhythmia   . Heart murmur   . Stroke (HCC)   . Asthma   . History of bronchitis   . Cataracts, bilateral   . Hard of hearing   . H/O dizziness   . Repeated falls   . Hypothyroidism   . History of kidney stones   . Urinary incontinence   . History of urinary tract infection   . H/O urinary frequency   . Urinary urgency   . History of blood transfusion   . Aortic regurgitation   . Sciatica      Assessment: 57 yoF s/p R TKA 12/04/14, needed patellar tendon rupture repair. Chronic Warfarin for AVR and Afib, concurrent Amiodarone. Last dose PTA on 11/14, baseline INR 1.83 11/17, with warfarin resumed post-op 11/17 along with Lovenox bridge starting 11/18.   Previous Warfarin  dose  daily, Med Rec PTA report states 2.5 tablets daily, but patient discharged in August on 2.5mg  daily.  Today, 03/11/2015   INR 1.65, subtherapeutic and decreased today  Hgb decreased slightly to 8.6, Pltc WNL  No reported bleeding issues  Regular diet  Goal of Therapy:  INR 2-3 Monitor platelets by anticoagulation protocol: Yes   Plan:   Warfarin 4 mg PO x 1 today.  Continue Lovenox  SQ q24h per MD until INR =/> 2.  Daily PT/INR  Monitor CBC and for s/s of bleeding.    Greer Pickerel, PharmD, BCPS Pager: [phone] 03/11/2015 1:06 PM

## 2015-03-11 NOTE — Progress Notes (Signed)
[facility] PROGRESS NOTE  LIVIANNA PETRAGLIA ZOX:[id number] DOB: [birth year] DOA: 03/08/2015 PCP: Pearson Grippe, MD  Assessment/Plan: Avulsion of right patellar tendon -s/p ORIF -Per ORtho  AKI on CK D stage III -Baseline creatinine has fluctuated from 2-2.5 range, was in 3 range too  -Started taking Celebrex one week ago for back pain which is likely contributing to acute worsening,  -Stopped Celebrex, volume status appears even  -creatinine stable -continue torsemide, dose adjusted -Bmet in am -renal dosing of meds    Paroxysmal atrial fibrillation s/p AVR -Status post pacemaker for symptomatic bradycardia -Continue Coumadin per pharmacy, on low-dose Lovenox bridge as well  Chronic diastolic heart failure  -Compensated, continue torsemide  Anemia of chronic disease with acute worsening -stable  Code Status:Full Code Family Communication: none at bedside Disposition Plan: SNF possibly tomorrow      HPI/Subjective: Feels ok, breathing fine, had BM   Objective: Filed Vitals:   03/11/15 0637  BP: 164/73  Pulse: 64  Temp: 97.5 F (36.4 C)  Resp: 16    Intake/Output Summary (Last 24 hours) at 03/11/15 0946 Last data filed at 03/11/15 0553  Gross per 24 hour  Intake    660 ml  Output   1400 ml  Net   -740 ml   Filed Weights   03/08/15 1400  Weight: 80.995 kg (178 lb 9 oz)    Exam:   General: Alert awake oriented 3, no distress  HEENT: PERRLA, EOMI  CVS S1-S2 regular rate rhythm rubs or gallops  Lungs clear to auscultation bilaterally  Abdomen soft nontender with normal bowel sounds organomegaly  Extremities: Right knee immobilized  Left knee without edema  Data Reviewed: Basic Metabolic Panel:  Recent Labs Lab 03/07/15 1220 03/08/15 2140 03/09/15 0508 03/10/15 0350 03/11/15 0731  NA 141  --  142 143 143  K 5.3*  --  4.3 4.9 4.1  CL 104  --  104 109 108  CO2 31  --  GLUCOSE 108*  --  159* 136* 93  BUN 51*  --  50*  69* 85*  CREATININE 2.34* 1.94* 2.19* 2.85* 2.70*  CALCIUM 9.8  --  9.0 9.0 8.8*   Liver Function Tests: No results for input(s): AST, ALT, ALKPHOS, BILITOT, PROT, ALBUMIN in the last 168 hours. No results for input(s): LIPASE, AMYLASE in the last 168 hours. No results for input(s): AMMONIA in the last 168 hours. CBC:  Recent Labs Lab 03/07/15 1220 03/08/15 2140 03/09/15 0508 03/10/15 0350 03/11/15 0731  WBC 7.4 8.4 6.5 14.2* 11.1*  HGB 10.7* 10.4* 9.4* 8.8* 8.6*  HCT 33.4* 31.9* 29.1* 27.9* 26.3*  MCV 94.4 92.2 93.3 94.9 92.3  PLT 215 205 186 207 190   Cardiac Enzymes: No results for input(s): CKTOTAL, CKMB, CKMBINDEX, TROPONINI in the last 168 hours. BNP (last 3 results) No results for input(s): BNP in the last 8760 hours.  ProBNP (last 3 results) No results for input(s): PROBNP in the last 8760 hours.  CBG: No results for input(s): GLUCAP in the last 168 hours.  Recent Results (from the past 240 hour(s))  Surgical pcr screen     Status: None   Collection Time: 03/07/15 12:14 PM  Result Value Ref Range Status   MRSA, PCR NEGATIVE NEGATIVE Final   Staphylococcus aureus NEGATIVE NEGATIVE Final    Comment:        The Xpert SA Assay (FDA approved for NASAL specimens in patients over 79 years of age), is one component  of a comprehensive surveillance program.  Test performance has been validated by [facility] for patients greater than or equal to 79 year old. It is not intended to diagnose infection nor to guide or monitor treatment.      Studies: No results found.  Scheduled Meds: . amiodarone  100 mg Oral Daily  . calcitRIOL  0.25 mcg Oral q morning - 10a  . carvedilol  12.5 mg Oral BID  . colchicine  0.3 mg Oral QODAY  . docusate sodium  100 mg Oral BID  . donepezil  10 mg Oral QHS  . enoxaparin (LOVENOX) injection  30 mg Subcutaneous Q24H  . febuxostat  40 mg Oral QPM  . ferrous sulfate  325 mg Oral TID PC  . hydrALAZINE  10 mg Oral TID  .  HYDROcodone-acetaminophen  1-2 tablet Oral Q4H  . levothyroxine  100 mcg Oral QAC breakfast  . Linaclotide  145 mcg Oral QODAY  . Linaclotide  290 mcg Oral QODAY  . [START ON 03/12/2015] loratadine  10 mg Oral QODAY  . memantine  5 mg Oral BID  . omeprazole  20 mg Oral Daily  . polyethylene glycol  17 g Oral BID  . pravastatin  40 mg Oral QPM  . pregabalin  75 mg Oral Daily  . [START ON 03/12/2015] torsemide  20 mg Oral Daily  . Warfarin - Pharmacist Dosing Inpatient   Does not apply q1800   Continuous Infusions:  Antibiotics Given (last 72 hours)    Date/Time Action Medication Dose Rate   03/08/15 1724 Given   ceFAZolin (ANCEF) IVPB 2 g/50 mL premix 2 g    03/10/15 0021 Given   ceFAZolin (ANCEF) IVPB 2 g/50 mL premix 2 g 100 mL/hr   03/10/15 0449 Given   ceFAZolin (ANCEF) IVPB 2 g/50 mL premix 2 g 100 mL/hr      Principal Problem:   Avulsion of right patellar tendon Active Problems:   Paroxysmal atrial fibrillation (HCC)   Chronic diastolic heart failure (HCC)   CKD (chronic kidney disease), stage III    Time spent:    [facility]  [facility] Pager [phone]. If 7PM-7AM, please contact night-coverage at [email or web address], password [facility] 03/11/2015, 9:46 AM  LOS: 3 days

## 2015-03-11 NOTE — Progress Notes (Signed)
     Subjective: 3 Days Post-Op Procedure(s) (LRB):  OPEN REDUCTION INTERNAL FIXATION RIGHT  PATELLA TENDON AVULSION (Right)   Patient reports pain as mild, pain controled.  Renal insufficiency has caused pharmacy to adjust her medications. No other events throughout the night.   Objective:   VITALS:   Filed Vitals:   03/11/15 0637  BP: 164/73  Pulse: 64  Temp: 97.5 F (36.4 C)  Resp: 16    Dorsiflexion/Plantar flexion intact Incision: dressing C/D/I No cellulitis present Compartment soft  LABS  Recent Labs  03/08/15 2140 03/09/15 0508 03/10/15 0350  HGB 10.4* 9.4* 8.8*  HCT 31.9* 29.1* 27.9*  WBC 8.4 6.5 14.2*  PLT 205 186 207     Recent Labs  03/09/15 0508 03/10/15 0350 03/11/15 0731  NA 142 143 143  K 4.3 4.9 4.1  BUN 50* 69* 85*  CREATININE 2.19* 2.85* 2.70*  GLUCOSE 159* 136* 93     Assessment/Plan: 3 Days Post-Op Procedure(s) (LRB):  OPEN REDUCTION INTERNAL FIXATION RIGHT  PATELLA TENDON AVULSION (Right) Up with therapy Discharge to SNF eventually, when ready   Obese (BMI 30-39.9) Estimated body mass index is 37.33 kg/(m^2) as calculated from the following:   Height as of this encounter: 4\' 10"  (1.473 m).   Weight as of this encounter: 80.995 kg (178 lb 9 oz). Patient also counseled that weight may inhibit the healing process Patient counseled that losing weight will help with future health issues   Acute on chronic renal insufficiency  Treatment will be hydration and observation at this time.    [name]   PAC  03/11/2015, 8:10 AM

## 2015-03-11 NOTE — Progress Notes (Signed)
Physical Therapy Treatment Patient Details Name: [name] MRN: [id number] DOB: 10-May-1932 Today's Date: 03/11/2015    History of Present Illness pt was admitted for avulsion of R patellar tendon after a fall.  She is s/p ORIF.  PMH significant for stroke, HTN, R TKA    PT Comments    Assisted OOB to [facility] then amb to sinke to static stand long enough to brush her teeth.  Amb in hallway then positioned in recliner.    Follow Up Recommendations  SNF     Equipment Recommendations  None recommended by PT    Recommendations for Other Services       Precautions / Restrictions Precautions Precautions: Fall Required Braces or Orthoses: Other Brace/Splint Other Brace/Splint: R knee Bledsoe brace; brace at all times; No knee flexion Restrictions Weight Bearing Restrictions: No RLE Weight Bearing: Weight bearing as tolerated    Mobility  Bed Mobility Overal bed mobility: Needs Assistance Bed Mobility: Supine to Sit     Supine to sit: Min assist     General bed mobility comments: increased time and assist with R LE off bed (support)   Transfers Overall transfer level: Needs assistance Equipment used: Rolling walker (2 wheeled) Transfers: Sit to/from Stand Sit to Stand: Min assist         General transfer comment: 25% VC's on proper tech and hand placement as well as turn completion to [employer]    Ambulation/Gait Ambulation/Gait assistance: Min assist Ambulation Distance (Feet): 38 Feet Assistive device: Rolling walker (2 wheeled) Gait Pattern/deviations: Step-to pattern;Step-through pattern;Decreased stride length;Decreased step length - right;Decreased stance time - right;Trunk flexed Gait velocity: decreased   General Gait Details: 25% VC's on safety with turns and backward gait sequencing   Stairs            Wheelchair Mobility    Modified Rankin (Stroke Patients Only)       Balance                                    Cognition  Arousal/Alertness: Awake/alert Behavior During Therapy: WFL for tasks assessed/performed Overall Cognitive Status: Within Functional Limits for tasks assessed                      Exercises      General Comments        Pertinent Vitals/Pain Pain Assessment: 0-10 Pain Score: 4  Pain Location: R knee  Pain Descriptors / Indicators: Aching Pain Intervention(s): Monitored during session;Premedicated before session;Repositioned;Ice applied    Home Living                      Prior Function            PT Goals (current goals can now be found in the care plan section) Progress towards PT goals: Progressing toward goals    Frequency  Min 6X/week    PT Plan Current plan remains appropriate    Co-evaluation             End of Session Equipment Utilized During Treatment: Gait belt;Other (comment) (R LE Bledsoe Brace) Activity Tolerance: Patient tolerated treatment well Patient left: in chair;with chair alarm set;with call bell/phone within reach     Time: 0937-1005 PT Time Calculation (min) (ACUTE ONLY): 28 min  Charges:  $Gait Training: 8-22 mins $Therapeutic Activity: 8-22 mins  G Codes:      Rica Koyanagi  PTA [facility]  Acute  Rehab Pager      [phone]

## 2015-03-12 DIAGNOSIS — D509 Iron deficiency anemia, unspecified: Secondary | ICD-10-CM | POA: Diagnosis not present

## 2015-03-12 DIAGNOSIS — N183 Chronic kidney disease, stage 3 (moderate): Secondary | ICD-10-CM | POA: Diagnosis not present

## 2015-03-12 DIAGNOSIS — M66269 Spontaneous rupture of extensor tendons, unspecified lower leg: Secondary | ICD-10-CM | POA: Diagnosis not present

## 2015-03-12 DIAGNOSIS — S86891S Other injury of other muscle(s) and tendon(s) at lower leg level, right leg, sequela: Secondary | ICD-10-CM | POA: Diagnosis not present

## 2015-03-12 DIAGNOSIS — J45909 Unspecified asthma, uncomplicated: Secondary | ICD-10-CM | POA: Diagnosis not present

## 2015-03-12 DIAGNOSIS — E785 Hyperlipidemia, unspecified: Secondary | ICD-10-CM | POA: Diagnosis not present

## 2015-03-12 DIAGNOSIS — R2681 Unsteadiness on feet: Secondary | ICD-10-CM | POA: Diagnosis not present

## 2015-03-12 DIAGNOSIS — R41841 Cognitive communication deficit: Secondary | ICD-10-CM | POA: Diagnosis not present

## 2015-03-12 DIAGNOSIS — M1712 Unilateral primary osteoarthritis, left knee: Secondary | ICD-10-CM | POA: Diagnosis not present

## 2015-03-12 DIAGNOSIS — K219 Gastro-esophageal reflux disease without esophagitis: Secondary | ICD-10-CM | POA: Diagnosis not present

## 2015-03-12 DIAGNOSIS — Z9181 History of falling: Secondary | ICD-10-CM | POA: Diagnosis not present

## 2015-03-12 DIAGNOSIS — I509 Heart failure, unspecified: Secondary | ICD-10-CM | POA: Diagnosis not present

## 2015-03-12 DIAGNOSIS — I5032 Chronic diastolic (congestive) heart failure: Secondary | ICD-10-CM | POA: Diagnosis not present

## 2015-03-12 DIAGNOSIS — R278 Other lack of coordination: Secondary | ICD-10-CM | POA: Diagnosis not present

## 2015-03-12 DIAGNOSIS — I480 Paroxysmal atrial fibrillation: Secondary | ICD-10-CM | POA: Diagnosis not present

## 2015-03-12 DIAGNOSIS — D638 Anemia in other chronic diseases classified elsewhere: Secondary | ICD-10-CM | POA: Diagnosis not present

## 2015-03-12 DIAGNOSIS — Z96651 Presence of right artificial knee joint: Secondary | ICD-10-CM | POA: Diagnosis not present

## 2015-03-12 DIAGNOSIS — M6281 Muscle weakness (generalized): Secondary | ICD-10-CM | POA: Diagnosis not present

## 2015-03-12 DIAGNOSIS — D62 Acute posthemorrhagic anemia: Secondary | ICD-10-CM | POA: Diagnosis not present

## 2015-03-12 DIAGNOSIS — Z4789 Encounter for other orthopedic aftercare: Secondary | ICD-10-CM | POA: Diagnosis not present

## 2015-03-12 DIAGNOSIS — M25569 Pain in unspecified knee: Secondary | ICD-10-CM | POA: Diagnosis not present

## 2015-03-12 DIAGNOSIS — E039 Hypothyroidism, unspecified: Secondary | ICD-10-CM | POA: Diagnosis not present

## 2015-03-12 DIAGNOSIS — S86891D Other injury of other muscle(s) and tendon(s) at lower leg level, right leg, subsequent encounter: Secondary | ICD-10-CM | POA: Diagnosis not present

## 2015-03-12 DIAGNOSIS — J309 Allergic rhinitis, unspecified: Secondary | ICD-10-CM | POA: Diagnosis not present

## 2015-03-12 DIAGNOSIS — I10 Essential (primary) hypertension: Secondary | ICD-10-CM | POA: Diagnosis not present

## 2015-03-12 DIAGNOSIS — R1312 Dysphagia, oropharyngeal phase: Secondary | ICD-10-CM | POA: Diagnosis not present

## 2015-03-12 LAB — CBC
HCT: 28.4 % — ABNORMAL LOW (ref 36.0–46.0)
HEMOGLOBIN: 9.2 g/dL — AB (ref 12.0–15.0)
MCH: 30.1 pg (ref 26.0–34.0)
MCHC: 32.4 g/dL (ref 30.0–36.0)
MCV: 92.8 fL (ref 78.0–100.0)
Platelets: 203 10*3/uL (ref 150–400)
RBC: 3.06 MIL/uL — ABNORMAL LOW (ref 3.87–5.11)
RDW: 15.5 % (ref 11.5–15.5)
WBC: 9 10*3/uL (ref 4.0–10.5)

## 2015-03-12 LAB — BASIC METABOLIC PANEL
Anion gap: 9 (ref 5–15)
BUN: 72 mg/dL — AB (ref 6–20)
CHLORIDE: 108 mmol/L (ref 101–111)
CO2: 25 mmol/L (ref 22–32)
CREATININE: 2.34 mg/dL — AB (ref 0.44–1.00)
Calcium: 9.2 mg/dL (ref 8.9–10.3)
GFR calc Af Amer: 21 mL/min — ABNORMAL LOW (ref >60)
GFR calc non Af Amer: 19 mL/min — ABNORMAL LOW (ref >60)
Glucose, Bld: 93 mg/dL (ref 65–99)
Potassium: 4.3 mmol/L (ref 3.5–5.1)
SODIUM: 142 mmol/L (ref 135–145)

## 2015-03-12 LAB — PROTIME-INR
INR: 1.99 — ABNORMAL HIGH (ref 0.00–1.49)
PROTHROMBIN TIME: 22.5 s — AB (ref 11.6–15.2)

## 2015-03-12 MED ORDER — HYDROCODONE-ACETAMINOPHEN 7.5-325 MG PO TABS: 1.0000 | ORAL_TABLET | ORAL | Status: DC | PRN

## 2015-03-12 MED ORDER — WARFARIN SODIUM 2 MG PO TABS
2.0000 mg | ORAL_TABLET | Freq: Once | ORAL | Status: DC
Start: 2015-03-12 — End: 2015-06-07

## 2015-03-12 MED ORDER — TORSEMIDE 20 MG PO TABS
20.0000 mg | ORAL_TABLET | Freq: Two times a day (BID) | ORAL | Status: DC
  Administered 2015-03-12: 20 mg via ORAL
  Filled 2015-03-12 (×3): qty 1

## 2015-03-12 MED ORDER — WARFARIN SODIUM 2.5 MG PO TABS
2.5000 mg | ORAL_TABLET | Freq: Every day | ORAL | Status: DC
  Filled 2015-03-12: qty 1

## 2015-03-12 NOTE — Progress Notes (Signed)
Pt seen and examined, remains stable, labs improved Arm swelling/bruising better Creatinine improved back to baseline Continue torsemide Stop Lovenox, continue coumadin per Pharmacy Medically stable for SNF  Zannie Cove, MD [phone]

## 2015-03-12 NOTE — Progress Notes (Addendum)
ANTICOAGULATION CONSULT NOTE - Follow-Up Consult  Pharmacy Consult for warfarin Indication: chronic warfarin for Afib w/bioprosthetic AVR; also VTE prophylaxis post-op  Allergies  Allergen Reactions  . Nubain [Nalbuphine Hcl] Hives    Went into cardiac arrest   . Codeine Hives and Nausea Only  . Darvocet [Propoxyphene N-Acetaminophen] Nausea Only   Patient Measurements: Height: 4\' 10"  (147.3 cm) Weight: 178 lb 9 oz (80.995 kg) IBW/kg (Calculated) : 40.9  Vital Signs: Temp: 97.6 F (36.4 C) (11/21 0540) Temp Source: Oral (11/21 0540) BP: 170/67 mmHg (11/21 0540) Pulse Rate: 62 (11/21 0540)  Labs:  Recent Labs  03/10/15 0350 03/11/15 0731 03/12/15 0601  HGB 8.8* 8.6* 9.2*  HCT 27.9* 26.3* 28.4*  PLT 207 190 203  LABPROT 21.1* 19.6* 22.5*  INR 1.83* 1.65* 1.99*  CREATININE 2.85* 2.70* 2.34*   Estimated Creatinine Clearance: 17.2 mL/min (by C-G formula based on Cr of 2.34).  Medical History: Past Medical History  Diagnosis Date  . Syncope   . Symptomatic bradycardia   . Chronic lower back pain   . Nonischemic cardiomyopathy (HCC)   . HTN (hypertension)   . HLD (hyperlipidemia)   . Atrial fibrillation (HCC)     tachy-brady syndrome with <1% recurrent PAF since pacemaker placement  . H/O: stroke   . Pulmonary embolism (HCC)     HISTORY OF, the pt. had a recurrent bilateral pulmonary emboli in 2005, on warfarin therapy and at which time she under went implantation of IVC filter  . Anemia   . Dementia   . Gastroesophageal reflux disease   . Aortic stenosis 10/13/2012    Low EF, low gradient with severe aortic stenosis confirmed by dobutamine stress echocardiogram s/p TAVR 12/2012  . PONV (postoperative nausea and vomiting)   . Osteoarthritis   . Neuropathy (HCC)   . Spinal stenosis   . Symptomatic bradycardia 2012    s/p [employer] PPM  . Chronic combined systolic and diastolic CHF (congestive heart failure) (HCC)   . Fibromyalgia   . Presence of permanent  cardiac pacemaker   . Sleep apnea     uses oxygen at night and PRN- not used since > 6 months   . Shortness of breath dyspnea     with exertion   . Pneumonia     hx of x 3   . Acute on chronic renal failure [facility])     sees Dr Allena Katz   . CKD (chronic kidney disease)   . Headache     hx of migraines   . On home oxygen therapy     patient uses at nite- 2L- has not used in > 6 months per patient  . Dysrhythmia   . Heart murmur   . Stroke (HCC)   . Asthma   . History of bronchitis   . Cataracts, bilateral   . Hard of hearing   . H/O dizziness   . Repeated falls   . Hypothyroidism   . History of kidney stones   . Urinary incontinence   . History of urinary tract infection   . H/O urinary frequency   . Urinary urgency   . History of blood transfusion   . Aortic regurgitation   . Sciatica      Assessment: 7 yoF s/p R TKA 12/04/14, needed patellar tendon rupture repair. Chronic warfarin for afib with bioprosthetic AVR; concurrent amiodarone noted. Last dose PTA on 11/14, baseline INR 1.83 11/17, with warfarin resumed post-op 11/17 along with Lovenox bridge starting 11/18.  Previous warfarin dose reportedly  daily, Med Rec PTA report states 2.5 tablets daily, but patient discharged in August on 2.5mg  daily.  Today, 03/12/2015   INR steadily increasing, near lower end of therapeutic range  Hgb improving, Pltc WNL  CrCl remains < 30 mL/min (CKD)  No reported bleeding issues  Regular diet  Drug interactions: amiodarone reduces warfarin dosage requirements  Goal of Therapy:  INR 2-3 Monitor platelets by anticoagulation protocol: Yes   Recommend:   Warfarin 2.5 mg PO daily at 1800  Needs close INR monitoring after discharge.  Recommend rechecking INR 2x this week, then at least weekly until therapeutic and stable, then per physician managing outpatient warfarin.  Continue Lovenox  SQ q24h per MD until INR =/> 2. (dosage adjusted for CrCl < 30 mL/min)  Daily  PT/INR  Monitor CBC and for s/s of bleeding.   Elie Goody, PharmD, BCPS Pager: [phone] 03/12/2015  7:34 AM

## 2015-03-12 NOTE — Progress Notes (Signed)
     Subjective: 4 Days Post-Op Procedure(s) (LRB):  OPEN REDUCTION INTERNAL FIXATION RIGHT  PATELLA TENDON AVULSION (Right)   Patient reports pain as mild, pain controlled. Patient resting comfortably in the chair. She does have a Bledsoe brace on, locked in extension.  Ready to be discharged skilled nursing facility.  Objective:   VITALS:   Filed Vitals:   03/11/15 2114 03/12/15 0540  BP: 143/66 170/67  Pulse: 70 62  Temp: 97.9 F (36.6 C) 97.6 F (36.4 C)  Resp: 16 18    Dorsiflexion/Plantar flexion intact Incision: scant drainage No cellulitis present Compartment soft ACE bandage removed and brace placed back on the leg. Bledsoe brace is locked in extension   LABS  Recent Labs  03/10/15 0350 03/11/15 0731 03/12/15 0601  HGB 8.8* 8.6* 9.2*  HCT 27.9* 26.3* 28.4*  WBC 14.2* 11.1* 9.0  PLT 207 190 203     Recent Labs  03/10/15 0350 03/11/15 0731 03/12/15 0601  NA 143 143 142  K 4.9 4.1 4.3  BUN 69* 85* 72*  CREATININE 2.85* 2.70* 2.34*  GLUCOSE 136* 93 93     Assessment/Plan: 4 Days Post-Op Procedure(s) (LRB):  OPEN REDUCTION INTERNAL FIXATION RIGHT  PATELLA TENDON AVULSION (Right) Up with therapy Discharge to SNF  Follow up in 2 weeks at [facility]. Follow up with OLIN,Etheridge Geil D in 2 weeks.  Contact information:  [facility] [address], [address] [place] [address] [phone]     Acute on chronic renal insufficiency  Treatment will be hydration. BUN has decreased and creatinine has decreased for 2 days.     [name]   PAC  03/12/2015, 10:10 AM

## 2015-03-12 NOTE — Progress Notes (Signed)
Pt to be d/c today to [facility].   Pt and family agreeable. Confirmed plans with facility.  Plan transfer via EMS.    Leron Croak LCSWA

## 2015-03-12 NOTE — Clinical Social Work Placement (Addendum)
   CLINICAL SOCIAL WORK PLACEMENT  NOTE  Date:  03/12/2015  Patient Details  Name: [name] MRN: [id number] Date of Birth: January 26, 1933  Clinical Social Work is seeking post-discharge placement for this patient at the Skilled  Nursing Facility level of care (*CSW will initial, date and re-position this form in  chart as items are completed):      Patient/family provided with [facility] Clinical Social Work Department's list of facilities offering this level of care within the geographic area requested by the patient (or if unable, by the patient's family).  Yes   Patient/family informed of their freedom to choose among providers that offer the needed level of care, that participate in Medicare, Medicaid or managed care program needed by the patient, have an available bed and are willing to accept the patient.  No   Patient/family informed of 's ownership interest in [facility] and [facility], as well as of the fact that they are under no obligation to receive care at these facilities.  PASRR submitted to EDS on       PASRR number received on       Existing PASRR number confirmed on 03/09/15     FL2 transmitted to all facilities in geographic area requested by pt/family on 03/09/15     FL2 transmitted to all facilities within larger geographic area on       Patient informed that his/her managed care company has contracts with or will negotiate with certain facilities, including the following:            Patient/family informed of bed offers received.  Patient chooses bed at   [facility]   Physician recommends and patient chooses bed at      Patient to be transferred to  [facility]   on  . 03/12/2015  Patient to be transferred to facility by       Patient family notified on   of transfer.  Name of family member notified:        PHYSICIAN       Additional Comment:    _______________________________________________ Leron Croak 03/12/2015, 10:52 AM

## 2015-03-13 ENCOUNTER — Non-Acute Institutional Stay (SKILLED_NURSING_FACILITY): Payer: Medicare Other | Admitting: Adult Health

## 2015-03-13 ENCOUNTER — Encounter: Payer: Self-pay | Admitting: Adult Health

## 2015-03-13 DIAGNOSIS — I5032 Chronic diastolic (congestive) heart failure: Secondary | ICD-10-CM

## 2015-03-13 DIAGNOSIS — N183 Chronic kidney disease, stage 3 (moderate): Secondary | ICD-10-CM

## 2015-03-13 DIAGNOSIS — I10 Essential (primary) hypertension: Secondary | ICD-10-CM | POA: Diagnosis not present

## 2015-03-13 DIAGNOSIS — K5900 Constipation, unspecified: Secondary | ICD-10-CM | POA: Diagnosis not present

## 2015-03-13 DIAGNOSIS — S86891S Other injury of other muscle(s) and tendon(s) at lower leg level, right leg, sequela: Secondary | ICD-10-CM

## 2015-03-13 DIAGNOSIS — D62 Acute posthemorrhagic anemia: Secondary | ICD-10-CM | POA: Diagnosis not present

## 2015-03-13 DIAGNOSIS — K219 Gastro-esophageal reflux disease without esophagitis: Secondary | ICD-10-CM

## 2015-03-13 DIAGNOSIS — M1A9XX0 Chronic gout, unspecified, without tophus (tophi): Secondary | ICD-10-CM

## 2015-03-13 DIAGNOSIS — F329 Major depressive disorder, single episode, unspecified: Secondary | ICD-10-CM

## 2015-03-13 DIAGNOSIS — J309 Allergic rhinitis, unspecified: Secondary | ICD-10-CM

## 2015-03-13 DIAGNOSIS — M810 Age-related osteoporosis without current pathological fracture: Secondary | ICD-10-CM

## 2015-03-13 DIAGNOSIS — F0390 Unspecified dementia without behavioral disturbance: Secondary | ICD-10-CM | POA: Diagnosis not present

## 2015-03-13 DIAGNOSIS — E785 Hyperlipidemia, unspecified: Secondary | ICD-10-CM

## 2015-03-13 DIAGNOSIS — I480 Paroxysmal atrial fibrillation: Secondary | ICD-10-CM | POA: Diagnosis not present

## 2015-03-13 DIAGNOSIS — F32A Depression, unspecified: Secondary | ICD-10-CM

## 2015-03-13 DIAGNOSIS — K5909 Other constipation: Secondary | ICD-10-CM

## 2015-03-13 DIAGNOSIS — E039 Hypothyroidism, unspecified: Secondary | ICD-10-CM

## 2015-03-14 ENCOUNTER — Non-Acute Institutional Stay (SKILLED_NURSING_FACILITY): Payer: Medicare Other | Admitting: Internal Medicine

## 2015-03-14 DIAGNOSIS — R2681 Unsteadiness on feet: Secondary | ICD-10-CM

## 2015-03-14 DIAGNOSIS — G629 Polyneuropathy, unspecified: Secondary | ICD-10-CM | POA: Diagnosis not present

## 2015-03-14 DIAGNOSIS — R791 Abnormal coagulation profile: Secondary | ICD-10-CM

## 2015-03-14 DIAGNOSIS — D62 Acute posthemorrhagic anemia: Secondary | ICD-10-CM

## 2015-03-14 DIAGNOSIS — G3183 Dementia with Lewy bodies: Secondary | ICD-10-CM

## 2015-03-14 DIAGNOSIS — S86891S Other injury of other muscle(s) and tendon(s) at lower leg level, right leg, sequela: Secondary | ICD-10-CM | POA: Diagnosis not present

## 2015-03-14 DIAGNOSIS — I10 Essential (primary) hypertension: Secondary | ICD-10-CM | POA: Diagnosis not present

## 2015-03-14 DIAGNOSIS — K5900 Constipation, unspecified: Secondary | ICD-10-CM

## 2015-03-14 DIAGNOSIS — I5032 Chronic diastolic (congestive) heart failure: Secondary | ICD-10-CM

## 2015-03-14 DIAGNOSIS — F0280 Dementia in other diseases classified elsewhere without behavioral disturbance: Secondary | ICD-10-CM

## 2015-03-14 DIAGNOSIS — K5909 Other constipation: Secondary | ICD-10-CM

## 2015-03-14 DIAGNOSIS — N183 Chronic kidney disease, stage 3 unspecified: Secondary | ICD-10-CM

## 2015-03-14 DIAGNOSIS — I480 Paroxysmal atrial fibrillation: Secondary | ICD-10-CM | POA: Diagnosis not present

## 2015-03-14 DIAGNOSIS — K219 Gastro-esophageal reflux disease without esophagitis: Secondary | ICD-10-CM

## 2015-03-14 NOTE — Progress Notes (Signed)
Patient ID: [name], female   DOB: 05-03-1932, 81 y.o.   MRN: [id number]     [facility]   PCP: Pearson Grippe, MD  Code Status: full code  Allergies  Allergen Reactions  . Nubain [Nalbuphine Hcl] Hives    Went into cardiac arrest   . Codeine Hives and Nausea Only  . Darvocet [Propoxyphene N-Acetaminophen] Nausea Only    Chief Complaint  Patient presents with  . New Admit To SNF     HPI:  81 y.o. patient is here for short term rehabilitation post hospital admission from 03/08/15-03/12/15 with rupture of right patella tendon post right TKA. She underwent ORIF. She is seen in her room today. Her pain is under control with current pain regimen. She had 2 falls yesterday per patient while trying to get out of the bathroom and second time in her room with her leg giving away. She denies hitting her head or losing consciousness. She does not recall if she hit against her knee/patella area during the process.   Review of Systems:  Constitutional: feels tired. Negative for fever, chills, diaphoresis.  HENT: Negative for headache, congestion, nasal discharge, difficulty swallowing.   Eyes: Negative for eye pain, blurred vision, double vision and discharge.  Respiratory: Negative for cough and wheezing. Positive for dyspnea with exertion.  Cardiovascular: Negative for chest pain, palpitations, leg swelling.  Gastrointestinal: Negative for heartburn, nausea, vomiting, abdominal pain. Had bowel movement yesterday. Genitourinary: Negative for dysuria, flank pain.  Musculoskeletal: Negative for back pain Skin: Negative for itching, rash.  Neurological: Negative for dizziness, tingling, focal weakness Psychiatric/Behavioral: Negative for depression   Past Medical History  Diagnosis Date  . Syncope   . Symptomatic bradycardia   . Chronic lower back pain   . Nonischemic cardiomyopathy (HCC)   . HTN (hypertension)   . HLD (hyperlipidemia)   .  Atrial fibrillation (HCC)     tachy-brady syndrome with <1% recurrent PAF since pacemaker placement  . H/O: stroke   . Pulmonary embolism (HCC)     HISTORY OF, the pt. had a recurrent bilateral pulmonary emboli in 2005, on warfarin therapy and at which time she under went implantation of IVC filter  . Anemia   . Dementia   . Gastroesophageal reflux disease   . Aortic stenosis 10/13/2012    Low EF, low gradient with severe aortic stenosis confirmed by dobutamine stress echocardiogram s/p TAVR 12/2012  . PONV (postoperative nausea and vomiting)   . Osteoarthritis   . Neuropathy (HCC)   . Spinal stenosis   . Symptomatic bradycardia 2012    s/p Medtronic PPM  . Chronic combined systolic and diastolic CHF (congestive heart failure) (HCC)   . Fibromyalgia   . Presence of permanent cardiac pacemaker   . Sleep apnea     uses oxygen at night and PRN- not used since > 6 months   . Shortness of breath dyspnea     with exertion   . Pneumonia     hx of x 3   . Acute on chronic renal failure [facility])     sees Dr Allena Katz   . CKD (chronic kidney disease)   . Headache     hx of migraines   . On home oxygen therapy     patient uses at nite- 2L- has not used in > 6 months per patient  . Dysrhythmia   . Heart murmur   . Stroke (HCC)   . Asthma   .  History of bronchitis   . Cataracts, bilateral   . Hard of hearing   . H/O dizziness   . Repeated falls   . Hypothyroidism   . History of kidney stones   . Urinary incontinence   . History of urinary tract infection   . H/O urinary frequency   . Urinary urgency   . History of blood transfusion   . Aortic regurgitation   . Sciatica    Past Surgical History  Procedure Laterality Date  . Pacemaker insertion      Medtronic  . Transcatheter aortic valve replacement, transfemoral  12/21/2012    a. 47mm Edwards Sapien XT transcatheter heart valve placed via open left transfemoral approach b. Intra-op TEE: well-seated bioprosthetic aortic valve with mean  gradient 2 mmHg, trivial AI, mild MR, EF 30-35%  . Transcatheter aortic valve replacement, transfemoral N/A 12/21/2012    Procedure: TRANSCATHETER AORTIC VALVE REPLACEMENT, TRANSFEMORAL;  Surgeon: Tonny Bollman, MD;  Location: [facility] OR;  Service: Open Heart Surgery;  Laterality: N/A;  . Intraoperative transesophageal echocardiogram N/A 12/21/2012    Procedure: INTRAOPERATIVE TRANSESOPHAGEAL ECHOCARDIOGRAM;  Surgeon: Tonny Bollman, MD;  Location: [facility] OR;  Service: Open Heart Surgery;  Laterality: N/A;  . Central venous catheter insertion Left 12/21/2012    Procedure: INSERTION CENTRAL LINE ADULT;  Surgeon: Tonny Bollman, MD;  Location: [facility] OR;  Service: Open Heart Surgery;  Laterality: Left;  . Cataract extraction    . Bunionectomy    . Appendectomy    . Abdominal hysterectomy    . Thyroidectomy, partial    . Back surgery    . Knee surgery Left   . Nose surgery      X 2  . Left and right heart catheterization with coronary angiogram N/A 10/18/2012    Procedure: LEFT AND RIGHT HEART CATHETERIZATION WITH CORONARY ANGIOGRAM;  Surgeon: Tonny Bollman, MD;  Location: [facility] CATH LAB;  Service: Cardiovascular;  Laterality: N/A;  . Nasal septum surgery    . Total knee arthroplasty Right 12/04/2014    Procedure: RIGHT TOTAL  KNEE ARTHROPLASTY;  Surgeon: Durene Romans, MD;  Location: [facility] ORS;  Service: Orthopedics;  Laterality: Right;  . Tonsillectomy    . Cardiac catheterization    . Shoulder surgery      bilat   . Elbow surgery      bilat   . Orif patella Right 03/08/2015    Procedure:  OPEN REDUCTION INTERNAL FIXATION RIGHT  PATELLA TENDON AVULSION;  Surgeon: Durene Romans, MD;  Location: [facility] ORS;  Service: Orthopedics;  Laterality: Right;   Social History:   reports that she has never smoked. She has never used smokeless tobacco. She reports that she does not drink alcohol or use illicit drugs.  Family History  Problem Relation Age of Onset  . Ovarian cancer Mother     Deceased  . Epilepsy Father      Deceased    Medications:   Medication List       This list is accurate as of: 03/14/15  7:38 PM.  Always use your most recent med list.               alendronate 70 MG tablet  Commonly known as:  FOSAMAX  Take 70 mg by mouth once a week. Saturday.     amiodarone 200 MG tablet  Commonly known as:  PACERONE  Take 0.5 tablets (100 mg total) by mouth daily.     BIOTIN 5000 PO  Take 5,000 mcg by mouth every evening.  calcitRIOL 0.25 MCG capsule  Commonly known as:  ROCALTROL  Take 0.25 mcg by mouth every morning.     carvedilol 12.5 MG tablet  Commonly known as:  COREG  Take 1 tablet by mouth 2 (two) times daily.     cetirizine 10 MG tablet  Commonly known as:  ZYRTEC  Take 10 mg by mouth every morning.     colchicine 0.6 MG tablet  Take 0.5 tablets (0.3 mg total) by mouth daily.     cycloSPORINE 0.05 % ophthalmic emulsion  Commonly known as:  RESTASIS  Place 1 drop into both eyes 2 (two) times daily as needed (dry eyes.).     docusate sodium 50 MG capsule  Commonly known as:  COLACE  Take 50 mg by mouth 2 (two) times daily.     donepezil 10 MG tablet  Commonly known as:  ARICEPT  Take 10 mg by mouth at bedtime.     DULoxetine 60 MG capsule  Commonly known as:  CYMBALTA  Take 60 mg by mouth every evening.     ferrous sulfate 325 (65 FE) MG tablet  Take 1 tablet (325 mg total) by mouth 3 (three) times daily after meals.     hydrALAZINE 10 MG tablet  Commonly known as:  APRESOLINE  Take 10 mg by mouth 3 (three) times daily.     HYDROcodone-acetaminophen 7.5-325 MG tablet  Commonly known as:  NORCO  Take 1-2 tablets by mouth every 4 (four) hours as needed for moderate pain.     ipratropium-albuterol 0.5-2.5 (3) MG/3ML Soln  Commonly known as:  DUONEB  Take 3 mLs by nebulization daily as needed.     levothyroxine 100 MCG tablet  Commonly known as:  SYNTHROID, LEVOTHROID  Take 100 mcg by mouth every morning.     LINZESS 145 MCG Caps capsule    Generic drug:  Linaclotide  Take 145 mcg by mouth every other day. Alternates with the 290 mg tablet in the am.     Linaclotide 290 MCG Caps capsule  Commonly known as:  LINZESS  Take 290 mcg by mouth every other day. Alternates with the 145 mg tablets in the morning.     memantine 10 MG tablet  Commonly known as:  NAMENDA  Take 10 mg by mouth 2 (two) times daily.     multivitamin tablet  Take 0.5 tablets by mouth every morning.     nitroGLYCERIN 0.4 MG SL tablet  Commonly known as:  NITROSTAT  Place 0.4 mg under the tongue every 5 (five) minutes as needed for chest pain (MAX 3 TABLETS).     NON FORMULARY  2 Liters oxygen as needed for shortness of breath     omeprazole 20 MG capsule  Commonly known as:  PRILOSEC  Take 20 mg by mouth daily.     polyethylene glycol packet  Commonly known as:  MIRALAX / GLYCOLAX  Take 17 g by mouth 2 (two) times daily.     pravastatin 40 MG tablet  Commonly known as:  PRAVACHOL  Take 40 mg by mouth every evening.     pregabalin 75 MG capsule  Commonly known as:  LYRICA  Take 75 mg by mouth 2 (two) times daily.     torsemide 20 MG tablet  Commonly known as:  DEMADEX  Take 20 mg by mouth 2 (two) times daily. PEr Dr Allena Katz - patient to not take on 8/14 nor am of 8/15     Turmeric 500 MG Caps  Take 1 capsule by mouth 2 (two) times daily.     ULORIC 40 MG tablet  Generic drug:  febuxostat  Take 40 mg by mouth every evening.     vitamin B-12 1000 MCG tablet  Commonly known as:  CYANOCOBALAMIN  Take 1,000 mcg by mouth 2 (two) times a week. Tuesday and Friday     Vitamin D 1000 UNITS capsule  Take 2,000 Units by mouth at bedtime.     warfarin 2 MG tablet  Commonly known as:  COUMADIN  Take 1 tablet (2 mg total) by mouth one time only at 6 PM.         Physical Exam: Filed Vitals:   03/14/15 1935  BP: 130/55  Pulse: 61  Temp: 98.3 F (36.8 C)  Resp: 18  SpO2: 98%    General- elderly female, obese, in no acute  distress Head- normocephalic, atraumatic Nose- normal nasal mucosa Throat- moist mucus membrane Eyes- PERRLA, EOMI, no pallor, no icterus, no discharge, normal conjunctiva, normal sclera Neck- no cervical lymphadenopathy Cardiovascular- normal s1,s2, no murmurs, palpable dorsalis pedis and radial pulses, trace leg edema Respiratory- bilateral clear to auscultation, no wheeze, no rhonchi, no crackles, no use of accessory muscles Abdomen- bowel sounds present, soft, non tender Musculoskeletal- able to move all 4 extremities, bledsoe brace in place on right leg and is locked in extension. Left knee has soft brace Neurological- no focal deficit, alert and oriented to person, place and time Skin- warm and dry, right knee aquacel dressing Psychiatry- normal mood and affect    Labs reviewed: Basic Metabolic Panel:  Recent Labs  16/10/96 0350 03/11/15 0731 03/12/15 0601  NA 143 143 142  K 4.9 4.1 4.3  CL 109 108 108  CO2 GLUCOSE 136* 93 93  BUN 69* 85* 72*  CREATININE 2.85* 2.70* 2.34*  CALCIUM 9.0 8.8* 9.2   Liver Function Tests:  Recent Labs  12/15/14 0127  AST 48*  ALT 32  ALKPHOS 68  BILITOT 0.7  PROT 5.7*  ALBUMIN 3.2*   No results for input(s): LIPASE, AMYLASE in the last 8760 hours. No results for input(s): AMMONIA in the last 8760 hours. CBC:  Recent Labs  12/14/14 2000  03/10/15 0350 03/11/15 0731 03/12/15 0601  WBC 8.4  < > 14.2* 11.1* 9.0  NEUTROABS 5.2  --   --   --   --   HGB 8.3*  < > 8.8* 8.6* 9.2*  HCT 25.7*  < > 27.9* 26.3* 28.4*  MCV 95.9  < > 94.9 92.3 92.8  PLT 269  < > 207 190 203  < > = values in this interval not displayed. Cardiac Enzymes: No results for input(s): CKTOTAL, CKMB, CKMBINDEX, TROPONINI in the last 8760 hours. BNP: Invalid input(s): POCBNP CBG:  Recent Labs  12/07/14 1211 12/15/14 0737  GLUCAP 144* 104*    Radiological Exams: Dg Knee 1-2 Views Right  03/08/2015  CLINICAL DATA:  Right patellar tendon  repair.  Initial encounter. EXAM: RIGHT KNEE - 1-2 VIEW COMPARISON:  None. FINDINGS: Three fluoroscopic C-arm images are provided from the OR. These demonstrate placement of screws along the distal insertion of the patellar tendon. The patient's total knee arthroplasty is grossly unremarkable in appearance, without evidence of loosening. Scattered postoperative soft tissue air is noted at the right knee joint. IMPRESSION: Status post right patellar tendon repair. Electronically Signed   By: Roanna Raider M.D.   On: 03/08/2015 18:47   Dg C-arm 1-60 Min-no  Report  03/08/2015  CLINICAL DATA: surgery C-ARM 1-60 MINUTES Fluoroscopy was utilized by the requesting physician.  No radiographic interpretation.     Assessment/Plan  Unsteady gait Post patella tendon rupture and repair. Will have patient work with PT/OT as tolerated to regain strength and restore function.  Fall precautions are in place.  Right patella tendon avulsion S/p ORIF. Continue brace to right leg locked in extension at all time. Will 2 fall episodes yesterday, xray of her right Knee has been ordered. Hold therapy until the result is reviewed. Advised patient not to transfer without assistance. Will have him work with physical therapy and occupational therapy team to help with gait training and muscle strengthening exercises.fall precautions. Skin care. Encourage to be out of bed. Continue norco 7.5-325 mg 1-2 tab q4h prn pIN  Blood loss anemia Post op from blood loss. Check cbc 03/15/15 with her supratherapeutic inr of today. Continue ferrous sulfate 325 mg tid for now  supratherapeutic inr Currently on lovenox and coumadin 14.5 mg daily. inr today > 5. Give vitamin k 2.5 mg po x 1. No signs of active bleed. Discontinue lovenox and hold coumadin for now. On review of records was on coumadin 2 mg in hospital. Check inr 03/15/15. Monitor for signs of bleed and monitor vital signs  afib Rate controlled. continue coreg 12.5 mg bid  and amiodarone 100 mg daily. inr today 5.9. Holding coumadin for now and discontinuing lovenox. Check inr in am with goal inr 2-3  gerd Stable, continue prilosec 20 mg daily for now  ckd stage 3 Monitor renal function for now  Chronic constipation Currently on Colace 50 mg bid, Linzess and miralax. Hydration encouraged  Hypertension Monitor bp. Continue hydralazine 10 mg tid with coreg 12.5 mg bid for now  Dementia Continue aricept with namenda and monitor, asistance with ADLs as needed  CHF Appears euvolemic. Continue torsemide, coreg and hydralazine.monitor bmp  Neuropathy Continue Lyrica 75 mg bid and cymbalta 60 mg daily   Goals of care: short term rehabilitation   Labs/tests ordered: cbc, cmp, inr 03/15/15  Family/ staff Communication: reviewed care plan with patient and nursing supervisor    Oneal Grout, MD  [facility] [phone] (Monday-Friday 8 am - 5 pm) [phone] (afterhours)

## 2015-03-21 ENCOUNTER — Encounter: Payer: Medicare Other | Admitting: Physical Therapy

## 2015-03-23 DIAGNOSIS — M1712 Unilateral primary osteoarthritis, left knee: Secondary | ICD-10-CM | POA: Diagnosis not present

## 2015-03-26 ENCOUNTER — Encounter: Payer: Medicare Other | Admitting: Physical Therapy

## 2015-03-28 ENCOUNTER — Encounter: Payer: Medicare Other | Admitting: Physical Therapy

## 2015-03-29 ENCOUNTER — Ambulatory Visit: Payer: Medicare Other | Admitting: Physical Therapy

## 2015-03-30 ENCOUNTER — Encounter: Payer: Self-pay | Admitting: Adult Health

## 2015-03-30 ENCOUNTER — Non-Acute Institutional Stay (SKILLED_NURSING_FACILITY): Payer: Medicare Other | Admitting: Adult Health

## 2015-03-30 DIAGNOSIS — M1A9XX0 Chronic gout, unspecified, without tophus (tophi): Secondary | ICD-10-CM

## 2015-03-30 DIAGNOSIS — K5909 Other constipation: Secondary | ICD-10-CM

## 2015-03-30 DIAGNOSIS — N183 Chronic kidney disease, stage 3 unspecified: Secondary | ICD-10-CM

## 2015-03-30 DIAGNOSIS — F32A Depression, unspecified: Secondary | ICD-10-CM

## 2015-03-30 DIAGNOSIS — I480 Paroxysmal atrial fibrillation: Secondary | ICD-10-CM

## 2015-03-30 DIAGNOSIS — E039 Hypothyroidism, unspecified: Secondary | ICD-10-CM

## 2015-03-30 DIAGNOSIS — D62 Acute posthemorrhagic anemia: Secondary | ICD-10-CM | POA: Diagnosis not present

## 2015-03-30 DIAGNOSIS — E785 Hyperlipidemia, unspecified: Secondary | ICD-10-CM | POA: Diagnosis not present

## 2015-03-30 DIAGNOSIS — I10 Essential (primary) hypertension: Secondary | ICD-10-CM

## 2015-03-30 DIAGNOSIS — F0390 Unspecified dementia without behavioral disturbance: Secondary | ICD-10-CM | POA: Diagnosis not present

## 2015-03-30 DIAGNOSIS — K219 Gastro-esophageal reflux disease without esophagitis: Secondary | ICD-10-CM

## 2015-03-30 DIAGNOSIS — Z7901 Long term (current) use of anticoagulants: Secondary | ICD-10-CM

## 2015-03-30 DIAGNOSIS — K5900 Constipation, unspecified: Secondary | ICD-10-CM | POA: Diagnosis not present

## 2015-03-30 DIAGNOSIS — I5032 Chronic diastolic (congestive) heart failure: Secondary | ICD-10-CM | POA: Diagnosis not present

## 2015-03-30 DIAGNOSIS — F329 Major depressive disorder, single episode, unspecified: Secondary | ICD-10-CM | POA: Diagnosis not present

## 2015-03-30 DIAGNOSIS — S86891S Other injury of other muscle(s) and tendon(s) at lower leg level, right leg, sequela: Secondary | ICD-10-CM | POA: Diagnosis not present

## 2015-03-30 DIAGNOSIS — J309 Allergic rhinitis, unspecified: Secondary | ICD-10-CM

## 2015-03-30 DIAGNOSIS — M810 Age-related osteoporosis without current pathological fracture: Secondary | ICD-10-CM

## 2015-04-01 DIAGNOSIS — J45909 Unspecified asthma, uncomplicated: Secondary | ICD-10-CM | POA: Diagnosis not present

## 2015-04-01 DIAGNOSIS — I480 Paroxysmal atrial fibrillation: Secondary | ICD-10-CM | POA: Diagnosis not present

## 2015-04-01 DIAGNOSIS — M1990 Unspecified osteoarthritis, unspecified site: Secondary | ICD-10-CM | POA: Diagnosis not present

## 2015-04-01 DIAGNOSIS — M797 Fibromyalgia: Secondary | ICD-10-CM | POA: Diagnosis not present

## 2015-04-01 DIAGNOSIS — I130 Hypertensive heart and chronic kidney disease with heart failure and stage 1 through stage 4 chronic kidney disease, or unspecified chronic kidney disease: Secondary | ICD-10-CM | POA: Diagnosis not present

## 2015-04-01 DIAGNOSIS — I5042 Chronic combined systolic (congestive) and diastolic (congestive) heart failure: Secondary | ICD-10-CM | POA: Diagnosis not present

## 2015-04-01 DIAGNOSIS — F0390 Unspecified dementia without behavioral disturbance: Secondary | ICD-10-CM | POA: Diagnosis not present

## 2015-04-01 DIAGNOSIS — Z96651 Presence of right artificial knee joint: Secondary | ICD-10-CM | POA: Diagnosis not present

## 2015-04-01 DIAGNOSIS — Z952 Presence of prosthetic heart valve: Secondary | ICD-10-CM | POA: Diagnosis not present

## 2015-04-01 DIAGNOSIS — G4730 Sleep apnea, unspecified: Secondary | ICD-10-CM | POA: Diagnosis not present

## 2015-04-01 DIAGNOSIS — N183 Chronic kidney disease, stage 3 (moderate): Secondary | ICD-10-CM | POA: Diagnosis not present

## 2015-04-01 DIAGNOSIS — S76111D Strain of right quadriceps muscle, fascia and tendon, subsequent encounter: Secondary | ICD-10-CM | POA: Diagnosis not present

## 2015-04-01 DIAGNOSIS — G629 Polyneuropathy, unspecified: Secondary | ICD-10-CM | POA: Diagnosis not present

## 2015-04-01 NOTE — Progress Notes (Signed)
Patient ID: MARIAELENA CADE, female   DOB: 04-05-1933, 81 y.o.   MRN: [id number]    DATE:  03/30/15  MRN:  [id number]  BIRTHDAY: 05-11-1932  Facility:  Nursing Home Location:  [facility]  Nursing Home Room Number: 102-P  LEVEL OF CARE:  SNF [phone])  Contact Information    Name Relation Home Work Girard Son [phone]  [phone]   Gray,Sharon Daughter [phone]  [phone]      Chief Complaint  Patient presents with  . Discharge Note    Rupture of right patellar tendon S/P ORIF, osteoporosis, atrial fibrillation, allergic rhinitis, gout, constipation, dementia, depression, anemia, hypertension, hypothyroidism, GERD, hyperlipidemia, CKD stage 3, long-term use of anticoagulant and chronic diastolic CHF    HISTORY OF PRESENT ILLNESS:  This is an 81 year old female who is for discharge home with Home health PT, OT, CNA, Speech therapy and skilled Nurse. DME:  Standard wheelchair with cushion, removable arm rest, elevating leg rests and bedside commode. She has been admitted to [facility] on 03/12/15 from [facility]. She has PMH of CKD stage III with baseline creatinine 2-3, PAF, history of valve replacement, pacemaker and chronic diastolic CHF. She had a rupture of right patellar tendon for which she had ORIF of right patelar tendon on 03/08/15.  Patient was admitted to this facility for short-term rehabilitation after the patient's recent hospitalization.  Patient has completed SNF rehabilitation and therapy has cleared the patient for discharge.   PAST MEDICAL HISTORY:  Past Medical History  Diagnosis Date  . Syncope   . Symptomatic bradycardia   . Chronic lower back pain   . Nonischemic cardiomyopathy (HCC)   . HTN (hypertension)   . HLD (hyperlipidemia)   . Atrial fibrillation (HCC)     tachy-brady syndrome with <1% recurrent PAF since pacemaker placement  . H/O: stroke   . Pulmonary embolism (HCC)     HISTORY OF, the  pt. had a recurrent bilateral pulmonary emboli in 2005, on warfarin therapy and at which time she under went implantation of IVC filter  . Anemia   . Dementia   . Gastroesophageal reflux disease   . Aortic stenosis 10/13/2012    Low EF, low gradient with severe aortic stenosis confirmed by dobutamine stress echocardiogram s/p TAVR 12/2012  . PONV (postoperative nausea and vomiting)   . Osteoarthritis   . Neuropathy (HCC)   . Spinal stenosis   . Symptomatic bradycardia 2012    s/p Medtronic PPM  . Chronic combined systolic and diastolic CHF (congestive heart failure) (HCC)   . Fibromyalgia   . Presence of permanent cardiac pacemaker   . Sleep apnea     uses oxygen at night and PRN- not used since > 6 months   . Shortness of breath dyspnea     with exertion   . Pneumonia     hx of x 3   . Acute on chronic renal failure [facility])     sees Dr Allena Katz   . CKD (chronic kidney disease)   . Headache     hx of migraines   . On home oxygen therapy     patient uses at nite- 2L- has not used in > 6 months per patient  . Dysrhythmia   . Heart murmur   . Stroke (HCC)   . Asthma   . History of bronchitis   . Cataracts, bilateral   . Hard of hearing   . H/O dizziness   .  Repeated falls   . Hypothyroidism   . History of kidney stones   . Urinary incontinence   . History of urinary tract infection   . H/O urinary frequency   . Urinary urgency   . History of blood transfusion   . Aortic regurgitation   . Sciatica      CURRENT MEDICATIONS: Reviewed  Patient's Medications  New Prescriptions   No medications on file  Previous Medications   ALENDRONATE (FOSAMAX) 70 MG TABLET    Take 70 mg by mouth once a week. Saturday.   AMIODARONE (PACERONE) 200 MG TABLET    Take 0.5 tablets (100 mg total) by mouth daily.   BIOTIN 5000 PO    Take 5,000 mcg by mouth every evening.    CALCITRIOL (ROCALTROL) 0.25 MCG CAPSULE    Take 0.25 mcg by mouth every morning.    CARVEDILOL (COREG) 12.5 MG TABLET     Take 1 tablet by mouth 2 (two) times daily.   CETIRIZINE (ZYRTEC) 10 MG TABLET    Take 10 mg by mouth every morning.    CHOLECALCIFEROL (VITAMIN D) 1000 UNITS CAPSULE    Take 2,000 Units by mouth at bedtime.    COLCHICINE 0.6 MG TABLET    Take 0.5 tablets (0.3 mg total) by mouth daily.   CYCLOSPORINE (RESTASIS) 0.05 % OPHTHALMIC EMULSION    Place 1 drop into both eyes 2 (two) times daily as needed (dry eyes.).   DOCUSATE SODIUM (COLACE) 50 MG CAPSULE    Take 50 mg by mouth 2 (two) times daily.    DONEPEZIL (ARICEPT) 10 MG TABLET    Take 10 mg by mouth at bedtime.    DULOXETINE (CYMBALTA) 60 MG CAPSULE    Take 60 mg by mouth every evening.    FERROUS SULFATE 325 (65 FE) MG TABLET    Take 1 tablet (325 mg total) by mouth 3 (three) times daily after meals.   HYDRALAZINE (APRESOLINE) 10 MG TABLET    Take 10 mg by mouth 3 (three) times daily.    HYDROCODONE-ACETAMINOPHEN (NORCO) 7.5-325 MG TABLET    Take 1-2 tablets by mouth every 4 (four) hours as needed for moderate pain.   IPRATROPIUM-ALBUTEROL (DUONEB) 0.5-2.5 (3) MG/3ML SOLN    Take 3 mLs by nebulization daily as needed.    LEVOTHYROXINE (SYNTHROID, LEVOTHROID) 100 MCG TABLET    Take 100 mcg by mouth every morning.    LINACLOTIDE (LINZESS) 145 MCG CAPS CAPSULE    Take 145 mcg by mouth every other day. Alternates with the 290 mg tablet in the am.   LINACLOTIDE 290 MCG CAPS    Take 290 mcg by mouth every other day. Alternates with the 145 mg tablets in the morning.   MEMANTINE (NAMENDA) 10 MG TABLET    Take 10 mg by mouth 2 (two) times daily.    MULTIPLE VITAMIN (MULTIVITAMIN) TABLET    Take 0.5 tablets by mouth every morning.    NITROGLYCERIN (NITROSTAT) 0.4 MG SL TABLET    Place 0.4 mg under the tongue every 5 (five) minutes as needed for chest pain (MAX 3 TABLETS).    NON FORMULARY    2 Liters oxygen as needed for shortness of breath   OMEPRAZOLE (PRILOSEC) 20 MG CAPSULE    Take 20 mg by mouth daily.   POLYETHYLENE GLYCOL (MIRALAX / GLYCOLAX)  PACKET    Take 17 g by mouth 2 (two) times daily.   PRAVASTATIN (PRAVACHOL) 40 MG TABLET    Take 40  mg by mouth every evening.    PREGABALIN (LYRICA) 75 MG CAPSULE    Take 75 mg by mouth 2 (two) times daily.    TORSEMIDE (DEMADEX) 20 MG TABLET    Take 20 mg by mouth 2 (two) times daily. PEr Dr Allena Katz - patient to not take on 8/14 nor am of 8/15   TURMERIC 500 MG CAPS    Take 1 capsule by mouth 2 (two) times daily.   ULORIC 40 MG TABLET    Take 40 mg by mouth every evening.    VITAMIN B-12 (CYANOCOBALAMIN) 1000 MCG TABLET    Take 1,000 mcg by mouth 2 (two) times a week. Tuesday and Friday   WARFARIN (COUMADIN) 2 MG TABLET    Take 1 tablet (2 mg total) by mouth one time only at 6 PM.  Modified Medications   No medications on file  Discontinued Medications   No medications on file     Allergies  Allergen Reactions  . Nubain [Nalbuphine Hcl] Hives    Went into cardiac arrest   . Codeine Hives and Nausea Only  . Darvocet [Propoxyphene N-Acetaminophen] Nausea Only     REVIEW OF SYSTEMS:  GENERAL: no change in appetite, no fatigue, no weight changes, no fever, chills or weakness EYES: Denies change in vision, dry eyes, eye pain, itching or discharge EARS: Denies change in hearing, ringing in ears, or earache NOSE: Denies nasal congestion or epistaxis MOUTH and THROAT: Denies oral discomfort, gingival pain or bleeding, pain from teeth or hoarseness   RESPIRATORY: no cough, SOB, DOE, wheezing, hemoptysis CARDIAC: no chest pain, edema or palpitations GI: no abdominal pain, diarrhea, constipation, heart burn, nausea or vomiting GU: Denies dysuria, frequency, hematuria, incontinence, or discharge PSYCHIATRIC: Denies feeling of depression or anxiety. No report of hallucinations, insomnia, paranoia, or agitation    PHYSICAL EXAMINATION  GENERAL APPEARANCE: Well nourished. In no acute distress. Obese SKIN:  Right knee surgical incision is dry, no erythema HEAD: Normal in size and contour.  No evidence of trauma EYES: Lids open and close normally. No blepharitis, entropion or ectropion. PERRL. Conjunctivae are clear and sclerae are white. Lenses are without opacity EARS: Pinnae are normal. Patient hears normal voice tunes of the examiner MOUTH and THROAT: Lips are without lesions. Oral mucosa is moist and without lesions. Tongue is normal in shape, size, and color and without lesions NECK: supple, trachea midline, no neck masses, no thyroid tenderness, no thyromegaly LYMPHATICS: no LAN in the neck, no supraclavicular LAN RESPIRATORY: breathing is even & unlabored, BS CTAB CARDIAC: RRR, no extra heart sounds GI: abdomen soft, normal BS, no masses, no tenderness, no hepatomegaly, no splenomegaly EXTREMITIES:  Able to move X 4 extremities; RLE with bledsoe brace PSYCHIATRIC: Alert and oriented X 3. Affect and behavior are appropriate  LABS/RADIOLOGY: Labs reviewed: 03/19/15 sodium 145 potassium 4.0 glucose 86 BUN 64 creatinine 2.48 calcium 9.9 total protein 5.9 albumin 3.5 globulin 2.4 alkaline phosphatase 87 SGOT 20 SGPT 11 WBC 6.4 hemoglobin 9.8 hematocrit 33.4 MCV 98.2 platelet 289 03/14/15  right knee x-ray shows no fracture or dislocation; right tibia/fibul  shows no fracture.  Basic Metabolic Panel:  Recent Labs  16/10/96 0350 03/11/15 0731 03/12/15 0601  NA 143 143 142  K 4.9 4.1 4.3  CL 109 108 108  CO2 GLUCOSE 136* 93 93  BUN 69* 85* 72*  CREATININE 2.85* 2.70* 2.34*  CALCIUM 9.0 8.8* 9.2   Liver Function Tests:  Recent Labs  12/15/14  0127  AST 48*  ALT 32  ALKPHOS 68  BILITOT 0.7  PROT 5.7*  ALBUMIN 3.2*   CBC:  Recent Labs  12/14/14 2000  03/10/15 0350 03/11/15 0731 03/12/15 0601  WBC 8.4  < > 14.2* 11.1* 9.0  NEUTROABS 5.2  --   --   --   --   HGB 8.3*  < > 8.8* 8.6* 9.2*  HCT 25.7*  < > 27.9* 26.3* 28.4*  MCV 95.9  < > 94.9 92.3 92.8  PLT 269  < > 207 190 203  < > = values in this interval not displayed.  CBG:  Recent  Labs  12/07/14 1211 12/15/14 0737  GLUCAP 144* 104*      Dg Knee 1-2 Views Right  03/08/2015  CLINICAL DATA:  Right patellar tendon repair.  Initial encounter. EXAM: RIGHT KNEE - 1-2 VIEW COMPARISON:  None. FINDINGS: Three fluoroscopic C-arm images are provided from the OR. These demonstrate placement of screws along the distal insertion of the patellar tendon. The patient's total knee arthroplasty is grossly unremarkable in appearance, without evidence of loosening. Scattered postoperative soft tissue air is noted at the right knee joint. IMPRESSION: Status post right patellar tendon repair. Electronically Signed   By: Roanna Raider M.D.   On: 03/08/2015 18:47   Dg C-arm 1-60 Min-no Report  03/08/2015  CLINICAL DATA: surgery C-ARM 1-60 MINUTES Fluoroscopy was utilized by the requesting physician.  No radiographic interpretation.    ASSESSMENT/PLAN:  Rupture of right patellar tendon S/P ORIF - for  home health PT, OT, CNA, skilled nurse and speech therapy RLE  WBAT; continue  Coumadin 2 mg by mouth dailyfor DVT prophylaxis; Norco 7.5/325 mg 1-2 tabs by mouth every 4 hours when necessary for pain; follow-up with Dr. Charlann Boxer, orthopedic surgeon  Osteoporosis - continue Fosamax 70 mg 1 tab by mouth daily  Chronic kidney disease, stage III - recheck creatinine 2.48, baseline  Atrial fibrillation - rate controlled; continue Coumadin, Pacerone 200 mg take 1/2 tab = 100 mg by mouth daily and carvedilol 12.5 mg 1 tab by mouth twice a day  Allergic rhinitis - continue Zyrtec 10 mg 1 tab by mouth every morning  Gout - continue colchicine 0.6 mg 1/2 tab = 0.3 tab by mouth daily and Uloric 40 mg 1 tab by mouth every evening  Chronic constipation - continue Colace 50 mg 1 capsule by mouth twice a day, MiraLAX 17 g by mouth twice a day and Linzess alternating 290 mcg and 145 mcg daily  Dementia - continue Aricept 10 mg 1 tab by mouth daily at bedtime and Namenda 10 mg 1 tab by mouth twice a  day  Depression - mood is stable; continue Cymbalta 60 mg by mouth every evening  Anemia, acute blood loss - recheck hemoglobin 9.8; continue ferrous sulfate 325 mg 1 tab by mouth 3 times a day   Hypertension - well controlled; continue hydralazine 10 mg 1 tab by mouth 3 times a day and carvedilol 12.5 mg 1 tab by mouth twice a day  Hypothyroidism - continue Synthroid 100 g 1 tab by mouth daily  GERD - continue Prilosec 20 mg 1 capsule by mouth daily  Hyperlipidemia - continue Pravachol 40 mg 1 tab by mouth daily evening  Chronic diastolic CHF - continue torsemide 20 mg 1 tab by mouth twice a day  Long-term use of anticoagulant - INR 2.4, therapeutic; it was held X 1 day due to supratherapeutic INR ; decrease Coumadin to 2  mg PO ; check INR on 04/03/15    . I have filled out patient's discharge paperwork and written prescriptions.  Patient will receive home health PT, OT, ST, nursing and CNA.  DME provided:  Standard wheelchair with cushion, removable arm rest, elevating leg rests and bedside commode  Total discharge time: Greater than 30 minutes  Discharge time involved coordination of the discharge process with social worker, nursing staff and therapy department. Medical justification for home health services/DME verified.   [facility], NP [employer] [phone]

## 2015-04-01 NOTE — Progress Notes (Addendum)
Patient ID: [name], female   DOB: June 30, 1932, 81 y.o.   MRN: [id number]    DATE:  03/13/15  MRN:  [id number]  BIRTHDAY: 15-Jul-1932  Facility:  Nursing Home Location:  [facility]  Nursing Home Room Number: 102-P  LEVEL OF CARE:  SNF [phone])  Contact Information    Name Relation Home Work [place] Son [phone]  [phone]   Gray,Sharon Daughter [phone]  [phone]      Chief Complaint  Patient presents with  . Hospitalization Follow-up    Rupture of right patellar tendon S/P ORIF, osteoporosis, atrial fibrillation, allergic rhinitis, gout, constipation, dementia, depression, anemia, hypertension, hypothyroidism, GERD, hyperlipidemia, CKD stage 3 and chronic diastolic CHF    HISTORY OF PRESENT ILLNESS:  This is an 81 year old female who has been admitted to [facility] on 03/12/15 from [facility]. She has PMH of CKD stage III with baseline creatinine 2-3, PAF, history of valve replacement, pacemaker and chronic diastolic CHF. She had a rupture of right patellar tendon for which she had ORIF of right patellar tendon on 03/08/15.  She has been admitted for a short-term rehabilitation.  PAST MEDICAL HISTORY:  Past Medical History  Diagnosis Date  . Syncope   . Symptomatic bradycardia   . Chronic lower back pain   . Nonischemic cardiomyopathy (HCC)   . HTN (hypertension)   . HLD (hyperlipidemia)   . Atrial fibrillation (HCC)     tachy-brady syndrome with <1% recurrent PAF since pacemaker placement  . H/O: stroke   . Pulmonary embolism (HCC)     HISTORY OF, the pt. had a recurrent bilateral pulmonary emboli in 2005, on warfarin therapy and at which time she under went implantation of IVC filter  . Anemia   . Dementia   . Gastroesophageal reflux disease   . Aortic stenosis 10/13/2012    Low EF, low gradient with severe aortic stenosis confirmed by dobutamine stress echocardiogram s/p TAVR 12/2012  . PONV  (postoperative nausea and vomiting)   . Osteoarthritis   . Neuropathy (HCC)   . Spinal stenosis   . Symptomatic bradycardia 2012    s/p Medtronic PPM  . Chronic combined systolic and diastolic CHF (congestive heart failure) (HCC)   . Fibromyalgia   . Presence of permanent cardiac pacemaker   . Sleep apnea     uses oxygen at night and PRN- not used since > 6 months   . Shortness of breath dyspnea     with exertion   . Pneumonia     hx of x 3   . Acute on chronic renal failure [facility])     sees Dr Allena Katz   . CKD (chronic kidney disease)   . Headache     hx of migraines   . On home oxygen therapy     patient uses at nite- 2L- has not used in > 6 months per patient  . Dysrhythmia   . Heart murmur   . Stroke (HCC)   . Asthma   . History of bronchitis   . Cataracts, bilateral   . Hard of hearing   . H/O dizziness   . Repeated falls   . Hypothyroidism   . History of kidney stones   . Urinary incontinence   . History of urinary tract infection   . H/O urinary frequency   . Urinary urgency   . History of blood transfusion   . Aortic regurgitation   . Sciatica  CURRENT MEDICATIONS: Reviewed  Patient's Medications  New Prescriptions   No medications on file  Previous Medications   ALENDRONATE (FOSAMAX) 70 MG TABLET    Take 70 mg by mouth once a week. Saturday.   AMIODARONE (PACERONE) 200 MG TABLET    Take 0.5 tablets (100 mg total) by mouth daily.   BIOTIN 5000 PO    Take 5,000 mcg by mouth every evening.    CALCITRIOL (ROCALTROL) 0.25 MCG CAPSULE    Take 0.25 mcg by mouth every morning.    CARVEDILOL (COREG) 12.5 MG TABLET    Take 1 tablet by mouth 2 (two) times daily.   CETIRIZINE (ZYRTEC) 10 MG TABLET    Take 10 mg by mouth every morning.    CHOLECALCIFEROL (VITAMIN D) 1000 UNITS CAPSULE    Take 2,000 Units by mouth at bedtime.    COLCHICINE 0.6 MG TABLET    Take 0.5 tablets (0.3 mg total) by mouth daily.   CYCLOSPORINE (RESTASIS) 0.05 % OPHTHALMIC EMULSION    Place 1  drop into both eyes 2 (two) times daily as needed (dry eyes.).   DOCUSATE SODIUM (COLACE) 50 MG CAPSULE    Take 50 mg by mouth 2 (two) times daily.    DONEPEZIL (ARICEPT) 10 MG TABLET    Take 10 mg by mouth at bedtime.    DULOXETINE (CYMBALTA) 60 MG CAPSULE    Take 60 mg by mouth every evening.    FERROUS SULFATE 325 (65 FE) MG TABLET    Take 1 tablet (325 mg total) by mouth 3 (three) times daily after meals.   HYDRALAZINE (APRESOLINE) 10 MG TABLET    Take 10 mg by mouth 3 (three) times daily.    HYDROCODONE-ACETAMINOPHEN (NORCO) 7.5-325 MG TABLET    Take 1-2 tablets by mouth every 4 (four) hours as needed for moderate pain.   IPRATROPIUM-ALBUTEROL (DUONEB) 0.5-2.5 (3) MG/3ML SOLN    Take 3 mLs by nebulization daily as needed.    LEVOTHYROXINE (SYNTHROID, LEVOTHROID) 100 MCG TABLET    Take 100 mcg by mouth every morning.    LINACLOTIDE (LINZESS) 145 MCG CAPS CAPSULE    Take 145 mcg by mouth every other day. Alternates with the 290 mg tablet in the am.   LINACLOTIDE 290 MCG CAPS    Take 290 mcg by mouth every other day. Alternates with the 145 mg tablets in the morning.   MEMANTINE (NAMENDA) 10 MG TABLET    Take 10 mg by mouth 2 (two) times daily.    MULTIPLE VITAMIN (MULTIVITAMIN) TABLET    Take 0.5 tablets by mouth every morning.    NITROGLYCERIN (NITROSTAT) 0.4 MG SL TABLET    Place 0.4 mg under the tongue every 5 (five) minutes as needed for chest pain (MAX 3 TABLETS).    NON FORMULARY    2 Liters oxygen as needed for shortness of breath   OMEPRAZOLE (PRILOSEC) 20 MG CAPSULE    Take 20 mg by mouth daily.   POLYETHYLENE GLYCOL (MIRALAX / GLYCOLAX) PACKET    Take 17 g by mouth 2 (two) times daily.   PRAVASTATIN (PRAVACHOL) 40 MG TABLET    Take 40 mg by mouth every evening.    PREGABALIN (LYRICA) 75 MG CAPSULE    Take 75 mg by mouth 2 (two) times daily.    TORSEMIDE (DEMADEX) 20 MG TABLET    Take 20 mg by mouth 2 (two) times daily. PEr Dr Allena Katz - patient to not take on 8/14 nor am of 8/15  TURMERIC 500 MG CAPS    Take 1 capsule by mouth 2 (two) times daily.   ULORIC 40 MG TABLET    Take 40 mg by mouth every evening.    VITAMIN B-12 (CYANOCOBALAMIN) 1000 MCG TABLET    Take 1,000 mcg by mouth 2 (two) times a week. Tuesday and Friday   WARFARIN (COUMADIN) 2 MG TABLET    Take 1 tablet (2 mg total) by mouth one time only at 6 PM.  Modified Medications   No medications on file  Discontinued Medications   No medications on file     Allergies  Allergen Reactions  . Nubain [Nalbuphine Hcl] Hives    Went into cardiac arrest   . Codeine Hives and Nausea Only  . Darvocet [Propoxyphene N-Acetaminophen] Nausea Only     REVIEW OF SYSTEMS:  GENERAL: no change in appetite, no fatigue, no weight changes, no fever, chills or weakness EYES: Denies change in vision, dry eyes, eye pain, itching or discharge EARS: Denies change in hearing, ringing in ears, or earache NOSE: Denies nasal congestion or epistaxis MOUTH and THROAT: Denies oral discomfort, gingival pain or bleeding, pain from teeth or hoarseness   RESPIRATORY: no cough, SOB, DOE, wheezing, hemoptysis CARDIAC: no chest pain, edema or palpitations GI: no abdominal pain, diarrhea, constipation, heart burn, nausea or vomiting GU: Denies dysuria, frequency, hematuria, incontinence, or discharge PSYCHIATRIC: Denies feeling of depression or anxiety. No report of hallucinations, insomnia, paranoia, or agitation    PHYSICAL EXAMINATION  GENERAL APPEARANCE: Well nourished. In no acute distress. Obese SKIN:  Right knee surgical incision covered with aquacel dressing, no erythema HEAD: Normal in size and contour. No evidence of trauma EYES: Lids open and close normally. No blepharitis, entropion or ectropion. PERRL. Conjunctivae are clear and sclerae are white. Lenses are without opacity EARS: Pinnae are normal. Patient hears normal voice tunes of the examiner MOUTH and THROAT: Lips are without lesions. Oral mucosa is moist and  without lesions. Tongue is normal in shape, size, and color and without lesions NECK: supple, trachea midline, no neck masses, no thyroid tenderness, no thyromegaly LYMPHATICS: no LAN in the neck, no supraclavicular LAN RESPIRATORY: breathing is even & unlabored, BS CTAB CARDIAC: RRR, no extra heart sounds, no edema GI: abdomen soft, normal BS, no masses, no tenderness, no hepatomegaly, no splenomegaly EXTREMITIES:  Able to move X 4 extremities; RLE with bledsoe brace PSYCHIATRIC: Alert and oriented X 3. Affect and behavior are appropriate  LABS/RADIOLOGY: Labs reviewed: Basic Metabolic Panel:  Recent Labs  16/10/96 0350 03/11/15 0731 03/12/15 0601  NA 143 143 142  K 4.9 4.1 4.3  CL 109 108 108  CO2 GLUCOSE 136* 93 93  BUN 69* 85* 72*  CREATININE 2.85* 2.70* 2.34*  CALCIUM 9.0 8.8* 9.2   Liver Function Tests:  Recent Labs  12/15/14 0127  AST 48*  ALT 32  ALKPHOS 68  BILITOT 0.7  PROT 5.7*  ALBUMIN 3.2*   CBC:  Recent Labs  12/14/14 2000  03/10/15 0350 03/11/15 0731 03/12/15 0601  WBC 8.4  < > 14.2* 11.1* 9.0  NEUTROABS 5.2  --   --   --   --   HGB 8.3*  < > 8.8* 8.6* 9.2*  HCT 25.7*  < > 27.9* 26.3* 28.4*  MCV 95.9  < > 94.9 92.3 92.8  PLT 269  < > 207 190 203  < > = values in this interval not displayed.  CBG:  Recent Labs  12/07/14 1211 12/15/14 0737  GLUCAP 144* 104*      Dg Knee 1-2 Views Right  03/08/2015  CLINICAL DATA:  Right patellar tendon repair.  Initial encounter. EXAM: RIGHT KNEE - 1-2 VIEW COMPARISON:  None. FINDINGS: Three fluoroscopic C-arm images are provided from the OR. These demonstrate placement of screws along the distal insertion of the patellar tendon. The patient's total knee arthroplasty is grossly unremarkable in appearance, without evidence of loosening. Scattered postoperative soft tissue air is noted at the right knee joint. IMPRESSION: Status post right patellar tendon repair. Electronically Signed   By:  Roanna Raider M.D.   On: 03/08/2015 18:47   Dg C-arm 1-60 Min-no Report  03/08/2015  CLINICAL DATA: surgery C-ARM 1-60 MINUTES Fluoroscopy was utilized by the requesting physician.  No radiographic interpretation.    ASSESSMENT/PLAN:  Rupture of right patellar tendon S/P ORIF - for rehabilitation; RLE  WBAT; continue Lovenox 30 mg subcutaneous daily for DVT prophylaxis; Norco 7.5/325 mg 1-2 tabs by mouth every 4 hours when necessary for pain; follow-up with Dr. Charlann Boxer, orthopedic surgeon, in 2 weeks  Osteoporosis - continue Fosamax 70 mg 1 tab by mouth daily  Chronic kidney disease, stage III - creatinine 2.34; will monitor  Atrial fibrillation - rate controlled; continue Coumadin, Pacerone 200 mg take 1/2 tab = 100 mg by mouth daily and carvedilol 12.5 mg 1 tab by mouth twice a day  Allergic rhinitis - continue Zyrtec 10 mg 1 tab by mouth every morning  Gout - continue colchicine 0.6 mg 1 tab by mouth daily and Ulorici 40 mg 1 tab by mouth every evening  Chronic constipation - continue Colace 50 mg 1 capsule by mouth twice a day, MiraLAX 17 g by mouth twice a day and Linzess alternating 290 mcg and 145 mcg daily  Dementia - continue Aricept 10 mg 1 tab by mouth daily at bedtime and Namenda 10 mg 1 tab by mouth twice a day  Depression - mood is stable; continue Cymbalta 60 mg by mouth every evening  Anemia, acute blood loss - hemoglobin 9.2; continue ferrous sulfate 325 mg 1 tab by mouth 3 times a day   Hypertension - well controlled; continue hydralazine 10 mg 1 tab by mouth 3 times a day and carvedilol 12.5 mg 1 tab by mouth twice a day  Hypothyroidism - continue Synthroid 100 g 1 tab by mouth daily  GERD - continue Prilosec 20 mg 1 capsule by mouth daily  Hyperlipidemia - continue Pravachol 40 mg 1 tab by mouth daily evening  Chronic diastolic CHF - continue torsemide 20 mg 1 tab by mouth twice a day     Goals of care:  Short-term rehabilitation       [facility], NP [facility] [phone]

## 2015-04-02 ENCOUNTER — Encounter: Payer: Medicare Other | Admitting: Physical Therapy

## 2015-04-03 DIAGNOSIS — M1990 Unspecified osteoarthritis, unspecified site: Secondary | ICD-10-CM | POA: Diagnosis not present

## 2015-04-03 DIAGNOSIS — I130 Hypertensive heart and chronic kidney disease with heart failure and stage 1 through stage 4 chronic kidney disease, or unspecified chronic kidney disease: Secondary | ICD-10-CM | POA: Diagnosis not present

## 2015-04-03 DIAGNOSIS — S76111D Strain of right quadriceps muscle, fascia and tendon, subsequent encounter: Secondary | ICD-10-CM | POA: Diagnosis not present

## 2015-04-03 DIAGNOSIS — F0390 Unspecified dementia without behavioral disturbance: Secondary | ICD-10-CM | POA: Diagnosis not present

## 2015-04-03 DIAGNOSIS — M797 Fibromyalgia: Secondary | ICD-10-CM | POA: Diagnosis not present

## 2015-04-03 DIAGNOSIS — J45909 Unspecified asthma, uncomplicated: Secondary | ICD-10-CM | POA: Diagnosis not present

## 2015-04-03 DIAGNOSIS — I509 Heart failure, unspecified: Secondary | ICD-10-CM | POA: Diagnosis not present

## 2015-04-03 DIAGNOSIS — I5042 Chronic combined systolic (congestive) and diastolic (congestive) heart failure: Secondary | ICD-10-CM | POA: Diagnosis not present

## 2015-04-03 DIAGNOSIS — Z96651 Presence of right artificial knee joint: Secondary | ICD-10-CM | POA: Diagnosis not present

## 2015-04-03 DIAGNOSIS — N183 Chronic kidney disease, stage 3 (moderate): Secondary | ICD-10-CM | POA: Diagnosis not present

## 2015-04-03 DIAGNOSIS — G629 Polyneuropathy, unspecified: Secondary | ICD-10-CM | POA: Diagnosis not present

## 2015-04-03 DIAGNOSIS — Z952 Presence of prosthetic heart valve: Secondary | ICD-10-CM | POA: Diagnosis not present

## 2015-04-03 DIAGNOSIS — G4730 Sleep apnea, unspecified: Secondary | ICD-10-CM | POA: Diagnosis not present

## 2015-04-03 DIAGNOSIS — I480 Paroxysmal atrial fibrillation: Secondary | ICD-10-CM | POA: Diagnosis not present

## 2015-04-04 ENCOUNTER — Encounter: Payer: Medicare Other | Admitting: Physical Therapy

## 2015-04-04 DIAGNOSIS — I130 Hypertensive heart and chronic kidney disease with heart failure and stage 1 through stage 4 chronic kidney disease, or unspecified chronic kidney disease: Secondary | ICD-10-CM | POA: Diagnosis not present

## 2015-04-04 DIAGNOSIS — J45909 Unspecified asthma, uncomplicated: Secondary | ICD-10-CM | POA: Diagnosis not present

## 2015-04-04 DIAGNOSIS — I480 Paroxysmal atrial fibrillation: Secondary | ICD-10-CM | POA: Diagnosis not present

## 2015-04-04 DIAGNOSIS — F0390 Unspecified dementia without behavioral disturbance: Secondary | ICD-10-CM | POA: Diagnosis not present

## 2015-04-04 DIAGNOSIS — N183 Chronic kidney disease, stage 3 (moderate): Secondary | ICD-10-CM | POA: Diagnosis not present

## 2015-04-04 DIAGNOSIS — G4730 Sleep apnea, unspecified: Secondary | ICD-10-CM | POA: Diagnosis not present

## 2015-04-04 DIAGNOSIS — M1990 Unspecified osteoarthritis, unspecified site: Secondary | ICD-10-CM | POA: Diagnosis not present

## 2015-04-04 DIAGNOSIS — Z952 Presence of prosthetic heart valve: Secondary | ICD-10-CM | POA: Diagnosis not present

## 2015-04-04 DIAGNOSIS — S76111D Strain of right quadriceps muscle, fascia and tendon, subsequent encounter: Secondary | ICD-10-CM | POA: Diagnosis not present

## 2015-04-04 DIAGNOSIS — M797 Fibromyalgia: Secondary | ICD-10-CM | POA: Diagnosis not present

## 2015-04-04 DIAGNOSIS — Z96651 Presence of right artificial knee joint: Secondary | ICD-10-CM | POA: Diagnosis not present

## 2015-04-04 DIAGNOSIS — G629 Polyneuropathy, unspecified: Secondary | ICD-10-CM | POA: Diagnosis not present

## 2015-04-04 DIAGNOSIS — I5042 Chronic combined systolic (congestive) and diastolic (congestive) heart failure: Secondary | ICD-10-CM | POA: Diagnosis not present

## 2015-04-05 DIAGNOSIS — G629 Polyneuropathy, unspecified: Secondary | ICD-10-CM | POA: Diagnosis not present

## 2015-04-05 DIAGNOSIS — I480 Paroxysmal atrial fibrillation: Secondary | ICD-10-CM | POA: Diagnosis not present

## 2015-04-05 DIAGNOSIS — M1990 Unspecified osteoarthritis, unspecified site: Secondary | ICD-10-CM | POA: Diagnosis not present

## 2015-04-05 DIAGNOSIS — N183 Chronic kidney disease, stage 3 (moderate): Secondary | ICD-10-CM | POA: Diagnosis not present

## 2015-04-05 DIAGNOSIS — F0390 Unspecified dementia without behavioral disturbance: Secondary | ICD-10-CM | POA: Diagnosis not present

## 2015-04-05 DIAGNOSIS — S76111D Strain of right quadriceps muscle, fascia and tendon, subsequent encounter: Secondary | ICD-10-CM | POA: Diagnosis not present

## 2015-04-05 DIAGNOSIS — M797 Fibromyalgia: Secondary | ICD-10-CM | POA: Diagnosis not present

## 2015-04-05 DIAGNOSIS — J45909 Unspecified asthma, uncomplicated: Secondary | ICD-10-CM | POA: Diagnosis not present

## 2015-04-05 DIAGNOSIS — Z96651 Presence of right artificial knee joint: Secondary | ICD-10-CM | POA: Diagnosis not present

## 2015-04-05 DIAGNOSIS — I5042 Chronic combined systolic (congestive) and diastolic (congestive) heart failure: Secondary | ICD-10-CM | POA: Diagnosis not present

## 2015-04-05 DIAGNOSIS — G4730 Sleep apnea, unspecified: Secondary | ICD-10-CM | POA: Diagnosis not present

## 2015-04-05 DIAGNOSIS — I130 Hypertensive heart and chronic kidney disease with heart failure and stage 1 through stage 4 chronic kidney disease, or unspecified chronic kidney disease: Secondary | ICD-10-CM | POA: Diagnosis not present

## 2015-04-05 DIAGNOSIS — Z952 Presence of prosthetic heart valve: Secondary | ICD-10-CM | POA: Diagnosis not present

## 2015-04-06 DIAGNOSIS — N183 Chronic kidney disease, stage 3 (moderate): Secondary | ICD-10-CM | POA: Diagnosis not present

## 2015-04-06 DIAGNOSIS — J45909 Unspecified asthma, uncomplicated: Secondary | ICD-10-CM | POA: Diagnosis not present

## 2015-04-06 DIAGNOSIS — M797 Fibromyalgia: Secondary | ICD-10-CM | POA: Diagnosis not present

## 2015-04-06 DIAGNOSIS — S76111D Strain of right quadriceps muscle, fascia and tendon, subsequent encounter: Secondary | ICD-10-CM | POA: Diagnosis not present

## 2015-04-06 DIAGNOSIS — Z96651 Presence of right artificial knee joint: Secondary | ICD-10-CM | POA: Diagnosis not present

## 2015-04-06 DIAGNOSIS — G4730 Sleep apnea, unspecified: Secondary | ICD-10-CM | POA: Diagnosis not present

## 2015-04-06 DIAGNOSIS — F0390 Unspecified dementia without behavioral disturbance: Secondary | ICD-10-CM | POA: Diagnosis not present

## 2015-04-06 DIAGNOSIS — I130 Hypertensive heart and chronic kidney disease with heart failure and stage 1 through stage 4 chronic kidney disease, or unspecified chronic kidney disease: Secondary | ICD-10-CM | POA: Diagnosis not present

## 2015-04-06 DIAGNOSIS — I480 Paroxysmal atrial fibrillation: Secondary | ICD-10-CM | POA: Diagnosis not present

## 2015-04-06 DIAGNOSIS — I5042 Chronic combined systolic (congestive) and diastolic (congestive) heart failure: Secondary | ICD-10-CM | POA: Diagnosis not present

## 2015-04-06 DIAGNOSIS — M1990 Unspecified osteoarthritis, unspecified site: Secondary | ICD-10-CM | POA: Diagnosis not present

## 2015-04-06 DIAGNOSIS — Z952 Presence of prosthetic heart valve: Secondary | ICD-10-CM | POA: Diagnosis not present

## 2015-04-06 DIAGNOSIS — G629 Polyneuropathy, unspecified: Secondary | ICD-10-CM | POA: Diagnosis not present

## 2015-04-09 DIAGNOSIS — Z96651 Presence of right artificial knee joint: Secondary | ICD-10-CM | POA: Diagnosis not present

## 2015-04-09 DIAGNOSIS — G4730 Sleep apnea, unspecified: Secondary | ICD-10-CM | POA: Diagnosis not present

## 2015-04-09 DIAGNOSIS — F0390 Unspecified dementia without behavioral disturbance: Secondary | ICD-10-CM | POA: Diagnosis not present

## 2015-04-09 DIAGNOSIS — I5042 Chronic combined systolic (congestive) and diastolic (congestive) heart failure: Secondary | ICD-10-CM | POA: Diagnosis not present

## 2015-04-09 DIAGNOSIS — Z952 Presence of prosthetic heart valve: Secondary | ICD-10-CM | POA: Diagnosis not present

## 2015-04-09 DIAGNOSIS — J45909 Unspecified asthma, uncomplicated: Secondary | ICD-10-CM | POA: Diagnosis not present

## 2015-04-09 DIAGNOSIS — M797 Fibromyalgia: Secondary | ICD-10-CM | POA: Diagnosis not present

## 2015-04-09 DIAGNOSIS — S76111D Strain of right quadriceps muscle, fascia and tendon, subsequent encounter: Secondary | ICD-10-CM | POA: Diagnosis not present

## 2015-04-09 DIAGNOSIS — I130 Hypertensive heart and chronic kidney disease with heart failure and stage 1 through stage 4 chronic kidney disease, or unspecified chronic kidney disease: Secondary | ICD-10-CM | POA: Diagnosis not present

## 2015-04-09 DIAGNOSIS — M1990 Unspecified osteoarthritis, unspecified site: Secondary | ICD-10-CM | POA: Diagnosis not present

## 2015-04-09 DIAGNOSIS — G629 Polyneuropathy, unspecified: Secondary | ICD-10-CM | POA: Diagnosis not present

## 2015-04-09 DIAGNOSIS — I480 Paroxysmal atrial fibrillation: Secondary | ICD-10-CM | POA: Diagnosis not present

## 2015-04-09 DIAGNOSIS — N183 Chronic kidney disease, stage 3 (moderate): Secondary | ICD-10-CM | POA: Diagnosis not present

## 2015-04-10 DIAGNOSIS — I130 Hypertensive heart and chronic kidney disease with heart failure and stage 1 through stage 4 chronic kidney disease, or unspecified chronic kidney disease: Secondary | ICD-10-CM | POA: Diagnosis not present

## 2015-04-10 DIAGNOSIS — G4730 Sleep apnea, unspecified: Secondary | ICD-10-CM | POA: Diagnosis not present

## 2015-04-10 DIAGNOSIS — I5042 Chronic combined systolic (congestive) and diastolic (congestive) heart failure: Secondary | ICD-10-CM | POA: Diagnosis not present

## 2015-04-10 DIAGNOSIS — S76111D Strain of right quadriceps muscle, fascia and tendon, subsequent encounter: Secondary | ICD-10-CM | POA: Diagnosis not present

## 2015-04-10 DIAGNOSIS — Z96651 Presence of right artificial knee joint: Secondary | ICD-10-CM | POA: Diagnosis not present

## 2015-04-10 DIAGNOSIS — N183 Chronic kidney disease, stage 3 (moderate): Secondary | ICD-10-CM | POA: Diagnosis not present

## 2015-04-10 DIAGNOSIS — F0390 Unspecified dementia without behavioral disturbance: Secondary | ICD-10-CM | POA: Diagnosis not present

## 2015-04-10 DIAGNOSIS — M797 Fibromyalgia: Secondary | ICD-10-CM | POA: Diagnosis not present

## 2015-04-10 DIAGNOSIS — G629 Polyneuropathy, unspecified: Secondary | ICD-10-CM | POA: Diagnosis not present

## 2015-04-10 DIAGNOSIS — J45909 Unspecified asthma, uncomplicated: Secondary | ICD-10-CM | POA: Diagnosis not present

## 2015-04-10 DIAGNOSIS — Z952 Presence of prosthetic heart valve: Secondary | ICD-10-CM | POA: Diagnosis not present

## 2015-04-10 DIAGNOSIS — M1990 Unspecified osteoarthritis, unspecified site: Secondary | ICD-10-CM | POA: Diagnosis not present

## 2015-04-10 DIAGNOSIS — I480 Paroxysmal atrial fibrillation: Secondary | ICD-10-CM | POA: Diagnosis not present

## 2015-04-11 DIAGNOSIS — N183 Chronic kidney disease, stage 3 (moderate): Secondary | ICD-10-CM | POA: Diagnosis not present

## 2015-04-11 DIAGNOSIS — M797 Fibromyalgia: Secondary | ICD-10-CM | POA: Diagnosis not present

## 2015-04-11 DIAGNOSIS — I480 Paroxysmal atrial fibrillation: Secondary | ICD-10-CM | POA: Diagnosis not present

## 2015-04-11 DIAGNOSIS — F0390 Unspecified dementia without behavioral disturbance: Secondary | ICD-10-CM | POA: Diagnosis not present

## 2015-04-11 DIAGNOSIS — G4730 Sleep apnea, unspecified: Secondary | ICD-10-CM | POA: Diagnosis not present

## 2015-04-11 DIAGNOSIS — Z96651 Presence of right artificial knee joint: Secondary | ICD-10-CM | POA: Diagnosis not present

## 2015-04-11 DIAGNOSIS — M1990 Unspecified osteoarthritis, unspecified site: Secondary | ICD-10-CM | POA: Diagnosis not present

## 2015-04-11 DIAGNOSIS — J45909 Unspecified asthma, uncomplicated: Secondary | ICD-10-CM | POA: Diagnosis not present

## 2015-04-11 DIAGNOSIS — S76111D Strain of right quadriceps muscle, fascia and tendon, subsequent encounter: Secondary | ICD-10-CM | POA: Diagnosis not present

## 2015-04-11 DIAGNOSIS — I130 Hypertensive heart and chronic kidney disease with heart failure and stage 1 through stage 4 chronic kidney disease, or unspecified chronic kidney disease: Secondary | ICD-10-CM | POA: Diagnosis not present

## 2015-04-11 DIAGNOSIS — Z952 Presence of prosthetic heart valve: Secondary | ICD-10-CM | POA: Diagnosis not present

## 2015-04-11 DIAGNOSIS — G629 Polyneuropathy, unspecified: Secondary | ICD-10-CM | POA: Diagnosis not present

## 2015-04-11 DIAGNOSIS — I5042 Chronic combined systolic (congestive) and diastolic (congestive) heart failure: Secondary | ICD-10-CM | POA: Diagnosis not present

## 2015-04-12 DIAGNOSIS — M1990 Unspecified osteoarthritis, unspecified site: Secondary | ICD-10-CM | POA: Diagnosis not present

## 2015-04-12 DIAGNOSIS — F0390 Unspecified dementia without behavioral disturbance: Secondary | ICD-10-CM | POA: Diagnosis not present

## 2015-04-12 DIAGNOSIS — N183 Chronic kidney disease, stage 3 (moderate): Secondary | ICD-10-CM | POA: Diagnosis not present

## 2015-04-12 DIAGNOSIS — I480 Paroxysmal atrial fibrillation: Secondary | ICD-10-CM | POA: Diagnosis not present

## 2015-04-12 DIAGNOSIS — S76111D Strain of right quadriceps muscle, fascia and tendon, subsequent encounter: Secondary | ICD-10-CM | POA: Diagnosis not present

## 2015-04-12 DIAGNOSIS — I130 Hypertensive heart and chronic kidney disease with heart failure and stage 1 through stage 4 chronic kidney disease, or unspecified chronic kidney disease: Secondary | ICD-10-CM | POA: Diagnosis not present

## 2015-04-12 DIAGNOSIS — J45909 Unspecified asthma, uncomplicated: Secondary | ICD-10-CM | POA: Diagnosis not present

## 2015-04-12 DIAGNOSIS — I5042 Chronic combined systolic (congestive) and diastolic (congestive) heart failure: Secondary | ICD-10-CM | POA: Diagnosis not present

## 2015-04-12 DIAGNOSIS — Z96651 Presence of right artificial knee joint: Secondary | ICD-10-CM | POA: Diagnosis not present

## 2015-04-12 DIAGNOSIS — G4730 Sleep apnea, unspecified: Secondary | ICD-10-CM | POA: Diagnosis not present

## 2015-04-12 DIAGNOSIS — Z952 Presence of prosthetic heart valve: Secondary | ICD-10-CM | POA: Diagnosis not present

## 2015-04-12 DIAGNOSIS — G629 Polyneuropathy, unspecified: Secondary | ICD-10-CM | POA: Diagnosis not present

## 2015-04-12 DIAGNOSIS — M797 Fibromyalgia: Secondary | ICD-10-CM | POA: Diagnosis not present

## 2015-04-13 DIAGNOSIS — Z96651 Presence of right artificial knee joint: Secondary | ICD-10-CM | POA: Diagnosis not present

## 2015-04-13 DIAGNOSIS — G4730 Sleep apnea, unspecified: Secondary | ICD-10-CM | POA: Diagnosis not present

## 2015-04-13 DIAGNOSIS — J45909 Unspecified asthma, uncomplicated: Secondary | ICD-10-CM | POA: Diagnosis not present

## 2015-04-13 DIAGNOSIS — F0390 Unspecified dementia without behavioral disturbance: Secondary | ICD-10-CM | POA: Diagnosis not present

## 2015-04-13 DIAGNOSIS — M797 Fibromyalgia: Secondary | ICD-10-CM | POA: Diagnosis not present

## 2015-04-13 DIAGNOSIS — G629 Polyneuropathy, unspecified: Secondary | ICD-10-CM | POA: Diagnosis not present

## 2015-04-13 DIAGNOSIS — I5042 Chronic combined systolic (congestive) and diastolic (congestive) heart failure: Secondary | ICD-10-CM | POA: Diagnosis not present

## 2015-04-13 DIAGNOSIS — Z952 Presence of prosthetic heart valve: Secondary | ICD-10-CM | POA: Diagnosis not present

## 2015-04-13 DIAGNOSIS — N183 Chronic kidney disease, stage 3 (moderate): Secondary | ICD-10-CM | POA: Diagnosis not present

## 2015-04-13 DIAGNOSIS — S76111D Strain of right quadriceps muscle, fascia and tendon, subsequent encounter: Secondary | ICD-10-CM | POA: Diagnosis not present

## 2015-04-13 DIAGNOSIS — I480 Paroxysmal atrial fibrillation: Secondary | ICD-10-CM | POA: Diagnosis not present

## 2015-04-13 DIAGNOSIS — M1990 Unspecified osteoarthritis, unspecified site: Secondary | ICD-10-CM | POA: Diagnosis not present

## 2015-04-13 DIAGNOSIS — I130 Hypertensive heart and chronic kidney disease with heart failure and stage 1 through stage 4 chronic kidney disease, or unspecified chronic kidney disease: Secondary | ICD-10-CM | POA: Diagnosis not present

## 2015-04-17 ENCOUNTER — Encounter: Payer: Medicare Other | Admitting: *Deleted

## 2015-04-17 ENCOUNTER — Telehealth: Payer: Self-pay | Admitting: Cardiology

## 2015-04-17 DIAGNOSIS — F0390 Unspecified dementia without behavioral disturbance: Secondary | ICD-10-CM | POA: Diagnosis not present

## 2015-04-17 DIAGNOSIS — Z952 Presence of prosthetic heart valve: Secondary | ICD-10-CM | POA: Diagnosis not present

## 2015-04-17 DIAGNOSIS — I5042 Chronic combined systolic (congestive) and diastolic (congestive) heart failure: Secondary | ICD-10-CM | POA: Diagnosis not present

## 2015-04-17 DIAGNOSIS — I130 Hypertensive heart and chronic kidney disease with heart failure and stage 1 through stage 4 chronic kidney disease, or unspecified chronic kidney disease: Secondary | ICD-10-CM | POA: Diagnosis not present

## 2015-04-17 DIAGNOSIS — Z96651 Presence of right artificial knee joint: Secondary | ICD-10-CM | POA: Diagnosis not present

## 2015-04-17 DIAGNOSIS — G4730 Sleep apnea, unspecified: Secondary | ICD-10-CM | POA: Diagnosis not present

## 2015-04-17 DIAGNOSIS — S76111D Strain of right quadriceps muscle, fascia and tendon, subsequent encounter: Secondary | ICD-10-CM | POA: Diagnosis not present

## 2015-04-17 DIAGNOSIS — I480 Paroxysmal atrial fibrillation: Secondary | ICD-10-CM | POA: Diagnosis not present

## 2015-04-17 DIAGNOSIS — M797 Fibromyalgia: Secondary | ICD-10-CM | POA: Diagnosis not present

## 2015-04-17 DIAGNOSIS — G629 Polyneuropathy, unspecified: Secondary | ICD-10-CM | POA: Diagnosis not present

## 2015-04-17 DIAGNOSIS — J45909 Unspecified asthma, uncomplicated: Secondary | ICD-10-CM | POA: Diagnosis not present

## 2015-04-17 DIAGNOSIS — M1990 Unspecified osteoarthritis, unspecified site: Secondary | ICD-10-CM | POA: Diagnosis not present

## 2015-04-17 DIAGNOSIS — N183 Chronic kidney disease, stage 3 (moderate): Secondary | ICD-10-CM | POA: Diagnosis not present

## 2015-04-17 NOTE — Telephone Encounter (Signed)
Confirmed remote transmission w/ pt son.    

## 2015-04-18 ENCOUNTER — Encounter: Payer: Self-pay | Admitting: Cardiology

## 2015-04-18 DIAGNOSIS — I480 Paroxysmal atrial fibrillation: Secondary | ICD-10-CM | POA: Diagnosis not present

## 2015-04-18 DIAGNOSIS — I5042 Chronic combined systolic (congestive) and diastolic (congestive) heart failure: Secondary | ICD-10-CM | POA: Diagnosis not present

## 2015-04-18 DIAGNOSIS — M1990 Unspecified osteoarthritis, unspecified site: Secondary | ICD-10-CM | POA: Diagnosis not present

## 2015-04-18 DIAGNOSIS — G629 Polyneuropathy, unspecified: Secondary | ICD-10-CM | POA: Diagnosis not present

## 2015-04-18 DIAGNOSIS — I130 Hypertensive heart and chronic kidney disease with heart failure and stage 1 through stage 4 chronic kidney disease, or unspecified chronic kidney disease: Secondary | ICD-10-CM | POA: Diagnosis not present

## 2015-04-18 DIAGNOSIS — M797 Fibromyalgia: Secondary | ICD-10-CM | POA: Diagnosis not present

## 2015-04-18 DIAGNOSIS — G4730 Sleep apnea, unspecified: Secondary | ICD-10-CM | POA: Diagnosis not present

## 2015-04-18 DIAGNOSIS — J45909 Unspecified asthma, uncomplicated: Secondary | ICD-10-CM | POA: Diagnosis not present

## 2015-04-18 DIAGNOSIS — Z96651 Presence of right artificial knee joint: Secondary | ICD-10-CM | POA: Diagnosis not present

## 2015-04-18 DIAGNOSIS — Z952 Presence of prosthetic heart valve: Secondary | ICD-10-CM | POA: Diagnosis not present

## 2015-04-18 DIAGNOSIS — F0390 Unspecified dementia without behavioral disturbance: Secondary | ICD-10-CM | POA: Diagnosis not present

## 2015-04-18 DIAGNOSIS — S76111D Strain of right quadriceps muscle, fascia and tendon, subsequent encounter: Secondary | ICD-10-CM | POA: Diagnosis not present

## 2015-04-18 DIAGNOSIS — N183 Chronic kidney disease, stage 3 (moderate): Secondary | ICD-10-CM | POA: Diagnosis not present

## 2015-04-20 DIAGNOSIS — I5042 Chronic combined systolic (congestive) and diastolic (congestive) heart failure: Secondary | ICD-10-CM | POA: Diagnosis not present

## 2015-04-20 DIAGNOSIS — M797 Fibromyalgia: Secondary | ICD-10-CM | POA: Diagnosis not present

## 2015-04-20 DIAGNOSIS — G4730 Sleep apnea, unspecified: Secondary | ICD-10-CM | POA: Diagnosis not present

## 2015-04-20 DIAGNOSIS — J45909 Unspecified asthma, uncomplicated: Secondary | ICD-10-CM | POA: Diagnosis not present

## 2015-04-20 DIAGNOSIS — I130 Hypertensive heart and chronic kidney disease with heart failure and stage 1 through stage 4 chronic kidney disease, or unspecified chronic kidney disease: Secondary | ICD-10-CM | POA: Diagnosis not present

## 2015-04-20 DIAGNOSIS — Z96651 Presence of right artificial knee joint: Secondary | ICD-10-CM | POA: Diagnosis not present

## 2015-04-20 DIAGNOSIS — M1990 Unspecified osteoarthritis, unspecified site: Secondary | ICD-10-CM | POA: Diagnosis not present

## 2015-04-20 DIAGNOSIS — F0390 Unspecified dementia without behavioral disturbance: Secondary | ICD-10-CM | POA: Diagnosis not present

## 2015-04-20 DIAGNOSIS — S76111D Strain of right quadriceps muscle, fascia and tendon, subsequent encounter: Secondary | ICD-10-CM | POA: Diagnosis not present

## 2015-04-20 DIAGNOSIS — N183 Chronic kidney disease, stage 3 (moderate): Secondary | ICD-10-CM | POA: Diagnosis not present

## 2015-04-20 DIAGNOSIS — I480 Paroxysmal atrial fibrillation: Secondary | ICD-10-CM | POA: Diagnosis not present

## 2015-04-20 DIAGNOSIS — Z952 Presence of prosthetic heart valve: Secondary | ICD-10-CM | POA: Diagnosis not present

## 2015-04-20 DIAGNOSIS — G629 Polyneuropathy, unspecified: Secondary | ICD-10-CM | POA: Diagnosis not present

## 2015-04-24 DIAGNOSIS — I5042 Chronic combined systolic (congestive) and diastolic (congestive) heart failure: Secondary | ICD-10-CM | POA: Diagnosis not present

## 2015-04-24 DIAGNOSIS — Z96651 Presence of right artificial knee joint: Secondary | ICD-10-CM | POA: Diagnosis not present

## 2015-04-24 DIAGNOSIS — J45909 Unspecified asthma, uncomplicated: Secondary | ICD-10-CM | POA: Diagnosis not present

## 2015-04-24 DIAGNOSIS — G4730 Sleep apnea, unspecified: Secondary | ICD-10-CM | POA: Diagnosis not present

## 2015-04-24 DIAGNOSIS — M1990 Unspecified osteoarthritis, unspecified site: Secondary | ICD-10-CM | POA: Diagnosis not present

## 2015-04-24 DIAGNOSIS — M797 Fibromyalgia: Secondary | ICD-10-CM | POA: Diagnosis not present

## 2015-04-24 DIAGNOSIS — Z952 Presence of prosthetic heart valve: Secondary | ICD-10-CM | POA: Diagnosis not present

## 2015-04-24 DIAGNOSIS — N183 Chronic kidney disease, stage 3 (moderate): Secondary | ICD-10-CM | POA: Diagnosis not present

## 2015-04-24 DIAGNOSIS — I130 Hypertensive heart and chronic kidney disease with heart failure and stage 1 through stage 4 chronic kidney disease, or unspecified chronic kidney disease: Secondary | ICD-10-CM | POA: Diagnosis not present

## 2015-04-24 DIAGNOSIS — G629 Polyneuropathy, unspecified: Secondary | ICD-10-CM | POA: Diagnosis not present

## 2015-04-24 DIAGNOSIS — I480 Paroxysmal atrial fibrillation: Secondary | ICD-10-CM | POA: Diagnosis not present

## 2015-04-24 DIAGNOSIS — S76111D Strain of right quadriceps muscle, fascia and tendon, subsequent encounter: Secondary | ICD-10-CM | POA: Diagnosis not present

## 2015-04-26 DIAGNOSIS — Z96651 Presence of right artificial knee joint: Secondary | ICD-10-CM | POA: Diagnosis not present

## 2015-04-26 DIAGNOSIS — I480 Paroxysmal atrial fibrillation: Secondary | ICD-10-CM | POA: Diagnosis not present

## 2015-04-26 DIAGNOSIS — I5042 Chronic combined systolic (congestive) and diastolic (congestive) heart failure: Secondary | ICD-10-CM | POA: Diagnosis not present

## 2015-04-26 DIAGNOSIS — M797 Fibromyalgia: Secondary | ICD-10-CM | POA: Diagnosis not present

## 2015-04-26 DIAGNOSIS — Z952 Presence of prosthetic heart valve: Secondary | ICD-10-CM | POA: Diagnosis not present

## 2015-04-26 DIAGNOSIS — G4730 Sleep apnea, unspecified: Secondary | ICD-10-CM | POA: Diagnosis not present

## 2015-04-26 DIAGNOSIS — J45909 Unspecified asthma, uncomplicated: Secondary | ICD-10-CM | POA: Diagnosis not present

## 2015-04-26 DIAGNOSIS — S76111D Strain of right quadriceps muscle, fascia and tendon, subsequent encounter: Secondary | ICD-10-CM | POA: Diagnosis not present

## 2015-04-26 DIAGNOSIS — M1990 Unspecified osteoarthritis, unspecified site: Secondary | ICD-10-CM | POA: Diagnosis not present

## 2015-04-26 DIAGNOSIS — I130 Hypertensive heart and chronic kidney disease with heart failure and stage 1 through stage 4 chronic kidney disease, or unspecified chronic kidney disease: Secondary | ICD-10-CM | POA: Diagnosis not present

## 2015-04-26 DIAGNOSIS — G629 Polyneuropathy, unspecified: Secondary | ICD-10-CM | POA: Diagnosis not present

## 2015-04-26 DIAGNOSIS — N183 Chronic kidney disease, stage 3 (moderate): Secondary | ICD-10-CM | POA: Diagnosis not present

## 2015-04-29 DIAGNOSIS — M6281 Muscle weakness (generalized): Secondary | ICD-10-CM | POA: Diagnosis not present

## 2015-04-29 DIAGNOSIS — M66269 Spontaneous rupture of extensor tendons, unspecified lower leg: Secondary | ICD-10-CM | POA: Diagnosis not present

## 2015-04-29 DIAGNOSIS — Z4789 Encounter for other orthopedic aftercare: Secondary | ICD-10-CM | POA: Diagnosis not present

## 2015-05-01 DIAGNOSIS — N183 Chronic kidney disease, stage 3 (moderate): Secondary | ICD-10-CM | POA: Diagnosis not present

## 2015-05-01 DIAGNOSIS — J45909 Unspecified asthma, uncomplicated: Secondary | ICD-10-CM | POA: Diagnosis not present

## 2015-05-01 DIAGNOSIS — Z96651 Presence of right artificial knee joint: Secondary | ICD-10-CM | POA: Diagnosis not present

## 2015-05-01 DIAGNOSIS — I480 Paroxysmal atrial fibrillation: Secondary | ICD-10-CM | POA: Diagnosis not present

## 2015-05-01 DIAGNOSIS — Z952 Presence of prosthetic heart valve: Secondary | ICD-10-CM | POA: Diagnosis not present

## 2015-05-01 DIAGNOSIS — S76111D Strain of right quadriceps muscle, fascia and tendon, subsequent encounter: Secondary | ICD-10-CM | POA: Diagnosis not present

## 2015-05-01 DIAGNOSIS — G4730 Sleep apnea, unspecified: Secondary | ICD-10-CM | POA: Diagnosis not present

## 2015-05-01 DIAGNOSIS — M797 Fibromyalgia: Secondary | ICD-10-CM | POA: Diagnosis not present

## 2015-05-01 DIAGNOSIS — M1990 Unspecified osteoarthritis, unspecified site: Secondary | ICD-10-CM | POA: Diagnosis not present

## 2015-05-01 DIAGNOSIS — I5042 Chronic combined systolic (congestive) and diastolic (congestive) heart failure: Secondary | ICD-10-CM | POA: Diagnosis not present

## 2015-05-01 DIAGNOSIS — G629 Polyneuropathy, unspecified: Secondary | ICD-10-CM | POA: Diagnosis not present

## 2015-05-01 DIAGNOSIS — I130 Hypertensive heart and chronic kidney disease with heart failure and stage 1 through stage 4 chronic kidney disease, or unspecified chronic kidney disease: Secondary | ICD-10-CM | POA: Diagnosis not present

## 2015-05-02 DIAGNOSIS — J45909 Unspecified asthma, uncomplicated: Secondary | ICD-10-CM | POA: Diagnosis not present

## 2015-05-02 DIAGNOSIS — I5042 Chronic combined systolic (congestive) and diastolic (congestive) heart failure: Secondary | ICD-10-CM | POA: Diagnosis not present

## 2015-05-02 DIAGNOSIS — I480 Paroxysmal atrial fibrillation: Secondary | ICD-10-CM | POA: Diagnosis not present

## 2015-05-02 DIAGNOSIS — Z96651 Presence of right artificial knee joint: Secondary | ICD-10-CM | POA: Diagnosis not present

## 2015-05-02 DIAGNOSIS — G4730 Sleep apnea, unspecified: Secondary | ICD-10-CM | POA: Diagnosis not present

## 2015-05-02 DIAGNOSIS — G629 Polyneuropathy, unspecified: Secondary | ICD-10-CM | POA: Diagnosis not present

## 2015-05-02 DIAGNOSIS — I130 Hypertensive heart and chronic kidney disease with heart failure and stage 1 through stage 4 chronic kidney disease, or unspecified chronic kidney disease: Secondary | ICD-10-CM | POA: Diagnosis not present

## 2015-05-02 DIAGNOSIS — Z952 Presence of prosthetic heart valve: Secondary | ICD-10-CM | POA: Diagnosis not present

## 2015-05-02 DIAGNOSIS — N183 Chronic kidney disease, stage 3 (moderate): Secondary | ICD-10-CM | POA: Diagnosis not present

## 2015-05-02 DIAGNOSIS — S76111D Strain of right quadriceps muscle, fascia and tendon, subsequent encounter: Secondary | ICD-10-CM | POA: Diagnosis not present

## 2015-05-02 DIAGNOSIS — M797 Fibromyalgia: Secondary | ICD-10-CM | POA: Diagnosis not present

## 2015-05-02 DIAGNOSIS — M1990 Unspecified osteoarthritis, unspecified site: Secondary | ICD-10-CM | POA: Diagnosis not present

## 2015-05-03 DIAGNOSIS — G4730 Sleep apnea, unspecified: Secondary | ICD-10-CM | POA: Diagnosis not present

## 2015-05-03 DIAGNOSIS — Z952 Presence of prosthetic heart valve: Secondary | ICD-10-CM | POA: Diagnosis not present

## 2015-05-03 DIAGNOSIS — I10 Essential (primary) hypertension: Secondary | ICD-10-CM | POA: Diagnosis not present

## 2015-05-03 DIAGNOSIS — N183 Chronic kidney disease, stage 3 (moderate): Secondary | ICD-10-CM | POA: Diagnosis not present

## 2015-05-03 DIAGNOSIS — I130 Hypertensive heart and chronic kidney disease with heart failure and stage 1 through stage 4 chronic kidney disease, or unspecified chronic kidney disease: Secondary | ICD-10-CM | POA: Diagnosis not present

## 2015-05-03 DIAGNOSIS — S76111D Strain of right quadriceps muscle, fascia and tendon, subsequent encounter: Secondary | ICD-10-CM | POA: Diagnosis not present

## 2015-05-03 DIAGNOSIS — G629 Polyneuropathy, unspecified: Secondary | ICD-10-CM | POA: Diagnosis not present

## 2015-05-03 DIAGNOSIS — I480 Paroxysmal atrial fibrillation: Secondary | ICD-10-CM | POA: Diagnosis not present

## 2015-05-03 DIAGNOSIS — E039 Hypothyroidism, unspecified: Secondary | ICD-10-CM | POA: Diagnosis not present

## 2015-05-03 DIAGNOSIS — Z96651 Presence of right artificial knee joint: Secondary | ICD-10-CM | POA: Diagnosis not present

## 2015-05-03 DIAGNOSIS — I509 Heart failure, unspecified: Secondary | ICD-10-CM | POA: Diagnosis not present

## 2015-05-03 DIAGNOSIS — I5042 Chronic combined systolic (congestive) and diastolic (congestive) heart failure: Secondary | ICD-10-CM | POA: Diagnosis not present

## 2015-05-03 DIAGNOSIS — J45909 Unspecified asthma, uncomplicated: Secondary | ICD-10-CM | POA: Diagnosis not present

## 2015-05-03 DIAGNOSIS — M1990 Unspecified osteoarthritis, unspecified site: Secondary | ICD-10-CM | POA: Diagnosis not present

## 2015-05-03 DIAGNOSIS — M797 Fibromyalgia: Secondary | ICD-10-CM | POA: Diagnosis not present

## 2015-05-04 DIAGNOSIS — M1990 Unspecified osteoarthritis, unspecified site: Secondary | ICD-10-CM | POA: Diagnosis not present

## 2015-05-04 DIAGNOSIS — Z96651 Presence of right artificial knee joint: Secondary | ICD-10-CM | POA: Diagnosis not present

## 2015-05-04 DIAGNOSIS — S76111D Strain of right quadriceps muscle, fascia and tendon, subsequent encounter: Secondary | ICD-10-CM | POA: Diagnosis not present

## 2015-05-04 DIAGNOSIS — I509 Heart failure, unspecified: Secondary | ICD-10-CM | POA: Diagnosis not present

## 2015-05-04 DIAGNOSIS — M797 Fibromyalgia: Secondary | ICD-10-CM | POA: Diagnosis not present

## 2015-05-04 DIAGNOSIS — Z952 Presence of prosthetic heart valve: Secondary | ICD-10-CM | POA: Diagnosis not present

## 2015-05-04 DIAGNOSIS — G4730 Sleep apnea, unspecified: Secondary | ICD-10-CM | POA: Diagnosis not present

## 2015-05-04 DIAGNOSIS — G629 Polyneuropathy, unspecified: Secondary | ICD-10-CM | POA: Diagnosis not present

## 2015-05-04 DIAGNOSIS — I480 Paroxysmal atrial fibrillation: Secondary | ICD-10-CM | POA: Diagnosis not present

## 2015-05-04 DIAGNOSIS — N183 Chronic kidney disease, stage 3 (moderate): Secondary | ICD-10-CM | POA: Diagnosis not present

## 2015-05-04 DIAGNOSIS — J45909 Unspecified asthma, uncomplicated: Secondary | ICD-10-CM | POA: Diagnosis not present

## 2015-05-04 DIAGNOSIS — I130 Hypertensive heart and chronic kidney disease with heart failure and stage 1 through stage 4 chronic kidney disease, or unspecified chronic kidney disease: Secondary | ICD-10-CM | POA: Diagnosis not present

## 2015-05-04 DIAGNOSIS — I5042 Chronic combined systolic (congestive) and diastolic (congestive) heart failure: Secondary | ICD-10-CM | POA: Diagnosis not present

## 2015-05-07 DIAGNOSIS — I480 Paroxysmal atrial fibrillation: Secondary | ICD-10-CM | POA: Diagnosis not present

## 2015-05-07 DIAGNOSIS — E039 Hypothyroidism, unspecified: Secondary | ICD-10-CM | POA: Diagnosis not present

## 2015-05-08 DIAGNOSIS — S76111D Strain of right quadriceps muscle, fascia and tendon, subsequent encounter: Secondary | ICD-10-CM | POA: Diagnosis not present

## 2015-05-08 DIAGNOSIS — N183 Chronic kidney disease, stage 3 (moderate): Secondary | ICD-10-CM | POA: Diagnosis not present

## 2015-05-08 DIAGNOSIS — I130 Hypertensive heart and chronic kidney disease with heart failure and stage 1 through stage 4 chronic kidney disease, or unspecified chronic kidney disease: Secondary | ICD-10-CM | POA: Diagnosis not present

## 2015-05-08 DIAGNOSIS — I5042 Chronic combined systolic (congestive) and diastolic (congestive) heart failure: Secondary | ICD-10-CM | POA: Diagnosis not present

## 2015-05-09 DIAGNOSIS — I5042 Chronic combined systolic (congestive) and diastolic (congestive) heart failure: Secondary | ICD-10-CM | POA: Diagnosis not present

## 2015-05-09 DIAGNOSIS — N183 Chronic kidney disease, stage 3 (moderate): Secondary | ICD-10-CM | POA: Diagnosis not present

## 2015-05-09 DIAGNOSIS — S76111D Strain of right quadriceps muscle, fascia and tendon, subsequent encounter: Secondary | ICD-10-CM | POA: Diagnosis not present

## 2015-05-09 DIAGNOSIS — I130 Hypertensive heart and chronic kidney disease with heart failure and stage 1 through stage 4 chronic kidney disease, or unspecified chronic kidney disease: Secondary | ICD-10-CM | POA: Diagnosis not present

## 2015-05-10 DIAGNOSIS — N183 Chronic kidney disease, stage 3 (moderate): Secondary | ICD-10-CM | POA: Diagnosis not present

## 2015-05-10 DIAGNOSIS — S76111D Strain of right quadriceps muscle, fascia and tendon, subsequent encounter: Secondary | ICD-10-CM | POA: Diagnosis not present

## 2015-05-10 DIAGNOSIS — I130 Hypertensive heart and chronic kidney disease with heart failure and stage 1 through stage 4 chronic kidney disease, or unspecified chronic kidney disease: Secondary | ICD-10-CM | POA: Diagnosis not present

## 2015-05-10 DIAGNOSIS — I5042 Chronic combined systolic (congestive) and diastolic (congestive) heart failure: Secondary | ICD-10-CM | POA: Diagnosis not present

## 2015-05-15 DIAGNOSIS — Z7901 Long term (current) use of anticoagulants: Secondary | ICD-10-CM | POA: Diagnosis not present

## 2015-05-15 DIAGNOSIS — I130 Hypertensive heart and chronic kidney disease with heart failure and stage 1 through stage 4 chronic kidney disease, or unspecified chronic kidney disease: Secondary | ICD-10-CM | POA: Diagnosis not present

## 2015-05-15 DIAGNOSIS — I4891 Unspecified atrial fibrillation: Secondary | ICD-10-CM | POA: Diagnosis not present

## 2015-05-15 DIAGNOSIS — S76111D Strain of right quadriceps muscle, fascia and tendon, subsequent encounter: Secondary | ICD-10-CM | POA: Diagnosis not present

## 2015-05-15 DIAGNOSIS — N183 Chronic kidney disease, stage 3 (moderate): Secondary | ICD-10-CM | POA: Diagnosis not present

## 2015-05-15 DIAGNOSIS — I5042 Chronic combined systolic (congestive) and diastolic (congestive) heart failure: Secondary | ICD-10-CM | POA: Diagnosis not present

## 2015-05-16 DIAGNOSIS — I5042 Chronic combined systolic (congestive) and diastolic (congestive) heart failure: Secondary | ICD-10-CM | POA: Diagnosis not present

## 2015-05-16 DIAGNOSIS — I130 Hypertensive heart and chronic kidney disease with heart failure and stage 1 through stage 4 chronic kidney disease, or unspecified chronic kidney disease: Secondary | ICD-10-CM | POA: Diagnosis not present

## 2015-05-16 DIAGNOSIS — S76111D Strain of right quadriceps muscle, fascia and tendon, subsequent encounter: Secondary | ICD-10-CM | POA: Diagnosis not present

## 2015-05-16 DIAGNOSIS — N183 Chronic kidney disease, stage 3 (moderate): Secondary | ICD-10-CM | POA: Diagnosis not present

## 2015-05-18 DIAGNOSIS — S76111D Strain of right quadriceps muscle, fascia and tendon, subsequent encounter: Secondary | ICD-10-CM | POA: Diagnosis not present

## 2015-05-18 DIAGNOSIS — I5042 Chronic combined systolic (congestive) and diastolic (congestive) heart failure: Secondary | ICD-10-CM | POA: Diagnosis not present

## 2015-05-18 DIAGNOSIS — N183 Chronic kidney disease, stage 3 (moderate): Secondary | ICD-10-CM | POA: Diagnosis not present

## 2015-05-18 DIAGNOSIS — I130 Hypertensive heart and chronic kidney disease with heart failure and stage 1 through stage 4 chronic kidney disease, or unspecified chronic kidney disease: Secondary | ICD-10-CM | POA: Diagnosis not present

## 2015-05-22 DIAGNOSIS — I130 Hypertensive heart and chronic kidney disease with heart failure and stage 1 through stage 4 chronic kidney disease, or unspecified chronic kidney disease: Secondary | ICD-10-CM | POA: Diagnosis not present

## 2015-05-22 DIAGNOSIS — N183 Chronic kidney disease, stage 3 (moderate): Secondary | ICD-10-CM | POA: Diagnosis not present

## 2015-05-22 DIAGNOSIS — S76111D Strain of right quadriceps muscle, fascia and tendon, subsequent encounter: Secondary | ICD-10-CM | POA: Diagnosis not present

## 2015-05-22 DIAGNOSIS — I5042 Chronic combined systolic (congestive) and diastolic (congestive) heart failure: Secondary | ICD-10-CM | POA: Diagnosis not present

## 2015-05-24 DIAGNOSIS — Z4789 Encounter for other orthopedic aftercare: Secondary | ICD-10-CM | POA: Diagnosis not present

## 2015-05-24 DIAGNOSIS — S76111D Strain of right quadriceps muscle, fascia and tendon, subsequent encounter: Secondary | ICD-10-CM | POA: Diagnosis not present

## 2015-05-24 DIAGNOSIS — N183 Chronic kidney disease, stage 3 (moderate): Secondary | ICD-10-CM | POA: Diagnosis not present

## 2015-05-24 DIAGNOSIS — I5042 Chronic combined systolic (congestive) and diastolic (congestive) heart failure: Secondary | ICD-10-CM | POA: Diagnosis not present

## 2015-05-24 DIAGNOSIS — I130 Hypertensive heart and chronic kidney disease with heart failure and stage 1 through stage 4 chronic kidney disease, or unspecified chronic kidney disease: Secondary | ICD-10-CM | POA: Diagnosis not present

## 2015-05-25 DIAGNOSIS — I130 Hypertensive heart and chronic kidney disease with heart failure and stage 1 through stage 4 chronic kidney disease, or unspecified chronic kidney disease: Secondary | ICD-10-CM | POA: Diagnosis not present

## 2015-05-25 DIAGNOSIS — S76111D Strain of right quadriceps muscle, fascia and tendon, subsequent encounter: Secondary | ICD-10-CM | POA: Diagnosis not present

## 2015-05-25 DIAGNOSIS — I5042 Chronic combined systolic (congestive) and diastolic (congestive) heart failure: Secondary | ICD-10-CM | POA: Diagnosis not present

## 2015-05-25 DIAGNOSIS — N183 Chronic kidney disease, stage 3 (moderate): Secondary | ICD-10-CM | POA: Diagnosis not present

## 2015-05-30 DIAGNOSIS — M6281 Muscle weakness (generalized): Secondary | ICD-10-CM | POA: Diagnosis not present

## 2015-05-30 DIAGNOSIS — M66269 Spontaneous rupture of extensor tendons, unspecified lower leg: Secondary | ICD-10-CM | POA: Diagnosis not present

## 2015-05-30 DIAGNOSIS — Z4789 Encounter for other orthopedic aftercare: Secondary | ICD-10-CM | POA: Diagnosis not present

## 2015-06-04 DIAGNOSIS — I509 Heart failure, unspecified: Secondary | ICD-10-CM | POA: Diagnosis not present

## 2015-06-12 DIAGNOSIS — N184 Chronic kidney disease, stage 4 (severe): Secondary | ICD-10-CM | POA: Diagnosis not present

## 2015-06-12 DIAGNOSIS — N2581 Secondary hyperparathyroidism of renal origin: Secondary | ICD-10-CM | POA: Diagnosis not present

## 2015-06-12 DIAGNOSIS — I4891 Unspecified atrial fibrillation: Secondary | ICD-10-CM | POA: Diagnosis not present

## 2015-06-12 DIAGNOSIS — Z7901 Long term (current) use of anticoagulants: Secondary | ICD-10-CM | POA: Diagnosis not present

## 2015-06-12 DIAGNOSIS — N189 Chronic kidney disease, unspecified: Secondary | ICD-10-CM | POA: Diagnosis not present

## 2015-06-13 DIAGNOSIS — S86811D Strain of other muscle(s) and tendon(s) at lower leg level, right leg, subsequent encounter: Secondary | ICD-10-CM | POA: Diagnosis not present

## 2015-06-13 DIAGNOSIS — Z4789 Encounter for other orthopedic aftercare: Secondary | ICD-10-CM | POA: Diagnosis not present

## 2015-06-13 DIAGNOSIS — Z96651 Presence of right artificial knee joint: Secondary | ICD-10-CM | POA: Diagnosis not present

## 2015-06-13 DIAGNOSIS — Z471 Aftercare following joint replacement surgery: Secondary | ICD-10-CM | POA: Diagnosis not present

## 2015-06-14 NOTE — H&P (Signed)
[name] is an 82 y.o. female.    Chief Complaint:   Reavulsion of right patella tendon  Procedure:    Revision / repair right patella tendon with application of long leg cast   HPI: Pt is a 82 y.o. female complaining of inability to extend right right leg.  She has had pain since the last knee surgery on 03/08/2015.  She has worked with PT, but still hasn't been able to fully extend her right leg.  X-rays in the clinic show previous TKA with patella superiorly displaced. Pt has tried various conservative treatments which have failed to alleviate their symptoms, including PT and brace. Various options are discussed with the patient. Risks, benefits and expectations were discussed with the patient. Patient understand the risks, benefits and expectations and wishes to proceed with surgery.   PCP: Pearson Grippe, MD  D/C Plans:      SNF - [place]  Post-op Meds:       No Rx given  Tranexamic Acid:      To be given - topically  (previous PE)  Decadron:      Is to be given  FYI:     Coumadin with branching Lovenox    PMH: Past Medical History  Diagnosis Date  . Syncope   . Symptomatic bradycardia   . Chronic lower back pain   . Nonischemic cardiomyopathy (HCC)   . HTN (hypertension)   . HLD (hyperlipidemia)   . Atrial fibrillation (HCC)     tachy-brady syndrome with <1% recurrent PAF since pacemaker placement  . H/O: stroke   . Pulmonary embolism (HCC)     HISTORY OF, the pt. had a recurrent bilateral pulmonary emboli in 2005, on warfarin therapy and at which time she under went implantation of IVC filter  . Anemia   . Dementia   . Gastroesophageal reflux disease   . Aortic stenosis 10/13/2012    Low EF, low gradient with severe aortic stenosis confirmed by dobutamine stress echocardiogram s/p TAVR 12/2012  . PONV (postoperative nausea and vomiting)   . Osteoarthritis   . Neuropathy (HCC)   . Spinal stenosis   . Symptomatic bradycardia 2012    s/p Medtronic PPM  . Chronic  combined systolic and diastolic CHF (congestive heart failure) (HCC)   . Fibromyalgia   . Presence of permanent cardiac pacemaker   . Sleep apnea     uses oxygen at night and PRN- not used since > 6 months   . Shortness of breath dyspnea     with exertion   . Pneumonia     hx of x 3   . Acute on chronic renal failure [facility])     sees Dr Allena Katz   . CKD (chronic kidney disease)   . Headache     hx of migraines   . On home oxygen therapy     patient uses at nite- 2L- has not used in > 6 months per patient  . Dysrhythmia   . Heart murmur   . Stroke (HCC)   . Asthma   . History of bronchitis   . Cataracts, bilateral   . Hard of hearing   . H/O dizziness   . Repeated falls   . Hypothyroidism   . History of kidney stones   . Urinary incontinence   . History of urinary tract infection   . H/O urinary frequency   . Urinary urgency   . History of blood transfusion   . Aortic regurgitation   .  Sciatica     PSH: Past Surgical History  Procedure Laterality Date  . Pacemaker insertion      Medtronic  . Transcatheter aortic valve replacement, transfemoral  12/21/2012    a. 29mm Edwards Sapien XT transcatheter heart valve placed via open left transfemoral approach b. Intra-op TEE: well-seated bioprosthetic aortic valve with mean gradient 2 mmHg, trivial AI, mild MR, EF 30-35%  . Transcatheter aortic valve replacement, transfemoral N/A 12/21/2012    Procedure: TRANSCATHETER AORTIC VALVE REPLACEMENT, TRANSFEMORAL;  Surgeon: Tonny Bollman, MD;  Location: [facility] OR;  Service: Open Heart Surgery;  Laterality: N/A;  . Intraoperative transesophageal echocardiogram N/A 12/21/2012    Procedure: INTRAOPERATIVE TRANSESOPHAGEAL ECHOCARDIOGRAM;  Surgeon: Tonny Bollman, MD;  Location: [facility] OR;  Service: Open Heart Surgery;  Laterality: N/A;  . Central venous catheter insertion Left 12/21/2012    Procedure: INSERTION CENTRAL LINE ADULT;  Surgeon: Tonny Bollman, MD;  Location: [facility] OR;  Service: Open Heart Surgery;   Laterality: Left;  . Cataract extraction    . Bunionectomy    . Appendectomy    . Abdominal hysterectomy    . Thyroidectomy, partial    . Back surgery    . Knee surgery Left   . Nose surgery      X 2  . Left and right heart catheterization with coronary angiogram N/A 10/18/2012    Procedure: LEFT AND RIGHT HEART CATHETERIZATION WITH CORONARY ANGIOGRAM;  Surgeon: Tonny Bollman, MD;  Location: [facility] CATH LAB;  Service: Cardiovascular;  Laterality: N/A;  . Nasal septum surgery    . Total knee arthroplasty Right 12/04/2014    Procedure: RIGHT TOTAL  KNEE ARTHROPLASTY;  Surgeon: Durene Romans, MD;  Location: [facility] ORS;  Service: Orthopedics;  Laterality: Right;  . Tonsillectomy    . Cardiac catheterization    . Shoulder surgery      bilat   . Elbow surgery      bilat   . Orif patella Right 03/08/2015    Procedure:  OPEN REDUCTION INTERNAL FIXATION RIGHT  PATELLA TENDON AVULSION;  Surgeon: Durene Romans, MD;  Location: [facility] ORS;  Service: Orthopedics;  Laterality: Right;    Social History:  reports that she has never smoked. She has never used smokeless tobacco. She reports that she does not drink alcohol or use illicit drugs.  Allergies:  Allergies  Allergen Reactions  . Nubain [Nalbuphine Hcl] Hives    Went into cardiac arrest   . Codeine Hives and Nausea Only  . Darvocet [Propoxyphene N-Acetaminophen] Nausea Only    Medications: No current facility-administered medications for this encounter.   Current Outpatient Prescriptions  Medication Sig Dispense Refill  . alendronate (FOSAMAX) 70 MG tablet Take 70 mg by mouth once a week. Saturday.    Marland Kitchen amiodarone (PACERONE) 200 MG tablet Take 0.5 tablets (100 mg total) by mouth daily. (Patient taking differently: Take 50 mg by mouth every morning. )    . BIOTIN 5000 PO Take 5,000 mcg by mouth every evening.     . calcitRIOL (ROCALTROL) 0.25 MCG capsule Take 0.25 mcg by mouth every morning.     . carvedilol (COREG) 12.5 MG tablet Take 1 tablet by  mouth 2 (two) times daily.  3  . cetirizine (ZYRTEC) 10 MG tablet Take 10 mg by mouth every morning.     . Cholecalciferol (VITAMIN D) 1000 UNITS capsule Take 2,000 Units by mouth at bedtime.     . colchicine 0.6 MG tablet Take 0.5 tablets (0.3 mg total) by mouth daily. (Patient taking  differently: Take 0.3 mg by mouth daily as needed (gout flare up.). ) 30 tablet 1  . cycloSPORINE (RESTASIS) 0.05 % ophthalmic emulsion Place 1 drop into both eyes 2 (two) times daily as needed (dry eyes.).    Marland Kitchen docusate sodium (COLACE) 50 MG capsule Take 50 mg by mouth 2 (two) times daily.     Marland Kitchen donepezil (ARICEPT) 10 MG tablet Take 10 mg by mouth at bedtime.     . DULoxetine (CYMBALTA) 60 MG capsule Take 60 mg by mouth every evening.     . hydrALAZINE (APRESOLINE) 10 MG tablet Take 10 mg by mouth 3 (three) times daily.     Marland Kitchen HYDROcodone-acetaminophen (NORCO) 10-325 MG tablet Take 0.5 tablets by mouth 4 (four) times daily.    Marland Kitchen ipratropium-albuterol (DUONEB) 0.5-2.5 (3) MG/3ML SOLN Take 3 mLs by nebulization daily as needed.     Marland Kitchen levothyroxine (SYNTHROID, LEVOTHROID) 112 MCG tablet Take 112 mcg by mouth daily before breakfast.    . Linaclotide (LINZESS) 145 MCG CAPS capsule Take 145 mcg by mouth every other day. Alternates with the 290 mg tablet in the am.    . Linaclotide 290 MCG CAPS Take 290 mcg by mouth every other day. Alternates with the 145 mg tablets in the morning.    . memantine (NAMENDA) 10 MG tablet Take 10 mg by mouth 2 (two) times daily.     . Multiple Vitamin (MULTIVITAMIN) tablet Take 0.5 tablets by mouth every morning.     . nitroGLYCERIN (NITROSTAT) 0.4 MG SL tablet Place 0.4 mg under the tongue every 5 (five) minutes as needed for chest pain (MAX 3 TABLETS).     . NON FORMULARY 2 Liters oxygen as needed for shortness of breath    . omeprazole (PRILOSEC) 20 MG capsule Take 20 mg by mouth daily.    . polyethylene glycol (MIRALAX / GLYCOLAX) packet Take 17 g by mouth 2 (two) times daily. 14 each 0    . pravastatin (PRAVACHOL) 40 MG tablet Take 40 mg by mouth every evening.     . pregabalin (LYRICA) 75 MG capsule Take 75 mg by mouth 2 (two) times daily.     Marland Kitchen torsemide (DEMADEX) 20 MG tablet Take 20 mg by mouth 2 (two) times daily. PEr Dr Allena Katz - patient to not take on 8/14 nor am of 8/15    . Turmeric 500 MG CAPS Take 1 capsule by mouth 2 (two) times daily.    Marland Kitchen ULORIC 40 MG tablet Take 40 mg by mouth every evening.     . vitamin B-12 (CYANOCOBALAMIN) 1000 MCG tablet Take 1,000 mcg by mouth 2 (two) times a week. Tuesday and Friday    . warfarin (COUMADIN) 5 MG tablet Take 2.5 mg by mouth every evening.    . ferrous sulfate 325 (65 FE) MG tablet Take 1 tablet (325 mg total) by mouth 3 (three) times daily after meals. (Patient not taking: Reported on 06/07/2015)  3   Facility-Administered Medications Ordered in Other Encounters  Medication Dose Route Frequency Provider Last Rate Last Dose  . sodium bicarbonate first hour bolus via infusion   Intravenous Once Pearson Grippe, MD          Review of Systems  Constitutional: Negative.   Eyes: Negative.   Respiratory: Positive for shortness of breath (with exertion).   Cardiovascular: Negative.   Gastrointestinal: Positive for heartburn.  Genitourinary: Negative.   Musculoskeletal: Positive for back pain and joint pain.  Skin: Negative.   Neurological: Positive  for headaches.  Endo/Heme/Allergies: Negative.   Psychiatric/Behavioral: Negative.        Physical Exam  Constitutional: She is oriented to person, place, and time. She appears well-developed.  HENT:  Head: Normocephalic.  Eyes: Pupils are equal, round, and reactive to light.  Neck: Neck supple. No JVD present. No tracheal deviation present. No thyromegaly present.  Cardiovascular: Normal rate, regular rhythm and intact distal pulses.   Murmur heard. Respiratory: Effort normal and breath sounds normal. No stridor. No respiratory distress. She has no wheezes.  GI: Soft. There  is no tenderness. There is no guarding.  Musculoskeletal:       Right knee: She exhibits decreased range of motion, swelling, deformity, laceration (healed previous incision), abnormal alignment, abnormal patellar mobility and bony tenderness. She exhibits no ecchymosis and no erythema. Tenderness found.  Lymphadenopathy:    She has no cervical adenopathy.  Neurological: She is alert and oriented to person, place, and time. A sensory deficit (bilateral LE neuropathy) is present.  Skin: Skin is warm and dry.  Psychiatric: She has a normal mood and affect.     Assessment/Plan Assessment:   Reavulsion of right patella tendon   Plan: Patient will undergo a revision / repair right patella tendon with application of long leg cast on 06/25/2015 per Dr. Charlann Boxer at [facility]. Risks benefits and expectations were discussed with the patient. Patient understand risks, benefits and expectations and wishes to proceed.   Anastasio Auerbach Pallas Wahlert   PA-C  06/14/2015, 8:41 AM

## 2015-06-18 ENCOUNTER — Encounter (HOSPITAL_COMMUNITY): Payer: Self-pay

## 2015-06-18 NOTE — Patient Instructions (Addendum)
YOUR PROCEDURE IS SCHEDULED ON :  06/25/15  REPORT TO [facility] MAIN ENTRANCE FOLLOW SIGNS TO EAST ELEVATOR - GO TO 3rd FLOOR CHECK IN AT 3 EAST NURSES STATION (SHORT STAY) AT:   1:00 PM  CALL THIS NUMBER IF YOU HAVE PROBLEMS THE MORNING OF SURGERY [phone]  REMEMBER:ONLY 1 PER PERSON MAY GO TO SHORT STAY WITH YOU TO GET READY THE MORNING OF YOUR SURGERY  DO NOT EAT FOOD  AFTER MIDNIGHT  MAY HAVE CLEAR LIQUIDS UNTIL 9:00 AM  TAKE THESE MEDICINES THE MORNING OF SURGERY: AMIODARONE / SYNTHROID / LYRICA / CARVIDILOL / NAMENDA / ZYRTEC / HYDRALAZINE / PRILOSEC    CLEAR LIQUID DIET   Foods Allowed                                                                     Foods Excluded  Coffee and tea, regular and decaf                             liquids that you cannot  Plain Jell-O in any flavor                                             see through such as: Fruit ices (not with fruit pulp)                                     milk, soups, orange juice  Iced Popsicles                                                              All solid food Carbonated beverages, regular and diet                                    Cranberry, grape and apple juices Sports drinks like Gatorade Lightly seasoned clear broth or consume(fat free) Sugar, honey syrup  _____________________________________________________________________    YOU MAY NOT HAVE ANY METAL ON YOUR BODY INCLUDING HAIR PINS AND PIERCING'S. DO NOT WEAR JEWELRY, MAKEUP, LOTIONS, POWDERS OR PERFUMES. DO NOT WEAR NAIL POLISH. DO NOT SHAVE 48 HRS PRIOR TO SURGERY. MEN MAY SHAVE FACE AND NECK.  DO NOT BRING VALUABLES TO HOSPITAL. [facility] IS NOT RESPONSIBLE FOR VALUABLES.  CONTACTS, DENTURES OR PARTIALS MAY NOT BE WORN TO SURGERY. LEAVE SUITCASE IN CAR. CAN BE BROUGHT TO ROOM AFTER SURGERY.  PATIENTS DISCHARGED THE DAY OF SURGERY WILL NOT BE ALLOWED TO DRIVE HOME.  PLEASE READ OVER THE FOLLOWING INSTRUCTION  SHEETS _________________________________________________________________________________  [facility] - PREPARING FOR SURGERY  Before surgery, you can play an important role.  Because skin is not sterile, your skin needs to be as free of germs as possible.  You can reduce the number of germs on your skin by washing with CHG (chlorahexidine gluconate) soap before surgery.  CHG is an antiseptic cleaner which kills germs and bonds with the skin to continue killing germs even after washing. Please DO NOT use if you have an allergy to CHG or antibacterial soaps.  If your skin becomes reddened/irritated stop using the CHG and inform your nurse when you arrive at Short Stay. Do not shave (including legs and underarms) for at least 48 hours prior to the first CHG shower.  You may shave your face. Please follow these instructions carefully:   1.  Shower with CHG Soap the night before surgery and the  morning of Surgery.   2.  If you choose to wash your hair, wash your hair first as usual with your  normal  Shampoo.   3.  After you shampoo, rinse your hair and body thoroughly to remove the  shampoo.                                         4.  Use CHG as you would any other liquid soap.  You can apply chg directly  to the skin and wash . Gently wash with scrungie or clean wascloth    5.  Apply the CHG Soap to your body ONLY FROM THE NECK DOWN.   Do not use on open                           Wound or open sores. Avoid contact with eyes, ears mouth and genitals (private parts).                        Genitals (private parts) with your normal soap.              6.  Wash thoroughly, paying special attention to the area where your surgery  will be performed.   7.  Thoroughly rinse your body with warm water from the neck down.   8.  DO NOT shower/wash with your normal soap after using and rinsing off  the CHG Soap .                9.  Pat yourself dry with a clean  towel.             10.  Wear clean night clothes to bed after shower             11.  Place clean sheets on your bed the night of your first shower and do not  sleep with pets.  Day of Surgery : Do not apply any lotions/deodorants the morning of surgery.  Please wear clean clothes to the hospital/surgery center.  FAILURE TO FOLLOW THESE INSTRUCTIONS MAY RESULT IN THE CANCELLATION OF YOUR SURGERY    PATIENT SIGNATURE_________________________________  ______________________________________________________________________    [name]  An incentive spirometer is a tool that can help keep your lungs clear and active. This tool measures how well you are filling your lungs with each breath. Taking long deep breaths may help reverse or decrease the chance of developing breathing (pulmonary) problems (  especially infection) following:  A long period of time when you are unable to move or be active. BEFORE THE PROCEDURE   If the spirometer includes an indicator to show your best effort, your nurse or respiratory therapist will set it to a desired goal.  If possible, sit up straight or lean slightly forward. Try not to slouch.  Hold the incentive spirometer in an upright position. INSTRUCTIONS FOR USE  1. Sit on the edge of your bed if possible, or sit up as far as you can in bed or on a chair. 2. Hold the incentive spirometer in an upright position. 3. Breathe out normally. 4. Place the mouthpiece in your mouth and seal your lips tightly around it. 5. Breathe in slowly and as deeply as possible, raising the piston or the ball toward the top of the column. 6. Hold your breath for 3-5 seconds or for as long as possible. Allow the piston or ball to fall to the bottom of the column. 7. Remove the mouthpiece from your mouth and breathe out normally. 8. Rest for a few seconds and repeat Steps 1 through 7 at least 10 times every 1-2 hours when you are awake. Take your time and take a  few normal breaths between deep breaths. 9. The spirometer may include an indicator to show your best effort. Use the indicator as a goal to work toward during each repetition. 10. After each set of 10 deep breaths, practice coughing to be sure your lungs are clear. If you have an incision (the cut made at the time of surgery), support your incision when coughing by placing a pillow or rolled up towels firmly against it. Once you are able to get out of bed, walk around indoors and cough well. You may stop using the incentive spirometer when instructed by your caregiver.  RISKS AND COMPLICATIONS  Take your time so you do not get dizzy or light-headed.  If you are in pain, you may need to take or ask for pain medication before doing incentive spirometry. It is harder to take a deep breath if you are having pain. AFTER USE  Rest and breathe slowly and easily.  It can be helpful to keep track of a log of your progress. Your caregiver can provide you with a simple table to help with this. If you are using the spirometer at home, follow these instructions: SEEK MEDICAL CARE IF:   You are having difficultly using the spirometer.  You have trouble using the spirometer as often as instructed.  Your pain medication is not giving enough relief while using the spirometer.  You develop fever of 100.5 F (38.1 C) or higher. SEEK IMMEDIATE MEDICAL CARE IF:   You cough up bloody sputum that had not been present before.  You develop fever of 102 F (38.9 C) or greater.  You develop worsening pain at or near the incision site. MAKE SURE YOU:   Understand these instructions.  Will watch your condition.  Will get help right away if you are not doing well or get worse. Document Released: 08/18/2006 Document Revised: 06/30/2011 Document Reviewed: 10/19/2006 [facility] Patient Information 2014 [place], [place].   ________________________________________________________________________

## 2015-06-19 ENCOUNTER — Encounter (HOSPITAL_COMMUNITY)
Admission: RE | Admit: 2015-06-19 | Discharge: 2015-06-19 | Disposition: A | Discharge status: Home / Self Care | Payer: Medicare Other | Source: Ambulatory Visit | Attending: Orthopedic Surgery | Admitting: Orthopedic Surgery

## 2015-06-19 ENCOUNTER — Encounter (HOSPITAL_COMMUNITY): Payer: Self-pay

## 2015-06-19 DIAGNOSIS — N184 Chronic kidney disease, stage 4 (severe): Secondary | ICD-10-CM | POA: Diagnosis not present

## 2015-06-19 DIAGNOSIS — N2581 Secondary hyperparathyroidism of renal origin: Secondary | ICD-10-CM | POA: Diagnosis not present

## 2015-06-19 DIAGNOSIS — D631 Anemia in chronic kidney disease: Secondary | ICD-10-CM | POA: Diagnosis not present

## 2015-06-19 DIAGNOSIS — Z0183 Encounter for blood typing: Secondary | ICD-10-CM | POA: Diagnosis not present

## 2015-06-19 DIAGNOSIS — Z01812 Encounter for preprocedural laboratory examination: Secondary | ICD-10-CM | POA: Diagnosis not present

## 2015-06-19 DIAGNOSIS — I129 Hypertensive chronic kidney disease with stage 1 through stage 4 chronic kidney disease, or unspecified chronic kidney disease: Secondary | ICD-10-CM | POA: Diagnosis not present

## 2015-06-19 DIAGNOSIS — X58XXXA Exposure to other specified factors, initial encounter: Secondary | ICD-10-CM | POA: Diagnosis not present

## 2015-06-19 DIAGNOSIS — S76191A Other specified injury of right quadriceps muscle, fascia and tendon, initial encounter: Secondary | ICD-10-CM | POA: Insufficient documentation

## 2015-06-19 LAB — BASIC METABOLIC PANEL
ANION GAP: 10 (ref 5–15)
BUN: 53 mg/dL — ABNORMAL HIGH (ref 6–20)
CALCIUM: 9.7 mg/dL (ref 8.9–10.3)
CO2: 29 mmol/L (ref 22–32)
Chloride: 99 mmol/L — ABNORMAL LOW (ref 101–111)
Creatinine, Ser: 2.25 mg/dL — ABNORMAL HIGH (ref 0.44–1.00)
GFR, EST AFRICAN AMERICAN: 22 mL/min — AB (ref >60)
GFR, EST NON AFRICAN AMERICAN: 19 mL/min — AB (ref >60)
Glucose, Bld: 75 mg/dL (ref 65–99)
POTASSIUM: 4.6 mmol/L (ref 3.5–5.1)
Sodium: 138 mmol/L (ref 135–145)

## 2015-06-19 LAB — URINALYSIS, ROUTINE W REFLEX MICROSCOPIC
BILIRUBIN URINE: NEGATIVE
GLUCOSE, UA: NEGATIVE mg/dL
Hgb urine dipstick: NEGATIVE
KETONES UR: NEGATIVE mg/dL
Leukocytes, UA: NEGATIVE
Nitrite: NEGATIVE
PH: 6 (ref 5.0–8.0)
Protein, ur: NEGATIVE mg/dL
Specific Gravity, Urine: 1.007 (ref 1.005–1.030)

## 2015-06-19 LAB — CBC
HEMATOCRIT: 36.6 % (ref 36.0–46.0)
HEMOGLOBIN: 11.7 g/dL — AB (ref 12.0–15.0)
MCH: 29.1 pg (ref 26.0–34.0)
MCHC: 32 g/dL (ref 30.0–36.0)
MCV: 91 fL (ref 78.0–100.0)
Platelets: 255 10*3/uL (ref 150–400)
RBC: 4.02 MIL/uL (ref 3.87–5.11)
RDW: 15.1 % (ref 11.5–15.5)
WBC: 5.8 10*3/uL (ref 4.0–10.5)

## 2015-06-19 LAB — PROTIME-INR
INR: 2.32 — AB (ref 0.00–1.49)
PROTHROMBIN TIME: 25.2 s — AB (ref 11.6–15.2)

## 2015-06-19 LAB — APTT: aPTT: 45 seconds — ABNORMAL HIGH (ref 24–37)

## 2015-06-25 ENCOUNTER — Encounter (HOSPITAL_COMMUNITY): Payer: Self-pay | Admitting: *Deleted

## 2015-06-25 ENCOUNTER — Inpatient Hospital Stay (HOSPITAL_COMMUNITY): Payer: Medicare Other | Admitting: Anesthesiology

## 2015-06-25 ENCOUNTER — Other Ambulatory Visit (HOSPITAL_COMMUNITY): Payer: Self-pay | Admitting: *Deleted

## 2015-06-25 ENCOUNTER — Encounter (HOSPITAL_COMMUNITY)
Admission: RE | Disposition: A | Discharge status: Skilled Nursing Facility | Payer: Self-pay | Source: Ambulatory Visit | Attending: Orthopedic Surgery

## 2015-06-25 ENCOUNTER — Inpatient Hospital Stay (HOSPITAL_COMMUNITY)
Admission: RE | Admit: 2015-06-25 | Discharge: 2015-06-29 | DRG: 908 | Disposition: A | Discharge status: Skilled Nursing Facility | Payer: Medicare Other | Source: Ambulatory Visit | Attending: Orthopedic Surgery | Admitting: Orthopedic Surgery

## 2015-06-25 DIAGNOSIS — F0390 Unspecified dementia without behavioral disturbance: Secondary | ICD-10-CM | POA: Diagnosis present

## 2015-06-25 DIAGNOSIS — I5042 Chronic combined systolic (congestive) and diastolic (congestive) heart failure: Secondary | ICD-10-CM | POA: Diagnosis present

## 2015-06-25 DIAGNOSIS — Z885 Allergy status to narcotic agent status: Secondary | ICD-10-CM

## 2015-06-25 DIAGNOSIS — S76191A Other specified injury of right quadriceps muscle, fascia and tendon, initial encounter: Secondary | ICD-10-CM | POA: Diagnosis not present

## 2015-06-25 DIAGNOSIS — E039 Hypothyroidism, unspecified: Secondary | ICD-10-CM | POA: Diagnosis present

## 2015-06-25 DIAGNOSIS — G8918 Other acute postprocedural pain: Secondary | ICD-10-CM | POA: Diagnosis not present

## 2015-06-25 DIAGNOSIS — Z8673 Personal history of transient ischemic attack (TIA), and cerebral infarction without residual deficits: Secondary | ICD-10-CM | POA: Diagnosis not present

## 2015-06-25 DIAGNOSIS — Z7901 Long term (current) use of anticoagulants: Secondary | ICD-10-CM | POA: Diagnosis not present

## 2015-06-25 DIAGNOSIS — Z01812 Encounter for preprocedural laboratory examination: Secondary | ICD-10-CM | POA: Diagnosis not present

## 2015-06-25 DIAGNOSIS — S86191A Other injury of other muscle(s) and tendon(s) of posterior muscle group at lower leg level, right leg, initial encounter: Secondary | ICD-10-CM | POA: Diagnosis not present

## 2015-06-25 DIAGNOSIS — G629 Polyneuropathy, unspecified: Secondary | ICD-10-CM | POA: Diagnosis not present

## 2015-06-25 DIAGNOSIS — Z952 Presence of prosthetic heart valve: Secondary | ICD-10-CM | POA: Diagnosis not present

## 2015-06-25 DIAGNOSIS — M797 Fibromyalgia: Secondary | ICD-10-CM | POA: Diagnosis not present

## 2015-06-25 DIAGNOSIS — M1990 Unspecified osteoarthritis, unspecified site: Secondary | ICD-10-CM | POA: Diagnosis present

## 2015-06-25 DIAGNOSIS — X58XXXA Exposure to other specified factors, initial encounter: Secondary | ICD-10-CM | POA: Diagnosis present

## 2015-06-25 DIAGNOSIS — I480 Paroxysmal atrial fibrillation: Secondary | ICD-10-CM | POA: Diagnosis not present

## 2015-06-25 DIAGNOSIS — N183 Chronic kidney disease, stage 3 (moderate): Secondary | ICD-10-CM | POA: Diagnosis not present

## 2015-06-25 DIAGNOSIS — N179 Acute kidney failure, unspecified: Secondary | ICD-10-CM | POA: Diagnosis not present

## 2015-06-25 DIAGNOSIS — M66269 Spontaneous rupture of extensor tendons, unspecified lower leg: Secondary | ICD-10-CM | POA: Diagnosis not present

## 2015-06-25 DIAGNOSIS — J45909 Unspecified asthma, uncomplicated: Secondary | ICD-10-CM | POA: Diagnosis present

## 2015-06-25 DIAGNOSIS — K219 Gastro-esophageal reflux disease without esophagitis: Secondary | ICD-10-CM | POA: Diagnosis not present

## 2015-06-25 DIAGNOSIS — H9190 Unspecified hearing loss, unspecified ear: Secondary | ICD-10-CM | POA: Diagnosis not present

## 2015-06-25 DIAGNOSIS — N189 Chronic kidney disease, unspecified: Secondary | ICD-10-CM | POA: Diagnosis present

## 2015-06-25 DIAGNOSIS — I495 Sick sinus syndrome: Secondary | ICD-10-CM | POA: Diagnosis present

## 2015-06-25 DIAGNOSIS — G4730 Sleep apnea, unspecified: Secondary | ICD-10-CM | POA: Diagnosis not present

## 2015-06-25 DIAGNOSIS — M545 Low back pain: Secondary | ICD-10-CM | POA: Diagnosis not present

## 2015-06-25 DIAGNOSIS — I429 Cardiomyopathy, unspecified: Secondary | ICD-10-CM | POA: Diagnosis not present

## 2015-06-25 DIAGNOSIS — Z86711 Personal history of pulmonary embolism: Secondary | ICD-10-CM | POA: Diagnosis not present

## 2015-06-25 DIAGNOSIS — I129 Hypertensive chronic kidney disease with stage 1 through stage 4 chronic kidney disease, or unspecified chronic kidney disease: Secondary | ICD-10-CM | POA: Diagnosis present

## 2015-06-25 DIAGNOSIS — E785 Hyperlipidemia, unspecified: Secondary | ICD-10-CM | POA: Diagnosis present

## 2015-06-25 DIAGNOSIS — Z9981 Dependence on supplemental oxygen: Secondary | ICD-10-CM | POA: Diagnosis not present

## 2015-06-25 DIAGNOSIS — R262 Difficulty in walking, not elsewhere classified: Secondary | ICD-10-CM | POA: Diagnosis not present

## 2015-06-25 DIAGNOSIS — G8929 Other chronic pain: Secondary | ICD-10-CM | POA: Diagnosis not present

## 2015-06-25 DIAGNOSIS — M66261 Spontaneous rupture of extensor tendons, right lower leg: Secondary | ICD-10-CM | POA: Diagnosis not present

## 2015-06-25 DIAGNOSIS — S86811S Strain of other muscle(s) and tendon(s) at lower leg level, right leg, sequela: Secondary | ICD-10-CM

## 2015-06-25 DIAGNOSIS — Z87442 Personal history of urinary calculi: Secondary | ICD-10-CM | POA: Diagnosis not present

## 2015-06-25 DIAGNOSIS — S86891D Other injury of other muscle(s) and tendon(s) at lower leg level, right leg, subsequent encounter: Secondary | ICD-10-CM | POA: Diagnosis not present

## 2015-06-25 DIAGNOSIS — Z888 Allergy status to other drugs, medicaments and biological substances status: Secondary | ICD-10-CM | POA: Diagnosis not present

## 2015-06-25 DIAGNOSIS — Z79899 Other long term (current) drug therapy: Secondary | ICD-10-CM

## 2015-06-25 DIAGNOSIS — M25561 Pain in right knee: Secondary | ICD-10-CM | POA: Diagnosis not present

## 2015-06-25 DIAGNOSIS — Z4789 Encounter for other orthopedic aftercare: Secondary | ICD-10-CM | POA: Diagnosis not present

## 2015-06-25 DIAGNOSIS — S86819A Strain of other muscle(s) and tendon(s) at lower leg level, unspecified leg, initial encounter: Secondary | ICD-10-CM | POA: Diagnosis present

## 2015-06-25 DIAGNOSIS — Z950 Presence of cardiac pacemaker: Secondary | ICD-10-CM | POA: Diagnosis not present

## 2015-06-25 DIAGNOSIS — Z87440 Personal history of urinary (tract) infections: Secondary | ICD-10-CM

## 2015-06-25 DIAGNOSIS — M6281 Muscle weakness (generalized): Secondary | ICD-10-CM | POA: Diagnosis not present

## 2015-06-25 HISTORY — PX: PATELLAR TENDON REPAIR: SHX737

## 2015-06-25 LAB — CREATININE, SERUM
Creatinine, Ser: 2.17 mg/dL — ABNORMAL HIGH (ref 0.44–1.00)
GFR calc Af Amer: 23 mL/min — ABNORMAL LOW (ref >60)
GFR, EST NON AFRICAN AMERICAN: 20 mL/min — AB (ref >60)

## 2015-06-25 LAB — PROTIME-INR
INR: 2.58 — ABNORMAL HIGH (ref 0.00–1.49)
PROTHROMBIN TIME: 27.4 s — AB (ref 11.6–15.2)

## 2015-06-25 LAB — CBC
HEMATOCRIT: 38 % (ref 36.0–46.0)
HEMOGLOBIN: 12.5 g/dL (ref 12.0–15.0)
MCH: 29.4 pg (ref 26.0–34.0)
MCHC: 32.9 g/dL (ref 30.0–36.0)
MCV: 89.4 fL (ref 78.0–100.0)
Platelets: 193 10*3/uL (ref 150–400)
RBC: 4.25 MIL/uL (ref 3.87–5.11)
RDW: 15.1 % (ref 11.5–15.5)
WBC: 4.4 10*3/uL (ref 4.0–10.5)

## 2015-06-25 LAB — TYPE AND SCREEN
ABO/RH(D): O POS
ANTIBODY SCREEN: NEGATIVE

## 2015-06-25 LAB — APTT: APTT: 46 s — AB (ref 24–37)

## 2015-06-25 SURGERY — REPAIR, TENDON, PATELLAR
Anesthesia: General | Site: Knee | Laterality: Right

## 2015-06-25 MED ORDER — FENTANYL CITRATE (PF) 100 MCG/2ML IJ SOLN
INTRAMUSCULAR | Status: DC | PRN
  Administered 2015-06-25 (×2): 50 ug via INTRAVENOUS

## 2015-06-25 MED ORDER — MAGNESIUM CITRATE PO SOLN
1.0000 | Freq: Once | ORAL | Status: DC | PRN
Start: 2015-06-25 — End: 2015-06-29

## 2015-06-25 MED ORDER — FENTANYL CITRATE (PF) 100 MCG/2ML IJ SOLN
INTRAMUSCULAR | Status: AC
  Filled 2015-06-25: qty 2

## 2015-06-25 MED ORDER — OXYCODONE HCL 5 MG/5ML PO SOLN
5.0000 mg | Freq: Once | ORAL | Status: DC | PRN
  Filled 2015-06-25: qty 5

## 2015-06-25 MED ORDER — ONDANSETRON HCL 4 MG PO TABS
4.0000 mg | ORAL_TABLET | Freq: Four times a day (QID) | ORAL | Status: DC | PRN
  Administered 2015-06-26: 4 mg via ORAL
  Filled 2015-06-25: qty 1

## 2015-06-25 MED ORDER — DONEPEZIL HCL 10 MG PO TABS
10.0000 mg | ORAL_TABLET | Freq: Every day | ORAL | Status: DC
  Administered 2015-06-25 – 2015-06-28 (×4): 10 mg via ORAL
  Filled 2015-06-25 (×5): qty 1

## 2015-06-25 MED ORDER — HYDROMORPHONE HCL 1 MG/ML IJ SOLN
0.5000 mg | INTRAMUSCULAR | Status: DC | PRN
  Administered 2015-06-25 – 2015-06-26 (×2): 1 mg via INTRAVENOUS
  Filled 2015-06-25 (×2): qty 1

## 2015-06-25 MED ORDER — NON FORMULARY: 20.0000 mg | Freq: Every day | Status: DC

## 2015-06-25 MED ORDER — OXYCODONE HCL 5 MG PO TABS: 5.0000 mg | ORAL_TABLET | Freq: Once | ORAL | Status: DC | PRN

## 2015-06-25 MED ORDER — CEFAZOLIN SODIUM-DEXTROSE 2-3 GM-% IV SOLR
INTRAVENOUS | Status: AC
  Filled 2015-06-25: qty 50

## 2015-06-25 MED ORDER — BUPIVACAINE-EPINEPHRINE (PF) 0.5% -1:200000 IJ SOLN
INTRAMUSCULAR | Status: DC | PRN
  Administered 2015-06-25: 25 mL via PERINEURAL

## 2015-06-25 MED ORDER — NITROGLYCERIN 0.4 MG SL SUBL: 0.4000 mg | SUBLINGUAL_TABLET | SUBLINGUAL | Status: DC | PRN

## 2015-06-25 MED ORDER — SODIUM CHLORIDE 0.9 % IV SOLN
510.0000 mg | INTRAVENOUS | Status: DC
Start: 2015-06-25 — End: 2015-06-25

## 2015-06-25 MED ORDER — BUPIVACAINE-EPINEPHRINE (PF) 0.25% -1:200000 IJ SOLN
INTRAMUSCULAR | Status: AC
  Filled 2015-06-25: qty 30

## 2015-06-25 MED ORDER — LACTATED RINGERS IV SOLN
INTRAVENOUS | Status: DC | PRN
  Administered 2015-06-25 (×2): via INTRAVENOUS

## 2015-06-25 MED ORDER — BUPIVACAINE-EPINEPHRINE (PF) 0.25% -1:200000 IJ SOLN: INTRAMUSCULAR | Status: DC | PRN

## 2015-06-25 MED ORDER — PRAVASTATIN SODIUM 40 MG PO TABS
40.0000 mg | ORAL_TABLET | Freq: Every day | ORAL | Status: DC
  Administered 2015-06-25 – 2015-06-28 (×4): 40 mg via ORAL
  Filled 2015-06-25 (×5): qty 1

## 2015-06-25 MED ORDER — COLCHICINE 0.6 MG PO TABS
0.3000 mg | ORAL_TABLET | Freq: Every day | ORAL | Status: DC | PRN
  Filled 2015-06-25: qty 0.5

## 2015-06-25 MED ORDER — OMEPRAZOLE 20 MG PO CPDR
20.0000 mg | DELAYED_RELEASE_CAPSULE | Freq: Every day | ORAL | Status: DC
  Administered 2015-06-26 – 2015-06-29 (×4): 20 mg via ORAL
  Filled 2015-06-25 (×6): qty 1

## 2015-06-25 MED ORDER — ACETAMINOPHEN 160 MG/5ML PO SOLN: 325.0000 mg | ORAL | Status: DC | PRN

## 2015-06-25 MED ORDER — DEXAMETHASONE SODIUM PHOSPHATE 10 MG/ML IJ SOLN
10.0000 mg | Freq: Once | INTRAMUSCULAR | Status: DC
  Filled 2015-06-25 (×2): qty 1

## 2015-06-25 MED ORDER — ACETAMINOPHEN 325 MG PO TABS: 325.0000 mg | ORAL_TABLET | ORAL | Status: DC | PRN

## 2015-06-25 MED ORDER — TRANEXAMIC ACID 1000 MG/10ML IV SOLN
1000.0000 mg | INTRAVENOUS | Status: DC | PRN
  Administered 2015-06-25: 1000 mg via INTRAVENOUS

## 2015-06-25 MED ORDER — LORATADINE 10 MG PO TABS
10.0000 mg | ORAL_TABLET | Freq: Every day | ORAL | Status: DC
  Administered 2015-06-26 – 2015-06-29 (×4): 10 mg via ORAL
  Filled 2015-06-25 (×4): qty 1

## 2015-06-25 MED ORDER — CELECOXIB 200 MG PO CAPS
200.0000 mg | ORAL_CAPSULE | Freq: Two times a day (BID) | ORAL | Status: DC
  Administered 2015-06-25 – 2015-06-26 (×2): 200 mg via ORAL
  Filled 2015-06-25 (×3): qty 1

## 2015-06-25 MED ORDER — METHOCARBAMOL 500 MG PO TABS: 500.0000 mg | ORAL_TABLET | Freq: Four times a day (QID) | ORAL | Status: DC | PRN

## 2015-06-25 MED ORDER — LINACLOTIDE 290 MCG PO CAPS
290.0000 ug | ORAL_CAPSULE | ORAL | Status: DC
  Administered 2015-06-27 – 2015-06-29 (×2): 290 ug via ORAL
  Filled 2015-06-25 (×2): qty 1

## 2015-06-25 MED ORDER — AMIODARONE HCL 100 MG PO TABS
50.0000 mg | ORAL_TABLET | Freq: Every morning | ORAL | Status: DC
  Administered 2015-06-26 – 2015-06-29 (×4): 50 mg via ORAL
  Filled 2015-06-25 (×4): qty 1

## 2015-06-25 MED ORDER — HYDRALAZINE HCL 10 MG PO TABS
10.0000 mg | ORAL_TABLET | Freq: Three times a day (TID) | ORAL | Status: DC
  Administered 2015-06-25 – 2015-06-29 (×7): 10 mg via ORAL
  Filled 2015-06-25 (×13): qty 1

## 2015-06-25 MED ORDER — FEBUXOSTAT 40 MG PO TABS
40.0000 mg | ORAL_TABLET | Freq: Every evening | ORAL | Status: DC
  Administered 2015-06-25 – 2015-06-28 (×4): 40 mg via ORAL
  Filled 2015-06-25 (×5): qty 1

## 2015-06-25 MED ORDER — SODIUM CHLORIDE 0.9 % IJ SOLN: INTRAMUSCULAR | Status: DC | PRN

## 2015-06-25 MED ORDER — FERROUS SULFATE 325 (65 FE) MG PO TABS
325.0000 mg | ORAL_TABLET | Freq: Three times a day (TID) | ORAL | Status: DC
  Administered 2015-06-26 – 2015-06-29 (×11): 325 mg via ORAL
  Filled 2015-06-25 (×13): qty 1

## 2015-06-25 MED ORDER — BUPIVACAINE-EPINEPHRINE (PF) 0.5% -1:200000 IJ SOLN
INTRAMUSCULAR | Status: AC
  Filled 2015-06-25: qty 30

## 2015-06-25 MED ORDER — METOCLOPRAMIDE HCL 5 MG/ML IJ SOLN: 5.0000 mg | Freq: Three times a day (TID) | INTRAMUSCULAR | Status: DC | PRN

## 2015-06-25 MED ORDER — SUCCINYLCHOLINE CHLORIDE 20 MG/ML IJ SOLN
INTRAMUSCULAR | Status: DC | PRN
  Administered 2015-06-25: 80 mg via INTRAVENOUS

## 2015-06-25 MED ORDER — PREGABALIN 75 MG PO CAPS
75.0000 mg | ORAL_CAPSULE | Freq: Two times a day (BID) | ORAL | Status: DC
  Administered 2015-06-25 – 2015-06-29 (×8): 75 mg via ORAL
  Filled 2015-06-25 (×8): qty 1

## 2015-06-25 MED ORDER — KETOROLAC TROMETHAMINE 30 MG/ML IJ SOLN: INTRAMUSCULAR | Status: DC | PRN

## 2015-06-25 MED ORDER — ENOXAPARIN SODIUM 40 MG/0.4ML ~~LOC~~ SOLN: 40.0000 mg | SUBCUTANEOUS | Status: DC

## 2015-06-25 MED ORDER — CALCITRIOL 0.25 MCG PO CAPS
0.2500 ug | ORAL_CAPSULE | Freq: Every morning | ORAL | Status: DC
  Administered 2015-06-26 – 2015-06-29 (×4): 0.25 ug via ORAL
  Filled 2015-06-25 (×4): qty 1

## 2015-06-25 MED ORDER — PROPOFOL 10 MG/ML IV BOLUS
INTRAVENOUS | Status: AC
  Filled 2015-06-25: qty 20

## 2015-06-25 MED ORDER — PHENYLEPHRINE HCL 10 MG/ML IJ SOLN
INTRAMUSCULAR | Status: DC | PRN
  Administered 2015-06-25: 40 ug via INTRAVENOUS
  Administered 2015-06-25 (×2): 80 ug via INTRAVENOUS

## 2015-06-25 MED ORDER — BISACODYL 10 MG RE SUPP: 10.0000 mg | Freq: Every day | RECTAL | Status: DC | PRN

## 2015-06-25 MED ORDER — DEXAMETHASONE SODIUM PHOSPHATE 10 MG/ML IJ SOLN
10.0000 mg | Freq: Once | INTRAMUSCULAR | Status: AC
  Administered 2015-06-25: 10 mg via INTRAVENOUS

## 2015-06-25 MED ORDER — LIDOCAINE HCL (CARDIAC) 20 MG/ML IV SOLN
INTRAVENOUS | Status: DC | PRN
  Administered 2015-06-25: 50 mg via INTRAVENOUS

## 2015-06-25 MED ORDER — METOCLOPRAMIDE HCL 10 MG PO TABS: 5.0000 mg | ORAL_TABLET | Freq: Three times a day (TID) | ORAL | Status: DC | PRN

## 2015-06-25 MED ORDER — METHOCARBAMOL 500 MG PO TABS
500.0000 mg | ORAL_TABLET | Freq: Four times a day (QID) | ORAL | Status: AC | PRN
  Filled 2015-06-25: qty 1

## 2015-06-25 MED ORDER — ONDANSETRON HCL 4 MG/2ML IJ SOLN: 4.0000 mg | Freq: Four times a day (QID) | INTRAMUSCULAR | Status: DC | PRN

## 2015-06-25 MED ORDER — LEVOTHYROXINE SODIUM 112 MCG PO TABS
112.0000 ug | ORAL_TABLET | Freq: Every day | ORAL | Status: DC
  Administered 2015-06-26 – 2015-06-29 (×4): 112 ug via ORAL
  Filled 2015-06-25 (×6): qty 1

## 2015-06-25 MED ORDER — PROPOFOL 10 MG/ML IV BOLUS
INTRAVENOUS | Status: DC | PRN
  Administered 2015-06-25: 20 mg via INTRAVENOUS
  Administered 2015-06-25: 80 mg via INTRAVENOUS
  Administered 2015-06-25: 30 mg via INTRAVENOUS
  Administered 2015-06-25: 20 mg via INTRAVENOUS

## 2015-06-25 MED ORDER — CEFAZOLIN SODIUM-DEXTROSE 2-3 GM-% IV SOLR
2.0000 g | Freq: Four times a day (QID) | INTRAVENOUS | Status: AC
  Administered 2015-06-25: 2 g via INTRAVENOUS
  Filled 2015-06-25: qty 50

## 2015-06-25 MED ORDER — MEMANTINE HCL 10 MG PO TABS
10.0000 mg | ORAL_TABLET | Freq: Two times a day (BID) | ORAL | Status: DC
  Administered 2015-06-25 – 2015-06-29 (×8): 10 mg via ORAL
  Filled 2015-06-25 (×9): qty 1

## 2015-06-25 MED ORDER — MENTHOL 3 MG MT LOZG: 1.0000 | LOZENGE | OROMUCOSAL | Status: DC | PRN

## 2015-06-25 MED ORDER — DULOXETINE HCL 60 MG PO CPEP
60.0000 mg | ORAL_CAPSULE | Freq: Every evening | ORAL | Status: DC
  Administered 2015-06-25 – 2015-06-28 (×4): 60 mg via ORAL
  Filled 2015-06-25 (×5): qty 1

## 2015-06-25 MED ORDER — FENTANYL CITRATE (PF) 100 MCG/2ML IJ SOLN
25.0000 ug | INTRAMUSCULAR | Status: DC | PRN
  Administered 2015-06-25 (×2): 25 ug via INTRAVENOUS
  Administered 2015-06-25 (×2): 50 ug via INTRAVENOUS

## 2015-06-25 MED ORDER — SODIUM CHLORIDE 0.9 % IV SOLN
INTRAVENOUS | Status: DC
  Administered 2015-06-25: via INTRAVENOUS
  Filled 2015-06-25 (×10): qty 1000

## 2015-06-25 MED ORDER — WARFARIN - PHARMACIST DOSING INPATIENT: Freq: Every day | Status: DC

## 2015-06-25 MED ORDER — POLYETHYLENE GLYCOL 3350 17 G PO PACK
17.0000 g | PACK | Freq: Two times a day (BID) | ORAL | Status: DC
  Administered 2015-06-26 – 2015-06-29 (×7): 17 g via ORAL

## 2015-06-25 MED ORDER — DIPHENHYDRAMINE HCL 25 MG PO CAPS
25.0000 mg | ORAL_CAPSULE | Freq: Four times a day (QID) | ORAL | Status: DC | PRN
  Administered 2015-06-25: 25 mg via ORAL
  Filled 2015-06-25: qty 1

## 2015-06-25 MED ORDER — DOCUSATE SODIUM 100 MG PO CAPS
100.0000 mg | ORAL_CAPSULE | Freq: Two times a day (BID) | ORAL | Status: DC
Start: 2015-06-25 — End: 2015-06-29
  Administered 2015-06-25 – 2015-06-29 (×8): 100 mg via ORAL

## 2015-06-25 MED ORDER — CHLORHEXIDINE GLUCONATE 4 % EX LIQD: 60.0000 mL | Freq: Once | CUTANEOUS | Status: DC

## 2015-06-25 MED ORDER — ALUM & MAG HYDROXIDE-SIMETH 200-200-20 MG/5ML PO SUSP: 30.0000 mL | ORAL | Status: DC | PRN

## 2015-06-25 MED ORDER — KETOROLAC TROMETHAMINE 30 MG/ML IJ SOLN
INTRAMUSCULAR | Status: AC
  Filled 2015-06-25: qty 1

## 2015-06-25 MED ORDER — HYDROCODONE-ACETAMINOPHEN 7.5-325 MG PO TABS
1.0000 | ORAL_TABLET | ORAL | Status: DC
  Administered 2015-06-25 – 2015-06-27 (×6): 2 via ORAL
  Administered 2015-06-27: 1 via ORAL
  Administered 2015-06-27: 2 via ORAL
  Administered 2015-06-27 – 2015-06-29 (×8): 1 via ORAL
  Filled 2015-06-25 (×3): qty 2
  Filled 2015-06-25: qty 1
  Filled 2015-06-25: qty 2
  Filled 2015-06-25 (×2): qty 1
  Filled 2015-06-25: qty 2
  Filled 2015-06-25: qty 1
  Filled 2015-06-25: qty 2
  Filled 2015-06-25: qty 1
  Filled 2015-06-25 (×2): qty 2
  Filled 2015-06-25 (×3): qty 1
  Filled 2015-06-25 (×2): qty 2
  Filled 2015-06-25: qty 1

## 2015-06-25 MED ORDER — PHENOL 1.4 % MT LIQD
1.0000 | OROMUCOSAL | Status: DC | PRN
  Filled 2015-06-25: qty 177

## 2015-06-25 MED ORDER — TORSEMIDE 20 MG PO TABS
20.0000 mg | ORAL_TABLET | Freq: Two times a day (BID) | ORAL | Status: DC
  Administered 2015-06-26 – 2015-06-29 (×7): 20 mg via ORAL
  Filled 2015-06-25 (×10): qty 1

## 2015-06-25 MED ORDER — LACTATED RINGERS IV SOLN
INTRAVENOUS | Status: DC
  Administered 2015-06-25: 1000 mL via INTRAVENOUS

## 2015-06-25 MED ORDER — SODIUM CHLORIDE 0.9 % IJ SOLN
INTRAMUSCULAR | Status: AC
  Filled 2015-06-25: qty 50

## 2015-06-25 MED ORDER — LINACLOTIDE 145 MCG PO CAPS
145.0000 ug | ORAL_CAPSULE | ORAL | Status: DC
  Administered 2015-06-26 – 2015-06-28 (×2): 145 ug via ORAL
  Filled 2015-06-25 (×2): qty 1

## 2015-06-25 MED ORDER — CEFAZOLIN SODIUM-DEXTROSE 2-3 GM-% IV SOLR
2.0000 g | INTRAVENOUS | Status: AC
  Administered 2015-06-25: 2 g via INTRAVENOUS

## 2015-06-25 MED ORDER — DEXTROSE 5 % IV SOLN
500.0000 mg | Freq: Four times a day (QID) | INTRAVENOUS | Status: AC | PRN
  Administered 2015-06-25: 500 mg via INTRAVENOUS
  Filled 2015-06-25 (×2): qty 5

## 2015-06-25 MED ORDER — ONDANSETRON HCL 4 MG/2ML IJ SOLN
INTRAMUSCULAR | Status: DC | PRN
  Administered 2015-06-25: 4 mg via INTRAVENOUS

## 2015-06-25 MED ORDER — CARVEDILOL 12.5 MG PO TABS
12.5000 mg | ORAL_TABLET | Freq: Two times a day (BID) | ORAL | Status: DC
  Administered 2015-06-25 – 2015-06-29 (×8): 12.5 mg via ORAL
  Filled 2015-06-25 (×9): qty 1

## 2015-06-25 MED ORDER — TRANEXAMIC ACID 1000 MG/10ML IV SOLN
2000.0000 mg | Freq: Once | INTRAVENOUS | Status: DC
  Filled 2015-06-25: qty 20

## 2015-06-25 SURGICAL SUPPLY — 47 items
BAG SPEC THK2 15X12 ZIP CLS (MISCELLANEOUS) ×1
BAG ZIPLOCK 12X15 (MISCELLANEOUS) ×2 IMPLANT
BANDAGE ACE 6X5 VEL STRL LF (GAUZE/BANDAGES/DRESSINGS) ×3 IMPLANT
BANDAGE ESMARK 6X9 LF (GAUZE/BANDAGES/DRESSINGS) ×1 IMPLANT
BIT DRILL 2.4X128 (BIT) ×2 IMPLANT
BIT DRILL 2.4X128MM (BIT) ×1
BNDG CMPR 9X6 STRL LF SNTH (GAUZE/BANDAGES/DRESSINGS) ×1
BNDG ESMARK 6X9 LF (GAUZE/BANDAGES/DRESSINGS) ×3
CUFF TOURN SGL QUICK 34 (TOURNIQUET CUFF) ×3
CUFF TRNQT CYL 34X4X40X1 (TOURNIQUET CUFF) ×1 IMPLANT
DRAPE EXTREMITY T 121X128X90 (DRAPE) ×3 IMPLANT
DRAPE U-SHAPE 47X51 STRL (DRAPES) ×3 IMPLANT
DRSG AQUACEL AG ADV 3.5X10 (GAUZE/BANDAGES/DRESSINGS) ×3 IMPLANT
DURAPREP 26ML APPLICATOR (WOUND CARE) ×3 IMPLANT
ELECT REM PT RETURN 9FT ADLT (ELECTROSURGICAL) ×3
ELECTRODE REM PT RTRN 9FT ADLT (ELECTROSURGICAL) ×1 IMPLANT
FACESHIELD WRAPAROUND (MASK) ×6 IMPLANT
FACESHIELD WRAPAROUND OR TEAM (MASK) ×2 IMPLANT
GLOVE BIOGEL M 7.0 STRL (GLOVE) IMPLANT
GLOVE BIOGEL PI IND STRL 7.5 (GLOVE) ×1 IMPLANT
GLOVE BIOGEL PI IND STRL 8.5 (GLOVE) ×1 IMPLANT
GLOVE BIOGEL PI INDICATOR 7.5 (GLOVE) ×2
GLOVE BIOGEL PI INDICATOR 8.5 (GLOVE) ×2
GLOVE ECLIPSE 8.0 STRL XLNG CF (GLOVE) ×3 IMPLANT
GLOVE ORTHO TXT STRL SZ7.5 (GLOVE) ×3 IMPLANT
GOWN STRL REUS W/TWL LRG LVL3 (GOWN DISPOSABLE) ×3 IMPLANT
GOWN STRL REUS W/TWL XL LVL3 (GOWN DISPOSABLE) ×3 IMPLANT
IMMOBILIZER KNEE 20 (SOFTGOODS) ×3
IMMOBILIZER KNEE 20 THIGH 36 (SOFTGOODS) IMPLANT
KIT BASIN OR (CUSTOM PROCEDURE TRAY) ×3 IMPLANT
LIQUID BAND (GAUZE/BANDAGES/DRESSINGS) ×3 IMPLANT
MANIFOLD NEPTUNE II (INSTRUMENTS) ×3 IMPLANT
PACK TOTAL JOINT (CUSTOM PROCEDURE TRAY) ×3 IMPLANT
PASSER SUT SWANSON 36MM LOOP (INSTRUMENTS) ×3 IMPLANT
POSITIONER SURGICAL ARM (MISCELLANEOUS) ×3 IMPLANT
SUT ETHIBOND NAB CT1 #1 30IN (SUTURE) IMPLANT
SUT FIBERWIRE #2 38 T-5 BLUE (SUTURE) ×6
SUT MNCRL AB 4-0 PS2 18 (SUTURE) ×3 IMPLANT
SUT VIC AB 1 CT1 36 (SUTURE) ×6 IMPLANT
SUT VIC AB 2-0 CT1 27 (SUTURE) ×6
SUT VIC AB 2-0 CT1 TAPERPNT 27 (SUTURE) ×2 IMPLANT
SUT VLOC 180 0 24IN GS25 (SUTURE) ×2 IMPLANT
SUTURE FIBERWR #2 38 T-5 BLUE (SUTURE) ×2 IMPLANT
TOWEL OR 17X26 10 PK STRL BLUE (TOWEL DISPOSABLE) ×3 IMPLANT
TOWEL OR NON WOVEN STRL DISP B (DISPOSABLE) ×3 IMPLANT
TRAY FOLEY W/METER SILVER 14FR (SET/KITS/TRAYS/PACK) ×2 IMPLANT
WRAP KNEE MAXI GEL POST OP (GAUZE/BANDAGES/DRESSINGS) ×2 IMPLANT

## 2015-06-25 NOTE — Progress Notes (Signed)
Spoke to dr Charlann Boxer in [place], re feraheme.  He not aware Dr Allena Katz has ordered, patient t and son thought it was for tomorrow. Spoke to [place] in holding, not given in Short stay , and unable to get IV in.  Note left on chart also

## 2015-06-25 NOTE — Anesthesia Procedure Notes (Addendum)
Anesthesia Regional Block:  Femoral nerve block  Pre-Anesthetic Checklist: ,, timeout performed, Correct Patient, Correct Site, Correct Laterality, Correct Procedure, Correct Position, site marked, Risks and benefits discussed,  Surgical consent,  Pre-op evaluation,  At surgeon's request and post-op pain management  Laterality: Lower and Right  Prep: chloraprep       Needles:  Injection technique: Single-shot  Needle Type: Echogenic Stimulator Needle          Additional Needles:  Procedures: ultrasound guided (picture in chart) and nerve stimulator Femoral nerve block  Nerve Stimulator or Paresthesia:  Response: quad, 0.5 mA,   Additional Responses:   Narrative:  Injection made incrementally with aspirations every 5 mL.  Performed by: Personally  Anesthesiologist: MOSER, CHRISTOPHER  Additional Notes: H+P and labs reviewed, risks and benefits discussed with patient, procedure tolerated well without complications   Procedure Name: Intubation Performed by: Kizzie Fantasia Pre-anesthesia Checklist: Patient identified, Emergency Drugs available, Suction available, Patient being monitored and Timeout performed Patient Re-evaluated:Patient Re-evaluated prior to inductionOxygen Delivery Method: Circle system utilized Preoxygenation: Pre-oxygenation with 100% oxygen Intubation Type: IV induction Ventilation: Mask ventilation without difficulty Laryngoscope Size: Mac and 3 Grade View: Grade I Tube type: Oral Tube size: 7.0 mm Number of attempts: 1 Airway Equipment and Method: Stylet Placement Confirmation: ETT inserted through vocal cords under direct vision,  positive ETCO2,  CO2 detector and breath sounds checked- equal and bilateral Secured at: 21 cm Tube secured with: Tape Dental Injury: Teeth and Oropharynx as per pre-operative assessment

## 2015-06-25 NOTE — Progress Notes (Signed)
Assisted Dr. Moser with right, ultrasound guided, femoral block. Side rails up, monitors on throughout procedure. See vital signs in flow sheet. Tolerated Procedure well. 

## 2015-06-25 NOTE — Transfer of Care (Signed)
Immediate Anesthesia Transfer of Care Note  Patient: [name]  Procedure(s) Performed: Procedure(s): RIGHT PATELLA TENDON REVISION/REPAIR (Right)  Patient Location: PACU  Anesthesia Type:General  Level of Consciousness: sedated, patient cooperative and responds to stimulation  Airway & Oxygen Therapy: Patient Spontanous Breathing and Patient connected to face mask oxygen  Post-op Assessment: Report given to RN and Post -op Vital signs reviewed and stable  Post vital signs: Reviewed and stable  Last Vitals:  Filed Vitals:   06/25/15 1344  BP: 143/58  Pulse: 64  Temp: 36.8 C  Resp: 18    Complications: No apparent anesthesia complications

## 2015-06-25 NOTE — Interval H&P Note (Signed)
History and Physical Interval Note:  06/25/2015 2:45 PM  [name]  has presented today for surgery, with the diagnosis of reavulsion of right patella tendon  The various methods of treatment have been discussed with the patient and family. After consideration of risks, benefits and other options for treatment, the patient has consented to  Procedure(s): RIGHT PATELLA TENDON REVISION/REPAIR (Right) as a surgical intervention .  The patient's history has been reviewed, patient examined, no change in status, stable for surgery.  I have reviewed the patient's chart and labs.  Questions were answered to the patient's satisfaction.     [name]

## 2015-06-25 NOTE — Progress Notes (Signed)
ANTICOAGULATION CONSULT NOTE - Initial Consult  Pharmacy Consult for Coumadin Indication: atrial fibrillation, VTE prophylaxis  Allergies  Allergen Reactions  . Nubain [Nalbuphine Hcl] Hives    Went into cardiac arrest   . Codeine Hives and Nausea Only  . Darvocet [Propoxyphene N-Acetaminophen] Nausea Only    Patient Measurements: Height:  (147.3 cm) Weight: 180 lb (81.647 kg) IBW/kg (Calculated) : 40.9  Vital Signs: Temp: 97.6 F (36.4 C) (03/06 1930) Temp Source: Oral (03/06 1344) BP: 172/60 mmHg (03/06 1930) Pulse Rate: 62 (03/06 1930)  Labs:  Recent Labs  06/25/15 1420  APTT 46*  LABPROT 27.4*  INR 2.58*    Estimated Creatinine Clearance: 17.7 mL/min (by C-G formula based on Cr of 2.25).   Medical History: Past Medical History  Diagnosis Date  . Syncope   . Symptomatic bradycardia   . Chronic lower back pain   . Nonischemic cardiomyopathy (HCC)   . HTN (hypertension)   . HLD (hyperlipidemia)   . Atrial fibrillation (HCC)     tachy-brady syndrome with <1% recurrent PAF since pacemaker placement  . H/O: stroke   . Pulmonary embolism (HCC)     HISTORY OF, the pt. had a recurrent bilateral pulmonary emboli in 2005, on warfarin therapy and at which time she under went implantation of IVC filter  . Anemia   . Dementia   . Gastroesophageal reflux disease   . Aortic stenosis 10/13/2012    Low EF, low gradient with severe aortic stenosis confirmed by dobutamine stress echocardiogram s/p TAVR 12/2012  . PONV (postoperative nausea and vomiting)   . Osteoarthritis   . Neuropathy (HCC)   . Spinal stenosis   . Symptomatic bradycardia 2012    s/p Medtronic PPM  . Chronic combined systolic and diastolic CHF (congestive heart failure) (HCC)   . Fibromyalgia   . Presence of permanent cardiac pacemaker   . Shortness of breath dyspnea     with exertion   . Pneumonia     hx of x 3   . Acute on chronic renal failure [facility])     sees Dr Allena Katz   . CKD (chronic  kidney disease)   . Headache     hx of migraines   . On home oxygen therapy     patient uses at nite- 2L- has not used in > 6 months per patient  . Dysrhythmia   . Heart murmur   . Stroke (HCC)   . Asthma   . History of bronchitis   . Cataracts, bilateral   . Hard of hearing   . H/O dizziness   . Repeated falls   . Hypothyroidism   . History of kidney stones   . Urinary incontinence   . History of urinary tract infection   . H/O urinary frequency   . Urinary urgency   . History of blood transfusion   . Aortic regurgitation   . Sciatica   . Sleep apnea     uses oxygen at night and PRN- not used since > 6 months / DOES NOT USE  C-PAP    Medications:  Warfarin PTA 2.5mg  daily.  LD reported as 2/28.  Assessment: 26 yoF on chronic Coumadin for Afib.  She was admitted today for R patella tendon revision/repair.   She stopped Coumadin on 2/28 and transitioned to Lovenox, however INR was therapeutic (2.58) this afternoon.  No Vitamin K or FFP documented as given pre-op.   No excessive bleeding reported.    Goal of  Therapy:  INR 2-3 Monitor platelets by anticoagulation protocol: Yes   Plan:  No Coumadin tonight Daily INR D/C Lovenox for INR>1.8 per order instructions  Doran Durand [facility] 06/25/2015,7:45 PM

## 2015-06-25 NOTE — Anesthesia Preprocedure Evaluation (Signed)
Anesthesia Evaluation  Patient identified by MRN, date of birth, ID band Patient awake    Reviewed: Allergy & Precautions, NPO status , Patient's Chart, lab work & pertinent test results  History of Anesthesia Complications (+) PONV and history of anesthetic complications  Airway Mallampati: II  TM Distance: >3 FB Neck ROM: Full    Dental  (+) Edentulous Upper, Edentulous Lower   Pulmonary shortness of breath and Long-Term Oxygen Therapy, sleep apnea , PE   breath sounds clear to auscultation       Cardiovascular hypertension, Pt. on medications (-) angina+CHF  + dysrhythmias Atrial Fibrillation + pacemaker  Rhythm:Regular     Neuro/Psych  Headaches,  Neuromuscular disease CVA negative psych ROS   GI/Hepatic Neg liver ROS, GERD  Medicated and Controlled,  Endo/Other  Hypothyroidism   Renal/GU CRFRenal disease     Musculoskeletal  (+) Arthritis , Fibromyalgia -  Abdominal   Peds  Hematology  (+) anemia ,   Anesthesia Other Findings   Reproductive/Obstetrics                             Anesthesia Physical Anesthesia Plan  ASA: III  Anesthesia Plan: General   Post-op Pain Management: MAC Combined w/ Regional for Post-op pain   Induction: Intravenous  Airway Management Planned: LMA and Oral ETT  Additional Equipment: None  Intra-op Plan:   Post-operative Plan:   Informed Consent: I have reviewed the patients History and Physical, chart, labs and discussed the procedure including the risks, benefits and alternatives for the proposed anesthesia with the patient or authorized representative who has indicated his/her understanding and acceptance.   Dental advisory given  Plan Discussed with: CRNA and Surgeon  Anesthesia Plan Comments:         Anesthesia Quick Evaluation

## 2015-06-25 NOTE — Anesthesia Postprocedure Evaluation (Signed)
Anesthesia Post Note  Patient: [name]  Procedure(s) Performed: Procedure(s) (LRB): RIGHT PATELLA TENDON REVISION/REPAIR (Right)  Patient location during evaluation: PACU Anesthesia Type: General and Regional Level of consciousness: awake Pain management: pain level controlled Vital Signs Assessment: post-procedure vital signs reviewed and stable Respiratory status: spontaneous breathing, respiratory function stable and patient connected to nasal cannula oxygen Cardiovascular status: stable Postop Assessment: no signs of nausea or vomiting Anesthetic complications: no    Last Vitals:  Filed Vitals:   06/25/15 1915 06/25/15 1930  BP: 162/99 172/60  Pulse: 61 62  Temp:  36.4 C  Resp: 18 11    Last Pain:  Filed Vitals:   06/25/15 1931  PainSc: Asleep                 Zachery Niswander

## 2015-06-25 NOTE — Brief Op Note (Signed)
06/25/2015  7:28 PM  PATIENT:  [name]  82 y.o. female  PRE-OPERATIVE DIAGNOSIS:  reavulsion of right patella tendon  POST-OPERATIVE DIAGNOSIS:  reavulsion of right patella tendon  PROCEDURE:  Procedure(s): RIGHT PATELLA TENDON REVISION/REPAIR (Right)  SURGEON:  Surgeon(s) and Role:    * Durene Romans, MD - Primary  PHYSICIAN ASSISTANT: Lanney Gins, PA-C  ANESTHESIA:   general  EBL:   Minimal  BLOOD ADMINISTERED:none  DRAINS: none   LOCAL MEDICATIONS USED:  NONE  SPECIMEN:  No Specimen  DISPOSITION OF SPECIMEN:  N/A  COUNTS:  YES  TOURNIQUET:   Total Tourniquet Time Documented: Thigh (Right) - 60 minutes Total: Thigh (Right) - 60 minutes   DICTATION: .Other Dictation: Dictation Number [id number]  PLAN OF CARE: Admit to inpatient   PATIENT DISPOSITION:  PACU - hemodynamically stable.   Delay start of Pharmacological VTE agent (>24hrs) due to surgical blood loss or risk of bleeding: no

## 2015-06-26 ENCOUNTER — Ambulatory Visit (HOSPITAL_COMMUNITY): Payer: Medicare Other

## 2015-06-26 ENCOUNTER — Encounter (HOSPITAL_COMMUNITY): Payer: Self-pay | Admitting: Orthopedic Surgery

## 2015-06-26 LAB — CBC
HCT: 31.4 % — ABNORMAL LOW (ref 36.0–46.0)
Hemoglobin: 10 g/dL — ABNORMAL LOW (ref 12.0–15.0)
MCH: 29 pg (ref 26.0–34.0)
MCHC: 31.8 g/dL (ref 30.0–36.0)
MCV: 91 fL (ref 78.0–100.0)
PLATELETS: 214 10*3/uL (ref 150–400)
RBC: 3.45 MIL/uL — ABNORMAL LOW (ref 3.87–5.11)
RDW: 15.5 % (ref 11.5–15.5)
WBC: 5.9 10*3/uL (ref 4.0–10.5)

## 2015-06-26 LAB — BASIC METABOLIC PANEL
Anion gap: 10 (ref 5–15)
BUN: 54 mg/dL — AB (ref 6–20)
CHLORIDE: 104 mmol/L (ref 101–111)
CO2: 28 mmol/L (ref 22–32)
CREATININE: 2.44 mg/dL — AB (ref 0.44–1.00)
Calcium: 9.2 mg/dL (ref 8.9–10.3)
GFR calc Af Amer: 20 mL/min — ABNORMAL LOW (ref >60)
GFR calc non Af Amer: 17 mL/min — ABNORMAL LOW (ref >60)
Glucose, Bld: 142 mg/dL — ABNORMAL HIGH (ref 65–99)
Potassium: 4.6 mmol/L (ref 3.5–5.1)
SODIUM: 142 mmol/L (ref 135–145)

## 2015-06-26 LAB — PROTIME-INR
INR: 2.44 — ABNORMAL HIGH (ref 0.00–1.49)
PROTHROMBIN TIME: 26.2 s — AB (ref 11.6–15.2)

## 2015-06-26 NOTE — Progress Notes (Signed)
ANTICOAGULATION CONSULT NOTE - Initial Consult  Pharmacy Consult for Coumadin Indication: atrial fibrillation, VTE prophylaxis  Allergies  Allergen Reactions  . Nubain [Nalbuphine Hcl] Hives    Went into cardiac arrest   . Codeine Hives and Nausea Only  . Darvocet [Propoxyphene N-Acetaminophen] Nausea Only    Patient Measurements: Height:  (147.3 cm) Weight: 180 lb (81.647 kg) IBW/kg (Calculated) : 40.9  Vital Signs: Temp: 97.4 F (36.3 C) (03/07 1149) Temp Source: Oral (03/07 1149) BP: 112/41 mmHg (03/07 1149) Pulse Rate: 64 (03/07 1149)  Labs:  Recent Labs  06/25/15 1420 06/25/15 2112 06/26/15 0339  HGB  --  12.5 10.0*  HCT  --  38.0 31.4*  PLT  --  193 214  APTT 46*  --   --   LABPROT 27.4*  --  26.2*  INR 2.58*  --  2.44*  CREATININE  --  2.17* 2.44*    Estimated Creatinine Clearance: 16.3 mL/min (by C-G formula based on Cr of 2.44).   Medical History: Past Medical History  Diagnosis Date  . Syncope   . Symptomatic bradycardia   . Chronic lower back pain   . Nonischemic cardiomyopathy (HCC)   . HTN (hypertension)   . HLD (hyperlipidemia)   . Atrial fibrillation (HCC)     tachy-brady syndrome with <1% recurrent PAF since pacemaker placement  . H/O: stroke   . Pulmonary embolism (HCC)     HISTORY OF, the pt. had a recurrent bilateral pulmonary emboli in 2005, on warfarin therapy and at which time she under went implantation of IVC filter  . Anemia   . Dementia   . Gastroesophageal reflux disease   . Aortic stenosis 10/13/2012    Low EF, low gradient with severe aortic stenosis confirmed by dobutamine stress echocardiogram s/p TAVR 12/2012  . PONV (postoperative nausea and vomiting)   . Osteoarthritis   . Neuropathy (HCC)   . Spinal stenosis   . Symptomatic bradycardia 2012    s/p Medtronic PPM  . Chronic combined systolic and diastolic CHF (congestive heart failure) (HCC)   . Fibromyalgia   . Presence of permanent cardiac pacemaker   .  Shortness of breath dyspnea     with exertion   . Pneumonia     hx of x 3   . Acute on chronic renal failure [facility])     sees Dr Allena Katz   . CKD (chronic kidney disease)   . Headache     hx of migraines   . On home oxygen therapy     patient uses at nite- 2L- has not used in > 6 months per patient  . Dysrhythmia   . Heart murmur   . Stroke (HCC)   . Asthma   . History of bronchitis   . Cataracts, bilateral   . Hard of hearing   . H/O dizziness   . Repeated falls   . Hypothyroidism   . History of kidney stones   . Urinary incontinence   . History of urinary tract infection   . H/O urinary frequency   . Urinary urgency   . History of blood transfusion   . Aortic regurgitation   . Sciatica   . Sleep apnea     uses oxygen at night and PRN- not used since > 6 months / DOES NOT USE  C-PAP    Medications:  Warfarin PTA 2.5mg  daily.  LD reported as 2/28 (verified with son)  Assessment: [name] on chronic Coumadin for Afib.  She was admitted on 3/6 for R patella tendon revision/repair.   She stopped Coumadin on 2/28 and transitioned to Lovenox, however INR was therapeutic (2.58) on admission (3/6) No Vitamin K or FFP documented as given pre-op.   No excessive bleeding reported.   Today, 06/26/2015: - INR therapeutic at 2.44 - hgb down 10 (probably secondary to ABLA), plt stable - no bleeding documented   Goal of Therapy:  INR 2-3 Monitor platelets by anticoagulation protocol: Yes   Plan:  - Since INR remains therapeutic after 7 days off drug and concern for her clearing the drug adequately, will hold her warfarin dose today - f/u with AM labs and resume if/when appropriate   Erin Uecker P 06/26/2015,1:03 PM

## 2015-06-26 NOTE — Clinical Social Work Placement (Signed)
   CLINICAL SOCIAL WORK PLACEMENT  NOTE  Date:  06/26/2015  Patient Details  Name: [name] MRN: [id number] Date of Birth: 1932/05/01  Clinical Social Work is seeking post-discharge placement for this patient at the Skilled  Nursing Facility level of care (*CSW will initial, date and re-position this form in  chart as items are completed):  No   Patient/family provided with [facility] Clinical Social Work Department's list of facilities offering this level of care within the geographic area requested by the patient (or if unable, by the patient's family).  Yes   Patient/family informed of their freedom to choose among providers that offer the needed level of care, that participate in Medicare, Medicaid or managed care program needed by the patient, have an available bed and are willing to accept the patient.  Yes   Patient/family informed of [facility]'s ownership interest in [facility] and [facility], as well as of the fact that they are under no obligation to receive care at these facilities.  PASRR submitted to EDS on       PASRR number received on       Existing PASRR number confirmed on 06/26/15     FL2 transmitted to all facilities in geographic area requested by pt/family on 06/26/15     FL2 transmitted to all facilities within larger geographic area on       Patient informed that his/her managed care company has contracts with or will negotiate with certain facilities, including the following:        Yes   Patient/family informed of bed offers received.  Patient chooses bed at [facility]     Physician recommends and patient chooses bed at [facility]    Patient to be transferred to [facility] on  .  Patient to be transferred to facility by       Patient family notified on   of transfer.  Name of family member notified:        PHYSICIAN       Additional Comment:    _______________________________________________ Royetta Asal,  LCSW  [phone] 06/26/2015, 1:01 PM

## 2015-06-26 NOTE — Progress Notes (Signed)
Patient ID: [name], female   DOB: 1933-01-26, 82 y.o.   MRN: [id number] Subjective: 1 Day Post-Op Procedure(s) (LRB): RIGHT PATELLA TENDON REVISION/REPAIR (Right)    Patient reports pain as mild.  Remains confused (baseline).  No events  Objective:   VITALS:   Filed Vitals:   06/26/15 1149 06/26/15 1502  BP: 112/41 123/54  Pulse: 64 80  Temp: 97.4 F (36.3 C) 97.8 F (36.6 C)  Resp: 14 16    Neurovascular intact Incision: dressing C/D/I  LABS  Recent Labs  06/25/15 2112 06/26/15 0339  HGB 12.5 10.0*  HCT 38.0 31.4*  WBC 4.4 5.9  PLT 193 214     Recent Labs  06/25/15 2112 06/26/15 0339  NA  --  142  K  --  4.6  BUN  --  54*  CREATININE 2.17* 2.44*  GLUCOSE  --  142*     Recent Labs  06/25/15 1420 06/26/15 0339  INR 2.58* 2.44*     Assessment/Plan: 1 Day Post-Op Procedure(s) (LRB): RIGHT PATELLA TENDON REVISION/REPAIR (Right)   Advance diet Up with therapy Discharge to SNF probably tomorrow or Thursday depending on activity level progress  Will probably place in posterior long leg splint tomorrow to prevent and protect against compliance concerns related to memory

## 2015-06-26 NOTE — Progress Notes (Signed)
Physical Therapy Treatment Patient Details Name: [name] MRN: [id number] DOB: 11-09-1932 Today's Date: 06/26/2015    History of Present Illness Pt is an 82 year old female s/p right patella tendon revision/repair and PMHx of CVA, HTN, R TKA, previous R patellar tendon surgery    PT Comments    Pt assisted back to bed.  Pt continues to report R LE numbness.    Follow Up Recommendations  SNF     Equipment Recommendations  None recommended by PT    Recommendations for Other Services       Precautions / Restrictions Precautions Precautions: Knee;Fall Required Braces or Orthoses: Knee Immobilizer - Right Knee Immobilizer - Right: On at all times Restrictions RLE Weight Bearing: Weight bearing as tolerated    Mobility  Bed Mobility Overal bed mobility: Needs Assistance Bed Mobility: Sit to Supine     Supine to sit: Min assist;HOB elevated Sit to supine: Min assist   General bed mobility comments: assist for R LE  Transfers Overall transfer level: Needs assistance Equipment used: Rolling walker (2 wheeled) Transfers: Sit to/from [employer] Sit to Stand: Min assist Stand pivot transfers: Min assist       General transfer comment: verbal cues for safe technique, assist to rise and steady  Ambulation/Gait       Stairs            Wheelchair Mobility    Modified Rankin (Stroke Patients Only)       Balance                                    Cognition Arousal/Alertness: Awake/alert Behavior During Therapy: WFL for tasks assessed/performed Overall Cognitive Status: Within Functional Limits for tasks assessed                      Exercises      General Comments        Pertinent Vitals/Pain Pain Assessment: Faces Faces Pain Scale: Hurts even more Pain Location: R LE Pain Descriptors / Indicators: Aching;Sore Pain Intervention(s): Limited activity within patient's tolerance;Monitored during  session;Repositioned;Ice applied    Home Living Family/patient expects to be discharged to:: Skilled nursing facility             Home Equipment: Dan Humphreys - 2 wheels;Walker - 4 wheels;Bedside commode;Wheelchair - manual Additional Comments: Pt with plans to go to SNF at d/c, son lives with her and is not always available to assist pt     Prior Function Level of Independence: Independent with assistive device(s)      Comments: RW   PT Goals (current goals can now be found in the care plan section) Acute Rehab PT Goals Patient Stated Goal: get knee healed and not have any more trouble PT Goal Formulation: With patient Time For Goal Achievement: 07/03/15 Potential to Achieve Goals: Good Progress towards PT goals: Progressing toward goals    Frequency  Min 6X/week    PT Plan Current plan remains appropriate    Co-evaluation             End of Session Equipment Utilized During Treatment: Gait belt;Right knee immobilizer Activity Tolerance: Patient tolerated treatment well Patient left: with call bell/phone within reach;with bed alarm set;in bed     Time: 4098-1191 PT Time Calculation (min) (ACUTE ONLY): 11 min  Charges:  $Therapeutic Activity: 8-22 mins  G Codes:      Treylin Burtch,KATHrine E 2015-07-20, 4:03 PM Zenovia Jarred, PT, DPT 07-20-15 Pager: [phone]

## 2015-06-26 NOTE — Evaluation (Signed)
Physical Therapy Evaluation Patient Details Name: [name] MRN: [id number] DOB: 1932/06/30 Today's Date: 06/26/2015   History of Present Illness  Pt is an 82 year old female s/p right patella tendon revision/repair and PMHx of CVA, HTN, R TKA, previous R patellar tendon surgery  Clinical Impression  Patient is s/p above surgery resulting in functional limitations due to the deficits listed below (see PT Problem List).  Patient will benefit from skilled PT to increase their independence and safety with mobility to allow discharge to the venue listed below.   Pt reports she plans to d/c to SNF.     Follow Up Recommendations SNF    Equipment Recommendations  None recommended by PT    Recommendations for Other Services       Precautions / Restrictions Precautions Precautions: Knee;Fall Required Braces or Orthoses: Knee Immobilizer - Right Knee Immobilizer - Right: On at all times Restrictions RLE Weight Bearing: Weight bearing as tolerated      Mobility  Bed Mobility Overal bed mobility: Needs Assistance Bed Mobility: Supine to Sit     Supine to sit: Min assist;HOB elevated     General bed mobility comments: pt assisted using hand rail, assist provided for R LE  Transfers Overall transfer level: Needs assistance Equipment used: Rolling walker (2 wheeled) Transfers: Sit to/from Stand Sit to Stand: Mod assist;+2 physical assistance;From elevated surface         General transfer comment: verbal cues for safe technique  Ambulation/Gait Ambulation/Gait assistance: Min assist Ambulation Distance (Feet): 10 Feet Assistive device: Rolling walker (2 wheeled) Gait Pattern/deviations: Step-to pattern;Antalgic     General Gait Details: verbal cues for RW positioning, posture, step length, sequence, pt with R LE numbness so limited distance to within room only  Stairs            Wheelchair Mobility    Modified Rankin (Stroke Patients Only)        Balance                                             Pertinent Vitals/Pain Pain Assessment: Faces Faces Pain Scale: Hurts little more Pain Location: R leg Pain Descriptors / Indicators: Aching;Sore Pain Intervention(s): Limited activity within patient's tolerance;Monitored during session;Repositioned;Ice applied    Home Living Family/patient expects to be discharged to:: Skilled nursing facility               Home Equipment: Dan Humphreys - 2 wheels;Walker - 4 wheels;Bedside commode;Wheelchair - manual Additional Comments: Pt with plans to go to SNF at d/c, son lives with her and is not always available to assist pt     Prior Function Level of Independence: Independent with assistive device(s)         Comments: RW     Hand Dominance        Extremity/Trunk Assessment               Lower Extremity Assessment: RLE deficits/detail RLE Deficits / Details: maintained knee KI, pt aware she must keep knee extended (no flexion), pt still with LE numbness from nerve block, only able to feel foot       Communication   Communication: HOH  Cognition Arousal/Alertness: Awake/alert Behavior During Therapy: WFL for tasks assessed/performed Overall Cognitive Status: Within Functional Limits for tasks assessed  General Comments      Exercises        Assessment/Plan    PT Assessment Patient needs continued PT services  PT Diagnosis Difficulty walking;Acute pain   PT Problem List Decreased strength;Decreased mobility;Decreased knowledge of precautions;Decreased knowledge of use of DME;Pain  PT Treatment Interventions DME instruction;Gait training;Functional mobility training;Patient/family education;Therapeutic activities;Therapeutic exercise   PT Goals (Current goals can be found in the Care Plan section) Acute Rehab PT Goals PT Goal Formulation: With patient Time For Goal Achievement: 07/03/15 Potential to Achieve  Goals: Good    Frequency Min 6X/week   Barriers to discharge        Co-evaluation               End of Session Equipment Utilized During Treatment: Gait belt;Right knee immobilizer Activity Tolerance: Patient tolerated treatment well Patient left: in chair;with call bell/phone within reach;with chair alarm set           Time: 1610-9604 PT Time Calculation (min) (ACUTE ONLY): 13 min   Charges:   PT Evaluation $PT Eval Low Complexity: 1 Procedure     PT G Codes:        Colon Rueth,KATHrine E 06/26/2015, 1:03 PM Zenovia Jarred, PT, DPT 06/26/2015 Pager: [phone]

## 2015-06-26 NOTE — Op Note (Signed)
NAMELAKELEE, DEVERAUX NO.:  [id number]  MEDICAL RECORD NO.:  [id number]  LOCATION:  1611                         FACILITY:  [facility]  PHYSICIAN:  Madlyn Frankel. Charlann Boxer, M.D.  DATE OF BIRTH:  10/20/1932  DATE OF PROCEDURE:  06/25/2015 DATE OF DISCHARGE:                              OPERATIVE REPORT   PREOPERATIVE DIAGNOSES:  Repeat traumatic avulsion of the right patellar tendon following tendon avulsion repair, following total knee arthroplasty.  POSTOPERATIVE DIAGNOSES:  Repeat traumatic avulsion of the right patellar tendon following tendon avulsion repair, following total knee arthroplasty.  PROCEDURE:  Revision repair of an avulsed patellar tendon using #2 FiberWire sutures and then 2 screw as anchors to the tibial tubercle as well as an anchor in the proximal tibia for the sutures.  SURGEON:  Madlyn Frankel. Charlann Boxer, M.D.  ASSISTANT:  Lanney Gins, PA-C.  Note that Mr. Carmon Sails was present for the entirety of the case from preoperative position, perioperative management of the operative extremity, general facilitation of the case and primary wound closure.  ANESTHESIA:  General.  SPECIMENS:  None.  COMPLICATIONS:  None.  DRAINS:  None.  TOURNIQUET TIME:  60 minutes at 250 mmHg.  BLOOD LOSS:  Minimal.  We injected tranexamic acid into the joint.  INDICATIONS FOR PROCEDURE:  Ms. Ark is an 82 year old female with a history of right total knee arthroplasty.  In the postoperative period, she was noted to have a traumatic avulsion of the patellar tendon off the tibial tubercle, had subsequently taken to the operating room, and repaired this using suture as well as screws anchored onto the tibial tubercle.  Unfortunately perhaps related to an early incidence of hyperflexion of the knee or related to non-compliance issues, the patellar tendon was noted to have pulled way from the tibial tubercle resulting in a  significant extensor lag.  I had a lengthy  discussion with Ms. Kluk in particular son regarding the inability to function normally with the laxity in extensor mechanism, and that I wished to try to get it repaired again, so she could have more functioning, improved quad strength, so she would have function in balance.  Risks of recurrence of this injury discussed and this is being a final option to try to get repair.  Risks of infection, DVT, discussed as well.  Consent was Obtained for benefit of patella tendon repair to the tibial tubercle.  PROCEDURE IN DETAIL:  The patient was brought to operative theater. Once adequate anesthesia, preoperative antibiotics, Ancef administered, she was positioned supine with a right thigh tourniquet placed with the set up for [facility] leg holder.  The right lower extremity was prepped and draped in sterile fashion.  A time-out was performed identifying the patient, planned procedure, and extremity.  Leg was exsanguinated.  Tourniquet elevated to 250 mmHg.  The old incision was excised, soft tissue planes created.  At this point, I created an arthrotomy both medially and laterally to isolate the patellar tendon.  Old sutures removed as identified.  The 2 old screws were identified and initially removed off the tibial tubercle, but kept in place in the proximal tibia as an anchor for the sutures.  Following evaluating the extent  of the patellar tendon, I then made a transverse incision in the healed scar dissection of the patellar tendon, identified what I felt was normal tendon.  I then passed #2 FiberWire sutures into the patellar tendon in a Krackow weave pattern with 4 strands coming out distally.  We then were able to mobilize the patella to get it to the position of its anatomic position and then wrapped the sutures around the distal anchor holding this in place, we then tied this down.  I then temporized this using guidewires in the tibial tubercle.  Once this was in the stable  position, I then reapplied this over a guidewire.  The cannulated 4-0 cancellous screw with a spider type washer over the tibial tendon onto the tibial tubercle.  Once this was done, I then over sewed the remaining collagen scar tissue over top of this area reapproximating the medial and lateral retinacular repair incisions using #1 Vicryl.  Once this was done, we injected 2 g of tranexamic acid into the joint.  We then reapproximated subcutaneous tissue with 2-0 Vicryl and running 3-0 Monocryl on the wound.  The wound was cleaned, dried, and dressed sterilely with surgical glue and Aquacel dressing.  She was then brought to the recovery room in a knee immobilizer.  POSTOPERATIVE PLAN:  She will be weightbearing as tolerated.  I am going to keep her leg straight for 4-6 weeks.  We have discussed in the office about placing her in a cast for compliance issues and may before discharge place her into a posterior splint at least as opposed to cast, so I can get her wound check when she comes to the office in 2 weeks. At that time, she will be transferred into a long-leg cast, padded at the ankle.  We will keep her in a cast for probably from that point 2-4 more weeks to assure compliance before transitioning her to physical therapy.  My hope with this in intentional maintenance of extension that this will facilitate healing of the tendon tissue to the bone to allow for her to have more freedom with activity and acknowledged that by doing this, may sacrifice some of her knee flexion, however, for me her knee extension will be most important for balancing, quadriceps strength for function purposes. Findings were reviewed with her son postoperatively.     Madlyn Frankel Charlann Boxer, M.D.     MDO/MEDQ  D:  06/25/2015  T:  06/25/2015  Job:  [id number]

## 2015-06-26 NOTE — Clinical Social Work Note (Signed)
Clinical Social Work Assessment  Patient Details  Name: [name] MRN: [id number] Date of Birth: Jul 12, 1932  Date of referral:  06/26/15               Reason for consult:  Discharge Planning                Permission sought to share information with:  Chartered certified accountant granted to share information::  Yes, Verbal Permission Granted  Name::        Agency::     Relationship::     Contact Information:     Housing/Transportation Living arrangements for the past 2 months:  Single Family Home Source of Information:  Patient, Adult Children Patient Interpreter Needed:  None Criminal Activity/Legal Involvement Pertinent to Current Situation/Hospitalization:  No - Comment as needed Significant Relationships:  Adult Children Lives with:  Adult Children Do you feel safe going back to the place where you live?   ([facility]) Need for family participation in patient care:  Yes (Comment)  Care giving concerns: Pt's care cannot be managed at home following hospital d/c.   Social Worker assessment / plan:  Pt hospitalized on 06/27/15 for pre planned revision / repair of right patella tendon. CSW met with pt and spoke with pt's son, Shanon Brow, [phone], to assist with d/c planning. Pt / family reports that [facility] is needed at d/c and [facility] has been requested. Clinicals have been sent to [facility] and SNF has made a bed offer. CSW will continue to follow to assist with d/c planning to [facility].  Employment status:  Retired Nurse, adult PT Recommendations:  [place] / Referral to community resources:  [place]  Patient/Family's Response to care: Pt/family feel ST Rehab is needed.  Patient/Family's Understanding of and Emotional Response to Diagnosis, Current Treatment, and Prognosis: Pt / family are aware of pt's medical status. Pt / son are pleased that [facility] able to offer [facility]. Pt has been there in the past and had a good experience.   Emotional Assessment Appearance:  Appears stated age Attitude/Demeanor/Rapport:  Other (cooperative) Affect (typically observed):  Appropriate, Pleasant Orientation:  Oriented to Self, Oriented to Place, Oriented to  Time, Oriented to Situation Alcohol / Substance use:  Not Applicable Psych involvement (Current and /or in the community):  No (Comment)  Discharge Needs  Concerns to be addressed:  Discharge Planning Concerns Readmission within the last 30 days:  No Current discharge risk:  None Barriers to Discharge:  No Barriers Identified   [name]  [phone] 06/26/2015, 12:54 PM

## 2015-06-26 NOTE — NC FL2 (Signed)
[place] MEDICAID FL2 LEVEL OF CARE SCREENING TOOL     IDENTIFICATION  Patient Name: MICHOL CAN Birthdate: 1933-02-04 Sex: female Admission Date (Current Location): 06/25/2015  [facility] and [place] Number:  Producer, television/film/video and Address:  [facility],  [address] [place] [address]      Provider Number: [id number]  Attending Physician Name and Address:  Durene Romans, MD  Relative Name and Phone Number:       Current Level of Care: Hospital Recommended Level of Care: Skilled Nursing Facility Prior Approval Number:    Date Approved/Denied:   PASRR Number:    Discharge Plan: SNF    Current Diagnoses: Patient Active Problem List   Diagnosis Date Noted  . Patellar tendon rupture 06/25/2015  . Avulsion of right patellar tendon 03/08/2015  . Aspiration pneumonia (HCC) 12/14/2014  . Anemia 12/14/2014  . Aspiration into airway 12/14/2014  . Swallowing dysfunction 12/14/2014  . S/P right TKA 12/04/2014  . S/P knee replacement 12/04/2014  . Acute blood loss anemia 12/28/2012  . Thrombocytopenia (HCC) 12/28/2012  . Toe fracture 12/28/2012  . CKD (chronic kidney disease), stage III 12/28/2012  . Low back pain 12/28/2012  . Wound dehiscence, surgical 12/28/2012  . Atelectasis 12/28/2012  . S/P TAVR (transcatheter aortic valve replacement) 12/21/2012  . Severe aortic stenosis 12/06/2012  . Aortic stenosis 10/13/2012  . Acute on chronic systolic and diastolic heart failure, NYHA class 3 (HCC) 10/13/2012  . Pacemaker 10/15/2010  . Paroxysmal atrial fibrillation (HCC) 10/15/2010  . Dyslipidemia 10/15/2010  . Hypertension 10/15/2010  . Chronic diastolic heart failure (HCC) 10/15/2010    Orientation RESPIRATION BLADDER Height & Weight     Self, Time, Situation, Place  Normal Continent Weight: 81.647 kg (180 lb) Height:  4\' 10"  (147.3 cm)  BEHAVIORAL SYMPTOMS/MOOD NEUROLOGICAL BOWEL NUTRITION STATUS  Other (Comment) (no behaviors)   Continent Diet   AMBULATORY STATUS COMMUNICATION OF NEEDS Skin   Extensive Assist Verbally Surgical wounds                       Personal Care Assistance Level of Assistance  Bathing, Feeding, Dressing Bathing Assistance: Maximum assistance Feeding assistance: Independent Dressing Assistance: Maximum assistance     Functional Limitations Info  Sight, Hearing, Speech Sight Info: Adequate Hearing Info: Impaired Speech Info: Adequate    SPECIAL CARE FACTORS FREQUENCY  PT (By licensed PT), OT (By licensed OT)     PT Frequency: 5 x wk OT Frequency: 5 x wk            Contractures Contractures Info: Not present    Additional Factors Info  Code Status Code Status Info: Full Code             Current Medications (06/26/2015):  This is the current hospital active medication list Current Facility-Administered Medications  Medication Dose Route Frequency Provider Last Rate Last Dose  . alum & mag hydroxide-simeth (MAALOX/MYLANTA) 200-200-20 MG/5ML suspension 30 mL  30 mL Oral Q4H PRN Lanney Gins, PA-C      . amiodarone (PACERONE) tablet 50 mg  50 mg Oral q morning - 10a Lanney Gins, PA-C   50 mg at 06/26/15 1002  . bisacodyl (DULCOLAX) suppository 10 mg  10 mg Rectal Daily PRN Lanney Gins, PA-C      . calcitRIOL (ROCALTROL) capsule 0.25 mcg  0.25 mcg Oral q morning - 10a Lanney Gins, PA-C   0.25 mcg at 06/26/15 1002  . carvedilol (COREG) tablet 12.5  mg  12.5 mg Oral BID Lanney Gins, PA-C   12.5 mg at 06/26/15 1003  . celecoxib (CELEBREX) capsule 200 mg  200 mg Oral Q12H Lanney Gins, PA-C   200 mg at 06/26/15 1003  . colchicine tablet 0.3 mg  0.3 mg Oral Daily PRN Lanney Gins, PA-C      . dexamethasone (DECADRON) injection 10 mg  10 mg Intravenous Once Lanney Gins, PA-C   10 mg at 06/26/15 1445  . diphenhydrAMINE (BENADRYL) capsule 25 mg  25 mg Oral Q6H PRN Lanney Gins, PA-C   25 mg at 06/25/15 2149  . docusate sodium (COLACE) capsule 100 mg  100 mg Oral BID  Lanney Gins, PA-C   100 mg at 06/26/15 1002  . donepezil (ARICEPT) tablet 10 mg  10 mg Oral QHS Lanney Gins, PA-C   10 mg at 06/25/15 2148  . DULoxetine (CYMBALTA) DR capsule 60 mg  60 mg Oral QPM Lanney Gins, PA-C   60 mg at 06/25/15 2149  . febuxostat (ULORIC) tablet 40 mg  40 mg Oral QPM Lanney Gins, PA-C   40 mg at 06/25/15 2149  . ferrous sulfate tablet 325 mg  325 mg Oral TID PC Lanney Gins, PA-C   325 mg at 06/26/15 1002  . hydrALAZINE (APRESOLINE) tablet 10 mg  10 mg Oral TID Lanney Gins, PA-C   10 mg at 06/25/15 2149  . HYDROcodone-acetaminophen (NORCO) 7.5-325 MG per tablet 1-2 tablet  1-2 tablet Oral Q4H Lanney Gins, PA-C   2 tablet at 06/26/15 [phone]  . HYDROmorphone (DILAUDID) injection 0.5-1 mg  0.5-1 mg Intravenous Q2H PRN Lanney Gins, PA-C   1 mg at 06/26/15 0114  . levothyroxine (SYNTHROID, LEVOTHROID) tablet 112 mcg  112 mcg Oral QAC breakfast Lanney Gins, PA-C   112 mcg at 06/26/15 8469  . Linaclotide (LINZESS) capsule 145 mcg  145 mcg Oral Isaac Laud, PA-C   145 mcg at 06/26/15 1002  . [START ON 06/27/2015] Linaclotide (LINZESS) capsule 290 mcg  290 mcg Oral QODAY Matthew Babish, PA-C      . loratadine (CLARITIN) tablet 10 mg  10 mg Oral Daily Lanney Gins, PA-C   10 mg at 06/26/15 1002  . magnesium citrate solution 1 Bottle  1 Bottle Oral Once PRN Lanney Gins, PA-C      . memantine [facility]) tablet 10 mg  10 mg Oral BID Lanney Gins, PA-C   10 mg at 06/26/15 1002  . menthol-cetylpyridinium (CEPACOL) lozenge 3 mg  1 lozenge Oral PRN Lanney Gins, PA-C       Or  . phenol (CHLORASEPTIC) mouth spray 1 spray  1 spray Mouth/Throat PRN Lanney Gins, PA-C      . methocarbamol (ROBAXIN) tablet 500 mg  500 mg Oral Q6H PRN Durene Romans, MD      . metoCLOPramide (REGLAN) tablet 5-10 mg  5-10 mg Oral Q8H PRN Lanney Gins, PA-C       Or  . metoCLOPramide (REGLAN) injection 5-10 mg  5-10 mg Intravenous Q8H PRN Lanney Gins, PA-C      .  nitroGLYCERIN (NITROSTAT) SL tablet 0.4 mg  0.4 mg Sublingual Q5 min PRN Lanney Gins, PA-C      . omeprazole (PRILOSEC) capsule 20 mg  20 mg Oral QAC breakfast Durene Romans, MD   20 mg at 06/26/15 6295  . ondansetron (ZOFRAN) tablet 4 mg  4 mg Oral Q6H PRN Lanney Gins, PA-C   4 mg at 06/26/15 0738   Or  . ondansetron [facility]) injection  4 mg  4 mg Intravenous Q6H PRN Lanney Gins, PA-C      . polyethylene glycol (MIRALAX / GLYCOLAX) packet 17 g  17 g Oral BID Lanney Gins, PA-C   17 g at 06/26/15 1002  . pravastatin (PRAVACHOL) tablet 40 mg  40 mg Oral QHS Lanney Gins, PA-C   40 mg at 06/25/15 2148  . pregabalin (LYRICA) capsule 75 mg  75 mg Oral BID Lanney Gins, PA-C   75 mg at 06/26/15 1003  . sodium chloride 0.9 % 1,000 mL infusion   Intravenous Continuous Lanney Gins, PA-C   Stopped at 06/26/15 1100  . torsemide (DEMADEX) tablet 20 mg  20 mg Oral BID Lanney Gins, PA-C   20 mg at 06/26/15 1610  . Warfarin - Pharmacist Dosing Inpatient   Does not apply q1800 Phylliss Blakes, [facility]       Facility-Administered Medications Ordered in Other Encounters  Medication Dose Route Frequency Provider Last Rate Last Dose  . sodium bicarbonate first hour bolus via infusion   Intravenous Once Pearson Grippe, MD         Discharge Medications: Please see discharge summary for a list of discharge medications.  Relevant Imaging Results:  Relevant Lab Results:   Additional Information ss # [id number]  Bilbo Carcamo, Dickey Gave, LCSW

## 2015-06-26 NOTE — NC FL2 (Deleted)
[place] MEDICAID FL2 LEVEL OF CARE SCREENING TOOL     IDENTIFICATION  Patient Name: [name] Birthdate: 13-Jan-1933 Sex: female Admission Date (Current Location): 06/25/2015  [facility] and [place] Number:  Producer, television/film/video and Address:  [facility],  [address], [place] [address]      Provider Number: [id number]  Attending Physician Name and Address:  Durene Romans, MD  Relative Name and Phone Number:       Current Level of Care: Hospital Recommended Level of Care: Skilled Nursing Facility Prior Approval Number:    Date Approved/Denied:   PASRR Number: [id number]  Discharge Plan: SNF    Current Diagnoses: Patient Active Problem List   Diagnosis Date Noted  . Patellar tendon rupture 06/25/2015  . Avulsion of right patellar tendon 03/08/2015  . Aspiration pneumonia (HCC) 12/14/2014  . Anemia 12/14/2014  . Aspiration into airway 12/14/2014  . Swallowing dysfunction 12/14/2014  . S/P right TKA 12/04/2014  . S/P knee replacement 12/04/2014  . Acute blood loss anemia 12/28/2012  . Thrombocytopenia (HCC) 12/28/2012  . Toe fracture 12/28/2012  . CKD (chronic kidney disease), stage III 12/28/2012  . Low back pain 12/28/2012  . Wound dehiscence, surgical 12/28/2012  . Atelectasis 12/28/2012  . S/P TAVR (transcatheter aortic valve replacement) 12/21/2012  . Severe aortic stenosis 12/06/2012  . Aortic stenosis 10/13/2012  . Acute on chronic systolic and diastolic heart failure, NYHA class 3 (HCC) 10/13/2012  . Pacemaker 10/15/2010  . Paroxysmal atrial fibrillation (HCC) 10/15/2010  . Dyslipidemia 10/15/2010  . Hypertension 10/15/2010  . Chronic diastolic heart failure (HCC) 10/15/2010    Orientation RESPIRATION BLADDER Height & Weight     Self, Time, Situation, Place  Normal Continent Weight: 81.647 kg (180 lb) Height:   (147.3 cm)  BEHAVIORAL SYMPTOMS/MOOD NEUROLOGICAL BOWEL NUTRITION STATUS  Other (Comment) (no behaviors)    Continent Diet  AMBULATORY STATUS COMMUNICATION OF NEEDS Skin   Extensive Assist Verbally Surgical wounds                       Personal Care Assistance Level of Assistance  Bathing, Feeding, Dressing Bathing Assistance: Maximum assistance Feeding assistance: Independent Dressing Assistance: Maximum assistance     Functional Limitations Info  Sight, Hearing, Speech Sight Info: Adequate Hearing Info: Impaired Speech Info: Adequate    SPECIAL CARE FACTORS FREQUENCY  PT (By licensed PT), OT (By licensed OT)     PT Frequency: 5 x wk OT Frequency: 5 x wk            Contractures Contractures Info: Not present    Additional Factors Info  Code Status Code Status Info: Full Code             Current Medications (06/26/2015):  This is the current hospital active medication list Current Facility-Administered Medications  Medication Dose Route Frequency Provider Last Rate Last Dose  . alum & mag hydroxide-simeth (MAALOX/MYLANTA) 200-200-20 MG/5ML suspension 30 mL  30 mL Oral Q4H PRN Lanney Gins, PA-C      . amiodarone (PACERONE) tablet 50 mg  50 mg Oral q morning - 10a Lanney Gins, PA-C   50 mg at 06/26/15 1002  . bisacodyl (DULCOLAX) suppository 10 mg  10 mg Rectal Daily PRN Lanney Gins, PA-C      . calcitRIOL (ROCALTROL) capsule 0.25 mcg  0.25 mcg Oral q morning - 10a Lanney Gins, PA-C   0.25 mcg at 06/26/15 1002  . carvedilol (COREG) tablet 12.5 mg  12.5 mg Oral BID Lanney Gins, PA-C   12.5 mg at 06/26/15 1003  . celecoxib (CELEBREX) capsule 200 mg  200 mg Oral Q12H Lanney Gins, PA-C   200 mg at 06/26/15 1003  . colchicine tablet 0.3 mg  0.3 mg Oral Daily PRN Lanney Gins, PA-C      . dexamethasone (DECADRON) injection 10 mg  10 mg Intravenous Once Lanney Gins, PA-C   10 mg at 06/26/15 1445  . diphenhydrAMINE (BENADRYL) capsule 25 mg  25 mg Oral Q6H PRN Lanney Gins, PA-C   25 mg at 06/25/15 2149  . docusate sodium (COLACE) capsule 100 mg  100 mg  Oral BID Lanney Gins, PA-C   100 mg at 06/26/15 1002  . donepezil (ARICEPT) tablet 10 mg  10 mg Oral QHS Lanney Gins, PA-C   10 mg at 06/25/15 2148  . DULoxetine (CYMBALTA) DR capsule 60 mg  60 mg Oral QPM Lanney Gins, PA-C   60 mg at 06/25/15 2149  . febuxostat (ULORIC) tablet 40 mg  40 mg Oral QPM Lanney Gins, PA-C   40 mg at 06/25/15 2149  . ferrous sulfate tablet 325 mg  325 mg Oral TID PC Lanney Gins, PA-C   325 mg at 06/26/15 1002  . hydrALAZINE (APRESOLINE) tablet 10 mg  10 mg Oral TID Lanney Gins, PA-C   10 mg at 06/25/15 2149  . HYDROcodone-acetaminophen (NORCO) 7.5-325 MG per tablet 1-2 tablet  1-2 tablet Oral Q4H Lanney Gins, PA-C   2 tablet at 06/26/15 [phone]  . HYDROmorphone (DILAUDID) injection 0.5-1 mg  0.5-1 mg Intravenous Q2H PRN Lanney Gins, PA-C   1 mg at 06/26/15 0114  . levothyroxine (SYNTHROID, LEVOTHROID) tablet 112 mcg  112 mcg Oral QAC breakfast Lanney Gins, PA-C   112 mcg at 06/26/15 9604  . Linaclotide (LINZESS) capsule 145 mcg  145 mcg Oral Isaac Laud, PA-C   145 mcg at 06/26/15 1002  . [START ON 06/27/2015] Linaclotide (LINZESS) capsule 290 mcg  290 mcg Oral QODAY Matthew Babish, PA-C      . loratadine (CLARITIN) tablet 10 mg  10 mg Oral Daily Lanney Gins, PA-C   10 mg at 06/26/15 1002  . magnesium citrate solution 1 Bottle  1 Bottle Oral Once PRN Lanney Gins, PA-C      . memantine [facility]) tablet 10 mg  10 mg Oral BID Lanney Gins, PA-C   10 mg at 06/26/15 1002  . menthol-cetylpyridinium (CEPACOL) lozenge 3 mg  1 lozenge Oral PRN Lanney Gins, PA-C       Or  . phenol (CHLORASEPTIC) mouth spray 1 spray  1 spray Mouth/Throat PRN Lanney Gins, PA-C      . methocarbamol (ROBAXIN) tablet 500 mg  500 mg Oral Q6H PRN Durene Romans, MD      . metoCLOPramide (REGLAN) tablet 5-10 mg  5-10 mg Oral Q8H PRN Lanney Gins, PA-C       Or  . metoCLOPramide (REGLAN) injection 5-10 mg  5-10 mg Intravenous Q8H PRN Lanney Gins, PA-C       . nitroGLYCERIN (NITROSTAT) SL tablet 0.4 mg  0.4 mg Sublingual Q5 min PRN Lanney Gins, PA-C      . omeprazole (PRILOSEC) capsule 20 mg  20 mg Oral QAC breakfast Durene Romans, MD   20 mg at 06/26/15 5409  . ondansetron (ZOFRAN) tablet 4 mg  4 mg Oral Q6H PRN Lanney Gins, PA-C   4 mg at 06/26/15 0738   Or  . ondansetron (ZOFRAN) injection 4 mg  4 mg Intravenous Q6H PRN Lanney Gins, PA-C      . polyethylene glycol (MIRALAX / GLYCOLAX) packet 17 g  17 g Oral BID Lanney Gins, PA-C   17 g at 06/26/15 1002  . pravastatin (PRAVACHOL) tablet 40 mg  40 mg Oral QHS Lanney Gins, PA-C   40 mg at 06/25/15 2148  . pregabalin (LYRICA) capsule 75 mg  75 mg Oral BID Lanney Gins, PA-C   75 mg at 06/26/15 1003  . sodium chloride 0.9 % 1,000 mL infusion   Intravenous Continuous Lanney Gins, PA-C   Stopped at 06/26/15 1100  . torsemide (DEMADEX) tablet 20 mg  20 mg Oral BID Lanney Gins, PA-C   20 mg at 06/26/15 3154  . Warfarin - Pharmacist Dosing Inpatient   Does not apply q1800 Phylliss Blakes, [facility]       Facility-Administered Medications Ordered in Other Encounters  Medication Dose Route Frequency Provider Last Rate Last Dose  . sodium bicarbonate first hour bolus via infusion   Intravenous Once Pearson Grippe, MD         Discharge Medications: Please see discharge summary for a list of discharge medications.  Relevant Imaging Results:  Relevant Lab Results:   Additional Information ss # [id number]  Sherod Cisse, Dickey Gave, LCSW

## 2015-06-26 NOTE — Evaluation (Signed)
Occupational Therapy Evaluation Patient Details Name: [name] MRN: [id number] DOB: 02-26-1933 Today's Date: 06/26/2015    History of Present Illness Pt is an 82 year old female s/p right patella tendon revision/repair and PMHx of CVA, HTN, R TKA, previous R patellar tendon surgery   Clinical Impression   Pt was admitted for the above. She will benefit from skilled OT to increase safety and independence with ADLs.  Goals in acute are for min A for toilet transfers/toileting and hygiene. She will benefit from continued OT at SNF to further regain independence    Follow Up Recommendations  SNF    Equipment Recommendations  None recommended by OT    Recommendations for Other Services       Precautions / Restrictions Precautions Precautions: Knee;Fall Required Braces or Orthoses: Knee Immobilizer - Right Knee Immobilizer - Right: On at all times Restrictions RLE Weight Bearing: Weight bearing as tolerated      Mobility Bed Mobility Overal bed mobility: Needs Assistance Bed Mobility: Supine to Sit          General bed mobility comments: oob  Transfers Overall transfer level: Needs assistance Equipment used: Rolling walker (2 wheeled) Transfers: Sit to/from Stand Sit to Stand: Mod assist         General transfer comment: cues for UE/LE placement    Balance                                            ADL Overall ADL's : Needs assistance/impaired     Grooming: Set up;Sitting   Upper Body Bathing: Set up;Sitting   Lower Body Bathing: Moderate assistance;Sit to/from stand   Upper Body Dressing : Sitting;Minimal assistance   Lower Body Dressing: Maximal assistance;Sit to/from stand                 General ADL Comments: only performed sit to stand this session.  Pt has catheter in place     Vision     Perception     Praxis      Pertinent Vitals/Pain Pain Assessment: Faces Faces Pain Scale: Hurts little more Pain  Location: RLE and shoulders, bil at end range during assessment Pain Descriptors / Indicators: Aching;Sore Pain Intervention(s): Limited activity within patient's tolerance;Monitored during session;Premedicated before session;Repositioned;Ice applied     Hand Dominance     Extremity/Trunk Assessment Upper Extremity Assessment Upper Extremity Assessment: Generalized weakness (bil shoulders painful at end ranges)          Communication Communication Communication: HOH (VERY)   Cognition Arousal/Alertness: Awake/alert Behavior During Therapy: WFL for tasks assessed/performed Overall Cognitive Status: Within Functional Limits for tasks assessed                     General Comments       Exercises       Shoulder Instructions      Home Living Family/patient expects to be discharged to:: Skilled nursing facility                             Home Equipment: Dan Humphreys - 2 wheels;Walker - 4 wheels;Bedside commode;Wheelchair - manual   Additional Comments: Pt with plans to go to SNF at d/c, son lives with her and is not always available to assist pt       Prior Functioning/Environment Level  of Independence: Independent with assistive device(s)        Comments: RW    OT Diagnosis: Generalized weakness   OT Problem List: Decreased strength;Decreased activity tolerance;Decreased knowledge of use of DME or AE;Pain   OT Treatment/Interventions: Self-care/ADL training;DME and/or AE instruction;Patient/family education    OT Goals(Current goals can be found in the care plan section) Acute Rehab OT Goals Patient Stated Goal: get knee healed and not have any more trouble OT Goal Formulation: With patient Time For Goal Achievement: 07/03/15 Potential to Achieve Goals: Good ADL Goals Pt Will Perform Lower Body Bathing: with min assist;with adaptive equipment;sit to/from stand Pt Will Transfer to Toilet: with min assist;stand pivot transfer;bedside commode Pt  Will Perform Toileting - Clothing Manipulation and hygiene: with min assist;sit to/from stand  OT Frequency: Min 2X/week   Barriers to D/C:            Co-evaluation              End of Session    Activity Tolerance: Patient tolerated treatment well Patient left: in chair;with call bell/phone within reach;with chair alarm set   Time: 1610-9604 OT Time Calculation (min): 25 min Charges:  OT General Charges $OT Visit: 1 Procedure OT Evaluation $OT Eval Low Complexity: 1 Procedure G-Codes:    Johnae Friley 2015-07-25, 3:02 PM Marica Otter, OTR/L [phone] 2015/07/25

## 2015-06-27 LAB — BASIC METABOLIC PANEL
ANION GAP: 9 (ref 5–15)
BUN: 61 mg/dL — ABNORMAL HIGH (ref 6–20)
CALCIUM: 8.8 mg/dL — AB (ref 8.9–10.3)
CO2: 28 mmol/L (ref 22–32)
Chloride: 105 mmol/L (ref 101–111)
Creatinine, Ser: 3.33 mg/dL — ABNORMAL HIGH (ref 0.44–1.00)
GFR calc non Af Amer: 12 mL/min — ABNORMAL LOW (ref >60)
GFR, EST AFRICAN AMERICAN: 14 mL/min — AB (ref >60)
GLUCOSE: 104 mg/dL — AB (ref 65–99)
POTASSIUM: 4.3 mmol/L (ref 3.5–5.1)
Sodium: 142 mmol/L (ref 135–145)

## 2015-06-27 LAB — CBC
HEMATOCRIT: 30.2 % — AB (ref 36.0–46.0)
HEMOGLOBIN: 9.4 g/dL — AB (ref 12.0–15.0)
MCH: 29.1 pg (ref 26.0–34.0)
MCHC: 31.1 g/dL (ref 30.0–36.0)
MCV: 93.5 fL (ref 78.0–100.0)
Platelets: 209 10*3/uL (ref 150–400)
RBC: 3.23 MIL/uL — AB (ref 3.87–5.11)
RDW: 15.9 % — AB (ref 11.5–15.5)
WBC: 7.1 10*3/uL (ref 4.0–10.5)

## 2015-06-27 LAB — PROTIME-INR
INR: 3 — AB (ref 0.00–1.49)
Prothrombin Time: 30.6 seconds — ABNORMAL HIGH (ref 11.6–15.2)

## 2015-06-27 MED ORDER — TIZANIDINE HCL 4 MG PO TABS: 4.0000 mg | ORAL_TABLET | Freq: Four times a day (QID) | ORAL | Status: DC | PRN

## 2015-06-27 MED ORDER — HYDROCODONE-ACETAMINOPHEN 7.5-325 MG PO TABS: 1.0000 | ORAL_TABLET | ORAL | Status: DC | PRN

## 2015-06-27 MED ORDER — FUROSEMIDE 10 MG/ML IJ SOLN
10.0000 mg | Freq: Once | INTRAMUSCULAR | Status: DC
  Filled 2015-06-27: qty 1

## 2015-06-27 MED ORDER — FUROSEMIDE 10 MG/ML IJ SOLN
10.0000 mg | Freq: Once | INTRAMUSCULAR | Status: AC
  Administered 2015-06-27: 10 mg via INTRAVENOUS
  Filled 2015-06-27 (×2): qty 1

## 2015-06-27 MED ORDER — DOCUSATE SODIUM 100 MG PO CAPS: 100.0000 mg | ORAL_CAPSULE | Freq: Two times a day (BID) | ORAL | Status: DC

## 2015-06-27 NOTE — Progress Notes (Addendum)
ANTICOAGULATION CONSULT NOTE - Initial Consult  Pharmacy Consult for Coumadin Indication: atrial fibrillation, VTE prophylaxis  Allergies  Allergen Reactions  . Nubain [Nalbuphine Hcl] Hives    Went into cardiac arrest   . Codeine Hives and Nausea Only  . Darvocet [Propoxyphene N-Acetaminophen] Nausea Only    Patient Measurements: Height: 4\' 10"  (147.3 cm) Weight: 180 lb (81.647 kg) IBW/kg (Calculated) : 40.9  Vital Signs: Temp: 97.9 F (36.6 C) (03/08 0440) Temp Source: Oral (03/08 0440) BP: 118/46 mmHg (03/08 0938) Pulse Rate: 72 (03/08 0938)  Labs:  Recent Labs  06/25/15 1420  06/25/15 2112 06/26/15 0339 06/27/15 0403  HGB  --   < > 12.5 10.0* 9.4*  HCT  --   --  38.0 31.4* 30.2*  PLT  --   --  193 214 209  APTT 46*  --   --   --   --   LABPROT 27.4*  --   --  26.2* 30.6*  INR 2.58*  --   --  2.44* 3.00*  CREATININE  --   --  2.17* 2.44* 3.33*  < > = values in this interval not displayed.  Estimated Creatinine Clearance: 12 mL/min (by C-G formula based on Cr of 3.33).   Medications:  Warfarin PTA 2.5mg  daily.  LD reported as 2/28 (verified with son)  Assessment: 60 yoF on chronic Coumadin for Afib.  She was admitted on 3/6 for R patella tendon revision/repair.   She stopped Coumadin on 2/28 and transitioned to Lovenox, however INR was therapeutic (2.58) on admission (3/6) No Vitamin K or FFP documented as given pre-op.   No excessive bleeding reported.   Today, 06/27/2015: - INR now borderline supratherapeutic after no additional warfarin doses since 2/28.  No recent LFTs - hgb down (probably secondary to ABLA), plt stable - no bleeding documented - on amiodarone, but appears to be stable part of home regimen; no dose increases recently   Goal of Therapy:  INR 2-3 Monitor platelets by anticoagulation protocol: Yes   Plan:   Since INR remains therapeutic after 7 days off drug and concern for her clearing the drug adequately, will hold her warfarin dose  today  Daily INR; f/u with AM labs and resume if/when appropriate  Consider CMET to r/o any liver-related coagulopathies   Bernadene Person, PharmD, BCPS Pager: [phone] 06/27/2015, 10:12 AM

## 2015-06-27 NOTE — Addendum Note (Signed)
Addendum  created 06/27/15 1148 by Elyn Peers, CRNA   Modules edited: Charges VN

## 2015-06-27 NOTE — Clinical Documentation Improvement (Signed)
Orthopedic  Abnormal Lab/Test Results:  Bun 54 Creatinine 2.44  Possible Clinical Conditions associated with below indicators  Acute Renal Failure  CKD stage (I-IV)  Other Condition  Cannot Clinically Determine   Supporting Information: Revision of repair of patellar avulsion, ckd, hypertension, chf   Treatment Provided: NS @ 100 ml   Please exercise your independent, professional judgment when responding. A specific answer is not anticipated or expected.   Thank You,  Lavonda Jumbo [employer] [facility] [place] [phone]

## 2015-06-27 NOTE — Progress Notes (Signed)
Physical Therapy Treatment Patient Details Name: [name] MRN: [id number] DOB: 1932-05-18 Today's Date: 07/03/2015    History of Present Illness Pt is an 82 year old female s/p right patella tendon revision/repair and PMHx of CVA, HTN, R TKA, previous R patellar tendon surgery    PT Comments    Pt assisted with ambulating and fatigues quickly.  Pt to d/c to SNF today.  Follow Up Recommendations  SNF     Equipment Recommendations  None recommended by PT    Recommendations for Other Services       Precautions / Restrictions Precautions Precautions: Knee;Fall Required Braces or Orthoses: Knee Immobilizer - Right Knee Immobilizer - Right: On at all times Restrictions Weight Bearing Restrictions: No RLE Weight Bearing: Weight bearing as tolerated    Mobility  Bed Mobility Overal bed mobility: Needs Assistance Bed Mobility: Supine to Sit     Supine to sit: Min assist;HOB elevated     General bed mobility comments: assist for R LE  Transfers Overall transfer level: Needs assistance Equipment used: Rolling walker (2 wheeled) Transfers: Sit to/from Stand Sit to Stand: Min assist         General transfer comment: verbal cues for safe technique, assist to rise and steady  Ambulation/Gait Ambulation/Gait assistance: Min assist Ambulation Distance (Feet): 40 Feet Assistive device: Rolling walker (2 wheeled) Gait Pattern/deviations: Step-to pattern;Antalgic     General Gait Details: verbal cues for RW positioning, posture, step length, sequence, reports UE fatigue   Stairs            Wheelchair Mobility    Modified Rankin (Stroke Patients Only)       Balance                                    Cognition Arousal/Alertness: Awake/alert Behavior During Therapy: WFL for tasks assessed/performed Overall Cognitive Status: Within Functional Limits for tasks assessed                      Exercises      General Comments         Pertinent Vitals/Pain Pain Assessment: Faces Faces Pain Scale: Hurts a little bit Pain Location: R LE Pain Descriptors / Indicators: Aching;Sore Pain Intervention(s): Limited activity within patient's tolerance;Monitored during session;Repositioned    Home Living                      Prior Function            PT Goals (current goals can now be found in the care plan section) Progress towards PT goals: Progressing toward goals    Frequency  Min 6X/week    PT Plan Current plan remains appropriate    Co-evaluation             End of Session Equipment Utilized During Treatment: Gait belt;Right knee immobilizer Activity Tolerance: Patient tolerated treatment well Patient left: with call bell/phone within reach;in chair;with chair alarm set     Time: 1010-1027 PT Time Calculation (min) (ACUTE ONLY): 17 min  Charges:  $Gait Training: 8-22 mins                    G Codes:      Malkie Wille,KATHrine E July 03, 2015, 12:52 PM Zenovia Jarred, PT, DPT 07/03/15 Pager: [phone]

## 2015-06-27 NOTE — Progress Notes (Signed)
     Subjective: 2 Days Post-Op Procedure(s) (LRB): RIGHT PATELLA TENDON REVISION/REPAIR (Right)   Patient reports pain as mild, pain controlled. No events throughout the night.   Objective:   VITALS:   Filed Vitals:   06/26/15 2132 06/27/15 0440  BP: 125/42 181/63  Pulse: 69 66  Temp: 97.4 F (36.3 C) 97.9 F (36.6 C)  Resp: 18     Dorsiflexion/Plantar flexion intact Incision: dressing C/D/I No cellulitis present Compartment soft  LABS  Recent Labs  06/25/15 2112 06/26/15 0339 06/27/15 0403  HGB 12.5 10.0* 9.4*  HCT 38.0 31.4* 30.2*  WBC 4.4 5.9 7.1  PLT 193 214 209     Recent Labs  06/25/15 2112 06/26/15 0339 06/27/15 0403  NA  --  142 142  K  --  4.6 4.3  BUN  --  54* 61*  CREATININE 2.17* 2.44* 3.33*  GLUCOSE  --  142* 104*     Assessment/Plan: 2 Days Post-Op Procedure(s) (LRB): RIGHT PATELLA TENDON REVISION/REPAIR (Right) Up with therapy Discharge to SNF Knee Immobilzer on at ALL TIMES, no flexion of the right knee.   Acute on chronic renal failure Slight bump in BUN and creat post-operatively Is being treated by Dr. Cherlynn Kaiser. [name]   PAC  06/27/2015, 9:19 AM

## 2015-06-27 NOTE — Progress Notes (Signed)
Patient unable to void after foley discontinued 6 hours ago.  Bladder scan reveals 26ml with patient denying sensation to void.  Patient given additional time to void.  2 hours later bladder scan reveals 72ml with patient again denying sensation to void.  Peripheral IV infiltrated.  IV team consulted to start PIV.

## 2015-06-28 LAB — BASIC METABOLIC PANEL
ANION GAP: 8 (ref 5–15)
BUN: 76 mg/dL — AB (ref 6–20)
CALCIUM: 9 mg/dL (ref 8.9–10.3)
CO2: 27 mmol/L (ref 22–32)
Chloride: 106 mmol/L (ref 101–111)
Creatinine, Ser: 3.37 mg/dL — ABNORMAL HIGH (ref 0.44–1.00)
GFR calc Af Amer: 14 mL/min — ABNORMAL LOW (ref >60)
GFR, EST NON AFRICAN AMERICAN: 12 mL/min — AB (ref >60)
GLUCOSE: 106 mg/dL — AB (ref 65–99)
Potassium: 4.3 mmol/L (ref 3.5–5.1)
SODIUM: 141 mmol/L (ref 135–145)

## 2015-06-28 LAB — CBC
HCT: 31.6 % — ABNORMAL LOW (ref 36.0–46.0)
Hemoglobin: 10.1 g/dL — ABNORMAL LOW (ref 12.0–15.0)
MCH: 29.7 pg (ref 26.0–34.0)
MCHC: 32 g/dL (ref 30.0–36.0)
MCV: 92.9 fL (ref 78.0–100.0)
PLATELETS: 208 10*3/uL (ref 150–400)
RBC: 3.4 MIL/uL — ABNORMAL LOW (ref 3.87–5.11)
RDW: 15.9 % — AB (ref 11.5–15.5)
WBC: 7.2 10*3/uL (ref 4.0–10.5)

## 2015-06-28 LAB — PROTIME-INR
INR: 2.87 — ABNORMAL HIGH (ref 0.00–1.49)
PROTHROMBIN TIME: 28.7 s — AB (ref 11.6–15.2)

## 2015-06-28 MED ORDER — WARFARIN 0.5 MG HALF TABLET
0.5000 mg | ORAL_TABLET | Freq: Once | ORAL | Status: AC
  Administered 2015-06-28: 0.5 mg via ORAL
  Filled 2015-06-28: qty 1

## 2015-06-28 NOTE — Care Management Note (Signed)
Case Management Note  Patient Details  Name: [name] MRN: [id number] Date of Birth: 04/17/1933  Subjective/Objective:                  RIGHT PATELLA TENDON REVISION/REPAIR (Right)  Action/Plan: Discharge planning Expected Discharge Date:  06/28/15               Expected Discharge Plan:  Skilled Nursing Facility  In-House Referral:     Discharge planning Services  CM Consult  Post Acute Care Choice:    Choice offered to:  Patient  DME Arranged:    DME Agency:     HH Arranged:    HH Agency:     Status of Service:  Completed, signed off  Medicare Important Message Given:    Date Medicare IM Given:    Medicare IM give by:    Date Additional Medicare IM Given:    Additional Medicare Important Message give by:     If discussed at Long Length of Stay Meetings, dates discussed:    Additional Comments: CM notes pt to go to SNF for rehab; CSW arranging.  No other CM needs were communicated. Yves Dill, RN 06/28/2015, 12:24 PM

## 2015-06-28 NOTE — Progress Notes (Signed)
Physical Therapy Treatment Patient Details Name: [name] MRN: [id number] DOB: 10-19-1932 Today's Date: 19-Jul-2015    History of Present Illness Pt is an 82 year old female s/p right patella tendon revision/repair and PMHx of CVA, HTN, R TKA, previous R patellar tendon surgery    PT Comments    Pt assisted with ambulating in hallway and then to recliner.  Pt slowly improving with mobility.  Follow Up Recommendations  SNF     Equipment Recommendations  None recommended by PT    Recommendations for Other Services       Precautions / Restrictions Precautions Precautions: Knee;Fall Required Braces or Orthoses: Knee Immobilizer - Right Knee Immobilizer - Right: On at all times Restrictions RLE Weight Bearing: Weight bearing as tolerated    Mobility  Bed Mobility Overal bed mobility: Needs Assistance Bed Mobility: Supine to Sit     Supine to sit: HOB elevated;Min guard     General bed mobility comments: verbal cues for self assist  Transfers Overall transfer level: Needs assistance Equipment used: Rolling walker (2 wheeled) Transfers: Sit to/from Stand Sit to Stand: Min assist         General transfer comment: verbal cues for safe technique, assist to steady  Ambulation/Gait Ambulation/Gait assistance: Min guard Ambulation Distance (Feet): 90 Feet Assistive device: Rolling walker (2 wheeled) Gait Pattern/deviations: Step-to pattern;Trunk flexed;Antalgic     General Gait Details: verbal cues for RW positioning, posture, step length, sequence, reports UE fatigue requiring a few standing rest breaks   Stairs            Wheelchair Mobility    Modified Rankin (Stroke Patients Only)       Balance                                    Cognition Arousal/Alertness: Awake/alert Behavior During Therapy: WFL for tasks assessed/performed Overall Cognitive Status: Within Functional Limits for tasks assessed                       Exercises      General Comments        Pertinent Vitals/Pain Pain Assessment: Faces Faces Pain Scale: Hurts a little bit Pain Location: R LE Pain Descriptors / Indicators: Aching;Sore Pain Intervention(s): Limited activity within patient's tolerance;Monitored during session;Repositioned    Home Living                      Prior Function            PT Goals (current goals can now be found in the care plan section) Progress towards PT goals: Progressing toward goals    Frequency  Min 6X/week    PT Plan Current plan remains appropriate    Co-evaluation             End of Session Equipment Utilized During Treatment: Gait belt;Right knee immobilizer Activity Tolerance: Patient tolerated treatment well Patient left: with call bell/phone within reach;in chair;with chair alarm set     Time: 1030-1049 PT Time Calculation (min) (ACUTE ONLY): 19 min  Charges:  $Gait Training: 8-22 mins                    G Codes:      Annalisse Minkoff,[name] July 19, 2015, 2:00 PM Zenovia Jarred, PT, DPT 19-Jul-2015 Pager: [phone]

## 2015-06-28 NOTE — Progress Notes (Signed)
ANTICOAGULATION CONSULT NOTE  Pharmacy Consult for Coumadin Indication: atrial fibrillation, TAVR 9/14 (goal INR 2-3) VTE prophylaxis  Allergies  Allergen Reactions  . Nubain [Nalbuphine Hcl] Hives    Went into cardiac arrest   . Codeine Hives and Nausea Only  . Darvocet [Propoxyphene N-Acetaminophen] Nausea Only   Patient Measurements: Height: 4\' 10"  (147.3 cm) Weight: 180 lb (81.647 kg) IBW/kg (Calculated) : 40.9  Vital Signs: Temp: 97.9 F (36.6 C) (03/09 0424) Temp Source: Oral (03/09 0424) BP: 110/34 mmHg (03/09 0424) Pulse Rate: 60 (03/09 0424)  Labs:  Recent Labs  06/25/15 1420  06/25/15 2112 06/26/15 0339 06/27/15 0403 06/28/15 0341  HGB  --   < > 12.5 10.0* 9.4*  --   HCT  --   --  38.0 31.4* 30.2*  --   PLT  --   --  193 214 209  --   APTT 46*  --   --   --   --   --   LABPROT 27.4*  --   --  26.2* 30.6* 28.7*  INR 2.58*  --   --  2.44* 3.00* 2.87*  CREATININE  --   --  2.17* 2.44* 3.33*  --   < > = values in this interval not displayed.  Estimated Creatinine Clearance: 12 mL/min (by C-G formula based on Cr of 3.33).  Medications:  Warfarin PTA 2.5mg  daily.  LD reported as 2/28 (verified with son)  Assessment: 41 yoF on chronic Coumadin for Afib.  She was admitted on 3/6 for R patella tendon revision/repair.   She stopped Coumadin on 2/28 and transitioned to Lovenox, however INR was therapeutic (2.58) on admission (3/6) No Vitamin K or FFP documented as given pre-op.   No excessive bleeding reported.   Today, 06/28/2015: - INR now in therapeutic range after no additional warfarin doses since 2/28. - hgb down (probably secondary to ABLA), plt stable - no bleeding documented - tolerating regular diet today, prior intake not charted - on amiodarone, as per home regimen   Goal of Therapy:  INR 2-3 Monitor platelets by anticoagulation protocol: Yes   Plan:   Warfarin 0.5mg  today at 1800, discussed with ortho PA  Daily INR  Plan SNF rehab, INR  will need to be closely monitored with resumption of chronic Warfarin.  INR in am will help to guide subsequent Warfarin doses.  Otho Bellows PharmD Pager [phone] 06/28/2015, 12:31 PM

## 2015-06-28 NOTE — Care Management Important Message (Signed)
Important Message  Patient Details  Name: [name] MRN: [id number] Date of Birth: 12/01/1932   Medicare Important Message Given:  Yes    Haskell Flirt 06/28/2015, 1:00 PMImportant Message  Patient Details  Name: [name] MRN: [id number] Date of Birth: Jun 04, 1932   Medicare Important Message Given:  Yes    Haskell Flirt 06/28/2015, 1:00 PM

## 2015-06-28 NOTE — Progress Notes (Addendum)
     Subjective: 3 Days Post-Op Procedure(s) (LRB): RIGHT PATELLA TENDON REVISION/REPAIR (Right)   Patient reports pain as mild, pain controlled.  States that she had urinated more today. No other events throughout the night.  BUN and Creat are still elevating, and discussed with her that we will have to monitor for another day.  Objective:   VITALS:   Filed Vitals:   06/27/15 2114 06/28/15 0424  BP: 128/64 110/34  Pulse: 65 60  Temp: 97.9 F (36.6 C) 97.9 F (36.6 C)  Resp: 16 16    Dorsiflexion/Plantar flexion intact Incision: dressing C/D/I No cellulitis present Compartment soft  LABS  Recent Labs  06/26/15 0339 06/27/15 0403 06/28/15 1221  HGB 10.0* 9.4* 10.1*  HCT 31.4* 30.2* 31.6*  WBC 5.9 7.1 7.2  PLT 214 209 208     Recent Labs  06/26/15 0339 06/27/15 0403 06/28/15 1221  NA 142 142 141  K 4.6 4.3 4.3  BUN 54* 61* 76*  CREATININE 2.44* 3.33* 3.37*  GLUCOSE 142* 104* 106*     Assessment/Plan: 3 Days Post-Op Procedure(s) (LRB): RIGHT PATELLA TENDON REVISION/REPAIR (Right)   Up with therapy Discharge to SNF eventually, when ready    Anastasio Auerbach. Barbera Perritt   PAC  06/28/2015, 1:33 PM

## 2015-06-29 DIAGNOSIS — I5032 Chronic diastolic (congestive) heart failure: Secondary | ICD-10-CM | POA: Diagnosis not present

## 2015-06-29 DIAGNOSIS — R5381 Other malaise: Secondary | ICD-10-CM | POA: Diagnosis not present

## 2015-06-29 DIAGNOSIS — S86811D Strain of other muscle(s) and tendon(s) at lower leg level, right leg, subsequent encounter: Secondary | ICD-10-CM | POA: Diagnosis not present

## 2015-06-29 DIAGNOSIS — G8929 Other chronic pain: Secondary | ICD-10-CM | POA: Diagnosis not present

## 2015-06-29 DIAGNOSIS — S86191A Other injury of other muscle(s) and tendon(s) of posterior muscle group at lower leg level, right leg, initial encounter: Secondary | ICD-10-CM | POA: Diagnosis not present

## 2015-06-29 DIAGNOSIS — I10 Essential (primary) hypertension: Secondary | ICD-10-CM | POA: Diagnosis not present

## 2015-06-29 DIAGNOSIS — Z96651 Presence of right artificial knee joint: Secondary | ICD-10-CM | POA: Diagnosis not present

## 2015-06-29 DIAGNOSIS — R262 Difficulty in walking, not elsewhere classified: Secondary | ICD-10-CM | POA: Diagnosis not present

## 2015-06-29 DIAGNOSIS — Z4789 Encounter for other orthopedic aftercare: Secondary | ICD-10-CM | POA: Diagnosis not present

## 2015-06-29 DIAGNOSIS — N390 Urinary tract infection, site not specified: Secondary | ICD-10-CM | POA: Diagnosis not present

## 2015-06-29 DIAGNOSIS — I509 Heart failure, unspecified: Secondary | ICD-10-CM | POA: Diagnosis not present

## 2015-06-29 DIAGNOSIS — S86891D Other injury of other muscle(s) and tendon(s) at lower leg level, right leg, subsequent encounter: Secondary | ICD-10-CM | POA: Diagnosis not present

## 2015-06-29 DIAGNOSIS — I480 Paroxysmal atrial fibrillation: Secondary | ICD-10-CM | POA: Diagnosis not present

## 2015-06-29 DIAGNOSIS — R2681 Unsteadiness on feet: Secondary | ICD-10-CM | POA: Diagnosis not present

## 2015-06-29 DIAGNOSIS — Z471 Aftercare following joint replacement surgery: Secondary | ICD-10-CM | POA: Diagnosis not present

## 2015-06-29 DIAGNOSIS — S86811S Strain of other muscle(s) and tendon(s) at lower leg level, right leg, sequela: Secondary | ICD-10-CM | POA: Diagnosis not present

## 2015-06-29 DIAGNOSIS — M6281 Muscle weakness (generalized): Secondary | ICD-10-CM | POA: Diagnosis not present

## 2015-06-29 DIAGNOSIS — D508 Other iron deficiency anemias: Secondary | ICD-10-CM | POA: Diagnosis not present

## 2015-06-29 DIAGNOSIS — D649 Anemia, unspecified: Secondary | ICD-10-CM | POA: Diagnosis not present

## 2015-06-29 DIAGNOSIS — Z7901 Long term (current) use of anticoagulants: Secondary | ICD-10-CM | POA: Diagnosis not present

## 2015-06-29 LAB — BASIC METABOLIC PANEL
ANION GAP: 8 (ref 5–15)
BUN: 83 mg/dL — ABNORMAL HIGH (ref 6–20)
CALCIUM: 8.6 mg/dL — AB (ref 8.9–10.3)
CO2: 26 mmol/L (ref 22–32)
Chloride: 102 mmol/L (ref 101–111)
Creatinine, Ser: 2.93 mg/dL — ABNORMAL HIGH (ref 0.44–1.00)
GFR, EST AFRICAN AMERICAN: 16 mL/min — AB (ref >60)
GFR, EST NON AFRICAN AMERICAN: 14 mL/min — AB (ref >60)
GLUCOSE: 106 mg/dL — AB (ref 65–99)
Potassium: 4 mmol/L (ref 3.5–5.1)
Sodium: 136 mmol/L (ref 135–145)

## 2015-06-29 LAB — BASIC METABOLIC PANEL WITH GFR
BUN: 83 mg/dL — AB (ref 4–21)
Creatinine: 2.9 mg/dL — AB (ref 0.5–1.1)
Glucose: 106 mg/dL
Sodium: 136 mmol/L — AB (ref 137–147)

## 2015-06-29 LAB — CBC
HCT: 28.1 % — ABNORMAL LOW (ref 36.0–46.0)
Hemoglobin: 9.4 g/dL — ABNORMAL LOW (ref 12.0–15.0)
MCH: 29.3 pg (ref 26.0–34.0)
MCHC: 33.5 g/dL (ref 30.0–36.0)
MCV: 87.5 fL (ref 78.0–100.0)
PLATELETS: 200 10*3/uL (ref 150–400)
RBC: 3.21 MIL/uL — ABNORMAL LOW (ref 3.87–5.11)
RDW: 15.5 % (ref 11.5–15.5)
WBC: 6.4 10*3/uL (ref 4.0–10.5)

## 2015-06-29 LAB — CBC AND DIFFERENTIAL: WBC: 6.4 10*3/mL

## 2015-06-29 LAB — PROTIME-INR
INR: 2 — AB (ref 0.00–1.49)
Prothrombin Time: 21.9 seconds — ABNORMAL HIGH (ref 11.6–15.2)

## 2015-06-29 MED ORDER — WARFARIN SODIUM 5 MG PO TABS: 2.5000 mg | ORAL_TABLET | Freq: Every evening | ORAL | Status: DC

## 2015-06-29 NOTE — Discharge Summary (Signed)
Physician Discharge Summary  Patient ID: [name] MRN: [id number] DOB/AGE: [birth year] 80 y.o.  Admit date: 06/25/2015 Discharge date:  06/29/2015  Procedures:  Procedure(s) (LRB): RIGHT PATELLA TENDON REVISION/REPAIR (Right)  Attending Physician:  Dr. Durene Romans   Admission Diagnoses:   Reavulsion of right patella tendon  Discharge Diagnoses:  Principal Problem:   Right patellar tendon rupture  Past Medical History  Diagnosis Date  . Syncope   . Symptomatic bradycardia   . Chronic lower back pain   . Nonischemic cardiomyopathy (HCC)   . HTN (hypertension)   . HLD (hyperlipidemia)   . Atrial fibrillation (HCC)     tachy-brady syndrome with <1% recurrent PAF since pacemaker placement  . H/O: stroke   . Pulmonary embolism (HCC)     HISTORY OF, the pt. had a recurrent bilateral pulmonary emboli in 2005, on warfarin therapy and at which time she under went implantation of IVC filter  . Anemia   . Dementia   . Gastroesophageal reflux disease   . Aortic stenosis 10/13/2012    Low EF, low gradient with severe aortic stenosis confirmed by dobutamine stress echocardiogram s/p TAVR 12/2012  . PONV (postoperative nausea and vomiting)   . Osteoarthritis   . Neuropathy (HCC)   . Spinal stenosis   . Symptomatic bradycardia 2012    s/p [employer] PPM  . Chronic combined systolic and diastolic CHF (congestive heart failure) (HCC)   . Fibromyalgia   . Presence of permanent cardiac pacemaker   . Shortness of breath dyspnea     with exertion   . Pneumonia     hx of x 3   . Acute on chronic renal failure [facility])     sees Dr Allena Katz   . CKD (chronic kidney disease)   . Headache     hx of migraines   . On home oxygen therapy     patient uses at nite- 2L- has not used in > 6 months per patient  . Dysrhythmia   . Heart murmur   . Stroke (HCC)   . Asthma   . History of bronchitis   . Cataracts, bilateral   . Hard of hearing   . H/O dizziness   . Repeated falls   .  Hypothyroidism   . History of kidney stones   . Urinary incontinence   . History of urinary tract infection   . H/O urinary frequency   . Urinary urgency   . History of blood transfusion   . Aortic regurgitation   . Sciatica   . Sleep apnea     uses oxygen at night and PRN- not used since > 6 months / DOES NOT USE  C-PAP    HPI:    Pt is a 82 y.o. female complaining of inability to extend right right leg. She has had pain since the last knee surgery on 03/08/2015. She has worked with PT, but still hasn't been able to fully extend her right leg. X-rays in the clinic show previous TKA with patella superiorly displaced. Pt has tried various conservative treatments which have failed to alleviate their symptoms, including PT and brace. Various options are discussed with the patient. Risks, benefits and expectations were discussed with the patient. Patient understand the risks, benefits and expectations and wishes to proceed with surgery.   PCP: Pearson Grippe, MD   Discharged Condition: good  Hospital Course:  Patient underwent the above stated procedure on 06/25/2015. Patient tolerated the procedure well and brought to the  recovery room in good condition and subsequently to the floor.  POD #1 BP: 123/54 ; Pulse: 80 ; Temp: 97.8 F (36.6 C) ; Resp: 16 Patient reports pain as mild. Remains confused (baseline). No events. Neurovascular intact and incision: dressing C/D/I  LABS  Basename    HGB     10.0  HCT     31.4  BUN    54  Creat    2.44   INR     2.44   POD #2  BP: 181/63 ; Pulse: 66 ; Temp: 97.9 F (36.6 C) ; Resp: 18 Patient reports pain as mild, pain controlled. No events throughout the night.  Dorsiflexion/plantar flexion intact, incision: dressing C/D/I, no cellulitis present and compartment soft.   LABS  Basename    HGB     9.4  HCT     30.2  BUN    61  Creat    3.33  INR    3.00   POD #3  BP: 110/34 ; Pulse: 60 ; Temp: 97.9 F (36.6 C) ; Resp: 16 Patient reports  pain as mild, pain controlled. States that she had urinated more today. No other events throughout the night. BUN and Creat are still elevating, and discussed with her that we will have to monitor for another day. Dorsiflexion/plantar flexion intact, incision: dressing C/D/I, no cellulitis present and compartment soft.   LABS  Basename    HGB     10.1  HCT     31.6  BUN    76  Creat    3.37  INR    2.87   POD #4  BP: 163/51 ; Pulse: 72 ; Temp: 98.2 F ( 36.8 C) ; Resp: 18 Patient reports pain as mild, pain controlled. No events throughout the night. Urinating much better and creat has start to come down. Ready to be discharged to skilled nursing facility. Dorsiflexion/plantar flexion intact, incision: dressing C/D/I, no cellulitis present and compartment soft.   LABS  Basename    HGB     9.4  HCT     28.1  BUN    83  Creat    2.93  INR    2.00     Discharge Exam: General appearance: alert, cooperative and no distress Extremities: Homans sign is negative, no sign of DVT, no edema, redness or tenderness in the calves or thighs and no ulcers, gangrene or trophic changes  Disposition:    Skilled Nursing Facility with follow up in 2 weeks   Follow-up Information    Follow up with [facility] .   Specialty:  Skilled Nursing Facility   Contact information:   [address] [place] [address] [phone]      Follow up with [name], MD. Schedule an appointment as soon as possible for a visit in 2 weeks.   Specialty:  Orthopedic Surgery   Contact information:   [address] [place] [address] [phone]       Discharge Instructions    Call MD / Call 911    Complete by:  As directed   If you experience chest pain or shortness of breath, CALL 911 and be transported to the hospital emergency room.  If you develope a fever above 101 F, pus (white drainage) or increased drainage or redness at the wound, or calf pain, call  your surgeon's office.     Change dressing    Complete by:  As directed   Maintain  surgical dressing until follow up in the clinic. If the edges start to pull up, may reinforce with tape. If the dressing is no longer working, may remove and cover with gauze and tape, but must keep the area dry and clean.  Call with any questions or concerns.     Constipation Prevention    Complete by:  As directed   Drink plenty of fluids.  Prune juice may be helpful.  You may use a stool softener, such as Colace (over the counter) 100 mg twice a day.  Use MiraLax (over the counter) for constipation as needed.     Diet - low sodium heart healthy    Complete by:  As directed      Discharge instructions    Complete by:  As directed   Maintain surgical dressing until follow up in the clinic. If the edges start to pull up, may reinforce with tape. If the dressing is no longer working, may remove and cover with gauze and tape, but must keep the area dry and clean.  Follow up in 2 weeks at [facility]. Call with any questions or concerns.     TED hose    Complete by:  As directed   Use stockings (TED hose) for 2 weeks on both leg(s).  You may remove them at night for sleeping.     Weight bearing as tolerated    Complete by:  As directed   KNEE IMMOBILIZER ON AT ALL TIMES.  NO FLEXION OF THE RIGHT KNEE.  Laterality:  right  Extremity:  Lower             Medication List    STOP taking these medications        enoxaparin 40 MG/0.4ML injection  Commonly known as:  LOVENOX     HYDROcodone-acetaminophen 10-325 MG tablet  Commonly known as:  NORCO  Replaced by:  HYDROcodone-acetaminophen 7.5-325 MG tablet      TAKE these medications        alendronate 70 MG tablet  Commonly known as:  FOSAMAX  Take 70 mg by mouth once a week. Saturday.     amiodarone 200 MG tablet  Commonly known as:  PACERONE  Take 0.5 tablets (100 mg total) by mouth daily.     BIOTIN 5000 PO  Take 5,000 mcg by mouth  every evening.     calcitRIOL 0.25 MCG capsule  Commonly known as:  ROCALTROL  Take 0.25 mcg by mouth every morning.     carvedilol 12.5 MG tablet  Commonly known as:  COREG  Take 1 tablet by mouth 2 (two) times daily.     cetirizine 10 MG tablet  Commonly known as:  ZYRTEC  Take 10 mg by mouth every morning.     colchicine 0.6 MG tablet  Take 0.5 tablets (0.3 mg total) by mouth daily.     cycloSPORINE 0.05 % ophthalmic emulsion  Commonly known as:  RESTASIS  Place 1 drop into both eyes 2 (two) times daily as needed (dry eyes.).     docusate sodium 100 MG capsule  Commonly known as:  COLACE  Take 1 capsule (100 mg total) by mouth 2 (two) times daily.     donepezil 10 MG tablet  Commonly known as:  ARICEPT  Take 10 mg by mouth at bedtime.     DULoxetine 60 MG capsule  Commonly known as:  CYMBALTA  Take 60 mg by mouth every evening.     ferrous sulfate 325 (  65 FE) MG tablet  Take 1 tablet (325 mg total) by mouth 3 (three) times daily after meals.     hydrALAZINE 10 MG tablet  Commonly known as:  APRESOLINE  Take 10 mg by mouth 3 (three) times daily.     HYDROcodone-acetaminophen 7.5-325 MG tablet  Commonly known as:  NORCO  Take 1-2 tablets by mouth every 4 (four) hours as needed for moderate pain.     ipratropium-albuterol 0.5-2.5 (3) MG/3ML Soln  Commonly known as:  DUONEB  Take 3 mLs by nebulization daily as needed.     levothyroxine 112 MCG tablet  Commonly known as:  SYNTHROID, LEVOTHROID  Take 112 mcg by mouth daily before breakfast.     LINZESS 145 MCG Caps capsule  Generic drug:  Linaclotide  Take 145 mcg by mouth every other day. Alternates with the 290 mg tablet in the am.     Linaclotide 290 MCG Caps capsule  Commonly known as:  LINZESS  Take 290 mcg by mouth every other day. Alternates with the 145 mg tablets in the morning.     memantine 10 MG tablet  Commonly known as:  NAMENDA  Take 10 mg by mouth 2 (two) times daily.     multivitamin  tablet  Take 0.5 tablets by mouth every morning.     nitroGLYCERIN 0.4 MG SL tablet  Commonly known as:  NITROSTAT  Place 0.4 mg under the tongue every 5 (five) minutes as needed for chest pain (MAX 3 TABLETS).     NON FORMULARY  2 Liters oxygen as needed for shortness of breath     omeprazole 20 MG capsule  Commonly known as:  PRILOSEC  Take 20 mg by mouth daily.     polyethylene glycol packet  Commonly known as:  MIRALAX / GLYCOLAX  Take 17 g by mouth 2 (two) times daily.     pravastatin 40 MG tablet  Commonly known as:  PRAVACHOL  Take 40 mg by mouth every evening.     pregabalin 75 MG capsule  Commonly known as:  LYRICA  Take 75 mg by mouth 2 (two) times daily.     tiZANidine 4 MG tablet  Commonly known as:  ZANAFLEX  Take 1 tablet (4 mg total) by mouth every 6 (six) hours as needed for muscle spasms.     torsemide 20 MG tablet  Commonly known as:  DEMADEX  Take 20 mg by mouth 2 (two) times daily. PEr Dr Allena Katz - patient to not take on 8/14 nor am of 8/15     Turmeric 500 MG Caps  Take 1 capsule by mouth 2 (two) times daily.     ULORIC 40 MG tablet  Generic drug:  febuxostat  Take 40 mg by mouth every evening.     vitamin B-12 1000 MCG tablet  Commonly known as:  CYANOCOBALAMIN  Take 1,000 mcg by mouth 2 (two) times a week. Tuesday and Friday     Vitamin D 1000 units capsule  Take 2,000 Units by mouth at bedtime.     warfarin 5 MG tablet  Commonly known as:  COUMADIN  Take 0.5 tablets (2.5 mg total) by mouth every evening.         Signed: Anastasio Auerbach. Kaaliyah Kita   PA-C  06/29/2015, 7:48 AM

## 2015-06-29 NOTE — Progress Notes (Signed)
ANTICOAGULATION CONSULT NOTE  Pharmacy Consult for Coumadin Indication: atrial fibrillation, TAVR 9/14 (goal INR 2-3) VTE prophylaxis  Allergies  Allergen Reactions  . Nubain [Nalbuphine Hcl] Hives    Went into cardiac arrest   . Codeine Hives and Nausea Only  . Darvocet [Propoxyphene N-Acetaminophen] Nausea Only   Patient Measurements: Height: 4\' 10"  (147.3 cm) Weight: 180 lb (81.647 kg) IBW/kg (Calculated) : 40.9  Vital Signs: Temp: 98.2 F (36.8 C) (03/10 0406) Temp Source: Oral (03/10 0406) BP: 131/55 mmHg (03/10 0922) Pulse Rate: 72 (03/10 0922)  Labs:  Recent Labs  06/27/15 0403 06/28/15 0341 06/28/15 1221 06/29/15 0334  HGB 9.4*  --  10.1* 9.4*  HCT 30.2*  --  31.6* 28.1*  PLT 209  --  208 200  LABPROT 30.6* 28.7*  --  21.9*  INR 3.00* 2.87*  --  2.00*  CREATININE 3.33*  --  3.37* 2.93*    Estimated Creatinine Clearance: 13.6 mL/min (by C-G formula based on Cr of 2.93).  Medications:  Warfarin PTA 2.5mg  daily.  LD reported as 2/28 (verified with son)  Assessment: 27 yoF on chronic Coumadin for Afib.  She was admitted on 3/6 for R patella tendon revision/repair.   She stopped Coumadin on 2/28 and transitioned to Lovenox, however INR was therapeutic (2.58) on admission (3/6) No Vitamin K or FFP documented as given pre-op.   No excessive bleeding reported.   Today, 06/29/2015: - INR at low-end of therapeutic range - low dose warfarin 0.5mg  given last pm - hgb down (probably secondary to ABLA), plt stable - no bleeding documented - tolerating regular diet today, prior intake not charted - on amiodarone, as per home regimen   Goal of Therapy:  INR 2-3 Monitor platelets by anticoagulation protocol: Yes   Plan:   Patient to be discharged to SNF today.    INR will need to be closely monitored with resumption of chronic Warfarin as patient did not require warfarin dose for prolonged period of time.    Juliette Alcide, PharmD, BCPS.   Pager:  [phone] 06/29/2015 12:21 PM

## 2015-06-29 NOTE — Progress Notes (Signed)
Pt d/c to [facility]. Report given to nurse manager. Transporting via [facility].

## 2015-06-29 NOTE — Clinical Social Work Placement (Signed)
   CLINICAL SOCIAL WORK PLACEMENT  NOTE  Date:  06/29/2015  Patient Details  Name: [name] MRN: [id number] Date of Birth: 04-26-1932  Clinical Social Work is seeking post-discharge placement for this patient at the Skilled  Nursing Facility level of care (*CSW will initial, date and re-position this form in  chart as items are completed):  No   Patient/family provided with [facility] Clinical Social Work Department's list of facilities offering this level of care within the geographic area requested by the patient (or if unable, by the patient's family).  Yes   Patient/family informed of their freedom to choose among providers that offer the needed level of care, that participate in Medicare, Medicaid or managed care program needed by the patient, have an available bed and are willing to accept the patient.  Yes   Patient/family informed of [facility]'s ownership interest in [facility] and [facility], as well as of the fact that they are under no obligation to receive care at these facilities.  PASRR submitted to EDS on       PASRR number received on       Existing PASRR number confirmed on 06/26/15     FL2 transmitted to all facilities in geographic area requested by pt/family on 06/26/15     FL2 transmitted to all facilities within larger geographic area on       Patient informed that his/her managed care company has contracts with or will negotiate with certain facilities, including the following:        Yes   Patient/family informed of bed offers received.  Patient chooses bed at [facility]     Physician recommends and patient chooses bed at [facility]    Patient to be transferred to [facility] on 06/29/15.  Patient to be transferred to facility by [facility]     Patient family notified on 06/29/15 of transfer.  Name of family member notified:  SON     PHYSICIAN       Additional Comment: Pt / son are in agreement with d/c to [facility] today.  [facility] transport required. Pt / son are aware that out of pocket costs may be associated with [facility] transport. D/C Summary sent to SNF for review. Scripts included in d/c packet. # for report provided to nsg.     _______________________________________________ Royetta Asal, LCSW [phone] 06/29/2015, 4:12 PM

## 2015-06-29 NOTE — Progress Notes (Signed)
Physical Therapy Treatment Patient Details Name: [name] MRN: [id number] DOB: 1932-06-08 Today's Date: 07/10/2015    History of Present Illness Pt is an 82 year old female s/p right patella tendon revision/repair and PMHx of CVA, HTN, R TKA, previous R patellar tendon surgery    PT Comments    Pt assisted to bathroom and then ambulated short distance in hallway.  Follow Up Recommendations  SNF     Equipment Recommendations  None recommended by PT    Recommendations for Other Services       Precautions / Restrictions Precautions Precautions: Knee;Fall Required Braces or Orthoses: Knee Immobilizer - Right Knee Immobilizer - Right: On at all times Restrictions RLE Weight Bearing: Weight bearing as tolerated    Mobility  Bed Mobility Overal bed mobility: Needs Assistance Bed Mobility: Supine to Sit     Supine to sit: HOB elevated;Min guard     General bed mobility comments: verbal cues for self assist  Transfers Overall transfer level: Needs assistance Equipment used: Rolling walker (2 wheeled) Transfers: Sit to/from Stand Sit to Stand: Min assist         General transfer comment: verbal cues for safe technique, assist to rise  Ambulation/Gait Ambulation/Gait assistance: Min assist Ambulation Distance (Feet): 40 Feet Assistive device: Rolling walker (2 wheeled) Gait Pattern/deviations: Step-to pattern;Antalgic;Trunk flexed     General Gait Details: verbal cues for RW positioning, posture,  reports UE fatigue requiring a few standing rest breaks, also increased trunk flexion and pt not tolerating correcting posture as well today   Stairs            Wheelchair Mobility    Modified Rankin (Stroke Patients Only)       Balance                                    Cognition Arousal/Alertness: Awake/alert Behavior During Therapy: WFL for tasks assessed/performed Overall Cognitive Status: Within Functional Limits for tasks  assessed                      Exercises      General Comments        Pertinent Vitals/Pain Pain Assessment: Faces Faces Pain Scale: Hurts a little bit Pain Location: R LE Pain Descriptors / Indicators: Sore Pain Intervention(s): Limited activity within patient's tolerance;Monitored during session;Repositioned;Ice applied    Home Living                      Prior Function            PT Goals (current goals can now be found in the care plan section) Progress towards PT goals: Progressing toward goals    Frequency  Min 6X/week    PT Plan Current plan remains appropriate    Co-evaluation             End of Session Equipment Utilized During Treatment: Gait belt;Right knee immobilizer Activity Tolerance: Patient limited by fatigue Patient left: with call bell/phone within reach;in chair;with chair alarm set     Time: 0950-1013 PT Time Calculation (min) (ACUTE ONLY): 23 min  Charges:  $Gait Training: 8-22 mins $Therapeutic Activity: 8-22 mins                    G Codes:      Christy Friede,KATHrine E 07/10/15, 12:54 PM Zenovia Jarred, PT, DPT July 10, 2015 Pager: [phone]

## 2015-06-29 NOTE — Progress Notes (Addendum)
     Subjective: 4 Days Post-Op Procedure(s) (LRB): RIGHT PATELLA TENDON REVISION/REPAIR (Right)   Patient reports pain as mild, pain controlled. No events throughout the night.  Urinating much better and creat has start to come down.  Ready to be discharged home.  Objective:   VITALS:   Filed Vitals:   06/28/15 2120 06/29/15 0406  BP: 126/47 163/51  Pulse: 70 72  Temp: 98.8 F (37.1 C) 98.2 F (36.8 C)  Resp: 20 18    Dorsiflexion/Plantar flexion intact Incision: dressing C/D/I No cellulitis present Compartment soft  LABS  Recent Labs  06/27/15 0403 06/28/15 1221 06/29/15 0334  HGB 9.4* 10.1* 9.4*  HCT 30.2* 31.6* 28.1*  WBC 7.1 7.2 6.4  PLT 209 208 200     Recent Labs  06/27/15 0403 06/28/15 1221 06/29/15 0334  NA 142 141 136  K 4.3 4.3 4.0  BUN 61* 76* 83*  CREATININE 3.33* 3.37* 2.93*  GLUCOSE 104* 106* 106*     Assessment/Plan: 4 Days Post-Op Procedure(s) (LRB): RIGHT PATELLA TENDON REVISION/REPAIR (Right) Up with therapy Discharge to SNF  Follow up in 2 weeks at [facility]. Follow up with OLIN,Markos Theil D in 2 weeks.  Contact information:  [facility] [address] [place] [address] [phone]        [name]. [name]   PAC  06/29/2015, 7:40 AM

## 2015-07-02 ENCOUNTER — Non-Acute Institutional Stay (SKILLED_NURSING_FACILITY): Payer: Medicare Other | Admitting: Internal Medicine

## 2015-07-02 ENCOUNTER — Encounter: Payer: Self-pay | Admitting: Internal Medicine

## 2015-07-02 DIAGNOSIS — N183 Chronic kidney disease, stage 3 unspecified: Secondary | ICD-10-CM

## 2015-07-02 DIAGNOSIS — M810 Age-related osteoporosis without current pathological fracture: Secondary | ICD-10-CM

## 2015-07-02 DIAGNOSIS — D62 Acute posthemorrhagic anemia: Secondary | ICD-10-CM | POA: Diagnosis not present

## 2015-07-02 DIAGNOSIS — R300 Dysuria: Secondary | ICD-10-CM

## 2015-07-02 DIAGNOSIS — F0390 Unspecified dementia without behavioral disturbance: Secondary | ICD-10-CM

## 2015-07-02 DIAGNOSIS — R2681 Unsteadiness on feet: Secondary | ICD-10-CM

## 2015-07-02 DIAGNOSIS — K5900 Constipation, unspecified: Secondary | ICD-10-CM | POA: Diagnosis not present

## 2015-07-02 DIAGNOSIS — K219 Gastro-esophageal reflux disease without esophagitis: Secondary | ICD-10-CM | POA: Diagnosis not present

## 2015-07-02 DIAGNOSIS — S86811S Strain of other muscle(s) and tendon(s) at lower leg level, right leg, sequela: Secondary | ICD-10-CM | POA: Diagnosis not present

## 2015-07-02 DIAGNOSIS — I25119 Atherosclerotic heart disease of native coronary artery with unspecified angina pectoris: Secondary | ICD-10-CM

## 2015-07-02 DIAGNOSIS — J302 Other seasonal allergic rhinitis: Secondary | ICD-10-CM

## 2015-07-02 DIAGNOSIS — G629 Polyneuropathy, unspecified: Secondary | ICD-10-CM

## 2015-07-02 DIAGNOSIS — I5032 Chronic diastolic (congestive) heart failure: Secondary | ICD-10-CM | POA: Diagnosis not present

## 2015-07-02 DIAGNOSIS — I10 Essential (primary) hypertension: Secondary | ICD-10-CM | POA: Diagnosis not present

## 2015-07-02 DIAGNOSIS — K5909 Other constipation: Secondary | ICD-10-CM

## 2015-07-02 DIAGNOSIS — I480 Paroxysmal atrial fibrillation: Secondary | ICD-10-CM

## 2015-07-02 NOTE — Progress Notes (Signed)
LOCATION: [facility]  PCP: Pearson Grippe, MD   Code Status: Full Code  Goals of care: Advanced Directive information Advanced Directives 06/26/2015  Does patient have an advance directive? Yes  Type of Estate agent of [place];Living will  Copy of advanced directive(s) in chart? -       Extended Emergency Contact Information Primary Emergency Contact: Brashear,David Address: [address] [place], [place] [address] [place] of [place] Home Phone: [phone] Mobile Phone: [phone] Relation: Son Secondary Emergency Contact: Gray,Sharon Address: [address]          [place], [place] [address] [place] of [place] Home Phone: [phone] Mobile Phone: [phone] Relation: Daughter   Allergies  Allergen Reactions  . Nubain [Nalbuphine Hcl] Hives    Went into cardiac arrest   . Codeine Hives and Nausea Only  . Darvocet [Propoxyphene N-Acetaminophen] Nausea Only    Chief Complaint  Patient presents with  . New Admit To SNF    New Admission     HPI:  Patient is a 82 y.o. female seen today for short term rehabilitation post hospital admission from 06/25/15-06/29/15 with right patella tendon rupture. She underwent patella tendon repair by Dr Charlann Boxer. She is seen in her room today.   Review of Systems:  Constitutional: Negative for fever, chills and diaphoresis. Positive for malaise.  HENT: Negative for headache, congestion, hearing loss, sore throat, difficulty swallowing. Positive for chronic rhinitis and nasal discharge.   Eyes: Negative for blurred vision, double vision and discharge. Wears glasses.  Respiratory: Negative for cough, shortness of breath and wheezing.   Cardiovascular: Negative for chest pain, palpitations, leg swelling.  Gastrointestinal: Negative for heartburn, vomiting, abdominal pain, loss of appetite, melena, diarrhea and constipation. Positive for some nausea. Last bowel movement was yesterday.    Genitourinary: Negative for flank pain. Positive for pain and burning with urination. denies hematuria.  Musculoskeletal: Negative for fall in the facility. Positive for chronic back pain and multiple joints pain.  Skin: Negative for itching, rash.  Neurological: Positive for weakness and some dizziness. Psychiatric/Behavioral: Negative for depression, anxiety, insomnia and memory loss.    Past Medical History  Diagnosis Date  . Syncope   . Symptomatic bradycardia   . Chronic lower back pain   . Nonischemic cardiomyopathy (HCC)   . HTN (hypertension)   . HLD (hyperlipidemia)   . Atrial fibrillation (HCC)     tachy-brady syndrome with <1% recurrent PAF since pacemaker placement  . H/O: stroke   . Pulmonary embolism (HCC)     HISTORY OF, the pt. had a recurrent bilateral pulmonary emboli in 2005, on warfarin therapy and at which time she under went implantation of IVC filter  . Anemia   . Dementia   . Gastroesophageal reflux disease   . Aortic stenosis 10/13/2012    Low EF, low gradient with severe aortic stenosis confirmed by dobutamine stress echocardiogram s/p TAVR 12/2012  . PONV (postoperative nausea and vomiting)   . Osteoarthritis   . Neuropathy (HCC)   . Spinal stenosis   . Symptomatic bradycardia 2012    s/p Medtronic PPM  . Chronic combined systolic and diastolic CHF (congestive heart failure) (HCC)   . Fibromyalgia   . Presence of permanent cardiac pacemaker   . Shortness of breath dyspnea     with exertion   . Pneumonia     hx of x 3   . Acute on chronic renal failure (  [facility])     sees Dr Allena Katz   . CKD (chronic kidney disease)   . Headache     hx of migraines   . On home oxygen therapy     patient uses at nite- 2L- has not used in > 6 months per patient  . Dysrhythmia   . Heart murmur   . Stroke (HCC)   . Asthma   . History of bronchitis   . Cataracts, bilateral   . Hard of hearing   . H/O dizziness   . Repeated falls   . Hypothyroidism   . History of  kidney stones   . Urinary incontinence   . History of urinary tract infection   . H/O urinary frequency   . Urinary urgency   . History of blood transfusion   . Aortic regurgitation   . Sciatica   . Sleep apnea     uses oxygen at night and PRN- not used since > 6 months / DOES NOT USE  C-PAP   Past Surgical History  Procedure Laterality Date  . Pacemaker insertion      Medtronic  . Transcatheter aortic valve replacement, transfemoral  12/21/2012    a. 29mm Edwards Sapien XT transcatheter heart valve placed via open left transfemoral approach b. Intra-op TEE: well-seated bioprosthetic aortic valve with mean gradient 2 mmHg, trivial AI, mild MR, EF 30-35%  . Transcatheter aortic valve replacement, transfemoral N/A 12/21/2012    Procedure: TRANSCATHETER AORTIC VALVE REPLACEMENT, TRANSFEMORAL;  Surgeon: Tonny Bollman, MD;  Location: [facility] OR;  Service: Open Heart Surgery;  Laterality: N/A;  . Intraoperative transesophageal echocardiogram N/A 12/21/2012    Procedure: INTRAOPERATIVE TRANSESOPHAGEAL ECHOCARDIOGRAM;  Surgeon: Tonny Bollman, MD;  Location: [facility] OR;  Service: Open Heart Surgery;  Laterality: N/A;  . Central venous catheter insertion Left 12/21/2012    Procedure: INSERTION CENTRAL LINE ADULT;  Surgeon: Tonny Bollman, MD;  Location: [facility] OR;  Service: Open Heart Surgery;  Laterality: Left;  . Cataract extraction    . Bunionectomy    . Appendectomy    . Abdominal hysterectomy    . Thyroidectomy, partial    . Back surgery    . Knee surgery Left   . Nose surgery      X 2  . Left and right heart catheterization with coronary angiogram N/A 10/18/2012    Procedure: LEFT AND RIGHT HEART CATHETERIZATION WITH CORONARY ANGIOGRAM;  Surgeon: Tonny Bollman, MD;  Location: [facility] CATH LAB;  Service: Cardiovascular;  Laterality: N/A;  . Nasal septum surgery    . Total knee arthroplasty Right 12/04/2014    Procedure: RIGHT TOTAL  KNEE ARTHROPLASTY;  Surgeon: Durene Romans, MD;  Location: [facility] ORS;  Service:  Orthopedics;  Laterality: Right;  . Tonsillectomy    . Cardiac catheterization    . Shoulder surgery      bilat   . Elbow surgery      bilat   . Orif patella Right 03/08/2015    Procedure:  OPEN REDUCTION INTERNAL FIXATION RIGHT  PATELLA TENDON AVULSION;  Surgeon: Durene Romans, MD;  Location: [facility] ORS;  Service: Orthopedics;  Laterality: Right;  . Patellar tendon repair Right 06/25/2015    Procedure: RIGHT PATELLA TENDON REVISION/REPAIR;  Surgeon: Durene Romans, MD;  Location: [facility] ORS;  Service: Orthopedics;  Laterality: Right;   Social History:   reports that she has never smoked. She has never used smokeless tobacco. She reports that she does not drink alcohol or use illicit drugs.  Family History  Problem Relation  Age of Onset  . Ovarian cancer Mother     Deceased  . Epilepsy Father     Deceased    Medications:   Medication List       This list is accurate as of: 07/02/15 11:58 AM.  Always use your most recent med list.               alendronate 70 MG/75ML solution  Commonly known as:  FOSAMAX  Take 70 mg by mouth daily. Take with a full glass of water on an empty stomach.     amiodarone 100 MG tablet  Commonly known as:  PACERONE  Take 100 mg by mouth daily.     atorvastatin 10 MG tablet  Commonly known as:  LIPITOR  Take 10 mg by mouth daily.     BIOTIN 5000 PO  Take 5,000 mcg by mouth every evening.     calcitRIOL 0.25 MCG capsule  Commonly known as:  ROCALTROL  Take 0.25 mcg by mouth every morning.     carvedilol 12.5 MG tablet  Commonly known as:  COREG  Take 1 tablet by mouth 2 (two) times daily.     cetirizine 10 MG tablet  Commonly known as:  ZYRTEC  Take 10 mg by mouth every morning.     colchicine 0.6 MG tablet  Take 0.6 mg by mouth daily. Give 0.5 tablet to equal 0.3 mg by mouth daily     cycloSPORINE 0.05 % ophthalmic emulsion  Commonly known as:  RESTASIS  Place 1 drop into both eyes 2 (two) times daily as needed (dry eyes.).     docusate  sodium 100 MG capsule  Commonly known as:  COLACE  Take 1 capsule (100 mg total) by mouth 2 (two) times daily.     donepezil 10 MG tablet  Commonly known as:  ARICEPT  Take 10 mg by mouth at bedtime.     DULoxetine 60 MG capsule  Commonly known as:  CYMBALTA  Take 60 mg by mouth every evening.     ferrous sulfate 325 (65 FE) MG tablet  Take 325 mg by mouth 3 (three) times daily after meals.     hydrALAZINE 10 MG tablet  Commonly known as:  APRESOLINE  Take 10 mg by mouth 3 (three) times daily.     HYDROcodone-acetaminophen 7.5-325 MG tablet  Commonly known as:  NORCO  Take 1-2 tablets by mouth every 4 (four) hours as needed for moderate pain.     ipratropium-albuterol 0.5-2.5 (3) MG/3ML Soln  Commonly known as:  DUONEB  Take 3 mLs by nebulization daily as needed.     levothyroxine 112 MCG tablet  Commonly known as:  SYNTHROID, LEVOTHROID  Take 112 mcg by mouth daily.     LINZESS 145 MCG Caps capsule  Generic drug:  Linaclotide  Take 145 mcg by mouth every other day. Alternates with the 290 mg tablet in the am.     Linaclotide 290 MCG Caps capsule  Commonly known as:  LINZESS  Take 290 mcg by mouth every other day. Alternates with the 145 mg tablets in the morning.     memantine 10 MG tablet  Commonly known as:  NAMENDA  Take 10 mg by mouth 2 (two) times daily.     multivitamin tablet  Take 1 tablet by mouth daily.     nitroGLYCERIN 0.4 MG SL tablet  Commonly known as:  NITROSTAT  Place 0.4 mg under the tongue every 5 (five) minutes as needed  for chest pain (MAX 3 TABLETS).     NON FORMULARY  2 Liters oxygen as needed for shortness of breath     omeprazole 20 MG capsule  Commonly known as:  PRILOSEC  Take 20 mg by mouth daily.     polyethylene glycol packet  Commonly known as:  MIRALAX / GLYCOLAX  Take 17 g by mouth 2 (two) times daily.     pregabalin 75 MG capsule  Commonly known as:  LYRICA  Take 75 mg by mouth 2 (two) times daily.     tiZANidine 4 MG  tablet  Commonly known as:  ZANAFLEX  Take 1 tablet (4 mg total) by mouth every 6 (six) hours as needed for muscle spasms.     torsemide 20 MG tablet  Commonly known as:  DEMADEX  Take 20 mg by mouth 2 (two) times daily. PEr Dr Allena Katz - patient to not take on 8/14 nor am of 8/15     Turmeric 500 MG Caps  Take 1 capsule by mouth 2 (two) times daily.     ULORIC 40 MG tablet  Generic drug:  febuxostat  Take 40 mg by mouth every evening.     vitamin B-12 1000 MCG tablet  Commonly known as:  CYANOCOBALAMIN  Take 1,000 mcg by mouth 2 (two) times a week. Tuesday and Friday     Vitamin D 1000 units capsule  Take 2,000 Units by mouth at bedtime.     warfarin 5 MG tablet  Commonly known as:  COUMADIN  Take 0.5 tablets (2.5 mg total) by mouth every evening.        Immunizations: Immunization History  Administered Date(s) Administered  . PPD Test 06/29/2015     Physical Exam: Filed Vitals:   07/02/15 1132  BP: 140/62  Pulse: 69  Temp: 96.4 F (35.8 C)  TempSrc: Oral  Resp: 18  Height: 4\' 10"  (1.473 m)  Weight: 180 lb (81.647 kg)  SpO2: 95%   Body mass index is 37.63 kg/(m^2).  General- elderly female, obese, in no acute distress Head- normocephalic, atraumatic Nose- no maxillary or frontal sinus tenderness, no nasal discharge Throat- moist mucus membrane, has upper and lower dentures, has hoarseness to her voice  Eyes- PERRLA, EOMI, no pallor, no icterus, no discharge, normal conjunctiva, normal sclera Neck- no cervical lymphadenopathy Cardiovascular- normal s1,s2, no murmur, no leg edema Respiratory- bilateral clear to auscultation, no wheeze, no rhonchi, no crackles, no use of accessory muscles Abdomen- bowel sounds present, soft, non tender Musculoskeletal- able to move all 4 extremities, limited ROM to right knee, immobilizer to right knee Neurological- alert and oriented to person, place and time Skin- warm and dry, right knee surgical incision with dressing in  place Psychiatry- normal mood and affect    Labs reviewed: Basic Metabolic Panel:  Recent Labs  40/98/11 0403 06/28/15 1221 06/29/15 06/29/15 0334  NA 142 141 136* 136  K 4.3 4.3  --  4.0  CL 105 106  --  102  CO2 28 27  --  26  GLUCOSE 104* 106*  --  106*  BUN 61* 76* 83* 83*  CREATININE 3.33* 3.37* 2.9* 2.93*  CALCIUM 8.8* 9.0  --  8.6*   Liver Function Tests:  Recent Labs  12/15/14 0127  AST 48*  ALT 32  ALKPHOS 68  BILITOT 0.7  PROT 5.7*  ALBUMIN 3.2*   No results for input(s): LIPASE, AMYLASE in the last 8760 hours. No results for input(s): AMMONIA in the last  8760 hours. CBC:  Recent Labs  12/14/14 2000  06/27/15 0403 06/28/15 1221 06/29/15 06/29/15 0334  WBC 8.4  < > 7.1 7.2 6.4 6.4  NEUTROABS 5.2  --   --   --   --   --   HGB 8.3*  < > 9.4* 10.1*  --  9.4*  HCT 25.7*  < > 30.2* 31.6*  --  28.1*  MCV 95.9  < > 93.5 92.9  --  87.5  PLT 269  < > 209 208  --  200  < > = values in this interval not displayed. Cardiac Enzymes: No results for input(s): CKTOTAL, CKMB, CKMBINDEX, TROPONINI in the last 8760 hours. BNP: Invalid input(s): POCBNP CBG:  Recent Labs  12/07/14 1211 12/15/14 0737  GLUCAP 144* 104*     Assessment/Plan  Unsteady gait Post patella tendon rupture and repair. Will have patient work with PT/OT as tolerated to regain strength and restore function.  Fall precautions are in place.  Right patella tendon avulsion S/p patellar tendon repair. Continue immobilizer to her right knee. Has f/u with orthopedics. Continue ted hose and no flexion to right knee. Will have her work with physical therapy and occupational therapy team to help with gait training and muscle strengthening exercises.fall precautions. Skin care. Encourage to be out of bed. Continue norco 7.5-325 mg 1-2 tab q4h prn pain and tizanidine 4 mg q6h prn muscle spasm. Continue coumadin for dvt prophylaxis.   Blood loss anemia Post op from blood loss. Check cbc. Continue  ferrous sulfate 325 mg tid  afib Rate controlled. continue coreg 12.5 mg bid and amiodarone 100 mg daily. Continue coumadin and monitor inr and heart rate  Dysuria Send u/a with c/s, hydration encouraged.   Osteoporosis Continue weekly fosamax and calcitriol for now. Fall precautions  Allergic rhinitis continue cetirizine 10 mg daily and monitor  gerd Stable, continue prilosec 20 mg daily  Hypertension with renal disease Monitor bp. Continue hydralazine 10 mg tid with coreg 12.5 mg bid for now. Monitor bp bid x 1 week  ckd stage 3 Monitor renal function  Dementia Can make meaningful conversation. Continue donepezil and memantine for now. Assistance with ADLs as needed. Fall precaution  Chronic constipation Currently on Colace 100 mg bid, Linzess alternating dosing and miralax bid. Hydration encouraged  CHF Appears euvolemic. Continue torsemide, coreg and hydralazine.monitor bmp  Neuropathy Continue Lyrica 75 mg bid and cymbalta 60 mg daily  CAD Remains chest pain free. Continue coreg, hydralazine, prn NTG, statin and monitor   Goals of care: short term rehabilitation but possible long term care   Labs/tests ordered: cbc with diff, cmp    Family/ staff Communication: reviewed care plan with patient and nursing supervisor    Oneal Grout, MD Internal Medicine [facility] [address] [place], [place] [address] Cell Phone (Monday-Friday 8 am - 5 pm): [phone] On Call: [phone] and follow prompts after 5 pm and on weekends Office Phone: [phone] Office Fax: [phone]

## 2015-07-03 LAB — CBC AND DIFFERENTIAL
HCT: 28 % — AB (ref 36–46)
Hemoglobin: 9.1 g/dL — AB (ref 12.0–16.0)
Neutrophils Absolute: 3 /uL
Platelets: 229 10*3/uL (ref 150–399)
WBC: 5.7 10*3/mL

## 2015-07-03 LAB — BASIC METABOLIC PANEL
BUN: 73 mg/dL — AB (ref 4–21)
Creatinine: 2.2 mg/dL — AB (ref 0.5–1.1)
GLUCOSE: 88 mg/dL
POTASSIUM: 3.8 mmol/L (ref 3.4–5.3)
SODIUM: 146 mmol/L (ref 137–147)

## 2015-07-03 LAB — HEPATIC FUNCTION PANEL
ALT: 4 U/L — AB (ref 7–35)
AST: 15 U/L (ref 13–35)
Alkaline Phosphatase: 70 U/L (ref 25–125)
Bilirubin, Total: 0.4 mg/dL

## 2015-07-06 DIAGNOSIS — S86891D Other injury of other muscle(s) and tendon(s) at lower leg level, right leg, subsequent encounter: Secondary | ICD-10-CM | POA: Diagnosis not present

## 2015-07-06 DIAGNOSIS — R262 Difficulty in walking, not elsewhere classified: Secondary | ICD-10-CM | POA: Diagnosis not present

## 2015-07-06 DIAGNOSIS — R5381 Other malaise: Secondary | ICD-10-CM | POA: Diagnosis not present

## 2015-07-06 DIAGNOSIS — Z4789 Encounter for other orthopedic aftercare: Secondary | ICD-10-CM | POA: Diagnosis not present

## 2015-07-06 DIAGNOSIS — G8929 Other chronic pain: Secondary | ICD-10-CM | POA: Diagnosis not present

## 2015-07-09 DIAGNOSIS — G8929 Other chronic pain: Secondary | ICD-10-CM | POA: Diagnosis not present

## 2015-07-09 DIAGNOSIS — R5381 Other malaise: Secondary | ICD-10-CM | POA: Diagnosis not present

## 2015-07-09 DIAGNOSIS — Z4789 Encounter for other orthopedic aftercare: Secondary | ICD-10-CM | POA: Diagnosis not present

## 2015-07-09 DIAGNOSIS — S86891D Other injury of other muscle(s) and tendon(s) at lower leg level, right leg, subsequent encounter: Secondary | ICD-10-CM | POA: Diagnosis not present

## 2015-07-09 DIAGNOSIS — R262 Difficulty in walking, not elsewhere classified: Secondary | ICD-10-CM | POA: Diagnosis not present

## 2015-07-10 DIAGNOSIS — S86891D Other injury of other muscle(s) and tendon(s) at lower leg level, right leg, subsequent encounter: Secondary | ICD-10-CM | POA: Diagnosis not present

## 2015-07-10 DIAGNOSIS — Z4789 Encounter for other orthopedic aftercare: Secondary | ICD-10-CM | POA: Diagnosis not present

## 2015-07-10 DIAGNOSIS — R262 Difficulty in walking, not elsewhere classified: Secondary | ICD-10-CM | POA: Diagnosis not present

## 2015-07-10 DIAGNOSIS — G8929 Other chronic pain: Secondary | ICD-10-CM | POA: Diagnosis not present

## 2015-07-10 DIAGNOSIS — R5381 Other malaise: Secondary | ICD-10-CM | POA: Diagnosis not present

## 2015-07-11 DIAGNOSIS — Z4789 Encounter for other orthopedic aftercare: Secondary | ICD-10-CM | POA: Diagnosis not present

## 2015-07-11 DIAGNOSIS — R262 Difficulty in walking, not elsewhere classified: Secondary | ICD-10-CM | POA: Diagnosis not present

## 2015-07-11 DIAGNOSIS — R5381 Other malaise: Secondary | ICD-10-CM | POA: Diagnosis not present

## 2015-07-11 DIAGNOSIS — G8929 Other chronic pain: Secondary | ICD-10-CM | POA: Diagnosis not present

## 2015-07-11 DIAGNOSIS — S86891D Other injury of other muscle(s) and tendon(s) at lower leg level, right leg, subsequent encounter: Secondary | ICD-10-CM | POA: Diagnosis not present

## 2015-07-12 DIAGNOSIS — R262 Difficulty in walking, not elsewhere classified: Secondary | ICD-10-CM | POA: Diagnosis not present

## 2015-07-12 DIAGNOSIS — Z4789 Encounter for other orthopedic aftercare: Secondary | ICD-10-CM | POA: Diagnosis not present

## 2015-07-12 DIAGNOSIS — S86811D Strain of other muscle(s) and tendon(s) at lower leg level, right leg, subsequent encounter: Secondary | ICD-10-CM | POA: Diagnosis not present

## 2015-07-12 DIAGNOSIS — G8929 Other chronic pain: Secondary | ICD-10-CM | POA: Diagnosis not present

## 2015-07-12 DIAGNOSIS — R5381 Other malaise: Secondary | ICD-10-CM | POA: Diagnosis not present

## 2015-07-12 DIAGNOSIS — S86891D Other injury of other muscle(s) and tendon(s) at lower leg level, right leg, subsequent encounter: Secondary | ICD-10-CM | POA: Diagnosis not present

## 2015-07-13 DIAGNOSIS — R262 Difficulty in walking, not elsewhere classified: Secondary | ICD-10-CM | POA: Diagnosis not present

## 2015-07-13 DIAGNOSIS — G8929 Other chronic pain: Secondary | ICD-10-CM | POA: Diagnosis not present

## 2015-07-13 DIAGNOSIS — S86891D Other injury of other muscle(s) and tendon(s) at lower leg level, right leg, subsequent encounter: Secondary | ICD-10-CM | POA: Diagnosis not present

## 2015-07-13 DIAGNOSIS — R5381 Other malaise: Secondary | ICD-10-CM | POA: Diagnosis not present

## 2015-07-13 DIAGNOSIS — Z4789 Encounter for other orthopedic aftercare: Secondary | ICD-10-CM | POA: Diagnosis not present

## 2015-07-13 LAB — CBC AND DIFFERENTIAL
HCT: 30 % — AB (ref 36–46)
HEMOGLOBIN: 9.9 g/dL — AB (ref 12.0–16.0)
Neutrophils Absolute: 4 /uL
Platelets: 292 10*3/uL (ref 150–399)
WBC: 6.4 10^3/mL

## 2015-07-16 ENCOUNTER — Encounter: Payer: Self-pay | Admitting: Adult Health

## 2015-07-16 ENCOUNTER — Non-Acute Institutional Stay (SKILLED_NURSING_FACILITY): Payer: Medicare Other | Admitting: Adult Health

## 2015-07-16 DIAGNOSIS — Z4789 Encounter for other orthopedic aftercare: Secondary | ICD-10-CM | POA: Diagnosis not present

## 2015-07-16 DIAGNOSIS — S86891D Other injury of other muscle(s) and tendon(s) at lower leg level, right leg, subsequent encounter: Secondary | ICD-10-CM | POA: Diagnosis not present

## 2015-07-16 DIAGNOSIS — I480 Paroxysmal atrial fibrillation: Secondary | ICD-10-CM

## 2015-07-16 DIAGNOSIS — R262 Difficulty in walking, not elsewhere classified: Secondary | ICD-10-CM | POA: Diagnosis not present

## 2015-07-16 DIAGNOSIS — R5381 Other malaise: Secondary | ICD-10-CM | POA: Diagnosis not present

## 2015-07-16 DIAGNOSIS — Z7901 Long term (current) use of anticoagulants: Secondary | ICD-10-CM

## 2015-07-16 DIAGNOSIS — G8929 Other chronic pain: Secondary | ICD-10-CM | POA: Diagnosis not present

## 2015-07-16 NOTE — Progress Notes (Signed)
Patient ID: [name], female   DOB: [birth year], 82 y.o.   MRN: [id number] Subjective:     Indication: atrial fibrillation Bleeding signs/symptoms: None Thromboembolic signs/symptoms: None  Missed Coumadin doses: This week - 3X due to supratherapeutic INR Medication changes: no Dietary changes: no Bacterial/viral infection: no Other concerns: no  The following portions of the patient's history were reviewed and updated as appropriate: allergies, current medications, past family history, past medical history, past social history, past surgical history and problem list.  Review of Systems A comprehensive review of systems was negative.   Objective:    INR Today: 2.9 Current dose: Coumadin 3 mg daily  Assessment:    Therapeutic INR for goal of 2-3   Plan:    1. New dose: decrease Coumadin to 2 mg daily   2. Next INR: 07/19/15

## 2015-07-18 DIAGNOSIS — G8929 Other chronic pain: Secondary | ICD-10-CM | POA: Diagnosis not present

## 2015-07-18 DIAGNOSIS — R5381 Other malaise: Secondary | ICD-10-CM | POA: Diagnosis not present

## 2015-07-18 DIAGNOSIS — R262 Difficulty in walking, not elsewhere classified: Secondary | ICD-10-CM | POA: Diagnosis not present

## 2015-07-18 DIAGNOSIS — Z4789 Encounter for other orthopedic aftercare: Secondary | ICD-10-CM | POA: Diagnosis not present

## 2015-07-18 DIAGNOSIS — S86891D Other injury of other muscle(s) and tendon(s) at lower leg level, right leg, subsequent encounter: Secondary | ICD-10-CM | POA: Diagnosis not present

## 2015-07-19 DIAGNOSIS — G8929 Other chronic pain: Secondary | ICD-10-CM | POA: Diagnosis not present

## 2015-07-19 DIAGNOSIS — R262 Difficulty in walking, not elsewhere classified: Secondary | ICD-10-CM | POA: Diagnosis not present

## 2015-07-19 DIAGNOSIS — Z4789 Encounter for other orthopedic aftercare: Secondary | ICD-10-CM | POA: Diagnosis not present

## 2015-07-19 DIAGNOSIS — Z96651 Presence of right artificial knee joint: Secondary | ICD-10-CM | POA: Diagnosis not present

## 2015-07-19 DIAGNOSIS — R5381 Other malaise: Secondary | ICD-10-CM | POA: Diagnosis not present

## 2015-07-19 DIAGNOSIS — S86891D Other injury of other muscle(s) and tendon(s) at lower leg level, right leg, subsequent encounter: Secondary | ICD-10-CM | POA: Diagnosis not present

## 2015-07-19 DIAGNOSIS — Z471 Aftercare following joint replacement surgery: Secondary | ICD-10-CM | POA: Diagnosis not present

## 2015-07-20 DIAGNOSIS — R262 Difficulty in walking, not elsewhere classified: Secondary | ICD-10-CM | POA: Diagnosis not present

## 2015-07-20 DIAGNOSIS — S86891D Other injury of other muscle(s) and tendon(s) at lower leg level, right leg, subsequent encounter: Secondary | ICD-10-CM | POA: Diagnosis not present

## 2015-07-20 DIAGNOSIS — R5381 Other malaise: Secondary | ICD-10-CM | POA: Diagnosis not present

## 2015-07-20 DIAGNOSIS — G8929 Other chronic pain: Secondary | ICD-10-CM | POA: Diagnosis not present

## 2015-07-20 DIAGNOSIS — Z4789 Encounter for other orthopedic aftercare: Secondary | ICD-10-CM | POA: Diagnosis not present

## 2015-07-21 DIAGNOSIS — E039 Hypothyroidism, unspecified: Secondary | ICD-10-CM | POA: Diagnosis not present

## 2015-07-21 DIAGNOSIS — M6281 Muscle weakness (generalized): Secondary | ICD-10-CM | POA: Diagnosis not present

## 2015-07-21 DIAGNOSIS — R488 Other symbolic dysfunctions: Secondary | ICD-10-CM | POA: Diagnosis not present

## 2015-07-21 DIAGNOSIS — N390 Urinary tract infection, site not specified: Secondary | ICD-10-CM | POA: Diagnosis not present

## 2015-07-21 DIAGNOSIS — M5430 Sciatica, unspecified side: Secondary | ICD-10-CM | POA: Diagnosis not present

## 2015-07-21 DIAGNOSIS — Z8673 Personal history of transient ischemic attack (TIA), and cerebral infarction without residual deficits: Secondary | ICD-10-CM | POA: Diagnosis not present

## 2015-07-21 DIAGNOSIS — M1990 Unspecified osteoarthritis, unspecified site: Secondary | ICD-10-CM | POA: Diagnosis not present

## 2015-07-21 DIAGNOSIS — E785 Hyperlipidemia, unspecified: Secondary | ICD-10-CM | POA: Diagnosis not present

## 2015-07-21 DIAGNOSIS — R55 Syncope and collapse: Secondary | ICD-10-CM | POA: Diagnosis not present

## 2015-07-21 DIAGNOSIS — Z79899 Other long term (current) drug therapy: Secondary | ICD-10-CM | POA: Diagnosis not present

## 2015-07-21 DIAGNOSIS — F0390 Unspecified dementia without behavioral disturbance: Secondary | ICD-10-CM | POA: Diagnosis not present

## 2015-07-21 DIAGNOSIS — Z7901 Long term (current) use of anticoagulants: Secondary | ICD-10-CM | POA: Diagnosis not present

## 2015-07-21 DIAGNOSIS — I10 Essential (primary) hypertension: Secondary | ICD-10-CM | POA: Diagnosis not present

## 2015-07-21 DIAGNOSIS — Z9181 History of falling: Secondary | ICD-10-CM | POA: Diagnosis not present

## 2015-07-21 DIAGNOSIS — S86891D Other injury of other muscle(s) and tendon(s) at lower leg level, right leg, subsequent encounter: Secondary | ICD-10-CM | POA: Diagnosis not present

## 2015-07-21 DIAGNOSIS — G8929 Other chronic pain: Secondary | ICD-10-CM | POA: Diagnosis not present

## 2015-07-21 DIAGNOSIS — L89511 Pressure ulcer of right ankle, stage 1: Secondary | ICD-10-CM | POA: Diagnosis not present

## 2015-07-21 DIAGNOSIS — Z4789 Encounter for other orthopedic aftercare: Secondary | ICD-10-CM | POA: Diagnosis not present

## 2015-07-21 DIAGNOSIS — G629 Polyneuropathy, unspecified: Secondary | ICD-10-CM | POA: Diagnosis not present

## 2015-07-21 DIAGNOSIS — M66269 Spontaneous rupture of extensor tendons, unspecified lower leg: Secondary | ICD-10-CM | POA: Diagnosis not present

## 2015-07-21 DIAGNOSIS — Z44101 Encounter for fitting and adjustment of unspecified right artificial leg: Secondary | ICD-10-CM | POA: Diagnosis present

## 2015-07-21 DIAGNOSIS — N189 Chronic kidney disease, unspecified: Secondary | ICD-10-CM | POA: Diagnosis not present

## 2015-07-21 DIAGNOSIS — R262 Difficulty in walking, not elsewhere classified: Secondary | ICD-10-CM | POA: Diagnosis not present

## 2015-07-21 DIAGNOSIS — I5032 Chronic diastolic (congestive) heart failure: Secondary | ICD-10-CM | POA: Diagnosis not present

## 2015-07-21 DIAGNOSIS — L8990 Pressure ulcer of unspecified site, unspecified stage: Secondary | ICD-10-CM | POA: Diagnosis not present

## 2015-07-21 DIAGNOSIS — Z8701 Personal history of pneumonia (recurrent): Secondary | ICD-10-CM | POA: Diagnosis not present

## 2015-07-21 DIAGNOSIS — M79604 Pain in right leg: Secondary | ICD-10-CM | POA: Diagnosis not present

## 2015-07-21 DIAGNOSIS — S86811S Strain of other muscle(s) and tendon(s) at lower leg level, right leg, sequela: Secondary | ICD-10-CM | POA: Diagnosis not present

## 2015-07-21 DIAGNOSIS — Z86711 Personal history of pulmonary embolism: Secondary | ICD-10-CM | POA: Diagnosis not present

## 2015-07-21 DIAGNOSIS — M797 Fibromyalgia: Secondary | ICD-10-CM | POA: Diagnosis not present

## 2015-07-21 DIAGNOSIS — I5042 Chronic combined systolic (congestive) and diastolic (congestive) heart failure: Secondary | ICD-10-CM | POA: Diagnosis not present

## 2015-07-21 DIAGNOSIS — K219 Gastro-esophageal reflux disease without esophagitis: Secondary | ICD-10-CM | POA: Diagnosis not present

## 2015-07-21 DIAGNOSIS — I129 Hypertensive chronic kidney disease with stage 1 through stage 4 chronic kidney disease, or unspecified chronic kidney disease: Secondary | ICD-10-CM | POA: Diagnosis not present

## 2015-07-21 DIAGNOSIS — Z87440 Personal history of urinary (tract) infections: Secondary | ICD-10-CM | POA: Diagnosis not present

## 2015-07-21 DIAGNOSIS — J45909 Unspecified asthma, uncomplicated: Secondary | ICD-10-CM | POA: Diagnosis not present

## 2015-07-21 DIAGNOSIS — I4891 Unspecified atrial fibrillation: Secondary | ICD-10-CM | POA: Diagnosis not present

## 2015-07-21 DIAGNOSIS — G43909 Migraine, unspecified, not intractable, without status migrainosus: Secondary | ICD-10-CM | POA: Diagnosis not present

## 2015-07-21 DIAGNOSIS — H9190 Unspecified hearing loss, unspecified ear: Secondary | ICD-10-CM | POA: Diagnosis not present

## 2015-07-21 DIAGNOSIS — Z9981 Dependence on supplemental oxygen: Secondary | ICD-10-CM | POA: Diagnosis not present

## 2015-07-21 DIAGNOSIS — I480 Paroxysmal atrial fibrillation: Secondary | ICD-10-CM | POA: Diagnosis not present

## 2015-07-21 DIAGNOSIS — R5381 Other malaise: Secondary | ICD-10-CM | POA: Diagnosis not present

## 2015-07-21 DIAGNOSIS — D649 Anemia, unspecified: Secondary | ICD-10-CM | POA: Diagnosis not present

## 2015-07-21 DIAGNOSIS — R011 Cardiac murmur, unspecified: Secondary | ICD-10-CM | POA: Diagnosis not present

## 2015-07-23 ENCOUNTER — Encounter: Payer: Self-pay | Admitting: Adult Health

## 2015-07-23 ENCOUNTER — Emergency Department (HOSPITAL_COMMUNITY)
Admission: EM | Admit: 2015-07-23 | Discharge: 2015-07-23 | Disposition: A | Discharge status: Home / Self Care | Payer: Medicare Other | Attending: Emergency Medicine | Admitting: Emergency Medicine

## 2015-07-23 ENCOUNTER — Encounter (HOSPITAL_COMMUNITY): Payer: Self-pay | Admitting: Emergency Medicine

## 2015-07-23 ENCOUNTER — Non-Acute Institutional Stay (SKILLED_NURSING_FACILITY): Payer: Medicare Other | Admitting: Adult Health

## 2015-07-23 DIAGNOSIS — Z9181 History of falling: Secondary | ICD-10-CM | POA: Diagnosis not present

## 2015-07-23 DIAGNOSIS — M797 Fibromyalgia: Secondary | ICD-10-CM | POA: Diagnosis not present

## 2015-07-23 DIAGNOSIS — Z9981 Dependence on supplemental oxygen: Secondary | ICD-10-CM | POA: Diagnosis not present

## 2015-07-23 DIAGNOSIS — I5042 Chronic combined systolic (congestive) and diastolic (congestive) heart failure: Secondary | ICD-10-CM | POA: Diagnosis not present

## 2015-07-23 DIAGNOSIS — I129 Hypertensive chronic kidney disease with stage 1 through stage 4 chronic kidney disease, or unspecified chronic kidney disease: Secondary | ICD-10-CM | POA: Diagnosis not present

## 2015-07-23 DIAGNOSIS — K219 Gastro-esophageal reflux disease without esophagitis: Secondary | ICD-10-CM | POA: Insufficient documentation

## 2015-07-23 DIAGNOSIS — Z86711 Personal history of pulmonary embolism: Secondary | ICD-10-CM | POA: Diagnosis not present

## 2015-07-23 DIAGNOSIS — M1990 Unspecified osteoarthritis, unspecified site: Secondary | ICD-10-CM | POA: Diagnosis not present

## 2015-07-23 DIAGNOSIS — E785 Hyperlipidemia, unspecified: Secondary | ICD-10-CM | POA: Insufficient documentation

## 2015-07-23 DIAGNOSIS — Z79899 Other long term (current) drug therapy: Secondary | ICD-10-CM | POA: Insufficient documentation

## 2015-07-23 DIAGNOSIS — D649 Anemia, unspecified: Secondary | ICD-10-CM | POA: Diagnosis not present

## 2015-07-23 DIAGNOSIS — R011 Cardiac murmur, unspecified: Secondary | ICD-10-CM | POA: Diagnosis not present

## 2015-07-23 DIAGNOSIS — Z8673 Personal history of transient ischemic attack (TIA), and cerebral infarction without residual deficits: Secondary | ICD-10-CM | POA: Diagnosis not present

## 2015-07-23 DIAGNOSIS — I480 Paroxysmal atrial fibrillation: Secondary | ICD-10-CM

## 2015-07-23 DIAGNOSIS — Z7901 Long term (current) use of anticoagulants: Secondary | ICD-10-CM | POA: Insufficient documentation

## 2015-07-23 DIAGNOSIS — G43909 Migraine, unspecified, not intractable, without status migrainosus: Secondary | ICD-10-CM | POA: Diagnosis not present

## 2015-07-23 DIAGNOSIS — E039 Hypothyroidism, unspecified: Secondary | ICD-10-CM | POA: Diagnosis not present

## 2015-07-23 DIAGNOSIS — L89511 Pressure ulcer of right ankle, stage 1: Secondary | ICD-10-CM | POA: Insufficient documentation

## 2015-07-23 DIAGNOSIS — Z8701 Personal history of pneumonia (recurrent): Secondary | ICD-10-CM | POA: Insufficient documentation

## 2015-07-23 DIAGNOSIS — G629 Polyneuropathy, unspecified: Secondary | ICD-10-CM | POA: Insufficient documentation

## 2015-07-23 DIAGNOSIS — S86891D Other injury of other muscle(s) and tendon(s) at lower leg level, right leg, subsequent encounter: Secondary | ICD-10-CM | POA: Diagnosis not present

## 2015-07-23 DIAGNOSIS — Z87440 Personal history of urinary (tract) infections: Secondary | ICD-10-CM | POA: Diagnosis not present

## 2015-07-23 DIAGNOSIS — M5430 Sciatica, unspecified side: Secondary | ICD-10-CM | POA: Diagnosis not present

## 2015-07-23 DIAGNOSIS — G8929 Other chronic pain: Secondary | ICD-10-CM | POA: Diagnosis not present

## 2015-07-23 DIAGNOSIS — N189 Chronic kidney disease, unspecified: Secondary | ICD-10-CM | POA: Diagnosis not present

## 2015-07-23 DIAGNOSIS — R262 Difficulty in walking, not elsewhere classified: Secondary | ICD-10-CM | POA: Diagnosis not present

## 2015-07-23 DIAGNOSIS — I4891 Unspecified atrial fibrillation: Secondary | ICD-10-CM | POA: Insufficient documentation

## 2015-07-23 DIAGNOSIS — J45909 Unspecified asthma, uncomplicated: Secondary | ICD-10-CM | POA: Insufficient documentation

## 2015-07-23 DIAGNOSIS — R5381 Other malaise: Secondary | ICD-10-CM | POA: Diagnosis not present

## 2015-07-23 DIAGNOSIS — H9190 Unspecified hearing loss, unspecified ear: Secondary | ICD-10-CM | POA: Diagnosis not present

## 2015-07-23 DIAGNOSIS — F0390 Unspecified dementia without behavioral disturbance: Secondary | ICD-10-CM | POA: Insufficient documentation

## 2015-07-23 DIAGNOSIS — Z4789 Encounter for other orthopedic aftercare: Secondary | ICD-10-CM | POA: Diagnosis not present

## 2015-07-23 MED ORDER — ONDANSETRON 4 MG PO TBDP: 4.0000 mg | ORAL_TABLET | Freq: Once | ORAL | Status: DC | PRN

## 2015-07-23 MED ORDER — ACETAMINOPHEN 325 MG PO TABS
650.0000 mg | ORAL_TABLET | Freq: Once | ORAL | Status: AC
  Administered 2015-07-23: 650 mg via ORAL
  Filled 2015-07-23: qty 2

## 2015-07-23 MED ORDER — IBUPROFEN 200 MG PO TABS: 400.0000 mg | ORAL_TABLET | Freq: Once | ORAL | Status: DC

## 2015-07-23 NOTE — Discharge Instructions (Signed)
You have been seen today for cast problem causing a pressure ulcer. Your cast was modified to prevent pressure in this area. Follow-up with your orthopedic surgeon on this matter. Follow up with PCP as needed. Return to ED should symptoms worsen.

## 2015-07-23 NOTE — ED Notes (Signed)
Awake. Verbally responsive. A/O x4. Resp even and unlabored. No audible adventitious breath sounds noted. ABC's intact.  

## 2015-07-23 NOTE — ED Provider Notes (Signed)
CSN: [id number]     Arrival date & time 07/23/15  1540 History   First MD Initiated Contact with Patient 07/23/15 1555     Chief Complaint  Patient presents with  . Cast Problem     (Consider location/radiation/quality/duration/timing/severity/associated sxs/prior Treatment) HPI   [name] is a 82 y.o. female, with a history of A. fib, CVA, heart murmur, hypertension, fibromyalgia, and osteoarthritis, presenting to the ED with Complaint of right heel pain caused by pressure from the inferior portion of her long-leg cast. Patient states she originally had a cast that included the foot was placed at the beginning of March due to a patellar tendon repair that occurred on March 6. 3 days ago, patient was seen by her orthopedic surgeon, Dr. Charlann Boxer, and her cast was replaced with the present one that ends at the ankle. Patient rates her pain at 7 out of 10, burning, nonradiating. Patient has not taken any medication for her pain. Patient denies neuro deficits, bleeding, discharge, or any other complaints.    Past Medical History  Diagnosis Date  . Syncope   . Symptomatic bradycardia   . Chronic lower back pain   . Nonischemic cardiomyopathy (HCC)   . HTN (hypertension)   . HLD (hyperlipidemia)   . Atrial fibrillation (HCC)     tachy-brady syndrome with <1% recurrent PAF since pacemaker placement  . H/O: stroke   . Pulmonary embolism (HCC)     HISTORY OF, the pt. had a recurrent bilateral pulmonary emboli in 2005, on warfarin therapy and at which time she under went implantation of IVC filter  . Anemia   . Dementia   . Gastroesophageal reflux disease   . Aortic stenosis 10/13/2012    Low EF, low gradient with severe aortic stenosis confirmed by dobutamine stress echocardiogram s/p TAVR 12/2012  . PONV (postoperative nausea and vomiting)   . Osteoarthritis   . Neuropathy (HCC)   . Spinal stenosis   . Symptomatic bradycardia 2012    s/p Medtronic PPM  . Chronic combined systolic and  diastolic CHF (congestive heart failure) (HCC)   . Fibromyalgia   . Presence of permanent cardiac pacemaker   . Shortness of breath dyspnea     with exertion   . Pneumonia     hx of x 3   . Acute on chronic renal failure [facility])     sees Dr Allena Katz   . CKD (chronic kidney disease)   . Headache     hx of migraines   . On home oxygen therapy     patient uses at nite- 2L- has not used in > 6 months per patient  . Dysrhythmia   . Heart murmur   . Stroke (HCC)   . Asthma   . History of bronchitis   . Cataracts, bilateral   . Hard of hearing   . H/O dizziness   . Repeated falls   . Hypothyroidism   . History of kidney stones   . Urinary incontinence   . History of urinary tract infection   . H/O urinary frequency   . Urinary urgency   . History of blood transfusion   . Aortic regurgitation   . Sciatica   . Sleep apnea     uses oxygen at night and PRN- not used since > 6 months / DOES NOT USE  C-PAP   Past Surgical History  Procedure Laterality Date  . Pacemaker insertion      Medtronic  . Transcatheter aortic  valve replacement, transfemoral  12/21/2012    a. 29mm Edwards Sapien XT transcatheter heart valve placed via open left transfemoral approach b. Intra-op TEE: well-seated bioprosthetic aortic valve with mean gradient 2 mmHg, trivial AI, mild MR, EF 30-35%  . Transcatheter aortic valve replacement, transfemoral N/A 12/21/2012    Procedure: TRANSCATHETER AORTIC VALVE REPLACEMENT, TRANSFEMORAL;  Surgeon: Tonny Bollman, MD;  Location: [facility] OR;  Service: Open Heart Surgery;  Laterality: N/A;  . Intraoperative transesophageal echocardiogram N/A 12/21/2012    Procedure: INTRAOPERATIVE TRANSESOPHAGEAL ECHOCARDIOGRAM;  Surgeon: Tonny Bollman, MD;  Location: [facility] OR;  Service: Open Heart Surgery;  Laterality: N/A;  . Central venous catheter insertion Left 12/21/2012    Procedure: INSERTION CENTRAL LINE ADULT;  Surgeon: Tonny Bollman, MD;  Location: [facility] OR;  Service: Open Heart Surgery;   Laterality: Left;  . Cataract extraction    . Bunionectomy    . Appendectomy    . Abdominal hysterectomy    . Thyroidectomy, partial    . Back surgery    . Knee surgery Left   . Nose surgery      X 2  . Left and right heart catheterization with coronary angiogram N/A 10/18/2012    Procedure: LEFT AND RIGHT HEART CATHETERIZATION WITH CORONARY ANGIOGRAM;  Surgeon: Tonny Bollman, MD;  Location: [facility] CATH LAB;  Service: Cardiovascular;  Laterality: N/A;  . Nasal septum surgery    . Total knee arthroplasty Right 12/04/2014    Procedure: RIGHT TOTAL  KNEE ARTHROPLASTY;  Surgeon: Durene Romans, MD;  Location: [facility] ORS;  Service: Orthopedics;  Laterality: Right;  . Tonsillectomy    . Cardiac catheterization    . Shoulder surgery      bilat   . Elbow surgery      bilat   . Orif patella Right 03/08/2015    Procedure:  OPEN REDUCTION INTERNAL FIXATION RIGHT  PATELLA TENDON AVULSION;  Surgeon: Durene Romans, MD;  Location: [facility] ORS;  Service: Orthopedics;  Laterality: Right;  . Patellar tendon repair Right 06/25/2015    Procedure: RIGHT PATELLA TENDON REVISION/REPAIR;  Surgeon: Durene Romans, MD;  Location: [facility] ORS;  Service: Orthopedics;  Laterality: Right;   Family History  Problem Relation Age of Onset  . Ovarian cancer Mother     Deceased  . Epilepsy Father     Deceased   Social History  Substance Use Topics  . Smoking status: Never Smoker   . Smokeless tobacco: Never Used  . Alcohol Use: No   OB History    No data available     Review of Systems  Musculoskeletal: Positive for arthralgias (Right heel and ankle pain).  Neurological: Negative for weakness and numbness.      Allergies  Nubain; Codeine; and Darvocet  Home Medications   Prior to Admission medications   Medication Sig Start Date End Date Taking? Authorizing Provider  alendronate (FOSAMAX) 70 MG/75ML solution Take 70 mg by mouth every 7 (seven) days. Take with a full glass of water on an empty stomach on Saturday     Historical Provider, MD  amiodarone (PACERONE) 100 MG tablet Take 100 mg by mouth daily.    Historical Provider, MD  atorvastatin (LIPITOR) 10 MG tablet Take 10 mg by mouth daily.    Historical Provider, MD  BIOTIN 5000 PO Take 5,000 mcg by mouth every evening.     Historical Provider, MD  calcitRIOL (ROCALTROL) 0.25 MCG capsule Take 0.25 mcg by mouth every morning.     Historical Provider, MD  carvedilol (COREG) 12.5  MG tablet Take 1 tablet by mouth 2 (two) times daily. 07/20/14   Historical Provider, MD  cetirizine (ZYRTEC) 10 MG tablet Take 10 mg by mouth every morning.     Historical Provider, MD  Cholecalciferol (VITAMIN D-3) 1000 units CAPS Take 2 capsules by mouth. Take 2 tabs to = 2000 units PO QHS    Historical Provider, MD  colchicine 0.6 MG tablet Take 0.3 mg by mouth daily. Give 0.5 tablet to equal 0.3 mg by mouth daily    Historical Provider, MD  cycloSPORINE (RESTASIS) 0.05 % ophthalmic emulsion Place 1 drop into both eyes 2 (two) times daily as needed (dry eyes.).    Historical Provider, MD  docusate sodium (COLACE) 100 MG capsule Take 1 capsule (100 mg total) by mouth 2 (two) times daily. 06/27/15   Lanney Gins, PA-C  donepezil (ARICEPT) 10 MG tablet Take 10 mg by mouth at bedtime.     Historical Provider, MD  DULoxetine (CYMBALTA) 60 MG capsule Take 60 mg by mouth every evening.     Historical Provider, MD  ferrous sulfate 325 (65 FE) MG tablet Take 325 mg by mouth 3 (three) times daily after meals.    Historical Provider, MD  hydrALAZINE (APRESOLINE) 10 MG tablet Take 10 mg by mouth 3 (three) times daily.  11/22/12   Historical Provider, MD  HYDROcodone-acetaminophen (NORCO) 7.5-325 MG tablet Take 1-2 tablets by mouth every 4 (four) hours as needed for moderate pain. 06/27/15   Lanney Gins, PA-C  ipratropium-albuterol (DUONEB) 0.5-2.5 (3) MG/3ML SOLN Take 3 mLs by nebulization daily as needed.     Historical Provider, MD  levothyroxine (SYNTHROID, LEVOTHROID) 112 MCG tablet Take 112  mcg by mouth daily.     Historical Provider, MD  Linaclotide Karlene Einstein) 145 MCG CAPS capsule Take 145 mcg by mouth every other day. Alternates with the 290 mg tablet in the am.    Historical Provider, MD  Linaclotide 290 MCG CAPS Take 290 mcg by mouth every other day. Alternates with the 145 mg tablets in the morning.    Historical Provider, MD  Liniments [facility] ARTHRITIS PAIN RELIEF EX) Apply 1 patch topically 2 (two) times daily. Monitor for rash or skin irritation    Historical Provider, MD  memantine (NAMENDA) 10 MG tablet Take 10 mg by mouth 2 (two) times daily.     Historical Provider, MD  Multiple Vitamin (MULTIVITAMIN) tablet Take 0.5 tablets by mouth daily. Give 1/2 tablet PO QAM for supplement    Historical Provider, MD  nitroGLYCERIN (NITROSTAT) 0.4 MG SL tablet Place 0.4 mg under the tongue every 5 (five) minutes as needed for chest pain (MAX 3 TABLETS).     Historical Provider, MD  omeprazole (PRILOSEC) 20 MG capsule Take 20 mg by mouth daily.    Historical Provider, MD  polyethylene glycol (MIRALAX / GLYCOLAX) packet Take 17 g by mouth 2 (two) times daily. 12/07/14   Lanney Gins, PA-C  pregabalin (LYRICA) 75 MG capsule Take 75 mg by mouth 2 (two) times daily.     Historical Provider, MD  simethicone (MYLICON) 80 MG chewable tablet Chew 80 mg by mouth 2 (two) times daily.    Historical Provider, MD  tiZANidine (ZANAFLEX) 4 MG tablet Take 1 tablet (4 mg total) by mouth every 6 (six) hours as needed for muscle spasms. 06/27/15   Lanney Gins, PA-C  torsemide (DEMADEX) 20 MG tablet Take 20 mg by mouth 2 (two) times daily. PEr Dr Allena Katz - patient to not take on 8/14  nor am of 8/15    Historical Provider, MD  Turmeric 500 MG CAPS Take 1 capsule by mouth 2 (two) times daily.    Historical Provider, MD  ULORIC 40 MG tablet Take 40 mg by mouth every evening.  12/02/12   Historical Provider, MD  vitamin B-12 (CYANOCOBALAMIN) 1000 MCG tablet Take 1,000 mcg by mouth 2 (two) times a week. Tuesday  and Friday    Historical Provider, MD  warfarin (COUMADIN) 3 MG tablet Take 3 mg by mouth daily. Take 1/2 tablet to = 1.5 mg PO QD    Historical Provider, MD   BP 136/58 mmHg  Pulse 63  Temp(Src) 98.2 F (36.8 C) (Oral)  Resp 18  SpO2 94% Physical Exam  Constitutional: She is oriented to person, place, and time. She appears well-developed and well-nourished. No distress.  HENT:  Head: Normocephalic and atraumatic.  Eyes: Conjunctivae are normal.  Cardiovascular: Normal rate and regular rhythm.   Pulmonary/Chest: Effort normal.  Musculoskeletal:  Patient is noted to have a cast that extends past the malleoli. Stage I pressure ulcer noted on the heel at the base of the cast. No open wound. Full range of motion in the right foot and toes. Circulation intact distally.  Neurological: She is alert and oriented to person, place, and time.  No sensory deficit. Strength 5 out of 5.  Skin: Skin is warm and dry. She is not diaphoretic.  Psychiatric: She has a normal mood and affect. Her behavior is normal.  Nursing note and vitals reviewed.   ED Course  Procedures (including critical care time)   MDM   Final diagnoses:  Pressure ulcer of ankle, right, stage I    Delfin Gant presents with right heel pain due to pressure from a cast.  Findings and plan of care discussed with Melene Plan, DO. Dr. Adela Lank personally evaluated and examined this patient.  This patient's cast is creating an early stage pressure ulcer, which will have to be remedied. Distal portion of the cast was cut away by the Ortho Tech and a piece of padding was placed. Patient to follow up with Dr. Charlann Boxer tomorrow for reevaluation. Patient was given instructions for home care and follow-up. Patient voiced understanding of these instructions and is comfortable with discharge. Pt sent back to the rehab facility with instructions for follow-up.  Filed Vitals:   07/23/15 1547 07/23/15 1606  BP:  136/58  Pulse:  63  Temp:   98.2 F (36.8 C)  TempSrc:  Oral  Resp:  18  SpO2: 98% 94%       Anselm Pancoast, PA-C 07/23/15 1718  Melene Plan, DO 07/23/15 1736

## 2015-07-23 NOTE — ED Notes (Signed)
Ortho tech at bedside 

## 2015-07-23 NOTE — Progress Notes (Signed)
Patient ID: [name], female   DOB: 09-30-1932, 82 y.o.   MRN: [id number] Subjective:     Indication: atrial fibrillation Bleeding signs/symptoms: None Thromboembolic signs/symptoms: None  Missed Coumadin doses: This week - 5 due to supratherapeutic INR Medication changes: no Dietary changes: no Bacterial/viral infection: no Other concerns: no  The following portions of the patient's history were reviewed and updated as appropriate: allergies, current medications, past family history, past medical history, past social history, past surgical history and problem list.  Review of Systems A comprehensive review of systems was negative.   Objective:    INR Today: 1.7 Current dose: Coumadin 2 mg daily     Assessment:    Subtherapeutic INR for goal of 2-3   Plan:    1. New dose: start Coumadin 1.5 mg daily   2. Next INR: 07/27/15

## 2015-07-23 NOTE — ED Notes (Signed)
Bed: [facility] Expected date: 07/23/15 Expected time:  Means of arrival:  Comments: Closed

## 2015-07-23 NOTE — ED Notes (Signed)
Per EMS, has irritation to back of the right heel from cast on leg. PMS intact in distal casted extremity

## 2015-07-24 DIAGNOSIS — R5381 Other malaise: Secondary | ICD-10-CM | POA: Diagnosis not present

## 2015-07-24 DIAGNOSIS — Z4789 Encounter for other orthopedic aftercare: Secondary | ICD-10-CM | POA: Diagnosis not present

## 2015-07-24 DIAGNOSIS — R262 Difficulty in walking, not elsewhere classified: Secondary | ICD-10-CM | POA: Diagnosis not present

## 2015-07-24 DIAGNOSIS — S86891D Other injury of other muscle(s) and tendon(s) at lower leg level, right leg, subsequent encounter: Secondary | ICD-10-CM | POA: Diagnosis not present

## 2015-07-25 DIAGNOSIS — S86891D Other injury of other muscle(s) and tendon(s) at lower leg level, right leg, subsequent encounter: Secondary | ICD-10-CM | POA: Diagnosis not present

## 2015-07-25 DIAGNOSIS — R262 Difficulty in walking, not elsewhere classified: Secondary | ICD-10-CM | POA: Diagnosis not present

## 2015-07-25 DIAGNOSIS — Z4789 Encounter for other orthopedic aftercare: Secondary | ICD-10-CM | POA: Diagnosis not present

## 2015-07-25 DIAGNOSIS — R5381 Other malaise: Secondary | ICD-10-CM | POA: Diagnosis not present

## 2015-07-27 DIAGNOSIS — R262 Difficulty in walking, not elsewhere classified: Secondary | ICD-10-CM | POA: Diagnosis not present

## 2015-07-27 DIAGNOSIS — S86891D Other injury of other muscle(s) and tendon(s) at lower leg level, right leg, subsequent encounter: Secondary | ICD-10-CM | POA: Diagnosis not present

## 2015-07-27 DIAGNOSIS — Z4789 Encounter for other orthopedic aftercare: Secondary | ICD-10-CM | POA: Diagnosis not present

## 2015-07-27 DIAGNOSIS — R5381 Other malaise: Secondary | ICD-10-CM | POA: Diagnosis not present

## 2015-07-31 ENCOUNTER — Non-Acute Institutional Stay (SKILLED_NURSING_FACILITY): Payer: Medicare Other | Admitting: Adult Health

## 2015-07-31 ENCOUNTER — Encounter: Payer: Self-pay | Admitting: Adult Health

## 2015-07-31 DIAGNOSIS — K219 Gastro-esophageal reflux disease without esophagitis: Secondary | ICD-10-CM | POA: Diagnosis not present

## 2015-07-31 DIAGNOSIS — G629 Polyneuropathy, unspecified: Secondary | ICD-10-CM

## 2015-07-31 DIAGNOSIS — F0390 Unspecified dementia without behavioral disturbance: Secondary | ICD-10-CM | POA: Diagnosis not present

## 2015-07-31 DIAGNOSIS — N183 Chronic kidney disease, stage 3 unspecified: Secondary | ICD-10-CM

## 2015-07-31 DIAGNOSIS — S86811S Strain of other muscle(s) and tendon(s) at lower leg level, right leg, sequela: Secondary | ICD-10-CM | POA: Diagnosis not present

## 2015-07-31 DIAGNOSIS — I25119 Atherosclerotic heart disease of native coronary artery with unspecified angina pectoris: Secondary | ICD-10-CM | POA: Diagnosis not present

## 2015-07-31 DIAGNOSIS — Z7901 Long term (current) use of anticoagulants: Secondary | ICD-10-CM

## 2015-07-31 DIAGNOSIS — D62 Acute posthemorrhagic anemia: Secondary | ICD-10-CM

## 2015-07-31 DIAGNOSIS — I480 Paroxysmal atrial fibrillation: Secondary | ICD-10-CM | POA: Diagnosis not present

## 2015-07-31 DIAGNOSIS — I5032 Chronic diastolic (congestive) heart failure: Secondary | ICD-10-CM | POA: Diagnosis not present

## 2015-07-31 DIAGNOSIS — K5900 Constipation, unspecified: Secondary | ICD-10-CM

## 2015-07-31 DIAGNOSIS — J302 Other seasonal allergic rhinitis: Secondary | ICD-10-CM

## 2015-07-31 DIAGNOSIS — K5909 Other constipation: Secondary | ICD-10-CM

## 2015-07-31 DIAGNOSIS — M810 Age-related osteoporosis without current pathological fracture: Secondary | ICD-10-CM

## 2015-07-31 DIAGNOSIS — I10 Essential (primary) hypertension: Secondary | ICD-10-CM | POA: Diagnosis not present

## 2015-07-31 NOTE — Progress Notes (Addendum)
Patient ID: [name], female   DOB: 1932-12-21, 81 y.o.   MRN: [id number]    DATE:  07/31/2015   MRN:  [id number]  BIRTHDAY: December 29, 1932  Facility:  Nursing Home Location:  [facility]  Nursing Home Room Number: 1201-P  LEVEL OF CARE:  SNF [phone])  Contact Information    Name Relation Home Work Epes [phone]  [phone]   Gray,Sharon Daughter [phone]  [phone]       Code Status History    Date Active Date Inactive Code Status Order ID Comments User Context   03/08/2015  8:25 PM 03/12/2015  5:09 PM Full Code [id number]  Lanney Gins, PA-C Inpatient   12/15/2014 12:39 AM 12/15/2014  6:36 PM Full Code [id number]  Yevonne Pax, MD Inpatient   12/04/2014  4:00 PM 12/07/2014  5:13 PM Full Code [id number]  Lanney Gins, PA-C Inpatient   12/21/2012  3:56 PM 12/28/2012  7:34 PM DNR [id number]  Tonny Bollman, MD Inpatient      Chief Complaint  Patient presents with  . Medical management of chronic illnesses    HISTORY OF PRESENT ILLNESS:  This is an 82 year old female who is supposedly  for discharge home but patient and family has changed their mind and would like to stay in the facility to get some more rehabilitation.  She has been re-admitted to [facility] on 06/29/15 from [facility] with right patella tendon rupture. She underwent patella tendon repair by Dr. Charlann Boxer. She has a cast on RLE.   PAST MEDICAL HISTORY:  Past Medical History  Diagnosis Date  . Syncope   . Symptomatic bradycardia   . Chronic lower back pain   . Nonischemic cardiomyopathy (HCC)   . HTN (hypertension)   . HLD (hyperlipidemia)   . Atrial fibrillation (HCC)     tachy-brady syndrome with <1% recurrent PAF since pacemaker placement  . H/O: stroke   . Pulmonary embolism (HCC)     HISTORY OF, the pt. had a recurrent bilateral pulmonary emboli in 2005, on warfarin therapy and at which time she under went implantation of IVC filter  . Anemia    . Dementia   . Gastroesophageal reflux disease   . Aortic stenosis 10/13/2012    Low EF, low gradient with severe aortic stenosis confirmed by dobutamine stress echocardiogram s/p TAVR 12/2012  . PONV (postoperative nausea and vomiting)   . Osteoarthritis   . Neuropathy (HCC)   . Spinal stenosis   . Symptomatic bradycardia 2012    s/p [employer] PPM  . Chronic combined systolic and diastolic CHF (congestive heart failure) (HCC)   . Fibromyalgia   . Presence of permanent cardiac pacemaker   . Shortness of breath dyspnea     with exertion   . Pneumonia     hx of x 3   . Acute on chronic renal failure [facility])     sees Dr Allena Katz   . CKD (chronic kidney disease)   . Headache     hx of migraines   . On home oxygen therapy     patient uses at nite- 2L- has not used in > 6 months per patient  . Dysrhythmia   . Heart murmur   . Stroke (HCC)   . Asthma   . History of bronchitis   . Cataracts, bilateral   . Hard of hearing   . H/O dizziness   . Repeated falls   . Hypothyroidism   . History  of kidney stones   . Urinary incontinence   . History of urinary tract infection   . H/O urinary frequency   . Urinary urgency   . History of blood transfusion   . Aortic regurgitation   . Sciatica   . Sleep apnea     uses oxygen at night and PRN- not used since > 6 months / DOES NOT USE  C-PAP     CURRENT MEDICATIONS: Reviewed  Patient's Medications  New Prescriptions   No medications on file  Previous Medications   ALENDRONATE (FOSAMAX) 70 MG/75ML SOLUTION    Take 70 mg by mouth every 7 (seven) days. Take with a full glass of water on an empty stomach on Saturday   AMIODARONE (PACERONE) 100 MG TABLET    Take 100 mg by mouth daily.   ATORVASTATIN (LIPITOR) 10 MG TABLET    Take 10 mg by mouth daily.   BIOTIN 5000 PO    Take 5,000 mcg by mouth every evening.    CALCITRIOL (ROCALTROL) 0.25 MCG CAPSULE    Take 0.25 mcg by mouth every morning.    CARVEDILOL (COREG) 12.5 MG TABLET    Take 1  tablet by mouth 2 (two) times daily.   CETIRIZINE (ZYRTEC) 10 MG TABLET    Take 10 mg by mouth every morning.    CHOLECALCIFEROL (VITAMIN D-3) 1000 UNITS CAPS    Take 2 capsules by mouth. Take 2 tabs to = 2000 units PO QHS   COLCHICINE 0.6 MG TABLET    Take 0.3 mg by mouth daily. Give 0.5 tablet to equal 0.3 mg by mouth daily   CYCLOSPORINE (RESTASIS) 0.05 % OPHTHALMIC EMULSION    Place 1 drop into both eyes 2 (two) times daily as needed (dry eyes.).   DOCUSATE SODIUM (COLACE) 100 MG CAPSULE    Take 1 capsule (100 mg total) by mouth 2 (two) times daily.   DONEPEZIL (ARICEPT) 10 MG TABLET    Take 10 mg by mouth at bedtime.    DULOXETINE (CYMBALTA) 60 MG CAPSULE    Take 60 mg by mouth every evening.    FERROUS SULFATE 325 (65 FE) MG TABLET    Take 325 mg by mouth 3 (three) times daily after meals.   GUAIFENESIN-DEXTROMETHORPHAN (ROBITUSSIN DM) 100-10 MG/5ML SYRUP    Take 10 mLs by mouth every 6 (six) hours. Take for 2 days per S/O   HYDRALAZINE (APRESOLINE) 10 MG TABLET    Take 10 mg by mouth 3 (three) times daily.    HYDROCODONE-ACETAMINOPHEN (NORCO) 7.5-325 MG TABLET    Take 1-2 tablets by mouth every 4 (four) hours as needed for moderate pain.   IPRATROPIUM-ALBUTEROL (DUONEB) 0.5-2.5 (3) MG/3ML SOLN    Take 3 mLs by nebulization daily as needed.    LEVOTHYROXINE (SYNTHROID, LEVOTHROID) 112 MCG TABLET    Take 112 mcg by mouth daily.    LINACLOTIDE (LINZESS) 145 MCG CAPS CAPSULE    Take 145 mcg by mouth every other day. Alternates with the 290 mg tablet in the am.   LINACLOTIDE 290 MCG CAPS    Take 290 mcg by mouth every other day. Alternates with the 145 mg tablets in the morning.   LINIMENTS (SALONPAS ARTHRITIS PAIN RELIEF EX)    Apply 1 patch topically 2 (two) times daily. Monitor for rash or skin irritation   MEMANTINE (NAMENDA) 10 MG TABLET    Take 10 mg by mouth 2 (two) times daily.    MULTIPLE VITAMIN (MULTIVITAMIN) TABLET  Take 0.5 tablets by mouth daily. Give 1/2 tablet PO QAM for  supplement   NITROGLYCERIN (NITROSTAT) 0.4 MG SL TABLET    Place 0.4 mg under the tongue every 5 (five) minutes as needed for chest pain (MAX 3 TABLETS).    OMEPRAZOLE (PRILOSEC) 20 MG CAPSULE    Take 20 mg by mouth daily.   POLYETHYLENE GLYCOL (MIRALAX / GLYCOLAX) PACKET    Take 17 g by mouth 2 (two) times daily.   PREGABALIN (LYRICA) 75 MG CAPSULE    Take 75 mg by mouth 2 (two) times daily.    PROTEIN (PROCEL) POWD    Take 2 scoop by mouth 2 (two) times daily.   SIMETHICONE (MYLICON) 80 MG CHEWABLE TABLET    Chew 80 mg by mouth 2 (two) times daily.   TIZANIDINE (ZANAFLEX) 4 MG TABLET    Take 1 tablet (4 mg total) by mouth every 6 (six) hours as needed for muscle spasms.   TORSEMIDE (DEMADEX) 20 MG TABLET    Take 20 mg by mouth 2 (two) times daily. PEr Dr Allena Katz - patient to not take on 8/14 nor am of 8/15   TURMERIC 500 MG CAPS    Take 1 capsule by mouth 2 (two) times daily.   ULORIC 40 MG TABLET    Take 40 mg by mouth every evening.    VITAMIN B-12 (CYANOCOBALAMIN) 1000 MCG TABLET    Take 1,000 mcg by mouth 2 (two) times a week. Tuesday and Friday   WARFARIN (COUMADIN) 2.5 MG TABLET    Take 2.5 mg by mouth daily.  Modified Medications   No medications on file  Discontinued Medications   WARFARIN (COUMADIN) 3 MG TABLET    Take 3 mg by mouth daily. Take 1/2 tablet to = 1.5 mg PO QD     Allergies  Allergen Reactions  . Nubain [Nalbuphine Hcl] Hives    Went into cardiac arrest   . Codeine Hives and Nausea Only  . Darvocet [Propoxyphene N-Acetaminophen] Nausea Only     REVIEW OF SYSTEMS:  GENERAL: no change in appetite, no fatigue, no weight changes, no fever, chills or weakness EYES: Denies change in vision, dry eyes, eye pain, itching or discharge EARS: Denies change in hearing, ringing in ears, or earache NOSE: Denies nasal congestion or epistaxis MOUTH and THROAT: Denies oral discomfort, gingival pain or bleeding, pain from teeth or hoarseness   RESPIRATORY: no cough, SOB, DOE,  wheezing, hemoptysis CARDIAC: no chest pain, edema or palpitations GI: no abdominal pain, diarrhea, constipation, heart burn, nausea or vomiting GU: Denies dysuria, frequency, hematuria, incontinence, or discharge PSYCHIATRIC: Denies feeling of depression or anxiety. No report of hallucinations, insomnia, paranoia, or agitation   PHYSICAL EXAMINATION  GENERAL APPEARANCE: Well nourished. In no acute distress. Obese SKIN:  Skin is warm and dry. Right heel is slightly red but intact skin integrity HEAD: Normal in size and contour. No evidence of trauma EYES: Lids open and close normally. No blepharitis, entropion or ectropion. PERRL. Conjunctivae are clear and sclerae are white. Lenses are without opacity EARS: Pinnae are normal. Patient hears normal voice tunes of the examiner MOUTH and THROAT: Lips are without lesions. Oral mucosa is moist and without lesions. Tongue is normal in shape, size, and color and without lesions NECK: supple, trachea midline, no neck masses, no thyroid tenderness, no thyromegaly LYMPHATICS: no LAN in the neck, no supraclavicular LAN RESPIRATORY: breathing is even & unlabored, BS CTAB CARDIAC: Irregularly irregular, no murmur,no extra heart sounds, no  edema GI: abdomen soft, normal BS, no masses, no tenderness, no hepatomegaly, no splenomegaly MUSCULOSKELETAL: Able to move X 4 extremities; RLE is limited due to cast CIRCULATION: pedal pulses are 2+. There is no edema of the ankles and feet NEUROLOGICAL: There is no tremor. Speech is clear PSYCHIATRIC: Alert and oriented X 3. Affect and behavior are appropriate  LABS/RADIOLOGY: Labs reviewed: Basic Metabolic Panel:  Recent Labs  28/41/32 0403 06/28/15 1221 06/29/15 06/29/15 0334 07/03/15  NA 142 141 136* 136 146  K 4.3 4.3  --  4.0 3.8  CL 105 106  --  102  --   CO2 28 27  --  26  --   GLUCOSE 104* 106*  --  106*  --   BUN 61* 76* 83* 83* 73*  CREATININE 3.33* 3.37* 2.9* 2.93* 2.2*  CALCIUM 8.8* 9.0   --  8.6*  --    Liver Function Tests:  Recent Labs  12/15/14 0127 07/03/15  AST 48* 15  ALT 32 4*  ALKPHOS 68 70  BILITOT 0.7  --   PROT 5.7*  --   ALBUMIN 3.2*  --    CBC:  Recent Labs  12/14/14 2000  06/27/15 0403 06/28/15 1221 06/29/15 06/29/15 0334 07/03/15  WBC 8.4  < > 7.1 7.2 6.4 6.4 5.7  NEUTROABS 5.2  --   --   --   --   --  3  HGB 8.3*  < > 9.4* 10.1*  --  9.4* 9.1*  HCT 25.7*  < > 30.2* 31.6*  --  28.1* 28*  MCV 95.9  < > 93.5 92.9  --  87.5  --   PLT 269  < > 209 208  --  200 229  < > = values in this interval not displayed.  CBG:  Recent Labs  12/07/14 1211 12/15/14 0737  GLUCAP 144* 104*     ASSESSMENT/PLAN:   Right patella tendon rupture S/P repair - continue rehabilitation; has cast on RLE that needs to be pulled up when sitting/lying down since it slides a bit; RLE  WBAT; follow-up with Dr. Charlann Boxer, orthopedics; fall precautions; continue Coumadin for DVT prophylaxis; Norco 7.5/325mg  1-2 tabs Q 4 hours PRN for pain; Tizanidine 4 mg 1 tab PO Q 6 hours PRN for muscle spasm  Blood loss anemia - hgb 9.1; continue FeSO4 325 mg 1 tab PO TID  Atrial fibrillation - rate controlled. continue coreg 12.5 mg bid, amiodarone 100 mg daily and Coumadin  Long-term use of anticoagulant - INR 1.8; increase Coumadin 2.5 mg PO Q D; INR check on 08/02/15  Osteoporosis - Continue weekly fosamax and calcitriol for now. Fall precautions  Allergic rhinitis - continue cetirizine 10 mg daily   GERD - stable, continue prilosec 20 mg daily  Hypertension with renal disease - continue hydralazine 10 mg tid with coreg 12.5 mg bid  CKD stage 3 - creatinine 2.15, stable  Dementia - Continue donepezil and memantine   Chronic constipation - continue Colace 100 mg bid, Linzess alternating dosing and miralax bid.  CHF - no SOB; torsemide, coreg and hydralazine  Neuropathy - continue Lyrica 75 mg bid and cymbalta 60 mg daily  CAD - stable; continue coreg, hydralazine, prn  NTG, statin      Goals of care:  Short-term rehabilitation    [facility], NP [facility] [phone]

## 2015-08-02 DIAGNOSIS — I509 Heart failure, unspecified: Secondary | ICD-10-CM | POA: Diagnosis not present

## 2015-08-06 ENCOUNTER — Non-Acute Institutional Stay (SKILLED_NURSING_FACILITY): Payer: Medicare Other | Admitting: Adult Health

## 2015-08-06 ENCOUNTER — Encounter: Payer: Self-pay | Admitting: Adult Health

## 2015-08-06 DIAGNOSIS — Z4789 Encounter for other orthopedic aftercare: Secondary | ICD-10-CM | POA: Diagnosis not present

## 2015-08-06 DIAGNOSIS — R5381 Other malaise: Secondary | ICD-10-CM | POA: Diagnosis not present

## 2015-08-06 DIAGNOSIS — Z7901 Long term (current) use of anticoagulants: Secondary | ICD-10-CM | POA: Diagnosis not present

## 2015-08-06 DIAGNOSIS — S86891D Other injury of other muscle(s) and tendon(s) at lower leg level, right leg, subsequent encounter: Secondary | ICD-10-CM | POA: Diagnosis not present

## 2015-08-06 DIAGNOSIS — I480 Paroxysmal atrial fibrillation: Secondary | ICD-10-CM

## 2015-08-06 DIAGNOSIS — R262 Difficulty in walking, not elsewhere classified: Secondary | ICD-10-CM | POA: Diagnosis not present

## 2015-08-06 NOTE — Progress Notes (Signed)
Patient ID: [name], female   DOB: 15-May-1932, 82 y.o.   MRN: [id number] Subjective:     Indication: atrial fibrillation Bleeding signs/symptoms: None Thromboembolic signs/symptoms: None  Missed Coumadin doses: None Medication changes: no Dietary changes: no Bacterial/viral infection: no Other concerns: no  The following portions of the patient's history were reviewed and updated as appropriate: allergies, current medications, past family history, past medical history, past social history, past surgical history and problem list.  Review of Systems A comprehensive review of systems was negative.   Objective:    INR Today: 1.5 Current dose: Coumadin 2.5 mg daily     Assessment:    Subtherapeutic INR for goal of 2-3   Plan:    1. New dose: Give Coumadin 5 mg PO X 1 today then Coumadin 3 mg daily   2. Next INR: 08/08/15

## 2015-08-08 DIAGNOSIS — S86891D Other injury of other muscle(s) and tendon(s) at lower leg level, right leg, subsequent encounter: Secondary | ICD-10-CM | POA: Diagnosis not present

## 2015-08-08 DIAGNOSIS — R5381 Other malaise: Secondary | ICD-10-CM | POA: Diagnosis not present

## 2015-08-08 DIAGNOSIS — R262 Difficulty in walking, not elsewhere classified: Secondary | ICD-10-CM | POA: Diagnosis not present

## 2015-08-08 DIAGNOSIS — Z4789 Encounter for other orthopedic aftercare: Secondary | ICD-10-CM | POA: Diagnosis not present

## 2015-08-10 DIAGNOSIS — R5381 Other malaise: Secondary | ICD-10-CM | POA: Diagnosis not present

## 2015-08-10 DIAGNOSIS — Z4789 Encounter for other orthopedic aftercare: Secondary | ICD-10-CM | POA: Diagnosis not present

## 2015-08-10 DIAGNOSIS — R262 Difficulty in walking, not elsewhere classified: Secondary | ICD-10-CM | POA: Diagnosis not present

## 2015-08-10 DIAGNOSIS — S86811D Strain of other muscle(s) and tendon(s) at lower leg level, right leg, subsequent encounter: Secondary | ICD-10-CM | POA: Diagnosis not present

## 2015-08-10 DIAGNOSIS — S86891D Other injury of other muscle(s) and tendon(s) at lower leg level, right leg, subsequent encounter: Secondary | ICD-10-CM | POA: Diagnosis not present

## 2015-08-11 DIAGNOSIS — R5381 Other malaise: Secondary | ICD-10-CM | POA: Diagnosis not present

## 2015-08-11 DIAGNOSIS — I480 Paroxysmal atrial fibrillation: Secondary | ICD-10-CM | POA: Diagnosis not present

## 2015-08-11 DIAGNOSIS — I25119 Atherosclerotic heart disease of native coronary artery with unspecified angina pectoris: Secondary | ICD-10-CM | POA: Diagnosis not present

## 2015-08-11 DIAGNOSIS — R55 Syncope and collapse: Secondary | ICD-10-CM | POA: Diagnosis not present

## 2015-08-11 DIAGNOSIS — M6281 Muscle weakness (generalized): Secondary | ICD-10-CM | POA: Diagnosis not present

## 2015-08-11 DIAGNOSIS — S86811S Strain of other muscle(s) and tendon(s) at lower leg level, right leg, sequela: Secondary | ICD-10-CM | POA: Diagnosis not present

## 2015-08-11 DIAGNOSIS — S86891D Other injury of other muscle(s) and tendon(s) at lower leg level, right leg, subsequent encounter: Secondary | ICD-10-CM | POA: Diagnosis not present

## 2015-08-11 DIAGNOSIS — R262 Difficulty in walking, not elsewhere classified: Secondary | ICD-10-CM | POA: Diagnosis not present

## 2015-08-11 DIAGNOSIS — M66269 Spontaneous rupture of extensor tendons, unspecified lower leg: Secondary | ICD-10-CM | POA: Diagnosis not present

## 2015-08-11 DIAGNOSIS — Z9181 History of falling: Secondary | ICD-10-CM | POA: Diagnosis not present

## 2015-08-11 DIAGNOSIS — K219 Gastro-esophageal reflux disease without esophagitis: Secondary | ICD-10-CM | POA: Diagnosis not present

## 2015-08-11 DIAGNOSIS — I5032 Chronic diastolic (congestive) heart failure: Secondary | ICD-10-CM | POA: Diagnosis not present

## 2015-08-11 DIAGNOSIS — Z4789 Encounter for other orthopedic aftercare: Secondary | ICD-10-CM | POA: Diagnosis not present

## 2015-08-11 DIAGNOSIS — G8929 Other chronic pain: Secondary | ICD-10-CM | POA: Diagnosis not present

## 2015-08-11 DIAGNOSIS — Z7901 Long term (current) use of anticoagulants: Secondary | ICD-10-CM | POA: Diagnosis not present

## 2015-08-11 DIAGNOSIS — R488 Other symbolic dysfunctions: Secondary | ICD-10-CM | POA: Diagnosis not present

## 2015-08-11 DIAGNOSIS — D508 Other iron deficiency anemias: Secondary | ICD-10-CM | POA: Diagnosis not present

## 2015-08-13 DIAGNOSIS — R5381 Other malaise: Secondary | ICD-10-CM | POA: Diagnosis not present

## 2015-08-13 DIAGNOSIS — Z4789 Encounter for other orthopedic aftercare: Secondary | ICD-10-CM | POA: Diagnosis not present

## 2015-08-13 DIAGNOSIS — R262 Difficulty in walking, not elsewhere classified: Secondary | ICD-10-CM | POA: Diagnosis not present

## 2015-08-13 DIAGNOSIS — S86891D Other injury of other muscle(s) and tendon(s) at lower leg level, right leg, subsequent encounter: Secondary | ICD-10-CM | POA: Diagnosis not present

## 2015-08-15 DIAGNOSIS — S86891D Other injury of other muscle(s) and tendon(s) at lower leg level, right leg, subsequent encounter: Secondary | ICD-10-CM | POA: Diagnosis not present

## 2015-08-15 DIAGNOSIS — Z4789 Encounter for other orthopedic aftercare: Secondary | ICD-10-CM | POA: Diagnosis not present

## 2015-08-15 DIAGNOSIS — R5381 Other malaise: Secondary | ICD-10-CM | POA: Diagnosis not present

## 2015-08-15 DIAGNOSIS — R262 Difficulty in walking, not elsewhere classified: Secondary | ICD-10-CM | POA: Diagnosis not present

## 2015-08-17 DIAGNOSIS — Z4789 Encounter for other orthopedic aftercare: Secondary | ICD-10-CM | POA: Diagnosis not present

## 2015-08-17 DIAGNOSIS — R5381 Other malaise: Secondary | ICD-10-CM | POA: Diagnosis not present

## 2015-08-17 DIAGNOSIS — S86891D Other injury of other muscle(s) and tendon(s) at lower leg level, right leg, subsequent encounter: Secondary | ICD-10-CM | POA: Diagnosis not present

## 2015-08-17 DIAGNOSIS — R262 Difficulty in walking, not elsewhere classified: Secondary | ICD-10-CM | POA: Diagnosis not present

## 2015-08-20 DIAGNOSIS — S86891D Other injury of other muscle(s) and tendon(s) at lower leg level, right leg, subsequent encounter: Secondary | ICD-10-CM | POA: Diagnosis not present

## 2015-08-20 DIAGNOSIS — R5381 Other malaise: Secondary | ICD-10-CM | POA: Diagnosis not present

## 2015-08-20 DIAGNOSIS — G8929 Other chronic pain: Secondary | ICD-10-CM | POA: Diagnosis not present

## 2015-08-20 DIAGNOSIS — R262 Difficulty in walking, not elsewhere classified: Secondary | ICD-10-CM | POA: Diagnosis not present

## 2015-08-20 DIAGNOSIS — Z4789 Encounter for other orthopedic aftercare: Secondary | ICD-10-CM | POA: Diagnosis not present

## 2015-08-22 DIAGNOSIS — S86891D Other injury of other muscle(s) and tendon(s) at lower leg level, right leg, subsequent encounter: Secondary | ICD-10-CM | POA: Diagnosis not present

## 2015-08-22 DIAGNOSIS — R262 Difficulty in walking, not elsewhere classified: Secondary | ICD-10-CM | POA: Diagnosis not present

## 2015-08-22 DIAGNOSIS — G8929 Other chronic pain: Secondary | ICD-10-CM | POA: Diagnosis not present

## 2015-08-22 DIAGNOSIS — R5381 Other malaise: Secondary | ICD-10-CM | POA: Diagnosis not present

## 2015-08-22 DIAGNOSIS — Z4789 Encounter for other orthopedic aftercare: Secondary | ICD-10-CM | POA: Diagnosis not present

## 2015-08-22 NOTE — Addendum Note (Signed)
Addended by: Kenard Gower C on: 08/22/2015 12:48 PM   Modules accepted: Level of Service

## 2015-08-27 ENCOUNTER — Non-Acute Institutional Stay (SKILLED_NURSING_FACILITY): Payer: Medicare Other | Admitting: Adult Health

## 2015-08-27 ENCOUNTER — Encounter: Payer: Self-pay | Admitting: Adult Health

## 2015-08-27 DIAGNOSIS — I10 Essential (primary) hypertension: Secondary | ICD-10-CM | POA: Diagnosis not present

## 2015-08-27 DIAGNOSIS — S86811S Strain of other muscle(s) and tendon(s) at lower leg level, right leg, sequela: Secondary | ICD-10-CM

## 2015-08-27 DIAGNOSIS — I25119 Atherosclerotic heart disease of native coronary artery with unspecified angina pectoris: Secondary | ICD-10-CM

## 2015-08-27 DIAGNOSIS — G629 Polyneuropathy, unspecified: Secondary | ICD-10-CM

## 2015-08-27 DIAGNOSIS — R5381 Other malaise: Secondary | ICD-10-CM | POA: Diagnosis not present

## 2015-08-27 DIAGNOSIS — J309 Allergic rhinitis, unspecified: Secondary | ICD-10-CM | POA: Diagnosis not present

## 2015-08-27 DIAGNOSIS — R262 Difficulty in walking, not elsewhere classified: Secondary | ICD-10-CM | POA: Diagnosis not present

## 2015-08-27 DIAGNOSIS — K5900 Constipation, unspecified: Secondary | ICD-10-CM

## 2015-08-27 DIAGNOSIS — K219 Gastro-esophageal reflux disease without esophagitis: Secondary | ICD-10-CM | POA: Diagnosis not present

## 2015-08-27 DIAGNOSIS — I480 Paroxysmal atrial fibrillation: Secondary | ICD-10-CM

## 2015-08-27 DIAGNOSIS — N183 Chronic kidney disease, stage 3 unspecified: Secondary | ICD-10-CM

## 2015-08-27 DIAGNOSIS — F0390 Unspecified dementia without behavioral disturbance: Secondary | ICD-10-CM | POA: Diagnosis not present

## 2015-08-27 DIAGNOSIS — M810 Age-related osteoporosis without current pathological fracture: Secondary | ICD-10-CM

## 2015-08-27 DIAGNOSIS — K5909 Other constipation: Secondary | ICD-10-CM

## 2015-08-27 DIAGNOSIS — D62 Acute posthemorrhagic anemia: Secondary | ICD-10-CM

## 2015-08-27 DIAGNOSIS — G8929 Other chronic pain: Secondary | ICD-10-CM | POA: Diagnosis not present

## 2015-08-27 DIAGNOSIS — I5032 Chronic diastolic (congestive) heart failure: Secondary | ICD-10-CM

## 2015-08-27 DIAGNOSIS — Z4789 Encounter for other orthopedic aftercare: Secondary | ICD-10-CM | POA: Diagnosis not present

## 2015-08-27 DIAGNOSIS — S86891D Other injury of other muscle(s) and tendon(s) at lower leg level, right leg, subsequent encounter: Secondary | ICD-10-CM | POA: Diagnosis not present

## 2015-08-27 NOTE — Progress Notes (Signed)
Patient ID: [name], female   DOB: 1932-10-01, 82 y.o.   MRN: [id number]    DATE:  08/27/15  MRN:  [id number]  BIRTHDAY: Dec 10, 1932  Facility:  Nursing Home Location:  [facility]  Nursing Home Room Number: 1201-P  LEVEL OF CARE:  SNF [phone])  Contact Information    Name Relation Home Work [place] [phone]  [phone]   Gray,Sharon Daughter [phone]  [phone]       Code Status History    Date Active Date Inactive Code Status Order ID Comments User Context   03/08/2015  8:25 PM 03/12/2015  5:09 PM Full Code [id number]  Lanney Gins, PA-C Inpatient   12/15/2014 12:39 AM 12/15/2014  6:36 PM Full Code [id number]  Yevonne Pax, MD Inpatient   12/04/2014  4:00 PM 12/07/2014  5:13 PM Full Code [id number]  Lanney Gins, PA-C Inpatient   12/21/2012  3:56 PM 12/28/2012  7:34 PM DNR [id number]  Tonny Bollman, MD Inpatient      Chief Complaint  Patient presents with  . Medical management of chronic illnesses    HISTORY OF PRESENT ILLNESS:  This is an 82 year old female who is being seen for a routine visit. She is currently having a short-term rehabilitation. Latest hgb is 9.1. No SOB has been noted. CHF is stable. No complaints of abdominal pain and continues to take Prilosec daily.  She has been re-admitted to [facility] on 06/29/15 from [facility] with right patella tendon rupture. She underwent patella tendon repair by Dr. Charlann Boxer. She had a cast on RLE which has been removed already.   PAST MEDICAL HISTORY:  Past Medical History  Diagnosis Date  . Syncope   . Symptomatic bradycardia   . Chronic lower back pain   . Nonischemic cardiomyopathy (HCC)   . HTN (hypertension)   . HLD (hyperlipidemia)   . Atrial fibrillation (HCC)     tachy-brady syndrome with <1% recurrent PAF since pacemaker placement  . H/O: stroke   . Pulmonary embolism (HCC)     HISTORY OF, the pt. had a recurrent bilateral pulmonary emboli in 2005,  on warfarin therapy and at which time she under went implantation of IVC filter  . Anemia     Acute blood loss  . Dementia     Without behavioral disturbance  . Gastroesophageal reflux disease   . Aortic stenosis 10/13/2012    Low EF, low gradient with severe aortic stenosis confirmed by dobutamine stress echocardiogram s/p TAVR 12/2012  . PONV (postoperative nausea and vomiting)   . Osteoarthritis   . Neuropathy (HCC)   . Spinal stenosis   . Symptomatic bradycardia 2012    s/p [employer] PPM  . Chronic combined systolic and diastolic CHF (congestive heart failure) (HCC)   . Fibromyalgia   . Presence of permanent cardiac pacemaker   . Shortness of breath dyspnea     with exertion   . Pneumonia     hx of x 3   . Acute on chronic renal failure [facility])     sees Dr Allena Katz   . CKD (chronic kidney disease)   . Headache     hx of migraines   . On home oxygen therapy     patient uses at nite- 2L- has not used in > 6 months per patient  . Dysrhythmia   . Heart murmur   . Stroke (HCC)   . Asthma   . History of bronchitis   .  Cataracts, bilateral   . Hard of hearing   . H/O dizziness   . Repeated falls   . Hypothyroidism   . History of kidney stones   . Urinary incontinence   . History of urinary tract infection   . H/O urinary frequency   . Urinary urgency   . History of blood transfusion   . Aortic regurgitation   . Sciatica   . Sleep apnea     uses oxygen at night and PRN- not used since > 6 months / DOES NOT USE  C-PAP  . Osteoporosis   . Rupture of right patellar tendon   . Coronary artery disease involving native coronary artery of native heart      CURRENT MEDICATIONS: Reviewed  Patient's Medications  New Prescriptions   No medications on file  Previous Medications   ALENDRONATE (FOSAMAX) 70 MG/75ML SOLUTION    Take 70 mg by mouth every 7 (seven) days. Take with a full glass of water on an empty stomach on Saturday   AMIODARONE (PACERONE) 100 MG TABLET    Take 100 mg  by mouth daily.   ATORVASTATIN (LIPITOR) 10 MG TABLET    Take 10 mg by mouth daily.   BIOTIN 5000 PO    Take 5,000 mcg by mouth every evening.    CALCITRIOL (ROCALTROL) 0.25 MCG CAPSULE    Take 0.25 mcg by mouth every morning.    CARVEDILOL (COREG) 12.5 MG TABLET    Take 1 tablet by mouth 2 (two) times daily.   CETIRIZINE (ZYRTEC) 10 MG TABLET    Take 10 mg by mouth every morning.    CHOLECALCIFEROL (VITAMIN D-3) 1000 UNITS CAPS    Take 2 capsules by mouth. Take 2 capsules to = 2000 units PO QHS   CYCLOSPORINE (RESTASIS) 0.05 % OPHTHALMIC EMULSION    Place 1 drop into both eyes 2 (two) times daily as needed (dry eyes.).   DOCUSATE SODIUM (COLACE) 100 MG CAPSULE    Take 1 capsule (100 mg total) by mouth 2 (two) times daily.   DONEPEZIL (ARICEPT) 10 MG TABLET    Take 10 mg by mouth at bedtime.    DULOXETINE (CYMBALTA) 60 MG CAPSULE    Take 60 mg by mouth every evening.    FERROUS SULFATE 325 (65 FE) MG TABLET    Take 325 mg by mouth 3 (three) times daily after meals.   HYDRALAZINE (APRESOLINE) 10 MG TABLET    Take 10 mg by mouth 3 (three) times daily.    HYDROCODONE-ACETAMINOPHEN (NORCO) 7.5-325 MG TABLET    Take 1-2 tablets by mouth every 4 (four) hours as needed for moderate pain.   IPRATROPIUM-ALBUTEROL (DUONEB) 0.5-2.5 (3) MG/3ML SOLN    Take 3 mLs by nebulization daily as needed.    LEVOTHYROXINE (SYNTHROID, LEVOTHROID) 112 MCG TABLET    Take 112 mcg by mouth daily.    LINACLOTIDE (LINZESS) 145 MCG CAPS CAPSULE    Take 145 mcg by mouth every other day. Alternates with the 290 mg tablet in the am.   LINACLOTIDE 290 MCG CAPS    Take 290 mcg by mouth every other day. Alternates with the 145 mg tablets in the morning.   LINIMENTS (SALONPAS ARTHRITIS PAIN RELIEF EX)    Apply 1 patch topically 2 (two) times daily. Monitor for rash or skin irritation   MEMANTINE (NAMENDA) 10 MG TABLET    Take 10 mg by mouth 2 (two) times daily.    MULTIPLE VITAMIN (MULTIVITAMIN) TABLET  Take 0.5 tablets by mouth  daily. Give 1/2 tablet PO QAM for supplement   NITROGLYCERIN (NITROSTAT) 0.4 MG SL TABLET    Place 0.4 mg under the tongue every 5 (five) minutes as needed for chest pain (MAX 3 TABLETS).    OMEPRAZOLE (PRILOSEC) 20 MG CAPSULE    Take 20 mg by mouth daily.   POLYETHYLENE GLYCOL (MIRALAX / GLYCOLAX) PACKET    Take 17 g by mouth 2 (two) times daily.   PREGABALIN (LYRICA) 75 MG CAPSULE    Take 75 mg by mouth 2 (two) times daily. Give at 9AM and 9PM   PROTEIN (PROCEL) POWD    Take 2 scoop by mouth 2 (two) times daily.   SIMETHICONE (MYLICON) 80 MG CHEWABLE TABLET    Chew 80 mg by mouth 2 (two) times daily as needed for flatulence.    TIZANIDINE (ZANAFLEX) 4 MG TABLET    Take 1 tablet (4 mg total) by mouth every 6 (six) hours as needed for muscle spasms.   TORSEMIDE (DEMADEX) 20 MG TABLET    Take 20 mg by mouth 2 (two) times daily. PEr Dr Allena Katz - patient to not take on 8/14 nor am of 8/15   TURMERIC 500 MG CAPS    Take 1 capsule by mouth 2 (two) times daily.   ULORIC 40 MG TABLET    Take 40 mg by mouth every evening.    VITAMIN B-12 (CYANOCOBALAMIN) 1000 MCG TABLET    Take 1,000 mcg by mouth 2 (two) times a week. Tuesday and Friday   WARFARIN (COUMADIN) 2.5 MG TABLET    Take 2.5 mg by mouth daily.   WARFARIN (COUMADIN) 5 MG TABLET    Take 5 mg by mouth daily.  Modified Medications   No medications on file  Discontinued Medications   COLCHICINE 0.6 MG TABLET    Take 0.3 mg by mouth daily. Give 0.5 tablet to equal 0.3 mg by mouth daily   WARFARIN (COUMADIN) 2 MG TABLET    Take 2 mg by mouth daily.   WARFARIN (COUMADIN) 2.5 MG TABLET    Take by mouth daily. Take two 2.5 mg tablets to = 5 mg x1 day, then 3 mg qd.  INR 07/1915.     Allergies  Allergen Reactions  . Nubain [Nalbuphine Hcl] Hives    Went into cardiac arrest   . Codeine Hives and Nausea Only  . Darvocet [Propoxyphene N-Acetaminophen] Nausea Only     REVIEW OF SYSTEMS:  GENERAL: no change in appetite, no fatigue, no weight changes,  no fever, chills or weakness EYES: Denies change in vision, dry eyes, eye pain, itching or discharge EARS: Denies change in hearing, ringing in ears, or earache NOSE: Denies nasal congestion or epistaxis MOUTH and THROAT: Denies oral discomfort, gingival pain or bleeding, pain from teeth or hoarseness   RESPIRATORY: no cough, SOB, DOE, wheezing, hemoptysis CARDIAC: no chest pain, edema or palpitations GI: no abdominal pain, diarrhea, constipation, heart burn, nausea or vomiting GU: Denies dysuria, frequency, hematuria, incontinence, or discharge PSYCHIATRIC: Denies feeling of depression or anxiety. No report of hallucinations, insomnia, paranoia, or agitation   PHYSICAL EXAMINATION  GENERAL APPEARANCE: Well nourished. In no acute distress. Obese SKIN:  Right heel wound is moist, no erythema , with foam dressing HEAD: Normal in size and contour. No evidence of trauma EYES: Lids open and close normally. No blepharitis, entropion or ectropion. PERRL. Conjunctivae are clear and sclerae are white. Lenses are without opacity EARS: Pinnae are normal. Patient  hears normal voice tunes of the examiner MOUTH and THROAT: Lips are without lesions. Oral mucosa is moist and without lesions. Tongue is normal in shape, size, and color and without lesions NECK: supple, trachea midline, no neck masses, no thyroid tenderness, no thyromegaly LYMPHATICS: no LAN in the neck, no supraclavicular LAN RESPIRATORY: breathing is even & unlabored, BS CTAB CARDIAC: Irregularly irregular, no murmur,no extra heart sounds, no edema GI: abdomen soft, normal BS, no masses, no tenderness, no hepatomegaly, no splenomegaly EXTREMITIES: : Able to move X 4 extremities PSYCHIATRIC: Alert and oriented X 3. Affect and behavior are appropriate  LABS/RADIOLOGY: Labs reviewed: Basic Metabolic Panel:  Recent Labs  16/10/96 0403 06/28/15 1221 06/29/15 06/29/15 0334 07/03/15  NA 142 141 136* 136 146  K 4.3 4.3  --  4.0 3.8  CL  105 106  --  102  --   CO2 28 27  --  26  --   GLUCOSE 104* 106*  --  106*  --   BUN 61* 76* 83* 83* 73*  CREATININE 3.33* 3.37* 2.9* 2.93* 2.2*  CALCIUM 8.8* 9.0  --  8.6*  --    Liver Function Tests:  Recent Labs  12/15/14 0127 07/03/15  AST 48* 15  ALT 32 4*  ALKPHOS 68 70  BILITOT 0.7  --   PROT 5.7*  --   ALBUMIN 3.2*  --    CBC:  Recent Labs  12/14/14 2000  06/27/15 0403 06/28/15 1221 06/29/15 06/29/15 0334 07/03/15  WBC 8.4  < > 7.1 7.2 6.4 6.4 5.7  NEUTROABS 5.2  --   --   --   --   --  3  HGB 8.3*  < > 9.4* 10.1*  --  9.4* 9.1*  HCT 25.7*  < > 30.2* 31.6*  --  28.1* 28*  MCV 95.9  < > 93.5 92.9  --  87.5  --   PLT 269  < > 209 208  --  200 229  < > = values in this interval not displayed.  CBG:  Recent Labs  12/07/14 1211 12/15/14 0737  GLUCAP 144* 104*     ASSESSMENT/PLAN:   Right patella tendon rupture S/P repair - continue rehabilitation;  RLE  WBAT; follow-up with Dr. Charlann Boxer, orthopedics; fall precautions; continue Coumadin for DVT prophylaxis; Norco 7.5/325mg  1-2 tabs Q 4 hours PRN for pain; Tizanidine 4 mg 1 tab PO Q 6 hours PRN for muscle spasm  Blood loss anemia - hgb 9.1; continue FeSO4 325 mg 1 tab PO TID. Check CBC  Atrial fibrillation - rate controlled. continue coreg 12.5 mg bid, amiodarone 100 mg daily and Coumadin  Protein-calorie malnutrition, severe - albumin 2.98; continue Procel 2 scoops BID  Osteoporosis - Continue weekly fosamax and calcitriol for now. Fall precautions  Allergic rhinitis - continue cetirizine 10 mg daily   GERD - stable, continue prilosec 20 mg daily  Hypertension with renal disease - continue hydralazine 10 mg tid with coreg 12.5 mg bid  CKD stage 3 - creatinine 2.15, check BMP  Dementia - Continue donepezil and memantine   Chronic constipation - continue Colace 100 mg bid, Linzess 145 MCG alternating with 290 mcg daily and miralax bid.  CHF - no SOB; torsemide, coreg and hydralazine  Neuropathy -  continue Lyrica 75 mg bid and cymbalta 60 mg daily  CAD - stable; continue coreg, hydralazine, prn NTG, statin      Goals of care:  Short-term rehabilitation    Kenard Gower, NP  [place] [phone]

## 2015-08-28 ENCOUNTER — Encounter: Payer: Self-pay | Admitting: Adult Health

## 2015-08-28 ENCOUNTER — Non-Acute Institutional Stay (SKILLED_NURSING_FACILITY): Payer: Medicare Other | Admitting: Adult Health

## 2015-08-28 DIAGNOSIS — Z7901 Long term (current) use of anticoagulants: Secondary | ICD-10-CM | POA: Diagnosis not present

## 2015-08-28 DIAGNOSIS — I480 Paroxysmal atrial fibrillation: Secondary | ICD-10-CM

## 2015-08-28 NOTE — Progress Notes (Signed)
Patient ID: AZKA WICKENHAUSER, female   DOB: 04-Feb-1933, 82 y.o.   MRN: [id number] Subjective:     Indication: atrial fibrillation Bleeding signs/symptoms: None Thromboembolic signs/symptoms: None  Missed Coumadin doses: None Medication changes: no Dietary changes: no Bacterial/viral infection: no Other concerns: no  The following portions of the patient's history were reviewed and updated as appropriate: allergies, current medications, past family history, past medical history, past social history, past surgical history and problem list.  Review of Systems A comprehensive review of systems was negative.   Objective:    INR Today: 1.2 Current dose: Coumadin 2 mg daily     Assessment:    Subtherapeutic INR for goal of 2-3   Plan:    1. New dose: Give Coumadin 5 mg PO X 1 today then Coumadin 2.5 mg daily   2. Next INR: 08/31/15

## 2015-08-29 LAB — CBC AND DIFFERENTIAL
HEMATOCRIT: 34 % — AB (ref 36–46)
HEMOGLOBIN: 10.6 g/dL — AB (ref 12.0–16.0)
NEUTROS ABS: 2 /uL
PLATELETS: 176 10*3/uL (ref 150–399)
WBC: 4.5 10*3/mL

## 2015-09-01 DIAGNOSIS — I509 Heart failure, unspecified: Secondary | ICD-10-CM | POA: Diagnosis not present

## 2015-09-04 DIAGNOSIS — R262 Difficulty in walking, not elsewhere classified: Secondary | ICD-10-CM | POA: Diagnosis not present

## 2015-09-04 DIAGNOSIS — Z4789 Encounter for other orthopedic aftercare: Secondary | ICD-10-CM | POA: Diagnosis not present

## 2015-09-04 DIAGNOSIS — R5381 Other malaise: Secondary | ICD-10-CM | POA: Diagnosis not present

## 2015-09-04 DIAGNOSIS — G8929 Other chronic pain: Secondary | ICD-10-CM | POA: Diagnosis not present

## 2015-09-04 DIAGNOSIS — S86891D Other injury of other muscle(s) and tendon(s) at lower leg level, right leg, subsequent encounter: Secondary | ICD-10-CM | POA: Diagnosis not present

## 2015-09-06 DIAGNOSIS — G8929 Other chronic pain: Secondary | ICD-10-CM | POA: Diagnosis not present

## 2015-09-06 DIAGNOSIS — R262 Difficulty in walking, not elsewhere classified: Secondary | ICD-10-CM | POA: Diagnosis not present

## 2015-09-06 DIAGNOSIS — R5381 Other malaise: Secondary | ICD-10-CM | POA: Diagnosis not present

## 2015-09-06 DIAGNOSIS — S86891D Other injury of other muscle(s) and tendon(s) at lower leg level, right leg, subsequent encounter: Secondary | ICD-10-CM | POA: Diagnosis not present

## 2015-09-06 DIAGNOSIS — I10 Essential (primary) hypertension: Secondary | ICD-10-CM | POA: Diagnosis not present

## 2015-09-06 DIAGNOSIS — Z4789 Encounter for other orthopedic aftercare: Secondary | ICD-10-CM | POA: Diagnosis not present

## 2015-09-06 LAB — BASIC METABOLIC PANEL
BUN: 56 mg/dL — AB (ref 4–21)
CREATININE: 2 mg/dL — AB (ref 0.5–1.1)
Glucose: 85 mg/dL
POTASSIUM: 3.8 mmol/L (ref 3.4–5.3)
SODIUM: 139 mmol/L (ref 137–147)

## 2015-09-12 DIAGNOSIS — R5381 Other malaise: Secondary | ICD-10-CM | POA: Diagnosis not present

## 2015-09-12 DIAGNOSIS — S86891D Other injury of other muscle(s) and tendon(s) at lower leg level, right leg, subsequent encounter: Secondary | ICD-10-CM | POA: Diagnosis not present

## 2015-09-12 DIAGNOSIS — Z4789 Encounter for other orthopedic aftercare: Secondary | ICD-10-CM | POA: Diagnosis not present

## 2015-09-12 DIAGNOSIS — G8929 Other chronic pain: Secondary | ICD-10-CM | POA: Diagnosis not present

## 2015-09-12 DIAGNOSIS — R262 Difficulty in walking, not elsewhere classified: Secondary | ICD-10-CM | POA: Diagnosis not present

## 2015-09-13 ENCOUNTER — Encounter: Payer: Self-pay | Admitting: Adult Health

## 2015-09-13 ENCOUNTER — Non-Acute Institutional Stay (SKILLED_NURSING_FACILITY): Payer: Medicare Other | Admitting: Adult Health

## 2015-09-13 DIAGNOSIS — Z7901 Long term (current) use of anticoagulants: Secondary | ICD-10-CM

## 2015-09-13 DIAGNOSIS — Z471 Aftercare following joint replacement surgery: Secondary | ICD-10-CM | POA: Diagnosis not present

## 2015-09-13 DIAGNOSIS — Z96651 Presence of right artificial knee joint: Secondary | ICD-10-CM | POA: Diagnosis not present

## 2015-09-13 DIAGNOSIS — I480 Paroxysmal atrial fibrillation: Secondary | ICD-10-CM

## 2015-09-13 DIAGNOSIS — S86811D Strain of other muscle(s) and tendon(s) at lower leg level, right leg, subsequent encounter: Secondary | ICD-10-CM | POA: Diagnosis not present

## 2015-09-14 NOTE — Progress Notes (Signed)
Patient ID: [name], female   DOB: Aug 06, 1932, 82 y.o.   MRN: [id number] Subjective:     Indication: atrial fibrillation Bleeding signs/symptoms: None Thromboembolic signs/symptoms: None  Missed Coumadin doses: This week - 2 due to upratherapeutic INR Medication changes: no Dietary changes: no Bacterial/viral infection: no Other concerns: no  The following portions of the patient's history were reviewed and updated as appropriate: allergies, current medications, past family history, past medical history, past social history, past surgical history and problem list.  Review of Systems A comprehensive review of systems was negative.   Objective:    INR Today: 2.4 Current dose: Coumadin 2.5 mg daily     Assessment:    Therapeutic INR for goal of 2-3   Plan:    1. New dose: Decrease Coumadin to 1.5 mg daily   2. Next INR: 09/18/15

## 2015-09-26 ENCOUNTER — Encounter: Payer: Self-pay | Admitting: Adult Health

## 2015-09-26 ENCOUNTER — Non-Acute Institutional Stay (SKILLED_NURSING_FACILITY): Payer: Medicare Other | Admitting: Adult Health

## 2015-09-26 DIAGNOSIS — K5900 Constipation, unspecified: Secondary | ICD-10-CM

## 2015-09-26 DIAGNOSIS — D62 Acute posthemorrhagic anemia: Secondary | ICD-10-CM

## 2015-09-26 DIAGNOSIS — J309 Allergic rhinitis, unspecified: Secondary | ICD-10-CM

## 2015-09-26 DIAGNOSIS — K5909 Other constipation: Secondary | ICD-10-CM

## 2015-09-26 DIAGNOSIS — G629 Polyneuropathy, unspecified: Secondary | ICD-10-CM | POA: Diagnosis not present

## 2015-09-26 DIAGNOSIS — I480 Paroxysmal atrial fibrillation: Secondary | ICD-10-CM

## 2015-09-26 DIAGNOSIS — E785 Hyperlipidemia, unspecified: Secondary | ICD-10-CM | POA: Diagnosis not present

## 2015-09-26 DIAGNOSIS — K219 Gastro-esophageal reflux disease without esophagitis: Secondary | ICD-10-CM

## 2015-09-26 DIAGNOSIS — F0390 Unspecified dementia without behavioral disturbance: Secondary | ICD-10-CM

## 2015-09-26 DIAGNOSIS — I25119 Atherosclerotic heart disease of native coronary artery with unspecified angina pectoris: Secondary | ICD-10-CM | POA: Diagnosis not present

## 2015-09-26 DIAGNOSIS — S86811S Strain of other muscle(s) and tendon(s) at lower leg level, right leg, sequela: Secondary | ICD-10-CM | POA: Diagnosis not present

## 2015-09-26 DIAGNOSIS — M810 Age-related osteoporosis without current pathological fracture: Secondary | ICD-10-CM

## 2015-09-26 DIAGNOSIS — I5032 Chronic diastolic (congestive) heart failure: Secondary | ICD-10-CM

## 2015-09-26 DIAGNOSIS — N183 Chronic kidney disease, stage 3 unspecified: Secondary | ICD-10-CM

## 2015-09-26 DIAGNOSIS — E43 Unspecified severe protein-calorie malnutrition: Secondary | ICD-10-CM

## 2015-09-26 DIAGNOSIS — I10 Essential (primary) hypertension: Secondary | ICD-10-CM

## 2015-09-26 NOTE — Progress Notes (Signed)
Patient ID: [name], female   DOB: 1933-03-02, 82 y.o.   MRN: [id number]    DATE:    09/26/15  MRN:  [id number]  BIRTHDAY: October 12, 1932  Facility:  Nursing Home Location:  [facility]  Nursing Home Room Number: [id number]  LEVEL OF CARE:  SNF [phone])  Contact Information    Name Relation Home Work [place] [phone]  [phone]   Gray,Sharon Daughter [phone]  [phone]       Code Status History    Date Active Date Inactive Code Status Order ID Comments User Context   03/08/2015  8:25 PM 03/12/2015  5:09 PM Full Code [id number]  Lanney Gins, PA-C Inpatient   12/15/2014 12:39 AM 12/15/2014  6:36 PM Full Code [id number]  Yevonne Pax, MD Inpatient   12/04/2014  4:00 PM 12/07/2014  5:13 PM Full Code [id number]  Lanney Gins, PA-C Inpatient   12/21/2012  3:56 PM 12/28/2012  7:34 PM DNR [id number]  Tonny Bollman, MD Inpatient      Chief Complaint  Patient presents with  . Medical management of chronic illnesses    HISTORY OF PRESENT ILLNESS:  This is an 82 year old female who is being seen for a routine visit. She is currently having a short-term rehabilitation. Torsemide was recently decreased to 20 mg daily due to creatinine of 2.32. No SOB noted. She takes Torsemide for CHF. No complaints of chest pain. She has A-fib and heart rate is rate-controlled.  She has been re-admitted to [facility] on 06/29/15 from [facility] with right patella tendon rupture. She underwent patella tendon repair by Dr. Charlann Boxer. She had a cast on RLE which has been removed already.  She is currently having short-term rehabilitation.   PAST MEDICAL HISTORY:  Past Medical History  Diagnosis Date  . Syncope   . Symptomatic bradycardia   . Chronic lower back pain   . Nonischemic cardiomyopathy (HCC)   . HTN (hypertension)   . HLD (hyperlipidemia)   . Atrial fibrillation (HCC)     tachy-brady syndrome with <1% recurrent PAF since pacemaker placement   . H/O: stroke   . Pulmonary embolism (HCC)     HISTORY OF, the pt. had a recurrent bilateral pulmonary emboli in 2005, on warfarin therapy and at which time she under went implantation of IVC filter  . Anemia     Acute blood loss  . Dementia     Without behavioral disturbance  . Gastroesophageal reflux disease   . Aortic stenosis 10/13/2012    Low EF, low gradient with severe aortic stenosis confirmed by dobutamine stress echocardiogram s/p TAVR 12/2012  . PONV (postoperative nausea and vomiting)   . Osteoarthritis   . Neuropathy (HCC)   . Spinal stenosis   . Symptomatic bradycardia 2012    s/p [employer] PPM  . Chronic combined systolic and diastolic CHF (congestive heart failure) (HCC)   . Fibromyalgia   . Presence of permanent cardiac pacemaker   . Shortness of breath dyspnea     with exertion   . Pneumonia     hx of x 3   . Acute on chronic renal failure [facility])     sees Dr Allena Katz   . CKD (chronic kidney disease)   . Headache     hx of migraines   . On home oxygen therapy     patient uses at nite- 2L- has not used in > 6 months per patient  . Dysrhythmia   .  Heart murmur   . Stroke (HCC)   . Asthma   . History of bronchitis   . Cataracts, bilateral   . Hard of hearing   . H/O dizziness   . Repeated falls   . Hypothyroidism   . History of kidney stones   . Urinary incontinence   . History of urinary tract infection   . H/O urinary frequency   . Urinary urgency   . History of blood transfusion   . Aortic regurgitation   . Sciatica   . Sleep apnea     uses oxygen at night and PRN- not used since > 6 months / DOES NOT USE  C-PAP  . Osteoporosis   . Rupture of right patellar tendon   . Coronary artery disease involving native coronary artery of native heart      CURRENT MEDICATIONS: Reviewed  Patient's Medications  New Prescriptions   No medications on file  Previous Medications   ALENDRONATE (FOSAMAX) 70 MG/75ML SOLUTION    Take 70 mg by mouth every 7 (seven)  days. Take with a full glass of water on an empty stomach on Saturday   AMIODARONE (PACERONE) 100 MG TABLET    Take 100 mg by mouth daily.   ATORVASTATIN (LIPITOR) 10 MG TABLET    Take 10 mg by mouth daily.   BIOTIN 5000 PO    Take 5,000 mcg by mouth every evening.    CALCITRIOL (ROCALTROL) 0.25 MCG CAPSULE    Take 0.25 mcg by mouth every morning.    CARVEDILOL (COREG) 12.5 MG TABLET    Take 1 tablet by mouth 2 (two) times daily.   CETIRIZINE (ZYRTEC) 10 MG TABLET    Take 10 mg by mouth every morning.    CHOLECALCIFEROL (VITAMIN D-3) 1000 UNITS CAPS    Take 2 capsules by mouth. Take 2 capsules to = 2000 units PO QHS   CYCLOSPORINE (RESTASIS) 0.05 % OPHTHALMIC EMULSION    Place 1 drop into both eyes 2 (two) times daily as needed (dry eyes.).   DOCUSATE SODIUM (COLACE) 100 MG CAPSULE    Take 1 capsule (100 mg total) by mouth 2 (two) times daily.   DONEPEZIL (ARICEPT) 10 MG TABLET    Take 10 mg by mouth at bedtime.    DULOXETINE (CYMBALTA) 60 MG CAPSULE    Take 60 mg by mouth every evening.    FERROUS SULFATE 325 (65 FE) MG TABLET    Take 325 mg by mouth 3 (three) times daily after meals.   HYDRALAZINE (APRESOLINE) 10 MG TABLET    Take 10 mg by mouth 3 (three) times daily.    HYDROCODONE-ACETAMINOPHEN (NORCO) 7.5-325 MG TABLET    Take 1-2 tablets by mouth every 4 (four) hours as needed for moderate pain.   IPRATROPIUM-ALBUTEROL (DUONEB) 0.5-2.5 (3) MG/3ML SOLN    Take 3 mLs by nebulization daily as needed.    LEVOTHYROXINE (SYNTHROID, LEVOTHROID) 112 MCG TABLET    Take 112 mcg by mouth daily.    LINACLOTIDE (LINZESS) 145 MCG CAPS CAPSULE    Take 145 mcg by mouth every other day. Alternates with the 290 mg tablet in the am.   LINACLOTIDE 290 MCG CAPS    Take 290 mcg by mouth every other day. Alternates with the 145 mg tablets in the morning.   LINIMENTS (SALONPAS ARTHRITIS PAIN RELIEF EX)    Apply 1 patch topically 2 (two) times daily. Monitor for rash or skin irritation   MEMANTINE (NAMENDA) 10 MG  TABLET  Take 10 mg by mouth 2 (two) times daily.    MULTIPLE VITAMIN (MULTIVITAMIN) TABLET    Take 0.5 tablets by mouth daily. Give 1/2 tablet PO QAM for supplement   NITROGLYCERIN (NITROSTAT) 0.4 MG SL TABLET    Place 0.4 mg under the tongue every 5 (five) minutes as needed for chest pain (MAX 3 TABLETS).    OMEPRAZOLE (PRILOSEC) 20 MG CAPSULE    Take 20 mg by mouth daily.   POLYETHYLENE GLYCOL (MIRALAX / GLYCOLAX) PACKET    Take 17 g by mouth 2 (two) times daily.   PREGABALIN (LYRICA) 75 MG CAPSULE    Take 75 mg by mouth 2 (two) times daily. Give at 9AM and 9PM   PROTEIN (PROCEL) POWD    Take 2 scoop by mouth 2 (two) times daily.   SIMETHICONE (MYLICON) 80 MG CHEWABLE TABLET    Chew 80 mg by mouth 2 (two) times daily as needed for flatulence.    TIZANIDINE (ZANAFLEX) 4 MG TABLET    Take 1 tablet (4 mg total) by mouth every 6 (six) hours as needed for muscle spasms.   TORSEMIDE (DEMADEX) 20 MG TABLET    Take 20 mg by mouth daily.    TURMERIC 500 MG CAPS    Take 1 capsule by mouth 2 (two) times daily.   ULORIC 40 MG TABLET    Take 40 mg by mouth every evening.    VITAMIN B-12 (CYANOCOBALAMIN) 1000 MCG TABLET    Take 1,000 mcg by mouth 2 (two) times a week. Tuesday and Friday   WARFARIN (COUMADIN) 4 MG TABLET    Take 4 mg by mouth. 4 mg every Friday   WARFARIN SODIUM (COUMADIN PO)    Take 3 mg by mouth. 3 mg daily except for Fridays  Modified Medications   No medications on file  Discontinued Medications   No medications on file     Allergies  Allergen Reactions  . Nubain [Nalbuphine Hcl] Hives    Went into cardiac arrest   . Codeine Hives and Nausea Only  . Darvocet [Propoxyphene N-Acetaminophen] Nausea Only     REVIEW OF SYSTEMS:  GENERAL: no change in appetite, no fatigue, no weight changes, no fever, chills or weakness EYES: Denies change in vision, dry eyes, eye pain, itching or discharge EARS: Denies change in hearing, ringing in ears, or earache NOSE: Denies nasal  congestion or epistaxis MOUTH and THROAT: Denies oral discomfort, gingival pain or bleeding, pain from teeth or hoarseness   RESPIRATORY: no cough, SOB, DOE, wheezing, hemoptysis CARDIAC: no chest pain, edema or palpitations GI: no abdominal pain, diarrhea, constipation, heart burn, nausea or vomiting GU: Denies dysuria, frequency, hematuria, incontinence, or discharge PSYCHIATRIC: Denies feeling of depression or anxiety. No report of hallucinations, insomnia, paranoia, or agitation   PHYSICAL EXAMINATION  GENERAL APPEARANCE: Well nourished. In no acute distress. Obese SKIN:  Right heel wound is moist, no erythema , with foam dressing HEAD: Normal in size and contour. No evidence of trauma EYES: Lids open and close normally. No blepharitis, entropion or ectropion. PERRL. Conjunctivae are clear and sclerae are white. Lenses are without opacity EARS: Pinnae are normal. Patient hears normal voice tunes of the examiner MOUTH and THROAT: Lips are without lesions. Oral mucosa is moist and without lesions. Tongue is normal in shape, size, and color and without lesions NECK: supple, trachea midline, no neck masses, no thyroid tenderness, no thyromegaly LYMPHATICS: no LAN in the neck, no supraclavicular LAN RESPIRATORY: breathing is even &  unlabored, BS CTAB CARDIAC: Irregularly irregular, no murmur,no extra heart sounds, no edema GI: abdomen soft, normal BS, no masses, no tenderness, no hepatomegaly, no splenomegaly EXTREMITIES: : Able to move X 4 extremities PSYCHIATRIC: Alert and oriented X 3. Affect and behavior are appropriate  LABS/RADIOLOGY: Labs reviewed: Basic Metabolic Panel:  Recent Labs  41/96/22 0403 06/28/15 1221  06/29/15 0334 07/03/15 09/06/15  NA 142 141  < > 136 146 139  K 4.3 4.3  --  4.0 3.8 3.8  CL 105 106  --  102  --   --   CO2 28 27  --  26  --   --   GLUCOSE 104* 106*  --  106*  --   --   BUN 61* 76*  < > 83* 73* 56*  CREATININE 3.33* 3.37*  < > 2.93* 2.2* 2.0*   CALCIUM 8.8* 9.0  --  8.6*  --   --   < > = values in this interval not displayed. Liver Function Tests:  Recent Labs  12/15/14 0127 07/03/15  AST 48* 15  ALT 32 4*  ALKPHOS 68 70  BILITOT 0.7  --   PROT 5.7*  --   ALBUMIN 3.2*  --    CBC:  Recent Labs  06/27/15 0403 06/28/15 1221  06/29/15 0334 07/03/15 07/13/15 08/29/15  WBC 7.1 7.2  < > 6.4 5.7 6.4 4.5  NEUTROABS  --   --   --   --  3 4 2   HGB 9.4* 10.1*  --  9.4* 9.1* 9.9* 10.6*  HCT 30.2* 31.6*  --  28.1* 28* 30* 34*  MCV 93.5 92.9  --  87.5  --   --   --   PLT 209 208  --  200 229 292 176  < > = values in this interval not displayed.  CBG:  Recent Labs  12/07/14 1211 12/15/14 0737  GLUCAP 144* 104*     ASSESSMENT/PLAN:   Right patella tendon rupture S/P repair - continue rehabilitation;  RLE  WBAT; follow-up with Dr. Charlann Boxer, orthopedics; fall precautions; continue Coumadin for DVT prophylaxis; Norco 7.5/325mg  1-2 tabs Q 4 hours PRN for pain; Tizanidine 4 mg 1 tab PO Q 6 hours PRN for muscle spasm  Atrial fibrillation - rate controlled. continue coreg 12.5 mg bid, amiodarone 100 mg daily and Coumadin  CAD - stable; continue coreg, hydralazine, prn NTG, Atorvastatin 10 mg daily   Blood loss anemia - continue FeSO4 325 mg 1 tab PO TID Lab Results  Component Value Date   WBC 4.5 08/29/2015   HGB 10.6* 08/29/2015   HCT 34* 08/29/2015   MCV 87.5 06/29/2015   PLT 176 08/29/2015    Protein-calorie malnutrition, severe - albumin 2.98; continue Procel 2 scoops BID  Osteoporosis - Continue weekly fosamax and calcitriol for now. Fall precautions  Allergic rhinitis - continue cetirizine 10 mg daily   GERD - stable, continue prilosec 20 mg daily  Hypertension with renal disease - continue hydralazine 10 mg tid with coreg 12.5 mg bid  CKD stage 3 - recently decreased Torsemide dosage; repeat BMP Lab Results  Component Value Date   CREATININE 2.0* 09/06/2015    Dementia - Continue donepezil and  memantine   Chronic constipation - continue Colace 100 mg bid, Linzess 145 MCG alternating with 290 mcg daily and miralax bid.  CHF - no SOB; torsemide, coreg and hydralazine  Neuropathy - continue Lyrica 75 mg bid and cymbalta 60 mg daily  Hyperlipidemia -  continue Atorvastatin 10 mg daily; repeat lipid panel Lab Results  Component Value Date   CHOL  06/16/2010    89        ATP III CLASSIFICATION:  <200     mg/dL   Desirable  161-096  mg/dL   Borderline High  >=045    mg/dL   High          HDL 49 06/16/2010   LDLCALC  06/16/2010    27        Total Cholesterol/HDL:CHD Risk Coronary Heart Disease Risk Table                     Men   Women  1/2 Average Risk   3.4   3.3  Average Risk       5.0   4.4  2 X Average Risk   9.6   7.1  3 X Average Risk  23.4   11.0        Use the calculated Patient Ratio above and the CHD Risk Table to determine the patient's CHD Risk.        ATP III CLASSIFICATION (LDL):  <100     mg/dL   Optimal  409-811  mg/dL   Near or Above                    Optimal  130-159  mg/dL   Borderline  914-782  mg/dL   High  >956     mg/dL   Very High   TRIG 64 21/30/8657   CHOLHDL 1.8 06/16/2010       Goals of care:  Short-term rehabilitation    Kenard Gower, NP [facility] [phone]

## 2015-09-27 DIAGNOSIS — Z4789 Encounter for other orthopedic aftercare: Secondary | ICD-10-CM | POA: Diagnosis not present

## 2015-09-27 DIAGNOSIS — M6281 Muscle weakness (generalized): Secondary | ICD-10-CM | POA: Diagnosis not present

## 2015-09-27 DIAGNOSIS — M66269 Spontaneous rupture of extensor tendons, unspecified lower leg: Secondary | ICD-10-CM | POA: Diagnosis not present

## 2015-09-28 ENCOUNTER — Encounter: Payer: Self-pay | Admitting: Adult Health

## 2015-09-28 ENCOUNTER — Non-Acute Institutional Stay (SKILLED_NURSING_FACILITY): Payer: Medicare Other | Admitting: Adult Health

## 2015-09-28 DIAGNOSIS — I480 Paroxysmal atrial fibrillation: Secondary | ICD-10-CM

## 2015-09-28 DIAGNOSIS — Z7901 Long term (current) use of anticoagulants: Secondary | ICD-10-CM | POA: Diagnosis not present

## 2015-09-28 NOTE — Progress Notes (Signed)
Patient ID: [name], female   DOB: 03-12-1933, 82 y.o.   MRN: [id number] Subjective:     Indication: atrial fibrillation Bleeding signs/symptoms: None Thromboembolic signs/symptoms: None  Missed Coumadin doses: None Medication changes: no Dietary changes: no Bacterial/viral infection: no Other concerns: no  The following portions of the patient's history were reviewed and updated as appropriate: allergies, current medications, past family history, past medical history, past social history, past surgical history and problem list.  Review of Systems A comprehensive review of systems was negative.   Objective:    INR Today: 1.7 Current dose: Coumadin 3 mg daily     Assessment:    Subtherapeutic INR for goal of 2-3   Plan:    1. New dose: Start Coumadin 4 mg PO Q Fridays the Coumadin 3 mg PO Q D except Fridays   2. Next INR: 10/02/15

## 2015-10-01 DIAGNOSIS — E786 Lipoprotein deficiency: Secondary | ICD-10-CM | POA: Diagnosis not present

## 2015-10-01 DIAGNOSIS — Z139 Encounter for screening, unspecified: Secondary | ICD-10-CM | POA: Diagnosis not present

## 2015-10-01 LAB — BASIC METABOLIC PANEL
BUN: 55 mg/dL — AB (ref 4–21)
CREATININE: 2.4 mg/dL — AB (ref 0.5–1.1)
Glucose: 83 mg/dL
POTASSIUM: 3.7 mmol/L (ref 3.4–5.3)
Sodium: 143 mmol/L (ref 137–147)

## 2015-10-01 LAB — LIPID PANEL
CHOLESTEROL: 153 mg/dL (ref 0–200)
HDL: 36 mg/dL (ref 35–70)
LDL Cholesterol: 67 mg/dL
TRIGLYCERIDES: 247 mg/dL — AB (ref 40–160)

## 2015-10-02 DIAGNOSIS — I509 Heart failure, unspecified: Secondary | ICD-10-CM | POA: Diagnosis not present

## 2015-10-03 DIAGNOSIS — R262 Difficulty in walking, not elsewhere classified: Secondary | ICD-10-CM | POA: Diagnosis not present

## 2015-10-03 DIAGNOSIS — S86891D Other injury of other muscle(s) and tendon(s) at lower leg level, right leg, subsequent encounter: Secondary | ICD-10-CM | POA: Diagnosis not present

## 2015-10-03 DIAGNOSIS — Z4789 Encounter for other orthopedic aftercare: Secondary | ICD-10-CM | POA: Diagnosis not present

## 2015-10-03 DIAGNOSIS — W19XXXA Unspecified fall, initial encounter: Secondary | ICD-10-CM | POA: Diagnosis not present

## 2015-10-03 DIAGNOSIS — M25552 Pain in left hip: Secondary | ICD-10-CM | POA: Diagnosis not present

## 2015-10-03 DIAGNOSIS — R5381 Other malaise: Secondary | ICD-10-CM | POA: Diagnosis not present

## 2015-10-03 DIAGNOSIS — G8929 Other chronic pain: Secondary | ICD-10-CM | POA: Diagnosis not present

## 2015-10-03 DIAGNOSIS — M79672 Pain in left foot: Secondary | ICD-10-CM | POA: Diagnosis not present

## 2015-10-04 DIAGNOSIS — Z4789 Encounter for other orthopedic aftercare: Secondary | ICD-10-CM | POA: Diagnosis not present

## 2015-10-04 DIAGNOSIS — R5381 Other malaise: Secondary | ICD-10-CM | POA: Diagnosis not present

## 2015-10-04 DIAGNOSIS — R262 Difficulty in walking, not elsewhere classified: Secondary | ICD-10-CM | POA: Diagnosis not present

## 2015-10-04 DIAGNOSIS — G8929 Other chronic pain: Secondary | ICD-10-CM | POA: Diagnosis not present

## 2015-10-04 DIAGNOSIS — S86891D Other injury of other muscle(s) and tendon(s) at lower leg level, right leg, subsequent encounter: Secondary | ICD-10-CM | POA: Diagnosis not present

## 2015-10-08 DIAGNOSIS — I10 Essential (primary) hypertension: Secondary | ICD-10-CM | POA: Diagnosis not present

## 2015-10-08 LAB — BASIC METABOLIC PANEL
BUN: 46 mg/dL — AB (ref 4–21)
Creatinine: 2 mg/dL — AB (ref 0.5–1.1)
GLUCOSE: 84 mg/dL
POTASSIUM: 3.8 mmol/L (ref 3.4–5.3)
Sodium: 146 mmol/L (ref 137–147)

## 2015-10-15 DIAGNOSIS — M25661 Stiffness of right knee, not elsewhere classified: Secondary | ICD-10-CM | POA: Diagnosis not present

## 2015-10-15 DIAGNOSIS — M6281 Muscle weakness (generalized): Secondary | ICD-10-CM | POA: Diagnosis not present

## 2015-10-16 ENCOUNTER — Encounter: Payer: Self-pay | Admitting: Adult Health

## 2015-10-16 ENCOUNTER — Non-Acute Institutional Stay (SKILLED_NURSING_FACILITY): Payer: Medicare Other | Admitting: Adult Health

## 2015-10-16 DIAGNOSIS — Z7901 Long term (current) use of anticoagulants: Secondary | ICD-10-CM

## 2015-10-16 DIAGNOSIS — M6281 Muscle weakness (generalized): Secondary | ICD-10-CM | POA: Diagnosis not present

## 2015-10-16 DIAGNOSIS — I480 Paroxysmal atrial fibrillation: Secondary | ICD-10-CM | POA: Diagnosis not present

## 2015-10-16 DIAGNOSIS — M25661 Stiffness of right knee, not elsewhere classified: Secondary | ICD-10-CM | POA: Diagnosis not present

## 2015-10-16 NOTE — Progress Notes (Signed)
Patient ID: [name], female   DOB: August 07, 1932, 82 y.o.   MRN: [id number] Subjective:     Indication: atrial fibrillation Bleeding signs/symptoms: None Thromboembolic signs/symptoms: None  Missed Coumadin doses: None Medication changes: no Dietary changes: no Bacterial/viral infection: no Other concerns: no  The following portions of the patient's history were reviewed and updated as appropriate: allergies, current medications, past family history, past medical history, past social history, past surgical history and problem list.  Review of Systems A comprehensive review of systems was negative.   Objective:    INR Today: 1.5 Current dose: Coumadin 2 mg     Assessment:    Subtherapeutic INR for goal of 2-3   Plan:    1. New dose: Coumadin 5 mg PO X 1 today the increase Coumadin from 2 mg to 2.5 mg daily   2. Next INR: 10/19/15

## 2015-10-17 DIAGNOSIS — M6281 Muscle weakness (generalized): Secondary | ICD-10-CM | POA: Diagnosis not present

## 2015-10-17 DIAGNOSIS — M25661 Stiffness of right knee, not elsewhere classified: Secondary | ICD-10-CM | POA: Diagnosis not present

## 2015-10-18 ENCOUNTER — Encounter: Payer: Self-pay | Admitting: Adult Health

## 2015-10-18 ENCOUNTER — Non-Acute Institutional Stay (SKILLED_NURSING_FACILITY): Payer: Medicare Other | Admitting: Adult Health

## 2015-10-18 DIAGNOSIS — M6281 Muscle weakness (generalized): Secondary | ICD-10-CM | POA: Diagnosis not present

## 2015-10-18 DIAGNOSIS — M25661 Stiffness of right knee, not elsewhere classified: Secondary | ICD-10-CM | POA: Diagnosis not present

## 2015-10-18 DIAGNOSIS — M159 Polyosteoarthritis, unspecified: Secondary | ICD-10-CM | POA: Diagnosis not present

## 2015-10-18 DIAGNOSIS — I10 Essential (primary) hypertension: Secondary | ICD-10-CM | POA: Diagnosis not present

## 2015-10-18 NOTE — Progress Notes (Signed)
Patient ID: KHORI UNDERBERG, female   DOB: [birth year], 82 y.o.   MRN: [id number]    DATE:    10/18/15  MRN:  [id number]  BIRTHDAY: Mar 25, 1933  Facility:  Nursing Home Location:  [facility]  Nursing Home Room Number: 905-B  LEVEL OF CARE:  SNF [phone])  Contact Information    Name Relation Home Work [place] [phone]  [phone]   Gray,Sharon Daughter [phone]  [phone]       Code Status History    Date Active Date Inactive Code Status Order ID Comments User Context   03/08/2015  8:25 PM 03/12/2015  5:09 PM Full Code [id number]  Lanney Gins, PA-C Inpatient   12/15/2014 12:39 AM 12/15/2014  6:36 PM Full Code [id number]  Yevonne Pax, MD Inpatient   12/04/2014  4:00 PM 12/07/2014  5:13 PM Full Code [id number]  Lanney Gins, PA-C Inpatient   12/21/2012  3:56 PM 12/28/2012  7:34 PM DNR [id number]  Tonny Bollman, MD Inpatient      Chief Complaint  Patient presents with  . Acute Visit:   Hypertension, Osteoarthritis    HISTORY OF PRESENT ILLNESS:  This is an 82 year old female who was noted to have BPs 179/79, 164/84, 181/85, 176/91 and 199/89. No headache nor dizziness. She complains of arthritic pain on her lower back, right leg and back of neck. No edema noted.  PAST MEDICAL HISTORY:  Past Medical History  Diagnosis Date  . Syncope   . Symptomatic bradycardia   . Chronic lower back pain   . Nonischemic cardiomyopathy (HCC)   . HTN (hypertension)   . HLD (hyperlipidemia)   . Atrial fibrillation (HCC)     tachy-brady syndrome with <1% recurrent PAF since pacemaker placement  . H/O: stroke   . Pulmonary embolism (HCC)     HISTORY OF, the pt. had a recurrent bilateral pulmonary emboli in 2005, on warfarin therapy and at which time she under went implantation of IVC filter  . Anemia     Acute blood loss  . Dementia     Without behavioral disturbance  . Gastroesophageal reflux disease   . Aortic stenosis 10/13/2012    Low EF, low  gradient with severe aortic stenosis confirmed by dobutamine stress echocardiogram s/p TAVR 12/2012  . PONV (postoperative nausea and vomiting)   . Osteoarthritis   . Neuropathy (HCC)   . Spinal stenosis   . Symptomatic bradycardia 2012    s/p Medtronic PPM  . Chronic combined systolic and diastolic CHF (congestive heart failure) (HCC)   . Fibromyalgia   . Presence of permanent cardiac pacemaker   . Shortness of breath dyspnea     with exertion   . Pneumonia     hx of x 3   . Acute on chronic renal failure [facility])     sees Dr Allena Katz   . CKD (chronic kidney disease)   . Headache     hx of migraines   . On home oxygen therapy     patient uses at nite- 2L- has not used in > 6 months per patient  . Dysrhythmia   . Heart murmur   . Stroke (HCC)   . Asthma   . History of bronchitis   . Cataracts, bilateral   . Hard of hearing   . H/O dizziness   . Repeated falls   . Hypothyroidism   . History of kidney stones   . Urinary incontinence   . History of urinary  tract infection   . H/O urinary frequency   . Urinary urgency   . History of blood transfusion   . Aortic regurgitation   . Sciatica   . Sleep apnea     uses oxygen at night and PRN- not used since > 6 months / DOES NOT USE  C-PAP  . Osteoporosis   . Rupture of right patellar tendon   . Coronary artery disease involving native coronary artery of native heart      CURRENT MEDICATIONS: Reviewed  Patient's Medications  New Prescriptions   No medications on file  Previous Medications   ALENDRONATE (FOSAMAX) 70 MG/75ML SOLUTION    Take 70 mg by mouth every 7 (seven) days. Take with a full glass of water on an empty stomach on Saturday   AMIODARONE (PACERONE) 100 MG TABLET    Take 100 mg by mouth daily.   ATORVASTATIN (LIPITOR) 10 MG TABLET    Take 10 mg by mouth daily.   BIOTIN 5000 PO    Take 5,000 mcg by mouth every evening.    CALCITRIOL (ROCALTROL) 0.25 MCG CAPSULE    Take 0.25 mcg by mouth every morning.    CARVEDILOL  (COREG) 12.5 MG TABLET    Take 1 tablet by mouth 2 (two) times daily.   CETIRIZINE (ZYRTEC) 10 MG TABLET    Take 10 mg by mouth every morning.    CHOLECALCIFEROL (VITAMIN D-3) 1000 UNITS CAPS    Take 2 capsules by mouth. Take 2 capsules to = 2000 units PO QHS   CYCLOSPORINE (RESTASIS) 0.05 % OPHTHALMIC EMULSION    Place 1 drop into both eyes 2 (two) times daily as needed (dry eyes.).   DOCUSATE SODIUM (COLACE) 100 MG CAPSULE    Take 1 capsule (100 mg total) by mouth 2 (two) times daily.   DONEPEZIL (ARICEPT) 10 MG TABLET    Take 10 mg by mouth at bedtime.    DULOXETINE (CYMBALTA) 60 MG CAPSULE    Take 60 mg by mouth every evening.    FERROUS SULFATE 325 (65 FE) MG TABLET    Take 325 mg by mouth 3 (three) times daily after meals.   HYDRALAZINE (APRESOLINE) 10 MG TABLET    Take 10 mg by mouth 3 (three) times daily.    HYDROCODONE-ACETAMINOPHEN (NORCO) 7.5-325 MG TABLET    Take 1-2 tablets by mouth every 4 (four) hours as needed for moderate pain.   IPRATROPIUM-ALBUTEROL (DUONEB) 0.5-2.5 (3) MG/3ML SOLN    Take 3 mLs by nebulization daily as needed.    LEVOTHYROXINE (SYNTHROID, LEVOTHROID) 112 MCG TABLET    Take 112 mcg by mouth daily.    LINACLOTIDE (LINZESS) 145 MCG CAPS CAPSULE    Take 145 mcg by mouth every other day. Alternates with the 290 mg tablet in the am.   LINACLOTIDE 290 MCG CAPS    Take 290 mcg by mouth every other day. Alternates with the 145 mg tablets in the morning.   LINIMENTS (SALONPAS ARTHRITIS PAIN RELIEF EX)    Apply 1 patch topically 2 (two) times daily. Monitor for rash or skin irritation   MEMANTINE (NAMENDA) 10 MG TABLET    Take 10 mg by mouth 2 (two) times daily.    MULTIPLE VITAMIN (MULTIVITAMIN) TABLET    Take 0.5 tablets by mouth daily. Give 1/2 tablet PO QAM for supplement   NITROGLYCERIN (NITROSTAT) 0.4 MG SL TABLET    Place 0.4 mg under the tongue every 5 (five) minutes as needed for chest pain (  MAX 3 TABLETS).    OMEPRAZOLE (PRILOSEC) 20 MG CAPSULE    Take 20 mg by  mouth daily.   POLYETHYLENE GLYCOL (MIRALAX / GLYCOLAX) PACKET    Take 17 g by mouth 2 (two) times daily.   PREGABALIN (LYRICA) 75 MG CAPSULE    Take 75 mg by mouth 2 (two) times daily. Give at 9AM and 9PM   PROTEIN (PROCEL) POWD    Take 2 scoop by mouth 2 (two) times daily.   SIMETHICONE (MYLICON) 80 MG CHEWABLE TABLET    Chew 80 mg by mouth 2 (two) times daily as needed for flatulence.    TIZANIDINE (ZANAFLEX) 4 MG TABLET    Take 1 tablet (4 mg total) by mouth every 6 (six) hours as needed for muscle spasms.   TORSEMIDE (DEMADEX) 20 MG TABLET    Take 20 mg by mouth daily.    TURMERIC 500 MG CAPS    Take 1 capsule by mouth 2 (two) times daily.   ULORIC 40 MG TABLET    Take 40 mg by mouth every evening.    VITAMIN B-12 (CYANOCOBALAMIN) 1000 MCG TABLET    Take 1,000 mcg by mouth 2 (two) times a week. Tuesday and Friday   WARFARIN (COUMADIN) 2.5 MG TABLET    Take 2.5 mg by mouth daily.  Modified Medications   No medications on file  Discontinued Medications   WARFARIN (COUMADIN) 4 MG TABLET    Take 4 mg by mouth. 4 mg every Friday   WARFARIN SODIUM (COUMADIN PO)    Take 3 mg by mouth. 3 mg daily except for Fridays     Allergies  Allergen Reactions  . Nubain [Nalbuphine Hcl] Hives    Went into cardiac arrest   . Codeine Hives and Nausea Only  . Darvocet [Propoxyphene N-Acetaminophen] Nausea Only     REVIEW OF SYSTEMS:  GENERAL: no change in appetite, no fatigue, no weight changes, no fever, chills or weakness EYES: Denies change in vision, dry eyes, eye pain, itching or discharge EARS: Denies change in hearing, ringing in ears, or earache NOSE: Denies nasal congestion or epistaxis MOUTH and THROAT: Denies oral discomfort, gingival pain or bleeding, pain from teeth or hoarseness   RESPIRATORY: no cough, SOB, DOE, wheezing, hemoptysis CARDIAC: no chest pain, edema or palpitations GI: no abdominal pain, diarrhea, constipation, heart burn, nausea or vomiting GU: Denies dysuria,  frequency, hematuria, incontinence, or discharge PSYCHIATRIC: Denies feeling of depression or anxiety. No report of hallucinations, insomnia, paranoia, or agitation   PHYSICAL EXAMINATION  GENERAL APPEARANCE: Well nourished. In no acute distress. Obese SKIN:  Skin is warm and dry HEAD: Normal in size and contour. No evidence of trauma EYES: Lids open and close normally. No blepharitis, entropion or ectropion. PERRL. Conjunctivae are clear and sclerae are white. Lenses are without opacity EARS: Pinnae are normal. Patient hears normal voice tunes of the examiner MOUTH and THROAT: Lips are without lesions. Oral mucosa is moist and without lesions. Tongue is normal in shape, size, and color and without lesions NECK: supple, trachea midline, no neck masses, no thyroid tenderness, no thyromegaly LYMPHATICS: no LAN in the neck, no supraclavicular LAN RESPIRATORY: breathing is even & unlabored, BS CTAB CARDIAC: Irregularly irregular, no murmur,no extra heart sounds, no edema GI: abdomen soft, normal BS, no masses, no tenderness, no hepatomegaly, no splenomegaly EXTREMITIES: : Able to move X 4 extremities PSYCHIATRIC: Alert and oriented X 3. Affect and behavior are appropriate  LABS/RADIOLOGY: Labs reviewed: Basic Metabolic Panel:  Recent Labs  06/27/15 0403 06/28/15 1221  06/29/15 0334  09/06/15 10/01/15 10/08/15  NA 142 141  < > 136  < > 139 143 146  K 4.3 4.3  --  4.0  < > 3.8 3.7 3.8  CL 105 106  --  102  --   --   --   --   CO2 28 27  --  26  --   --   --   --   GLUCOSE 104* 106*  --  106*  --   --   --   --   BUN 61* 76*  < > 83*  < > 56* 55* 46*  CREATININE 3.33* 3.37*  < > 2.93*  < > 2.0* 2.4* 2.0*  CALCIUM 8.8* 9.0  --  8.6*  --   --   --   --   < > = values in this interval not displayed. Liver Function Tests:  Recent Labs  12/15/14 0127 07/03/15  AST 48* 15  ALT 32 4*  ALKPHOS 68 70  BILITOT 0.7  --   PROT 5.7*  --   ALBUMIN 3.2*  --    CBC:  Recent Labs   06/27/15 0403 06/28/15 1221  06/29/15 0334 07/03/15 07/13/15 08/29/15  WBC 7.1 7.2  < > 6.4 5.7 6.4 4.5  NEUTROABS  --   --   --   --  HGB 9.4* 10.1*  --  9.4* 9.1* 9.9* 10.6*  HCT 30.2* 31.6*  --  28.1* 28* 30* 34*  MCV 93.5 92.9  --  87.5  --   --   --   PLT 209 208  --  200 229 292 176  < > = values in this interval not displayed.  CBG:  Recent Labs  12/07/14 1211 12/15/14 0737  GLUCAP 144* 104*     ASSESSMENT/PLAN:   Hypertension - increase Hydralazine to 25 mg 1 tab PO TID and continue Coreg 12.5 mg PO BID; BP/HR Q shift X 1 week; BMP in 1 week  Ostearthritis, multiple joints - start Biofreeze 4% gel topically to back of neck, lower back and right leg BID    Kenard Gower, NP [employer] [phone]

## 2015-10-19 DIAGNOSIS — M25661 Stiffness of right knee, not elsewhere classified: Secondary | ICD-10-CM | POA: Diagnosis not present

## 2015-10-19 DIAGNOSIS — M6281 Muscle weakness (generalized): Secondary | ICD-10-CM | POA: Diagnosis not present

## 2015-10-22 DIAGNOSIS — M6281 Muscle weakness (generalized): Secondary | ICD-10-CM | POA: Diagnosis not present

## 2015-10-22 DIAGNOSIS — M25661 Stiffness of right knee, not elsewhere classified: Secondary | ICD-10-CM | POA: Diagnosis not present

## 2015-10-23 DIAGNOSIS — M25661 Stiffness of right knee, not elsewhere classified: Secondary | ICD-10-CM | POA: Diagnosis not present

## 2015-10-23 DIAGNOSIS — M6281 Muscle weakness (generalized): Secondary | ICD-10-CM | POA: Diagnosis not present

## 2015-10-24 DIAGNOSIS — M6281 Muscle weakness (generalized): Secondary | ICD-10-CM | POA: Diagnosis not present

## 2015-10-24 DIAGNOSIS — M25661 Stiffness of right knee, not elsewhere classified: Secondary | ICD-10-CM | POA: Diagnosis not present

## 2015-10-25 ENCOUNTER — Encounter: Payer: Self-pay | Admitting: Adult Health

## 2015-10-25 ENCOUNTER — Non-Acute Institutional Stay (SKILLED_NURSING_FACILITY): Payer: Medicare Other | Admitting: Adult Health

## 2015-10-25 DIAGNOSIS — K219 Gastro-esophageal reflux disease without esophagitis: Secondary | ICD-10-CM | POA: Diagnosis not present

## 2015-10-25 DIAGNOSIS — I10 Essential (primary) hypertension: Secondary | ICD-10-CM

## 2015-10-25 DIAGNOSIS — G629 Polyneuropathy, unspecified: Secondary | ICD-10-CM

## 2015-10-25 DIAGNOSIS — E785 Hyperlipidemia, unspecified: Secondary | ICD-10-CM

## 2015-10-25 DIAGNOSIS — I480 Paroxysmal atrial fibrillation: Secondary | ICD-10-CM | POA: Diagnosis not present

## 2015-10-25 DIAGNOSIS — I25119 Atherosclerotic heart disease of native coronary artery with unspecified angina pectoris: Secondary | ICD-10-CM

## 2015-10-25 DIAGNOSIS — S86811S Strain of other muscle(s) and tendon(s) at lower leg level, right leg, sequela: Secondary | ICD-10-CM

## 2015-10-25 DIAGNOSIS — D62 Acute posthemorrhagic anemia: Secondary | ICD-10-CM | POA: Diagnosis not present

## 2015-10-25 DIAGNOSIS — I5032 Chronic diastolic (congestive) heart failure: Secondary | ICD-10-CM | POA: Diagnosis not present

## 2015-10-25 DIAGNOSIS — F0390 Unspecified dementia without behavioral disturbance: Secondary | ICD-10-CM

## 2015-10-25 DIAGNOSIS — N183 Chronic kidney disease, stage 3 unspecified: Secondary | ICD-10-CM

## 2015-10-25 DIAGNOSIS — J309 Allergic rhinitis, unspecified: Secondary | ICD-10-CM

## 2015-10-25 DIAGNOSIS — M1A9XX0 Chronic gout, unspecified, without tophus (tophi): Secondary | ICD-10-CM

## 2015-10-25 DIAGNOSIS — E43 Unspecified severe protein-calorie malnutrition: Secondary | ICD-10-CM | POA: Diagnosis not present

## 2015-10-25 DIAGNOSIS — M25661 Stiffness of right knee, not elsewhere classified: Secondary | ICD-10-CM | POA: Diagnosis not present

## 2015-10-25 DIAGNOSIS — M810 Age-related osteoporosis without current pathological fracture: Secondary | ICD-10-CM

## 2015-10-25 DIAGNOSIS — M6281 Muscle weakness (generalized): Secondary | ICD-10-CM | POA: Diagnosis not present

## 2015-10-25 DIAGNOSIS — E039 Hypothyroidism, unspecified: Secondary | ICD-10-CM | POA: Diagnosis not present

## 2015-10-25 NOTE — Progress Notes (Signed)
Patient ID: [name], female   DOB: Sep 13, 1932, 82 y.o.   MRN: [id number]    DATE:    10/25/15  MRN:  [id number]  BIRTHDAY: 1932-11-09  Facility:  Nursing Home Location:  [facility]  Nursing Home Room Number: 905-B  LEVEL OF CARE:  SNF [phone])  Contact Information    Name Relation Home Work [place] [phone]  [phone]   Gray,Sharon Daughter [phone]  [phone]       Code Status History    Date Active Date Inactive Code Status Order ID Comments User Context   03/08/2015  8:25 PM 03/12/2015  5:09 PM Full Code [id number]  Lanney Gins, PA-C Inpatient   12/15/2014 12:39 AM 12/15/2014  6:36 PM Full Code [id number]  Yevonne Pax, MD Inpatient   12/04/2014  4:00 PM 12/07/2014  5:13 PM Full Code [id number]  Lanney Gins, PA-C Inpatient   12/21/2012  3:56 PM 12/28/2012  7:34 PM DNR [id number]  Tonny Bollman, MD Inpatient      Chief Complaint  Patient presents with  . Medical management of chronic illnesses    HISTORY OF PRESENT ILLNESS:  This is an 82 year old female who is being seen for a routine visit. She is currently having a short-term rehabilitation @ [facility]. Her Hydralazine dosage has recently increased to 25 mg TID. Her CAD is stable. No complaints of chest pain.  PAST MEDICAL HISTORY:  Past Medical History  Diagnosis Date  . Syncope   . Symptomatic bradycardia   . Chronic lower back pain   . Nonischemic cardiomyopathy (HCC)   . HTN (hypertension)   . HLD (hyperlipidemia)   . Atrial fibrillation (HCC)     tachy-brady syndrome with <1% recurrent PAF since pacemaker placement  . H/O: stroke   . Pulmonary embolism (HCC)     HISTORY OF, the pt. had a recurrent bilateral pulmonary emboli in 2005, on warfarin therapy and at which time she under went implantation of IVC filter  . Anemia     Acute blood loss  . Dementia     Without behavioral disturbance  . Gastroesophageal reflux disease   . Aortic stenosis  10/13/2012    Low EF, low gradient with severe aortic stenosis confirmed by dobutamine stress echocardiogram s/p TAVR 12/2012  . PONV (postoperative nausea and vomiting)   . Osteoarthritis   . Neuropathy (HCC)   . Spinal stenosis   . Symptomatic bradycardia 2012    s/p Medtronic PPM  . Chronic combined systolic and diastolic CHF (congestive heart failure) (HCC)   . Fibromyalgia   . Presence of permanent cardiac pacemaker   . Shortness of breath dyspnea     with exertion   . Pneumonia     hx of x 3   . Acute on chronic renal failure [facility])     sees Dr Allena Katz   . CKD (chronic kidney disease)   . Headache     hx of migraines   . On home oxygen therapy     patient uses at nite- 2L- has not used in > 6 months per patient  . Dysrhythmia   . Heart murmur   . Stroke (HCC)   . Asthma   . History of bronchitis   . Cataracts, bilateral   . Hard of hearing   . H/O dizziness   . Repeated falls   . Hypothyroidism   . History of kidney stones   . Urinary incontinence   . History  of urinary tract infection   . H/O urinary frequency   . Urinary urgency   . History of blood transfusion   . Aortic regurgitation   . Sciatica   . Sleep apnea     uses oxygen at night and PRN- not used since > 6 months / DOES NOT USE  C-PAP  . Osteoporosis   . Rupture of right patellar tendon   . Coronary artery disease involving native coronary artery of native heart      CURRENT MEDICATIONS: Reviewed  Patient's Medications  New Prescriptions   No medications on file  Previous Medications   ALENDRONATE (FOSAMAX) 70 MG/75ML SOLUTION    Take 70 mg by mouth every 7 (seven) days. Take with a full glass of water on an empty stomach on Saturday   AMIODARONE (PACERONE) 100 MG TABLET    Take 100 mg by mouth daily.   ATORVASTATIN (LIPITOR) 10 MG TABLET    Take 10 mg by mouth daily.   BIOTIN 5000 PO    Take 5,000 mcg by mouth every evening.    CALCITRIOL (ROCALTROL) 0.25 MCG CAPSULE    Take 0.25 mcg by mouth  every morning.    CARVEDILOL (COREG) 12.5 MG TABLET    Take 1 tablet by mouth 2 (two) times daily.   CETIRIZINE (ZYRTEC) 10 MG TABLET    Take 10 mg by mouth every morning.    CHOLECALCIFEROL (VITAMIN D-3) 1000 UNITS CAPS    Take 2 capsules by mouth. Take 2 capsules to = 2000 units PO QHS   CYCLOSPORINE (RESTASIS) 0.05 % OPHTHALMIC EMULSION    Place 1 drop into both eyes 2 (two) times daily as needed (dry eyes.).   DOCUSATE SODIUM (COLACE) 100 MG CAPSULE    Take 1 capsule (100 mg total) by mouth 2 (two) times daily.   DONEPEZIL (ARICEPT) 10 MG TABLET    Take 10 mg by mouth at bedtime.    DULOXETINE (CYMBALTA) 60 MG CAPSULE    Take 60 mg by mouth every evening.    FERROUS SULFATE 325 (65 FE) MG TABLET    Take 325 mg by mouth 3 (three) times daily after meals.   HYDRALAZINE (APRESOLINE) 25 MG TABLET    Take 25 mg by mouth 3 (three) times daily.   HYDROCODONE-ACETAMINOPHEN (NORCO) 7.5-325 MG TABLET    Take 1-2 tablets by mouth every 4 (four) hours as needed for moderate pain.   IPRATROPIUM-ALBUTEROL (DUONEB) 0.5-2.5 (3) MG/3ML SOLN    Take 3 mLs by nebulization daily as needed.    LEVOTHYROXINE (SYNTHROID, LEVOTHROID) 112 MCG TABLET    Take 112 mcg by mouth daily.    LINACLOTIDE (LINZESS) 145 MCG CAPS CAPSULE    Take 145 mcg by mouth every other day. Alternates with the 290 mg tablet in the am.   LINACLOTIDE 290 MCG CAPS    Take 290 mcg by mouth every other day. Alternates with the 145 mg tablets in the morning.   LINIMENTS (SALONPAS ARTHRITIS PAIN RELIEF EX)    Apply 1 patch topically 2 (two) times daily. Monitor for rash or skin irritation   MEMANTINE (NAMENDA) 10 MG TABLET    Take 10 mg by mouth 2 (two) times daily.    MENTHOL, TOPICAL ANALGESIC, (BIOFREEZE) 4 % GEL    Apply 1 application topically 2 (two) times daily. Apply to back of neck, lower back, and right leg   MULTIPLE VITAMIN (MULTIVITAMIN) TABLET    Take 0.5 tablets by mouth daily. Give 1/2  tablet PO QAM for supplement   NITROGLYCERIN  (NITROSTAT) 0.4 MG SL TABLET    Place 0.4 mg under the tongue every 5 (five) minutes as needed for chest pain (MAX 3 TABLETS).    OMEPRAZOLE (PRILOSEC) 20 MG CAPSULE    Take 20 mg by mouth daily.   POLYETHYLENE GLYCOL (MIRALAX / GLYCOLAX) PACKET    Take 17 g by mouth 2 (two) times daily.   PREGABALIN (LYRICA) 75 MG CAPSULE    Take 75 mg by mouth 2 (two) times daily. Give at 9AM and 9PM   PROTEIN (PROCEL) POWD    Take 2 scoop by mouth 2 (two) times daily.   SIMETHICONE (MYLICON) 80 MG CHEWABLE TABLET    Chew 80 mg by mouth 2 (two) times daily as needed for flatulence.    TIZANIDINE (ZANAFLEX) 4 MG TABLET    Take 1 tablet (4 mg total) by mouth every 6 (six) hours as needed for muscle spasms.   TORSEMIDE (DEMADEX) 20 MG TABLET    Take 20 mg by mouth daily.    TURMERIC 500 MG CAPS    Take 1 capsule by mouth 2 (two) times daily.   ULORIC 40 MG TABLET    Take 40 mg by mouth every evening.    VITAMIN B-12 (CYANOCOBALAMIN) 1000 MCG TABLET    Take 1,000 mcg by mouth 2 (two) times a week. Tuesday and Friday   WARFARIN (COUMADIN) 2 MG TABLET    Take 2 mg by mouth daily.  Modified Medications   No medications on file  Discontinued Medications   HYDRALAZINE (APRESOLINE) 10 MG TABLET    Take 10 mg by mouth 3 (three) times daily.    WARFARIN (COUMADIN) 2.5 MG TABLET    Take 2.5 mg by mouth daily.     Allergies  Allergen Reactions  . Nubain [Nalbuphine Hcl] Hives    Went into cardiac arrest   . Codeine Hives and Nausea Only  . Darvocet [Propoxyphene N-Acetaminophen] Nausea Only     REVIEW OF SYSTEMS:  GENERAL: no change in appetite, no fatigue, no weight changes, no fever, chills or weakness EYES: Denies change in vision, dry eyes, eye pain, itching or discharge EARS: Denies change in hearing, ringing in ears, or earache NOSE: Denies nasal congestion or epistaxis MOUTH and THROAT: Denies oral discomfort, gingival pain or bleeding, pain from teeth or hoarseness   RESPIRATORY: no cough, SOB, DOE,  wheezing, hemoptysis CARDIAC: no chest pain, edema or palpitations GI: no abdominal pain, diarrhea, constipation, heart burn, nausea or vomiting GU: Denies dysuria, frequency, hematuria, incontinence, or discharge PSYCHIATRIC: Denies feeling of depression or anxiety. No report of hallucinations, insomnia, paranoia, or agitation   PHYSICAL EXAMINATION  GENERAL APPEARANCE: Well nourished. In no acute distress. Obese SKIN:  Right heel wound is moist, no erythema , with foam dressing HEAD: Normal in size and contour. No evidence of trauma EYES: Lids open and close normally. No blepharitis, entropion or ectropion. PERRL. Conjunctivae are clear and sclerae are white. Lenses are without opacity EARS: Pinnae are normal. Patient hears normal voice tunes of the examiner MOUTH and THROAT: Lips are without lesions. Oral mucosa is moist and without lesions. Tongue is normal in shape, size, and color and without lesions NECK: supple, trachea midline, no neck masses, no thyroid tenderness, no thyromegaly LYMPHATICS: no LAN in the neck, no supraclavicular LAN RESPIRATORY: breathing is even & unlabored, BS CTAB CARDIAC: Irregularly irregular, no murmur,no extra heart sounds, no edema GI: abdomen soft, normal BS, no  masses, no tenderness, no hepatomegaly, no splenomegaly EXTREMITIES: : Able to move X 4 extremities; has right knee brace PSYCHIATRIC: Alert and oriented X 3. Affect and behavior are appropriate  LABS/RADIOLOGY: Labs reviewed: Basic Metabolic Panel:  Recent Labs  16/10/96 0403 06/28/15 1221  06/29/15 0334  09/06/15 10/01/15 10/08/15  NA 142 141  < > 136  < > 139 143 146  K 4.3 4.3  --  4.0  < > 3.8 3.7 3.8  CL 105 106  --  102  --   --   --   --   CO2 28 27  --  26  --   --   --   --   GLUCOSE 104* 106*  --  106*  --   --   --   --   BUN 61* 76*  < > 83*  < > 56* 55* 46*  CREATININE 3.33* 3.37*  < > 2.93*  < > 2.0* 2.4* 2.0*  CALCIUM 8.8* 9.0  --  8.6*  --   --   --   --   < > =  values in this interval not displayed. Liver Function Tests:  Recent Labs  12/15/14 0127 07/03/15  AST 48* 15  ALT 32 4*  ALKPHOS 68 70  BILITOT 0.7  --   PROT 5.7*  --   ALBUMIN 3.2*  --    CBC:  Recent Labs  06/27/15 0403 06/28/15 1221  06/29/15 0334 07/03/15 07/13/15 08/29/15  WBC 7.1 7.2  < > 6.4 5.7 6.4 4.5  NEUTROABS  --   --   --   --  HGB 9.4* 10.1*  --  9.4* 9.1* 9.9* 10.6*  HCT 30.2* 31.6*  --  28.1* 28* 30* 34*  MCV 93.5 92.9  --  87.5  --   --   --   PLT 209 208  --  200 229 292 176  < > = values in this interval not displayed.  CBG:  Recent Labs  12/07/14 1211 12/15/14 0737  GLUCAP 144* 104*     ASSESSMENT/PLAN:   Right patella tendon rupture S/P repair - continue rehabilitation;  RLE  WBAT; follow-up with Dr. Charlann Boxer, orthopedics; fall precautions; continue Coumadin for DVT prophylaxis; Norco 7.5/325mg  1-2 tabs Q 4 hours PRN for pain; Tizanidine 4 mg 1 tab PO Q 6 hours PRN for muscle spasm  Hypothyroidism - continue Synthroid 112 g 1 tab by mouth daily Lab Results  Component Value Date   TSH 6.001* 12/15/2014    Atrial fibrillation - rate controlled. continue coreg 12.5 mg bid, amiodarone 100 mg daily and Coumadin  CAD - stable; continue coreg, hydralazine, prn NTG, Atorvastatin 10 mg daily   Blood loss anemia - decrease FeSO4 325 mg to 1 tab PO daily; check CBC in 2 weeks  Lab Results  Component Value Date   WBC 4.5 08/29/2015   HGB 10.6* 08/29/2015   HCT 34* 08/29/2015   MCV 87.5 06/29/2015   PLT 176 08/29/2015    Protein-calorie malnutrition, severe - albumin 2.98; continue Procel 2 scoops BID  Osteoporosis - Continue weekly fosamax and calcitriol for now. Fall precautions  Allergic rhinitis - continue cetirizine 10 mg daily   GERD - stable, continue prilosec 20 mg daily  Hypertension with renal disease - continue hydralazine 10 mg tid with coreg 12.5 mg bid  CKD stage 3 - stable  Lab Results  Component Value Date    CREATININE 2.0* 10/08/2015  Dementia - Continue donepezil  10 mg 1 tab by mouth daily at bedtime and memantine 10 mg 1 tab by mouth twice a day  Chronic constipation - continue Colace 100 mg bid, Linzess 145 MCG alternating with 290 mcg daily and miralax bid.  CHF - no SOB; continue torsemide 20 mg 1 tab by mouth daily, coreg 12.5 mg 1 tab by mouth twice a day and hydralazine 25 mg 1 tab by mouth 3 times a day  Neuropathy - continue Lyrica 75 mg bid, Salonpas patch BID and cymbalta 60 mg daily  Hyperlipidemia - continue Atorvastatin 10 mg daily Lab Results  Component Value Date   CHOL 153 10/01/2015   HDL 36 10/01/2015   LDLCALC 67 10/01/2015   TRIG 247* 10/01/2015   CHOLHDL 1.8 06/16/2010   Gout - continue Uloric 40 mg 1 tab by mouth every evening     Goals of care:  Short-term rehabilitation    Kenard Gower, NP [facility] [phone]

## 2015-10-26 ENCOUNTER — Encounter: Payer: Self-pay | Admitting: Adult Health

## 2015-10-26 ENCOUNTER — Non-Acute Institutional Stay (SKILLED_NURSING_FACILITY): Payer: Medicare Other | Admitting: Adult Health

## 2015-10-26 DIAGNOSIS — I480 Paroxysmal atrial fibrillation: Secondary | ICD-10-CM

## 2015-10-26 DIAGNOSIS — Z7901 Long term (current) use of anticoagulants: Secondary | ICD-10-CM

## 2015-10-26 DIAGNOSIS — M6281 Muscle weakness (generalized): Secondary | ICD-10-CM | POA: Diagnosis not present

## 2015-10-26 DIAGNOSIS — M25661 Stiffness of right knee, not elsewhere classified: Secondary | ICD-10-CM | POA: Diagnosis not present

## 2015-10-26 NOTE — Progress Notes (Signed)
Patient ID: [name], female   DOB: July 08, 1932, 82 y.o.   MRN: [id number] Subjective:     Indication: atrial fibrillation Bleeding signs/symptoms: None Thromboembolic signs/symptoms: None  Missed Coumadin doses: None Medication changes: no Dietary changes: no Bacterial/viral infection: no Other concerns: no  The following portions of the patient's history were reviewed and updated as appropriate: allergies, current medications, past family history, past medical history, past social history, past surgical history and problem list.  Review of Systems A comprehensive review of systems was negative.   Objective:    INR Today: 1.4 Current dose: Coumadin 2 mg      Assessment:    Subtherapeutic INR for goal of 2-3   Plan:    1. New dose: start Coumadin 2.5 mg PO Q M_W_F_Sun; Coumadin 2 mg PO Q T-Th_Sat   2. Next INR: 11/01/15

## 2015-10-29 DIAGNOSIS — M25661 Stiffness of right knee, not elsewhere classified: Secondary | ICD-10-CM | POA: Diagnosis not present

## 2015-10-29 DIAGNOSIS — M6281 Muscle weakness (generalized): Secondary | ICD-10-CM | POA: Diagnosis not present

## 2015-10-30 DIAGNOSIS — M25661 Stiffness of right knee, not elsewhere classified: Secondary | ICD-10-CM | POA: Diagnosis not present

## 2015-10-30 DIAGNOSIS — M6281 Muscle weakness (generalized): Secondary | ICD-10-CM | POA: Diagnosis not present

## 2015-10-31 DIAGNOSIS — M25661 Stiffness of right knee, not elsewhere classified: Secondary | ICD-10-CM | POA: Diagnosis not present

## 2015-10-31 DIAGNOSIS — M6281 Muscle weakness (generalized): Secondary | ICD-10-CM | POA: Diagnosis not present

## 2015-11-01 DIAGNOSIS — D649 Anemia, unspecified: Secondary | ICD-10-CM | POA: Diagnosis not present

## 2015-11-01 DIAGNOSIS — M25661 Stiffness of right knee, not elsewhere classified: Secondary | ICD-10-CM | POA: Diagnosis not present

## 2015-11-01 DIAGNOSIS — M6281 Muscle weakness (generalized): Secondary | ICD-10-CM | POA: Diagnosis not present

## 2015-11-02 DIAGNOSIS — M6281 Muscle weakness (generalized): Secondary | ICD-10-CM | POA: Diagnosis not present

## 2015-11-02 DIAGNOSIS — M25661 Stiffness of right knee, not elsewhere classified: Secondary | ICD-10-CM | POA: Diagnosis not present

## 2015-11-05 DIAGNOSIS — M25661 Stiffness of right knee, not elsewhere classified: Secondary | ICD-10-CM | POA: Diagnosis not present

## 2015-11-05 DIAGNOSIS — M6281 Muscle weakness (generalized): Secondary | ICD-10-CM | POA: Diagnosis not present

## 2015-11-06 DIAGNOSIS — M6281 Muscle weakness (generalized): Secondary | ICD-10-CM | POA: Diagnosis not present

## 2015-11-06 DIAGNOSIS — M25661 Stiffness of right knee, not elsewhere classified: Secondary | ICD-10-CM | POA: Diagnosis not present

## 2015-11-07 DIAGNOSIS — M6281 Muscle weakness (generalized): Secondary | ICD-10-CM | POA: Diagnosis not present

## 2015-11-07 DIAGNOSIS — M25661 Stiffness of right knee, not elsewhere classified: Secondary | ICD-10-CM | POA: Diagnosis not present

## 2015-11-08 DIAGNOSIS — M6281 Muscle weakness (generalized): Secondary | ICD-10-CM | POA: Diagnosis not present

## 2015-11-08 DIAGNOSIS — M25661 Stiffness of right knee, not elsewhere classified: Secondary | ICD-10-CM | POA: Diagnosis not present

## 2015-11-09 DIAGNOSIS — M6281 Muscle weakness (generalized): Secondary | ICD-10-CM | POA: Diagnosis not present

## 2015-11-09 DIAGNOSIS — M25661 Stiffness of right knee, not elsewhere classified: Secondary | ICD-10-CM | POA: Diagnosis not present

## 2015-11-12 DIAGNOSIS — M6281 Muscle weakness (generalized): Secondary | ICD-10-CM | POA: Diagnosis not present

## 2015-11-12 DIAGNOSIS — M25661 Stiffness of right knee, not elsewhere classified: Secondary | ICD-10-CM | POA: Diagnosis not present

## 2015-11-13 DIAGNOSIS — M25661 Stiffness of right knee, not elsewhere classified: Secondary | ICD-10-CM | POA: Diagnosis not present

## 2015-11-13 DIAGNOSIS — M6281 Muscle weakness (generalized): Secondary | ICD-10-CM | POA: Diagnosis not present

## 2015-11-16 DIAGNOSIS — B351 Tinea unguium: Secondary | ICD-10-CM | POA: Diagnosis not present

## 2015-11-16 DIAGNOSIS — M79671 Pain in right foot: Secondary | ICD-10-CM | POA: Diagnosis not present

## 2015-11-16 DIAGNOSIS — M79672 Pain in left foot: Secondary | ICD-10-CM | POA: Diagnosis not present

## 2015-11-19 DIAGNOSIS — R300 Dysuria: Secondary | ICD-10-CM | POA: Diagnosis not present

## 2015-11-19 DIAGNOSIS — D631 Anemia in chronic kidney disease: Secondary | ICD-10-CM | POA: Diagnosis not present

## 2015-11-19 DIAGNOSIS — I129 Hypertensive chronic kidney disease with stage 1 through stage 4 chronic kidney disease, or unspecified chronic kidney disease: Secondary | ICD-10-CM | POA: Diagnosis not present

## 2015-11-19 DIAGNOSIS — N2581 Secondary hyperparathyroidism of renal origin: Secondary | ICD-10-CM | POA: Diagnosis not present

## 2015-11-19 DIAGNOSIS — N184 Chronic kidney disease, stage 4 (severe): Secondary | ICD-10-CM | POA: Diagnosis not present

## 2015-11-21 ENCOUNTER — Other Ambulatory Visit: Payer: Self-pay | Admitting: *Deleted

## 2015-11-21 MED ORDER — HYDROCODONE-ACETAMINOPHEN 7.5-325 MG PO TABS: ORAL_TABLET | ORAL | 0 refills | Status: DC

## 2015-11-21 NOTE — Telephone Encounter (Signed)
[facility] #[phone] Fax: [phone] 

## 2015-11-23 ENCOUNTER — Non-Acute Institutional Stay (SKILLED_NURSING_FACILITY): Payer: Medicare Other | Admitting: Adult Health

## 2015-11-23 ENCOUNTER — Encounter: Payer: Self-pay | Admitting: Adult Health

## 2015-11-23 DIAGNOSIS — M1A9XX0 Chronic gout, unspecified, without tophus (tophi): Secondary | ICD-10-CM

## 2015-11-23 DIAGNOSIS — J309 Allergic rhinitis, unspecified: Secondary | ICD-10-CM | POA: Diagnosis not present

## 2015-11-23 DIAGNOSIS — F0390 Unspecified dementia without behavioral disturbance: Secondary | ICD-10-CM

## 2015-11-23 DIAGNOSIS — I480 Paroxysmal atrial fibrillation: Secondary | ICD-10-CM | POA: Diagnosis not present

## 2015-11-23 DIAGNOSIS — G629 Polyneuropathy, unspecified: Secondary | ICD-10-CM

## 2015-11-23 DIAGNOSIS — M810 Age-related osteoporosis without current pathological fracture: Secondary | ICD-10-CM | POA: Diagnosis not present

## 2015-11-23 DIAGNOSIS — Z471 Aftercare following joint replacement surgery: Secondary | ICD-10-CM | POA: Diagnosis not present

## 2015-11-23 DIAGNOSIS — K219 Gastro-esophageal reflux disease without esophagitis: Secondary | ICD-10-CM | POA: Diagnosis not present

## 2015-11-23 DIAGNOSIS — I10 Essential (primary) hypertension: Secondary | ICD-10-CM | POA: Diagnosis not present

## 2015-11-23 DIAGNOSIS — E039 Hypothyroidism, unspecified: Secondary | ICD-10-CM

## 2015-11-23 DIAGNOSIS — S86811S Strain of other muscle(s) and tendon(s) at lower leg level, right leg, sequela: Secondary | ICD-10-CM | POA: Diagnosis not present

## 2015-11-23 DIAGNOSIS — E785 Hyperlipidemia, unspecified: Secondary | ICD-10-CM

## 2015-11-23 DIAGNOSIS — K5900 Constipation, unspecified: Secondary | ICD-10-CM

## 2015-11-23 DIAGNOSIS — I25119 Atherosclerotic heart disease of native coronary artery with unspecified angina pectoris: Secondary | ICD-10-CM

## 2015-11-23 DIAGNOSIS — M1712 Unilateral primary osteoarthritis, left knee: Secondary | ICD-10-CM | POA: Diagnosis not present

## 2015-11-23 DIAGNOSIS — I5032 Chronic diastolic (congestive) heart failure: Secondary | ICD-10-CM

## 2015-11-23 DIAGNOSIS — Z96651 Presence of right artificial knee joint: Secondary | ICD-10-CM | POA: Diagnosis not present

## 2015-11-23 DIAGNOSIS — N183 Chronic kidney disease, stage 3 unspecified: Secondary | ICD-10-CM

## 2015-11-23 DIAGNOSIS — K5909 Other constipation: Secondary | ICD-10-CM

## 2015-11-23 NOTE — Progress Notes (Signed)
Patient ID: [name], female   DOB: 04-23-1932, 82 y.o.   MRN: [id number]    DATE:    11/23/15  MRN:  [id number]  BIRTHDAY: [birth year]  Facility:  Nursing Home Location:  [facility]  Nursing Home Room Number: 905-B  LEVEL OF CARE:  SNF [phone])  Contact Information    Name Relation Home Work [place] [phone]  [phone]   Gray,Sharon Daughter [phone]  [phone]       Code Status History    Date Active Date Inactive Code Status Order ID Comments User Context   03/08/2015  8:25 PM 03/12/2015  5:09 PM Full Code [id number]  Lanney Gins, PA-C Inpatient   12/15/2014 12:39 AM 12/15/2014  6:36 PM Full Code [id number]  Yevonne Pax, MD Inpatient   12/04/2014  4:00 PM 12/07/2014  5:13 PM Full Code [id number]  Lanney Gins, PA-C Inpatient   12/21/2012  3:56 PM 12/28/2012  7:34 PM DNR [id number]  Tonny Bollman, MD Inpatient      Chief Complaint  Patient presents with  . Medical management of chronic illnesses    HISTORY OF PRESENT ILLNESS:  This is an 82 year old female who is being seen for a routine visit. She is here fo a long-term care. She has been transitioned to RNP. Her Procel was recently discontinued due to healed wound and has good PO intake. FeSO4 has been discontinued and repeat hgb showed  11.6  . She is seen in her room and complained of constipation.    PAST MEDICAL HISTORY:  Past Medical History:  Diagnosis Date  . Acute on chronic renal failure [facility])    sees Dr Allena Katz   . Anemia    Acute blood loss  . Aortic regurgitation   . Aortic stenosis 10/13/2012   Low EF, low gradient with severe aortic stenosis confirmed by dobutamine stress echocardiogram s/p TAVR 12/2012  . Asthma   . Atrial fibrillation (HCC)    tachy-brady syndrome with <1% recurrent PAF since pacemaker placement  . Cataracts, bilateral   . Chronic combined systolic and diastolic CHF (congestive heart failure) (HCC)   . Chronic lower back pain   .  CKD (chronic kidney disease)   . Coronary artery disease involving native coronary artery of native heart   . Dementia    Without behavioral disturbance  . Dysrhythmia   . Fibromyalgia   . Gastroesophageal reflux disease   . H/O dizziness   . H/O urinary frequency   . H/O: stroke   . Hard of hearing   . Headache    hx of migraines   . Heart murmur   . History of blood transfusion   . History of bronchitis   . History of kidney stones   . History of urinary tract infection   . HLD (hyperlipidemia)   . HTN (hypertension)   . Hypothyroidism   . Neuropathy (HCC)   . Nonischemic cardiomyopathy (HCC)   . On home oxygen therapy    patient uses at nite- 2L- has not used in > 6 months per patient  . Osteoarthritis   . Osteoporosis   . Pneumonia    hx of x 3   . PONV (postoperative nausea and vomiting)   . Presence of permanent cardiac pacemaker   . Pulmonary embolism (HCC)    HISTORY OF, the pt. had a recurrent bilateral pulmonary emboli in 2005, on warfarin therapy and at which time she under went implantation of IVC filter  .  Repeated falls   . Rupture of right patellar tendon   . Sciatica   . Shortness of breath dyspnea    with exertion   . Sleep apnea    uses oxygen at night and PRN- not used since > 6 months / DOES NOT USE  C-PAP  . Spinal stenosis   . Stroke (HCC)   . Symptomatic bradycardia   . Symptomatic bradycardia 2012   s/p Medtronic PPM  . Syncope   . Urinary incontinence   . Urinary urgency      CURRENT MEDICATIONS: Reviewed  Patient's Medications  New Prescriptions   No medications on file  Previous Medications   ALENDRONATE (FOSAMAX) 70 MG/75ML SOLUTION    Take 70 mg by mouth every 7 (seven) days. Take with a full glass of water on an empty stomach on Saturday   AMIODARONE (PACERONE) 100 MG TABLET    Take 100 mg by mouth daily.   ATORVASTATIN (LIPITOR) 10 MG TABLET    Take 10 mg by mouth daily.   BIOTIN 5000 PO    Take 5,000 mcg by mouth every  evening.    CALCITRIOL (ROCALTROL) 0.25 MCG CAPSULE    Take 0.25 mcg by mouth every morning.    CARVEDILOL (COREG) 12.5 MG TABLET    Take 1 tablet by mouth 2 (two) times daily.    CETIRIZINE (ZYRTEC) 10 MG TABLET    Take 10 mg by mouth every morning.    CHOLECALCIFEROL (VITAMIN D-3) 1000 UNITS CAPS    Take 2 capsules by mouth. Take 2 capsules to = 2000 units PO QHS   CYCLOSPORINE (RESTASIS) 0.05 % OPHTHALMIC EMULSION    Place 1 drop into both eyes 2 (two) times daily as needed (dry eyes.).   DOCUSATE SODIUM (COLACE) 100 MG CAPSULE    Take 1 capsule (100 mg total) by mouth 2 (two) times daily.   DONEPEZIL (ARICEPT) 10 MG TABLET    Take 10 mg by mouth at bedtime.    DULOXETINE (CYMBALTA) 60 MG CAPSULE    Take 60 mg by mouth every evening.    FERROUS SULFATE 325 (65 FE) MG TABLET    Take 325 mg by mouth 3 (three) times daily after meals.   HYDRALAZINE (APRESOLINE) 25 MG TABLET    Take 25 mg by mouth 3 (three) times daily.   HYDROCODONE-ACETAMINOPHEN (NORCO) 7.5-325 MG TABLET    Take one to two tablets by mouth every 4 hours as needed for pain. Do not exceed 4gm of Tylenol in 24 hours   IPRATROPIUM-ALBUTEROL (DUONEB) 0.5-2.5 (3) MG/3ML SOLN    Take 3 mLs by nebulization daily as needed.    LEVOTHYROXINE (SYNTHROID, LEVOTHROID) 112 MCG TABLET    Take 112 mcg by mouth daily.    LINACLOTIDE (LINZESS) 145 MCG CAPS CAPSULE    Take 145 mcg by mouth every other day. Alternates with the 290 mg tablet in the am.   LINACLOTIDE 290 MCG CAPS    Take 290 mcg by mouth every other day. Alternates with the 145 mg tablets in the morning.   LINIMENTS (SALONPAS ARTHRITIS PAIN RELIEF EX)    Apply 1 patch topically 2 (two) times daily. Monitor for rash or skin irritation   MEMANTINE (NAMENDA) 10 MG TABLET    Take 10 mg by mouth 2 (two) times daily.    MENTHOL, TOPICAL ANALGESIC, (BIOFREEZE) 4 % GEL    Apply 1 application topically 2 (two) times daily. Apply to back of neck, lower back, and right  leg   MULTIPLE VITAMIN  (MULTIVITAMIN) TABLET    Take 0.5 tablets by mouth daily. Give 1/2 tablet PO QAM for supplement   NITROGLYCERIN (NITROSTAT) 0.4 MG SL TABLET    Place 0.4 mg under the tongue every 5 (five) minutes as needed for chest pain (MAX 3 TABLETS).    OMEPRAZOLE (PRILOSEC) 20 MG CAPSULE    Take 20 mg by mouth daily.   POLYETHYLENE GLYCOL (MIRALAX / GLYCOLAX) PACKET    Take 17 g by mouth 2 (two) times daily.   PREGABALIN (LYRICA) 75 MG CAPSULE    Take 75 mg by mouth 2 (two) times daily. Give at 9AM and 9PM   SIMETHICONE (MYLICON) 80 MG CHEWABLE TABLET    Chew 80 mg by mouth 2 (two) times daily as needed for flatulence.    TIZANIDINE (ZANAFLEX) 4 MG TABLET    Take 1 tablet (4 mg total) by mouth every 6 (six) hours as needed for muscle spasms.   TORSEMIDE (DEMADEX) 20 MG TABLET    Take 20 mg by mouth daily.    TURMERIC 500 MG CAPS    Take 1 capsule by mouth 2 (two) times daily.   ULORIC 40 MG TABLET    Take 40 mg by mouth every evening.    VITAMIN B-12 (CYANOCOBALAMIN) 1000 MCG TABLET    Take 1,000 mcg by mouth 2 (two) times a week. Tuesday and Friday   WARFARIN (COUMADIN) 2 MG TABLET    Take 2 mg by mouth. Take Tue-Thu-Sat   WARFARIN (COUMADIN) 2.5 MG TABLET    Take 2.5 mg by mouth. Take on Mon-Wed-Fri-Sun  Modified Medications   No medications on file  Discontinued Medications   PROTEIN (PROCEL) POWD    Take 2 scoop by mouth 2 (two) times daily.     Allergies  Allergen Reactions  . Nubain [Nalbuphine Hcl] Hives    Went into cardiac arrest   . Codeine Hives and Nausea Only  . Darvocet [Propoxyphene N-Acetaminophen] Nausea Only     REVIEW OF SYSTEMS:  GENERAL: no change in appetite, no fatigue, no weight changes, no fever, chills or weakness EYES: Denies change in vision, dry eyes, eye pain, itching or discharge EARS: Denies change in hearing, ringing in ears, or earache NOSE: Denies nasal congestion or epistaxis MOUTH and THROAT: Denies oral discomfort, gingival pain or bleeding, pain from  teeth or hoarseness   RESPIRATORY: no cough, SOB, DOE, wheezing, hemoptysis CARDIAC: no chest pain, edema or palpitations GI: no abdominal pain, diarrhea, heart burn, nausea or vomiting, +constipation GU: Denies dysuria, frequency, hematuria, incontinence, or discharge PSYCHIATRIC: Denies feeling of depression or anxiety. No report of hallucinations, insomnia, paranoia, or agitation   PHYSICAL EXAMINATION  GENERAL APPEARANCE: Well nourished. In no acute distress. Obese SKIN:  Skin is warm and dry HEAD: Normal in size and contour. No evidence of trauma EYES: Lids open and close normally. No blepharitis, entropion or ectropion. PERRL. Conjunctivae are clear and sclerae are white. Lenses are without opacity EARS: Pinnae are normal. Patient hears normal voice tunes of the examiner MOUTH and THROAT: Lips are without lesions. Oral mucosa is moist and without lesions. Tongue is normal in shape, size, and color and without lesions NECK: supple, trachea midline, no neck masses, no thyroid tenderness, no thyromegaly LYMPHATICS: no LAN in the neck, no supraclavicular LAN RESPIRATORY: breathing is even & unlabored, BS CTAB CARDIAC: Irregularly irregular, no murmur,no extra heart sounds, no edema GI: abdomen soft, normal BS, no masses, no tenderness, no hepatomegaly,  no splenomegaly EXTREMITIES: : Able to move X 4 extremities; has left knee brace PSYCHIATRIC: Alert and oriented X 3. Affect and behavior are appropriate  LABS/RADIOLOGY: Labs reviewed: 11/01/15  WBC 6.5  hemoglobin 11.6 hematocrit 35.8 MCV 94.2 platelet 184 Basic Metabolic Panel:  Recent Labs  40/98/11 0403 06/28/15 1221  06/29/15 0334  09/06/15 10/01/15 10/08/15  NA 142 141  < > 136  < > 139 143 146  K 4.3 4.3  --  4.0  < > 3.8 3.7 3.8  CL 105 106  --  102  --   --   --   --   CO2 28 27  --  26  --   --   --   --   GLUCOSE 104* 106*  --  106*  --   --   --   --   BUN 61* 76*  < > 83*  < > 56* 55* 46*  CREATININE 3.33* 3.37*   < > 2.93*  < > 2.0* 2.4* 2.0*  CALCIUM 8.8* 9.0  --  8.6*  --   --   --   --   < > = values in this interval not displayed. Liver Function Tests:  Recent Labs  12/15/14 0127 07/03/15  AST 48* 15  ALT 32 4*  ALKPHOS 68 70  BILITOT 0.7  --   PROT 5.7*  --   ALBUMIN 3.2*  --    CBC:  Recent Labs  06/27/15 0403 06/28/15 1221  06/29/15 0334 07/03/15 07/13/15 08/29/15  WBC 7.1 7.2  < > 6.4 5.7 6.4 4.5  NEUTROABS  --   --   --   --  3 4 2   HGB 9.4* 10.1*  --  9.4* 9.1* 9.9* 10.6*  HCT 30.2* 31.6*  --  28.1* 28* 30* 34*  MCV 93.5 92.9  --  87.5  --   --   --   PLT 209 208  --  200 229 292 176  < > = values in this interval not displayed.  CBG:  Recent Labs  12/07/14 1211 12/15/14 0737  GLUCAP 144* 104*     ASSESSMENT/PLAN:   Right patella tendon rupture S/P repair - she has been transitioned to RNP;  RLE  WBAT; follow-up with Dr. Charlann Boxer, orthopedics; fall precautions; continue Coumadin for DVT prophylaxis; Norco 7.5/325mg  1-2 tabs Q 4 hours PRN for pain; Tizanidine 4 mg 1 tab PO Q 6 hours PRN for muscle spasm  Hypothyroidism - continue Synthroid 112 g 1 tab by mouth daily; check tsh  Atrial fibrillation - rate controlled. continue coreg 12.5 mg bid, amiodarone 100 mg daily and Coumadin  CAD - stable; continue coreg, hydralazine, prn NTG, Atorvastatin 10 mg daily  Osteoporosis - Continue weekly fosamax and calcitriol ; fall precautions  Allergic rhinitis - continue cetirizine 10 mg daily   GERD - stable, continue prilosec 20 mg daily  Hypertension with renal disease - continue hydralazine 10 mg tid with coreg 12.5 mg bid  CKD stage 3 - check BMP  Lab Results  Component Value Date   CREATININE 2.0 (A) 10/08/2015    Dementia - Continue donepezil  10 mg 1 tab by mouth daily at bedtime and memantine 10 mg 1 tab by mouth twice a day  Chronic constipation - dcontinue Colace and start Senna-S 8.6-50 mg take 2 tabs BID; continue Linzess 145 MCG alternating with 290 mcg  daily and miralax bid.  Chronic diastolic heart failure - no SOB; continue  torsemide 20 mg 1 tab by mouth daily, coreg 12.5 mg 1 tab by mouth twice a day and hydralazine 25 mg 1 tab by mouth 3 times a day  Neuropathy - continue Lyrica 75 mg bid, Salonpas patch BID and cymbalta 60 mg daily  Hyperlipidemia - continue Atorvastatin 10 mg daily Lab Results  Component Value Date   CHOL 153 10/01/2015   HDL 36 10/01/2015   LDLCALC 67 10/01/2015   TRIG 247 (A) 10/01/2015   CHOLHDL 1.8 06/16/2010   Gout - continue Uloric 40 mg 1 tab by mouth every evening     Goals of care:  Short-term rehabilitation    Kenard Gower, NP [facility] [phone]

## 2015-11-26 DIAGNOSIS — Z139 Encounter for screening, unspecified: Secondary | ICD-10-CM | POA: Diagnosis not present

## 2015-11-26 DIAGNOSIS — I10 Essential (primary) hypertension: Secondary | ICD-10-CM | POA: Diagnosis not present

## 2015-12-19 ENCOUNTER — Non-Acute Institutional Stay (SKILLED_NURSING_FACILITY): Payer: Medicare Other | Admitting: Adult Health

## 2015-12-19 DIAGNOSIS — Z7901 Long term (current) use of anticoagulants: Secondary | ICD-10-CM | POA: Diagnosis not present

## 2015-12-19 DIAGNOSIS — I480 Paroxysmal atrial fibrillation: Secondary | ICD-10-CM | POA: Diagnosis not present

## 2015-12-19 NOTE — Progress Notes (Signed)
Subjective:     Indication: atrial fibrillation Bleeding signs/symptoms: None Thromboembolic signs/symptoms: None  Missed Coumadin doses: This week - 1 due to supratherapeutic INR Medication changes: no Dietary changes: no Bacterial/viral infection: no Other concerns: no  The following portions of the patient's history were reviewed and updated as appropriate: allergies, current medications, past family history, past medical history, past social history, past surgical history and problem list.  Review of Systems A comprehensive review of systems was negative.   Objective:    INR Today: 3.0 Current dose:  Coumadin 2.5 mg 1 tab PO Q M_W_F_Sun and Coumadin 2 mg 1 tab PO Q Tue-Th-Sat  Assessment:    Therapeutic INR for goal of 2-3   Plan:    1. New dose: increase Coumadin 2.5 mg I tab PO Q M_W_F and Coumadin 2 mg 1 tab PO Q Tue-Th-Sat-Sun  2. Next INR: 12/21/15

## 2015-12-25 ENCOUNTER — Other Ambulatory Visit: Payer: Self-pay | Admitting: *Deleted

## 2015-12-25 DIAGNOSIS — G8929 Other chronic pain: Secondary | ICD-10-CM | POA: Diagnosis not present

## 2015-12-25 DIAGNOSIS — M25571 Pain in right ankle and joints of right foot: Secondary | ICD-10-CM | POA: Diagnosis not present

## 2015-12-25 DIAGNOSIS — M25572 Pain in left ankle and joints of left foot: Secondary | ICD-10-CM | POA: Diagnosis not present

## 2015-12-25 DIAGNOSIS — M545 Low back pain: Secondary | ICD-10-CM | POA: Diagnosis not present

## 2015-12-25 MED ORDER — PREGABALIN 75 MG PO CAPS: ORAL_CAPSULE | ORAL | 0 refills | Status: DC

## 2015-12-25 NOTE — Telephone Encounter (Signed)
[facility] #[phone] Fax: [phone] 

## 2015-12-26 DIAGNOSIS — M1712 Unilateral primary osteoarthritis, left knee: Secondary | ICD-10-CM | POA: Diagnosis not present

## 2015-12-27 DIAGNOSIS — M545 Low back pain: Secondary | ICD-10-CM | POA: Diagnosis not present

## 2015-12-27 DIAGNOSIS — R262 Difficulty in walking, not elsewhere classified: Secondary | ICD-10-CM | POA: Diagnosis not present

## 2015-12-27 DIAGNOSIS — R600 Localized edema: Secondary | ICD-10-CM | POA: Diagnosis not present

## 2015-12-27 DIAGNOSIS — R5381 Other malaise: Secondary | ICD-10-CM | POA: Diagnosis not present

## 2015-12-27 DIAGNOSIS — M5441 Lumbago with sciatica, right side: Secondary | ICD-10-CM | POA: Diagnosis not present

## 2015-12-31 DIAGNOSIS — M6281 Muscle weakness (generalized): Secondary | ICD-10-CM | POA: Diagnosis not present

## 2015-12-31 DIAGNOSIS — R262 Difficulty in walking, not elsewhere classified: Secondary | ICD-10-CM | POA: Diagnosis not present

## 2016-01-01 DIAGNOSIS — R262 Difficulty in walking, not elsewhere classified: Secondary | ICD-10-CM | POA: Diagnosis not present

## 2016-01-01 DIAGNOSIS — M6281 Muscle weakness (generalized): Secondary | ICD-10-CM | POA: Diagnosis not present

## 2016-01-02 DIAGNOSIS — R5381 Other malaise: Secondary | ICD-10-CM | POA: Diagnosis not present

## 2016-01-02 DIAGNOSIS — R600 Localized edema: Secondary | ICD-10-CM | POA: Diagnosis not present

## 2016-01-02 DIAGNOSIS — M545 Low back pain: Secondary | ICD-10-CM | POA: Diagnosis not present

## 2016-01-02 DIAGNOSIS — M6281 Muscle weakness (generalized): Secondary | ICD-10-CM | POA: Diagnosis not present

## 2016-01-02 DIAGNOSIS — M5441 Lumbago with sciatica, right side: Secondary | ICD-10-CM | POA: Diagnosis not present

## 2016-01-02 DIAGNOSIS — M1712 Unilateral primary osteoarthritis, left knee: Secondary | ICD-10-CM | POA: Diagnosis not present

## 2016-01-02 DIAGNOSIS — R262 Difficulty in walking, not elsewhere classified: Secondary | ICD-10-CM | POA: Diagnosis not present

## 2016-01-03 DIAGNOSIS — M6281 Muscle weakness (generalized): Secondary | ICD-10-CM | POA: Diagnosis not present

## 2016-01-03 DIAGNOSIS — R262 Difficulty in walking, not elsewhere classified: Secondary | ICD-10-CM | POA: Diagnosis not present

## 2016-01-04 DIAGNOSIS — M6281 Muscle weakness (generalized): Secondary | ICD-10-CM | POA: Diagnosis not present

## 2016-01-04 DIAGNOSIS — R262 Difficulty in walking, not elsewhere classified: Secondary | ICD-10-CM | POA: Diagnosis not present

## 2016-01-07 ENCOUNTER — Other Ambulatory Visit: Payer: Self-pay | Admitting: *Deleted

## 2016-01-07 DIAGNOSIS — Z79899 Other long term (current) drug therapy: Secondary | ICD-10-CM | POA: Diagnosis not present

## 2016-01-07 DIAGNOSIS — M5441 Lumbago with sciatica, right side: Secondary | ICD-10-CM | POA: Diagnosis not present

## 2016-01-07 DIAGNOSIS — R262 Difficulty in walking, not elsewhere classified: Secondary | ICD-10-CM | POA: Diagnosis not present

## 2016-01-07 DIAGNOSIS — R600 Localized edema: Secondary | ICD-10-CM | POA: Diagnosis not present

## 2016-01-07 DIAGNOSIS — Z9181 History of falling: Secondary | ICD-10-CM | POA: Diagnosis not present

## 2016-01-07 DIAGNOSIS — M545 Low back pain: Secondary | ICD-10-CM | POA: Diagnosis not present

## 2016-01-07 DIAGNOSIS — M25532 Pain in left wrist: Secondary | ICD-10-CM | POA: Diagnosis not present

## 2016-01-07 DIAGNOSIS — M6281 Muscle weakness (generalized): Secondary | ICD-10-CM | POA: Diagnosis not present

## 2016-01-07 LAB — BASIC METABOLIC PANEL
BUN: 72 mg/dL — AB (ref 4–21)
CREATININE: 2.5 mg/dL — AB (ref 0.5–1.1)
GLUCOSE: 86 mg/dL
Potassium: 3.7 mmol/L (ref 3.4–5.3)
SODIUM: 144 mmol/L (ref 137–147)

## 2016-01-07 LAB — TSH: TSH: 1.11 u[IU]/mL (ref 0.41–5.90)

## 2016-01-07 MED ORDER — HYDROCODONE-ACETAMINOPHEN 7.5-325 MG PO TABS: ORAL_TABLET | ORAL | 0 refills | Status: DC

## 2016-01-07 NOTE — Telephone Encounter (Signed)
[facility] #[phone] Fax: [phone] 

## 2016-01-08 ENCOUNTER — Non-Acute Institutional Stay (SKILLED_NURSING_FACILITY): Payer: Medicare Other | Admitting: Internal Medicine

## 2016-01-08 ENCOUNTER — Encounter: Payer: Self-pay | Admitting: Internal Medicine

## 2016-01-08 DIAGNOSIS — F0390 Unspecified dementia without behavioral disturbance: Secondary | ICD-10-CM | POA: Diagnosis not present

## 2016-01-08 DIAGNOSIS — I480 Paroxysmal atrial fibrillation: Secondary | ICD-10-CM | POA: Diagnosis not present

## 2016-01-08 DIAGNOSIS — E038 Other specified hypothyroidism: Secondary | ICD-10-CM | POA: Diagnosis not present

## 2016-01-08 DIAGNOSIS — W19XXXA Unspecified fall, initial encounter: Secondary | ICD-10-CM | POA: Diagnosis not present

## 2016-01-08 DIAGNOSIS — N184 Chronic kidney disease, stage 4 (severe): Secondary | ICD-10-CM | POA: Diagnosis not present

## 2016-01-08 DIAGNOSIS — M792 Neuralgia and neuritis, unspecified: Secondary | ICD-10-CM | POA: Diagnosis not present

## 2016-01-08 DIAGNOSIS — K219 Gastro-esophageal reflux disease without esophagitis: Secondary | ICD-10-CM

## 2016-01-08 DIAGNOSIS — I5032 Chronic diastolic (congestive) heart failure: Secondary | ICD-10-CM | POA: Diagnosis not present

## 2016-01-08 DIAGNOSIS — M545 Low back pain: Secondary | ICD-10-CM | POA: Diagnosis not present

## 2016-01-08 DIAGNOSIS — M5441 Lumbago with sciatica, right side: Secondary | ICD-10-CM | POA: Diagnosis not present

## 2016-01-08 DIAGNOSIS — K5909 Other constipation: Secondary | ICD-10-CM

## 2016-01-08 DIAGNOSIS — Z9181 History of falling: Secondary | ICD-10-CM | POA: Diagnosis not present

## 2016-01-08 DIAGNOSIS — R262 Difficulty in walking, not elsewhere classified: Secondary | ICD-10-CM | POA: Diagnosis not present

## 2016-01-08 DIAGNOSIS — M25551 Pain in right hip: Secondary | ICD-10-CM | POA: Diagnosis not present

## 2016-01-08 DIAGNOSIS — K5900 Constipation, unspecified: Secondary | ICD-10-CM | POA: Diagnosis not present

## 2016-01-08 DIAGNOSIS — M25532 Pain in left wrist: Secondary | ICD-10-CM | POA: Diagnosis not present

## 2016-01-08 DIAGNOSIS — M6281 Muscle weakness (generalized): Secondary | ICD-10-CM | POA: Diagnosis not present

## 2016-01-08 DIAGNOSIS — D638 Anemia in other chronic diseases classified elsewhere: Secondary | ICD-10-CM

## 2016-01-08 NOTE — Progress Notes (Signed)
LOCATION: [facility]  PCP: Pearson Grippe, MD   Code Status: Full Code  Goals of care: Advanced Directive information Advanced Directives 06/26/2015  Does patient have an advance directive? Yes  Type of Estate agent of [place];Living will  Does patient want to make changes to advanced directive? -  Copy of advanced directive(s) in chart? -  Would patient like information on creating an advanced directive? -  Pre-existing out of facility DNR order (yellow form or pink MOST form) -       Extended Emergency Contact Information Primary Emergency Contact: Clendenen,David Address: [address]          [place], [place] [address] [place] Home Phone: [phone] Mobile Phone: [phone] Relation: Son Secondary Emergency Contact: Gray,Sharon Address: [address]          [place], [place] [address] [place] Home Phone: [phone] Mobile Phone: [phone] Relation: Daughter   Allergies  Allergen Reactions  . Nubain [Nalbuphine Hcl] Hives    Went into cardiac arrest   . Codeine Hives and Nausea Only  . Darvocet [Propoxyphene N-Acetaminophen] Nausea Only    Chief Complaint  Patient presents with  . Medical Management of Chronic Issues    Routine Visit     HPI:  Patient is a 82 y.o. female seen today for routine visit. She complaints of ongoing neuropathic pain. She has been constipated. Her labs were reviewed and shows worsening renal function.    Review of Systems:  Constitutional: Negative for fever, chills. HENT: Negative for headache, congestion, sore throat, difficulty swallowing. Positive for chronic rhinitis.   Eyes: Negative for blurred vision, double vision and discharge. Wears glasses.  Respiratory: Negative for cough, shortness of breath and wheezing.   Cardiovascular: Negative for chest pain, palpitations. Positive for leg swelling.  Gastrointestinal: Negative for nausea, vomiting,  abdominal pain. Positive for occasional heartburn. Last bowel movement was 4 days back.  Genitourinary: Negative for dysuria Musculoskeletal: had a fall in the facility on 01/07/16 with no apparent injury. she was complaining of left wrsit pain and xray has ruled out fracture. Positive for chronic back pain and multiple joints pain.  Skin: Negative for itching, rash.  Neurological: Positive for occasional headache and dizziness. Psychiatric/Behavioral: Negative for depression. Has memory loss.    Past Medical History:  Diagnosis Date  . Acute on chronic renal failure [facility])    sees Dr Allena Katz   . Anemia    Acute blood loss  . Aortic regurgitation   . Aortic stenosis 10/13/2012   Low EF, low gradient with severe aortic stenosis confirmed by dobutamine stress echocardiogram s/p TAVR 12/2012  . Asthma   . Atrial fibrillation (HCC)    tachy-brady syndrome with <1% recurrent PAF since pacemaker placement  . Cataracts, bilateral   . Chronic combined systolic and diastolic CHF (congestive heart failure) (HCC)   . Chronic lower back pain   . CKD (chronic kidney disease)   . Coronary artery disease involving native coronary artery of native heart   . Dementia    Without behavioral disturbance  . Dysrhythmia   . Fibromyalgia   . Gastroesophageal reflux disease   . H/O dizziness   . H/O urinary frequency   . H/O: stroke   . Hard of hearing   . Headache    hx of migraines   . Heart murmur   . History of blood transfusion   . History of bronchitis   . History of kidney  stones   . History of urinary tract infection   . HLD (hyperlipidemia)   . HTN (hypertension)   . Hypothyroidism   . Neuropathy (HCC)   . Nonischemic cardiomyopathy (HCC)   . On home oxygen therapy    patient uses at nite- 2L- has not used in > 6 months per patient  . Osteoarthritis   . Osteoporosis   . Pneumonia    hx of x 3   . PONV (postoperative nausea and vomiting)   . Presence of permanent cardiac pacemaker   .  Pulmonary embolism (HCC)    HISTORY OF, the pt. had a recurrent bilateral pulmonary emboli in 2005, on warfarin therapy and at which time she under went implantation of IVC filter  . Repeated falls   . Rupture of right patellar tendon   . Sciatica   . Shortness of breath dyspnea    with exertion   . Sleep apnea    uses oxygen at night and PRN- not used since > 6 months / DOES NOT USE  C-PAP  . Spinal stenosis   . Stroke (HCC)   . Symptomatic bradycardia   . Symptomatic bradycardia 2012   s/p Medtronic PPM  . Syncope   . Urinary incontinence   . Urinary urgency    Past Surgical History:  Procedure Laterality Date  . ABDOMINAL HYSTERECTOMY    . APPENDECTOMY    . BACK SURGERY    . BUNIONECTOMY    . CARDIAC CATHETERIZATION    . CATARACT EXTRACTION    . CENTRAL VENOUS CATHETER INSERTION Left 12/21/2012   Procedure: INSERTION CENTRAL LINE ADULT;  Surgeon: Tonny Bollman, MD;  Location: [facility] OR;  Service: Open Heart Surgery;  Laterality: Left;  . ELBOW SURGERY     bilat   . INTRAOPERATIVE TRANSESOPHAGEAL ECHOCARDIOGRAM N/A 12/21/2012   Procedure: INTRAOPERATIVE TRANSESOPHAGEAL ECHOCARDIOGRAM;  Surgeon: Tonny Bollman, MD;  Location: [facility] OR;  Service: Open Heart Surgery;  Laterality: N/A;  . KNEE SURGERY Left   . LEFT AND RIGHT HEART CATHETERIZATION WITH CORONARY ANGIOGRAM N/A 10/18/2012   Procedure: LEFT AND RIGHT HEART CATHETERIZATION WITH CORONARY ANGIOGRAM;  Surgeon: Tonny Bollman, MD;  Location: [facility] CATH LAB;  Service: Cardiovascular;  Laterality: N/A;  . NASAL SEPTUM SURGERY    . NOSE SURGERY     X 2  . ORIF PATELLA Right 03/08/2015   Procedure:  OPEN REDUCTION INTERNAL FIXATION RIGHT  PATELLA TENDON AVULSION;  Surgeon: Durene Romans, MD;  Location: [facility] ORS;  Service: Orthopedics;  Laterality: Right;  . PACEMAKER INSERTION     Medtronic  . PATELLAR TENDON REPAIR Right 06/25/2015   Procedure: RIGHT PATELLA TENDON REVISION/REPAIR;  Surgeon: Durene Romans, MD;  Location: [facility] ORS;  Service:  Orthopedics;  Laterality: Right;  . SHOULDER SURGERY     bilat   . THYROIDECTOMY, PARTIAL    . TONSILLECTOMY    . TOTAL KNEE ARTHROPLASTY Right 12/04/2014   Procedure: RIGHT TOTAL  KNEE ARTHROPLASTY;  Surgeon: Durene Romans, MD;  Location: [facility] ORS;  Service: Orthopedics;  Laterality: Right;  . TRANSCATHETER AORTIC VALVE REPLACEMENT, TRANSFEMORAL  12/21/2012   a. 42mm Edwards Sapien XT transcatheter heart valve placed via open left transfemoral approach b. Intra-op TEE: well-seated bioprosthetic aortic valve with mean gradient 2 mmHg, trivial AI, mild MR, EF 30-35%  . TRANSCATHETER AORTIC VALVE REPLACEMENT, TRANSFEMORAL N/A 12/21/2012   Procedure: TRANSCATHETER AORTIC VALVE REPLACEMENT, TRANSFEMORAL;  Surgeon: Tonny Bollman, MD;  Location: [facility] OR;  Service: Open Heart Surgery;  Laterality: N/A;  Medications:   Medication List       Accurate as of 01/08/16  3:42 PM. Always use your most recent med list.          alendronate 70 MG/75ML solution Commonly known as:  FOSAMAX Take 70 mg by mouth every 7 (seven) days. Take with a full glass of water on an empty stomach on Saturday   amiodarone 100 MG tablet Commonly known as:  PACERONE Take 100 mg by mouth daily.   atorvastatin 10 MG tablet Commonly known as:  LIPITOR Take 10 mg by mouth daily.   BIOFREEZE 4 % Gel Generic drug:  Menthol (Topical Analgesic) Apply 1 application topically 2 (two) times daily. Apply to back of neck, lower back, and right leg   BIOTIN 5000 PO Take 5,000 mcg by mouth every evening.   bisacodyl 10 MG suppository Commonly known as:  DULCOLAX Place 10 mg rectally daily as needed for moderate constipation.   calcitRIOL 0.25 MCG capsule Commonly known as:  ROCALTROL Take 0.25 mcg by mouth every morning.   carvedilol 12.5 MG tablet Commonly known as:  COREG Take 1 tablet by mouth 2 (two) times daily.   cetirizine 10 MG tablet Commonly known as:  ZYRTEC Take 10 mg by mouth every morning.     cycloSPORINE 0.05 % ophthalmic emulsion Commonly known as:  RESTASIS Place 1 drop into both eyes 2 (two) times daily as needed (dry eyes.).   docusate sodium 100 MG capsule Commonly known as:  COLACE Take 1 capsule (100 mg total) by mouth 2 (two) times daily.   donepezil 10 MG tablet Commonly known as:  ARICEPT Take 10 mg by mouth at bedtime.   DULoxetine 60 MG capsule Commonly known as:  CYMBALTA Take 60 mg by mouth every evening.   esomeprazole 40 MG capsule Commonly known as:  NEXIUM Take 40 mg by mouth daily at 12 noon.   ferrous sulfate 325 (65 FE) MG tablet Take 325 mg by mouth daily.   hydrALAZINE 25 MG tablet Commonly known as:  APRESOLINE Take 25 mg by mouth 3 (three) times daily.   HYDROcodone-acetaminophen 7.5-325 MG tablet Commonly known as:  NORCO Take one tablet by mouth every 4 hours as needed for moderate pain. Hold for sedation. Do not exceed 3gm of APAP in 24 hours.   ipratropium-albuterol 0.5-2.5 (3) MG/3ML Soln Commonly known as:  DUONEB Take 3 mLs by nebulization daily as needed.   levothyroxine 112 MCG tablet Commonly known as:  SYNTHROID, LEVOTHROID Take 112 mcg by mouth daily.   LINZESS 145 MCG Caps capsule Generic drug:  linaclotide Take 145 mcg by mouth every other day. Alternates with the 290 mg tablet in the am.   linaclotide 290 MCG Caps capsule Commonly known as:  LINZESS Take 290 mcg by mouth every other day. Alternates with the 145 mg tablets in the morning.   memantine 10 MG tablet Commonly known as:  NAMENDA Take 10 mg by mouth 2 (two) times daily.   multivitamin tablet Take 0.5 tablets by mouth daily. Give 1/2 tablet PO QAM for supplement   nitroGLYCERIN 0.4 MG SL tablet Commonly known as:  NITROSTAT Place 0.4 mg under the tongue every 5 (five) minutes as needed for chest pain (MAX 3 TABLETS).   polyethylene glycol packet Commonly known as:  MIRALAX / GLYCOLAX Take 17 g by mouth 2 (two) times daily.   pregabalin 75 MG  capsule Commonly known as:  LYRICA Take one capsule by mouth every 12 hours  for pains   SALONPAS ARTHRITIS PAIN RELIEF EX Apply 1 patch topically 2 (two) times daily. Monitor for rash or skin irritation   simethicone 80 MG chewable tablet Commonly known as:  MYLICON Chew 80 mg by mouth every 12 (twelve) hours as needed for flatulence.   tiZANidine 4 MG tablet Commonly known as:  ZANAFLEX Take 1 tablet (4 mg total) by mouth every 6 (six) hours as needed for muscle spasms.   torsemide 20 MG tablet Commonly known as:  DEMADEX Take 20 mg by mouth daily.   Turmeric 500 MG Caps Take 1 capsule by mouth 2 (two) times daily.   ULORIC 40 MG tablet Generic drug:  febuxostat Take 40 mg by mouth every evening.   vitamin B-12 1000 MCG tablet Commonly known as:  CYANOCOBALAMIN Take 1,000 mcg by mouth 2 (two) times a week. Tuesday and Friday   Vitamin D-3 1000 units Caps Take 2 capsules by mouth. Take 2 capsules to = 2000 units PO QHS   warfarin 2 MG tablet Commonly known as:  COUMADIN Take 2 mg by mouth. Take Tue-Thu-Sat   warfarin 2.5 MG tablet Commonly known as:  COUMADIN Take 2.5 mg by mouth. Take on Mon-Wed-Fri-Sun       Immunizations: Immunization History  Administered Date(s) Administered  . PPD Test 06/29/2015     Physical Exam: Vitals:   01/08/16 1532  BP: (!) 155/69  Pulse: 67  Resp: 20  Temp: 97.7 F (36.5 C)  TempSrc: Oral  SpO2: 91%  Weight: 190 lb 6.4 oz (86.4 kg)  Height: 4\' 10"  (1.473 m)   Body mass index is 39.79 kg/m.  General- elderly female, obese, in no acute distress Head- normocephalic, atraumatic Nose- no nasal discharge Throat- moist mucus membrane, has upper and lower dentures Eyes- PERRLA, EOMI, no pallor, no icterus Neck- no cervical lymphadenopathy Cardiovascular- normal s1,s2, no murmur, 1+ leg edema Respiratory- bilateral clear to auscultation, no wheeze, no rhonchi, no crackles, no use of accessory muscles Abdomen- bowel  sounds present, soft, non tender Musculoskeletal- able to move all 4 extremities, on wheelchair Neurological- alert and oriented to person, place and time Skin- warm and dry Psychiatry- normal mood and affect    Labs reviewed: Basic Metabolic Panel:  Recent Labs  08/65/78 0403 06/28/15 1221  06/29/15 0334  10/01/15 10/08/15 01/07/16  NA 142 141  < > 136  < > 143 146 144  K 4.3 4.3  --  4.0  < > 3.7 3.8 3.7  CL 105 106  --  102  --   --   --   --   CO2 28 27  --  26  --   --   --   --   GLUCOSE 104* 106*  --  106*  --   --   --   --   BUN 61* 76*  < > 83*  < > 55* 46* 72*  CREATININE 3.33* 3.37*  < > 2.93*  < > 2.4* 2.0* 2.5*  CALCIUM 8.8* 9.0  --  8.6*  --   --   --   --   < > = values in this interval not displayed. Liver Function Tests:  Recent Labs  07/03/15  AST 15  ALT 4*  ALKPHOS 70   No results for input(s): LIPASE, AMYLASE in the last 8760 hours. No results for input(s): AMMONIA in the last 8760 hours. CBC:  Recent Labs  06/27/15 0403 06/28/15 1221  06/29/15 0334 07/03/15 07/13/15 08/29/15  WBC 7.1 7.2  < > 6.4 5.7 6.4 4.5  NEUTROABS  --   --   --   --  3 4 2   HGB 9.4* 10.1*  --  9.4* 9.1* 9.9* 10.6*  HCT 30.2* 31.6*  --  28.1* 28* 30* 34*  MCV 93.5 92.9  --  87.5  --   --   --   PLT 209 208  --  200 229 292 176  < > = values in this interval not displayed.    Assessment/Plan  ckd stage 4  Nephrology referral for worsening renal function. Monitor clinically. Adjustment to medication affecting renal function as below  Anemia of chronic disease Monitor cbc. Continue feso4 325 mg daily  afib Rate controlled. continue coreg 12.5 mg bid and amiodarone 100 mg daily. Continue coumadin and monitor inr and heart rate  Dementia Can make meaningful conversation. Continue donepezil and memantine for now but decrease memantine to 5 mg bid. Assistance with ADLs as needed. Fall precautions.  CHF Chronic diastolic. Appears euvolemic. Continue torsemide,  coreg and hydralazine.monitor bmp  Hypothyroidism Reviewed tsh, continue levothyroxine  gerd Stable, continue nexium 40 mg daily  Neuropathic pain Continue lyrica 50 mg tid and cymbalta 60 mg daily  Constipation chronic Change linzess to 145 mcg daily and continue miralax   Labs/tests ordered: nephrology referral, bmp in 1 week   Family/ staff Communication: reviewed care plan with patient and nursing supervisor    Oneal Grout, MD Internal Medicine [facility] [address] [place], [place] [address] Cell Phone (Monday-Friday 8 am - 5 pm): [phone] On Call: [phone] and follow prompts after 5 pm and on weekends Office Phone: [phone] Office Fax: [phone]

## 2016-01-09 DIAGNOSIS — Z9181 History of falling: Secondary | ICD-10-CM | POA: Diagnosis not present

## 2016-01-09 DIAGNOSIS — R262 Difficulty in walking, not elsewhere classified: Secondary | ICD-10-CM | POA: Diagnosis not present

## 2016-01-09 DIAGNOSIS — M1712 Unilateral primary osteoarthritis, left knee: Secondary | ICD-10-CM | POA: Diagnosis not present

## 2016-01-09 DIAGNOSIS — M25532 Pain in left wrist: Secondary | ICD-10-CM | POA: Diagnosis not present

## 2016-01-09 DIAGNOSIS — M6281 Muscle weakness (generalized): Secondary | ICD-10-CM | POA: Diagnosis not present

## 2016-01-09 DIAGNOSIS — M545 Low back pain: Secondary | ICD-10-CM | POA: Diagnosis not present

## 2016-01-09 DIAGNOSIS — K219 Gastro-esophageal reflux disease without esophagitis: Secondary | ICD-10-CM | POA: Insufficient documentation

## 2016-01-09 DIAGNOSIS — E039 Hypothyroidism, unspecified: Secondary | ICD-10-CM | POA: Insufficient documentation

## 2016-01-09 DIAGNOSIS — M792 Neuralgia and neuritis, unspecified: Secondary | ICD-10-CM | POA: Insufficient documentation

## 2016-01-09 DIAGNOSIS — F0390 Unspecified dementia without behavioral disturbance: Secondary | ICD-10-CM | POA: Insufficient documentation

## 2016-01-09 DIAGNOSIS — M5441 Lumbago with sciatica, right side: Secondary | ICD-10-CM | POA: Diagnosis not present

## 2016-01-09 DIAGNOSIS — N184 Chronic kidney disease, stage 4 (severe): Secondary | ICD-10-CM | POA: Insufficient documentation

## 2016-01-09 DIAGNOSIS — M25551 Pain in right hip: Secondary | ICD-10-CM | POA: Diagnosis not present

## 2016-01-09 DIAGNOSIS — K5909 Other constipation: Secondary | ICD-10-CM | POA: Insufficient documentation

## 2016-01-10 DIAGNOSIS — M6281 Muscle weakness (generalized): Secondary | ICD-10-CM | POA: Diagnosis not present

## 2016-01-10 DIAGNOSIS — R262 Difficulty in walking, not elsewhere classified: Secondary | ICD-10-CM | POA: Diagnosis not present

## 2016-01-10 DIAGNOSIS — M545 Low back pain: Secondary | ICD-10-CM | POA: Diagnosis not present

## 2016-01-10 DIAGNOSIS — M25559 Pain in unspecified hip: Secondary | ICD-10-CM | POA: Diagnosis not present

## 2016-01-11 DIAGNOSIS — M6281 Muscle weakness (generalized): Secondary | ICD-10-CM | POA: Diagnosis not present

## 2016-01-11 DIAGNOSIS — R262 Difficulty in walking, not elsewhere classified: Secondary | ICD-10-CM | POA: Diagnosis not present

## 2016-01-16 DIAGNOSIS — Z79899 Other long term (current) drug therapy: Secondary | ICD-10-CM | POA: Diagnosis not present

## 2016-01-16 DIAGNOSIS — D509 Iron deficiency anemia, unspecified: Secondary | ICD-10-CM | POA: Diagnosis not present

## 2016-01-16 DIAGNOSIS — M6281 Muscle weakness (generalized): Secondary | ICD-10-CM | POA: Diagnosis not present

## 2016-01-16 DIAGNOSIS — R262 Difficulty in walking, not elsewhere classified: Secondary | ICD-10-CM | POA: Diagnosis not present

## 2016-01-16 LAB — BASIC METABOLIC PANEL
BUN: 74 mg/dL — AB (ref 4–21)
Creatinine: 3.1 mg/dL — AB (ref 0.5–1.1)
GLUCOSE: 82 mg/dL
Potassium: 4.1 mmol/L (ref 3.4–5.3)
Sodium: 142 mmol/L (ref 137–147)

## 2016-01-17 ENCOUNTER — Encounter: Payer: Self-pay | Admitting: Internal Medicine

## 2016-01-17 ENCOUNTER — Non-Acute Institutional Stay (SKILLED_NURSING_FACILITY): Payer: Medicare Other | Admitting: Internal Medicine

## 2016-01-17 DIAGNOSIS — Z8709 Personal history of other diseases of the respiratory system: Secondary | ICD-10-CM | POA: Diagnosis not present

## 2016-01-17 DIAGNOSIS — N183 Chronic kidney disease, stage 3 unspecified: Secondary | ICD-10-CM

## 2016-01-17 DIAGNOSIS — R262 Difficulty in walking, not elsewhere classified: Secondary | ICD-10-CM | POA: Diagnosis not present

## 2016-01-17 DIAGNOSIS — M6281 Muscle weakness (generalized): Secondary | ICD-10-CM | POA: Diagnosis not present

## 2016-01-17 DIAGNOSIS — I5032 Chronic diastolic (congestive) heart failure: Secondary | ICD-10-CM | POA: Diagnosis not present

## 2016-01-17 DIAGNOSIS — Z87898 Personal history of other specified conditions: Secondary | ICD-10-CM

## 2016-01-17 NOTE — Progress Notes (Signed)
Patient ID: [name], female   DOB: 06-May-1932, 82 y.o.   MRN: [id number]  This is an acute visit.  Level care skilled.  Facility [facility].  Chief complaint-acute visit follow-up increased renal insufficiency.  History of present illness.  Patient is a pleasant 82 year old female seen today for follow-up of renal insufficiency.  She does have a history of chronic kidney disease stage III-her renal function has been periodically monitor and appears her renal function is gradually deteriorating some.  She was seen last week by Dr. Sander Radon for a routine visit-at that point her creatinine was 2.45 and BUN 72-nephrology consult is pending.  Updated lab work done yesterday September 27 shows creatinine has risen to 3.09 with BUN relatively unchanged at 74.  Her sodium was 142 potassium is 4.1 CO2 is 32.6 which appears to be roughly within her recent baseline.  She is not complaining of any increased shortness of breath-she does complain of occasional wheezing she says this is not increased from her baseline however she is on duo nebs once a day when necessary -- unsure if she is really asking for this.  I do note she is on torsemide 20 mg twice a day-with a history of CHF.   Review of Systems:  Constitutional: Negative for fever, chills. HENT: Negative for headache, congestion, sore throat, difficulty swallowing..   Eyes: Negative for blurred vision, double vision and discharge. Wears glasses.  Respiratory: Says she has occasional wheezing but this is not increased from baseline does not really complain of shortness of breath or cough   Cardiovascular: Negative for chest pain, palpitations. Positive for leg swelling.  Gastrointestinal: Negative for nausea, vomiting, abdominal pain. Positive for occasional heartburn. Last bowel movement was 4 days back.  Genitourinary: Negative for dysuria Musculoskeletal:  Positive for chronic back pain and multiple joints pain.  Skin:  Negative for itching, rash.  Neurological: Positive for occasional headache and dizziness. Psychiatric/Behavioral: Negative for depression. Has memory loss.        Past Medical History:  Diagnosis Date  . Acute on chronic renal failure [facility])    sees Dr Allena Katz   . Anemia    Acute blood loss  . Aortic regurgitation   . Aortic stenosis 10/13/2012   Low EF, low gradient with severe aortic stenosis confirmed by dobutamine stress echocardiogram s/p TAVR 12/2012  . Asthma   . Atrial fibrillation (HCC)    tachy-brady syndrome with <1% recurrent PAF since pacemaker placement  . Cataracts, bilateral   . Chronic combined systolic and diastolic CHF (congestive heart failure) (HCC)   . Chronic lower back pain   . CKD (chronic kidney disease)   . Coronary artery disease involving native coronary artery of native heart   . Dementia    Without behavioral disturbance  . Dysrhythmia   . Fibromyalgia   . Gastroesophageal reflux disease   . H/O dizziness   . H/O urinary frequency   . H/O: stroke   . Hard of hearing   . Headache    hx of migraines   . Heart murmur   . History of blood transfusion   . History of bronchitis   . History of kidney stones   . History of urinary tract infection   . HLD (hyperlipidemia)   . HTN (hypertension)   . Hypothyroidism   . Neuropathy (HCC)   . Nonischemic cardiomyopathy (HCC)   . On home oxygen therapy    patient uses at nite- 2L- has not used in >  6 months per patient  . Osteoarthritis   . Osteoporosis   . Pneumonia    hx of x 3   . PONV (postoperative nausea and vomiting)   . Presence of permanent cardiac pacemaker   . Pulmonary embolism (HCC)    HISTORY OF, the pt. had a recurrent bilateral pulmonary emboli in 2005, on warfarin therapy and at which time she under went implantation of IVC filter  . Repeated falls   . Rupture of right patellar tendon   . Sciatica   . Shortness of breath dyspnea     with exertion   . Sleep apnea    uses oxygen at night and PRN- not used since > 6 months / DOES NOT USE  C-PAP  . Spinal stenosis   . Stroke (HCC)   . Symptomatic bradycardia   . Symptomatic bradycardia 2012   s/p Medtronic PPM  . Syncope   . Urinary incontinence   . Urinary urgency         Past Surgical History:  Procedure Laterality Date  . ABDOMINAL HYSTERECTOMY    . APPENDECTOMY    . BACK SURGERY    . BUNIONECTOMY    . CARDIAC CATHETERIZATION    . CATARACT EXTRACTION    . CENTRAL VENOUS CATHETER INSERTION Left 12/21/2012   Procedure: INSERTION CENTRAL LINE ADULT;  Surgeon: Tonny Bollman, MD;  Location: [facility] OR;  Service: Open Heart Surgery;  Laterality: Left;  . ELBOW SURGERY     bilat   . INTRAOPERATIVE TRANSESOPHAGEAL ECHOCARDIOGRAM N/A 12/21/2012   Procedure: INTRAOPERATIVE TRANSESOPHAGEAL ECHOCARDIOGRAM;  Surgeon: Tonny Bollman, MD;  Location: [facility] OR;  Service: Open Heart Surgery;  Laterality: N/A;  . KNEE SURGERY Left   . LEFT AND RIGHT HEART CATHETERIZATION WITH CORONARY ANGIOGRAM N/A 10/18/2012   Procedure: LEFT AND RIGHT HEART CATHETERIZATION WITH CORONARY ANGIOGRAM;  Surgeon: Tonny Bollman, MD;  Location: [facility] CATH LAB;  Service: Cardiovascular;  Laterality: N/A;  . NASAL SEPTUM SURGERY    . NOSE SURGERY     X 2  . ORIF PATELLA Right 03/08/2015   Procedure:  OPEN REDUCTION INTERNAL FIXATION RIGHT  PATELLA TENDON AVULSION;  Surgeon: Durene Romans, MD;  Location: [facility] ORS;  Service: Orthopedics;  Laterality: Right;  . PACEMAKER INSERTION     Medtronic  . PATELLAR TENDON REPAIR Right 06/25/2015   Procedure: RIGHT PATELLA TENDON REVISION/REPAIR;  Surgeon: Durene Romans, MD;  Location: [facility] ORS;  Service: Orthopedics;  Laterality: Right;  . SHOULDER SURGERY     bilat   . THYROIDECTOMY, PARTIAL    . TONSILLECTOMY    . TOTAL KNEE ARTHROPLASTY Right 12/04/2014   Procedure: RIGHT TOTAL  KNEE ARTHROPLASTY;  Surgeon: Durene Romans, MD;   Location: [facility] ORS;  Service: Orthopedics;  Laterality: Right;  . TRANSCATHETER AORTIC VALVE REPLACEMENT, TRANSFEMORAL  12/21/2012   a. 82mm Edwards Sapien XT transcatheter heart valve placed via open left transfemoral approach b. Intra-op TEE: well-seated bioprosthetic aortic valve with mean gradient 2 mmHg, trivial AI, mild MR, EF 30-35%  . TRANSCATHETER AORTIC VALVE REPLACEMENT, TRANSFEMORAL N/A 12/21/2012   Procedure: TRANSCATHETER AORTIC VALVE REPLACEMENT, TRANSFEMORAL;  Surgeon: Tonny Bollman, MD;  Location: [facility] OR;  Service: Open Heart Surgery;  Laterality: N/A;     Medications:       Medication List                     alendronate 70 MG/75ML solution Commonly known as:  FOSAMAX Take 70 mg by mouth every 7 (seven)  days. Take with a full glass of water on an empty stomach on Saturday  amiodarone 100 MG tablet Commonly known as:  PACERONE Take 100 mg by mouth daily.  atorvastatin 10 MG tablet Commonly known as:  LIPITOR Take 10 mg by mouth daily.  BIOFREEZE 4 % Gel Generic drug:  Menthol (Topical Analgesic) Apply 1 application topically 2 (two) times daily. Apply to back of neck, lower back, and right leg  BIOTIN 5000 PO Take 5,000 mcg by mouth every evening.  bisacodyl 10 MG suppository Commonly known as:  DULCOLAX Place 10 mg rectally daily as needed for moderate constipation.  calcitRIOL 0.25 MCG capsule Commonly known as:  ROCALTROL Take 0.25 mcg by mouth every morning.  carvedilol 12.5 MG tablet Commonly known as:  COREG Take 1 tablet by mouth 2 (two) times daily.  cetirizine 10 MG tablet Commonly known as:  ZYRTEC Take 10 mg by mouth every morning.  cycloSPORINE 0.05 % ophthalmic emulsion Commonly known as:  RESTASIS Place 1 drop into both eyes 2 (two) times daily as needed (dry eyes.).  docusate sodium 100 MG capsule Commonly known as:  COLACE Take 1 capsule (100 mg total) by mouth 2 (two) times daily.  donepezil 10 MG tablet Commonly known as:   ARICEPT Take 10 mg by mouth at bedtime.  DULoxetine 60 MG capsule Commonly known as:  CYMBALTA Take 60 mg by mouth every evening.  esomeprazole 40 MG capsule Commonly known as:  NEXIUM Take 40 mg by mouth daily at 12 noon.  ferrous sulfate 325 (65 FE) MG tablet Take 325 mg by mouth daily.  hydrALAZINE 25 MG tablet Commonly known as:  APRESOLINE Take 25 mg by mouth 3 (three) times daily.  HYDROcodone-acetaminophen 7.5-325 MG tablet Commonly known as:  NORCO Take one tablet by mouth every 4 hours as needed for moderate pain. Hold for sedation. Do not exceed 3gm of APAP in 24 hours.  ipratropium-albuterol 0.5-2.5 (3) MG/3ML Soln Commonly known as:  DUONEB Take 3 mLs by nebulization daily as needed.  levothyroxine 112 MCG tablet Commonly known as:  SYNTHROID, LEVOTHROID Take 112 mcg by mouth daily.  LINZESS 145 MCG Caps capsule Generic drug:  linaclotide Take 145 mcg by mouth every other day. Alternates with the 290 mg tablet in the am.  linaclotide 290 MCG Caps capsule Commonly known as:  LINZESS Take 290 mcg by mouth every other day. Alternates with the 145 mg tablets in the morning.  memantine 10 MG tablet Commonly known as:  NAMENDA Take 10 mg by mouth 2 (two) times daily.  multivitamin tablet Take 0.5 tablets by mouth daily. Give 1/2 tablet PO QAM for supplement  nitroGLYCERIN 0.4 MG SL tablet Commonly known as:  NITROSTAT Place 0.4 mg under the tongue every 5 (five) minutes as needed for chest pain (MAX 3 TABLETS).  polyethylene glycol packet Commonly known as:  MIRALAX / GLYCOLAX Take 17 g by mouth 2 (two) times daily.  pregabalin 75 MG capsule Commonly known as:  LYRICA Take one capsule by mouth every 12 hours for pains  SALONPAS ARTHRITIS PAIN RELIEF EX Apply 1 patch topically 2 (two) times daily. Monitor for rash or skin irritation  simethicone 80 MG chewable tablet Commonly known as:  MYLICON Chew 80 mg by mouth every 12 (twelve) hours as needed for flatulence.   tiZANidine 4 MG tablet Commonly known as:  ZANAFLEX Take 1 tablet (4 mg total) by mouth every 6 (six) hours as needed for muscle spasms.  torsemide 20 MG  tablet Commonly known as:  DEMADEX Take 20 mg by mouth daily.  Turmeric 500 MG Caps Take 1 capsule by mouth 2 (two) times daily.  ULORIC 40 MG tablet Generic drug:  febuxostat Take 40 mg by mouth every evening.  vitamin B-12 1000 MCG tablet Commonly known as:  CYANOCOBALAMIN Take 1,000 mcg by mouth 2 (two) times a week. Tuesday and Friday  Vitamin D-3 1000 units Caps Take 2 capsules by mouth. Take 2 capsules to = 2000 units PO QHS  warfarin 2 MG tablet Commonly known as:  COUMADIN Take 2 mg by mouth. Take Tue-Thu-Sat  warfarin 2.5 MG tablet Commonly known as:  COUMADIN Take 2.5 mg by mouth. Take on Mon-Wed-Fri-Sun      Immunizations:     Immunization History  Administered Date(s) Administered  . PPD Test 06/29/2015     Physical Exam: Temperature 97.7 pulse 75 respirations 20 blood pressure 129/76 O2 saturation is 94% on room air   General- elderly female, obese, in no acute distress Head- normocephalic, atraumatic  Throat- moist mucus membrane, has upper and lower dentures Eyes- PERRLA, EOMI, no pallor, no icterus Neck- no cervical lymphadenopathy Cardiovascular- normal s1,s2, no murmur, 1+ leg edema Respiratory- minute amount of expiratory wheezing posterior lung fields, no rhonchi, no crackles, no use of accessory muscles Abdomen- bowel sounds   present, soft, non tender Musculoskeletal- able to move all 4 extremities, on wheelchair does ambulate with walker with therapy Neurological- no lateralizing findings her speech is clear  Skin- warm and dry Psychiatry- normal mood and affect    Labs reviewed:  01/16/2016.  Sodium 142 potassium 4.1 BUN 74 creatinine 3.09 Basic Metabolic Panel:  Recent Labs (within last 365 days)   Recent Labs  06/27/15 0403 06/28/15 1221  06/29/15 0334   10/01/15 10/08/15 01/07/16  NA 142 141  < > 136  < > 143 146 144  K 4.3 4.3  --  4.0  < > 3.7 3.8 3.7  CL 105 106  --  102  --   --   --   --   CO2 28 27  --  26  --   --   --   --   GLUCOSE 104* 106*  --  106*  --   --   --   --   BUN 61* 76*  < > 83*  < > 55* 46* 72*  CREATININE 3.33* 3.37*  < > 2.93*  < > 2.4* 2.0* 2.5*  CALCIUM 8.8* 9.0  --  8.6*  --   --   --   --   < > = values in this interval not displayed.   Liver Function Tests:  Recent Labs (within last 365 days)   Recent Labs  07/03/15  AST 15  ALT 4*  ALKPHOS 70     Recent Labs (within last 365 days)  No results for input(s): LIPASE, AMYLASE in the last 8760 hours.   Recent Labs (within last 365 days)  No results for input(s): AMMONIA in the last 8760 hours.   CBC:  Recent Labs (within last 365 days)   Recent Labs  06/27/15 0403 06/28/15 1221  06/29/15 0334 07/03/15 07/13/15 08/29/15  WBC 7.1 7.2  < > 6.4 5.7 6.4 4.5  NEUTROABS  --   --   --   --  3 4 2   HGB 9.4* 10.1*  --  9.4* 9.1* 9.9* 10.6*  HCT 30.2* 31.6*  --  28.1* 28* 30* 34*  MCV  93.5 92.9  --  87.5  --   --   --   PLT 209 208  --  200 229 292 176  < > = values in this interval not displayed.     Assessment and plan.  #1-history of chronic kidney disease with rising creatinine-creatinine is now 3.09 this appears to be gradually rising a nephrology consult is pending-clinically she appears to be stable.  I note she has a history CHF is on torsemide 20 mg twice a day we'll cautiously reduce this to once a day-and have her weights monitored Monday Wednesday Friday notify provider of gain greater than 3 pounds.  Also will update a metabolic panel next week to keep an eye on this and hopefully a nephrology consult can be arranged fairly expedient manner.  In regards to some history wheezing patient reports this is not any more so than usual-she is not complaining of shortness of breath-she does have a when necessary duo neb once a day  will increase this to 3 times a day when necessary and monitor.  Vital signs and O2 saturation continued to be stable.  [id number]

## 2016-01-18 DIAGNOSIS — R262 Difficulty in walking, not elsewhere classified: Secondary | ICD-10-CM | POA: Diagnosis not present

## 2016-01-18 DIAGNOSIS — M6281 Muscle weakness (generalized): Secondary | ICD-10-CM | POA: Diagnosis not present

## 2016-01-19 DIAGNOSIS — R262 Difficulty in walking, not elsewhere classified: Secondary | ICD-10-CM | POA: Diagnosis not present

## 2016-01-19 DIAGNOSIS — M6281 Muscle weakness (generalized): Secondary | ICD-10-CM | POA: Diagnosis not present

## 2016-01-21 DIAGNOSIS — D649 Anemia, unspecified: Secondary | ICD-10-CM | POA: Diagnosis not present

## 2016-01-21 DIAGNOSIS — R262 Difficulty in walking, not elsewhere classified: Secondary | ICD-10-CM | POA: Diagnosis not present

## 2016-01-21 DIAGNOSIS — M6281 Muscle weakness (generalized): Secondary | ICD-10-CM | POA: Diagnosis not present

## 2016-01-22 DIAGNOSIS — R262 Difficulty in walking, not elsewhere classified: Secondary | ICD-10-CM | POA: Diagnosis not present

## 2016-01-22 DIAGNOSIS — M6281 Muscle weakness (generalized): Secondary | ICD-10-CM | POA: Diagnosis not present

## 2016-01-23 DIAGNOSIS — M6281 Muscle weakness (generalized): Secondary | ICD-10-CM | POA: Diagnosis not present

## 2016-01-23 DIAGNOSIS — R262 Difficulty in walking, not elsewhere classified: Secondary | ICD-10-CM | POA: Diagnosis not present

## 2016-01-24 DIAGNOSIS — R262 Difficulty in walking, not elsewhere classified: Secondary | ICD-10-CM | POA: Diagnosis not present

## 2016-01-24 DIAGNOSIS — M6281 Muscle weakness (generalized): Secondary | ICD-10-CM | POA: Diagnosis not present

## 2016-01-25 DIAGNOSIS — M6281 Muscle weakness (generalized): Secondary | ICD-10-CM | POA: Diagnosis not present

## 2016-01-25 DIAGNOSIS — R262 Difficulty in walking, not elsewhere classified: Secondary | ICD-10-CM | POA: Diagnosis not present

## 2016-01-28 DIAGNOSIS — M6281 Muscle weakness (generalized): Secondary | ICD-10-CM | POA: Diagnosis not present

## 2016-01-28 DIAGNOSIS — R262 Difficulty in walking, not elsewhere classified: Secondary | ICD-10-CM | POA: Diagnosis not present

## 2016-01-29 DIAGNOSIS — R262 Difficulty in walking, not elsewhere classified: Secondary | ICD-10-CM | POA: Diagnosis not present

## 2016-01-29 DIAGNOSIS — M6281 Muscle weakness (generalized): Secondary | ICD-10-CM | POA: Diagnosis not present

## 2016-01-30 DIAGNOSIS — R262 Difficulty in walking, not elsewhere classified: Secondary | ICD-10-CM | POA: Diagnosis not present

## 2016-01-30 DIAGNOSIS — M6281 Muscle weakness (generalized): Secondary | ICD-10-CM | POA: Diagnosis not present

## 2016-01-31 DIAGNOSIS — M6281 Muscle weakness (generalized): Secondary | ICD-10-CM | POA: Diagnosis not present

## 2016-01-31 DIAGNOSIS — R262 Difficulty in walking, not elsewhere classified: Secondary | ICD-10-CM | POA: Diagnosis not present

## 2016-02-01 ENCOUNTER — Encounter: Payer: Self-pay | Admitting: Adult Health

## 2016-02-01 ENCOUNTER — Non-Acute Institutional Stay (SKILLED_NURSING_FACILITY): Payer: Medicare Other | Admitting: Adult Health

## 2016-02-01 DIAGNOSIS — M1A9XX0 Chronic gout, unspecified, without tophus (tophi): Secondary | ICD-10-CM | POA: Diagnosis not present

## 2016-02-01 DIAGNOSIS — F0390 Unspecified dementia without behavioral disturbance: Secondary | ICD-10-CM

## 2016-02-01 DIAGNOSIS — K219 Gastro-esophageal reflux disease without esophagitis: Secondary | ICD-10-CM

## 2016-02-01 DIAGNOSIS — M6281 Muscle weakness (generalized): Secondary | ICD-10-CM | POA: Diagnosis not present

## 2016-02-01 DIAGNOSIS — E038 Other specified hypothyroidism: Secondary | ICD-10-CM

## 2016-02-01 DIAGNOSIS — M792 Neuralgia and neuritis, unspecified: Secondary | ICD-10-CM | POA: Diagnosis not present

## 2016-02-01 DIAGNOSIS — M159 Polyosteoarthritis, unspecified: Secondary | ICD-10-CM | POA: Diagnosis not present

## 2016-02-01 DIAGNOSIS — J309 Allergic rhinitis, unspecified: Secondary | ICD-10-CM | POA: Diagnosis not present

## 2016-02-01 DIAGNOSIS — I10 Essential (primary) hypertension: Secondary | ICD-10-CM | POA: Diagnosis not present

## 2016-02-01 DIAGNOSIS — E785 Hyperlipidemia, unspecified: Secondary | ICD-10-CM | POA: Diagnosis not present

## 2016-02-01 DIAGNOSIS — I480 Paroxysmal atrial fibrillation: Secondary | ICD-10-CM

## 2016-02-01 DIAGNOSIS — I25119 Atherosclerotic heart disease of native coronary artery with unspecified angina pectoris: Secondary | ICD-10-CM

## 2016-02-01 DIAGNOSIS — D638 Anemia in other chronic diseases classified elsewhere: Secondary | ICD-10-CM

## 2016-02-01 DIAGNOSIS — S86811S Strain of other muscle(s) and tendon(s) at lower leg level, right leg, sequela: Secondary | ICD-10-CM

## 2016-02-01 DIAGNOSIS — M810 Age-related osteoporosis without current pathological fracture: Secondary | ICD-10-CM | POA: Diagnosis not present

## 2016-02-01 DIAGNOSIS — K5909 Other constipation: Secondary | ICD-10-CM | POA: Diagnosis not present

## 2016-02-01 DIAGNOSIS — N184 Chronic kidney disease, stage 4 (severe): Secondary | ICD-10-CM | POA: Diagnosis not present

## 2016-02-01 DIAGNOSIS — I5032 Chronic diastolic (congestive) heart failure: Secondary | ICD-10-CM

## 2016-02-01 DIAGNOSIS — R262 Difficulty in walking, not elsewhere classified: Secondary | ICD-10-CM | POA: Diagnosis not present

## 2016-02-01 NOTE — Progress Notes (Signed)
Patient ID: [name], female   DOB: [birth year], 82 y.o.   MRN: [id number]    DATE:    02/01/16  MRN:  [id number]  BIRTHDAY: [birth year]  Facility:  Nursing Home Location:  [facility]  Nursing Home Room Number: 905-B  LEVEL OF CARE:  SNF [phone])  Contact Information    Name Relation Home Work [place] [phone]  [phone]   Gray,Sharon Daughter [phone]  [phone]       Code Status History    Date Active Date Inactive Code Status Order ID Comments User Context   03/08/2015  8:25 PM 03/12/2015  5:09 PM Full Code [id number]  Lanney Gins, PA-C Inpatient   12/15/2014 12:39 AM 12/15/2014  6:36 PM Full Code [id number]  Yevonne Pax, MD Inpatient   12/04/2014  4:00 PM 12/07/2014  5:13 PM Full Code [id number]  Lanney Gins, PA-C Inpatient   12/21/2012  3:56 PM 12/28/2012  7:34 PM DNR [id number]  Tonny Bollman, MD Inpatient      Chief Complaint  Patient presents with  . Medical management of chronic illnesses    HISTORY OF PRESENT ILLNESS:  This is an 82 year old female who is being seen for a routine visit. She is here fo a long-term care. No SOB. CHF has been stable. She has no complaints of palpitations. Latest creatinine 2.22, CKD stage 4.  Namenda was decreased to 5 mg daily. Torsemide was decreased to 20 mg daily.   PAST MEDICAL HISTORY:  Past Medical History:  Diagnosis Date  . Acute on chronic renal failure [facility])    sees Dr Allena Katz   . Anemia    Acute blood loss  . Aortic regurgitation   . Aortic stenosis 10/13/2012   Low EF, low gradient with severe aortic stenosis confirmed by dobutamine stress echocardiogram s/p TAVR 12/2012  . Asthma   . Atrial fibrillation (HCC)    tachy-brady syndrome with <1% recurrent PAF since pacemaker placement  . Cataracts, bilateral   . Chronic combined systolic and diastolic CHF (congestive heart failure) (HCC)   . Chronic lower back pain   . CKD (chronic kidney disease)   . Coronary artery  disease involving native coronary artery of native heart   . Dementia    Without behavioral disturbance  . Dysrhythmia   . Fibromyalgia   . Gastroesophageal reflux disease   . H/O dizziness   . H/O urinary frequency   . H/O: stroke   . Hard of hearing   . Headache    hx of migraines   . Heart murmur   . History of blood transfusion   . History of bronchitis   . History of kidney stones   . History of urinary tract infection   . HLD (hyperlipidemia)   . HTN (hypertension)   . Hypothyroidism   . Neuropathy (HCC)   . Nonischemic cardiomyopathy (HCC)   . On home oxygen therapy    patient uses at nite- 2L- has not used in > 6 months per patient  . Osteoarthritis   . Osteoporosis   . Pneumonia    hx of x 3   . PONV (postoperative nausea and vomiting)   . Presence of permanent cardiac pacemaker   . Pulmonary embolism (HCC)    HISTORY OF, the pt. had a recurrent bilateral pulmonary emboli in 2005, on warfarin therapy and at which time she under went implantation of IVC filter  . Repeated falls   . Rupture of right  patellar tendon   . Sciatica   . Shortness of breath dyspnea    with exertion   . Sleep apnea    uses oxygen at night and PRN- not used since > 6 months / DOES NOT USE  C-PAP  . Spinal stenosis   . Stroke (HCC)   . Symptomatic bradycardia   . Symptomatic bradycardia 2012   s/p Medtronic PPM  . Syncope   . Urinary incontinence   . Urinary urgency      CURRENT MEDICATIONS: Reviewed  Patient's Medications  New Prescriptions   No medications on file  Previous Medications   ALENDRONATE (FOSAMAX) 70 MG/75ML SOLUTION    Take 70 mg by mouth every 7 (seven) days. Take with a full glass of water on an empty stomach on Saturday   AMIODARONE (PACERONE) 100 MG TABLET    Take 100 mg by mouth daily.   ATORVASTATIN (LIPITOR) 10 MG TABLET    Take 10 mg by mouth daily.   BIOTIN 5000 PO    Take 5,000 mcg by mouth every evening.    BISACODYL (DULCOLAX) 10 MG SUPPOSITORY     Place 10 mg rectally daily as needed for moderate constipation.   CALCITRIOL (ROCALTROL) 0.25 MCG CAPSULE    Take 0.25 mcg by mouth every morning.    CARVEDILOL (COREG) 12.5 MG TABLET    Take 1 tablet by mouth 2 (two) times daily.    CETIRIZINE (ZYRTEC) 10 MG TABLET    Take 10 mg by mouth every morning.    CHOLECALCIFEROL (VITAMIN D-3) 1000 UNITS CAPS    Take 2 capsules by mouth. Take 2 capsules to = 2000 units PO QHS   CYCLOSPORINE (RESTASIS) 0.05 % OPHTHALMIC EMULSION    Place 1 drop into both eyes 2 (two) times daily as needed (dry eyes.).   DONEPEZIL (ARICEPT) 10 MG TABLET    Take 10 mg by mouth at bedtime.    DULOXETINE (CYMBALTA) 60 MG CAPSULE    Take 60 mg by mouth every evening.    ESOMEPRAZOLE (NEXIUM) 40 MG CAPSULE    Take 40 mg by mouth daily at 12 noon.   FERROUS SULFATE 325 (65 FE) MG TABLET    Take 325 mg by mouth daily.    HYDRALAZINE (APRESOLINE) 25 MG TABLET    Take 25 mg by mouth 3 (three) times daily.   HYDROCODONE-ACETAMINOPHEN (NORCO) 7.5-325 MG TABLET    Take 1-2 tablets by mouth every 4 (four) hours as needed for moderate pain.   IPRATROPIUM-ALBUTEROL (DUONEB) 0.5-2.5 (3) MG/3ML SOLN    Take 3 mLs by nebulization every 8 (eight) hours as needed.    LEVOTHYROXINE (SYNTHROID, LEVOTHROID) 112 MCG TABLET    Take 112 mcg by mouth daily.    LINACLOTIDE (LINZESS) 145 MCG CAPS CAPSULE    Take 145 mcg by mouth every other day.    MEMANTINE (NAMENDA) 5 MG TABLET    Take 5 mg by mouth.   MENTHOL, TOPICAL ANALGESIC, (BIOFREEZE) 4 % GEL    Apply 1 application topically 2 (two) times daily. Apply to back of neck, lower back, and right leg   MULTIPLE VITAMIN (MULTIVITAMIN) TABLET    Take 1 tablet by mouth daily. Give 1/2 tablet PO QAM for supplement   NITROGLYCERIN (NITROSTAT) 0.4 MG SL TABLET    Place 0.4 mg under the tongue every 5 (five) minutes as needed for chest pain (MAX 3 TABLETS).    POLYETHYLENE GLYCOL (MIRALAX / GLYCOLAX) PACKET    Take 17  g by mouth 2 (two) times daily.    PREGABALIN (LYRICA) 50 MG CAPSULE    Take 50 mg by mouth 3 (three) times daily. 6AM, 2PM, 10PM   SENNA (SENOKOT) 8.6 MG TABS TABLET    Take 2 tablets by mouth 2 (two) times daily.   SIMETHICONE (MYLICON) 80 MG CHEWABLE TABLET    Chew 80 mg by mouth every 12 (twelve) hours as needed for flatulence.    TIZANIDINE (ZANAFLEX) 4 MG TABLET    Take 1 tablet (4 mg total) by mouth every 6 (six) hours as needed for muscle spasms.   TORSEMIDE (DEMADEX) 20 MG TABLET    Take 20 mg by mouth daily.    TURMERIC 500 MG CAPS    Take 1 capsule by mouth 2 (two) times daily.   ULORIC 40 MG TABLET    Take 40 mg by mouth every evening.    VITAMIN B-12 (CYANOCOBALAMIN) 1000 MCG TABLET    Take 1,000 mcg by mouth 2 (two) times a week. Tuesday and Friday   WARFARIN (COUMADIN) 2 MG TABLET    Take 2 mg by mouth. Take Tue-Wed-Thu-Sat-Sun   WARFARIN (COUMADIN) 2.5 MG TABLET    Take 2.5 mg by mouth. Take on Monday and Friday  Modified Medications   No medications on file  Discontinued Medications   No medications on file     Allergies  Allergen Reactions  . Nubain [Nalbuphine Hcl] Hives    Went into cardiac arrest   . Codeine Hives and Nausea Only  . Darvocet [Propoxyphene N-Acetaminophen] Nausea Only     REVIEW OF SYSTEMS:  GENERAL: no change in appetite, no fatigue, no weight changes, no fever, chills or weakness EYES: Denies change in vision, dry eyes, eye pain, itching or discharge EARS: Denies change in hearing, ringing in ears, or earache NOSE: Denies nasal congestion or epistaxis MOUTH and THROAT: Denies oral discomfort, gingival pain or bleeding, pain from teeth or hoarseness   RESPIRATORY: no cough, SOB, DOE, wheezing, hemoptysis CARDIAC: no chest pain, edema or palpitations GI: no abdominal pain, diarrhea, heart burn, nausea or vomiting, +constipation GU: Denies dysuria, frequency, hematuria, incontinence, or discharge PSYCHIATRIC: Denies feeling of depression or anxiety. No report of hallucinations,  insomnia, paranoia, or agitation   PHYSICAL EXAMINATION  GENERAL APPEARANCE: Well nourished. In no acute distress. Obese SKIN:  Skin is warm and dry HEAD: Normal in size and contour. No evidence of trauma EYES: Lids open and close normally. No blepharitis, entropion or ectropion. PERRL. Conjunctivae are clear and sclerae are white. Lenses are without opacity EARS: Pinnae are normal. Patient hears normal voice tunes of the examiner MOUTH and THROAT: Lips are without lesions. Oral mucosa is moist and without lesions. Tongue is normal in shape, size, and color and without lesions NECK: supple, trachea midline, no neck masses, no thyroid tenderness, no thyromegaly LYMPHATICS: no LAN in the neck, no supraclavicular LAN RESPIRATORY: breathing is even & unlabored, BS CTAB CARDIAC: Irregularly irregular, no murmur,no extra heart sounds, no edema GI: abdomen soft, normal BS, no masses, no tenderness, no hepatomegaly, no splenomegaly EXTREMITIES: : Able to move X 4 extremities PSYCHIATRIC: Alert and oriented X 3. Affect and behavior are appropriate  LABS/RADIOLOGY: Labs reviewed: 01/21/16  Na 146  K 3.7  Glucose 81  BUN 45  Creatinine 2.22  Ca 9.9  GFR 22.49 01/16/16   Na 142  K 3.7  Glucose 82   BUN 74  Creatinine 3.09  Ca 9.3 GFR 15.36 11/01/15  WBC  6.5  hemoglobin 11.6 hematocrit 35.8 MCV 94.2 platelet 184 Basic Metabolic Panel:  Recent Labs  16/10/96 0403 06/28/15 1221  06/29/15 0334  10/01/15 10/08/15 01/07/16  NA 142 141  < > 136  < > 143 146 144  K 4.3 4.3  --  4.0  < > 3.7 3.8 3.7  CL 105 106  --  102  --   --   --   --   CO2 28 27  --  26  --   --   --   --   GLUCOSE 104* 106*  --  106*  --   --   --   --   BUN 61* 76*  < > 83*  < > 55* 46* 72*  CREATININE 3.33* 3.37*  < > 2.93*  < > 2.4* 2.0* 2.5*  CALCIUM 8.8* 9.0  --  8.6*  --   --   --   --   < > = values in this interval not displayed. Liver Function Tests:  Recent Labs  07/03/15  AST 15  ALT 4*  ALKPHOS 70    CBC:  Recent Labs  06/27/15 0403 06/28/15 1221  06/29/15 0334 07/03/15 07/13/15 08/29/15  WBC 7.1 7.2  < > 6.4 5.7 6.4 4.5  NEUTROABS  --   --   --   --  3 4 2   HGB 9.4* 10.1*  --  9.4* 9.1* 9.9* 10.6*  HCT 30.2* 31.6*  --  28.1* 28* 30* 34*  MCV 93.5 92.9  --  87.5  --   --   --   PLT 209 208  --  200 229 292 176  < > = values in this interval not displayed.    ASSESSMENT/PLAN:   Anemia of chronic disease - hgb 11.6; continue Iron 325 mg 1 tab PO Q D  Right patella tendon rupture S/P repair - follow-up with Dr. Charlann Boxer, orthopedics; fall precautions; Norco 7.5/325mg  1-2 tabs Q 4 hours PRN for pain; Tizanidine 4 mg 1 tab PO Q 6 hours PRN for muscle spasm  Hypothyroidism - continue Synthroid 112 g 1 tab by mouth daily Lab Results  Component Value Date   TSH 1.11 01/07/2016    Atrial fibrillation - rate controlled. continue coreg 12.5 mg bid, amiodarone 100 mg daily and Coumadin  CAD - stable; continue coreg, hydralazine, prn NTG, Atorvastatin 10 mg daily  Osteoporosis - Continue weekly fosamax and calcitriol ; fall precautions  Allergic rhinitis - continue cetirizine 10 mg daily   GERD - stable, continue Nexium 40 mg daily  Hypertension with renal disease - continue hydralazine 25 mg tid with coreg 12.5 mg bid  CKD stage 4 - creatinine 2.22; referred to nephrology  Dementia - Continue donepezil  10 mg 1 tab by mouth daily at bedtime and memantine 5 mg 1 tab by mouth twice a day  Chronic constipation - continue Senna-S 8.6-50 mg take 2 tabs BID, Linzess 145 MCG  Daily, Dulcolax 10 mg 1 suppository rectally Q D PRN and miralax 17 gm bid.  Chronic diastolic heart failure - no SOB; continue torsemide 20 mg 1 tab by mouth daily, coreg 12.5 mg 1 tab by mouth twice a day and hydralazine 25 mg 1 tab by mouth 3 times a day  Neuropathy - continue Lyrica 50 mg tid, Salonpas patch BID and cymbalta 60 mg daily  Hyperlipidemia - continue Atorvastatin 10 mg daily Lab Results   Component Value Date   CHOL 153 10/01/2015  HDL 36 10/01/2015   LDLCALC 67 10/01/2015   TRIG 247 (A) 10/01/2015   CHOLHDL 1.8 06/16/2010   Gout - continue Uloric 40 mg 1 tab by mouth every evening  Osteoarthrosis - continue Biofreeze 4% gel topically to neck, lower back and leg BID     Goals of care:  Long-term care    Kenard Gower, NP [facility] [phone]

## 2016-02-04 DIAGNOSIS — R262 Difficulty in walking, not elsewhere classified: Secondary | ICD-10-CM | POA: Diagnosis not present

## 2016-02-04 DIAGNOSIS — M6281 Muscle weakness (generalized): Secondary | ICD-10-CM | POA: Diagnosis not present

## 2016-02-05 DIAGNOSIS — M6281 Muscle weakness (generalized): Secondary | ICD-10-CM | POA: Diagnosis not present

## 2016-02-05 DIAGNOSIS — Z23 Encounter for immunization: Secondary | ICD-10-CM | POA: Diagnosis not present

## 2016-02-05 DIAGNOSIS — H6123 Impacted cerumen, bilateral: Secondary | ICD-10-CM | POA: Diagnosis not present

## 2016-02-05 DIAGNOSIS — R262 Difficulty in walking, not elsewhere classified: Secondary | ICD-10-CM | POA: Diagnosis not present

## 2016-02-06 DIAGNOSIS — R262 Difficulty in walking, not elsewhere classified: Secondary | ICD-10-CM | POA: Diagnosis not present

## 2016-02-06 DIAGNOSIS — M6281 Muscle weakness (generalized): Secondary | ICD-10-CM | POA: Diagnosis not present

## 2016-02-07 DIAGNOSIS — R262 Difficulty in walking, not elsewhere classified: Secondary | ICD-10-CM | POA: Diagnosis not present

## 2016-02-07 DIAGNOSIS — M6281 Muscle weakness (generalized): Secondary | ICD-10-CM | POA: Diagnosis not present

## 2016-02-08 DIAGNOSIS — R262 Difficulty in walking, not elsewhere classified: Secondary | ICD-10-CM | POA: Diagnosis not present

## 2016-02-08 DIAGNOSIS — M6281 Muscle weakness (generalized): Secondary | ICD-10-CM | POA: Diagnosis not present

## 2016-02-11 DIAGNOSIS — R262 Difficulty in walking, not elsewhere classified: Secondary | ICD-10-CM | POA: Diagnosis not present

## 2016-02-11 DIAGNOSIS — M6281 Muscle weakness (generalized): Secondary | ICD-10-CM | POA: Diagnosis not present

## 2016-02-26 DIAGNOSIS — M545 Low back pain: Secondary | ICD-10-CM | POA: Diagnosis not present

## 2016-02-26 DIAGNOSIS — M5441 Lumbago with sciatica, right side: Secondary | ICD-10-CM | POA: Diagnosis not present

## 2016-02-26 DIAGNOSIS — M25532 Pain in left wrist: Secondary | ICD-10-CM | POA: Diagnosis not present

## 2016-02-26 DIAGNOSIS — Z9181 History of falling: Secondary | ICD-10-CM | POA: Diagnosis not present

## 2016-02-26 DIAGNOSIS — M25551 Pain in right hip: Secondary | ICD-10-CM | POA: Diagnosis not present

## 2016-02-28 ENCOUNTER — Encounter: Payer: Self-pay | Admitting: Adult Health

## 2016-02-28 ENCOUNTER — Non-Acute Institutional Stay (SKILLED_NURSING_FACILITY): Payer: Medicare Other | Admitting: Adult Health

## 2016-02-28 DIAGNOSIS — I480 Paroxysmal atrial fibrillation: Secondary | ICD-10-CM | POA: Diagnosis not present

## 2016-02-28 DIAGNOSIS — N184 Chronic kidney disease, stage 4 (severe): Secondary | ICD-10-CM | POA: Diagnosis not present

## 2016-02-28 DIAGNOSIS — I5032 Chronic diastolic (congestive) heart failure: Secondary | ICD-10-CM

## 2016-02-28 DIAGNOSIS — E785 Hyperlipidemia, unspecified: Secondary | ICD-10-CM

## 2016-02-28 DIAGNOSIS — M1A9XX0 Chronic gout, unspecified, without tophus (tophi): Secondary | ICD-10-CM | POA: Diagnosis not present

## 2016-02-28 DIAGNOSIS — K219 Gastro-esophageal reflux disease without esophagitis: Secondary | ICD-10-CM

## 2016-02-28 DIAGNOSIS — K5909 Other constipation: Secondary | ICD-10-CM

## 2016-02-28 DIAGNOSIS — S86811S Strain of other muscle(s) and tendon(s) at lower leg level, right leg, sequela: Secondary | ICD-10-CM | POA: Diagnosis not present

## 2016-02-28 DIAGNOSIS — F0390 Unspecified dementia without behavioral disturbance: Secondary | ICD-10-CM

## 2016-02-28 DIAGNOSIS — M810 Age-related osteoporosis without current pathological fracture: Secondary | ICD-10-CM

## 2016-02-28 DIAGNOSIS — E538 Deficiency of other specified B group vitamins: Secondary | ICD-10-CM | POA: Diagnosis not present

## 2016-02-28 DIAGNOSIS — M159 Polyosteoarthritis, unspecified: Secondary | ICD-10-CM | POA: Diagnosis not present

## 2016-02-28 DIAGNOSIS — I25119 Atherosclerotic heart disease of native coronary artery with unspecified angina pectoris: Secondary | ICD-10-CM

## 2016-02-28 DIAGNOSIS — M792 Neuralgia and neuritis, unspecified: Secondary | ICD-10-CM | POA: Diagnosis not present

## 2016-02-28 DIAGNOSIS — I10 Essential (primary) hypertension: Secondary | ICD-10-CM

## 2016-02-28 DIAGNOSIS — J309 Allergic rhinitis, unspecified: Secondary | ICD-10-CM

## 2016-02-28 NOTE — Progress Notes (Signed)
Patient ID: [name], female   DOB: December 24, 1932, 82 y.o.   MRN: [id number]    DATE:    02/28/16  MRN:  [id number]  BIRTHDAY: December 24, 1932  Facility:  Nursing Home Location:  [facility]  Nursing Home Room Number: 905-B  LEVEL OF CARE:  SNF [phone])  Contact Information    Name Relation Home Work [place]   [place],David Son [phone]  [phone]   Gray,Sharon Daughter [phone]  [phone]       Code Status History    Date Active Date Inactive Code Status Order ID Comments User Context   03/08/2015  8:25 PM 03/12/2015  5:09 PM Full Code [id number]  Lanney GinsMatthew Babish, PA-C Inpatient   12/15/2014 12:39 AM 12/15/2014  6:36 PM Full Code [id number]  Yevonne PaxSaadat A Khan, MD Inpatient   12/04/2014  4:00 PM 12/07/2014  5:13 PM Full Code [id number]  Lanney GinsMatthew Babish, PA-C Inpatient   12/21/2012  3:56 PM 12/28/2012  7:34 PM DNR [id number]  Tonny BollmanMichael Cooper, MD Inpatient      Chief Complaint  Patient presents with  . Medical management of chronic illnesses    HISTORY OF PRESENT ILLNESS:  This is an 82 year old female who is being seen for a routine visit. She is here fo long-term care. She has been stable for the past month.   PAST MEDICAL HISTORY:  Past Medical History:  Diagnosis Date  . Acute on chronic renal failure [facility](HCC)    sees Dr Allena KatzPatel   . Anemia    Acute blood loss  . Aortic regurgitation   . Aortic stenosis 10/13/2012   Low EF, low gradient with severe aortic stenosis confirmed by dobutamine stress echocardiogram s/p TAVR 12/2012  . Asthma   . Atrial fibrillation (HCC)    tachy-brady syndrome with <1% recurrent PAF since pacemaker placement  . Cataracts, bilateral   . Chronic combined systolic and diastolic CHF (congestive heart failure) (HCC)   . Chronic lower back pain   . CKD (chronic kidney disease)   . Coronary artery disease involving native coronary artery of native heart   . Dementia    Without behavioral disturbance  . Dysrhythmia   . Fibromyalgia   .  Gastroesophageal reflux disease   . H/O dizziness   . H/O urinary frequency   . H/O: stroke   . Hard of hearing   . Headache    hx of migraines   . Heart murmur   . History of blood transfusion   . History of bronchitis   . History of kidney stones   . History of urinary tract infection   . HLD (hyperlipidemia)   . HTN (hypertension)   . Hypothyroidism   . Neuropathy (HCC)   . Nonischemic cardiomyopathy (HCC)   . On home oxygen therapy    patient uses at nite- 2L- has not used in > 6 months per patient  . Osteoarthritis   . Osteoporosis   . Pneumonia    hx of x 3   . PONV (postoperative nausea and vomiting)   . Presence of permanent cardiac pacemaker   . Pulmonary embolism (HCC)    HISTORY OF, the pt. had a recurrent bilateral pulmonary emboli in 2005, on warfarin therapy and at which time she under went implantation of IVC filter  . Repeated falls   . Rupture of right patellar tendon   . Sciatica   . Shortness of breath dyspnea    with exertion   . Sleep apnea  uses oxygen at night and PRN- not used since > 6 months / DOES NOT USE  C-PAP  . Spinal stenosis   . Stroke (HCC)   . Symptomatic bradycardia   . Symptomatic bradycardia 2012   s/p Medtronic PPM  . Syncope   . Urinary incontinence   . Urinary urgency      CURRENT MEDICATIONS: Reviewed  Patient's Medications  New Prescriptions   No medications on file  Previous Medications   ALENDRONATE (FOSAMAX) 70 MG/75ML SOLUTION    Take 70 mg by mouth every 7 (seven) days. Take with a full glass of water on an empty stomach on Saturday   AMIODARONE (PACERONE) 100 MG TABLET    Take 100 mg by mouth daily.   ATORVASTATIN (LIPITOR) 10 MG TABLET    Take 10 mg by mouth daily.   BIOTIN 5000 PO    Take 5,000 mcg by mouth every evening.    BISACODYL (DULCOLAX) 10 MG SUPPOSITORY    Place 10 mg rectally daily as needed for moderate constipation.   CALCITRIOL (ROCALTROL) 0.25 MCG CAPSULE    Take 0.25 mcg by mouth every morning.     CARVEDILOL (COREG) 12.5 MG TABLET    Take 1 tablet by mouth 2 (two) times daily.    CETIRIZINE (ZYRTEC) 10 MG TABLET    Take 10 mg by mouth daily.    CHOLECALCIFEROL (VITAMIN D-3) 1000 UNITS CAPS    Take 2 capsules by mouth. Take 2 capsules to = 2000 units PO QHS   CYCLOSPORINE (RESTASIS) 0.05 % OPHTHALMIC EMULSION    Place 1 drop into both eyes 2 (two) times daily as needed (dry eyes.).   DONEPEZIL (ARICEPT) 10 MG TABLET    Take 10 mg by mouth at bedtime.    DULOXETINE (CYMBALTA) 60 MG CAPSULE    Take 60 mg by mouth every evening.    ESOMEPRAZOLE (NEXIUM) 40 MG CAPSULE    Take 40 mg by mouth daily at 12 noon.   FERROUS SULFATE 325 (65 FE) MG TABLET    Take 325 mg by mouth daily.    HYDRALAZINE (APRESOLINE) 25 MG TABLET    Take 25 mg by mouth 3 (three) times daily.   HYDROCODONE-ACETAMINOPHEN (NORCO) 7.5-325 MG TABLET    Take 1-2 tablets by mouth every 4 (four) hours as needed for moderate pain.   IPRATROPIUM-ALBUTEROL (DUONEB) 0.5-2.5 (3) MG/3ML SOLN    Take 3 mLs by nebulization every 8 (eight) hours as needed.    LEVOTHYROXINE (SYNTHROID, LEVOTHROID) 112 MCG TABLET    Take 112 mcg by mouth daily.    LINACLOTIDE (LINZESS) 145 MCG CAPS CAPSULE    Take 145 mcg by mouth daily.    MEMANTINE (NAMENDA) 5 MG TABLET    Take 5 mg by mouth 2 (two) times daily.    MENTHOL (ICY HOT) 5 % PTCH    Apply 1 each topically. Apply 1/2 patch to each knee QAM and remove at HS.  Monitor skin for rash or irritation.   MENTHOL, TOPICAL ANALGESIC, (BIOFREEZE) 4 % GEL    Apply 1 application topically 2 (two) times daily. Apply to back of neck, lower back, and right leg   MULTIPLE VITAMIN (MULTIVITAMIN) TABLET    Take 1 tablet by mouth daily.    NITROGLYCERIN (NITROSTAT) 0.4 MG SL TABLET    Place 0.4 mg under the tongue every 5 (five) minutes as needed for chest pain (MAX 3 TABLETS).    POLYETHYLENE GLYCOL (MIRALAX / GLYCOLAX) PACKET  Take 17 g by mouth 2 (two) times daily.   PREGABALIN (LYRICA) 50 MG CAPSULE     Take 50 mg by mouth 3 (three) times daily. 6AM, 2PM, 10PM   SENNA (SENOKOT) 8.6 MG TABS TABLET    Take 2 tablets by mouth 2 (two) times daily.   SIMETHICONE (MYLICON) 80 MG CHEWABLE TABLET    Chew 80 mg by mouth every 12 (twelve) hours as needed for flatulence.    TIZANIDINE (ZANAFLEX) 4 MG TABLET    Take 1 tablet (4 mg total) by mouth every 6 (six) hours as needed for muscle spasms.   TORSEMIDE (DEMADEX) 20 MG TABLET    Take 20 mg by mouth daily.    TURMERIC 500 MG CAPS    Take 1 capsule by mouth 2 (two) times daily.   ULORIC 40 MG TABLET    Take 40 mg by mouth every evening.    VITAMIN B-12 (CYANOCOBALAMIN) 1000 MCG TABLET    Take 1,000 mcg by mouth 2 (two) times a week. Tuesday and Friday   WARFARIN (COUMADIN) 2 MG TABLET    Take 2 mg by mouth. Take Tue-Wed-Thu-Sat-Sun   WARFARIN (COUMADIN) 2.5 MG TABLET    Take 2.5 mg by mouth. Take on Monday and Friday  Modified Medications   No medications on file  Discontinued Medications   No medications on file     Allergies  Allergen Reactions  . Nubain [Nalbuphine Hcl] Hives    Went into cardiac arrest   . Codeine Hives and Nausea Only  . Darvocet [Propoxyphene N-Acetaminophen] Nausea Only     REVIEW OF SYSTEMS:  GENERAL: no change in appetite, no fatigue, no weight changes, no fever, chills or weakness EYES: Denies change in vision, dry eyes, eye pain, itching or discharge EARS: Denies change in hearing, ringing in ears, or earache NOSE: Denies nasal congestion or epistaxis MOUTH and THROAT: Denies oral discomfort, gingival pain or bleeding, pain from teeth or hoarseness   RESPIRATORY: no cough, SOB, DOE, wheezing, hemoptysis CARDIAC: no chest pain, edema or palpitations GI: no abdominal pain, diarrhea, heart burn, nausea or vomiting, +constipation GU: Denies dysuria, frequency, hematuria, incontinence, or discharge PSYCHIATRIC: Denies feeling of depression or anxiety. No report of hallucinations, insomnia, paranoia, or  agitation   PHYSICAL EXAMINATION  GENERAL APPEARANCE: Well nourished. In no acute distress. Obese SKIN:  Skin is warm and dry HEAD: Normal in size and contour. No evidence of trauma EYES: Lids open and close normally. No blepharitis, entropion or ectropion. PERRL. Conjunctivae are clear and sclerae are white. Lenses are without opacity EARS: Pinnae are normal. Patient hears normal voice tunes of the examiner MOUTH and THROAT: Lips are without lesions. Oral mucosa is moist and without lesions. Tongue is normal in shape, size, and color and without lesions NECK: supple, trachea midline, no neck masses, no thyroid tenderness, no thyromegaly LYMPHATICS: no LAN in the neck, no supraclavicular LAN RESPIRATORY: breathing is even & unlabored, BS CTAB CARDIAC: Irregularly irregular, no murmur,no extra heart sounds GI: abdomen soft, normal BS, no masses, no tenderness, no hepatomegaly, no splenomegaly EXTREMITIES: : Able to move X 4 extremities PSYCHIATRIC: Alert and oriented X 3. Affect and behavior are appropriate  LABS/RADIOLOGY: Labs reviewed: 01/21/16  Na 146  K 3.7  Glucose 81  BUN 45  Creatinine 2.22  Ca 9.9  GFR 22.49 01/16/16   Na 142  K 3.7  Glucose 82   BUN 74  Creatinine 3.09  Ca 9.3 GFR 15.36 11/01/15  WBC  6.5  hemoglobin 11.6 hematocrit 35.8 MCV 94.2 platelet 184 Basic Metabolic Panel:  Recent Labs  16/10/96 0403 06/28/15 1221  06/29/15 0334  10/08/15 01/07/16 01/16/16  NA 142 141  < > 136  < > 146 144 142  K 4.3 4.3  --  4.0  < > 3.8 3.7 4.1  CL 105 106  --  102  --   --   --   --   CO2 28 27  --  26  --   --   --   --   GLUCOSE 104* 106*  --  106*  --   --   --   --   BUN 61* 76*  < > 83*  < > 46* 72* 74*  CREATININE 3.33* 3.37*  < > 2.93*  < > 2.0* 2.5* 3.1*  CALCIUM 8.8* 9.0  --  8.6*  --   --   --   --   < > = values in this interval not displayed. Liver Function Tests:  Recent Labs  07/03/15  AST 15  ALT 4*  ALKPHOS 70   CBC:  Recent Labs  06/27/15 0403  06/28/15 1221  06/29/15 0334 07/03/15 07/13/15 08/29/15  WBC 7.1 7.2  < > 6.4 5.7 6.4 4.5  NEUTROABS  --   --   --   --  3 4 2   HGB 9.4* 10.1*  --  9.4* 9.1* 9.9* 10.6*  HCT 30.2* 31.6*  --  28.1* 28* 30* 34*  MCV 93.5 92.9  --  87.5  --   --   --   PLT 209 208  --  200 229 292 176  < > = values in this interval not displayed.    ASSESSMENT/PLAN:   Vitamin B12 deficiency - continue Vitamin B 12 1,000 mcg 1 tab PO Q Tuesdays and Fridays  Right patella tendon rupture S/P repair - follow-up with Dr. Charlann Boxer, orthopedics; fall precautions; Norco 7.5/325mg  1-2 tabs Q 4 hours PRN for pain; Tizanidine 4 mg 1 tab PO Q 6 hours PRN for muscle spasm  Hypothyroidism - continue Synthroid 112 g 1 tab by mouth daily Lab Results  Component Value Date   TSH 1.11 01/07/2016    Atrial fibrillation - rate controlled. continue coreg 12.5 mg bid, amiodarone 100 mg daily and Coumadin  CAD - stable; continue coreg, hydralazine, prn NTG, Atorvastatin 10 mg daily  Osteoporosis - Continue weekly fosamax and calcitriol ; fall precautions  Allergic rhinitis - continue cetirizine 10 mg daily   GERD - stable, continue Nexium 40 mg daily  Hypertension with renal disease - continue hydralazine 25 mg tid with coreg 12.5 mg bid  Dementia - Continue donepezil  10 mg 1 tab by mouth daily at bedtime and memantine 5 mg 1 tab by mouth twice a day  Chronic constipation - continue Senna-S 8.6-50 mg take 2 tabs BID, Linzess 145 MCG  Daily, Dulcolax 10 mg 1 suppository rectally Q D PRN and miralax 17 gm bid.  Chronic diastolic heart failure - no SOB; continue torsemide 20 mg 1 tab by mouth daily, coreg 12.5 mg 1 tab by mouth twice a day and hydralazine 25 mg 1 tab by mouth 3 times a day  Neuropathy - continue Lyrica 50 mg tid and cymbalta 60 mg daily  Hyperlipidemia - continue Atorvastatin 10 mg daily Lab Results  Component Value Date   CHOL 153 10/01/2015   HDL 36 10/01/2015   LDLCALC 67 10/01/2015   TRIG  247  (A) 10/01/2015   CHOLHDL 1.8 06/16/2010   Gout - continue Uloric 40 mg 1 tab by mouth every evening  Osteoarthritis of multiple sites - continue Biofreeze 4% gel topically to neck, lower back and leg BID; Icy Hot 5% 1/2 patch to each knee Q AM     Goals of care:  Long-term care    Kenard Gower, NP [facility] [phone]

## 2016-03-07 DIAGNOSIS — M545 Low back pain: Secondary | ICD-10-CM | POA: Diagnosis not present

## 2016-03-07 DIAGNOSIS — M25532 Pain in left wrist: Secondary | ICD-10-CM | POA: Diagnosis not present

## 2016-03-07 DIAGNOSIS — M25551 Pain in right hip: Secondary | ICD-10-CM | POA: Diagnosis not present

## 2016-03-07 DIAGNOSIS — M5441 Lumbago with sciatica, right side: Secondary | ICD-10-CM | POA: Diagnosis not present

## 2016-03-07 DIAGNOSIS — Z9181 History of falling: Secondary | ICD-10-CM | POA: Diagnosis not present

## 2016-03-14 DIAGNOSIS — N390 Urinary tract infection, site not specified: Secondary | ICD-10-CM | POA: Diagnosis not present

## 2016-03-17 ENCOUNTER — Non-Acute Institutional Stay (SKILLED_NURSING_FACILITY): Payer: Medicare Other | Admitting: Adult Health

## 2016-03-17 DIAGNOSIS — N189 Chronic kidney disease, unspecified: Secondary | ICD-10-CM | POA: Diagnosis not present

## 2016-03-17 DIAGNOSIS — I480 Paroxysmal atrial fibrillation: Secondary | ICD-10-CM

## 2016-03-17 DIAGNOSIS — D631 Anemia in chronic kidney disease: Secondary | ICD-10-CM | POA: Diagnosis not present

## 2016-03-17 DIAGNOSIS — N2581 Secondary hyperparathyroidism of renal origin: Secondary | ICD-10-CM | POA: Diagnosis not present

## 2016-03-17 DIAGNOSIS — I129 Hypertensive chronic kidney disease with stage 1 through stage 4 chronic kidney disease, or unspecified chronic kidney disease: Secondary | ICD-10-CM | POA: Diagnosis not present

## 2016-03-17 DIAGNOSIS — Z7901 Long term (current) use of anticoagulants: Secondary | ICD-10-CM | POA: Diagnosis not present

## 2016-03-17 DIAGNOSIS — N184 Chronic kidney disease, stage 4 (severe): Secondary | ICD-10-CM | POA: Diagnosis not present

## 2016-03-17 NOTE — Progress Notes (Signed)
Subjective:     Indication: atrial fibrillation Bleeding signs/symptoms: None Thromboembolic signs/symptoms: None  Missed Coumadin doses: None Medication changes: no Dietary changes: no Bacterial/viral infection: no Other concerns: no  The following portions of the patient's history were reviewed and updated as appropriate: allergies, current medications, past family history, past medical history, past social history, past surgical history and problem list.  Review of Systems A comprehensive review of systems was negative.   Objective:    INR Today: 1.8 Current dose: Coumadin 2 mg PO Q Mon and Fri; Coumadin 1.5 mg PO Q Sat and Sun    Assessment:    Subtherapeutic INR for goal of 2-3   Plan:    1. New dose: Start Coumadin 2 mg PO Q D   2. Next INR: 03/20/16

## 2016-04-02 ENCOUNTER — Other Ambulatory Visit: Payer: Self-pay | Admitting: *Deleted

## 2016-04-02 MED ORDER — HYDROCODONE-ACETAMINOPHEN 7.5-325 MG PO TABS: ORAL_TABLET | ORAL | 0 refills | Status: DC

## 2016-04-02 NOTE — Telephone Encounter (Signed)
[facility] #[phone] Fax: [phone] 

## 2016-04-04 DIAGNOSIS — Z79899 Other long term (current) drug therapy: Secondary | ICD-10-CM | POA: Diagnosis not present

## 2016-04-04 DIAGNOSIS — D509 Iron deficiency anemia, unspecified: Secondary | ICD-10-CM | POA: Diagnosis not present

## 2016-04-04 LAB — BASIC METABOLIC PANEL
BUN: 43 mg/dL — AB (ref 4–21)
Creatinine: 2.1 mg/dL — AB (ref 0.5–1.1)
GLUCOSE: 81 mg/dL
POTASSIUM: 3.9 mmol/L (ref 3.4–5.3)
SODIUM: 146 mmol/L (ref 137–147)

## 2016-04-07 ENCOUNTER — Non-Acute Institutional Stay (SKILLED_NURSING_FACILITY): Payer: Medicare Other | Admitting: Adult Health

## 2016-04-07 ENCOUNTER — Encounter: Payer: Self-pay | Admitting: Adult Health

## 2016-04-07 DIAGNOSIS — I480 Paroxysmal atrial fibrillation: Secondary | ICD-10-CM

## 2016-04-07 DIAGNOSIS — E038 Other specified hypothyroidism: Secondary | ICD-10-CM | POA: Diagnosis not present

## 2016-04-07 DIAGNOSIS — R21 Rash and other nonspecific skin eruption: Secondary | ICD-10-CM

## 2016-04-07 DIAGNOSIS — Z79899 Other long term (current) drug therapy: Secondary | ICD-10-CM | POA: Diagnosis not present

## 2016-04-07 DIAGNOSIS — E538 Deficiency of other specified B group vitamins: Secondary | ICD-10-CM

## 2016-04-07 DIAGNOSIS — D638 Anemia in other chronic diseases classified elsewhere: Secondary | ICD-10-CM

## 2016-04-07 DIAGNOSIS — M810 Age-related osteoporosis without current pathological fracture: Secondary | ICD-10-CM

## 2016-04-07 LAB — HEPATIC FUNCTION PANEL
ALT: 12 U/L (ref 7–35)
AST: 22 U/L (ref 13–35)
Alkaline Phosphatase: 62 U/L (ref 25–125)
Bilirubin, Direct: 0.08 mg/dL (ref 0.01–0.4)
Bilirubin, Total: 0.4 mg/dL

## 2016-04-07 NOTE — Progress Notes (Signed)
Patient ID: [name], female   DOB: 09/26/1932, 82 y.o.   MRN: [id number]    DATE:    04/07/16    MRN:  [id number]  BIRTHDAY: 09/26/1932  Facility:  Nursing Home Location:  [facility]  Nursing Home Room Number: 905-B  LEVEL OF CARE:  SNF [id number])  Contact Information    Name Relation Home Work [employer]   Gregory,David Son [phone]  [phone]   Gray,Sharon Daughter [phone]  [phone]       Code Status History    Date Active Date Inactive Code Status Order ID Comments User Context   03/08/2015  8:25 PM 03/12/2015  5:09 PM Full Code [id number]  Lanney GinsMatthew Babish, PA-C Inpatient   12/15/2014 12:39 AM 12/15/2014  6:36 PM Full Code [id number]  Yevonne PaxSaadat A Khan, MD Inpatient   12/04/2014  4:00 PM 12/07/2014  5:13 PM Full Code [id number]  Lanney GinsMatthew Babish, PA-C Inpatient   12/21/2012  3:56 PM 12/28/2012  7:34 PM DNR [id number]  Tonny BollmanMichael Cooper, MD Inpatient      Chief Complaint  Patient presents with  . Medical management of chronic illnesses    HISTORY OF PRESENT ILLNESS:  This is an 82 year old female who is being seen for a routine visit. She is here for long-term care. Torsemide was increased to 40 mg by mouth daily by nephrology for lower extremity edema. She complains of rashes on her bilateral shins that itches.  PAST MEDICAL HISTORY:  Past Medical History:  Diagnosis Date  . Acute on chronic renal failure [facility](HCC)    sees Dr Allena KatzPatel   . Anemia    Acute blood loss  . Aortic regurgitation   . Aortic stenosis 10/13/2012   Low EF, low gradient with severe aortic stenosis confirmed by dobutamine stress echocardiogram s/p TAVR 12/2012  . Asthma   . Atrial fibrillation (HCC)    tachy-brady syndrome with <1% recurrent PAF since pacemaker placement  . Cataracts, bilateral   . Chronic combined systolic and diastolic CHF (congestive heart failure) (HCC)   . Chronic lower back pain   . CKD (chronic kidney disease)   . Coronary artery disease involving native coronary  artery of native heart   . Dementia    Without behavioral disturbance  . Dysrhythmia   . Fibromyalgia   . Gastroesophageal reflux disease   . H/O dizziness   . H/O urinary frequency   . H/O: stroke   . Hard of hearing   . Headache    hx of migraines   . Heart murmur   . History of blood transfusion   . History of bronchitis   . History of kidney stones   . History of urinary tract infection   . HLD (hyperlipidemia)   . HTN (hypertension)   . Hypothyroidism   . Neuropathy (HCC)   . Nonischemic cardiomyopathy (HCC)   . On home oxygen therapy    patient uses at nite- 2L- has not used in > 6 months per patient  . Osteoarthritis   . Osteoporosis   . Pneumonia    hx of x 3   . PONV (postoperative nausea and vomiting)   . Presence of permanent cardiac pacemaker   . Pulmonary embolism (HCC)    HISTORY OF, the pt. had a recurrent bilateral pulmonary emboli in 2005, on warfarin therapy and at which time she under went implantation of IVC filter  . Repeated falls   . Rupture of right patellar tendon   . Sciatica   .  Shortness of breath dyspnea    with exertion   . Sleep apnea    uses oxygen at night and PRN- not used since > 6 months / DOES NOT USE  C-PAP  . Spinal stenosis   . Stroke (HCC)   . Symptomatic bradycardia   . Symptomatic bradycardia 2012   s/p Medtronic PPM  . Syncope   . Urinary incontinence   . Urinary urgency      CURRENT MEDICATIONS: Reviewed  Patient's Medications  New Prescriptions   No medications on file  Previous Medications   ALENDRONATE (FOSAMAX) 70 MG/75ML SOLUTION    Take 70 mg by mouth every 7 (seven) days. Take with a full glass of water on an empty stomach on Saturday   AMIODARONE (PACERONE) 100 MG TABLET    Take 100 mg by mouth daily.   ATORVASTATIN (LIPITOR) 10 MG TABLET    Take 10 mg by mouth daily.   BIOTIN 5000 PO    Take 5,000 mcg by mouth every evening.    BISACODYL (DULCOLAX) 10 MG SUPPOSITORY    Place 10 mg rectally daily as needed  for moderate constipation.   CALCITRIOL (ROCALTROL) 0.25 MCG CAPSULE    Take 0.25 mcg by mouth every morning.    CARVEDILOL (COREG) 12.5 MG TABLET    Take 1 tablet by mouth 2 (two) times daily.    CETIRIZINE (ZYRTEC) 10 MG TABLET    Take 10 mg by mouth daily.    CHOLECALCIFEROL (VITAMIN D-3) 1000 UNITS CAPS    Take 2 capsules by mouth. Take 2 capsules to = 2000 units PO QHS   CYCLOSPORINE (RESTASIS) 0.05 % OPHTHALMIC EMULSION    Place 1 drop into both eyes 2 (two) times daily as needed (dry eyes.).   DONEPEZIL (ARICEPT) 10 MG TABLET    Take 10 mg by mouth at bedtime.    DULOXETINE (CYMBALTA) 60 MG CAPSULE    Take 60 mg by mouth every evening.    ESOMEPRAZOLE (NEXIUM) 40 MG CAPSULE    Take 40 mg by mouth daily.    FERROUS SULFATE 325 (65 FE) MG TABLET    Take 325 mg by mouth daily.    HYDRALAZINE (APRESOLINE) 25 MG TABLET    Take 25 mg by mouth 3 (three) times daily.   HYDROCODONE-ACETAMINOPHEN (NORCO) 7.5-325 MG TABLET    Take 1-2 tablets by mouth every 4 (four) hours as needed for moderate pain.   IPRATROPIUM-ALBUTEROL (DUONEB) 0.5-2.5 (3) MG/3ML SOLN    Take 3 mLs by nebulization every 8 (eight) hours as needed.    LEVOTHYROXINE (SYNTHROID, LEVOTHROID) 112 MCG TABLET    Take 112 mcg by mouth daily.    LINACLOTIDE (LINZESS) 145 MCG CAPS CAPSULE    Take 145 mcg by mouth daily.    MEMANTINE (NAMENDA) 5 MG TABLET    Take 5 mg by mouth 2 (two) times daily.    MENTHOL (ICY HOT ADVANCED RELIEF) 7.5 % PTCH    Apply 3 patches topically daily. Apply 1 patch to each knee and one patch across lower back.  Apply at 10AM and remove at 10PM   MENTHOL, TOPICAL ANALGESIC, (BIOFREEZE) 4 % GEL    Apply 1 application topically 2 (two) times daily. Apply to back of neck, lower back, and right leg   MULTIPLE VITAMIN (MULTIVITAMIN) TABLET    Take 1 tablet by mouth daily.    NITROGLYCERIN (NITROSTAT) 0.4 MG SL TABLET    Place 0.4 mg under the tongue every 5 (five)  minutes as needed for chest pain (MAX 3 TABLETS).     POLYETHYLENE GLYCOL (MIRALAX / GLYCOLAX) PACKET    Take 17 g by mouth 2 (two) times daily.   PREGABALIN (LYRICA) 50 MG CAPSULE    Take 50 mg by mouth 3 (three) times daily. 6AM, 2PM, 10PM   SENNA (SENOKOT) 8.6 MG TABS TABLET    Take 2 tablets by mouth 2 (two) times daily.   SIMETHICONE (MYLICON) 80 MG CHEWABLE TABLET    Chew 80 mg by mouth every 12 (twelve) hours as needed for flatulence.    TIZANIDINE (ZANAFLEX) 4 MG CAPSULE    Take 4 mg by mouth every 12 (twelve) hours as needed for muscle spasms. Take for 2 weeks as needed, stopping on 12/20.  Take PRN x1 week, ending on 04/17/16, and then discontinue.   TORSEMIDE (DEMADEX) 20 MG TABLET    Take 40 mg by mouth daily. Take 2 tablets to = 40 mg qd   TURMERIC 500 MG CAPS    Take 1 capsule by mouth 2 (two) times daily.   ULORIC 40 MG TABLET    Take 40 mg by mouth every evening.    VITAMIN B-12 (CYANOCOBALAMIN) 1000 MCG TABLET    Take 1,000 mcg by mouth 2 (two) times a week. Tuesday and Friday   WARFARIN (COUMADIN) 3 MG TABLET    Take 1.5 mg by mouth daily. Take 1/2 of a 3 mg tablet to = 1.5 mg qd  Modified Medications   No medications on file  Discontinued Medications   HYDROCODONE-ACETAMINOPHEN (NORCO) 7.5-325 MG TABLET    Take one tablet by mouth every 4 hours as needed for moderate pain. Hold for sedation. DNE 3gm of Tylenol in 24 hours   MENTHOL (ICY HOT) 5 % PTCH    Apply 1 each topically. Apply 1/2 patch to each knee QAM and remove at HS.  Monitor skin for rash or irritation.   TIZANIDINE (ZANAFLEX) 4 MG TABLET    Take 1 tablet (4 mg total) by mouth every 6 (six) hours as needed for muscle spasms.   WARFARIN (COUMADIN) 2 MG TABLET    Take 2 mg by mouth. Take Tue-Wed-Thu-Sat-Sun   WARFARIN (COUMADIN) 2.5 MG TABLET    Take 2.5 mg by mouth. Take on Monday and Friday     Allergies  Allergen Reactions  . Nubain [Nalbuphine Hcl] Hives    Went into cardiac arrest   . Codeine Hives and Nausea Only  . Darvocet [Propoxyphene N-Acetaminophen]  Nausea Only     REVIEW OF SYSTEMS:  GENERAL: no change in appetite, no fatigue, no weight changes, no fever, chills or weakness EYES: Denies change in vision, dry eyes, eye pain, itching or discharge EARS: Denies change in hearing, ringing in ears, or earache NOSE: Denies nasal congestion or epistaxis MOUTH and THROAT: Denies oral discomfort, gingival pain or bleeding, pain from teeth or hoarseness   RESPIRATORY: no cough, SOB, DOE, wheezing, hemoptysis CARDIAC: no chest pain, edema or palpitations GI: no abdominal pain, diarrhea, heart burn, nausea or vomiting, +constipation GU: Denies dysuria, frequency, hematuria, incontinence, or discharge PSYCHIATRIC: Denies feeling of depression or anxiety. No report of hallucinations, insomnia, paranoia, or agitation   PHYSICAL EXAMINATION  GENERAL APPEARANCE: Well nourished. In no acute distress. Obese SKIN:  Erythematous rashes on bilateral shins HEAD: Normal in size and contour. No evidence of trauma EYES: Lids open and close normally. No blepharitis, entropion or ectropion. PERRL. Conjunctivae are clear and sclerae are white. Lenses are  without opacity EARS: Pinnae are normal. Patient hears normal voice tunes of the examiner MOUTH and THROAT: Lips are without lesions. Oral mucosa is moist and without lesions. Tongue is normal in shape, size, and color and without lesions NECK: supple, trachea midline, no neck masses, no thyroid tenderness, no thyromegaly LYMPHATICS: no LAN in the neck, no supraclavicular LAN RESPIRATORY: breathing is even & unlabored, BS CTAB CARDIAC: Irregularly irregular, no murmur,no extra heart sounds GI: abdomen soft, normal BS, no masses, no tenderness, no hepatomegaly, no splenomegaly EXTREMITIES: : Able to move X 4 extremities PSYCHIATRIC: Alert and oriented X 3. Affect and behavior are appropriate  LABS/RADIOLOGY: Labs reviewed: 04/07/2016  total protein 6.2 albumin 3.66 total bilirubin 0.44 direct bilirubin  0.08 alkaline phosphatase 62 SGOT 22 SGPT 12 01/21/16  Na 146  K 3.7  Glucose 81  BUN 45  Creatinine 2.22  Ca 9.9  GFR 22.49 01/16/16   Na 142  K 3.7  Glucose 82   BUN 74  Creatinine 3.09  Ca 9.3 GFR 15.36 11/01/15  WBC 6.5  hemoglobin 11.6 hematocrit 35.8 MCV 94.2 platelet 184 Basic Metabolic Panel:  Recent Labs  16/10/96 0403 06/28/15 1221  06/29/15 0334  01/07/16 01/16/16 04/04/16  NA 142 141  < > 136  < > 144 142 146  K 4.3 4.3  --  4.0  < > 3.7 4.1 3.9  CL 105 106  --  102  --   --   --   --   CO2 28 27  --  26  --   --   --   --   GLUCOSE 104* 106*  --  106*  --   --   --   --   BUN 61* 76*  < > 83*  < > 72* 74* 43*  CREATININE 3.33* 3.37*  < > 2.93*  < > 2.5* 3.1* 2.1*  CALCIUM 8.8* 9.0  --  8.6*  --   --   --   --   < > = values in this interval not displayed. Liver Function Tests:  Recent Labs  07/03/15  AST 15  ALT 4*  ALKPHOS 70   CBC:  Recent Labs  06/27/15 0403 06/28/15 1221  06/29/15 0334 07/03/15 07/13/15 08/29/15  WBC 7.1 7.2  < > 6.4 5.7 6.4 4.5  NEUTROABS  --   --   --   --  3 4 2   HGB 9.4* 10.1*  --  9.4* 9.1* 9.9* 10.6*  HCT 30.2* 31.6*  --  28.1* 28* 30* 34*  MCV 93.5 92.9  --  87.5  --   --   --   PLT 209 208  --  200 229 292 176  < > = values in this interval not displayed.    ASSESSMENT/PLAN:   Anemia of chronic disease - continue ferrous sulfate 325 mg 1 tab by mouth daily; check CBC on 04/08/16 Lab Results  Component Value Date   HGB 10.6 (A) 08/29/2015   Rashes on lower extremity - start hydrocortisone 1% cream to rashes on bilateral shins twice a day 1 week  Vitamin B12 deficiency - continue Vitamin B 12 1,000 mcg 1 tab PO Q Tuesdays and Fridays  Hypothyroidism - continue Synthroid 112 g 1 tab by mouth daily Lab Results  Component Value Date   TSH 1.11 01/07/2016    Atrial fibrillation - rate controlled. continue coreg 12.5 mg bid, amiodarone 100 mg daily and Coumadin  Osteoporosis - Continue weekly  fosamax and calcitriol ;  fall precautions     Goals of care:  Long-term care    Kenard Gower, NP [facility] [phone]

## 2016-04-08 DIAGNOSIS — D509 Iron deficiency anemia, unspecified: Secondary | ICD-10-CM | POA: Diagnosis not present

## 2016-04-08 LAB — CBC AND DIFFERENTIAL
HCT: 36 % (ref 36–46)
HEMOGLOBIN: 11.8 g/dL — AB (ref 12.0–16.0)
Neutrophils Absolute: 4 /uL
Platelets: 194 10*3/uL (ref 150–399)
WBC: 6.3 10*3/mL

## 2016-04-15 DIAGNOSIS — R05 Cough: Secondary | ICD-10-CM | POA: Diagnosis not present

## 2016-04-15 DIAGNOSIS — R062 Wheezing: Secondary | ICD-10-CM | POA: Diagnosis not present

## 2016-04-18 DIAGNOSIS — M6281 Muscle weakness (generalized): Secondary | ICD-10-CM | POA: Diagnosis not present

## 2016-04-19 DIAGNOSIS — R0989 Other specified symptoms and signs involving the circulatory and respiratory systems: Secondary | ICD-10-CM | POA: Diagnosis not present

## 2016-04-19 DIAGNOSIS — R05 Cough: Secondary | ICD-10-CM | POA: Diagnosis not present

## 2016-04-21 DIAGNOSIS — M6281 Muscle weakness (generalized): Secondary | ICD-10-CM | POA: Diagnosis not present

## 2016-04-21 DIAGNOSIS — Z9181 History of falling: Secondary | ICD-10-CM | POA: Diagnosis not present

## 2016-04-24 DIAGNOSIS — Z9181 History of falling: Secondary | ICD-10-CM | POA: Diagnosis not present

## 2016-04-24 DIAGNOSIS — M6281 Muscle weakness (generalized): Secondary | ICD-10-CM | POA: Diagnosis not present

## 2016-04-28 DIAGNOSIS — M6281 Muscle weakness (generalized): Secondary | ICD-10-CM | POA: Diagnosis not present

## 2016-04-28 DIAGNOSIS — Z9181 History of falling: Secondary | ICD-10-CM | POA: Diagnosis not present

## 2016-04-29 DIAGNOSIS — Z9181 History of falling: Secondary | ICD-10-CM | POA: Diagnosis not present

## 2016-04-29 DIAGNOSIS — M6281 Muscle weakness (generalized): Secondary | ICD-10-CM | POA: Diagnosis not present

## 2016-04-30 DIAGNOSIS — M6281 Muscle weakness (generalized): Secondary | ICD-10-CM | POA: Diagnosis not present

## 2016-04-30 DIAGNOSIS — Z9181 History of falling: Secondary | ICD-10-CM | POA: Diagnosis not present

## 2016-05-02 DIAGNOSIS — M6281 Muscle weakness (generalized): Secondary | ICD-10-CM | POA: Diagnosis not present

## 2016-05-02 DIAGNOSIS — Z9181 History of falling: Secondary | ICD-10-CM | POA: Diagnosis not present

## 2016-05-05 ENCOUNTER — Encounter: Payer: Self-pay | Admitting: Adult Health

## 2016-05-05 ENCOUNTER — Non-Acute Institutional Stay (SKILLED_NURSING_FACILITY): Payer: Medicare Other | Admitting: Adult Health

## 2016-05-05 DIAGNOSIS — Z7901 Long term (current) use of anticoagulants: Secondary | ICD-10-CM

## 2016-05-05 DIAGNOSIS — I480 Paroxysmal atrial fibrillation: Secondary | ICD-10-CM

## 2016-05-05 NOTE — Progress Notes (Signed)
Subjective:     Indication: atrial fibrillation Bleeding signs/symptoms: None Thromboembolic signs/symptoms: None  Missed Coumadin doses: None Medication changes: no Dietary changes: no Bacterial/viral infection: no Other concerns: no  The following portions of the patient's history were reviewed and updated as appropriate: allergies, current medications, past family history, past medical history, past social history, past surgical history and problem list.  Review of Systems A comprehensive review of systems was negative.   Objective:    INR Today: 1.5 Current dose: Coumadin 5 mg daily    Assessment:    Subtherapeutic INR for goal of 2-3   Plan:    1. New dose: Coumadin 5 mg PO X 1 today then Coumadin 2 mg PO Q D   2. Next INR: 05/08/16

## 2016-05-12 DIAGNOSIS — M79661 Pain in right lower leg: Secondary | ICD-10-CM | POA: Diagnosis not present

## 2016-05-12 DIAGNOSIS — M25561 Pain in right knee: Secondary | ICD-10-CM | POA: Diagnosis not present

## 2016-05-12 DIAGNOSIS — M79651 Pain in right thigh: Secondary | ICD-10-CM | POA: Diagnosis not present

## 2016-05-21 ENCOUNTER — Encounter: Payer: Self-pay | Admitting: Adult Health

## 2016-05-21 ENCOUNTER — Non-Acute Institutional Stay (SKILLED_NURSING_FACILITY): Payer: Medicare Other | Admitting: Adult Health

## 2016-05-21 DIAGNOSIS — E038 Other specified hypothyroidism: Secondary | ICD-10-CM

## 2016-05-21 DIAGNOSIS — I25119 Atherosclerotic heart disease of native coronary artery with unspecified angina pectoris: Secondary | ICD-10-CM

## 2016-05-21 DIAGNOSIS — K219 Gastro-esophageal reflux disease without esophagitis: Secondary | ICD-10-CM

## 2016-05-21 DIAGNOSIS — I10 Essential (primary) hypertension: Secondary | ICD-10-CM

## 2016-05-21 DIAGNOSIS — K5909 Other constipation: Secondary | ICD-10-CM

## 2016-05-21 NOTE — Progress Notes (Signed)
DATE:  05/21/2016   MRN:  [id number]  BIRTHDAY: 02-04-1933  Facility:  Nursing Home Location:  [facility]  Nursing Home Room Number: 905-B  LEVEL OF CARE:  SNF [phone])  Contact Information    Name Relation Home Work [place] [phone]  [phone]   Gray,Sharon Daughter [phone]  [phone]       Code Status History    Date Active Date Inactive Code Status Order ID Comments User Context   03/08/2015  8:25 PM 03/12/2015  5:09 PM Full Code [id number]  Lanney Gins, PA-C Inpatient   12/15/2014 12:39 AM 12/15/2014  6:36 PM Full Code [id number]  Yevonne Pax, MD Inpatient   12/04/2014  4:00 PM 12/07/2014  5:13 PM Full Code [id number]  Lanney Gins, PA-C Inpatient   12/21/2012  3:56 PM 12/28/2012  7:34 PM DNR [id number]  Tonny Bollman, MD Inpatient       Chief Complaint  Patient presents with  . Medical Management of Chronic Issues    HISTORY OF PRESENT ILLNESS:  This is an 84-YO female seen for a routine visit.  She is a long-term care resident at [facility]. OT was recently discontinued. She was recently treated for HCAP with Levaquin. She was seen in her roomw and did not verbalized any concerns today.     PAST MEDICAL HISTORY:  Past Medical History:  Diagnosis Date  . Acute on chronic renal failure [facility])    sees Dr Allena Katz   . Anemia    Acute blood loss  . Aortic regurgitation   . Aortic stenosis 10/13/2012   Low EF, low gradient with severe aortic stenosis confirmed by dobutamine stress echocardiogram s/p TAVR 12/2012  . Asthma   . Atrial fibrillation (HCC)    tachy-brady syndrome with <1% recurrent PAF since pacemaker placement  . Cataracts, bilateral   . Chronic combined systolic and diastolic CHF (congestive heart failure) (HCC)   . Chronic lower back pain   . CKD (chronic kidney disease)   . Coronary artery disease involving native coronary artery of native heart   . Dementia    Without behavioral  disturbance  . Dysrhythmia   . Fibromyalgia   . Gastroesophageal reflux disease   . H/O dizziness   . H/O urinary frequency   . H/O: stroke   . Hard of hearing   . Headache    hx of migraines   . Heart murmur   . History of blood transfusion   . History of bronchitis   . History of kidney stones   . History of urinary tract infection   . HLD (hyperlipidemia)   . HTN (hypertension)   . Hypothyroidism   . Neuropathy (HCC)   . Nonischemic cardiomyopathy (HCC)   . On home oxygen therapy    patient uses at nite- 2L- has not used in > 6 months per patient  . Osteoarthritis   . Osteoporosis   . Pneumonia    hx of x 3   . PONV (postoperative nausea and vomiting)   . Presence of permanent cardiac pacemaker   . Pulmonary embolism (HCC)    HISTORY OF, the pt. had a recurrent bilateral pulmonary emboli in 2005, on warfarin therapy and at which time she under went implantation of IVC filter  . Repeated falls   . Rupture of right patellar tendon   . Sciatica   . Shortness of breath dyspnea    with exertion   .  Sleep apnea    uses oxygen at night and PRN- not used since > 6 months / DOES NOT USE  C-PAP  . Spinal stenosis   . Stroke (HCC)   . Symptomatic bradycardia   . Symptomatic bradycardia 2012   s/p Med[employer] PPM  . Syncope   . Urinary incontinence   . Urinary urgency      CURRENT MEDICATIONS: Reviewed  Patient's Medications  New Prescriptions   No medications on file  Previous Medications   ALENDRONATE (FOSAMAX) 70 MG/75ML SOLUTION    Take 70 mg by mouth every 7 (seven) days. Take with a full glass of water on an empty stomach on Saturday   AMIODARONE (PACERONE) 100 MG TABLET    Take 100 mg by mouth daily.   ATORVASTATIN (LIPITOR) 10 MG TABLET    Take 10 mg by mouth daily at 6 PM.    BIOTIN 5000 PO    Take 5,000 mcg by mouth every evening.    BISACODYL (DULCOLAX) 10 MG SUPPOSITORY    Place 10 mg rectally daily as needed for moderate constipation.   CALCITRIOL  (ROCALTROL) 0.25 MCG CAPSULE    Take 0.25 mcg by mouth every morning.    CARVEDILOL (COREG) 12.5 MG TABLET    Take 1 tablet by mouth 2 (two) times daily.    CETIRIZINE (ZYRTEC) 10 MG TABLET    Take 10 mg by mouth daily.    CHOLECALCIFEROL (VITAMIN D-3) 1000 UNITS CAPS    Take 2 capsules by mouth. Take 2 capsules to = 2000 units PO QHS   CYCLOSPORINE (RESTASIS) 0.05 % OPHTHALMIC EMULSION    Place 1 drop into both eyes 2 (two) times daily as needed (dry eyes.).   DONEPEZIL (ARICEPT) 10 MG TABLET    Take 10 mg by mouth at bedtime.    DULOXETINE (CYMBALTA) 60 MG CAPSULE    Take 60 mg by mouth every evening.    ESOMEPRAZOLE (NEXIUM) 40 MG CAPSULE    Take 40 mg by mouth daily.    FERROUS SULFATE 325 (65 FE) MG TABLET    Take 325 mg by mouth daily.    HYDRALAZINE (APRESOLINE) 25 MG TABLET    Take 25 mg by mouth 3 (three) times daily.   HYDROCODONE-ACETAMINOPHEN (NORCO) 7.5-325 MG TABLET    Take 1-2 tablets by mouth every 4 (four) hours as needed for moderate pain.   IPRATROPIUM-ALBUTEROL (DUONEB) 0.5-2.5 (3) MG/3ML SOLN    Take 3 mLs by nebulization every 6 (six) hours as needed.    LEVOTHYROXINE (SYNTHROID, LEVOTHROID) 112 MCG TABLET    Take 112 mcg by mouth daily.    LINACLOTIDE (LINZESS) 145 MCG CAPS CAPSULE    Take 145 mcg by mouth daily.    MEMANTINE (NAMENDA) 5 MG TABLET    Take 5 mg by mouth 2 (two) times daily.    MENTHOL (ICY HOT ADVANCED RELIEF) 7.5 % PTCH    Apply 3 patches topically daily. Apply 1 patch to each knee and one patch across lower back.  Apply at 10AM and remove at 10PM   MENTHOL, TOPICAL ANALGESIC, (BIOFREEZE) 4 % GEL    Apply 1 application topically 2 (two) times daily. Apply to back of neck, lower back, and right leg   MULTIPLE VITAMIN (MULTIVITAMIN) TABLET    Take 1 tablet by mouth daily.    NITROGLYCERIN (NITROSTAT) 0.4 MG SL TABLET    Place 0.4 mg under the tongue every 5 (five) minutes as needed for chest pain (MAX 3  TABLETS).    POLYETHYLENE GLYCOL (MIRALAX / GLYCOLAX)  PACKET    Take 17 g by mouth 2 (two) times daily.   PRAMOXINE (SARNA SENSITIVE) 1 % LOTN    Apply 1 application topically 2 (two) times daily. Apply to BLE QAM and QPM   PREGABALIN (LYRICA) 50 MG CAPSULE    Take 50 mg by mouth 3 (three) times daily. 6AM, 2PM, 10PM   SENNA (SENOKOT) 8.6 MG TABS TABLET    Take 2 tablets by mouth 2 (two) times daily.   SIMETHICONE (MYLICON) 80 MG CHEWABLE TABLET    Chew 80 mg by mouth every 12 (twelve) hours as needed for flatulence.    TORSEMIDE (DEMADEX) 20 MG TABLET    Take 40 mg by mouth daily. Take 2 tablets to = 40 mg qd   TURMERIC 500 MG CAPS    Take 1 capsule by mouth 2 (two) times daily.   ULORIC 40 MG TABLET    Take 40 mg by mouth every evening.    VITAMIN B-12 (CYANOCOBALAMIN) 1000 MCG TABLET    Take 1,000 mcg by mouth 2 (two) times a week. Tuesday and Friday   WARFARIN (COUMADIN) 3 MG TABLET    Take 1.5 mg by mouth daily. Take 1/2 of a 3 mg tablet to = 1.5 mg qd  Modified Medications   No medications on file  Discontinued Medications   TIZANIDINE (ZANAFLEX) 4 MG CAPSULE    Take 4 mg by mouth every 12 (twelve) hours as needed for muscle spasms. Take for 2 weeks as needed, stopping on 12/20.  Take PRN x1 week, ending on 04/17/16, and then discontinue.     Allergies  Allergen Reactions  . Nubain [Nalbuphine Hcl] Hives    Went into cardiac arrest   . Codeine Hives and Nausea Only  . Darvocet [Propoxyphene N-Acetaminophen] Nausea Only     REVIEW OF SYSTEMS:  GENERAL: no change in appetite, no fatigue, no weight changes, no fever, chills or weakness EYES: Denies change in vision, dry eyes, eye pain, itching or discharge EARS: Denies change in hearing, ringing in ears, or earache NOSE: Denies nasal congestion or epistaxis MOUTH and THROAT: Denies oral discomfort, gingival pain or bleeding, pain from teeth or hoarseness   RESPIRATORY: no cough, SOB, DOE, wheezing, hemoptysis CARDIAC: no chest pain, edema or palpitations GI: no abdominal pain,  diarrhea, constipation, heart burn, nausea or vomiting GU: Denies dysuria, frequency, hematuria, incontinence, or discharge PSYCHIATRIC: Denies feeling of depression or anxiety. No report of hallucinations, insomnia, paranoia, or agitation    PHYSICAL EXAMINATION  GENERAL APPEARANCE: Well nourished. In no acute distress. Morbidly obese SKIN:  Skin is warm and dry.  HEAD: Normal in size and contour. No evidence of trauma EYES: Lids open and close normally. No blepharitis, entropion or ectropion. PERRL. Conjunctivae are clear and sclerae are white. Lenses are without opacity EARS: Pinnae are normal. Patient hears normal voice tunes of the examiner MOUTH and THROAT: Lips are without lesions. Oral mucosa is moist and without lesions. Tongue is normal in shape, size, and color and without lesions NECK: supple, trachea midline, no neck masses, no thyroid tenderness, no thyromegaly LYMPHATICS: no LAN in the neck, no supraclavicular LAN RESPIRATORY: breathing is even & unlabored, BS CTAB CARDIAC: RRR, no murmur,no extra heart sounds, no edema GI: abdomen soft, normal BS, no masses, no tenderness, no hepatomegaly, no splenomegaly EXTREMITIES:  Able to move X 4 extremities PSYCHIATRIC: Alert and oriented X 3. Affect and behavior are appropriate  LABS/RADIOLOGY: Labs reviewed: Basic Metabolic Panel:  Recent Labs  65/78/46 0403 06/28/15 1221  06/29/15 0334  01/07/16 01/16/16 04/04/16  NA 142 141  < > 136  < > 144 142 146  K 4.3 4.3  --  4.0  < > 3.7 4.1 3.9  CL 105 106  --  102  --   --   --   --   CO2 28 27  --  26  --   --   --   --   GLUCOSE 104* 106*  --  106*  --   --   --   --   BUN 61* 76*  < > 83*  < > 72* 74* 43*  CREATININE 3.33* 3.37*  < > 2.93*  < > 2.5* 3.1* 2.1*  CALCIUM 8.8* 9.0  --  8.6*  --   --   --   --   < > = values in this interval not displayed. Liver Function Tests:  Recent Labs  07/03/15 04/07/16  AST 15 22  ALT 4* 12  ALKPHOS 70 62    CBC:  Recent  Labs  06/27/15 0403 06/28/15 1221  06/29/15 0334  07/13/15 08/29/15 04/08/16  WBC 7.1 7.2  < > 6.4  < > 6.4 4.5 6.3  NEUTROABS  --   --   --   --   < > 4 2 4   HGB 9.4* 10.1*  --  9.4*  < > 9.9* 10.6* 11.8*  HCT 30.2* 31.6*  --  28.1*  < > 30* 34* 36  MCV 93.5 92.9  --  87.5  --   --   --   --   PLT 209 208  --  200  < > 292 176 194  < > = values in this interval not displayed. Lipid Panel:  Recent Labs  10/01/15  HDL 36    ASSESSMENT/PLAN:  Hypothyroidism - continue Synthroid 112 g 1 tab by mouth daily Lab Results  Component Value Date   TSH 1.11 01/07/2016   CAD - continue NTG when necessary, Coreg, hydralazine and atorvastatin  GERD - stable; continue Nexium 40 mg 1 capsule by mouth daily  Chronic constipation - stable; continue Dulcolax 10 mg suppository daily when necessary, MiraLAX 17 g by mouth twice a day, Linzess 145 g 1 capsule by mouth daily and senna 8.6 mg 2 tabs by mouth twice a day  Hypertension - well-controlled; continue Coreg 12.5 mg 1 tab by mouth twice a day and hydralazine 25 mg 1 tab by mouth 3 times a day     Goals of care:  Long-term care    Jammie Clink C. Medina-Vargas - NP [employer] [phone]

## 2016-06-10 DIAGNOSIS — D631 Anemia in chronic kidney disease: Secondary | ICD-10-CM | POA: Diagnosis not present

## 2016-06-10 DIAGNOSIS — I129 Hypertensive chronic kidney disease with stage 1 through stage 4 chronic kidney disease, or unspecified chronic kidney disease: Secondary | ICD-10-CM | POA: Diagnosis not present

## 2016-06-10 DIAGNOSIS — N189 Chronic kidney disease, unspecified: Secondary | ICD-10-CM | POA: Diagnosis not present

## 2016-06-10 DIAGNOSIS — N184 Chronic kidney disease, stage 4 (severe): Secondary | ICD-10-CM | POA: Diagnosis not present

## 2016-06-10 DIAGNOSIS — N2581 Secondary hyperparathyroidism of renal origin: Secondary | ICD-10-CM | POA: Diagnosis not present

## 2016-06-13 ENCOUNTER — Emergency Department (HOSPITAL_COMMUNITY): Payer: Medicare Other

## 2016-06-13 ENCOUNTER — Inpatient Hospital Stay (HOSPITAL_COMMUNITY)
Admission: EM | Admit: 2016-06-13 | Discharge: 2016-06-27 | DRG: 291 | Disposition: A | Discharge status: Skilled Nursing Facility | Payer: Medicare Other | Attending: Internal Medicine | Admitting: Internal Medicine

## 2016-06-13 ENCOUNTER — Encounter (HOSPITAL_COMMUNITY): Payer: Self-pay | Admitting: Emergency Medicine

## 2016-06-13 ENCOUNTER — Inpatient Hospital Stay (HOSPITAL_COMMUNITY): Payer: Medicare Other

## 2016-06-13 DIAGNOSIS — G8929 Other chronic pain: Secondary | ICD-10-CM | POA: Diagnosis not present

## 2016-06-13 DIAGNOSIS — Z885 Allergy status to narcotic agent status: Secondary | ICD-10-CM

## 2016-06-13 DIAGNOSIS — I200 Unstable angina: Secondary | ICD-10-CM | POA: Diagnosis not present

## 2016-06-13 DIAGNOSIS — I429 Cardiomyopathy, unspecified: Secondary | ICD-10-CM | POA: Diagnosis present

## 2016-06-13 DIAGNOSIS — R739 Hyperglycemia, unspecified: Secondary | ICD-10-CM | POA: Diagnosis not present

## 2016-06-13 DIAGNOSIS — G629 Polyneuropathy, unspecified: Secondary | ICD-10-CM | POA: Diagnosis present

## 2016-06-13 DIAGNOSIS — Z87440 Personal history of urinary (tract) infections: Secondary | ICD-10-CM

## 2016-06-13 DIAGNOSIS — E039 Hypothyroidism, unspecified: Secondary | ICD-10-CM | POA: Diagnosis present

## 2016-06-13 DIAGNOSIS — E1122 Type 2 diabetes mellitus with diabetic chronic kidney disease: Secondary | ICD-10-CM | POA: Diagnosis not present

## 2016-06-13 DIAGNOSIS — I130 Hypertensive heart and chronic kidney disease with heart failure and stage 1 through stage 4 chronic kidney disease, or unspecified chronic kidney disease: Secondary | ICD-10-CM | POA: Diagnosis not present

## 2016-06-13 DIAGNOSIS — R030 Elevated blood-pressure reading, without diagnosis of hypertension: Secondary | ICD-10-CM | POA: Diagnosis not present

## 2016-06-13 DIAGNOSIS — G4730 Sleep apnea, unspecified: Secondary | ICD-10-CM | POA: Diagnosis not present

## 2016-06-13 DIAGNOSIS — I352 Nonrheumatic aortic (valve) stenosis with insufficiency: Secondary | ICD-10-CM | POA: Diagnosis present

## 2016-06-13 DIAGNOSIS — J9600 Acute respiratory failure, unspecified whether with hypoxia or hypercapnia: Secondary | ICD-10-CM | POA: Diagnosis present

## 2016-06-13 DIAGNOSIS — R296 Repeated falls: Secondary | ICD-10-CM | POA: Diagnosis present

## 2016-06-13 DIAGNOSIS — Z96651 Presence of right artificial knee joint: Secondary | ICD-10-CM | POA: Diagnosis present

## 2016-06-13 DIAGNOSIS — Z8673 Personal history of transient ischemic attack (TIA), and cerebral infarction without residual deficits: Secondary | ICD-10-CM

## 2016-06-13 DIAGNOSIS — F329 Major depressive disorder, single episode, unspecified: Secondary | ICD-10-CM | POA: Diagnosis present

## 2016-06-13 DIAGNOSIS — R9439 Abnormal result of other cardiovascular function study: Secondary | ICD-10-CM | POA: Diagnosis not present

## 2016-06-13 DIAGNOSIS — I480 Paroxysmal atrial fibrillation: Secondary | ICD-10-CM | POA: Diagnosis present

## 2016-06-13 DIAGNOSIS — Z87442 Personal history of urinary calculi: Secondary | ICD-10-CM

## 2016-06-13 DIAGNOSIS — E631 Imbalance of constituents of food intake: Secondary | ICD-10-CM | POA: Diagnosis not present

## 2016-06-13 DIAGNOSIS — I509 Heart failure, unspecified: Secondary | ICD-10-CM

## 2016-06-13 DIAGNOSIS — J9601 Acute respiratory failure with hypoxia: Secondary | ICD-10-CM | POA: Diagnosis present

## 2016-06-13 DIAGNOSIS — I519 Heart disease, unspecified: Secondary | ICD-10-CM | POA: Diagnosis not present

## 2016-06-13 DIAGNOSIS — M1990 Unspecified osteoarthritis, unspecified site: Secondary | ICD-10-CM | POA: Diagnosis present

## 2016-06-13 DIAGNOSIS — I10 Essential (primary) hypertension: Secondary | ICD-10-CM | POA: Diagnosis not present

## 2016-06-13 DIAGNOSIS — Z7983 Long term (current) use of bisphosphonates: Secondary | ICD-10-CM

## 2016-06-13 DIAGNOSIS — F0280 Dementia in other diseases classified elsewhere without behavioral disturbance: Secondary | ICD-10-CM | POA: Diagnosis not present

## 2016-06-13 DIAGNOSIS — N184 Chronic kidney disease, stage 4 (severe): Secondary | ICD-10-CM | POA: Diagnosis present

## 2016-06-13 DIAGNOSIS — M810 Age-related osteoporosis without current pathological fracture: Secondary | ICD-10-CM | POA: Diagnosis not present

## 2016-06-13 DIAGNOSIS — F0390 Unspecified dementia without behavioral disturbance: Secondary | ICD-10-CM | POA: Diagnosis present

## 2016-06-13 DIAGNOSIS — R2681 Unsteadiness on feet: Secondary | ICD-10-CM | POA: Diagnosis not present

## 2016-06-13 DIAGNOSIS — Z79899 Other long term (current) drug therapy: Secondary | ICD-10-CM

## 2016-06-13 DIAGNOSIS — Z888 Allergy status to other drugs, medicaments and biological substances status: Secondary | ICD-10-CM

## 2016-06-13 DIAGNOSIS — Z950 Presence of cardiac pacemaker: Secondary | ICD-10-CM

## 2016-06-13 DIAGNOSIS — I5032 Chronic diastolic (congestive) heart failure: Secondary | ICD-10-CM

## 2016-06-13 DIAGNOSIS — R079 Chest pain, unspecified: Secondary | ICD-10-CM | POA: Diagnosis not present

## 2016-06-13 DIAGNOSIS — I25110 Atherosclerotic heart disease of native coronary artery with unstable angina pectoris: Secondary | ICD-10-CM | POA: Diagnosis not present

## 2016-06-13 DIAGNOSIS — E1165 Type 2 diabetes mellitus with hyperglycemia: Secondary | ICD-10-CM | POA: Diagnosis not present

## 2016-06-13 DIAGNOSIS — D631 Anemia in chronic kidney disease: Secondary | ICD-10-CM | POA: Diagnosis present

## 2016-06-13 DIAGNOSIS — I447 Left bundle-branch block, unspecified: Secondary | ICD-10-CM | POA: Diagnosis present

## 2016-06-13 DIAGNOSIS — E785 Hyperlipidemia, unspecified: Secondary | ICD-10-CM | POA: Diagnosis not present

## 2016-06-13 DIAGNOSIS — R0602 Shortness of breath: Secondary | ICD-10-CM | POA: Diagnosis not present

## 2016-06-13 DIAGNOSIS — I5043 Acute on chronic combined systolic (congestive) and diastolic (congestive) heart failure: Secondary | ICD-10-CM | POA: Diagnosis not present

## 2016-06-13 DIAGNOSIS — I129 Hypertensive chronic kidney disease with stage 1 through stage 4 chronic kidney disease, or unspecified chronic kidney disease: Secondary | ICD-10-CM | POA: Diagnosis not present

## 2016-06-13 DIAGNOSIS — I110 Hypertensive heart disease with heart failure: Secondary | ICD-10-CM | POA: Diagnosis not present

## 2016-06-13 DIAGNOSIS — N179 Acute kidney failure, unspecified: Secondary | ICD-10-CM | POA: Diagnosis present

## 2016-06-13 DIAGNOSIS — K219 Gastro-esophageal reflux disease without esophagitis: Secondary | ICD-10-CM | POA: Diagnosis present

## 2016-06-13 DIAGNOSIS — N2581 Secondary hyperparathyroidism of renal origin: Secondary | ICD-10-CM | POA: Diagnosis present

## 2016-06-13 DIAGNOSIS — L8992 Pressure ulcer of unspecified site, stage 2: Secondary | ICD-10-CM | POA: Diagnosis not present

## 2016-06-13 DIAGNOSIS — J989 Respiratory disorder, unspecified: Secondary | ICD-10-CM | POA: Diagnosis not present

## 2016-06-13 DIAGNOSIS — I2510 Atherosclerotic heart disease of native coronary artery without angina pectoris: Secondary | ICD-10-CM | POA: Diagnosis not present

## 2016-06-13 DIAGNOSIS — Z7901 Long term (current) use of anticoagulants: Secondary | ICD-10-CM

## 2016-06-13 DIAGNOSIS — I517 Cardiomegaly: Secondary | ICD-10-CM | POA: Diagnosis not present

## 2016-06-13 DIAGNOSIS — Z952 Presence of prosthetic heart valve: Secondary | ICD-10-CM

## 2016-06-13 DIAGNOSIS — I5023 Acute on chronic systolic (congestive) heart failure: Secondary | ICD-10-CM

## 2016-06-13 DIAGNOSIS — M6281 Muscle weakness (generalized): Secondary | ICD-10-CM | POA: Diagnosis not present

## 2016-06-13 LAB — ECHOCARDIOGRAM COMPLETE
AO mean calculated velocity dopler: 117 cm/s
AOASC: 34 cm
AOVTI: 37.8 cm
AV Area VTI index: 1.05 cm2/m2
AV Area VTI: 1.7 cm2
AV Area mean vel: 1.89 cm2
AV area mean vel ind: 1.09 cm2/m2
AV peak Index: 0.98
AVA: 1.82 cm2
AVCELMEANRAT: 0.6
AVG: 7 mmHg
AVPG: 12 mmHg
AVPKVEL: 176 cm/s
Ao pk vel: 0.54 m/s
CHL CUP AV VALUE AREA INDEX: 1.05
CHL CUP AV VEL: 1.82
CHL CUP RV SYS PRESS: 39 mmHg
CHL CUP TV REG PEAK VELOCITY: 299 cm/s
EERAT: 10.2
EWDT: 218 ms
FS: 22 % — AB (ref 28–44)
HEIGHTINCHES: 57 in
IV/PV OW: 1.05
LA ID, A-P, ES: 39 mm
LA vol index: 36.1 mL/m2
LA vol: 62.9 mL
LADIAMINDEX: 2.24 cm/m2
LAVOLA4C: 55.3 mL
LEFT ATRIUM END SYS DIAM: 39 mm
LV SIMPSON'S DISK: 18
LV TDI E'LATERAL: 9
LV sys vol: 89 mL — AB (ref 14–42)
LVDIAVOL: 109 mL — AB (ref 46–106)
LVDIAVOLIN: 63 mL/m2
LVEEAVG: 10.2
LVEEMED: 10.2
LVELAT: 9 cm/s
LVOT SV: 69 mL
LVOT VTI: 21.9 cm
LVOT area: 3.14 cm2
LVOT diameter: 20 mm
LVOT peak VTI: 0.58 cm
LVOTPV: 95.1 cm/s
LVSYSVOLIN: 51 mL/m2
MV Dec: 218
MV Peak grad: 3 mmHg
MV pk A vel: 55.1 m/s
MV pk E vel: 91.8 m/s
PV Reg grad dias: 11 mmHg
PV Reg vel dias: 163 cm/s
PW: 9.85 mm — AB (ref 0.6–1.1)
RV TAPSE: 27.2 mm
Stroke v: 20 ml
TDI e' medial: 4.88
TRMAXVEL: 299 cm/s
WEIGHTICAEL: 2966.51 [oz_av]

## 2016-06-13 LAB — CBC WITH DIFFERENTIAL/PLATELET
Basophils Absolute: 0 10*3/uL (ref 0.0–0.1)
Basophils Relative: 0 %
EOS ABS: 0.1 10*3/uL (ref 0.0–0.7)
EOS PCT: 1 %
HCT: 36.9 % (ref 36.0–46.0)
Hemoglobin: 11.8 g/dL — ABNORMAL LOW (ref 12.0–15.0)
LYMPHS PCT: 9 %
Lymphs Abs: 1.3 10*3/uL (ref 0.7–4.0)
MCH: 30.4 pg (ref 26.0–34.0)
MCHC: 32 g/dL (ref 30.0–36.0)
MCV: 95.1 fL (ref 78.0–100.0)
MONO ABS: 1 10*3/uL (ref 0.1–1.0)
Monocytes Relative: 7 %
Neutro Abs: 11.6 10*3/uL — ABNORMAL HIGH (ref 1.7–7.7)
Neutrophils Relative %: 83 %
PLATELETS: 220 10*3/uL (ref 150–400)
RBC: 3.88 MIL/uL (ref 3.87–5.11)
RDW: 16 % — AB (ref 11.5–15.5)
WBC: 14 10*3/uL — ABNORMAL HIGH (ref 4.0–10.5)

## 2016-06-13 LAB — COMPREHENSIVE METABOLIC PANEL WITH GFR
ALT: 14 U/L (ref 14–54)
AST: 26 U/L (ref 15–41)
Albumin: 3.1 g/dL — ABNORMAL LOW (ref 3.5–5.0)
Alkaline Phosphatase: 59 U/L (ref 38–126)
Anion gap: 13 (ref 5–15)
BUN: 27 mg/dL — ABNORMAL HIGH (ref 6–20)
CO2: 22 mmol/L (ref 22–32)
Calcium: 9.2 mg/dL (ref 8.9–10.3)
Chloride: 108 mmol/L (ref 101–111)
Creatinine, Ser: 1.85 mg/dL — ABNORMAL HIGH (ref 0.44–1.00)
GFR calc Af Amer: 28 mL/min — ABNORMAL LOW (ref >60)
GFR calc non Af Amer: 24 mL/min — ABNORMAL LOW (ref >60)
Glucose, Bld: 166 mg/dL — ABNORMAL HIGH (ref 65–99)
Potassium: 3.8 mmol/L (ref 3.5–5.1)
Sodium: 143 mmol/L (ref 135–145)
Total Bilirubin: 0.8 mg/dL (ref 0.3–1.2)
Total Protein: 5.9 g/dL — ABNORMAL LOW (ref 6.5–8.1)

## 2016-06-13 LAB — GLUCOSE, CAPILLARY
GLUCOSE-CAPILLARY: 103 mg/dL — AB (ref 65–99)
Glucose-Capillary: 103 mg/dL — ABNORMAL HIGH (ref 65–99)

## 2016-06-13 LAB — I-STAT TROPONIN, ED: Troponin i, poc: 0.06 ng/mL (ref 0.00–0.08)

## 2016-06-13 LAB — PROTIME-INR
INR: 1.68
PROTHROMBIN TIME: 19.9 s — AB (ref 11.4–15.2)

## 2016-06-13 LAB — BRAIN NATRIURETIC PEPTIDE: B NATRIURETIC PEPTIDE 5: 528.3 pg/mL — AB (ref 0.0–100.0)

## 2016-06-13 LAB — MRSA PCR SCREENING: MRSA by PCR: POSITIVE — AB

## 2016-06-13 MED ORDER — WARFARIN SODIUM 5 MG PO TABS
2.5000 mg | ORAL_TABLET | Freq: Once | ORAL | Status: AC
  Administered 2016-06-13: 2.5 mg via ORAL
  Filled 2016-06-13: qty 1

## 2016-06-13 MED ORDER — FUROSEMIDE 10 MG/ML IJ SOLN
80.0000 mg | Freq: Four times a day (QID) | INTRAMUSCULAR | Status: DC
  Administered 2016-06-13 – 2016-06-14 (×3): 80 mg via INTRAVENOUS
  Filled 2016-06-13 (×3): qty 8

## 2016-06-13 MED ORDER — FEBUXOSTAT 40 MG PO TABS
40.0000 mg | ORAL_TABLET | Freq: Every evening | ORAL | Status: DC
  Administered 2016-06-13 – 2016-06-26 (×14): 40 mg via ORAL
  Filled 2016-06-13 (×16): qty 1

## 2016-06-13 MED ORDER — SODIUM CHLORIDE 0.9% FLUSH
3.0000 mL | INTRAVENOUS | Status: DC | PRN
  Administered 2016-06-13: 3 mL via INTRAVENOUS
  Filled 2016-06-13: qty 3

## 2016-06-13 MED ORDER — HYDRALAZINE HCL 25 MG PO TABS
25.0000 mg | ORAL_TABLET | Freq: Three times a day (TID) | ORAL | Status: DC
  Administered 2016-06-13 – 2016-06-20 (×22): 25 mg via ORAL
  Filled 2016-06-13 (×22): qty 1

## 2016-06-13 MED ORDER — INSULIN ASPART 100 UNIT/ML ~~LOC~~ SOLN
0.0000 [IU] | Freq: Three times a day (TID) | SUBCUTANEOUS | Status: DC
  Administered 2016-06-14: 8 [IU] via SUBCUTANEOUS
  Administered 2016-06-15 – 2016-06-16 (×4): 2 [IU] via SUBCUTANEOUS
  Administered 2016-06-18 – 2016-06-21 (×2): 3 [IU] via SUBCUTANEOUS
  Administered 2016-06-22 – 2016-06-26 (×2): 2 [IU] via SUBCUTANEOUS

## 2016-06-13 MED ORDER — MUPIROCIN 2 % EX OINT
1.0000 "application " | TOPICAL_OINTMENT | Freq: Two times a day (BID) | CUTANEOUS | Status: AC
  Administered 2016-06-13 – 2016-06-18 (×10): 1 via NASAL
  Filled 2016-06-13 (×2): qty 22

## 2016-06-13 MED ORDER — ONDANSETRON HCL 4 MG/2ML IJ SOLN: 4.0000 mg | Freq: Four times a day (QID) | INTRAMUSCULAR | Status: DC | PRN

## 2016-06-13 MED ORDER — CARVEDILOL 12.5 MG PO TABS
12.5000 mg | ORAL_TABLET | Freq: Two times a day (BID) | ORAL | Status: DC
  Administered 2016-06-13 – 2016-06-21 (×17): 12.5 mg via ORAL
  Filled 2016-06-13 (×17): qty 1

## 2016-06-13 MED ORDER — CHLORHEXIDINE GLUCONATE CLOTH 2 % EX PADS
6.0000 | MEDICATED_PAD | Freq: Every day | CUTANEOUS | Status: AC
  Administered 2016-06-14 – 2016-06-18 (×5): 6 via TOPICAL

## 2016-06-13 MED ORDER — SENNA 8.6 MG PO TABS
2.0000 | ORAL_TABLET | Freq: Two times a day (BID) | ORAL | Status: DC
  Administered 2016-06-13 – 2016-06-26 (×15): 17.2 mg via ORAL
  Administered 2016-06-26: 8.6 mg via ORAL
  Filled 2016-06-13 (×29): qty 2

## 2016-06-13 MED ORDER — IPRATROPIUM-ALBUTEROL 0.5-2.5 (3) MG/3ML IN SOLN
3.0000 mL | Freq: Four times a day (QID) | RESPIRATORY_TRACT | Status: DC | PRN
Start: 2016-06-13 — End: 2016-06-27

## 2016-06-13 MED ORDER — MENTHOL (TOPICAL ANALGESIC) 7.5 % EX PTCH: 3.0000 | MEDICATED_PATCH | Freq: Every day | CUTANEOUS | Status: DC

## 2016-06-13 MED ORDER — LINACLOTIDE 145 MCG PO CAPS
145.0000 ug | ORAL_CAPSULE | Freq: Every day | ORAL | Status: DC
  Administered 2016-06-13 – 2016-06-27 (×15): 145 ug via ORAL
  Filled 2016-06-13 (×15): qty 1

## 2016-06-13 MED ORDER — DONEPEZIL HCL 10 MG PO TABS
10.0000 mg | ORAL_TABLET | Freq: Every day | ORAL | Status: DC
  Administered 2016-06-13 – 2016-06-26 (×14): 10 mg via ORAL
  Filled 2016-06-13 (×14): qty 1

## 2016-06-13 MED ORDER — BISACODYL 10 MG RE SUPP: 10.0000 mg | Freq: Every day | RECTAL | Status: DC | PRN

## 2016-06-13 MED ORDER — LEVOTHYROXINE SODIUM 112 MCG PO TABS
112.0000 ug | ORAL_TABLET | Freq: Every day | ORAL | Status: DC
  Administered 2016-06-14 – 2016-06-15 (×2): 112 ug via ORAL
  Filled 2016-06-13 (×3): qty 1

## 2016-06-13 MED ORDER — MEMANTINE HCL 10 MG PO TABS
5.0000 mg | ORAL_TABLET | Freq: Two times a day (BID) | ORAL | Status: DC
  Administered 2016-06-13 – 2016-06-27 (×29): 5 mg via ORAL
  Filled 2016-06-13 (×29): qty 1

## 2016-06-13 MED ORDER — SODIUM CHLORIDE 0.9 % IV SOLN
250.0000 mL | INTRAVENOUS | Status: DC | PRN
Start: 2016-06-13 — End: 2016-06-26
  Administered 2016-06-13: 250 mL via INTRAVENOUS

## 2016-06-13 MED ORDER — CALCITRIOL 0.25 MCG PO CAPS
0.2500 ug | ORAL_CAPSULE | Freq: Every morning | ORAL | Status: DC
  Administered 2016-06-13 – 2016-06-27 (×15): 0.25 ug via ORAL
  Filled 2016-06-13 (×15): qty 1

## 2016-06-13 MED ORDER — CYCLOSPORINE 0.05 % OP EMUL
1.0000 [drp] | Freq: Two times a day (BID) | OPHTHALMIC | Status: DC
Start: 2016-06-13 — End: 2016-06-27
  Administered 2016-06-13 – 2016-06-27 (×28): 1 [drp] via OPHTHALMIC
  Filled 2016-06-13 (×29): qty 1

## 2016-06-13 MED ORDER — PANTOPRAZOLE SODIUM 40 MG PO TBEC
40.0000 mg | DELAYED_RELEASE_TABLET | Freq: Every day | ORAL | Status: DC
  Administered 2016-06-13 – 2016-06-27 (×15): 40 mg via ORAL
  Filled 2016-06-13 (×15): qty 1

## 2016-06-13 MED ORDER — DULOXETINE HCL 60 MG PO CPEP
60.0000 mg | ORAL_CAPSULE | Freq: Every evening | ORAL | Status: DC
  Administered 2016-06-13 – 2016-06-26 (×14): 60 mg via ORAL
  Filled 2016-06-13 (×14): qty 1

## 2016-06-13 MED ORDER — INSULIN ASPART 100 UNIT/ML ~~LOC~~ SOLN: 0.0000 [IU] | Freq: Every day | SUBCUTANEOUS | Status: DC

## 2016-06-13 MED ORDER — MUSCLE RUB 10-15 % EX CREA
TOPICAL_CREAM | Freq: Two times a day (BID) | CUTANEOUS | Status: DC
  Administered 2016-06-13 – 2016-06-15 (×6): via TOPICAL
  Administered 2016-06-16 (×2): 1 via TOPICAL
  Administered 2016-06-17 – 2016-06-25 (×18): via TOPICAL
  Administered 2016-06-26: 1 via TOPICAL
  Administered 2016-06-26 – 2016-06-27 (×2): via TOPICAL
  Filled 2016-06-13 (×3): qty 85

## 2016-06-13 MED ORDER — FUROSEMIDE 10 MG/ML IJ SOLN
80.0000 mg | Freq: Once | INTRAMUSCULAR | Status: AC
  Administered 2016-06-13: 80 mg via INTRAVENOUS
  Filled 2016-06-13: qty 8

## 2016-06-13 MED ORDER — HYDROCODONE-ACETAMINOPHEN 7.5-325 MG PO TABS
1.0000 | ORAL_TABLET | ORAL | Status: DC | PRN
Start: 2016-06-13 — End: 2016-06-27
  Administered 2016-06-13 – 2016-06-27 (×22): 1 via ORAL
  Filled 2016-06-13 (×24): qty 1

## 2016-06-13 MED ORDER — SODIUM CHLORIDE 0.9% FLUSH
3.0000 mL | Freq: Two times a day (BID) | INTRAVENOUS | Status: DC
  Administered 2016-06-13 – 2016-06-25 (×26): 3 mL via INTRAVENOUS

## 2016-06-13 MED ORDER — LORATADINE 10 MG PO TABS
10.0000 mg | ORAL_TABLET | Freq: Every day | ORAL | Status: DC
  Administered 2016-06-13 – 2016-06-27 (×15): 10 mg via ORAL
  Filled 2016-06-13 (×15): qty 1

## 2016-06-13 MED ORDER — ACETAMINOPHEN 325 MG PO TABS
650.0000 mg | ORAL_TABLET | ORAL | Status: DC | PRN
Start: 2016-06-13 — End: 2016-06-27

## 2016-06-13 MED ORDER — POTASSIUM CHLORIDE CRYS ER 20 MEQ PO TBCR
40.0000 meq | EXTENDED_RELEASE_TABLET | Freq: Three times a day (TID) | ORAL | Status: DC
  Administered 2016-06-13 (×3): 40 meq via ORAL
  Filled 2016-06-13 (×4): qty 2

## 2016-06-13 MED ORDER — SIMETHICONE 80 MG PO CHEW: 80.0000 mg | CHEWABLE_TABLET | Freq: Two times a day (BID) | ORAL | Status: DC | PRN

## 2016-06-13 MED ORDER — WARFARIN SODIUM 2.5 MG PO TABS
2.5000 mg | ORAL_TABLET | Freq: Once | ORAL | Status: DC
  Filled 2016-06-13: qty 1

## 2016-06-13 MED ORDER — WARFARIN - PHARMACIST DOSING INPATIENT: Freq: Every day | Status: DC

## 2016-06-13 MED ORDER — MENTHOL (TOPICAL ANALGESIC) 4 % EX GEL: 1.0000 "application " | Freq: Two times a day (BID) | CUTANEOUS | Status: DC

## 2016-06-13 MED ORDER — AMIODARONE HCL 100 MG PO TABS
100.0000 mg | ORAL_TABLET | Freq: Every day | ORAL | Status: DC
  Administered 2016-06-13 – 2016-06-27 (×15): 100 mg via ORAL
  Filled 2016-06-13 (×15): qty 1

## 2016-06-13 MED ORDER — PREGABALIN 25 MG PO CAPS
50.0000 mg | ORAL_CAPSULE | ORAL | Status: DC
  Administered 2016-06-13 – 2016-06-25 (×36): 50 mg via ORAL
  Filled 2016-06-13 (×36): qty 2

## 2016-06-13 MED ORDER — ATORVASTATIN CALCIUM 10 MG PO TABS
10.0000 mg | ORAL_TABLET | Freq: Every day | ORAL | Status: DC
  Administered 2016-06-13 – 2016-06-26 (×14): 10 mg via ORAL
  Filled 2016-06-13 (×14): qty 1

## 2016-06-13 NOTE — ED Notes (Signed)
Pharmacy made aware pt is on bipap. Po meds will be rescheduled

## 2016-06-13 NOTE — Progress Notes (Signed)
Echocardiogram 2D Echocardiogram has been performed.  [name] 06/13/2016, 2:24 PM

## 2016-06-13 NOTE — ED Provider Notes (Signed)
[facility]-EMERGENCY DEPT Provider Note   CSN: [id number] Arrival date & time: 06/13/16  0453     History   Chief Complaint Chief Complaint  Patient presents with  . Respiratory Distress    HPI [name] is a 83 y.o. female.  Patient found by NH staff in respiratory distress. Complains of chest pain. History of CHF, EMS report significant respiratory distress and hypoxia, not improved with NRB face mask O2. Placed on CPAP with significant improvement.       Past Medical History:  Diagnosis Date  . Acute on chronic renal failure [facility])    sees Dr Allena Katz   . Anemia    Acute blood loss  . Aortic regurgitation   . Aortic stenosis 10/13/2012   Low EF, low gradient with severe aortic stenosis confirmed by dobutamine stress echocardiogram s/p TAVR 12/2012  . Asthma   . Atrial fibrillation (HCC)    tachy-brady syndrome with <1% recurrent PAF since pacemaker placement  . Cataracts, bilateral   . Chronic combined systolic and diastolic CHF (congestive heart failure) (HCC)   . Chronic lower back pain   . CKD (chronic kidney disease)   . Coronary artery disease involving native coronary artery of native heart   . Dementia    Without behavioral disturbance  . Dysrhythmia   . Fibromyalgia   . Gastroesophageal reflux disease   . H/O dizziness   . H/O urinary frequency   . H/O: stroke   . Hard of hearing   . Headache    hx of migraines   . Heart murmur   . History of blood transfusion   . History of bronchitis   . History of kidney stones   . History of urinary tract infection   . HLD (hyperlipidemia)   . HTN (hypertension)   . Hypothyroidism   . Neuropathy (HCC)   . Nonischemic cardiomyopathy (HCC)   . On home oxygen therapy    patient uses at nite- 2L- has not used in > 6 months per patient  . Osteoarthritis   . Osteoporosis   . Pneumonia    hx of x 3   . PONV (postoperative nausea and vomiting)   . Presence of permanent cardiac pacemaker   . Pulmonary embolism  (HCC)    HISTORY OF, the pt. had a recurrent bilateral pulmonary emboli in 2005, on warfarin therapy and at which time she under went implantation of IVC filter  . Repeated falls   . Rupture of right patellar tendon   . Sciatica   . Shortness of breath dyspnea    with exertion   . Sleep apnea    uses oxygen at night and PRN- not used since > 6 months / DOES NOT USE  C-PAP  . Spinal stenosis   . Stroke (HCC)   . Symptomatic bradycardia   . Symptomatic bradycardia 2012   s/p Medtronic PPM  . Syncope   . Urinary incontinence   . Urinary urgency     Patient Active Problem List   Diagnosis Date Noted  . Acute respiratory failure (HCC) 06/13/2016  . Dementia without behavioral disturbance 01/09/2016  . Chronic constipation 01/09/2016  . Neuropathic pain 01/09/2016  . Esophageal reflux 01/09/2016  . Other specified hypothyroidism 01/09/2016  . CKD (chronic kidney disease) stage 4, GFR 15-29 ml/min (HCC) 01/09/2016  . Right patellar tendon rupture 06/25/2015  . Avulsion of right patellar tendon 03/08/2015  . Aspiration pneumonia (HCC) 12/14/2014  . Anemia of chronic disease 12/14/2014  .  Aspiration into airway 12/14/2014  . Swallowing dysfunction 12/14/2014  . S/P right TKA 12/04/2014  . S/P knee replacement 12/04/2014  . Acute blood loss anemia 12/28/2012  . Thrombocytopenia (HCC) 12/28/2012  . Toe fracture 12/28/2012  . CKD (chronic kidney disease), stage III 12/28/2012  . Low back pain 12/28/2012  . Wound dehiscence, surgical 12/28/2012  . Atelectasis 12/28/2012  . S/P TAVR (transcatheter aortic valve replacement) 12/21/2012  . Severe aortic stenosis 12/06/2012  . Aortic stenosis 10/13/2012  . Acute on chronic systolic and diastolic heart failure, NYHA class 3 (HCC) 10/13/2012  . Pacemaker 10/15/2010  . Paroxysmal atrial fibrillation (HCC) 10/15/2010  . Dyslipidemia 10/15/2010  . Hypertension 10/15/2010  . Chronic diastolic heart failure (HCC) 10/15/2010    Past  Surgical History:  Procedure Laterality Date  . ABDOMINAL HYSTERECTOMY    . APPENDECTOMY    . BACK SURGERY    . BUNIONECTOMY    . CARDIAC CATHETERIZATION    . CATARACT EXTRACTION    . CENTRAL VENOUS CATHETER INSERTION Left 12/21/2012   Procedure: INSERTION CENTRAL LINE ADULT;  Surgeon: Tonny Bollman, MD;  Location: [facility] OR;  Service: Open Heart Surgery;  Laterality: Left;  . ELBOW SURGERY     bilat   . INTRAOPERATIVE TRANSESOPHAGEAL ECHOCARDIOGRAM N/A 12/21/2012   Procedure: INTRAOPERATIVE TRANSESOPHAGEAL ECHOCARDIOGRAM;  Surgeon: Tonny Bollman, MD;  Location: [facility] OR;  Service: Open Heart Surgery;  Laterality: N/A;  . KNEE SURGERY Left   . LEFT AND RIGHT HEART CATHETERIZATION WITH CORONARY ANGIOGRAM N/A 10/18/2012   Procedure: LEFT AND RIGHT HEART CATHETERIZATION WITH CORONARY ANGIOGRAM;  Surgeon: Tonny Bollman, MD;  Location: [facility] CATH LAB;  Service: Cardiovascular;  Laterality: N/A;  . NASAL SEPTUM SURGERY    . NOSE SURGERY     X 2  . ORIF PATELLA Right 03/08/2015   Procedure:  OPEN REDUCTION INTERNAL FIXATION RIGHT  PATELLA TENDON AVULSION;  Surgeon: Durene Romans, MD;  Location: [facility] ORS;  Service: Orthopedics;  Laterality: Right;  . PACEMAKER INSERTION     Medtronic  . PATELLAR TENDON REPAIR Right 06/25/2015   Procedure: RIGHT PATELLA TENDON REVISION/REPAIR;  Surgeon: Durene Romans, MD;  Location: [facility] ORS;  Service: Orthopedics;  Laterality: Right;  . SHOULDER SURGERY     bilat   . THYROIDECTOMY, PARTIAL    . TONSILLECTOMY    . TOTAL KNEE ARTHROPLASTY Right 12/04/2014   Procedure: RIGHT TOTAL  KNEE ARTHROPLASTY;  Surgeon: Durene Romans, MD;  Location: [facility] ORS;  Service: Orthopedics;  Laterality: Right;  . TRANSCATHETER AORTIC VALVE REPLACEMENT, TRANSFEMORAL  12/21/2012   a. 29mm Edwards Sapien XT transcatheter heart valve placed via open left transfemoral approach b. Intra-op TEE: well-seated bioprosthetic aortic valve with mean gradient 2 mmHg, trivial AI, mild MR, EF 30-35%  . TRANSCATHETER  AORTIC VALVE REPLACEMENT, TRANSFEMORAL N/A 12/21/2012   Procedure: TRANSCATHETER AORTIC VALVE REPLACEMENT, TRANSFEMORAL;  Surgeon: Tonny Bollman, MD;  Location: [facility] OR;  Service: Open Heart Surgery;  Laterality: N/A;    OB History    No data available       Home Medications    Prior to Admission medications   Medication Sig Start Date End Date Taking? Authorizing Provider  alendronate (FOSAMAX) 70 MG/75ML solution Take 70 mg by mouth every 7 (seven) days. Take with a full glass of water on an empty stomach on Saturday    Historical Provider, MD  amiodarone (PACERONE) 100 MG tablet Take 100 mg by mouth daily.    Historical Provider, MD  atorvastatin (LIPITOR) 10 MG tablet  Take 10 mg by mouth daily at 6 PM.     Historical Provider, MD  BIOTIN 5000 PO Take 5,000 mcg by mouth every evening.     Historical Provider, MD  bisacodyl (DULCOLAX) 10 MG suppository Place 10 mg rectally daily as needed for moderate constipation.    Historical Provider, MD  calcitRIOL (ROCALTROL) 0.25 MCG capsule Take 0.25 mcg by mouth every morning.     Historical Provider, MD  carvedilol (COREG) 12.5 MG tablet Take 1 tablet by mouth 2 (two) times daily.  07/20/14   Historical Provider, MD  cetirizine (ZYRTEC) 10 MG tablet Take 10 mg by mouth daily.     Historical Provider, MD  Cholecalciferol (VITAMIN D-3) 1000 units CAPS Take 2 capsules by mouth. Take 2 capsules to = 2000 units PO QHS    Historical Provider, MD  cycloSPORINE (RESTASIS) 0.05 % ophthalmic emulsion Place 1 drop into both eyes 2 (two) times daily as needed (dry eyes.).    Historical Provider, MD  donepezil (ARICEPT) 10 MG tablet Take 10 mg by mouth at bedtime.     Historical Provider, MD  DULoxetine (CYMBALTA) 60 MG capsule Take 60 mg by mouth every evening.     Historical Provider, MD  esomeprazole (NEXIUM) 40 MG capsule Take 40 mg by mouth daily.     Historical Provider, MD  ferrous sulfate 325 (65 FE) MG tablet Take 325 mg by mouth daily.     Historical  Provider, MD  hydrALAZINE (APRESOLINE) 25 MG tablet Take 25 mg by mouth 3 (three) times daily.    Historical Provider, MD  HYDROcodone-acetaminophen (NORCO) 7.5-325 MG tablet Take 1-2 tablets by mouth every 4 (four) hours as needed for moderate pain.    Historical Provider, MD  ipratropium-albuterol (DUONEB) 0.5-2.5 (3) MG/3ML SOLN Take 3 mLs by nebulization every 6 (six) hours as needed.     Historical Provider, MD  levothyroxine (SYNTHROID, LEVOTHROID) 112 MCG tablet Take 112 mcg by mouth daily.     Historical Provider, MD  Linaclotide Karlene Einstein) 145 MCG CAPS capsule Take 145 mcg by mouth daily.     Historical Provider, MD  memantine (NAMENDA) 5 MG tablet Take 5 mg by mouth 2 (two) times daily.     Historical Provider, MD  Menthol (ICY HOT ADVANCED RELIEF) 7.5 % PTCH Apply 3 patches topically daily. Apply 1 patch to each knee and one patch across lower back.  Apply at 10AM and remove at 10PM    Historical Provider, MD  Menthol, Topical Analgesic, (BIOFREEZE) 4 % GEL Apply 1 application topically 2 (two) times daily. Apply to back of neck, lower back, and right leg    Historical Provider, MD  Multiple Vitamin (MULTIVITAMIN) tablet Take 1 tablet by mouth daily.     Historical Provider, MD  nitroGLYCERIN (NITROSTAT) 0.4 MG SL tablet Place 0.4 mg under the tongue every 5 (five) minutes as needed for chest pain (MAX 3 TABLETS).     Historical Provider, MD  polyethylene glycol (MIRALAX / GLYCOLAX) packet Take 17 g by mouth 2 (two) times daily. 12/07/14   Lanney Gins, PA-C  pramoxine (SARNA SENSITIVE) 1 % LOTN Apply 1 application topically 2 (two) times daily. Apply to BLE QAM and QPM    Historical Provider, MD  pregabalin (LYRICA) 50 MG capsule Take 50 mg by mouth 3 (three) times daily. 6AM, 2PM, 10PM    Historical Provider, MD  senna (SENOKOT) 8.6 MG TABS tablet Take 2 tablets by mouth 2 (two) times daily.  Historical Provider, MD  simethicone (MYLICON) 80 MG chewable tablet Chew 80 mg by mouth every  12 (twelve) hours as needed for flatulence.     Historical Provider, MD  torsemide (DEMADEX) 20 MG tablet Take 40 mg by mouth daily. Take 2 tablets to = 40 mg qd    Historical Provider, MD  Turmeric 500 MG CAPS Take 1 capsule by mouth 2 (two) times daily.    Historical Provider, MD  ULORIC 40 MG tablet Take 40 mg by mouth every evening.  12/02/12   Historical Provider, MD  vitamin B-12 (CYANOCOBALAMIN) 1000 MCG tablet Take 1,000 mcg by mouth 2 (two) times a week. Tuesday and Friday    Historical Provider, MD  warfarin (COUMADIN) 3 MG tablet Take 1.5 mg by mouth daily. Take 1/2 of a 3 mg tablet to = 1.5 mg qd    Historical Provider, MD    Family History Family History  Problem Relation Age of Onset  . Ovarian cancer Mother     Deceased  . Epilepsy Father     Deceased    Social History Social History  Substance Use Topics  . Smoking status: Never Smoker  . Smokeless tobacco: Never Used  . Alcohol use No     Allergies   Nubain [nalbuphine hcl]; Codeine; and Darvocet [propoxyphene n-acetaminophen]   Review of Systems Review of Systems  Unable to perform ROS: Acuity of condition     Physical Exam Updated Vital Signs BP 123/71 (BP Location: Right Arm)   Pulse 75   Temp 97 F (36.1 C) (Axillary)   Resp 18   Ht 4\' 10"  (1.473 m)   Wt 195 lb (88.5 kg)   SpO2 100%   BMI 40.76 kg/m   Physical Exam  Constitutional: She appears well-developed and well-nourished. No distress.  HENT:  Head: Normocephalic and atraumatic.  Right Ear: Hearing normal.  Left Ear: Hearing normal.  Nose: Nose normal.  Mouth/Throat: Oropharynx is clear and moist and mucous membranes are normal.  Eyes: Conjunctivae and EOM are normal. Pupils are equal, round, and reactive to light.  Neck: Normal range of motion. Neck supple.  Cardiovascular: Regular rhythm, S1 normal and S2 normal.  Exam reveals no gallop and no friction rub.   No murmur heard. Pulmonary/Chest: Effort normal. No respiratory  distress. She has rales (thoughout). She exhibits no tenderness.  Abdominal: Soft. Normal appearance and bowel sounds are normal. There is no hepatosplenomegaly. There is no tenderness. There is no rebound, no guarding, no tenderness at McBurney's point and negative Murphy's sign. No hernia.  Musculoskeletal: Normal range of motion. She exhibits edema (3+ pitting).  Neurological: She is alert. She has normal strength. No cranial nerve deficit or sensory deficit. Coordination normal. GCS eye subscore is 4. GCS verbal subscore is 5. GCS motor subscore is 6.  Skin: Skin is warm, dry and intact. No rash noted. No cyanosis.  Psychiatric: She has a normal mood and affect. Her speech is normal.  Nursing note and vitals reviewed.    ED Treatments / Results  Labs (all labs ordered are listed, but only abnormal results are displayed) Labs Reviewed  CBC WITH DIFFERENTIAL/PLATELET - Abnormal; Notable for the following:       Result Value   WBC 14.0 (*)    Hemoglobin 11.8 (*)    RDW 16.0 (*)    Neutro Abs 11.6 (*)    All other components within normal limits  COMPREHENSIVE METABOLIC PANEL - Abnormal; Notable for the following:  Glucose, Bld 166 (*)    BUN 27 (*)    Creatinine, Ser 1.85 (*)    Total Protein 5.9 (*)    Albumin 3.1 (*)    GFR calc non Af Amer 24 (*)    GFR calc Af Amer 28 (*)    All other components within normal limits  BRAIN NATRIURETIC PEPTIDE - Abnormal; Notable for the following:    B Natriuretic Peptide 528.3 (*)    All other components within normal limits  PROTIME-INR - Abnormal; Notable for the following:    Prothrombin Time 19.9 (*)    All other components within normal limits  I-STAT TROPOININ, ED    EKG  EKG Interpretation None       Radiology Dg Chest Port 1 View  Result Date: 06/13/2016 CLINICAL DATA:  83 year old female with shortness of breath. EXAM: PORTABLE CHEST 1 VIEW COMPARISON:  Chest CT dated 12/14/2014 under radiograph dated 11/29/2014  FINDINGS: There is stable cardiomegaly. An aortic valve stent is noted. Central vascular prominence and diffuse interstitial densities noted likely mild vascular congestion and edema. Bilateral lower lung field hazy densities may represent atelectasis versus infiltrate. There is blunting of the left costophrenic angle which may represent a small pleural effusion. There is no pneumothorax. Right pectoral pacemaker device noted. No acute osseous pathology. IMPRESSION: 1. Cardiomegaly with probable mild congestive changes and interstitial edema. 2. Bibasilar atelectasis versus infiltrate. 3. Probable small left pleural effusion. Electronically Signed   By: Elgie Collard M.D.   On: 06/13/2016 05:18    Procedures Procedures (including critical care time)  Medications Ordered in ED Medications  furosemide (LASIX) injection 80 mg (not administered)     Initial Impression / Assessment and Plan / ED Course  I have reviewed the triage vital signs and the nursing notes.  Pertinent labs & imaging results that were available during my care of the patient were reviewed by me and considered in my medical decision making (see chart for details).     Presents to emergency department for evaluation of respiratory distress. She has a history of congestive heart failure. Patient suddenly had onset of symptoms overnight. She was hypoxic and in distress, improved with CPAP initially, continued on BiPAP. In the ER. Workup suggestive of pulmonary edema. Patient administered IV Lasix, doing well. She will be admitted to the hospital.  CRITICAL CARE Performed by: Gilda Crease.   Total critical care time: 30 minutes  Critical care time was exclusive of separately billable procedures and treating other patients.  Critical care was necessary to treat or prevent imminent or life-threatening deterioration.  Critical care was time spent personally by me on the following activities: development of  treatment plan with patient and/or surrogate as well as nursing, discussions with consultants, evaluation of patient's response to treatment, examination of patient, obtaining history from patient or surrogate, ordering and performing treatments and interventions, ordering and review of laboratory studies, ordering and review of radiographic studies, pulse oximetry and re-evaluation of patient's condition.   Final Clinical Impressions(s) / ED Diagnoses   Final diagnoses:  Acute on chronic combined systolic and diastolic congestive heart failure [facility])    New Prescriptions New Prescriptions   No medications on file     Gilda Crease, MD 06/13/16 [phone]

## 2016-06-13 NOTE — Care Management Note (Signed)
Case Management Note  Patient Details  Name: [name] MRN: [id number] Date of Birth: 20-Jul-1932  Subjective/Objective:                  From [facility]/  83 y.o. female Patient found by NH staff in respiratory distress. Complains of chest pain. History of CHF, EMS report significant respiratory distress and hypoxia, not improved with NRB face mask O2. Placed on CPAP with significant improvement  Plan: Admit status INPATIENT (RESPIRATORY DISTRESS); anticipate discharge RETURN TO SNF.   Expected Discharge Date:   (unsure)               Expected Discharge Plan:  Skilled Nursing Facility  In-House Referral:  Clinical Social Work  Discharge planning Services  CM Consult  Post Acute Care Choice:    Choice offered to:     DME Arranged:    DME Agency:     HH Arranged:    HH Agency:     Status of Service:  In process, will continue to follow  If discussed at Long Length of Stay Meetings, dates discussed:    Additional Comments:  Oletta Cohn, RN 06/13/2016, 9:29 AM

## 2016-06-13 NOTE — ED Notes (Signed)
Respiratory at bedside attempting wean off bipap. Pt requested to have mask back.

## 2016-06-13 NOTE — H&P (Signed)
History and Physical  [name]:[id number] DOB: 07-Jan-1933 DOA: 06/13/2016  Referring physician: Dr Blinda Leatherwood, ED physician PCP: Oneal Grout, MD  Outpatient Specialists:   Excell Seltzer (Cardiology)  Patient Coming From: [facility]  Chief Complaint: SOB  HPI: [name] is a 83 y.o. female with a history of diastolic heart failure grade 1 an echocardiogram on 02/2015, CK D stage IV, aortic stenosis, dementia, chronic pain, hypertension, paroxysmal atrial fibrillation with chads 2 vasc score of 6 on coumadin. Patient is resident of [facility] health center presents with onset of respiratory distress. She was found to be in respiratory distress by nursing home staff, was brought to the emergency room for evaluation. Patient has dementia and is unable to provide history.  Emergency Department Course: CXR shows mild congestive heart failure. Elevated BNP at 528 slightly elevated white count.  Review of Systems:   Pt denies any fevers, chills, nausea, vomiting, diarrhea, constipation, abdominal pain, palpitations, headache, vision changes, lightheadedness, dizziness, melena, rectal bleeding.  Review of systems are otherwise negative  Past Medical History:  Diagnosis Date  . Acute on chronic renal failure [facility])    sees Dr Allena Katz   . Anemia    Acute blood loss  . Aortic regurgitation   . Aortic stenosis 10/13/2012   Low EF, low gradient with severe aortic stenosis confirmed by dobutamine stress echocardiogram s/p TAVR 12/2012  . Asthma   . Atrial fibrillation (HCC)    tachy-brady syndrome with <1% recurrent PAF since pacemaker placement  . Cataracts, bilateral   . Chronic combined systolic and diastolic CHF (congestive heart failure) (HCC)   . Chronic lower back pain   . CKD (chronic kidney disease)   . Coronary artery disease involving native coronary artery of native heart   . Dementia    Without behavioral disturbance  . Dysrhythmia   . Fibromyalgia   .  Gastroesophageal reflux disease   . H/O dizziness   . H/O urinary frequency   . H/O: stroke   . Hard of hearing   . Headache    hx of migraines   . Heart murmur   . History of blood transfusion   . History of bronchitis   . History of kidney stones   . History of urinary tract infection   . HLD (hyperlipidemia)   . HTN (hypertension)   . Hypothyroidism   . Neuropathy (HCC)   . Nonischemic cardiomyopathy (HCC)   . On home oxygen therapy    patient uses at nite- 2L- has not used in > 6 months per patient  . Osteoarthritis   . Osteoporosis   . Pneumonia    hx of x 3   . PONV (postoperative nausea and vomiting)   . Presence of permanent cardiac pacemaker   . Pulmonary embolism (HCC)    HISTORY OF, the pt. had a recurrent bilateral pulmonary emboli in 2005, on warfarin therapy and at which time she under went implantation of IVC filter  . Repeated falls   . Rupture of right patellar tendon   . Sciatica   . Shortness of breath dyspnea    with exertion   . Sleep apnea    uses oxygen at night and PRN- not used since > 6 months / DOES NOT USE  C-PAP  . Spinal stenosis   . Stroke (HCC)   . Symptomatic bradycardia   . Symptomatic bradycardia 2012   s/p Medtronic PPM  . Syncope   . Urinary incontinence   .  Urinary urgency    Past Surgical History:  Procedure Laterality Date  . ABDOMINAL HYSTERECTOMY    . APPENDECTOMY    . BACK SURGERY    . BUNIONECTOMY    . CARDIAC CATHETERIZATION    . CATARACT EXTRACTION    . CENTRAL VENOUS CATHETER INSERTION Left 12/21/2012   Procedure: INSERTION CENTRAL LINE ADULT;  Surgeon: Tonny Bollman, MD;  Location: [facility] OR;  Service: Open Heart Surgery;  Laterality: Left;  . ELBOW SURGERY     bilat   . INTRAOPERATIVE TRANSESOPHAGEAL ECHOCARDIOGRAM N/A 12/21/2012   Procedure: INTRAOPERATIVE TRANSESOPHAGEAL ECHOCARDIOGRAM;  Surgeon: Tonny Bollman, MD;  Location: [facility] OR;  Service: Open Heart Surgery;  Laterality: N/A;  . KNEE SURGERY Left   . LEFT AND  RIGHT HEART CATHETERIZATION WITH CORONARY ANGIOGRAM N/A 10/18/2012   Procedure: LEFT AND RIGHT HEART CATHETERIZATION WITH CORONARY ANGIOGRAM;  Surgeon: Tonny Bollman, MD;  Location: [facility] CATH LAB;  Service: Cardiovascular;  Laterality: N/A;  . NASAL SEPTUM SURGERY    . NOSE SURGERY     X 2  . ORIF PATELLA Right 03/08/2015   Procedure:  OPEN REDUCTION INTERNAL FIXATION RIGHT  PATELLA TENDON AVULSION;  Surgeon: Durene Romans, MD;  Location: [facility] ORS;  Service: Orthopedics;  Laterality: Right;  . PACEMAKER INSERTION     Medtronic  . PATELLAR TENDON REPAIR Right 06/25/2015   Procedure: RIGHT PATELLA TENDON REVISION/REPAIR;  Surgeon: Durene Romans, MD;  Location: [facility] ORS;  Service: Orthopedics;  Laterality: Right;  . SHOULDER SURGERY     bilat   . THYROIDECTOMY, PARTIAL    . TONSILLECTOMY    . TOTAL KNEE ARTHROPLASTY Right 12/04/2014   Procedure: RIGHT TOTAL  KNEE ARTHROPLASTY;  Surgeon: Durene Romans, MD;  Location: [facility] ORS;  Service: Orthopedics;  Laterality: Right;  . TRANSCATHETER AORTIC VALVE REPLACEMENT, TRANSFEMORAL  12/21/2012   a. 53mm Edwards Sapien XT transcatheter heart valve placed via open left transfemoral approach b. Intra-op TEE: well-seated bioprosthetic aortic valve with mean gradient 2 mmHg, trivial AI, mild MR, EF 30-35%  . TRANSCATHETER AORTIC VALVE REPLACEMENT, TRANSFEMORAL N/A 12/21/2012   Procedure: TRANSCATHETER AORTIC VALVE REPLACEMENT, TRANSFEMORAL;  Surgeon: Tonny Bollman, MD;  Location: [facility] OR;  Service: Open Heart Surgery;  Laterality: N/A;   Social History:  reports that she has never smoked. She has never used smokeless tobacco. She reports that she does not drink alcohol or use drugs. [facility]  Allergies  Allergen Reactions  . Nubain [Nalbuphine Hcl] Hives    Went into cardiac arrest   . Codeine Hives and Nausea Only  . Darvocet [Propoxyphene N-Acetaminophen] Nausea Only    Family History  Problem Relation Age of Onset  . Ovarian cancer  Mother     Deceased  . Epilepsy Father     Deceased      Prior to Admission medications   Medication Sig Start Date End Date Taking? Authorizing Provider  alendronate (FOSAMAX) 70 MG/75ML solution Take 70 mg by mouth every 7 (seven) days. Take with a full glass of water on an empty stomach on Saturday   Yes Historical Provider, MD  amiodarone (PACERONE) 100 MG tablet Take 100 mg by mouth daily.   Yes Historical Provider, MD  atorvastatin (LIPITOR) 10 MG tablet Take 10 mg by mouth daily at 6 PM.    Yes Historical Provider, MD  BIOTIN 5000 PO Take 5,000 mcg by mouth every evening.    Yes Historical Provider, MD  bisacodyl (DULCOLAX) 10 MG suppository Place 10 mg  rectally daily as needed for moderate constipation.   Yes Historical Provider, MD  calcitRIOL (ROCALTROL) 0.25 MCG capsule Take 0.25 mcg by mouth every morning.    Yes Historical Provider, MD  carvedilol (COREG) 12.5 MG tablet Take 1 tablet by mouth 2 (two) times daily.  07/20/14  Yes Historical Provider, MD  cetirizine (ZYRTEC) 10 MG tablet Take 10 mg by mouth daily.    Yes Historical Provider, MD  Cholecalciferol (VITAMIN D-3) 1000 units CAPS Take 2 capsules by mouth. Take 2 capsules to = 2000 units PO QHS   Yes Historical Provider, MD  cycloSPORINE (RESTASIS) 0.05 % ophthalmic emulsion Place 1 drop into both eyes 2 (two) times daily.    Yes Historical Provider, MD  donepezil (ARICEPT) 10 MG tablet Take 10 mg by mouth at bedtime.    Yes Historical Provider, MD  DULoxetine (CYMBALTA) 60 MG capsule Take 60 mg by mouth every evening.    Yes Historical Provider, MD  esomeprazole (NEXIUM) 40 MG capsule Take 40 mg by mouth daily.    Yes Historical Provider, MD  ferrous sulfate 325 (65 FE) MG tablet Take 325 mg by mouth daily.    Yes Historical Provider, MD  hydrALAZINE (APRESOLINE) 25 MG tablet Take 25 mg by mouth 3 (three) times daily.   Yes Historical Provider, MD  HYDROcodone-acetaminophen (NORCO) 7.5-325 MG tablet Take 1-2 tablets by  mouth every 4 (four) hours as needed for moderate pain.   Yes Historical Provider, MD  ipratropium-albuterol (DUONEB) 0.5-2.5 (3) MG/3ML SOLN Take 3 mLs by nebulization every 6 (six) hours as needed (wheezing/shortness of breath).   Yes Historical Provider, MD  levothyroxine (SYNTHROID, LEVOTHROID) 112 MCG tablet Take 112 mcg by mouth daily.    Yes Historical Provider, MD  Linaclotide Karlene Einstein) 145 MCG CAPS capsule Take 145 mcg by mouth daily.    Yes Historical Provider, MD  memantine (NAMENDA) 5 MG tablet Take 5 mg by mouth 2 (two) times daily.    Yes Historical Provider, MD  Menthol (ICY HOT ADVANCED RELIEF) 7.5 % PTCH Apply 3 patches topically daily. Apply 1 patch to each knee and one patch across lower back.  Apply at 10AM and remove at 10PM   Yes Historical Provider, MD  Menthol, Topical Analgesic, (BIOFREEZE) 4 % GEL Apply 1 application topically 2 (two) times daily. Apply to back of neck, lower back, and right leg   Yes Historical Provider, MD  Multiple Vitamin (MULTIVITAMIN) tablet Take 1 tablet by mouth daily.    Yes Historical Provider, MD  nitroGLYCERIN (NITROSTAT) 0.4 MG SL tablet Place 0.4 mg under the tongue every 5 (five) minutes as needed for chest pain (MAX 3 TABLETS).    Yes Historical Provider, MD  polyethylene glycol (MIRALAX / GLYCOLAX) packet Take 17 g by mouth 2 (two) times daily. 12/07/14  Yes Matthew Babish, PA-C  pramoxine (SARNA SENSITIVE) 1 % LOTN Apply 1 application topically daily as needed (itching).    Yes Historical Provider, MD  pregabalin (LYRICA) 50 MG capsule Take 50 mg by mouth 3 (three) times daily. 6AM, 2PM, 10PM   Yes Historical Provider, MD  senna (SENOKOT) 8.6 MG TABS tablet Take 2 tablets by mouth 2 (two) times daily.   Yes Historical Provider, MD  simethicone (MYLICON) 80 MG chewable tablet Chew 80 mg by mouth every 12 (twelve) hours as needed for flatulence.    Yes Historical Provider, MD  torsemide (DEMADEX) 20 MG tablet Take 100 mg by mouth daily.    Yes  Historical Provider,  MD  Turmeric 500 MG CAPS Take 1 capsule by mouth 2 (two) times daily.   Yes Historical Provider, MD  ULORIC 40 MG tablet Take 40 mg by mouth every evening.  12/02/12  Yes Historical Provider, MD  vitamin B-12 (CYANOCOBALAMIN) 1000 MCG tablet Take 1,000 mcg by mouth 2 (two) times a week. Tuesday and Friday   Yes Historical Provider, MD  warfarin (COUMADIN) 3 MG tablet Take 1.5 mg by mouth daily. Take 1/2 of a 3 mg tablet to = 1.5 mg qd   Yes Historical Provider, MD    Physical Exam: BP 123/71 (BP Location: Right Arm)   Pulse 75   Temp 97 F (36.1 C) (Axillary)   Resp 18   Ht 4\' 10"  (1.473 m)   Wt 88.5 kg (195 lb)   SpO2 100%   BMI 40.76 kg/m   General: Elderly Caucasian female. Awake and alert and oriented x3. No acute cardiopulmonary distress.  HEENT: Normocephalic atraumatic.  Right and left ears normal in appearance.  Pupils equal, round, reactive to light. Extraocular muscles are intact. Sclerae anicteric and noninjected.  Moist mucosal membranes. No mucosal lesions.  Neck: Neck supple without lymphadenopathy. No carotid bruits. No masses palpated.  Cardiovascular: Regular rate with normal S1-S2 sounds. No murmurs, rubs, gallops auscultated. No JVD.  Respiratory: Rales in bases Abdomen: Soft, nontender, nondistended. Active bowel sounds. No masses or hepatosplenomegaly  Skin: No rashes, lesions, or ulcerations.  Dry, warm to touch. 2+ dorsalis pedis and radial pulses. Musculoskeletal: No calf or leg pain. All major joints not erythematous nontender.  No upper or lower joint deformation.  Good ROM.  No contractures  Psychiatric: Intact judgment and insight. Pleasant and cooperative. Neurologic: No focal neurological deficits. Strength is 5/5 and symmetric in upper and lower extremities.  Cranial nerves II through XII are grossly intact.           Labs on Admission: I have personally reviewed following labs and imaging studies  CBC:  Recent Labs Lab  06/13/16 0521  WBC 14.0*  NEUTROABS 11.6*  HGB 11.8*  HCT 36.9  MCV 95.1  PLT 220   Basic Metabolic Panel:  Recent Labs Lab 06/13/16 0521  NA 143  K 3.8  CL 108  CO2 22  GLUCOSE 166*  BUN 27*  CREATININE 1.85*  CALCIUM 9.2   GFR: Estimated Creatinine Clearance: 22.2 mL/min (by C-G formula based on SCr of 1.85 mg/dL (H)). Liver Function Tests:  Recent Labs Lab 06/13/16 0521  AST 26  ALT 14  ALKPHOS 59  BILITOT 0.8  PROT 5.9*  ALBUMIN 3.1*   No results for input(s): LIPASE, AMYLASE in the last 168 hours. No results for input(s): AMMONIA in the last 168 hours. Coagulation Profile:  Recent Labs Lab 06/13/16 0530  INR 1.68   Cardiac Enzymes: No results for input(s): CKTOTAL, CKMB, CKMBINDEX, TROPONINI in the last 168 hours. BNP (last 3 results) No results for input(s): PROBNP in the last 8760 hours. HbA1C: No results for input(s): HGBA1C in the last 72 hours. CBG: No results for input(s): GLUCAP in the last 168 hours. Lipid Profile: No results for input(s): CHOL, HDL, LDLCALC, TRIG, CHOLHDL, LDLDIRECT in the last 72 hours. Thyroid Function Tests: No results for input(s): TSH, T4TOTAL, FREET4, T3FREE, THYROIDAB in the last 72 hours. Anemia Panel: No results for input(s): VITAMINB12, FOLATE, FERRITIN, TIBC, IRON, RETICCTPCT in the last 72 hours. Urine analysis:    Component Value Date/Time   COLORURINE YELLOW 06/19/2015 1339   APPEARANCEUR CLEAR  06/19/2015 1339   LABSPEC 1.007 06/19/2015 1339   PHURINE 6.0 06/19/2015 1339   GLUCOSEU NEGATIVE 06/19/2015 1339   HGBUR NEGATIVE 06/19/2015 1339   BILIRUBINUR NEGATIVE 06/19/2015 1339   KETONESUR NEGATIVE 06/19/2015 1339   PROTEINUR NEGATIVE 06/19/2015 1339   UROBILINOGEN 0.2 11/29/2014 1411   NITRITE NEGATIVE 06/19/2015 1339   LEUKOCYTESUR NEGATIVE 06/19/2015 1339   Sepsis Labs: @LABRCNTIP (procalcitonin:4,lacticidven:4) )No results found for this or any previous visit (from the past 240 hour(s)).    Radiological Exams on Admission: Dg Chest Port 1 View  Result Date: 06/13/2016 CLINICAL DATA:  83 year old female with shortness of breath. EXAM: PORTABLE CHEST 1 VIEW COMPARISON:  Chest CT dated 12/14/2014 under radiograph dated 11/29/2014 FINDINGS: There is stable cardiomegaly. An aortic valve stent is noted. Central vascular prominence and diffuse interstitial densities noted likely mild vascular congestion and edema. Bilateral lower lung field hazy densities may represent atelectasis versus infiltrate. There is blunting of the left costophrenic angle which may represent a small pleural effusion. There is no pneumothorax. Right pectoral pacemaker device noted. No acute osseous pathology. IMPRESSION: 1. Cardiomegaly with probable mild congestive changes and interstitial edema. 2. Bibasilar atelectasis versus infiltrate. 3. Probable small left pleural effusion. Electronically Signed   By: Elgie Collard M.D.   On: 06/13/2016 05:18    EKG: Independently reviewed. Sinus rhythm with left bundle branch block  Assessment/Plan: Principal Problem:   Acute respiratory failure (HCC) Active Problems:   Paroxysmal atrial fibrillation (HCC)   Hypertension   Acute on chronic systolic and diastolic heart failure, NYHA class 3 (HCC)   Dementia without behavioral disturbance   CKD (chronic kidney disease) stage 4, GFR 15-29 ml/min (HCC)   Elevated blood sugar   This patient was discussed with the ED physician, including pertinent vitals, physical exam findings, labs, and imaging.  We also discussed care given by the ED provider.  #1 Acute respiratory failure  Stepdown admission   Cont BiPap #2 acute on chronic diastolic heart failure Telemetry monitoring Strict I/O Daily Weights Diuresis: Lasix 80mg  q6 Potassium: 40 mEq TID by mouth Echo cardiac exam tomorrow Repeat BMP tomorrow #3 CK D  Renally dose #4 paroxysmal atrophy ablation  Coumadin per pharmacy  Cont amiodarone #5 HTN #6  Dementia  Continue Namenda and Aricept #7 Diabetes  HgA1C, sliding-scale insulin, CBGs before meals and daily at bedtime  DVT prophylaxis: coumadin Consultants: none Code Status: Full code Family Communication: with patient's dementia, attempted to contact son, but unable to  Disposition Plan: return to SNF after hospitalization   Levie Heritage, DO [facility] Pager [phone]  If 7PM-7AM, please contact night-coverage [email or web address] Password [email or web address]

## 2016-06-13 NOTE — ED Notes (Addendum)
Pt Belongings remaining with pt-  5 bracelets 1 necklace  1 ring  Verified with Victorino Dike RN on 4E  Verified with Sabino Gasser from ED at bedside, pt request valuable to be given to security.   Valuables placed in security envelope and delivered to security office in ED by Unit AD Clarissa.

## 2016-06-13 NOTE — Care Management Note (Signed)
Case Management Note  Patient Details  Name: [name] MRN: [id number] Date of Birth: Feb 07, 1933  Subjective/Objective:    Pt is from [facility] and CSW is following.                    Expected Discharge Plan:  Skilled Nursing Facility  In-House Referral:  Clinical Social Work  Discharge planning Services  CM Consult  Status of Service:  Completed, signed off  Magdalene River, [place] 06/13/2016, 1:32 PM

## 2016-06-13 NOTE — ED Triage Notes (Signed)
Pt brought to ED by [facility] from [facility] center for respiratory distress, pt placed on cpap by EMS after SPO2 90% on NRM, SPO2 up to 93% on CPAP. 5 mg Albuterol neb tx and 1 nitro sl given by EMS PTA to ED. BP 193/93, HR 74, R-29, SR on monitor.

## 2016-06-13 NOTE — Progress Notes (Signed)
ANTICOAGULATION CONSULT NOTE - Initial Consult  Pharmacy Consult for warfarin Indication: atrial fibrillation  Allergies  Allergen Reactions  . Nubain [Nalbuphine Hcl] Hives    Went into cardiac arrest   . Codeine Hives and Nausea Only  . Darvocet [Propoxyphene N-Acetaminophen] Nausea Only    Patient Measurements: Height: 4\' 10"  (147.3 cm) Weight: 195 lb (88.5 kg) IBW/kg (Calculated) : 40.9  Vital Signs: Temp: 97 F (36.1 C) (02/23 0503) Temp Source: Axillary (02/23 0503) BP: 163/77 (02/23 1003) Pulse Rate: 62 (02/23 1003)  Labs:  Recent Labs  06/13/16 0521 06/13/16 0530  HGB 11.8*  --   HCT 36.9  --   PLT 220  --   LABPROT  --  19.9*  INR  --  1.68  CREATININE 1.85*  --     Estimated Creatinine Clearance: 22.2 mL/min (by C-G formula based on SCr of 1.85 mg/dL (H)).   Medical History: Past Medical History:  Diagnosis Date  . Acute on chronic renal failure [facility])    sees Dr Allena Katz   . Anemia    Acute blood loss  . Aortic regurgitation   . Aortic stenosis 10/13/2012   Low EF, low gradient with severe aortic stenosis confirmed by dobutamine stress echocardiogram s/p TAVR 12/2012  . Asthma   . Atrial fibrillation (HCC)    tachy-brady syndrome with <1% recurrent PAF since pacemaker placement  . Cataracts, bilateral   . Chronic combined systolic and diastolic CHF (congestive heart failure) (HCC)   . Chronic lower back pain   . CKD (chronic kidney disease)   . Coronary artery disease involving native coronary artery of native heart   . Dementia    Without behavioral disturbance  . Dysrhythmia   . Fibromyalgia   . Gastroesophageal reflux disease   . H/O dizziness   . H/O urinary frequency   . H/O: stroke   . Hard of hearing   . Headache    hx of migraines   . Heart murmur   . History of blood transfusion   . History of bronchitis   . History of kidney stones   . History of urinary tract infection   . HLD (hyperlipidemia)   . HTN (hypertension)   .  Hypothyroidism   . Neuropathy (HCC)   . Nonischemic cardiomyopathy (HCC)   . On home oxygen therapy    patient uses at nite- 2L- has not used in > 6 months per patient  . Osteoarthritis   . Osteoporosis   . Pneumonia    hx of x 3   . PONV (postoperative nausea and vomiting)   . Presence of permanent cardiac pacemaker   . Pulmonary embolism (HCC)    HISTORY OF, the pt. had a recurrent bilateral pulmonary emboli in 2005, on warfarin therapy and at which time she under went implantation of IVC filter  . Repeated falls   . Rupture of right patellar tendon   . Sciatica   . Shortness of breath dyspnea    with exertion   . Sleep apnea    uses oxygen at night and PRN- not used since > 6 months / DOES NOT USE  C-PAP  . Spinal stenosis   . Stroke (HCC)   . Symptomatic bradycardia   . Symptomatic bradycardia 2012   s/p Medtronic PPM  . Syncope   . Urinary incontinence   . Urinary urgency     Assessment: 84 yof on chronic warfarin for history of afib. INR is subtherapeutic today at 1.68.  Hgb slightly low and platelets are WNL. No bleeding noted.   Goal of Therapy:  INR 2-3 Monitor platelets by anticoagulation protocol: Yes   Plan:  Warfarin 2.5mg  PO x 1  Daily INR  Meisha Salone, Drake Leach 06/13/2016,10:34 AM

## 2016-06-13 NOTE — Progress Notes (Signed)
Patient is a long-term care resident at [facility]. CSW following for disposition and return to SNF when medically appropriate for discharge.    Enos Fling, MSW, LCSW [facility] ED/55M Clinical Social Worker [phone]

## 2016-06-13 NOTE — Progress Notes (Signed)
Patient removed from BIPAP and placed on 4LNC with no issues or desaturations. Patient states she feels like she is breathing much better now and that she normally wears 3-4LNC at [facility].

## 2016-06-14 DIAGNOSIS — I10 Essential (primary) hypertension: Secondary | ICD-10-CM

## 2016-06-14 DIAGNOSIS — I5043 Acute on chronic combined systolic (congestive) and diastolic (congestive) heart failure: Secondary | ICD-10-CM

## 2016-06-14 DIAGNOSIS — I480 Paroxysmal atrial fibrillation: Secondary | ICD-10-CM

## 2016-06-14 DIAGNOSIS — I5023 Acute on chronic systolic (congestive) heart failure: Secondary | ICD-10-CM

## 2016-06-14 DIAGNOSIS — N184 Chronic kidney disease, stage 4 (severe): Secondary | ICD-10-CM

## 2016-06-14 DIAGNOSIS — I200 Unstable angina: Secondary | ICD-10-CM

## 2016-06-14 LAB — GLUCOSE, CAPILLARY
GLUCOSE-CAPILLARY: 95 mg/dL (ref 65–99)
GLUCOSE-CAPILLARY: 258 mg/dL — AB (ref 65–99)
GLUCOSE-CAPILLARY: 79 mg/dL (ref 65–99)
Glucose-Capillary: 91 mg/dL (ref 65–99)

## 2016-06-14 LAB — CBC
HEMATOCRIT: 35.1 % — AB (ref 36.0–46.0)
HEMOGLOBIN: 11.2 g/dL — AB (ref 12.0–15.0)
MCH: 30.8 pg (ref 26.0–34.0)
MCHC: 31.9 g/dL (ref 30.0–36.0)
MCV: 96.4 fL (ref 78.0–100.0)
Platelets: 169 10*3/uL (ref 150–400)
RBC: 3.64 MIL/uL — AB (ref 3.87–5.11)
RDW: 15.8 % — ABNORMAL HIGH (ref 11.5–15.5)
WBC: 8.9 10*3/uL (ref 4.0–10.5)

## 2016-06-14 LAB — BASIC METABOLIC PANEL
ANION GAP: 8 (ref 5–15)
BUN: 30 mg/dL — ABNORMAL HIGH (ref 6–20)
CALCIUM: 9 mg/dL (ref 8.9–10.3)
CO2: 31 mmol/L (ref 22–32)
CREATININE: 1.97 mg/dL — AB (ref 0.44–1.00)
Chloride: 102 mmol/L (ref 101–111)
GFR calc Af Amer: 26 mL/min — ABNORMAL LOW (ref >60)
GFR calc non Af Amer: 22 mL/min — ABNORMAL LOW (ref >60)
GLUCOSE: 98 mg/dL (ref 65–99)
Potassium: 5.1 mmol/L (ref 3.5–5.1)
Sodium: 141 mmol/L (ref 135–145)

## 2016-06-14 LAB — CREATININE, SERUM
Creatinine, Ser: 2.03 mg/dL — ABNORMAL HIGH (ref 0.44–1.00)
GFR, EST AFRICAN AMERICAN: 25 mL/min — AB (ref >60)
GFR, EST NON AFRICAN AMERICAN: 22 mL/min — AB (ref >60)

## 2016-06-14 LAB — PROTIME-INR
INR: 1.77
Prothrombin Time: 20.9 seconds — ABNORMAL HIGH (ref 11.4–15.2)

## 2016-06-14 MED ORDER — WARFARIN - PHARMACIST DOSING INPATIENT
Freq: Every day | Status: DC
  Administered 2016-06-16: 1

## 2016-06-14 MED ORDER — FUROSEMIDE 10 MG/ML IJ SOLN
80.0000 mg | Freq: Two times a day (BID) | INTRAMUSCULAR | Status: DC
  Administered 2016-06-14: 80 mg via INTRAVENOUS
  Filled 2016-06-14: qty 8

## 2016-06-14 MED ORDER — HEPARIN SODIUM (PORCINE) 5000 UNIT/ML IJ SOLN
5000.0000 [IU] | Freq: Three times a day (TID) | INTRAMUSCULAR | Status: DC
  Administered 2016-06-14 – 2016-06-27 (×39): 5000 [IU] via SUBCUTANEOUS
  Filled 2016-06-14 (×39): qty 1

## 2016-06-14 MED ORDER — WARFARIN SODIUM 5 MG PO TABS: 2.5000 mg | ORAL_TABLET | Freq: Once | ORAL | Status: DC

## 2016-06-14 MED ORDER — ORAL CARE MOUTH RINSE
15.0000 mL | Freq: Two times a day (BID) | OROMUCOSAL | Status: DC
  Administered 2016-06-14 – 2016-06-27 (×23): 15 mL via OROMUCOSAL

## 2016-06-14 NOTE — Progress Notes (Signed)
Called report to [address] [place] spoke to [employer]

## 2016-06-14 NOTE — Progress Notes (Signed)
ANTICOAGULATION CONSULT NOTE - Follow Up Consult  Pharmacy Consult for warfarin Indication: atrial fibrillation  Allergies  Allergen Reactions  . Nubain [Nalbuphine Hcl] Hives    Went into cardiac arrest   . Codeine Hives and Nausea Only  . Darvocet [Propoxyphene N-Acetaminophen] Nausea Only    Patient Measurements: Height: 4\' 9"  (144.8 cm) Weight: 175 lb 7.8 oz (79.6 kg) IBW/kg (Calculated) : 38.6  Vital Signs: Temp: 98.6 F (37 C) (02/24 0400) Temp Source: Oral (02/24 0400) BP: 151/59 (02/24 0903) Pulse Rate: 64 (02/24 0903)  Labs:  Recent Labs  06/13/16 0521 06/13/16 0530 06/14/16 0244  HGB 11.8*  --   --   HCT 36.9  --   --   PLT 220  --   --   LABPROT  --  19.9* 20.9*  INR  --  1.68 1.77  CREATININE 1.85*  --  1.97*    Estimated Creatinine Clearance: 19.1 mL/min (by C-G formula based on SCr of 1.97 mg/dL (H)).  Assessment: 83 yo F on PTA warfarin for history of afib. INR remains subtherapeutic today at 1.77. Hgb slightly low and platelets are WNL. No bleeding noted.   PTA dose: 1.5 mg daily  Goal of Therapy:  INR 2-3 Monitor platelets by anticoagulation protocol: Yes   Plan:  Warfarin 2.5mg  PO x 1  Daily INR Monitor CBC, s/sx of bleeding   Mackie Pai, PharmD PGY1 Pharmacy Resident Pager: [phone] 06/14/2016 10:41 AM

## 2016-06-14 NOTE — Progress Notes (Signed)
[facility] PROGRESS NOTE  [name] WNU:[id number] DOB: 1932/06/07 DOA: 06/13/2016  PCP: Oneal Grout, MD  Brief History/Interval Summary: 83 year old Caucasian female with a past medical history of diastolic and systolic CHF with echocardiogram in 2016 showing EF of 50%, chronic kidney disease stage IV, aortic stenosis, status post TAVR, history of dementia, history of paroxysmal atrial fibrillation on Coumadin who lives in a skilled nursing facility. She was brought in due to respiratory distress. Patient was found to have acute congestive heart failure and was hospitalized for further management.  Reason for Visit: Acute systolic CHF  Consultants: Cardiology  Procedures:  Transthoracic echocardiogram Study Conclusions  - Left ventricle: LVEF is approximately 35 to 40% with hypokinesis   of inferior, inferoseptal and anterior walls This is new compared   to echo of 2016. The cavity size was moderately dilated. Wall   thickness was normal. Features are consistent with a pseudonormal   left ventricular filling pattern, with concomitant abnormal   relaxation and increased filling pressure (grade 2 diastolic   dysfunction). Doppler parameters are consistent with high   ventricular filling pressure. - Aortic valve: AV prosthesis is difficult to see Peak and mean   gradients through the valve are 12 and 7 mm Hg respectively.   There is trace valvular AI Valve area (VTI): 1.82 cm^2. Valve   area (Vmax): 1.7 cm^2. Valve area (Vmean): 1.89 cm^2. - Mitral valve: There was mild to moderate regurgitation. - Left atrium: The atrium was mildly dilated. - Pulmonary arteries: PA peak pressure: 39 mm Hg (S).  Antibiotics: None  Subjective/Interval History: Patient feels better this morning. States that her shortness of breath has improved. She denies any chest pain. States that her leg swelling has improved as well.  ROS: Denies any nausea or vomiting  Objective:  Vital  Signs  Vitals:   06/14/16 0500 06/14/16 0530 06/14/16 0600 06/14/16 0903  BP: (!) 118/46   (!) 151/59  Pulse: 60  84 64  Resp: 15  (!) 21   Temp:      TempSrc:      SpO2: 97%  99%   Weight:  79.6 kg (175 lb 7.8 oz)    Height:        Intake/Output Summary (Last 24 hours) at 06/14/16 1051 Last data filed at 06/14/16 0910  Gross per 24 hour  Intake            777.5 ml  Output             2175 ml  Net          -1397.5 ml   Filed Weights   06/13/16 0504 06/13/16 1210 06/14/16 0530  Weight: 88.5 kg (195 lb) 84.1 kg (185 lb 6.5 oz) 79.6 kg (175 lb 7.8 oz)    General appearance: alert, cooperative, appears stated age and no distress Resp: Continues to have crackles bilateral bases. Some wheezing heard bilaterally. No rhonchi. Cardio: regular rate and rhythm, S1, S2 normal, no murmur, click, rub or gallop GI: soft, non-tender; bowel sounds normal; no masses,  no organomegaly Extremities: Minimal edema bilateral lower extremity Neurologic: Awake and alert. Oriented 3. No focal neurological deficits.  Lab Results:  Data Reviewed: I have personally reviewed following labs and imaging studies  CBC:  Recent Labs Lab 06/13/16 0521  WBC 14.0*  NEUTROABS 11.6*  HGB 11.8*  HCT 36.9  MCV 95.1  PLT 220    Basic Metabolic Panel:  Recent Labs Lab 06/13/16 0521 06/14/16 0244  NA 143 141  K 3.8 5.1  CL 108 102  CO2 22 31  GLUCOSE 166* 98  BUN 27* 30*  CREATININE 1.85* 1.97*  CALCIUM 9.2 9.0    GFR: Estimated Creatinine Clearance: 19.1 mL/min (by C-G formula based on SCr of 1.97 mg/dL (H)).  Liver Function Tests:  Recent Labs Lab 06/13/16 0521  AST 26  ALT 14  ALKPHOS 59  BILITOT 0.8  PROT 5.9*  ALBUMIN 3.1*    Coagulation Profile:  Recent Labs Lab 06/13/16 0530 06/14/16 0244  INR 1.68 1.77    CBG:  Recent Labs Lab 06/13/16 1705 06/13/16 2237 06/14/16 0839  GLUCAP 103* 103* 91     Recent Results (from the past 240 hour(s))  MRSA PCR  Screening     Status: Abnormal   Collection Time: 06/13/16 12:21 PM  Result Value Ref Range Status   MRSA by PCR POSITIVE (A) NEGATIVE Final    Comment:        The GeneXpert MRSA Assay (FDA approved for NASAL specimens only), is one component of a comprehensive MRSA colonization surveillance program. It is not intended to diagnose MRSA infection nor to guide or monitor treatment for MRSA infections. RESULT CALLED TO, READ BACK BY AND VERIFIED WITH: Tyler Aas RN 15:05 06/13/16 ([email or web address])       Radiology Studies: Dg Chest Port 1 View  Result Date: 06/13/2016 CLINICAL DATA:  83 year old female with shortness of breath. EXAM: PORTABLE CHEST 1 VIEW COMPARISON:  Chest CT dated 12/14/2014 under radiograph dated 11/29/2014 FINDINGS: There is stable cardiomegaly. An aortic valve stent is noted. Central vascular prominence and diffuse interstitial densities noted likely mild vascular congestion and edema. Bilateral lower lung field hazy densities may represent atelectasis versus infiltrate. There is blunting of the left costophrenic angle which may represent a small pleural effusion. There is no pneumothorax. Right pectoral pacemaker device noted. No acute osseous pathology. IMPRESSION: 1. Cardiomegaly with probable mild congestive changes and interstitial edema. 2. Bibasilar atelectasis versus infiltrate. 3. Probable small left pleural effusion. Electronically Signed   By: Elgie Collard M.D.   On: 06/13/2016 05:18     Medications:  Scheduled: . amiodarone  100 mg Oral Daily  . atorvastatin  10 mg Oral q1800  . calcitRIOL  0.25 mcg Oral q morning - 10a  . carvedilol  12.5 mg Oral BID  . Chlorhexidine Gluconate Cloth  6 each Topical Q0600  . cycloSPORINE  1 drop Both Eyes BID  . donepezil  10 mg Oral QHS  . DULoxetine  60 mg Oral QPM  . febuxostat  40 mg Oral QPM  . furosemide  80 mg Intravenous Q12H  . hydrALAZINE  25 mg Oral TID  . insulin aspart  0-15 Units Subcutaneous TID WC  .  insulin aspart  0-5 Units Subcutaneous QHS  . levothyroxine  112 mcg Oral QAC breakfast  . linaclotide  145 mcg Oral Daily  . loratadine  10 mg Oral Daily  . memantine  5 mg Oral BID  . mupirocin ointment  1 application Nasal BID  . MUSCLE RUB   Topical BID  . pantoprazole  40 mg Oral Daily  . pregabalin  50 mg Oral 3 times per day  . senna  2 tablet Oral BID  . sodium chloride flush  3 mL Intravenous Q12H  . warfarin  2.5 mg Oral ONCE-1800  . Warfarin - Pharmacist Dosing Inpatient   Does not apply q1800   Continuous:  TCY:ELYHTM chloride, acetaminophen, bisacodyl, HYDROcodone-acetaminophen, ipratropium-albuterol,  ondansetron (ZOFRAN) IV, simethicone, sodium chloride flush  Assessment/Plan:  Principal Problem:   Acute respiratory failure (HCC) Active Problems:   Paroxysmal atrial fibrillation (HCC)   Hypertension   Acute on chronic systolic and diastolic heart failure, NYHA class 3 (HCC)   Dementia without behavioral disturbance   CKD (chronic kidney disease) stage 4, GFR 15-29 ml/min (HCC)   Elevated blood sugar    Acute respiratory failure with hypoxia Patient initially required BiPAP. She stabilized after she was diuresed. Currently on oxygen by nasal cannula. Much improved.  Acute systolic CHF Echocardiogram shows that her EF is 35-40%. This is less than what it was in 2016. Wall motion abnormalities also noted. Continue with IV Lasix, but at a lower dose. Monitor ins and outs. Daily weights. Due to worsening cardiac function we will consult cardiology. Patient is on carvedilol. No ACE inhibitor due to renal failure.  History of aortic stenosis, status post TAVR Prosthetic valve seen on echocardiogram. Gradients could not be measured. Further management per cardiology.  History of paroxysmal atrial fibrillation Chads2vascular score is 6. Patient is on warfarin, which will be continued. Patient is also on amiodarone as well as beta blocker, which will also be  continued.  Chronic kidney disease, stage IV. Renal function is close to baseline. Monitor closely while she is being aggressively diuresed. Monitor urine output. Avoid nephrotoxic agents.  History of essential hypertension. Monitor blood pressures. Continue home medications.  History of dementia. Continue Namenda and Aricept. Mental status appears to be close to baseline. No neurological deficits.  History of hypothyroidism. Continue with levothyroxine. TSH was last checked in September and was 1.11. Due to worsening CHF, we will check thyroid function tests.  DVT Prophylaxis: On warfarin    Code Status: Full code  Family Communication: Discussed with the patient. No family at bedside  Disposition Plan: Cardiology consulted. Continue IV Lasix. PT and OT evaluation. Okay for transfer to telemetry.    LOS: 1 day   [facility]  [facility] Pager [phone] 06/14/2016, 10:51 AM  If 7PM-7AM, please contact night-coverage at [email or web address], password [facility]

## 2016-06-14 NOTE — Plan of Care (Signed)
Problem: Fluid Volume: Goal: Ability to maintain a balanced intake and output will improve Outcome: Progressing Discussed female external catheter and usage with some teach back displayed

## 2016-06-14 NOTE — Progress Notes (Signed)
Transferred pt to 3 [address] bed 11  per bed with  belongings Per NT & RN pt has belongings in the security lock up Yellow retrieval in chart

## 2016-06-14 NOTE — Consult Note (Signed)
Reason for Consult: CHF   Referring Physician: Dr. Maryland Pink   PCP:  Blanchie Serve, MD  Primary Cardiologist:Dr. Burt Knack EP Dr. [name] is an 83 y.o. female.    Chief Complaint: admitted 06/13/16 with SOB- respiratory distress with hypoxia and acute CHF.      HPI:   89 YOF with hx of cardiomyopathy, chronic systolic heart failure, and severe aortic stenosis. She underwent TAVR 12/21/2012 with a 29 mm Edwards Sapien XT valve. Other cardiac related problems include atrial fibrillation and hypertension. The patient has been chronically anticoagulated with warfarin. The patient has undergone pacemaker placement and she is followed by Dr. Lovena Le.  Last seen by Dr. Burt Knack 01/2014.   Other hx includes, dementia, chronic pain, lives at [facility].     Since admit she is neg 1517 ml. Weight is down from 185 to 175 lbs.  Troponin 0.06, BNP 528  Cr 1.85, now 1.97, K+ 5.1  WBC 14   EKG:  All personally reviewed :  SR with LBBB  On echo EF is down to 35-40% with hypokinesis G2DD. of inferior, inferoseptal and anterior walls This is new compared to echo of 2016. The cavity size was moderately dilated.   AV prosthesis is difficult to see Peak and mean gradients through the valve are 12 and 7 mm Hg respectively.  There is trace valvular AI Valve area (VTI): 1.82 cm^2. Valve area (Vmax): 1.7 cm^2. Valve area (Vmean): 1.89 cm^2.  Mild to moderate MR, mild TR and PA pk pressure 38 mmHg.    Currently resting well.  She does admit to occ chest pain, does not last long.  Usually 1-2 episodes per week.  She has had some this admit.   Last cath prior to TAVR in 09/2012 with patent coronary arteries with mild non obstructive disease--EF by echo at that time was 35%.  The EF did improve to 50-55% in 2016.   Past Medical History:  Diagnosis Date  . Acute on chronic renal failure [facility])    sees Dr Posey Pronto   . Anemia    Acute blood loss  . Aortic regurgitation   . Aortic stenosis  10/13/2012   Low EF, low gradient with severe aortic stenosis confirmed by dobutamine stress echocardiogram s/p TAVR 12/2012  . Asthma   . Atrial fibrillation (HCC)    tachy-brady syndrome with <1% recurrent PAF since pacemaker placement  . Cataracts, bilateral   . Chronic combined systolic and diastolic CHF (congestive heart failure) ([place])   . Chronic lower back pain   . CKD (chronic kidney disease)   . Coronary artery disease involving native coronary artery of native heart   . Dementia    Without behavioral disturbance  . Dysrhythmia   . Fibromyalgia   . Gastroesophageal reflux disease   . H/O dizziness   . H/O urinary frequency   . H/O: stroke   . Hard of hearing   . Headache    hx of migraines   . Heart murmur   . History of blood transfusion   . History of bronchitis   . History of kidney stones   . History of urinary tract infection   . HLD (hyperlipidemia)   . HTN (hypertension)   . Hypothyroidism   . Neuropathy ([place])   . Nonischemic cardiomyopathy ([place])   . On home oxygen therapy    patient uses at nite- 2L- has not used in > 6 months per patient  .  Osteoarthritis   . Osteoporosis   . Pneumonia    hx of x 3   . PONV (postoperative nausea and vomiting)   . Presence of permanent cardiac pacemaker   . Pulmonary embolism (HCC)    HISTORY OF, the pt. had a recurrent bilateral pulmonary emboli in 2005, on warfarin therapy and at which time she under went implantation of IVC filter  . Repeated falls   . Rupture of right patellar tendon   . Sciatica   . Shortness of breath dyspnea    with exertion   . Sleep apnea    uses oxygen at night and PRN- not used since > 6 months / DOES NOT USE  C-PAP  . Spinal stenosis   . Stroke ([place])   . Symptomatic bradycardia   . Symptomatic bradycardia 2012   s/p Medtronic PPM  . Syncope   . Urinary incontinence   . Urinary urgency     Past Surgical History:  Procedure Laterality Date  . ABDOMINAL HYSTERECTOMY    .  APPENDECTOMY    . BACK SURGERY    . BUNIONECTOMY    . CARDIAC CATHETERIZATION    . CATARACT EXTRACTION    . CENTRAL VENOUS CATHETER INSERTION Left 12/21/2012   Procedure: INSERTION CENTRAL LINE ADULT;  Surgeon: Sherren Mocha, MD;  Location: [place];  Service: Open Heart Surgery;  Laterality: Left;  . ELBOW SURGERY     bilat   . INTRAOPERATIVE TRANSESOPHAGEAL ECHOCARDIOGRAM N/A 12/21/2012   Procedure: INTRAOPERATIVE TRANSESOPHAGEAL ECHOCARDIOGRAM;  Surgeon: Sherren Mocha, MD;  Location: [facility] OR;  Service: Open Heart Surgery;  Laterality: N/A;  . KNEE SURGERY Left   . LEFT AND RIGHT HEART CATHETERIZATION WITH CORONARY ANGIOGRAM N/A 10/18/2012   Procedure: LEFT AND RIGHT HEART CATHETERIZATION WITH CORONARY ANGIOGRAM;  Surgeon: Sherren Mocha, MD;  Location: [facility] CATH LAB;  Service: Cardiovascular;  Laterality: N/A;  . NASAL SEPTUM SURGERY    . NOSE SURGERY     X 2  . ORIF PATELLA Right 03/08/2015   Procedure:  OPEN REDUCTION INTERNAL FIXATION RIGHT  PATELLA TENDON AVULSION;  Surgeon: Paralee Cancel, MD;  Location: [facility] ORS;  Service: Orthopedics;  Laterality: Right;  . PACEMAKER INSERTION     Medtronic  . PATELLAR TENDON REPAIR Right 06/25/2015   Procedure: RIGHT PATELLA TENDON REVISION/REPAIR;  Surgeon: Paralee Cancel, MD;  Location: [facility] ORS;  Service: Orthopedics;  Laterality: Right;  . SHOULDER SURGERY     bilat   . THYROIDECTOMY, PARTIAL    . TONSILLECTOMY    . TOTAL KNEE ARTHROPLASTY Right 12/04/2014   Procedure: RIGHT TOTAL  KNEE ARTHROPLASTY;  Surgeon: Paralee Cancel, MD;  Location: [facility] ORS;  Service: Orthopedics;  Laterality: Right;  . TRANSCATHETER AORTIC VALVE REPLACEMENT, TRANSFEMORAL  12/21/2012   a. 53m Edwards Sapien XT transcatheter heart valve placed via open left transfemoral approach b. Intra-op TEE: well-seated bioprosthetic aortic valve with mean gradient 2 mmHg, trivial AI, mild MR, EF 30-35%  . TRANSCATHETER AORTIC VALVE REPLACEMENT, TRANSFEMORAL N/A 12/21/2012   Procedure: TRANSCATHETER  AORTIC VALVE REPLACEMENT, TRANSFEMORAL;  Surgeon: MSherren Mocha MD;  [place]  Service: Open Heart Surgery;  Laterality: N/A;    Family History  Problem Relation Age of Onset  . Ovarian cancer Mother     Deceased  . Epilepsy Father     Deceased   Social History:  reports that she has never smoked. She has never used smokeless tobacco. She reports that she does not drink alcohol or use drugs.  Allergies:  Allergies  Allergen Reactions  . Nubain [Nalbuphine Hcl] Hives    Went into cardiac arrest   . Codeine Hives and Nausea Only  . Darvocet [Propoxyphene N-Acetaminophen] Nausea Only    OUTPATIENT MEDICATIONS: Current Facility-Administered Medications on File Prior to Encounter  Medication Dose Route Frequency Provider Last Rate Last Dose  . sodium bicarbonate first hour bolus via infusion   Intravenous Once Jani Gravel, MD       Current Outpatient Prescriptions on File Prior to Encounter  Medication Sig Dispense Refill  . alendronate (FOSAMAX) 70 MG/75ML solution Take 70 mg by mouth every 7 (seven) days. Take with a full glass of water on an empty stomach on Saturday    . amiodarone (PACERONE) 100 MG tablet Take 100 mg by mouth daily.    Marland Kitchen atorvastatin (LIPITOR) 10 MG tablet Take 10 mg by mouth daily at 6 PM.     . BIOTIN 5000 PO Take 5,000 mcg by mouth every evening.     . bisacodyl (DULCOLAX) 10 MG suppository Place 10 mg rectally daily as needed for moderate constipation.    . calcitRIOL (ROCALTROL) 0.25 MCG capsule Take 0.25 mcg by mouth every morning.     . carvedilol (COREG) 12.5 MG tablet Take 1 tablet by mouth 2 (two) times daily.   3  . cetirizine (ZYRTEC) 10 MG tablet Take 10 mg by mouth daily.     . Cholecalciferol (VITAMIN D-3) 1000 units CAPS Take 2 capsules by mouth. Take 2 capsules to = 2000 units PO QHS    . cycloSPORINE (RESTASIS) 0.05 % ophthalmic emulsion Place 1 drop into both eyes 2 (two) times daily.     Marland Kitchen donepezil (ARICEPT) 10 MG tablet Take 10 mg  by mouth at bedtime.     . DULoxetine (CYMBALTA) 60 MG capsule Take 60 mg by mouth every evening.     Marland Kitchen esomeprazole (NEXIUM) 40 MG capsule Take 40 mg by mouth daily.     . ferrous sulfate 325 (65 FE) MG tablet Take 325 mg by mouth daily.     . hydrALAZINE (APRESOLINE) 25 MG tablet Take 25 mg by mouth 3 (three) times daily.    Marland Kitchen HYDROcodone-acetaminophen (NORCO) 7.5-325 MG tablet Take 1-2 tablets by mouth every 4 (four) hours as needed for moderate pain.    Marland Kitchen levothyroxine (SYNTHROID, LEVOTHROID) 112 MCG tablet Take 112 mcg by mouth daily.     . Linaclotide (LINZESS) 145 MCG CAPS capsule Take 145 mcg by mouth daily.     . memantine (NAMENDA) 5 MG tablet Take 5 mg by mouth 2 (two) times daily.     . Menthol (ICY HOT ADVANCED RELIEF) 7.5 % PTCH Apply 3 patches topically daily. Apply 1 patch to each knee and one patch across lower back.  Apply at 10AM and remove at 10PM    . Menthol, Topical Analgesic, (BIOFREEZE) 4 % GEL Apply 1 application topically 2 (two) times daily. Apply to back of neck, lower back, and right leg    . Multiple Vitamin (MULTIVITAMIN) tablet Take 1 tablet by mouth daily.     . nitroGLYCERIN (NITROSTAT) 0.4 MG SL tablet Place 0.4 mg under the tongue every 5 (five) minutes as needed for chest pain (MAX 3 TABLETS).     . polyethylene glycol (MIRALAX / GLYCOLAX) packet Take 17 g by mouth 2 (two) times daily. 14 each 0  . pramoxine (SARNA SENSITIVE) 1 % LOTN Apply 1 application topically daily as needed (itching).     Marland Kitchen  pregabalin (LYRICA) 50 MG capsule Take 50 mg by mouth 3 (three) times daily. 6AM, 2PM, 10PM    . senna (SENOKOT) 8.6 MG TABS tablet Take 2 tablets by mouth 2 (two) times daily.    . simethicone (MYLICON) 80 MG chewable tablet Chew 80 mg by mouth every 12 (twelve) hours as needed for flatulence.     . torsemide (DEMADEX) 20 MG tablet Take 100 mg by mouth daily.     . Turmeric 500 MG CAPS Take 1 capsule by mouth 2 (two) times daily.    Marland Kitchen ULORIC 40 MG tablet Take 40 mg  by mouth every evening.     . vitamin B-12 (CYANOCOBALAMIN) 1000 MCG tablet Take 1,000 mcg by mouth 2 (two) times a week. Tuesday and Friday    . warfarin (COUMADIN) 3 MG tablet Take 1.5 mg by mouth daily. Take 1/2 of a 3 mg tablet to = 1.5 mg qd     CURRENT MEDICATIONS: Scheduled Meds: . amiodarone  100 mg Oral Daily  . atorvastatin  10 mg Oral q1800  . calcitRIOL  0.25 mcg Oral q morning - 10a  . carvedilol  12.5 mg Oral BID  . Chlorhexidine Gluconate Cloth  6 each Topical Q0600  . cycloSPORINE  1 drop Both Eyes BID  . donepezil  10 mg Oral QHS  . DULoxetine  60 mg Oral QPM  . febuxostat  40 mg Oral QPM  . furosemide  80 mg Intravenous Q12H  . hydrALAZINE  25 mg Oral TID  . insulin aspart  0-15 Units Subcutaneous TID WC  . insulin aspart  0-5 Units Subcutaneous QHS  . levothyroxine  112 mcg Oral QAC breakfast  . linaclotide  145 mcg Oral Daily  . loratadine  10 mg Oral Daily  . memantine  5 mg Oral BID  . mupirocin ointment  1 application Nasal BID  . MUSCLE RUB   Topical BID  . pantoprazole  40 mg Oral Daily  . pregabalin  50 mg Oral 3 times per day  . senna  2 tablet Oral BID  . sodium chloride flush  3 mL Intravenous Q12H  . warfarin  2.5 mg Oral ONCE-1800  . Warfarin - Pharmacist Dosing Inpatient   Does not apply q1800   Continuous Infusions: PRN Meds:.sodium chloride, acetaminophen, bisacodyl, HYDROcodone-acetaminophen, ipratropium-albuterol, ondansetron (ZOFRAN) IV, simethicone, sodium chloride flush   Results for orders placed or performed during the hospital encounter of 06/13/16 (from the past 48 hour(s))  I-stat troponin, ED     Status: None   Collection Time: 06/13/16  5:13 AM  Result Value Ref Range   Troponin i, poc 0.06 0.00 - 0.08 ng/mL   Comment 3            Comment: Due to the release kinetics of cTnI, a negative result within the first hours of the onset of symptoms does not rule out myocardial infarction with certainty. If myocardial infarction is  still suspected, repeat the test at appropriate intervals.   CBC with Differential/Platelet     Status: Abnormal   Collection Time: 06/13/16  5:21 AM  Result Value Ref Range   WBC 14.0 (H) 4.0 - 10.5 K/uL   RBC 3.88 3.87 - 5.11 MIL/uL   Hemoglobin 11.8 (L) 12.0 - 15.0 g/dL   HCT 36.9 36.0 - 46.0 %   MCV 95.1 78.0 - 100.0 fL   MCH 30.4 26.0 - 34.0 pg   MCHC 32.0 30.0 - 36.0 g/dL   RDW 16.0 (  H) 11.5 - 15.5 %   Platelets 220 150 - 400 K/uL   Neutrophils Relative % 83 %   Neutro Abs 11.6 (H) 1.7 - 7.7 K/uL   Lymphocytes Relative 9 %   Lymphs Abs 1.3 0.7 - 4.0 K/uL   Monocytes Relative 7 %   Monocytes Absolute 1.0 0.1 - 1.0 K/uL   Eosinophils Relative 1 %   Eosinophils Absolute 0.1 0.0 - 0.7 K/uL   Basophils Relative 0 %   Basophils Absolute 0.0 0.0 - 0.1 K/uL  Comprehensive metabolic panel     Status: Abnormal   Collection Time: 06/13/16  5:21 AM  Result Value Ref Range   Sodium 143 135 - 145 mmol/L   Potassium 3.8 3.5 - 5.1 mmol/L   Chloride 108 101 - 111 mmol/L   CO2 22 22 - 32 mmol/L   Glucose, Bld 166 (H) 65 - 99 mg/dL   BUN 27 (H) 6 - 20 mg/dL   Creatinine, Ser 1.85 (H) 0.44 - 1.00 mg/dL   Calcium 9.2 8.9 - 10.3 mg/dL   Total Protein 5.9 (L) 6.5 - 8.1 g/dL   Albumin 3.1 (L) 3.5 - 5.0 g/dL   AST 26 15 - 41 U/L   ALT 14 14 - 54 U/L   Alkaline Phosphatase 59 38 - 126 U/L   Total Bilirubin 0.8 0.3 - 1.2 mg/dL   GFR calc non Af Amer 24 (L) >60 mL/min   GFR calc Af Amer 28 (L) >60 mL/min    Comment: (NOTE) The eGFR has been calculated using the CKD EPI equation. This calculation has not been validated in all clinical situations. eGFR's persistently <60 mL/min signify possible Chronic Kidney Disease.    Anion gap 13 5 - 15  Brain natriuretic peptide     Status: Abnormal   Collection Time: 06/13/16  5:30 AM  Result Value Ref Range   B Natriuretic Peptide 528.3 (H) 0.0 - 100.0 pg/mL  Protime-INR     Status: Abnormal   Collection Time: 06/13/16  5:30 AM  Result Value  Ref Range   Prothrombin Time 19.9 (H) 11.4 - 15.2 seconds   INR 1.68   MRSA PCR Screening     Status: Abnormal   Collection Time: 06/13/16 12:21 PM  Result Value Ref Range   MRSA by PCR POSITIVE (A) NEGATIVE    Comment:        The GeneXpert MRSA Assay (FDA approved for NASAL specimens only), is one component of a comprehensive MRSA colonization surveillance program. It is not intended to diagnose MRSA infection nor to guide or monitor treatment for MRSA infections. RESULT CALLED TO, READ BACK BY AND VERIFIED WITH: Candie Chroman RN 15:05 06/13/16 ([email or web address])   Glucose, capillary     Status: Abnormal   Collection Time: 06/13/16  5:05 PM  Result Value Ref Range   Glucose-Capillary 103 (H) 65 - 99 mg/dL  Glucose, capillary     Status: Abnormal   Collection Time: 06/13/16 10:37 PM  Result Value Ref Range   Glucose-Capillary 103 (H) 65 - 99 mg/dL  Basic metabolic panel     Status: Abnormal   Collection Time: 06/14/16  2:44 AM  Result Value Ref Range   Sodium 141 135 - 145 mmol/L   Potassium 5.1 3.5 - 5.1 mmol/L    Comment: DELTA CHECK NOTED   Chloride 102 101 - 111 mmol/L   CO2 31 22 - 32 mmol/L   Glucose, Bld 98 65 - 99 mg/dL   BUN 30 (  H) 6 - 20 mg/dL   Creatinine, Ser 1.97 (H) 0.44 - 1.00 mg/dL   Calcium 9.0 8.9 - 10.3 mg/dL   GFR calc non Af Amer 22 (L) >60 mL/min   GFR calc Af Amer 26 (L) >60 mL/min    Comment: (NOTE) The eGFR has been calculated using the CKD EPI equation. This calculation has not been validated in all clinical situations. eGFR's persistently <60 mL/min signify possible Chronic Kidney Disease.    Anion gap 8 5 - 15  Protime-INR     Status: Abnormal   Collection Time: 06/14/16  2:44 AM  Result Value Ref Range   Prothrombin Time 20.9 (H) 11.4 - 15.2 seconds   INR 1.77   Glucose, capillary     Status: None   Collection Time: 06/14/16  8:39 AM  Result Value Ref Range   Glucose-Capillary 91 65 - 99 mg/dL   Dg Chest Port 1 View  Result Date:  06/13/2016 CLINICAL DATA:  83 year old female with shortness of breath. EXAM: PORTABLE CHEST 1 VIEW COMPARISON:  Chest CT dated 12/14/2014 under radiograph dated 11/29/2014 FINDINGS: There is stable cardiomegaly. An aortic valve stent is noted. Central vascular prominence and diffuse interstitial densities noted likely mild vascular congestion and edema. Bilateral lower lung field hazy densities may represent atelectasis versus infiltrate. There is blunting of the left costophrenic angle which may represent a small pleural effusion. There is no pneumothorax. Right pectoral pacemaker device noted. No acute osseous pathology. IMPRESSION: 1. Cardiomegaly with probable mild congestive changes and interstitial edema. 2. Bibasilar atelectasis versus infiltrate. 3. Probable small left pleural effusion. Electronically Signed   By: Anner Crete M.D.   On: 06/13/2016 05:18    ROS: General:no colds or fevers, no weight changes Skin:no rashes or ulcers HEENT:no blurred vision, no congestion CV:see HPI PUL:see HPI GI:no diarrhea constipation or melena, no indigestion GU:no hematuria, no dysuria MS:no joint pain, no claudication Neuro:no syncope, no lightheadedness Endo:+ diabetes, no thyroid disease  Blood pressure (!) 151/59, pulse 64, temperature 98.6 F (37 C), temperature source Oral, resp. rate (!) 21, height _0  (1.448 m), weight 175 lb 7.8 oz (79.6 kg), SpO2 99 %.  Wt Readings from Last 3 Encounters:  06/14/16 175 lb 7.8 oz (79.6 kg)  05/21/16 195 lb (88.5 kg)  04/07/16 195 lb (88.5 kg)    PE: General:Pleasant affect, NAD Skin:Warm and dry, brisk capillary refill HEENT:normocephalic, sclera clear, mucus membranes moist Neck:supple, no JVD, no bruits  Heart:S1S2 RRR without murmur, [place], rub or click Lungs:clear with rales in bases, rhonchi, + wheezes ZOX:WRUE, non tender, + BS, do not palpate liver spleen or masses Ext:1+ lower ext edema, 2+ pedal pulses, 2+ radial pulses Neuro:alert  and oriented X 3, MAE, follows commands, + facial symmetry    Assessment/Plan Principal Problem:   Acute respiratory failure (HCC) Active Problems:   Paroxysmal atrial fibrillation (HCC)   Hypertension   Acute on chronic systolic and diastolic heart failure, NYHA class 3 (HCC)   Dementia without behavioral disturbance   CKD (chronic kidney disease) stage 4, GFR 15-29 ml/min (HCC)   Elevated blood sugar  Acute respiratory failure  Improved, on lasix 80 mg every 12 hours.  Is diuresing. Continue lasix.  Dr. Radford Pax to see.  Chest pain may be due to volume overload.  Non obstructive disease in 2014.     PAF maintaining SR CHA2DS2VASc score of 8, is on coumadin INR 1.77  Pharmacy following  Hx of NICM now returned with EF 35-40%.  With wall motion abnormality plan for nuc study with elevated Cr.   Hx of TAVR 2014.    CKD Kittson  Nurse Practitioner Certified [place] Pager [phone] or after 5pm or weekends call [phone] 06/14/2016, 11:28 AM

## 2016-06-15 ENCOUNTER — Inpatient Hospital Stay (HOSPITAL_COMMUNITY): Payer: Medicare Other

## 2016-06-15 DIAGNOSIS — R079 Chest pain, unspecified: Secondary | ICD-10-CM

## 2016-06-15 DIAGNOSIS — E039 Hypothyroidism, unspecified: Secondary | ICD-10-CM

## 2016-06-15 LAB — GLUCOSE, CAPILLARY
GLUCOSE-CAPILLARY: 84 mg/dL (ref 65–99)
GLUCOSE-CAPILLARY: 124 mg/dL — AB (ref 65–99)
GLUCOSE-CAPILLARY: 110 mg/dL — AB (ref 65–99)
GLUCOSE-CAPILLARY: 126 mg/dL — AB (ref 65–99)

## 2016-06-15 LAB — BASIC METABOLIC PANEL
Anion gap: 12 (ref 5–15)
BUN: 39 mg/dL — ABNORMAL HIGH (ref 6–20)
CHLORIDE: 100 mmol/L — AB (ref 101–111)
CO2: 30 mmol/L (ref 22–32)
CREATININE: 2.2 mg/dL — AB (ref 0.44–1.00)
Calcium: 9.1 mg/dL (ref 8.9–10.3)
GFR calc non Af Amer: 20 mL/min — ABNORMAL LOW (ref >60)
GFR, EST AFRICAN AMERICAN: 23 mL/min — AB (ref >60)
Glucose, Bld: 79 mg/dL (ref 65–99)
POTASSIUM: 4.5 mmol/L (ref 3.5–5.1)
Sodium: 142 mmol/L (ref 135–145)

## 2016-06-15 LAB — HEMOGLOBIN A1C
HEMOGLOBIN A1C: 5 % (ref 4.8–5.6)
MEAN PLASMA GLUCOSE: 97 mg/dL

## 2016-06-15 LAB — PROTIME-INR
INR: 1.65
Prothrombin Time: 19.7 seconds — ABNORMAL HIGH (ref 11.4–15.2)

## 2016-06-15 LAB — TSH: TSH: 2.673 u[IU]/mL (ref 0.350–4.500)

## 2016-06-15 LAB — T4, FREE: FREE T4: 1.57 ng/dL — AB (ref 0.61–1.12)

## 2016-06-15 MED ORDER — PREDNISONE 20 MG PO TABS
40.0000 mg | ORAL_TABLET | Freq: Two times a day (BID) | ORAL | Status: DC
  Administered 2016-06-15 – 2016-06-17 (×4): 40 mg via ORAL
  Filled 2016-06-15 (×4): qty 2

## 2016-06-15 MED ORDER — LEVALBUTEROL HCL 0.63 MG/3ML IN NEBU
INHALATION_SOLUTION | RESPIRATORY_TRACT | Status: AC
  Administered 2016-06-15: 0.63 mg via RESPIRATORY_TRACT
  Filled 2016-06-15: qty 3

## 2016-06-15 MED ORDER — REGADENOSON 0.4 MG/5ML IV SOLN
0.4000 mg | Freq: Once | INTRAVENOUS | Status: AC
  Administered 2016-06-15: 0.4 mg via INTRAVENOUS
  Filled 2016-06-15: qty 5

## 2016-06-15 MED ORDER — TECHNETIUM TC 99M TETROFOSMIN IV KIT
30.0000 | PACK | Freq: Once | INTRAVENOUS | Status: AC | PRN
  Administered 2016-06-15: 30 via INTRAVENOUS

## 2016-06-15 MED ORDER — AMINOPHYLLINE 25 MG/ML IV (NUC MED)
75.0000 mg | Freq: Once | INTRAVENOUS | Status: AC
  Administered 2016-06-15: 75 mg via INTRAVENOUS

## 2016-06-15 MED ORDER — LEVOTHYROXINE SODIUM 75 MCG PO TABS
75.0000 ug | ORAL_TABLET | Freq: Every day | ORAL | Status: DC
  Administered 2016-06-16 – 2016-06-27 (×12): 75 ug via ORAL
  Filled 2016-06-15 (×12): qty 1

## 2016-06-15 MED ORDER — TECHNETIUM TC 99M TETROFOSMIN IV KIT
10.0000 | PACK | Freq: Once | INTRAVENOUS | Status: AC | PRN
  Administered 2016-06-15: 10 via INTRAVENOUS

## 2016-06-15 MED ORDER — REGADENOSON 0.4 MG/5ML IV SOLN
INTRAVENOUS | Status: AC
Start: 2016-06-15 — End: 2016-06-15
  Administered 2016-06-15: 0.4 mg via INTRAVENOUS
  Filled 2016-06-15: qty 5

## 2016-06-15 MED ORDER — LEVALBUTEROL HCL 0.63 MG/3ML IN NEBU
0.6300 mg | INHALATION_SOLUTION | Freq: Three times a day (TID) | RESPIRATORY_TRACT | Status: DC
Start: 2016-06-15 — End: 2016-06-16
  Administered 2016-06-15 (×2): 0.63 mg via RESPIRATORY_TRACT
  Filled 2016-06-15: qty 3

## 2016-06-15 MED ORDER — AMINOPHYLLINE 25 MG/ML IV SOLN
INTRAVENOUS | Status: AC
  Administered 2016-06-15: 75 mg via INTRAVENOUS
  Filled 2016-06-15: qty 10

## 2016-06-15 NOTE — Progress Notes (Signed)
Progress Note  Patient Name: [name] Date of Encounter: 06/15/2016  Primary Cardiologist: Dr. Excell Seltzer EP Dr. Ladona Ridgel  Subjective   Not much chest pain vague discomfort , breathing stable.  Inpatient Medications    Scheduled Meds: . regadenoson      . amiodarone  100 mg Oral Daily  . atorvastatin  10 mg Oral q1800  . calcitRIOL  0.25 mcg Oral q morning - 10a  . carvedilol  12.5 mg Oral BID  . Chlorhexidine Gluconate Cloth  6 each Topical Q0600  . cycloSPORINE  1 drop Both Eyes BID  . donepezil  10 mg Oral QHS  . DULoxetine  60 mg Oral QPM  . febuxostat  40 mg Oral QPM  . heparin  5,000 Units Subcutaneous Q8H  . hydrALAZINE  25 mg Oral TID  . insulin aspart  0-15 Units Subcutaneous TID WC  . insulin aspart  0-5 Units Subcutaneous QHS  . levothyroxine  112 mcg Oral QAC breakfast  . linaclotide  145 mcg Oral Daily  . loratadine  10 mg Oral Daily  . mouth rinse  15 mL Mouth Rinse BID  . memantine  5 mg Oral BID  . mupirocin ointment  1 application Nasal BID  . MUSCLE RUB   Topical BID  . pantoprazole  40 mg Oral Daily  . pregabalin  50 mg Oral 3 times per day  . regadenoson  0.4 mg Intravenous Once  . senna  2 tablet Oral BID  . sodium chloride flush  3 mL Intravenous Q12H  . Warfarin - Pharmacist Dosing Inpatient   Does not apply q1800   Continuous Infusions:  PRN Meds: sodium chloride, acetaminophen, bisacodyl, HYDROcodone-acetaminophen, ipratropium-albuterol, ondansetron (ZOFRAN) IV, simethicone, sodium chloride flush   Vital Signs    Vitals:   06/14/16 2010 06/15/16 0111 06/15/16 0542 06/15/16 0906  BP:  (!) 122/45 (!) 159/57 (!) 190/80  Pulse:  63 60 60  Resp: 18 16 18    Temp: 98.5 F (36.9 C) 97.7 F (36.5 C) 98.3 F (36.8 C)   TempSrc: Oral Oral Oral   SpO2:  96% 97%   Weight:   177 lb 3.2 oz (80.4 kg)   Height:        Intake/Output Summary (Last 24 hours) at 06/15/16 0916 Last data filed at 06/15/16 0746  Gross per 24 hour  Intake               360 ml  Output             1347 ml  Net             -987 ml   Filed Weights   06/14/16 0530 06/14/16 1500 06/15/16 0542  Weight: 175 lb 7.8 oz (79.6 kg) 178 lb 4.8 oz (80.9 kg) 177 lb 3.2 oz (80.4 kg)    Telemetry    A pacing - Personally Reviewed  ECG    No new - Personally Reviewed  Physical Exam   GEN: No acute distress.   Neck: No JVD Cardiac: RRR, no murmurs, rubs, or gallops.  Respiratory: + wheezes to auscultation bilaterally. No rales GI: Soft, nontender, non-distended  MS: No edema; No deformity. Neuro:  Nonfocal  Psych: Normal affect   Labs    Chemistry Recent Labs Lab 06/13/16 0521 06/14/16 0244 06/14/16 1444 06/15/16 0459  NA 143 141  --  142  K 3.8 5.1  --  4.5  CL 108 102  --  100*  CO2 22  31  --  30  GLUCOSE 166* 98  --  79  BUN 27* 30*  --  39*  CREATININE 1.85* 1.97* 2.03* 2.20*  CALCIUM 9.2 9.0  --  9.1  PROT 5.9*  --   --   --   ALBUMIN 3.1*  --   --   --   AST 26  --   --   --   ALT 14  --   --   --   ALKPHOS 59  --   --   --   BILITOT 0.8  --   --   --   GFRNONAA 24* 22* 22* 20*  GFRAA 28* 26* 25* 23*  ANIONGAP 13 8  --  12     Hematology Recent Labs Lab 06/13/16 0521 06/14/16 1444  WBC 14.0* 8.9  RBC 3.88 3.64*  HGB 11.8* 11.2*  HCT 36.9 35.1*  MCV 95.1 96.4  MCH 30.4 30.8  MCHC 32.0 31.9  RDW 16.0* 15.8*  PLT 220 169    Cardiac EnzymesNo results for input(s): TROPONINI in the last 168 hours.  Recent Labs Lab 06/13/16 0513  TROPIPOC 0.06     BNP Recent Labs Lab 06/13/16 0530  BNP 528.3*     DDimer No results for input(s): DDIMER in the last 168 hours.   Radiology    No results found.  Cardiac Studies   nuc study completed results pending  TTE Study Conclusions  - Left ventricle: LVEF is approximately 35 to 40% with hypokinesis   of inferior, inferoseptal and anterior walls This is new compared   to echo of 2016. The cavity size was moderately dilated. Wall   thickness was normal.  Features are consistent with a pseudonormal   left ventricular filling pattern, with concomitant abnormal   relaxation and increased filling pressure (grade 2 diastolic   dysfunction). Doppler parameters are consistent with high   ventricular filling pressure. - Aortic valve: AV prosthesis is difficult to see Peak and mean   gradients through the valve are 12 and 7 mm Hg respectively.   There is trace valvular AI Valve area (VTI): 1.82 cm^2. Valve   area (Vmax): 1.7 cm^2. Valve area (Vmean): 1.89 cm^2. - Mitral valve: There was mild to moderate regurgitation. - Left atrium: The atrium was mildly dilated. - Pulmonary arteries: PA peak pressure: 39 mm Hg (S).   Patient Profile     83 y.o. female with hx of cardiomyopathy, chronic systolic heart failure, and severe aortic stenosis. She underwent TAVR 12/21/2012 with a 29 mm Edwards Sapien XT valve. Other cardiac related problems include atrial fibrillation and hypertension. The patient has been chronically anticoagulated with warfarin. The patient has undergone pacemaker placement and she is followed by Dr. Ladona Ridgel.  Last seen by Dr. Excell Seltzer 01/2014.   Other hx includes, dementia, chronic pain, lives at [facility].   Now with Heart failure.  Assessment & Plan    Acute respiratory failure  Improved, on lasix 80 mg every 12 hours- now held.  Is diuresing.   Dr. Mayford Knife to see.  I&O neg 2504 and wt down to 177 from 185.    Chest pain may be due to volume overload.  Non obstructive disease in 2014.   for nuc study today.    PAF maintaining SR CHA2DS2VASc score of 8, is on coumadin INR 1.77  Pharmacy following  Currently on hold  Hx of NICM now returned with EF 35-40%. With wall motion abnormality plan for nuc study  with elevated Cr.   Hx of TAVR 2014.  see Echo  CKD 4  Cr increased 1.97-->  2.03 to 2.20 today lasix on hold  Signed, Nada Boozer, NP  06/15/2016, 9:16 AM

## 2016-06-15 NOTE — Progress Notes (Signed)
PT Cancellation Note  Patient Details Name: [name] MRN: [id number] DOB: June 13, 1932   Cancelled Treatment:    Reason Eval/Treat Not Completed: Patient at procedure or test/unavailable (OTF for cardiac stress test)   Fabio Asa 06/15/2016, 8:33 AM Charlotte Crumb, PT DPT  [phone]

## 2016-06-15 NOTE — Progress Notes (Signed)
Pharmacist Heart Failure Core Measure Documentation  Assessment: [name] has an EF documented as 35-40% on 06/13/16 by ECHO.  Rationale: Heart failure patients with left ventricular systolic dysfunction (LVSD) and an EF < 40% should be prescribed an angiotensin converting enzyme inhibitor (ACEI) or angiotensin receptor blocker (ARB) at discharge unless a contraindication is documented in the medical record.  This patient is not currently on an ACEI or ARB for HF.  This note is being placed in the record in order to provide documentation that a contraindication to the use of these agents is present for this encounter.  ACE Inhibitor or Angiotensin Receptor Blocker is contraindicated (specify all that apply)  []   ACEI allergy AND ARB allergy []   Angioedema []   Moderate or severe aortic stenosis []   Hyperkalemia []   Hypotension []   Renal artery stenosis [x]   Worsening renal function, preexisting renal disease or dysfunction  York Cerise, PharmD Pharmacy Resident  Pager [phone] 06/15/16 2:55 PM

## 2016-06-15 NOTE — Progress Notes (Signed)
lexiscan portion completed + SOB, chest pain and nausea, resolved with aminophylline.

## 2016-06-15 NOTE — Progress Notes (Signed)
Patient alert and oriented, slept during the night.Complained of chronic back pain, PRN was given and effective. Patient has been NPO after midnight. Will continue to monitor.  Zalen Sequeira, RN

## 2016-06-15 NOTE — Progress Notes (Signed)
[facility] PROGRESS NOTE  [name] QRF:[id number] DOB: 05/18/1932 DOA: 06/13/2016  PCP: Oneal Grout, MD  Brief History/Interval Summary: 83 year old Caucasian female with a past medical history of diastolic and systolic CHF with echocardiogram in 2016 showing EF of 50%, chronic kidney disease stage IV, aortic stenosis, status post TAVR, history of dementia, history of paroxysmal atrial fibrillation on Coumadin who lives in a skilled nursing facility. She was brought in due to respiratory distress. Patient was found to have acute congestive heart failure and was hospitalized for further management.  Reason for Visit: Acute systolic CHF  Consultants: Cardiology  Procedures:  Transthoracic echocardiogram Study Conclusions  - Left ventricle: LVEF is approximately 35 to 40% with hypokinesis   of inferior, inferoseptal and anterior walls This is new compared   to echo of 2016. The cavity size was moderately dilated. Wall   thickness was normal. Features are consistent with a pseudonormal   left ventricular filling pattern, with concomitant abnormal   relaxation and increased filling pressure (grade 2 diastolic   dysfunction). Doppler parameters are consistent with high   ventricular filling pressure. - Aortic valve: AV prosthesis is difficult to see Peak and mean   gradients through the valve are 12 and 7 mm Hg respectively.   There is trace valvular AI Valve area (VTI): 1.82 cm^2. Valve   area (Vmax): 1.7 cm^2. Valve area (Vmean): 1.89 cm^2. - Mitral valve: There was mild to moderate regurgitation. - Left atrium: The atrium was mildly dilated. - Pulmonary arteries: PA peak pressure: 39 mm Hg (S).  Nuclear Stress Test Pending  Antibiotics: None  Subjective/Interval History: Patient feels slightly better but still not back to her usual. Still short of breath and with wheezing at times. Some chest pressure at times. Had stress test this morning.  ROS: Denies any  nausea or vomiting  Objective:  Vital Signs  Vitals:   06/15/16 0906 06/15/16 0920 06/15/16 0922 06/15/16 0930  BP: (!) 190/80 (!) 137/56 (!) 156/57 (!) 187/82  Pulse: 60 84 82 68  Resp:      Temp:      TempSrc:      SpO2:      Weight:      Height:        Intake/Output Summary (Last 24 hours) at 06/15/16 1147 Last data filed at 06/15/16 1048  Gross per 24 hour  Intake              535 ml  Output             1347 ml  Net             -812 ml   Filed Weights   06/14/16 0530 06/14/16 1500 06/15/16 0542  Weight: 79.6 kg (175 lb 7.8 oz) 80.9 kg (178 lb 4.8 oz) 80.4 kg (177 lb 3.2 oz)    General appearance: alert, cooperative, appears stated age and no distress Resp: Seems to have improved air entry with less crackles. Occasional wheezing heard. No rhonchi. Cardio: regular rate and rhythm, S1, S2 normal, no murmur, click, rub or gallop GI: soft, non-tender; bowel sounds normal; no masses,  no organomegaly Extremities: Minimal edema bilateral lower extremity Neurologic: Awake and alert. Oriented 3. No focal neurological deficits.  Lab Results:  Data Reviewed: I have personally reviewed following labs and imaging studies  CBC:  Recent Labs Lab 06/13/16 0521 06/14/16 1444  WBC 14.0* 8.9  NEUTROABS 11.6*  --   HGB 11.8* 11.2*  HCT 36.9  35.1*  MCV 95.1 96.4  PLT 220 169    Basic Metabolic Panel:  Recent Labs Lab 06/13/16 0521 06/14/16 0244 06/14/16 1444 06/15/16 0459  NA 143 141  --  142  K 3.8 5.1  --  4.5  CL 108 102  --  100*  CO2 22 31  --  30  GLUCOSE 166* 98  --  79  BUN 27* 30*  --  39*  CREATININE 1.85* 1.97* 2.03* 2.20*  CALCIUM 9.2 9.0  --  9.1    GFR: Estimated Creatinine Clearance: 17.2 mL/min (by C-G formula based on SCr of 2.2 mg/dL (H)).  Liver Function Tests:  Recent Labs Lab 06/13/16 0521  AST 26  ALT 14  ALKPHOS 59  BILITOT 0.8  PROT 5.9*  ALBUMIN 3.1*    Coagulation Profile:  Recent Labs Lab 06/13/16 0530  06/14/16 0244 06/15/16 0459  INR 1.68 1.77 1.65    CBG:  Recent Labs Lab 06/14/16 0839 06/14/16 1233 06/14/16 1638 06/14/16 2212 06/15/16 0726  GLUCAP 91 95 258* 79 84     Recent Results (from the past 240 hour(s))  MRSA PCR Screening     Status: Abnormal   Collection Time: 06/13/16 12:21 PM  Result Value Ref Range Status   MRSA by PCR POSITIVE (A) NEGATIVE Final    Comment:        The GeneXpert MRSA Assay (FDA approved for NASAL specimens only), is one component of a comprehensive MRSA colonization surveillance program. It is not intended to diagnose MRSA infection nor to guide or monitor treatment for MRSA infections. RESULT CALLED TO, READ BACK BY AND VERIFIED WITH: Tyler Aas RN 15:05 06/13/16 ([email or web address])       Radiology Studies: No results found.   Medications:  Scheduled: . amiodarone  100 mg Oral Daily  . atorvastatin  10 mg Oral q1800  . calcitRIOL  0.25 mcg Oral q morning - 10a  . carvedilol  12.5 mg Oral BID  . Chlorhexidine Gluconate Cloth  6 each Topical Q0600  . cycloSPORINE  1 drop Both Eyes BID  . donepezil  10 mg Oral QHS  . DULoxetine  60 mg Oral QPM  . febuxostat  40 mg Oral QPM  . heparin  5,000 Units Subcutaneous Q8H  . hydrALAZINE  25 mg Oral TID  . insulin aspart  0-15 Units Subcutaneous TID WC  . insulin aspart  0-5 Units Subcutaneous QHS  . levothyroxine  112 mcg Oral QAC breakfast  . linaclotide  145 mcg Oral Daily  . loratadine  10 mg Oral Daily  . mouth rinse  15 mL Mouth Rinse BID  . memantine  5 mg Oral BID  . mupirocin ointment  1 application Nasal BID  . MUSCLE RUB   Topical BID  . pantoprazole  40 mg Oral Daily  . pregabalin  50 mg Oral 3 times per day  . senna  2 tablet Oral BID  . sodium chloride flush  3 mL Intravenous Q12H  . Warfarin - Pharmacist Dosing Inpatient   Does not apply q1800   Continuous:  WJX:BJYNWG chloride, acetaminophen, bisacodyl, HYDROcodone-acetaminophen, ipratropium-albuterol, ondansetron  (ZOFRAN) IV, simethicone, sodium chloride flush  Assessment/Plan:  Principal Problem:   Acute respiratory failure (HCC) Active Problems:   Paroxysmal atrial fibrillation (HCC)   Hypertension   Acute on chronic systolic and diastolic heart failure, NYHA class 3 (HCC)   Dementia without behavioral disturbance   CKD (chronic kidney disease) stage 4, GFR 15-29 ml/min (HCC)  Elevated blood sugar   Acute on chronic combined systolic and diastolic congestive heart failure (HCC)   Unstable angina (HCC)    Acute respiratory failure with hypoxia Patient initially required BiPAP. She stabilized after she was diuresed. Currently on oxygen by nasal cannula. Much improved.  Acute systolic CHF Echocardiogram shows that her EF is 35-40%. This is less than what it was in 2016. Wall motion abnormalities also noted. Patient was placed on IV lasix. Cardiology was consulted. Underwent stress test today. With rising creatinine lasix was held by cards. Will get repeat CXR today. Has diuresed but weight has not changed much. If CXR continues to show volume overload will involve nephrology to assist with management. Patient is on carvedilol. No ACE inhibitor due to renal failure. Monitor ins and outs. Daily weights.   Chronic kidney disease, stage IV. Renal function is close to baseline. However rise in creatinine noted. Monitor urine output. Avoid nephrotoxic agents. See above. May have to involve Nephrology. She follows with Dr. Allena Katz at [facility].  History of aortic stenosis, status post TAVR Prosthetic valve seen on echocardiogram. Gradients could not be measured. Further management per cardiology.  History of paroxysmal atrial fibrillation Chads2vascular score is 6. Patient is on warfarin, which will be continued. Patient is also on amiodarone as well as beta blocker, which will also be continued.  History of essential hypertension. Monitor blood pressures. Continue home medications.  History of  dementia. Continue Namenda and Aricept. Mental status appears to be close to baseline. No neurological deficits.  History of hypothyroidism. TSh is normal but FT4 is high. Will cut back on dose of levothyroxine to from .  DVT Prophylaxis: On warfarin    Code Status: Full code  Family Communication: Discussed with the patient. No family at bedside  Disposition Plan: Management as above.    LOS: 2 days   [facility]  [facility] Pager [phone] 06/15/2016, 11:47 AM  If 7PM-7AM, please contact night-coverage at [email or web address], password [facility]

## 2016-06-15 NOTE — Progress Notes (Signed)
ANTICOAGULATION CONSULT NOTE - Follow Up Consult  Pharmacy Consult for warfarin Indication: atrial fibrillation  Allergies  Allergen Reactions  . Nubain [Nalbuphine Hcl] Hives    Went into cardiac arrest   . Codeine Hives and Nausea Only  . Darvocet [Propoxyphene N-Acetaminophen] Nausea Only    Patient Measurements: Height: 4\' 9"  (144.8 cm) Weight: 177 lb 3.2 oz (80.4 kg) (Scale C) IBW/kg (Calculated) : 38.6  Vital Signs: Temp: 98.1 F (36.7 C) (02/25 1252) Temp Source: Oral (02/25 1252) BP: 113/42 (02/25 1252) Pulse Rate: 59 (02/25 1252)  Labs:  Recent Labs  06/13/16 0521 06/13/16 0530 06/14/16 0244 06/14/16 1444 06/15/16 0459  HGB 11.8*  --   --  11.2*  --   HCT 36.9  --   --  35.1*  --   PLT 220  --   --  169  --   LABPROT  --  19.9* 20.9*  --  19.7*  INR  --  1.68 1.77  --  1.65  CREATININE 1.85*  --  1.97* 2.03* 2.20*    Estimated Creatinine Clearance: 17.2 mL/min (by C-G formula based on SCr of 2.2 mg/dL (H)).  Assessment: 83 yo F on PTA warfarin for history of afib. INR is 1.65. Coumadin was placed on hold last PM pending Lexiscan today to see if cath is needed. No bleeding noted.   PTA dose: 1.5 mg daily  Goal of Therapy:  INR 2-3 Monitor platelets by anticoagulation protocol: Yes   Plan:   Hold coumadin pending lexiscan result  Ulyses Southward, PharmD, BCPS, AAHIVP, CPP Infectious Disease Pharmacist Pager: [phone] 06/15/2016 2:28 PM

## 2016-06-16 DIAGNOSIS — R9439 Abnormal result of other cardiovascular function study: Secondary | ICD-10-CM

## 2016-06-16 LAB — NM MYOCAR MULTI W/SPECT W/WALL MOTION / EF
CHL CUP RESTING HR STRESS: 64 {beats}/min
CHL RATE OF PERCEIVED EXERTION: 0
CSEPEDS: 0 s
CSEPEW: 1 METS
Exercise duration (min): 0 min
MPHR: 138 {beats}/min
Peak HR: 86 {beats}/min
Percent HR: 62 %

## 2016-06-16 LAB — PROTIME-INR
INR: 1.31
Prothrombin Time: 16.4 seconds — ABNORMAL HIGH (ref 11.4–15.2)

## 2016-06-16 LAB — GLUCOSE, CAPILLARY
GLUCOSE-CAPILLARY: 136 mg/dL — AB (ref 65–99)
GLUCOSE-CAPILLARY: 174 mg/dL — AB (ref 65–99)
Glucose-Capillary: 108 mg/dL — ABNORMAL HIGH (ref 65–99)
Glucose-Capillary: 131 mg/dL — ABNORMAL HIGH (ref 65–99)
Glucose-Capillary: 146 mg/dL — ABNORMAL HIGH (ref 65–99)

## 2016-06-16 LAB — BASIC METABOLIC PANEL
ANION GAP: 10 (ref 5–15)
BUN: 36 mg/dL — ABNORMAL HIGH (ref 6–20)
CALCIUM: 9.4 mg/dL (ref 8.9–10.3)
CO2: 28 mmol/L (ref 22–32)
Chloride: 103 mmol/L (ref 101–111)
Creatinine, Ser: 2.05 mg/dL — ABNORMAL HIGH (ref 0.44–1.00)
GFR calc Af Amer: 25 mL/min — ABNORMAL LOW (ref >60)
GFR, EST NON AFRICAN AMERICAN: 21 mL/min — AB (ref >60)
Glucose, Bld: 138 mg/dL — ABNORMAL HIGH (ref 65–99)
POTASSIUM: 4.5 mmol/L (ref 3.5–5.1)
Sodium: 141 mmol/L (ref 135–145)

## 2016-06-16 MED ORDER — ISOSORBIDE MONONITRATE ER 30 MG PO TB24
15.0000 mg | ORAL_TABLET | Freq: Every day | ORAL | Status: DC
  Administered 2016-06-16 – 2016-06-17 (×2): 15 mg via ORAL
  Filled 2016-06-16 (×2): qty 1

## 2016-06-16 NOTE — Progress Notes (Signed)
Progress Note  Patient Name: [name] Date of Encounter: 06/16/2016  Primary Cardiologist: Dr. Excell Seltzer EP Dr. Ladona Ridgel  Subjective   Breathing, wheezing and edema have improved since admission. The patient is still having intermittent mild chest pressure lasting for 15-20 minutes at a time. She still has mild orthopnea.  Inpatient Medications    Scheduled Meds: . amiodarone  100 mg Oral Daily  . atorvastatin  10 mg Oral q1800  . calcitRIOL  0.25 mcg Oral q morning - 10a  . carvedilol  12.5 mg Oral BID  . Chlorhexidine Gluconate Cloth  6 each Topical Q0600  . cycloSPORINE  1 drop Both Eyes BID  . donepezil  10 mg Oral QHS  . DULoxetine  60 mg Oral QPM  . febuxostat  40 mg Oral QPM  . heparin  5,000 Units Subcutaneous Q8H  . hydrALAZINE  25 mg Oral TID  . insulin aspart  0-15 Units Subcutaneous TID WC  . insulin aspart  0-5 Units Subcutaneous QHS  . levothyroxine  75 mcg Oral QAC breakfast  . linaclotide  145 mcg Oral Daily  . loratadine  10 mg Oral Daily  . mouth rinse  15 mL Mouth Rinse BID  . memantine  5 mg Oral BID  . mupirocin ointment  1 application Nasal BID  . MUSCLE RUB   Topical BID  . pantoprazole  40 mg Oral Daily  . predniSONE  40 mg Oral BID WC  . pregabalin  50 mg Oral 3 times per day  . senna  2 tablet Oral BID  . sodium chloride flush  3 mL Intravenous Q12H  . Warfarin - Pharmacist Dosing Inpatient   Does not apply q1800   Continuous Infusions:  PRN Meds: sodium chloride, acetaminophen, bisacodyl, HYDROcodone-acetaminophen, ipratropium-albuterol, ondansetron (ZOFRAN) IV, simethicone, sodium chloride flush   Vital Signs    Vitals:   06/15/16 1252 06/15/16 1522 06/15/16 2130 06/16/16 0448  BP: (!) 113/42  (!) 167/85 134/64  Pulse: (!) 59  69 69  Resp: 18  20 20   Temp: 98.1 F (36.7 C)  98.4 F (36.9 C) 98.5 F (36.9 C)  TempSrc: Oral  Oral Oral  SpO2: 96% 97% 97% 95%  Weight:    176 lb 9.6 oz (80.1 kg)  Height:        Intake/Output  Summary (Last 24 hours) at 06/16/16 0937 Last data filed at 06/16/16 1610  Gross per 24 hour  Intake             1270 ml  Output              525 ml  Net              745 ml   Filed Weights   06/14/16 1500 06/15/16 0542 06/16/16 0448  Weight: 178 lb 4.8 oz (80.9 kg) 177 lb 3.2 oz (80.4 kg) 176 lb 9.6 oz (80.1 kg)    Telemetry    Sinus rhythm with occ A pacing and occ PVC's- Personally Reviewed  ECG    No new - Personally Reviewed  Physical Exam   GEN: No acute distress.   Neck: No JVD Cardiac: RRR, no murmurs, rubs, or gallops.  Respiratory: + faint wheezes to auscultation bilaterally. No rales GI: Soft, nontender, non-distended  MS: No edema; No deformity. Neuro:  Nonfocal  Psych: Normal affect   Labs    Chemistry  Recent Labs Lab 06/13/16 9604 06/14/16 0244 06/14/16 1444 06/15/16 0459 06/16/16 0441  NA  143 141  --  142 141  K 3.8 5.1  --  4.5 4.5  CL 108 102  --  100* 103  CO2 22 31  --  30 28  GLUCOSE 166* 98  --  79 138*  BUN 27* 30*  --  39* 36*  CREATININE 1.85* 1.97* 2.03* 2.20* 2.05*  CALCIUM 9.2 9.0  --  9.1 9.4  PROT 5.9*  --   --   --   --   ALBUMIN 3.1*  --   --   --   --   AST 26  --   --   --   --   ALT 14  --   --   --   --   ALKPHOS 59  --   --   --   --   BILITOT 0.8  --   --   --   --   GFRNONAA 24* 22* 22* 20* 21*  GFRAA 28* 26* 25* 23* 25*  ANIONGAP 13 8  --  12 10     Hematology  Recent Labs Lab 06/13/16 0521 06/14/16 1444  WBC 14.0* 8.9  RBC 3.88 3.64*  HGB 11.8* 11.2*  HCT 36.9 35.1*  MCV 95.1 96.4  MCH 30.4 30.8  MCHC 32.0 31.9  RDW 16.0* 15.8*  PLT 220 169    Cardiac EnzymesNo results for input(s): TROPONINI in the last 168 hours.   Recent Labs Lab 06/13/16 0513  TROPIPOC 0.06     BNP  Recent Labs Lab 06/13/16 0530  BNP 528.3*     DDimer No results for input(s): DDIMER in the last 168 hours.   Radiology    Nm Myocar Multi W/spect W/wall Motion / Ef  Result Date: 06/16/2016 CLINICAL DATA:   Chest pain. EXAM: MYOCARDIAL IMAGING WITH SPECT (REST AND PHARMACOLOGIC-STRESS) GATED LEFT VENTRICULAR WALL MOTION STUDY LEFT VENTRICULAR EJECTION FRACTION TECHNIQUE: Standard myocardial SPECT imaging was performed after resting intravenous injection of 10 mCi Tc-53m tetrofosmin. Subsequently, intravenous infusion of Lexiscan was performed under the supervision of the Cardiology staff. At peak effect of the drug, 30 mCi Tc-29m tetrofosmin was injected intravenously and standard myocardial SPECT imaging was performed. Quantitative gated imaging was also performed to evaluate left ventricular wall motion, and estimate left ventricular ejection fraction. COMPARISON:  None. FINDINGS: Perfusion: Moderate to large fixed defect involving the inferior wall and part of the apex consistent with scar. No reversible defects to suggest ischemia. Wall Motion: Wall motion abnormality involving the inferior wall and apex. Moderate left ventricular dilatation. Left Ventricular Ejection Fraction: 42 % End diastolic volume 122 ml End systolic volume 70 ml IMPRESSION: 1. Infarction/scar involving the inferior wall and part of the apex. No reversible defects to suggest ischemia. 2. Wall motion abnormality involving the inferior wall and part of the apex. Moderate left ventricular dilatation. 3. Left ventricular ejection fraction 42% 4. Non invasive risk stratification*: High risk. *2012 Appropriate Use Criteria for Coronary Revascularization Focused Update: J Am Coll Cardiol. 2012;59(9):857-881. http://content.[email or web address].aspx?articleid=[email or web address] Electronically Signed   By: Rudie Meyer M.D.   On: 06/16/2016 09:20   Dg Chest Port 1 View  Result Date: 06/15/2016 CLINICAL DATA:  Patient admitted with respiratory distress 06/13/2016. EXAM: PORTABLE CHEST 1 VIEW COMPARISON:  Single-view of the chest 06/13/2016. CT chest 12/14/2014. PA and lateral chest 11/29/2014. FINDINGS: There is cardiomegaly. The patient is status post  aortic valve repair. Pacing device is in place. Atherosclerosis is noted. Pulmonary edema seen on the most recent examination is markedly  improved. No pneumothorax or pleural effusion. IMPRESSION: Markedly improved pulmonary edema. Cardiomegaly. Atherosclerosis. Electronically Signed   By: Drusilla Kanner M.D.   On: 06/15/2016 14:15    Cardiac Studies   Nuclear stress test 06/15/16 IMPRESSION:  1. Infarction/scar involving the inferior wall and part of the apex. No reversible defects to suggest ischemia. 2. Wall motion abnormality involving the inferior wall and part of the apex. Moderate left ventricular dilatation. 3. Left ventricular ejection fraction 42% 4. Non invasive risk stratification*: High risk.  TTE Study Conclusions  - Left ventricle: LVEF is approximately 35 to 40% with hypokinesis   of inferior, inferoseptal and anterior walls This is new compared   to echo of 2016. The cavity size was moderately dilated. Wall   thickness was normal. Features are consistent with a pseudonormal   left ventricular filling pattern, with concomitant abnormal   relaxation and increased filling pressure (grade 2 diastolic   dysfunction). Doppler parameters are consistent with high   ventricular filling pressure. - Aortic valve: AV prosthesis is difficult to see Peak and mean   gradients through the valve are 12 and 7 mm Hg respectively.   There is trace valvular AI Valve area (VTI): 1.82 cm^2. Valve   area (Vmax): 1.7 cm^2. Valve area (Vmean): 1.89 cm^2. - Mitral valve: There was mild to moderate regurgitation. - Left atrium: The atrium was mildly dilated. - Pulmonary arteries: PA peak pressure: 39 mm Hg (S).   Patient Profile     83 y.o. female with hx of cardiomyopathy, chronic systolic heart failure, and severe aortic stenosis. She underwent TAVR 12/21/2012 with a 29 mm Edwards Sapien XT valve. Other cardiac related problems include atrial fibrillation and hypertension. The patient  has been chronically anticoagulated with warfarin. The patient has undergone pacemaker placement and she is followed by Dr. Ladona Ridgel.  Last seen by Dr. Excell Seltzer 01/2014.   Other hx includes, dementia, chronic pain, lives at [facility].   Now with Heart failure.  Assessment & Plan    Acute on chronic systolic and diastolic heart failure -BNP 528, 2/23 -EF 35-40%. With wall motion abnormality -CXR yesterday showed markedly improved pulmonary edema -Improved, on lasix 80 mg every 12 hours- now held due to worsening renal function.  -Was diuresingwith 2l removal since admission. Without lasix diuresis has trailed off. Weight is stable.  -Clinically looks euvolemic. Edema is resolved. Lungs without rales. DOE and orthopnea much improved since admission. -Continue coreg and hydralazine.  Chest pain  -may be due to volume overload.  Non obstructive disease in 2014.    -Nuclear stress test 06/15/16 showed Infarction/scar involving the inferior wall and part of the apex. No reversible defects to suggest ischemia. Wall motion abnormality involving the inferior wall and part of the apex. Moderate left ventricular dilatation. LVEF 42%. High risk study. -DOE is improved but still present. Has mild intermittent chest pressure.  -Would watch creatinine off diuretic (last dose of lasix 2/24 am) and consider cardiac cath if creatinine normalizes. Otherwise since symptoms are improving can treat medically for now and follow up outpatient with plan for cath in future if needed- to discuss with Dr. Tresa Endo. -Will add isosorbide which may help chest pain as well as kidney function  PAF  -maintaining SR on amiodarone and coreg -CHA2DS2VASc score of 8, is on coumadin INR 1.77  Pharmacy following.  Currently on hold in case of cardiac cath. INR today is 1.31  Hx of TAVR 2014.   -see Echo    CKD  4   -Cr increased 1.97-->  2.03 to 2.20 to 2.05 today, lasix on hold  Signed, Berton Bon, NP  06/16/2016, 9:37  AM    Patient seen and examined. Agree with assessment and plan. No recurrent chest pain. I have personally reviewed nuclear images; inferolateral scar with minimal peri-infarct ischemia EF 42%.. New wall motion abnormality noted on echo c/w findings.  Cr increased suspect contributed by over diuresis with BUN/Cr rising to 39/2.2 yesterday; today slightly better at 36/2.05.  Not an any AE-I/ARB with renal issues.  May ultimately need definitive cath but would not due presently unless renal fxn improves.  Agree with addition on nitrates;  Continue carvedilol. Hold lasix.  Will follow.    Lennette Bihari, MD, [facility] 06/16/2016 11:42 AM

## 2016-06-16 NOTE — Progress Notes (Signed)
ANTICOAGULATION CONSULT NOTE - Follow Up Consult  Pharmacy Consult for warfarin Indication: atrial fibrillation  Allergies  Allergen Reactions  . Nubain [Nalbuphine Hcl] Hives    Went into cardiac arrest   . Codeine Hives and Nausea Only  . Darvocet [Propoxyphene N-Acetaminophen] Nausea Only    Patient Measurements: Height: 4\' 9"  (144.8 cm) Weight: 176 lb 9.6 oz (80.1 kg) (c scale) IBW/kg (Calculated) : 38.6  Vital Signs: Temp: 98.5 F (36.9 C) (02/26 0448) Temp Source: Oral (02/26 0448) BP: 134/64 (02/26 0448) Pulse Rate: 69 (02/26 0448)  Labs:  Recent Labs  06/14/16 0244 06/14/16 1444 06/15/16 0459 06/16/16 0441  HGB  --  11.2*  --   --   HCT  --  35.1*  --   --   PLT  --  169  --   --   LABPROT 20.9*  --  19.7* 16.4*  INR 1.77  --  1.65 1.31  CREATININE 1.97* 2.03* 2.20* 2.05*    Estimated Creatinine Clearance: 18.4 mL/min (by C-G formula based on SCr of 2.05 mg/dL (H)).  Assessment: 83 yo F on PTA warfarin for history of afib. INR is 1.31. Coumadin was placed on hold last PM pending Lexiscan today to see if cath is needed. No bleeding noted.   PTA dose: 1.5 mg daily  Goal of Therapy:  INR 2-3 Monitor platelets by anticoagulation protocol: Yes   Plan:  Hold warfarin pending cardiac cath  Arlean Hopping. Newman Pies, PharmD, BCPS Clinical Pharmacist [phone] 06/16/2016 11:09 AM

## 2016-06-16 NOTE — Progress Notes (Signed)
[facility] PROGRESS NOTE  [name] WER:[id number] DOB: September 27, 1932 DOA: 06/13/2016  PCP: Oneal Grout, MD  Brief History/Interval Summary: 83 year old Caucasian female with a past medical history of diastolic and systolic CHF with echocardiogram in 2016 showing EF of 50%, chronic kidney disease stage IV, aortic stenosis, status post TAVR, history of dementia, history of paroxysmal atrial fibrillation on Coumadin who lives in a skilled nursing facility. She was brought in due to respiratory distress. Patient was found to have acute congestive heart failure and was hospitalized for further management.  Reason for Visit: Acute systolic CHF  Consultants: Cardiology. Nephrology.  Procedures:  Transthoracic echocardiogram Study Conclusions - Left ventricle: LVEF is approximately 35 to 40% with hypokinesis   of inferior, inferoseptal and anterior walls This is new compared   to echo of 2016. The cavity size was moderately dilated. Wall   thickness was normal. Features are consistent with a pseudonormal   left ventricular filling pattern, with concomitant abnormal   relaxation and increased filling pressure (grade 2 diastolic   dysfunction). Doppler parameters are consistent with high   ventricular filling pressure. - Aortic valve: AV prosthesis is difficult to see Peak and mean   gradients through the valve are 12 and 7 mm Hg respectively.   There is trace valvular AI Valve area (VTI): 1.82 cm^2. Valve   area (Vmax): 1.7 cm^2. Valve area (Vmean): 1.89 cm^2. - Mitral valve: There was mild to moderate regurgitation. - Left atrium: The atrium was mildly dilated. - Pulmonary arteries: PA peak pressure: 39 mm Hg (S).  Nuclear Stress Test IMPRESSION: 1. Infarction/scar involving the inferior wall and part of the apex. No reversible defects to suggest ischemia. 2. Wall motion abnormality involving the inferior wall and part of the apex. Moderate left ventricular  dilatation. 3. Left ventricular ejection fraction 42% 4. Non invasive risk stratification*: High risk.  Antibiotics: None  Subjective/Interval History: Patient states that her breathing is improved. No further wheezing. Denies any chest pain this morning. Tolerating her diet. Has noticed that she did not urinate as much yesterday as the day before.   ROS: Denies any nausea or vomiting  Objective:  Vital Signs  Vitals:   06/15/16 1252 06/15/16 1522 06/15/16 2130 06/16/16 0448  BP: (!) 113/42  (!) 167/85 134/64  Pulse: (!) 59  69 69  Resp: 18  20 20   Temp: 98.1 F (36.7 C)  98.4 F (36.9 C) 98.5 F (36.9 C)  TempSrc: Oral  Oral Oral  SpO2: 96% 97% 97% 95%  Weight:    80.1 kg (176 lb 9.6 oz)  Height:        Intake/Output Summary (Last 24 hours) at 06/16/16 0913 Last data filed at 06/16/16 1950  Gross per 24 hour  Intake             1270 ml  Output              325 ml  Net              945 ml   Filed Weights   06/14/16 1500 06/15/16 0542 06/16/16 0448  Weight: 80.9 kg (178 lb 4.8 oz) 80.4 kg (177 lb 3.2 oz) 80.1 kg (176 lb 9.6 oz)    General appearance: alert, cooperative, appears stated age and no distress Resp: Seems to have improved air entry with less crackles. No wheezing heard today. No rhonchi. Cardio: regular rate and rhythm, S1, S2 normal, no murmur, click, rub or gallop GI: soft, non-tender;  bowel sounds normal; no masses,  no organomegaly Extremities: Minimal edema bilateral lower extremity Neurologic: Awake and alert. Oriented 3. No focal neurological deficits.  Lab Results:  Data Reviewed: I have personally reviewed following labs and imaging studies  CBC:  Recent Labs Lab 06/13/16 0521 06/14/16 1444  WBC 14.0* 8.9  NEUTROABS 11.6*  --   HGB 11.8* 11.2*  HCT 36.9 35.1*  MCV 95.1 96.4  PLT 220 169    Basic Metabolic Panel:  Recent Labs Lab 06/13/16 0521 06/14/16 0244 06/14/16 1444 06/15/16 0459 06/16/16 0441  NA 143 141  --  142  141  K 3.8 5.1  --  4.5 4.5  CL 108 102  --  100* 103  CO2 22 31  --  30 28  GLUCOSE 166* 98  --  79 138*  BUN 27* 30*  --  39* 36*  CREATININE 1.85* 1.97* 2.03* 2.20* 2.05*  CALCIUM 9.2 9.0  --  9.1 9.4    GFR: Estimated Creatinine Clearance: 18.4 mL/min (by C-G formula based on SCr of 2.05 mg/dL (H)).  Liver Function Tests:  Recent Labs Lab 06/13/16 0521  AST 26  ALT 14  ALKPHOS 59  BILITOT 0.8  PROT 5.9*  ALBUMIN 3.1*    Coagulation Profile:  Recent Labs Lab 06/13/16 0530 06/14/16 0244 06/15/16 0459 06/16/16 0441  INR 1.68 1.77 1.65 1.31    CBG:  Recent Labs Lab 06/15/16 0726 06/15/16 1159 06/15/16 1715 06/15/16 2127 06/16/16 0724  GLUCAP 84 124* 110* 126* 136*     Recent Results (from the past 240 hour(s))  MRSA PCR Screening     Status: Abnormal   Collection Time: 06/13/16 12:21 PM  Result Value Ref Range Status   MRSA by PCR POSITIVE (A) NEGATIVE Final    Comment:        The GeneXpert MRSA Assay (FDA approved for NASAL specimens only), is one component of a comprehensive MRSA colonization surveillance program. It is not intended to diagnose MRSA infection nor to guide or monitor treatment for MRSA infections. RESULT CALLED TO, READ BACK BY AND VERIFIED WITH: Tyler Aas RN 15:05 06/13/16 ([email or web address])       Radiology Studies: Dg Chest Port 1 View  Result Date: 06/15/2016 CLINICAL DATA:  Patient admitted with respiratory distress 06/13/2016. EXAM: PORTABLE CHEST 1 VIEW COMPARISON:  Single-view of the chest 06/13/2016. CT chest 12/14/2014. PA and lateral chest 11/29/2014. FINDINGS: There is cardiomegaly. The patient is status post aortic valve repair. Pacing device is in place. Atherosclerosis is noted. Pulmonary edema seen on the most recent examination is markedly improved. No pneumothorax or pleural effusion. IMPRESSION: Markedly improved pulmonary edema. Cardiomegaly. Atherosclerosis. Electronically Signed   By: Drusilla Kanner M.D.   On:  06/15/2016 14:15     Medications:  Scheduled: . amiodarone  100 mg Oral Daily  . atorvastatin  10 mg Oral q1800  . calcitRIOL  0.25 mcg Oral q morning - 10a  . carvedilol  12.5 mg Oral BID  . Chlorhexidine Gluconate Cloth  6 each Topical Q0600  . cycloSPORINE  1 drop Both Eyes BID  . donepezil  10 mg Oral QHS  . DULoxetine  60 mg Oral QPM  . febuxostat  40 mg Oral QPM  . heparin  5,000 Units Subcutaneous Q8H  . hydrALAZINE  25 mg Oral TID  . insulin aspart  0-15 Units Subcutaneous TID WC  . insulin aspart  0-5 Units Subcutaneous QHS  . levothyroxine  75 mcg Oral QAC breakfast  .  linaclotide  145 mcg Oral Daily  . loratadine  10 mg Oral Daily  . mouth rinse  15 mL Mouth Rinse BID  . memantine  5 mg Oral BID  . mupirocin ointment  1 application Nasal BID  . MUSCLE RUB   Topical BID  . pantoprazole  40 mg Oral Daily  . predniSONE  40 mg Oral BID WC  . pregabalin  50 mg Oral 3 times per day  . senna  2 tablet Oral BID  . sodium chloride flush  3 mL Intravenous Q12H  . Warfarin - Pharmacist Dosing Inpatient   Does not apply q1800   Continuous:  ZOX:WRUEAV chloride, acetaminophen, bisacodyl, HYDROcodone-acetaminophen, ipratropium-albuterol, ondansetron (ZOFRAN) IV, simethicone, sodium chloride flush  Assessment/Plan:  Principal Problem:   Acute respiratory failure (HCC) Active Problems:   Paroxysmal atrial fibrillation (HCC)   Hypertension   Acute on chronic systolic and diastolic heart failure, NYHA class 3 (HCC)   Dementia without behavioral disturbance   CKD (chronic kidney disease) stage 4, GFR 15-29 ml/min (HCC)   Elevated blood sugar   Acute on chronic combined systolic and diastolic congestive heart failure (HCC)   Unstable angina (HCC)    Acute respiratory failure with hypoxia Patient initially required BiPAP. She stabilized after she was diuresed. Currently on oxygen by nasal cannula. Much improved.  Acute systolic CHF Echocardiogram shows that her EF is  35-40%. This is less than what it was in 2016. Wall motion abnormalities also noted. Patient was placed on IV lasix. Cardiology was consulted. Underwent stress test which revealed fixed defects. Cardiology to address further. With rising creatinine lasix was held by cards. Chest x-ray was repeated yesterday and revealed improvement in pulmonary edema. Since she continued to wheeze and does have a history of asthma, she was started on nebulizer treatments and steroids, which seems to have helped. Weight remains stable though she did not have much of a urine output over the last 24 hours. Continue beta blocker. No ACE inhibitor due to renal failure. Continue to monitor ins and outs and daily weights.    Chronic kidney disease, stage IV. Renal function is close to baseline. However rise in creatinine noted. Slightly improved today. However, patient has not made much urine in the last 24 hours without diuretics. Discussed with Dr. Hyman Hopes who will evaluate patient today.   History of aortic stenosis, status post TAVR Prosthetic valve seen on echocardiogram. Gradients could not be measured. Further management per cardiology.  History of paroxysmal atrial fibrillation Chads2vascular score is 6. Patient is on warfarin, which will be continued. Patient is also on amiodarone as well as beta blocker, which will also be continued.  History of essential hypertension. Monitor blood pressures. Continue home medications.  History of dementia. Continue Namenda and Aricept. Mental status appears to be close to baseline. No neurological deficits.  History of hypothyroidism. TSh is normal but FT4 was high. Dose of levothyroxine reduced to from .  DVT Prophylaxis: On warfarin    Code Status: Full code  Family Communication: Discussed with the patient. No family at bedside  Disposition Plan: Management as above. Await nephrology input and further cardiology input.    LOS: 3 days    [facility]  [facility] Pager [phone] 06/16/2016, 9:13 AM  If 7PM-7AM, please contact night-coverage at [email or web address], password [facility]

## 2016-06-16 NOTE — Evaluation (Signed)
Physical Therapy Evaluation Patient Details Name: [name] MRN: [id number] DOB: April 06, 1933 Today's Date: 06/16/2016   History of Present Illness  [name] is a 83 y.o. female with PMHx: diastolic HF, CKD stage IV, aortic stenosis, dementia, chronic pain, HTN, paroxysmal A-fib, right patella tendon repair, remote lt patellar tendon repair. Admitted with  respiratory distress with xray revealing mild CHF.  Clinical Impression  Pt admitted with above diagnosis and presents to PT with functional limitations due to deficits listed below (See PT problem list). Pt needs skilled PT to maximize independence and safety to allow discharge to back to SNF. Pt motivated to amb more but needs assist to do so.     Follow Up Recommendations SNF (return to SNF with restorative nursing vs PT)    Equipment Recommendations  None recommended by PT    Recommendations for Other Services       Precautions / Restrictions Precautions Precautions: Fall Precaution Comments: Decreased use of right shoulder Restrictions Weight Bearing Restrictions: No      Mobility  Bed Mobility Overal bed mobility: Needs Assistance Bed Mobility: Supine to Sit     Supine to sit: Min assist;HOB elevated (and use of rail)     General bed mobility comments: Pt up in chair  Transfers Overall transfer level: Needs assistance Equipment used: Rolling walker (2 wheeled) Transfers: Sit to/from [employer] Sit to Stand: Min assist Stand pivot transfers: Min assist       General transfer comment: Assist to bring hips up. Pt performed chair to bsc to chair with pivot without assistive device with min A  Ambulation/Gait Ambulation/Gait assistance: Min assist Ambulation Distance (Feet): 40 Feet Assistive device: Rolling walker (2 wheeled) Gait Pattern/deviations: Step-through pattern;Decreased step length - right;Decreased step length - left;Trunk flexed Gait velocity: decr Gait velocity  interpretation: <1.8 ft/sec, indicative of risk for recurrent falls General Gait Details: Assist for balance. Verbal cues to stand more erect. Amb on RA with SpO2 97% after amb  Stairs            Wheelchair Mobility    Modified Rankin (Stroke Patients Only)       Balance Overall balance assessment: Needs assistance Sitting-balance support: No upper extremity supported;Feet supported Sitting balance-Leahy Scale: Fair     Standing balance support: Bilateral upper extremity supported;During functional activity Standing balance-Leahy Scale: Poor Standing balance comment: walker and supervision for static standing                             Pertinent Vitals/Pain Pain Assessment: 0-10 Pain Score: 4  Pain Location: all over Pain Descriptors / Indicators: Aching;Sore Pain Intervention(s): Limited activity within patient's tolerance;Monitored during session;Repositioned    Home Living Family/patient expects to be discharged to:: Skilled nursing facility                      Prior Function           Comments: Pt reports she primarily uses w/c at SNF because she is not allowed to amb on her own and staff have not been able to assist her with amb     Hand Dominance   Dominant Hand: Right    Extremity/Trunk Assessment   Upper Extremity Assessment Upper Extremity Assessment: Defer to OT evaluation RUE Deficits / Details: Decreased AROM of shoulder, painful when she tries to push with it RUE Coordination: decreased gross motor    Lower Extremity  Assessment Lower Extremity Assessment: Generalized weakness;RLE deficits/detail RLE Deficits / Details: Unable to achieve and hold knee in full extension due to surgeries       Communication   Communication: HOH  Cognition Arousal/Alertness: Awake/alert Behavior During Therapy: WFL for tasks assessed/performed Overall Cognitive Status: History of cognitive impairments - at baseline                       General Comments      Exercises     Assessment/Plan    PT Assessment Patient needs continued PT services  PT Problem List Decreased strength;Decreased activity tolerance;Decreased balance;Decreased mobility;Obesity       PT Treatment Interventions DME instruction;Gait training;Functional mobility training;Therapeutic activities;Therapeutic exercise;Balance training;Patient/family education    PT Goals (Current goals can be found in the Care Plan section)  Acute Rehab PT Goals Patient Stated Goal: to be able walk more PT Goal Formulation: With patient Time For Goal Achievement: 06/23/16 Potential to Achieve Goals: Good    Frequency Min 3X/week   Barriers to discharge        Co-evaluation               End of Session Equipment Utilized During Treatment: Gait belt Activity Tolerance: Patient tolerated treatment well Patient left: in chair;with call bell/phone within reach;with chair alarm set   PT Visit Diagnosis: Difficulty in walking, not elsewhere classified (R26.2)         Time: 6269-4854 PT Time Calculation (min) (ACUTE ONLY): 22 min   Charges:   PT Evaluation $PT Eval Moderate Complexity: 1 Procedure     PT G CodesAngelina Ok [facility] 26-Jun-2016, 11:24 AM [name] PT [phone]

## 2016-06-16 NOTE — [place] FL2 (Signed)
[place] MEDICAID FL2 LEVEL OF CARE SCREENING TOOL     IDENTIFICATION  Patient Name: [name] Birthdate: [birth year] Sex: female Admission Date (Current Location): 06/13/2016  [facility] and [place] Number:  Producer, television/film/video and Address:  [facility], [address], [place], [place] [address]      Provider Number: [id number]  Attending Physician Name and Address:  Osvaldo Shipper, MD  Relative Name and Phone Number:       Current Level of Care: Hospital Recommended Level of Care: Skilled Nursing Facility Prior Approval Number:    Date Approved/Denied:   PASRR Number: [id number]  Discharge Plan: SNF    Current Diagnoses: Patient Active Problem List   Diagnosis Date Noted  . Acute on chronic combined systolic and diastolic congestive heart failure (HCC)   . Unstable angina (HCC)   . Acute respiratory failure (HCC) 06/13/2016  . Elevated blood sugar 06/13/2016  . Dementia without behavioral disturbance 01/09/2016  . Chronic constipation 01/09/2016  . Neuropathic pain 01/09/2016  . Esophageal reflux 01/09/2016  . Hypothyroidism 01/09/2016  . CKD (chronic kidney disease) stage 4, GFR 15-29 ml/min (HCC) 01/09/2016  . Right patellar tendon rupture 06/25/2015  . Avulsion of right patellar tendon 03/08/2015  . Aspiration pneumonia (HCC) 12/14/2014  . Anemia of chronic disease 12/14/2014  . Aspiration into airway 12/14/2014  . Swallowing dysfunction 12/14/2014  . S/P right TKA 12/04/2014  . S/P knee replacement 12/04/2014  . Acute blood loss anemia 12/28/2012  . Thrombocytopenia (HCC) 12/28/2012  . Toe fracture 12/28/2012  . CKD (chronic kidney disease), stage III 12/28/2012  . Low back pain 12/28/2012  . Wound dehiscence, surgical 12/28/2012  . Atelectasis 12/28/2012  . S/P TAVR (transcatheter aortic valve replacement) 12/21/2012  . Severe aortic stenosis 12/06/2012  . Aortic stenosis 10/13/2012  . Acute on chronic systolic and  diastolic heart failure, NYHA class 3 (HCC) 10/13/2012  . Pacemaker 10/15/2010  . Paroxysmal atrial fibrillation (HCC) 10/15/2010  . Dyslipidemia 10/15/2010  . Hypertension 10/15/2010  . Chronic diastolic heart failure (HCC) 10/15/2010    Orientation RESPIRATION BLADDER Height & Weight     Self, Time, Situation, Place  O2 (Nasal Canula 2 L. Hasn't used bipap since 2/23.) Incontinent Weight: 176 lb 9.6 oz (80.1 kg) (c scale) Height:  4\' 9"  (144.8 cm)  BEHAVIORAL SYMPTOMS/MOOD NEUROLOGICAL BOWEL NUTRITION STATUS   (None)  (Dementia without behavioral disturbance) Incontinent Diet (Heart healthy)  AMBULATORY STATUS COMMUNICATION OF NEEDS Skin   Limited Assist Verbally Bruising                       Personal Care Assistance Level of Assistance  Bathing, Feeding, Dressing Bathing Assistance: Limited assistance Feeding assistance: Independent Dressing Assistance: Limited assistance     Functional Limitations Info  Sight, Hearing, Speech Sight Info: Adequate Hearing Info: Impaired Speech Info: Adequate    SPECIAL CARE FACTORS FREQUENCY  PT (By licensed PT), Blood pressure     PT Frequency: 5 x week              Contractures Contractures Info: Not present    Additional Factors Info  Code Status, Allergies, Isolation Precautions Code Status Info: Full Allergies Info: Nubain (Nalbuphine Hcl), Codeine, Darvocet (Propoxyphene N-acetaminophen)     Isolation Precautions Info: Contact: MRSA     Current Medications (06/16/2016):  This is the current hospital active medication list Current Facility-Administered Medications  Medication Dose Route Frequency Provider Last Rate Last Dose  .  0.9 %  sodium chloride infusion  250 mL Intravenous PRN Levie Heritage, DO   Stopped at 06/14/16 0100  . acetaminophen (TYLENOL) tablet 650 mg  650 mg Oral Q4H PRN Rhona Raider Stinson, DO      . amiodarone (PACERONE) tablet 100 mg  100 mg Oral Daily Rhona Raider Stinson, DO   100 mg at 06/16/16  0827  . atorvastatin (LIPITOR) tablet 10 mg  10 mg Oral q1800 Rhona Raider Stinson, DO   10 mg at 06/15/16 1717  . bisacodyl (DULCOLAX) suppository 10 mg  10 mg Rectal Daily PRN Levie Heritage, DO      . calcitRIOL (ROCALTROL) capsule 0.25 mcg  0.25 mcg Oral q morning - 10a Rhona Raider Stinson, DO   0.25 mcg at 06/16/16 0827  . carvedilol (COREG) tablet 12.5 mg  12.5 mg Oral BID Rhona Raider Stinson, DO   12.5 mg at 06/16/16 1914  . Chlorhexidine Gluconate Cloth 2 % PADS 6 each  6 each Topical Q0600 Bobette Mo, MD   6 each at 06/16/16 0600  . cycloSPORINE (RESTASIS) 0.05 % ophthalmic emulsion 1 drop  1 drop Both Eyes BID Levie Heritage, DO   1 drop at 06/16/16 7829  . donepezil (ARICEPT) tablet 10 mg  10 mg Oral QHS Rhona Raider Stinson, DO   10 mg at 06/15/16 2154  . DULoxetine (CYMBALTA) DR capsule 60 mg  60 mg Oral QPM Rhona Raider Stinson, DO   60 mg at 06/15/16 1717  . febuxostat (ULORIC) tablet 40 mg  40 mg Oral QPM Rhona Raider Stinson, DO   40 mg at 06/15/16 1716  . heparin injection 5,000 Units  5,000 Units Subcutaneous Q8H Leone Brand, NP   5,000 Units at 06/16/16 1233  . hydrALAZINE (APRESOLINE) tablet 25 mg  25 mg Oral TID Levie Heritage, DO   25 mg at 06/16/16 5621  . HYDROcodone-acetaminophen (NORCO) 7.5-325 MG per tablet 1 tablet  1 tablet Oral Q4H PRN Levie Heritage, DO   1 tablet at 06/14/16 2335  . insulin aspart (novoLOG) injection 0-15 Units  0-15 Units Subcutaneous TID WC Levie Heritage, DO   2 Units at 06/16/16 1232  . insulin aspart (novoLOG) injection 0-5 Units  0-5 Units Subcutaneous QHS Rhona Raider Stinson, DO      . ipratropium-albuterol (DUONEB) 0.5-2.5 (3) MG/3ML nebulizer solution 3 mL  3 mL Nebulization Q6H PRN Rhona Raider Stinson, DO      . isosorbide mononitrate (IMDUR) 24 hr tablet 15 mg  15 mg Oral Daily Berton Bon, NP   15 mg at 06/16/16 1237  . levothyroxine (SYNTHROID, LEVOTHROID) tablet 75 mcg  75 mcg Oral QAC breakfast Osvaldo Shipper, MD   75 mcg at 06/16/16 0827  .  linaclotide (LINZESS) capsule 145 mcg  145 mcg Oral Daily Levie Heritage, DO   145 mcg at 06/16/16 0825  . loratadine (CLARITIN) tablet 10 mg  10 mg Oral Daily Rhona Raider Stinson, DO   10 mg at 06/16/16 3086  . MEDLINE mouth rinse  15 mL Mouth Rinse BID Osvaldo Shipper, MD   15 mL at 06/16/16 0830  . memantine (NAMENDA) tablet 5 mg  5 mg Oral BID Rhona Raider Stinson, DO   5 mg at 06/16/16 5784  . mupirocin ointment (BACTROBAN) 2 % 1 application  1 application Nasal BID Bobette Mo, MD   1 application at 06/16/16 [phone]  . MUSCLE RUB CREA   Topical BID Gerilyn Pilgrim  Manus Rudd, DO   1 application at 06/16/16 [phone]  . ondansetron (ZOFRAN) injection 4 mg  4 mg Intravenous Q6H PRN Rhona Raider Stinson, DO      . pantoprazole (PROTONIX) EC tablet 40 mg  40 mg Oral Daily Rhona Raider Stinson, DO   40 mg at 06/16/16 0827  . predniSONE (DELTASONE) tablet 40 mg  40 mg Oral BID WC Osvaldo Shipper, MD   40 mg at 06/16/16 0543  . pregabalin (LYRICA) capsule 50 mg  50 mg Oral 3 times per day Levie Heritage, DO   50 mg at 06/16/16 1233  . senna (SENOKOT) tablet 17.2 mg  2 tablet Oral BID Rhona Raider Stinson, DO   17.2 mg at 06/15/16 2155  . simethicone (MYLICON) chewable tablet 80 mg  80 mg Oral Q12H PRN Rhona Raider Stinson, DO      . sodium chloride flush (NS) 0.9 % injection 3 mL  3 mL Intravenous Q12H Rhona Raider Stinson, DO   3 mL at 06/16/16 0349  . sodium chloride flush (NS) 0.9 % injection 3 mL  3 mL Intravenous PRN Rhona Raider Stinson, DO   3 mL at 06/13/16 2259  . Warfarin - Pharmacist Dosing Inpatient   Does not apply q1800 Leone Brand, NP       Facility-Administered Medications Ordered in Other Encounters  Medication Dose Route Frequency Provider Last Rate Last Dose  . sodium bicarbonate first hour bolus via infusion   Intravenous Once Pearson Grippe, MD         Discharge Medications: Please see discharge summary for a list of discharge medications.  Relevant Imaging Results:  Relevant Lab Results:   Additional Information SS#:  [id number]  Margarito Liner, LCSW

## 2016-06-16 NOTE — Clinical Social Work Note (Signed)
Clinical Social Work Assessment  Patient Details  Name: ETHYLENE REZNICK MRN: [id number] Date of Birth: [birth year]  Date of referral:  06/16/16               Reason for consult:  Discharge Planning                Permission sought to share information with:  Facility Sport and exercise psychologist, Family Supports Permission granted to share information::  Yes, Verbal Permission Granted  Name::     Burgandy Hackworth  Agency::  [facility]  Relationship::  Son  Contact Information:  [phone]  Housing/Transportation Living arrangements for the past 2 months:  [place] of Information:  Patient, Medical Team Patient Interpreter Needed:  None Criminal Activity/Legal Involvement Pertinent to Current Situation/Hospitalization:  No - Comment as needed Significant Relationships:  Adult Children Lives with:  Facility Resident Do you feel safe going back to the place where you live?  Yes Need for family participation in patient care:  Yes (Comment)  Care giving concerns:  Patient was admitted from [facility]. PT recommending SNF once medically stable for discharge.   Social Worker assessment / plan:  CSW met with patient. No supports at bedside. CSW introduced role and explained that discharge planning would be discussed. Patient confirmed that she was admitted from [facility] and plans to return once discharged. She is unsure whether her son will transport her or if she will need PTAR. No further concerns. CSW encouraged patient to contact CSW as needed. CSW will continue to follow patient for support and facilitate discharge back to SNF once medically stable.  Employment status:  Retired Nurse, adult PT Recommendations:  [place] / Referral to community resources:  [place]  Patient/Family's Response to care:  Patient agreeable to return to [employer]. Patient's children supportive and involved in  patient's care. Patient appreciated social work intervention.  Patient/Family's Understanding of and Emotional Response to Diagnosis, Current Treatment, and Prognosis:  Patient appears to have a good understanding of the reason for admission. Patient appears happy with hospital care.  Emotional Assessment Appearance:  Appears stated age Attitude/Demeanor/Rapport:  Other (Pleasant) Affect (typically observed):  Accepting, Appropriate, Calm, Pleasant Orientation:  Oriented to Self, Oriented to Place, Oriented to  Time, Oriented to Situation Alcohol / Substance use:  Never Used Psych involvement (Current and /or in the community):  No (Comment)  Discharge Needs  Concerns to be addressed:  Care Coordination Readmission within the last 30 days:  No Current discharge risk:  Dependent with Mobility Barriers to Discharge:  Continued Medical Work up   Candie Chroman, LCSW 06/16/2016, 1:29 PM

## 2016-06-16 NOTE — Evaluation (Addendum)
Occupational Therapy Evaluation and Discharge Patient Details Name: [name] MRN: [id number] DOB: 1932/09/21 Today's Date: 06/16/2016    History of Present Illness [name] is a 83 y.o. female with PMHx: diastolic HF, CKD stage IV, aortic stenosis, dementia, chronic pain, HTN, paroxysmal A-fib, right patella tendon repair. Admitted with  respiratory distress with xray revealing mild CHF.   Clinical Impression   This 83 yo female admitted with above presents to acute OT with above. She will benefit from continued OT at SNF, we will defer remainder of OT back to SNF. Pt's O2 sats at end of session 94% and HR 72 on RA. Acute OT will D/C CL    Follow Up Recommendations  SNF    Equipment Recommendations  None recommended by OT       Precautions / Restrictions Precautions Precautions: Fall Precaution Comments: Decreased use of right shoulder Restrictions Weight Bearing Restrictions: No      Mobility Bed Mobility Overal bed mobility: Needs Assistance Bed Mobility: Supine to Sit     Supine to sit: Min assist;HOB elevated (and use of rail)        Transfers Overall transfer level: Needs assistance Equipment used: Rolling walker (2 wheeled) Transfers: Sit to/from Stand Sit to Stand: Min assist              Balance Overall balance assessment: Needs assistance Sitting-balance support: No upper extremity supported;Feet supported Sitting balance-Leahy Scale: Fair     Standing balance support: Bilateral upper extremity supported;During functional activity Standing balance-Leahy Scale: Poor Standing balance comment: reliant on RW                            ADL Overall ADL's : Needs assistance/impaired Eating/Feeding: Set up (sitting in recliner)   Grooming: Set up;Supervision/safety (sitting in recliner)   Upper Body Bathing: Supervision/ safety;Set up (sitting in recliner)   Lower Body Bathing: Maximal assistance Lower Body Bathing  Details (indicate cue type and reason): min A sit<>stand recliner Upper Body Dressing : Maximal assistance Upper Body Dressing Details (indicate cue type and reason): sitting in recliner Lower Body Dressing: Total assistance Lower Body Dressing Details (indicate cue type and reason): min A sit<>stand recliner Toilet Transfer: Minimal assistance;Ambulation;RW Toilet Transfer Details (indicate cue type and reason): bed>around to recliner Toileting- Clothing Manipulation and Hygiene: Maximal assistance (min A sit<>stand)               Vision Patient Visual Report: No change from baseline              Pertinent Vitals/Pain Pain Assessment: 0-10 Pain Score: 4  Pain Location: all over Pain Descriptors / Indicators: Aching;Sore Pain Intervention(s): Limited activity within patient's tolerance;Monitored during session;Repositioned     Hand Dominance Right   Extremity/Trunk Assessment Upper Extremity Assessment Upper Extremity Assessment: RUE deficits/detail RUE Deficits / Details: Decreased AROM of shoulder, painful when she tries to push with it RUE Coordination: decreased gross motor           Communication Communication Communication: HOH   Cognition Arousal/Alertness: Awake/alert Behavior During Therapy: WFL for tasks assessed/performed Overall Cognitive Status: History of cognitive impairments - at baseline                                Home Living Family/patient expects to be discharged to:: Skilled nursing facility  Prior Functioning/Environment          Comments: Reports she really hasn't walked since Dec 2017 and that staff at SNF have had to A her with basic ADLs since Dec as well        OT Problem List: Decreased strength;Decreased range of motion;Impaired balance (sitting and/or standing);Impaired UE functional use;Obesity;Pain         OT Goals(Current goals can be found in the  care plan section) Acute Rehab OT Goals Patient Stated Goal: to be able walk more  OT Frequency:                End of Session Equipment Utilized During Treatment: Gait belt;Rolling walker  Activity Tolerance: Patient tolerated treatment well Patient left: in chair;with call bell/phone within reach (getting ready to work with PT)  OT Visit Diagnosis: Unsteadiness on feet (R26.81);History of falling (Z91.81);Pain Pain - Right/Left: Right Pain - part of body: Shoulder                ADL either performed or assessed with clinical judgement  Time: 4098-1191 OT Time Calculation (min): 22 min Charges:  OT General Charges $OT Visit: 1 Procedure OT Evaluation $OT Eval Moderate Complexity: 1 Procedure   Ignacia Palma, OTR/L [phone] 06/16/2016

## 2016-06-16 NOTE — Consult Note (Signed)
Referring Provider: No ref. provider found Primary Care Physician:  Oneal Grout, MD Primary Nephrologist:  Dr. Allena Katz  Reason for Consultation:  Known CKD admitted from [facility] with congestive heart failure  Acute on chronic renal disease  HPI: [name] is a 83 y.o. female with a history of diastolic heart failure grade 1 an echocardiogram on 02/2015, CK D stage IV, aortic stenosis, dementia, chronic pain, hypertension, paroxysmal atrial fibrillation with chads 2 vasc score of 6 on coumadin. Patient is resident of [facility] center presents with onset of respiratory distress. She was found to be in respiratory distress by nursing home staff, was brought to the emergency room for evaluation. Patient has dementia and is unable to provide history.  Baseline creatinine 2   Good response to diuretics    Creatinine about at baseline  Concern regarding appropriate dosing of diuretics    Past Medical History:  Diagnosis Date  . Acute on chronic renal failure [facility])    sees Dr Allena Katz   . Anemia    Acute blood loss  . Aortic regurgitation   . Aortic stenosis 10/13/2012   Low EF, low gradient with severe aortic stenosis confirmed by dobutamine stress echocardiogram s/p TAVR 12/2012  . Asthma   . Atrial fibrillation (HCC)    tachy-brady syndrome with <1% recurrent PAF since pacemaker placement  . Cataracts, bilateral   . Chronic combined systolic and diastolic CHF (congestive heart failure) (HCC)   . Chronic lower back pain   . CKD (chronic kidney disease)   . Coronary artery disease involving native coronary artery of native heart   . Dementia    Without behavioral disturbance  . Dysrhythmia   . Fibromyalgia   . Gastroesophageal reflux disease   . H/O dizziness   . H/O urinary frequency   . H/O: stroke   . Hard of hearing   . Headache    hx of migraines   . Heart murmur   . History of blood transfusion   . History of bronchitis   . History of kidney stones   .  History of urinary tract infection   . HLD (hyperlipidemia)   . HTN (hypertension)   . Hypothyroidism   . Neuropathy (HCC)   . Nonischemic cardiomyopathy (HCC)   . On home oxygen therapy    patient uses at nite- 2L- has not used in > 6 months per patient  . Osteoarthritis   . Osteoporosis   . Pneumonia    hx of x 3   . PONV (postoperative nausea and vomiting)   . Presence of permanent cardiac pacemaker   . Pulmonary embolism (HCC)    HISTORY OF, the pt. had a recurrent bilateral pulmonary emboli in 2005, on warfarin therapy and at which time she under went implantation of IVC filter  . Repeated falls   . Rupture of right patellar tendon   . Sciatica   . Shortness of breath dyspnea    with exertion   . Sleep apnea    uses oxygen at night and PRN- not used since > 6 months / DOES NOT USE  C-PAP  . Spinal stenosis   . Stroke (HCC)   . Symptomatic bradycardia   . Symptomatic bradycardia 2012   s/p Medtronic PPM  . Syncope   . Urinary incontinence   . Urinary urgency     Past Surgical History:  Procedure Laterality Date  . ABDOMINAL HYSTERECTOMY    . APPENDECTOMY    .  BACK SURGERY    . BUNIONECTOMY    . CARDIAC CATHETERIZATION    . CATARACT EXTRACTION    . CENTRAL VENOUS CATHETER INSERTION Left 12/21/2012   Procedure: INSERTION CENTRAL LINE ADULT;  Surgeon: Tonny Bollman, MD;  Location: [facility] OR;  Service: Open Heart Surgery;  Laterality: Left;  . ELBOW SURGERY     bilat   . INTRAOPERATIVE TRANSESOPHAGEAL ECHOCARDIOGRAM N/A 12/21/2012   Procedure: INTRAOPERATIVE TRANSESOPHAGEAL ECHOCARDIOGRAM;  Surgeon: Tonny Bollman, MD;  Location: [facility] OR;  Service: Open Heart Surgery;  Laterality: N/A;  . KNEE SURGERY Left   . LEFT AND RIGHT HEART CATHETERIZATION WITH CORONARY ANGIOGRAM N/A 10/18/2012   Procedure: LEFT AND RIGHT HEART CATHETERIZATION WITH CORONARY ANGIOGRAM;  Surgeon: Tonny Bollman, MD;  Location: [facility] CATH LAB;  Service: Cardiovascular;  Laterality: N/A;  . NASAL SEPTUM  SURGERY    . NOSE SURGERY     X 2  . ORIF PATELLA Right 03/08/2015   Procedure:  OPEN REDUCTION INTERNAL FIXATION RIGHT  PATELLA TENDON AVULSION;  Surgeon: Durene Romans, MD;  Location: [facility] ORS;  Service: Orthopedics;  Laterality: Right;  . PACEMAKER INSERTION     Medtronic  . PATELLAR TENDON REPAIR Right 06/25/2015   Procedure: RIGHT PATELLA TENDON REVISION/REPAIR;  Surgeon: Durene Romans, MD;  Location: [facility] ORS;  Service: Orthopedics;  Laterality: Right;  . SHOULDER SURGERY     bilat   . THYROIDECTOMY, PARTIAL    . TONSILLECTOMY    . TOTAL KNEE ARTHROPLASTY Right 12/04/2014   Procedure: RIGHT TOTAL  KNEE ARTHROPLASTY;  Surgeon: Durene Romans, MD;  Location: [facility] ORS;  Service: Orthopedics;  Laterality: Right;  . TRANSCATHETER AORTIC VALVE REPLACEMENT, TRANSFEMORAL  12/21/2012   a. 29mm Edwards Sapien XT transcatheter heart valve placed via open left transfemoral approach b. Intra-op TEE: well-seated bioprosthetic aortic valve with mean gradient 2 mmHg, trivial AI, mild MR, EF 30-35%  . TRANSCATHETER AORTIC VALVE REPLACEMENT, TRANSFEMORAL N/A 12/21/2012   Procedure: TRANSCATHETER AORTIC VALVE REPLACEMENT, TRANSFEMORAL;  Surgeon: Tonny Bollman, MD;  Location: [facility] OR;  Service: Open Heart Surgery;  Laterality: N/A;    Prior to Admission medications   Medication Sig Start Date End Date Taking? Authorizing Provider  alendronate (FOSAMAX) 70 MG/75ML solution Take 70 mg by mouth every 7 (seven) days. Take with a full glass of water on an empty stomach on Saturday   Yes Historical Provider, MD  amiodarone (PACERONE) 100 MG tablet Take 100 mg by mouth daily.   Yes Historical Provider, MD  atorvastatin (LIPITOR) 10 MG tablet Take 10 mg by mouth daily at 6 PM.    Yes Historical Provider, MD  BIOTIN 5000 PO Take 5,000 mcg by mouth every evening.    Yes Historical Provider, MD  bisacodyl (DULCOLAX) 10 MG suppository Place 10 mg rectally daily as needed for moderate constipation.   Yes Historical Provider, MD   calcitRIOL (ROCALTROL) 0.25 MCG capsule Take 0.25 mcg by mouth every morning.    Yes Historical Provider, MD  carvedilol (COREG) 12.5 MG tablet Take 1 tablet by mouth 2 (two) times daily.  07/20/14  Yes Historical Provider, MD  cetirizine (ZYRTEC) 10 MG tablet Take 10 mg by mouth daily.    Yes Historical Provider, MD  Cholecalciferol (VITAMIN D-3) 1000 units CAPS Take 2 capsules by mouth. Take 2 capsules to = 2000 units PO QHS   Yes Historical Provider, MD  cycloSPORINE (RESTASIS) 0.05 % ophthalmic emulsion Place 1 drop into both eyes 2 (two) times daily.    Yes Historical Provider,  MD  donepezil (ARICEPT) 10 MG tablet Take 10 mg by mouth at bedtime.    Yes Historical Provider, MD  DULoxetine (CYMBALTA) 60 MG capsule Take 60 mg by mouth every evening.    Yes Historical Provider, MD  esomeprazole (NEXIUM) 40 MG capsule Take 40 mg by mouth daily.    Yes Historical Provider, MD  ferrous sulfate 325 (65 FE) MG tablet Take 325 mg by mouth daily.    Yes Historical Provider, MD  hydrALAZINE (APRESOLINE) 25 MG tablet Take 25 mg by mouth 3 (three) times daily.   Yes Historical Provider, MD  HYDROcodone-acetaminophen (NORCO) 7.5-325 MG tablet Take 1-2 tablets by mouth every 4 (four) hours as needed for moderate pain.   Yes Historical Provider, MD  ipratropium-albuterol (DUONEB) 0.5-2.5 (3) MG/3ML SOLN Take 3 mLs by nebulization every 6 (six) hours as needed (wheezing/shortness of breath).   Yes Historical Provider, MD  levothyroxine (SYNTHROID, LEVOTHROID) 112 MCG tablet Take 112 mcg by mouth daily.    Yes Historical Provider, MD  Linaclotide Karlene Einstein) 145 MCG CAPS capsule Take 145 mcg by mouth daily.    Yes Historical Provider, MD  memantine (NAMENDA) 5 MG tablet Take 5 mg by mouth 2 (two) times daily.    Yes Historical Provider, MD  Menthol (ICY HOT ADVANCED RELIEF) 7.5 % PTCH Apply 3 patches topically daily. Apply 1 patch to each knee and one patch across lower back.  Apply at 10AM and remove at 10PM   Yes  Historical Provider, MD  Menthol, Topical Analgesic, (BIOFREEZE) 4 % GEL Apply 1 application topically 2 (two) times daily. Apply to back of neck, lower back, and right leg   Yes Historical Provider, MD  Multiple Vitamin (MULTIVITAMIN) tablet Take 1 tablet by mouth daily.    Yes Historical Provider, MD  nitroGLYCERIN (NITROSTAT) 0.4 MG SL tablet Place 0.4 mg under the tongue every 5 (five) minutes as needed for chest pain (MAX 3 TABLETS).    Yes Historical Provider, MD  polyethylene glycol (MIRALAX / GLYCOLAX) packet Take 17 g by mouth 2 (two) times daily. 12/07/14  Yes Matthew Babish, PA-C  pramoxine (SARNA SENSITIVE) 1 % LOTN Apply 1 application topically daily as needed (itching).    Yes Historical Provider, MD  pregabalin (LYRICA) 50 MG capsule Take 50 mg by mouth 3 (three) times daily. 6AM, 2PM, 10PM   Yes Historical Provider, MD  senna (SENOKOT) 8.6 MG TABS tablet Take 2 tablets by mouth 2 (two) times daily.   Yes Historical Provider, MD  simethicone (MYLICON) 80 MG chewable tablet Chew 80 mg by mouth every 12 (twelve) hours as needed for flatulence.    Yes Historical Provider, MD  torsemide (DEMADEX) 20 MG tablet Take 100 mg by mouth daily.    Yes Historical Provider, MD  Turmeric 500 MG CAPS Take 1 capsule by mouth 2 (two) times daily.   Yes Historical Provider, MD  ULORIC 40 MG tablet Take 40 mg by mouth every evening.  12/02/12  Yes Historical Provider, MD  vitamin B-12 (CYANOCOBALAMIN) 1000 MCG tablet Take 1,000 mcg by mouth 2 (two) times a week. Tuesday and Friday   Yes Historical Provider, MD  warfarin (COUMADIN) 3 MG tablet Take 1.5 mg by mouth daily. Take 1/2 of a 3 mg tablet to = 1.5 mg qd   Yes Historical Provider, MD    Current Facility-Administered Medications  Medication Dose Route Frequency Provider Last Rate Last Dose  . 0.9 %  sodium chloride infusion  250 mL Intravenous PRN  Levie Heritage, DO   Stopped at 06/14/16 0100  . acetaminophen (TYLENOL) tablet 650 mg  650 mg Oral  Q4H PRN Rhona Raider Stinson, DO      . amiodarone (PACERONE) tablet 100 mg  100 mg Oral Daily Rhona Raider Stinson, DO   100 mg at 06/16/16 0827  . atorvastatin (LIPITOR) tablet 10 mg  10 mg Oral q1800 Rhona Raider Stinson, DO   10 mg at 06/15/16 1717  . bisacodyl (DULCOLAX) suppository 10 mg  10 mg Rectal Daily PRN Levie Heritage, DO      . calcitRIOL (ROCALTROL) capsule 0.25 mcg  0.25 mcg Oral q morning - 10a Rhona Raider Stinson, DO   0.25 mcg at 06/16/16 0827  . carvedilol (COREG) tablet 12.5 mg  12.5 mg Oral BID Rhona Raider Stinson, DO   12.5 mg at 06/16/16 1610  . Chlorhexidine Gluconate Cloth 2 % PADS 6 each  6 each Topical Q0600 Bobette Mo, MD   6 each at 06/16/16 0600  . cycloSPORINE (RESTASIS) 0.05 % ophthalmic emulsion 1 drop  1 drop Both Eyes BID Levie Heritage, DO   1 drop at 06/16/16 9604  . donepezil (ARICEPT) tablet 10 mg  10 mg Oral QHS Rhona Raider Stinson, DO   10 mg at 06/15/16 2154  . DULoxetine (CYMBALTA) DR capsule 60 mg  60 mg Oral QPM Rhona Raider Stinson, DO   60 mg at 06/15/16 1717  . febuxostat (ULORIC) tablet 40 mg  40 mg Oral QPM Rhona Raider Stinson, DO   40 mg at 06/15/16 1716  . heparin injection 5,000 Units  5,000 Units Subcutaneous Q8H Leone Brand, NP   5,000 Units at 06/16/16 0543  . hydrALAZINE (APRESOLINE) tablet 25 mg  25 mg Oral TID Rhona Raider Stinson, DO   25 mg at 06/16/16 5409  . HYDROcodone-acetaminophen (NORCO) 7.5-325 MG per tablet 1 tablet  1 tablet Oral Q4H PRN Levie Heritage, DO   1 tablet at 06/14/16 2335  . insulin aspart (novoLOG) injection 0-15 Units  0-15 Units Subcutaneous TID WC Levie Heritage, DO   2 Units at 06/16/16 0825  . insulin aspart (novoLOG) injection 0-5 Units  0-5 Units Subcutaneous QHS Rhona Raider Stinson, DO      . ipratropium-albuterol (DUONEB) 0.5-2.5 (3) MG/3ML nebulizer solution 3 mL  3 mL Nebulization Q6H PRN Rhona Raider Stinson, DO      . isosorbide mononitrate (IMDUR) 24 hr tablet 15 mg  15 mg Oral Daily Berton Bon, NP      . levothyroxine (SYNTHROID,  LEVOTHROID) tablet 75 mcg  75 mcg Oral QAC breakfast Osvaldo Shipper, MD   75 mcg at 06/16/16 0827  . linaclotide (LINZESS) capsule 145 mcg  145 mcg Oral Daily Levie Heritage, DO   145 mcg at 06/16/16 0825  . loratadine (CLARITIN) tablet 10 mg  10 mg Oral Daily Rhona Raider Stinson, DO   10 mg at 06/16/16 8119  . MEDLINE mouth rinse  15 mL Mouth Rinse BID Osvaldo Shipper, MD   15 mL at 06/16/16 0830  . memantine (NAMENDA) tablet 5 mg  5 mg Oral BID Rhona Raider Stinson, DO   5 mg at 06/16/16 1478  . mupirocin ointment (BACTROBAN) 2 % 1 application  1 application Nasal BID Bobette Mo, MD   1 application at 06/16/16 [phone]  . MUSCLE RUB CREA   Topical BID Levie Heritage, DO   1 application at 06/16/16 2130  . ondansetron (ZOFRAN)  injection 4 mg  4 mg Intravenous Q6H PRN Rhona Raider Stinson, DO      . pantoprazole (PROTONIX) EC tablet 40 mg  40 mg Oral Daily Rhona Raider Stinson, DO   40 mg at 06/16/16 0827  . predniSONE (DELTASONE) tablet 40 mg  40 mg Oral BID WC Osvaldo Shipper, MD   40 mg at 06/16/16 0543  . pregabalin (LYRICA) capsule 50 mg  50 mg Oral 3 times per day Levie Heritage, DO   50 mg at 06/16/16 0543  . senna (SENOKOT) tablet 17.2 mg  2 tablet Oral BID Rhona Raider Stinson, DO   17.2 mg at 06/15/16 2155  . simethicone (MYLICON) chewable tablet 80 mg  80 mg Oral Q12H PRN Rhona Raider Stinson, DO      . sodium chloride flush (NS) 0.9 % injection 3 mL  3 mL Intravenous Q12H Rhona Raider Stinson, DO   3 mL at 06/16/16 0981  . sodium chloride flush (NS) 0.9 % injection 3 mL  3 mL Intravenous PRN Rhona Raider Stinson, DO   3 mL at 06/13/16 2259  . Warfarin - Pharmacist Dosing Inpatient   Does not apply q1800 Leone Brand, NP       Facility-Administered Medications Ordered in Other Encounters  Medication Dose Route Frequency Provider Last Rate Last Dose  . sodium bicarbonate first hour bolus via infusion   Intravenous Once Pearson Grippe, MD        Allergies as of 06/13/2016 - Review Complete 06/13/2016  Allergen Reaction  Noted  . Nubain [nalbuphine hcl] Hives 07/11/2010  . Codeine Hives and Nausea Only 07/11/2010  . Darvocet [propoxyphene n-acetaminophen] Nausea Only 07/11/2010    Family History  Problem Relation Age of Onset  . Ovarian cancer Mother     Deceased  . Epilepsy Father     Deceased    Social History   Social History  . Marital status: Divorced    Spouse name: N/A  . Number of children: N/A  . Years of education: N/A   Occupational History  . Not on file.   Social History Main Topics  . Smoking status: Never Smoker  . Smokeless tobacco: Never Used  . Alcohol use No  . Drug use: No  . Sexual activity: Not Currently   Other Topics Concern  . Not on file   Social History Narrative  . No narrative on file    Review of Systems: Gen: Denies any fever, chills, sweats, anorexia, + fatigue, + weakness, malaise, weight loss, and sleep disorder HEENT: No visual complaints, No history of Retinopathy. Normal external appearance No Epistaxis or Sore throat. No sinusitis.   CV: Aortic stenosis and inferior wall defect noted on nuclear scan  Resp:  Dyspnea on exercise  Limited walking with walker GI: Denies vomiting blood, jaundice, and fecal incontinence.   Denies dysphagia or odynophagia. GU : Denies urinary burning, blood in urine, urinary frequency, urinary hesitancy, nocturnal urination, and urinary incontinence.  No renal calculi. MS: Denies joint pain, limitation of movement, and swelling, stiffness, low back pain, extremity pain. Denies muscle weakness, cramps, atrophy.  No use of non steroidal antiinflammatory drugs. Derm: Denies rash, itching, dry skin, hives, moles, warts, or unhealing ulcers.  Psych: Denies depression, anxiety, memory loss, suicidal ideation, hallucinations, paranoia, and confusion. Heme: Denies bruising, bleeding, and enlarged lymph nodes. Neuro: No headache.  No diplopia. No dysarthria.  No dysphasia.  No history of CVA.  No Seizures. No paresthesias.  No  weakness. Endocrine  No DM.  No Thyroid disease.  No Adrenal disease.  Physical Exam: Vital signs in last 24 hours: Temp:  [98.1 F (36.7 C)-98.5 F (36.9 C)] 98.5 F (36.9 C) (02/26 0448) Pulse Rate:  [59-69] 69 (02/26 0448) Resp:  [18-20] 20 (02/26 0448) BP: (113-167)/(42-85) 134/64 (02/26 0448) SpO2:  [95 %-97 %] 95 % (02/26 0448) Weight:  [80.1 kg (176 lb 9.6 oz)] 80.1 kg (176 lb 9.6 oz) (02/26 0448) Last BM Date: 06/14/16 General:   Elderly talkative  Head:  Normocephalic and atraumatic. Eyes:  Sclera clear, no icterus.   Conjunctiva pink. Ears:  Normal auditory acuity. Nose:  No deformity, discharge,  or lesions. Mouth:  No deformity or lesions, dentition normal. Neck:  Supple; no masses or thyromegaly. JVP not elevated Lungs:  Clear throughout to auscultation.   No wheezes, crackles, or rhonchi. No acute distress. Heart:  Regular rate and rhythm; no murmurs, clicks, rubs,  or gallops. Abdomen:  Soft, nontender and nondistended. No masses, hepatosplenomegaly or hernias noted. Normal bowel sounds, without guarding, and without rebound.   Msk:  Symmetrical without gross deformities. Normal posture. Pulses:  No carotid, renal, femoral bruits. DP and PT symmetrical and equal Extremities:  Without clubbing or edema. Neurologic:  Alert and  oriented x4;  grossly normal neurologically. Skin:  Intact without significant lesions or rashes. Cervical Nodes:  No significant cervical adenopathy. Psych:  Alert and cooperative. Normal mood and affect.  Intake/Output from previous day: 02/25 0701 - 02/26 0700 In: 635 [P.O.:635] Out: 375 [Urine:375] Intake/Output this shift: Total I/O In: 635 [P.O.:635] Out: 200 [Urine:200]  Lab Results:  Recent Labs  06/14/16 1444  WBC 8.9  HGB 11.2*  HCT 35.1*  PLT 169   BMET  Recent Labs  06/14/16 0244 06/14/16 1444 06/15/16 0459 06/16/16 0441  NA 141  --  142 141  K 5.1  --  4.5 4.5  CL 102  --  100* 103  CO2 31  --  30 28   GLUCOSE 98  --  79 138*  BUN 30*  --  39* 36*  CREATININE 1.97* 2.03* 2.20* 2.05*  CALCIUM 9.0  --  9.1 9.4   LFT No results for input(s): PROT, ALBUMIN, AST, ALT, ALKPHOS, BILITOT, BILIDIR, IBILI in the last 72 hours. PT/INR  Recent Labs  06/15/16 0459 06/16/16 0441  LABPROT 19.7* 16.4*  INR 1.65 1.31   Hepatitis Panel No results for input(s): HEPBSAG, HCVAB, HEPAIGM, HEPBIGM in the last 72 hours.  Studies/Results: Nm Myocar Multi W/spect W/wall Motion / Ef  Result Date: 06/16/2016 CLINICAL DATA:  Chest pain. EXAM: MYOCARDIAL IMAGING WITH SPECT (REST AND PHARMACOLOGIC-STRESS) GATED LEFT VENTRICULAR WALL MOTION STUDY LEFT VENTRICULAR EJECTION FRACTION TECHNIQUE: Standard myocardial SPECT imaging was performed after resting intravenous injection of 10 mCi Tc-56m tetrofosmin. Subsequently, intravenous infusion of Lexiscan was performed under the supervision of the Cardiology staff. At peak effect of the drug, 30 mCi Tc-28m tetrofosmin was injected intravenously and standard myocardial SPECT imaging was performed. Quantitative gated imaging was also performed to evaluate left ventricular wall motion, and estimate left ventricular ejection fraction. COMPARISON:  None. FINDINGS: Perfusion: Moderate to large fixed defect involving the inferior wall and part of the apex consistent with scar. No reversible defects to suggest ischemia. Wall Motion: Wall motion abnormality involving the inferior wall and apex. Moderate left ventricular dilatation. Left Ventricular Ejection Fraction: 42 % End diastolic volume 122 ml End systolic volume 70 ml IMPRESSION: 1. Infarction/scar involving the inferior wall and part of  the apex. No reversible defects to suggest ischemia. 2. Wall motion abnormality involving the inferior wall and part of the apex. Moderate left ventricular dilatation. 3. Left ventricular ejection fraction 42% 4. Non invasive risk stratification*: High risk. *2012 Appropriate Use Criteria for  Coronary Revascularization Focused Update: J Am Coll Cardiol. 2012;59(9):857-881. http://content.[email or web address].com.aspx?articleid=[email or web address] Electronically Signed   By: Rudie Meyer M.D.   On: 06/16/2016 09:20   Dg Chest Port 1 View  Result Date: 06/15/2016 CLINICAL DATA:  Patient admitted with respiratory distress 06/13/2016. EXAM: PORTABLE CHEST 1 VIEW COMPARISON:  Single-view of the chest 06/13/2016. CT chest 12/14/2014. PA and lateral chest 11/29/2014. FINDINGS: There is cardiomegaly. The patient is status post aortic valve repair. Pacing device is in place. Atherosclerosis is noted. Pulmonary edema seen on the most recent examination is markedly improved. No pneumothorax or pleural effusion. IMPRESSION: Markedly improved pulmonary edema. Cardiomegaly. Atherosclerosis. Electronically Signed   By: Drusilla Kanner M.D.   On: 06/15/2016 14:15    Assessment/Plan:  CKD stage 4  Creatinine about 2 baseline  Admitted with congestive heart failure  No evidence of nephrotoxins  Avoid ACE ARB and NSAIDS  No contrast studies for now  HTN  Controlled  Volume great  euvolemic  Maybe the best we can get   Would reinitiate torsemide tomorrow  Anemia no ESA  Secondary HPT stable    LOS: 3 Alanda Colton W @TODAY @11 :45 AM

## 2016-06-17 DIAGNOSIS — J9601 Acute respiratory failure with hypoxia: Secondary | ICD-10-CM

## 2016-06-17 LAB — CBC
HEMATOCRIT: 30.3 % — AB (ref 36.0–46.0)
HEMOGLOBIN: 9.8 g/dL — AB (ref 12.0–15.0)
MCH: 29.8 pg (ref 26.0–34.0)
MCHC: 32.3 g/dL (ref 30.0–36.0)
MCV: 92.1 fL (ref 78.0–100.0)
Platelets: 194 10*3/uL (ref 150–400)
RBC: 3.29 MIL/uL — AB (ref 3.87–5.11)
RDW: 15.3 % (ref 11.5–15.5)
WBC: 10 10*3/uL (ref 4.0–10.5)

## 2016-06-17 LAB — BASIC METABOLIC PANEL
Anion gap: 7 (ref 5–15)
BUN: 48 mg/dL — AB (ref 6–20)
CHLORIDE: 102 mmol/L (ref 101–111)
CO2: 27 mmol/L (ref 22–32)
CREATININE: 2.23 mg/dL — AB (ref 0.44–1.00)
Calcium: 9 mg/dL (ref 8.9–10.3)
GFR calc Af Amer: 22 mL/min — ABNORMAL LOW (ref >60)
GFR calc non Af Amer: 19 mL/min — ABNORMAL LOW (ref >60)
GLUCOSE: 127 mg/dL — AB (ref 65–99)
POTASSIUM: 4.3 mmol/L (ref 3.5–5.1)
Sodium: 136 mmol/L (ref 135–145)

## 2016-06-17 LAB — GLUCOSE, CAPILLARY
GLUCOSE-CAPILLARY: 116 mg/dL — AB (ref 65–99)
GLUCOSE-CAPILLARY: 121 mg/dL — AB (ref 65–99)
GLUCOSE-CAPILLARY: 119 mg/dL — AB (ref 65–99)
Glucose-Capillary: 112 mg/dL — ABNORMAL HIGH (ref 65–99)

## 2016-06-17 LAB — PROTIME-INR
INR: 1.07
PROTHROMBIN TIME: 13.9 s (ref 11.4–15.2)

## 2016-06-17 MED ORDER — ISOSORBIDE MONONITRATE ER 30 MG PO TB24
30.0000 mg | ORAL_TABLET | Freq: Every day | ORAL | Status: DC
  Administered 2016-06-18: 30 mg via ORAL
  Filled 2016-06-17: qty 1

## 2016-06-17 MED ORDER — WARFARIN - PHARMACIST DOSING INPATIENT
Freq: Every day | Status: DC
  Administered 2016-06-18 – 2016-06-23 (×5)

## 2016-06-17 MED ORDER — PREDNISONE 50 MG PO TABS
60.0000 mg | ORAL_TABLET | Freq: Every day | ORAL | Status: DC
  Administered 2016-06-18: 10:00:00 60 mg via ORAL
  Filled 2016-06-17: qty 1

## 2016-06-17 MED ORDER — TORSEMIDE 20 MG PO TABS
100.0000 mg | ORAL_TABLET | Freq: Every day | ORAL | Status: DC
  Administered 2016-06-17 – 2016-06-19 (×3): 100 mg via ORAL
  Filled 2016-06-17 (×3): qty 5

## 2016-06-17 MED ORDER — ISOSORBIDE MONONITRATE ER 30 MG PO TB24
15.0000 mg | ORAL_TABLET | Freq: Every day | ORAL | Status: AC
  Filled 2016-06-17: qty 1

## 2016-06-17 MED ORDER — WARFARIN SODIUM 3 MG PO TABS
1.5000 mg | ORAL_TABLET | Freq: Once | ORAL | Status: DC
  Filled 2016-06-17: qty 0.5

## 2016-06-17 NOTE — Progress Notes (Signed)
ANTICOAGULATION CONSULT NOTE - Follow Up Consult  Pharmacy Consult for warfarin Indication: atrial fibrillation  Allergies  Allergen Reactions  . Nubain [Nalbuphine Hcl] Hives    Went into cardiac arrest   . Codeine Hives and Nausea Only  . Darvocet [Propoxyphene N-Acetaminophen] Nausea Only    Patient Measurements: Height: 4\' 9"  (144.8 cm) Weight: 177 lb 14.4 oz (80.7 kg) (c scale) IBW/kg (Calculated) : 38.6  Vital Signs: Temp: 98 F (36.7 C) (02/27 1100) Temp Source: Oral (02/27 1100) BP: 146/60 (02/27 1100) Pulse Rate: 68 (02/27 1100)  Labs:  Recent Labs  06/14/16 1444 06/15/16 0459 06/16/16 0441 06/17/16 0451  HGB 11.2*  --   --  9.8*  HCT 35.1*  --   --  30.3*  PLT 169  --   --  194  LABPROT  --  19.7* 16.4* 13.9  INR  --  1.65 1.31 1.07  CREATININE 2.03* 2.20* 2.05* 2.23*    Estimated Creatinine Clearance: 17 mL/min (by C-G formula based on SCr of 2.23 mg/dL (H)).  Assessment: 83 yo F on PTA warfarin for history of afib. INR is 1.31. Coumadin was placed on hold last PM pending Lexiscan today to see if cath is needed. No bleeding noted.   PTA dose: 1.5 mg daily  Pharmacy is consulted to restart warfarin. INR is 1.07.  Goal of Therapy:  INR 2-3 Monitor platelets by anticoagulation protocol: Yes   Plan:  Warfarin 1.5mg  tonight x1 Daily INR Monitor s/sx of bleeding  Arlean Hopping. Newman Pies, PharmD, BCPS Clinical Pharmacist [phone] 06/17/2016 2:42 PM

## 2016-06-17 NOTE — Progress Notes (Signed)
[facility] PROGRESS NOTE  [name] EAV:[id number] DOB: 11/24/1932 DOA: 06/13/2016  PCP: Oneal Grout, MD  Brief History/Interval Summary: 83 year old Caucasian female with a past medical history of diastolic and systolic CHF with echocardiogram in 2016 showing EF of 50%, chronic kidney disease stage IV, aortic stenosis, status post TAVR, history of dementia, history of paroxysmal atrial fibrillation on Coumadin who lives in a skilled nursing facility. She was brought in due to respiratory distress. Patient was found to have acute congestive heart failure and was hospitalized for further management. Patient was diuresed. Symptomatically she improved. However, there was a rise in her creatinine. Nephrology was subsequently consulted. She also underwent stress test which was abnormal.  Reason for Visit: Acute systolic CHF  Consultants: Cardiology. Nephrology.  Procedures:  Transthoracic echocardiogram Study Conclusions - Left ventricle: LVEF is approximately 35 to 40% with hypokinesis   of inferior, inferoseptal and anterior walls This is new compared   to echo of 2016. The cavity size was moderately dilated. Wall   thickness was normal. Features are consistent with a pseudonormal   left ventricular filling pattern, with concomitant abnormal   relaxation and increased filling pressure (grade 2 diastolic   dysfunction). Doppler parameters are consistent with high   ventricular filling pressure. - Aortic valve: AV prosthesis is difficult to see Peak and mean   gradients through the valve are 12 and 7 mm Hg respectively.   There is trace valvular AI Valve area (VTI): 1.82 cm^2. Valve   area (Vmax): 1.7 cm^2. Valve area (Vmean): 1.89 cm^2. - Mitral valve: There was mild to moderate regurgitation. - Left atrium: The atrium was mildly dilated. - Pulmonary arteries: PA peak pressure: 39 mm Hg (S).  Nuclear Stress Test IMPRESSION: 1. Infarction/scar involving the inferior  wall and part of the apex. No reversible defects to suggest ischemia. 2. Wall motion abnormality involving the inferior wall and part of the apex. Moderate left ventricular dilatation. 3. Left ventricular ejection fraction 42% 4. Non invasive risk stratification*: High risk.  Antibiotics: None  Subjective/Interval History: Patient feels well. Breathing has significantly improved. She denies any chest pain.   ROS: Denies any nausea or vomiting  Objective:  Vital Signs  Vitals:   06/16/16 0448 06/16/16 1310 06/16/16 1950 06/17/16 0457  BP: 134/64 138/68 (!) 141/59 (!) 153/54  Pulse: 69 72 70 65  Resp: 20 18 18 20   Temp: 98.5 F (36.9 C) 97.8 F (36.6 C) 98.9 F (37.2 C) 98.4 F (36.9 C)  TempSrc: Oral Oral Oral Oral  SpO2: 95% 96% 94% 96%  Weight: 80.1 kg (176 lb 9.6 oz)   80.7 kg (177 lb 14.4 oz)  Height:        Intake/Output Summary (Last 24 hours) at 06/17/16 0957 Last data filed at 06/17/16 0847  Gross per 24 hour  Intake              480 ml  Output              420 ml  Net               60 ml   Filed Weights   06/15/16 0542 06/16/16 0448 06/17/16 0457  Weight: 80.4 kg (177 lb 3.2 oz) 80.1 kg (176 lb 9.6 oz) 80.7 kg (177 lb 14.4 oz)    General appearance: alert, cooperative, appears stated age and no distress Resp: improved air entry with less crackles. No wheezing. No rhonchi. Cardio: regular rate and rhythm, S1, S2 normal,  no murmur, click, rub or gallop GI: soft, non-tender; bowel sounds normal; no masses,  no organomegaly Extremities: Minimal edema bilateral lower extremity Neurologic: Awake and alert. Oriented 3. No focal neurological deficits.  Lab Results:  Data Reviewed: I have personally reviewed following labs and imaging studies  CBC:  Recent Labs Lab 06/13/16 0521 06/14/16 1444 06/17/16 0451  WBC 14.0* 8.9 10.0  NEUTROABS 11.6*  --   --   HGB 11.8* 11.2* 9.8*  HCT 36.9 35.1* 30.3*  MCV 95.1 96.4 92.1  PLT 220 169 194    Basic  Metabolic Panel:  Recent Labs Lab 06/13/16 0521 06/14/16 0244 06/14/16 1444 06/15/16 0459 06/16/16 0441 06/17/16 0451  NA 143 141  --  142 141 136  K 3.8 5.1  --  4.5 4.5 4.3  CL 108 102  --  100* 103 102  CO2 22 31  --  30 28 27   GLUCOSE 166* 98  --  79 138* 127*  BUN 27* 30*  --  39* 36* 48*  CREATININE 1.85* 1.97* 2.03* 2.20* 2.05* 2.23*  CALCIUM 9.2 9.0  --  9.1 9.4 9.0    GFR: Estimated Creatinine Clearance: 17 mL/min (by C-G formula based on SCr of 2.23 mg/dL (H)).  Liver Function Tests:  Recent Labs Lab 06/13/16 0521  AST 26  ALT 14  ALKPHOS 59  BILITOT 0.8  PROT 5.9*  ALBUMIN 3.1*    Coagulation Profile:  Recent Labs Lab 06/13/16 0530 06/14/16 0244 06/15/16 0459 06/16/16 0441 06/17/16 0451  INR 1.68 1.77 1.65 1.31 1.07    CBG:  Recent Labs Lab 06/16/16 0724 06/16/16 1153 06/16/16 1647 06/16/16 2146 06/17/16 0733  GLUCAP 136* 131* 146* 174* 116*     Recent Results (from the past 240 hour(s))  MRSA PCR Screening     Status: Abnormal   Collection Time: 06/13/16 12:21 PM  Result Value Ref Range Status   MRSA by PCR POSITIVE (A) NEGATIVE Final    Comment:        The GeneXpert MRSA Assay (FDA approved for NASAL specimens only), is one component of a comprehensive MRSA colonization surveillance program. It is not intended to diagnose MRSA infection nor to guide or monitor treatment for MRSA infections. RESULT CALLED TO, READ BACK BY AND VERIFIED WITH: Tyler Aas RN 15:05 06/13/16 ([email or web address])       Radiology Studies: Nm Myocar Multi W/spect W/wall Motion / Ef  Result Date: 06/16/2016 CLINICAL DATA:  Chest pain. EXAM: MYOCARDIAL IMAGING WITH SPECT (REST AND PHARMACOLOGIC-STRESS) GATED LEFT VENTRICULAR WALL MOTION STUDY LEFT VENTRICULAR EJECTION FRACTION TECHNIQUE: Standard myocardial SPECT imaging was performed after resting intravenous injection of 10 mCi Tc-40m tetrofosmin. Subsequently, intravenous infusion of Lexiscan was performed  under the supervision of the Cardiology staff. At peak effect of the drug, 30 mCi Tc-38m tetrofosmin was injected intravenously and standard myocardial SPECT imaging was performed. Quantitative gated imaging was also performed to evaluate left ventricular wall motion, and estimate left ventricular ejection fraction. COMPARISON:  None. FINDINGS: Perfusion: Moderate to large fixed defect involving the inferior wall and part of the apex consistent with scar. No reversible defects to suggest ischemia. Wall Motion: Wall motion abnormality involving the inferior wall and apex. Moderate left ventricular dilatation. Left Ventricular Ejection Fraction: 42 % End diastolic volume 122 ml End systolic volume 70 ml IMPRESSION: 1. Infarction/scar involving the inferior wall and part of the apex. No reversible defects to suggest ischemia. 2. Wall motion abnormality involving the inferior wall and part of the  apex. Moderate left ventricular dilatation. 3. Left ventricular ejection fraction 42% 4. Non invasive risk stratification*: High risk. *2012 Appropriate Use Criteria for Coronary Revascularization Focused Update: J Am Coll Cardiol. 2012;59(9):857-881. http://content.[email or web address].aspx?articleid=[email or web address] Electronically Signed   By: Rudie Meyer M.D.   On: 06/16/2016 09:20   Dg Chest Port 1 View  Result Date: 06/15/2016 CLINICAL DATA:  Patient admitted with respiratory distress 06/13/2016. EXAM: PORTABLE CHEST 1 VIEW COMPARISON:  Single-view of the chest 06/13/2016. CT chest 12/14/2014. PA and lateral chest 11/29/2014. FINDINGS: There is cardiomegaly. The patient is status post aortic valve repair. Pacing device is in place. Atherosclerosis is noted. Pulmonary edema seen on the most recent examination is markedly improved. No pneumothorax or pleural effusion. IMPRESSION: Markedly improved pulmonary edema. Cardiomegaly. Atherosclerosis. Electronically Signed   By: Drusilla Kanner M.D.   On: 06/15/2016 14:15      Medications:  Scheduled: . amiodarone  100 mg Oral Daily  . atorvastatin  10 mg Oral q1800  . calcitRIOL  0.25 mcg Oral q morning - 10a  . carvedilol  12.5 mg Oral BID  . Chlorhexidine Gluconate Cloth  6 each Topical Q0600  . cycloSPORINE  1 drop Both Eyes BID  . donepezil  10 mg Oral QHS  . DULoxetine  60 mg Oral QPM  . febuxostat  40 mg Oral QPM  . heparin  5,000 Units Subcutaneous Q8H  . hydrALAZINE  25 mg Oral TID  . insulin aspart  0-15 Units Subcutaneous TID WC  . insulin aspart  0-5 Units Subcutaneous QHS  . isosorbide mononitrate  15 mg Oral Daily  . levothyroxine  75 mcg Oral QAC breakfast  . linaclotide  145 mcg Oral Daily  . loratadine  10 mg Oral Daily  . mouth rinse  15 mL Mouth Rinse BID  . memantine  5 mg Oral BID  . mupirocin ointment  1 application Nasal BID  . MUSCLE RUB   Topical BID  . pantoprazole  40 mg Oral Daily  . predniSONE  40 mg Oral BID WC  . pregabalin  50 mg Oral 3 times per day  . senna  2 tablet Oral BID  . sodium chloride flush  3 mL Intravenous Q12H  . torsemide  100 mg Oral Daily  . Warfarin - Pharmacist Dosing Inpatient   Does not apply q1800   Continuous:  JXB:JYNWGN chloride, acetaminophen, bisacodyl, HYDROcodone-acetaminophen, ipratropium-albuterol, ondansetron (ZOFRAN) IV, simethicone, sodium chloride flush  Assessment/Plan:  Principal Problem:   Acute respiratory failure (HCC) Active Problems:   Paroxysmal atrial fibrillation (HCC)   Hypertension   Acute on chronic systolic and diastolic heart failure, NYHA class 3 (HCC)   Dementia without behavioral disturbance   CKD (chronic kidney disease) stage 4, GFR 15-29 ml/min (HCC)   Elevated blood sugar   Acute on chronic combined systolic and diastolic congestive heart failure (HCC)   Unstable angina (HCC)    Acute respiratory failure with hypoxia Patient initially required BiPAP. She stabilized after she was diuresed. Currently on oxygen by nasal cannula.  Resolved.  Acute systolic CHF Echocardiogram shows that her EF is 35-40%. This is less than what it was in 2016. Wall motion abnormalities also noted. Patient was placed on IV lasix. Cardiology was consulted. Underwent stress test which revealed fixed defects. With rising creatinine lasix was held by cards. Chest x-ray was repeated and revealed improvement in pulmonary edema. Since she continued to wheeze and does have a history of asthma, she was started on nebulizer treatments and steroids, which  seems to have helped. Cutback on steroids. Patient's weight has been stable. However, her urine output was has been low. Nephrology was consulted. Started back on diuretics today. Continue beta blocker. No ACE inhibitor due to renal failure. Continue to monitor ins and outs and daily weights.    Chronic kidney disease, stage IV. Creatinine continues to rise slowly. Urine output is low. Seen by nephrology. Started back on torsemide today. Monitor Urine output. Would like to avoid nephrotoxic agents such as contrast and Ace inhibitors.  History of aortic stenosis, status post TAVR Prosthetic valve seen on echocardiogram. Gradients could not be measured. Further management per cardiology.  History of paroxysmal atrial fibrillation Chads2vascular score is 6. Patient is on warfarin at home. Warfarin is on hold for possible procedure. Patient is also on amiodarone as well as beta blocker, which will also be continued.  History of essential hypertension. Monitor blood pressures. Continue home medications.  History of dementia. Continue Namenda and Aricept. Mental status appears to be close to baseline. No neurological deficits.  History of hypothyroidism. TSh is normal but FT4 was high. Dose of levothyroxine reduced to from .  Normocytic anemia Drop in Hemoglobin noted. No overt bleeding. Could be dilutional. Continue to monitor.   DVT Prophylaxis: On warfarin    Code Status: Full code   Family Communication: Discussed with the patient. Discussed with her daughter over the phone. Disposition Plan: Management as above. Await further cardiology input. Anticipate discharge back to skilled nursing facility in 1-2 days.    LOS: 4 days   [facility]  [facility] Pager [phone] 06/17/2016, 9:57 AM  If 7PM-7AM, please contact night-coverage at [email or web address], password [facility]

## 2016-06-17 NOTE — Progress Notes (Signed)
Progress Note  Patient Name: [name] Date of Encounter: 06/17/2016  Primary Cardiologist: Dr. Excell Seltzer EP Dr. Ladona Ridgel  Subjective   Breathing, wheezing and edema have improved since admission. The patient is still having intermittent mild chest pressure lasting for 15-20 minutes at a time, slightly better with addition of Imdur. She still has mild orthopnea.  Inpatient Medications    Scheduled Meds: . amiodarone  100 mg Oral Daily  . atorvastatin  10 mg Oral q1800  . calcitRIOL  0.25 mcg Oral q morning - 10a  . carvedilol  12.5 mg Oral BID  . Chlorhexidine Gluconate Cloth  6 each Topical Q0600  . cycloSPORINE  1 drop Both Eyes BID  . donepezil  10 mg Oral QHS  . DULoxetine  60 mg Oral QPM  . febuxostat  40 mg Oral QPM  . heparin  5,000 Units Subcutaneous Q8H  . hydrALAZINE  25 mg Oral TID  . insulin aspart  0-15 Units Subcutaneous TID WC  . insulin aspart  0-5 Units Subcutaneous QHS  . isosorbide mononitrate  15 mg Oral Daily  . levothyroxine  75 mcg Oral QAC breakfast  . linaclotide  145 mcg Oral Daily  . loratadine  10 mg Oral Daily  . mouth rinse  15 mL Mouth Rinse BID  . memantine  5 mg Oral BID  . mupirocin ointment  1 application Nasal BID  . MUSCLE RUB   Topical BID  . pantoprazole  40 mg Oral Daily  . predniSONE  40 mg Oral BID WC  . pregabalin  50 mg Oral 3 times per day  . senna  2 tablet Oral BID  . sodium chloride flush  3 mL Intravenous Q12H  . torsemide  100 mg Oral Daily  . [START ON 06/18/2016] Warfarin - Pharmacist Dosing Inpatient   Does not apply q1800   Continuous Infusions:  PRN Meds: sodium chloride, acetaminophen, bisacodyl, HYDROcodone-acetaminophen, ipratropium-albuterol, ondansetron (ZOFRAN) IV, simethicone, sodium chloride flush   Vital Signs    Vitals:   06/16/16 0448 06/16/16 1310 06/16/16 1950 06/17/16 0457  BP: 134/64 138/68 (!) 141/59 (!) 153/54  Pulse: 69 72 70 65  Resp: 20 18 18 20   Temp: 98.5 F (36.9 C) 97.8 F (36.6  C) 98.9 F (37.2 C) 98.4 F (36.9 C)  TempSrc: Oral Oral Oral Oral  SpO2: 95% 96% 94% 96%  Weight: 176 lb 9.6 oz (80.1 kg)   177 lb 14.4 oz (80.7 kg)  Height:        Intake/Output Summary (Last 24 hours) at 06/17/16 1043 Last data filed at 06/17/16 0847  Gross per 24 hour  Intake              480 ml  Output              420 ml  Net               60 ml   Filed Weights   06/15/16 0542 06/16/16 0448 06/17/16 0457  Weight: 177 lb 3.2 oz (80.4 kg) 176 lb 9.6 oz (80.1 kg) 177 lb 14.4 oz (80.7 kg)    Telemetry    Sinus rhythm with occ A pacing and occ PVC's- Personally Reviewed  ECG    No new   Physical Exam   GEN: No acute distress.   Neck: No JVD Cardiac: RRR, no murmurs, rubs, or gallops.  Respiratory: Lungs clear bilateraly. No rales GI: Soft, nontender, non-distended  MS: No edema; No deformity.  Neuro:  Nonfocal  Psych: Normal affect   Labs    Chemistry  Recent Labs Lab 06/13/16 0521  06/15/16 0459 06/16/16 0441 06/17/16 0451  NA 143  < > 142 141 136  K 3.8  < > 4.5 4.5 4.3  CL 108  < > 100* 103 102  CO2 22  < > 30 28 27   GLUCOSE 166*  < > 79 138* 127*  BUN 27*  < > 39* 36* 48*  CREATININE 1.85*  < > 2.20* 2.05* 2.23*  CALCIUM 9.2  < > 9.1 9.4 9.0  PROT 5.9*  --   --   --   --   ALBUMIN 3.1*  --   --   --   --   AST 26  --   --   --   --   ALT 14  --   --   --   --   ALKPHOS 59  --   --   --   --   BILITOT 0.8  --   --   --   --   GFRNONAA 24*  < > 20* 21* 19*  GFRAA 28*  < > 23* 25* 22*  ANIONGAP 13  < > 12 10 7   < > = values in this interval not displayed.   Hematology  Recent Labs Lab 06/13/16 0521 06/14/16 1444 06/17/16 0451  WBC 14.0* 8.9 10.0  RBC 3.88 3.64* 3.29*  HGB 11.8* 11.2* 9.8*  HCT 36.9 35.1* 30.3*  MCV 95.1 96.4 92.1  MCH 30.4 30.8 29.8  MCHC 32.0 31.9 32.3  RDW 16.0* 15.8* 15.3  PLT 220 169 194    Cardiac EnzymesNo results for input(s): TROPONINI in the last 168 hours.   Recent Labs Lab 06/13/16 0513  TROPIPOC  0.06     BNP  Recent Labs Lab 06/13/16 0530  BNP 528.3*     DDimer No results for input(s): DDIMER in the last 168 hours.   Radiology    Nm Myocar Multi W/spect W/wall Motion / Ef  Result Date: 06/16/2016 CLINICAL DATA:  Chest pain. EXAM: MYOCARDIAL IMAGING WITH SPECT (REST AND PHARMACOLOGIC-STRESS) GATED LEFT VENTRICULAR WALL MOTION STUDY LEFT VENTRICULAR EJECTION FRACTION TECHNIQUE: Standard myocardial SPECT imaging was performed after resting intravenous injection of 10 mCi Tc-67m tetrofosmin. Subsequently, intravenous infusion of Lexiscan was performed under the supervision of the Cardiology staff. At peak effect of the drug, 30 mCi Tc-56m tetrofosmin was injected intravenously and standard myocardial SPECT imaging was performed. Quantitative gated imaging was also performed to evaluate left ventricular wall motion, and estimate left ventricular ejection fraction. COMPARISON:  None. FINDINGS: Perfusion: Moderate to large fixed defect involving the inferior wall and part of the apex consistent with scar. No reversible defects to suggest ischemia. Wall Motion: Wall motion abnormality involving the inferior wall and apex. Moderate left ventricular dilatation. Left Ventricular Ejection Fraction: 42 % End diastolic volume 122 ml End systolic volume 70 ml IMPRESSION: 1. Infarction/scar involving the inferior wall and part of the apex. No reversible defects to suggest ischemia. 2. Wall motion abnormality involving the inferior wall and part of the apex. Moderate left ventricular dilatation. 3. Left ventricular ejection fraction 42% 4. Non invasive risk stratification*: High risk. *2012 Appropriate Use Criteria for Coronary Revascularization Focused Update: J Am Coll Cardiol. 2012;59(9):857-881. http://content.dementiazones.com.aspx?articleid=[email or web address] Electronically Signed   By: Rudie Meyer M.D.   On: 06/16/2016 09:20   Dg Chest Port 1 View  Result Date: 06/15/2016 CLINICAL DATA:  Patient  admitted  with respiratory distress 06/13/2016. EXAM: PORTABLE CHEST 1 VIEW COMPARISON:  Single-view of the chest 06/13/2016. CT chest 12/14/2014. PA and lateral chest 11/29/2014. FINDINGS: There is cardiomegaly. The patient is status post aortic valve repair. Pacing device is in place. Atherosclerosis is noted. Pulmonary edema seen on the most recent examination is markedly improved. No pneumothorax or pleural effusion. IMPRESSION: Markedly improved pulmonary edema. Cardiomegaly. Atherosclerosis. Electronically Signed   By: Drusilla Kanner M.D.   On: 06/15/2016 14:15    Cardiac Studies   Nuclear stress test 06/15/16 IMPRESSION:  1. Infarction/scar involving the inferior wall and part of the apex. No reversible defects to suggest ischemia. 2. Wall motion abnormality involving the inferior wall and part of the apex. Moderate left ventricular dilatation. 3. Left ventricular ejection fraction 42% 4. Non invasive risk stratification*: High risk.  TTE Study Conclusions  - Left ventricle: LVEF is approximately 35 to 40% with hypokinesis   of inferior, inferoseptal and anterior walls This is new compared   to echo of 2016. The cavity size was moderately dilated. Wall   thickness was normal. Features are consistent with a pseudonormal   left ventricular filling pattern, with concomitant abnormal   relaxation and increased filling pressure (grade 2 diastolic   dysfunction). Doppler parameters are consistent with high   ventricular filling pressure. - Aortic valve: AV prosthesis is difficult to see Peak and mean   gradients through the valve are 12 and 7 mm Hg respectively.   There is trace valvular AI Valve area (VTI): 1.82 cm^2. Valve   area (Vmax): 1.7 cm^2. Valve area (Vmean): 1.89 cm^2. - Mitral valve: There was mild to moderate regurgitation. - Left atrium: The atrium was mildly dilated. - Pulmonary arteries: PA peak pressure: 39 mm Hg (S).   Patient Profile     83 y.o. female with  hx of cardiomyopathy, chronic systolic heart failure, and severe aortic stenosis. She underwent TAVR 12/21/2012 with a 29 mm Edwards Sapien XT valve. Other cardiac related problems include atrial fibrillation and hypertension. The patient has been chronically anticoagulated with warfarin. The patient has undergone pacemaker placement and she is followed by Dr. Ladona Ridgel.  Last seen by Dr. Excell Seltzer 01/2014.   Other hx includes, dementia, chronic pain, lives at [facility].   Now with Heart failure.  Assessment & Plan    Acute on chronic systolic and diastolic heart failure -BNP 528, 2/23 -EF 35-40%. With wall motion abnormality -CXR yesterday showed markedly improved pulmonary edema -Improved, on lasix 80 mg every 12 hours- now held due to worsening renal function.  -Was diuresingwith 2l removal since admission. Without lasix diuresis has trailed off. Weight is stable.  -Clinically looks euvolemic. Edema is resolved. Lungs without rales. DOE and orthopnea much improved since admission. -Continue coreg and hydralazine.  Chest pain  -may be due to volume overload.  Non obstructive disease in 2014.    -Nuclear stress test 06/15/16 showed Infarction/scar involving the inferior wall and part of the apex. No reversible defects to suggest ischemia. Wall motion abnormality involving the inferior wall and part of the apex. Moderate left ventricular dilatation. LVEF 42%. High risk study. -DOE is improved but still present. Has mild intermittent chest pressure.  -Would watch creatinine off diuretic (last dose of lasix 2/24 am) and consider cardiac cath if creatinine normalizes. Otherwise since symptoms are improving can treat medically for now and follow up outpatient with plan for cath in future if needed -isosorbide added with some improvement. Will increase dose. BP stable.  PAF  -maintaining SR on amiodarone and coreg -CHA2DS2VASc score of 8, is on coumadin INR 1.77  Pharmacy following.  Currently on  hold in case of cardiac cath. INR today is 1.31  Hx of TAVR 2014.   -see Echo    CKD 4   -Cr increased 1.97-->2.03-->2.20-->2.05 to 2.23 today, lasix on hold -Appreciate nephrology input. Will continue to hold ACE-I/ARB and lasix.   Signed, Berton Bon, NP  06/17/2016, 10:43 AM    Patient seen and examined. Agree with assessment and plan. Feels better. Now on empiric nitrates with Imdur 30 mg. Can increase to 60 mg with recurrent symptoms. Pt apparently was re-started on torsemide today at 100 mg per Dr. Hyman Hopes.  Cr  2.23 today. If no plan for cath, probably can resume coumadin. Consider amlodipine if additional Rx for BP needed with anti-ischemic benefit.    Lennette Bihari, MD, [facility] 06/17/2016 2:26 PM

## 2016-06-17 NOTE — Progress Notes (Signed)
[facility] ROUNDING NOTE   Subjective:   Interval History:Jodi B Mitchellis a 83 y.o.femalewith a history of diastolic heart failure grade 1 an echocardiogram on 02/2015, CK D stage IV, aortic stenosis, dementia, chronic pain, hypertension, paroxysmal atrial fibrillation with chads 2 vasc score of 6 on coumadin. Patient is resident of [facility] health center presents with onset of respiratory distress. She was found to be in respiratory distress by nursing home staff, was brought to the emergency room for evaluation. Patient has dementia and is unable to provide history.  Baseline creatinine 2   Good response to diuretics    Creatinine about at baseline  Concern regarding appropriate dosing of diuretics   Objective:  Vital signs in last 24 hours:  Temp:  [97.8 F (36.6 C)-98.9 F (37.2 C)] 98.4 F (36.9 C) (02/27 0457) Pulse Rate:  [65-72] 65 (02/27 0457) Resp:  [18-20] 20 (02/27 0457) BP: (138-153)/(54-68) 153/54 (02/27 0457) SpO2:  [94 %-96 %] 96 % (02/27 0457) Weight:  [80.7 kg (177 lb 14.4 oz)] 80.7 kg (177 lb 14.4 oz) (02/27 0457)  Weight change: 0.59 kg (1 lb 4.8 oz) Filed Weights   06/15/16 0542 06/16/16 0448 06/17/16 0457  Weight: 80.4 kg (177 lb 3.2 oz) 80.1 kg (176 lb 9.6 oz) 80.7 kg (177 lb 14.4 oz)    Intake/Output: I/O last 3 completed shifts: In: 1115 [P.O.:1115] Out: 945 [Urine:945]   Intake/Output this shift:  Total I/O In: 240 [P.O.:240] Out: -   CVS- RRR RS- CTA ABD- BS present soft non-distended EXT- no edema   Basic Metabolic Panel:  Recent Labs Lab 06/13/16 0521 06/14/16 0244 06/14/16 1444 06/15/16 0459 06/16/16 0441 06/17/16 0451  NA 143 141  --  142 141 136  K 3.8 5.1  --  4.5 4.5 4.3  CL 108 102  --  100* 103 102  CO2 22 31  --  30 28 27   GLUCOSE 166* 98  --  79 138* 127*  BUN 27* 30*  --  39* 36* 48*  CREATININE 1.85* 1.97* 2.03* 2.20* 2.05* 2.23*  CALCIUM 9.2 9.0  --  9.1 9.4 9.0    Liver Function  Tests:  Recent Labs Lab 06/13/16 0521  AST 26  ALT 14  ALKPHOS 59  BILITOT 0.8  PROT 5.9*  ALBUMIN 3.1*   No results for input(s): LIPASE, AMYLASE in the last 168 hours. No results for input(s): AMMONIA in the last 168 hours.  CBC:  Recent Labs Lab 06/13/16 0521 06/14/16 1444 06/17/16 0451  WBC 14.0* 8.9 10.0  NEUTROABS 11.6*  --   --   HGB 11.8* 11.2* 9.8*  HCT 36.9 35.1* 30.3*  MCV 95.1 96.4 92.1  PLT 220 169 194    Cardiac Enzymes: No results for input(s): CKTOTAL, CKMB, CKMBINDEX, TROPONINI in the last 168 hours.  BNP: Invalid input(s): POCBNP  CBG:  Recent Labs Lab 06/16/16 0724 06/16/16 1153 06/16/16 1647 06/16/16 2146 06/17/16 0733  GLUCAP 136* 131* 146* 174* 116*    Microbiology: Results for orders placed or performed during the hospital encounter of 06/13/16  MRSA PCR Screening     Status: Abnormal   Collection Time: 06/13/16 12:21 PM  Result Value Ref Range Status   MRSA by PCR POSITIVE (A) NEGATIVE Final    Comment:        The GeneXpert MRSA Assay (FDA approved for NASAL specimens only), is one component of a comprehensive MRSA colonization surveillance program. It is not intended to diagnose MRSA infection  nor to guide or monitor treatment for MRSA infections. RESULT CALLED TO, READ BACK BY AND VERIFIED WITH: Tyler Aas RN 15:05 06/13/16 ([email or web address])     Coagulation Studies:  Recent Labs  06/15/16 0459 06/16/16 0441 06/17/16 0451  LABPROT 19.7* 16.4* 13.9  INR 1.65 1.31 1.07    Urinalysis: No results for input(s): COLORURINE, LABSPEC, PHURINE, GLUCOSEU, HGBUR, BILIRUBINUR, KETONESUR, PROTEINUR, UROBILINOGEN, NITRITE, LEUKOCYTESUR in the last 72 hours.  Invalid input(s): APPERANCEUR    Imaging: Nm Myocar Multi W/spect W/wall Motion / Ef  Result Date: 06/16/2016 CLINICAL DATA:  Chest pain. EXAM: MYOCARDIAL IMAGING WITH SPECT (REST AND PHARMACOLOGIC-STRESS) GATED LEFT VENTRICULAR WALL MOTION STUDY LEFT VENTRICULAR EJECTION  FRACTION TECHNIQUE: Standard myocardial SPECT imaging was performed after resting intravenous injection of 10 mCi Tc-31m tetrofosmin. Subsequently, intravenous infusion of Lexiscan was performed under the supervision of the Cardiology staff. At peak effect of the drug, 30 mCi Tc-49m tetrofosmin was injected intravenously and standard myocardial SPECT imaging was performed. Quantitative gated imaging was also performed to evaluate left ventricular wall motion, and estimate left ventricular ejection fraction. COMPARISON:  None. FINDINGS: Perfusion: Moderate to large fixed defect involving the inferior wall and part of the apex consistent with scar. No reversible defects to suggest ischemia. Wall Motion: Wall motion abnormality involving the inferior wall and apex. Moderate left ventricular dilatation. Left Ventricular Ejection Fraction: 42 % End diastolic volume 122 ml End systolic volume 70 ml IMPRESSION: 1. Infarction/scar involving the inferior wall and part of the apex. No reversible defects to suggest ischemia. 2. Wall motion abnormality involving the inferior wall and part of the apex. Moderate left ventricular dilatation. 3. Left ventricular ejection fraction 42% 4. Non invasive risk stratification*: High risk. *2012 Appropriate Use Criteria for Coronary Revascularization Focused Update: J Am Coll Cardiol. 2012;59(9):857-881. http://content.dementiazones.com.aspx?articleid=[email or web address] Electronically Signed   By: Rudie Meyer M.D.   On: 06/16/2016 09:20   Dg Chest Port 1 View  Result Date: 06/15/2016 CLINICAL DATA:  Patient admitted with respiratory distress 06/13/2016. EXAM: PORTABLE CHEST 1 VIEW COMPARISON:  Single-view of the chest 06/13/2016. CT chest 12/14/2014. PA and lateral chest 11/29/2014. FINDINGS: There is cardiomegaly. The patient is status post aortic valve repair. Pacing device is in place. Atherosclerosis is noted. Pulmonary edema seen on the most recent examination is markedly improved. No  pneumothorax or pleural effusion. IMPRESSION: Markedly improved pulmonary edema. Cardiomegaly. Atherosclerosis. Electronically Signed   By: Drusilla Kanner M.D.   On: 06/15/2016 14:15     Medications:    . amiodarone  100 mg Oral Daily  . atorvastatin  10 mg Oral q1800  . calcitRIOL  0.25 mcg Oral q morning - 10a  . carvedilol  12.5 mg Oral BID  . Chlorhexidine Gluconate Cloth  6 each Topical Q0600  . cycloSPORINE  1 drop Both Eyes BID  . donepezil  10 mg Oral QHS  . DULoxetine  60 mg Oral QPM  . febuxostat  40 mg Oral QPM  . heparin  5,000 Units Subcutaneous Q8H  . hydrALAZINE  25 mg Oral TID  . insulin aspart  0-15 Units Subcutaneous TID WC  . insulin aspart  0-5 Units Subcutaneous QHS  . isosorbide mononitrate  15 mg Oral Daily  . levothyroxine  75 mcg Oral QAC breakfast  . linaclotide  145 mcg Oral Daily  . loratadine  10 mg Oral Daily  . mouth rinse  15 mL Mouth Rinse BID  . memantine  5 mg Oral BID  . mupirocin ointment  1 application Nasal  BID  . MUSCLE RUB   Topical BID  . pantoprazole  40 mg Oral Daily  . predniSONE  40 mg Oral BID WC  . pregabalin  50 mg Oral 3 times per day  . senna  2 tablet Oral BID  . sodium chloride flush  3 mL Intravenous Q12H  . Warfarin - Pharmacist Dosing Inpatient   Does not apply q1800   sodium chloride, acetaminophen, bisacodyl, HYDROcodone-acetaminophen, ipratropium-albuterol, ondansetron (ZOFRAN) IV, simethicone, sodium chloride flush  Assessment/ Plan:   CKD stage 4  Creatinine about 2 baseline  Admitted with congestive heart failure  No evidence of nephrotoxins  Avoid ACE ARB and NSAIDS  No contrast studies for now  HTN  Controlled  Volume great  euvolemic  Maybe the best we can get   Would reinitiate torsemide  Anemia no ESA  Secondary HPT stable    LOS: 4 Elias Bordner W @TODAY @9 :26 AM

## 2016-06-18 DIAGNOSIS — F0280 Dementia in other diseases classified elsewhere without behavioral disturbance: Secondary | ICD-10-CM

## 2016-06-18 LAB — GLUCOSE, CAPILLARY
GLUCOSE-CAPILLARY: 100 mg/dL — AB (ref 65–99)
GLUCOSE-CAPILLARY: 137 mg/dL — AB (ref 65–99)
Glucose-Capillary: 107 mg/dL — ABNORMAL HIGH (ref 65–99)
Glucose-Capillary: 179 mg/dL — ABNORMAL HIGH (ref 65–99)

## 2016-06-18 LAB — BASIC METABOLIC PANEL
ANION GAP: 7 (ref 5–15)
BUN: 55 mg/dL — ABNORMAL HIGH (ref 6–20)
CALCIUM: 8.7 mg/dL — AB (ref 8.9–10.3)
CO2: 30 mmol/L (ref 22–32)
Chloride: 100 mmol/L — ABNORMAL LOW (ref 101–111)
Creatinine, Ser: 2.28 mg/dL — ABNORMAL HIGH (ref 0.44–1.00)
GFR, EST AFRICAN AMERICAN: 22 mL/min — AB (ref >60)
GFR, EST NON AFRICAN AMERICAN: 19 mL/min — AB (ref >60)
GLUCOSE: 95 mg/dL (ref 65–99)
POTASSIUM: 3.7 mmol/L (ref 3.5–5.1)
SODIUM: 137 mmol/L (ref 135–145)

## 2016-06-18 LAB — PROTIME-INR
INR: 1.16
PROTHROMBIN TIME: 14.9 s (ref 11.4–15.2)

## 2016-06-18 MED ORDER — ISOSORBIDE MONONITRATE ER 60 MG PO TB24
60.0000 mg | ORAL_TABLET | Freq: Every day | ORAL | Status: DC
  Administered 2016-06-19 – 2016-06-25 (×7): 60 mg via ORAL
  Filled 2016-06-18 (×7): qty 1

## 2016-06-18 MED ORDER — WARFARIN SODIUM 3 MG PO TABS
3.0000 mg | ORAL_TABLET | Freq: Once | ORAL | Status: AC
  Administered 2016-06-18: 3 mg via ORAL
  Filled 2016-06-18: qty 1

## 2016-06-18 NOTE — Progress Notes (Signed)
Progress Note  Patient Name: [name] Date of Encounter: 06/18/2016  Primary Cardiologist: Dr. Excell Seltzer EP Dr. Ladona Ridgel  Subjective   The patient is still having intermittent mild chest pressure lasting for 15-20 minutes at a time, slightly better with addition of Imdur. Her breathing has steadily improved since admission.   Inpatient Medications    Scheduled Meds: . amiodarone  100 mg Oral Daily  . atorvastatin  10 mg Oral q1800  . calcitRIOL  0.25 mcg Oral q morning - 10a  . carvedilol  12.5 mg Oral BID  . cycloSPORINE  1 drop Both Eyes BID  . donepezil  10 mg Oral QHS  . DULoxetine  60 mg Oral QPM  . febuxostat  40 mg Oral QPM  . heparin  5,000 Units Subcutaneous Q8H  . hydrALAZINE  25 mg Oral TID  . insulin aspart  0-15 Units Subcutaneous TID WC  . insulin aspart  0-5 Units Subcutaneous QHS  . isosorbide mononitrate  15 mg Oral Daily  . isosorbide mononitrate  30 mg Oral Daily  . levothyroxine  75 mcg Oral QAC breakfast  . linaclotide  145 mcg Oral Daily  . loratadine  10 mg Oral Daily  . mouth rinse  15 mL Mouth Rinse BID  . memantine  5 mg Oral BID  . mupirocin ointment  1 application Nasal BID  . MUSCLE RUB   Topical BID  . pantoprazole  40 mg Oral Daily  . predniSONE  60 mg Oral Q breakfast  . pregabalin  50 mg Oral 3 times per day  . senna  2 tablet Oral BID  . sodium chloride flush  3 mL Intravenous Q12H  . torsemide  100 mg Oral Daily  . warfarin  1.5 mg Oral ONCE-1800  . Warfarin - Pharmacist Dosing Inpatient   Does not apply q1800   Continuous Infusions:  PRN Meds: sodium chloride, acetaminophen, bisacodyl, HYDROcodone-acetaminophen, ipratropium-albuterol, ondansetron (ZOFRAN) IV, simethicone, sodium chloride flush   Vital Signs    Vitals:   06/17/16 0457 06/17/16 1100 06/17/16 2037 06/18/16 0424  BP: (!) 153/54 (!) 146/60 (!) 154/54 (!) 157/52  Pulse: 65 68 62 65  Resp: 20 19 19 19   Temp: 98.4 F (36.9 C) 98 F (36.7 C) 98.4 F (36.9 C)  98.2 F (36.8 C)  TempSrc: Oral Oral Oral Oral  SpO2: 96% 97% 96% 98%  Weight: 177 lb 14.4 oz (80.7 kg)   177 lb 3.2 oz (80.4 kg)  Height:        Intake/Output Summary (Last 24 hours) at 06/18/16 0941 Last data filed at 06/18/16 0900  Gross per 24 hour  Intake              720 ml  Output             2570 ml  Net            -1850 ml   Filed Weights   06/16/16 0448 06/17/16 0457 06/18/16 0424  Weight: 176 lb 9.6 oz (80.1 kg) 177 lb 14.4 oz (80.7 kg) 177 lb 3.2 oz (80.4 kg)    Telemetry    Predominantly A pacing - Personally Reviewed  ECG    No new   Physical Exam   GEN: No acute distress.   Neck: No JVD Cardiac: RRR, no murmurs, rubs, or gallops.  Respiratory: Lungs clear bilateraly. No rales GI: Soft, nontender, non-distended  MS: No edema; No deformity. Neuro:  Nonfocal  Psych: Normal affect  Labs    Chemistry  Recent Labs Lab 06/13/16 [phone]  06/16/16 0441 06/17/16 0451 06/18/16 0237  NA 143  < > 141 136 137  K 3.8  < > 4.5 4.3 3.7  CL 108  < > 103 102 100*  CO2 22  < > 28 27 30   GLUCOSE 166*  < > 138* 127* 95  BUN 27*  < > 36* 48* 55*  CREATININE 1.85*  < > 2.05* 2.23* 2.28*  CALCIUM 9.2  < > 9.4 9.0 8.7*  PROT 5.9*  --   --   --   --   ALBUMIN 3.1*  --   --   --   --   AST 26  --   --   --   --   ALT 14  --   --   --   --   ALKPHOS 59  --   --   --   --   BILITOT 0.8  --   --   --   --   GFRNONAA 24*  < > 21* 19* 19*  GFRAA 28*  < > 25* 22* 22*  ANIONGAP 13  < > 10 7 7   < > = values in this interval not displayed.   Hematology  Recent Labs Lab 06/13/16 0521 06/14/16 1444 06/17/16 0451  WBC 14.0* 8.9 10.0  RBC 3.88 3.64* 3.29*  HGB 11.8* 11.2* 9.8*  HCT 36.9 35.1* 30.3*  MCV 95.1 96.4 92.1  MCH 30.4 30.8 29.8  MCHC 32.0 31.9 32.3  RDW 16.0* 15.8* 15.3  PLT 220 169 194    Cardiac EnzymesNo results for input(s): TROPONINI in the last 168 hours.   Recent Labs Lab 06/13/16 0513  TROPIPOC 0.06     BNP  Recent Labs Lab  06/13/16 0530  BNP 528.3*     DDimer No results for input(s): DDIMER in the last 168 hours.   Radiology    No results found.  Cardiac Studies   Nuclear stress test 06/15/16 IMPRESSION:  1. Infarction/scar involving the inferior wall and part of the apex. No reversible defects to suggest ischemia. 2. Wall motion abnormality involving the inferior wall and part of the apex. Moderate left ventricular dilatation. 3. Left ventricular ejection fraction 42% 4. Non invasive risk stratification*: High risk.  TTE Study Conclusions  - Left ventricle: LVEF is approximately 35 to 40% with hypokinesis   of inferior, inferoseptal and anterior walls This is new compared   to echo of 2016. The cavity size was moderately dilated. Wall   thickness was normal. Features are consistent with a pseudonormal   left ventricular filling pattern, with concomitant abnormal   relaxation and increased filling pressure (grade 2 diastolic   dysfunction). Doppler parameters are consistent with high   ventricular filling pressure. - Aortic valve: AV prosthesis is difficult to see Peak and mean   gradients through the valve are 12 and 7 mm Hg respectively.   There is trace valvular AI Valve area (VTI): 1.82 cm^2. Valve   area (Vmax): 1.7 cm^2. Valve area (Vmean): 1.89 cm^2. - Mitral valve: There was mild to moderate regurgitation. - Left atrium: The atrium was mildly dilated. - Pulmonary arteries: PA peak pressure: 39 mm Hg (S).   Patient Profile     83 y.o. female with hx of cardiomyopathy, chronic systolic heart failure, and severe aortic stenosis. She underwent TAVR 12/21/2012 with a 29 mm Edwards Sapien XT valve. Other cardiac related problems include atrial fibrillation and  hypertension. The patient has been chronically anticoagulated with warfarin. The patient has undergone pacemaker placement and she is followed by Dr. Ladona Ridgel.  Last seen by Dr. Excell Seltzer 01/2014.   Other hx includes, dementia, chronic  pain, lives at [facility].   Now with Heart failure.  Assessment & Plan    Acute on chronic systolic and diastolic heart failure -BNP 528, 2/23 -EF 35-40%. With wall motion abnormality -CXR 2/25 showed markedly improved pulmonary edema -Improved, on lasix 80 mg every 12 hours- stopped due to renal function.   -Was diuresingwith 2l removal since admission. Without lasix diuresis has trailed off. Weight is stable.  -Clinically looks euvolemic. Edema is resolved. Lungs without rales. DOE and orthopnea much improved since admission. -Continue coreg and hydralazine. -Nephrology consulted, pt is at baseline, torsemide resumed  Chest pain  -may be due to volume overload.  Non obstructive disease in 2014.    -Nuclear stress test 06/15/16 showed Infarction/scar involving the inferior wall and part of the apex. No reversible defects to suggest ischemia. Wall motion abnormality involving the inferior wall and part of the apex. Moderate left ventricular dilatation. LVEF 42%. High risk study. -DOE is improved but still present. Has mild intermittent chest pressure.  -Since symptoms are improving can treat medically for now and follow up outpatient with plan for cath in future if needed -isosorbide added with some improvement. BP stable.  PAF  -maintaining SR on amiodarone and coreg -CHA2DS2VASc score of 8, is on coumadin. Coumadin resumed with no plan for cath. Pharmacy following.  INR today is 1.16  Hx of TAVR 2014.   -see Echo    CKD Stage 4   -Baseline SCr about 2 -Cr 2.28. Demadex resumed per Dr. Hyman Hopes, nephrology. -Appreciate nephrology input. Will continue to hold ACE-I/ARB and lasix. No contrast studies for now.   Possible discharge to SNF today. Will discuss with Dr. Tresa Endo.   SignedBerton Bon, NP  06/18/2016, 9:41 AM   Pager: [phone]  Patient seen and examined. Agree with assessment and plan. Cr 2.28 today.  Torsemide was resumed at high dose 100 mg per renal.  ?  Needs dose reduction. Now back on coumadin. With mild recurrent chest discomfort, will titrate isosorbide to 60 mg for treatment of her CAD. Currently on coreg 12.5 mg bid of coreg with pulse in the 60s. Would keep today.   Lennette Bihari, MD, [facility] 06/18/2016 10:36 AM

## 2016-06-18 NOTE — Progress Notes (Addendum)
ANTICOAGULATION CONSULT NOTE - Follow Up Consult  Pharmacy Consult for warfarin Indication: atrial fibrillation  Allergies  Allergen Reactions  . Nubain [Nalbuphine Hcl] Hives    Went into cardiac arrest   . Codeine Hives and Nausea Only  . Darvocet [Propoxyphene N-Acetaminophen] Nausea Only    Patient Measurements: Height: 4\' 9"  (144.8 cm) Weight: 177 lb 3.2 oz (80.4 kg) IBW/kg (Calculated) : 38.6  Vital Signs: Temp: 98.2 F (36.8 C) (02/28 0424) Temp Source: Oral (02/28 0424) BP: 157/52 (02/28 0424) Pulse Rate: 65 (02/28 0424)  Labs:  Recent Labs  06/16/16 0441 06/17/16 0451 06/18/16 0237  HGB  --  9.8*  --   HCT  --  30.3*  --   PLT  --  194  --   LABPROT 16.4* 13.9 14.9  INR 1.31 1.07 1.16  CREATININE 2.05* 2.23* 2.28*    Estimated Creatinine Clearance: 16.6 mL/min (by C-G formula based on SCr of 2.28 mg/dL (H)).  Assessment: 83 yo F on PTA warfarin for history of afib.   INR is 1.1. No bleeding noted. Warfarin was being held for cath but plans have been changed and she will not go to cath so will resume warfarin tonight.   PTA dose: 1.5 mg daily  Goal of Therapy:  INR 2-3 Monitor platelets by anticoagulation protocol: Yes   Plan:  Warfarin 3mg  tonight x1 Daily INR Monitor s/sx of bleeding Continue sq heparin for dvt px  If discharged, would give higher dose (3mg ) of warfarin for 2 days then resume home dose.  Sheppard Coil PharmD., BCPS Clinical Pharmacist Pager [phone] 06/18/2016 10:27 AM

## 2016-06-18 NOTE — Clinical Social Work Note (Addendum)
CSW notified [facility] that patient will likely discharge back to them today.  Charlynn Court, CSW [phone]  11:38 am Per MD, cardiology wants to watch patient for another 24 hours. SNF notified that patient will discharge tomorrow.  Charlynn Court, CSW [phone]

## 2016-06-18 NOTE — Progress Notes (Signed)
Patient ID: [name], female   DOB: December 08, 1932, 83 y.o.   MRN: [id number]  PROGRESS NOTE    JERSEE WINIARSKI  WUJ:[id number] DOB: [birth year] DOA: 06/13/2016  PCP: Oneal Grout, MD   Brief Narrative:  83 year old female with past medical history significant for chronic systolic and diastolic CHF, chronic kidney disease stage IV, aortic stenosis status post TAVR, history of dementia, paroxysmal atrial fibrillation on Coumadin patient lives in [place] skilled nursing facility. She was brought 06/13/2016 to [facility] for evaluation of acute respiratory distress. Patient was found to have acute combined systolic and diastolic congestive heart failure. She was seen by cardiology in consultation, was diuresed with symptom improvement. She underwent stress test 06/16/2016 with findings of infarction involving inferior wall and part of the apex with no reversible defects to suggest ischemia .   Assessment & Plan:   Principal Problem: Acute on chronic systolic and diastolic heart failure, NYHA class 3 (HCC) - Echo on this admission shows ejection fraction 35-40% with inferior, inferior septal and anterior walls hypokinesis which is new compared to echo in 2016, grade 2 diastolic dysfunction - Appreciate cardiology following and their recommendations - Patient was on Lasix 80 mg every 12 hours but this was stopped because of renal insufficiency - Patient is currently on torsemide 100 mg daily but cardio to reduce the dose - Continue isosorbide and hydralazine  - We'll monitor renal function, repeat BMP in the morning - Continue daily weight and strict intake and output  Active Problems:   Paroxysmal atrial fibrillation (HCC) - CHADS vasc score 6 - Continue rate controlled with amiodarone and Coreg - Continue Coumadin for anticoagulation    Essential hypertension - Continue Coreg, hydralazine and isosorbide    Dementia without behavioral disturbance - Stable mental status - Continue  Namenda 5 mg twice daily, Aricept 10 mg at bedtime, Cymbalta 60 mg at bedtime    Chest pain - Thought to be from fluid overload - No obstructive disease in 2014 - No clear stress test on this admission showed infarction involving the inferior wall and part of the apex with no reversible defects to suggest ischemia, ejection fraction 42%, high risk study - Cardio added isosorbide    Hypothyroidism - Continue Synthroid 75 g daily    Dyslipidemia - Continue Lipitor 10 mg at bedtime    CKD (chronic kidney disease) stage 4, GFR 15-29 ml/min (HCC) - Baseline creatinine in September 2017 as high as 3.1 - Creatinine 2.23 recently, within baseline range    Anemia of chronic kidney disease  - Hemoglobin stable    DVT prophylaxis: On anticoagulation with Coumadin Code Status: full code  Family Communication: Spoke with patient's son over the phone this morning Disposition Plan: Will repeat creatinine tomorrow to make sure no further upward trend, cardio will reduce torsemide dose as well and titrate isosorbide to 60 mg a day for treatment of CAD   Consultants:   Cardiology  Nephrology  Procedures:   Echo 06/13/2016 showed ejection fraction 35-40% with inferior hypokinesis, inferior septal and anterior walls hypokinesis which is new compared to echo in 2016; grade 2 diastolic dysfunction  Antimicrobials:   None   Subjective: No overnight events.   Objective: Vitals:   06/17/16 2037 06/18/16 0424 06/18/16 0900 06/18/16 1200  BP: (!) 154/54 (!) 157/52 131/60 (!) 120/54  Pulse: 62 65 66 65  Resp: 19 19  18   Temp: 98.4 F (36.9 C) 98.2 F (36.8 C) 97.7 F (36.5 C) 98.2 F (36.8  C)  TempSrc: Oral Oral Oral Oral  SpO2: 96% 98% 96% 95%  Weight:  80.4 kg (177 lb 3.2 oz)    Height:        Intake/Output Summary (Last 24 hours) at 06/18/16 1301 Last data filed at 06/18/16 1100  Gross per 24 hour  Intake              480 ml  Output             2400 ml  Net             -1920 ml   Filed Weights   06/16/16 0448 06/17/16 0457 06/18/16 0424  Weight: 80.1 kg (176 lb 9.6 oz) 80.7 kg (177 lb 14.4 oz) 80.4 kg (177 lb 3.2 oz)    Examination:  General exam: Appears calm and comfortable  Respiratory system: Clear to auscultation. Respiratory effort normal. Cardiovascular system: S1 & S2 heard, RRR. trace pedal edema. Gastrointestinal system: Abdomen is nondistended, soft and nontender. No organomegaly or masses felt. Normal bowel sounds heard. Central nervous system: No focal neurological deficits. Extremities: Symmetric 5 x 5 power. Skin: No rashes, lesions or ulcers Psychiatry: Judgement and insight appear normal. Mood & affect appropriate.   Data Reviewed: I have personally reviewed following labs and imaging studies  CBC:  Recent Labs Lab 06/13/16 0521 06/14/16 1444 06/17/16 0451  WBC 14.0* 8.9 10.0  NEUTROABS 11.6*  --   --   HGB 11.8* 11.2* 9.8*  HCT 36.9 35.1* 30.3*  MCV 95.1 96.4 92.1  PLT 220 169 194   Basic Metabolic Panel:  Recent Labs Lab 06/14/16 0244 06/14/16 1444 06/15/16 0459 06/16/16 0441 06/17/16 0451 06/18/16 0237  NA 141  --  142 141 136 137  K 5.1  --  4.5 4.5 4.3 3.7  CL 102  --  100* 103 102 100*  CO2 31  --  30 28 27 30   GLUCOSE 98  --  79 138* 127* 95  BUN 30*  --  39* 36* 48* 55*  CREATININE 1.97* 2.03* 2.20* 2.05* 2.23* 2.28*  CALCIUM 9.0  --  9.1 9.4 9.0 8.7*   GFR: Estimated Creatinine Clearance: 16.6 mL/min (by C-G formula based on SCr of 2.28 mg/dL (H)). Liver Function Tests:  Recent Labs Lab 06/13/16 0521  AST 26  ALT 14  ALKPHOS 59  BILITOT 0.8  PROT 5.9*  ALBUMIN 3.1*   No results for input(s): LIPASE, AMYLASE in the last 168 hours. No results for input(s): AMMONIA in the last 168 hours. Coagulation Profile:  Recent Labs Lab 06/14/16 0244 06/15/16 0459 06/16/16 0441 06/17/16 0451 06/18/16 0237  INR 1.77 1.65 1.31 1.07 1.16   Cardiac Enzymes: No results for input(s): CKTOTAL,  CKMB, CKMBINDEX, TROPONINI in the last 168 hours. BNP (last 3 results) No results for input(s): PROBNP in the last 8760 hours. HbA1C: No results for input(s): HGBA1C in the last 72 hours. CBG:  Recent Labs Lab 06/17/16 1144 06/17/16 1656 06/17/16 2140 06/18/16 0746 06/18/16 1146  GLUCAP 121* 119* 112* 100* 107*   Lipid Profile: No results for input(s): CHOL, HDL, LDLCALC, TRIG, CHOLHDL, LDLDIRECT in the last 72 hours. Thyroid Function Tests: No results for input(s): TSH, T4TOTAL, FREET4, T3FREE, THYROIDAB in the last 72 hours. Anemia Panel: No results for input(s): VITAMINB12, FOLATE, FERRITIN, TIBC, IRON, RETICCTPCT in the last 72 hours. Urine analysis:    Component Value Date/Time   COLORURINE YELLOW 06/19/2015 1339   APPEARANCEUR CLEAR 06/19/2015 1339   LABSPEC 1.007  06/19/2015 1339   PHURINE 6.0 06/19/2015 1339   GLUCOSEU NEGATIVE 06/19/2015 1339   HGBUR NEGATIVE 06/19/2015 1339   BILIRUBINUR NEGATIVE 06/19/2015 1339   KETONESUR NEGATIVE 06/19/2015 1339   PROTEINUR NEGATIVE 06/19/2015 1339   UROBILINOGEN 0.2 11/29/2014 1411   NITRITE NEGATIVE 06/19/2015 1339   LEUKOCYTESUR NEGATIVE 06/19/2015 1339   Sepsis Labs: @LABRCNTIP (procalcitonin:4,lacticidven:4)   Recent Results (from the past 240 hour(s))  MRSA PCR Screening     Status: Abnormal   Collection Time: 06/13/16 12:21 PM  Result Value Ref Range Status   MRSA by PCR POSITIVE (A) NEGATIVE Final      Radiology Studies: Nm Myocar Multi W/spect W/wall Motion / Ef Result Date: 06/16/2016 1. Infarction/scar involving the inferior wall and part of the apex. No reversible defects to suggest ischemia. 2. Wall motion abnormality involving the inferior wall and part of the apex. Moderate left ventricular dilatation. 3. Left ventricular ejection fraction 42% 4. Non invasive risk stratification*: High risk. *2012 Appropriate Use Criteria for Coronary Revascularization Focused Update: J Am Coll Cardiol. 2012;59(9):857-881.  http://content.dementiazones.com.aspx?articleid=[email or web address] Electronically Signed   By: Rudie Meyer M.D.   On: 06/16/2016 09:20   Dg Chest Port 1 View Result Date: 06/15/2016 Markedly improved pulmonary edema. Cardiomegaly. Atherosclerosis.     Scheduled Meds: . amiodarone  100 mg Oral Daily  . atorvastatin  10 mg Oral q1800  . calcitRIOL  0.25 mcg Oral q morning - 10a  . carvedilol  12.5 mg Oral BID  . cycloSPORINE  1 drop Both Eyes BID  . donepezil  10 mg Oral QHS  . DULoxetine  60 mg Oral QPM  . febuxostat  40 mg Oral QPM  . heparin  5,000 Units Subcutaneous Q8H  . hydrALAZINE  25 mg Oral TID  . insulin aspart  0-15 Units Subcutaneous TID WC  . insulin aspart  0-5 Units Subcutaneous QHS  . [START ON 06/19/2016] isosorbide mononitrate  60 mg Oral Daily  . levothyroxine  75 mcg Oral QAC breakfast  . linaclotide  145 mcg Oral Daily  . loratadine  10 mg Oral Daily  . mouth rinse  15 mL Mouth Rinse BID  . memantine  5 mg Oral BID  . MUSCLE RUB   Topical BID  . pantoprazole  40 mg Oral Daily  . predniSONE  60 mg Oral Q breakfast  . pregabalin  50 mg Oral 3 times per day  . senna  2 tablet Oral BID  . sodium chloride flush  3 mL Intravenous Q12H  . torsemide  100 mg Oral Daily  . warfarin  3 mg Oral ONCE-1800  . Warfarin - Pharmacist Dosing Inpatient   Does not apply q1800   Continuous Infusions:   LOS: 5 days    Time spent: 25 minutes  Greater than 50% of the time spent on counseling and coordinating the care.   Manson Passey, MD [facility] Pager [phone]  If 7PM-7AM, please contact night-coverage [email or web address] Password [email or web address] 06/18/2016, 1:01 PM

## 2016-06-18 NOTE — Consult Note (Signed)
   [facility] CM Inpatient Consult   06/18/2016  [name] 1932-09-26 [id number]   Patient evaluated for [facility] Care Management services.  Patient is [name] is an 83 y.o. female with PMHx: diastolic HF, CKD stage IV, aortic stenosis, dementia, chronic pain, HTN, paroxysmal A-fib, right patella tendon repair, remote lt patellar tendon repair. Admitted with  respiratory distress with xray revealing mild CHF per PT notes.  Confirmed with inpatient CSW that she is a resident at her facility at [facility].  There are no [facility] Care Management needs at this time.  For questions, please contact:  Charlesetta Shanks, RN BSN CCM [facility]  [phone] business mobile phone Toll free office [phone]

## 2016-06-18 NOTE — Progress Notes (Signed)
[facility] ROUNDING NOTE   Subjective:   Interval History: Interval History:Jodi B Mitchellis a 83 y.o.femalewith a history of diastolic heart failure grade 1 an echocardiogram on 02/2015, CK D stage IV, aortic stenosis, dementia, chronic pain, hypertension, paroxysmal atrial fibrillation with chads 2 vasc score of 6 on coumadin. Patient is resident of [facility] health center presents with onset of respiratory distress. She was found to be in respiratory distress by nursing home staff, was brought to the emergency room for evaluation. Patient has dementia and is unable to provide history.  Baseline creatinine 2 Good response to diuretics Creatinine about at baseline  Concern regarding appropriate dosing of diuretics   Objective:  Vital signs in last 24 hours:  Temp:  [97.7 F (36.5 C)-98.4 F (36.9 C)] 97.7 F (36.5 C) (02/28 0900) Pulse Rate:  [62-66] 66 (02/28 0900) Resp:  [19] 19 (02/28 0424) BP: (131-157)/(52-60) 131/60 (02/28 0900) SpO2:  [96 %-98 %] 96 % (02/28 0900) Weight:  [80.4 kg (177 lb 3.2 oz)] 80.4 kg (177 lb 3.2 oz) (02/28 0424)  Weight change: -0.318 kg (-11.2 oz) Filed Weights   06/16/16 0448 06/17/16 0457 06/18/16 0424  Weight: 80.1 kg (176 lb 9.6 oz) 80.7 kg (177 lb 14.4 oz) 80.4 kg (177 lb 3.2 oz)    Intake/Output: I/O last 3 completed shifts: In: 720 [P.O.:720] Out: 2170 [Urine:2170]   Intake/Output this shift:  Total I/O In: 240 [P.O.:240] Out: 750 [Urine:750]  CVS- RRR RS- CTA ABD- BS present soft non-distended EXT- no edema   Basic Metabolic Panel:  Recent Labs Lab 06/14/16 0244 06/14/16 1444 06/15/16 0459 06/16/16 0441 06/17/16 0451 06/18/16 0237  NA 141  --  142 141 136 137  K 5.1  --  4.5 4.5 4.3 3.7  CL 102  --  100* 103 102 100*  CO2 31  --  30 28 27 30   GLUCOSE 98  --  79 138* 127* 95  BUN 30*  --  39* 36* 48* 55*  CREATININE 1.97* 2.03* 2.20* 2.05* 2.23* 2.28*  CALCIUM 9.0  --  9.1 9.4 9.0 8.7*     Liver Function Tests:  Recent Labs Lab 06/13/16 0521  AST 26  ALT 14  ALKPHOS 59  BILITOT 0.8  PROT 5.9*  ALBUMIN 3.1*   No results for input(s): LIPASE, AMYLASE in the last 168 hours. No results for input(s): AMMONIA in the last 168 hours.  CBC:  Recent Labs Lab 06/13/16 0521 06/14/16 1444 06/17/16 0451  WBC 14.0* 8.9 10.0  NEUTROABS 11.6*  --   --   HGB 11.8* 11.2* 9.8*  HCT 36.9 35.1* 30.3*  MCV 95.1 96.4 92.1  PLT 220 169 194    Cardiac Enzymes: No results for input(s): CKTOTAL, CKMB, CKMBINDEX, TROPONINI in the last 168 hours.  BNP: Invalid input(s): POCBNP  CBG:  Recent Labs Lab 06/17/16 0733 06/17/16 1144 06/17/16 1656 06/17/16 2140 06/18/16 0746  GLUCAP 116* 121* 119* 112* 100*    Microbiology: Results for orders placed or performed during the hospital encounter of 06/13/16  MRSA PCR Screening     Status: Abnormal   Collection Time: 06/13/16 12:21 PM  Result Value Ref Range Status   MRSA by PCR POSITIVE (A) NEGATIVE Final    Comment:        The GeneXpert MRSA Assay (FDA approved for NASAL specimens only), is one component of a comprehensive MRSA colonization surveillance program. It is not intended to diagnose MRSA infection nor to guide or monitor  treatment for MRSA infections. RESULT CALLED TO, READ BACK BY AND VERIFIED WITH: Tyler Aas RN 15:05 06/13/16 ([email or web address])     Coagulation Studies:  Recent Labs  06/16/16 0441 06/17/16 0451 06/18/16 0237  LABPROT 16.4* 13.9 14.9  INR 1.31 1.07 1.16    Urinalysis: No results for input(s): COLORURINE, LABSPEC, PHURINE, GLUCOSEU, HGBUR, BILIRUBINUR, KETONESUR, PROTEINUR, UROBILINOGEN, NITRITE, LEUKOCYTESUR in the last 72 hours.  Invalid input(s): APPERANCEUR    Imaging: No results found.   Medications:    . amiodarone  100 mg Oral Daily  . atorvastatin  10 mg Oral q1800  . calcitRIOL  0.25 mcg Oral q morning - 10a  . carvedilol  12.5 mg Oral BID  . cycloSPORINE  1 drop  Both Eyes BID  . donepezil  10 mg Oral QHS  . DULoxetine  60 mg Oral QPM  . febuxostat  40 mg Oral QPM  . heparin  5,000 Units Subcutaneous Q8H  . hydrALAZINE  25 mg Oral TID  . insulin aspart  0-15 Units Subcutaneous TID WC  . insulin aspart  0-5 Units Subcutaneous QHS  . [START ON 06/19/2016] isosorbide mononitrate  60 mg Oral Daily  . levothyroxine  75 mcg Oral QAC breakfast  . linaclotide  145 mcg Oral Daily  . loratadine  10 mg Oral Daily  . mouth rinse  15 mL Mouth Rinse BID  . memantine  5 mg Oral BID  . MUSCLE RUB   Topical BID  . pantoprazole  40 mg Oral Daily  . predniSONE  60 mg Oral Q breakfast  . pregabalin  50 mg Oral 3 times per day  . senna  2 tablet Oral BID  . sodium chloride flush  3 mL Intravenous Q12H  . torsemide  100 mg Oral Daily  . warfarin  3 mg Oral ONCE-1800  . Warfarin - Pharmacist Dosing Inpatient   Does not apply q1800   sodium chloride, acetaminophen, bisacodyl, HYDROcodone-acetaminophen, ipratropium-albuterol, ondansetron (ZOFRAN) IV, simethicone, sodium chloride flush  Assessment/ Plan:   CKD stage 4 Creatinine about 2 baseline Admitted with congestive heart failure No evidence of nephrotoxins Avoid ACE ARB and NSAIDS No contrast studies for now  HTN Controlled Volume great euvolemic Maybe the best we can get Would continue torsemide   Anemia no ESA  Secondary HPT stable   Will defer nephrology follow up to primary care and cardiology  Fluid and diuretic management could be accomplished by cardiology for now     LOS: 5 Sundeep Cary W @TODAY @11 :33 AM

## 2016-06-19 DIAGNOSIS — R079 Chest pain, unspecified: Secondary | ICD-10-CM

## 2016-06-19 LAB — GLUCOSE, CAPILLARY
Glucose-Capillary: 93 mg/dL (ref 65–99)
Glucose-Capillary: 93 mg/dL (ref 65–99)
Glucose-Capillary: 106 mg/dL — ABNORMAL HIGH (ref 65–99)
Glucose-Capillary: 91 mg/dL (ref 65–99)

## 2016-06-19 LAB — BASIC METABOLIC PANEL
ANION GAP: 10 (ref 5–15)
BUN: 62 mg/dL — ABNORMAL HIGH (ref 6–20)
CALCIUM: 9.1 mg/dL (ref 8.9–10.3)
CO2: 30 mmol/L (ref 22–32)
CREATININE: 2.35 mg/dL — AB (ref 0.44–1.00)
Chloride: 100 mmol/L — ABNORMAL LOW (ref 101–111)
GFR calc Af Amer: 21 mL/min — ABNORMAL LOW (ref >60)
GFR, EST NON AFRICAN AMERICAN: 18 mL/min — AB (ref >60)
GLUCOSE: 103 mg/dL — AB (ref 65–99)
Potassium: 4 mmol/L (ref 3.5–5.1)
Sodium: 140 mmol/L (ref 135–145)

## 2016-06-19 LAB — PROTIME-INR
INR: 0.99
PROTHROMBIN TIME: 13.1 s (ref 11.4–15.2)

## 2016-06-19 MED ORDER — WARFARIN SODIUM 2 MG PO TABS
4.0000 mg | ORAL_TABLET | Freq: Once | ORAL | Status: AC
  Administered 2016-06-19: 4 mg via ORAL
  Filled 2016-06-19: qty 2

## 2016-06-19 NOTE — Progress Notes (Signed)
Patient ID: [name], female   DOB: 02-Apr-1933, 83 y.o.   MRN: [id number]  PROGRESS NOTE    [name]  WUJ:[id number] DOB: 02-24-1933 DOA: 06/13/2016  PCP: Oneal Grout, MD   Brief Narrative:  83 year old female with past medical history significant for chronic systolic and diastolic CHF, chronic kidney disease stage IV, aortic stenosis status post TAVR, history of dementia, paroxysmal atrial fibrillation on Coumadin patient lives in [facility] skilled nursing facility. She was brought 06/13/2016 to [facility] for evaluation of acute respiratory distress. Patient was found to have acute combined systolic and diastolic congestive heart failure. She was seen by cardiology in consultation, was diuresed with symptom improvement. She underwent stress test 06/16/2016 with findings of infarction involving inferior wall and part of the apex with no reversible defects to suggest ischemia .   Assessment & Plan:   Principal Problem: Acute on chronic systolic and diastolic heart failure, NYHA class 3 (HCC) - Echo on this admission shows ejection fraction 35-40% with inferior, inferior septal and anterior walls hypokinesis which is new compared to echo in 2016, grade 2 diastolic dysfunction - Appreciate cardiology following  - Patient was on Lasix 80 mg every 12 hours but this was stopped because of renal insufficiency - Patient currently on torsemide 100 mg daily but cardio stopped today due to concern for overdiuresis - Continue isosorbide and hydralazine  - Continue daily weight and strict intake and output  Active Problems:   Paroxysmal atrial fibrillation (HCC) - CHADS vasc score 6 - Continue rate controlled with amiodarone and Coreg - Continue Coumadin for anticoagulation    Essential hypertension - Continue Coreg, hydralazine and isosorbide    Dementia without behavioral disturbance - Continue Namenda 5 mg twice daily, Aricept 10 mg at bedtime, Cymbalta 60 mg at bedtime   Chest pain - No obstructive disease in 2014 - Stress test on this admission showed infarction involving the inferior wall and part of the apex with no reversible defects to suggest ischemia, ejection fraction 42%, high risk study    Hypothyroidism - Continue Synthroid 75 g daily    Dyslipidemia - Continue Lipitor 10 mg at bedtime    CKD (chronic kidney disease) stage 4, GFR 15-29 ml/min (HCC) - Baseline creatinine in September 2017 as high as 3.1 - Creatinine up this am at 2.3 - Follow up BMP tomorrow am - Stopped torsemide today     Anemia of chronic kidney disease  - Hemoglobin stable    DVT prophylaxis: On anticoagulation with Coumadin Code Status: full code  Family Communication: Spoke with patient's son over the phone this morning Disposition Plan: Stopped torsemide today, cardio to eval in am as well and determine safe discharge date   Consultants:   Cardiology  Nephrology  Procedures:   Echo 06/13/2016 showed ejection fraction 35-40% with inferior hypokinesis, inferior septal and anterior walls hypokinesis which is new compared to echo in 2016; grade 2 diastolic dysfunction  Antimicrobials:   None   Subjective: No overnight events.   Objective: Vitals:   06/18/16 1200 06/18/16 2144 06/19/16 0520 06/19/16 1109  BP: (!) 120/54 (!) 146/51 (!) 158/76 (!) 114/57  Pulse: 65 70 74 78  Resp: 18 17 18 18   Temp: 98.2 F (36.8 C) 98.2 F (36.8 C) 98.4 F (36.9 C) 97.6 F (36.4 C)  TempSrc: Oral Oral Oral Oral  SpO2: 95% 96% 99% 96%  Weight:   79.4 kg (175 lb 1.6 oz)   Height:  Intake/Output Summary (Last 24 hours) at 06/19/16 1343 Last data filed at 06/19/16 1120  Gross per 24 hour  Intake              480 ml  Output              775 ml  Net             -295 ml   Filed Weights   06/17/16 0457 06/18/16 0424 06/19/16 0520  Weight: 80.7 kg (177 lb 14.4 oz) 80.4 kg (177 lb 3.2 oz) 79.4 kg (175 lb 1.6 oz)    Examination:  General exam: Appears  calm and comfortable, no distress  Respiratory system: No wheezing, no rhonchi  Cardiovascular system: S1 & S2 heard, Rate controlled, trace pedal edema. Gastrointestinal system: (+) BS, non tender abdomen  Central nervous system: Nonfocal Extremities: Palpable pulses, no tenderness  Skin: Skin is warm and dry  Psychiatry: Mood & affect appropriate.   Data Reviewed: I have personally reviewed following labs and imaging studies  CBC:  Recent Labs Lab 06/13/16 0521 06/14/16 1444 06/17/16 0451  WBC 14.0* 8.9 10.0  NEUTROABS 11.6*  --   --   HGB 11.8* 11.2* 9.8*  HCT 36.9 35.1* 30.3*  MCV 95.1 96.4 92.1  PLT 220 169 194   Basic Metabolic Panel:  Recent Labs Lab 06/15/16 0459 06/16/16 0441 06/17/16 0451 06/18/16 0237 06/19/16 0401  NA 142 141 136 137 140  K 4.5 4.5 4.3 3.7 4.0  CL 100* 103 102 100* 100*  CO2 30 28 27 30 30   GLUCOSE 79 138* 127* 95 103*  BUN 39* 36* 48* 55* 62*  CREATININE 2.20* 2.05* 2.23* 2.28* 2.35*  CALCIUM 9.1 9.4 9.0 8.7* 9.1   GFR: Estimated Creatinine Clearance: 16 mL/min (by C-G formula based on SCr of 2.35 mg/dL (H)). Liver Function Tests:  Recent Labs Lab 06/13/16 0521  AST 26  ALT 14  ALKPHOS 59  BILITOT 0.8  PROT 5.9*  ALBUMIN 3.1*   No results for input(s): LIPASE, AMYLASE in the last 168 hours. No results for input(s): AMMONIA in the last 168 hours. Coagulation Profile:  Recent Labs Lab 06/15/16 0459 06/16/16 0441 06/17/16 0451 06/18/16 0237 06/19/16 0401  INR 1.65 1.31 1.07 1.16 0.99   Cardiac Enzymes: No results for input(s): CKTOTAL, CKMB, CKMBINDEX, TROPONINI in the last 168 hours. BNP (last 3 results) No results for input(s): PROBNP in the last 8760 hours. HbA1C: No results for input(s): HGBA1C in the last 72 hours. CBG:  Recent Labs Lab 06/18/16 1146 06/18/16 1620 06/18/16 2146 06/19/16 0758 06/19/16 1105  GLUCAP 107* 179* 137* 93 93   Lipid Profile: No results for input(s): CHOL, HDL, LDLCALC,  TRIG, CHOLHDL, LDLDIRECT in the last 72 hours. Thyroid Function Tests: No results for input(s): TSH, T4TOTAL, FREET4, T3FREE, THYROIDAB in the last 72 hours. Anemia Panel: No results for input(s): VITAMINB12, FOLATE, FERRITIN, TIBC, IRON, RETICCTPCT in the last 72 hours. Urine analysis:    Component Value Date/Time   COLORURINE YELLOW 06/19/2015 1339   APPEARANCEUR CLEAR 06/19/2015 1339   LABSPEC 1.007 06/19/2015 1339   PHURINE 6.0 06/19/2015 1339   GLUCOSEU NEGATIVE 06/19/2015 1339   HGBUR NEGATIVE 06/19/2015 1339   BILIRUBINUR NEGATIVE 06/19/2015 1339   KETONESUR NEGATIVE 06/19/2015 1339   PROTEINUR NEGATIVE 06/19/2015 1339   UROBILINOGEN 0.2 11/29/2014 1411   NITRITE NEGATIVE 06/19/2015 1339   LEUKOCYTESUR NEGATIVE 06/19/2015 1339   Sepsis Labs: @LABRCNTIP (procalcitonin:4,lacticidven:4)   Recent Results (from the past 240  hour(s))  MRSA PCR Screening     Status: Abnormal   Collection Time: 06/13/16 12:21 PM  Result Value Ref Range Status   MRSA by PCR POSITIVE (A) NEGATIVE Final      Radiology Studies: Nm Myocar Multi W/spect W/wall Motion / Ef Result Date: 06/16/2016 1. Infarction/scar involving the inferior wall and part of the apex. No reversible defects to suggest ischemia. 2. Wall motion abnormality involving the inferior wall and part of the apex. Moderate left ventricular dilatation. 3. Left ventricular ejection fraction 42% 4. Non invasive risk stratification*: High risk. *2012 Appropriate Use Criteria for Coronary Revascularization Focused Update: J Am Coll Cardiol. 2012;59(9):857-881. http://content.dementiazones.com.aspx?articleid=[email or web address] Electronically Signed   By: Rudie Meyer M.D.   On: 06/16/2016 09:20   Dg Chest Port 1 View Result Date: 06/15/2016 Markedly improved pulmonary edema. Cardiomegaly. Atherosclerosis.     Scheduled Meds: . amiodarone  100 mg Oral Daily  . atorvastatin  10 mg Oral q1800  . calcitRIOL  0.25 mcg Oral q morning - 10a  .  carvedilol  12.5 mg Oral BID  . cycloSPORINE  1 drop Both Eyes BID  . donepezil  10 mg Oral QHS  . DULoxetine  60 mg Oral QPM  . febuxostat  40 mg Oral QPM  . heparin  5,000 Units Subcutaneous Q8H  . hydrALAZINE  25 mg Oral TID  . insulin aspart  0-15 Units Subcutaneous TID WC  . insulin aspart  0-5 Units Subcutaneous QHS  . isosorbide mononitrate  60 mg Oral Daily  . levothyroxine  75 mcg Oral QAC breakfast  . linaclotide  145 mcg Oral Daily  . loratadine  10 mg Oral Daily  . mouth rinse  15 mL Mouth Rinse BID  . memantine  5 mg Oral BID  . MUSCLE RUB   Topical BID  . pantoprazole  40 mg Oral Daily  . pregabalin  50 mg Oral 3 times per day  . senna  2 tablet Oral BID  . sodium chloride flush  3 mL Intravenous Q12H  . warfarin  4 mg Oral ONCE-1800  . Warfarin - Pharmacist Dosing Inpatient   Does not apply q1800   Continuous Infusions:   LOS: 6 days    Time spent: 25 minutes  Greater than 50% of the time spent on counseling and coordinating the care.   Manson Passey, MD [facility] Pager [phone]  If 7PM-7AM, please contact night-coverage [email or web address] Password [email or web address] 06/19/2016, 1:43 PM

## 2016-06-19 NOTE — Progress Notes (Signed)
Physical Therapy Treatment Patient Details Name: [name] MRN: [id number] DOB: 03/24/1933 Today's Date: 06/19/2016    History of Present Illness [name] is a 83 y.o. female with PMHx: diastolic HF, CKD stage IV, aortic stenosis, dementia, chronic pain, HTN, paroxysmal A-fib, right patella tendon repair, remote lt patellar tendon repair. Admitted with  respiratory distress with xray revealing mild CHF.    PT Comments    Pt making steady progress. Pt very motivated to mobilize more.   Follow Up Recommendations  SNF (return to SNF with restorative nursing )     Equipment Recommendations  None recommended by PT    Recommendations for Other Services       Precautions / Restrictions Precautions Precautions: Fall Restrictions Weight Bearing Restrictions: No    Mobility  Bed Mobility Overal bed mobility: Modified Independent Bed Mobility: Supine to Sit     Supine to sit: Modified independent (Device/Increase time);HOB elevated     General bed mobility comments: Incr time and use of rail  Transfers Overall transfer level: Needs assistance Equipment used: Rolling walker (2 wheeled) Transfers: Sit to/from Stand Sit to Stand: Min guard         General transfer comment: Assist for safety  Ambulation/Gait Ambulation/Gait assistance: Min guard Ambulation Distance (Feet): 70 Feet Assistive device: Rolling walker (2 wheeled) Gait Pattern/deviations: Step-through pattern;Decreased step length - right;Decreased step length - left;Trunk flexed Gait velocity: decr Gait velocity interpretation: <1.8 ft/sec, indicative of risk for recurrent falls General Gait Details: Assist for safety   Stairs            Wheelchair Mobility    Modified Rankin (Stroke Patients Only)       Balance Overall balance assessment: Needs assistance Sitting-balance support: No upper extremity supported;Feet supported Sitting balance-Leahy Scale: Fair     Standing  balance support: Bilateral upper extremity supported;During functional activity Standing balance-Leahy Scale: Poor Standing balance comment: walker and supervision for static standing                    Cognition Arousal/Alertness: Awake/alert Behavior During Therapy: WFL for tasks assessed/performed Overall Cognitive Status: Within Functional Limits for tasks assessed                 General Comments: Pt with history of dementia listed but appears quite sharp cognitively with me at this time.    Exercises      General Comments        Pertinent Vitals/Pain Pain Assessment: 0-10 Pain Score: 4  Pain Location: all over Pain Descriptors / Indicators: Aching;Sore Pain Intervention(s): Limited activity within patient's tolerance;Monitored during session    Home Living                      Prior Function            PT Goals (current goals can now be found in the care plan section) Progress towards PT goals: Progressing toward goals    Frequency    Min 3X/week      PT Plan Current plan remains appropriate    Co-evaluation             End of Session Equipment Utilized During Treatment: Gait belt Activity Tolerance: Patient tolerated treatment well Patient left: in chair;with call bell/phone within reach;with chair alarm set Nurse Communication: Mobility status PT Visit Diagnosis: Difficulty in walking, not elsewhere classified (R26.2)     Time: 7829-5621 PT Time Calculation (min) (ACUTE ONLY): 18  min  Charges:  $Gait Training: 8-22 mins                    G CodesAngelina Ok [name] 07/08/16, 10:04 AM [name] PT [phone]

## 2016-06-19 NOTE — Progress Notes (Addendum)
Progress Note  Patient Name: [name] Date of Encounter: 06/19/2016  Primary Cardiologist: Dr. Excell Seltzer EP Dr. Ladona Ridgel  Subjective   The patient is still having intermittent mild chest pressure lasting for 15-20 minutes at a time, slightly better with addition of Imdur. Her breathing has steadily improved since admission.   Inpatient Medications    Scheduled Meds: . amiodarone  100 mg Oral Daily  . atorvastatin  10 mg Oral q1800  . calcitRIOL  0.25 mcg Oral q morning - 10a  . carvedilol  12.5 mg Oral BID  . cycloSPORINE  1 drop Both Eyes BID  . donepezil  10 mg Oral QHS  . DULoxetine  60 mg Oral QPM  . febuxostat  40 mg Oral QPM  . heparin  5,000 Units Subcutaneous Q8H  . hydrALAZINE  25 mg Oral TID  . insulin aspart  0-15 Units Subcutaneous TID WC  . insulin aspart  0-5 Units Subcutaneous QHS  . isosorbide mononitrate  60 mg Oral Daily  . levothyroxine  75 mcg Oral QAC breakfast  . linaclotide  145 mcg Oral Daily  . loratadine  10 mg Oral Daily  . mouth rinse  15 mL Mouth Rinse BID  . memantine  5 mg Oral BID  . MUSCLE RUB   Topical BID  . pantoprazole  40 mg Oral Daily  . pregabalin  50 mg Oral 3 times per day  . senna  2 tablet Oral BID  . sodium chloride flush  3 mL Intravenous Q12H  . torsemide  100 mg Oral Daily  . warfarin  4 mg Oral ONCE-1800  . Warfarin - Pharmacist Dosing Inpatient   Does not apply q1800   Continuous Infusions:  PRN Meds: sodium chloride, acetaminophen, bisacodyl, HYDROcodone-acetaminophen, ipratropium-albuterol, ondansetron (ZOFRAN) IV, simethicone, sodium chloride flush   Vital Signs    Vitals:   06/18/16 1200 06/18/16 2144 06/19/16 0520 06/19/16 1109  BP: (!) 120/54 (!) 146/51 (!) 158/76 (!) 114/57  Pulse: 65 70 74 78  Resp: 18 17 18 18   Temp: 98.2 F (36.8 C) 98.2 F (36.8 C) 98.4 F (36.9 C) 97.6 F (36.4 C)  TempSrc: Oral Oral Oral Oral  SpO2: 95% 96% 99% 96%  Weight:   175 lb 1.6 oz (79.4 kg)   Height:         Intake/Output Summary (Last 24 hours) at 06/19/16 1241 Last data filed at 06/19/16 1120  Gross per 24 hour  Intake              720 ml  Output             1075 ml  Net             -355 ml   Filed Weights   06/17/16 0457 06/18/16 0424 06/19/16 0520  Weight: 177 lb 14.4 oz (80.7 kg) 177 lb 3.2 oz (80.4 kg) 175 lb 1.6 oz (79.4 kg)    Telemetry    Predominantly A pacing - Personally Reviewed  ECG    No new   Physical Exam   GEN: No acute distress.   Neck: No JVD Cardiac: RRR, no murmurs, rubs, or gallops.  Respiratory: Lungs clear bilateraly. No rales GI: Soft, nontender, non-distended  MS: No edema; No deformity. Neuro:  Nonfocal  Psych: Normal affect   Labs    Chemistry  Recent Labs Lab 06/13/16 0521  06/17/16 0451 06/18/16 0237 06/19/16 0401  NA 143  < > 136 137 140  K 3.8  < >  4.3 3.7 4.0  CL 108  < > 102 100* 100*  CO2 22  < > 27 30 30   GLUCOSE 166*  < > 127* 95 103*  BUN 27*  < > 48* 55* 62*  CREATININE 1.85*  < > 2.23* 2.28* 2.35*  CALCIUM 9.2  < > 9.0 8.7* 9.1  PROT 5.9*  --   --   --   --   ALBUMIN 3.1*  --   --   --   --   AST 26  --   --   --   --   ALT 14  --   --   --   --   ALKPHOS 59  --   --   --   --   BILITOT 0.8  --   --   --   --   GFRNONAA 24*  < > 19* 19* 18*  GFRAA 28*  < > 22* 22* 21*  ANIONGAP 13  < > 7 7 10   < > = values in this interval not displayed.   Hematology  Recent Labs Lab 06/13/16 0521 06/14/16 1444 06/17/16 0451  WBC 14.0* 8.9 10.0  RBC 3.88 3.64* 3.29*  HGB 11.8* 11.2* 9.8*  HCT 36.9 35.1* 30.3*  MCV 95.1 96.4 92.1  MCH 30.4 30.8 29.8  MCHC 32.0 31.9 32.3  RDW 16.0* 15.8* 15.3  PLT 220 169 194    Cardiac EnzymesNo results for input(s): TROPONINI in the last 168 hours.   Recent Labs Lab 06/13/16 0513  TROPIPOC 0.06     BNP  Recent Labs Lab 06/13/16 0530  BNP 528.3*     DDimer No results for input(s): DDIMER in the last 168 hours.   Radiology    No results found.  Cardiac Studies    Nuclear stress test 06/15/16 IMPRESSION:  1. Infarction/scar involving the inferior wall and part of the apex. No reversible defects to suggest ischemia. 2. Wall motion abnormality involving the inferior wall and part of the apex. Moderate left ventricular dilatation. 3. Left ventricular ejection fraction 42% 4. Non invasive risk stratification*: High risk.  TTE Study Conclusions  - Left ventricle: LVEF is approximately 35 to 40% with hypokinesis   of inferior, inferoseptal and anterior walls This is new compared   to echo of 2016. The cavity size was moderately dilated. Wall   thickness was normal. Features are consistent with a pseudonormal   left ventricular filling pattern, with concomitant abnormal   relaxation and increased filling pressure (grade 2 diastolic   dysfunction). Doppler parameters are consistent with high   ventricular filling pressure. - Aortic valve: AV prosthesis is difficult to see Peak and mean   gradients through the valve are 12 and 7 mm Hg respectively.   There is trace valvular AI Valve area (VTI): 1.82 cm^2. Valve   area (Vmax): 1.7 cm^2. Valve area (Vmean): 1.89 cm^2. - Mitral valve: There was mild to moderate regurgitation. - Left atrium: The atrium was mildly dilated. - Pulmonary arteries: PA peak pressure: 39 mm Hg (S).   Patient Profile     83 y.o. female with hx of cardiomyopathy, chronic systolic heart failure, and severe aortic stenosis. She underwent TAVR 12/21/2012 with a 29 mm Edwards Sapien XT valve. Other cardiac related problems include atrial fibrillation and hypertension. The patient has been chronically anticoagulated with warfarin. The patient has undergone pacemaker placement and she is followed by Dr. Ladona Ridgel.  Last seen by Dr. Excell Seltzer 01/2014.   Other hx  includes, dementia, chronic pain, lives at [facility].   Now with Heart failure.  Assessment & Plan    Acute on chronic systolic and diastolic heart failure -BNP 528,  2/23 -EF 35-40%. With wall motion abnormality -CXR 2/25 showed markedly improved pulmonary edema -Improved, on lasix 80 mg every 12 hours- stopped due to renal function.   -Clinically looks euvolemic. Edema is resolved. Lungs without rales. DOE and orthopnea much improved since admission. I/O  [phone] since admission. -Continue coreg and hydralazine. -Nephrology consulted torsemide resumed by Dr. Hyman Hopes at 100 mg daily on 06/17/16  CKD Stage 4  Patients creatinine today continues to rise. BUN 27 >> 62; Cr 1.85 >2.23 >2.28 >2.35;  I am concerned about over diuresis; would hold torsemide.   Chest pain  -may be due to volume overload.  Non obstructive disease in 2014.    -Nuclear stress test 06/15/16 showed Infarction/scar involving the inferior wall and part of the apex. No reversible defects to suggest ischemia. Wall motion abnormality involving the inferior wall and part of the apex. Moderate left ventricular dilatation. LVEF 42%. High risk study. -DOE is improved but still present. Has mild intermittent chest pressure.  Plan medically for now and follow up outpatient with plan for cath in future if needed -isosorbide added and titrated yesterday to 60 mg.   PAF  -maintaining SR on amiodarone and coreg -CHA2DS2VASc score of 8, is on coumadin. Coumadin resumed with no plan for cath. Pharmacy following.  INR today is 1.16  Hx of TAVR 2014.   -see Echo     Signed, Nicki Guadalajara, MD , [facility] 06/19/2016, 12:41 PM

## 2016-06-19 NOTE — Progress Notes (Signed)
ANTICOAGULATION CONSULT NOTE - Follow Up Consult  Pharmacy Consult for warfarin Indication: atrial fibrillation  Allergies  Allergen Reactions  . Nubain [Nalbuphine Hcl] Hives    Went into cardiac arrest   . Codeine Hives and Nausea Only  . Darvocet [Propoxyphene N-Acetaminophen] Nausea Only    Patient Measurements: Height: 4\' 9"  (144.8 cm) Weight: 175 lb 1.6 oz (79.4 kg) IBW/kg (Calculated) : 38.6  Vital Signs: Temp: 97.6 F (36.4 C) (03/01 1109) Temp Source: Oral (03/01 1109) BP: 114/57 (03/01 1109) Pulse Rate: 78 (03/01 1109)  Labs:  Recent Labs  06/17/16 0451 06/18/16 0237 06/19/16 0401  HGB 9.8*  --   --   HCT 30.3*  --   --   PLT 194  --   --   LABPROT 13.9 14.9 13.1  INR 1.07 1.16 0.99  CREATININE 2.23* 2.28* 2.35*    Estimated Creatinine Clearance: 16 mL/min (by C-G formula based on SCr of 2.35 mg/dL (H)).  Assessment: 83 yo F on PTA warfarin for history of afib.   INR is 0.99. No bleeding noted. Warfarin was being held for cath but plans have been changed and she will not go to cath and warfarin resumed.   PTA dose: 1.5 mg daily  Goal of Therapy:  INR 2-3 Monitor platelets by anticoagulation protocol: Yes   Plan:  Warfarin 4mg  tonight x1 Daily INR Monitor s/sx of bleeding Continue sq heparin for dvt px  If discharged, would give higher dose of warfarin for 2 days then resume home dose.  Sheppard Coil PharmD., BCPS Clinical Pharmacist Pager [phone] 06/19/2016 11:14 AM

## 2016-06-20 LAB — BASIC METABOLIC PANEL
Anion gap: 9 (ref 5–15)
BUN: 63 mg/dL — AB (ref 6–20)
CALCIUM: 8.9 mg/dL (ref 8.9–10.3)
CHLORIDE: 100 mmol/L — AB (ref 101–111)
CO2: 28 mmol/L (ref 22–32)
CREATININE: 2.42 mg/dL — AB (ref 0.44–1.00)
GFR calc non Af Amer: 18 mL/min — ABNORMAL LOW (ref >60)
GFR, EST AFRICAN AMERICAN: 20 mL/min — AB (ref >60)
Glucose, Bld: 79 mg/dL (ref 65–99)
Potassium: 3.7 mmol/L (ref 3.5–5.1)
SODIUM: 137 mmol/L (ref 135–145)

## 2016-06-20 LAB — GLUCOSE, CAPILLARY
GLUCOSE-CAPILLARY: 91 mg/dL (ref 65–99)
GLUCOSE-CAPILLARY: 82 mg/dL (ref 65–99)
GLUCOSE-CAPILLARY: 115 mg/dL — AB (ref 65–99)
Glucose-Capillary: 119 mg/dL — ABNORMAL HIGH (ref 65–99)

## 2016-06-20 LAB — CBC
HCT: 33.5 % — ABNORMAL LOW (ref 36.0–46.0)
HEMOGLOBIN: 10.9 g/dL — AB (ref 12.0–15.0)
MCH: 30.3 pg (ref 26.0–34.0)
MCHC: 32.5 g/dL (ref 30.0–36.0)
MCV: 93.1 fL (ref 78.0–100.0)
Platelets: 213 10*3/uL (ref 150–400)
RBC: 3.6 MIL/uL — ABNORMAL LOW (ref 3.87–5.11)
RDW: 15.8 % — AB (ref 11.5–15.5)
WBC: 9.4 10*3/uL (ref 4.0–10.5)

## 2016-06-20 LAB — PROTIME-INR
INR: 1.08
PROTHROMBIN TIME: 14.1 s (ref 11.4–15.2)

## 2016-06-20 MED ORDER — WARFARIN SODIUM 5 MG PO TABS
5.0000 mg | ORAL_TABLET | Freq: Once | ORAL | Status: AC
  Administered 2016-06-20: 5 mg via ORAL
  Filled 2016-06-20: qty 1

## 2016-06-20 MED ORDER — HYDRALAZINE HCL 25 MG PO TABS
37.5000 mg | ORAL_TABLET | Freq: Three times a day (TID) | ORAL | Status: DC
  Administered 2016-06-20 – 2016-06-23 (×11): 37.5 mg via ORAL
  Filled 2016-06-20 (×12): qty 2

## 2016-06-20 NOTE — Progress Notes (Signed)
ANTICOAGULATION CONSULT NOTE - Follow Up Consult  Pharmacy Consult for warfarin Indication: atrial fibrillation  Allergies  Allergen Reactions  . Nubain [Nalbuphine Hcl] Hives    Went into cardiac arrest   . Codeine Hives and Nausea Only  . Darvocet [Propoxyphene N-Acetaminophen] Nausea Only    Patient Measurements: Height: 4\' 9"  (144.8 cm) Weight: 175 lb 3.2 oz (79.5 kg) (scale c) IBW/kg (Calculated) : 38.6  Vital Signs: Temp: 97.9 F (36.6 C) (03/02 0409) Temp Source: Oral (03/02 0409) BP: 123/55 (03/02 1041) Pulse Rate: 73 (03/02 1041)  Labs:  Recent Labs  06/18/16 0237 06/19/16 0401 06/20/16 0341  HGB  --   --  10.9*  HCT  --   --  33.5*  PLT  --   --  213  LABPROT 14.9 13.1 14.1  INR 1.16 0.99 1.08  CREATININE 2.28* 2.35* 2.42*    Estimated Creatinine Clearance: 15.6 mL/min (by C-G formula based on SCr of 2.42 mg/dL (H)).  Assessment: 83 yo F on PTA warfarin for history of afib.   INR is 1 relatively unchanged this morning. No bleeding noted. Warfarin was being held for cath but plans have been changed and she will not go to cath and warfarin resumed. Pharmacy has been steadily increasing warfarin dose but patient slow to respond.   PTA dose: 1.5 mg daily  Goal of Therapy:  INR 2-3 Monitor platelets by anticoagulation protocol: Yes   Plan:  Warfarin 5mg  tonight x1 Daily INR Monitor s/sx of bleeding Continue sq heparin for dvt px  Sheppard Coil PharmD., BCPS Clinical Pharmacist Pager [phone] 06/20/2016 10:58 AM

## 2016-06-20 NOTE — Clinical Social Work Note (Signed)
CSW continues to follow for discharge needs.  Edon Hoadley, CSW [phone]  

## 2016-06-20 NOTE — Progress Notes (Addendum)
Patient ID: [name], female   DOB: 20-Jan-1933, 83 y.o.   MRN: [id number]  PROGRESS NOTE    [name]  WUJ:[id number] DOB: [birth year] DOA: 06/13/2016  PCP: Oneal Grout, MD   Brief Narrative:  83 year old female with past medical history significant for chronic systolic and diastolic CHF, chronic kidney disease stage IV, aortic stenosis status post TAVR, history of dementia, paroxysmal atrial fibrillation on Coumadin patient lives in [facility] facility. She was brought 06/13/2016 to [facility] for evaluation of acute respiratory distress. Patient was found to have acute combined systolic and diastolic congestive heart failure. She was seen by cardiology in consultation, was diuresed with symptom improvement. She underwent stress test 06/16/2016 with findings of infarction involving inferior wall and part of the apex with no reversible defects to suggest ischemia .   Assessment & Plan:   Principal Problem: Acute on chronic systolic and diastolic heart failure, NYHA class 3 (HCC) - Echo on this admission shows ejection fraction 35-40% with inferior, inferior septal and anterior walls hypokinesis which is new compared to echo in 2016, grade 2 diastolic dysfunction - Patient was on Lasix 80 mg every 12 hours but this was stopped because of renal insufficiency - Got torsemide yesterday but per cardio to stop the torsemide today  - Continue isosorbide and hydralazine  - Cardio following   Active Problems:   Paroxysmal atrial fibrillation (HCC) - CHADS vasc score 6 - Continue rate controlled with amiodarone and Coreg - Continue Coumadin for anticoagulation - INR subtherapeutic     Essential hypertension - Continue Coreg, hydralazine and isosorbide    Dementia without behavioral disturbance - Continue Namenda 5 mg twice daily, Aricept 10 mg at bedtime, Cymbalta 60 mg at bedtime    Chest pain - No obstructive disease in 2014 - Stress test on this admission  showed infarction involving the inferior wall and part of the apex with no reversible defects to suggest ischemia, ejection fraction 42%, high risk study    Hypothyroidism - Continue Synthroid 75 g daily    Dyslipidemia - Continue Lipitor 10 mg at bedtime    CKD (chronic kidney disease) stage 4, GFR 15-29 ml/min (HCC) - Baseline creatinine in September 2017 as high as 3.1 - Creatinine further up this am - Torsemide stopped yesterday but she did get am dose so essentially today is the first day torsemide stopped  - Follow up BMP tomorrow am    Anemia of chronic kidney disease  - Hemoglobin stable at 10.9   DVT prophylaxis: On anticoagulation with Coumadin Code Status: full code  Family Communication: Spoke with patient's son over the phone 3/1 Disposition Plan: to SNF once cleared by cardio    Consultants:   Cardiology  Nephrology  Procedures:   Echo 06/13/2016 showed ejection fraction 35-40% with inferior hypokinesis, inferior septal and anterior walls hypokinesis which is new compared to echo in 2016; grade 2 diastolic dysfunction  Antimicrobials:   None   Subjective: No overnight events.   Objective: Vitals:   06/19/16 1109 06/19/16 2027 06/20/16 0409 06/20/16 1041  BP: (!) 114/57 (!) 122/43 (!) 158/73 (!) 123/55  Pulse: 78 82 78 73  Resp: 18 18 18    Temp: 97.6 F (36.4 C) 98 F (36.7 C) 97.9 F (36.6 C)   TempSrc: Oral Oral Oral   SpO2: 96% 100% 100%   Weight:   175 lb 3.2 oz (79.5 kg)   Height:        Intake/Output Summary (Last 24  hours) at 06/20/16 1236 Last data filed at 06/20/16 1046  Gross per 24 hour  Intake              843 ml  Output              850 ml  Net               -7 ml   Filed Weights   06/18/16 0424 06/19/16 0520 06/20/16 0409  Weight: 177 lb 3.2 oz (80.4 kg) 175 lb 1.6 oz (79.4 kg) 175 lb 3.2 oz (79.5 kg)    Examination:  General exam: no distress  Respiratory system: No wheezing, no rhonchi  Cardiovascular system: S1 &  S2 heard, Rate controlled, trace pedal edema. Gastrointestinal system: (+) BS, no distention  Central nervous system: No focal deficits  Extremities: Palpable pulses bilaterally  Skin: Skin is warm and dry, no lesions  Psychiatry: Mood & affect normal  Data Reviewed: I have personally reviewed following labs and imaging studies  CBC:  Recent Labs Lab 06/14/16 1444 06/17/16 0451 06/20/16 0341  WBC 8.9 10.0 9.4  HGB 11.2* 9.8* 10.9*  HCT 35.1* 30.3* 33.5*  MCV 96.4 92.1 93.1  PLT 169 194 213   Basic Metabolic Panel:  Recent Labs Lab 06/16/16 0441 06/17/16 0451 06/18/16 0237 06/19/16 0401 06/20/16 0341  NA 141 136 137 140 137  K 4.5 4.3 3.7 4.0 3.7  CL 103 102 100* 100* 100*  CO2 28 27 30 30 28   GLUCOSE 138* 127* 95 103* 79  BUN 36* 48* 55* 62* 63*  CREATININE 2.05* 2.23* 2.28* 2.35* 2.42*  CALCIUM 9.4 9.0 8.7* 9.1 8.9   GFR: Estimated Creatinine Clearance: 15.6 mL/min (by C-G formula based on SCr of 2.42 mg/dL (H)). Liver Function Tests: No results for input(s): AST, ALT, ALKPHOS, BILITOT, PROT, ALBUMIN in the last 168 hours. No results for input(s): LIPASE, AMYLASE in the last 168 hours. No results for input(s): AMMONIA in the last 168 hours. Coagulation Profile:  Recent Labs Lab 06/16/16 0441 06/17/16 0451 06/18/16 0237 06/19/16 0401 06/20/16 0341  INR 1.31 1.07 1.16 0.99 1.08   Cardiac Enzymes: No results for input(s): CKTOTAL, CKMB, CKMBINDEX, TROPONINI in the last 168 hours. BNP (last 3 results) No results for input(s): PROBNP in the last 8760 hours. HbA1C: No results for input(s): HGBA1C in the last 72 hours. CBG:  Recent Labs Lab 06/19/16 1105 06/19/16 1642 06/19/16 2117 06/20/16 0757 06/20/16 1124  GLUCAP 93 106* 91 91 82   Lipid Profile: No results for input(s): CHOL, HDL, LDLCALC, TRIG, CHOLHDL, LDLDIRECT in the last 72 hours. Thyroid Function Tests: No results for input(s): TSH, T4TOTAL, FREET4, T3FREE, THYROIDAB in the last 72  hours. Anemia Panel: No results for input(s): VITAMINB12, FOLATE, FERRITIN, TIBC, IRON, RETICCTPCT in the last 72 hours. Urine analysis:    Component Value Date/Time   COLORURINE YELLOW 06/19/2015 1339   APPEARANCEUR CLEAR 06/19/2015 1339   LABSPEC 1.007 06/19/2015 1339   PHURINE 6.0 06/19/2015 1339   GLUCOSEU NEGATIVE 06/19/2015 1339   HGBUR NEGATIVE 06/19/2015 1339   BILIRUBINUR NEGATIVE 06/19/2015 1339   KETONESUR NEGATIVE 06/19/2015 1339   PROTEINUR NEGATIVE 06/19/2015 1339   UROBILINOGEN 0.2 11/29/2014 1411   NITRITE NEGATIVE 06/19/2015 1339   LEUKOCYTESUR NEGATIVE 06/19/2015 1339   Sepsis Labs: @LABRCNTIP (procalcitonin:4,lacticidven:4)   Recent Results (from the past 240 hour(s))  MRSA PCR Screening     Status: Abnormal   Collection Time: 06/13/16 12:21 PM  Result Value Ref Range Status  MRSA by PCR POSITIVE (A) NEGATIVE Final      Radiology Studies: Nm Myocar Multi W/spect W/wall Motion / Ef Result Date: 06/16/2016 1. Infarction/scar involving the inferior wall and part of the apex. No reversible defects to suggest ischemia. 2. Wall motion abnormality involving the inferior wall and part of the apex. Moderate left ventricular dilatation. 3. Left ventricular ejection fraction 42% 4. Non invasive risk stratification*: High risk. *2012 Appropriate Use Criteria for Coronary Revascularization Focused Update: J Am Coll Cardiol. 2012;59(9):857-881. http://content.[email or web address].aspx?articleid=[email or web address] Electronically Signed   By: Rudie Meyer M.D.   On: 06/16/2016 09:20   Dg Chest Port 1 View Result Date: 06/15/2016 Markedly improved pulmonary edema. Cardiomegaly. Atherosclerosis.     Scheduled Meds: . amiodarone  100 mg Oral Daily  . atorvastatin  10 mg Oral q1800  . calcitRIOL  0.25 mcg Oral q morning - 10a  . carvedilol  12.5 mg Oral BID  . cycloSPORINE  1 drop Both Eyes BID  . donepezil  10 mg Oral QHS  . DULoxetine  60 mg Oral QPM  . febuxostat  40 mg  Oral QPM  . heparin  5,000 Units Subcutaneous Q8H  . hydrALAZINE  37.5 mg Oral TID  . insulin aspart  0-15 Units Subcutaneous TID WC  . insulin aspart  0-5 Units Subcutaneous QHS  . isosorbide mononitrate  60 mg Oral Daily  . levothyroxine  75 mcg Oral QAC breakfast  . linaclotide  145 mcg Oral Daily  . loratadine  10 mg Oral Daily  . mouth rinse  15 mL Mouth Rinse BID  . memantine  5 mg Oral BID  . MUSCLE RUB   Topical BID  . pantoprazole  40 mg Oral Daily  . pregabalin  50 mg Oral 3 times per day  . senna  2 tablet Oral BID  . sodium chloride flush  3 mL Intravenous Q12H  . warfarin  5 mg Oral ONCE-1800  . Warfarin - Pharmacist Dosing Inpatient   Does not apply q1800   Continuous Infusions:   LOS: 7 days    Time spent: 15 minutes  Greater than 50% of the time spent on counseling and coordinating the care.   Manson Passey, MD [facility] Pager [phone]  If 7PM-7AM, please contact night-coverage [email or web address] Password [facility] 06/20/2016, 12:36 PM

## 2016-06-20 NOTE — Progress Notes (Signed)
Progress Note  Patient Name: [name] Date of Encounter: 06/20/2016  Primary Cardiologist: Dr. Excell Seltzer EP Dr. Ladona Ridgel  Subjective   The patient is still having intermittent mild chest pressure, slightly better with addition of Imdur. Still having DOE with getting up to the [facility]. Edema has resolved.   Inpatient Medications    Scheduled Meds: . amiodarone  100 mg Oral Daily  . atorvastatin  10 mg Oral q1800  . calcitRIOL  0.25 mcg Oral q morning - 10a  . carvedilol  12.5 mg Oral BID  . cycloSPORINE  1 drop Both Eyes BID  . donepezil  10 mg Oral QHS  . DULoxetine  60 mg Oral QPM  . febuxostat  40 mg Oral QPM  . heparin  5,000 Units Subcutaneous Q8H  . hydrALAZINE  25 mg Oral TID  . insulin aspart  0-15 Units Subcutaneous TID WC  . insulin aspart  0-5 Units Subcutaneous QHS  . isosorbide mononitrate  60 mg Oral Daily  . levothyroxine  75 mcg Oral QAC breakfast  . linaclotide  145 mcg Oral Daily  . loratadine  10 mg Oral Daily  . mouth rinse  15 mL Mouth Rinse BID  . memantine  5 mg Oral BID  . MUSCLE RUB   Topical BID  . pantoprazole  40 mg Oral Daily  . pregabalin  50 mg Oral 3 times per day  . senna  2 tablet Oral BID  . sodium chloride flush  3 mL Intravenous Q12H  . Warfarin - Pharmacist Dosing Inpatient   Does not apply q1800   Continuous Infusions:  PRN Meds: sodium chloride, acetaminophen, bisacodyl, HYDROcodone-acetaminophen, ipratropium-albuterol, ondansetron (ZOFRAN) IV, simethicone, sodium chloride flush   Vital Signs    Vitals:   06/19/16 0520 06/19/16 1109 06/19/16 2027 06/20/16 0409  BP: (!) 158/76 (!) 114/57 (!) 122/43 (!) 158/73  Pulse: 74 78 82 78  Resp: 18 18 18 18   Temp: 98.4 F (36.9 C) 97.6 F (36.4 C) 98 F (36.7 C) 97.9 F (36.6 C)  TempSrc: Oral Oral Oral Oral  SpO2: 99% 96% 100% 100%  Weight: 175 lb 1.6 oz (79.4 kg)   175 lb 3.2 oz (79.5 kg)  Height:        Intake/Output Summary (Last 24 hours) at 06/20/16 0811 Last data  filed at 06/20/16 7035  Gross per 24 hour  Intake              240 ml  Output              700 ml  Net             -460 ml   Filed Weights   06/18/16 0424 06/19/16 0520 06/20/16 0409  Weight: 177 lb 3.2 oz (80.4 kg) 175 lb 1.6 oz (79.4 kg) 175 lb 3.2 oz (79.5 kg)    Telemetry    Predominantly A pacing - Personally Reviewed  ECG    No new   Physical Exam   GEN: No acute distress.   Neck: No JVD Cardiac: RRR, no murmurs, rubs, or gallops.  Respiratory: Lungs clear bilateraly. No rales GI: Soft, nontender, non-distended  MS: No edema; No deformity. Neuro:  Nonfocal  Psych: Normal affect   Labs    Chemistry  Recent Labs Lab 06/18/16 0237 06/19/16 0401 06/20/16 0341  NA 137 140 137  K 3.7 4.0 3.7  CL 100* 100* 100*  CO2 30 30 28   GLUCOSE 95 103* 79  BUN  55* 62* 63*  CREATININE 2.28* 2.35* 2.42*  CALCIUM 8.7* 9.1 8.9  GFRNONAA 19* 18* 18*  GFRAA 22* 21* 20*  ANIONGAP 7 10 9      Hematology  Recent Labs Lab 06/14/16 1444 06/17/16 0451 06/20/16 0341  WBC 8.9 10.0 9.4  RBC 3.64* 3.29* 3.60*  HGB 11.2* 9.8* 10.9*  HCT 35.1* 30.3* 33.5*  MCV 96.4 92.1 93.1  MCH 30.8 29.8 30.3  MCHC 31.9 32.3 32.5  RDW 15.8* 15.3 15.8*  PLT 169 194 213    Cardiac EnzymesNo results for input(s): TROPONINI in the last 168 hours.  No results for input(s): TROPIPOC in the last 168 hours.   BNP No results for input(s): BNP, PROBNP in the last 168 hours.   DDimer No results for input(s): DDIMER in the last 168 hours.   Radiology    No results found.  Cardiac Studies   Nuclear stress test 06/15/16 IMPRESSION:  1. Infarction/scar involving the inferior wall and part of the apex. No reversible defects to suggest ischemia. 2. Wall motion abnormality involving the inferior wall and part of the apex. Moderate left ventricular dilatation. 3. Left ventricular ejection fraction 42% 4. Non invasive risk stratification*: High risk.  TTE Study Conclusions  - Left  ventricle: LVEF is approximately 35 to 40% with hypokinesis   of inferior, inferoseptal and anterior walls This is new compared   to echo of 2016. The cavity size was moderately dilated. Wall   thickness was normal. Features are consistent with a pseudonormal   left ventricular filling pattern, with concomitant abnormal   relaxation and increased filling pressure (grade 2 diastolic   dysfunction). Doppler parameters are consistent with high   ventricular filling pressure. - Aortic valve: AV prosthesis is difficult to see Peak and mean   gradients through the valve are 12 and 7 mm Hg respectively.   There is trace valvular AI Valve area (VTI): 1.82 cm^2. Valve   area (Vmax): 1.7 cm^2. Valve area (Vmean): 1.89 cm^2. - Mitral valve: There was mild to moderate regurgitation. - Left atrium: The atrium was mildly dilated. - Pulmonary arteries: PA peak pressure: 39 mm Hg (S).   Patient Profile     83 y.o. female with hx of cardiomyopathy, chronic systolic heart failure, and severe aortic stenosis. She underwent TAVR 12/21/2012 with a 29 mm Edwards Sapien XT valve. Other cardiac related problems include atrial fibrillation and hypertension. The patient has been chronically anticoagulated with warfarin. The patient has undergone pacemaker placement and she is followed by Dr. Ladona Ridgel.  Last seen by Dr. Excell Seltzer 01/2014.   Other hx includes, dementia, chronic pain, lives at [facility].   Now with Heart failure.  Assessment & Plan    Acute on chronic systolic and diastolic heart failure -BNP 528, 2/23 -EF 35-40%. With wall motion abnormality -CXR 2/25 showed markedly improved pulmonary edema -Improved, on lasix 80 mg every 12 hours- stopped due to renal function.   -Edema is resolved. Lungs without rales. DOE and orthopnea much improved since admission. I/O  -4414 since admission. Weight stable.  -Continue coreg and hydralazine. -Nephrology consulted torsemide resumed by Dr. Hyman Hopes at 100 mg daily on  06/17/16, SCr rose and torsemide stopped yesterday. Will monitor fluid status.   CKD Stage 4  -Patients creatinine today continues to rise. SCr 2.42 today. Torsemide stopped. Last dose yesterday. When restart would start at lower dose.   Chest pain    -Nuclear stress test 06/15/16 showed Infarction/scar involving the inferior wall and part  of the apex. No reversible defects to suggest ischemia. Wall motion abnormality involving the inferior wall and part of the apex. Moderate left ventricular dilatation. LVEF 42%. High risk study. -DOE is improved but still present. Has mild intermittent chest pressure.  -Plan medical therapy for now and follow up out patient- carvedilol, isosorbide and hydralazine. No aspirin as is on coumadin -isosorbide titrated yesterday to 60 mg.   PAF  -maintaining SR on amiodarone and coreg -CHA2DS2VASc score of 8, is on coumadin. Coumadin resumed with no plan for cath. Pharmacy following.  INR today is 1.08  Hx of TAVR 2014.   -see Echo     Signed, Berton Bon, NP , [facility] 06/20/2016, 8:11 AM   Pager: [phone]  Patient seen and examined. Agree with assessment and plan. Renal fx increased with Cr 2.42; torsemide was discontinued yesterday, but pt had received dose yesterday. Coumadin restarted, but not yet therapeutic. Will further titrate hydralazine today to 37.5 mg q 8 hrs with reduced LV fxn and BP 158/73 earlier. Lennette Bihari, MD, [facility] 06/20/2016 11:33 AM

## 2016-06-21 LAB — CBC
HEMATOCRIT: 32.8 % — AB (ref 36.0–46.0)
Hemoglobin: 10.6 g/dL — ABNORMAL LOW (ref 12.0–15.0)
MCH: 30 pg (ref 26.0–34.0)
MCHC: 32.3 g/dL (ref 30.0–36.0)
MCV: 92.9 fL (ref 78.0–100.0)
Platelets: 195 10*3/uL (ref 150–400)
RBC: 3.53 MIL/uL — AB (ref 3.87–5.11)
RDW: 16 % — ABNORMAL HIGH (ref 11.5–15.5)
WBC: 8.9 10*3/uL (ref 4.0–10.5)

## 2016-06-21 LAB — GLUCOSE, CAPILLARY
GLUCOSE-CAPILLARY: 164 mg/dL — AB (ref 65–99)
GLUCOSE-CAPILLARY: 107 mg/dL — AB (ref 65–99)
Glucose-Capillary: 99 mg/dL (ref 65–99)
Glucose-Capillary: 94 mg/dL (ref 65–99)

## 2016-06-21 LAB — BASIC METABOLIC PANEL
ANION GAP: 11 (ref 5–15)
BUN: 73 mg/dL — ABNORMAL HIGH (ref 6–20)
CALCIUM: 9.3 mg/dL (ref 8.9–10.3)
CO2: 29 mmol/L (ref 22–32)
Chloride: 101 mmol/L (ref 101–111)
Creatinine, Ser: 2.72 mg/dL — ABNORMAL HIGH (ref 0.44–1.00)
GFR calc Af Amer: 18 mL/min — ABNORMAL LOW (ref >60)
GFR, EST NON AFRICAN AMERICAN: 15 mL/min — AB (ref >60)
GLUCOSE: 92 mg/dL (ref 65–99)
POTASSIUM: 4.3 mmol/L (ref 3.5–5.1)
Sodium: 141 mmol/L (ref 135–145)

## 2016-06-21 LAB — PROTIME-INR
INR: 1.12
Prothrombin Time: 14.5 seconds (ref 11.4–15.2)

## 2016-06-21 MED ORDER — CARVEDILOL 25 MG PO TABS
25.0000 mg | ORAL_TABLET | Freq: Two times a day (BID) | ORAL | Status: DC
  Administered 2016-06-21 – 2016-06-27 (×12): 25 mg via ORAL
  Filled 2016-06-21 (×12): qty 1

## 2016-06-21 MED ORDER — WARFARIN SODIUM 3 MG PO TABS
3.0000 mg | ORAL_TABLET | Freq: Once | ORAL | Status: AC
  Administered 2016-06-21: 3 mg via ORAL
  Filled 2016-06-21: qty 1

## 2016-06-21 NOTE — Progress Notes (Signed)
ANTICOAGULATION CONSULT NOTE - Follow Up Consult  Pharmacy Consult for Warfarin Indication: atrial fibrillation  Assessment: 83 yo F on PTA warfarin for history of afib. Warfarin was being held for cath but plans have been changed and she will not go to cath and warfarin resumed. Pharmacy has been steadily increasing warfarin dose but patient slow to respond. INR relatively unchanged at 1.08 today (subtherapeutic), hemoglobin is low but stable, platelets wnl, no overt bleeding noted.   PTA dose: 1.5 mg daily  Goal of Therapy:  INR 2-3 Monitor platelets by anticoagulation protocol: Yes   Plan:  Warfarin 3mg  tonight x1 Daily INR Monitor s/sx of bleeding Continue sq heparin for dvt px   Allergies  Allergen Reactions  . Nubain [Nalbuphine Hcl] Hives    Went into cardiac arrest   . Codeine Hives and Nausea Only  . Darvocet [Propoxyphene N-Acetaminophen] Nausea Only    Patient Measurements: Height: 4\' 9"  (144.8 cm) Weight: 175 lb 6.4 oz (79.6 kg) IBW/kg (Calculated) : 38.6  Vital Signs: Temp: 97.9 F (36.6 C) (03/03 0605) Temp Source: Oral (03/03 0605) BP: 156/79 (03/03 0605) Pulse Rate: 72 (03/03 0605)  Labs:  Recent Labs  06/19/16 0401 06/20/16 0341 06/21/16 0544  HGB  --  10.9* 10.6*  HCT  --  33.5* 32.8*  PLT  --  213 195  LABPROT 13.1 14.1 14.5  INR 0.99 1.08 1.12  CREATININE 2.35* 2.42* 2.72*    Estimated Creatinine Clearance: 13.8 mL/min (by C-G formula based on SCr of 2.72 mg/dL (H)).  Allie Bossier, PharmD PGY1 Pharmacy Resident [phone] (Pager) 06/21/2016 10:25 AM

## 2016-06-21 NOTE — Progress Notes (Signed)
Pt complain of chest pain but she said it is more related with her indigestion, so tab Norco given for pain, Muscle rub applied in her back and leg, will continue to monitor the patient

## 2016-06-21 NOTE — Progress Notes (Addendum)
Patient ID: [name], female   DOB: 1933-01-03, 83 y.o.   MRN: [id number]  PROGRESS NOTE    [name]  WUJ:[id number] DOB: 11-Oct-1932 DOA: 06/13/2016  PCP: Oneal Grout, MD   Brief Narrative:  83 year old female with past medical history significant for chronic systolic and diastolic CHF, chronic kidney disease stage IV, aortic stenosis status post TAVR, history of dementia, paroxysmal atrial fibrillation on Coumadin patient lives in [place] skilled nursing facility. She was brought 06/13/2016 to [facility] for evaluation of acute respiratory distress. Patient was found to have acute combined systolic and diastolic congestive heart failure. She was seen by cardiology in consultation, was diuresed with symptom improvement. She underwent stress test 06/16/2016 with findings of infarction involving inferior wall and part of the apex with no reversible defects to suggest ischemia .   Assessment & Plan:   Principal Problem: Acute on chronic systolic and diastolic heart failure, NYHA class 3 (HCC) - Echo on this admission shows ejection fraction 35-40% with inferior, inferior septal and anterior walls hypokinesis which is new compared to echo in 2016, grade 2 diastolic dysfunction - Patient was on Lasix 80 mg every 12 hours but this was stopped because of renal insufficiency - She was then switched to torsemide 100 mg daily - Torsemide still on hold due to worsening renal function even though her baseline Cr is around 3 but since starting torsemide her Cr has steadily increased from 2.3 --> 2.7 - Continue isosorbide 60 mg daily and hydralazine 37.5 mg PO TID - Cardio following and we appreciate their input   Active Problems:   Paroxysmal atrial fibrillation (HCC) - CHADS vasc score 6 - Continue rate controlled with amiodarone 100 mg daily and Coreg 25 mg bID - Continue Coumadin  - INR subtherapeutic     Essential hypertension - Continue Coreg 25 mg BID, hydralazine 37.5 mg PO  TID and isosorbide 60 mg daily     Dementia without behavioral disturbance - Continue Namenda 5 mg twice daily, Aricept 10 mg at bedtime, Cymbalta 60 mg at bedtime    Chest pain - No obstructive disease in 2014 - Stress test on this admission showed infarction involving the inferior wall and part of the apex with no reversible defects to suggest ischemia, ejection fraction 42%, high risk study    Hypothyroidism - Continue Synthroid 75 g daily    Dyslipidemia - Continue Lipitor 10 mg at bedtime    CKD (chronic kidney disease) stage 4, GFR 15-29 ml/min (HCC) - Baseline creatinine in September 2017 as high as 3.1 - Creatinine further up this am - Torsemide stopped yesterday but she did get am dose so essentially today is the first day torsemide stopped  - Follow up BMP tomorrow am    Anemia of chronic kidney disease  - Hemoglobin stable    Depression  - Continue Cymbalta. Lyrica    DVT prophylaxis: On anticoagulation with Coumadin Code Status: full code  Family Communication: Spoke with patient's son over the phone 3/1 Disposition Plan: to SNF once cleared by cardio    Consultants:   Cardiology  Nephrology  Procedures:   Echo 06/13/2016 showed ejection fraction 35-40% with inferior hypokinesis, inferior septal and anterior walls hypokinesis which is new compared to echo in 2016; grade 2 diastolic dysfunction  Antimicrobials:   None   Subjective: No overnight events.   Objective: Vitals:   06/20/16 0409 06/20/16 1041 06/20/16 1954 06/21/16 0605  BP: (!) 158/73 (!) 123/55 (!) 111/52 (!) 156/79  Pulse: 78 73 68 72  Resp: 18  18 18   Temp: 97.9 F (36.6 C)  98 F (36.7 C) 97.9 F (36.6 C)  TempSrc: Oral  Oral Oral  SpO2: 100%  98% 98%  Weight: 79.5 kg (175 lb 3.2 oz)   79.6 kg (175 lb 6.4 oz)  Height:        Intake/Output Summary (Last 24 hours) at 06/21/16 1200 Last data filed at 06/21/16 0853  Gross per 24 hour  Intake              240 ml  Output              1100 ml  Net             -860 ml   Filed Weights   06/19/16 0520 06/20/16 0409 06/21/16 0605  Weight: 79.4 kg (175 lb 1.6 oz) 79.5 kg (175 lb 3.2 oz) 79.6 kg (175 lb 6.4 oz)    Examination:  General exam: no distress, calm and comfortable  Respiratory system: No wheezing, no rhonchi, bilateral air entry  Cardiovascular system: S1 & S2 heard, Rate controlled, trace pedal edema. Gastrointestinal system: (+) BS, no distention, no tenderness  Central nervous system: Nonfocal  Extremities: Palpable pulses bilaterally, no tenderness  Skin: no lesions, no ulcers  Psychiatry: No agitation or restlessness   Data Reviewed: I have personally reviewed following labs and imaging studies  CBC:  Recent Labs Lab 06/14/16 1444 06/17/16 0451 06/20/16 0341 06/21/16 0544  WBC 8.9 10.0 9.4 8.9  HGB 11.2* 9.8* 10.9* 10.6*  HCT 35.1* 30.3* 33.5* 32.8*  MCV 96.4 92.1 93.1 92.9  PLT 169 194 213 195   Basic Metabolic Panel:  Recent Labs Lab 06/17/16 0451 06/18/16 0237 06/19/16 0401 06/20/16 0341 06/21/16 0544  NA 136 137 140 137 141  K 4.3 3.7 4.0 3.7 4.3  CL 102 100* 100* 100* 101  CO2 27 30 30 28 29   GLUCOSE 127* 95 103* 79 92  BUN 48* 55* 62* 63* 73*  CREATININE 2.23* 2.28* 2.35* 2.42* 2.72*  CALCIUM 9.0 8.7* 9.1 8.9 9.3   GFR: Estimated Creatinine Clearance: 13.8 mL/min (by C-G formula based on SCr of 2.72 mg/dL (H)). Liver Function Tests: No results for input(s): AST, ALT, ALKPHOS, BILITOT, PROT, ALBUMIN in the last 168 hours. No results for input(s): LIPASE, AMYLASE in the last 168 hours. No results for input(s): AMMONIA in the last 168 hours. Coagulation Profile:  Recent Labs Lab 06/17/16 0451 06/18/16 0237 06/19/16 0401 06/20/16 0341 06/21/16 0544  INR 1.07 1.16 0.99 1.08 1.12   Cardiac Enzymes: No results for input(s): CKTOTAL, CKMB, CKMBINDEX, TROPONINI in the last 168 hours. BNP (last 3 results) No results for input(s): PROBNP in the last 8760  hours. HbA1C: No results for input(s): HGBA1C in the last 72 hours. CBG:  Recent Labs Lab 06/20/16 0757 06/20/16 1124 06/20/16 1620 06/20/16 2245 06/21/16 0738  GLUCAP 91 82 115* 119* 99   Lipid Profile: No results for input(s): CHOL, HDL, LDLCALC, TRIG, CHOLHDL, LDLDIRECT in the last 72 hours. Thyroid Function Tests: No results for input(s): TSH, T4TOTAL, FREET4, T3FREE, THYROIDAB in the last 72 hours. Anemia Panel: No results for input(s): VITAMINB12, FOLATE, FERRITIN, TIBC, IRON, RETICCTPCT in the last 72 hours. Urine analysis:    Component Value Date/Time   COLORURINE YELLOW 06/19/2015 1339   APPEARANCEUR CLEAR 06/19/2015 1339   LABSPEC 1.007 06/19/2015 1339   PHURINE 6.0 06/19/2015 1339   GLUCOSEU NEGATIVE 06/19/2015 1339  HGBUR NEGATIVE 06/19/2015 1339   BILIRUBINUR NEGATIVE 06/19/2015 1339   KETONESUR NEGATIVE 06/19/2015 1339   PROTEINUR NEGATIVE 06/19/2015 1339   UROBILINOGEN 0.2 11/29/2014 1411   NITRITE NEGATIVE 06/19/2015 1339   LEUKOCYTESUR NEGATIVE 06/19/2015 1339   Sepsis Labs: @LABRCNTIP (procalcitonin:4,lacticidven:4)   Recent Results (from the past 240 hour(s))  MRSA PCR Screening     Status: Abnormal   Collection Time: 06/13/16 12:21 PM  Result Value Ref Range Status   MRSA by PCR POSITIVE (A) NEGATIVE Final      Radiology Studies: Nm Myocar Multi W/spect W/wall Motion / Ef Result Date: 06/16/2016 1. Infarction/scar involving the inferior wall and part of the apex. No reversible defects to suggest ischemia. 2. Wall motion abnormality involving the inferior wall and part of the apex. Moderate left ventricular dilatation. 3. Left ventricular ejection fraction 42% 4. Non invasive risk stratification*: High risk. *2012 Appropriate Use Criteria for Coronary Revascularization Focused Update: J Am Coll Cardiol. 2012;59(9):857-881. http://content.dementiazones.com.aspx?articleid=[email or web address] Electronically Signed   By: Rudie Meyer M.D.   On: 06/16/2016  09:20   Dg Chest Port 1 View Result Date: 06/15/2016 Markedly improved pulmonary edema. Cardiomegaly. Atherosclerosis.     Scheduled Meds: . amiodarone  100 mg Oral Daily  . atorvastatin  10 mg Oral q1800  . calcitRIOL  0.25 mcg Oral q morning - 10a  . carvedilol  25 mg Oral BID  . cycloSPORINE  1 drop Both Eyes BID  . donepezil  10 mg Oral QHS  . DULoxetine  60 mg Oral QPM  . febuxostat  40 mg Oral QPM  . heparin  5,000 Units Subcutaneous Q8H  . hydrALAZINE  37.5 mg Oral TID  . insulin aspart  0-15 Units Subcutaneous TID WC  . insulin aspart  0-5 Units Subcutaneous QHS  . isosorbide mononitrate  60 mg Oral Daily  . levothyroxine  75 mcg Oral QAC breakfast  . linaclotide  145 mcg Oral Daily  . loratadine  10 mg Oral Daily  . mouth rinse  15 mL Mouth Rinse BID  . memantine  5 mg Oral BID  . MUSCLE RUB   Topical BID  . pantoprazole  40 mg Oral Daily  . pregabalin  50 mg Oral 3 times per day  . senna  2 tablet Oral BID  . sodium chloride flush  3 mL Intravenous Q12H  . warfarin  3 mg Oral ONCE-1800  . Warfarin - Pharmacist Dosing Inpatient   Does not apply q1800   Continuous Infusions:   LOS: 8 days    Time spent: 25 minutes  Greater than 50% of the time spent on counseling and coordinating the care.   Manson Passey, MD [facility] Pager [phone]  If 7PM-7AM, please contact night-coverage [email or web address] Password [email or web address] 06/21/2016, 12:00 PM

## 2016-06-21 NOTE — Progress Notes (Signed)
Progress Note  Patient Name: [name] Date of Encounter: 06/21/2016  Primary Cardiologist: Dr. Excell Seltzer EP Dr. Ladona Ridgel  Subjective   83 y.o. female with a history of diastolic heart failure grade 1 an echocardiogram on 02/2015, CK D stage IV, aortic stenosis, dementia, chronic pain, hypertension, paroxysmal atrial fibrillation with chads 2 vasc score of 6 on coumadin. Patient is resident of [facility] center presents with onset of respiratory distress. She was found to be in respiratory distress by nursing home staff, was brought to the emergency room for evaluation. Patient has dementia  The patient is still having intermittent mild chest pressure, slightly better with addition of Imdur. Still having DOE with getting up to the [facility]. Edema has resolved.   Inpatient Medications    Scheduled Meds: . amiodarone  100 mg Oral Daily  . atorvastatin  10 mg Oral q1800  . calcitRIOL  0.25 mcg Oral q morning - 10a  . carvedilol  12.5 mg Oral BID  . cycloSPORINE  1 drop Both Eyes BID  . donepezil  10 mg Oral QHS  . DULoxetine  60 mg Oral QPM  . febuxostat  40 mg Oral QPM  . heparin  5,000 Units Subcutaneous Q8H  . hydrALAZINE  37.5 mg Oral TID  . insulin aspart  0-15 Units Subcutaneous TID WC  . insulin aspart  0-5 Units Subcutaneous QHS  . isosorbide mononitrate  60 mg Oral Daily  . levothyroxine  75 mcg Oral QAC breakfast  . linaclotide  145 mcg Oral Daily  . loratadine  10 mg Oral Daily  . mouth rinse  15 mL Mouth Rinse BID  . memantine  5 mg Oral BID  . MUSCLE RUB   Topical BID  . pantoprazole  40 mg Oral Daily  . pregabalin  50 mg Oral 3 times per day  . senna  2 tablet Oral BID  . sodium chloride flush  3 mL Intravenous Q12H  . warfarin  3 mg Oral ONCE-1800  . Warfarin - Pharmacist Dosing Inpatient   Does not apply q1800   Continuous Infusions:  PRN Meds: sodium chloride, acetaminophen, bisacodyl, HYDROcodone-acetaminophen, ipratropium-albuterol, ondansetron  (ZOFRAN) IV, simethicone, sodium chloride flush   Vital Signs    Vitals:   06/20/16 0409 06/20/16 1041 06/20/16 1954 06/21/16 0605  BP: (!) 158/73 (!) 123/55 (!) 111/52 (!) 156/79  Pulse: 78 73 68 72  Resp: 18  18 18   Temp: 97.9 F (36.6 C)  98 F (36.7 C) 97.9 F (36.6 C)  TempSrc: Oral  Oral Oral  SpO2: 100%  98% 98%  Weight: 175 lb 3.2 oz (79.5 kg)   175 lb 6.4 oz (79.6 kg)  Height:        Intake/Output Summary (Last 24 hours) at 06/21/16 1117 Last data filed at 06/21/16 0853  Gross per 24 hour  Intake              240 ml  Output             1100 ml  Net             -860 ml   Filed Weights   06/19/16 0520 06/20/16 0409 06/21/16 0605  Weight: 175 lb 1.6 oz (79.4 kg) 175 lb 3.2 oz (79.5 kg) 175 lb 6.4 oz (79.6 kg)    Telemetry    Predominantly A pacing - Personally Reviewed  ECG    No new   Physical Exam   GEN: No acute distress.  Neck: No JVD Cardiac: RRR, no murmurs, rubs, or gallops.  Respiratory: Lungs clear bilateraly. No rales GI: Soft, nontender, non-distended  MS: No edema; No deformity. Neuro:  Nonfocal  Psych: Normal affect   Labs    Chemistry  Recent Labs Lab 06/19/16 0401 06/20/16 0341 06/21/16 0544  NA 140 137 141  K 4.0 3.7 4.3  CL 100* 100* 101  CO2 30 28 29   GLUCOSE 103* 79 92  BUN 62* 63* 73*  CREATININE 2.35* 2.42* 2.72*  CALCIUM 9.1 8.9 9.3  GFRNONAA 18* 18* 15*  GFRAA 21* 20* 18*  ANIONGAP 10 9 11      Hematology  Recent Labs Lab 06/17/16 0451 06/20/16 0341 06/21/16 0544  WBC 10.0 9.4 8.9  RBC 3.29* 3.60* 3.53*  HGB 9.8* 10.9* 10.6*  HCT 30.3* 33.5* 32.8*  MCV 92.1 93.1 92.9  MCH 29.8 30.3 30.0  MCHC 32.3 32.5 32.3  RDW 15.3 15.8* 16.0*  PLT 194 213 195    Cardiac EnzymesNo results for input(s): TROPONINI in the last 168 hours.  No results for input(s): TROPIPOC in the last 168 hours.   BNP No results for input(s): BNP, PROBNP in the last 168 hours.   DDimer No results for input(s): DDIMER in the last  168 hours.   Radiology    No results found.  Cardiac Studies   Nuclear stress test 06/15/16 IMPRESSION:  1. Infarction/scar involving the inferior wall and part of the apex. No reversible defects to suggest ischemia. 2. Wall motion abnormality involving the inferior wall and part of the apex. Moderate left ventricular dilatation. 3. Left ventricular ejection fraction 42% 4. Non invasive risk stratification*: High risk.  TTE Study Conclusions  - Left ventricle: LVEF is approximately 35 to 40% with hypokinesis   of inferior, inferoseptal and anterior walls This is new compared   to echo of 2016. The cavity size was moderately dilated. Wall   thickness was normal. Features are consistent with a pseudonormal   left ventricular filling pattern, with concomitant abnormal   relaxation and increased filling pressure (grade 2 diastolic   dysfunction). Doppler parameters are consistent with high   ventricular filling pressure. - Aortic valve: AV prosthesis is difficult to see Peak and mean   gradients through the valve are 12 and 7 mm Hg respectively.   There is trace valvular AI Valve area (VTI): 1.82 cm^2. Valve   area (Vmax): 1.7 cm^2. Valve area (Vmean): 1.89 cm^2. - Mitral valve: There was mild to moderate regurgitation. - Left atrium: The atrium was mildly dilated. - Pulmonary arteries: PA peak pressure: 39 mm Hg (S).   Patient Profile     83 y.o. female with hx of cardiomyopathy, chronic systolic heart failure, and severe aortic stenosis. She underwent TAVR 12/21/2012 with a 29 mm Edwards Sapien XT valve. Other cardiac related problems include atrial fibrillation and hypertension. The patient has been chronically anticoagulated with warfarin. The patient has undergone pacemaker placement and she is followed by Dr. Ladona Ridgel.  Last seen by Dr. Excell Seltzer 01/2014.   Other hx includes, dementia, chronic pain, lives at [facility].   Now with Heart failure.  Assessment & Plan    Acute  on chronic systolic and diastolic heart failure -BNP 528, 2/23 -EF 35-40%. With wall motion abnormality -CXR 2/25 showed markedly improved pulmonary edema -Improved, on lasix 80 mg every 12 hours- stopped due to renal function.   -Edema is resolved. Lungs without rales. DOE and orthopnea much improved since admission. I/O  -[phone]  since admission. Weight stable.  -Continue coreg and hydralazine. -Nephrology consulted torsemide resumed by Dr. Hyman Hopes at 100 mg daily on 06/17/16, SCr rose and torsemide stopped yesterday. Will monitor fluid status.   CKD Stage 4  -Patients creatinine today continues to rise. SCr 2.42 today. Torsemide stopped. Last dose yesterday. When restart would start at lower dose.   Chest pain    -Nuclear stress test 06/15/16 showed Infarction/scar involving the inferior wall and part of the apex. No reversible defects to suggest ischemia. Wall motion abnormality involving the inferior wall and part of the apex. Moderate left ventricular dilatation. LVEF 42%. High risk study. -DOE is improved but still present. Has mild intermittent chest pressure.  -Plan medical therapy for now and follow up out patient- carvedilol, isosorbide and hydralazine. No aspirin as is on coumadin -isosorbide titrated yesterday to 60 mg.  Will increase Coreg to 25 PO BID to hopefully help with this chest tightness. Her BP is still elevated.   PAF  -maintaining SR on amiodarone and coreg -CHA2DS2VASc score of 8, is on coumadin. Coumadin resumed with no plan for cath. Pharmacy following.  INR today is 1.08  Hx of TAVR 2014.   -see Echo     Kristeen Miss, MD  06/21/2016 11:42 AM    [facility] [address] [place],  [address] [place], [place]  [address] Pager [phone] Phone: [phone]; Fax: [phone]

## 2016-06-22 LAB — CBC
HCT: 30 % — ABNORMAL LOW (ref 36.0–46.0)
Hemoglobin: 10 g/dL — ABNORMAL LOW (ref 12.0–15.0)
MCH: 30.9 pg (ref 26.0–34.0)
MCHC: 33.3 g/dL (ref 30.0–36.0)
MCV: 92.6 fL (ref 78.0–100.0)
PLATELETS: 183 10*3/uL (ref 150–400)
RBC: 3.24 MIL/uL — ABNORMAL LOW (ref 3.87–5.11)
RDW: 16.1 % — AB (ref 11.5–15.5)
WBC: 8.1 10*3/uL (ref 4.0–10.5)

## 2016-06-22 LAB — BASIC METABOLIC PANEL
Anion gap: 7 (ref 5–15)
BUN: 70 mg/dL — AB (ref 6–20)
CALCIUM: 8.8 mg/dL — AB (ref 8.9–10.3)
CO2: 28 mmol/L (ref 22–32)
CREATININE: 2.63 mg/dL — AB (ref 0.44–1.00)
Chloride: 101 mmol/L (ref 101–111)
GFR, EST AFRICAN AMERICAN: 18 mL/min — AB (ref >60)
GFR, EST NON AFRICAN AMERICAN: 16 mL/min — AB (ref >60)
Glucose, Bld: 83 mg/dL (ref 65–99)
Potassium: 4.3 mmol/L (ref 3.5–5.1)
SODIUM: 136 mmol/L (ref 135–145)

## 2016-06-22 LAB — GLUCOSE, CAPILLARY
GLUCOSE-CAPILLARY: 94 mg/dL (ref 65–99)
GLUCOSE-CAPILLARY: 139 mg/dL — AB (ref 65–99)
GLUCOSE-CAPILLARY: 107 mg/dL — AB (ref 65–99)
Glucose-Capillary: 120 mg/dL — ABNORMAL HIGH (ref 65–99)

## 2016-06-22 LAB — PROTIME-INR
INR: 1.45
PROTHROMBIN TIME: 17.8 s — AB (ref 11.4–15.2)

## 2016-06-22 LAB — TROPONIN I
TROPONIN I: 0.03 ng/mL — AB (ref <0.03)
Troponin I: 0.03 ng/mL (ref <0.03)

## 2016-06-22 LAB — D-DIMER, QUANTITATIVE (NOT AT ARMC): D DIMER QUANT: 0.36 ug{FEU}/mL (ref 0.00–0.50)

## 2016-06-22 MED ORDER — WARFARIN SODIUM 3 MG PO TABS
3.0000 mg | ORAL_TABLET | Freq: Once | ORAL | Status: AC
  Administered 2016-06-22: 3 mg via ORAL
  Filled 2016-06-22: qty 1

## 2016-06-22 NOTE — Progress Notes (Signed)
Progress Note  Patient Name: [name] Date of Encounter: 06/22/2016  Primary Cardiologist: Dr. Excell Seltzer EP Dr. Ladona Ridgel  Subjective   83 y.o. female with a history of diastolic heart failure grade 1 an echocardiogram on 02/2015, CK D stage IV, aortic stenosis, dementia, chronic pain, hypertension, paroxysmal atrial fibrillation with chads 2 vasc score of 6 on coumadin. Patient is resident of [facility] health center presents with onset of respiratory distress. She was found to be in respiratory distress by nursing home staff, was brought to the emergency room for evaluation. Patient has dementia  The patient is still having intermittent mild chest pressure, slightly better with addition of Imdur. Still having DOE with getting up to the [facility]. Edema has resolved.  Still having chest pain Describes more  Of a pleuritic CP today     Inpatient Medications    Scheduled Meds: . amiodarone  100 mg Oral Daily  . atorvastatin  10 mg Oral q1800  . calcitRIOL  0.25 mcg Oral q morning - 10a  . carvedilol  25 mg Oral BID  . cycloSPORINE  1 drop Both Eyes BID  . donepezil  10 mg Oral QHS  . DULoxetine  60 mg Oral QPM  . febuxostat  40 mg Oral QPM  . heparin  5,000 Units Subcutaneous Q8H  . hydrALAZINE  37.5 mg Oral TID  . insulin aspart  0-15 Units Subcutaneous TID WC  . insulin aspart  0-5 Units Subcutaneous QHS  . isosorbide mononitrate  60 mg Oral Daily  . levothyroxine  75 mcg Oral QAC breakfast  . linaclotide  145 mcg Oral Daily  . loratadine  10 mg Oral Daily  . mouth rinse  15 mL Mouth Rinse BID  . memantine  5 mg Oral BID  . MUSCLE RUB   Topical BID  . pantoprazole  40 mg Oral Daily  . pregabalin  50 mg Oral 3 times per day  . senna  2 tablet Oral BID  . sodium chloride flush  3 mL Intravenous Q12H  . warfarin  3 mg Oral ONCE-1800  . Warfarin - Pharmacist Dosing Inpatient   Does not apply q1800   Continuous Infusions:  PRN Meds: sodium chloride, acetaminophen,  bisacodyl, HYDROcodone-acetaminophen, ipratropium-albuterol, ondansetron (ZOFRAN) IV, simethicone, sodium chloride flush   Vital Signs    Vitals:   06/21/16 0605 06/21/16 1300 06/21/16 2034 06/22/16 0324  BP: (!) 156/79 (!) 99/49 124/65 (!) 141/62  Pulse: 72 83 88 74  Resp: 18 18 17 18   Temp: 97.9 F (36.6 C) 97.8 F (36.6 C) 98.4 F (36.9 C) 98.2 F (36.8 C)  TempSrc: Oral Oral Oral Oral  SpO2: 98% 94% 95% 97%  Weight: 175 lb 6.4 oz (79.6 kg)   177 lb 12.8 oz (80.6 kg)  Height:        Intake/Output Summary (Last 24 hours) at 06/22/16 1113 Last data filed at 06/22/16 0924  Gross per 24 hour  Intake              920 ml  Output             1651 ml  Net             -731 ml   Filed Weights   06/20/16 0409 06/21/16 0605 06/22/16 0324  Weight: 175 lb 3.2 oz (79.5 kg) 175 lb 6.4 oz (79.6 kg) 177 lb 12.8 oz (80.6 kg)    Telemetry    Predominantly A pacing - Personally Reviewed  ECG    No new   Physical Exam   GEN: No acute distress.   Neck: No JVD Cardiac: RRR, no murmurs, rubs, or gallops.  Respiratory: Lungs clear bilateraly. No rales GI: Soft, nontender, non-distended  MS: No edema; No deformity. Neuro:  Nonfocal  Psych: Normal affect   Labs    Chemistry  Recent Labs Lab 06/20/16 0341 06/21/16 0544 06/22/16 0627  NA 137 141 136  K 3.7 4.3 4.3  CL 100* 101 101  CO2 28 29 28   GLUCOSE 79 92 83  BUN 63* 73* 70*  CREATININE 2.42* 2.72* 2.63*  CALCIUM 8.9 9.3 8.8*  GFRNONAA 18* 15* 16*  GFRAA 20* 18* 18*  ANIONGAP 9 11 7      Hematology  Recent Labs Lab 06/20/16 0341 06/21/16 0544 06/22/16 0627  WBC 9.4 8.9 8.1  RBC 3.60* 3.53* 3.24*  HGB 10.9* 10.6* 10.0*  HCT 33.5* 32.8* 30.0*  MCV 93.1 92.9 92.6  MCH 30.3 30.0 30.9  MCHC 32.5 32.3 33.3  RDW 15.8* 16.0* 16.1*  PLT 213 195 183    Cardiac EnzymesNo results for input(s): TROPONINI in the last 168 hours.  No results for input(s): TROPIPOC in the last 168 hours.   BNP No results for  input(s): BNP, PROBNP in the last 168 hours.   DDimer No results for input(s): DDIMER in the last 168 hours.   Radiology    No results found.  Cardiac Studies   Nuclear stress test 06/15/16 IMPRESSION:  1. Infarction/scar involving the inferior wall and part of the apex. No reversible defects to suggest ischemia. 2. Wall motion abnormality involving the inferior wall and part of the apex. Moderate left ventricular dilatation. 3. Left ventricular ejection fraction 42% 4. Non invasive risk stratification*: High risk.  TTE Study Conclusions  - Left ventricle: LVEF is approximately 35 to 40% with hypokinesis   of inferior, inferoseptal and anterior walls This is new compared   to echo of 2016. The cavity size was moderately dilated. Wall   thickness was normal. Features are consistent with a pseudonormal   left ventricular filling pattern, with concomitant abnormal   relaxation and increased filling pressure (grade 2 diastolic   dysfunction). Doppler parameters are consistent with high   ventricular filling pressure. - Aortic valve: AV prosthesis is difficult to see Peak and mean   gradients through the valve are 12 and 7 mm Hg respectively.   There is trace valvular AI Valve area (VTI): 1.82 cm^2. Valve   area (Vmax): 1.7 cm^2. Valve area (Vmean): 1.89 cm^2. - Mitral valve: There was mild to moderate regurgitation. - Left atrium: The atrium was mildly dilated. - Pulmonary arteries: PA peak pressure: 39 mm Hg (S).   Patient Profile     83 y.o. female with hx of cardiomyopathy, chronic systolic heart failure, and severe aortic stenosis. She underwent TAVR 12/21/2012 with a 29 mm Edwards Sapien XT valve. Other cardiac related problems include atrial fibrillation and hypertension. The patient has been chronically anticoagulated with warfarin. The patient has undergone pacemaker placement and she is followed by Dr. Ladona Ridgel.  Last seen by Dr. Excell Seltzer 01/2014.   Other hx includes,  dementia, chronic pain, lives at [facility].   Now with Heart failure.  Assessment & Plan    Acute on chronic systolic and diastolic heart failure -BNP 528, 2/23 -EF 35-40%. With wall motion abnormality -CXR 2/25 showed markedly improved pulmonary edema -Improved, on lasix 80 mg every 12 hours- stopped due to renal function.   -  Edema is resolved. Lungs without rales. DOE and orthopnea much improved since admission. I/O  -5.4 L  since admission.   -Continue coreg and hydralazine. -Nephrology consulted torsemide resumed by Dr. Hyman Hopes at 100 mg daily on 06/17/16, SCr rose and torsemide stopped yesterday. Will monitor fluid status.   CKD Stage 4  -Patients creatinine today continues to rise.   Chest pain    -Nuclear stress test 06/15/16 showed Infarction/scar involving the inferior wall and part of the apex. No reversible defects to suggest ischemia. Wall motion abnormality involving the inferior wall and part of the apex. Moderate left ventricular dilatation. LVEF 42%. High risk study.   I increased Coreg to 25 PO BID but this did not really help much. The pain sound pleuretic today  Will get a d-dimer , will need a VQ scan if it is elevated.      PAF  -maintaining SR on amiodarone and coreg -CHA2DS2VASc score of 8, is on coumadin. Coumadin resumed with no plan for cath. Pharmacy following.  INR today is 1.08  Hx of TAVR 2014.   -see Echo     Kristeen Miss, MD  06/22/2016 11:13 AM    [facility] [address] [place],  [address] [place], [place]  [address] Pager [phone] Phone: [phone]; Fax: [phone]

## 2016-06-22 NOTE — Progress Notes (Addendum)
Patient ID: [name], female   DOB: [birth year], 83 y.o.   MRN: [id number]  PROGRESS NOTE    [name]  ION:[id number] DOB: [birth year] DOA: 06/13/2016  PCP: Oneal Grout, MD   Brief Narrative:  83 year old female with past medical history significant for chronic systolic and diastolic CHF, chronic kidney disease stage IV, aortic stenosis status post TAVR, history of dementia, paroxysmal atrial fibrillation on Coumadin patient lives in [place] skilled nursing facility. She was brought 06/13/2016 to [facility] for evaluation of acute respiratory distress. Patient was found to have acute combined systolic and diastolic congestive heart failure. She was seen by cardiology in consultation, was diuresed with symptom improvement. She underwent stress test 06/16/2016 with findings of infarction involving inferior wall and part of the apex with no reversible defects to suggest ischemia .   Assessment & Plan:   Principal Problem: Acute on chronic systolic and diastolic heart failure, NYHA class 3 (HCC) - Echo on this admission shows ejection fraction 35-40% with inferior, inferior septal and anterior walls hypokinesis which is new compared to echo in 2016, grade 2 diastolic dysfunction - Patient was on Lasix 80 mg every 12 hours but this was stopped because of renal insufficiency - She was then switched to torsemide 100 mg daily - Torsemide on hold due to worsening renal function  - Her Cr finally trending down this am - Continue isosorbide 60 mg daily and hydralazine 37.5 mg PO TID - Appreciate cardio following   Active Problems:   Paroxysmal atrial fibrillation (HCC) - CHADS vasc score 6 - Continue rate controlled with amiodarone 100 mg daily and Coreg 25 mg BID - Continue Coumadin  - INR subtherapeutic     Essential hypertension - Continue Coreg 25 mg BID, hydralazine 37.5 mg PO TID and isosorbide 60 mg daily     Dementia without behavioral disturbance - Continue Namenda 5 mg  twice daily, Aricept 10 mg at bedtime, Cymbalta 60 mg at bedtime    Chest pain - No obstructive disease in 2014 - Stress test on this admission showed infarction involving the inferior wall and part of the apex with no reversible defects to suggest ischemia, ejection fraction 42%, high risk study - Per cardio, obtain D dimer and if elevated will need VQ scan to eval for pulm embolism     Hypothyroidism - Continue Synthroid 75 g daily    Dyslipidemia - Continue Lipitor 10 mg at bedtime    CKD (chronic kidney disease) stage 4, GFR 15-29 ml/min (HCC) - Baseline creatinine in September 2017 as high as 3.1 - Torsemide stopped 3/3 - Cr improved this am    Anemia of chronic kidney disease  - Hemoglobin stable at 10    Depression  - Continue Cymbalta. Lyrica    DVT prophylaxis: On anticoagulation with Coumadin Code Status: full code  Family Communication: Spoke with patient's son over the phone 3/1 Disposition Plan: to SNF once cleared by cardio    Consultants:   Cardiology  Nephrology  Procedures:   Echo 06/13/2016 showed ejection fraction 35-40% with inferior hypokinesis, inferior septal and anterior walls hypokinesis which is new compared to echo in 2016; grade 2 diastolic dysfunction  Antimicrobials:   None   Subjective: Still with chest pain this am.  Objective: Vitals:   06/21/16 1300 06/21/16 2034 06/22/16 0324 06/22/16 1222  BP: (!) 99/49 124/65 (!) 141/62 (!) 116/57  Pulse: 83 88 74 79  Resp: 18 17 18 18   Temp: 97.8 F (36.6 C)  98.4 F (36.9 C) 98.2 F (36.8 C) 98.4 F (36.9 C)  TempSrc: Oral Oral Oral Oral  SpO2: 94% 95% 97% 96%  Weight:   80.6 kg (177 lb 12.8 oz)   Height:        Intake/Output Summary (Last 24 hours) at 06/22/16 1309 Last data filed at 06/22/16 1223  Gross per 24 hour  Intake              680 ml  Output             1352 ml  Net             -672 ml   Filed Weights   06/20/16 0409 06/21/16 0605 06/22/16 0324  Weight: 79.5  kg (175 lb 3.2 oz) 79.6 kg (175 lb 6.4 oz) 80.6 kg (177 lb 12.8 oz)    Examination:  General exam: no distress Respiratory system: No wheezing, no rhonchi Cardiovascular system: S1 & S2 heard, Rate controlled, trace pedal edema. Gastrointestinal system: (+) BS, no distention Central nervous system: No focal deficits  Extremities: Palpable pulses bilaterally, no tenderness  Skin: warm and dry   Psychiatry: Normal mood and behavior   Data Reviewed: I have personally reviewed following labs and imaging studies  CBC:  Recent Labs Lab 06/17/16 0451 06/20/16 0341 06/21/16 0544 06/22/16 0627  WBC 10.0 9.4 8.9 8.1  HGB 9.8* 10.9* 10.6* 10.0*  HCT 30.3* 33.5* 32.8* 30.0*  MCV 92.1 93.1 92.9 92.6  PLT 194 213 195 183   Basic Metabolic Panel:  Recent Labs Lab 06/18/16 0237 06/19/16 0401 06/20/16 0341 06/21/16 0544 06/22/16 0627  NA 137 140 137 141 136  K 3.7 4.0 3.7 4.3 4.3  CL 100* 100* 100* 101 101  CO2 30 30 28 29 28   GLUCOSE 95 103* 79 92 83  BUN 55* 62* 63* 73* 70*  CREATININE 2.28* 2.35* 2.42* 2.72* 2.63*  CALCIUM 8.7* 9.1 8.9 9.3 8.8*   GFR: Estimated Creatinine Clearance: 14.4 mL/min (by C-G formula based on SCr of 2.63 mg/dL (H)). Liver Function Tests: No results for input(s): AST, ALT, ALKPHOS, BILITOT, PROT, ALBUMIN in the last 168 hours. No results for input(s): LIPASE, AMYLASE in the last 168 hours. No results for input(s): AMMONIA in the last 168 hours. Coagulation Profile:  Recent Labs Lab 06/18/16 0237 06/19/16 0401 06/20/16 0341 06/21/16 0544 06/22/16 0627  INR 1.16 0.99 1.08 1.12 1.45   Cardiac Enzymes: No results for input(s): CKTOTAL, CKMB, CKMBINDEX, TROPONINI in the last 168 hours. BNP (last 3 results) No results for input(s): PROBNP in the last 8760 hours. HbA1C: No results for input(s): HGBA1C in the last 72 hours. CBG:  Recent Labs Lab 06/21/16 1206 06/21/16 1557 06/21/16 2150 06/22/16 0745 06/22/16 1132  GLUCAP 164* 107*  94 94 120*   Lipid Profile: No results for input(s): CHOL, HDL, LDLCALC, TRIG, CHOLHDL, LDLDIRECT in the last 72 hours. Thyroid Function Tests: No results for input(s): TSH, T4TOTAL, FREET4, T3FREE, THYROIDAB in the last 72 hours. Anemia Panel: No results for input(s): VITAMINB12, FOLATE, FERRITIN, TIBC, IRON, RETICCTPCT in the last 72 hours. Urine analysis:    Component Value Date/Time   COLORURINE YELLOW 06/19/2015 1339   APPEARANCEUR CLEAR 06/19/2015 1339   LABSPEC 1.007 06/19/2015 1339   PHURINE 6.0 06/19/2015 1339   GLUCOSEU NEGATIVE 06/19/2015 1339   HGBUR NEGATIVE 06/19/2015 1339   BILIRUBINUR NEGATIVE 06/19/2015 1339   KETONESUR NEGATIVE 06/19/2015 1339   PROTEINUR NEGATIVE 06/19/2015 1339   UROBILINOGEN 0.2 11/29/2014 1411  NITRITE NEGATIVE 06/19/2015 1339   LEUKOCYTESUR NEGATIVE 06/19/2015 1339   Sepsis Labs: @LABRCNTIP (procalcitonin:4,lacticidven:4)   Recent Results (from the past 240 hour(s))  MRSA PCR Screening     Status: Abnormal   Collection Time: 06/13/16 12:21 PM  Result Value Ref Range Status   MRSA by PCR POSITIVE (A) NEGATIVE Final      Radiology Studies: Nm Myocar Multi W/spect W/wall Motion / Ef Result Date: 06/16/2016 1. Infarction/scar involving the inferior wall and part of the apex. No reversible defects to suggest ischemia. 2. Wall motion abnormality involving the inferior wall and part of the apex. Moderate left ventricular dilatation. 3. Left ventricular ejection fraction 42% 4. Non invasive risk stratification*: High risk. *2012 Appropriate Use Criteria for Coronary Revascularization Focused Update: J Am Coll Cardiol. 2012;59(9):857-881. http://content.[email or web address]?articleid=[email or web address] Electronically Signed   By: Rudie Meyer M.D.   On: 06/16/2016 09:20   Dg Chest Port 1 View Result Date: 06/15/2016 Markedly improved pulmonary edema. Cardiomegaly. Atherosclerosis.     Scheduled Meds: . amiodarone  100 mg Oral Daily  .  atorvastatin  10 mg Oral q1800  . calcitRIOL  0.25 mcg Oral q morning - 10a  . carvedilol  25 mg Oral BID  . cycloSPORINE  1 drop Both Eyes BID  . donepezil  10 mg Oral QHS  . DULoxetine  60 mg Oral QPM  . febuxostat  40 mg Oral QPM  . heparin  5,000 Units Subcutaneous Q8H  . hydrALAZINE  37.5 mg Oral TID  . insulin aspart  0-15 Units Subcutaneous TID WC  . insulin aspart  0-5 Units Subcutaneous QHS  . isosorbide mononitrate  60 mg Oral Daily  . levothyroxine  75 mcg Oral QAC breakfast  . linaclotide  145 mcg Oral Daily  . loratadine  10 mg Oral Daily  . mouth rinse  15 mL Mouth Rinse BID  . memantine  5 mg Oral BID  . MUSCLE RUB   Topical BID  . pantoprazole  40 mg Oral Daily  . pregabalin  50 mg Oral 3 times per day  . senna  2 tablet Oral BID  . sodium chloride flush  3 mL Intravenous Q12H  . warfarin  3 mg Oral ONCE-1800  . Warfarin - Pharmacist Dosing Inpatient   Does not apply q1800   Continuous Infusions:   LOS: 9 days    Time spent: 25 minutes  Greater than 50% of the time spent on counseling and coordinating the care.   Manson Passey, MD [facility] Pager [phone]  If 7PM-7AM, please contact night-coverage [email or web address] Password [email or web address] 06/22/2016, 1:09 PM

## 2016-06-22 NOTE — Progress Notes (Signed)
Pt is alert times 4, vitals stable,  NSR on monitor, no any other specific complaints at this point, will continue to monitor the patient. 

## 2016-06-23 ENCOUNTER — Inpatient Hospital Stay (HOSPITAL_COMMUNITY): Payer: Medicare Other

## 2016-06-23 LAB — CBC
HEMATOCRIT: 34 % — AB (ref 36.0–46.0)
HEMOGLOBIN: 11 g/dL — AB (ref 12.0–15.0)
MCH: 30.2 pg (ref 26.0–34.0)
MCHC: 32.4 g/dL (ref 30.0–36.0)
MCV: 93.4 fL (ref 78.0–100.0)
Platelets: 213 10*3/uL (ref 150–400)
RBC: 3.64 MIL/uL — ABNORMAL LOW (ref 3.87–5.11)
RDW: 16.2 % — AB (ref 11.5–15.5)
WBC: 8.9 10*3/uL (ref 4.0–10.5)

## 2016-06-23 LAB — BASIC METABOLIC PANEL
Anion gap: 7 (ref 5–15)
BUN: 62 mg/dL — AB (ref 6–20)
CO2: 27 mmol/L (ref 22–32)
CREATININE: 2.56 mg/dL — AB (ref 0.44–1.00)
Calcium: 9.3 mg/dL (ref 8.9–10.3)
Chloride: 103 mmol/L (ref 101–111)
GFR calc non Af Amer: 16 mL/min — ABNORMAL LOW (ref >60)
GFR, EST AFRICAN AMERICAN: 19 mL/min — AB (ref >60)
Glucose, Bld: 112 mg/dL — ABNORMAL HIGH (ref 65–99)
POTASSIUM: 4.8 mmol/L (ref 3.5–5.1)
SODIUM: 137 mmol/L (ref 135–145)

## 2016-06-23 LAB — TROPONIN I: Troponin I: <0.03 ng/mL (ref <0.03)

## 2016-06-23 LAB — GLUCOSE, CAPILLARY
GLUCOSE-CAPILLARY: 106 mg/dL — AB (ref 65–99)
Glucose-Capillary: 99 mg/dL (ref 65–99)
Glucose-Capillary: 110 mg/dL — ABNORMAL HIGH (ref 65–99)
Glucose-Capillary: 129 mg/dL — ABNORMAL HIGH (ref 65–99)

## 2016-06-23 LAB — PROTIME-INR
INR: 1.58
PROTHROMBIN TIME: 19 s — AB (ref 11.4–15.2)

## 2016-06-23 MED ORDER — WARFARIN SODIUM 3 MG PO TABS
3.0000 mg | ORAL_TABLET | Freq: Once | ORAL | Status: AC
  Administered 2016-06-23: 3 mg via ORAL
  Filled 2016-06-23: qty 1

## 2016-06-23 NOTE — Progress Notes (Signed)
Patient ID: [name], female   DOB: [birth year], 83 y.o.   MRN: [id number]  PROGRESS NOTE    KASHARI CHALMERS  WUJ:[id number] DOB: 10/16/1932 DOA: 06/13/2016  PCP: Oneal Grout, MD   Brief Narrative:  83 year old female with past medical history significant for chronic systolic and diastolic CHF, chronic kidney disease stage IV, aortic stenosis status post TAVR, history of dementia, paroxysmal atrial fibrillation on Coumadin patient lives in [place] skilled nursing facility. She was brought 06/13/2016 to [facility] for evaluation of acute respiratory distress. Patient was found to have acute combined systolic and diastolic congestive heart failure. She was seen by cardiology in consultation, was diuresed with symptom improvement. She underwent stress test 06/16/2016 with findings of infarction involving inferior wall and part of the apex with no reversible defects to suggest ischemia .   Assessment & Plan:   Principal Problem: Acute on chronic systolic and diastolic heart failure, NYHA class 3 (HCC) - Echo on this admission shows ejection fraction 35-40% with inferior, inferior septal and anterior walls hypokinesis which is new compared to echo in 2016, grade 2 diastolic dysfunction - Patient was on Lasix 80 mg every 12 hours but this was stopped because of renal insufficiency - She was then switched to torsemide 100 mg daily. Torsemide then placed on hold 3/3 due to worsening renal function  - Continue isosorbide 60 mg daily and hydralazine 37.5 mg PO TID - Appreciate cardio following and their input   Active Problems:   Paroxysmal atrial fibrillation (HCC) - CHADS vasc score 6 - Continue rate controlled with amiodarone 100 mg daily and Coreg 25 mg BID - Continue Coumadin  - INR subtherapeutic this am    Essential hypertension - Continue Coreg 25 mg BID, hydralazine 37.5 mg PO TID and isosorbide 60 mg daily     Dementia without behavioral disturbance - Continue Namenda 5 mg  twice daily, Aricept 10 mg at bedtime, Cymbalta 60 mg at bedtime - Stable mental status     Chest pain - No obstructive disease in 2014 - Stress test on this admission showed infarction involving the inferior wall and part of the apex with no reversible defects to suggest ischemia, ejection fraction 42%, high risk study - D dimer negative      Hypothyroidism - Continue Synthroid 75 g daily    Dyslipidemia - Continue Lipitor 10 mg at bedtime    CKD (chronic kidney disease) stage 4, GFR 15-29 ml/min (HCC) - Baseline creatinine in September 2017 as high as 3.1 - Torsemide stopped 3/3 - Cr improving, 2.63 --> 2.56     Anemia of chronic kidney disease  - Hemoglobin stable at 10, 11    Depression  - Continue Cymbalta. Lyrica    DVT prophylaxis: On anticoagulation with Coumadin Code Status: full code  Family Communication: family not at the bedside this am Disposition Plan: to SNF once cleared by cardio    Consultants:   Cardiology  Nephrology  Procedures:   Echo 06/13/2016 showed ejection fraction 35-40% with inferior hypokinesis, inferior septal and anterior walls hypokinesis which is new compared to echo in 2016; grade 2 diastolic dysfunction  Antimicrobials:   None   Subjective: No overnight events.   Objective: Vitals:   06/23/16 0525 06/23/16 0955 06/23/16 1219 06/23/16 1536  BP: 123/61 (!) 139/57 110/66 (!) 118/50  Pulse: (!) 57 79 83   Resp:   20   Temp: 98.2 F (36.8 C)  98.2 F (36.8 C)   TempSrc: Oral  Oral   SpO2: 93%  94%   Weight: 81.1 kg (178 lb 14.4 oz)     Height:        Intake/Output Summary (Last 24 hours) at 06/23/16 1622 Last data filed at 06/23/16 1323  Gross per 24 hour  Intake              780 ml  Output             1835 ml  Net            -1055 ml   Filed Weights   06/21/16 0605 06/22/16 0324 06/23/16 0525  Weight: 79.6 kg (175 lb 6.4 oz) 80.6 kg (177 lb 12.8 oz) 81.1 kg (178 lb 14.4 oz)    Examination:  General exam:  no distress, calm and comfortable  Respiratory system: No wheezing, no rhonchi Cardiovascular system: S1 & S2 heard, Rate controlled Gastrointestinal system: (+) BS, no distention Central nervous system: Nonfocal   Extremities: Palpable pulses bilaterally Skin: warm and dry, no lesions, no ulcers  Psychiatry: Normal mood and behavior, no agitation or restlessness   Data Reviewed: I have personally reviewed following labs and imaging studies  CBC:  Recent Labs Lab 06/17/16 0451 06/20/16 0341 06/21/16 0544 06/22/16 0627 06/23/16 0843  WBC 10.0 9.4 8.9 8.1 8.9  HGB 9.8* 10.9* 10.6* 10.0* 11.0*  HCT 30.3* 33.5* 32.8* 30.0* 34.0*  MCV 92.1 93.1 92.9 92.6 93.4  PLT 194 213 195 183 213   Basic Metabolic Panel:  Recent Labs Lab 06/19/16 0401 06/20/16 0341 06/21/16 0544 06/22/16 0627 06/23/16 0843  NA 140 137 141 136 137  K 4.0 3.7 4.3 4.3 4.8  CL 100* 100* 101 101 103  CO2 30 28 29 28 27   GLUCOSE 103* 79 92 83 112*  BUN 62* 63* 73* 70* 62*  CREATININE 2.35* 2.42* 2.72* 2.63* 2.56*  CALCIUM 9.1 8.9 9.3 8.8* 9.3   GFR: Estimated Creatinine Clearance: 14.9 mL/min (by C-G formula based on SCr of 2.56 mg/dL (H)). Liver Function Tests: No results for input(s): AST, ALT, ALKPHOS, BILITOT, PROT, ALBUMIN in the last 168 hours. No results for input(s): LIPASE, AMYLASE in the last 168 hours. No results for input(s): AMMONIA in the last 168 hours. Coagulation Profile:  Recent Labs Lab 06/19/16 0401 06/20/16 0341 06/21/16 0544 06/22/16 0627 06/23/16 0049  INR 0.99 1.08 1.12 1.45 1.58   Cardiac Enzymes:  Recent Labs Lab 06/22/16 1414 06/22/16 1811 06/23/16 0049  TROPONINI 0.03* 0.03* <0.03   BNP (last 3 results) No results for input(s): PROBNP in the last 8760 hours. HbA1C: No results for input(s): HGBA1C in the last 72 hours. CBG:  Recent Labs Lab 06/22/16 1132 06/22/16 1652 06/22/16 2233 06/23/16 0750 06/23/16 1141  GLUCAP 120* 139* 107* 99 106*    Lipid Profile: No results for input(s): CHOL, HDL, LDLCALC, TRIG, CHOLHDL, LDLDIRECT in the last 72 hours. Thyroid Function Tests: No results for input(s): TSH, T4TOTAL, FREET4, T3FREE, THYROIDAB in the last 72 hours. Anemia Panel: No results for input(s): VITAMINB12, FOLATE, FERRITIN, TIBC, IRON, RETICCTPCT in the last 72 hours. Urine analysis:    Component Value Date/Time   COLORURINE YELLOW 06/19/2015 1339   APPEARANCEUR CLEAR 06/19/2015 1339   LABSPEC 1.007 06/19/2015 1339   PHURINE 6.0 06/19/2015 1339   GLUCOSEU NEGATIVE 06/19/2015 1339   HGBUR NEGATIVE 06/19/2015 1339   BILIRUBINUR NEGATIVE 06/19/2015 1339   KETONESUR NEGATIVE 06/19/2015 1339   PROTEINUR NEGATIVE 06/19/2015 1339   UROBILINOGEN 0.2 11/29/2014 1411  NITRITE NEGATIVE 06/19/2015 1339   LEUKOCYTESUR NEGATIVE 06/19/2015 1339   Sepsis Labs: @[email or web address] (procalcitonin:4,lacticidven:4)   Recent Results (from the past 240 hour(s))  MRSA PCR Screening     Status: Abnormal   Collection Time: 06/13/16 12:21 PM  Result Value Ref Range Status   MRSA by PCR POSITIVE (A) NEGATIVE Final      Radiology Studies: Nm Myocar Multi W/spect W/wall Motion / Ef Result Date: 06/16/2016 1. Infarction/scar involving the inferior wall and part of the apex. No reversible defects to suggest ischemia. 2. Wall motion abnormality involving the inferior wall and part of the apex. Moderate left ventricular dilatation. 3. Left ventricular ejection fraction 42% 4. Non invasive risk stratification*: High risk. *2012 Appropriate Use Criteria for Coronary Revascularization Focused Update: J Am Coll Cardiol. 2012;59(9):857-881. http://[email or web address]?[email or web address] Electronically Signed   By: Rudie Meyer M.D.   On: 06/16/2016 09:20   Dg Chest Port 1 View Result Date: 06/15/2016 Markedly improved pulmonary edema. Cardiomegaly. Atherosclerosis.     Scheduled Meds: . amiodarone  100 mg Oral Daily  . atorvastatin  10  mg Oral q1800  . calcitRIOL  0.25 mcg Oral q morning - 10a  . carvedilol  25 mg Oral BID  . cycloSPORINE  1 drop Both Eyes BID  . donepezil  10 mg Oral QHS  . DULoxetine  60 mg Oral QPM  . febuxostat  40 mg Oral QPM  . heparin  5,000 Units Subcutaneous Q8H  . hydrALAZINE  37.5 mg Oral TID  . insulin aspart  0-15 Units Subcutaneous TID WC  . insulin aspart  0-5 Units Subcutaneous QHS  . isosorbide mononitrate  60 mg Oral Daily  . levothyroxine  75 mcg Oral QAC breakfast  . linaclotide  145 mcg Oral Daily  . loratadine  10 mg Oral Daily  . mouth rinse  15 mL Mouth Rinse BID  . memantine  5 mg Oral BID  . MUSCLE RUB   Topical BID  . pantoprazole  40 mg Oral Daily  . pregabalin  50 mg Oral 3 times per day  . senna  2 tablet Oral BID  . sodium chloride flush  3 mL Intravenous Q12H  . warfarin  3 mg Oral ONCE-1800  . Warfarin - Pharmacist Dosing Inpatient   Does not apply q1800   Continuous Infusions:   LOS: 10 days    Time spent: 15 minutes  Greater than 50% of the time spent on counseling and coordinating the care.   Manson Passey, MD [facility] Pager [phone]  If 7PM-7AM, please contact night-coverage [email or web address] Password [email or web address] 06/23/2016, 4:22 PM

## 2016-06-23 NOTE — Progress Notes (Signed)
Progress Note  Patient Name: JAILINE LIEDER Date of Encounter: 06/23/2016  Primary Cardiologist: Dr Excell Seltzer 01/2014 EP: Dr Ladona Ridgel  Patient Profile     83 y.o. female w/ hx NICM, chronic systolic heart failure, and severe AS s/p TAVR 12/21/2012 w/ 29 mm Edwards Sapien XT valve. Hx atrial fibrillation, HTN, chronic anticoagulation w/ warfarin. s/p PPM by Dr. Ladona Ridgel.Other hx includes, dementia, chronic pain, lives at [facility]. Admitted 02/23 with Heart failure.  Subjective   No chest pain, activity level is poor at baseline, uses a wheelchair with minimal ambulation Breathing better than on admit, not sure she is back to baseline  Inpatient Medications    Scheduled Meds: . amiodarone  100 mg Oral Daily  . atorvastatin  10 mg Oral q1800  . calcitRIOL  0.25 mcg Oral q morning - 10a  . carvedilol  25 mg Oral BID  . cycloSPORINE  1 drop Both Eyes BID  . donepezil  10 mg Oral QHS  . DULoxetine  60 mg Oral QPM  . febuxostat  40 mg Oral QPM  . heparin  5,000 Units Subcutaneous Q8H  . hydrALAZINE  37.5 mg Oral TID  . insulin aspart  0-15 Units Subcutaneous TID WC  . insulin aspart  0-5 Units Subcutaneous QHS  . isosorbide mononitrate  60 mg Oral Daily  . levothyroxine  75 mcg Oral QAC breakfast  . linaclotide  145 mcg Oral Daily  . loratadine  10 mg Oral Daily  . mouth rinse  15 mL Mouth Rinse BID  . memantine  5 mg Oral BID  . MUSCLE RUB   Topical BID  . pantoprazole  40 mg Oral Daily  . pregabalin  50 mg Oral 3 times per day  . senna  2 tablet Oral BID  . sodium chloride flush  3 mL Intravenous Q12H  . Warfarin - Pharmacist Dosing Inpatient   Does not apply q1800   Continuous Infusions:  PRN Meds: sodium chloride, acetaminophen, bisacodyl, HYDROcodone-acetaminophen, ipratropium-albuterol, ondansetron (ZOFRAN) IV, simethicone, sodium chloride flush   Vital Signs    Vitals:   06/22/16 0324 06/22/16 1222 06/22/16 2037 06/23/16 0525  BP: (!) 141/62 (!) 116/57 (!)  116/50 123/61  Pulse: 74 79 60 (!) 57  Resp: 18 18 18    Temp: 98.2 F (36.8 C) 98.4 F (36.9 C) 97.9 F (36.6 C) 98.2 F (36.8 C)  TempSrc: Oral Oral Oral Oral  SpO2: 97% 96% 95% 93%  Weight: 177 lb 12.8 oz (80.6 kg)   178 lb 14.4 oz (81.1 kg)  Height:        Intake/Output Summary (Last 24 hours) at 06/23/16 0849 Last data filed at 06/23/16 0744  Gross per 24 hour  Intake              960 ml  Output             1597 ml  Net             -637 ml   Filed Weights   06/21/16 0605 06/22/16 0324 06/23/16 0525  Weight: 175 lb 6.4 oz (79.6 kg) 177 lb 12.8 oz (80.6 kg) 178 lb 14.4 oz (81.1 kg)    Telemetry    A pacing - Personally Reviewed  ECG    n/a - Personally Reviewed  Physical Exam   General: Well developed, well nourished, female appearing in no acute distress. Head: Normocephalic, atraumatic.  Neck: Supple without bruits, JVD 8-9 cm. Lungs:  Resp regular and unlabored,  bilateral rales. Heart: RRR, S1, S2, no S3, S4, 2/6 murmur; no rub. Abdomen: Soft, non-tender, non-distended with normoactive bowel sounds. No hepatomegaly. No rebound/guarding. No obvious abdominal masses. Extremities: No clubbing, cyanosis, no edema. Distal pedal pulses are 2+ bilaterally. Neuro: Alert and oriented X 3. Moves all extremities spontaneously. Psych: Normal affect.  Labs    Hematology Recent Labs Lab 06/20/16 0341 06/21/16 0544 06/22/16 0627  WBC 9.4 8.9 8.1  RBC 3.60* 3.53* 3.24*  HGB 10.9* 10.6* 10.0*  HCT 33.5* 32.8* 30.0*  MCV 93.1 92.9 92.6  MCH 30.3 30.0 30.9  MCHC 32.5 32.3 33.3  RDW 15.8* 16.0* 16.1*  PLT 213 195 183    Chemistry Recent Labs Lab 06/20/16 0341 06/21/16 0544 06/22/16 0627  NA 137 141 136  K 3.7 4.3 4.3  CL 100* 101 101  CO2 28 29 28   GLUCOSE 79 92 83  BUN 63* 73* 70*  CREATININE 2.42* 2.72* 2.63*  CALCIUM 8.9 9.3 8.8*  GFRNONAA 18* 15* 16*  GFRAA 20* 18* 18*  ANIONGAP 9 11 7      Cardiac Enzymes Recent Labs Lab 06/22/16 1414  06/22/16 1811 06/23/16 0049  TROPONINI 0.03* 0.03* <0.03   Lab Results  Component Value Date   INR 1.58 06/23/2016   INR 1.45 06/22/2016   INR 1.12 06/21/2016   BNP    Component Value Date/Time   BNP 528.3 (H) 06/13/2016 0530   DDimer  Recent Labs Lab 06/22/16 1414  DDIMER 0.36     Radiology    Nm Myocar Multi W/spect W/wall Motion / Ef Result Date: 06/16/2016 CLINICAL DATA:  Chest pain. EXAM: MYOCARDIAL IMAGING WITH SPECT (REST AND PHARMACOLOGIC-STRESS) GATED LEFT VENTRICULAR WALL MOTION STUDY LEFT VENTRICULAR EJECTION FRACTION TECHNIQUE: Standard myocardial SPECT imaging was performed after resting intravenous injection of 10 mCi Tc-53m tetrofosmin. Subsequently, intravenous infusion of Lexiscan was performed under the supervision of the Cardiology staff. At peak effect of the drug, 30 mCi Tc-58m tetrofosmin was injected intravenously and standard myocardial SPECT imaging was performed. Quantitative gated imaging was also performed to evaluate left ventricular wall motion, and estimate left ventricular ejection fraction. COMPARISON:  None. FINDINGS: Perfusion: Moderate to large fixed defect involving the inferior wall and part of the apex consistent with scar. No reversible defects to suggest ischemia. Wall Motion: Wall motion abnormality involving the inferior wall and apex. Moderate left ventricular dilatation. Left Ventricular Ejection Fraction: 42 % End diastolic volume 122 ml End systolic volume 70 ml IMPRESSION: 1. Infarction/scar involving the inferior wall and part of the apex. No reversible defects to suggest ischemia. 2. Wall motion abnormality involving the inferior wall and part of the apex. Moderate left ventricular dilatation. 3. Left ventricular ejection fraction 42% 4. Non invasive risk stratification*: High risk. *2012 Appropriate Use Criteria for Coronary Revascularization Focused Update: J Am Coll Cardiol. 2012;59(9):857-881.  http://content.dementiazones.com.aspx?articleid=[email or web address] Electronically Signed   By: Rudie Meyer M.D.   On: 06/16/2016 09:20   Dg Chest Port 1 View Result Date: 06/15/2016 CLINICAL DATA:  Patient admitted with respiratory distress 06/13/2016. EXAM: PORTABLE CHEST 1 VIEW COMPARISON:  Single-view of the chest 06/13/2016. CT chest 12/14/2014. PA and lateral chest 11/29/2014. FINDINGS: There is cardiomegaly. The patient is status post aortic valve repair. Pacing device is in place. Atherosclerosis is noted. Pulmonary edema seen on the most recent examination is markedly improved. No pneumothorax or pleural effusion. IMPRESSION: Markedly improved pulmonary edema. Cardiomegaly. Atherosclerosis. Electronically Signed   By: Drusilla Kanner M.D.   On: 06/15/2016 14:15   Dg  Chest Port 1 View Result Date: 06/13/2016 CLINICAL DATA:  83 year old female with shortness of breath. EXAM: PORTABLE CHEST 1 VIEW COMPARISON:  Chest CT dated 12/14/2014 under radiograph dated 11/29/2014 FINDINGS: There is stable cardiomegaly. An aortic valve stent is noted. Central vascular prominence and diffuse interstitial densities noted likely mild vascular congestion and edema. Bilateral lower lung field hazy densities may represent atelectasis versus infiltrate. There is blunting of the left costophrenic angle which may represent a small pleural effusion. There is no pneumothorax. Right pectoral pacemaker device noted. No acute osseous pathology. IMPRESSION: 1. Cardiomegaly with probable mild congestive changes and interstitial edema. 2. Bibasilar atelectasis versus infiltrate. 3. Probable small left pleural effusion. Electronically Signed   By: Elgie Collard M.D.   On: 06/13/2016 05:18     Cardiac Studies   ECHO: 06/13/2016 - Left ventricle: LVEF is approximately 35 to 40% with hypokinesis   of inferior, inferoseptal and anterior walls This is new compared   to echo of 2016. The cavity size was moderately dilated. Wall    thickness was normal. Features are consistent with a pseudonormal   left ventricular filling pattern, with concomitant abnormal   relaxation and increased filling pressure (grade 2 diastolic   dysfunction). Doppler parameters are consistent with high   ventricular filling pressure. - Aortic valve: AV prosthesis is difficult to see Peak and mean   gradients through the valve are 12 and 7 mm Hg respectively.   There is trace valvular AI Valve area (VTI): 1.82 cm^2. Valve   area (Vmax): 1.7 cm^2. Valve area (Vmean): 1.89 cm^2. - Mitral valve: There was mild to moderate regurgitation. - Left atrium: The atrium was mildly dilated. - Pulmonary arteries: PA peak pressure: 39 mm Hg (S).  Myoview: 06/22/2016 IMPRESSION: 1. Infarction/scar involving the inferior wall and part of the apex. No reversible defects to suggest ischemia. 2. Wall motion abnormality involving the inferior wall and part of the apex. Moderate left ventricular dilatation. 3. Left ventricular ejection fraction 42% 4. Non invasive risk stratification*: High risk.  Patient Profile     83 y.o. female w/ hx  NICM, chronic systolic heart failure, and severe AS s/p TAVR 12/21/2012 w/ 29 mm Edwards Sapien XT valve. Hx atrial fibrillation, HTN, chronic anticoagulation w/ warfarin. s/p PPM by Dr. Ladona Ridgel.Other hx includes, dementia, chronic pain, lives at [facility]. Admitted 02/23 with Heart failure. MV instead of cath due to RI  Assessment & Plan    Acute respiratory failure Improved after Lasix 80 mg every 12 hours x 4 doses- held since 02/24.  -I/O net negative 5.8 L since admit - wt down to 178 from 185.   - will recheck CXR, may be mostly chronic changes now   Chest pain w/Non obstructive disease in 2014.  - Nuc study 03/04 w/ Lg scar, no ischemia.   - nuc instead of cath initially due to ARI - she is currently asymptomatic - coumadin is on hold, MD advise on cath  PAFmaintaining SR CHA2DS2VASc score of  8, - chronic coumadin INR 1.58 today Pharmacy following  - Coumadin on hold w/ last dose 03/04  Hx of NICM now returned with EF 35-40% +WMA -  nuc study initially, no cath, due to elevated Cr.   Hx of [id number]. see Echo  CKD 4  - Cr 1.85 on admit increased w/ diuresis - peak 2.72, now 2.63 -  lasix on hold  Otherwise, per IM Principal Problem:   Acute respiratory failure (HCC) Active Problems:  Paroxysmal atrial fibrillation (HCC)   Hypertension   Acute on chronic systolic and diastolic heart failure, NYHA class 3 (HCC)   Dementia without behavioral disturbance   CKD (chronic kidney disease) stage 4, GFR 15-29 ml/min (HCC)   Elevated blood sugar   Acute on chronic combined systolic and diastolic congestive heart failure (HCC)   Unstable angina (HCC)   Chest pain at rest  Melida Quitter , PA-C 8:49 AM 06/23/2016 Pager: [phone]  Patient seen, examined. Available data reviewed. Agree with findings, assessment, and plan as outlined by Theodore Demark, PA-C. The patient is independently interviewed and examined. She is known to me from previous TAVR in 2014. The patient has a long list of medical problems and she is a resident at [facility]. She is admitted with congestive heart failure. On exam, she is a delightful elderly woman in no distress. JVP mildly elevated, lung fields are clear, heart is regular rate and rhythm with no murmur or gallop, extremities are without edema.  I have reviewed the patient's history carefully. She does have a history of segmental LV dysfunction with moderate to severe reduction in LVEF back in 2014. She had some improvement in LV function into the low normal range with an LVEF of 50-55%. She now has recurrent congestive heart failure and reduced ejection fraction again. She had a cardiac catheterization in 2014 before TAVR and had minimal CAD. Considering her comorbid conditions, chronic kidney disease with GFR less than 30, and  history of minimal CAD, I think the risk/benefit for cardiac catheterization is unfavorable as the likelihood of finding coronary obstructive disease is very low. It is okay to start her back on her oral anticoagulant and continue to treat her medically. The fact that her troponin is undetectable with a creatinine of 2.56 mg/dL is another indicator that it is unlikely that she has coronary disease.  Tonny Bollman, M.D. 06/23/2016 3:44 PM

## 2016-06-23 NOTE — Progress Notes (Signed)
Physical Therapy Treatment Patient Details Name: [name] MRN: [id number] DOB: 05/22/1932 Today's Date: 06/23/2016    History of Present Illness [name] is a 83 y.o. female with PMHx: diastolic HF, CKD stage IV, aortic stenosis, dementia, chronic pain, HTN, paroxysmal A-fib, right patella tendon repair, remote lt patellar tendon repair. Admitted with  respiratory distress with xray revealing mild CHF.    PT Comments    Pt initially declined to ambulate, but did therex.  After therex, she was agreeable to ambulate with increased fatigue with flexed posture. o2 on RA 97-99%. Con't to recommend SNF.  Follow Up Recommendations  SNF     Equipment Recommendations  None recommended by PT    Recommendations for Other Services       Precautions / Restrictions Precautions Precautions: Fall Precaution Comments: Decreased use of right shoulder Restrictions Weight Bearing Restrictions: No    Mobility  Bed Mobility               General bed mobility comments: Pt sitting in recliner upon arrival  Transfers Overall transfer level: Needs assistance Equipment used: Rolling walker (2 wheeled) Transfers: Sit to/from Stand Sit to Stand: Min guard         General transfer comment: cues for hand placement and technique  Ambulation/Gait Ambulation/Gait assistance: Min assist Ambulation Distance (Feet): 50 Feet Assistive device: Rolling walker (2 wheeled) Gait Pattern/deviations: Decreased step length - right;Decreased step length - left;Trunk flexed Gait velocity: decreased   General Gait Details: Pt with progressive trunk flexion as she fatigued with gait.     Stairs            Wheelchair Mobility    Modified Rankin (Stroke Patients Only)       Balance           Standing balance support: Bilateral upper extremity supported Standing balance-Leahy Scale: Poor Standing balance comment: requires UE support                     Cognition Arousal/Alertness: Awake/alert Behavior During Therapy: WFL for tasks assessed/performed Overall Cognitive Status: Within Functional Limits for tasks assessed                      Exercises General Exercises - Lower Extremity Ankle Circles/Pumps: AROM;Both;10 reps;Seated Long Arc Quad: Strengthening;10 reps;Both;Seated Hip ABduction/ADduction: AROM;Both;10 reps;Seated Hip Flexion/Marching: Strengthening;Both;10 reps;Seated    General Comments        Pertinent Vitals/Pain Pain Assessment: Faces Faces Pain Scale: No hurt    Home Living                      Prior Function            PT Goals (current goals can now be found in the care plan section) Acute Rehab PT Goals Patient Stated Goal: to be able walk more PT Goal Formulation: With patient Time For Goal Achievement: 06/30/16 Potential to Achieve Goals: Good Progress towards PT goals: Progressing toward goals    Frequency    Min 3X/week      PT Plan Current plan remains appropriate    Co-evaluation             End of Session Equipment Utilized During Treatment: Gait belt Activity Tolerance: Patient tolerated treatment well Patient left: in chair;with call bell/phone within reach;with chair alarm set   PT Visit Diagnosis: Difficulty in walking, not elsewhere classified (R26.2)     Time: 1191-4782 PT  Time Calculation (min) (ACUTE ONLY): 16 min  Charges:  $Gait Training: 8-22 mins                    G Codes:       Anicka Stuckert LUBECK 06/23/2016, 1:40 PM

## 2016-06-23 NOTE — Progress Notes (Signed)
ANTICOAGULATION CONSULT NOTE - Follow Up Consult  Pharmacy Consult for Warfarin Indication: atrial fibrillation  Assessment: 83 yo F on PTA warfarin for history of afib. Warfarin was being held for cath but plan was changed, no plan for cath and warfarin resumed on 06/18/16.  INR = 1.58 today, subtherapeutic but trending up toward goal of 2-3.  Hgb is low but stable, platelets wnl, no overt bleeding noted.  Possible DDI: Continues on amiodarone as on both amiodarone and coumadin PTA   PTA dose: 1.5 mg daily  Goal of Therapy:  INR 2-3 Monitor platelets by anticoagulation protocol: Yes   Plan:  Warfarin 3mg  tonight x1 Daily INR Monitor s/sx of bleeding Continue sq heparin for dvt px   Allergies  Allergen Reactions  . Nubain [Nalbuphine Hcl] Hives    Went into cardiac arrest   . Codeine Hives and Nausea Only  . Darvocet [Propoxyphene N-Acetaminophen] Nausea Only    Patient Measurements: Height: 4\' 9"  (144.8 cm) Weight: 178 lb 14.4 oz (81.1 kg) (c scale) IBW/kg (Calculated) : 38.6  Vital Signs: Temp: 98.2 F (36.8 C) (03/05 0525) Temp Source: Oral (03/05 0525) BP: 123/61 (03/05 0525) Pulse Rate: 57 (03/05 0525)  Labs:  Recent Labs  06/21/16 0544 06/22/16 0627 06/22/16 1414 06/22/16 1811 06/23/16 0049 06/23/16 0843  HGB 10.6* 10.0*  --   --   --  11.0*  HCT 32.8* 30.0*  --   --   --  34.0*  PLT 195 183  --   --   --  213  LABPROT 14.5 17.8*  --   --  19.0*  --   INR 1.12 1.45  --   --  1.58  --   CREATININE 2.72* 2.63*  --   --   --  2.56*  TROPONINI  --   --  0.03* 0.03* <0.03  --     Estimated Creatinine Clearance: 14.9 mL/min (by C-G formula based on SCr of 2.56 mg/dL (H)).   Noah Delaine, RPh Clinical Pharmacist Pager: [phone]-4P [phone] 4P-10P [phone] Main Pharmacy [phone] 06/23/2016 9:51 AM

## 2016-06-24 LAB — CBC
HEMATOCRIT: 30.7 % — AB (ref 36.0–46.0)
Hemoglobin: 10.1 g/dL — ABNORMAL LOW (ref 12.0–15.0)
MCH: 30.9 pg (ref 26.0–34.0)
MCHC: 32.9 g/dL (ref 30.0–36.0)
MCV: 93.9 fL (ref 78.0–100.0)
Platelets: 178 10*3/uL (ref 150–400)
RBC: 3.27 MIL/uL — ABNORMAL LOW (ref 3.87–5.11)
RDW: 16.6 % — AB (ref 11.5–15.5)
WBC: 7.2 10*3/uL (ref 4.0–10.5)

## 2016-06-24 LAB — GLUCOSE, CAPILLARY
GLUCOSE-CAPILLARY: 89 mg/dL (ref 65–99)
GLUCOSE-CAPILLARY: 109 mg/dL — AB (ref 65–99)
Glucose-Capillary: 86 mg/dL (ref 65–99)
Glucose-Capillary: 109 mg/dL — ABNORMAL HIGH (ref 65–99)

## 2016-06-24 LAB — BASIC METABOLIC PANEL
ANION GAP: 9 (ref 5–15)
BUN: 56 mg/dL — ABNORMAL HIGH (ref 6–20)
CALCIUM: 9.7 mg/dL (ref 8.9–10.3)
CO2: 24 mmol/L (ref 22–32)
Chloride: 104 mmol/L (ref 101–111)
Creatinine, Ser: 2.35 mg/dL — ABNORMAL HIGH (ref 0.44–1.00)
GFR calc non Af Amer: 18 mL/min — ABNORMAL LOW (ref >60)
GFR, EST AFRICAN AMERICAN: 21 mL/min — AB (ref >60)
Glucose, Bld: 88 mg/dL (ref 65–99)
Potassium: 4.8 mmol/L (ref 3.5–5.1)
SODIUM: 137 mmol/L (ref 135–145)

## 2016-06-24 LAB — PROTIME-INR
INR: 1.71
Prothrombin Time: 20.3 seconds — ABNORMAL HIGH (ref 11.4–15.2)

## 2016-06-24 MED ORDER — WARFARIN SODIUM 2 MG PO TABS
2.0000 mg | ORAL_TABLET | Freq: Once | ORAL | Status: AC
  Administered 2016-06-24: 2 mg via ORAL
  Filled 2016-06-24: qty 1

## 2016-06-24 MED ORDER — HYDRALAZINE HCL 50 MG PO TABS
50.0000 mg | ORAL_TABLET | Freq: Three times a day (TID) | ORAL | Status: DC
  Administered 2016-06-24 – 2016-06-26 (×7): 50 mg via ORAL
  Filled 2016-06-24 (×6): qty 1

## 2016-06-24 MED ORDER — TORSEMIDE 20 MG PO TABS
60.0000 mg | ORAL_TABLET | Freq: Every day | ORAL | Status: DC
  Administered 2016-06-24 – 2016-06-25 (×2): 60 mg via ORAL
  Filled 2016-06-24 (×2): qty 3

## 2016-06-24 NOTE — Progress Notes (Signed)
Patient ID: [name], female   DOB: [birth year], 83 y.o.   MRN: [id number]  PROGRESS NOTE    [name]  WUJ:[id number] DOB: 02/06/1933 DOA: 06/13/2016  PCP: Oneal Grout, MD   Brief Narrative:  83 year old female with past medical history significant for chronic systolic and diastolic CHF, chronic kidney disease stage IV, aortic stenosis status post TAVR, history of dementia, paroxysmal atrial fibrillation on Coumadin patient lives in [facility] skilled nursing facility. She was brought 06/13/2016 to [facility] for evaluation of acute respiratory distress. Patient was found to have acute combined systolic and diastolic congestive heart failure. She was seen by cardiology in consultation, was diuresed with symptom improvement. She underwent stress test 06/16/2016 with findings of infarction involving inferior wall and part of the apex with no reversible defects to suggest ischemia.  Her hospital course was complicated with worsening creatinine while on torsemide 100 mg daily and torsemide was subsequently stopped. Creatinine eventually started to improve and cardio has resumed torsemide at 60 mg daily this am. We will have to continue to monitor renal function while on torsemide to make sure it remains stable.    Assessment & Plan:   Principal Problem: Acute on chronic systolic and diastolic heart failure, NYHA class 3 (HCC) - Echo on this admission showed ejection fraction 35-40% with inferior, inferior septal and anterior walls hypokinesis which is new compared to echo in 2016, grade 2 diastolic dysfunction - Please note, initially patient was on Lasix 80 mg every 12 hours but this was stopped because of renal insufficiency - She was then switched to torsemide 100 mg daily. Torsemide then placed on hold 3/3 due to worsening renal function  - Cardio started torsemide this am 3/6 at a lower dose 60 mg a day - Continue isosorbide 60 mg daily and increased hydralazine from 37.5 mg PO TID  to 50 mg PO Q 8 hours per cardio recommendations   Active Problems:   Paroxysmal atrial fibrillation (HCC) - CHADS vasc score 6 - Continue rate controlled with amiodarone 100 mg daily and Coreg 25 mg BID - Continue Coumadin  - INR 1.71 this am    Essential hypertension - Continue Coreg 25 mg BID, hydralazine 60 mg PO TID and isosorbide 60 mg daily     Dementia without behavioral disturbance - Continue Namenda 5 mg twice daily, Aricept 10 mg at bedtime, Cymbalta 60 mg at bedtime    Chest pain - No obstructive disease in 2014 - Stress test on this admission showed infarction involving the inferior wall and part of the apex with no reversible defects to suggest ischemia, ejection fraction 42%, high risk study - D dimer was negative      Hypothyroidism - Continue Synthroid 75 g daily    Dyslipidemia - Continue Lipitor 10 mg at bedtime    CKD (chronic kidney disease) stage 4, GFR 15-29 ml/min (HCC) - Baseline creatinine in September 2017 as high as 3.1 - Torsemide stopped 3/3 - Cr improving and cardio resumed torsemide at 60 mg a day - Follow up BMP in am    Anemia of chronic kidney disease  - Hemoglobin stable at 10, 11    Depression  - Continue Cymbalta. Lyrica    DVT prophylaxis: On anticoagulation with Coumadin Code Status: full code  Family Communication: family not at the bedside this am Disposition Plan: to SNF once cleared by cardio    Consultants:   Cardiology  Nephrology  Procedures:   Echo 06/13/2016 showed ejection fraction  35-40% with inferior hypokinesis, inferior septal and anterior walls hypokinesis which is new compared to echo in 2016; grade 2 diastolic dysfunction  Antimicrobials:   None   Subjective: No overnight events.   Objective: Vitals:   06/23/16 1536 06/23/16 1959 06/24/16 0446 06/24/16 0831  BP: (!) 118/50 (!) 154/56 (!) 139/53 (!) 135/58  Pulse:  84 62   Resp:  18 18   Temp:  98.5 F (36.9 C) 97.8 F (36.6 C)   TempSrc:   Oral Oral   SpO2:  97% 96%   Weight:   80.3 kg (177 lb 1.6 oz)   Height:        Intake/Output Summary (Last 24 hours) at 06/24/16 1210 Last data filed at 06/24/16 0800  Gross per 24 hour  Intake              420 ml  Output             1570 ml  Net            -1150 ml   Filed Weights   06/22/16 0324 06/23/16 0525 06/24/16 0446  Weight: 80.6 kg (177 lb 12.8 oz) 81.1 kg (178 lb 14.4 oz) 80.3 kg (177 lb 1.6 oz)    Examination:  General exam: calm and comfortable  Respiratory system: No wheezing, no rhonchi, bilateral air entry  Cardiovascular system: S1 & S2 heard, Rate controlled Gastrointestinal system: (+) BS, no distention, no tenderness  Central nervous system: No focal deficits  Extremities: Palpable pulses bilaterally, no tenderness  Skin: warm and dry Psychiatry: Normal mood, behavior   Data Reviewed: I have personally reviewed following labs and imaging studies  CBC:  Recent Labs Lab 06/20/16 0341 06/21/16 0544 06/22/16 0627 06/23/16 0843 06/24/16 0428  WBC 9.4 8.9 8.1 8.9 7.2  HGB 10.9* 10.6* 10.0* 11.0* 10.1*  HCT 33.5* 32.8* 30.0* 34.0* 30.7*  MCV 93.1 92.9 92.6 93.4 93.9  PLT 213 195 183 213 178   Basic Metabolic Panel:  Recent Labs Lab 06/20/16 0341 06/21/16 0544 06/22/16 0627 06/23/16 0843 06/24/16 0428  NA 137 141 136 137 137  K 3.7 4.3 4.3 4.8 4.8  CL 100* 101 101 103 104  CO2 28 29 28 27 24   GLUCOSE 79 92 83 112* 88  BUN 63* 73* 70* 62* 56*  CREATININE 2.42* 2.72* 2.63* 2.56* 2.35*  CALCIUM 8.9 9.3 8.8* 9.3 9.7   GFR: Estimated Creatinine Clearance: 16.1 mL/min (by C-G formula based on SCr of 2.35 mg/dL (H)). Liver Function Tests: No results for input(s): AST, ALT, ALKPHOS, BILITOT, PROT, ALBUMIN in the last 168 hours. No results for input(s): LIPASE, AMYLASE in the last 168 hours. No results for input(s): AMMONIA in the last 168 hours. Coagulation Profile:  Recent Labs Lab 06/20/16 0341 06/21/16 0544 06/22/16 0627  06/23/16 0049 06/24/16 0428  INR 1.08 1.12 1.45 1.58 1.71   Cardiac Enzymes:  Recent Labs Lab 06/22/16 1414 06/22/16 1811 06/23/16 0049  TROPONINI 0.03* 0.03* <0.03   BNP (last 3 results) No results for input(s): PROBNP in the last 8760 hours. HbA1C: No results for input(s): HGBA1C in the last 72 hours. CBG:  Recent Labs Lab 06/23/16 0750 06/23/16 1141 06/23/16 1640 06/23/16 2132 06/24/16 0742  GLUCAP 99 106* 110* 129* 86   Lipid Profile: No results for input(s): CHOL, HDL, LDLCALC, TRIG, CHOLHDL, LDLDIRECT in the last 72 hours. Thyroid Function Tests: No results for input(s): TSH, T4TOTAL, FREET4, T3FREE, THYROIDAB in the last 72 hours. Anemia Panel: No  results for input(s): VITAMINB12, FOLATE, FERRITIN, TIBC, IRON, RETICCTPCT in the last 72 hours. Urine analysis:    Component Value Date/Time   COLORURINE YELLOW 06/19/2015 1339   APPEARANCEUR CLEAR 06/19/2015 1339   LABSPEC 1.007 06/19/2015 1339   PHURINE 6.0 06/19/2015 1339   GLUCOSEU NEGATIVE 06/19/2015 1339   HGBUR NEGATIVE 06/19/2015 1339   BILIRUBINUR NEGATIVE 06/19/2015 1339   KETONESUR NEGATIVE 06/19/2015 1339   PROTEINUR NEGATIVE 06/19/2015 1339   UROBILINOGEN 0.2 11/29/2014 1411   NITRITE NEGATIVE 06/19/2015 1339   LEUKOCYTESUR NEGATIVE 06/19/2015 1339   Sepsis Labs: @LABRCNTIP (procalcitonin:4,lacticidven:4)   Recent Results (from the past 240 hour(s))  MRSA PCR Screening     Status: Abnormal   Collection Time: 06/13/16 12:21 PM  Result Value Ref Range Status   MRSA by PCR POSITIVE (A) NEGATIVE Final      Radiology Studies: Nm Myocar Multi W/spect W/wall Motion / Ef Result Date: 06/16/2016 1. Infarction/scar involving the inferior wall and part of the apex. No reversible defects to suggest ischemia. 2. Wall motion abnormality involving the inferior wall and part of the apex. Moderate left ventricular dilatation. 3. Left ventricular ejection fraction 42% 4. Non invasive risk stratification*:  High risk. *2012 Appropriate Use Criteria for Coronary Revascularization Focused Update: J Am Coll Cardiol. 2012;59(9):857-881. http://content.dementiazones.com.aspx?articleid=[email or web address] Electronically Signed   By: Rudie Meyer M.D.   On: 06/16/2016 09:20   Dg Chest Port 1 View Result Date: 06/15/2016 Markedly improved pulmonary edema. Cardiomegaly. Atherosclerosis.     Scheduled Meds: . amiodarone  100 mg Oral Daily  . atorvastatin  10 mg Oral q1800  . calcitRIOL  0.25 mcg Oral q morning - 10a  . carvedilol  25 mg Oral BID  . cycloSPORINE  1 drop Both Eyes BID  . donepezil  10 mg Oral QHS  . DULoxetine  60 mg Oral QPM  . febuxostat  40 mg Oral QPM  . heparin  5,000 Units Subcutaneous Q8H  . hydrALAZINE  50 mg Oral Q8H  . insulin aspart  0-15 Units Subcutaneous TID WC  . insulin aspart  0-5 Units Subcutaneous QHS  . isosorbide mononitrate  60 mg Oral Daily  . levothyroxine  75 mcg Oral QAC breakfast  . linaclotide  145 mcg Oral Daily  . loratadine  10 mg Oral Daily  . mouth rinse  15 mL Mouth Rinse BID  . memantine  5 mg Oral BID  . MUSCLE RUB   Topical BID  . pantoprazole  40 mg Oral Daily  . pregabalin  50 mg Oral 3 times per day  . senna  2 tablet Oral BID  . sodium chloride flush  3 mL Intravenous Q12H  . torsemide  60 mg Oral Daily  . warfarin  2 mg Oral ONCE-1800  . Warfarin - Pharmacist Dosing Inpatient   Does not apply q1800   Continuous Infusions:   LOS: 11 days    Time spent: 25 minutes  Greater than 50% of the time spent on counseling and coordinating the care.   Manson Passey, MD [facility] Pager [phone]  If 7PM-7AM, please contact night-coverage [email or web address] Password [email or web address] 06/24/2016, 12:10 PM

## 2016-06-24 NOTE — Progress Notes (Signed)
Progress Note  Patient Name: [name] Date of Encounter: 06/24/2016  Primary Cardiologist: Dr Excell Seltzer 01/2014 EP: Dr Ladona Ridgel  Patient Profile     83 y.o. female w/ hx NICM, chronic systolic heart failure, and severe AS s/p TAVR 12/21/2012 w/ 29 mm Edwards Sapien XT valve. Hx atrial fibrillation, HTN, chronic anticoagulation w/ warfarin. s/p PPM by Dr. Ladona Ridgel.Other hx includes, dementia, chronic pain, lives at [facility]. Admitted 02/23 with Heart failure.  Subjective   No chest pain, activity level is poor at baseline, uses a wheelchair with minimal ambulation Breathing better than on admit, not sure she is back to baseline. Coughing and wheezing more today.   Inpatient Medications    Scheduled Meds: . amiodarone  100 mg Oral Daily  . atorvastatin  10 mg Oral q1800  . calcitRIOL  0.25 mcg Oral q morning - 10a  . carvedilol  25 mg Oral BID  . cycloSPORINE  1 drop Both Eyes BID  . donepezil  10 mg Oral QHS  . DULoxetine  60 mg Oral QPM  . febuxostat  40 mg Oral QPM  . heparin  5,000 Units Subcutaneous Q8H  . hydrALAZINE  37.5 mg Oral TID  . insulin aspart  0-15 Units Subcutaneous TID WC  . insulin aspart  0-5 Units Subcutaneous QHS  . isosorbide mononitrate  60 mg Oral Daily  . levothyroxine  75 mcg Oral QAC breakfast  . linaclotide  145 mcg Oral Daily  . loratadine  10 mg Oral Daily  . mouth rinse  15 mL Mouth Rinse BID  . memantine  5 mg Oral BID  . MUSCLE RUB   Topical BID  . pantoprazole  40 mg Oral Daily  . pregabalin  50 mg Oral 3 times per day  . senna  2 tablet Oral BID  . sodium chloride flush  3 mL Intravenous Q12H  . Warfarin - Pharmacist Dosing Inpatient   Does not apply q1800   Continuous Infusions:  PRN Meds: sodium chloride, acetaminophen, bisacodyl, HYDROcodone-acetaminophen, ipratropium-albuterol, ondansetron (ZOFRAN) IV, simethicone, sodium chloride flush   Vital Signs    Vitals:   06/23/16 1219 06/23/16 1536 06/23/16 1959 06/24/16 0446    BP: 110/66 (!) 118/50 (!) 154/56 (!) 139/53  Pulse: 83  84 62  Resp: 20  18 18   Temp: 98.2 F (36.8 C)  98.5 F (36.9 C) 97.8 F (36.6 C)  TempSrc: Oral  Oral Oral  SpO2: 94%  97% 96%  Weight:    177 lb 1.6 oz (80.3 kg)  Height:        Intake/Output Summary (Last 24 hours) at 06/24/16 0750 Last data filed at 06/24/16 0744  Gross per 24 hour  Intake              420 ml  Output             1890 ml  Net            -1470 ml   Filed Weights   06/22/16 0324 06/23/16 0525 06/24/16 0446  Weight: 177 lb 12.8 oz (80.6 kg) 178 lb 14.4 oz (81.1 kg) 177 lb 1.6 oz (80.3 kg)    Telemetry    A pacing, AV pacing, occ PVCs - Personally Reviewed  ECG    n/a - Personally Reviewed  Physical Exam   General: Well developed, well nourished, female appearing in no acute distress. Head: Normocephalic, atraumatic.  Neck: Supple without bruits, JVD 8 cm. Lungs:  Resp regular and  unlabored, bilateral rales. Coarser on the L Heart: RRR, S1, S2, no S3, S4, 2/6 murmur; no rub. Abdomen: Soft, non-tender, non-distended with normoactive bowel sounds. No hepatomegaly. No rebound/guarding. No obvious abdominal masses. Extremities: No clubbing, cyanosis, no edema. Distal pedal pulses are 2+ bilaterally. Neuro: Alert and oriented X 3. Moves all extremities spontaneously. Psych: Normal affect.  Labs    Hematology  Recent Labs Lab 06/22/16 [phone] 06/23/16 0843 06/24/16 0428  WBC 8.1 8.9 7.2  RBC 3.24* 3.64* 3.27*  HGB 10.0* 11.0* 10.1*  HCT 30.0* 34.0* 30.7*  MCV 92.6 93.4 93.9  MCH 30.9 30.2 30.9  MCHC 33.3 32.4 32.9  RDW 16.1* 16.2* 16.6*  PLT 183 213 178    Chemistry  Recent Labs Lab 06/22/16 0627 06/23/16 0843 06/24/16 0428  NA 136 137 137  K 4.3 4.8 4.8  CL 101 103 104  CO2 28 27 24   GLUCOSE 83 112* 88  BUN 70* 62* 56*  CREATININE 2.63* 2.56* 2.35*  CALCIUM 8.8* 9.3 9.7  GFRNONAA 16* 16* 18*  GFRAA 18* 19* 21*  ANIONGAP 7 7 9      Cardiac Enzymes  Recent Labs Lab  06/22/16 1414 06/22/16 1811 06/23/16 0049  TROPONINI 0.03* 0.03* <0.03   Lab Results  Component Value Date   INR 1.71 06/24/2016   INR 1.58 06/23/2016   INR 1.45 06/22/2016   BNP    Component Value Date/Time   BNP 528.3 (H) 06/13/2016 0530   DDimer   Recent Labs Lab 06/22/16 1414  DDIMER 0.36     Radiology   Dg Chest 2 View Result Date: 06/23/2016 CLINICAL DATA:  CHF, shortness of breath EXAM: CHEST  2 VIEW COMPARISON:  06/15/2016 FINDINGS: Right-sided duo lead pacemaker similar compared to prior. Valvular prosthesis. Mild increased atelectasis or infiltrate at the left lung base. Suspect tiny left pleural effusion. Stable enlarged cardiomediastinal without overt failure. Atherosclerosis. No pneumothorax. IMPRESSION: 1. Increasing atelectasis or infiltrate at the left base with possible tiny left effusion 2. Stable cardiomegaly Electronically Signed   By: Jasmine Pang M.D.   On: 06/23/2016 14:23   Nm Myocar Multi W/spect W/wall Motion / Ef Result Date: 06/16/2016 CLINICAL DATA:  Chest pain. EXAM: MYOCARDIAL IMAGING WITH SPECT (REST AND PHARMACOLOGIC-STRESS) GATED LEFT VENTRICULAR WALL MOTION STUDY LEFT VENTRICULAR EJECTION FRACTION TECHNIQUE: Standard myocardial SPECT imaging was performed after resting intravenous injection of 10 mCi Tc-100m tetrofosmin. Subsequently, intravenous infusion of Lexiscan was performed under the supervision of the Cardiology staff. At peak effect of the drug, 30 mCi Tc-81m tetrofosmin was injected intravenously and standard myocardial SPECT imaging was performed. Quantitative gated imaging was also performed to evaluate left ventricular wall motion, and estimate left ventricular ejection fraction. COMPARISON:  None. FINDINGS: Perfusion: Moderate to large fixed defect involving the inferior wall and part of the apex consistent with scar. No reversible defects to suggest ischemia. Wall Motion: Wall motion abnormality involving the inferior wall and apex.  Moderate left ventricular dilatation. Left Ventricular Ejection Fraction: 42 % End diastolic volume 122 ml End systolic volume 70 ml IMPRESSION: 1. Infarction/scar involving the inferior wall and part of the apex. No reversible defects to suggest ischemia. 2. Wall motion abnormality involving the inferior wall and part of the apex. Moderate left ventricular dilatation. 3. Left ventricular ejection fraction 42% 4. Non invasive risk stratification*: High risk. *2012 Appropriate Use Criteria for Coronary Revascularization Focused Update: J Am Coll Cardiol. 2012;59(9):857-881. http://content.dementiazones.com.aspx?articleid=[email or web address] Electronically Signed   By: Rudie Meyer M.D.   On: 06/16/2016 09:20  Dg Chest Port 1 View Result Date: 06/15/2016 CLINICAL DATA:  Patient admitted with respiratory distress 06/13/2016. EXAM: PORTABLE CHEST 1 VIEW COMPARISON:  Single-view of the chest 06/13/2016. CT chest 12/14/2014. PA and lateral chest 11/29/2014. FINDINGS: There is cardiomegaly. The patient is status post aortic valve repair. Pacing device is in place. Atherosclerosis is noted. Pulmonary edema seen on the most recent examination is markedly improved. No pneumothorax or pleural effusion. IMPRESSION: Markedly improved pulmonary edema. Cardiomegaly. Atherosclerosis. Electronically Signed   By: Drusilla Kanner M.D.   On: 06/15/2016 14:15   Dg Chest Port 1 View Result Date: 06/13/2016 CLINICAL DATA:  83 year old female with shortness of breath. EXAM: PORTABLE CHEST 1 VIEW COMPARISON:  Chest CT dated 12/14/2014 under radiograph dated 11/29/2014 FINDINGS: There is stable cardiomegaly. An aortic valve stent is noted. Central vascular prominence and diffuse interstitial densities noted likely mild vascular congestion and edema. Bilateral lower lung field hazy densities may represent atelectasis versus infiltrate. There is blunting of the left costophrenic angle which may represent a small pleural effusion. There is no  pneumothorax. Right pectoral pacemaker device noted. No acute osseous pathology. IMPRESSION: 1. Cardiomegaly with probable mild congestive changes and interstitial edema. 2. Bibasilar atelectasis versus infiltrate. 3. Probable small left pleural effusion. Electronically Signed   By: Elgie Collard M.D.   On: 06/13/2016 05:18     Cardiac Studies   ECHO: 06/13/2016 - Left ventricle: LVEF is approximately 35 to 40% with hypokinesis   of inferior, inferoseptal and anterior walls This is new compared   to echo of 2016. The cavity size was moderately dilated. Wall   thickness was normal. Features are consistent with a pseudonormal   left ventricular filling pattern, with concomitant abnormal   relaxation and increased filling pressure (grade 2 diastolic   dysfunction). Doppler parameters are consistent with high   ventricular filling pressure. - Aortic valve: AV prosthesis is difficult to see Peak and mean   gradients through the valve are 12 and 7 mm Hg respectively.   There is trace valvular AI Valve area (VTI): 1.82 cm^2. Valve   area (Vmax): 1.7 cm^2. Valve area (Vmean): 1.89 cm^2. - Mitral valve: There was mild to moderate regurgitation. - Left atrium: The atrium was mildly dilated. - Pulmonary arteries: PA peak pressure: 39 mm Hg (S).  Myoview: 06/22/2016 IMPRESSION: 1. Infarction/scar involving the inferior wall and part of the apex. No reversible defects to suggest ischemia. 2. Wall motion abnormality involving the inferior wall and part of the apex. Moderate left ventricular dilatation. 3. Left ventricular ejection fraction 42% 4. Non invasive risk stratification*: High risk.  Patient Profile     83 y.o. female w/ hx  NICM, chronic systolic heart failure, and severe AS s/p TAVR 12/21/2012 w/ 29 mm Edwards Sapien XT valve. Hx atrial fibrillation, HTN, chronic anticoagulation w/ warfarin. s/p PPM by Dr. Ladona Ridgel.Other hx includes, dementia, chronic pain, lives at [facility].  Admitted 02/23 with Heart failure. MV instead of cath due to RI  Assessment & Plan    Acute respiratory failure Improved after Lasix 80 mg every 12 hours x 4 doses- held since 02/24.  -I/O net negative 7.4 L since admit - wt down to 177 from 185.   - CXR rechecked, worsening infiltrate - management of infiltrate per IM  Chest pain w/Non obstructive disease in 2014.  - Nuc study 03/04 w/ Lg scar, no ischemia.   - nuc instead of cath initially due to ARI - she is currently asymptomatic - coumadin  was on hold but restarted since no cath planned  PAFmaintaining SR CHA2DS2VASc score of 8, - chronic coumadin INR 1.71 today Pharmacy following  - Coumadin restarted 03/05  Hx of NICM now returned with EF 35-40% +WMA -  nuc study results above, no cath, due to elevated Cr and other comorbidities.  - Ez neg and prev cath no sig dz, LVD again felt 2nd NICM  Hx of [id number]. see Echo  CKD 4  - Cr 1.85 on admit increased w/ diuresis - peak 2.72, now 2.35 -  lasix on hold  HTN - Hydralazine 37.5 mg tid, will change to 50 mg q 8 hr for better BP control - continue Coreg 25 mg bid and Imdur 60 mg qd  Otherwise, per IM Principal Problem:   Acute respiratory failure (HCC) Active Problems:   Paroxysmal atrial fibrillation (HCC)   Hypertension   Acute on chronic systolic and diastolic heart failure, NYHA class 3 (HCC)   Dementia without behavioral disturbance   CKD (chronic kidney disease) stage 4, GFR 15-29 ml/min (HCC)   Elevated blood sugar   Acute on chronic combined systolic and diastolic congestive heart failure (HCC)   Unstable angina (HCC)   Chest pain at rest  Signed, Theodore Demark , PA-C 7:50 AM 06/24/2016 Pager: [phone]  Patient seen, examined. Available data reviewed. Agree with findings, assessment, and plan as outlined by Theodore Demark, PA-C. The patient is independently interviewed and examined. She is sitting up at the chair at her bedside. She is  alert and oriented, in no distress. Venous pressure is normal. Lungs are diminished in both bases left worse than right but otherwise there is good air movement. Heart is RRR without murmur or gallop, abdomen is soft and nontender, there is no pretibial edema bilaterally.  The patient appears euvolemic on exam. She was taking torsemide 100 mg daily as an outpatient. She probably should be started back on an oral diuretic or she will likely develop symptoms related to heart failure again. Will start her at a lower dose of torsemide 60 mg daily. Her INR is trending up on warfarin. She otherwise is quite stable from a cardiac perspective at this time and no other medicine changes are recommended. See note above for details regarding titration of her antihypertensive regimen.  Tonny Bollman, M.D. 06/24/2016 11:26 AM

## 2016-06-24 NOTE — Progress Notes (Signed)
ANTICOAGULATION CONSULT NOTE - Follow Up Consult  Pharmacy Consult for Warfarin Indication: atrial fibrillation  Assessment: 83 yo F on PTA warfarin for history of PAF.  INR was 1.68 SUBtherapeutic on admission 2/23 with last dose prior to admission taken on 06/11/16.  Warfarin was being held for cath but plan was changed, no plan for cath and warfarin resumed on 06/18/16.  Today INR increased to 1.7, still subtherapeutic but trending up toward goal of 2-3.  Hgb is low but stable, platelets wnl at 178k. No overt bleeding noted.  Continues on amiodarone which interacts with coumadin to increase effect but pt was on both amiodarone and coumadin PTA thus no new DDI.  Currently on SQ heparin bridge.    PTA dose: 1.5 mg daily,  Last taken PTA on 06/11/16 , admitted 2/23, INR was 1.68,  SUBtherapeutic on admission.   Goal of Therapy:  INR 2-3 Monitor platelets by anticoagulation protocol: Yes   Plan:  Warfarin 2 mg tonight x1 Daily INR Monitor s/sx of bleeding Continue sq heparin for dvt px   Allergies  Allergen Reactions  . Nubain [Nalbuphine Hcl] Hives    Went into cardiac arrest   . Codeine Hives and Nausea Only  . Darvocet [Propoxyphene N-Acetaminophen] Nausea Only    Patient Measurements: Height: 4\' 9"  (144.8 cm) Weight: 177 lb 1.6 oz (80.3 kg) IBW/kg (Calculated) : 38.6  Vital Signs: Temp: 97.8 F (36.6 C) (03/06 0446) Temp Source: Oral (03/06 0446) BP: 135/58 (03/06 0831) Pulse Rate: 62 (03/06 0446)  Labs:  Recent Labs  06/22/16 0627 06/22/16 1414 06/22/16 1811 06/23/16 0049 06/23/16 0843 06/24/16 0428  HGB 10.0*  --   --   --  11.0* 10.1*  HCT 30.0*  --   --   --  34.0* 30.7*  PLT 183  --   --   --  213 178  LABPROT 17.8*  --   --  19.0*  --  20.3*  INR 1.45  --   --  1.58  --  1.71  CREATININE 2.63*  --   --   --  2.56* 2.35*  TROPONINI  --  0.03* 0.03* <0.03  --   --     Estimated Creatinine Clearance: 16.1 mL/min (by C-G formula based on SCr of 2.35  mg/dL (H)).  Thank you for allowing pharmacy to be part of this patients care team. Noah Delaine, RPh Clinical Pharmacist Pager: [phone] 8A-4P [phone] 4P-10P [phone] Main Pharmacy [phone] 06/24/2016 11:16 AM

## 2016-06-24 NOTE — Clinical Social Work Note (Signed)
CSW continues to follow for discharge needs.  Chas Axel, CSW [phone]  

## 2016-06-25 ENCOUNTER — Inpatient Hospital Stay (HOSPITAL_COMMUNITY): Payer: Medicare Other

## 2016-06-25 LAB — GLUCOSE, CAPILLARY
GLUCOSE-CAPILLARY: 106 mg/dL — AB (ref 65–99)
GLUCOSE-CAPILLARY: 96 mg/dL (ref 65–99)
Glucose-Capillary: 93 mg/dL (ref 65–99)
Glucose-Capillary: 102 mg/dL — ABNORMAL HIGH (ref 65–99)

## 2016-06-25 LAB — BASIC METABOLIC PANEL
ANION GAP: 7 (ref 5–15)
BUN: 52 mg/dL — ABNORMAL HIGH (ref 6–20)
CHLORIDE: 102 mmol/L (ref 101–111)
CO2: 30 mmol/L (ref 22–32)
Calcium: 10 mg/dL (ref 8.9–10.3)
Creatinine, Ser: 2.64 mg/dL — ABNORMAL HIGH (ref 0.44–1.00)
GFR calc non Af Amer: 16 mL/min — ABNORMAL LOW (ref >60)
GFR, EST AFRICAN AMERICAN: 18 mL/min — AB (ref >60)
GLUCOSE: 92 mg/dL (ref 65–99)
POTASSIUM: 4.1 mmol/L (ref 3.5–5.1)
Sodium: 139 mmol/L (ref 135–145)

## 2016-06-25 LAB — PROTIME-INR
INR: 1.7
PROTHROMBIN TIME: 20.2 s — AB (ref 11.4–15.2)

## 2016-06-25 MED ORDER — CARVEDILOL 12.5 MG PO TABS: 25.0000 mg | ORAL_TABLET | Freq: Two times a day (BID) | ORAL | Status: DC

## 2016-06-25 MED ORDER — WARFARIN SODIUM 2 MG PO TABS
4.0000 mg | ORAL_TABLET | Freq: Once | ORAL | Status: AC
  Administered 2016-06-25: 4 mg via ORAL
  Filled 2016-06-25: qty 2

## 2016-06-25 MED ORDER — PREGABALIN 25 MG PO CAPS
50.0000 mg | ORAL_CAPSULE | Freq: Every day | ORAL | Status: DC
  Administered 2016-06-25 – 2016-06-26 (×2): 50 mg via ORAL
  Filled 2016-06-25 (×2): qty 2

## 2016-06-25 MED ORDER — TORSEMIDE 20 MG PO TABS: 60.0000 mg | ORAL_TABLET | Freq: Every day | ORAL | Status: DC

## 2016-06-25 MED ORDER — ISOSORBIDE MONONITRATE ER 60 MG PO TB24: 60.0000 mg | ORAL_TABLET | Freq: Every day | ORAL | Status: DC

## 2016-06-25 MED ORDER — PREGABALIN 50 MG PO CAPS
50.0000 mg | ORAL_CAPSULE | Freq: Every day | ORAL | Status: DC
Start: 2016-06-25 — End: 2016-06-27

## 2016-06-25 NOTE — Progress Notes (Signed)
Pt returned back to bed from chair and toileted. No other concerns at this time.

## 2016-06-25 NOTE — Discharge Summary (Addendum)
Physician Discharge Summary  [name] ZOX:[id number] DOB: 13-Jul-1932 DOA: 06/13/2016  PCP: Oneal Grout, MD  Admit date: 06/13/2016 Discharge date: 06/27/2016  Time spent: 45 minutes  ADDENDUM: PATIENT WAS NOT DISCHARGED 3/7 and ended up discharging on 3/9 instead  Recommendations for Outpatient Follow-up:  1. PCP Dr.Pandey in 1 week, please check Bmet in 3days and Monitor INR, also needs ongoing Goals of Care discussions 2. Cards Dr.Cooper in 2weeks   Discharge Diagnoses:  Principal Problem:   Acute respiratory failure (HCC)   Acute on chronic systolic and diastolic heart failure, NYHA class 3 (HCC)   Paroxysmal atrial fibrillation (HCC)   Severe AS s/p TAVR   Hypertension   Dementia without behavioral disturbance   CKD (chronic kidney disease) stage 4, GFR 15-29 ml/min (HCC)   Elevated blood sugar   Acute on chronic combined systolic and diastolic congestive heart failure (HCC)   Unstable angina (HCC)   Chest pain at rest   Discharge Condition: stable  Diet recommendation: Low sodium, heart healthy  Filed Weights   06/23/16 0525 06/24/16 0446 06/25/16 0442  Weight: 81.1 kg (178 lb 14.4 oz) 80.3 kg (177 lb 1.6 oz) 79 kg (174 lb 1.6 oz)    History of present illness:  83 y.o. female w/ hx NICM, chronic systolic heart failure, and severe AS s/p TAVR 12/21/2012 w/ 29 mm Edwards Sapien XT valve. Hx atrial fibrillation, HTN, chronic anticoagulation w/ warfarin. s/p PPM by Dr. Ladona Ridgel.Other hx includes, dementia, chronic pain, lives at [facility]. Admitted 02/23 with Heart failure.  Hospital Course:  Acute on chronic systolic and diastolic heart failure, NYHA class 3 (HCC) - Echo on this admission showed ejection fraction 35-40% with inferior, inferior septal and anterior walls hypokinesis which is new compared to echo in 2016, grade 2 diastolic dysfunction, Myoview showed large scar and no inducible ischemia, no cath due to CKD -improved with diuresis, medical mgt -  Cards consulted, diuresed with IV lasix, negative 8.6L, then switched to torsemide 100 mg daily. Torsemide then placed on hold 3/3 due to worsening renal function  - Torsemide restarted at 60mg  daily, will need labs in 3days to FU Bmet -needs Close FU with Dr.Cooper    Paroxysmal atrial fibrillation (HCC) - CHADS vasc score 6 - Continue rate controlled with amiodarone 100 mg daily and Coreg 25 mg BID - restarted Coumadin , INR 1.71 this am, goal 2-3    Essential hypertension - Continue Coreg 25 mg BID, hydralazine increased to 50mg  and then cut back down to 25mg  TID and isosorbide 60 mg daily     Dementia without behavioral disturbance - Continue Namenda 5 mg twice daily, Aricept 10 mg at bedtime, Cymbalta 60 mg at bedtime    Chest pain - No obstructive disease on cath in 2014 - Stress test on this admission showed infarction involving the inferior wall and part of the apex with no reversible defects to suggest ischemia, ejection fraction 42%, high risk study - seen by Cath, no cath due to CKD    Hx of [id number]. see Echo - Stable based on recent ECHO    Hypothyroidism - Continue Synthroid 75 g daily    Dyslipidemia - Continue Lipitor 10 mg at bedtime    CKD (chronic kidney disease) stage 4, GFR 15-29 ml/min (HCC) - Baseline creatinine in September 2017 as high as 3.1 - Torsemide stopped 3/3 - Cr improving and cardio resumed torsemide at 60 mg a day, creatinine 2.6 now     Anemia of  chronic kidney disease  - Hemoglobin stable at 10    Depression  - Continue Cymbalta. Lyrica     Consultations:  Cards Dr.Cooper  Discharge Exam: Vitals:   06/25/16 0442 06/25/16 0900  BP: (!) 132/47 (!) 119/47  Pulse: 84 67  Resp: 20 18  Temp: 98.2 F (36.8 C) 97.7 F (36.5 C)    General: AAOx2, frail Cardiovascular: S1S2/Irregular Respiratory: diminished BS at bases  Discharge Instructions   Discharge Instructions    Diet - low sodium heart healthy    Complete  by:  As directed    Increase activity slowly    Complete by:  As directed      Current Discharge Medication List    START taking these medications   Details  isosorbide mononitrate (IMDUR) 60 MG 24 hr tablet Take 1 tablet (60 mg total) by mouth daily.      CONTINUE these medications which have CHANGED   Details  carvedilol (COREG) 12.5 MG tablet Take 2 tablets (25 mg total) by mouth 2 (two) times daily.   Associated Diagnoses: Essential hypertension    pregabalin (LYRICA) 50 MG capsule Take 1 capsule (50 mg total) by mouth at bedtime. 6AM, 2PM, 10PM    torsemide (DEMADEX) 20 MG tablet Take 3 tablets (60 mg total) by mouth daily.   Associated Diagnoses: Chronic diastolic heart failure (HCC)      CONTINUE these medications which have NOT CHANGED   Details  alendronate (FOSAMAX) 70 MG/75ML solution Take 70 mg by mouth every 7 (seven) days. Take with a full glass of water on an empty stomach on Saturday    amiodarone (PACERONE) 100 MG tablet Take 100 mg by mouth daily.    atorvastatin (LIPITOR) 10 MG tablet Take 10 mg by mouth daily at 6 PM.     BIOTIN 5000 PO Take 5,000 mcg by mouth every evening.     bisacodyl (DULCOLAX) 10 MG suppository Place 10 mg rectally daily as needed for moderate constipation.    calcitRIOL (ROCALTROL) 0.25 MCG capsule Take 0.25 mcg by mouth every morning.     cetirizine (ZYRTEC) 10 MG tablet Take 10 mg by mouth daily.     Cholecalciferol (VITAMIN D-3) 1000 units CAPS Take 2 capsules by mouth. Take 2 capsules to = 2000 units PO QHS    cycloSPORINE (RESTASIS) 0.05 % ophthalmic emulsion Place 1 drop into both eyes 2 (two) times daily.     donepezil (ARICEPT) 10 MG tablet Take 10 mg by mouth at bedtime.     DULoxetine (CYMBALTA) 60 MG capsule Take 60 mg by mouth every evening.     esomeprazole (NEXIUM) 40 MG capsule Take 40 mg by mouth daily.     ferrous sulfate 325 (65 FE) MG tablet Take 325 mg by mouth daily.     hydrALAZINE (APRESOLINE) 25 MG  tablet Take 25 mg by mouth 3 (three) times daily.    HYDROcodone-acetaminophen (NORCO) 7.5-325 MG tablet Take 1-2 tablets by mouth every 4 (four) hours as needed for moderate pain.    ipratropium-albuterol (DUONEB) 0.5-2.5 (3) MG/3ML SOLN Take 3 mLs by nebulization every 6 (six) hours as needed (wheezing/shortness of breath).    levothyroxine (SYNTHROID, LEVOTHROID) 112 MCG tablet Take 112 mcg by mouth daily.     Linaclotide (LINZESS) 145 MCG CAPS capsule Take 145 mcg by mouth daily.     memantine (NAMENDA) 5 MG tablet Take 5 mg by mouth 2 (two) times daily.     Menthol (ICY HOT ADVANCED RELIEF)  7.5 % PTCH Apply 3 patches topically daily. Apply 1 patch to each knee and one patch across lower back.  Apply at 10AM and remove at 10PM    Menthol, Topical Analgesic, (BIOFREEZE) 4 % GEL Apply 1 application topically 2 (two) times daily. Apply to back of neck, lower back, and right leg    Multiple Vitamin (MULTIVITAMIN) tablet Take 1 tablet by mouth daily.     nitroGLYCERIN (NITROSTAT) 0.4 MG SL tablet Place 0.4 mg under the tongue every 5 (five) minutes as needed for chest pain (MAX 3 TABLETS).     polyethylene glycol (MIRALAX / GLYCOLAX) packet Take 17 g by mouth 2 (two) times daily. Qty: 14 each, Refills: 0    pramoxine (SARNA SENSITIVE) 1 % LOTN Apply 1 application topically daily as needed (itching).     senna (SENOKOT) 8.6 MG TABS tablet Take 2 tablets by mouth 2 (two) times daily.    simethicone (MYLICON) 80 MG chewable tablet Chew 80 mg by mouth every 12 (twelve) hours as needed for flatulence.     Turmeric 500 MG CAPS Take 1 capsule by mouth 2 (two) times daily.    ULORIC 40 MG tablet Take 40 mg by mouth every evening.     vitamin B-12 (CYANOCOBALAMIN) 1000 MCG tablet Take 1,000 mcg by mouth 2 (two) times a week. Tuesday and Friday    warfarin (COUMADIN) 3 MG tablet Take 1.5 mg by mouth daily. Take 1/2 of a 3 mg tablet to = 1.5 mg qd       Allergies  Allergen Reactions  .  Nubain [Nalbuphine Hcl] Hives    Went into cardiac arrest   . Codeine Hives and Nausea Only  . Darvocet [Propoxyphene N-Acetaminophen] Nausea Only   Follow-up Information    PANDEY, MAHIMA, MD. Schedule an appointment as soon as possible for a visit in 1 week(s).   Specialty:  Internal Medicine Contact information: [address] [place] [address] [phone]        Tonny Bollman, MD. Schedule an appointment as soon as possible for a visit in 2 week(s).   Specialty:  Cardiology Contact information: [address] [place] [address] [phone]            The results of significant diagnostics from this hospitalization (including imaging, microbiology, ancillary and laboratory) are listed below for reference.    Significant Diagnostic Studies: Dg Chest 2 View  Result Date: 06/25/2016 CLINICAL DATA:  CHF EXAM: CHEST  2 VIEW COMPARISON:  06/23/2016 FINDINGS: Heart remains moderately enlarged. Postoperative changes from aortic valvular repair are noted. Double lead right subclavian pacemaker device and leads are stable and intact. Normal pulmonary vascularity. Low volumes. Subsegmental atelectasis at the left base is unchanged. No pneumothorax. Right lower lateral rib deformity is stable. IMPRESSION: No evidence of CHF. Subsegmental atelectasis at the left base. Electronically Signed   By: Jolaine Click M.D.   On: 06/25/2016 07:54   Dg Chest 2 View  Result Date: 06/23/2016 CLINICAL DATA:  CHF, shortness of breath EXAM: CHEST  2 VIEW COMPARISON:  06/15/2016 FINDINGS: Right-sided duo lead pacemaker similar compared to prior. Valvular prosthesis. Mild increased atelectasis or infiltrate at the left lung base. Suspect tiny left pleural effusion. Stable enlarged cardiomediastinal without overt failure. Atherosclerosis. No pneumothorax. IMPRESSION: 1. Increasing atelectasis or infiltrate at the left base with possible tiny left effusion 2. Stable cardiomegaly  Electronically Signed   By: Jasmine Pang M.D.   On: 06/23/2016 14:23  Nm Myocar Multi W/spect W/wall Motion / Ef  Result Date: 06/16/2016 CLINICAL DATA:  Chest pain. EXAM: MYOCARDIAL IMAGING WITH SPECT (REST AND PHARMACOLOGIC-STRESS) GATED LEFT VENTRICULAR WALL MOTION STUDY LEFT VENTRICULAR EJECTION FRACTION TECHNIQUE: Standard myocardial SPECT imaging was performed after resting intravenous injection of 10 mCi Tc-57m tetrofosmin. Subsequently, intravenous infusion of Lexiscan was performed under the supervision of the Cardiology staff. At peak effect of the drug, 30 mCi Tc-67m tetrofosmin was injected intravenously and standard myocardial SPECT imaging was performed. Quantitative gated imaging was also performed to evaluate left ventricular wall motion, and estimate left ventricular ejection fraction. COMPARISON:  None. FINDINGS: Perfusion: Moderate to large fixed defect involving the inferior wall and part of the apex consistent with scar. No reversible defects to suggest ischemia. Wall Motion: Wall motion abnormality involving the inferior wall and apex. Moderate left ventricular dilatation. Left Ventricular Ejection Fraction: 42 % End diastolic volume 122 ml End systolic volume 70 ml IMPRESSION: 1. Infarction/scar involving the inferior wall and part of the apex. No reversible defects to suggest ischemia. 2. Wall motion abnormality involving the inferior wall and part of the apex. Moderate left ventricular dilatation. 3. Left ventricular ejection fraction 42% 4. Non invasive risk stratification*: High risk. *2012 Appropriate Use Criteria for Coronary Revascularization Focused Update: J Am Coll Cardiol. 2012;59(9):857-881. http://content.dementiazones.com.aspx?articleid=1201161 Electronically Signed   By: Rudie Meyer M.D.   On: 06/16/2016 09:20   Dg Chest Port 1 View  Result Date: 06/15/2016 CLINICAL DATA:  Patient admitted with respiratory distress 06/13/2016. EXAM: PORTABLE CHEST 1 VIEW  COMPARISON:  Single-view of the chest 06/13/2016. CT chest 12/14/2014. PA and lateral chest 11/29/2014. FINDINGS: There is cardiomegaly. The patient is status post aortic valve repair. Pacing device is in place. Atherosclerosis is noted. Pulmonary edema seen on the most recent examination is markedly improved. No pneumothorax or pleural effusion. IMPRESSION: Markedly improved pulmonary edema. Cardiomegaly. Atherosclerosis. Electronically Signed   By: Drusilla Kanner M.D.   On: 06/15/2016 14:15   Dg Chest Port 1 View  Result Date: 06/13/2016 CLINICAL DATA:  83 year old female with shortness of breath. EXAM: PORTABLE CHEST 1 VIEW COMPARISON:  Chest CT dated 12/14/2014 under radiograph dated 11/29/2014 FINDINGS: There is stable cardiomegaly. An aortic valve stent is noted. Central vascular prominence and diffuse interstitial densities noted likely mild vascular congestion and edema. Bilateral lower lung field hazy densities may represent atelectasis versus infiltrate. There is blunting of the left costophrenic angle which may represent a small pleural effusion. There is no pneumothorax. Right pectoral pacemaker device noted. No acute osseous pathology. IMPRESSION: 1. Cardiomegaly with probable mild congestive changes and interstitial edema. 2. Bibasilar atelectasis versus infiltrate. 3. Probable small left pleural effusion. Electronically Signed   By: Elgie Collard M.D.   On: 06/13/2016 05:18    Microbiology: No results found for this or any previous visit (from the past 240 hour(s)).   Labs: Basic Metabolic Panel:  Recent Labs Lab 06/21/16 0544 06/22/16 0627 06/23/16 0843 06/24/16 0428 06/25/16 0534  NA 141 136 137 137 139  K 4.3 4.3 4.8 4.8 4.1  CL 101 101 103 104 102  CO2 29 28 27 24 30   GLUCOSE 92 83 112* 88 92  BUN 73* 70* 62* 56* 52*  CREATININE 2.72* 2.63* 2.56* 2.35* 2.64*  CALCIUM 9.3 8.8* 9.3 9.7 10.0   Liver Function Tests: No results for input(s): AST, ALT, ALKPHOS,  BILITOT, PROT, ALBUMIN in the last 168 hours. No results for input(s): LIPASE, AMYLASE in the last 168 hours. No results  for input(s): AMMONIA in the last 168 hours. CBC:  Recent Labs Lab 06/20/16 0341 06/21/16 0544 06/22/16 0627 06/23/16 0843 06/24/16 0428  WBC 9.4 8.9 8.1 8.9 7.2  HGB 10.9* 10.6* 10.0* 11.0* 10.1*  HCT 33.5* 32.8* 30.0* 34.0* 30.7*  MCV 93.1 92.9 92.6 93.4 93.9  PLT 213 195 183 213 178   Cardiac Enzymes:  Recent Labs Lab 06/22/16 1414 06/22/16 1811 06/23/16 0049  TROPONINI 0.03* 0.03* <0.03   BNP: BNP (last 3 results)  Recent Labs  06/13/16 0530  BNP 528.3*    ProBNP (last 3 results) No results for input(s): PROBNP in the last 8760 hours.  CBG:  Recent Labs Lab 06/24/16 1233 06/24/16 1655 06/24/16 2047 06/25/16 0755 06/25/16 1130  GLUCAP 89 109* 109* 93 106*       SignedZannie Cove MD.  [facility] 06/25/2016, 12:01 PM

## 2016-06-25 NOTE — Discharge Instructions (Signed)
Information on my medicine - Coumadin   (Warfarin)-- on this medication prior to this hospitalization.   Why was Coumadin prescribed for you?--On coumadin prior to this hospitalization  Coumadin was prescribed for you because you have a blood clot or a medical condition that can cause an increased risk of forming blood clots. Blood clots can cause serious health problems by blocking the flow of blood to the heart, lung, or brain. Coumadin can prevent harmful blood clots from forming. As a reminder your indication for Coumadin is:   Stroke Prevention Because Of Atrial Fibrillation  What test will check on my response to Coumadin? While on Coumadin (warfarin) you will need to have an INR test regularly to ensure that your dose is keeping you in the desired range. The INR (international normalized ratio) number is calculated from the result of the laboratory test called prothrombin time (PT).  If an INR APPOINTMENT HAS NOT ALREADY BEEN MADE FOR YOU please schedule an appointment to have this lab work done by your health care provider within 7 days. Your INR goal is usually a number between:  2 to 3 or your provider may give you a more narrow range like 2-2.5.  Ask your health care provider during an office visit what your goal INR is.  What  do you need to  know  About  COUMADIN? Take Coumadin (warfarin) exactly as prescribed by your healthcare provider about the same time each day.  DO NOT stop taking without talking to the doctor who prescribed the medication.  Stopping without other blood clot prevention medication to take the place of Coumadin may increase your risk of developing a new clot or stroke.  Get refills before you run out.  What do you do if you miss a dose? If you miss a dose, take it as soon as you remember on the same day then continue your regularly scheduled regimen the next day.  Do not take two doses of Coumadin at the same time.  Important Safety Information A possible side  effect of Coumadin (Warfarin) is an increased risk of bleeding. You should call your healthcare provider right away if you experience any of the following: ? Bleeding from an injury or your nose that does not stop. ? Unusual colored urine (red or dark brown) or unusual colored stools (red or black). ? Unusual bruising for unknown reasons. ? A serious fall or if you hit your head (even if there is no bleeding).  Some foods or medicines interact with Coumadin (warfarin) and might alter your response to warfarin. To help avoid this: ? Eat a balanced diet, maintaining a consistent amount of Vitamin K. ? Notify your provider about major diet changes you plan to make. ? Avoid alcohol or limit your intake to 1 drink for women and 2 drinks for men per day. (1 drink is 5 oz. wine, 12 oz. beer, or 1.5 oz. liquor.)  Make sure that ANY health care provider who prescribes medication for you knows that you are taking Coumadin (warfarin).  Also make sure the healthcare provider who is monitoring your Coumadin knows when you have started a new medication including herbals and non-prescription products.  Coumadin (Warfarin)  Major Drug Interactions  Increased Warfarin Effect Decreased Warfarin Effect  Alcohol (large quantities) Antibiotics (esp. Septra/Bactrim, Flagyl, Cipro) Amiodarone (Cordarone) Aspirin (ASA) Cimetidine (Tagamet) Megestrol (Megace) NSAIDs (ibuprofen, naproxen, etc.) Piroxicam (Feldene) Propafenone (Rythmol SR) Propranolol (Inderal) Isoniazid (INH) Posaconazole (Noxafil) Barbiturates (Phenobarbital) Carbamazepine (Tegretol) Chlordiazepoxide (Librium) Cholestyramine (Questran)  Griseofulvin Oral Contraceptives Rifampin Sucralfate (Carafate) Vitamin K   Coumadin (Warfarin) Major Herbal Interactions  Increased Warfarin Effect Decreased Warfarin Effect  Garlic Ginseng Ginkgo biloba Coenzyme Q10 Green tea St. Johns wort    Coumadin (Warfarin) FOOD Interactions  Eat a  consistent number of servings per week of foods HIGH in Vitamin K (1 serving =  cup)  Collards (cooked, or boiled & drained) Kale (cooked, or boiled & drained) Mustard greens (cooked, or boiled & drained) Parsley *serving size only =  cup Spinach (cooked, or boiled & drained) Swiss chard (cooked, or boiled & drained) Turnip greens (cooked, or boiled & drained)  Eat a consistent number of servings per week of foods MEDIUM-HIGH in Vitamin K (1 serving = 1 cup)  Asparagus (cooked, or boiled & drained) Broccoli (cooked, boiled & drained, or raw & chopped) Brussel sprouts (cooked, or boiled & drained) *serving size only =  cup Lettuce, raw (green leaf, endive, romaine) Spinach, raw Turnip greens, raw & chopped   These websites have more information on Coumadin (warfarin):  http://www.king-russell.com/; https://www.hines.net/;

## 2016-06-25 NOTE — Progress Notes (Signed)
Report received 

## 2016-06-25 NOTE — Progress Notes (Addendum)
ANTICOAGULATION CONSULT NOTE - Follow Up Consult  Pharmacy Consult for Warfarin Indication: atrial fibrillation  Assessment: 83 yo F on PTA warfarin for history of PAF.  INR was SUBtherapeutic 1.68 on admit 2/23 with last dosePTA  taken on 06/11/16.  Warfarin was being held for cath but plan was changed, decided no plan for cath and warfarin resumed on 06/18/16.  Today INR = 1.70, still subtherapeutic.  INR was trending up but not much change today.  CBC on 3/6: Hgb is low but stable, platelets wnl at 178k. No overt bleeding noted.  No DDI except  Amiodarone but she was taking both amiodarone and coumadin PTA so not a new DDI.  Currently on SQ heparin bridge until INR therapeutic.  Eating well  PTA dose: 1.5 mg daily,  Last taken PTA on 06/11/16 , admitted 2/23, INR was 1.68 on admit  Goal of Therapy:  INR 2-3 Monitor platelets by anticoagulation protocol: Yes   Plan:  Warfarin 4 mg tonight x1 Daily INR Monitor s/sx of bleeding Continue sq heparin for dvt px until INR =/>2  For discharge, consider increasing coumadin dose to 3mg  daily and close monitoring of INR after discharge.  Recommend check INR within 2 days post discharge.     Allergies  Allergen Reactions  . Nubain [Nalbuphine Hcl] Hives    Went into cardiac arrest   . Codeine Hives and Nausea Only  . Darvocet [Propoxyphene N-Acetaminophen] Nausea Only    Patient Measurements: Height: 4\' 9"  (144.8 cm) Weight: 174 lb 1.6 oz (79 kg) (scale c) IBW/kg (Calculated) : 38.6  Vital Signs: Temp: 98.7 F (37.1 C) (03/07 1215) Temp Source: Oral (03/07 1215) BP: 93/41 (03/07 1215) Pulse Rate: 64 (03/07 1215)  Labs:  Recent Labs  06/22/16 1811 06/23/16 0049 06/23/16 0843 06/24/16 0428 06/25/16 0534  HGB  --   --  11.0* 10.1*  --   HCT  --   --  34.0* 30.7*  --   PLT  --   --  213 178  --   LABPROT  --  19.0*  --  20.3* 20.2*  INR  --  1.58  --  1.71 1.70  CREATININE  --   --  2.56* 2.35* 2.64*  TROPONINI 0.03*  <0.03  --   --   --     Estimated Creatinine Clearance: 14.2 mL/min (by C-G formula based on SCr of 2.64 mg/dL (H)).  Thank you for allowing pharmacy to be part of this patients care team. Noah Delaine, RPh Clinical Pharmacist Pager: [phone]-[phone] Main Pharmacy [phone] 06/25/2016 4:17 PM

## 2016-06-25 NOTE — Progress Notes (Signed)
Physical Therapy Treatment Patient Details Name: [name] MRN: [id number] DOB: 10/28/1932 Today's Date: 06/25/2016    History of Present Illness [name] is a 83 y.o. female with PMHx: diastolic HF, CKD stage IV, aortic stenosis, dementia, chronic pain, HTN, paroxysmal A-fib, right patella tendon repair, remote lt patellar tendon repair. Admitted with  respiratory distress with xray revealing mild CHF.    PT Comments    Pt progressing well with mobility.  Emphasis on strengthening then gait training with stress on safety and stamina.   Follow Up Recommendations  SNF     Equipment Recommendations  None recommended by PT    Recommendations for Other Services       Precautions / Restrictions Precautions Precautions: Fall    Mobility  Bed Mobility               General bed mobility comments: Pt sitting in recliner upon arrival  Transfers Overall transfer level: Needs assistance Equipment used: Rolling walker (2 wheeled) Transfers: Sit to/from Stand Sit to Stand: Min guard;Min assist (min from chair without arms) Stand pivot transfers: Min assist       General transfer comment: cues for hand placement  Ambulation/Gait Ambulation/Gait assistance: Min guard Ambulation Distance (Feet): 56 Feet (then 18 additional feet back to the recliner) Assistive device: Rolling walker (2 wheeled) Gait Pattern/deviations: Step-through pattern;Decreased step length - right;Decreased step length - left;Decreased stride length Gait velocity: decreased Gait velocity interpretation: Below normal speed for age/gender General Gait Details: cues for better safety with RW and postural checks, but generally steady with shorter distances before she fatigues.   Stairs            Wheelchair Mobility    Modified Rankin (Stroke Patients Only)       Balance Overall balance assessment: Needs assistance   Sitting balance-Leahy Scale: Fair     Standing balance  support: Bilateral upper extremity supported Standing balance-Leahy Scale: Poor Standing balance comment: needs external support and/or RW                    Cognition Arousal/Alertness: Awake/alert Behavior During Therapy: WFL for tasks assessed/performed Overall Cognitive Status: Within Functional Limits for tasks assessed                      Exercises General Exercises - Lower Extremity Heel Slides: AROM;Strengthening;Both;10 reps;Seated (graded resistance extention) Hip Flexion/Marching: AROM;Strengthening;Both;10 reps;Seated Other Exercises Other Exercises: bicep/tricep presses bil x10    General Comments General comments (skin integrity, edema, etc.): sats at rest on RA 94/95% at 68 bpm; immediately after walking on RA with RW at 93% at 79 bpm.      Pertinent Vitals/Pain Pain Assessment: Faces Faces Pain Scale: Hurts a little bit Pain Location: shoulder, neck Pain Descriptors / Indicators: Aching;Grimacing;Sore Pain Intervention(s): Monitored during session;Repositioned    Home Living                      Prior Function            PT Goals (current goals can now be found in the care plan section) Acute Rehab PT Goals Patient Stated Goal: to be able to walk more PT Goal Formulation: With patient Time For Goal Achievement: 06/30/16 Potential to Achieve Goals: Good    Frequency    Min 3X/week      PT Plan Current plan remains appropriate    Co-evaluation  End of Session   Activity Tolerance: Patient tolerated treatment well Patient left: in chair;with call bell/phone within reach;with chair alarm set Nurse Communication: Mobility status PT Visit Diagnosis: Difficulty in walking, not elsewhere classified (R26.2)     Time: 1610-9604 PT Time Calculation (min) (ACUTE ONLY): 20 min  Charges:  $Gait Training: 8-22 mins                    G CodesEliseo Gum Taneil Lazarus 06/25/2016, 1:07 PM 06/25/2016  [place], PT [phone]  (pager)

## 2016-06-26 LAB — BASIC METABOLIC PANEL
Anion gap: 10 (ref 5–15)
BUN: 71 mg/dL — ABNORMAL HIGH (ref 6–20)
CALCIUM: 9.8 mg/dL (ref 8.9–10.3)
CO2: 28 mmol/L (ref 22–32)
CREATININE: 3.27 mg/dL — AB (ref 0.44–1.00)
Chloride: 100 mmol/L — ABNORMAL LOW (ref 101–111)
GFR calc Af Amer: 14 mL/min — ABNORMAL LOW (ref >60)
GFR, EST NON AFRICAN AMERICAN: 12 mL/min — AB (ref >60)
GLUCOSE: 86 mg/dL (ref 65–99)
Potassium: 4.6 mmol/L (ref 3.5–5.1)
SODIUM: 138 mmol/L (ref 135–145)

## 2016-06-26 LAB — GLUCOSE, CAPILLARY
GLUCOSE-CAPILLARY: 136 mg/dL — AB (ref 65–99)
Glucose-Capillary: 91 mg/dL (ref 65–99)
Glucose-Capillary: 116 mg/dL — ABNORMAL HIGH (ref 65–99)
Glucose-Capillary: 150 mg/dL — ABNORMAL HIGH (ref 65–99)

## 2016-06-26 LAB — PROTIME-INR
INR: 1.91
PROTHROMBIN TIME: 22.2 s — AB (ref 11.4–15.2)

## 2016-06-26 MED ORDER — WARFARIN SODIUM 2 MG PO TABS
4.0000 mg | ORAL_TABLET | Freq: Once | ORAL | Status: AC
  Administered 2016-06-26: 4 mg via ORAL
  Filled 2016-06-26: qty 2

## 2016-06-26 MED ORDER — HYDRALAZINE HCL 25 MG PO TABS
25.0000 mg | ORAL_TABLET | Freq: Three times a day (TID) | ORAL | Status: DC
  Administered 2016-06-26 – 2016-06-27 (×2): 25 mg via ORAL
  Filled 2016-06-26 (×3): qty 1

## 2016-06-26 MED ORDER — ISOSORBIDE MONONITRATE ER 30 MG PO TB24
30.0000 mg | ORAL_TABLET | Freq: Every day | ORAL | Status: DC
  Administered 2016-06-26 – 2016-06-27 (×2): 30 mg via ORAL
  Filled 2016-06-26 (×2): qty 1

## 2016-06-26 MED ORDER — SODIUM CHLORIDE 0.9 % IV SOLN
INTRAVENOUS | Status: DC
  Administered 2016-06-26 – 2016-06-27 (×2): via INTRAVENOUS

## 2016-06-26 NOTE — Progress Notes (Signed)
Patient refused 2 senokot pills, would take one.

## 2016-06-26 NOTE — Progress Notes (Signed)
ANTICOAGULATION CONSULT NOTE - Follow Up Consult  Pharmacy Consult for Warfarin Indication: atrial fibrillation  Assessment: CC/HPI: respiratory distress  PMH: OSA, CVA, sciatica, CKD, AS, asthma, afib, CAD, dementia, CHF, fibromyalgia, HTN, HLD, neuropathy, hypothyroid, PE  Anticoag: Warfarin for hx afib, hep sq until INR > 2  INR today 1.91  - PTA dose 1.5mg  daily (INR 1.68 on admit date 2/23, last taken 2/21 Sq heparin continues til INR =/>2  Renal: CKD stage IV (baseline SCr ~2):  SCr 3.27  Heme/Onc: H&H 10.1/30.7, Plt 178  Goal of Therapy:  INR 2-3 Monitor platelets by anticoagulation protocol: Yes   Plan:  Warfarin 4 mg tonight x1 Daily INR, CBC in am Monitor s/sx of bleeding Continue sq heparin for dvt px until INR =/>2   For discharge, consider increasing coumadin dose to 3mg  daily and close monitoring of INR after discharge.  Recommend check INR within 2 days post discharge.     Allergies  Allergen Reactions  . Nubain [Nalbuphine Hcl] Hives    Went into cardiac arrest   . Codeine Hives and Nausea Only  . Darvocet [Propoxyphene N-Acetaminophen] Nausea Only    Patient Measurements: Height: 4\' 9"  (144.8 cm) Weight: 174 lb 12.8 oz (79.3 kg) IBW/kg (Calculated) : 38.6  Vital Signs: Temp: 98.1 F (36.7 C) (03/08 0508) Temp Source: Oral (03/08 0508) BP: 133/71 (03/08 1042) Pulse Rate: 67 (03/08 1042)  Labs:  Recent Labs  06/24/16 0428 06/25/16 0534 06/26/16 0414  HGB 10.1*  --   --   HCT 30.7*  --   --   PLT 178  --   --   LABPROT 20.3* 20.2* 22.2*  INR 1.71 1.70 1.91  CREATININE 2.35* 2.64* 3.27*    Estimated Creatinine Clearance: 11.5 mL/min (by C-G formula based on SCr of 3.27 mg/dL (H)).  Isaac Bliss, PharmD, BCPS, BCCCP Clinical Pharmacist Clinical phone for 06/26/2016 from 7a-3:30p: [phone] If after 3:30p, please call main pharmacy at: [phone] 06/26/2016 11:17 AM

## 2016-06-26 NOTE — Progress Notes (Signed)
The patient is evaluated this afternoon. She is sitting up in a chair having lunch. States her legs are free of edema for the first time in 4 years. She feels good, but notes that her creatinine is elevated significantly. Discussed case with Dr. Jomarie Longs. Plan to hold diuretics and gently hydrate today. Repeat creatinine tomorrow. If stable would plan for hospital discharge with follow-up next week. I will follow-up with her in the morning.  Tonny Bollman 06/26/2016 1:55 PM

## 2016-06-26 NOTE — Progress Notes (Signed)
Patient ID: [name], female   DOB: 25-Jan-1933, 83 y.o.   MRN: [id number]  PROGRESS NOTE    [name]  MMC:[id number] DOB: 1932/06/23 DOA: 06/13/2016  PCP: Oneal Grout, MD   Brief Narrative:  83 year old female with past medical history significant for chronic systolic and diastolic CHF, chronic kidney disease stage IV, aortic stenosis status post TAVR, history of dementia, paroxysmal atrial fibrillation on Coumadin patient lives in [facility]. She was brought 06/13/2016 to [facility] for evaluation of acute respiratory distress. Patient was found to have acute combined systolic and diastolic congestive heart failure. She was seen by cardiology in consultation, was diuresed with symptom improvement. She underwent stress test 06/16/2016 with findings of infarction involving inferior wall and part of the apex with no reversible defects to suggest ischemia .   Assessment & Plan:   Principal Problem: Acute on chronic systolic and diastolic heart failure, NYHA class 3 (HCC) - Echo on this admission showedejection fraction 35-40% with inferior, inferior septal and anterior walls hypokinesis which is new compared to echo in 2016, grade 2 diastolic dysfunction, Myoview showed large scar and no inducible ischemia, no cath due to CKD -improved with diuresis, medical mgt, followed by Cardiology, diuresed with IV lasix, negative 9.3L, then switched to torsemide 100 mg daily. Torsemide then placed on hold 3/3 due to worsening renal function  - Torsemide restarted at 60mg  daily yesterday, but creatinine 3.2 now, still stop torsemide, give gentle IVF today -D/w Dr.Cooper, SNF tomorrow if creatinine stable or improved and will hold diuretics for atleast 3days at discharge -needs Close FU with Dr.Cooper  Active Problems:   Paroxysmal atrial fibrillation (HCC) - CHADS vasc score 6 - Continue rate controlled with amiodarone 100 mg daily and Coreg 25 mg BID - Continue  Coumadin, INR 1.9 today    Essential hypertension - Continue Coreg 25 mg BID,  -Bp soft now, will cut down hydralazine and imdur    Dementia without behavioral disturbance - Continue Namenda 5 mg twice daily, Aricept 10 mg at bedtime, Cymbalta 60 mg at bedtime - Stable mental status   Chest pain - No obstructive disease on cath in 2014 - Stress test on this admission showed infarction involving the inferior wall and part of the apex with no reversible defects to suggest ischemia, ejection fraction 42%, high risk study - seen by Cath, no cath due to CKD    Hx of [id number]. see Echo - Stable based on recent ECHO  Hypothyroidism - Continue Synthroid 75 g daily  Dyslipidemia - Continue Lipitor 10 mg at bedtime  Anemia of chronic kidney disease  - Hemoglobin stable at 10  Depression  - Continue Cymbalta. Lyrica -dose lowered due to renal insufficiency   DVT prophylaxis: On anticoagulation with Coumadin Code Status: full code  Family Communication: family not at the bedside this am Disposition Plan:  SNF tomorrow if creat stable or better   Consultants:   Cardiology  Nephrology  Procedures:   Echo 06/13/2016 showed ejection fraction 35-40% with inferior hypokinesis, inferior septal and anterior walls hypokinesis which is new compared to echo in 2016; grade 2 diastolic dysfunction  Antimicrobials:   None   Subjective: No overnight events, upset about lowering of Lyrica dose  Objective: Vitals:   06/25/16 1215 06/25/16 2148 06/26/16 0508 06/26/16 1042  BP: (!) 93/41 (!) 129/51 (!) 121/47 133/71  Pulse: 64 68 62 67  Resp: 18 18 18    Temp: 98.7 F (37.1 C) 98.2 F (36.8  C) 98.1 F (36.7 C)   TempSrc: Oral Oral Oral   SpO2: 96% 97% 95%   Weight:   79.3 kg (174 lb 12.8 oz)   Height:        Intake/Output Summary (Last 24 hours) at 06/26/16 1116 Last data filed at 06/26/16 0800  Gross per 24 hour  Intake              360 ml  Output               900 ml  Net             -540 ml   Filed Weights   06/24/16 0446 06/25/16 0442 06/26/16 0508  Weight: 80.3 kg (177 lb 1.6 oz) 79 kg (174 lb 1.6 oz) 79.3 kg (174 lb 12.8 oz)    Examination:  General exam: no distress, calm and comfortable  Respiratory system: No wheezing, no rhonchi Cardiovascular system: S1 & S2 heard, Rate controlled Gastrointestinal system: (+) BS, no distention Central nervous system: Nonfocal   Extremities: Palpable pulses bilaterally Skin: warm and dry, no lesions, no ulcers  Psychiatry: Normal mood and behavior, no agitation or restlessness   Data Reviewed: I have personally reviewed following labs and imaging studies  CBC:  Recent Labs Lab 06/20/16 0341 06/21/16 0544 06/22/16 0627 06/23/16 0843 06/24/16 0428  WBC 9.4 8.9 8.1 8.9 7.2  HGB 10.9* 10.6* 10.0* 11.0* 10.1*  HCT 33.5* 32.8* 30.0* 34.0* 30.7*  MCV 93.1 92.9 92.6 93.4 93.9  PLT 213 195 183 213 178   Basic Metabolic Panel:  Recent Labs Lab 06/22/16 0627 06/23/16 0843 06/24/16 0428 06/25/16 0534 06/26/16 0414  NA 136 137 137 139 138  K 4.3 4.8 4.8 4.1 4.6  CL 101 103 104 102 100*  CO2 28 27 24 30 28   GLUCOSE 83 112* 88 92 86  BUN 70* 62* 56* 52* 71*  CREATININE 2.63* 2.56* 2.35* 2.64* 3.27*  CALCIUM 8.8* 9.3 9.7 10.0 9.8   GFR: Estimated Creatinine Clearance: 11.5 mL/min (by C-G formula based on SCr of 3.27 mg/dL (H)). Liver Function Tests: No results for input(s): AST, ALT, ALKPHOS, BILITOT, PROT, ALBUMIN in the last 168 hours. No results for input(s): LIPASE, AMYLASE in the last 168 hours. No results for input(s): AMMONIA in the last 168 hours. Coagulation Profile:  Recent Labs Lab 06/22/16 0627 06/23/16 0049 06/24/16 0428 06/25/16 0534 06/26/16 0414  INR 1.45 1.58 1.71 1.70 1.91   Cardiac Enzymes:  Recent Labs Lab 06/22/16 1414 06/22/16 1811 06/23/16 0049  TROPONINI 0.03* 0.03* <0.03   BNP (last 3 results) No results for input(s): PROBNP in the last  8760 hours. HbA1C: No results for input(s): HGBA1C in the last 72 hours. CBG:  Recent Labs Lab 06/25/16 0755 06/25/16 1130 06/25/16 1634 06/25/16 2141 06/26/16 0740  GLUCAP 93 106* 96 102* 91   Lipid Profile: No results for input(s): CHOL, HDL, LDLCALC, TRIG, CHOLHDL, LDLDIRECT in the last 72 hours. Thyroid Function Tests: No results for input(s): TSH, T4TOTAL, FREET4, T3FREE, THYROIDAB in the last 72 hours. Anemia Panel: No results for input(s): VITAMINB12, FOLATE, FERRITIN, TIBC, IRON, RETICCTPCT in the last 72 hours. Urine analysis:    Component Value Date/Time   COLORURINE YELLOW 06/19/2015 1339   APPEARANCEUR CLEAR 06/19/2015 1339   LABSPEC 1.007 06/19/2015 1339   PHURINE 6.0 06/19/2015 1339   GLUCOSEU NEGATIVE 06/19/2015 1339   HGBUR NEGATIVE 06/19/2015 1339   BILIRUBINUR NEGATIVE 06/19/2015 1339   KETONESUR NEGATIVE 06/19/2015 1339  PROTEINUR NEGATIVE 06/19/2015 1339   UROBILINOGEN 0.2 11/29/2014 1411   NITRITE NEGATIVE 06/19/2015 1339   LEUKOCYTESUR NEGATIVE 06/19/2015 1339   Sepsis Labs: @LABRCNTIP (procalcitonin:4,lacticidven:4)   Recent Results (from the past 240 hour(s))  MRSA PCR Screening     Status: Abnormal   Collection Time: 06/13/16 12:21 PM  Result Value Ref Range Status   MRSA by PCR POSITIVE (A) NEGATIVE Final      Radiology Studies: Nm Myocar Multi W/spect W/wall Motion / Ef Result Date: 06/16/2016 1. Infarction/scar involving the inferior wall and part of the apex. No reversible defects to suggest ischemia. 2. Wall motion abnormality involving the inferior wall and part of the apex. Moderate left ventricular dilatation. 3. Left ventricular ejection fraction 42% 4. Non invasive risk stratification*: High risk. *2012 Appropriate Use Criteria for Coronary Revascularization Focused Update: J Am Coll Cardiol. 2012;59(9):857-881. http://[email or web address]?[email or web address] Electronically Signed   By: Rudie Meyer M.D.   On:  06/16/2016 09:20   Dg Chest Port 1 View Result Date: 06/15/2016 Markedly improved pulmonary edema. Cardiomegaly. Atherosclerosis.     Scheduled Meds: . amiodarone  100 mg Oral Daily  . atorvastatin  10 mg Oral q1800  . calcitRIOL  0.25 mcg Oral q morning - 10a  . carvedilol  25 mg Oral BID  . cycloSPORINE  1 drop Both Eyes BID  . donepezil  10 mg Oral QHS  . DULoxetine  60 mg Oral QPM  . febuxostat  40 mg Oral QPM  . heparin  5,000 Units Subcutaneous Q8H  . hydrALAZINE  25 mg Oral Q8H  . insulin aspart  0-15 Units Subcutaneous TID WC  . insulin aspart  0-5 Units Subcutaneous QHS  . isosorbide mononitrate  30 mg Oral Daily  . levothyroxine  75 mcg Oral QAC breakfast  . linaclotide  145 mcg Oral Daily  . loratadine  10 mg Oral Daily  . mouth rinse  15 mL Mouth Rinse BID  . memantine  5 mg Oral BID  . MUSCLE RUB   Topical BID  . pantoprazole  40 mg Oral Daily  . pregabalin  50 mg Oral QHS  . senna  2 tablet Oral BID  . Warfarin - Pharmacist Dosing Inpatient   Does not apply q1800   Continuous Infusions: . sodium chloride 50 mL/hr at 06/26/16 0835     LOS: 13 days    Time spent: 25 minutes  Greater than 50% of the time spent on counseling and coordinating the care.   Zannie Cove, MD [facility] Pager [phone]  If 7PM-7AM, please contact night-coverage [email or web address] Password [email or web address] 06/26/2016, 11:16 AM

## 2016-06-27 DIAGNOSIS — Z950 Presence of cardiac pacemaker: Secondary | ICD-10-CM | POA: Diagnosis not present

## 2016-06-27 DIAGNOSIS — D638 Anemia in other chronic diseases classified elsewhere: Secondary | ICD-10-CM | POA: Diagnosis not present

## 2016-06-27 DIAGNOSIS — J9600 Acute respiratory failure, unspecified whether with hypoxia or hypercapnia: Secondary | ICD-10-CM | POA: Diagnosis not present

## 2016-06-27 DIAGNOSIS — R2681 Unsteadiness on feet: Secondary | ICD-10-CM | POA: Diagnosis not present

## 2016-06-27 DIAGNOSIS — R531 Weakness: Secondary | ICD-10-CM | POA: Diagnosis not present

## 2016-06-27 DIAGNOSIS — W1830XA Fall on same level, unspecified, initial encounter: Secondary | ICD-10-CM | POA: Diagnosis present

## 2016-06-27 DIAGNOSIS — N184 Chronic kidney disease, stage 4 (severe): Secondary | ICD-10-CM | POA: Diagnosis not present

## 2016-06-27 DIAGNOSIS — S6992XA Unspecified injury of left wrist, hand and finger(s), initial encounter: Secondary | ICD-10-CM | POA: Diagnosis not present

## 2016-06-27 DIAGNOSIS — R791 Abnormal coagulation profile: Secondary | ICD-10-CM | POA: Diagnosis not present

## 2016-06-27 DIAGNOSIS — I480 Paroxysmal atrial fibrillation: Secondary | ICD-10-CM | POA: Diagnosis not present

## 2016-06-27 DIAGNOSIS — S40011A Contusion of right shoulder, initial encounter: Secondary | ICD-10-CM | POA: Diagnosis not present

## 2016-06-27 DIAGNOSIS — K219 Gastro-esophageal reflux disease without esophagitis: Secondary | ICD-10-CM | POA: Diagnosis not present

## 2016-06-27 DIAGNOSIS — J45909 Unspecified asthma, uncomplicated: Secondary | ICD-10-CM | POA: Diagnosis not present

## 2016-06-27 DIAGNOSIS — S0083XA Contusion of other part of head, initial encounter: Secondary | ICD-10-CM | POA: Diagnosis not present

## 2016-06-27 DIAGNOSIS — G8929 Other chronic pain: Secondary | ICD-10-CM | POA: Diagnosis not present

## 2016-06-27 DIAGNOSIS — J189 Pneumonia, unspecified organism: Secondary | ICD-10-CM | POA: Diagnosis not present

## 2016-06-27 DIAGNOSIS — D631 Anemia in chronic kidney disease: Secondary | ICD-10-CM | POA: Diagnosis not present

## 2016-06-27 DIAGNOSIS — I25119 Atherosclerotic heart disease of native coronary artery with unspecified angina pectoris: Secondary | ICD-10-CM | POA: Diagnosis not present

## 2016-06-27 DIAGNOSIS — I959 Hypotension, unspecified: Secondary | ICD-10-CM | POA: Diagnosis not present

## 2016-06-27 DIAGNOSIS — S0990XA Unspecified injury of head, initial encounter: Secondary | ICD-10-CM | POA: Diagnosis not present

## 2016-06-27 DIAGNOSIS — J989 Respiratory disorder, unspecified: Secondary | ICD-10-CM | POA: Diagnosis not present

## 2016-06-27 DIAGNOSIS — Y92121 Bathroom in nursing home as the place of occurrence of the external cause: Secondary | ICD-10-CM | POA: Diagnosis not present

## 2016-06-27 DIAGNOSIS — G4730 Sleep apnea, unspecified: Secondary | ICD-10-CM | POA: Diagnosis not present

## 2016-06-27 DIAGNOSIS — J9602 Acute respiratory failure with hypercapnia: Secondary | ICD-10-CM | POA: Diagnosis not present

## 2016-06-27 DIAGNOSIS — Z90710 Acquired absence of both cervix and uterus: Secondary | ICD-10-CM | POA: Diagnosis not present

## 2016-06-27 DIAGNOSIS — M25532 Pain in left wrist: Secondary | ICD-10-CM | POA: Diagnosis not present

## 2016-06-27 DIAGNOSIS — I10 Essential (primary) hypertension: Secondary | ICD-10-CM | POA: Diagnosis not present

## 2016-06-27 DIAGNOSIS — H9190 Unspecified hearing loss, unspecified ear: Secondary | ICD-10-CM | POA: Diagnosis not present

## 2016-06-27 DIAGNOSIS — R5381 Other malaise: Secondary | ICD-10-CM | POA: Diagnosis not present

## 2016-06-27 DIAGNOSIS — I495 Sick sinus syndrome: Secondary | ICD-10-CM | POA: Diagnosis not present

## 2016-06-27 DIAGNOSIS — J9601 Acute respiratory failure with hypoxia: Secondary | ICD-10-CM | POA: Diagnosis not present

## 2016-06-27 DIAGNOSIS — S199XXA Unspecified injury of neck, initial encounter: Secondary | ICD-10-CM | POA: Diagnosis not present

## 2016-06-27 DIAGNOSIS — N183 Chronic kidney disease, stage 3 (moderate): Secondary | ICD-10-CM | POA: Diagnosis not present

## 2016-06-27 DIAGNOSIS — R0902 Hypoxemia: Secondary | ICD-10-CM | POA: Diagnosis not present

## 2016-06-27 DIAGNOSIS — I130 Hypertensive heart and chronic kidney disease with heart failure and stage 1 through stage 4 chronic kidney disease, or unspecified chronic kidney disease: Secondary | ICD-10-CM | POA: Diagnosis not present

## 2016-06-27 DIAGNOSIS — W19XXXD Unspecified fall, subsequent encounter: Secondary | ICD-10-CM | POA: Diagnosis not present

## 2016-06-27 DIAGNOSIS — M6281 Muscle weakness (generalized): Secondary | ICD-10-CM | POA: Diagnosis not present

## 2016-06-27 DIAGNOSIS — W19XXXA Unspecified fall, initial encounter: Secondary | ICD-10-CM | POA: Diagnosis not present

## 2016-06-27 DIAGNOSIS — M797 Fibromyalgia: Secondary | ICD-10-CM | POA: Diagnosis not present

## 2016-06-27 DIAGNOSIS — I2510 Atherosclerotic heart disease of native coronary artery without angina pectoris: Secondary | ICD-10-CM | POA: Diagnosis not present

## 2016-06-27 DIAGNOSIS — Z952 Presence of prosthetic heart valve: Secondary | ICD-10-CM | POA: Diagnosis not present

## 2016-06-27 DIAGNOSIS — M25531 Pain in right wrist: Secondary | ICD-10-CM | POA: Diagnosis not present

## 2016-06-27 DIAGNOSIS — E785 Hyperlipidemia, unspecified: Secondary | ICD-10-CM | POA: Diagnosis not present

## 2016-06-27 DIAGNOSIS — S0993XA Unspecified injury of face, initial encounter: Secondary | ICD-10-CM | POA: Diagnosis not present

## 2016-06-27 DIAGNOSIS — S0093XA Contusion of unspecified part of head, initial encounter: Secondary | ICD-10-CM | POA: Diagnosis not present

## 2016-06-27 DIAGNOSIS — E039 Hypothyroidism, unspecified: Secondary | ICD-10-CM | POA: Diagnosis not present

## 2016-06-27 DIAGNOSIS — L8992 Pressure ulcer of unspecified site, stage 2: Secondary | ICD-10-CM | POA: Diagnosis not present

## 2016-06-27 DIAGNOSIS — R079 Chest pain, unspecified: Secondary | ICD-10-CM | POA: Diagnosis not present

## 2016-06-27 DIAGNOSIS — I5042 Chronic combined systolic (congestive) and diastolic (congestive) heart failure: Secondary | ICD-10-CM | POA: Diagnosis not present

## 2016-06-27 DIAGNOSIS — I5043 Acute on chronic combined systolic (congestive) and diastolic (congestive) heart failure: Secondary | ICD-10-CM | POA: Diagnosis not present

## 2016-06-27 DIAGNOSIS — I429 Cardiomyopathy, unspecified: Secondary | ICD-10-CM | POA: Diagnosis not present

## 2016-06-27 DIAGNOSIS — J810 Acute pulmonary edema: Secondary | ICD-10-CM | POA: Diagnosis not present

## 2016-06-27 DIAGNOSIS — J9621 Acute and chronic respiratory failure with hypoxia: Secondary | ICD-10-CM | POA: Diagnosis not present

## 2016-06-27 DIAGNOSIS — I5032 Chronic diastolic (congestive) heart failure: Secondary | ICD-10-CM | POA: Diagnosis not present

## 2016-06-27 DIAGNOSIS — M542 Cervicalgia: Secondary | ICD-10-CM | POA: Diagnosis present

## 2016-06-27 DIAGNOSIS — M810 Age-related osteoporosis without current pathological fracture: Secondary | ICD-10-CM | POA: Diagnosis not present

## 2016-06-27 DIAGNOSIS — F0390 Unspecified dementia without behavioral disturbance: Secondary | ICD-10-CM | POA: Diagnosis present

## 2016-06-27 DIAGNOSIS — S098XXA Other specified injuries of head, initial encounter: Secondary | ICD-10-CM | POA: Diagnosis not present

## 2016-06-27 LAB — CBC
HCT: 32.1 % — ABNORMAL LOW (ref 36.0–46.0)
HEMOGLOBIN: 10.5 g/dL — AB (ref 12.0–15.0)
MCH: 30.8 pg (ref 26.0–34.0)
MCHC: 32.7 g/dL (ref 30.0–36.0)
MCV: 94.1 fL (ref 78.0–100.0)
Platelets: 193 10*3/uL (ref 150–400)
RBC: 3.41 MIL/uL — AB (ref 3.87–5.11)
RDW: 16.8 % — ABNORMAL HIGH (ref 11.5–15.5)
WBC: 7 10*3/uL (ref 4.0–10.5)

## 2016-06-27 LAB — BASIC METABOLIC PANEL
ANION GAP: 9 (ref 5–15)
BUN: 64 mg/dL — ABNORMAL HIGH (ref 6–20)
CHLORIDE: 104 mmol/L (ref 101–111)
CO2: 26 mmol/L (ref 22–32)
Calcium: 9.2 mg/dL (ref 8.9–10.3)
Creatinine, Ser: 2.95 mg/dL — ABNORMAL HIGH (ref 0.44–1.00)
GFR calc non Af Amer: 14 mL/min — ABNORMAL LOW (ref >60)
GFR, EST AFRICAN AMERICAN: 16 mL/min — AB (ref >60)
Glucose, Bld: 82 mg/dL (ref 65–99)
Potassium: 4.9 mmol/L (ref 3.5–5.1)
Sodium: 139 mmol/L (ref 135–145)

## 2016-06-27 LAB — GLUCOSE, CAPILLARY
Glucose-Capillary: 86 mg/dL (ref 65–99)
Glucose-Capillary: 106 mg/dL — ABNORMAL HIGH (ref 65–99)

## 2016-06-27 LAB — PROTIME-INR
INR: 2.53
Prothrombin Time: 27.7 seconds — ABNORMAL HIGH (ref 11.4–15.2)

## 2016-06-27 MED ORDER — WARFARIN SODIUM 2.5 MG PO TABS: 2.5000 mg | ORAL_TABLET | Freq: Once | ORAL | Status: DC

## 2016-06-27 MED ORDER — ISOSORBIDE MONONITRATE ER 30 MG PO TB24: 30.0000 mg | ORAL_TABLET | Freq: Every day | ORAL | Status: DC

## 2016-06-27 MED ORDER — PREGABALIN 50 MG PO CAPS: 50.0000 mg | ORAL_CAPSULE | Freq: Every day | ORAL | Status: DC

## 2016-06-27 NOTE — Progress Notes (Signed)
Patient's valuables left here with security. Called pt son to ask him to come pick up the items. He stated that he would come get these items at his convenience.

## 2016-06-27 NOTE — Progress Notes (Signed)
Progress Note  Patient Name: [name] Date of Encounter: 06/27/2016  Primary Cardiologist: Dr Excell Seltzer 01/2014 EP: Dr Ladona Ridgel  Patient Profile     83 y.o. female w/ hx NICM, chronic systolic heart failure, and severe AS s/p TAVR 12/21/2012 w/ 29 mm Edwards Sapien XT valve. Hx atrial fibrillation, HTN, chronic anticoagulation w/ warfarin. s/p PPM by Dr. Ladona Ridgel.Other hx includes, dementia, chronic pain, lives at [facility]. Admitted 02/23 with Heart failure.  Subjective   No chest pain, activity level is poor at baseline, uses a wheelchair with minimal ambulation Breathing better than on admit, not sure she is back to baseline. Coughing and wheezing more today.   Inpatient Medications    Scheduled Meds: . amiodarone  100 mg Oral Daily  . atorvastatin  10 mg Oral q1800  . calcitRIOL  0.25 mcg Oral q morning - 10a  . carvedilol  25 mg Oral BID  . cycloSPORINE  1 drop Both Eyes BID  . donepezil  10 mg Oral QHS  . DULoxetine  60 mg Oral QPM  . febuxostat  40 mg Oral QPM  . hydrALAZINE  25 mg Oral Q8H  . insulin aspart  0-15 Units Subcutaneous TID WC  . insulin aspart  0-5 Units Subcutaneous QHS  . isosorbide mononitrate  30 mg Oral Daily  . levothyroxine  75 mcg Oral QAC breakfast  . linaclotide  145 mcg Oral Daily  . loratadine  10 mg Oral Daily  . mouth rinse  15 mL Mouth Rinse BID  . memantine  5 mg Oral BID  . MUSCLE RUB   Topical BID  . pantoprazole  40 mg Oral Daily  . pregabalin  50 mg Oral QHS  . senna  2 tablet Oral BID  . warfarin  2.5 mg Oral ONCE-1800  . Warfarin - Pharmacist Dosing Inpatient   Does not apply q1800   Continuous Infusions:  PRN Meds: acetaminophen, bisacodyl, HYDROcodone-acetaminophen, ipratropium-albuterol, ondansetron (ZOFRAN) IV, simethicone   Vital Signs    Vitals:   06/26/16 1240 06/26/16 1500 06/26/16 2217 06/27/16 0635  BP: (!) 116/49 (!) 101/41 (!) 130/48 (!) 132/48  Pulse: 68 61  61  Resp: 18   17  Temp: 98.4 F (36.9  C)   98.2 F (36.8 C)  TempSrc: Oral   Oral  SpO2: 95%   95%  Weight:    176 lb 8 oz (80.1 kg)  Height:        Intake/Output Summary (Last 24 hours) at 06/27/16 1044 Last data filed at 06/27/16 0931  Gross per 24 hour  Intake          2440.83 ml  Output              950 ml  Net          1490.83 ml   Filed Weights   06/25/16 0442 06/26/16 0508 06/27/16 0635  Weight: 174 lb 1.6 oz (79 kg) 174 lb 12.8 oz (79.3 kg) 176 lb 8 oz (80.1 kg)    Telemetry    A pacing, AV pacing, occ PVCs - Personally Reviewed  ECG    n/a - Personally Reviewed  Physical Exam   General: Well developed, well nourished, female appearing in no acute distress. Head: Normocephalic, atraumatic.  Neck: Supple without bruits, JVD 8 cm. Lungs:  Resp regular and unlabored, bilateral rales. Coarser on the L, +upper airway wheeze Heart: RRR, S1, S2, no S3, S4, 2/6 murmur; no rub. Abdomen: Soft, non-tender, non-distended with  normoactive bowel sounds. No hepatomegaly. No rebound/guarding. No obvious abdominal masses. Extremities: No clubbing, cyanosis, no edema. Distal pedal pulses are 2+ bilaterally. Neuro: Alert and oriented X 3. Moves all extremities spontaneously. Psych: Normal affect.  Labs    Hematology  Recent Labs Lab 06/23/16 0843 06/24/16 0428 06/27/16 0522  WBC 8.9 7.2 7.0  RBC 3.64* 3.27* 3.41*  HGB 11.0* 10.1* 10.5*  HCT 34.0* 30.7* 32.1*  MCV 93.4 93.9 94.1  MCH 30.2 30.9 30.8  MCHC 32.4 32.9 32.7  RDW 16.2* 16.6* 16.8*  PLT 213 178 193    Chemistry  Recent Labs Lab 06/25/16 0534 06/26/16 0414 06/27/16 0522  NA 139 138 139  K 4.1 4.6 4.9  CL 102 100* 104  CO2 30 28 26   GLUCOSE 92 86 82  BUN 52* 71* 64*  CREATININE 2.64* 3.27* 2.95*  CALCIUM 10.0 9.8 9.2  GFRNONAA 16* 12* 14*  GFRAA 18* 14* 16*  ANIONGAP 7 10 9      Cardiac Enzymes  Recent Labs Lab 06/22/16 1414 06/22/16 1811 06/23/16 0049  TROPONINI 0.03* 0.03* <0.03   Lab Results  Component Value Date    INR 2.53 06/27/2016   INR 1.91 06/26/2016   INR 1.70 06/25/2016   BNP    Component Value Date/Time   BNP 528.3 (H) 06/13/2016 0530   DDimer   Recent Labs Lab 06/22/16 1414  DDIMER 0.36     Radiology   Dg Chest 2 View Result Date: 06/23/2016 CLINICAL DATA:  CHF, shortness of breath EXAM: CHEST  2 VIEW COMPARISON:  06/15/2016 FINDINGS: Right-sided duo lead pacemaker similar compared to prior. Valvular prosthesis. Mild increased atelectasis or infiltrate at the left lung base. Suspect tiny left pleural effusion. Stable enlarged cardiomediastinal without overt failure. Atherosclerosis. No pneumothorax. IMPRESSION: 1. Increasing atelectasis or infiltrate at the left base with possible tiny left effusion 2. Stable cardiomegaly Electronically Signed   By: Jasmine Pang M.D.   On: 06/23/2016 14:23   Nm Myocar Multi W/spect W/wall Motion / Ef Result Date: 06/16/2016 CLINICAL DATA:  Chest pain. EXAM: MYOCARDIAL IMAGING WITH SPECT (REST AND PHARMACOLOGIC-STRESS) GATED LEFT VENTRICULAR WALL MOTION STUDY LEFT VENTRICULAR EJECTION FRACTION TECHNIQUE: Standard myocardial SPECT imaging was performed after resting intravenous injection of 10 mCi Tc-36m tetrofosmin. Subsequently, intravenous infusion of Lexiscan was performed under the supervision of the Cardiology staff. At peak effect of the drug, 30 mCi Tc-81m tetrofosmin was injected intravenously and standard myocardial SPECT imaging was performed. Quantitative gated imaging was also performed to evaluate left ventricular wall motion, and estimate left ventricular ejection fraction. COMPARISON:  None. FINDINGS: Perfusion: Moderate to large fixed defect involving the inferior wall and part of the apex consistent with scar. No reversible defects to suggest ischemia. Wall Motion: Wall motion abnormality involving the inferior wall and apex. Moderate left ventricular dilatation. Left Ventricular Ejection Fraction: 42 % End diastolic volume 122 ml End systolic  volume 70 ml IMPRESSION: 1. Infarction/scar involving the inferior wall and part of the apex. No reversible defects to suggest ischemia. 2. Wall motion abnormality involving the inferior wall and part of the apex. Moderate left ventricular dilatation. 3. Left ventricular ejection fraction 42% 4. Non invasive risk stratification*: High risk. *2012 Appropriate Use Criteria for Coronary Revascularization Focused Update: J Am Coll Cardiol. 2012;59(9):857-881. http://content.dementiazones.com.aspx?articleid=[email or web address] Electronically Signed   By: Rudie Meyer M.D.   On: 06/16/2016 09:20   Dg Chest Port 1 View Result Date: 06/15/2016 CLINICAL DATA:  Patient admitted with respiratory distress 06/13/2016. EXAM: PORTABLE CHEST 1  VIEW COMPARISON:  Single-view of the chest 06/13/2016. CT chest 12/14/2014. PA and lateral chest 11/29/2014. FINDINGS: There is cardiomegaly. The patient is status post aortic valve repair. Pacing device is in place. Atherosclerosis is noted. Pulmonary edema seen on the most recent examination is markedly improved. No pneumothorax or pleural effusion. IMPRESSION: Markedly improved pulmonary edema. Cardiomegaly. Atherosclerosis. Electronically Signed   By: Drusilla Kanner M.D.   On: 06/15/2016 14:15   Dg Chest Port 1 View Result Date: 06/13/2016 CLINICAL DATA:  83 year old female with shortness of breath. EXAM: PORTABLE CHEST 1 VIEW COMPARISON:  Chest CT dated 12/14/2014 under radiograph dated 11/29/2014 FINDINGS: There is stable cardiomegaly. An aortic valve stent is noted. Central vascular prominence and diffuse interstitial densities noted likely mild vascular congestion and edema. Bilateral lower lung field hazy densities may represent atelectasis versus infiltrate. There is blunting of the left costophrenic angle which may represent a small pleural effusion. There is no pneumothorax. Right pectoral pacemaker device noted. No acute osseous pathology. IMPRESSION: 1. Cardiomegaly with  probable mild congestive changes and interstitial edema. 2. Bibasilar atelectasis versus infiltrate. 3. Probable small left pleural effusion. Electronically Signed   By: Elgie Collard M.D.   On: 06/13/2016 05:18     Cardiac Studies   ECHO: 06/13/2016 - Left ventricle: LVEF is approximately 35 to 40% with hypokinesis   of inferior, inferoseptal and anterior walls This is new compared   to echo of 2016. The cavity size was moderately dilated. Wall   thickness was normal. Features are consistent with a pseudonormal   left ventricular filling pattern, with concomitant abnormal   relaxation and increased filling pressure (grade 2 diastolic   dysfunction). Doppler parameters are consistent with high   ventricular filling pressure. - Aortic valve: AV prosthesis is difficult to see Peak and mean   gradients through the valve are 12 and 7 mm Hg respectively.   There is trace valvular AI Valve area (VTI): 1.82 cm^2. Valve   area (Vmax): 1.7 cm^2. Valve area (Vmean): 1.89 cm^2. - Mitral valve: There was mild to moderate regurgitation. - Left atrium: The atrium was mildly dilated. - Pulmonary arteries: PA peak pressure: 39 mm Hg (S).  Myoview: 06/22/2016 IMPRESSION: 1. Infarction/scar involving the inferior wall and part of the apex. No reversible defects to suggest ischemia. 2. Wall motion abnormality involving the inferior wall and part of the apex. Moderate left ventricular dilatation. 3. Left ventricular ejection fraction 42% 4. Non invasive risk stratification*: High risk.  Patient Profile     83 y.o. female w/ hx  NICM, chronic systolic heart failure, and severe AS s/p TAVR 12/21/2012 w/ 29 mm Edwards Sapien XT valve. Hx atrial fibrillation, HTN, chronic anticoagulation w/ warfarin. s/p PPM by Dr. Ladona Ridgel.Other hx includes, dementia, chronic pain, lives at [facility]. Admitted 02/23 with Heart failure. MV instead of cath due to RI  Assessment & Plan    Acute respiratory failure  Improved after Lasix 80 mg every 12 hours x 4 doses- held since 02/24. Torsemide 60 mg 03/07 and 03/08, now on hold.  -I/O net negative 8.1 L since admit - wt down to 176 from 185.  Up a couple of lbs due to hydration, but was felt too dry. - management of infiltrate per IM  Chest pain w/Non obstructive disease in 2014.  - Nuc study 03/04 w/ Lg scar, no ischemia.   - nuc instead of cath initially due to ARI - she is currently asymptomatic - coumadin was on hold but restarted  since no cath planned  PAFmaintaining SR CHA2DS2VASc score of 8, - chronic coumadin -  INR 2.53 today Pharmacy following  - Coumadin restarted 03/05  Hx of NICM now returned with EF 35-40% +WMA -  nuc study results above, no cath, due to elevated Cr and other comorbidities.  - Ez neg and prev cath no sig dz, LVD again felt 2nd NICM  Hx of [id number]. see Echo  CKD 4  - Cr 1.85 on admit increased w/ diuresis - peak 3.27 on 03/08, now 2.95 -  lasix on hold  HTN - Hydralazine 25 mg tid, Coreg 25 mg bid and Imdur 30 mg qd - SBP generally <130  Otherwise, per IM Principal Problem:   Acute respiratory failure (HCC) Active Problems:   Paroxysmal atrial fibrillation (HCC)   Hypertension   Acute on chronic systolic and diastolic heart failure, NYHA class 3 (HCC)   Dementia without behavioral disturbance   CKD (chronic kidney disease) stage 4, GFR 15-29 ml/min (HCC)   Elevated blood sugar   Acute on chronic combined systolic and diastolic congestive heart failure (HCC)   Unstable angina (HCC)   Chest pain at rest  Signed, Theodore Demark , PA-C 10:44 AM 06/27/2016 Pager: [phone]  Pt DC'd prior to being seen by me today. Outpatient cardiology FU is arranged on 3/20. I have discussed her case with Dr Jomarie Longs.   No charge rendered today as patient not seen.  Tonny Bollman 06/27/2016 4:48 PM

## 2016-06-27 NOTE — Progress Notes (Signed)
ANTICOAGULATION CONSULT NOTE - Follow Up Consult  Pharmacy Consult for Warfarin Indication: atrial fibrillation  Assessment: CC/HPI: respiratory distress  PMH: OSA, CVA, sciatica, CKD, AS, asthma, afib, CAD, dementia, CHF, fibromyalgia, HTN, HLD, neuropathy, hypothyroid, PE  Anticoag: Warfarin for hx afib, s/p hep sq until INR > 2  INR today 2.53  - PTA dose 1.5mg  daily (INR 1.68 on admit date 2/23, last taken 2/21  Renal: CKD stage IV (baseline SCr ~2): SCr 2.95  Heme/Onc: H&H 10.5/32.1, Plt 193  Goal of Therapy:  INR 2-3 Monitor platelets by anticoagulation protocol: Yes   Plan:  Warfarin 2.5 mg x 1 Daily INR, CBC Monitor s/sx of bleeding  DC sq hep   For discharge, consider increasing coumadin dose to 3mg  daily and close monitoring of INR after discharge.  Recommend check INR within 2 days post discharge.     Allergies  Allergen Reactions  . Nubain [Nalbuphine Hcl] Hives    Went into cardiac arrest   . Codeine Hives and Nausea Only  . Darvocet [Propoxyphene N-Acetaminophen] Nausea Only    Patient Measurements: Height: 4\' 9"  (144.8 cm) Weight: 176 lb 8 oz (80.1 kg) (Scale C) IBW/kg (Calculated) : 38.6  Vital Signs: Temp: 98.2 F (36.8 C) (03/09 0635) Temp Source: Oral (03/09 0635) BP: 132/48 (03/09 0635) Pulse Rate: 61 (03/09 0635)  Labs:  Recent Labs  06/25/16 0534 06/26/16 0414 06/27/16 0522  HGB  --   --  10.5*  HCT  --   --  32.1*  PLT  --   --  193  LABPROT 20.2* 22.2* 27.7*  INR 1.70 1.91 2.53  CREATININE 2.64* 3.27* 2.95*    Estimated Creatinine Clearance: 12.8 mL/min (by C-G formula based on SCr of 2.95 mg/dL (H)).  Isaac Bliss, PharmD, BCPS, BCCCP Clinical Pharmacist Clinical phone for 06/27/2016 from 7a-3:30p: [phone] If after 3:30p, please call main pharmacy at: [phone] 06/27/2016 10:13 AM

## 2016-06-27 NOTE — Progress Notes (Signed)
Patient prepared for transport.  Report called to [facility].

## 2016-06-27 NOTE — Clinical Social Work Note (Signed)
CSW facilitated patient discharge including contacting patient family and facility to confirm patient discharge plans. Clinical information faxed to facility and family agreeable with plan. CSW arranged ambulance transport via [facility] to [facility] at 2:00 pm. RN to call report prior to discharge ([phone]).  CSW will sign off for now as social work intervention is no longer needed. Please consult us again if new needs arise.  Kindrick Lankford, CSW [phone]  

## 2016-06-27 NOTE — Progress Notes (Signed)
Physical Therapy Treatment Patient Details Name: [name] MRN: [id number] DOB: 1932/10/01 Today's Date: 06/27/2016    History of Present Illness [name] is a 83 y.o. female with PMHx: diastolic HF, CKD stage IV, aortic stenosis, dementia, chronic pain, HTN, paroxysmal A-fib, right patella tendon repair, remote lt patellar tendon repair. Admitted with  respiratory distress with xray revealing mild CHF.    PT Comments    Pt declining to ambulate due to pain and just having had pain meds.  Pt was agreeable for LE therex and short gait to [facility].  Con't to recommend SNF.   Follow Up Recommendations  SNF     Equipment Recommendations  None recommended by PT    Recommendations for Other Services       Precautions / Restrictions Precautions Precautions: Fall Precaution Comments: Decreased use of right shoulder Restrictions Weight Bearing Restrictions: No    Mobility  Bed Mobility               General bed mobility comments: up in recliner upon arrival  Transfers Overall transfer level: Needs assistance Equipment used: Rolling walker (2 wheeled) Transfers: Sit to/from Stand Sit to Stand: Min guard         General transfer comment: good use of hand placement  Ambulation/Gait Ambulation/Gait assistance: Min guard Ambulation Distance (Feet): 5 Feet (x2) Assistive device: Rolling walker (2 wheeled) Gait Pattern/deviations: Step-through pattern;Trunk flexed Gait velocity: decreased   General Gait Details: Pt declined ambulation due to pain, but did ambulate to [facility] in room   Stairs            Wheelchair Mobility    Modified Rankin (Stroke Patients Only)       Balance           Standing balance support: Bilateral upper extremity supported Standing balance-Leahy Scale: Poor Standing balance comment: needs external support and/or RW                    Cognition Arousal/Alertness: Awake/alert Behavior During Therapy: WFL for  tasks assessed/performed Overall Cognitive Status: Within Functional Limits for tasks assessed                      Exercises General Exercises - Lower Extremity Ankle Circles/Pumps: AROM;Both;10 reps;Seated Long Arc Quad: Strengthening;10 reps;Both;Seated Hip ABduction/ADduction: AROM;Both;10 reps;Seated Hip Flexion/Marching: AROM;Strengthening;Both;10 reps;Seated    General Comments        Pertinent Vitals/Pain Pain Assessment: 0-10 Pain Score: 7  Pain Location: legs, back, "all over" Pain Descriptors / Indicators: Aching;Grimacing;Sore Pain Intervention(s): Premedicated before session;Limited activity within patient's tolerance    Home Living                      Prior Function            PT Goals (current goals can now be found in the care plan section) Acute Rehab PT Goals PT Goal Formulation: With patient Time For Goal Achievement: 06/30/16 Potential to Achieve Goals: Good Progress towards PT goals: Progressing toward goals    Frequency    Min 3X/week      PT Plan Current plan remains appropriate    Co-evaluation             End of Session   Activity Tolerance: Patient tolerated treatment well;Patient limited by pain Patient left: in chair;with call bell/phone within reach;with chair alarm set   PT Visit Diagnosis: Muscle weakness (generalized) (M62.81)  Time: 1696-7893 PT Time Calculation (min) (ACUTE ONLY): 14 min  Charges:  $Therapeutic Exercise: 8-22 mins                    G Codes:       Yerania Chamorro LUBECK 06/27/2016, 12:02 PM

## 2016-06-27 NOTE — Discharge Summary (Signed)
Physician Discharge Summary  [name] ZOX:[id number] DOB: 11-08-1932 DOA: 06/13/2016  PCP: Oneal Grout, MD  Admit date: 06/13/2016 Discharge date: 06/27/2016  Time spent: 45 minutes  Recommendations for Outpatient Follow-up:  1. PCP Dr.Pandey in 3days, please check Bmet and resume Torsemide probably at 20mg  BID as long as creatinine stable, weight at discharge 176lbs 2. Dr.Cooper Cards, 3/20 at 11:30am  Discharge Diagnoses:  Principal Problem:   Acute respiratory failure (HCC)   Acute on chronic systolic and diastolic heart failure, NYHA class 3 (HCC)   Suspected CAD   Paroxysmal atrial fibrillation (HCC)   Hypertension    Dementia without behavioral disturbance   AKI on CKD (chronic kidney disease) stage 4, GFR 15-29 ml/min (HCC)   Elevated blood sugar   Acute on chronic combined systolic and diastolic congestive heart failure (HCC)   Unstable angina (HCC)   Chest pain at rest   Peripheral neuropathy  Discharge Condition: stable  Diet recommendation: low sodium, heart healthy  Filed Weights   06/25/16 0442 06/26/16 0508 06/27/16 0635  Weight: 79 kg (174 lb 1.6 oz) 79.3 kg (174 lb 12.8 oz) 80.1 kg (176 lb 8 oz)    History of present illness:  83 year old female with past medical history significant for chronic systolic and diastolic CHF, chronic kidney disease stage IV, aortic stenosis status post TAVR, history of dementia, paroxysmal atrial fibrillation on Coumadin patient lives in [facility] skilled nursing facility. She was brought 06/13/2016 to [facility] for evaluation of acute respiratory distress. Patient was found to have acute combined systolic and diastolic congestive heart failure  Hospital Course:  Acute on chronic systolic and diastolic heart failure, NYHA class 3 (HCC) - Echo on this admission showedejection fraction 35-40% with inferior, inferior septal and anterior walls hypokinesis which is new compared to echo in 2016, grade 2 diastolic dysfunction,  Myoview showed large scar and no inducible ischemia, did not proceed with cath due to CKD 3-4 -improved with diuresis, medical mgt, followed by Cardiology throughout this admission, diuresed with IV lasix, negative 9.3L, thenswitched to torsemide 100 mg daily. Torsemide then placed on hold 3/3 due to worsening renal function  - Torsemide restarted at 60mg  daily on 3/7, but creatinine bumped to 3.2 yesterday hence stopped torsemide, gently hydrated with NS at 50cc/hr yesterday after discussion with Dr.Cooper -Volume status stable today, creatinine today 2.9 and she will be discharged to SNF today with plan for resumption of torsemide in 3days and close monitoring of Bmet and close Cards FU too    Chest pain - No obstructive disease on cath in2014 - Stress test on this admission showed infarction involving the inferior wall and part of the apex with no reversible defects to suggest ischemia, ejection fraction 42%, high risk study - seen by Cardiology  no cath due to CKD, medical management only, continue coreg and statin, no ASA since shes on warfarin    Paroxysmal atrial fibrillation (HCC) - CHADS vasc score 6 - Continue rate controlled with amiodarone 100 mg daily and Coreg 25 mg BID - Continue Coumadin, INR 2.5 today    Essential hypertension - Continue Coreg 25 mg BID,  - continue hydralazine and imdur    Dementia without behavioral disturbance - Continue Namenda 5 mg twice daily, Aricept 10 mg at bedtime, Cymbalta 60 mg at bedtime - Stable mental status   Hx of [id number]. see Echo - Stable based on recent ECHO  Hypothyroidism - Continue Synthroid 75 g daily  Dyslipidemia - Continue Lipitor 10 mg  at bedtime  Anemia of chronic kidney disease  - Hemoglobin stable at 10  Depression  - Continue Cymbalta. Lyrica -dose lowered due to renal insufficiency  Consultants:   Cardiology  Nephrology  Procedures:   Echo 06/13/2016 showed ejection fraction  35-40% with inferior hypokinesis, inferior septal and anterior walls hypokinesis which is new compared to echo in 2016; grade 2 diastolic dysfunction  Myoview: IMPRESSION: 1. Infarction/scar involving the inferior wall and part of the apex. No reversible defects to suggest ischemia.  2. Wall motion abnormality involving the inferior wall and part of the apex. Moderate left ventricular dilatation.  3. Left ventricular ejection fraction 42%  4. Non invasive risk stratification*: High risk  Discharge Exam: Vitals:   06/26/16 2217 06/27/16 0635  BP: (!) 130/48 (!) 132/48  Pulse:  61  Resp:  17  Temp:  98.2 F (36.8 C)    General: AAOx2 Cardiovascular: S1S2/RRR Respiratory: CTAB  Discharge Instructions   Discharge Instructions    Diet - low sodium heart healthy    Complete by:  As directed    Diet - low sodium heart healthy    Complete by:  As directed    Increase activity slowly    Complete by:  As directed    Increase activity slowly    Complete by:  As directed      Current Discharge Medication List    START taking these medications   Details  isosorbide mononitrate (IMDUR) 30 MG 24 hr tablet Take 1 tablet (30 mg total) by mouth daily.      CONTINUE these medications which have CHANGED   Details  carvedilol (COREG) 12.5 MG tablet Take 2 tablets (25 mg total) by mouth 2 (two) times daily.   Associated Diagnoses: Essential hypertension    pregabalin (LYRICA) 50 MG capsule Take 1 capsule (50 mg total) by mouth at bedtime. 6AM, 2PM, 10PM      CONTINUE these medications which have NOT CHANGED   Details  alendronate (FOSAMAX) 70 MG/75ML solution Take 70 mg by mouth every 7 (seven) days. Take with a full glass of water on an empty stomach on Saturday    amiodarone (PACERONE) 100 MG tablet Take 100 mg by mouth daily.    atorvastatin (LIPITOR) 10 MG tablet Take 10 mg by mouth daily at 6 PM.     BIOTIN 5000 PO Take 5,000 mcg by mouth every evening.      bisacodyl (DULCOLAX) 10 MG suppository Place 10 mg rectally daily as needed for moderate constipation.    calcitRIOL (ROCALTROL) 0.25 MCG capsule Take 0.25 mcg by mouth every morning.     cetirizine (ZYRTEC) 10 MG tablet Take 10 mg by mouth daily.     Cholecalciferol (VITAMIN D-3) 1000 units CAPS Take 2 capsules by mouth. Take 2 capsules to = 2000 units PO QHS    cycloSPORINE (RESTASIS) 0.05 % ophthalmic emulsion Place 1 drop into both eyes 2 (two) times daily.     donepezil (ARICEPT) 10 MG tablet Take 10 mg by mouth at bedtime.     DULoxetine (CYMBALTA) 60 MG capsule Take 60 mg by mouth every evening.     esomeprazole (NEXIUM) 40 MG capsule Take 40 mg by mouth daily.     ferrous sulfate 325 (65 FE) MG tablet Take 325 mg by mouth daily.     hydrALAZINE (APRESOLINE) 25 MG tablet Take 25 mg by mouth 3 (three) times daily.    HYDROcodone-acetaminophen (NORCO) 7.5-325 MG tablet Take 1-2 tablets by mouth  every 4 (four) hours as needed for moderate pain.    ipratropium-albuterol (DUONEB) 0.5-2.5 (3) MG/3ML SOLN Take 3 mLs by nebulization every 6 (six) hours as needed (wheezing/shortness of breath).    levothyroxine (SYNTHROID, LEVOTHROID) 112 MCG tablet Take 112 mcg by mouth daily.     Linaclotide (LINZESS) 145 MCG CAPS capsule Take 145 mcg by mouth daily.     memantine (NAMENDA) 5 MG tablet Take 5 mg by mouth 2 (two) times daily.     Menthol (ICY HOT ADVANCED RELIEF) 7.5 % PTCH Apply 3 patches topically daily. Apply 1 patch to each knee and one patch across lower back.  Apply at 10AM and remove at 10PM    Menthol, Topical Analgesic, (BIOFREEZE) 4 % GEL Apply 1 application topically 2 (two) times daily. Apply to back of neck, lower back, and right leg    Multiple Vitamin (MULTIVITAMIN) tablet Take 1 tablet by mouth daily.     nitroGLYCERIN (NITROSTAT) 0.4 MG SL tablet Place 0.4 mg under the tongue every 5 (five) minutes as needed for chest pain (MAX 3 TABLETS).     polyethylene  glycol (MIRALAX / GLYCOLAX) packet Take 17 g by mouth 2 (two) times daily. Qty: 14 each, Refills: 0    pramoxine (SARNA SENSITIVE) 1 % LOTN Apply 1 application topically daily as needed (itching).     senna (SENOKOT) 8.6 MG TABS tablet Take 2 tablets by mouth 2 (two) times daily.    simethicone (MYLICON) 80 MG chewable tablet Chew 80 mg by mouth every 12 (twelve) hours as needed for flatulence.     Turmeric 500 MG CAPS Take 1 capsule by mouth 2 (two) times daily.    ULORIC 40 MG tablet Take 40 mg by mouth every evening.     vitamin B-12 (CYANOCOBALAMIN) 1000 MCG tablet Take 1,000 mcg by mouth 2 (two) times a week. Tuesday and Friday    warfarin (COUMADIN) 3 MG tablet Take 1.5 mg by mouth daily. Take 1/2 of a 3 mg tablet to = 1.5 mg qd      STOP taking these medications     torsemide (DEMADEX) 20 MG tablet        Allergies  Allergen Reactions  . Nubain [Nalbuphine Hcl] Hives    Went into cardiac arrest   . Codeine Hives and Nausea Only  . Darvocet [Propoxyphene N-Acetaminophen] Nausea Only    Contact information for follow-up providers    PANDEY, MAHIMA, MD. Schedule an appointment as soon as possible for a visit in 1 week(s).   Specialty:  Internal Medicine Contact information: [address] [place] [address] [phone]        Tonny Bollman, MD. Schedule an appointment as soon as possible for a visit on 07/08/2016.   Specialty:  Cardiology Why:  at 11:30 Pm with Dr. Earmon Phoenix PA, [facility] Contact information: [address] [place] [address] [phone]            Contact information for after-discharge care    Destination    [facility] Follow up.   Specialty:  Skilled Nursing Facility Contact information: [address] [place] [address] [phone]                   The results of significant diagnostics from this hospitalization (including imaging, microbiology, ancillary and laboratory) are  listed below for reference.    Significant Diagnostic Studies: Dg Chest 2 View  Result Date: 06/25/2016 CLINICAL DATA:  CHF  EXAM: CHEST  2 VIEW COMPARISON:  06/23/2016 FINDINGS: Heart remains moderately enlarged. Postoperative changes from aortic valvular repair are noted. Double lead right subclavian pacemaker device and leads are stable and intact. Normal pulmonary vascularity. Low volumes. Subsegmental atelectasis at the left base is unchanged. No pneumothorax. Right lower lateral rib deformity is stable. IMPRESSION: No evidence of CHF. Subsegmental atelectasis at the left base. Electronically Signed   By: Jolaine Click M.D.   On: 06/25/2016 07:54   Dg Chest 2 View  Result Date: 06/23/2016 CLINICAL DATA:  CHF, shortness of breath EXAM: CHEST  2 VIEW COMPARISON:  06/15/2016 FINDINGS: Right-sided duo lead pacemaker similar compared to prior. Valvular prosthesis. Mild increased atelectasis or infiltrate at the left lung base. Suspect tiny left pleural effusion. Stable enlarged cardiomediastinal without overt failure. Atherosclerosis. No pneumothorax. IMPRESSION: 1. Increasing atelectasis or infiltrate at the left base with possible tiny left effusion 2. Stable cardiomegaly Electronically Signed   By: Jasmine Pang M.D.   On: 06/23/2016 14:23   Nm Myocar Multi W/spect W/wall Motion / Ef  Result Date: 06/16/2016 CLINICAL DATA:  Chest pain. EXAM: MYOCARDIAL IMAGING WITH SPECT (REST AND PHARMACOLOGIC-STRESS) GATED LEFT VENTRICULAR WALL MOTION STUDY LEFT VENTRICULAR EJECTION FRACTION TECHNIQUE: Standard myocardial SPECT imaging was performed after resting intravenous injection of 10 mCi Tc-67m tetrofosmin. Subsequently, intravenous infusion of Lexiscan was performed under the supervision of the Cardiology staff. At peak effect of the drug, 30 mCi Tc-72m tetrofosmin was injected intravenously and standard myocardial SPECT imaging was performed. Quantitative gated imaging was also performed to evaluate left  ventricular wall motion, and estimate left ventricular ejection fraction. COMPARISON:  None. FINDINGS: Perfusion: Moderate to large fixed defect involving the inferior wall and part of the apex consistent with scar. No reversible defects to suggest ischemia. Wall Motion: Wall motion abnormality involving the inferior wall and apex. Moderate left ventricular dilatation. Left Ventricular Ejection Fraction: 42 % End diastolic volume 122 ml End systolic volume 70 ml IMPRESSION: 1. Infarction/scar involving the inferior wall and part of the apex. No reversible defects to suggest ischemia. 2. Wall motion abnormality involving the inferior wall and part of the apex. Moderate left ventricular dilatation. 3. Left ventricular ejection fraction 42% 4. Non invasive risk stratification*: High risk. *2012 Appropriate Use Criteria for Coronary Revascularization Focused Update: J Am Coll Cardiol. 2012;59(9):857-881. http://content.dementiazones.com.aspx?articleid=[email or web address] Electronically Signed   By: Rudie Meyer M.D.   On: 06/16/2016 09:20   Dg Chest Port 1 View  Result Date: 06/15/2016 CLINICAL DATA:  Patient admitted with respiratory distress 06/13/2016. EXAM: PORTABLE CHEST 1 VIEW COMPARISON:  Single-view of the chest 06/13/2016. CT chest 12/14/2014. PA and lateral chest 11/29/2014. FINDINGS: There is cardiomegaly. The patient is status post aortic valve repair. Pacing device is in place. Atherosclerosis is noted. Pulmonary edema seen on the most recent examination is markedly improved. No pneumothorax or pleural effusion. IMPRESSION: Markedly improved pulmonary edema. Cardiomegaly. Atherosclerosis. Electronically Signed   By: Drusilla Kanner M.D.   On: 06/15/2016 14:15   Dg Chest Port 1 View  Result Date: 06/13/2016 CLINICAL DATA:  83 year old female with shortness of breath. EXAM: PORTABLE CHEST 1 VIEW COMPARISON:  Chest CT dated 12/14/2014 under radiograph dated 11/29/2014 FINDINGS: There is stable cardiomegaly.  An aortic valve stent is noted. Central vascular prominence and diffuse interstitial densities noted likely mild vascular congestion and edema. Bilateral lower lung field hazy densities may represent atelectasis versus infiltrate. There is blunting of the left costophrenic angle which may represent a small pleural effusion. There is  no pneumothorax. Right pectoral pacemaker device noted. No acute osseous pathology. IMPRESSION: 1. Cardiomegaly with probable mild congestive changes and interstitial edema. 2. Bibasilar atelectasis versus infiltrate. 3. Probable small left pleural effusion. Electronically Signed   By: Elgie Collard M.D.   On: 06/13/2016 05:18    Microbiology: No results found for this or any previous visit (from the past 240 hour(s)).   Labs: Basic Metabolic Panel:  Recent Labs Lab 06/23/16 0843 06/24/16 0428 06/25/16 0534 06/26/16 0414 06/27/16 0522  NA 137 137 139 138 139  K 4.8 4.8 4.1 4.6 4.9  CL 103 104 102 100* 104  CO2 27 24 30 28 26   GLUCOSE 112* 88 92 86 82  BUN 62* 56* 52* 71* 64*  CREATININE 2.56* 2.35* 2.64* 3.27* 2.95*  CALCIUM 9.3 9.7 10.0 9.8 9.2   Liver Function Tests: No results for input(s): AST, ALT, ALKPHOS, BILITOT, PROT, ALBUMIN in the last 168 hours. No results for input(s): LIPASE, AMYLASE in the last 168 hours. No results for input(s): AMMONIA in the last 168 hours. CBC:  Recent Labs Lab 06/21/16 0544 06/22/16 0627 06/23/16 0843 06/24/16 0428 06/27/16 0522  WBC 8.9 8.1 8.9 7.2 7.0  HGB 10.6* 10.0* 11.0* 10.1* 10.5*  HCT 32.8* 30.0* 34.0* 30.7* 32.1*  MCV 92.9 92.6 93.4 93.9 94.1  PLT 195 183 213 178 193   Cardiac Enzymes:  Recent Labs Lab 06/22/16 1414 06/22/16 1811 06/23/16 0049  TROPONINI 0.03* 0.03* <0.03   BNP: BNP (last 3 results)  Recent Labs  06/13/16 0530  BNP 528.3*    ProBNP (last 3 results) No results for input(s): PROBNP in the last 8760 hours.  CBG:  Recent Labs Lab 06/26/16 0740 06/26/16 1233  06/26/16 1655 06/26/16 2042 06/27/16 0744  GLUCAP 91 116* 150* 136* 86       Signed:  Kenidi Elenbaas MD.  [facility] 06/27/2016, 11:07 AM

## 2016-06-30 ENCOUNTER — Non-Acute Institutional Stay (SKILLED_NURSING_FACILITY): Payer: Medicare Other | Admitting: Adult Health

## 2016-06-30 ENCOUNTER — Encounter: Payer: Self-pay | Admitting: Adult Health

## 2016-06-30 DIAGNOSIS — R531 Weakness: Secondary | ICD-10-CM | POA: Diagnosis not present

## 2016-06-30 DIAGNOSIS — I10 Essential (primary) hypertension: Secondary | ICD-10-CM | POA: Diagnosis not present

## 2016-06-30 DIAGNOSIS — I480 Paroxysmal atrial fibrillation: Secondary | ICD-10-CM | POA: Diagnosis not present

## 2016-06-30 DIAGNOSIS — M1A9XX0 Chronic gout, unspecified, without tophus (tophi): Secondary | ICD-10-CM

## 2016-06-30 DIAGNOSIS — I25119 Atherosclerotic heart disease of native coronary artery with unspecified angina pectoris: Secondary | ICD-10-CM

## 2016-06-30 DIAGNOSIS — M792 Neuralgia and neuritis, unspecified: Secondary | ICD-10-CM

## 2016-06-30 DIAGNOSIS — K219 Gastro-esophageal reflux disease without esophagitis: Secondary | ICD-10-CM

## 2016-06-30 DIAGNOSIS — D638 Anemia in other chronic diseases classified elsewhere: Secondary | ICD-10-CM | POA: Diagnosis not present

## 2016-06-30 DIAGNOSIS — E785 Hyperlipidemia, unspecified: Secondary | ICD-10-CM | POA: Diagnosis not present

## 2016-06-30 DIAGNOSIS — F0390 Unspecified dementia without behavioral disturbance: Secondary | ICD-10-CM

## 2016-06-30 DIAGNOSIS — E039 Hypothyroidism, unspecified: Secondary | ICD-10-CM

## 2016-06-30 DIAGNOSIS — I5042 Chronic combined systolic (congestive) and diastolic (congestive) heart failure: Secondary | ICD-10-CM

## 2016-06-30 DIAGNOSIS — N184 Chronic kidney disease, stage 4 (severe): Secondary | ICD-10-CM

## 2016-06-30 DIAGNOSIS — K5909 Other constipation: Secondary | ICD-10-CM | POA: Diagnosis not present

## 2016-06-30 DIAGNOSIS — M810 Age-related osteoporosis without current pathological fracture: Secondary | ICD-10-CM | POA: Diagnosis not present

## 2016-07-01 ENCOUNTER — Encounter: Payer: Self-pay | Admitting: Internal Medicine

## 2016-07-01 ENCOUNTER — Non-Acute Institutional Stay (SKILLED_NURSING_FACILITY): Payer: Medicare Other | Admitting: Internal Medicine

## 2016-07-01 ENCOUNTER — Telehealth: Payer: Self-pay

## 2016-07-01 DIAGNOSIS — I5042 Chronic combined systolic (congestive) and diastolic (congestive) heart failure: Secondary | ICD-10-CM

## 2016-07-01 DIAGNOSIS — R791 Abnormal coagulation profile: Secondary | ICD-10-CM

## 2016-07-01 DIAGNOSIS — I480 Paroxysmal atrial fibrillation: Secondary | ICD-10-CM

## 2016-07-01 DIAGNOSIS — D638 Anemia in other chronic diseases classified elsewhere: Secondary | ICD-10-CM | POA: Diagnosis not present

## 2016-07-01 DIAGNOSIS — R5381 Other malaise: Secondary | ICD-10-CM

## 2016-07-01 DIAGNOSIS — E039 Hypothyroidism, unspecified: Secondary | ICD-10-CM

## 2016-07-01 DIAGNOSIS — N184 Chronic kidney disease, stage 4 (severe): Secondary | ICD-10-CM

## 2016-07-01 DIAGNOSIS — K219 Gastro-esophageal reflux disease without esophagitis: Secondary | ICD-10-CM | POA: Diagnosis not present

## 2016-07-01 NOTE — Telephone Encounter (Signed)
This is a patient of [facility], who was admitted to [facility] after hospitalization. [facility] - Hospital F/U is needed. Hospital discharge from [facility] on 06/27/16. May have already been completed.

## 2016-07-01 NOTE — Progress Notes (Signed)
LOCATION: [facility]  PCP: Oneal Grout, MD   Code Status: Full Code  Goals of care: Advanced Directive information Advanced Directives 06/13/2016  Does Patient Have a Medical Advance Directive? No  Type of Advance Directive -  Does patient want to make changes to medical advance directive? -  Copy of Healthcare Power of Attorney in Chart? -  Would patient like information on creating a medical advance directive? No - Patient declined  Pre-existing out of facility DNR order (yellow form or pink MOST form) -       Extended Emergency Contact Information Primary Emergency Contact: Wintermute,David Address: [address]          [place], [place] [address] [place] Home Phone: [phone] Mobile Phone: [phone] Relation: Son Secondary Emergency Contact: Gray,Sharon Address: [address]          [place], [place] [address] [place] Home Phone: [phone] Mobile Phone: [phone] Relation: Daughter   Allergies  Allergen Reactions  . Nubain [Nalbuphine Hcl] Hives    Went into cardiac arrest   . Codeine Hives and Nausea Only  . Darvocet [Propoxyphene N-Acetaminophen] Nausea Only    Chief Complaint  Patient presents with  . Readmit To SNF    Readmission Visit      HPI:  Patient is a 83 y.o. female seen today for long term care post hospital readmission from 06/13/16-06/27/16 with acute respiratory failure from chf exacerbation and acute on chronic renal failure.  Echocardiogram showed EF 35-40% with inferior, infero-septal and anterior wall hypokinesis which was new compared to echocardiogram in 2016. Cardiology was consulted. No catheterization was done given her ckd and medical management was recommended. She was diuresed with iv lasix and later switched to torsemide. Her torsemide is on hold for now with worsening renal function. She has PMH of chronic systolic chf, severe AS s/p TAVR, afib, HTN, s/p PPM, dementia among  others. She is seen in her room today.   Review of Systems:  Constitutional: Negative for fever, chills, diaphoresis.  HENT: Negative for headache, congestion,sore throat. Positive for nasal discharge and difficulty swallowing.   Eyes: Negative for blurred vision, double vision and discharge. Wears glasses. Respiratory: Negative for shortness of breath and wheezing. Positive for occasional cough. Her breathing has improved. Cardiovascular: Negative for palpitations, leg swelling. Positive for occasional chest pains.  Gastrointestinal: Negative for vomiting, abdominal pain, loss of appetite, melena, diarrhea and constipation. Positive for heartburn and nausea. Last bowel movement was yesterday. Genitourinary: Negative for dysuria and flank pain.  Musculoskeletal: Negative for back pain, fall in the facility.  Skin: Negative for itching, rash.  Neurological: Negative for dizziness. Psychiatric/Behavioral: Negative for depression   Past Medical History:  Diagnosis Date  . Acute on chronic renal failure [facility])    sees Dr Allena Katz   . Anemia    Acute blood loss  . Aortic regurgitation   . Aortic stenosis 10/13/2012   Low EF, low gradient with severe aortic stenosis confirmed by dobutamine stress echocardiogram s/p TAVR 12/2012  . Asthma   . Atrial fibrillation (HCC)    tachy-brady syndrome with <1% recurrent PAF since pacemaker placement  . Cataracts, bilateral   . Chronic combined systolic and diastolic CHF (congestive heart failure) (HCC)   . Chronic lower back pain   . CKD (chronic kidney disease)   . Coronary artery disease involving native coronary artery of native heart   . Dementia    Without behavioral disturbance  . Dysrhythmia   .  Fibromyalgia   . Gastroesophageal reflux disease   . H/O dizziness   . H/O urinary frequency   . H/O: stroke   . Hard of hearing   . Headache    hx of migraines   . Heart murmur   . History of blood transfusion   . History of bronchitis   .  History of kidney stones   . History of urinary tract infection   . HLD (hyperlipidemia)   . HTN (hypertension)   . Hypothyroidism   . Neuropathy (HCC)   . Nonischemic cardiomyopathy (HCC)   . On home oxygen therapy    patient uses at nite- 2L- has not used in > 6 months per patient  . Osteoarthritis   . Osteoporosis   . Pneumonia    hx of x 3   . PONV (postoperative nausea and vomiting)   . Presence of permanent cardiac pacemaker   . Pulmonary embolism (HCC)    HISTORY OF, the pt. had a recurrent bilateral pulmonary emboli in 2005, on warfarin therapy and at which time she under went implantation of IVC filter  . Repeated falls   . Rupture of right patellar tendon   . Sciatica   . Shortness of breath dyspnea    with exertion   . Sleep apnea    uses oxygen at night and PRN- not used since > 6 months / DOES NOT USE  C-PAP  . Spinal stenosis   . Stroke (HCC)   . Symptomatic bradycardia   . Symptomatic bradycardia 2012   s/p Medtronic PPM  . Syncope   . Urinary incontinence   . Urinary urgency    Past Surgical History:  Procedure Laterality Date  . ABDOMINAL HYSTERECTOMY    . APPENDECTOMY    . BACK SURGERY    . BUNIONECTOMY    . CARDIAC CATHETERIZATION    . CATARACT EXTRACTION    . CENTRAL VENOUS CATHETER INSERTION Left 12/21/2012   Procedure: INSERTION CENTRAL LINE ADULT;  Surgeon: Tonny Bollman, MD;  Location: [facility] OR;  Service: Open Heart Surgery;  Laterality: Left;  . ELBOW SURGERY     bilat   . INTRAOPERATIVE TRANSESOPHAGEAL ECHOCARDIOGRAM N/A 12/21/2012   Procedure: INTRAOPERATIVE TRANSESOPHAGEAL ECHOCARDIOGRAM;  Surgeon: Tonny Bollman, MD;  Location: [facility] OR;  Service: Open Heart Surgery;  Laterality: N/A;  . KNEE SURGERY Left   . LEFT AND RIGHT HEART CATHETERIZATION WITH CORONARY ANGIOGRAM N/A 10/18/2012   Procedure: LEFT AND RIGHT HEART CATHETERIZATION WITH CORONARY ANGIOGRAM;  Surgeon: Tonny Bollman, MD;  Location: [facility] CATH LAB;  Service: Cardiovascular;  Laterality:  N/A;  . NASAL SEPTUM SURGERY    . NOSE SURGERY     X 2  . ORIF PATELLA Right 03/08/2015   Procedure:  OPEN REDUCTION INTERNAL FIXATION RIGHT  PATELLA TENDON AVULSION;  Surgeon: Durene Romans, MD;  Location: [facility] ORS;  Service: Orthopedics;  Laterality: Right;  . PACEMAKER INSERTION     Medtronic  . PATELLAR TENDON REPAIR Right 06/25/2015   Procedure: RIGHT PATELLA TENDON REVISION/REPAIR;  Surgeon: Durene Romans, MD;  Location: [facility] ORS;  Service: Orthopedics;  Laterality: Right;  . SHOULDER SURGERY     bilat   . THYROIDECTOMY, PARTIAL    . TONSILLECTOMY    . TOTAL KNEE ARTHROPLASTY Right 12/04/2014   Procedure: RIGHT TOTAL  KNEE ARTHROPLASTY;  Surgeon: Durene Romans, MD;  Location: [facility] ORS;  Service: Orthopedics;  Laterality: Right;  . TRANSCATHETER AORTIC VALVE REPLACEMENT, TRANSFEMORAL  12/21/2012   a. 29mm Edwards Sapien  XT transcatheter heart valve placed via open left transfemoral approach b. Intra-op TEE: well-seated bioprosthetic aortic valve with mean gradient 2 mmHg, trivial AI, mild MR, EF 30-35%  . TRANSCATHETER AORTIC VALVE REPLACEMENT, TRANSFEMORAL N/A 12/21/2012   Procedure: TRANSCATHETER AORTIC VALVE REPLACEMENT, TRANSFEMORAL;  Surgeon: Tonny Bollman, MD;  [facility] OR;  Service: Open Heart Surgery;  Laterality: N/A;   Social History:   reports that she has never smoked. She has never used smokeless tobacco. She reports that she does not drink alcohol or use drugs.  Family History  Problem Relation Age of Onset  . Ovarian cancer Mother     Deceased  . Epilepsy Father     Deceased    Medications: Allergies as of 07/01/2016      Reactions   Nubain [nalbuphine Hcl] Hives   Went into cardiac arrest    Codeine Hives, Nausea Only   Darvocet [propoxyphene N-acetaminophen] Nausea Only      Medication List       Accurate as of 07/01/16  1:44 PM. Always use your most recent med list.          alendronate 70 MG/75ML solution Commonly known as:  FOSAMAX Take 70 mg by mouth  every 7 (seven) days. Take with a full glass of water on an empty stomach on Saturday   amiodarone 100 MG tablet Commonly known as:  PACERONE Take 100 mg by mouth daily.   atorvastatin 10 MG tablet Commonly known as:  LIPITOR Take 10 mg by mouth daily at 6 PM.   BIOFREEZE 4 % Gel Generic drug:  Menthol (Topical Analgesic) Apply 1 application topically 2 (two) times daily. Apply to back of neck, lower back, and right leg   ICY HOT ADVANCED RELIEF 7.5 % Ptch Generic drug:  Menthol Apply 3 patches topically daily. Apply 1 patch to each knee and one patch across lower back.  Apply at 10AM and remove at 10PM   BIOTIN 5000 PO Take 5,000 mcg by mouth every evening.   bisacodyl 10 MG suppository Commonly known as:  DULCOLAX Place 10 mg rectally daily as needed for moderate constipation.   calcitRIOL 0.25 MCG capsule Commonly known as:  ROCALTROL Take 0.25 mcg by mouth every morning.   carvedilol 12.5 MG tablet Commonly known as:  COREG Take 2 tablets (25 mg total) by mouth 2 (two) times daily.   cetirizine 10 MG tablet Commonly known as:  ZYRTEC Take 10 mg by mouth daily.   cycloSPORINE 0.05 % ophthalmic emulsion Commonly known as:  RESTASIS Place 1 drop into both eyes 2 (two) times daily.   donepezil 10 MG tablet Commonly known as:  ARICEPT Take 10 mg by mouth at bedtime.   DULoxetine 60 MG capsule Commonly known as:  CYMBALTA Take 60 mg by mouth every evening.   esomeprazole 40 MG capsule Commonly known as:  NEXIUM Take 40 mg by mouth daily.   ferrous sulfate 325 (65 FE) MG tablet Take 325 mg by mouth daily.   hydrALAZINE 25 MG tablet Commonly known as:  APRESOLINE Take 25 mg by mouth 3 (three) times daily.   HYDROcodone-acetaminophen 7.5-325 MG tablet Commonly known as:  NORCO Take 1-2 tablets by mouth every 4 (four) hours as needed for moderate pain.   ipratropium-albuterol 0.5-2.5 (3) MG/3ML Soln Commonly known as:  DUONEB Take 3 mLs by nebulization  every 6 (six) hours as needed (wheezing/shortness of breath).   isosorbide mononitrate 30 MG 24 hr tablet Commonly known as:  IMDUR Take  1 tablet (30 mg total) by mouth daily.   levothyroxine 112 MCG tablet Commonly known as:  SYNTHROID, LEVOTHROID Take 112 mcg by mouth daily.   LINZESS 145 MCG Caps capsule Generic drug:  linaclotide Take 145 mcg by mouth daily.   memantine 5 MG tablet Commonly known as:  NAMENDA Take 5 mg by mouth 2 (two) times daily.   multivitamin tablet Take 1 tablet by mouth daily.   nitroGLYCERIN 0.4 MG SL tablet Commonly known as:  NITROSTAT Place 0.4 mg under the tongue every 5 (five) minutes as needed for chest pain (MAX 3 TABLETS).   polyethylene glycol packet Commonly known as:  MIRALAX / GLYCOLAX Take 17 g by mouth 2 (two) times daily.   pramoxine 1 % Lotn Commonly known as:  SARNA SENSITIVE Apply 1 application topically daily as needed (itching).   pregabalin 50 MG capsule Commonly known as:  LYRICA Take 1 capsule (50 mg total) by mouth at bedtime. 6AM, 2PM, 10PM   senna 8.6 MG Tabs tablet Commonly known as:  SENOKOT Take 2 tablets by mouth 2 (two) times daily.   simethicone 80 MG chewable tablet Commonly known as:  MYLICON Chew 80 mg by mouth every 12 (twelve) hours as needed for flatulence.   Turmeric 500 MG Caps Take 1 capsule by mouth 2 (two) times daily.   ULORIC 40 MG tablet Generic drug:  febuxostat Take 40 mg by mouth every evening.   vitamin B-12 1000 MCG tablet Commonly known as:  CYANOCOBALAMIN Take 1,000 mcg by mouth 2 (two) times a week. Tuesday and Friday   Vitamin D-3 1000 units Caps Take 2 capsules by mouth. Take 2 capsules to = 2000 units PO QHS       Immunizations: Immunization History  Administered Date(s) Administered  . PPD Test 06/29/2015     Physical Exam: Vitals:   07/01/16 1338  BP: (!) 148/67  Pulse: 62  Resp: 18  Temp: (!) 96.7 F (35.9 C)  TempSrc: Oral  SpO2: 96%  Weight: 176 lb  (79.8 kg)  Height: 4\' 10"  (1.473 m)   Body mass index is 36.78 kg/m.  General- elderly female, obese, in no acute distress Head- normocephalic, atraumatic Nose- no maxillary or frontal sinus tenderness, no nasal discharge Throat- moist mucus membrane, normal oropharynx, has dentures Eyes- PERRLA, EOMI, no pallor, no icterus, no discharge, normal conjunctiva, normal sclera Neck- no cervical lymphadenopathy Cardiovascular- normal s1,s2, no murmur Respiratory- bilateral clear to auscultation, no wheeze, no rhonchi, no crackles, no use of accessory muscles Abdomen- bowel sounds present, soft, non tender, no guarding or rigidity, no CVA tenderness Musculoskeletal- able to move all 4 extremities, on wheelchair,  1+ leg edema  Neurological- alert and oriented to person, place and time Skin- warm and dry Psychiatry- normal mood and affect    Labs reviewed: Basic Metabolic Panel:  Recent Labs  85/46/27 0534 06/26/16 0414 06/27/16 0522  NA 139 138 139  K 4.1 4.6 4.9  CL 102 100* 104  CO2 30 28 26   GLUCOSE 92 86 82  BUN 52* 71* 64*  CREATININE 2.64* 3.27* 2.95*  CALCIUM 10.0 9.8 9.2   Liver Function Tests:  Recent Labs  07/03/15 04/07/16 06/13/16 0521  AST 15 22 26   ALT 4* 12 14  ALKPHOS 70 62 59  BILITOT  --   --  0.8  PROT  --   --  5.9*  ALBUMIN  --   --  3.1*   No results for input(s): LIPASE, AMYLASE  in the last 8760 hours. No results for input(s): AMMONIA in the last 8760 hours. CBC:  Recent Labs  08/29/15 04/08/16 06/13/16 0521  06/23/16 0843 06/24/16 0428 06/27/16 0522  WBC 4.5 6.3 14.0*  < > 8.9 7.2 7.0  NEUTROABS 2 4 11.6*  --   --   --   --   HGB 10.6* 11.8* 11.8*  < > 11.0* 10.1* 10.5*  HCT 34* 36 36.9  < > 34.0* 30.7* 32.1*  MCV  --   --  95.1  < > 93.4 93.9 94.1  PLT 176 194 220  < > 213 178 193  < > = values in this interval not displayed. Cardiac Enzymes:  Recent Labs  06/22/16 1414 06/22/16 1811 06/23/16 0049  TROPONINI 0.03* 0.03*  <0.03   BNP: Invalid input(s): POCBNP CBG:  Recent Labs  06/26/16 2042 06/27/16 0744 06/27/16 1143  GLUCAP 136* 86 106*    Radiological Exams: Dg Chest 2 View  Result Date: 06/25/2016 CLINICAL DATA:  CHF EXAM: CHEST  2 VIEW COMPARISON:  06/23/2016 FINDINGS: Heart remains moderately enlarged. Postoperative changes from aortic valvular repair are noted. Double lead right subclavian pacemaker device and leads are stable and intact. Normal pulmonary vascularity. Low volumes. Subsegmental atelectasis at the left base is unchanged. No pneumothorax. Right lower lateral rib deformity is stable. IMPRESSION: No evidence of CHF. Subsegmental atelectasis at the left base. Electronically Signed   By: Jolaine Click M.D.   On: 06/25/2016 07:54   Dg Chest 2 View  Result Date: 06/23/2016 CLINICAL DATA:  CHF, shortness of breath EXAM: CHEST  2 VIEW COMPARISON:  06/15/2016 FINDINGS: Right-sided duo lead pacemaker similar compared to prior. Valvular prosthesis. Mild increased atelectasis or infiltrate at the left lung base. Suspect tiny left pleural effusion. Stable enlarged cardiomediastinal without overt failure. Atherosclerosis. No pneumothorax. IMPRESSION: 1. Increasing atelectasis or infiltrate at the left base with possible tiny left effusion 2. Stable cardiomegaly Electronically Signed   By: Jasmine Pang M.D.   On: 06/23/2016 14:23   Nm Myocar Multi W/spect W/wall Motion / Ef  Result Date: 06/16/2016 CLINICAL DATA:  Chest pain. EXAM: MYOCARDIAL IMAGING WITH SPECT (REST AND PHARMACOLOGIC-STRESS) GATED LEFT VENTRICULAR WALL MOTION STUDY LEFT VENTRICULAR EJECTION FRACTION TECHNIQUE: Standard myocardial SPECT imaging was performed after resting intravenous injection of 10 mCi Tc-65m tetrofosmin. Subsequently, intravenous infusion of Lexiscan was performed under the supervision of the Cardiology staff. At peak effect of the drug, 30 mCi Tc-38m tetrofosmin was injected intravenously and standard myocardial SPECT  imaging was performed. Quantitative gated imaging was also performed to evaluate left ventricular wall motion, and estimate left ventricular ejection fraction. COMPARISON:  None. FINDINGS: Perfusion: Moderate to large fixed defect involving the inferior wall and part of the apex consistent with scar. No reversible defects to suggest ischemia. Wall Motion: Wall motion abnormality involving the inferior wall and apex. Moderate left ventricular dilatation. Left Ventricular Ejection Fraction: 42 % End diastolic volume 122 ml End systolic volume 70 ml IMPRESSION: 1. Infarction/scar involving the inferior wall and part of the apex. No reversible defects to suggest ischemia. 2. Wall motion abnormality involving the inferior wall and part of the apex. Moderate left ventricular dilatation. 3. Left ventricular ejection fraction 42% 4. Non invasive risk stratification*: High risk. *2012 Appropriate Use Criteria for Coronary Revascularization Focused Update: J Am Coll Cardiol. 2012;59(9):857-881. http://content.dementiazones.com.aspx?articleid=[email or web address] Electronically Signed   By: Rudie Meyer M.D.   On: 06/16/2016 09:20   Dg Chest Port 1 View  Result Date: 06/15/2016 CLINICAL  DATA:  Patient admitted with respiratory distress 06/13/2016. EXAM: PORTABLE CHEST 1 VIEW COMPARISON:  Single-view of the chest 06/13/2016. CT chest 12/14/2014. PA and lateral chest 11/29/2014. FINDINGS: There is cardiomegaly. The patient is status post aortic valve repair. Pacing device is in place. Atherosclerosis is noted. Pulmonary edema seen on the most recent examination is markedly improved. No pneumothorax or pleural effusion. IMPRESSION: Markedly improved pulmonary edema. Cardiomegaly. Atherosclerosis. Electronically Signed   By: Drusilla Kanner M.D.   On: 06/15/2016 14:15   Dg Chest Port 1 View  Result Date: 06/13/2016 CLINICAL DATA:  83 year old female with shortness of breath. EXAM: PORTABLE CHEST 1 VIEW COMPARISON:  Chest CT  dated 12/14/2014 under radiograph dated 11/29/2014 FINDINGS: There is stable cardiomegaly. An aortic valve stent is noted. Central vascular prominence and diffuse interstitial densities noted likely mild vascular congestion and edema. Bilateral lower lung field hazy densities may represent atelectasis versus infiltrate. There is blunting of the left costophrenic angle which may represent a small pleural effusion. There is no pneumothorax. Right pectoral pacemaker device noted. No acute osseous pathology. IMPRESSION: 1. Cardiomegaly with probable mild congestive changes and interstitial edema. 2. Bibasilar atelectasis versus infiltrate. 3. Probable small left pleural effusion. Electronically Signed   By: Elgie Collard M.D.   On: 06/13/2016 05:18    Assessment/Plan  Physical deconditioning Will have patient work with PT/OT as tolerated to regain strength and restore function.  Fall precautions are in place.  Chronic systolic and diastolic chf With recent acute exacerbation. Required iv diuresis in the hospital. Torsemide on hold now. Continue coreg 25 mg bid, hydralazine 25 mg tid and isosorbide 30 mg dailyWill need cardiology follow up with Dr Excell Seltzer. Monitor daily weight for now. Check bmp today.  afib controlled heart rate. INR today 3.3. Hold coumadin and check inr 07/02/16. Continue pacerone and coreg. Will make cardiology follow up  supratherapeutic inr Has inr 3.3. Hold coumadin for today and check inr 3/14.   ckd stage 4 Monitor BMP and if stable, consider resuming low dose torsemide.   Anemia of chronic disease Monitor cbc. Continue iron supplement.   gerd Stable on nexium, no changes made  Hypothyroidism Continue levothyroxine 112 mcg daily   Goals of care: long term care   Labs/tests ordered: bmp  Family/ staff Communication: reviewed care plan with patient and nursing supervisor    Oneal Grout, MD Internal Medicine [facility] [address] [place], [place] [address] Cell Phone (Monday-Friday 8 am - 5 pm): [phone] On Call: [phone] and follow prompts after 5 pm and on weekends Office Phone: [phone] Office Fax: [phone]

## 2016-07-01 NOTE — Progress Notes (Addendum)
DATE:  06/30/2016  MRN:  [id number]  BIRTHDAY: [birth year]  Facility:  Nursing Home Location:  [facility]  Nursing Home Room Number: [id number]  LEVEL OF CARE:  SNF [phone])  Contact Information    Name Relation Home Work [place] [phone]  [phone]   Gray,Sharon Daughter [phone]  [phone]       Code Status History    Date Active Date Inactive Code Status Order ID Comments User Context   06/13/2016 10:17 AM 06/27/2016  6:11 PM Full Code [id number]  Levie Heritage, DO ED   03/08/2015  8:25 PM 03/12/2015  5:09 PM Full Code [id number]  Lanney Gins, PA-C Inpatient   12/15/2014 12:39 AM 12/15/2014  6:36 PM Full Code [id number]  Yevonne Pax, MD Inpatient   12/04/2014  4:00 PM 12/07/2014  5:13 PM Full Code [id number]  Lanney Gins, PA-C Inpatient   12/21/2012  3:56 PM 12/28/2012  7:34 PM DNR [id number]  Tonny Bollman, MD Inpatient       Chief Complaint  Patient presents with  . Hospitalization Follow-up    HISTORY OF PRESENT ILLNESS:  This is an 83-YO female seen for hospital follow-up.  She was re-admitted to [facility] on 06/27/2016 following an admission at [facility] 06/13/2016-06/27/2016 for acute respiratory distress. She was found to have acute on chronic systolic and diastolic heart failure. Echo showed EF of 35-40%. 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 Cardiology followed and was diuresed with IV Lasix, negative 9.3L, then switched to Torsemide 100 mg daily then switched to 60 mg daily due to worsening renal function. However, creatinine bumped to 3.2 so Torsemide was stopped and was hydrated gently with NS. Plan was to re-start Torsemide with close monitoring of BMP.  She was seen in the room today and did not verbalized any concerns. She was happy to be back @ [facility].    PAST MEDICAL HISTORY:  Past Medical History:  Diagnosis Date  . Acute on chronic renal failure [facility])    sees Dr Allena Katz     . Anemia    Acute blood loss  . Aortic regurgitation   . Aortic stenosis 10/13/2012   Low EF, low gradient with severe aortic stenosis confirmed by dobutamine stress echocardiogram s/p TAVR 12/2012  . Asthma   . Atrial fibrillation (HCC)    tachy-brady syndrome with <1% recurrent PAF since pacemaker placement  . Cataracts, bilateral   . Chronic combined systolic and diastolic CHF (congestive heart failure) (HCC)   . Chronic lower back pain   . CKD (chronic kidney disease)   . Coronary artery disease involving native coronary artery of native heart   . Dementia    Without behavioral disturbance  . Dysrhythmia   . Fibromyalgia   . Gastroesophageal reflux disease   . H/O dizziness   . H/O urinary frequency   . H/O: stroke   . Hard of hearing   . Headache    hx of migraines   . Heart murmur   . History of blood transfusion   . History of bronchitis   . History of kidney stones   . History of urinary tract infection   . HLD (hyperlipidemia)   . HTN (hypertension)   . Hypothyroidism   . Neuropathy (HCC)   . Nonischemic cardiomyopathy (HCC)   . On home oxygen therapy    patient uses at nite- 2L- has not used in > 6 months  per patient  . Osteoarthritis   . Osteoporosis   . Pneumonia    hx of x 3   . PONV (postoperative nausea and vomiting)   . Presence of permanent cardiac pacemaker   . Pulmonary embolism (HCC)    HISTORY OF, the pt. had a recurrent bilateral pulmonary emboli in 2005, on warfarin therapy and at which time she under went implantation of IVC filter  . Repeated falls   . Rupture of right patellar tendon   . Sciatica   . Shortness of breath dyspnea    with exertion   . Sleep apnea    uses oxygen at night and PRN- not used since > 6 months / DOES NOT USE  C-PAP  . Spinal stenosis   . Stroke (HCC)   . Symptomatic bradycardia   . Symptomatic bradycardia 2012   s/p Medtronic PPM  . Syncope   . Urinary incontinence   . Urinary urgency      CURRENT  MEDICATIONS: Reviewed  Patient's Medications  New Prescriptions   No medications on file  Previous Medications   ALENDRONATE (FOSAMAX) 70 MG/75ML SOLUTION    Take 70 mg by mouth every 7 (seven) days. Take with a full glass of water on an empty stomach on Saturday   AMIODARONE (PACERONE) 100 MG TABLET    Take 100 mg by mouth daily.   ATORVASTATIN (LIPITOR) 10 MG TABLET    Take 10 mg by mouth daily at 6 PM.    BIOTIN 5000 PO    Take 5,000 mcg by mouth every evening.    BISACODYL (DULCOLAX) 10 MG SUPPOSITORY    Place 10 mg rectally daily as needed for moderate constipation.   CALCITRIOL (ROCALTROL) 0.25 MCG CAPSULE    Take 0.25 mcg by mouth every morning.    CARVEDILOL (COREG) 12.5 MG TABLET    Take 2 tablets (25 mg total) by mouth 2 (two) times daily.   CETIRIZINE (ZYRTEC) 10 MG TABLET    Take 10 mg by mouth daily.    CHOLECALCIFEROL (VITAMIN D-3) 1000 UNITS CAPS    Take 2 capsules by mouth. Take 2 capsules to = 2000 units PO QHS   CYCLOSPORINE (RESTASIS) 0.05 % OPHTHALMIC EMULSION    Place 1 drop into both eyes 2 (two) times daily.    DONEPEZIL (ARICEPT) 10 MG TABLET    Take 10 mg by mouth at bedtime.    DULOXETINE (CYMBALTA) 60 MG CAPSULE    Take 60 mg by mouth every evening.    ESOMEPRAZOLE (NEXIUM) 40 MG CAPSULE    Take 40 mg by mouth daily.    FERROUS SULFATE 325 (65 FE) MG TABLET    Take 325 mg by mouth daily.    HYDRALAZINE (APRESOLINE) 25 MG TABLET    Take 25 mg by mouth 3 (three) times daily.   HYDROCODONE-ACETAMINOPHEN (NORCO) 7.5-325 MG TABLET    Take 1-2 tablets by mouth every 4 (four) hours as needed for moderate pain.   IPRATROPIUM-ALBUTEROL (DUONEB) 0.5-2.5 (3) MG/3ML SOLN    Take 3 mLs by nebulization every 6 (six) hours as needed (wheezing/shortness of breath).   ISOSORBIDE MONONITRATE (IMDUR) 30 MG 24 HR TABLET    Take 1 tablet (30 mg total) by mouth daily.   LEVOTHYROXINE (SYNTHROID, LEVOTHROID) 112 MCG TABLET    Take 112 mcg by mouth daily.    LINACLOTIDE (LINZESS) 145 MCG  CAPS CAPSULE    Take 145 mcg by mouth daily.    MEMANTINE (NAMENDA) 5 MG  TABLET    Take 5 mg by mouth 2 (two) times daily.    MENTHOL (ICY HOT ADVANCED RELIEF) 7.5 % PTCH    Apply 3 patches topically daily. Apply 1 patch to each knee and one patch across lower back.  Apply at 10AM and remove at 10PM   MENTHOL, TOPICAL ANALGESIC, (BIOFREEZE) 4 % GEL    Apply 1 application topically 2 (two) times daily. Apply to back of neck, lower back, and right leg   MULTIPLE VITAMIN (MULTIVITAMIN) TABLET    Take 1 tablet by mouth daily.    NITROGLYCERIN (NITROSTAT) 0.4 MG SL TABLET    Place 0.4 mg under the tongue every 5 (five) minutes as needed for chest pain (MAX 3 TABLETS).    POLYETHYLENE GLYCOL (MIRALAX / GLYCOLAX) PACKET    Take 17 g by mouth 2 (two) times daily.   PRAMOXINE (SARNA SENSITIVE) 1 % LOTN    Apply 1 application topically daily as needed (itching).    PREGABALIN (LYRICA) 50 MG CAPSULE    Take 1 capsule (50 mg total) by mouth at bedtime. 6AM, 2PM, 10PM   SENNA (SENOKOT) 8.6 MG TABS TABLET    Take 2 tablets by mouth 2 (two) times daily.   SIMETHICONE (MYLICON) 80 MG CHEWABLE TABLET    Chew 80 mg by mouth every 12 (twelve) hours as needed for flatulence.    TURMERIC 500 MG CAPS    Take 1 capsule by mouth 2 (two) times daily.   ULORIC 40 MG TABLET    Take 40 mg by mouth every evening.    VITAMIN B-12 (CYANOCOBALAMIN) 1000 MCG TABLET    Take 1,000 mcg by mouth 2 (two) times a week. Tuesday and Friday  Modified Medications   No medications on file  Discontinued Medications   WARFARIN (COUMADIN) 3 MG TABLET    Take 1.5 mg by mouth daily. Take 1/2 of a 3 mg tablet to = 1.5 mg qd     Allergies  Allergen Reactions  . Nubain [Nalbuphine Hcl] Hives    Went into cardiac arrest   . Codeine Hives and Nausea Only  . Darvocet [Propoxyphene N-Acetaminophen] Nausea Only     REVIEW OF SYSTEMS:  GENERAL: no change in appetite, no fatigue, no weight changes, no fever, chills or weakness EYES: Denies  change in vision, dry eyes, eye pain, itching or discharge EARS: Denies change in hearing, ringing in ears, or earache NOSE: Denies nasal congestion or epistaxis MOUTH and THROAT: Denies oral discomfort, gingival pain or bleeding, pain from teeth or hoarseness   RESPIRATORY: no cough, SOB, DOE, wheezing, hemoptysis CARDIAC: no chest pain, edema or palpitations GI: no abdominal pain, diarrhea, constipation, heart burn, nausea or vomiting GU: Denies dysuria, frequency, hematuria, incontinence, or discharge PSYCHIATRIC: Denies feeling of depression or anxiety. No report of hallucinations, insomnia, paranoia, or agitation     PHYSICAL EXAMINATION  GENERAL APPEARANCE: Well nourished. In no acute distress. Obese SKIN:  Skin is warm and dry.  HEAD: Normal in size and contour. No evidence of trauma EYES: Lids open and close normally. No blepharitis, entropion or ectropion. PERRL. Conjunctivae are clear and sclerae are white. Lenses are without opacity EARS: Pinnae are normal. Patient hears normal voice tunes of the examiner MOUTH and THROAT: Lips are without lesions. Oral mucosa is moist and without lesions. Tongue is normal in shape, size, and color and without lesions NECK: supple, trachea midline, no neck masses, no thyroid tenderness, no thyromegaly LYMPHATICS: no LAN in the neck,  no supraclavicular LAN RESPIRATORY: breathing is even & unlabored, BS CTAB CARDIAC: RRR, no murmur,no extra heart sounds, no edema, + right chest pacemaker GI: abdomen soft, normal BS, no masses, no tenderness, no hepatomegaly, no splenomegaly EXTREMITIES:  Able to move X 4 extremities PSYCHIATRIC: Alert and oriented X 3. Affect and behavior are appropriate    LABS/RADIOLOGY: Labs reviewed: Basic Metabolic Panel:  Recent Labs  16/10/96 0534 06/26/16 0414 06/27/16 0522  NA 139 138 139  K 4.1 4.6 4.9  CL 102 100* 104  CO2 30 28 26   GLUCOSE 92 86 82  BUN 52* 71* 64*  CREATININE 2.64* 3.27* 2.95*    CALCIUM 10.0 9.8 9.2   Liver Function Tests:  Recent Labs  07/03/15 04/07/16 06/13/16 0521  AST 15 22 26   ALT 4* 12 14  ALKPHOS 70 62 59  BILITOT  --   --  0.8  PROT  --   --  5.9*  ALBUMIN  --   --  3.1*   CBC:  Recent Labs  08/29/15 04/08/16 06/13/16 0521  06/23/16 0843 06/24/16 0428 06/27/16 0522  WBC 4.5 6.3 14.0*  < > 8.9 7.2 7.0  NEUTROABS 2 4 11.6*  --   --   --   --   HGB 10.6* 11.8* 11.8*  < > 11.0* 10.1* 10.5*  HCT 34* 36 36.9  < > 34.0* 30.7* 32.1*  MCV  --   --  95.1  < > 93.4 93.9 94.1  PLT 176 194 220  < > 213 178 193  < > = values in this interval not displayed. Lipid Panel:  Recent Labs  10/01/15  HDL 36   Cardiac Enzymes:  Recent Labs  06/22/16 1414 06/22/16 1811 06/23/16 0049  TROPONINI 0.03* 0.03* <0.03   CBG:  Recent Labs  06/26/16 2042 06/27/16 0744 06/27/16 1143  GLUCAP 136* 86 106*      Dg Chest 2 View  Result Date: 06/25/2016 CLINICAL DATA:  CHF EXAM: CHEST  2 VIEW COMPARISON:  06/23/2016 FINDINGS: Heart remains moderately enlarged. Postoperative changes from aortic valvular repair are noted. Double lead right subclavian pacemaker device and leads are stable and intact. Normal pulmonary vascularity. Low volumes. Subsegmental atelectasis at the left base is unchanged. No pneumothorax. Right lower lateral rib deformity is stable. IMPRESSION: No evidence of CHF. Subsegmental atelectasis at the left base. Electronically Signed   By: Jolaine Click M.D.   On: 06/25/2016 07:54   Dg Chest 2 View  Result Date: 06/23/2016 CLINICAL DATA:  CHF, shortness of breath EXAM: CHEST  2 VIEW COMPARISON:  06/15/2016 FINDINGS: Right-sided duo lead pacemaker similar compared to prior. Valvular prosthesis. Mild increased atelectasis or infiltrate at the left lung base. Suspect tiny left pleural effusion. Stable enlarged cardiomediastinal without overt failure. Atherosclerosis. No pneumothorax. IMPRESSION: 1. Increasing atelectasis or infiltrate at the left  base with possible tiny left effusion 2. Stable cardiomegaly Electronically Signed   By: Jasmine Pang M.D.   On: 06/23/2016 14:23   Nm Myocar Multi W/spect W/wall Motion / Ef  Result Date: 06/16/2016 CLINICAL DATA:  Chest pain. EXAM: MYOCARDIAL IMAGING WITH SPECT (REST AND PHARMACOLOGIC-STRESS) GATED LEFT VENTRICULAR WALL MOTION STUDY LEFT VENTRICULAR EJECTION FRACTION TECHNIQUE: Standard myocardial SPECT imaging was performed after resting intravenous injection of 10 mCi Tc-44m tetrofosmin. Subsequently, intravenous infusion of Lexiscan was performed under the supervision of the Cardiology staff. At peak effect of the drug, 30 mCi Tc-68m tetrofosmin was injected intravenously and standard myocardial SPECT imaging was performed.  Quantitative gated imaging was also performed to evaluate left ventricular wall motion, and estimate left ventricular ejection fraction. COMPARISON:  None. FINDINGS: Perfusion: Moderate to large fixed defect involving the inferior wall and part of the apex consistent with scar. No reversible defects to suggest ischemia. Wall Motion: Wall motion abnormality involving the inferior wall and apex. Moderate left ventricular dilatation. Left Ventricular Ejection Fraction: 42 % End diastolic volume 122 ml End systolic volume 70 ml IMPRESSION: 1. Infarction/scar involving the inferior wall and part of the apex. No reversible defects to suggest ischemia. 2. Wall motion abnormality involving the inferior wall and part of the apex. Moderate left ventricular dilatation. 3. Left ventricular ejection fraction 42% 4. Non invasive risk stratification*: High risk. *2012 Appropriate Use Criteria for Coronary Revascularization Focused Update: J Am Coll Cardiol. 2012;59(9):857-881. http://content.dementiazones.com.aspx?articleid=[email or web address] Electronically Signed   By: Rudie Meyer M.D.   On: 06/16/2016 09:20   Dg Chest Port 1 View  Result Date: 06/15/2016 CLINICAL DATA:  Patient admitted with  respiratory distress 06/13/2016. EXAM: PORTABLE CHEST 1 VIEW COMPARISON:  Single-view of the chest 06/13/2016. CT chest 12/14/2014. PA and lateral chest 11/29/2014. FINDINGS: There is cardiomegaly. The patient is status post aortic valve repair. Pacing device is in place. Atherosclerosis is noted. Pulmonary edema seen on the most recent examination is markedly improved. No pneumothorax or pleural effusion. IMPRESSION: Markedly improved pulmonary edema. Cardiomegaly. Atherosclerosis. Electronically Signed   By: Drusilla Kanner M.D.   On: 06/15/2016 14:15   Dg Chest Port 1 View  Result Date: 06/13/2016 CLINICAL DATA:  83 year old female with shortness of breath. EXAM: PORTABLE CHEST 1 VIEW COMPARISON:  Chest CT dated 12/14/2014 under radiograph dated 11/29/2014 FINDINGS: There is stable cardiomegaly. An aortic valve stent is noted. Central vascular prominence and diffuse interstitial densities noted likely mild vascular congestion and edema. Bilateral lower lung field hazy densities may represent atelectasis versus infiltrate. There is blunting of the left costophrenic angle which may represent a small pleural effusion. There is no pneumothorax. Right pectoral pacemaker device noted. No acute osseous pathology. IMPRESSION: 1. Cardiomegaly with probable mild congestive changes and interstitial edema. 2. Bibasilar atelectasis versus infiltrate. 3. Probable small left pleural effusion. Electronically Signed   By: Elgie Collard M.D.   On: 06/13/2016 05:18    ASSESSMENT/PLAN:  Generalized weakness - for rehabilitation with PT and OT for therapeutic strengthening exercises; fall precautions  Chronic combined systolic and diastolic CHF - EF 35-40%, Torsemide currently on hold due to  worsening renal function; will re-check BMP; follow-up with cardiology, Dr. Excell Seltzer, on 07/08/16; weigh daily; continue Coreg 12.5 mg 1 tab by mouth twice a day, isosorbide mononitrate ER 30 mg 1 tab by mouth daily And hydralazine  25 mg 1 tab by mouth 3 times a day  Dementia without behavioral disturbance - continue Namenda 5 mg 1 tab by mouth twice a day and Aricept 10 mg 1 tab by mouth daily at bedtime; continue supportive care; fall precautions  Osteoporosis - continue Fosamax 70 mg by mouth every Saturdays  GERD - continue Nexium 40 mg 1 capsule by mouth daily  Hypothyroidism - continue levothyroxine 112 g 1 tab by mouth daily Lab Results  Component Value Date   TSH 2.673 06/15/2016   Chronic constipation - continue MiraLAX 17 g by mouth twice a day, Senokot 8.6 mg 2 tabs by mouth twice a day, Linzess 45 g 1 capsule by mouth daily  Atrial fibrillation - rate controlled; continue Pacerone 100 mg 1 tab by mouth daily, Coreg  12.5 mg 1 tab by mouth twice a day and Coumadin  CKD, stage 4 - continue Calcitrol 0.25 mg Q D; will monitor  Anemia of chronic disease - continue ferrous sulfate 325 mg 1 tab by mouth daily  Hyperlipidemia - continue Lipitor 10 mg 1 tab by mouth every 6 p.m.  Gout - continue Uloric 40 mg 1 tab by mouth daily  Hypertension - well-controlled; continue hydralazine 25 mg 1 tab by mouth 3 times a day, Coreg 12.5 m on a known g 1 tab by mouth twice a day  Neuropathy - continue Lyrica 50 mg 1 capsule by mouth 3 times a day, Norco 7.5/325 mg 1-2 tabs by mouth every 4 hours when necessary and Cymbalta 60 mg 1 capsule by mouth every evening  CAD - continue and NTG when necessary and isosorbide MN ER 30 mg 1 tab by mouth daily     Goals of care:  Long-term care     Monina C. Medina-Vargas - NP    [employer] [phone]

## 2016-07-02 NOTE — Progress Notes (Signed)
Cardiology Office Note    Date:  07/08/2016   ID:  [name], DOB 1933-02-20, MRN [id number]  PCP:  Oneal Grout, MD  Cardiologist:  Dr. Excell Seltzer Electrophysiologist: Dr. Ladona Ridgel  Chief Complaint: Hospital follow up for CHF  History of Present Illness:   [name] is a 83 y.o. female  w/ hx NICM, chronic systolic heart failure, and severe AS s/p TAVR 12/21/2012 w/ 29 mm Edwards Sapien XT valve, paroxysmal atrial fibrillation, HTN, CKD stage IV, chronic anticoagulation w/ warfarin s/p PPM by Dr. Ladona Ridgel presents for hospital follow up.   She had a cardiac catheterization in 2014 before TAVR and had minimal CAD.   She lives at [facility]. Admitted 02/23 with acute respiratory failure insetting of acute Heart failure. Symptoms improved with diuresis. Intermittently required hydration. Also holding of Torsemide due to elevated creatine.  Echo showed newly depressed EF of 35-45% with WM abnormality. Nuc was high risk but no reversible defect to suggest ischemia. She diuresed 8.1 L. Discharge weight 176. Dr. Excell Seltzer recommended medical therapy.   She hasn't followed up PCP or provider at facility to recheck her kidney function. Her weight is 173lb today. She has gradually has LE edema since discharge. Intermittent orthopnea and PND however attributed to lack of sleep. No chest pain, syncope or dizziness. She has brought letter from [employer] that coverage denied for amiodarone 100mg  qd. Discussed with refill department, will give rx of 200mg  and cut in half.     Past Medical History:  Diagnosis Date  . Acute on chronic renal failure [facility])    sees Dr Allena Katz   . Anemia    Acute blood loss  . Aortic regurgitation   . Aortic stenosis 10/13/2012   Low EF, low gradient with severe aortic stenosis confirmed by dobutamine stress echocardiogram s/p TAVR 12/2012  . Asthma   . Atrial fibrillation (HCC)    tachy-brady syndrome with <1% recurrent PAF since pacemaker placement  .  Cataracts, bilateral   . Chronic combined systolic and diastolic CHF (congestive heart failure) (HCC)   . Chronic lower back pain   . CKD (chronic kidney disease)   . Coronary artery disease involving native coronary artery of native heart   . Dementia    Without behavioral disturbance  . Dysrhythmia   . Fibromyalgia   . Gastroesophageal reflux disease   . H/O dizziness   . H/O urinary frequency   . H/O: stroke   . Hard of hearing   . Headache    hx of migraines   . Heart murmur   . History of blood transfusion   . History of bronchitis   . History of kidney stones   . History of urinary tract infection   . HLD (hyperlipidemia)   . HTN (hypertension)   . Hypothyroidism   . Neuropathy (HCC)   . Nonischemic cardiomyopathy (HCC)   . On home oxygen therapy    patient uses at nite- 2L- has not used in > 6 months per patient  . Osteoarthritis   . Osteoporosis   . Pneumonia    hx of x 3   . PONV (postoperative nausea and vomiting)   . Presence of permanent cardiac pacemaker   . Pulmonary embolism (HCC)    HISTORY OF, the pt. had a recurrent bilateral pulmonary emboli in 2005, on warfarin therapy and at which time she under went implantation of IVC filter  . Repeated falls   . Rupture of right patellar tendon   .  Sciatica   . Shortness of breath dyspnea    with exertion   . Sleep apnea    uses oxygen at night and PRN- not used since > 6 months / DOES NOT USE  C-PAP  . Spinal stenosis   . Stroke (HCC)   . Symptomatic bradycardia   . Symptomatic bradycardia 2012   s/p Medtronic PPM  . Syncope   . Urinary incontinence   . Urinary urgency     Past Surgical History:  Procedure Laterality Date  . ABDOMINAL HYSTERECTOMY    . APPENDECTOMY    . BACK SURGERY    . BUNIONECTOMY    . CARDIAC CATHETERIZATION    . CATARACT EXTRACTION    . CENTRAL VENOUS CATHETER INSERTION Left 12/21/2012   Procedure: INSERTION CENTRAL LINE ADULT;  Surgeon: Tonny Bollman, MD;  Location: [facility] OR;   Service: Open Heart Surgery;  Laterality: Left;  . ELBOW SURGERY     bilat   . INTRAOPERATIVE TRANSESOPHAGEAL ECHOCARDIOGRAM N/A 12/21/2012   Procedure: INTRAOPERATIVE TRANSESOPHAGEAL ECHOCARDIOGRAM;  Surgeon: Tonny Bollman, MD;  Location: [facility] OR;  Service: Open Heart Surgery;  Laterality: N/A;  . KNEE SURGERY Left   . LEFT AND RIGHT HEART CATHETERIZATION WITH CORONARY ANGIOGRAM N/A 10/18/2012   Procedure: LEFT AND RIGHT HEART CATHETERIZATION WITH CORONARY ANGIOGRAM;  Surgeon: Tonny Bollman, MD;  Location: [facility] CATH LAB;  Service: Cardiovascular;  Laterality: N/A;  . NASAL SEPTUM SURGERY    . NOSE SURGERY     X 2  . ORIF PATELLA Right 03/08/2015   Procedure:  OPEN REDUCTION INTERNAL FIXATION RIGHT  PATELLA TENDON AVULSION;  Surgeon: Durene Romans, MD;  Location: [facility] ORS;  Service: Orthopedics;  Laterality: Right;  . PACEMAKER INSERTION     Medtronic  . PATELLAR TENDON REPAIR Right 06/25/2015   Procedure: RIGHT PATELLA TENDON REVISION/REPAIR;  Surgeon: Durene Romans, MD;  Location: [facility] ORS;  Service: Orthopedics;  Laterality: Right;  . SHOULDER SURGERY     bilat   . THYROIDECTOMY, PARTIAL    . TONSILLECTOMY    . TOTAL KNEE ARTHROPLASTY Right 12/04/2014   Procedure: RIGHT TOTAL  KNEE ARTHROPLASTY;  Surgeon: Durene Romans, MD;  Location: [facility] ORS;  Service: Orthopedics;  Laterality: Right;  . TRANSCATHETER AORTIC VALVE REPLACEMENT, TRANSFEMORAL  12/21/2012   a. 29mm Edwards Sapien XT transcatheter heart valve placed via open left transfemoral approach b. Intra-op TEE: well-seated bioprosthetic aortic valve with mean gradient 2 mmHg, trivial AI, mild MR, EF 30-35%  . TRANSCATHETER AORTIC VALVE REPLACEMENT, TRANSFEMORAL N/A 12/21/2012   Procedure: TRANSCATHETER AORTIC VALVE REPLACEMENT, TRANSFEMORAL;  Surgeon: Tonny Bollman, MD;  Location: [facility] OR;  Service: Open Heart Surgery;  Laterality: N/A;    Current Medications: Prior to Admission medications   Medication Sig Start Date End Date Taking? Authorizing  Provider  alendronate (FOSAMAX) 70 MG/75ML solution Take 70 mg by mouth every 7 (seven) days. Take with a full glass of water on an empty stomach on Saturday    Historical Provider, MD  amiodarone (PACERONE) 100 MG tablet Take 100 mg by mouth daily.    Historical Provider, MD  atorvastatin (LIPITOR) 10 MG tablet Take 10 mg by mouth daily at 6 PM.     Historical Provider, MD  BIOTIN 5000 PO Take 5,000 mcg by mouth every evening.     Historical Provider, MD  bisacodyl (DULCOLAX) 10 MG suppository Place 10 mg rectally daily as needed for moderate constipation.    Historical Provider, MD  calcitRIOL (ROCALTROL) 0.25 MCG capsule Take 0.25  mcg by mouth every morning.     Historical Provider, MD  carvedilol (COREG) 12.5 MG tablet Take 2 tablets (25 mg total) by mouth 2 (two) times daily. 06/25/16   Zannie Cove, MD  cetirizine (ZYRTEC) 10 MG tablet Take 10 mg by mouth daily.     Historical Provider, MD  Cholecalciferol (VITAMIN D-3) 1000 units CAPS Take 2 capsules by mouth. Take 2 capsules to = 2000 units PO QHS    Historical Provider, MD  cycloSPORINE (RESTASIS) 0.05 % ophthalmic emulsion Place 1 drop into both eyes 2 (two) times daily.     Historical Provider, MD  donepezil (ARICEPT) 10 MG tablet Take 10 mg by mouth at bedtime.     Historical Provider, MD  DULoxetine (CYMBALTA) 60 MG capsule Take 60 mg by mouth every evening.     Historical Provider, MD  esomeprazole (NEXIUM) 40 MG capsule Take 40 mg by mouth daily.     Historical Provider, MD  ferrous sulfate 325 (65 FE) MG tablet Take 325 mg by mouth daily.     Historical Provider, MD  hydrALAZINE (APRESOLINE) 25 MG tablet Take 25 mg by mouth 3 (three) times daily.    Historical Provider, MD  HYDROcodone-acetaminophen (NORCO) 7.5-325 MG tablet Take 1-2 tablets by mouth every 4 (four) hours as needed for moderate pain.    Historical Provider, MD  ipratropium-albuterol (DUONEB) 0.5-2.5 (3) MG/3ML SOLN Take 3 mLs by nebulization every 6 (six) hours as  needed (wheezing/shortness of breath).    Historical Provider, MD  isosorbide mononitrate (IMDUR) 30 MG 24 hr tablet Take 1 tablet (30 mg total) by mouth daily. 06/27/16   Zannie Cove, MD  levothyroxine (SYNTHROID, LEVOTHROID) 112 MCG tablet Take 112 mcg by mouth daily.     Historical Provider, MD  Linaclotide Karlene Einstein) 145 MCG CAPS capsule Take 145 mcg by mouth daily.     Historical Provider, MD  memantine (NAMENDA) 5 MG tablet Take 5 mg by mouth 2 (two) times daily.     Historical Provider, MD  Menthol (ICY HOT ADVANCED RELIEF) 7.5 % PTCH Apply 3 patches topically daily. Apply 1 patch to each knee and one patch across lower back.  Apply at 10AM and remove at 10PM    Historical Provider, MD  Menthol, Topical Analgesic, (BIOFREEZE) 4 % GEL Apply 1 application topically 2 (two) times daily. Apply to back of neck, lower back, and right leg    Historical Provider, MD  Multiple Vitamin (MULTIVITAMIN) tablet Take 1 tablet by mouth daily.     Historical Provider, MD  nitroGLYCERIN (NITROSTAT) 0.4 MG SL tablet Place 0.4 mg under the tongue every 5 (five) minutes as needed for chest pain (MAX 3 TABLETS).     Historical Provider, MD  polyethylene glycol (MIRALAX / GLYCOLAX) packet Take 17 g by mouth 2 (two) times daily. 12/07/14   Lanney Gins, PA-C  pramoxine (SARNA SENSITIVE) 1 % LOTN Apply 1 application topically daily as needed (itching).     Historical Provider, MD  pregabalin (LYRICA) 50 MG capsule Take 1 capsule (50 mg total) by mouth at bedtime. 6AM, 2PM, 10PM 06/27/16   Zannie Cove, MD  senna (SENOKOT) 8.6 MG TABS tablet Take 2 tablets by mouth 2 (two) times daily.    Historical Provider, MD  simethicone (MYLICON) 80 MG chewable tablet Chew 80 mg by mouth every 12 (twelve) hours as needed for flatulence.     Historical Provider, MD  Turmeric 500 MG CAPS Take 1 capsule by mouth 2 (two) times  daily.    Historical Provider, MD  ULORIC 40 MG tablet Take 40 mg by mouth every evening.  12/02/12    Historical Provider, MD  vitamin B-12 (CYANOCOBALAMIN) 1000 MCG tablet Take 1,000 mcg by mouth 2 (two) times a week. Tuesday and Friday    Historical Provider, MD    Allergies:   Nubain [nalbuphine hcl]; Codeine; and Darvocet [propoxyphene n-acetaminophen]   Social History   Social History  . Marital status: Divorced    Spouse name: N/A  . Number of children: N/A  . Years of education: N/A   Social History Main Topics  . Smoking status: Never Smoker  . Smokeless tobacco: Never Used  . Alcohol use No  . Drug use: No  . Sexual activity: Not Currently   Other Topics Concern  . None   Social History Narrative  . None     Family History:  The patient's family history includes Epilepsy in her father; Ovarian cancer in her mother.   ROS:   Please see the history of present illness.    ROS All other systems reviewed and are negative.   PHYSICAL EXAM:   VS:  BP (!) 130/58   Pulse 69   Ht 4\' 10"  (1.473 m)   Wt 173 lb (78.5 kg) Comment: pt stated weight  BMI 36.16 kg/m    GEN: Well nourished, well developed, in no acute distress  HEENT: normal  Neck: no JVD, carotid bruits, or masses Cardiac: RRR; no murmurs, rubs, or gallops, 1+ BL LE edema  Respiratory:  clear to auscultation bilaterally, normal work of breathing GI: soft, nontender, nondistended, + BS MS: no deformity or atrophy  Skin: warm and dry, no rash Neuro:  Alert and Oriented x 3, Strength and sensation are intact Psych: euthymic mood, full affect  Wt Readings from Last 3 Encounters:  07/08/16 173 lb (78.5 kg)  07/01/16 176 lb (79.8 kg)  06/30/16 176 lb 8 oz (80.1 kg)      Studies/Labs Reviewed:   EKG:  EKG is ordered today.  The ekg ordered today demonstrates A paced rhythm   Recent Labs: 06/13/2016: ALT 14; B Natriuretic Peptide 528.3 06/15/2016: TSH 2.673 06/27/2016: BUN 64; Creatinine, Ser 2.95; Hemoglobin 10.5; Platelets 193; Potassium 4.9; Sodium 139   Lipid Panel    Component Value Date/Time     CHOL 153 10/01/2015   TRIG 247 (A) 10/01/2015   HDL 36 10/01/2015   CHOLHDL 1.8 06/16/2010 0415   VLDL 13 06/16/2010 0415   LDLCALC 67 10/01/2015    Additional studies/ records that were reviewed today include:   ECHO: 06/13/2016 - Left ventricle: LVEF is approximately 35 to 40% with hypokinesis of inferior, inferoseptal and anterior walls This is new compared to echo of 2016. The cavity size was moderately dilated. Wall thickness was normal. Features are consistent with a pseudonormal left ventricular filling pattern, with concomitant abnormal relaxation and increased filling pressure (grade 2 diastolic dysfunction). Doppler parameters are consistent with high ventricular filling pressure. - Aortic valve: AV prosthesis is difficult to see Peak and mean gradients through the valve are 12 and 7 mm Hg respectively. There is trace valvular AI Valve area (VTI): 1.82 cm^2. Valve area (Vmax): 1.7 cm^2. Valve area (Vmean): 1.89 cm^2. - Mitral valve: There was mild to moderate regurgitation. - Left atrium: The atrium was mildly dilated. - Pulmonary arteries: PA peak pressure: 39 mm Hg (S).  Myoview: 06/22/2016 IMPRESSION: 1. Infarction/scar involving the inferior wall and part of the apex. No  reversible defects to suggest ischemia. 2. Wall motion abnormality involving the inferior wall and part of the apex. Moderate left ventricular dilatation. 3. Left ventricular ejection fraction 42% 4. Non invasive risk stratification*: High risk.    ASSESSMENT & PLAN:    1. Acute on chronic combined CHF/ NICM - EF again depressed to 35-40% with +WM abnormality. Nuc showed no reversible defects to suggest ischemia. She has lost 3lb since discharge (176-->173lb) however noted LE swelling. Not JVD. Lungs clear.  (per discharge summery she should be on Torsemide 60mg  qd and recheck BEMT in 3 days - no date available from facility).  - Assuming she is not taking any  diuretics currently  - Will get BEMT today and start Torsemide 20mg   - If she is taking torsemide 60mg  qd --> increase to 80mg  qd  - Either way recheck BMET Thursday. Adjust dose based on labs and symptoms.  -Continue Coreg 25mg  BID, hydralazine 25mg  TID and Imdur 60mg  qd (per discharge summery). Not on ACE or ARB due to CKD.   *Weigh yourself on the same scale at same time of day and keep a log. *Report weight gain of > 3 lbs in 1 day or 5 lbs over the course of a week and/or symptoms of excess fluid (shortness of breath, difficulty lying flat, swelling, poor appetite, abdominal fullness/bloating, etc) to your doctor immediately. *Avoid foods that are high in sodium (processed, pre-packaged/canned goods, fast foods, etc). *Please attend all scheduled and reccommended follow up appointments   2. S/p TVAR  3. PAF  - Maintaining SR. CHA2DS2VASc score of 8. On coumadin. Continue Amiodarone 100mg  qd. and Coreg  4. S/p PPM - Followed by Dr. Ladona Ridgel  5. HTN - Well controlled on current regiment.  6. CKD stage IV - Discharge Scr of 2.6. As above.    Follow up with Dr. Excell Seltzer in 3 months.    Medication Adjustments/Labs and Tests Ordered: Current medicines are reviewed at length with the patient today.  Concerns regarding medicines are outlined above.  Medication changes, Labs and Tests ordered today are listed in the Patient Instructions below. There are no Patient Instructions on file for this visit.   Lorelei Pont, [place]  07/08/2016 11:45 AM    [facility] [address] [place], [place], [place]  [address] Phone: [phone]; Fax: [phone]

## 2016-07-08 ENCOUNTER — Ambulatory Visit (INDEPENDENT_AMBULATORY_CARE_PROVIDER_SITE_OTHER): Payer: Medicare Other | Admitting: Physician Assistant

## 2016-07-08 ENCOUNTER — Encounter: Payer: Self-pay | Admitting: Physician Assistant

## 2016-07-08 ENCOUNTER — Encounter (INDEPENDENT_AMBULATORY_CARE_PROVIDER_SITE_OTHER): Payer: Self-pay

## 2016-07-08 VITALS — BP 130/58 | HR 69 | Ht <= 58 in | Wt 173.0 lb

## 2016-07-08 DIAGNOSIS — N184 Chronic kidney disease, stage 4 (severe): Secondary | ICD-10-CM

## 2016-07-08 DIAGNOSIS — Z950 Presence of cardiac pacemaker: Secondary | ICD-10-CM

## 2016-07-08 DIAGNOSIS — I480 Paroxysmal atrial fibrillation: Secondary | ICD-10-CM

## 2016-07-08 DIAGNOSIS — I5043 Acute on chronic combined systolic (congestive) and diastolic (congestive) heart failure: Secondary | ICD-10-CM

## 2016-07-08 DIAGNOSIS — I10 Essential (primary) hypertension: Secondary | ICD-10-CM | POA: Diagnosis not present

## 2016-07-08 MED ORDER — TORSEMIDE 20 MG PO TABS: 20.0000 mg | ORAL_TABLET | Freq: Every day | ORAL | 1 refills | Status: DC

## 2016-07-08 MED ORDER — AMIODARONE HCL 200 MG PO TABS: 100.0000 mg | ORAL_TABLET | Freq: Every day | ORAL | 3 refills | Status: DC

## 2016-07-08 NOTE — Patient Instructions (Addendum)
Medication Instructions:    START TAKING TORSEMIDE 20 MG  ONCE A DAY   START TAKING HALF OF AMIODARONE  200 MG TABLET (100 MG ) ONE DAY    If you need a refill on your cardiac medications before your next appointment, please call your pharmacy.  Labwork: BMET TODAY   RECHECK  BMET ON Thursday  AT YOUR FACILITY   Testing/Procedures: NONE ORDERED  TODAY    Follow-Up: IN 3 MONTHS WITH DR Excell Seltzer   Any Other Special Instructions Will Be Listed Below (If Applicable).

## 2016-07-09 LAB — BASIC METABOLIC PANEL
BUN/Creatinine Ratio: 20 (ref 12–28)
BUN: 33 mg/dL — AB (ref 8–27)
CALCIUM: 8.9 mg/dL (ref 8.7–10.3)
CO2: 21 mmol/L (ref 18–29)
CREATININE: 1.67 mg/dL — AB (ref 0.57–1.00)
Chloride: 107 mmol/L — ABNORMAL HIGH (ref 96–106)
GFR calc Af Amer: 33 mL/min/{1.73_m2} — ABNORMAL LOW (ref >59)
GFR, EST NON AFRICAN AMERICAN: 28 mL/min/{1.73_m2} — AB (ref >59)
Glucose: 89 mg/dL (ref 65–99)
Potassium: 4.9 mmol/L (ref 3.5–5.2)
SODIUM: 143 mmol/L (ref 134–144)

## 2016-07-14 ENCOUNTER — Inpatient Hospital Stay (HOSPITAL_COMMUNITY)
Admission: EM | Admit: 2016-07-14 | Discharge: 2016-07-17 | DRG: 605 | Disposition: A | Discharge status: Skilled Nursing Facility | Payer: Medicare Other | Attending: Family Medicine | Admitting: Family Medicine

## 2016-07-14 ENCOUNTER — Emergency Department (HOSPITAL_COMMUNITY): Payer: Medicare Other

## 2016-07-14 ENCOUNTER — Encounter (HOSPITAL_COMMUNITY): Payer: Self-pay | Admitting: *Deleted

## 2016-07-14 DIAGNOSIS — I480 Paroxysmal atrial fibrillation: Secondary | ICD-10-CM | POA: Diagnosis present

## 2016-07-14 DIAGNOSIS — Z79899 Other long term (current) drug therapy: Secondary | ICD-10-CM

## 2016-07-14 DIAGNOSIS — W1830XA Fall on same level, unspecified, initial encounter: Secondary | ICD-10-CM | POA: Diagnosis present

## 2016-07-14 DIAGNOSIS — J9601 Acute respiratory failure with hypoxia: Secondary | ICD-10-CM | POA: Diagnosis not present

## 2016-07-14 DIAGNOSIS — R51 Headache: Secondary | ICD-10-CM | POA: Diagnosis not present

## 2016-07-14 DIAGNOSIS — I429 Cardiomyopathy, unspecified: Secondary | ICD-10-CM | POA: Diagnosis present

## 2016-07-14 DIAGNOSIS — I5032 Chronic diastolic (congestive) heart failure: Secondary | ICD-10-CM | POA: Diagnosis not present

## 2016-07-14 DIAGNOSIS — D631 Anemia in chronic kidney disease: Secondary | ICD-10-CM | POA: Diagnosis not present

## 2016-07-14 DIAGNOSIS — I5033 Acute on chronic diastolic (congestive) heart failure: Secondary | ICD-10-CM | POA: Diagnosis present

## 2016-07-14 DIAGNOSIS — Z952 Presence of prosthetic heart valve: Secondary | ICD-10-CM | POA: Diagnosis not present

## 2016-07-14 DIAGNOSIS — J45909 Unspecified asthma, uncomplicated: Secondary | ICD-10-CM | POA: Diagnosis not present

## 2016-07-14 DIAGNOSIS — I959 Hypotension, unspecified: Secondary | ICD-10-CM | POA: Diagnosis not present

## 2016-07-14 DIAGNOSIS — M810 Age-related osteoporosis without current pathological fracture: Secondary | ICD-10-CM | POA: Diagnosis present

## 2016-07-14 DIAGNOSIS — R079 Chest pain, unspecified: Secondary | ICD-10-CM | POA: Diagnosis not present

## 2016-07-14 DIAGNOSIS — M25531 Pain in right wrist: Secondary | ICD-10-CM | POA: Diagnosis not present

## 2016-07-14 DIAGNOSIS — E785 Hyperlipidemia, unspecified: Secondary | ICD-10-CM | POA: Diagnosis not present

## 2016-07-14 DIAGNOSIS — I130 Hypertensive heart and chronic kidney disease with heart failure and stage 1 through stage 4 chronic kidney disease, or unspecified chronic kidney disease: Secondary | ICD-10-CM | POA: Diagnosis present

## 2016-07-14 DIAGNOSIS — I5042 Chronic combined systolic (congestive) and diastolic (congestive) heart failure: Secondary | ICD-10-CM | POA: Diagnosis not present

## 2016-07-14 DIAGNOSIS — H9190 Unspecified hearing loss, unspecified ear: Secondary | ICD-10-CM | POA: Diagnosis present

## 2016-07-14 DIAGNOSIS — S0083XA Contusion of other part of head, initial encounter: Secondary | ICD-10-CM | POA: Diagnosis not present

## 2016-07-14 DIAGNOSIS — K219 Gastro-esophageal reflux disease without esophagitis: Secondary | ICD-10-CM | POA: Diagnosis present

## 2016-07-14 DIAGNOSIS — E039 Hypothyroidism, unspecified: Secondary | ICD-10-CM | POA: Diagnosis not present

## 2016-07-14 DIAGNOSIS — I495 Sick sinus syndrome: Secondary | ICD-10-CM | POA: Diagnosis not present

## 2016-07-14 DIAGNOSIS — S199XXA Unspecified injury of neck, initial encounter: Secondary | ICD-10-CM | POA: Diagnosis not present

## 2016-07-14 DIAGNOSIS — G4730 Sleep apnea, unspecified: Secondary | ICD-10-CM | POA: Diagnosis not present

## 2016-07-14 DIAGNOSIS — G8929 Other chronic pain: Secondary | ICD-10-CM | POA: Diagnosis present

## 2016-07-14 DIAGNOSIS — I2510 Atherosclerotic heart disease of native coronary artery without angina pectoris: Secondary | ICD-10-CM | POA: Diagnosis not present

## 2016-07-14 DIAGNOSIS — Z8673 Personal history of transient ischemic attack (TIA), and cerebral infarction without residual deficits: Secondary | ICD-10-CM

## 2016-07-14 DIAGNOSIS — S0990XA Unspecified injury of head, initial encounter: Secondary | ICD-10-CM | POA: Diagnosis not present

## 2016-07-14 DIAGNOSIS — Z950 Presence of cardiac pacemaker: Secondary | ICD-10-CM | POA: Diagnosis present

## 2016-07-14 DIAGNOSIS — N184 Chronic kidney disease, stage 4 (severe): Secondary | ICD-10-CM | POA: Diagnosis present

## 2016-07-14 DIAGNOSIS — Z820 Family history of epilepsy and other diseases of the nervous system: Secondary | ICD-10-CM

## 2016-07-14 DIAGNOSIS — I10 Essential (primary) hypertension: Secondary | ICD-10-CM

## 2016-07-14 DIAGNOSIS — N183 Chronic kidney disease, stage 3 (moderate): Secondary | ICD-10-CM | POA: Diagnosis not present

## 2016-07-14 DIAGNOSIS — S0993XA Unspecified injury of face, initial encounter: Secondary | ICD-10-CM | POA: Diagnosis not present

## 2016-07-14 DIAGNOSIS — Y92121 Bathroom in nursing home as the place of occurrence of the external cause: Secondary | ICD-10-CM

## 2016-07-14 DIAGNOSIS — M797 Fibromyalgia: Secondary | ICD-10-CM | POA: Diagnosis present

## 2016-07-14 DIAGNOSIS — S098XXA Other specified injuries of head, initial encounter: Secondary | ICD-10-CM | POA: Diagnosis not present

## 2016-07-14 DIAGNOSIS — Z8701 Personal history of pneumonia (recurrent): Secondary | ICD-10-CM

## 2016-07-14 DIAGNOSIS — Z87442 Personal history of urinary calculi: Secondary | ICD-10-CM

## 2016-07-14 DIAGNOSIS — Z87440 Personal history of urinary (tract) infections: Secondary | ICD-10-CM

## 2016-07-14 DIAGNOSIS — Z7901 Long term (current) use of anticoagulants: Secondary | ICD-10-CM

## 2016-07-14 DIAGNOSIS — I5043 Acute on chronic combined systolic (congestive) and diastolic (congestive) heart failure: Secondary | ICD-10-CM | POA: Diagnosis not present

## 2016-07-14 DIAGNOSIS — Z885 Allergy status to narcotic agent status: Secondary | ICD-10-CM

## 2016-07-14 DIAGNOSIS — W19XXXD Unspecified fall, subsequent encounter: Secondary | ICD-10-CM | POA: Diagnosis not present

## 2016-07-14 DIAGNOSIS — R791 Abnormal coagulation profile: Secondary | ICD-10-CM | POA: Diagnosis not present

## 2016-07-14 DIAGNOSIS — S6992XA Unspecified injury of left wrist, hand and finger(s), initial encounter: Secondary | ICD-10-CM | POA: Diagnosis not present

## 2016-07-14 DIAGNOSIS — M542 Cervicalgia: Secondary | ICD-10-CM | POA: Diagnosis not present

## 2016-07-14 DIAGNOSIS — R0902 Hypoxemia: Secondary | ICD-10-CM | POA: Diagnosis not present

## 2016-07-14 DIAGNOSIS — W19XXXA Unspecified fall, initial encounter: Secondary | ICD-10-CM | POA: Diagnosis not present

## 2016-07-14 DIAGNOSIS — D638 Anemia in other chronic diseases classified elsewhere: Secondary | ICD-10-CM | POA: Diagnosis not present

## 2016-07-14 DIAGNOSIS — J9621 Acute and chronic respiratory failure with hypoxia: Secondary | ICD-10-CM | POA: Diagnosis not present

## 2016-07-14 DIAGNOSIS — Z90710 Acquired absence of both cervix and uterus: Secondary | ICD-10-CM

## 2016-07-14 DIAGNOSIS — F0390 Unspecified dementia without behavioral disturbance: Secondary | ICD-10-CM | POA: Diagnosis present

## 2016-07-14 DIAGNOSIS — S40011A Contusion of right shoulder, initial encounter: Secondary | ICD-10-CM | POA: Diagnosis not present

## 2016-07-14 DIAGNOSIS — J9602 Acute respiratory failure with hypercapnia: Secondary | ICD-10-CM | POA: Diagnosis not present

## 2016-07-14 DIAGNOSIS — J189 Pneumonia, unspecified organism: Secondary | ICD-10-CM | POA: Diagnosis not present

## 2016-07-14 DIAGNOSIS — J810 Acute pulmonary edema: Secondary | ICD-10-CM | POA: Diagnosis not present

## 2016-07-14 DIAGNOSIS — S0093XA Contusion of unspecified part of head, initial encounter: Secondary | ICD-10-CM | POA: Diagnosis not present

## 2016-07-14 DIAGNOSIS — M25532 Pain in left wrist: Secondary | ICD-10-CM | POA: Diagnosis not present

## 2016-07-14 DIAGNOSIS — Z86711 Personal history of pulmonary embolism: Secondary | ICD-10-CM

## 2016-07-14 LAB — TYPE AND SCREEN
ABO/RH(D): O POS
ANTIBODY SCREEN: NEGATIVE

## 2016-07-14 LAB — COMPREHENSIVE METABOLIC PANEL
ALT: 14 U/L (ref 14–54)
ANION GAP: 10 (ref 5–15)
AST: 25 U/L (ref 15–41)
Albumin: 3.5 g/dL (ref 3.5–5.0)
Alkaline Phosphatase: 63 U/L (ref 38–126)
BUN: 52 mg/dL — ABNORMAL HIGH (ref 6–20)
CHLORIDE: 105 mmol/L (ref 101–111)
CO2: 27 mmol/L (ref 22–32)
Calcium: 9.3 mg/dL (ref 8.9–10.3)
Creatinine, Ser: 2.34 mg/dL — ABNORMAL HIGH (ref 0.44–1.00)
GFR, EST AFRICAN AMERICAN: 21 mL/min — AB (ref >60)
GFR, EST NON AFRICAN AMERICAN: 18 mL/min — AB (ref >60)
Glucose, Bld: 104 mg/dL — ABNORMAL HIGH (ref 65–99)
Potassium: 4.5 mmol/L (ref 3.5–5.1)
SODIUM: 142 mmol/L (ref 135–145)
Total Bilirubin: 0.6 mg/dL (ref 0.3–1.2)
Total Protein: 6.1 g/dL — ABNORMAL LOW (ref 6.5–8.1)

## 2016-07-14 LAB — CBC WITH DIFFERENTIAL/PLATELET
BASOS PCT: 1 %
Basophils Absolute: 0 10*3/uL (ref 0.0–0.1)
Eosinophils Absolute: 0.2 10*3/uL (ref 0.0–0.7)
Eosinophils Relative: 3 %
HEMATOCRIT: 32.8 % — AB (ref 36.0–46.0)
HEMOGLOBIN: 10.8 g/dL — AB (ref 12.0–15.0)
Lymphocytes Relative: 31 %
Lymphs Abs: 1.6 10*3/uL (ref 0.7–4.0)
MCH: 31.4 pg (ref 26.0–34.0)
MCHC: 32.9 g/dL (ref 30.0–36.0)
MCV: 95.3 fL (ref 78.0–100.0)
MONOS PCT: 12 %
Monocytes Absolute: 0.6 10*3/uL (ref 0.1–1.0)
NEUTROS ABS: 2.9 10*3/uL (ref 1.7–7.7)
NEUTROS PCT: 53 %
Platelets: 227 10*3/uL (ref 150–400)
RBC: 3.44 MIL/uL — AB (ref 3.87–5.11)
RDW: 16.6 % — ABNORMAL HIGH (ref 11.5–15.5)
WBC: 5.3 10*3/uL (ref 4.0–10.5)

## 2016-07-14 LAB — PROTIME-INR
INR: 4.03
PROTHROMBIN TIME: 40.3 s — AB (ref 11.4–15.2)

## 2016-07-14 MED ORDER — LINACLOTIDE 145 MCG PO CAPS
145.0000 ug | ORAL_CAPSULE | Freq: Every day | ORAL | Status: DC
  Administered 2016-07-15 – 2016-07-17 (×3): 145 ug via ORAL
  Filled 2016-07-14 (×3): qty 1

## 2016-07-14 MED ORDER — FEBUXOSTAT 40 MG PO TABS
40.0000 mg | ORAL_TABLET | Freq: Every day | ORAL | Status: DC
  Administered 2016-07-15 – 2016-07-17 (×4): 40 mg via ORAL
  Filled 2016-07-14 (×5): qty 1

## 2016-07-14 MED ORDER — TORSEMIDE 20 MG PO TABS
20.0000 mg | ORAL_TABLET | Freq: Every day | ORAL | Status: DC
  Administered 2016-07-15 – 2016-07-16 (×2): 20 mg via ORAL
  Filled 2016-07-14 (×2): qty 1

## 2016-07-14 MED ORDER — SENNA 8.6 MG PO TABS
2.0000 | ORAL_TABLET | Freq: Two times a day (BID) | ORAL | Status: DC
  Administered 2016-07-15 – 2016-07-17 (×6): 17.2 mg via ORAL
  Filled 2016-07-14 (×7): qty 2

## 2016-07-14 MED ORDER — DULOXETINE HCL 60 MG PO CPEP
60.0000 mg | ORAL_CAPSULE | Freq: Every day | ORAL | Status: DC
  Administered 2016-07-15 – 2016-07-17 (×3): 60 mg via ORAL
  Filled 2016-07-14 (×3): qty 1

## 2016-07-14 MED ORDER — CYCLOSPORINE 0.05 % OP EMUL
1.0000 [drp] | Freq: Two times a day (BID) | OPHTHALMIC | Status: DC
  Administered 2016-07-15 – 2016-07-17 (×5): 1 [drp] via OPHTHALMIC
  Filled 2016-07-14 (×6): qty 1

## 2016-07-14 MED ORDER — BISACODYL 10 MG RE SUPP: 10.0000 mg | Freq: Every day | RECTAL | Status: DC | PRN

## 2016-07-14 MED ORDER — CALCITRIOL 0.25 MCG PO CAPS
0.2500 ug | ORAL_CAPSULE | Freq: Every day | ORAL | Status: DC
  Administered 2016-07-15 – 2016-07-17 (×3): 0.25 ug via ORAL
  Filled 2016-07-14 (×3): qty 1

## 2016-07-14 MED ORDER — DONEPEZIL HCL 10 MG PO TABS
10.0000 mg | ORAL_TABLET | Freq: Every day | ORAL | Status: DC
  Administered 2016-07-15 – 2016-07-16 (×3): 10 mg via ORAL
  Filled 2016-07-14 (×4): qty 1

## 2016-07-14 MED ORDER — HYDRALAZINE HCL 25 MG PO TABS
25.0000 mg | ORAL_TABLET | Freq: Three times a day (TID) | ORAL | Status: DC
  Administered 2016-07-15 (×4): 25 mg via ORAL
  Filled 2016-07-14 (×5): qty 1

## 2016-07-14 MED ORDER — ONDANSETRON HCL 4 MG/2ML IJ SOLN: 4.0000 mg | Freq: Four times a day (QID) | INTRAMUSCULAR | Status: DC | PRN

## 2016-07-14 MED ORDER — HYDROCODONE-ACETAMINOPHEN 7.5-325 MG PO TABS
1.0000 | ORAL_TABLET | ORAL | Status: DC | PRN
  Administered 2016-07-15 (×2): 1 via ORAL
  Administered 2016-07-16: 2 via ORAL
  Administered 2016-07-16: 1 via ORAL
  Administered 2016-07-16: 2 via ORAL
  Administered 2016-07-16 – 2016-07-17 (×2): 1 via ORAL
  Filled 2016-07-14: qty 1
  Filled 2016-07-14: qty 2
  Filled 2016-07-14 (×2): qty 1
  Filled 2016-07-14: qty 2
  Filled 2016-07-14 (×2): qty 1

## 2016-07-14 MED ORDER — TURMERIC 500 MG PO CAPS: 500.0000 mg | ORAL_CAPSULE | Freq: Two times a day (BID) | ORAL | Status: DC

## 2016-07-14 MED ORDER — IPRATROPIUM-ALBUTEROL 0.5-2.5 (3) MG/3ML IN SOLN: 3.0000 mL | RESPIRATORY_TRACT | Status: DC | PRN

## 2016-07-14 MED ORDER — MEMANTINE HCL 5 MG PO TABS
5.0000 mg | ORAL_TABLET | Freq: Two times a day (BID) | ORAL | Status: DC
  Administered 2016-07-15 – 2016-07-17 (×6): 5 mg via ORAL
  Filled 2016-07-14 (×8): qty 1

## 2016-07-14 MED ORDER — PANTOPRAZOLE SODIUM 40 MG PO TBEC
40.0000 mg | DELAYED_RELEASE_TABLET | Freq: Every day | ORAL | Status: DC
  Administered 2016-07-15 – 2016-07-17 (×3): 40 mg via ORAL
  Filled 2016-07-14 (×3): qty 1

## 2016-07-14 MED ORDER — SODIUM CHLORIDE 0.9% FLUSH
3.0000 mL | Freq: Two times a day (BID) | INTRAVENOUS | Status: DC
  Administered 2016-07-15 – 2016-07-17 (×6): 3 mL via INTRAVENOUS

## 2016-07-14 MED ORDER — SIMETHICONE 80 MG PO CHEW
80.0000 mg | CHEWABLE_TABLET | Freq: Two times a day (BID) | ORAL | Status: DC | PRN
  Filled 2016-07-14: qty 1

## 2016-07-14 MED ORDER — NITROGLYCERIN 0.4 MG SL SUBL: 0.4000 mg | SUBLINGUAL_TABLET | SUBLINGUAL | Status: DC | PRN

## 2016-07-14 MED ORDER — PRAMOXINE HCL 1 % EX LOTN: 1.0000 "application " | TOPICAL_LOTION | Freq: Every day | CUTANEOUS | Status: DC | PRN

## 2016-07-14 MED ORDER — CAMPHOR-MENTHOL 0.5-0.5 % EX LOTN
TOPICAL_LOTION | Freq: Every day | CUTANEOUS | Status: DC | PRN
  Filled 2016-07-14: qty 222

## 2016-07-14 MED ORDER — MUSCLE RUB 10-15 % EX CREA: TOPICAL_CREAM | CUTANEOUS | Status: DC | PRN

## 2016-07-14 MED ORDER — ATORVASTATIN CALCIUM 10 MG PO TABS
10.0000 mg | ORAL_TABLET | Freq: Every day | ORAL | Status: DC
  Administered 2016-07-15 – 2016-07-17 (×3): 10 mg via ORAL
  Filled 2016-07-14 (×3): qty 1

## 2016-07-14 MED ORDER — LEVOTHYROXINE SODIUM 112 MCG PO TABS
112.0000 ug | ORAL_TABLET | Freq: Every day | ORAL | Status: DC
  Administered 2016-07-15 – 2016-07-17 (×3): 112 ug via ORAL
  Filled 2016-07-14 (×3): qty 1

## 2016-07-14 MED ORDER — ONDANSETRON HCL 4 MG PO TABS
4.0000 mg | ORAL_TABLET | Freq: Four times a day (QID) | ORAL | Status: DC | PRN
  Administered 2016-07-15: 4 mg via ORAL
  Filled 2016-07-14: qty 1

## 2016-07-14 MED ORDER — POLYETHYLENE GLYCOL 3350 17 G PO PACK
17.0000 g | PACK | Freq: Two times a day (BID) | ORAL | Status: DC
  Administered 2016-07-15 (×2): 17 g via ORAL
  Filled 2016-07-14 (×6): qty 1

## 2016-07-14 MED ORDER — ENOXAPARIN SODIUM 40 MG/0.4ML ~~LOC~~ SOLN: 40.0000 mg | SUBCUTANEOUS | Status: DC

## 2016-07-14 MED ORDER — FERROUS SULFATE 325 (65 FE) MG PO TABS
325.0000 mg | ORAL_TABLET | Freq: Every day | ORAL | Status: DC
  Administered 2016-07-15 – 2016-07-17 (×3): 325 mg via ORAL
  Filled 2016-07-14 (×3): qty 1

## 2016-07-14 MED ORDER — PREGABALIN 50 MG PO CAPS
50.0000 mg | ORAL_CAPSULE | ORAL | Status: DC
  Administered 2016-07-15 – 2016-07-17 (×9): 50 mg via ORAL
  Filled 2016-07-14 (×9): qty 1

## 2016-07-14 MED ORDER — LORATADINE 10 MG PO TABS
10.0000 mg | ORAL_TABLET | Freq: Every day | ORAL | Status: DC
  Administered 2016-07-15 – 2016-07-16 (×2): 10 mg via ORAL
  Filled 2016-07-14 (×2): qty 1

## 2016-07-14 MED ORDER — CARVEDILOL 25 MG PO TABS
25.0000 mg | ORAL_TABLET | Freq: Two times a day (BID) | ORAL | Status: DC
  Administered 2016-07-15 – 2016-07-16 (×4): 25 mg via ORAL
  Filled 2016-07-14 (×4): qty 1

## 2016-07-14 MED ORDER — MENTHOL (TOPICAL ANALGESIC) 7.5 % EX PTCH: 3.0000 | MEDICATED_PATCH | CUTANEOUS | Status: DC

## 2016-07-14 MED ORDER — MENTHOL (TOPICAL ANALGESIC) 4 % EX GEL: 1.0000 "application " | Freq: Two times a day (BID) | CUTANEOUS | Status: DC

## 2016-07-14 MED ORDER — ACETAMINOPHEN 650 MG RE SUPP: 650.0000 mg | Freq: Four times a day (QID) | RECTAL | Status: DC | PRN

## 2016-07-14 MED ORDER — ACETAMINOPHEN 325 MG PO TABS: 650.0000 mg | ORAL_TABLET | Freq: Four times a day (QID) | ORAL | Status: DC | PRN

## 2016-07-14 MED ORDER — AMIODARONE HCL 100 MG PO TABS
100.0000 mg | ORAL_TABLET | Freq: Every day | ORAL | Status: DC
  Administered 2016-07-15 – 2016-07-17 (×3): 100 mg via ORAL
  Filled 2016-07-14 (×3): qty 1

## 2016-07-14 NOTE — Progress Notes (Signed)
PHARMACIST - PHYSICIAN ORDER COMMUNICATION  CONCERNING: P&T Medication Policy on Herbal Medications  DESCRIPTION:  This patient's order for:  Turmeric  has been noted.  This product(s) is classified as an "herbal" or natural product. Due to a lack of definitive safety studies or FDA approval, nonstandard manufacturing practices, plus the potential risk of unknown drug-drug interactions while on inpatient medications, the Pharmacy and Therapeutics Committee does not permit the use of "herbal" or natural products of this type within [facility].   ACTION TAKEN: The pharmacy department is unable to verify this order at this time and your patient has been informed of this safety policy. Please reevaluate patient's clinical condition at discharge and address if the herbal or natural product(s) should be resumed at that time.  Christoper Fabian, PharmD, BCPS Clinical pharmacist, pager [phone] 07/14/2016 10:14 PM

## 2016-07-14 NOTE — ED Notes (Signed)
Phlebotomy at bedside.

## 2016-07-14 NOTE — H&P (Signed)
History and Physical    [name] QZR:[id number] DOB: [birth year] DOA: 07/14/2016  Referring MD/NP/PA: Dr. Madilyn Hook PCP: Oneal Grout, MD  Patient coming from: [facility] via EMS  Chief Complaint: Fall  HPI: [name] is a 83 y.o. female with medical history significant of HTN, PAF on Coumadin, diastolic HF, CKD stage IV, aortic stenosis, dementia, and chronic pain; who presents after having an  unwitnessed fall. Patient is somewhat of a poor historian and therefore some of history is obtained from review of records. She states that she was using the bathroom and fell from a standing position. Unclear if patient lost consciousness. Currently complains of pain in her neck, right shoulder, and bilateral wrist. When asked if she normally uses the restroom without assistance patient did not get an answer. At baseline patient utilizes a wheelchair as she knows needing assistance to ambulate. She was noted to have an elevated INR of 6 the other day.   ED Course: On admission to the emergency department patient noted to have an initial negative CT scan of the brain. TRH was called to admit for observation overnight and repeat CT scan of the brain.  Review of Systems: As per HPI otherwise 10 point review of systems negative.   Past Medical History:  Diagnosis Date  . Acute on chronic renal failure [facility])    sees Dr Allena Katz   . Anemia    Acute blood loss  . Aortic regurgitation   . Aortic stenosis 10/13/2012   Low EF, low gradient with severe aortic stenosis confirmed by dobutamine stress echocardiogram s/p TAVR 12/2012  . Asthma   . Atrial fibrillation (HCC)    tachy-brady syndrome with <1% recurrent PAF since pacemaker placement  . Cataracts, bilateral   . Chronic combined systolic and diastolic CHF (congestive heart failure) (HCC)   . Chronic lower back pain   . CKD (chronic kidney disease)   . Coronary artery disease involving native coronary artery of native heart   . Dementia      Without behavioral disturbance  . Dysrhythmia   . Fibromyalgia   . Gastroesophageal reflux disease   . H/O dizziness   . H/O urinary frequency   . H/O: stroke   . Hard of hearing   . Headache    hx of migraines   . Heart murmur   . History of blood transfusion   . History of bronchitis   . History of kidney stones   . History of urinary tract infection   . HLD (hyperlipidemia)   . HTN (hypertension)   . Hypothyroidism   . Neuropathy (HCC)   . Nonischemic cardiomyopathy (HCC)   . On home oxygen therapy    patient uses at nite- 2L- has not used in > 6 months per patient  . Osteoarthritis   . Osteoporosis   . Pneumonia    hx of x 3   . PONV (postoperative nausea and vomiting)   . Presence of permanent cardiac pacemaker   . Pulmonary embolism (HCC)    HISTORY OF, the pt. had a recurrent bilateral pulmonary emboli in 2005, on warfarin therapy and at which time she under went implantation of IVC filter  . Repeated falls   . Rupture of right patellar tendon   . Sciatica   . Shortness of breath dyspnea    with exertion   . Sleep apnea    uses oxygen at night and PRN- not used since > 6 months / DOES NOT USE  C-PAP  . Spinal stenosis   . Stroke (HCC)   . Symptomatic bradycardia   . Symptomatic bradycardia 2012   s/p Medtronic PPM  . Syncope   . Urinary incontinence   . Urinary urgency     Past Surgical History:  Procedure Laterality Date  . ABDOMINAL HYSTERECTOMY    . APPENDECTOMY    . BACK SURGERY    . BUNIONECTOMY    . CARDIAC CATHETERIZATION    . CATARACT EXTRACTION    . CENTRAL VENOUS CATHETER INSERTION Left 12/21/2012   Procedure: INSERTION CENTRAL LINE ADULT;  Surgeon: Tonny Bollman, MD;  Location: [facility] OR;  Service: Open Heart Surgery;  Laterality: Left;  . ELBOW SURGERY     bilat   . INTRAOPERATIVE TRANSESOPHAGEAL ECHOCARDIOGRAM N/A 12/21/2012   Procedure: INTRAOPERATIVE TRANSESOPHAGEAL ECHOCARDIOGRAM;  Surgeon: Tonny Bollman, MD;  Location: [facility] OR;  Service:  Open Heart Surgery;  Laterality: N/A;  . KNEE SURGERY Left   . LEFT AND RIGHT HEART CATHETERIZATION WITH CORONARY ANGIOGRAM N/A 10/18/2012   Procedure: LEFT AND RIGHT HEART CATHETERIZATION WITH CORONARY ANGIOGRAM;  Surgeon: Tonny Bollman, MD;  Location: [facility] CATH LAB;  Service: Cardiovascular;  Laterality: N/A;  . NASAL SEPTUM SURGERY    . NOSE SURGERY     X 2  . ORIF PATELLA Right 03/08/2015   Procedure:  OPEN REDUCTION INTERNAL FIXATION RIGHT  PATELLA TENDON AVULSION;  Surgeon: Durene Romans, MD;  Location: [facility] ORS;  Service: Orthopedics;  Laterality: Right;  . PACEMAKER INSERTION     Medtronic  . PATELLAR TENDON REPAIR Right 06/25/2015   Procedure: RIGHT PATELLA TENDON REVISION/REPAIR;  Surgeon: Durene Romans, MD;  Location: [facility] ORS;  Service: Orthopedics;  Laterality: Right;  . SHOULDER SURGERY     bilat   . THYROIDECTOMY, PARTIAL    . TONSILLECTOMY    . TOTAL KNEE ARTHROPLASTY Right 12/04/2014   Procedure: RIGHT TOTAL  KNEE ARTHROPLASTY;  Surgeon: Durene Romans, MD;  Location: [facility] ORS;  Service: Orthopedics;  Laterality: Right;  . TRANSCATHETER AORTIC VALVE REPLACEMENT, TRANSFEMORAL  12/21/2012   a. 29mm Edwards Sapien XT transcatheter heart valve placed via open left transfemoral approach b. Intra-op TEE: well-seated bioprosthetic aortic valve with mean gradient 2 mmHg, trivial AI, mild MR, EF 30-35%  . TRANSCATHETER AORTIC VALVE REPLACEMENT, TRANSFEMORAL N/A 12/21/2012   Procedure: TRANSCATHETER AORTIC VALVE REPLACEMENT, TRANSFEMORAL;  Surgeon: Tonny Bollman, MD;  Location: [facility] OR;  Service: Open Heart Surgery;  Laterality: N/A;     reports that she has never smoked. She has never used smokeless tobacco. She reports that she does not drink alcohol or use drugs.  Allergies  Allergen Reactions  . Nubain [Nalbuphine Hcl] Hives    Went into cardiac arrest   . Codeine Hives and Nausea Only  . Darvocet [Propoxyphene N-Acetaminophen] Nausea Only    Family History  Problem Relation Age of Onset    . Ovarian cancer Mother     Deceased  . Epilepsy Father     Deceased    Prior to Admission medications   Medication Sig Start Date End Date Taking? Authorizing Provider  alendronate (FOSAMAX) 70 MG/75ML solution Take 70 mg by mouth every Saturday. Take on an empty stomach with a full glass of fluids, remain upright 30 minutes after administration - for bone health   Yes Historical Provider, MD  amiodarone (PACERONE) 100 MG tablet Take 100 mg by mouth daily.   Yes Historical Provider, MD  atorvastatin (LIPITOR) 10 MG tablet Take 10 mg by mouth daily at  6 PM.    Yes Historical Provider, MD  Biotin 5000 MCG CAPS Take 5,000 mcg by mouth daily at 6 PM.   Yes Historical Provider, MD  bisacodyl (DULCOLAX) 10 MG suppository Place 10 mg rectally daily as needed (constipation).    Yes Historical Provider, MD  calcitRIOL (ROCALTROL) 0.25 MCG capsule Take 0.25 mcg by mouth daily.    Yes Historical Provider, MD  carvedilol (COREG) 12.5 MG tablet Take 2 tablets (25 mg total) by mouth 2 (two) times daily. Patient taking differently: Take 25 mg by mouth 2 (two) times daily. Hold for SBP <110 and pulse <60 06/25/16  Yes Zannie Cove, MD  cetirizine (ZYRTEC) 10 MG tablet Take 10 mg by mouth daily.    Yes Historical Provider, MD  cholecalciferol (VITAMIN D) 1000 units tablet Take 2,000 Units by mouth at bedtime.   Yes Historical Provider, MD  cycloSPORINE (RESTASIS) 0.05 % ophthalmic emulsion Place 1 drop into both eyes 2 (two) times daily.    Yes Historical Provider, MD  donepezil (ARICEPT) 10 MG tablet Take 10 mg by mouth at bedtime.    Yes Historical Provider, MD  DULoxetine (CYMBALTA) 60 MG capsule Take 60 mg by mouth daily at 6 PM.    Yes Historical Provider, MD  esomeprazole (NEXIUM) 40 MG capsule Take 40 mg by mouth daily.    Yes Historical Provider, MD  febuxostat (ULORIC) 40 MG tablet Take 40 mg by mouth daily at 6 PM.   Yes Historical Provider, MD  ferrous sulfate 325 (65 FE) MG tablet Take 325 mg by  mouth daily.    Yes Historical Provider, MD  hydrALAZINE (APRESOLINE) 25 MG tablet Take 25 mg by mouth 3 (three) times daily.   Yes Historical Provider, MD  HYDROcodone-acetaminophen (NORCO) 7.5-325 MG tablet Take 1-2 tablets by mouth every 4 (four) hours as needed for moderate pain or severe pain.    Yes Historical Provider, MD  ipratropium-albuterol (DUONEB) 0.5-2.5 (3) MG/3ML SOLN Take 3 mLs by nebulization every 6 (six) hours as needed (wheezing/shortness of breath).   Yes Historical Provider, MD  isosorbide mononitrate (IMDUR) 30 MG 24 hr tablet Take 1 tablet (30 mg total) by mouth daily. 06/27/16  Yes Zannie Cove, MD  levothyroxine (SYNTHROID, LEVOTHROID) 112 MCG tablet Take 112 mcg by mouth daily.    Yes Historical Provider, MD  Linaclotide Karlene Einstein) 145 MCG CAPS capsule Take 145 mcg by mouth daily.    Yes Historical Provider, MD  memantine (NAMENDA) 5 MG tablet Take 5 mg by mouth 2 (two) times daily.    Yes Historical Provider, MD  Menthol (ICY HOT ADVANCED RELIEF) 7.5 % PTCH Apply 3 patches topically See admin instructions. Apply 3 patches daily - one to each knee and one across lower back - for pain   Yes Historical Provider, MD  Menthol, Topical Analgesic, (BIOFREEZE) 4 % GEL Apply 1 application topically 2 (two) times daily. Apply to back of neck, lower back, and right leg    Yes Historical Provider, MD  Multiple Vitamin (MULTIVITAMIN WITH MINERALS) TABS tablet Take 1 tablet by mouth daily.   Yes Historical Provider, MD  nitroGLYCERIN (NITROSTAT) 0.4 MG SL tablet Place 0.4 mg under the tongue every 5 (five) minutes as needed for chest pain (MAX 3 TABLETS).    Yes Historical Provider, MD  polyethylene glycol (MIRALAX / GLYCOLAX) packet Take 17 g by mouth 2 (two) times daily. Patient taking differently: Take 17 g by mouth 2 (two) times daily. Mix in 6-8 oz fluid  and drink 12/07/14  Yes Matthew Babish, PA-C  pramoxine (SARNA SENSITIVE) 1 % LOTN Apply 1 application topically daily as needed  (itching).    Yes Historical Provider, MD  pregabalin (LYRICA) 50 MG capsule Take 1 capsule (50 mg total) by mouth at bedtime. 6AM, 2PM, 10PM Patient taking differently: Take 50 mg by mouth 3 (three) times daily.  06/27/16  Yes Zannie Cove, MD  senna (SENOKOT) 8.6 MG TABS tablet Take 2 tablets by mouth 2 (two) times daily.   Yes Historical Provider, MD  simethicone (MYLICON) 80 MG chewable tablet Chew 80 mg by mouth every 12 (twelve) hours as needed for flatulence.    Yes Historical Provider, MD  torsemide (DEMADEX) 20 MG tablet Take 1 tablet (20 mg total) by mouth daily. 07/08/16 10/06/16 Yes Bhavinkumar Bhagat, PA  Turmeric 500 MG CAPS Take 500 mg by mouth 2 (two) times daily.    Yes Historical Provider, MD  vitamin B-12 (CYANOCOBALAMIN) 1000 MCG tablet Take 1,000 mcg by mouth 2 (two) times a week. Tuesday and Friday   Yes Historical Provider, MD  warfarin (COUMADIN) 4 MG tablet Take 4 mg by mouth daily at 6 PM.   Yes Historical Provider, MD  amiodarone (PACERONE) 200 MG tablet Take 0.5 tablets (100 mg total) by mouth daily. Patient not taking: Reported on 07/14/2016 07/08/16   Manson Passey, PA    Physical Exam:   Constitutional: Elderly female who appears to be in no acute distress. Vitals:   07/14/16 1930 07/14/16 1945 07/14/16 2000 07/14/16 2015  BP: (!) 130/53 (!) 129/53 (!) 125/52 (!) 118/55  Pulse: 62 61 62 (!) 59  Resp: 16 14 15 17   Temp:      TempSrc:      SpO2: 95% 93% 93% 93%   Eyes: PERRL, lids and conjunctivae normal ENMT: Mucous membranes are moist. Posterior pharynx clear of any exudate or lesions. Neck: normal, supple, no masses, no thyromegaly Respiratory: clear to auscultation bilaterally, no wheezing, no crackles. Normal respiratory effort. No accessory muscle use.  Cardiovascular: Regular rate and rhythm, no murmurs / rubs / gallops. +1 pitting lower extremity edema. 2+ pedal pulses. No carotid bruits.  Abdomen: no tenderness, no masses palpated. No  hepatosplenomegaly. Bowel sounds positive.  Musculoskeletal: no clubbing / cyanosis. Good ROM, no contractures. Normal muscle tone.  Skin: Bruising of the right temple present Neurologic: CN 2-12 grossly intact. Sensation intact, DTR normal. Strength 5/5 in all 4.  Psychiatric: Normal judgment and insight. Alert and oriented x 3. Normal mood.     Labs on Admission: I have personally reviewed following labs and imaging studies  CBC:  Recent Labs Lab 07/14/16 1823  WBC 5.3  NEUTROABS 2.9  HGB 10.8*  HCT 32.8*  MCV 95.3  PLT 227   Basic Metabolic Panel:  Recent Labs Lab 07/08/16 1227 07/14/16 1823  NA 143 142  K 4.9 4.5  CL 107* 105  CO2 21 27  GLUCOSE 89 104*  BUN 33* 52*  CREATININE 1.67* 2.34*  CALCIUM 8.9 9.3   GFR: Estimated Creatinine Clearance: 16.4 mL/min (A) (by C-G formula based on SCr of 2.34 mg/dL (H)). Liver Function Tests:  Recent Labs Lab 07/14/16 1823  AST 25  ALT 14  ALKPHOS 63  BILITOT 0.6  PROT 6.1*  ALBUMIN 3.5   No results for input(s): LIPASE, AMYLASE in the last 168 hours. No results for input(s): AMMONIA in the last 168 hours. Coagulation Profile:  Recent Labs Lab 07/14/16 1823  INR 4.03*  Cardiac Enzymes: No results for input(s): CKTOTAL, CKMB, CKMBINDEX, TROPONINI in the last 168 hours. BNP (last 3 results) No results for input(s): PROBNP in the last 8760 hours. HbA1C: No results for input(s): HGBA1C in the last 72 hours. CBG: No results for input(s): GLUCAP in the last 168 hours. Lipid Profile: No results for input(s): CHOL, HDL, LDLCALC, TRIG, CHOLHDL, LDLDIRECT in the last 72 hours. Thyroid Function Tests: No results for input(s): TSH, T4TOTAL, FREET4, T3FREE, THYROIDAB in the last 72 hours. Anemia Panel: No results for input(s): VITAMINB12, FOLATE, FERRITIN, TIBC, IRON, RETICCTPCT in the last 72 hours. Urine analysis:    Component Value Date/Time   COLORURINE YELLOW 06/19/2015 1339   APPEARANCEUR CLEAR  06/19/2015 1339   LABSPEC 1.007 06/19/2015 1339   PHURINE 6.0 06/19/2015 1339   GLUCOSEU NEGATIVE 06/19/2015 1339   HGBUR NEGATIVE 06/19/2015 1339   BILIRUBINUR NEGATIVE 06/19/2015 1339   KETONESUR NEGATIVE 06/19/2015 1339   PROTEINUR NEGATIVE 06/19/2015 1339   UROBILINOGEN 0.2 11/29/2014 1411   NITRITE NEGATIVE 06/19/2015 1339   LEUKOCYTESUR NEGATIVE 06/19/2015 1339   Sepsis Labs: No results found for this or any previous visit (from the past 240 hour(s)).   Radiological Exams on Admission: Dg Chest 1 View  Result Date: 07/14/2016 CLINICAL DATA:  Status post fall, with right-sided chest pain and right shoulder bruising. Initial encounter. EXAM: CHEST 1 VIEW COMPARISON:  Chest radiograph performed 06/25/2016 FINDINGS: The lungs are mildly hypoexpanded, with minimal left basilar atelectasis. Mild peribronchial thickening is noted. There is no evidence of pleural effusion or pneumothorax. The cardiomediastinal silhouette is mildly enlarged. A pacemaker is noted overlying the right chest wall, with leads ending overlying the right atrium and right ventricle. A valve replacement is noted. No acute osseous abnormalities are seen. IMPRESSION: 1. No displaced rib fracture seen. 2. Lungs mildly hypoexpanded, with minimal left basilar atelectasis. Mild peribronchial thickening seen. 3. Mild cardiomegaly. Electronically Signed   By: Roanna Raider M.D.   On: 07/14/2016 18:27   Dg Shoulder Right  Result Date: 07/14/2016 CLINICAL DATA:  Status post fall from standing position, with right shoulder bruising. Initial encounter. EXAM: RIGHT SHOULDER - 2+ VIEW COMPARISON:  None. FINDINGS: There is no evidence of fracture or dislocation. The right humeral head is seated within the glenoid fossa. Minimal degenerative change is noted about the glenohumeral joint. Degenerative change is seen at the right acromioclavicular joint. There is calcification of the right rotator cuff. A right-sided pacemaker is noted. The  visualized portions of the right lung are grossly clear. IMPRESSION: 1. No evidence of fracture or dislocation. 2. Degenerative change at the right acromioclavicular joint. 3. Calcification at the right rotator cuff. Electronically Signed   By: Roanna Raider M.D.   On: 07/14/2016 18:34   Dg Wrist Complete Left  Result Date: 07/14/2016 CLINICAL DATA:  Had unwitnessed fall today. Not alert enough to localize her pain but specified some anterior wrist pain on left wrist and some lateral wrist pain on right wrist. EXAM: LEFT WRIST - COMPLETE 3+ VIEW COMPARISON:  None. FINDINGS: Generalized osteopenia. No acute fracture or dislocation. Mild radiocarpal osteoarthritis. Chondrocalcinosis of the TFCC as can be seen with CPPD. Mild osteoarthritis of the first [facility] joint and first MCP joint. IMPRESSION: No acute osseous injury of the left wrist. Electronically Signed   By: Elige Ko   On: 07/14/2016 21:11   Dg Wrist Complete Right  Result Date: 07/14/2016 CLINICAL DATA:  Status post fall, right wrist pain EXAM: RIGHT WRIST - COMPLETE 3+  VIEW COMPARISON:  None. FINDINGS: Generalized osteopenia. No acute fracture or dislocation. Mild osteoarthritis of the first CMC joint. Moderate osteoarthritis of the first MCP joint. Chondrocalcinosis of the right TFCC as can be seen with CPPD. IMPRESSION: No acute osseous injury of the right wrist. Electronically Signed   By: Elige Ko   On: 07/14/2016 21:13   Ct Head Wo Contrast  Result Date: 07/14/2016 CLINICAL DATA:  Larey Seat at home today, large bump on LEFT side of head, on blood thinners EXAM: CT HEAD WITHOUT CONTRAST CT CERVICAL SPINE WITHOUT CONTRAST TECHNIQUE: Multidetector CT imaging of the head and cervical spine was performed following the standard protocol without intravenous contrast. Multiplanar CT image reconstructions of the cervical spine were also generated. COMPARISON:  CT head 05/24/2013 FINDINGS: CT HEAD FINDINGS Brain: Generalized atrophy. Normal  ventricular morphology. No midline shift or mass effect. Old LEFT basal ganglia lacunar infarct. No intracranial hemorrhage, mass lesion or evidence acute infarction. No extra-axial fluid collections. Vascular: Atherosclerotic calcifications at the carotid bifurcations. Skull: Intact.  LEFT frontal scalp hematoma noted Sinuses/Orbits: Clear visualized mastoid air cells and paranasal sinuses Other: N/A CT CERVICAL SPINE FINDINGS Alignment: Minimal retrolisthesis at C3-C4 and C4-C5 likely degenerative. Remaining alignments normal. Skull base and vertebrae: Osseous demineralization. Vertebral body heights maintained. No fracture or bone destruction. Mild scattered facet degenerative changes. Visualized skullbase intact. Moderate calcified pannus adjacent to the odontoid process. Soft tissues and spinal canal: Prevertebral soft tissues normal thickness. Dense atherosclerotic calcifications at the carotid bifurcations and at the proximal great vessels. Spinal canal grossly patent. Disc levels: Multilevel disc space narrowing and endplate spur formation C3-C4 through C6-C7. No obvious disc herniation. Upper chest: Minimal infiltrate or scarring at RIGHT apex. Other: RIGHT subclavian pacemaker leads noted. IMPRESSION: Generalized atrophy. Old LEFT basal ganglia lacunar infarct. No acute intracranial abnormalities. Degenerative disc and facet disease changes cervical spine. No acute cervical spine abnormalities. Minimal infiltrate versus scarring at RIGHT apex. Atherosclerotic calcifications throughout the carotid systems bilaterally. Electronically Signed   By: Ulyses Southward M.D.   On: 07/14/2016 17:46   Ct Cervical Spine Wo Contrast  Result Date: 07/14/2016 CLINICAL DATA:  Larey Seat at home today, large bump on LEFT side of head, on blood thinners EXAM: CT HEAD WITHOUT CONTRAST CT CERVICAL SPINE WITHOUT CONTRAST TECHNIQUE: Multidetector CT imaging of the head and cervical spine was performed following the standard protocol  without intravenous contrast. Multiplanar CT image reconstructions of the cervical spine were also generated. COMPARISON:  CT head 05/24/2013 FINDINGS: CT HEAD FINDINGS Brain: Generalized atrophy. Normal ventricular morphology. No midline shift or mass effect. Old LEFT basal ganglia lacunar infarct. No intracranial hemorrhage, mass lesion or evidence acute infarction. No extra-axial fluid collections. Vascular: Atherosclerotic calcifications at the carotid bifurcations. Skull: Intact.  LEFT frontal scalp hematoma noted Sinuses/Orbits: Clear visualized mastoid air cells and paranasal sinuses Other: N/A CT CERVICAL SPINE FINDINGS Alignment: Minimal retrolisthesis at C3-C4 and C4-C5 likely degenerative. Remaining alignments normal. Skull base and vertebrae: Osseous demineralization. Vertebral body heights maintained. No fracture or bone destruction. Mild scattered facet degenerative changes. Visualized skullbase intact. Moderate calcified pannus adjacent to the odontoid process. Soft tissues and spinal canal: Prevertebral soft tissues normal thickness. Dense atherosclerotic calcifications at the carotid bifurcations and at the proximal great vessels. Spinal canal grossly patent. Disc levels: Multilevel disc space narrowing and endplate spur formation C3-C4 through C6-C7. No obvious disc herniation. Upper chest: Minimal infiltrate or scarring at RIGHT apex. Other: RIGHT subclavian pacemaker leads noted. IMPRESSION: Generalized atrophy. Old LEFT basal  ganglia lacunar infarct. No acute intracranial abnormalities. Degenerative disc and facet disease changes cervical spine. No acute cervical spine abnormalities. Minimal infiltrate versus scarring at RIGHT apex. Atherosclerotic calcifications throughout the carotid systems bilaterally. Electronically Signed   By: Ulyses Southward M.D.   On: 07/14/2016 17:46    EKG: Independently reviewed. Possible junctional rhythm with left bundle branch block  Assessment/Plan Fall with  head contusion: Acute. Patient noted to have an unwitnessed fall in the bathroom at the nursing facility. - Admit to a telemetry bed  - Neuro checks every 4 hours - Recheck CT scan of head in a.m. for possible late bleed  Paroxysmal atrial fibrillationwith supratherapeutic INR: chadsvasc score =6. INR was previously 6 and has trended down to 4. - Hold Coumadin, and thereafter pharmacy to dose - Continue amiodarone  Chronic diastolic heart failure/ CAD/ HO of TAVR/  s/p PM   - Strict I&Os and daily weights - Continue Coreg , hydralazine, torsemide, and imdur  Essential hypertension - Continue medications as seen above  Hypothyroidism - Continue Synthroid  Anemia of chronic kidney disease: Stable. Hemoglobin 10.8 on admission - Repeat CBC in a.m.  Chronic kidney disease stage IV: Chronic. Creatinine 2.34 on admission which appears to patient's baseline. - Continue to monitor   Dyslipidemia  - Continue Lipitor  Chronic pain  - continue Lyrica, Norco prn    Dementia without behavioral disturbance - Continue Namenda and Aricept  DVT prophylaxis: On Coumadin  Code Status: Full  Family Communication: No family present at bedside  Disposition Plan: Likely discharge back to nursing home facility once medically stable  Consults called: None  Admission status: Observation  Clydie Braun MD [facility] Pager [phone]  If 7PM-7AM, please contact night-coverage [email or web address] Password [email or web address]  07/14/2016, 9:16 PM

## 2016-07-14 NOTE — ED Notes (Signed)
Called xray and informed them pt is ready for transport.  

## 2016-07-14 NOTE — ED Notes (Signed)
Attempted to call report x 1 to [address] [place].  

## 2016-07-14 NOTE — ED Triage Notes (Signed)
Pt arrives from [facility] via [facility]. Pt had an unwitnessed fall today and is unclear on the details, possible LOC can't be ruled out. Pt last INR was 6 and was supposed to begin weaning off of coumadin starting tomorrow. EMS reports pt had a change in the clarity of her speech en route.

## 2016-07-14 NOTE — ED Provider Notes (Signed)
[facility]-EMERGENCY DEPT Provider Note   CSN: [id number] Arrival date & time: 07/14/16  1708     History   Chief Complaint Chief Complaint  Patient presents with  . Fall    HPI [name] is a 83 y.o. female.  The history is provided by the patient. No language interpreter was used.  Fall     [name] is a 83 y.o. female who presents to the Emergency Department complaining of fall.  She presents from EMS for evaluation of injuries following a fall.  She resides at [facility] and had a fall today of unclear cause. She struck her head and her right shoulder. Unknown if there is loss of consciousness.  Symptoms are moderate and constant in nature.  Past Medical History:  Diagnosis Date  . Acute on chronic renal failure [facility])    sees Dr Allena Katz   . Anemia    Acute blood loss  . Aortic regurgitation   . Aortic stenosis 10/13/2012   Low EF, low gradient with severe aortic stenosis confirmed by dobutamine stress echocardiogram s/p TAVR 12/2012  . Asthma   . Atrial fibrillation (HCC)    tachy-brady syndrome with <1% recurrent PAF since pacemaker placement  . Cataracts, bilateral   . Chronic combined systolic and diastolic CHF (congestive heart failure) (HCC)   . Chronic lower back pain   . CKD (chronic kidney disease)   . Coronary artery disease involving native coronary artery of native heart   . Dementia    Without behavioral disturbance  . Dysrhythmia   . Fibromyalgia   . Gastroesophageal reflux disease   . H/O dizziness   . H/O urinary frequency   . H/O: stroke   . Hard of hearing   . Headache    hx of migraines   . Heart murmur   . History of blood transfusion   . History of bronchitis   . History of kidney stones   . History of urinary tract infection   . HLD (hyperlipidemia)   . HTN (hypertension)   . Hypothyroidism   . Neuropathy (HCC)   . Nonischemic cardiomyopathy (HCC)   . On home oxygen therapy    patient uses at nite- 2L- has not used in > 6  months per patient  . Osteoarthritis   . Osteoporosis   . Pneumonia    hx of x 3   . PONV (postoperative nausea and vomiting)   . Presence of permanent cardiac pacemaker   . Pulmonary embolism (HCC)    HISTORY OF, the pt. had a recurrent bilateral pulmonary emboli in 2005, on warfarin therapy and at which time she under went implantation of IVC filter  . Repeated falls   . Rupture of right patellar tendon   . Sciatica   . Shortness of breath dyspnea    with exertion   . Sleep apnea    uses oxygen at night and PRN- not used since > 6 months / DOES NOT USE  C-PAP  . Spinal stenosis   . Stroke (HCC)   . Symptomatic bradycardia   . Symptomatic bradycardia 2012   s/p Medtronic PPM  . Syncope   . Urinary incontinence   . Urinary urgency     Patient Active Problem List   Diagnosis Date Noted  . Fall 07/14/2016  . Chest pain at rest   . Acute on chronic combined systolic and diastolic congestive heart failure (HCC)   . Unstable angina (HCC)   . Acute  respiratory failure (HCC) 06/13/2016  . Elevated blood sugar 06/13/2016  . Dementia without behavioral disturbance 01/09/2016  . Chronic constipation 01/09/2016  . Neuropathic pain 01/09/2016  . Esophageal reflux 01/09/2016  . Hypothyroidism 01/09/2016  . CKD (chronic kidney disease) stage 4, GFR 15-29 ml/min (HCC) 01/09/2016  . Right patellar tendon rupture 06/25/2015  . Avulsion of right patellar tendon 03/08/2015  . Aspiration pneumonia (HCC) 12/14/2014  . Anemia of chronic disease 12/14/2014  . Aspiration into airway 12/14/2014  . Swallowing dysfunction 12/14/2014  . S/P right TKA 12/04/2014  . S/P knee replacement 12/04/2014  . Acute blood loss anemia 12/28/2012  . Thrombocytopenia (HCC) 12/28/2012  . Toe fracture 12/28/2012  . CKD (chronic kidney disease), stage III 12/28/2012  . Low back pain 12/28/2012  . Wound dehiscence, surgical 12/28/2012  . Atelectasis 12/28/2012  . S/P TAVR (transcatheter aortic valve  replacement) 12/21/2012  . Severe aortic stenosis 12/06/2012  . Aortic stenosis 10/13/2012  . Acute on chronic systolic and diastolic heart failure, NYHA class 3 (HCC) 10/13/2012  . Pacemaker 10/15/2010  . Paroxysmal atrial fibrillation (HCC) 10/15/2010  . Dyslipidemia 10/15/2010  . Hypertension 10/15/2010  . Chronic diastolic heart failure (HCC) 10/15/2010    Past Surgical History:  Procedure Laterality Date  . ABDOMINAL HYSTERECTOMY    . APPENDECTOMY    . BACK SURGERY    . BUNIONECTOMY    . CARDIAC CATHETERIZATION    . CATARACT EXTRACTION    . CENTRAL VENOUS CATHETER INSERTION Left 12/21/2012   Procedure: INSERTION CENTRAL LINE ADULT;  Surgeon: Tonny Bollman, MD;  Location: [facility] OR;  Service: Open Heart Surgery;  Laterality: Left;  . ELBOW SURGERY     bilat   . INTRAOPERATIVE TRANSESOPHAGEAL ECHOCARDIOGRAM N/A 12/21/2012   Procedure: INTRAOPERATIVE TRANSESOPHAGEAL ECHOCARDIOGRAM;  Surgeon: Tonny Bollman, MD;  Location: [facility] OR;  Service: Open Heart Surgery;  Laterality: N/A;  . KNEE SURGERY Left   . LEFT AND RIGHT HEART CATHETERIZATION WITH CORONARY ANGIOGRAM N/A 10/18/2012   Procedure: LEFT AND RIGHT HEART CATHETERIZATION WITH CORONARY ANGIOGRAM;  Surgeon: Tonny Bollman, MD;  Location: [facility] CATH LAB;  Service: Cardiovascular;  Laterality: N/A;  . NASAL SEPTUM SURGERY    . NOSE SURGERY     X 2  . ORIF PATELLA Right 03/08/2015   Procedure:  OPEN REDUCTION INTERNAL FIXATION RIGHT  PATELLA TENDON AVULSION;  Surgeon: Durene Romans, MD;  Location: [facility] ORS;  Service: Orthopedics;  Laterality: Right;  . PACEMAKER INSERTION     Medtronic  . PATELLAR TENDON REPAIR Right 06/25/2015   Procedure: RIGHT PATELLA TENDON REVISION/REPAIR;  Surgeon: Durene Romans, MD;  Location: [facility] ORS;  Service: Orthopedics;  Laterality: Right;  . SHOULDER SURGERY     bilat   . THYROIDECTOMY, PARTIAL    . TONSILLECTOMY    . TOTAL KNEE ARTHROPLASTY Right 12/04/2014   Procedure: RIGHT TOTAL  KNEE ARTHROPLASTY;  Surgeon:  Durene Romans, MD;  Location: [facility] ORS;  Service: Orthopedics;  Laterality: Right;  . TRANSCATHETER AORTIC VALVE REPLACEMENT, TRANSFEMORAL  12/21/2012   a. 29mm Edwards Sapien XT transcatheter heart valve placed via open left transfemoral approach b. Intra-op TEE: well-seated bioprosthetic aortic valve with mean gradient 2 mmHg, trivial AI, mild MR, EF 30-35%  . TRANSCATHETER AORTIC VALVE REPLACEMENT, TRANSFEMORAL N/A 12/21/2012   Procedure: TRANSCATHETER AORTIC VALVE REPLACEMENT, TRANSFEMORAL;  Surgeon: Tonny Bollman, MD;  Location: [facility] OR;  Service: Open Heart Surgery;  Laterality: N/A;    OB History    No data available  Home Medications    Prior to Admission medications   Medication Sig Start Date End Date Taking? Authorizing Provider  alendronate (FOSAMAX) 70 MG/75ML solution Take 70 mg by mouth every Saturday. Take on an empty stomach with a full glass of fluids, remain upright 30 minutes after administration - for bone health   Yes Historical Provider, MD  amiodarone (PACERONE) 100 MG tablet Take 100 mg by mouth daily.   Yes Historical Provider, MD  atorvastatin (LIPITOR) 10 MG tablet Take 10 mg by mouth daily at 6 PM.    Yes Historical Provider, MD  Biotin 5000 MCG CAPS Take 5,000 mcg by mouth daily at 6 PM.   Yes Historical Provider, MD  bisacodyl (DULCOLAX) 10 MG suppository Place 10 mg rectally daily as needed (constipation).    Yes Historical Provider, MD  calcitRIOL (ROCALTROL) 0.25 MCG capsule Take 0.25 mcg by mouth daily.    Yes Historical Provider, MD  carvedilol (COREG) 12.5 MG tablet Take 2 tablets (25 mg total) by mouth 2 (two) times daily. Patient taking differently: Take 25 mg by mouth 2 (two) times daily. Hold for SBP <110 and pulse <60 06/25/16  Yes Zannie Cove, MD  cetirizine (ZYRTEC) 10 MG tablet Take 10 mg by mouth daily.    Yes Historical Provider, MD  cholecalciferol (VITAMIN D) 1000 units tablet Take 2,000 Units by mouth at bedtime.   Yes Historical Provider, MD    cycloSPORINE (RESTASIS) 0.05 % ophthalmic emulsion Place 1 drop into both eyes 2 (two) times daily.    Yes Historical Provider, MD  donepezil (ARICEPT) 10 MG tablet Take 10 mg by mouth at bedtime.    Yes Historical Provider, MD  DULoxetine (CYMBALTA) 60 MG capsule Take 60 mg by mouth daily at 6 PM.    Yes Historical Provider, MD  esomeprazole (NEXIUM) 40 MG capsule Take 40 mg by mouth daily.    Yes Historical Provider, MD  febuxostat (ULORIC) 40 MG tablet Take 40 mg by mouth daily at 6 PM.   Yes Historical Provider, MD  ferrous sulfate 325 (65 FE) MG tablet Take 325 mg by mouth daily.    Yes Historical Provider, MD  hydrALAZINE (APRESOLINE) 25 MG tablet Take 25 mg by mouth 3 (three) times daily.   Yes Historical Provider, MD  HYDROcodone-acetaminophen (NORCO) 7.5-325 MG tablet Take 1-2 tablets by mouth every 4 (four) hours as needed for moderate pain or severe pain.    Yes Historical Provider, MD  ipratropium-albuterol (DUONEB) 0.5-2.5 (3) MG/3ML SOLN Take 3 mLs by nebulization every 6 (six) hours as needed (wheezing/shortness of breath).   Yes Historical Provider, MD  isosorbide mononitrate (IMDUR) 30 MG 24 hr tablet Take 1 tablet (30 mg total) by mouth daily. 06/27/16  Yes Zannie Cove, MD  levothyroxine (SYNTHROID, LEVOTHROID) 112 MCG tablet Take 112 mcg by mouth daily.    Yes Historical Provider, MD  Linaclotide Karlene Einstein) 145 MCG CAPS capsule Take 145 mcg by mouth daily.    Yes Historical Provider, MD  memantine (NAMENDA) 5 MG tablet Take 5 mg by mouth 2 (two) times daily.    Yes Historical Provider, MD  Menthol (ICY HOT ADVANCED RELIEF) 7.5 % PTCH Apply 3 patches topically See admin instructions. Apply 3 patches daily - one to each knee and one across lower back - for pain   Yes Historical Provider, MD  Menthol, Topical Analgesic, (BIOFREEZE) 4 % GEL Apply 1 application topically 2 (two) times daily. Apply to back of neck, lower back, and right leg  Yes Historical Provider, MD  Multiple  Vitamin (MULTIVITAMIN WITH MINERALS) TABS tablet Take 1 tablet by mouth daily.   Yes Historical Provider, MD  nitroGLYCERIN (NITROSTAT) 0.4 MG SL tablet Place 0.4 mg under the tongue every 5 (five) minutes as needed for chest pain (MAX 3 TABLETS).    Yes Historical Provider, MD  polyethylene glycol (MIRALAX / GLYCOLAX) packet Take 17 g by mouth 2 (two) times daily. Patient taking differently: Take 17 g by mouth 2 (two) times daily. Mix in 6-8 oz fluid and drink 12/07/14  Yes Matthew Babish, PA-C  pramoxine [facility] SENSITIVE) 1 % LOTN Apply 1 application topically daily as needed (itching).    Yes Historical Provider, MD  pregabalin (LYRICA) 50 MG capsule Take 1 capsule (50 mg total) by mouth at bedtime. 6AM, 2PM, 10PM Patient taking differently: Take 50 mg by mouth 3 (three) times daily.  06/27/16  Yes Zannie Cove, MD  senna (SENOKOT) 8.6 MG TABS tablet Take 2 tablets by mouth 2 (two) times daily.   Yes Historical Provider, MD  simethicone (MYLICON) 80 MG chewable tablet Chew 80 mg by mouth every 12 (twelve) hours as needed for flatulence.    Yes Historical Provider, MD  torsemide (DEMADEX) 20 MG tablet Take 1 tablet (20 mg total) by mouth daily. 07/08/16 10/06/16 Yes Bhavinkumar Bhagat, PA  Turmeric 500 MG CAPS Take 500 mg by mouth 2 (two) times daily.    Yes Historical Provider, MD  vitamin B-12 (CYANOCOBALAMIN) 1000 MCG tablet Take 1,000 mcg by mouth 2 (two) times a week. Tuesday and Friday   Yes Historical Provider, MD  warfarin (COUMADIN) 4 MG tablet Take 4 mg by mouth daily at 6 PM.   Yes Historical Provider, MD  amiodarone (PACERONE) 200 MG tablet Take 0.5 tablets (100 mg total) by mouth daily. Patient not taking: Reported on 07/14/2016 07/08/16   Manson Passey, PA    Family History Family History  Problem Relation Age of Onset  . Ovarian cancer Mother     Deceased  . Epilepsy Father     Deceased    Social History Social History  Substance Use Topics  . Smoking status: Never  Smoker  . Smokeless tobacco: Never Used  . Alcohol use No     Allergies   Nubain [nalbuphine hcl]; Codeine; and Darvocet [propoxyphene n-acetaminophen]   Review of Systems Review of Systems  All other systems reviewed and are negative.    Physical Exam Updated Vital Signs BP (!) 136/58 (BP Location: Left Arm)   Pulse 62   Temp 98.2 F (36.8 C) (Oral)   Resp 20   Ht 4\' 10"  (1.473 m)   Wt 181 lb 7 oz (82.3 kg)   SpO2 97%   BMI 37.92 kg/m   Physical Exam  Constitutional: She is oriented to person, place, and time. She appears well-developed and well-nourished.  HENT:  Head: Normocephalic.  Ecchymosis and swelling to the left forehead and the right temple.  Eyes: Pupils are equal, round, and reactive to light.  Cardiovascular: Normal rate and regular rhythm.   No murmur heard. Pulmonary/Chest: Effort normal and breath sounds normal. No respiratory distress.  Abdominal: Soft. There is no tenderness. There is no rebound and no guarding.  Musculoskeletal: She exhibits no tenderness.  2+ pitting edema bilaterally  Neurological: She is alert and oriented to person, place, and time.  4-5 strength in the right upper extremity and 3 out of 5 strength in the right lower extremity. 5 out of 5 strength  in the left upper and left lower extremities.  Skin: Skin is warm and dry.  Psychiatric: She has a normal mood and affect. Her behavior is normal.  Nursing note and vitals reviewed.    ED Treatments / Results  Labs (all labs ordered are listed, but only abnormal results are displayed) Labs Reviewed  COMPREHENSIVE METABOLIC PANEL - Abnormal; Notable for the following:       Result Value   Glucose, Bld 104 (*)    BUN 52 (*)    Creatinine, Ser 2.34 (*)    Total Protein 6.1 (*)    GFR calc non Af Amer 18 (*)    GFR calc Af Amer 21 (*)    All other components within normal limits  CBC WITH DIFFERENTIAL/PLATELET - Abnormal; Notable for the following:    RBC 3.44 (*)     Hemoglobin 10.8 (*)    HCT 32.8 (*)    RDW 16.6 (*)    All other components within normal limits  PROTIME-INR - Abnormal; Notable for the following:    Prothrombin Time 40.3 (*)    INR 4.03 (*)    All other components within normal limits  MRSA PCR SCREENING  URINALYSIS, ROUTINE W REFLEX MICROSCOPIC  CBC  BASIC METABOLIC PANEL  PROTIME-INR  TYPE AND SCREEN    EKG  EKG Interpretation None       Radiology Dg Chest 1 View  Result Date: 07/14/2016 CLINICAL DATA:  Status post fall, with right-sided chest pain and right shoulder bruising. Initial encounter. EXAM: CHEST 1 VIEW COMPARISON:  Chest radiograph performed 06/25/2016 FINDINGS: The lungs are mildly hypoexpanded, with minimal left basilar atelectasis. Mild peribronchial thickening is noted. There is no evidence of pleural effusion or pneumothorax. The cardiomediastinal silhouette is mildly enlarged. A pacemaker is noted overlying the right chest wall, with leads ending overlying the right atrium and right ventricle. A valve replacement is noted. No acute osseous abnormalities are seen. IMPRESSION: 1. No displaced rib fracture seen. 2. Lungs mildly hypoexpanded, with minimal left basilar atelectasis. Mild peribronchial thickening seen. 3. Mild cardiomegaly. Electronically Signed   By: Roanna Raider M.D.   On: 07/14/2016 18:27   Dg Shoulder Right  Result Date: 07/14/2016 CLINICAL DATA:  Status post fall from standing position, with right shoulder bruising. Initial encounter. EXAM: RIGHT SHOULDER - 2+ VIEW COMPARISON:  None. FINDINGS: There is no evidence of fracture or dislocation. The right humeral head is seated within the glenoid fossa. Minimal degenerative change is noted about the glenohumeral joint. Degenerative change is seen at the right acromioclavicular joint. There is calcification of the right rotator cuff. A right-sided pacemaker is noted. The visualized portions of the right lung are grossly clear. IMPRESSION: 1. No  evidence of fracture or dislocation. 2. Degenerative change at the right acromioclavicular joint. 3. Calcification at the right rotator cuff. Electronically Signed   By: Roanna Raider M.D.   On: 07/14/2016 18:34   Dg Wrist Complete Left  Result Date: 07/14/2016 CLINICAL DATA:  Had unwitnessed fall today. Not alert enough to localize her pain but specified some anterior wrist pain on left wrist and some lateral wrist pain on right wrist. EXAM: LEFT WRIST - COMPLETE 3+ VIEW COMPARISON:  None. FINDINGS: Generalized osteopenia. No acute fracture or dislocation. Mild radiocarpal osteoarthritis. Chondrocalcinosis of the TFCC as can be seen with CPPD. Mild osteoarthritis of the first [facility] joint and first MCP joint. IMPRESSION: No acute osseous injury of the left wrist. Electronically Signed   By: Alan Ripper  Patel   On: 07/14/2016 21:11   Dg Wrist Complete Right  Result Date: 07/14/2016 CLINICAL DATA:  Status post fall, right wrist pain EXAM: RIGHT WRIST - COMPLETE 3+ VIEW COMPARISON:  None. FINDINGS: Generalized osteopenia. No acute fracture or dislocation. Mild osteoarthritis of the first CMC joint. Moderate osteoarthritis of the first MCP joint. Chondrocalcinosis of the right TFCC as can be seen with CPPD. IMPRESSION: No acute osseous injury of the right wrist. Electronically Signed   By: Elige Ko   On: 07/14/2016 21:13   Ct Head Wo Contrast  Result Date: 07/14/2016 CLINICAL DATA:  [facility] Se[facility] at home today, large bump on LEFT side of head, on blood thinners EXAM: CT HEAD WITHOUT CONTRAST CT CERVICAL SPINE WITHOUT CONTRAST TECHNIQUE: Multidetector CT imaging of the head and cervical spine was performed following the standard protocol without intravenous contrast. Multiplanar CT image reconstructions of the cervical spine were also generated. COMPARISON:  CT head 05/24/2013 FINDINGS: CT HEAD FINDINGS Brain: Generalized atrophy. Normal ventricular morphology. No midline shift or mass effect. Old LEFT basal ganglia  lacunar infarct. No intracranial hemorrhage, mass lesion or evidence acute infarction. No extra-axial fluid collections. Vascular: Atherosclerotic calcifications at the carotid bifurcations. Skull: Intact.  LEFT frontal scalp hematoma noted Sinuses/Orbits: Clear visualized mastoid air cells and paranasal sinuses Other: N/A CT CERVICAL SPINE FINDINGS Alignment: Minimal retrolisthesis at C3-C4 and C4-C5 likely degenerative. Remaining alignments normal. Skull base and vertebrae: Osseous demineralization. Vertebral body heights maintained. No fracture or bone destruction. Mild scattered facet degenerative changes. Visualized skullbase intact. Moderate calcified pannus adjacent to the odontoid process. Soft tissues and spinal canal: Prevertebral soft tissues normal thickness. Dense atherosclerotic calcifications at the carotid bifurcations and at the proximal great vessels. Spinal canal grossly patent. Disc levels: Multilevel disc space narrowing and endplate spur formation C3-C4 through C6-C7. No obvious disc herniation. Upper chest: Minimal infiltrate or scarring at RIGHT apex. Other: RIGHT subclavian pacemaker leads noted. IMPRESSION: Generalized atrophy. Old LEFT basal ganglia lacunar infarct. No acute intracranial abnormalities. Degenerative disc and facet disease changes cervical spine. No acute cervical spine abnormalities. Minimal infiltrate versus scarring at RIGHT apex. Atherosclerotic calcifications throughout the carotid systems bilaterally. Electronically Signed   By: Ulyses Southward M.D.   On: 07/14/2016 17:46   Ct Cervical Spine Wo Contrast  Result Date: 07/14/2016 CLINICAL DATA:  [place] Se[facility] at home today, large bump on LEFT side of head, on blood thinners EXAM: CT HEAD WITHOUT CONTRAST CT CERVICAL SPINE WITHOUT CONTRAST TECHNIQUE: Multidetector CT imaging of the head and cervical spine was performed following the standard protocol without intravenous contrast. Multiplanar CT image reconstructions of the  cervical spine were also generated. COMPARISON:  CT head 05/24/2013 FINDINGS: CT HEAD FINDINGS Brain: Generalized atrophy. Normal ventricular morphology. No midline shift or mass effect. Old LEFT basal ganglia lacunar infarct. No intracranial hemorrhage, mass lesion or evidence acute infarction. No extra-axial fluid collections. Vascular: Atherosclerotic calcifications at the carotid bifurcations. Skull: Intact.  LEFT frontal scalp hematoma noted Sinuses/Orbits: Clear visualized mastoid air cells and paranasal sinuses Other: N/A CT CERVICAL SPINE FINDINGS Alignment: Minimal retrolisthesis at C3-C4 and C4-C5 likely degenerative. Remaining alignments normal. Skull base and vertebrae: Osseous demineralization. Vertebral body heights maintained. No fracture or bone destruction. Mild scattered facet degenerative changes. Visualized skullbase intact. Moderate calcified pannus adjacent to the odontoid process. Soft tissues and spinal canal: Prevertebral soft tissues normal thickness. Dense atherosclerotic calcifications at the carotid bifurcations and at the proximal great vessels. Spinal canal grossly patent. Disc levels: Multilevel disc space narrowing and  endplate spur formation C3-C4 through C6-C7. No obvious disc herniation. Upper chest: Minimal infiltrate or scarring at RIGHT apex. Other: RIGHT subclavian pacemaker leads noted. IMPRESSION: Generalized atrophy. Old LEFT basal ganglia lacunar infarct. No acute intracranial abnormalities. Degenerative disc and facet disease changes cervical spine. No acute cervical spine abnormalities. Minimal infiltrate versus scarring at RIGHT apex. Atherosclerotic calcifications throughout the carotid systems bilaterally. Electronically Signed   By: Ulyses Southward M.D.   On: 07/14/2016 17:46    Procedures Procedures (including critical care time)  Medications Ordered in ED Medications  amiodarone (PACERONE) tablet 100 mg (not administered)  febuxostat (ULORIC) tablet 40 mg (40  mg Oral Given 07/15/16 0002)  torsemide (DEMADEX) tablet 20 mg (not administered)  carvedilol (COREG) tablet 25 mg (25 mg Oral Given 07/15/16 0001)  pregabalin (LYRICA) capsule 50 mg (50 mg Oral Given 07/15/16 0002)  HYDROcodone-acetaminophen (NORCO) 7.5-325 MG per tablet 1-2 tablet (1 tablet Oral Given 07/15/16 0018)  memantine (NAMENDA) tablet 5 mg (5 mg Oral Given 07/15/16 0018)  senna (SENOKOT) tablet 17.2 mg (17.2 mg Oral Given 07/15/16 0002)  bisacodyl (DULCOLAX) suppository 10 mg (not administered)  pantoprazole (PROTONIX) EC tablet 40 mg (not administered)  hydrALAZINE (APRESOLINE) tablet 25 mg (25 mg Oral Given 07/15/16 0002)  simethicone (MYLICON) chewable tablet 80 mg (not administered)  atorvastatin (LIPITOR) tablet 10 mg (not administered)  levothyroxine (SYNTHROID, LEVOTHROID) tablet 112 mcg (not administered)  ferrous sulfate tablet 325 mg (not administered)  cycloSPORINE (RESTASIS) 0.05 % ophthalmic emulsion 1 drop (not administered)  linaclotide (LINZESS) capsule 145 mcg (not administered)  polyethylene glycol (MIRALAX / GLYCOLAX) packet 17 g (17 g Oral Given 07/15/16 0003)  donepezil (ARICEPT) tablet 10 mg (10 mg Oral Given 07/15/16 0001)  loratadine (CLARITIN) tablet 10 mg (not administered)  DULoxetine (CYMBALTA) DR capsule 60 mg (not administered)  calcitRIOL (ROCALTROL) capsule 0.25 mcg (not administered)  nitroGLYCERIN (NITROSTAT) SL tablet 0.4 mg (not administered)  sodium chloride flush (NS) 0.9 % injection 3 mL (3 mLs Intravenous Given 07/15/16 0003)  ondansetron (ZOFRAN) tablet 4 mg (not administered)    Or  ondansetron (ZOFRAN) injection 4 mg (not administered)  acetaminophen (TYLENOL) tablet 650 mg (not administered)    Or  acetaminophen (TYLENOL) suppository 650 mg (not administered)  ipratropium-albuterol (DUONEB) 0.5-2.5 (3) MG/3ML nebulizer solution 3 mL (not administered)  MUSCLE RUB CREA (not administered)  camphor-menthol (SARNA) lotion (not administered)      Initial Impression / Assessment and Plan / ED Course  I have reviewed the triage vital signs and the nursing notes.  Pertinent labs & imaging results that were available during my care of the patient were reviewed by me and considered in my medical decision making (see chart for details).     Patient here for evaluation of injuries following a fall. She has multiple contusions on examination. CT head is negative for acute abnormality. INR is elevated at 4. Given her head contusions and elevated INR with recent fall plan to admit for observation and possible repeat CT head. Hospitalist contacted for admission for observation. Discussed with patient findings of studies and she is in agreement with plan.  Final Clinical Impressions(s) / ED Diagnoses   Final diagnoses:  Fall  Contusion of forehead, initial encounter  Fall, initial encounter  Supratherapeutic INR    New Prescriptions Current Discharge Medication List       Tilden Fossa, MD 07/15/16 [phone]

## 2016-07-14 NOTE — ED Notes (Signed)
Patient still in C-collar at this time.

## 2016-07-14 NOTE — Progress Notes (Signed)
ANTICOAGULATION CONSULT NOTE - Initial Consult  Pharmacy Consult for warfarin Indication: atrial fibrillation  Allergies  Allergen Reactions  . Nubain [Nalbuphine Hcl] Hives    Went into cardiac arrest   . Codeine Hives and Nausea Only  . Darvocet [Propoxyphene N-Acetaminophen] Nausea Only     Vital Signs: Temp: 98.2 F (36.8 C) (03/26 1729) Temp Source: Oral (03/26 1729) BP: 120/52 (03/26 2130) Pulse Rate: 60 (03/26 2130)  Labs:  Recent Labs  07/14/16 1823  HGB 10.8*  HCT 32.8*  PLT 227  LABPROT 40.3*  INR 4.03*  CREATININE 2.34*    Estimated Creatinine Clearance: 16.4 mL/min (A) (by C-G formula based on SCr of 2.34 mg/dL (H)).   Medical History: Past Medical History:  Diagnosis Date  . Acute on chronic renal failure [facility])    sees Dr Allena Katz   . Anemia    Acute blood loss  . Aortic regurgitation   . Aortic stenosis 10/13/2012   Low EF, low gradient with severe aortic stenosis confirmed by dobutamine stress echocardiogram s/p TAVR 12/2012  . Asthma   . Atrial fibrillation (HCC)    tachy-brady syndrome with <1% recurrent PAF since pacemaker placement  . Cataracts, bilateral   . Chronic combined systolic and diastolic CHF (congestive heart failure) (HCC)   . Chronic lower back pain   . CKD (chronic kidney disease)   . Coronary artery disease involving native coronary artery of native heart   . Dementia    Without behavioral disturbance  . Dysrhythmia   . Fibromyalgia   . Gastroesophageal reflux disease   . H/O dizziness   . H/O urinary frequency   . H/O: stroke   . Hard of hearing   . Headache    hx of migraines   . Heart murmur   . History of blood transfusion   . History of bronchitis   . History of kidney stones   . History of urinary tract infection   . HLD (hyperlipidemia)   . HTN (hypertension)   . Hypothyroidism   . Neuropathy (HCC)   . Nonischemic cardiomyopathy (HCC)   . On home oxygen therapy    patient uses at nite- 2L- has not used in  > 6 months per patient  . Osteoarthritis   . Osteoporosis   . Pneumonia    hx of x 3   . PONV (postoperative nausea and vomiting)   . Presence of permanent cardiac pacemaker   . Pulmonary embolism (HCC)    HISTORY OF, the pt. had a recurrent bilateral pulmonary emboli in 2005, on warfarin therapy and at which time she under went implantation of IVC filter  . Repeated falls   . Rupture of right patellar tendon   . Sciatica   . Shortness of breath dyspnea    with exertion   . Sleep apnea    uses oxygen at night and PRN- not used since > 6 months / DOES NOT USE  C-PAP  . Spinal stenosis   . Stroke (HCC)   . Symptomatic bradycardia   . Symptomatic bradycardia 2012   s/p [employer] PPM  . Syncope   . Urinary incontinence   . Urinary urgency    Assessment: 83 yo female admitted 3/26 with fall and subsequent head contusion. CT with no acute bleed. INR is supratherapeutic at 4.03 with last dose taken on 3/25 pm. CBC stable.   Goal of Therapy:  INR 2-3 Monitor platelets by anticoagulation protocol: Yes   Plan:  1. Hold warfarin  this evening 2. Daily INR  3. Follow up on long term anticoagulation plan  Pollyann Samples, PharmD, BCPS 07/14/2016, 10:45 PM

## 2016-07-14 NOTE — ED Notes (Signed)
Called phlebotomy and spoke to Saa to draw pt blood. Unable to establish IV.

## 2016-07-15 ENCOUNTER — Observation Stay (HOSPITAL_COMMUNITY): Payer: Medicare Other

## 2016-07-15 ENCOUNTER — Telehealth: Payer: Self-pay | Admitting: Cardiovascular Disease

## 2016-07-15 DIAGNOSIS — E785 Hyperlipidemia, unspecified: Secondary | ICD-10-CM | POA: Diagnosis not present

## 2016-07-15 DIAGNOSIS — S0093XA Contusion of unspecified part of head, initial encounter: Secondary | ICD-10-CM | POA: Diagnosis not present

## 2016-07-15 DIAGNOSIS — F0390 Unspecified dementia without behavioral disturbance: Secondary | ICD-10-CM | POA: Diagnosis not present

## 2016-07-15 DIAGNOSIS — R51 Headache: Secondary | ICD-10-CM | POA: Diagnosis not present

## 2016-07-15 DIAGNOSIS — I480 Paroxysmal atrial fibrillation: Secondary | ICD-10-CM | POA: Diagnosis not present

## 2016-07-15 DIAGNOSIS — I5032 Chronic diastolic (congestive) heart failure: Secondary | ICD-10-CM | POA: Diagnosis not present

## 2016-07-15 DIAGNOSIS — R791 Abnormal coagulation profile: Secondary | ICD-10-CM | POA: Diagnosis not present

## 2016-07-15 DIAGNOSIS — E039 Hypothyroidism, unspecified: Secondary | ICD-10-CM | POA: Diagnosis not present

## 2016-07-15 DIAGNOSIS — W19XXXA Unspecified fall, initial encounter: Secondary | ICD-10-CM | POA: Diagnosis not present

## 2016-07-15 DIAGNOSIS — S0083XA Contusion of other part of head, initial encounter: Secondary | ICD-10-CM | POA: Diagnosis not present

## 2016-07-15 DIAGNOSIS — D638 Anemia in other chronic diseases classified elsewhere: Secondary | ICD-10-CM | POA: Diagnosis not present

## 2016-07-15 LAB — CBC
HCT: 30.5 % — ABNORMAL LOW (ref 36.0–46.0)
HEMOGLOBIN: 10.1 g/dL — AB (ref 12.0–15.0)
MCH: 31 pg (ref 26.0–34.0)
MCHC: 33.1 g/dL (ref 30.0–36.0)
MCV: 93.6 fL (ref 78.0–100.0)
Platelets: 183 10*3/uL (ref 150–400)
RBC: 3.26 MIL/uL — AB (ref 3.87–5.11)
RDW: 16 % — ABNORMAL HIGH (ref 11.5–15.5)
WBC: 5.1 10*3/uL (ref 4.0–10.5)

## 2016-07-15 LAB — BASIC METABOLIC PANEL
ANION GAP: 12 (ref 5–15)
BUN: 52 mg/dL — ABNORMAL HIGH (ref 6–20)
CHLORIDE: 104 mmol/L (ref 101–111)
CO2: 26 mmol/L (ref 22–32)
CREATININE: 2.28 mg/dL — AB (ref 0.44–1.00)
Calcium: 9.2 mg/dL (ref 8.9–10.3)
GFR calc non Af Amer: 19 mL/min — ABNORMAL LOW (ref >60)
GFR, EST AFRICAN AMERICAN: 22 mL/min — AB (ref >60)
Glucose, Bld: 79 mg/dL (ref 65–99)
Potassium: 4 mmol/L (ref 3.5–5.1)
SODIUM: 142 mmol/L (ref 135–145)

## 2016-07-15 LAB — MRSA PCR SCREENING: MRSA by PCR: NEGATIVE

## 2016-07-15 LAB — URINALYSIS, ROUTINE W REFLEX MICROSCOPIC
BILIRUBIN URINE: NEGATIVE
GLUCOSE, UA: NEGATIVE mg/dL
HGB URINE DIPSTICK: NEGATIVE
Ketones, ur: NEGATIVE mg/dL
Leukocytes, UA: NEGATIVE
Nitrite: NEGATIVE
Protein, ur: NEGATIVE mg/dL
SPECIFIC GRAVITY, URINE: 1.009 (ref 1.005–1.030)
pH: 5 (ref 5.0–8.0)

## 2016-07-15 LAB — PROTIME-INR
INR: 3.06
PROTHROMBIN TIME: 32.3 s — AB (ref 11.4–15.2)

## 2016-07-15 MED ORDER — WARFARIN - PHARMACIST DOSING INPATIENT
Freq: Every day | Status: DC
  Administered 2016-07-17: 18:00:00

## 2016-07-15 MED ORDER — SALINE SPRAY 0.65 % NA SOLN
1.0000 | NASAL | Status: DC | PRN
  Administered 2016-07-16 (×2): 1 via NASAL
  Filled 2016-07-15: qty 44

## 2016-07-15 MED ORDER — DIPHENHYDRAMINE HCL 25 MG PO CAPS
50.0000 mg | ORAL_CAPSULE | Freq: Once | ORAL | Status: AC
  Administered 2016-07-15: 50 mg via ORAL
  Filled 2016-07-15: qty 2

## 2016-07-15 MED ORDER — DIPHENHYDRAMINE HCL 25 MG PO CAPS
25.0000 mg | ORAL_CAPSULE | ORAL | Status: DC | PRN
  Administered 2016-07-16 (×2): 25 mg via ORAL
  Filled 2016-07-15 (×2): qty 1

## 2016-07-15 NOTE — Progress Notes (Addendum)
PROGRESS NOTE    [name]  ZOX:[id number] DOB: 04/09/1933 DOA: 07/14/2016 PCP: Oneal Grout, MD   Chief Complaint  Patient presents with  . Fall    Brief Narrative:  HPI on 07/14/2016 by Dr. Madelyn Flavors [name] is a 83 y.o. female with medical history significant of HTN, PAF on Coumadin, diastolic HF, CKD stage IV, aortic stenosis, dementia, and chronic pain; who presents after having an  unwitnessed fall. Patient is somewhat of a poor historian and therefore some of history is obtained from review of records. She states that she was using the bathroom and fell from a standing position. Unclear if patient lost consciousness. Currently complains of pain in her neck, right shoulder, and bilateral wrist. When asked if she normally uses the restroom without assistance patient did not get an answer. At baseline patient utilizes a wheelchair as she knows needing assistance to ambulate. She was noted to have an elevated INR of 6 the other day.  Assessment & Plan   Fall with head contusion -Patient noted to have an unwitnessed fall in the bathroom at the nursing facility. -Continue Neuro checks every 4 hours -Suspect patient's bruising will worsen -CT head x2: no acute intracranial abnormalities -Will consult PT  Paroxysmal atrial fibrillation with supratherapeutic INR -CHADSVASC 6 -INR 4 on admission, currently 3.06 -Continue amiodarone -Coumadin held, resume per pharmacy when INR below 2 -Spoke to daughter regarding possible discontinuation of coumadin given patient's age and fall risk, urged her to speak with her brother and patient's prescribing physician  Chronic diastolic heart failure/ CAD/ HO of TAVR/  s/p PM   -Strict I&Os and daily weights -Continue Coreg ,hydralazine, torsemide, and imdur  Essential hypertension -Continue Coreg,hydralazine, torsemide, and imdur  Hypothyroidism -Continue Synthroid  Anemia of chronic kidney disease -Baseline  hemoglobin 10-11, currently 10.1 -Continue to monitor CBC  Chronic kidney disease stage IV -Creatinine appears to be at baseline, continue to monitor BMP  Dyslipidemia  -Continue Lipitor  Chronic pain  -continue Lyrica, Norco prn    Dementia without behavioral disturbance -Continue Namenda and Aricept  Headache -likely secondary to fall  -continue pain control PRN  DVT Prophylaxis  Coumadin- Supratherapeutic INR  Code Status: Full  Family Communication: None at bedside. Spoke to daughter.   Disposition Plan: Observation. Possibly d/c to SNF in 1-2 days.   Consultants None  Procedures  None  Antibiotics   Anti-infectives    None      Subjective:   [name] seen and examined today.  Complains of headache. Denies dizziness, visual changes, chest pain, shortness of breath, abdominal pain, N/V/D/C.   Objective:   Vitals:   07/14/16 2230 07/14/16 2322 07/15/16 0435 07/15/16 0947  BP: 115/71 (!) 136/58 (!) 126/43 (!) 142/50  Pulse: 60 62 65 62  Resp: 17 20 20 18   Temp:  98.2 F (36.8 C) 98.5 F (36.9 C) 97.9 F (36.6 C)  TempSrc:  Oral Oral Oral  SpO2: 93% 97% 95% 96%  Weight:  82.3 kg (181 lb 7 oz)    Height:  4\' 10"  (1.473 m)      Intake/Output Summary (Last 24 hours) at 07/15/16 1319 Last data filed at 07/15/16 0900  Gross per 24 hour  Intake              180 ml  Output              100 ml  Net  80 ml   Filed Weights   07/14/16 2322  Weight: 82.3 kg (181 lb 7 oz)    Exam  General: Well developed, well nourished, NAD, appears stated age  HEENT: NCAT- mild bruising on right temporal/frontal area, PERRLA, EOMI, Anicteic Sclera, mucous membranes moist.   Cardiovascular: S1 S2 auscultated, no rubs, murmurs or gallops. Regular rate and rhythm.  Respiratory: Clear to auscultation bilaterally with equal chest rise  Abdomen: Soft, nontender, nondistended, + bowel sounds  Extremities: warm dry without cyanosis clubbing. +LE  edema  Neuro: AAOx3, nonfocal  Skin: Without rashes exudates or nodules  Psych: Normal affect and demeanor   Data Reviewed: I have personally reviewed following labs and imaging studies  CBC:  Recent Labs Lab 07/14/16 1823 07/15/16 0626  WBC 5.3 5.1  NEUTROABS 2.9  --   HGB 10.8* 10.1*  HCT 32.8* 30.5*  MCV 95.3 93.6  PLT 227 183   Basic Metabolic Panel:  Recent Labs Lab 07/14/16 1823 07/15/16 0626  NA 142 142  K 4.5 4.0  CL 105 104  CO2 27 26  GLUCOSE 104* 79  BUN 52* 52*  CREATININE 2.34* 2.28*  CALCIUM 9.3 9.2   GFR: Estimated Creatinine Clearance: 17.3 mL/min (A) (by C-G formula based on SCr of 2.28 mg/dL (H)). Liver Function Tests:  Recent Labs Lab 07/14/16 1823  AST 25  ALT 14  ALKPHOS 63  BILITOT 0.6  PROT 6.1*  ALBUMIN 3.5   No results for input(s): LIPASE, AMYLASE in the last 168 hours. No results for input(s): AMMONIA in the last 168 hours. Coagulation Profile:  Recent Labs Lab 07/14/16 1823 07/15/16 0626  INR 4.03* 3.06   Cardiac Enzymes: No results for input(s): CKTOTAL, CKMB, CKMBINDEX, TROPONINI in the last 168 hours. BNP (last 3 results) No results for input(s): PROBNP in the last 8760 hours. HbA1C: No results for input(s): HGBA1C in the last 72 hours. CBG: No results for input(s): GLUCAP in the last 168 hours. Lipid Profile: No results for input(s): CHOL, HDL, LDLCALC, TRIG, CHOLHDL, LDLDIRECT in the last 72 hours. Thyroid Function Tests: No results for input(s): TSH, T4TOTAL, FREET4, T3FREE, THYROIDAB in the last 72 hours. Anemia Panel: No results for input(s): VITAMINB12, FOLATE, FERRITIN, TIBC, IRON, RETICCTPCT in the last 72 hours. Urine analysis:    Component Value Date/Time   COLORURINE YELLOW 07/15/2016 0914   APPEARANCEUR CLEAR 07/15/2016 0914   LABSPEC 1.009 07/15/2016 0914   PHURINE 5.0 07/15/2016 0914   GLUCOSEU NEGATIVE 07/15/2016 0914   HGBUR NEGATIVE 07/15/2016 0914   BILIRUBINUR NEGATIVE 07/15/2016  0914   KETONESUR NEGATIVE 07/15/2016 0914   PROTEINUR NEGATIVE 07/15/2016 0914   UROBILINOGEN 0.2 11/29/2014 1411   NITRITE NEGATIVE 07/15/2016 0914   LEUKOCYTESUR NEGATIVE 07/15/2016 0914   Sepsis Labs: @LABRCNTIP (procalcitonin:4,lacticidven:4)  ) Recent Results (from the past 240 hour(s))  MRSA PCR Screening     Status: None   Collection Time: 07/15/16 12:21 AM  Result Value Ref Range Status   MRSA by PCR NEGATIVE NEGATIVE Final    Comment:        The GeneXpert MRSA Assay (FDA approved for NASAL specimens only), is one component of a comprehensive MRSA colonization surveillance program. It is not intended to diagnose MRSA infection nor to guide or monitor treatment for MRSA infections.       Radiology Studies: Dg Chest 1 View  Result Date: 07/14/2016 CLINICAL DATA:  Status post fall, with right-sided chest pain and right shoulder bruising. Initial encounter. EXAM: CHEST 1 VIEW COMPARISON:  Chest radiograph performed 06/25/2016 FINDINGS: The lungs are mildly hypoexpanded, with minimal left basilar atelectasis. Mild peribronchial thickening is noted. There is no evidence of pleural effusion or pneumothorax. The cardiomediastinal silhouette is mildly enlarged. A pacemaker is noted overlying the right chest wall, with leads ending overlying the right atrium and right ventricle. A valve replacement is noted. No acute osseous abnormalities are seen. IMPRESSION: 1. No displaced rib fracture seen. 2. Lungs mildly hypoexpanded, with minimal left basilar atelectasis. Mild peribronchial thickening seen. 3. Mild cardiomegaly. Electronically Signed   By: Roanna Raider M.D.   On: 07/14/2016 18:27   Dg Shoulder Right  Result Date: 07/14/2016 CLINICAL DATA:  Status post fall from standing position, with right shoulder bruising. Initial encounter. EXAM: RIGHT SHOULDER - 2+ VIEW COMPARISON:  None. FINDINGS: There is no evidence of fracture or dislocation. The right humeral head is seated within  the glenoid fossa. Minimal degenerative change is noted about the glenohumeral joint. Degenerative change is seen at the right acromioclavicular joint. There is calcification of the right rotator cuff. A right-sided pacemaker is noted. The visualized portions of the right lung are grossly clear. IMPRESSION: 1. No evidence of fracture or dislocation. 2. Degenerative change at the right acromioclavicular joint. 3. Calcification at the right rotator cuff. Electronically Signed   By: Roanna Raider M.D.   On: 07/14/2016 18:34   Dg Wrist Complete Left  Result Date: 07/14/2016 CLINICAL DATA:  Had unwitnessed fall today. Not alert enough to localize her pain but specified some anterior wrist pain on left wrist and some lateral wrist pain on right wrist. EXAM: LEFT WRIST - COMPLETE 3+ VIEW COMPARISON:  None. FINDINGS: Generalized osteopenia. No acute fracture or dislocation. Mild radiocarpal osteoarthritis. Chondrocalcinosis of the TFCC as can be seen with CPPD. Mild osteoarthritis of the first [facility] joint and first MCP joint. IMPRESSION: No acute osseous injury of the left wrist. Electronically Signed   By: Elige Ko   On: 07/14/2016 21:11   Dg Wrist Complete Right  Result Date: 07/14/2016 CLINICAL DATA:  Status post fall, right wrist pain EXAM: RIGHT WRIST - COMPLETE 3+ VIEW COMPARISON:  None. FINDINGS: Generalized osteopenia. No acute fracture or dislocation. Mild osteoarthritis of the first CMC joint. Moderate osteoarthritis of the first MCP joint. Chondrocalcinosis of the right TFCC as can be seen with CPPD. IMPRESSION: No acute osseous injury of the right wrist. Electronically Signed   By: Elige Ko   On: 07/14/2016 21:13   Ct Head Wo Contrast  Result Date: 07/15/2016 CLINICAL DATA:  Unwitnessed fall yesterday.  Headache. EXAM: CT HEAD WITHOUT CONTRAST TECHNIQUE: Contiguous axial images were obtained from the base of the skull through the vertex without intravenous contrast. COMPARISON:  07/14/2016  FINDINGS: Brain: Old lacunar infarct in the left basal ganglia. Mild generalized atrophy. No acute intracranial abnormality. Specifically, no hemorrhage, hydrocephalus, mass lesion, acute infarction, or significant intracranial injury. Vascular: No hyperdense vessel or unexpected calcification. Skull: No acute calvarial abnormality. Sinuses/Orbits: Visualized paranasal sinuses and mastoids clear. Orbital soft tissues unremarkable. Other: None IMPRESSION: Mild atrophy. Old left lacunar basal ganglia infarct. No acute intracranial abnormality. Electronically Signed   By: Charlett Nose M.D.   On: 07/15/2016 09:01   Ct Head Wo Contrast  Result Date: 07/14/2016 CLINICAL DATA:  Larey Seat at home today, large bump on LEFT side of head, on blood thinners EXAM: CT HEAD WITHOUT CONTRAST CT CERVICAL SPINE WITHOUT CONTRAST TECHNIQUE: Multidetector CT imaging of the head and cervical spine was performed following the standard  protocol without intravenous contrast. Multiplanar CT image reconstructions of the cervical spine were also generated. COMPARISON:  CT head 05/24/2013 FINDINGS: CT HEAD FINDINGS Brain: Generalized atrophy. Normal ventricular morphology. No midline shift or mass effect. Old LEFT basal ganglia lacunar infarct. No intracranial hemorrhage, mass lesion or evidence acute infarction. No extra-axial fluid collections. Vascular: Atherosclerotic calcifications at the carotid bifurcations. Skull: Intact.  LEFT frontal scalp hematoma noted Sinuses/Orbits: Clear visualized mastoid air cells and paranasal sinuses Other: N/A CT CERVICAL SPINE FINDINGS Alignment: Minimal retrolisthesis at C3-C4 and C4-C5 likely degenerative. Remaining alignments normal. Skull base and vertebrae: Osseous demineralization. Vertebral body heights maintained. No fracture or bone destruction. Mild scattered facet degenerative changes. Visualized skullbase intact. Moderate calcified pannus adjacent to the odontoid process. Soft tissues and spinal  canal: Prevertebral soft tissues normal thickness. Dense atherosclerotic calcifications at the carotid bifurcations and at the proximal great vessels. Spinal canal grossly patent. Disc levels: Multilevel disc space narrowing and endplate spur formation C3-C4 through C6-C7. No obvious disc herniation. Upper chest: Minimal infiltrate or scarring at RIGHT apex. Other: RIGHT subclavian pacemaker leads noted. IMPRESSION: Generalized atrophy. Old LEFT basal ganglia lacunar infarct. No acute intracranial abnormalities. Degenerative disc and facet disease changes cervical spine. No acute cervical spine abnormalities. Minimal infiltrate versus scarring at RIGHT apex. Atherosclerotic calcifications throughout the carotid systems bilaterally. Electronically Signed   By: Ulyses Southward M.D.   On: 07/14/2016 17:46   Ct Cervical Spine Wo Contrast  Result Date: 07/14/2016 CLINICAL DATA:  Larey Seat at home today, large bump on LEFT side of head, on blood thinners EXAM: CT HEAD WITHOUT CONTRAST CT CERVICAL SPINE WITHOUT CONTRAST TECHNIQUE: Multidetector CT imaging of the head and cervical spine was performed following the standard protocol without intravenous contrast. Multiplanar CT image reconstructions of the cervical spine were also generated. COMPARISON:  CT head 05/24/2013 FINDINGS: CT HEAD FINDINGS Brain: Generalized atrophy. Normal ventricular morphology. No midline shift or mass effect. Old LEFT basal ganglia lacunar infarct. No intracranial hemorrhage, mass lesion or evidence acute infarction. No extra-axial fluid collections. Vascular: Atherosclerotic calcifications at the carotid bifurcations. Skull: Intact.  LEFT frontal scalp hematoma noted Sinuses/Orbits: Clear visualized mastoid air cells and paranasal sinuses Other: N/A CT CERVICAL SPINE FINDINGS Alignment: Minimal retrolisthesis at C3-C4 and C4-C5 likely degenerative. Remaining alignments normal. Skull base and vertebrae: Osseous demineralization. Vertebral body  heights maintained. No fracture or bone destruction. Mild scattered facet degenerative changes. Visualized skullbase intact. Moderate calcified pannus adjacent to the odontoid process. Soft tissues and spinal canal: Prevertebral soft tissues normal thickness. Dense atherosclerotic calcifications at the carotid bifurcations and at the proximal great vessels. Spinal canal grossly patent. Disc levels: Multilevel disc space narrowing and endplate spur formation C3-C4 through C6-C7. No obvious disc herniation. Upper chest: Minimal infiltrate or scarring at RIGHT apex. Other: RIGHT subclavian pacemaker leads noted. IMPRESSION: Generalized atrophy. Old LEFT basal ganglia lacunar infarct. No acute intracranial abnormalities. Degenerative disc and facet disease changes cervical spine. No acute cervical spine abnormalities. Minimal infiltrate versus scarring at RIGHT apex. Atherosclerotic calcifications throughout the carotid systems bilaterally. Electronically Signed   By: Ulyses Southward M.D.   On: 07/14/2016 17:46     Scheduled Meds: . amiodarone  100 mg Oral Daily  . atorvastatin  10 mg Oral q1800  . calcitRIOL  0.25 mcg Oral Daily  . carvedilol  25 mg Oral BID WC  . cycloSPORINE  1 drop Both Eyes BID  . donepezil  10 mg Oral QHS  . DULoxetine  60 mg Oral q1800  .  febuxostat  40 mg Oral q1800  . ferrous sulfate  325 mg Oral Daily  . hydrALAZINE  25 mg Oral TID  . levothyroxine  112 mcg Oral QAC breakfast  . linaclotide  145 mcg Oral Daily  . loratadine  10 mg Oral Daily  . memantine  5 mg Oral BID  . pantoprazole  40 mg Oral Daily  . polyethylene glycol  17 g Oral BID  . pregabalin  50 mg Oral 3 times per day  . senna  2 tablet Oral BID  . sodium chloride flush  3 mL Intravenous Q12H  . torsemide  20 mg Oral Daily  . Warfarin - Pharmacist Dosing Inpatient   Does not apply q1800   Continuous Infusions:   LOS: 0 days   Time Spent in minutes   30 minutes  Rhythm Gubbels D.O. on 07/15/2016 at  1:19 PM  Between 7am to 7pm - Pager - [phone]  After 7pm go to [email or web address] - password [email or web address]  And look for the night coverage person covering for me after hours  [facility] Office  [phone]

## 2016-07-15 NOTE — Telephone Encounter (Signed)
Pt's son, Onalee Hua, states pt fell at [facility] yesterday and was admitted to hospital, pt currently an inpatient. Onalee Hua states pt has been on coumadin. Epic records indicate [facility] has not been managing coumadin. Onalee Hua is asking about coumadin dosing and if coumadin should be continued.  Onalee Hua advised ask nurse/doctor at hospital about recommendations for coumadin/continuing coumadin at discharge, advised cardiology could be consulted if needed.

## 2016-07-15 NOTE — Care Management Obs Status (Signed)
MEDICARE OBSERVATION STATUS NOTIFICATION   Patient Details  Name: [name] MRN: [id number] Date of Birth: 1932-07-04   Medicare Observation Status Notification Given:  Yes    Havier Deeb, Annamarie Major, RN 07/15/2016, 11:47 AM

## 2016-07-15 NOTE — Progress Notes (Signed)
ANTICOAGULATION CONSULT NOTE  Pharmacy Consult for warfarin Indication: atrial fibrillation  Allergies  Allergen Reactions  . Nubain [Nalbuphine Hcl] Hives    Went into cardiac arrest   . Codeine Hives and Nausea Only  . Darvocet [Propoxyphene N-Acetaminophen] Nausea Only     Vital Signs: Temp: 97.9 F (36.6 C) (03/27 0947) Temp Source: Oral (03/27 0947) BP: 142/50 (03/27 0947) Pulse Rate: 62 (03/27 0947)  Labs:  Recent Labs  07/14/16 1823 07/15/16 0626  HGB 10.8* 10.1*  HCT 32.8* 30.5*  PLT 227 183  LABPROT 40.3* 32.3*  INR 4.03* 3.06  CREATININE 2.34* 2.28*    Estimated Creatinine Clearance: 17.3 mL/min (A) (by C-G formula based on SCr of 2.28 mg/dL (H)).  Assessment: 83 yo female admitted 3/26 with fall and subsequent head contusion. CT with no acute bleed.   Admission INR is supratherapeutic at 4.03, this morning's INR has dropped to 3.06. Noted in H&P that patient had recent INR of 6 (cannot see this value on file).  Hgb 10.1, plts 183- slight drop since admission, no overt bleeding noted.  Home dose 4mg  daily with last dose taken on 3/25 pm.   Goal of Therapy:  INR 2-3 Monitor platelets by anticoagulation protocol: Yes   Plan:  Hold warfarin again this evening- concerned with recent fall and elevated INR. Aware INR may drop quickly. Daily INR  Follow up on long term anticoagulation plan  Neida Ellegood D. Demetrios Byron, PharmD, BCPS Clinical Pharmacist Pager: [phone] 07/15/2016 10:26 AM

## 2016-07-15 NOTE — Telephone Encounter (Signed)
New Message:    Pt had a fall yesterday,she is in a facility. They are concerned,because pt is on Warfarin. They need to know hat to do about her Warfarin.

## 2016-07-15 NOTE — Progress Notes (Signed)
Notified md of itching

## 2016-07-16 ENCOUNTER — Telehealth: Payer: Self-pay | Admitting: Cardiovascular Disease

## 2016-07-16 DIAGNOSIS — W19XXXD Unspecified fall, subsequent encounter: Secondary | ICD-10-CM | POA: Diagnosis not present

## 2016-07-16 DIAGNOSIS — Z950 Presence of cardiac pacemaker: Secondary | ICD-10-CM | POA: Diagnosis not present

## 2016-07-16 DIAGNOSIS — I2510 Atherosclerotic heart disease of native coronary artery without angina pectoris: Secondary | ICD-10-CM | POA: Diagnosis present

## 2016-07-16 DIAGNOSIS — D631 Anemia in chronic kidney disease: Secondary | ICD-10-CM | POA: Diagnosis present

## 2016-07-16 DIAGNOSIS — Z90710 Acquired absence of both cervix and uterus: Secondary | ICD-10-CM | POA: Diagnosis not present

## 2016-07-16 DIAGNOSIS — F0390 Unspecified dementia without behavioral disturbance: Secondary | ICD-10-CM | POA: Diagnosis present

## 2016-07-16 DIAGNOSIS — Z9181 History of falling: Secondary | ICD-10-CM | POA: Diagnosis not present

## 2016-07-16 DIAGNOSIS — R2681 Unsteadiness on feet: Secondary | ICD-10-CM | POA: Diagnosis not present

## 2016-07-16 DIAGNOSIS — M542 Cervicalgia: Secondary | ICD-10-CM | POA: Diagnosis present

## 2016-07-16 DIAGNOSIS — I5032 Chronic diastolic (congestive) heart failure: Secondary | ICD-10-CM | POA: Diagnosis not present

## 2016-07-16 DIAGNOSIS — K219 Gastro-esophageal reflux disease without esophagitis: Secondary | ICD-10-CM | POA: Diagnosis present

## 2016-07-16 DIAGNOSIS — I5042 Chronic combined systolic (congestive) and diastolic (congestive) heart failure: Secondary | ICD-10-CM | POA: Diagnosis present

## 2016-07-16 DIAGNOSIS — G9389 Other specified disorders of brain: Secondary | ICD-10-CM | POA: Diagnosis not present

## 2016-07-16 DIAGNOSIS — W1830XA Fall on same level, unspecified, initial encounter: Secondary | ICD-10-CM | POA: Diagnosis present

## 2016-07-16 DIAGNOSIS — S0083XS Contusion of other part of head, sequela: Secondary | ICD-10-CM | POA: Diagnosis not present

## 2016-07-16 DIAGNOSIS — M6281 Muscle weakness (generalized): Secondary | ICD-10-CM | POA: Diagnosis not present

## 2016-07-16 DIAGNOSIS — S0990XA Unspecified injury of head, initial encounter: Secondary | ICD-10-CM | POA: Diagnosis not present

## 2016-07-16 DIAGNOSIS — I130 Hypertensive heart and chronic kidney disease with heart failure and stage 1 through stage 4 chronic kidney disease, or unspecified chronic kidney disease: Secondary | ICD-10-CM | POA: Diagnosis present

## 2016-07-16 DIAGNOSIS — I480 Paroxysmal atrial fibrillation: Secondary | ICD-10-CM | POA: Diagnosis not present

## 2016-07-16 DIAGNOSIS — M810 Age-related osteoporosis without current pathological fracture: Secondary | ICD-10-CM | POA: Diagnosis present

## 2016-07-16 DIAGNOSIS — E039 Hypothyroidism, unspecified: Secondary | ICD-10-CM | POA: Diagnosis present

## 2016-07-16 DIAGNOSIS — I495 Sick sinus syndrome: Secondary | ICD-10-CM | POA: Diagnosis present

## 2016-07-16 DIAGNOSIS — N184 Chronic kidney disease, stage 4 (severe): Secondary | ICD-10-CM | POA: Diagnosis present

## 2016-07-16 DIAGNOSIS — Y92121 Bathroom in nursing home as the place of occurrence of the external cause: Secondary | ICD-10-CM | POA: Diagnosis not present

## 2016-07-16 DIAGNOSIS — R791 Abnormal coagulation profile: Secondary | ICD-10-CM | POA: Diagnosis present

## 2016-07-16 DIAGNOSIS — S0083XA Contusion of other part of head, initial encounter: Secondary | ICD-10-CM | POA: Diagnosis present

## 2016-07-16 DIAGNOSIS — G8929 Other chronic pain: Secondary | ICD-10-CM | POA: Diagnosis present

## 2016-07-16 DIAGNOSIS — J45909 Unspecified asthma, uncomplicated: Secondary | ICD-10-CM | POA: Diagnosis present

## 2016-07-16 DIAGNOSIS — I429 Cardiomyopathy, unspecified: Secondary | ICD-10-CM | POA: Diagnosis present

## 2016-07-16 DIAGNOSIS — E785 Hyperlipidemia, unspecified: Secondary | ICD-10-CM | POA: Diagnosis present

## 2016-07-16 DIAGNOSIS — I959 Hypotension, unspecified: Secondary | ICD-10-CM | POA: Diagnosis not present

## 2016-07-16 DIAGNOSIS — Z952 Presence of prosthetic heart valve: Secondary | ICD-10-CM | POA: Diagnosis not present

## 2016-07-16 DIAGNOSIS — M797 Fibromyalgia: Secondary | ICD-10-CM | POA: Diagnosis present

## 2016-07-16 DIAGNOSIS — G4730 Sleep apnea, unspecified: Secondary | ICD-10-CM | POA: Diagnosis present

## 2016-07-16 DIAGNOSIS — H9190 Unspecified hearing loss, unspecified ear: Secondary | ICD-10-CM | POA: Diagnosis present

## 2016-07-16 LAB — BASIC METABOLIC PANEL
ANION GAP: 10 (ref 5–15)
BUN: 53 mg/dL — ABNORMAL HIGH (ref 6–20)
CO2: 28 mmol/L (ref 22–32)
Calcium: 8.9 mg/dL (ref 8.9–10.3)
Chloride: 104 mmol/L (ref 101–111)
Creatinine, Ser: 2.32 mg/dL — ABNORMAL HIGH (ref 0.44–1.00)
GFR calc Af Amer: 21 mL/min — ABNORMAL LOW (ref >60)
GFR, EST NON AFRICAN AMERICAN: 18 mL/min — AB (ref >60)
Glucose, Bld: 85 mg/dL (ref 65–99)
POTASSIUM: 4.1 mmol/L (ref 3.5–5.1)
SODIUM: 142 mmol/L (ref 135–145)

## 2016-07-16 LAB — PROTIME-INR
INR: 3.1
PROTHROMBIN TIME: 32.6 s — AB (ref 11.4–15.2)

## 2016-07-16 LAB — CBC
HCT: 28.9 % — ABNORMAL LOW (ref 36.0–46.0)
HEMOGLOBIN: 9.6 g/dL — AB (ref 12.0–15.0)
MCH: 31.4 pg (ref 26.0–34.0)
MCHC: 33.2 g/dL (ref 30.0–36.0)
MCV: 94.4 fL (ref 78.0–100.0)
PLATELETS: 200 10*3/uL (ref 150–400)
RBC: 3.06 MIL/uL — ABNORMAL LOW (ref 3.87–5.11)
RDW: 16.6 % — ABNORMAL HIGH (ref 11.5–15.5)
WBC: 4.7 10*3/uL (ref 4.0–10.5)

## 2016-07-16 MED ORDER — CARVEDILOL 3.125 MG PO TABS
3.1250 mg | ORAL_TABLET | Freq: Two times a day (BID) | ORAL | Status: DC
  Administered 2016-07-16 – 2016-07-17 (×3): 3.125 mg via ORAL
  Filled 2016-07-16 (×3): qty 1

## 2016-07-16 MED ORDER — SODIUM CHLORIDE 0.9 % IV BOLUS (SEPSIS)
500.0000 mL | Freq: Once | INTRAVENOUS | Status: AC
  Administered 2016-07-16: 500 mL via INTRAVENOUS

## 2016-07-16 MED ORDER — HYDRALAZINE HCL 10 MG PO TABS
10.0000 mg | ORAL_TABLET | Freq: Three times a day (TID) | ORAL | Status: DC
  Administered 2016-07-16 – 2016-07-17 (×3): 10 mg via ORAL
  Filled 2016-07-16 (×4): qty 1

## 2016-07-16 MED ORDER — SODIUM CHLORIDE 0.9 % IV BOLUS (SEPSIS)
250.0000 mL | Freq: Once | INTRAVENOUS | Status: AC
Start: 2016-07-16 — End: 2016-07-16
  Administered 2016-07-16: 250 mL via INTRAVENOUS

## 2016-07-16 NOTE — Progress Notes (Signed)
Patients B/P is 90/27 and HR is 59. Pupils pinpoint. Rapid Response nurse notified. MD notified. Cena Benton, MD gave verbal order to give 250 cc bolus and was not concerned regarding pupils. Orders followed. Will continue to monitor.

## 2016-07-16 NOTE — Progress Notes (Signed)
ANTICOAGULATION CONSULT NOTE  Pharmacy Consult for warfarin Indication: atrial fibrillation  Allergies  Allergen Reactions  . Nubain [Nalbuphine Hcl] Hives    Went into cardiac arrest   . Codeine Hives and Nausea Only  . Darvocet [Propoxyphene N-Acetaminophen] Nausea Only     Vital Signs: Temp: 98.7 F (37.1 C) (03/28 0938) Temp Source: Oral (03/28 0938) BP: 77/29 (03/28 0102) Pulse Rate: 80 (03/28 0938)  Labs:  Recent Labs  07/14/16 1823 07/15/16 0626 07/16/16 0701  HGB 10.8* 10.1* 9.6*  HCT 32.8* 30.5* 28.9*  PLT 227 183 200  LABPROT 40.3* 32.3* 32.6*  INR 4.03* 3.06 3.10  CREATININE 2.34* 2.28* 2.32*    Estimated Creatinine Clearance: 17 mL/min (A) (by C-G formula based on SCr of 2.32 mg/dL (H)).  Assessment: 83 yo female admitted 3/26 with fall and subsequent head contusion. CT with no acute bleed.   Admission INR was supratherapeutic at 4.03. Noted in H&P that patient had recent INR of 6 (cannot see this value on file)- uncertain who manages patient's warfarin, as there is a telephone note from [employer] [address] office stating they DO NOT manage warfarin. There are no other notes indicating warfarin prescriber.  INR still elevated today at 3.1. Hgb 9.6, plts 200- slight drop since admission, no overt bleeding noted.  Home dose 4mg  daily with last dose taken on 3/25 pm.   Goal of Therapy:  INR 2-3 Monitor platelets by anticoagulation protocol: Yes   Plan:  Hold warfarin again this evening Recommend restarting warfarin at 2mg  daily on 3/29 with an INR check on 3/31 or 4/2 Daily INR  Follow up on long term anticoagulation plan- Dr. Catha Gosselin asked patient's daughter to speak with patient's son and provider about decision to continue  Marlane Hirschmann D. Hiedi Touchton, PharmD, BCPS Clinical Pharmacist Pager: [phone] 07/16/2016 10:10 AM

## 2016-07-16 NOTE — Progress Notes (Signed)
PROGRESS NOTE    [name]  ZOX:[id number] DOB: 1932/06/17 DOA: 07/14/2016 PCP: Oneal Grout, MD   Chief Complaint  Patient presents with  . Fall    Brief Narrative:  HPI on 07/14/2016 by Dr. Madelyn Flavors [name] is a 83 y.o. female with medical history significant of HTN, PAF on Coumadin, diastolic HF, CKD stage IV, aortic stenosis, dementia, and chronic pain; who presents after having an  unwitnessed fall. Patient is somewhat of a poor historian and therefore some of history is obtained from review of records. She states that she was using the bathroom and fell from a standing position. Unclear if patient lost consciousness. Currently complains of pain in her neck, right shoulder, and bilateral wrist. When asked if she normally uses the restroom without assistance patient did not get an answer. At baseline patient utilizes a wheelchair as she knows needing assistance to ambulate. She was noted to have an elevated INR of 6 the other day.  Assessment & Plan   Fall with head contusion - Patient noted to have an unwitnessed fall in the bathroom at the nursing facility. - Suspect patient's bruising will worsen - CT head x2: no acute intracranial abnormalities - PT on board  Hypotension - secondary to decreased intravascular volume - responded to fluid boluses - will hold torsemide and decreased B blocker dose.  Paroxysmal atrial fibrillation with supratherapeutic INR -CHADSVASC 6 -INR 4 on admission, currently 3.06 -Continue amiodarone -Coumadin held, resume per pharmacy when INR below 2 -Agree that consideration needs to be made about whether continuing coumadin given fall history  Chronic diastolic heart failure/ CAD/ HO of TAVR/  s/p PM   -Strict I&Os and daily weights -Continue Coreg,hydralazine,  At lower doses - hold torsemide and imdur  Hypothyroidism -Continue Synthroid  Anemia of chronic kidney disease -Baseline hemoglobin 10-11, currently  10.1 -Continue to monitor CBC  Chronic kidney disease stage IV -Creatinine appears to be at baseline, continue to monitor BMP  Dyslipidemia  -Continue Lipitor  Chronic pain  -continue Lyrica, Norco prn    Dementia without behavioral disturbance -Continue Namenda and Aricept  Headache -likely secondary to fall  -continue pain control PRN  DVT Prophylaxis  Coumadin- Supratherapeutic INR  Code Status: Full  Family Communication: None at bedside.   Disposition Plan: Observation. Possibly d/c to SNF in 1-2 days.   Consultants None  Procedures  None  Antibiotics   Anti-infectives    None      Subjective:   [name] seen and examined today.  She reported no new complaint. After my visit nursing reported low blood pressure but A and O x 3 after fluid bolus  Objective:   Vitals:   07/16/16 0805 07/16/16 0938 07/16/16 1037 07/16/16 1108  BP: (!) 139/55 (!) 77/29 (!) 90/27 (!) 114/49  Pulse: 65 80 (!) 59 68  Resp:  18    Temp:  98.7 F (37.1 C)    TempSrc:  Oral    SpO2:  96%    Weight:      Height:        Intake/Output Summary (Last 24 hours) at 07/16/16 1201 Last data filed at 07/16/16 1105  Gross per 24 hour  Intake              243 ml  Output             1150 ml  Net             -907 ml  Filed Weights   07/14/16 2322  Weight: 82.3 kg (181 lb 7 oz)    Exam  General: Well developed, well nourished, NAD, appears stated age  HEENT: NCAT- mild bruising on right temporal/frontal area, PERRLA, EOMI, Anicteic Sclera, mucous membranes moist.   Cardiovascular: S1 S2 auscultated, no rubs, murmurs or gallops. Regular rate and rhythm.  Respiratory: Clear to auscultation bilaterally with equal chest rise, no wheezes  Abdomen: Soft, nontender, nondistended, + bowel sounds  Extremities: warm dry without cyanosis clubbing. +LE edema  Neuro: AAOx3, nonfocal  Skin: Without rashes exudates or nodules  Psych: Normal affect and demeanor   Data  Reviewed: I have personally reviewed following labs and imaging studies  CBC:  Recent Labs Lab 07/14/16 1823 07/15/16 0626 07/16/16 0701  WBC 5.3 5.1 4.7  NEUTROABS 2.9  --   --   HGB 10.8* 10.1* 9.6*  HCT 32.8* 30.5* 28.9*  MCV 95.3 93.6 94.4  PLT 227 183 200   Basic Metabolic Panel:  Recent Labs Lab 07/14/16 1823 07/15/16 0626 07/16/16 0701  NA 142 142 142  K 4.5 4.0 4.1  CL 105 104 104  CO2 27 26 28   GLUCOSE 104* 79 85  BUN 52* 52* 53*  CREATININE 2.34* 2.28* 2.32*  CALCIUM 9.3 9.2 8.9   GFR: Estimated Creatinine Clearance: 17 mL/min (A) (by C-G formula based on SCr of 2.32 mg/dL (H)). Liver Function Tests:  Recent Labs Lab 07/14/16 1823  AST 25  ALT 14  ALKPHOS 63  BILITOT 0.6  PROT 6.1*  ALBUMIN 3.5   No results for input(s): LIPASE, AMYLASE in the last 168 hours. No results for input(s): AMMONIA in the last 168 hours. Coagulation Profile:  Recent Labs Lab 07/14/16 1823 07/15/16 0626 07/16/16 0701  INR 4.03* 3.06 3.10   Cardiac Enzymes: No results for input(s): CKTOTAL, CKMB, CKMBINDEX, TROPONINI in the last 168 hours. BNP (last 3 results) No results for input(s): PROBNP in the last 8760 hours. HbA1C: No results for input(s): HGBA1C in the last 72 hours. CBG: No results for input(s): GLUCAP in the last 168 hours. Lipid Profile: No results for input(s): CHOL, HDL, LDLCALC, TRIG, CHOLHDL, LDLDIRECT in the last 72 hours. Thyroid Function Tests: No results for input(s): TSH, T4TOTAL, FREET4, T3FREE, THYROIDAB in the last 72 hours. Anemia Panel: No results for input(s): VITAMINB12, FOLATE, FERRITIN, TIBC, IRON, RETICCTPCT in the last 72 hours. Urine analysis:    Component Value Date/Time   COLORURINE YELLOW 07/15/2016 0914   APPEARANCEUR CLEAR 07/15/2016 0914   LABSPEC 1.009 07/15/2016 0914   PHURINE 5.0 07/15/2016 0914   GLUCOSEU NEGATIVE 07/15/2016 0914   HGBUR NEGATIVE 07/15/2016 0914   BILIRUBINUR NEGATIVE 07/15/2016 0914    KETONESUR NEGATIVE 07/15/2016 0914   PROTEINUR NEGATIVE 07/15/2016 0914   UROBILINOGEN 0.2 11/29/2014 1411   NITRITE NEGATIVE 07/15/2016 0914   LEUKOCYTESUR NEGATIVE 07/15/2016 0914   Sepsis Labs: @LABRCNTIP (procalcitonin:4,lacticidven:4)  ) Recent Results (from the past 240 hour(s))  MRSA PCR Screening     Status: None   Collection Time: 07/15/16 12:21 AM  Result Value Ref Range Status   MRSA by PCR NEGATIVE NEGATIVE Final    Comment:        The GeneXpert MRSA Assay (FDA approved for NASAL specimens only), is one component of a comprehensive MRSA colonization surveillance program. It is not intended to diagnose MRSA infection nor to guide or monitor treatment for MRSA infections.       Radiology Studies: Dg Chest 1 View  Result Date: 07/14/2016  CLINICAL DATA:  Status post fall, with right-sided chest pain and right shoulder bruising. Initial encounter. EXAM: CHEST 1 VIEW COMPARISON:  Chest radiograph performed 06/25/2016 FINDINGS: The lungs are mildly hypoexpanded, with minimal left basilar atelectasis. Mild peribronchial thickening is noted. There is no evidence of pleural effusion or pneumothorax. The cardiomediastinal silhouette is mildly enlarged. A pacemaker is noted overlying the right chest wall, with leads ending overlying the right atrium and right ventricle. A valve replacement is noted. No acute osseous abnormalities are seen. IMPRESSION: 1. No displaced rib fracture seen. 2. Lungs mildly hypoexpanded, with minimal left basilar atelectasis. Mild peribronchial thickening seen. 3. Mild cardiomegaly. Electronically Signed   By: Roanna Raider M.D.   On: 07/14/2016 18:27   Dg Shoulder Right  Result Date: 07/14/2016 CLINICAL DATA:  Status post fall from standing position, with right shoulder bruising. Initial encounter. EXAM: RIGHT SHOULDER - 2+ VIEW COMPARISON:  None. FINDINGS: There is no evidence of fracture or dislocation. The right humeral head is seated within the  glenoid fossa. Minimal degenerative change is noted about the glenohumeral joint. Degenerative change is seen at the right acromioclavicular joint. There is calcification of the right rotator cuff. A right-sided pacemaker is noted. The visualized portions of the right lung are grossly clear. IMPRESSION: 1. No evidence of fracture or dislocation. 2. Degenerative change at the right acromioclavicular joint. 3. Calcification at the right rotator cuff. Electronically Signed   By: Roanna Raider M.D.   On: 07/14/2016 18:34   Dg Wrist Complete Left  Result Date: 07/14/2016 CLINICAL DATA:  Had unwitnessed fall today. Not alert enough to localize her pain but specified some anterior wrist pain on left wrist and some lateral wrist pain on right wrist. EXAM: LEFT WRIST - COMPLETE 3+ VIEW COMPARISON:  None. FINDINGS: Generalized osteopenia. No acute fracture or dislocation. Mild radiocarpal osteoarthritis. Chondrocalcinosis of the TFCC as can be seen with CPPD. Mild osteoarthritis of the first [facility] joint and first MCP joint. IMPRESSION: No acute osseous injury of the left wrist. Electronically Signed   By: Elige Ko   On: 07/14/2016 21:11   Dg Wrist Complete Right  Result Date: 07/14/2016 CLINICAL DATA:  Status post fall, right wrist pain EXAM: RIGHT WRIST - COMPLETE 3+ VIEW COMPARISON:  None. FINDINGS: Generalized osteopenia. No acute fracture or dislocation. Mild osteoarthritis of the first [facility] joint. Moderate osteoarthritis of the first MCP joint. Chondrocalcinosis of the right TFCC as can be seen with CPPD. IMPRESSION: No acute osseous injury of the right wrist. Electronically Signed   By: Elige Ko   On: 07/14/2016 21:13   Ct Head Wo Contrast  Result Date: 07/15/2016 CLINICAL DATA:  Unwitnessed fall yesterday.  Headache. EXAM: CT HEAD WITHOUT CONTRAST TECHNIQUE: Contiguous axial images were obtained from the base of the skull through the vertex without intravenous contrast. COMPARISON:  07/14/2016  FINDINGS: Brain: Old lacunar infarct in the left basal ganglia. Mild generalized atrophy. No acute intracranial abnormality. Specifically, no hemorrhage, hydrocephalus, mass lesion, acute infarction, or significant intracranial injury. Vascular: No hyperdense vessel or unexpected calcification. Skull: No acute calvarial abnormality. Sinuses/Orbits: Visualized paranasal sinuses and mastoids clear. Orbital soft tissues unremarkable. Other: None IMPRESSION: Mild atrophy. Old left lacunar basal ganglia infarct. No acute intracranial abnormality. Electronically Signed   By: Charlett Nose M.D.   On: 07/15/2016 09:01   Ct Head Wo Contrast  Result Date: 07/14/2016 CLINICAL DATA:  Larey Seat at home today, large bump on LEFT side of head, on blood thinners EXAM: CT HEAD  WITHOUT CONTRAST CT CERVICAL SPINE WITHOUT CONTRAST TECHNIQUE: Multidetector CT imaging of the head and cervical spine was performed following the standard protocol without intravenous contrast. Multiplanar CT image reconstructions of the cervical spine were also generated. COMPARISON:  CT head 05/24/2013 FINDINGS: CT HEAD FINDINGS Brain: Generalized atrophy. Normal ventricular morphology. No midline shift or mass effect. Old LEFT basal ganglia lacunar infarct. No intracranial hemorrhage, mass lesion or evidence acute infarction. No extra-axial fluid collections. Vascular: Atherosclerotic calcifications at the carotid bifurcations. Skull: Intact.  LEFT frontal scalp hematoma noted Sinuses/Orbits: Clear visualized mastoid air cells and paranasal sinuses Other: N/A CT CERVICAL SPINE FINDINGS Alignment: Minimal retrolisthesis at C3-C4 and C4-C5 likely degenerative. Remaining alignments normal. Skull base and vertebrae: Osseous demineralization. Vertebral body heights maintained. No fracture or bone destruction. Mild scattered facet degenerative changes. Visualized skullbase intact. Moderate calcified pannus adjacent to the odontoid process. Soft tissues and spinal  canal: Prevertebral soft tissues normal thickness. Dense atherosclerotic calcifications at the carotid bifurcations and at the proximal great vessels. Spinal canal grossly patent. Disc levels: Multilevel disc space narrowing and endplate spur formation C3-C4 through C6-C7. No obvious disc herniation. Upper chest: Minimal infiltrate or scarring at RIGHT apex. Other: RIGHT subclavian pacemaker leads noted. IMPRESSION: Generalized atrophy. Old LEFT basal ganglia lacunar infarct. No acute intracranial abnormalities. Degenerative disc and facet disease changes cervical spine. No acute cervical spine abnormalities. Minimal infiltrate versus scarring at RIGHT apex. Atherosclerotic calcifications throughout the carotid systems bilaterally. Electronically Signed   By: Ulyses Southward M.D.   On: 07/14/2016 17:46   Ct Cervical Spine Wo Contrast  Result Date: 07/14/2016 CLINICAL DATA:  Larey Seat at home today, large bump on LEFT side of head, on blood thinners EXAM: CT HEAD WITHOUT CONTRAST CT CERVICAL SPINE WITHOUT CONTRAST TECHNIQUE: Multidetector CT imaging of the head and cervical spine was performed following the standard protocol without intravenous contrast. Multiplanar CT image reconstructions of the cervical spine were also generated. COMPARISON:  CT head 05/24/2013 FINDINGS: CT HEAD FINDINGS Brain: Generalized atrophy. Normal ventricular morphology. No midline shift or mass effect. Old LEFT basal ganglia lacunar infarct. No intracranial hemorrhage, mass lesion or evidence acute infarction. No extra-axial fluid collections. Vascular: Atherosclerotic calcifications at the carotid bifurcations. Skull: Intact.  LEFT frontal scalp hematoma noted Sinuses/Orbits: Clear visualized mastoid air cells and paranasal sinuses Other: N/A CT CERVICAL SPINE FINDINGS Alignment: Minimal retrolisthesis at C3-C4 and C4-C5 likely degenerative. Remaining alignments normal. Skull base and vertebrae: Osseous demineralization. Vertebral body  heights maintained. No fracture or bone destruction. Mild scattered facet degenerative changes. Visualized skullbase intact. Moderate calcified pannus adjacent to the odontoid process. Soft tissues and spinal canal: Prevertebral soft tissues normal thickness. Dense atherosclerotic calcifications at the carotid bifurcations and at the proximal great vessels. Spinal canal grossly patent. Disc levels: Multilevel disc space narrowing and endplate spur formation C3-C4 through C6-C7. No obvious disc herniation. Upper chest: Minimal infiltrate or scarring at RIGHT apex. Other: RIGHT subclavian pacemaker leads noted. IMPRESSION: Generalized atrophy. Old LEFT basal ganglia lacunar infarct. No acute intracranial abnormalities. Degenerative disc and facet disease changes cervical spine. No acute cervical spine abnormalities. Minimal infiltrate versus scarring at RIGHT apex. Atherosclerotic calcifications throughout the carotid systems bilaterally. Electronically Signed   By: Ulyses Southward M.D.   On: 07/14/2016 17:46     Scheduled Meds: . amiodarone  100 mg Oral Daily  . atorvastatin  10 mg Oral q1800  . calcitRIOL  0.25 mcg Oral Daily  . carvedilol  25 mg Oral BID WC  . cycloSPORINE  1 drop Both Eyes BID  . donepezil  10 mg Oral QHS  . DULoxetine  60 mg Oral q1800  . febuxostat  40 mg Oral q1800  . ferrous sulfate  325 mg Oral Daily  . hydrALAZINE  25 mg Oral TID  . levothyroxine  112 mcg Oral QAC breakfast  . linaclotide  145 mcg Oral Daily  . loratadine  10 mg Oral Daily  . memantine  5 mg Oral BID  . pantoprazole  40 mg Oral Daily  . polyethylene glycol  17 g Oral BID  . pregabalin  50 mg Oral 3 times per day  . senna  2 tablet Oral BID  . sodium chloride flush  3 mL Intravenous Q12H  . torsemide  20 mg Oral Daily  . Warfarin - Pharmacist Dosing Inpatient   Does not apply q1800   Continuous Infusions:   LOS: 0 days   Time Spent in minutes   30 minutes  Berda Shelvin D.O. on 07/16/2016 at 12:01  PM  Between 7am to 7pm - Pager - [phone]  After 7pm go to [email or web address] - password [email or web address]  And look for the night coverage person covering for me after hours  [facility] Office  [phone]

## 2016-07-16 NOTE — Progress Notes (Signed)
Called by RN with soft BP - NS bolus per MD - also concerned with patient's pupils pinpoint - no neuro changes - patient had 25 mg Coreg at 0800 - patient asx - advised RN to call MD with updates and to call as needed.  No RRT interventions needed.

## 2016-07-16 NOTE — Progress Notes (Signed)
Patients B/P is 115/39 and HR is 70. MD notified X2. Ordered to hold hydralazine and give coreg. Orders followed. Will continue to monitor.

## 2016-07-16 NOTE — Telephone Encounter (Signed)
Patient's son calling (DPR on file) and states that his mother is currently in the hospital for a fall and she is on coumadin. He states that her INR was elevated when she presented to the hospital. The son is wanting to know if his mother will or should continue on coumadin at discharge. I advised him to follow up the MDs at the hospital. Her coumadin is not managed by our coumadin clinic. The son states that he does not feel like her coumadin has been managed very well. He wanted to make Dr. Excell Seltzer aware of the situation and get his recommendation. Message routed to to Dr. Excell Seltzer.

## 2016-07-16 NOTE — Progress Notes (Signed)
Patients B/P is 114/49 and HR is 68 after 250 cc bolus. MD notified. No further orders. Will continue to monitor.

## 2016-07-16 NOTE — Progress Notes (Signed)
Patients B/P is 77/29 and HR 80. Patient states that she feels lightheaded. Cena Benton, MD notified. Verbal order to give 500cc bolus. Orders placed and followed. Will continue to monitor.

## 2016-07-16 NOTE — Telephone Encounter (Signed)
New message    Pt son is wanting to follow up about pt coumadin. He is still unclear on what to do when she gets out of hospital.

## 2016-07-17 DIAGNOSIS — N183 Chronic kidney disease, stage 3 (moderate): Secondary | ICD-10-CM | POA: Diagnosis not present

## 2016-07-17 DIAGNOSIS — R2681 Unsteadiness on feet: Secondary | ICD-10-CM | POA: Diagnosis not present

## 2016-07-17 DIAGNOSIS — M797 Fibromyalgia: Secondary | ICD-10-CM | POA: Diagnosis not present

## 2016-07-17 DIAGNOSIS — I5032 Chronic diastolic (congestive) heart failure: Secondary | ICD-10-CM | POA: Diagnosis not present

## 2016-07-17 DIAGNOSIS — I5043 Acute on chronic combined systolic (congestive) and diastolic (congestive) heart failure: Secondary | ICD-10-CM | POA: Diagnosis not present

## 2016-07-17 DIAGNOSIS — F0390 Unspecified dementia without behavioral disturbance: Secondary | ICD-10-CM | POA: Diagnosis present

## 2016-07-17 DIAGNOSIS — J810 Acute pulmonary edema: Secondary | ICD-10-CM | POA: Diagnosis not present

## 2016-07-17 DIAGNOSIS — Z9981 Dependence on supplemental oxygen: Secondary | ICD-10-CM | POA: Diagnosis not present

## 2016-07-17 DIAGNOSIS — Z9181 History of falling: Secondary | ICD-10-CM | POA: Diagnosis not present

## 2016-07-17 DIAGNOSIS — E039 Hypothyroidism, unspecified: Secondary | ICD-10-CM | POA: Diagnosis not present

## 2016-07-17 DIAGNOSIS — I2510 Atherosclerotic heart disease of native coronary artery without angina pectoris: Secondary | ICD-10-CM | POA: Diagnosis not present

## 2016-07-17 DIAGNOSIS — G629 Polyneuropathy, unspecified: Secondary | ICD-10-CM | POA: Diagnosis not present

## 2016-07-17 DIAGNOSIS — I482 Chronic atrial fibrillation: Secondary | ICD-10-CM | POA: Diagnosis not present

## 2016-07-17 DIAGNOSIS — M6281 Muscle weakness (generalized): Secondary | ICD-10-CM | POA: Diagnosis not present

## 2016-07-17 DIAGNOSIS — I10 Essential (primary) hypertension: Secondary | ICD-10-CM | POA: Diagnosis not present

## 2016-07-17 DIAGNOSIS — F329 Major depressive disorder, single episode, unspecified: Secondary | ICD-10-CM | POA: Diagnosis present

## 2016-07-17 DIAGNOSIS — Y95 Nosocomial condition: Secondary | ICD-10-CM | POA: Diagnosis present

## 2016-07-17 DIAGNOSIS — I447 Left bundle-branch block, unspecified: Secondary | ICD-10-CM | POA: Diagnosis not present

## 2016-07-17 DIAGNOSIS — I480 Paroxysmal atrial fibrillation: Secondary | ICD-10-CM | POA: Diagnosis not present

## 2016-07-17 DIAGNOSIS — I495 Sick sinus syndrome: Secondary | ICD-10-CM | POA: Diagnosis not present

## 2016-07-17 DIAGNOSIS — W19XXXD Unspecified fall, subsequent encounter: Secondary | ICD-10-CM | POA: Diagnosis not present

## 2016-07-17 DIAGNOSIS — J9621 Acute and chronic respiratory failure with hypoxia: Secondary | ICD-10-CM | POA: Diagnosis not present

## 2016-07-17 DIAGNOSIS — K5900 Constipation, unspecified: Secondary | ICD-10-CM | POA: Diagnosis not present

## 2016-07-17 DIAGNOSIS — J189 Pneumonia, unspecified organism: Secondary | ICD-10-CM | POA: Diagnosis not present

## 2016-07-17 DIAGNOSIS — Z950 Presence of cardiac pacemaker: Secondary | ICD-10-CM | POA: Diagnosis not present

## 2016-07-17 DIAGNOSIS — R296 Repeated falls: Secondary | ICD-10-CM | POA: Diagnosis not present

## 2016-07-17 DIAGNOSIS — R072 Precordial pain: Secondary | ICD-10-CM | POA: Diagnosis not present

## 2016-07-17 DIAGNOSIS — E785 Hyperlipidemia, unspecified: Secondary | ICD-10-CM | POA: Diagnosis not present

## 2016-07-17 DIAGNOSIS — R0602 Shortness of breath: Secondary | ICD-10-CM | POA: Diagnosis not present

## 2016-07-17 DIAGNOSIS — R0902 Hypoxemia: Secondary | ICD-10-CM | POA: Diagnosis not present

## 2016-07-17 DIAGNOSIS — S0083XS Contusion of other part of head, sequela: Secondary | ICD-10-CM | POA: Diagnosis not present

## 2016-07-17 DIAGNOSIS — S0990XA Unspecified injury of head, initial encounter: Secondary | ICD-10-CM | POA: Diagnosis not present

## 2016-07-17 DIAGNOSIS — J9690 Respiratory failure, unspecified, unspecified whether with hypoxia or hypercapnia: Secondary | ICD-10-CM | POA: Diagnosis not present

## 2016-07-17 DIAGNOSIS — G9389 Other specified disorders of brain: Secondary | ICD-10-CM | POA: Diagnosis not present

## 2016-07-17 DIAGNOSIS — G8929 Other chronic pain: Secondary | ICD-10-CM | POA: Diagnosis not present

## 2016-07-17 DIAGNOSIS — Z96651 Presence of right artificial knee joint: Secondary | ICD-10-CM | POA: Diagnosis not present

## 2016-07-17 DIAGNOSIS — I130 Hypertensive heart and chronic kidney disease with heart failure and stage 1 through stage 4 chronic kidney disease, or unspecified chronic kidney disease: Secondary | ICD-10-CM | POA: Diagnosis not present

## 2016-07-17 DIAGNOSIS — M109 Gout, unspecified: Secondary | ICD-10-CM | POA: Diagnosis not present

## 2016-07-17 DIAGNOSIS — K219 Gastro-esophageal reflux disease without esophagitis: Secondary | ICD-10-CM | POA: Diagnosis not present

## 2016-07-17 LAB — PROTIME-INR
INR: 2.67
Prothrombin Time: 29 seconds — ABNORMAL HIGH (ref 11.4–15.2)

## 2016-07-17 MED ORDER — PREGABALIN 50 MG PO CAPS: 50.0000 mg | ORAL_CAPSULE | Freq: Two times a day (BID) | ORAL | Status: DC | PRN

## 2016-07-17 MED ORDER — WARFARIN SODIUM 2 MG PO TABS
2.0000 mg | ORAL_TABLET | Freq: Every day | ORAL | Status: DC
  Administered 2016-07-17: 2 mg via ORAL
  Filled 2016-07-17: qty 1

## 2016-07-17 MED ORDER — HYDRALAZINE HCL 10 MG PO TABS: 10.0000 mg | ORAL_TABLET | Freq: Three times a day (TID) | ORAL | Status: DC

## 2016-07-17 MED ORDER — CARVEDILOL 3.125 MG PO TABS: 3.1250 mg | ORAL_TABLET | Freq: Two times a day (BID) | ORAL | Status: DC

## 2016-07-17 MED ORDER — WARFARIN SODIUM 2 MG PO TABS: 2.0000 mg | ORAL_TABLET | Freq: Every day | ORAL | 0 refills | Status: DC

## 2016-07-17 NOTE — Discharge Summary (Signed)
Physician Discharge Summary  [name] ZOX:[id number] DOB: May 07, 1932 DOA: 07/14/2016  PCP: Oneal Grout, MD  Admit date: 07/14/2016 Discharge date: 07/17/2016  Time spent: > 35 minutes  Recommendations for Outpatient Follow-up:  1. Monitor serum creatinine. 2. Decide when to continue torsemide and imdur   Discharge Diagnoses:  Principal Problem:   Fall Active Problems:   Pacemaker   Paroxysmal atrial fibrillation (HCC)   Dyslipidemia   Chronic diastolic heart failure (HCC)   Anemia of chronic disease   Dementia without behavioral disturbance   Hypothyroidism   Head contusion   Discharge Condition: stable  Diet recommendation: Heart healthy  Filed Weights   07/14/16 2322 07/16/16 2055 07/17/16 0500  Weight: 82.3 kg (181 lb 7 oz) 81.6 kg (180 lb) 81.6 kg (179 lb 14.3 oz)    History of present illness:  83 y.o.femalewith medical history significant of HTN, PAF on Coumadin, diastolic HF,CKD stage IV, aortic stenosis, dementia, and chronic pain; who presents after having an unwitnessed fall. Patient is somewhat of a poor historian and therefore some of history is obtained from review of records. She states that she was using the bathroom and fell from a standing position. Unclear if patient lost consciousness. Currently complains of pain in her neck, right shoulder, and bilateral wrist.When asked if she normally uses the restroom without assistance patient did not get an answer. At baseline patient utilizes a wheelchair as she knows needing assistance to ambulate. She was noted to have an elevated INR of 6 the other day.  Hospital Course:  Fall with head contusion - Patient noted to have an unwitnessed fall in the bathroom at the nursing facility. - CT head x2: no acute intracranial abnormalities - recommend ongoing PT  Hypotension - secondary to decreased intravascular volume - responded to fluid boluses - will hold torsemide and imdur and decreased B blocker  dose on d/c  Paroxysmal atrial fibrillation with supratherapeutic INR -CHADSVASC 6 -INR 4 on admission -Continue amiodarone -Coumadin to be continued at 2mg  po daily with the next check 3/31 -Agree that consideration needs to be made about whether continuing coumadin given fall history, will defer talks to pcp at SNF  Chronic diastolic heart failure/ CAD/ HO of TAVR/ s/p PM  -Continue Coreg,hydralazine,  At lower doses - hold torsemide and imdur on d/c  Hypothyroidism -Continue Synthroid  Anemia of chronic kidney disease -Baseline hemoglobin 10-11, currently 10.1 -Continue to monitor CBC  Chronic kidney disease stage IV -Creatinine appears to be at baseline, continue to monitor BMP  Dyslipidemia  -Continue Lipitor  Chronic pain  -continue Lyrica, Norco prn   Dementia without behavioral disturbance -Continue Namenda and Aricept  Headache -likely secondary to fall  -continue pain control PRN  Procedures:  none  Consultations:  None  Discharge Exam: Vitals:   07/17/16 0624 07/17/16 0949  BP: (!) 156/52 (!) 136/56  Pulse: 64 79  Resp: 19 16  Temp: 98.6 F (37 C) 98.6 F (37 C)    General: Pt in nad, alert and awake Cardiovascular: s1 and s2 wnl, no rubs Respiratory: no increased wob, no wheezes  Discharge Instructions   Discharge Instructions    Call MD for:  extreme fatigue    Complete by:  As directed    Call MD for:  redness, tenderness, or signs of infection (pain, swelling, redness, odor or green/yellow discharge around incision site)    Complete by:  As directed    Call MD for:  temperature >100.4    Complete  by:  As directed    Diet - low sodium heart healthy    Complete by:  As directed    Discharge instructions    Complete by:  As directed    Reassess INR within the next 2-3 days and adjust coumadin as needed per SNF protocol.   Increase activity slowly    Complete by:  As directed      Current Discharge Medication List     CONTINUE these medications which have CHANGED   Details  carvedilol (COREG) 3.125 MG tablet Take 1 tablet (3.125 mg total) by mouth 2 (two) times daily with a meal.   Associated Diagnoses: Essential hypertension    hydrALAZINE (APRESOLINE) 10 MG tablet Take 1 tablet (10 mg total) by mouth 3 (three) times daily.    pregabalin (LYRICA) 50 MG capsule Take 1 capsule (50 mg total) by mouth 2 (two) times daily as needed (pain).    warfarin (COUMADIN) 2 MG tablet Take 1 tablet (2 mg total) by mouth daily at 6 PM. Qty: 15 tablet, Refills: 0      CONTINUE these medications which have NOT CHANGED   Details  alendronate (FOSAMAX) 70 MG/75ML solution Take 70 mg by mouth every Saturday. Take on an empty stomach with a full glass of fluids, remain upright 30 minutes after administration - for bone health    amiodarone (PACERONE) 100 MG tablet Take 100 mg by mouth daily.    atorvastatin (LIPITOR) 10 MG tablet Take 10 mg by mouth daily at 6 PM.     Biotin 5000 MCG CAPS Take 5,000 mcg by mouth daily at 6 PM.    bisacodyl (DULCOLAX) 10 MG suppository Place 10 mg rectally daily as needed (constipation).     calcitRIOL (ROCALTROL) 0.25 MCG capsule Take 0.25 mcg by mouth daily.     cholecalciferol (VITAMIN D) 1000 units tablet Take 2,000 Units by mouth at bedtime.    cycloSPORINE (RESTASIS) 0.05 % ophthalmic emulsion Place 1 drop into both eyes 2 (two) times daily.     donepezil (ARICEPT) 10 MG tablet Take 10 mg by mouth at bedtime.     DULoxetine (CYMBALTA) 60 MG capsule Take 60 mg by mouth daily at 6 PM.     esomeprazole (NEXIUM) 40 MG capsule Take 40 mg by mouth daily.     febuxostat (ULORIC) 40 MG tablet Take 40 mg by mouth daily at 6 PM.    ferrous sulfate 325 (65 FE) MG tablet Take 325 mg by mouth daily.     ipratropium-albuterol (DUONEB) 0.5-2.5 (3) MG/3ML SOLN Take 3 mLs by nebulization every 6 (six) hours as needed (wheezing/shortness of breath).    levothyroxine (SYNTHROID,  LEVOTHROID) 112 MCG tablet Take 112 mcg by mouth daily.     Linaclotide (LINZESS) 145 MCG CAPS capsule Take 145 mcg by mouth daily.     memantine (NAMENDA) 5 MG tablet Take 5 mg by mouth 2 (two) times daily.     Multiple Vitamin (MULTIVITAMIN WITH MINERALS) TABS tablet Take 1 tablet by mouth daily.    nitroGLYCERIN (NITROSTAT) 0.4 MG SL tablet Place 0.4 mg under the tongue every 5 (five) minutes as needed for chest pain (MAX 3 TABLETS).     pramoxine (SARNA SENSITIVE) 1 % LOTN Apply 1 application topically daily as needed (itching).     simethicone (MYLICON) 80 MG chewable tablet Chew 80 mg by mouth every 12 (twelve) hours as needed for flatulence.     vitamin B-12 (CYANOCOBALAMIN) 1000 MCG tablet Take 1,000  mcg by mouth 2 (two) times a week. Tuesday and Friday      STOP taking these medications     cetirizine (ZYRTEC) 10 MG tablet      HYDROcodone-acetaminophen (NORCO) 7.5-325 MG tablet      isosorbide mononitrate (IMDUR) 30 MG 24 hr tablet      Menthol (ICY HOT ADVANCED RELIEF) 7.5 % PTCH      Menthol, Topical Analgesic, (BIOFREEZE) 4 % GEL      polyethylene glycol (MIRALAX / GLYCOLAX) packet      senna (SENOKOT) 8.6 MG TABS tablet      torsemide (DEMADEX) 20 MG tablet      Turmeric 500 MG CAPS        Allergies  Allergen Reactions  . Nubain [Nalbuphine Hcl] Hives    Went into cardiac arrest   . Codeine Hives and Nausea Only  . Darvocet [Propoxyphene N-Acetaminophen] Nausea Only      The results of significant diagnostics from this hospitalization (including imaging, microbiology, ancillary and laboratory) are listed below for reference.    Significant Diagnostic Studies: Dg Chest 1 View  Result Date: 07/14/2016 CLINICAL DATA:  Status post fall, with right-sided chest pain and right shoulder bruising. Initial encounter. EXAM: CHEST 1 VIEW COMPARISON:  Chest radiograph performed 06/25/2016 FINDINGS: The lungs are mildly hypoexpanded, with minimal left basilar  atelectasis. Mild peribronchial thickening is noted. There is no evidence of pleural effusion or pneumothorax. The cardiomediastinal silhouette is mildly enlarged. A pacemaker is noted overlying the right chest wall, with leads ending overlying the right atrium and right ventricle. A valve replacement is noted. No acute osseous abnormalities are seen. IMPRESSION: 1. No displaced rib fracture seen. 2. Lungs mildly hypoexpanded, with minimal left basilar atelectasis. Mild peribronchial thickening seen. 3. Mild cardiomegaly. Electronically Signed   By: Roanna Raider M.D.   On: 07/14/2016 18:27   Dg Chest 2 View  Result Date: 06/25/2016 CLINICAL DATA:  CHF EXAM: CHEST  2 VIEW COMPARISON:  06/23/2016 FINDINGS: Heart remains moderately enlarged. Postoperative changes from aortic valvular repair are noted. Double lead right subclavian pacemaker device and leads are stable and intact. Normal pulmonary vascularity. Low volumes. Subsegmental atelectasis at the left base is unchanged. No pneumothorax. Right lower lateral rib deformity is stable. IMPRESSION: No evidence of CHF. Subsegmental atelectasis at the left base. Electronically Signed   By: Jolaine Click M.D.   On: 06/25/2016 07:54   Dg Chest 2 View  Result Date: 06/23/2016 CLINICAL DATA:  CHF, shortness of breath EXAM: CHEST  2 VIEW COMPARISON:  06/15/2016 FINDINGS: Right-sided duo lead pacemaker similar compared to prior. Valvular prosthesis. Mild increased atelectasis or infiltrate at the left lung base. Suspect tiny left pleural effusion. Stable enlarged cardiomediastinal without overt failure. Atherosclerosis. No pneumothorax. IMPRESSION: 1. Increasing atelectasis or infiltrate at the left base with possible tiny left effusion 2. Stable cardiomegaly Electronically Signed   By: Jasmine Pang M.D.   On: 06/23/2016 14:23   Dg Shoulder Right  Result Date: 07/14/2016 CLINICAL DATA:  Status post fall from standing position, with right shoulder bruising. Initial  encounter. EXAM: RIGHT SHOULDER - 2+ VIEW COMPARISON:  None. FINDINGS: There is no evidence of fracture or dislocation. The right humeral head is seated within the glenoid fossa. Minimal degenerative change is noted about the glenohumeral joint. Degenerative change is seen at the right acromioclavicular joint. There is calcification of the right rotator cuff. A right-sided pacemaker is noted. The visualized portions of the right lung are grossly clear.  IMPRESSION: 1. No evidence of fracture or dislocation. 2. Degenerative change at the right acromioclavicular joint. 3. Calcification at the right rotator cuff. Electronically Signed   By: Roanna Raider M.D.   On: 07/14/2016 18:34   Dg Wrist Complete Left  Result Date: 07/14/2016 CLINICAL DATA:  Had unwitnessed fall today. Not alert enough to localize her pain but specified some anterior wrist pain on left wrist and some lateral wrist pain on right wrist. EXAM: LEFT WRIST - COMPLETE 3+ VIEW COMPARISON:  None. FINDINGS: Generalized osteopenia. No acute fracture or dislocation. Mild radiocarpal osteoarthritis. Chondrocalcinosis of the TFCC as can be seen with CPPD. Mild osteoarthritis of the first [facility] joint and first MCP joint. IMPRESSION: No acute osseous injury of the left wrist. Electronically Signed   By: Elige Ko   On: 07/14/2016 21:11   Dg Wrist Complete Right  Result Date: 07/14/2016 CLINICAL DATA:  Status post fall, right wrist pain EXAM: RIGHT WRIST - COMPLETE 3+ VIEW COMPARISON:  None. FINDINGS: Generalized osteopenia. No acute fracture or dislocation. Mild osteoarthritis of the first CMC joint. Moderate osteoarthritis of the first MCP joint. Chondrocalcinosis of the right TFCC as can be seen with CPPD. IMPRESSION: No acute osseous injury of the right wrist. Electronically Signed   By: Elige Ko   On: 07/14/2016 21:13   Ct Head Wo Contrast  Result Date: 07/15/2016 CLINICAL DATA:  Unwitnessed fall yesterday.  Headache. EXAM: CT HEAD WITHOUT  CONTRAST TECHNIQUE: Contiguous axial images were obtained from the base of the skull through the vertex without intravenous contrast. COMPARISON:  07/14/2016 FINDINGS: Brain: Old lacunar infarct in the left basal ganglia. Mild generalized atrophy. No acute intracranial abnormality. Specifically, no hemorrhage, hydrocephalus, mass lesion, acute infarction, or significant intracranial injury. Vascular: No hyperdense vessel or unexpected calcification. Skull: No acute calvarial abnormality. Sinuses/Orbits: Visualized paranasal sinuses and mastoids clear. Orbital soft tissues unremarkable. Other: None IMPRESSION: Mild atrophy. Old left lacunar basal ganglia infarct. No acute intracranial abnormality. Electronically Signed   By: Charlett Nose M.D.   On: 07/15/2016 09:01   Ct Head Wo Contrast  Result Date: 07/14/2016 CLINICAL DATA:  Larey Seat at home today, large bump on LEFT side of head, on blood thinners EXAM: CT HEAD WITHOUT CONTRAST CT CERVICAL SPINE WITHOUT CONTRAST TECHNIQUE: Multidetector CT imaging of the head and cervical spine was performed following the standard protocol without intravenous contrast. Multiplanar CT image reconstructions of the cervical spine were also generated. COMPARISON:  CT head 05/24/2013 FINDINGS: CT HEAD FINDINGS Brain: Generalized atrophy. Normal ventricular morphology. No midline shift or mass effect. Old LEFT basal ganglia lacunar infarct. No intracranial hemorrhage, mass lesion or evidence acute infarction. No extra-axial fluid collections. Vascular: Atherosclerotic calcifications at the carotid bifurcations. Skull: Intact.  LEFT frontal scalp hematoma noted Sinuses/Orbits: Clear visualized mastoid air cells and paranasal sinuses Other: N/A CT CERVICAL SPINE FINDINGS Alignment: Minimal retrolisthesis at C3-C4 and C4-C5 likely degenerative. Remaining alignments normal. Skull base and vertebrae: Osseous demineralization. Vertebral body heights maintained. No fracture or bone  destruction. Mild scattered facet degenerative changes. Visualized skullbase intact. Moderate calcified pannus adjacent to the odontoid process. Soft tissues and spinal canal: Prevertebral soft tissues normal thickness. Dense atherosclerotic calcifications at the carotid bifurcations and at the proximal great vessels. Spinal canal grossly patent. Disc levels: Multilevel disc space narrowing and endplate spur formation C3-C4 through C6-C7. No obvious disc herniation. Upper chest: Minimal infiltrate or scarring at RIGHT apex. Other: RIGHT subclavian pacemaker leads noted. IMPRESSION: Generalized atrophy. Old LEFT basal ganglia lacunar  infarct. No acute intracranial abnormalities. Degenerative disc and facet disease changes cervical spine. No acute cervical spine abnormalities. Minimal infiltrate versus scarring at RIGHT apex. Atherosclerotic calcifications throughout the carotid systems bilaterally. Electronically Signed   By: Ulyses Southward M.D.   On: 07/14/2016 17:46   Ct Cervical Spine Wo Contrast  Result Date: 07/14/2016 CLINICAL DATA:  Larey Seat at home today, large bump on LEFT side of head, on blood thinners EXAM: CT HEAD WITHOUT CONTRAST CT CERVICAL SPINE WITHOUT CONTRAST TECHNIQUE: Multidetector CT imaging of the head and cervical spine was performed following the standard protocol without intravenous contrast. Multiplanar CT image reconstructions of the cervical spine were also generated. COMPARISON:  CT head 05/24/2013 FINDINGS: CT HEAD FINDINGS Brain: Generalized atrophy. Normal ventricular morphology. No midline shift or mass effect. Old LEFT basal ganglia lacunar infarct. No intracranial hemorrhage, mass lesion or evidence acute infarction. No extra-axial fluid collections. Vascular: Atherosclerotic calcifications at the carotid bifurcations. Skull: Intact.  LEFT frontal scalp hematoma noted Sinuses/Orbits: Clear visualized mastoid air cells and paranasal sinuses Other: N/A CT CERVICAL SPINE FINDINGS  Alignment: Minimal retrolisthesis at C3-C4 and C4-C5 likely degenerative. Remaining alignments normal. Skull base and vertebrae: Osseous demineralization. Vertebral body heights maintained. No fracture or bone destruction. Mild scattered facet degenerative changes. Visualized skullbase intact. Moderate calcified pannus adjacent to the odontoid process. Soft tissues and spinal canal: Prevertebral soft tissues normal thickness. Dense atherosclerotic calcifications at the carotid bifurcations and at the proximal great vessels. Spinal canal grossly patent. Disc levels: Multilevel disc space narrowing and endplate spur formation C3-C4 through C6-C7. No obvious disc herniation. Upper chest: Minimal infiltrate or scarring at RIGHT apex. Other: RIGHT subclavian pacemaker leads noted. IMPRESSION: Generalized atrophy. Old LEFT basal ganglia lacunar infarct. No acute intracranial abnormalities. Degenerative disc and facet disease changes cervical spine. No acute cervical spine abnormalities. Minimal infiltrate versus scarring at RIGHT apex. Atherosclerotic calcifications throughout the carotid systems bilaterally. Electronically Signed   By: Ulyses Southward M.D.   On: 07/14/2016 17:46    Microbiology: Recent Results (from the past 240 hour(s))  MRSA PCR Screening     Status: None   Collection Time: 07/15/16 12:21 AM  Result Value Ref Range Status   MRSA by PCR NEGATIVE NEGATIVE Final    Comment:        The GeneXpert MRSA Assay (FDA approved for NASAL specimens only), is one component of a comprehensive MRSA colonization surveillance program. It is not intended to diagnose MRSA infection nor to guide or monitor treatment for MRSA infections.      Labs: Basic Metabolic Panel:  Recent Labs Lab 07/14/16 1823 07/15/16 0626 07/16/16 0701  NA 142 142 142  K 4.5 4.0 4.1  CL 105 104 104  CO2 27 26 28   GLUCOSE 104* 79 85  BUN 52* 52* 53*  CREATININE 2.34* 2.28* 2.32*  CALCIUM 9.3 9.2 8.9   Liver  Function Tests:  Recent Labs Lab 07/14/16 1823  AST 25  ALT 14  ALKPHOS 63  BILITOT 0.6  PROT 6.1*  ALBUMIN 3.5   No results for input(s): LIPASE, AMYLASE in the last 168 hours. No results for input(s): AMMONIA in the last 168 hours. CBC:  Recent Labs Lab 07/14/16 1823 07/15/16 0626 07/16/16 0701  WBC 5.3 5.1 4.7  NEUTROABS 2.9  --   --   HGB 10.8* 10.1* 9.6*  HCT 32.8* 30.5* 28.9*  MCV 95.3 93.6 94.4  PLT 227 183 200   Cardiac Enzymes: No results for input(s): CKTOTAL, CKMB, CKMBINDEX, TROPONINI  in the last 168 hours. BNP: BNP (last 3 results)  Recent Labs  06/13/16 0530  BNP 528.3*    ProBNP (last 3 results) No results for input(s): PROBNP in the last 8760 hours.  CBG: No results for input(s): GLUCAP in the last 168 hours.  Signed:  Penny Pia MD.  [facility] 07/17/2016, 1:38 PM

## 2016-07-17 NOTE — Progress Notes (Addendum)
ANTICOAGULATION CONSULT NOTE  Pharmacy Consult for warfarin Indication: atrial fibrillation  Allergies  Allergen Reactions  . Nubain [Nalbuphine Hcl] Hives    Went into cardiac arrest   . Codeine Hives and Nausea Only  . Darvocet [Propoxyphene N-Acetaminophen] Nausea Only     Vital Signs: Temp: 98.6 F (37 C) (03/29 0949) Temp Source: Oral (03/29 0949) BP: 136/56 (03/29 0949) Pulse Rate: 79 (03/29 0949)  Labs:  Recent Labs  07/14/16 1823 07/15/16 0626 07/16/16 0701 07/17/16 0552  HGB 10.8* 10.1* 9.6*  --   HCT 32.8* 30.5* 28.9*  --   PLT 227 183 200  --   LABPROT 40.3* 32.3* 32.6* 29.0*  INR 4.03* 3.06 3.10 2.67  CREATININE 2.34* 2.28* 2.32*  --     Estimated Creatinine Clearance: 16.9 mL/min (A) (by C-G formula based on SCr of 2.32 mg/dL (H)).  Assessment: 83 yo female admitted 3/26 with fall and subsequent head contusion. CT with no acute bleed.   Admission INR was supratherapeutic at 4.03. Noted in H&P that patient had recent INR of 6 (cannot see this value on file)- uncertain who manages patient's warfarin, as there is a telephone note from [address] office stating they DO NOT manage warfarin. There are no other notes indicating warfarin prescriber. Noted patient is seen by Dr. Wilhemina Cash of [facility].  INR down to 2.67. No overt bleeding noted.  Home dose 4mg  daily with last dose taken on 3/25 pm.   Goal of Therapy:  INR 2-3 Monitor platelets by anticoagulation protocol: Yes   Plan:  Restart warfarin at 2mg  daily with an INR check on 3/31 or 4/2 at SNF Will attempt to contact patient's PCP and son to communicate recommendations  Holly Pring D. Lyne Khurana, PharmD, BCPS Clinical Pharmacist Pager: [phone] 07/17/2016 10:06 AM    ADDENDUM Spoke with Dr. Will Bonnet over the phone who confirmed patient has her INR checked at [facility] and results are called to NP. She stated that she will follow up on long term warfarin plan (risk vs  benefit as well as reason for falls) with patient and hopefully son if he is able to be at the facility during the readmission visit. I will communicate this with the son. Informed her of my plan to resume warfarin at lower dose which she was in agreement with, so long as the inpatient hospitalist and/or cardiologist did not see reason to stop warfarin at discharge.  Spoke with patient's son over the phone and informed him of the plan for his mom to resume warfarin at a lower dose and have her INR checked at [facility]. Also informed him of Dr. Hazle Quant plan to follow up with him and his mom when she is back at [facility] for long term risk vs benefit discussion around warfarin.  Son told me he was informed Dr. Excell Seltzer would be in contact with attending physician while patient is in the hospital about plan- I told him I did not see a note documenting this, so I could not speak to that. Son also expressed concerns that patient has fluctuations in INR based on whether she is receiving brand name Coumadin or generic warfarin.  Maris Bena D. Praneeth Bussey, PharmD, BCPS Clinical Pharmacist Pager: [phone] 07/17/2016 11:50 AM

## 2016-07-17 NOTE — [place] FL2 (Signed)
[place] MEDICAID FL2 LEVEL OF CARE SCREENING TOOL     IDENTIFICATION  Patient Name: [name] Birthdate: 07/24/1932 Sex: female Admission Date (Current Location): 07/14/2016  [facility] and [place] Number:  Producer, television/film/video and Address:  [facility] [place], [address] [place], [place] [address]      Provider Number: [id number]  Attending Physician Name and Address:  Penny Pia, MD  Relative Name and Phone Number:  Baelyn Banos, son - [phone] (mobile) and [name] - daughter    Current Level of Care: Hospital Recommended Level of Care: Skilled Nursing Facility (Patient from [facility]) Prior Approval Number:    Date Approved/Denied:   PASRR Number:    Discharge Plan: SNF    Current Diagnoses: Patient Active Problem List   Diagnosis Date Noted  . Head contusion 07/15/2016  . Fall 07/14/2016  . Chest pain at rest   . Acute on chronic combined systolic and diastolic congestive heart failure (HCC)   . Unstable angina (HCC)   . Acute respiratory failure (HCC) 06/13/2016  . Elevated blood sugar 06/13/2016  . Dementia without behavioral disturbance 01/09/2016  . Chronic constipation 01/09/2016  . Neuropathic pain 01/09/2016  . Esophageal reflux 01/09/2016  . Hypothyroidism 01/09/2016  . CKD (chronic kidney disease) stage 4, GFR 15-29 ml/min (HCC) 01/09/2016  . Right patellar tendon rupture 06/25/2015  . Avulsion of right patellar tendon 03/08/2015  . Aspiration pneumonia (HCC) 12/14/2014  . Anemia of chronic disease 12/14/2014  . Aspiration into airway 12/14/2014  . Swallowing dysfunction 12/14/2014  . S/P right TKA 12/04/2014  . S/P knee replacement 12/04/2014  . Acute blood loss anemia 12/28/2012  . Thrombocytopenia (HCC) 12/28/2012  . Toe fracture 12/28/2012  . CKD (chronic kidney disease), stage III 12/28/2012  . Low back pain 12/28/2012  . Wound dehiscence, surgical 12/28/2012  . Atelectasis 12/28/2012  . S/P TAVR  (transcatheter aortic valve replacement) 12/21/2012  . Severe aortic stenosis 12/06/2012  . Aortic stenosis 10/13/2012  . Acute on chronic systolic and diastolic heart failure, NYHA class 3 (HCC) 10/13/2012  . Pacemaker 10/15/2010  . Paroxysmal atrial fibrillation (HCC) 10/15/2010  . Dyslipidemia 10/15/2010  . Hypertension 10/15/2010  . Chronic diastolic heart failure (HCC) 10/15/2010    Orientation RESPIRATION BLADDER Height & Weight     Self, Time, Place  Normal Continent Weight: 179 lb 14.3 oz (81.6 kg) Height:  4\' 10"  (147.3 cm)  BEHAVIORAL SYMPTOMS/MOOD NEUROLOGICAL BOWEL NUTRITION STATUS      Continent Diet (Heart healthy)  AMBULATORY STATUS COMMUNICATION OF NEEDS Skin   Limited Assist Verbally Other (Comment) (Abrasion on nose, jaw, head; Ecchymosis on abdomen, arm and shoulder; Skin tear on mid-lower back.)                       Personal Care Assistance Level of Assistance  Bathing, Feeding, Dressing Bathing Assistance: Limited assistance   Dressing Assistance: Limited assistance     Functional Limitations Info  Sight, Hearing, Speech Sight Info: Adequate Hearing Info: Impaired Speech Info: Adequate    SPECIAL CARE FACTORS FREQUENCY                       Contractures Contractures Info: Not present    Additional Factors Info  Code Status Code Status Info: Full Allergies Info: Nubain, Codeine, Darvvocet           Current Medications (07/17/2016):  This is the current hospital active medication list  Current Facility-Administered Medications  Medication Dose Route Frequency Provider Last Rate Last Dose  . acetaminophen (TYLENOL) tablet 650 mg  650 mg Oral Q6H PRN Clydie Braun, MD       Or  . acetaminophen (TYLENOL) suppository 650 mg  650 mg Rectal Q6H PRN Clydie Braun, MD      . amiodarone (PACERONE) tablet 100 mg  100 mg Oral Daily Clydie Braun, MD   100 mg at 07/17/16 1059  . atorvastatin (LIPITOR) tablet 10 mg  10 mg Oral q1800  Clydie Braun, MD   10 mg at 07/16/16 1737  . bisacodyl (DULCOLAX) suppository 10 mg  10 mg Rectal Daily PRN Clydie Braun, MD      . calcitRIOL (ROCALTROL) capsule 0.25 mcg  0.25 mcg Oral Daily Clydie Braun, MD   0.25 mcg at 07/17/16 1059  . camphor-menthol (SARNA) lotion   Topical Daily PRN Clydie Braun, MD      . carvedilol (COREG) tablet 3.125 mg  3.125 mg Oral BID WC Penny Pia, MD   3.125 mg at 07/17/16 0854  . cycloSPORINE (RESTASIS) 0.05 % ophthalmic emulsion 1 drop  1 drop Both Eyes BID Clydie Braun, MD   1 drop at 07/17/16 1058  . diphenhydrAMINE (BENADRYL) capsule 25 mg  25 mg Oral Q4H PRN Leanne Chang, NP   25 mg at 07/16/16 2307  . donepezil (ARICEPT) tablet 10 mg  10 mg Oral QHS Clydie Braun, MD   10 mg at 07/16/16 2308  . DULoxetine (CYMBALTA) DR capsule 60 mg  60 mg Oral q1800 Clydie Braun, MD   60 mg at 07/16/16 1737  . febuxostat (ULORIC) tablet 40 mg  40 mg Oral q1800 Clydie Braun, MD   40 mg at 07/16/16 1736  . ferrous sulfate tablet 325 mg  325 mg Oral Daily Clydie Braun, MD   325 mg at 07/17/16 1059  . hydrALAZINE (APRESOLINE) tablet 10 mg  10 mg Oral TID Penny Pia, MD   10 mg at 07/17/16 1059  . HYDROcodone-acetaminophen (NORCO) 7.5-325 MG per tablet 1-2 tablet  1-2 tablet Oral Q4H PRN Clydie Braun, MD   1 tablet at 07/17/16 0854  . ipratropium-albuterol (DUONEB) 0.5-2.5 (3) MG/3ML nebulizer solution 3 mL  3 mL Nebulization Q2H PRN Rondell A Katrinka Blazing, MD      . levothyroxine (SYNTHROID, LEVOTHROID) tablet 112 mcg  112 mcg Oral QAC breakfast Clydie Braun, MD   112 mcg at 07/17/16 0830  . linaclotide (LINZESS) capsule 145 mcg  145 mcg Oral Daily Clydie Braun, MD   145 mcg at 07/17/16 1059  . memantine (NAMENDA) tablet 5 mg  5 mg Oral BID Clydie Braun, MD   5 mg at 07/17/16 1059  . MUSCLE RUB CREA   Topical PRN Clydie Braun, MD      . nitroGLYCERIN (NITROSTAT) SL tablet 0.4 mg  0.4 mg Sublingual Q5 min PRN Clydie Braun, MD       . ondansetron (ZOFRAN) tablet 4 mg  4 mg Oral Q6H PRN Clydie Braun, MD   4 mg at 07/15/16 2122   Or  . ondansetron (ZOFRAN) injection 4 mg  4 mg Intravenous Q6H PRN Clydie Braun, MD      . pantoprazole (PROTONIX) EC tablet 40 mg  40 mg Oral Daily Clydie Braun, MD   40 mg at 07/17/16 1059  . polyethylene glycol (MIRALAX / GLYCOLAX) packet  17 g  17 g Oral BID Clydie Braun, MD   17 g at 07/15/16 1015  . pregabalin (LYRICA) capsule 50 mg  50 mg Oral 3 times per day Clydie Braun, MD   50 mg at 07/17/16 1610  . senna (SENOKOT) tablet 17.2 mg  2 tablet Oral BID Clydie Braun, MD   17.2 mg at 07/17/16 1059  . simethicone (MYLICON) chewable tablet 80 mg  80 mg Oral Q12H PRN Clydie Braun, MD      . sodium chloride (OCEAN) 0.65 % nasal spray 1 spray  1 spray Each Nare PRN Maryann Mikhail, DO   1 spray at 07/16/16 1631  . sodium chloride flush (NS) 0.9 % injection 3 mL  3 mL Intravenous Q12H Rondell Burtis Junes, MD   3 mL at 07/17/16 1100  . warfarin (COUMADIN) tablet 2 mg  2 mg Oral q1800 Lauren D Bajbus, RPH      . Warfarin - Pharmacist Dosing Inpatient   Does not apply q1800 Lauren D Bajbus, RPH   Stopped at 07/15/16 1800   Facility-Administered Medications Ordered in Other Encounters  Medication Dose Route Frequency Provider Last Rate Last Dose  . sodium bicarbonate first hour bolus via infusion   Intravenous Once Pearson Grippe, MD         Discharge Medications: Please see discharge summary for a list of discharge medications.  Relevant Imaging Results:  Relevant Lab Results:   Additional Information ss#[id number].   [name], LCSW

## 2016-07-17 NOTE — Progress Notes (Signed)
Patient discharged to [facility] via ambulance.  She took all her belongings with her.  Gave report to [employer] (nurse) and answered all her questions.  Discontinued IV, site clean and dry.  Tele discontinued, CCMD notified.

## 2016-07-17 NOTE — Clinical Social Work Note (Signed)
Clinical Social Work Assessment  Patient Details  Name: [name] MRN: [id number] Date of Birth: [birth year]  Date of referral:  07/14/16               Reason for consult:  Facility Placement                Permission sought to share information with:  Family Supports Permission granted to share information::  Yes, Verbal Permission Granted  Name::     [name]  Agency::     Relationship::  Son  Solicitor Information:  [phone] (mobile) or [phone]  Housing/Transportation Living arrangements for the past 2 months:  Skilled Nursing Facility Psychiatric nurse) Source of Information:  Patient Patient Interpreter Needed:    Criminal Activity/Legal Involvement Pertinent to Current Situation/Hospitalization:  No - Comment as needed Significant Relationships:  Adult Children, Other Family Members Lives with:  Facility Resident [facility]) Do you feel safe going back to the place where you live?  Yes Need for family participation in patient care:  Yes (Comment)  Care giving concerns:  Patient reported that since facility has changed ownership the food is not as good and [name] expressed frustration regarding not being able to walk when she wants to, as they don't want residents walking unless a staff person is with them.  Social Worker assessment / plan:  CSW talked with patient at the bedside regarding d/c disposition. [name] was lying in bed and was alert, oriented, and open to sharing with CSW regarding her facility placement and family. Patient is aware that she is discharging today and wanted facility [facility] to pick her up. CSW contacted admissions staff person (while in room) and confirmed that they do not pick up from hospital.  Ms. Champeau talked with CSW regarding her time at facility, a little over a year per patient. Patient also talked about her family - 1 son, [name] and one daughter, [name]. Patient has one granddaughter, 81 years old, one  brother who lives in [place] and one sister who lives in [place], [place]. CSW listened attentively and responded appropriately as she shared.   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  Patient/Family's Understanding of and Emotional Response to Diagnosis, Current Treatment, and Prognosis:  Not discussed.  Emotional Assessment Appearance:  Appears stated age Attitude/Demeanor/Rapport:  Other (Appropriate) Affect (typically observed):  Appropriate, Pleasant Orientation:  Oriented to Self, Oriented to Place, Oriented to  Time, Oriented to Situation Alcohol / Substance use:  Never Used Psych involvement (Current and /or in the community):  No (Comment)  Discharge Needs  Concerns to be addressed:  Discharge Planning Concerns Readmission within the last 30 days:  No Current discharge risk:  None Barriers to Discharge:  No Barriers Identified   [name], LCSW 07/17/2016, 12:36 PM

## 2016-07-18 NOTE — Progress Notes (Signed)
07/18/2016 9:26 AM  Patient son [name] called to inquire if his mother had discharged yesterday. Confirmed with son that patient discharged yesterday 3/29 around 1920 to [facility] via ambulance. Son Onalee Hua had no other questions or concerns.   Disha Cottam [employer], RN-BC, [employer] Phone [phone]

## 2016-07-21 ENCOUNTER — Encounter (HOSPITAL_COMMUNITY): Payer: Self-pay | Admitting: *Deleted

## 2016-07-21 ENCOUNTER — Telehealth: Payer: Self-pay

## 2016-07-21 ENCOUNTER — Emergency Department (HOSPITAL_COMMUNITY): Payer: Medicare Other

## 2016-07-21 ENCOUNTER — Inpatient Hospital Stay (HOSPITAL_COMMUNITY)
Admission: EM | Admit: 2016-07-21 | Discharge: 2016-07-30 | DRG: 291 | Disposition: A | Discharge status: Skilled Nursing Facility | Payer: Medicare Other | Attending: Internal Medicine | Admitting: Internal Medicine

## 2016-07-21 DIAGNOSIS — M6281 Muscle weakness (generalized): Secondary | ICD-10-CM | POA: Diagnosis not present

## 2016-07-21 DIAGNOSIS — I2510 Atherosclerotic heart disease of native coronary artery without angina pectoris: Secondary | ICD-10-CM | POA: Diagnosis present

## 2016-07-21 DIAGNOSIS — R072 Precordial pain: Secondary | ICD-10-CM

## 2016-07-21 DIAGNOSIS — Z79899 Other long term (current) drug therapy: Secondary | ICD-10-CM

## 2016-07-21 DIAGNOSIS — I130 Hypertensive heart and chronic kidney disease with heart failure and stage 1 through stage 4 chronic kidney disease, or unspecified chronic kidney disease: Principal | ICD-10-CM | POA: Diagnosis present

## 2016-07-21 DIAGNOSIS — E785 Hyperlipidemia, unspecified: Secondary | ICD-10-CM | POA: Diagnosis present

## 2016-07-21 DIAGNOSIS — R2681 Unsteadiness on feet: Secondary | ICD-10-CM | POA: Diagnosis not present

## 2016-07-21 DIAGNOSIS — G629 Polyneuropathy, unspecified: Secondary | ICD-10-CM | POA: Diagnosis present

## 2016-07-21 DIAGNOSIS — J45909 Unspecified asthma, uncomplicated: Secondary | ICD-10-CM | POA: Diagnosis not present

## 2016-07-21 DIAGNOSIS — K219 Gastro-esophageal reflux disease without esophagitis: Secondary | ICD-10-CM | POA: Diagnosis present

## 2016-07-21 DIAGNOSIS — Y95 Nosocomial condition: Secondary | ICD-10-CM | POA: Diagnosis present

## 2016-07-21 DIAGNOSIS — R296 Repeated falls: Secondary | ICD-10-CM | POA: Diagnosis present

## 2016-07-21 DIAGNOSIS — M797 Fibromyalgia: Secondary | ICD-10-CM | POA: Diagnosis present

## 2016-07-21 DIAGNOSIS — F0390 Unspecified dementia without behavioral disturbance: Secondary | ICD-10-CM | POA: Diagnosis present

## 2016-07-21 DIAGNOSIS — I495 Sick sinus syndrome: Secondary | ICD-10-CM | POA: Diagnosis present

## 2016-07-21 DIAGNOSIS — G8929 Other chronic pain: Secondary | ICD-10-CM | POA: Diagnosis not present

## 2016-07-21 DIAGNOSIS — R2689 Other abnormalities of gait and mobility: Secondary | ICD-10-CM | POA: Diagnosis not present

## 2016-07-21 DIAGNOSIS — I10 Essential (primary) hypertension: Secondary | ICD-10-CM

## 2016-07-21 DIAGNOSIS — I5043 Acute on chronic combined systolic (congestive) and diastolic (congestive) heart failure: Secondary | ICD-10-CM

## 2016-07-21 DIAGNOSIS — Z7983 Long term (current) use of bisphosphonates: Secondary | ICD-10-CM

## 2016-07-21 DIAGNOSIS — I509 Heart failure, unspecified: Secondary | ICD-10-CM | POA: Diagnosis not present

## 2016-07-21 DIAGNOSIS — I447 Left bundle-branch block, unspecified: Secondary | ICD-10-CM | POA: Diagnosis not present

## 2016-07-21 DIAGNOSIS — J9690 Respiratory failure, unspecified, unspecified whether with hypoxia or hypercapnia: Secondary | ICD-10-CM | POA: Insufficient documentation

## 2016-07-21 DIAGNOSIS — F329 Major depressive disorder, single episode, unspecified: Secondary | ICD-10-CM | POA: Diagnosis present

## 2016-07-21 DIAGNOSIS — I482 Chronic atrial fibrillation: Secondary | ICD-10-CM | POA: Diagnosis present

## 2016-07-21 DIAGNOSIS — Z888 Allergy status to other drugs, medicaments and biological substances status: Secondary | ICD-10-CM

## 2016-07-21 DIAGNOSIS — Z9981 Dependence on supplemental oxygen: Secondary | ICD-10-CM

## 2016-07-21 DIAGNOSIS — J9601 Acute respiratory failure with hypoxia: Secondary | ICD-10-CM | POA: Diagnosis not present

## 2016-07-21 DIAGNOSIS — J9621 Acute and chronic respiratory failure with hypoxia: Secondary | ICD-10-CM

## 2016-07-21 DIAGNOSIS — J810 Acute pulmonary edema: Secondary | ICD-10-CM

## 2016-07-21 DIAGNOSIS — K5900 Constipation, unspecified: Secondary | ICD-10-CM | POA: Diagnosis present

## 2016-07-21 DIAGNOSIS — R0902 Hypoxemia: Secondary | ICD-10-CM

## 2016-07-21 DIAGNOSIS — R1032 Left lower quadrant pain: Secondary | ICD-10-CM | POA: Diagnosis not present

## 2016-07-21 DIAGNOSIS — N183 Chronic kidney disease, stage 3 unspecified: Secondary | ICD-10-CM | POA: Diagnosis present

## 2016-07-21 DIAGNOSIS — R0602 Shortness of breath: Secondary | ICD-10-CM | POA: Diagnosis not present

## 2016-07-21 DIAGNOSIS — J80 Acute respiratory distress syndrome: Secondary | ICD-10-CM | POA: Diagnosis not present

## 2016-07-21 DIAGNOSIS — Z96651 Presence of right artificial knee joint: Secondary | ICD-10-CM | POA: Diagnosis not present

## 2016-07-21 DIAGNOSIS — M109 Gout, unspecified: Secondary | ICD-10-CM | POA: Diagnosis present

## 2016-07-21 DIAGNOSIS — J9602 Acute respiratory failure with hypercapnia: Secondary | ICD-10-CM | POA: Diagnosis not present

## 2016-07-21 DIAGNOSIS — E039 Hypothyroidism, unspecified: Secondary | ICD-10-CM | POA: Diagnosis present

## 2016-07-21 DIAGNOSIS — I480 Paroxysmal atrial fibrillation: Secondary | ICD-10-CM

## 2016-07-21 DIAGNOSIS — Z8673 Personal history of transient ischemic attack (TIA), and cerebral infarction without residual deficits: Secondary | ICD-10-CM

## 2016-07-21 DIAGNOSIS — I5023 Acute on chronic systolic (congestive) heart failure: Secondary | ICD-10-CM | POA: Diagnosis present

## 2016-07-21 DIAGNOSIS — J189 Pneumonia, unspecified organism: Secondary | ICD-10-CM | POA: Diagnosis not present

## 2016-07-21 DIAGNOSIS — Z950 Presence of cardiac pacemaker: Secondary | ICD-10-CM

## 2016-07-21 DIAGNOSIS — Z952 Presence of prosthetic heart valve: Secondary | ICD-10-CM

## 2016-07-21 DIAGNOSIS — Z885 Allergy status to narcotic agent status: Secondary | ICD-10-CM

## 2016-07-21 DIAGNOSIS — N184 Chronic kidney disease, stage 4 (severe): Secondary | ICD-10-CM | POA: Diagnosis not present

## 2016-07-21 DIAGNOSIS — Z7901 Long term (current) use of anticoagulants: Secondary | ICD-10-CM

## 2016-07-21 LAB — I-STAT ARTERIAL BLOOD GAS, ED
ACID-BASE DEFICIT: 5 mmol/L — AB (ref 0.0–2.0)
BICARBONATE: 21.3 mmol/L (ref 20.0–28.0)
O2 Saturation: 94 %
TCO2: 23 mmol/L (ref 0–100)
pCO2 arterial: 41.6 mmHg (ref 32.0–48.0)
pH, Arterial: 7.316 — ABNORMAL LOW (ref 7.350–7.450)
pO2, Arterial: 78 mmHg — ABNORMAL LOW (ref 83.0–108.0)

## 2016-07-21 LAB — BASIC METABOLIC PANEL
ANION GAP: 15 (ref 5–15)
BUN: 24 mg/dL — ABNORMAL HIGH (ref 6–20)
CHLORIDE: 107 mmol/L (ref 101–111)
CO2: 22 mmol/L (ref 22–32)
CREATININE: 1.78 mg/dL — AB (ref 0.44–1.00)
Calcium: 9.8 mg/dL (ref 8.9–10.3)
GFR calc non Af Amer: 25 mL/min — ABNORMAL LOW (ref >60)
GFR, EST AFRICAN AMERICAN: 29 mL/min — AB (ref >60)
Glucose, Bld: 271 mg/dL — ABNORMAL HIGH (ref 65–99)
Potassium: 3.5 mmol/L (ref 3.5–5.1)
SODIUM: 144 mmol/L (ref 135–145)

## 2016-07-21 LAB — CBC WITH DIFFERENTIAL/PLATELET
Basophils Absolute: 0 10*3/uL (ref 0.0–0.1)
Basophils Relative: 0 %
EOS ABS: 0.2 10*3/uL (ref 0.0–0.7)
EOS PCT: 1 %
HCT: 39.3 % (ref 36.0–46.0)
Hemoglobin: 12.4 g/dL (ref 12.0–15.0)
LYMPHS ABS: 2.5 10*3/uL (ref 0.7–4.0)
LYMPHS PCT: 19 %
MCH: 30.2 pg (ref 26.0–34.0)
MCHC: 31.6 g/dL (ref 30.0–36.0)
MCV: 95.6 fL (ref 78.0–100.0)
Monocytes Absolute: 0.8 10*3/uL (ref 0.1–1.0)
Monocytes Relative: 6 %
NEUTROS PCT: 74 %
Neutro Abs: 9.8 10*3/uL — ABNORMAL HIGH (ref 1.7–7.7)
PLATELETS: 305 10*3/uL (ref 150–400)
RBC: 4.11 MIL/uL (ref 3.87–5.11)
RDW: 15.9 % — ABNORMAL HIGH (ref 11.5–15.5)
WBC: 13.3 10*3/uL — AB (ref 4.0–10.5)

## 2016-07-21 LAB — RESPIRATORY PANEL BY PCR
Adenovirus: NOT DETECTED
BORDETELLA PERTUSSIS-RVPCR: NOT DETECTED
CHLAMYDOPHILA PNEUMONIAE-RVPPCR: NOT DETECTED
CORONAVIRUS 229E-RVPPCR: NOT DETECTED
CORONAVIRUS HKU1-RVPPCR: NOT DETECTED
CORONAVIRUS NL63-RVPPCR: NOT DETECTED
Coronavirus OC43: NOT DETECTED
INFLUENZA A H1 2009-RVPPR: NOT DETECTED
INFLUENZA A H3-RVPPCR: NOT DETECTED
INFLUENZA B-RVPPCR: NOT DETECTED
Influenza A H1: NOT DETECTED
Influenza A: NOT DETECTED
MYCOPLASMA PNEUMONIAE-RVPPCR: NOT DETECTED
Metapneumovirus: NOT DETECTED
Parainfluenza Virus 1: NOT DETECTED
Parainfluenza Virus 2: NOT DETECTED
Parainfluenza Virus 3: NOT DETECTED
Parainfluenza Virus 4: NOT DETECTED
RHINOVIRUS / ENTEROVIRUS - RVPPCR: NOT DETECTED
Respiratory Syncytial Virus: NOT DETECTED

## 2016-07-21 LAB — PROCALCITONIN: PROCALCITONIN: 2.34 ng/mL

## 2016-07-21 LAB — I-STAT TROPONIN, ED: TROPONIN I, POC: 0.07 ng/mL (ref 0.00–0.08)

## 2016-07-21 LAB — PROTIME-INR
INR: 1.72
Prothrombin Time: 20.4 seconds — ABNORMAL HIGH (ref 11.4–15.2)

## 2016-07-21 LAB — BRAIN NATRIURETIC PEPTIDE: B NATRIURETIC PEPTIDE 5: 1636.9 pg/mL — AB (ref 0.0–100.0)

## 2016-07-21 LAB — I-STAT CG4 LACTIC ACID, ED: Lactic Acid, Venous: 5.12 mmol/L (ref 0.5–1.9)

## 2016-07-21 LAB — LACTIC ACID, PLASMA: LACTIC ACID, VENOUS: 2.6 mmol/L — AB (ref 0.5–1.9)

## 2016-07-21 LAB — CBG MONITORING, ED: Glucose-Capillary: 259 mg/dL — ABNORMAL HIGH (ref 65–99)

## 2016-07-21 MED ORDER — WARFARIN SODIUM 3 MG PO TABS
3.0000 mg | ORAL_TABLET | Freq: Once | ORAL | Status: AC
  Administered 2016-07-21: 3 mg via ORAL
  Filled 2016-07-21: qty 1

## 2016-07-21 MED ORDER — ALENDRONATE SODIUM 70 MG/75ML PO SOLN: 70.0000 mg | ORAL | Status: DC

## 2016-07-21 MED ORDER — PRAMOXINE HCL 1 % EX LOTN
1.0000 "application " | TOPICAL_LOTION | Freq: Every day | CUTANEOUS | Status: DC | PRN
  Filled 2016-07-21: qty 1

## 2016-07-21 MED ORDER — PREGABALIN 25 MG PO CAPS
50.0000 mg | ORAL_CAPSULE | Freq: Two times a day (BID) | ORAL | Status: DC | PRN
  Administered 2016-07-23 – 2016-07-29 (×6): 50 mg via ORAL
  Filled 2016-07-21: qty 2
  Filled 2016-07-21: qty 1
  Filled 2016-07-21 (×2): qty 2
  Filled 2016-07-21: qty 1
  Filled 2016-07-21: qty 2

## 2016-07-21 MED ORDER — FUROSEMIDE 10 MG/ML IJ SOLN
60.0000 mg | Freq: Once | INTRAMUSCULAR | Status: AC
  Administered 2016-07-21: 60 mg via INTRAVENOUS
  Filled 2016-07-21: qty 6

## 2016-07-21 MED ORDER — ONDANSETRON HCL 4 MG/2ML IJ SOLN: 4.0000 mg | Freq: Once | INTRAMUSCULAR | Status: DC

## 2016-07-21 MED ORDER — WARFARIN - PHARMACIST DOSING INPATIENT
Freq: Every day | Status: DC
  Administered 2016-07-24 – 2016-07-29 (×3)

## 2016-07-21 MED ORDER — ALBUTEROL SULFATE (2.5 MG/3ML) 0.083% IN NEBU: 2.5000 mg | INHALATION_SOLUTION | RESPIRATORY_TRACT | Status: DC | PRN

## 2016-07-21 MED ORDER — ATORVASTATIN CALCIUM 10 MG PO TABS
10.0000 mg | ORAL_TABLET | Freq: Every day | ORAL | Status: DC
  Administered 2016-07-21 – 2016-07-29 (×9): 10 mg via ORAL
  Filled 2016-07-21 (×8): qty 1

## 2016-07-21 MED ORDER — LEVOTHYROXINE SODIUM 112 MCG PO TABS
112.0000 ug | ORAL_TABLET | Freq: Every day | ORAL | Status: DC
  Administered 2016-07-22 – 2016-07-30 (×9): 112 ug via ORAL
  Filled 2016-07-21 (×9): qty 1

## 2016-07-21 MED ORDER — SODIUM CHLORIDE 0.9 % IV SOLN: 1250.0000 mg | INTRAVENOUS | Status: DC

## 2016-07-21 MED ORDER — NITROGLYCERIN 0.4 MG SL SUBL: 0.4000 mg | SUBLINGUAL_TABLET | SUBLINGUAL | Status: DC | PRN

## 2016-07-21 MED ORDER — IPRATROPIUM-ALBUTEROL 0.5-2.5 (3) MG/3ML IN SOLN
3.0000 mL | Freq: Four times a day (QID) | RESPIRATORY_TRACT | Status: DC
  Administered 2016-07-21 – 2016-07-22 (×3): 3 mL via RESPIRATORY_TRACT
  Filled 2016-07-21 (×3): qty 3

## 2016-07-21 MED ORDER — DEXTROSE 5 % IV SOLN: 1.0000 g | INTRAVENOUS | Status: DC

## 2016-07-21 MED ORDER — DULOXETINE HCL 60 MG PO CPEP
60.0000 mg | ORAL_CAPSULE | Freq: Every day | ORAL | Status: DC
  Administered 2016-07-22 – 2016-07-29 (×8): 60 mg via ORAL
  Filled 2016-07-21 (×8): qty 1

## 2016-07-21 MED ORDER — VANCOMYCIN HCL IN DEXTROSE 1-5 GM/200ML-% IV SOLN: 1000.0000 mg | Freq: Once | INTRAVENOUS | Status: DC

## 2016-07-21 MED ORDER — TORSEMIDE 20 MG PO TABS
20.0000 mg | ORAL_TABLET | Freq: Every day | ORAL | Status: DC
Start: 2016-07-22 — End: 2016-07-24
  Administered 2016-07-21 – 2016-07-24 (×4): 20 mg via ORAL
  Filled 2016-07-21 (×4): qty 1

## 2016-07-21 MED ORDER — SODIUM CHLORIDE 0.9 % IV SOLN
1750.0000 mg | Freq: Once | INTRAVENOUS | Status: DC
  Filled 2016-07-21: qty 1750

## 2016-07-21 MED ORDER — ACETAMINOPHEN 325 MG PO TABS
650.0000 mg | ORAL_TABLET | Freq: Four times a day (QID) | ORAL | Status: DC | PRN
  Administered 2016-07-25: 650 mg via ORAL
  Filled 2016-07-21 (×2): qty 2

## 2016-07-21 MED ORDER — FERROUS SULFATE 325 (65 FE) MG PO TABS
325.0000 mg | ORAL_TABLET | Freq: Every day | ORAL | Status: DC
  Administered 2016-07-22 – 2016-07-30 (×9): 325 mg via ORAL
  Filled 2016-07-21 (×9): qty 1

## 2016-07-21 MED ORDER — ONDANSETRON HCL 4 MG PO TABS
4.0000 mg | ORAL_TABLET | Freq: Four times a day (QID) | ORAL | Status: DC | PRN
  Administered 2016-07-24 – 2016-07-30 (×4): 4 mg via ORAL
  Filled 2016-07-21 (×4): qty 1

## 2016-07-21 MED ORDER — SODIUM CHLORIDE 0.9 % IV SOLN
INTRAVENOUS | Status: DC
  Administered 2016-07-21 – 2016-07-23 (×2): via INTRAVENOUS

## 2016-07-21 MED ORDER — LINACLOTIDE 145 MCG PO CAPS
145.0000 ug | ORAL_CAPSULE | Freq: Every day | ORAL | Status: DC
  Administered 2016-07-22 – 2016-07-24 (×3): 145 ug via ORAL
  Filled 2016-07-21 (×4): qty 1

## 2016-07-21 MED ORDER — NITROGLYCERIN 2 % TD OINT
1.0000 [in_us] | TOPICAL_OINTMENT | Freq: Once | TRANSDERMAL | Status: AC
  Administered 2016-07-21: 1 [in_us] via TOPICAL

## 2016-07-21 MED ORDER — CEFEPIME HCL 2 G IJ SOLR: 2.0000 g | Freq: Once | INTRAMUSCULAR | Status: DC

## 2016-07-21 MED ORDER — AMIODARONE HCL 100 MG PO TABS
100.0000 mg | ORAL_TABLET | Freq: Every day | ORAL | Status: DC
  Administered 2016-07-21 – 2016-07-30 (×10): 100 mg via ORAL
  Filled 2016-07-21 (×10): qty 1

## 2016-07-21 MED ORDER — BIOTIN 5000 MCG PO CAPS: 5000.0000 ug | ORAL_CAPSULE | Freq: Every day | ORAL | Status: DC

## 2016-07-21 MED ORDER — LEVOTHYROXINE SODIUM 112 MCG PO TABS: 112.0000 ug | ORAL_TABLET | Freq: Every day | ORAL | Status: DC

## 2016-07-21 MED ORDER — CYCLOSPORINE 0.05 % OP EMUL
1.0000 [drp] | Freq: Two times a day (BID) | OPHTHALMIC | Status: DC
Start: 2016-07-21 — End: 2016-07-30
  Administered 2016-07-21 – 2016-07-30 (×18): 1 [drp] via OPHTHALMIC
  Filled 2016-07-21 (×20): qty 1

## 2016-07-21 MED ORDER — PANTOPRAZOLE SODIUM 40 MG PO TBEC
80.0000 mg | DELAYED_RELEASE_TABLET | Freq: Every day | ORAL | Status: DC
  Administered 2016-07-21 – 2016-07-30 (×10): 80 mg via ORAL
  Filled 2016-07-21 (×9): qty 2

## 2016-07-21 MED ORDER — HYDRALAZINE HCL 10 MG PO TABS
10.0000 mg | ORAL_TABLET | Freq: Three times a day (TID) | ORAL | Status: DC
  Administered 2016-07-21 – 2016-07-30 (×27): 10 mg via ORAL
  Filled 2016-07-21 (×28): qty 1

## 2016-07-21 MED ORDER — ONDANSETRON HCL 4 MG/2ML IJ SOLN: 4.0000 mg | Freq: Four times a day (QID) | INTRAMUSCULAR | Status: DC | PRN

## 2016-07-21 MED ORDER — ACETAMINOPHEN 650 MG RE SUPP: 650.0000 mg | Freq: Four times a day (QID) | RECTAL | Status: DC | PRN

## 2016-07-21 MED ORDER — MEMANTINE HCL 10 MG PO TABS
5.0000 mg | ORAL_TABLET | Freq: Two times a day (BID) | ORAL | Status: DC
  Administered 2016-07-21 – 2016-07-30 (×19): 5 mg via ORAL
  Filled 2016-07-21 (×20): qty 1

## 2016-07-21 MED ORDER — CARVEDILOL 3.125 MG PO TABS
3.1250 mg | ORAL_TABLET | Freq: Two times a day (BID) | ORAL | Status: DC
  Administered 2016-07-21 – 2016-07-23 (×5): 3.125 mg via ORAL
  Filled 2016-07-21 (×6): qty 1

## 2016-07-21 MED ORDER — ONDANSETRON HCL 4 MG/2ML IJ SOLN
INTRAMUSCULAR | Status: AC
  Administered 2016-07-21: 4 mg
  Filled 2016-07-21: qty 2

## 2016-07-21 MED ORDER — LEVOFLOXACIN IN D5W 500 MG/100ML IV SOLN
500.0000 mg | INTRAVENOUS | Status: DC
  Administered 2016-07-21 – 2016-07-23 (×2): 500 mg via INTRAVENOUS
  Filled 2016-07-21 (×2): qty 100

## 2016-07-21 MED ORDER — DONEPEZIL HCL 10 MG PO TABS
10.0000 mg | ORAL_TABLET | Freq: Every day | ORAL | Status: DC
  Administered 2016-07-21 – 2016-07-29 (×9): 10 mg via ORAL
  Filled 2016-07-21 (×10): qty 1

## 2016-07-21 MED ORDER — FEBUXOSTAT 40 MG PO TABS
40.0000 mg | ORAL_TABLET | Freq: Every day | ORAL | Status: DC
  Administered 2016-07-22 – 2016-07-29 (×8): 40 mg via ORAL
  Filled 2016-07-21 (×9): qty 1

## 2016-07-21 NOTE — H&P (Signed)
History and Physical    [name] WUJ:[id number] DOB: 04/26/1932 DOA: 07/21/2016  PCP: Oneal Grout, MD Patient coming from: [facility]  Chief Complaint: SOB  HPI: [name] is a 83 y.o. female with medical history significant of oxygen dependent chronic respiratory failure, C KD, initial fibrillation, chronic combined systolic and diastolic congestive heart failure, fibromyalgia, dementia, nonischemic cardiomyopathy, DVT/PE, symptomatic bradycardia status post pacemaker placement, CVA, OSA, hypothyroidism, hyperlipidemia, neuropathy, hypertension.  Patient presenting with gradual onset worsening shortness of breath. Associate with orthopnea and worsening lower extremity edema. Patient states that this feels like her typical congestive heart failure though has had some atypical features including elevated temperature to 99 and occasional phlegm production. Endorses wheezing. Patient shortness of breath became significantly worse overnight and EMS was called by nursing home staff. Initial O2 saturations noted to be around 60%. Patient placed on CPAP.  ED Course: Objective findings outlined below. Patient transitioned and BiPAP upon presentation to the ED. Short time later patient had single episode of emesis at which time BiPAP was discontinued. Patient maintaining O2 saturations on nasal cannula.  Review of Systems: As per HPI otherwise 10 point review of systems negative.   Ambulatory Status:limited due to chronic medical conditions.   Past Medical History:  Diagnosis Date  . Acute on chronic renal failure [facility])    sees Dr Allena Katz   . Anemia    Acute blood loss  . Aortic regurgitation   . Aortic stenosis 10/13/2012   Low EF, low gradient with severe aortic stenosis confirmed by dobutamine stress echocardiogram s/p TAVR 12/2012  . Asthma   . Atrial fibrillation (HCC)    tachy-brady syndrome with <1% recurrent PAF since pacemaker placement  . Cataracts, bilateral   .  Chronic combined systolic and diastolic CHF (congestive heart failure) (HCC)   . Chronic lower back pain   . CKD (chronic kidney disease)   . Coronary artery disease involving native coronary artery of native heart   . Dementia    Without behavioral disturbance  . Dysrhythmia   . Fibromyalgia   . Gastroesophageal reflux disease   . H/O dizziness   . H/O urinary frequency   . H/O: stroke   . Hard of hearing   . Headache    hx of migraines   . Heart murmur   . History of blood transfusion   . History of bronchitis   . History of kidney stones   . History of urinary tract infection   . HLD (hyperlipidemia)   . HTN (hypertension)   . Hypothyroidism   . Neuropathy (HCC)   . Nonischemic cardiomyopathy (HCC)   . On home oxygen therapy    patient uses at nite- 2L- has not used in > 6 months per patient  . Osteoarthritis   . Osteoporosis   . Pneumonia    hx of x 3   . PONV (postoperative nausea and vomiting)   . Presence of permanent cardiac pacemaker   . Pulmonary embolism (HCC)    HISTORY OF, the pt. had a recurrent bilateral pulmonary emboli in 2005, on warfarin therapy and at which time she under went implantation of IVC filter  . Repeated falls   . Rupture of right patellar tendon   . Sciatica   . Shortness of breath dyspnea    with exertion   . Sleep apnea    uses oxygen at night and PRN- not used since > 6 months / DOES NOT USE  C-PAP  .  Spinal stenosis   . Stroke (HCC)   . Symptomatic bradycardia   . Symptomatic bradycardia 2012   s/p Medtronic PPM  . Syncope   . Urinary incontinence   . Urinary urgency     Past Surgical History:  Procedure Laterality Date  . ABDOMINAL HYSTERECTOMY    . APPENDECTOMY    . BACK SURGERY    . BUNIONECTOMY    . CARDIAC CATHETERIZATION    . CATARACT EXTRACTION    . CENTRAL VENOUS CATHETER INSERTION Left 12/21/2012   Procedure: INSERTION CENTRAL LINE ADULT;  Surgeon: Tonny Bollman, MD;  Location: [facility] OR;  Service: Open Heart  Surgery;  Laterality: Left;  . ELBOW SURGERY     bilat   . INTRAOPERATIVE TRANSESOPHAGEAL ECHOCARDIOGRAM N/A 12/21/2012   Procedure: INTRAOPERATIVE TRANSESOPHAGEAL ECHOCARDIOGRAM;  Surgeon: Tonny Bollman, MD;  Location: [facility] OR;  Service: Open Heart Surgery;  Laterality: N/A;  . KNEE SURGERY Left   . LEFT AND RIGHT HEART CATHETERIZATION WITH CORONARY ANGIOGRAM N/A 10/18/2012   Procedure: LEFT AND RIGHT HEART CATHETERIZATION WITH CORONARY ANGIOGRAM;  Surgeon: Tonny Bollman, MD;  Location: [facility] CATH LAB;  Service: Cardiovascular;  Laterality: N/A;  . NASAL SEPTUM SURGERY    . NOSE SURGERY     X 2  . ORIF PATELLA Right 03/08/2015   Procedure:  OPEN REDUCTION INTERNAL FIXATION RIGHT  PATELLA TENDON AVULSION;  Surgeon: Durene Romans, MD;  Location: [facility] ORS;  Service: Orthopedics;  Laterality: Right;  . PACEMAKER INSERTION     Medtronic  . PATELLAR TENDON REPAIR Right 06/25/2015   Procedure: RIGHT PATELLA TENDON REVISION/REPAIR;  Surgeon: Durene Romans, MD;  Location: [facility] ORS;  Service: Orthopedics;  Laterality: Right;  . SHOULDER SURGERY     bilat   . THYROIDECTOMY, PARTIAL    . TONSILLECTOMY    . TOTAL KNEE ARTHROPLASTY Right 12/04/2014   Procedure: RIGHT TOTAL  KNEE ARTHROPLASTY;  Surgeon: Durene Romans, MD;  Location: [facility] ORS;  Service: Orthopedics;  Laterality: Right;  . TRANSCATHETER AORTIC VALVE REPLACEMENT, TRANSFEMORAL  12/21/2012   a. 29mm Edwards Sapien XT transcatheter heart valve placed via open left transfemoral approach b. Intra-op TEE: well-seated bioprosthetic aortic valve with mean gradient 2 mmHg, trivial AI, mild MR, EF 30-35%  . TRANSCATHETER AORTIC VALVE REPLACEMENT, TRANSFEMORAL N/A 12/21/2012   Procedure: TRANSCATHETER AORTIC VALVE REPLACEMENT, TRANSFEMORAL;  Surgeon: Tonny Bollman, MD;  Location: [facility] OR;  Service: Open Heart Surgery;  Laterality: N/A;    Social History   Social History  . Marital status: Divorced    Spouse name: N/A  . Number of children: N/A  . Years of  education: N/A   Occupational History  . Not on file.   Social History Main Topics  . Smoking status: Never Smoker  . Smokeless tobacco: Never Used  . Alcohol use No  . Drug use: No  . Sexual activity: Not Currently   Other Topics Concern  . Not on file   Social History Narrative  . No narrative on file    Allergies  Allergen Reactions  . Nubain [Nalbuphine Hcl] Hives    Went into cardiac arrest   . Codeine Hives and Nausea Only  . Darvocet [Propoxyphene N-Acetaminophen] Nausea Only    Family History  Problem Relation Age of Onset  . Ovarian cancer Mother     Deceased  . Epilepsy Father     Deceased    Prior to Admission medications   Medication Sig Start Date End Date Taking? Authorizing Provider  alendronate (FOSAMAX) 70  MG/75ML solution Take 70 mg by mouth every Saturday. Take on an empty stomach with a full glass of fluids, remain upright 30 minutes after administration - for bone health   Yes Historical Provider, MD  amiodarone (PACERONE) 100 MG tablet Take 100 mg by mouth daily.   Yes Historical Provider, MD  atorvastatin (LIPITOR) 10 MG tablet Take 10 mg by mouth daily at 6 PM.    Yes Historical Provider, MD  Biotin 5000 MCG CAPS Take 5,000 mcg by mouth daily at 6 PM.   Yes Historical Provider, MD  bisacodyl (DULCOLAX) 10 MG suppository Place 10 mg rectally daily as needed (constipation).    Yes Historical Provider, MD  calcitRIOL (ROCALTROL) 0.25 MCG capsule Take 0.25 mcg by mouth daily.    Yes Historical Provider, MD  carvedilol (COREG) 3.125 MG tablet Take 1 tablet (3.125 mg total) by mouth 2 (two) times daily with a meal. 07/17/16  Yes Penny Pia, MD  cholecalciferol (VITAMIN D) 1000 units tablet Take 2,000 Units by mouth at bedtime.   Yes Historical Provider, MD  cycloSPORINE (RESTASIS) 0.05 % ophthalmic emulsion Place 1 drop into both eyes 2 (two) times daily.    Yes Historical Provider, MD  donepezil (ARICEPT) 10 MG tablet Take 10 mg by mouth at bedtime.     Yes Historical Provider, MD  DULoxetine (CYMBALTA) 60 MG capsule Take 60 mg by mouth daily at 6 PM.    Yes Historical Provider, MD  esomeprazole (NEXIUM) 40 MG capsule Take 40 mg by mouth daily.    Yes Historical Provider, MD  febuxostat (ULORIC) 40 MG tablet Take 40 mg by mouth daily at 6 PM.   Yes Historical Provider, MD  ferrous sulfate 325 (65 FE) MG tablet Take 325 mg by mouth daily.    Yes Historical Provider, MD  hydrALAZINE (APRESOLINE) 10 MG tablet Take 1 tablet (10 mg total) by mouth 3 (three) times daily. 07/17/16  Yes Penny Pia, MD  ipratropium-albuterol (DUONEB) 0.5-2.5 (3) MG/3ML SOLN Take 3 mLs by nebulization every 6 (six) hours as needed (wheezing/shortness of breath).   Yes Historical Provider, MD  levothyroxine (SYNTHROID, LEVOTHROID) 112 MCG tablet Take 112 mcg by mouth daily.    Yes Historical Provider, MD  Linaclotide Karlene Einstein) 145 MCG CAPS capsule Take 145 mcg by mouth daily.    Yes Historical Provider, MD  memantine (NAMENDA) 5 MG tablet Take 5 mg by mouth 2 (two) times daily.    Yes Historical Provider, MD  Multiple Vitamin (MULTIVITAMIN WITH MINERALS) TABS tablet Take 1 tablet by mouth daily.   Yes Historical Provider, MD  nitroGLYCERIN (NITROSTAT) 0.4 MG SL tablet Place 0.4 mg under the tongue every 5 (five) minutes as needed for chest pain (MAX 3 TABLETS).    Yes Historical Provider, MD  pramoxine (SARNA SENSITIVE) 1 % LOTN Apply 1 application topically daily as needed (itching).    Yes Historical Provider, MD  pregabalin (LYRICA) 50 MG capsule Take 1 capsule (50 mg total) by mouth 2 (two) times daily as needed (pain). 07/17/16  Yes Penny Pia, MD  simethicone (MYLICON) 80 MG chewable tablet Chew 80 mg by mouth every 12 (twelve) hours as needed for flatulence.    Yes Historical Provider, MD  vitamin B-12 (CYANOCOBALAMIN) 1000 MCG tablet Take 1,000 mcg by mouth 2 (two) times a week. Tuesday and Friday   Yes Historical Provider, MD  warfarin (COUMADIN) 2 MG tablet Take 1  tablet (2 mg total) by mouth daily at 6 PM. Patient taking differently:  Take 1.5 mg by mouth daily.  07/17/16  Yes Penny Pia, MD    Physical Exam: Vitals:   07/21/16 0645 07/21/16 0655 07/21/16 0700 07/21/16 0836  BP: (!) 162/95 (!) 142/92 (!) 152/86 124/74  Pulse: 99 93 89 69  Resp: (!) 24  20 16   Temp:      SpO2: 96% 99% 100% 94%  Weight:      Height:         General: Appears to be mildly anxious, sitting up in bed Eyes:  PERRL, EOMI, normal lids, iris ENT: Edentulous, moist mucous membranes, normal hearing Neck:  no LAD, masses or thyromegaly Cardiovascular: Difficult to appreciate cardiac sounds, regular rate and rhythm, trace to 1+ lower extremity pitting edema bilaterally Respiratory: Speaks in short sentences. Coarse breath sounds throughout, increased effort, on nasal cannula, intermittent wheezing. Abdomen:  soft, ntnd, NABS Skin:  no rash or induration seen on limited exam Musculoskeletal:  grossly normal tone BUE/BLE, good ROM, no bony abnormality Psychiatric:  grossly normal mood and affect, speech fluent and appropriate, AOx3 Neurologic:  CN 2-12 grossly intact, moves all extremities in coordinated fashion, sensation intact  Labs on Admission: I have personally reviewed following labs and imaging studies  CBC:  Recent Labs Lab 07/14/16 1823 07/15/16 0626 07/16/16 0701 07/21/16 0627  WBC 5.3 5.1 4.7 13.3*  NEUTROABS 2.9  --   --  9.8*  HGB 10.8* 10.1* 9.6* 12.4  HCT 32.8* 30.5* 28.9* 39.3  MCV 95.3 93.6 94.4 95.6  PLT 227 183 200 305   Basic Metabolic Panel:  Recent Labs Lab 07/14/16 1823 07/15/16 0626 07/16/16 0701 07/21/16 0627  NA 142 142 142 144  K 4.5 4.0 4.1 3.5  CL 105 104 104 107  CO2 27 26 28 22   GLUCOSE 104* 79 85 271*  BUN 52* 52* 53* 24*  CREATININE 2.34* 2.28* 2.32* 1.78*  CALCIUM 9.3 9.2 8.9 9.8   GFR: Estimated Creatinine Clearance: 21.9 mL/min (A) (by C-G formula based on SCr of 1.78 mg/dL (H)). Liver Function  Tests:  Recent Labs Lab 07/14/16 1823  AST 25  ALT 14  ALKPHOS 63  BILITOT 0.6  PROT 6.1*  ALBUMIN 3.5   No results for input(s): LIPASE, AMYLASE in the last 168 hours. No results for input(s): AMMONIA in the last 168 hours. Coagulation Profile:  Recent Labs Lab 07/14/16 1823 07/15/16 0626 07/16/16 0701 07/17/16 0552  INR 4.03* 3.06 3.10 2.67   Cardiac Enzymes: No results for input(s): CKTOTAL, CKMB, CKMBINDEX, TROPONINI in the last 168 hours. BNP (last 3 results) No results for input(s): PROBNP in the last 8760 hours. HbA1C: No results for input(s): HGBA1C in the last 72 hours. CBG:  Recent Labs Lab 07/21/16 0643  GLUCAP 259*   Lipid Profile: No results for input(s): CHOL, HDL, LDLCALC, TRIG, CHOLHDL, LDLDIRECT in the last 72 hours. Thyroid Function Tests: No results for input(s): TSH, T4TOTAL, FREET4, T3FREE, THYROIDAB in the last 72 hours. Anemia Panel: No results for input(s): VITAMINB12, FOLATE, FERRITIN, TIBC, IRON, RETICCTPCT in the last 72 hours. Urine analysis:    Component Value Date/Time   COLORURINE YELLOW 07/15/2016 0914   APPEARANCEUR CLEAR 07/15/2016 0914   LABSPEC 1.009 07/15/2016 0914   PHURINE 5.0 07/15/2016 0914   GLUCOSEU NEGATIVE 07/15/2016 0914   HGBUR NEGATIVE 07/15/2016 0914   BILIRUBINUR NEGATIVE 07/15/2016 0914   KETONESUR NEGATIVE 07/15/2016 0914   PROTEINUR NEGATIVE 07/15/2016 0914   UROBILINOGEN 0.2 11/29/2014 1411   NITRITE NEGATIVE 07/15/2016 0914   LEUKOCYTESUR  NEGATIVE 07/15/2016 0914    Creatinine Clearance: Estimated Creatinine Clearance: 21.9 mL/min (A) (by C-G formula based on SCr of 1.78 mg/dL (H)).  Sepsis Labs: @LABRCNTIP (procalcitonin:4,lacticidven:4) ) Recent Results (from the past 240 hour(s))  MRSA PCR Screening     Status: None   Collection Time: 07/15/16 12:21 AM  Result Value Ref Range Status   MRSA by PCR NEGATIVE NEGATIVE Final    Comment:        The GeneXpert MRSA Assay (FDA approved for NASAL  specimens only), is one component of a comprehensive MRSA colonization surveillance program. It is not intended to diagnose MRSA infection nor to guide or monitor treatment for MRSA infections.      Radiological Exams on Admission: Dg Chest Portable 1 View  Result Date: 07/21/2016 CLINICAL DATA:  Shortness of breath, cardiomyopathy, atrial fibrillation EXAM: PORTABLE CHEST 1 VIEW COMPARISON:  Portable chest x-ray of July 14, 2016 FINDINGS: The lungs are adequately inflated. The interstitial markings are increased and more conspicuous than on the previous study. There is increased density at the left lung base with obscuration of the hemidiaphragm. The cardiac silhouette is mildly enlarged. The pulmonary vascularity is engorged. A prosthetic aortic valve cage remains visible. There is calcification in the wall of the aortic arch. The ICD is in stable position. IMPRESSION: Worsening of the pulmonary interstitium consistent with edema. There is bilateral pulmonary vascular congestion. Persistent left lower lobe atelectasis or pneumonia. Thoracic aortic atherosclerosis. Electronically Signed   By: Jakyrie Totherow  [place] M.D.   On: 07/21/2016 07:05    EKG: Independently reviewed. ectompic atrial tach, no ACS, LBBB, similar to previous read  Assessment/Plan Active Problems:   Paroxysmal atrial fibrillation (HCC)   Hypertension   S/P TAVR (transcatheter aortic valve replacement)   CKD (chronic kidney disease), stage III   Dementia without behavioral disturbance   Hypothyroidism   Acute on chronic combined systolic and diastolic congestive heart failure (HCC)   Acute on chronic respiratory failure with hypoxia (HCC)    Acute on chronic hypoxic respiratory failure: Baseline 3 L nasal cannula. O2 saturation initially 60% by EMS evaluation. Multifactorial etiology including CHF exacerbation with possible mucus plug or aspiration (h/o of the same) with possible viral versus bacterial infection. Initial  elevated lactic acid likely from hypoxic episode. ABG with pH of 7.31, PCO2 41, PO2 278 with bicarbonate 23. BNP 1636 with WBC 13.3 with mild left shift. Afebrile. Patient on BiPAP for very short period of time due to single episode of emesis and is tolerated nasal cannula for a well since that time. Vancomycin and cefepime initially ordered by ED for concern for HCAP. Last echo from 06/10/2016 showing EF of 35% and grade 2 diastolic dysfunction. Torsemide and Imdur were held at time of last discharge on 07/17/2016. - Lasix 60 IV 1, followed by resuming home torsemide 20 mg by mouth daily - Respiratory viral panel, Procalcitonin - consider ABX or Tamiflu pending results.  - repeat CXR in am - Strict I/O, daily weights - duonebs Q6 w/ albuterol Q2prn. Solumedrol if worsens - SLP eval  Afib/Brady/TAVR/CAD: pacemaker placed.  - continue Amio, coreg, coumadin - Pacemaker interrogation  CKD: Cr 1.7. At basleine - BMET in am  HTN: Previous admission pt had several changes to regimen due to hypotension contributing to fall. Now pt BP elevated. May need further changes and input from CHF team - consider informal consult. - continue coreg, hydralazine - Resume Imdur  Chronic pain/Neuropathy: also w/ depression.  - continue lyrica, cymbalta  Dementia: mild - continue namenda, aricept  Gout: - continue Uloric  HLD: - continue statin  Hypothyroid: - continue synthroid   DVT prophylaxis: coumadin  Code Status: DNI  Family Communication: none  Disposition Plan: pending full eval and improvement in respiratory status and diuresis  Consults called: none  Admission status: inpt - SDU    Danya Spearman J MD [facility]  If 7PM-7AM, please contact night-coverage [email or web address] Password [email or web address]  07/21/2016, 11:01 AM

## 2016-07-21 NOTE — ED Notes (Signed)
The pt is more relaxed  Breathing easier

## 2016-07-21 NOTE — ED Notes (Signed)
Checked patient blood sugar it was 166 notified RN Steward Drone of blood sugar

## 2016-07-21 NOTE — Progress Notes (Signed)
Pharmacy Antibiotic Note  [name] is a 83 y.o. female admitted on 07/21/2016 with HCAP. Pharmacy originally dose cefepime and vancomycin (patient did not receive any doses) now to switch to levaquin. SCr 1.78 and CrCl 22 ml/min.   Plan: 1) Levaquin 500mg  IV q48 2) Follow renal function, cultures, LOT  Height: 4\' 10"  (147.3 cm) Weight: 179 lb (81.2 kg) IBW/kg (Calculated) : 40.9  Temp (24hrs), Avg:98.1 F (36.7 C), Min:98.1 F (36.7 C), Max:98.1 F (36.7 C)   Recent Labs Lab 07/14/16 1823 07/15/16 0626 07/16/16 0701 07/21/16 0627 07/21/16 0805  WBC 5.3 5.1 4.7 13.3*  --   CREATININE 2.34* 2.28* 2.32* 1.78*  --   LATICACIDVEN  --   --   --   --  5.12*    Estimated Creatinine Clearance: 21.9 mL/min (A) (by C-G formula based on SCr of 1.78 mg/dL (H)).    Allergies  Allergen Reactions  . Nubain [Nalbuphine Hcl] Hives    Went into cardiac arrest   . Codeine Hives and Nausea Only  . Darvocet [Propoxyphene N-Acetaminophen] Nausea Only    Antimicrobials this admission: 4/2 Levaquin >>  Dose adjustments this admission: n/a  Microbiology results: 4/2 blood x2>>  Thank you for allowing pharmacy to be a part of this patient's care.  Fredrik Rigger 07/21/2016 5:36 PM

## 2016-07-21 NOTE — ED Notes (Signed)
c/o chest pain also

## 2016-07-21 NOTE — ED Notes (Signed)
Assisted pt to bedpan

## 2016-07-21 NOTE — ED Notes (Signed)
Pt had large green liquid stool approx 300cc -- dr Margot Ables notified.

## 2016-07-21 NOTE — ED Notes (Signed)
Attempted report 

## 2016-07-21 NOTE — ED Provider Notes (Signed)
Emergency Department Provider Note   I have reviewed the triage vital signs and the nursing notes.  Level 5 caveat: Acute respiratory distress.   HISTORY  Chief Complaint Shortness of Breath   HPI [name] is a 83 y.o. female with PMH of CKD, asthma, a-fib, HTN, HLD, AS, and OA presents to the emergency department for evaluation of acute respiratory distress. Patient reports that she suddenly became very short of breath and had some associated chest tightness. Denies fever or chills. Denies productive cough.  Level 5 caveat: acute respiratory distress.   Past Medical History:  Diagnosis Date  . Acute on chronic renal failure [facility])    sees Dr Allena Katz   . Anemia    Acute blood loss  . Aortic regurgitation   . Aortic stenosis 10/13/2012   Low EF, low gradient with severe aortic stenosis confirmed by dobutamine stress echocardiogram s/p TAVR 12/2012  . Asthma   . Atrial fibrillation (HCC)    tachy-brady syndrome with <1% recurrent PAF since pacemaker placement  . Cataracts, bilateral   . Chronic combined systolic and diastolic CHF (congestive heart failure) (HCC)   . Chronic lower back pain   . CKD (chronic kidney disease)   . Coronary artery disease involving native coronary artery of native heart   . Dementia    Without behavioral disturbance  . Dysrhythmia   . Fibromyalgia   . Gastroesophageal reflux disease   . H/O dizziness   . H/O urinary frequency   . H/O: stroke   . Hard of hearing   . Headache    hx of migraines   . Heart murmur   . History of blood transfusion   . History of bronchitis   . History of kidney stones   . History of urinary tract infection   . HLD (hyperlipidemia)   . HTN (hypertension)   . Hypothyroidism   . Neuropathy (HCC)   . Nonischemic cardiomyopathy (HCC)   . On home oxygen therapy    patient uses at nite- 2L- has not used in > 6 months per patient  . Osteoarthritis   . Osteoporosis   . Pneumonia    hx of x 3   . PONV  (postoperative nausea and vomiting)   . Presence of permanent cardiac pacemaker   . Pulmonary embolism (HCC)    HISTORY OF, the pt. had a recurrent bilateral pulmonary emboli in 2005, on warfarin therapy and at which time she under went implantation of IVC filter  . Repeated falls   . Rupture of right patellar tendon   . Sciatica   . Shortness of breath dyspnea    with exertion   . Sleep apnea    uses oxygen at night and PRN- not used since > 6 months / DOES NOT USE  C-PAP  . Spinal stenosis   . Stroke (HCC)   . Symptomatic bradycardia   . Symptomatic bradycardia 2012   s/p Medtronic PPM  . Syncope   . Urinary incontinence   . Urinary urgency     Patient Active Problem List   Diagnosis Date Noted  . Respiratory failure (HCC) 07/21/2016  . Acute on chronic respiratory failure with hypoxia (HCC) 07/21/2016  . Head contusion 07/15/2016  . Fall 07/14/2016  . Chest pain at rest   . Acute on chronic combined systolic and diastolic congestive heart failure (HCC)   . Unstable angina (HCC)   . Acute respiratory failure (HCC) 06/13/2016  . Elevated blood sugar 06/13/2016  .  Dementia without behavioral disturbance 01/09/2016  . Chronic constipation 01/09/2016  . Neuropathic pain 01/09/2016  . Esophageal reflux 01/09/2016  . Hypothyroidism 01/09/2016  . CKD (chronic kidney disease) stage 4, GFR 15-29 ml/min (HCC) 01/09/2016  . Right patellar tendon rupture 06/25/2015  . Avulsion of right patellar tendon 03/08/2015  . Aspiration pneumonia (HCC) 12/14/2014  . Anemia of chronic disease 12/14/2014  . Aspiration into airway 12/14/2014  . Swallowing dysfunction 12/14/2014  . S/P right TKA 12/04/2014  . S/P knee replacement 12/04/2014  . Acute blood loss anemia 12/28/2012  . Thrombocytopenia (HCC) 12/28/2012  . Toe fracture 12/28/2012  . CKD (chronic kidney disease), stage III 12/28/2012  . Low back pain 12/28/2012  . Wound dehiscence, surgical 12/28/2012  . Atelectasis 12/28/2012    . S/P TAVR (transcatheter aortic valve replacement) 12/21/2012  . Severe aortic stenosis 12/06/2012  . Aortic stenosis 10/13/2012  . Acute on chronic systolic and diastolic heart failure, NYHA class 3 (HCC) 10/13/2012  . Pacemaker 10/15/2010  . Paroxysmal atrial fibrillation (HCC) 10/15/2010  . Dyslipidemia 10/15/2010  . Hypertension 10/15/2010  . Chronic diastolic heart failure (HCC) 10/15/2010    Past Surgical History:  Procedure Laterality Date  . ABDOMINAL HYSTERECTOMY    . APPENDECTOMY    . BACK SURGERY    . BUNIONECTOMY    . CARDIAC CATHETERIZATION    . CATARACT EXTRACTION    . CENTRAL VENOUS CATHETER INSERTION Left 12/21/2012   Procedure: INSERTION CENTRAL LINE ADULT;  Surgeon: Tonny Bollman, MD;  Location: [facility] OR;  Service: Open Heart Surgery;  Laterality: Left;  . ELBOW SURGERY     bilat   . INTRAOPERATIVE TRANSESOPHAGEAL ECHOCARDIOGRAM N/A 12/21/2012   Procedure: INTRAOPERATIVE TRANSESOPHAGEAL ECHOCARDIOGRAM;  Surgeon: Tonny Bollman, MD;  Location: [facility] OR;  Service: Open Heart Surgery;  Laterality: N/A;  . KNEE SURGERY Left   . LEFT AND RIGHT HEART CATHETERIZATION WITH CORONARY ANGIOGRAM N/A 10/18/2012   Procedure: LEFT AND RIGHT HEART CATHETERIZATION WITH CORONARY ANGIOGRAM;  Surgeon: Tonny Bollman, MD;  Location: [facility] CATH LAB;  Service: Cardiovascular;  Laterality: N/A;  . NASAL SEPTUM SURGERY    . NOSE SURGERY     X 2  . ORIF PATELLA Right 03/08/2015   Procedure:  OPEN REDUCTION INTERNAL FIXATION RIGHT  PATELLA TENDON AVULSION;  Surgeon: Durene Romans, MD;  Location: [facility] ORS;  Service: Orthopedics;  Laterality: Right;  . PACEMAKER INSERTION     Medtronic  . PATELLAR TENDON REPAIR Right 06/25/2015   Procedure: RIGHT PATELLA TENDON REVISION/REPAIR;  Surgeon: Durene Romans, MD;  Location: [facility] ORS;  Service: Orthopedics;  Laterality: Right;  . SHOULDER SURGERY     bilat   . THYROIDECTOMY, PARTIAL    . TONSILLECTOMY    . TOTAL KNEE ARTHROPLASTY Right 12/04/2014   Procedure:  RIGHT TOTAL  KNEE ARTHROPLASTY;  Surgeon: Durene Romans, MD;  Location: [facility] ORS;  Service: Orthopedics;  Laterality: Right;  . TRANSCATHETER AORTIC VALVE REPLACEMENT, TRANSFEMORAL  12/21/2012   a. 70mm Edwards Sapien XT transcatheter heart valve placed via open left transfemoral approach b. Intra-op TEE: well-seated bioprosthetic aortic valve with mean gradient 2 mmHg, trivial AI, mild MR, EF 30-35%  . TRANSCATHETER AORTIC VALVE REPLACEMENT, TRANSFEMORAL N/A 12/21/2012   Procedure: TRANSCATHETER AORTIC VALVE REPLACEMENT, TRANSFEMORAL;  Surgeon: Tonny Bollman, MD;  Location: [facility] OR;  Service: Open Heart Surgery;  Laterality: N/A;    Current Outpatient Rx  . Order #: [id number] Class: Historical Med  . Order #: [id number] Class: Historical Med  . Order #: [id number] Class: Historical Med  .  Order #: [id number] Class: Historical Med  . Order #: [id number] Class: Historical Med  . Order #: [id number] Class: Historical Med  . Order #: [id number] Class: No Print  . Order #: [id number] Class: Historical Med  . Order #: [id number] Class: Historical Med  . Order #: [id number] Class: Historical Med  . Order #: [id number] Class: Historical Med  . Order #: [id number] Class: Historical Med  . Order #: [id number] Class: Historical Med  . Order #: [id number] Class: Historical Med  . Order #: [id number] Class: No Print  . Order #: [id number] Class: Historical Med  . Order #: [id number] Class: Historical Med  . Order #: [id number] Class: Historical Med  . Order #: [id number] Class: Historical Med  . Order #: [id number] Class: Historical Med  . Order #: [id number] Class: Historical Med  . Order #: [id number] Class: Historical Med  . Order #: [id number] Class: No Print  . Order #: [id number] Class: Historical Med  . Order #: [id number] Class: Historical Med  . Order #: [id number] Class: Normal    Allergies Nubain [nalbuphine hcl]; Codeine; and Darvocet [propoxyphene n-acetaminophen]  Family History  Problem Relation Age of Onset  . Ovarian cancer Mother       Deceased  . Epilepsy Father     Deceased    Social History Social History  Substance Use Topics  . Smoking status: Never Smoker  . Smokeless tobacco: Never Used  . Alcohol use No    Review of Systems  Level 5 caveat: Acute respiratory distress.   ____________________________________________   PHYSICAL EXAM:  VITAL SIGNS: Temp: 98.1 F Resp: 28 SpO2: 94% CPAP Pulse: 108 BP: 156/102   Constitutional: Drowsy with acute respiratory distress. Eyes: Conjunctivae are normal.  Head: Atraumatic. Nose: No congestion/rhinnorhea. Mouth/Throat: Mucous membranes are dry. Neck: No stridor.  Cardiovascular: Tachycardia. Good peripheral circulation. Grossly normal heart sounds.   Respiratory: Significantly increased respiratory effort.  No retractions. Lungs with diffuse rales and diminished sounds that the bases.  Gastrointestinal: Soft and nontender. No distention.  Musculoskeletal: No lower extremity tenderness nor edema. No gross deformities of extremities. Neurologic:  Normal speech and language. No gross focal neurologic deficits are appreciated.  Skin:  Skin is warm, dry and intact. No rash noted.  ____________________________________________   LABS (all labs ordered are listed, but only abnormal results are displayed)  Labs Reviewed  BASIC METABOLIC PANEL - Abnormal; Notable for the following:       Result Value   Glucose, Bld 271 (*)    BUN 24 (*)    Creatinine, Ser 1.78 (*)    GFR calc non Af Amer 25 (*)    GFR calc Af Amer 29 (*)    All other components within normal limits  BRAIN NATRIURETIC PEPTIDE - Abnormal; Notable for the following:    B Natriuretic Peptide 1,636.9 (*)    All other components within normal limits  CBC WITH DIFFERENTIAL/PLATELET - Abnormal; Notable for the following:    WBC 13.3 (*)    RDW 15.9 (*)    Neutro Abs 9.8 (*)    All other components within normal limits  PROTIME-INR - Abnormal; Notable for the following:    Prothrombin  Time 20.4 (*)    All other components within normal limits  I-STAT CG4 LACTIC ACID, ED - Abnormal; Notable for the following:    Lactic Acid, Venous 5.12 (*)    All other components within normal limits  I-STAT ARTERIAL BLOOD GAS, ED - Abnormal; Notable for the following:    pH, Arterial 7.316 (*)    pO2, Arterial 78.0 (*)    Acid-base deficit 5.0 (*)  All other components within normal limits  CBG MONITORING, ED - Abnormal; Notable for the following:    Glucose-Capillary 259 (*)    All other components within normal limits  CULTURE, BLOOD (ROUTINE X 2)  CULTURE, BLOOD (ROUTINE X 2)  RESPIRATORY PANEL BY PCR  PROCALCITONIN  LACTIC ACID, PLASMA  I-STAT TROPOININ, ED   ____________________________________________  EKG   EKG Interpretation  Date/Time:  Monday July 21 2016 06:23:55 EDT Ventricular Rate:  125 PR Interval:    QRS Duration: 153 QT Interval:  395 QTC Calculation: 570 R Axis:   -60 Text Interpretation:  Ectopic atrial tachycardia, unifocal Probable left atrial enlargement Left bundle branch block Baseline wander in lead(s) V2 V3 V4 V5 V6 Partial missing lead(s): V2 V3 V4 V5 V6 No STEMI. Similar to prior but with tachycardia Confirmed by LONG MD, JOSHUA [phone]) on 07/21/2016 6:28:43 AM Also confirmed by LONG MD, JOSHUA [phone]), editor [place]  CCT, BEVERLY ([id number])  on 07/21/2016 6:53:16 AM       ____________________________________________  RADIOLOGY  Dg Chest Portable 1 View  Result Date: 07/21/2016 CLINICAL DATA:  Shortness of breath, cardiomyopathy, atrial fibrillation EXAM: PORTABLE CHEST 1 VIEW COMPARISON:  Portable chest x-ray of July 14, 2016 FINDINGS: The lungs are adequately inflated. The interstitial markings are increased and more conspicuous than on the previous study. There is increased density at the left lung base with obscuration of the hemidiaphragm. The cardiac silhouette is mildly enlarged. The pulmonary vascularity is engorged. A prosthetic  aortic valve cage remains visible. There is calcification in the wall of the aortic arch. The ICD is in stable position. IMPRESSION: Worsening of the pulmonary interstitium consistent with edema. There is bilateral pulmonary vascular congestion. Persistent left lower lobe atelectasis or pneumonia. Thoracic aortic atherosclerosis. Electronically Signed   By: David  [place] M.D.   On: 07/21/2016 07:05    ____________________________________________   PROCEDURES  Procedure(s) performed:   Procedures  CRITICAL CARE Performed by: Maia Plan Total critical care time: 50 minutes Critical care time was exclusive of separately billable procedures and treating other patients. Critical care was necessary to treat or prevent imminent or life-threatening deterioration. Critical care was time spent personally by me on the following activities: development of treatment plan with patient and/or surrogate as well as nursing, discussions with consultants, evaluation of patient's response to treatment, examination of patient, obtaining history from patient or surrogate, ordering and performing treatments and interventions, ordering and review of laboratory studies, ordering and review of radiographic studies, pulse oximetry and re-evaluation of patient's condition.  Alona Bene, MD Emergency Medicine  ____________________________________________   INITIAL IMPRESSION / ASSESSMENT AND PLAN / ED COURSE  Pertinent labs & imaging results that were available during my care of the patient were reviewed by me and considered in my medical decision making (see chart for details).  Patient resents to the emergency department for evaluation and acute respiratory distress. She was hypoxic on scene to the 60s with wet rales throughout. Given 4 sublingual nitroglycerin in route along with 2.5 of albuterol. On exam the patient is in acute distress which limits my history and ROS. She is complaining of some chest  discomfort. Lung exam is significant for diffuse rales. Hypoxic to the 70s when transitioning from CPAP to our BiPAP. Plan for ABG, CXR, nitro paste, and labs.   06:45 AM Patient is looking much better. BP down-trending with nitro ointment. [place] IV established by nursing. Patient speaking in short sentences. Mental status normal.   07:30 AM  Lab reports that they did not receive I-stat troponin or lactate. Lab will immediately go to draw.   08:00 AM Lactic acid elevated likely from acute pulmonary edema. Lactic acidosis from pathology OTHER THAN SEPSIS. Will initiate HCAP coverage with atelectasis in the LLL but clinically flash pulmonary edema is the cause of patient's presentation and lactate elevation.   Discussed patient's case with hospitlaist Dr. Konrad Dolores. Patient and family (if present) updated with plan. Care transferred to hospitlaist service.  I reviewed all nursing notes, vitals, pertinent old records, EKGs, labs, imaging (as available).  ____________________________________________  FINAL CLINICAL IMPRESSION(S) / ED DIAGNOSES  Final diagnoses:  Acute pulmonary edema (HCC)  Hypoxia  Precordial chest pain  Respiratory failure (HCC)     MEDICATIONS GIVEN DURING THIS VISIT:  Medications  ceFEPIme (MAXIPIME) 2 g in dextrose 5 % 50 mL IVPB (not administered)  ondansetron (ZOFRAN) injection 4 mg (not administered)  amiodarone (PACERONE) tablet 100 mg (not administered)  carvedilol (COREG) tablet 3.125 mg (3.125 mg Oral Given 07/21/16 1334)  febuxostat (ULORIC) tablet 40 mg (not administered)  hydrALAZINE (APRESOLINE) tablet 10 mg (10 mg Oral Given 07/21/16 1334)  pregabalin (LYRICA) capsule 50 mg (not administered)  pramoxine (SARNA SENSITIVE) 1 % lotion 1 application (not administered)  memantine (NAMENDA) tablet 5 mg (5 mg Oral Given 07/21/16 1334)  pantoprazole (PROTONIX) EC tablet 80 mg (80 mg Oral Given 07/21/16 1334)  atorvastatin (LIPITOR) tablet 10 mg (not administered)    levothyroxine (SYNTHROID, LEVOTHROID) tablet 112 mcg (not administered)  ferrous sulfate tablet 325 mg (not administered)  cycloSPORINE (RESTASIS) 0.05 % ophthalmic emulsion 1 drop (not administered)  linaclotide (LINZESS) capsule 145 mcg (not administered)  donepezil (ARICEPT) tablet 10 mg (not administered)  DULoxetine (CYMBALTA) DR capsule 60 mg (not administered)  torsemide (DEMADEX) tablet 20 mg (not administered)  0.9 %  sodium chloride infusion (not administered)  acetaminophen (TYLENOL) tablet 650 mg (not administered)    Or  acetaminophen (TYLENOL) suppository 650 mg (not administered)  ondansetron (ZOFRAN) tablet 4 mg (not administered)    Or  ondansetron (ZOFRAN) injection 4 mg (not administered)  ipratropium-albuterol (DUONEB) 0.5-2.5 (3) MG/3ML nebulizer solution 3 mL (not administered)  albuterol (PROVENTIL) (2.5 MG/3ML) 0.083% nebulizer solution 2.5 mg (not administered)  Warfarin - Pharmacist Dosing Inpatient (not administered)  warfarin (COUMADIN) tablet 3 mg (not administered)  nitroGLYCERIN (NITROGLYN) 2 % ointment 1 inch (1 inch Topical Given 07/21/16 0633)  ondansetron (ZOFRAN) 4 MG/2ML injection (4 mg  Given 07/21/16 0754)  furosemide (LASIX) injection 60 mg (60 mg Intravenous Given 07/21/16 1335)     NEW OUTPATIENT MEDICATIONS STARTED DURING THIS VISIT:  None   Note:  This document was prepared using Dragon voice recognition software and may include unintentional dictation errors.  Alona Bene, MD Emergency Medicine   Maia Plan, MD 07/21/16 [phone]

## 2016-07-21 NOTE — ED Notes (Signed)
rn david placed a [place] iv .  Iv team not needed

## 2016-07-21 NOTE — Telephone Encounter (Signed)
This is a patient of [facility], who was admitted to [facility] after hospitalization. [facility] Hospital F/U is needed. Hospital discharge from [facility] on 07/17/2016.

## 2016-07-21 NOTE — ED Triage Notes (Signed)
The pt is c/o shortness of breath at  [name] tonight  wjhen ems arrived her sats were in the 60s  She arrived on  c-pap  Terrible sounding cough skin cold and damp  Alert oriented  Placed on bi-pap on arrival to the ed by resp therapy

## 2016-07-21 NOTE — Progress Notes (Signed)
Pharmacy Antibiotic Note  [name] is a 83 y.o. female admitted on 07/21/2016 with pneumonia.  Pharmacy has been consulted for vancomycin/cefepime dosing. Afebrile. SCr 1.78 on admit (2.32 ~5days ago), CrCl~31.  Plan: Vancomycin 1750mg  IV x 1; then 1250mg  IV q24h Cefepime 2g IV x 1; then 1g IV q24h Monitor clinical progress, c/s, renal function F/u de-escalation plan/LOT, vancomycin trough as indicated   Height: 4\' 10"  (147.3 cm) Weight: 179 lb (81.2 kg) IBW/kg (Calculated) : 40.9  Temp (24hrs), Avg:98.1 F (36.7 C), Min:98.1 F (36.7 C), Max:98.1 F (36.7 C)   Recent Labs Lab 07/14/16 1823 07/15/16 0626 07/16/16 0701  WBC 5.3 5.1 4.7  CREATININE 2.34* 2.28* 2.32*    Estimated Creatinine Clearance: 16.8 mL/min (A) (by C-G formula based on SCr of 2.32 mg/dL (H)).    Allergies  Allergen Reactions  . Nubain [Nalbuphine Hcl] Hives    Went into cardiac arrest   . Codeine Hives and Nausea Only  . Darvocet [Propoxyphene N-Acetaminophen] Nausea Only    Babs Bertin, PharmD, BCPS Clinical Pharmacist 07/21/2016 7:28 AM

## 2016-07-21 NOTE — Progress Notes (Signed)
Elevated procalcitonin noted. Viral panel pending. Will start on Levaquin at this time. May need to add Tamiflu if viral panel + for flu  Shelly Flatten, MD [facility] 07/21/2016, 5:25 PM

## 2016-07-21 NOTE — ED Notes (Signed)
Pt vomiting small amount green liquid. BiPap removed-- placed on O2 at 3l/m/Baker. O2sats=95-98%.

## 2016-07-21 NOTE — Progress Notes (Signed)
PHARMACIST - PHYSICIAN COMMUNICATION  CONCERNING: P&T Medication Policy Regarding Oral Bisphosphonates  RECOMMENDATION: Your order for alendronate (Fosamax), ibandronate (Boniva), or risedronate (Actonel) has been discontinued at this time.  If the patient's post-hospital medical condition warrants safe use of this class of drugs, please resume the pre-hospital regimen upon discharge.  DESCRIPTION:  Alendronate (Fosamax), ibandronate (Boniva), and risedronate (Actonel) can cause severe esophageal erosions in patients who are unable to remain upright at least 30 minutes after taking this medication.   Since brief interruptions in therapy are thought to have minimal impact on bone mineral density, the Pharmacy & Therapeutics Committee has established that bisphosphonate orders should be routinely discontinued during hospitalization.   To override this safety policy and permit administration of Boniva, Fosamax, or Actonel in the hospital, prescribers must write "DO NOT HOLD" in the comments section when placing the order for this class of medications.   Babs Bertin, PharmD, BCPS Clinical Pharmacist 07/21/2016 12:12 PM

## 2016-07-21 NOTE — Progress Notes (Signed)
ANTICOAGULATION CONSULT NOTE  Pharmacy Consult for warfarin Indication: atrial fibrillation   Assessment: 53 yof with hx of afib on warfarin PTA. Also with hx DVT/PE noted. Pharmacy consulted to dose while inpatient. INR slightly subtherapeutic at 1.72. CBC wnl. No bleed documented.  PTA warfarin dose:  1.5mg  daily (last dose 07/20/16 PTA)  Goal of Therapy:  INR 2-3 Monitor platelets by anticoagulation protocol: Yes   Plan:  Warfarin 3mg  PO x 1 dose Daily INR Monitor CBC, s/sx bleeding  Babs Bertin, PharmD, BCPS Clinical Pharmacist 07/21/2016 11:00 AM

## 2016-07-21 NOTE — ED Notes (Signed)
Pt resting quietly at this time. Family at bedside.

## 2016-07-21 NOTE — ED Notes (Signed)
IV attempted x 1-- without success-- pt difficult stick will consult IV team.

## 2016-07-22 ENCOUNTER — Inpatient Hospital Stay (HOSPITAL_COMMUNITY): Payer: Medicare Other

## 2016-07-22 DIAGNOSIS — R0902 Hypoxemia: Secondary | ICD-10-CM

## 2016-07-22 DIAGNOSIS — J9601 Acute respiratory failure with hypoxia: Secondary | ICD-10-CM

## 2016-07-22 DIAGNOSIS — J810 Acute pulmonary edema: Secondary | ICD-10-CM

## 2016-07-22 DIAGNOSIS — J189 Pneumonia, unspecified organism: Secondary | ICD-10-CM

## 2016-07-22 DIAGNOSIS — J9602 Acute respiratory failure with hypercapnia: Secondary | ICD-10-CM

## 2016-07-22 LAB — BASIC METABOLIC PANEL
Anion gap: 10 (ref 5–15)
BUN: 27 mg/dL — AB (ref 6–20)
CALCIUM: 9 mg/dL (ref 8.9–10.3)
CO2: 29 mmol/L (ref 22–32)
Chloride: 104 mmol/L (ref 101–111)
Creatinine, Ser: 1.82 mg/dL — ABNORMAL HIGH (ref 0.44–1.00)
GFR calc Af Amer: 29 mL/min — ABNORMAL LOW (ref >60)
GFR calc non Af Amer: 25 mL/min — ABNORMAL LOW (ref >60)
Glucose, Bld: 87 mg/dL (ref 65–99)
Potassium: 4 mmol/L (ref 3.5–5.1)
Sodium: 143 mmol/L (ref 135–145)

## 2016-07-22 LAB — CBC
HCT: 28.8 % — ABNORMAL LOW (ref 36.0–46.0)
Hemoglobin: 9.4 g/dL — ABNORMAL LOW (ref 12.0–15.0)
MCH: 30.4 pg (ref 26.0–34.0)
MCHC: 31.9 g/dL (ref 30.0–36.0)
MCV: 95 fL (ref 78.0–100.0)
PLATELETS: 175 10*3/uL (ref 150–400)
RBC: 3.03 MIL/uL — ABNORMAL LOW (ref 3.87–5.11)
RDW: 15.7 % — AB (ref 11.5–15.5)
WBC: 12.2 10*3/uL — ABNORMAL HIGH (ref 4.0–10.5)

## 2016-07-22 LAB — PROTIME-INR
INR: 1.74
Prothrombin Time: 20.5 seconds — ABNORMAL HIGH (ref 11.4–15.2)

## 2016-07-22 MED ORDER — IPRATROPIUM-ALBUTEROL 0.5-2.5 (3) MG/3ML IN SOLN
3.0000 mL | Freq: Four times a day (QID) | RESPIRATORY_TRACT | Status: DC
Start: 2016-07-22 — End: 2016-07-23
  Administered 2016-07-22 – 2016-07-23 (×4): 3 mL via RESPIRATORY_TRACT
  Filled 2016-07-22 (×4): qty 3

## 2016-07-22 MED ORDER — FUROSEMIDE 10 MG/ML IJ SOLN
40.0000 mg | Freq: Once | INTRAMUSCULAR | Status: AC
  Administered 2016-07-22: 40 mg via INTRAVENOUS
  Filled 2016-07-22: qty 4

## 2016-07-22 MED ORDER — WARFARIN SODIUM 2 MG PO TABS
3.0000 mg | ORAL_TABLET | Freq: Once | ORAL | Status: AC
  Administered 2016-07-22: 3 mg via ORAL
  Filled 2016-07-22: qty 1.5

## 2016-07-22 MED ORDER — OXYCODONE HCL 5 MG PO TABS
5.0000 mg | ORAL_TABLET | Freq: Four times a day (QID) | ORAL | Status: AC | PRN
  Administered 2016-07-22 – 2016-07-23 (×2): 5 mg via ORAL
  Filled 2016-07-22 (×2): qty 1

## 2016-07-22 NOTE — Progress Notes (Signed)
ANTICOAGULATION CONSULT NOTE  Pharmacy Consult for warfarin Indication: atrial fibrillation  Assessment: 71 yof with hx of afib on warfarin PTA. Also with hx DVT/PE noted. Pharmacy consulted to dose while inpatient.  INR still subtherapeutic at 1.7 this morning. CBC wnl. No bleeding documented.  PTA warfarin dose:  1.5mg  daily (last dose 07/20/16 PTA)  Goal of Therapy:  INR 2-3 Monitor platelets by anticoagulation protocol: Yes   Plan:  Warfarin 3mg  again today Daily INR Monitor CBC, s/sx bleeding  Sheppard Coil PharmD., BCPS Clinical Pharmacist Pager [phone] 07/22/2016 11:15 AM

## 2016-07-22 NOTE — Care Management Note (Signed)
Case Management Note  Patient Details  Name: [name] MRN: [id number] Date of Birth: [birth year]  Subjective/Objective:    Pt admitted with SOB                Action/Plan:  PTA from [facility] - CSW consulted   Expected Discharge Date:                  Expected Discharge Plan:  Skilled Nursing Facility  In-House Referral:  Clinical Social Work  Discharge planning Services  CM Consult  Post Acute Care Choice:    Choice offered to:     DME Arranged:    DME Agency:     HH Arranged:    HH Agency:     Status of Service:     If discussed at [employer], dates discussed:    Additional Comments:  Cherylann Parr, RN 07/22/2016, 9:27 AM

## 2016-07-22 NOTE — Evaluation (Addendum)
Clinical/Bedside Swallow Evaluation Patient Details  Name: [name] MRN: [id number] Date of Birth: [birth year]  Today's Date: 07/22/2016 Time: SLP Start Time (ACUTE ONLY): 0850 SLP Stop Time (ACUTE ONLY): 0913 SLP Time Calculation (min) (ACUTE ONLY): 23 min  Past Medical History:  Past Medical History:  Diagnosis Date  . Acute on chronic renal failure [facility])    sees Dr Allena Katz   . Anemia    Acute blood loss  . Aortic regurgitation   . Aortic stenosis 10/13/2012   Low EF, low gradient with severe aortic stenosis confirmed by dobutamine stress echocardiogram s/p TAVR 12/2012  . Asthma   . Atrial fibrillation (HCC)    tachy-brady syndrome with <1% recurrent PAF since pacemaker placement  . Cataracts, bilateral   . Chronic combined systolic and diastolic CHF (congestive heart failure) (HCC)   . Chronic lower back pain   . CKD (chronic kidney disease)   . Coronary artery disease involving native coronary artery of native heart   . Dementia    Without behavioral disturbance  . Dysrhythmia   . Fibromyalgia   . Gastroesophageal reflux disease   . H/O dizziness   . H/O urinary frequency   . H/O: stroke   . Hard of hearing   . Headache    hx of migraines   . Heart murmur   . History of blood transfusion   . History of bronchitis   . History of kidney stones   . History of urinary tract infection   . HLD (hyperlipidemia)   . HTN (hypertension)   . Hypothyroidism   . Neuropathy (HCC)   . Nonischemic cardiomyopathy (HCC)   . On home oxygen therapy    patient uses at nite- 2L- has not used in > 6 months per patient  . Osteoarthritis   . Osteoporosis   . Pneumonia    hx of x 3   . PONV (postoperative nausea and vomiting)   . Presence of permanent cardiac pacemaker   . Pulmonary embolism (HCC)    HISTORY OF, the pt. had a recurrent bilateral pulmonary emboli in 2005, on warfarin therapy and at which time she under went implantation of IVC filter  . Repeated falls   . Rupture  of right patellar tendon   . Sciatica   . Shortness of breath dyspnea    with exertion   . Sleep apnea    uses oxygen at night and PRN- not used since > 6 months / DOES NOT USE  C-PAP  . Spinal stenosis   . Stroke (HCC)   . Symptomatic bradycardia   . Symptomatic bradycardia 2012   s/p Medtronic PPM  . Syncope   . Urinary incontinence   . Urinary urgency    Past Surgical History:  Past Surgical History:  Procedure Laterality Date  . ABDOMINAL HYSTERECTOMY    . APPENDECTOMY    . BACK SURGERY    . BUNIONECTOMY    . CARDIAC CATHETERIZATION    . CATARACT EXTRACTION    . CENTRAL VENOUS CATHETER INSERTION Left 12/21/2012   Procedure: INSERTION CENTRAL LINE ADULT;  Surgeon: Tonny Bollman, MD;  Location[facility] OR;  Service: Open Heart Surgery;  Laterality: Left;  . ELBOW SURGERY     bilat   . INTRAOPERATIVE TRANSESOPHAGEAL ECHOCARDIOGRAM N/A 12/21/2012   Procedure: INTRAOPERATIVE TRANSESOPHAGEAL ECHOCARDIOGRAM;  Surgeon: Tonny Bollman, MD;  Location: [facility] OR;  Service: Open Heart Surgery;  Laterality: N/A;  . KNEE SURGERY Left   . LEFT AND RIGHT HEART  CATHETERIZATION WITH CORONARY ANGIOGRAM N/A 10/18/2012   Procedure: LEFT AND RIGHT HEART CATHETERIZATION WITH CORONARY ANGIOGRAM;  Surgeon: Tonny Bollman, MD;  Location: [facility] CATH LAB;  Service: Cardiovascular;  Laterality: N/A;  . NASAL SEPTUM SURGERY    . NOSE SURGERY     X 2  . ORIF PATELLA Right 03/08/2015   Procedure:  OPEN REDUCTION INTERNAL FIXATION RIGHT  PATELLA TENDON AVULSION;  Surgeon: Durene Romans, MD;  Location: [facility] ORS;  Service: Orthopedics;  Laterality: Right;  . PACEMAKER INSERTION     Medtronic  . PATELLAR TENDON REPAIR Right 06/25/2015   Procedure: RIGHT PATELLA TENDON REVISION/REPAIR;  Surgeon: Durene Romans, MD;  Location: [facility] ORS;  Service: Orthopedics;  Laterality: Right;  . SHOULDER SURGERY     bilat   . THYROIDECTOMY, PARTIAL    . TONSILLECTOMY    . TOTAL KNEE ARTHROPLASTY Right 12/04/2014   Procedure: RIGHT TOTAL  KNEE  ARTHROPLASTY;  Surgeon: Durene Romans, MD;  Location: [facility] ORS;  Service: Orthopedics;  Laterality: Right;  . TRANSCATHETER AORTIC VALVE REPLACEMENT, TRANSFEMORAL  12/21/2012   a. 29mm Edwards Sapien XT transcatheter heart valve placed via open left transfemoral approach b. Intra-op TEE: well-seated bioprosthetic aortic valve with mean gradient 2 mmHg, trivial AI, mild MR, EF 30-35%  . TRANSCATHETER AORTIC VALVE REPLACEMENT, TRANSFEMORAL N/A 12/21/2012   Procedure: TRANSCATHETER AORTIC VALVE REPLACEMENT, TRANSFEMORAL;  Surgeon: Tonny Bollman, MD;  Location: [facility] OR;  Service: Open Heart Surgery;  Laterality: N/A;   HPI:  83 yo female adm to [facility] with respiratory difficulties, pt found to have pna.  Medical history significant of oxygen dependent chronic respiratory failure, C KD, initial fibrillation, chronic combined systolic and diastolic congestive heart failure, fibromyalgia, dementia, nonischemic cardiomyopathy, DVT/PE, symptomatic bradycardia status post pacemaker placement, CVA x3 in 2003 per pt, OSA, hypothyroidism, hyperlipidemia, neuropathy, hypertension.   Swallow evaluation ordered.    Assessment / Plan / Recommendation Clinical Impression  Pt presents with functional oropharyngeal swallow = adequate oral manipulation and timely swallow.  NO indication of airway compromise with po (sausage,grits, orange juice, coffee, applesauce with pills).  No focal CN deficits, xerostomia noted. Pt does become mildly dyspneic with po and advised to take rest breaks as needed.   Pt  with recent fall and reports she injured her gum and has not worn dentures since (only uses uppers).  She reports she is healed and SlP phoned son to request dentures be brought in for his mother.    Recommend continue softer foods due to lack of dentition and respiratory status.  Educated pt to findings/recommendations.     Pt has chronic dysphagia to foods - cornbread, corn, meat, etc - likley due to esophageal component.  Admits  to heart burn and indigestion during meals also and takes a reflux medication.  All education completed and SlP to sign off.  Thanks for this referral.   SLP Visit Diagnosis: Dysphagia, oral phase (R13.11)    Aspiration Risk  Mild aspiration risk    Diet Recommendation Regular;Dysphagia 3 (Mech soft);Thin liquid   Liquid Administration via: Cup;Straw Medication Administration: Whole meds with puree Supervision: Patient able to self feed Compensations: Slow rate;Small sips/bites Postural Changes: Seated upright at 90 degrees;Remain upright for at least 30 minutes after po intake    Other  Recommendations Oral Care Recommendations: Oral care BID   Follow up Recommendations        Frequency and Duration            Prognosis  Swallow Study   General Date of Onset: 07/22/16 HPI: 83 yo female adm to [facility] with respiratory difficulties, pt found to have pna.  Medical history significant of oxygen dependent chronic respiratory failure, C KD, initial fibrillation, chronic combined systolic and diastolic congestive heart failure, fibromyalgia, dementia, nonischemic cardiomyopathy, DVT/PE, symptomatic bradycardia status post pacemaker placement, CVA x3 in 2003 per pt, OSA, hypothyroidism, hyperlipidemia, neuropathy, hypertension.   Swallow evaluation ordered.  Type of Study: Bedside Swallow Evaluation Diet Prior to this Study: Regular;Thin liquids Temperature Spikes Noted: No Respiratory Status: Nasal cannula Behavior/Cognition: Alert;Cooperative;Pleasant mood Oral Cavity Assessment: Dry Oral Care Completed by SLP: No Oral Cavity - Dentition: Dentures, not available (pt reports she has not worn her dentures since her recent fall) Vision: Functional for self-feeding Self-Feeding Abilities: Able to feed self Patient Positioning: Upright in bed Baseline Vocal Quality: Normal Volitional Cough: Strong Volitional Swallow: Able to elicit    Oral/Motor/Sensory Function     Ice Chips  Ice chips: Not tested   Thin Liquid Thin Liquid: Within functional limits Presentation: Cup;Self Fed    Nectar Thick Nectar Thick Liquid: Not tested   Honey Thick Honey Thick Liquid: Not tested   Puree Puree: Within functional limits Presentation: Self Fed;Spoon   Solid   GO   Solid: Impaired Oral Phase Impairments: Impaired mastication;Reduced lingual movement/coordination Other Comments: pt does not have dentures in hospital, slow mastication        Mickie Bail [place], [place] [facility] SLP [phone]

## 2016-07-22 NOTE — Progress Notes (Signed)
PROGRESS NOTE    [name]  ZOX:[id number] DOB: Nov 22, 1932 DOA: 07/21/2016 PCP: Oneal Grout, MD   Brief Narrative:  83-year-old female with past medical history of oxygen dependent chronic respiratory failure, CKG stage III, atrial fibrillation, combined systolic and diastolic CHF, nonischemic cardiomyopathy admitted to the hospital for worsening of shortness of breath. She was noted to have signs of volume overload on chest x-ray and possible underlying pneumonia as well. She was given IV Lasix which significantly improved her breathing as she diuresed well but she still continues to have productive cough. Pro-calcitonin levels elevated therefore she started on Levaquin for underlying pneumonia.   Assessment & Plan:   Active Problems:   Paroxysmal atrial fibrillation (HCC)   Hypertension   S/P TAVR (transcatheter aortic valve replacement)   CKD (chronic kidney disease), stage III   Dementia without behavioral disturbance   Hypothyroidism   Acute on chronic combined systolic and diastolic congestive heart failure (HCC)   Acute on chronic respiratory failure with hypoxia (HCC)  Acute on chronic hypoxic respiratory failure -Multifactorial in nature with fluid overload in the setting of CHF exacerbation and also -Respiratory panel was negative, flu is negative. Sputum culture sent -Pro-calcitonin is elevated therefore Levaquin is started -DuoNeb's every 6 hours with albuterol as needed -Strict input and output and daily weight -Patient seen by speech swallow and cheeks determined to be a mild aspiration risk therefore dysphagia 3 diet has been recommended.  Hospital acquired pneumonia versus aspiration pneumonia -We will continue Levaquin at this time -Supplemental oxygen and wean her off as needed to her home setting of 2 L nasal cannula. -Respiratory panel is negative  Acute on chronic systolic congestive heart failure class III-IV -It is markedly improved at this time -I  will order for 1 more dose of Lasix IV today and then starting tomorrow will reassess her and likely continue torsemide 20 mg orally daily -Strict input and output, daily weights  History of atrial fibrillation and bradycardia status post pacemaker in place/coronary artery disease -Continue amiodarone Coreg and Coumadin  Hypertension continue Coreg and hydralazine, resume Imdur  Chronic pain and neuropathy -Continue Lyrica and Cymbalta as she also has depression  Mild dementia -Continue Namenda and Aricept  Gout -Continue Uloric  Hyperlipidemia Continue statin  Hypothyroidism -Continue Synthroid  Chronic pain- patient states she has tolerated oxycodone well in the past. Will order for one dose prn and encouraged her to use tylenol for pain and avoid narcotics.   DVT prophylaxis: Coumadin Code Status: DNI  Family Communication:  None Disposition Plan: Monitor in stepdown unit today. If she continues to do well she can be transferred to telemetry 24 hours.  Consultants:   None  Procedures:   None  Antimicrobials:   1 dose of vancomycin and cefepime  Levaquin 07/22/2006   Subjective: Patient states she feels low better than yesterday after diuresing still reports of shortness of breath with minimal exertion. She tells me she normally uses 2 L of oxygen around the clock or just at night depending on how she is feeling. No other complaints at this time  Objective: Vitals:   07/22/16 0824 07/22/16 1116 07/22/16 1251 07/22/16 1620  BP:  123/66  (!) 128/53  Pulse:  75  76  Resp:  14  16  Temp:  98.6 F (37 C)  98.9 F (37.2 C)  TempSrc:  Oral  Oral  SpO2: 98% 97% 97% 95%  Weight:      Height:  Intake/Output Summary (Last 24 hours) at 07/22/16 1625 Last data filed at 07/22/16 1200  Gross per 24 hour  Intake           685.83 ml  Output              400 ml  Net           285.83 ml   Filed Weights   07/21/16 0620 07/21/16 2050  Weight: 81.2 kg (179  lb) 80.8 kg (178 lb 3.2 oz)    Examination:  General exam: Appears calm and comfortable  Respiratory system: Diffuse diminished breath sounds with mild basilar expiratory wheezing. Cardiovascular system: S1 & S2 heard, RRR. No JVD, murmurs, rubs, gallops or clicks. 1+ [Ppitting pedal edema. Gastrointestinal system: Abdomen is nondistended, soft and nontender. No organomegaly or masses felt. Normal bowel sounds heard. Central nervous system: Alert and oriented. No focal neurological deficits. Extremities: Symmetric 5 x 5 power. Skin: No rashes, lesions or ulcers Psychiatry: Judgement and insight appear normal. Mood & affect appropriate.     Data Reviewed:   CBC:  Recent Labs Lab 07/16/16 0701 07/21/16 0627 07/22/16 0202  WBC 4.7 13.3* 12.2*  NEUTROABS  --  9.8*  --   HGB 9.6* 12.4 9.4*  HCT 28.9* 39.3 28.8*  MCV 94.4 95.6 95.0  PLT 200 305 175   Basic Metabolic Panel:  Recent Labs Lab 07/16/16 0701 07/21/16 0627 07/22/16 0202  NA 142 144 143  K 4.1 3.5 4.0  CL 104 107 104  CO2 28 22 29   GLUCOSE 85 271* 87  BUN 53* 24* 27*  CREATININE 2.32* 1.78* 1.82*  CALCIUM 8.9 9.8 9.0   GFR: Estimated Creatinine Clearance: 21.4 mL/min (A) (by C-G formula based on SCr of 1.82 mg/dL (H)). Liver Function Tests: No results for input(s): AST, ALT, ALKPHOS, BILITOT, PROT, ALBUMIN in the last 168 hours. No results for input(s): LIPASE, AMYLASE in the last 168 hours. No results for input(s): AMMONIA in the last 168 hours. Coagulation Profile:  Recent Labs Lab 07/16/16 0701 07/17/16 0552 07/21/16 1149 07/22/16 0202  INR 3.10 2.67 1.72 1.74   Cardiac Enzymes: No results for input(s): CKTOTAL, CKMB, CKMBINDEX, TROPONINI in the last 168 hours. BNP (last 3 results) No results for input(s): PROBNP in the last 8760 hours. HbA1C: No results for input(s): HGBA1C in the last 72 hours. CBG:  Recent Labs Lab 07/21/16 0643  GLUCAP 259*   Lipid Profile: No results for  input(s): CHOL, HDL, LDLCALC, TRIG, CHOLHDL, LDLDIRECT in the last 72 hours. Thyroid Function Tests: No results for input(s): TSH, T4TOTAL, FREET4, T3FREE, THYROIDAB in the last 72 hours. Anemia Panel: No results for input(s): VITAMINB12, FOLATE, FERRITIN, TIBC, IRON, RETICCTPCT in the last 72 hours. Sepsis Labs:  Recent Labs Lab 07/21/16 0805 07/21/16 1149 07/21/16 1624  PROCALCITON  --  2.34  --   LATICACIDVEN 5.12*  --  2.6*    Recent Results (from the past 240 hour(s))  MRSA PCR Screening     Status: None   Collection Time: 07/15/16 12:21 AM  Result Value Ref Range Status   MRSA by PCR NEGATIVE NEGATIVE Final    Comment:        The GeneXpert MRSA Assay (FDA approved for NASAL specimens only), is one component of a comprehensive MRSA colonization surveillance program. It is not intended to diagnose MRSA infection nor to guide or monitor treatment for MRSA infections.   Blood Culture (routine x 2)     Status: None (Preliminary  result)   Collection Time: 07/21/16  6:50 AM  Result Value Ref Range Status   Specimen Description LEFT ANTECUBITAL BOTH  Final   Special Requests 5CC  Final   Culture NO GROWTH 1 DAY  Final   Report Status PENDING  Incomplete  Blood Culture (routine x 2)     Status: None (Preliminary result)   Collection Time: 07/21/16  7:57 AM  Result Value Ref Range Status   Specimen Description BLOOD RIGHT HAND ARPEDB  Final   Special Requests 2CC  Final   Culture NO GROWTH 1 DAY  Final   Report Status PENDING  Incomplete  Respiratory Panel by PCR     Status: None   Collection Time: 07/21/16  4:02 PM  Result Value Ref Range Status   Adenovirus NOT DETECTED NOT DETECTED Final   Coronavirus 229E NOT DETECTED NOT DETECTED Final   Coronavirus HKU1 NOT DETECTED NOT DETECTED Final   Coronavirus NL63 NOT DETECTED NOT DETECTED Final   Coronavirus OC43 NOT DETECTED NOT DETECTED Final   Metapneumovirus NOT DETECTED NOT DETECTED Final   Rhinovirus / Enterovirus  NOT DETECTED NOT DETECTED Final   Influenza A NOT DETECTED NOT DETECTED Final   Influenza A H1 NOT DETECTED NOT DETECTED Final   Influenza A H1 2009 NOT DETECTED NOT DETECTED Final   Influenza A H3 NOT DETECTED NOT DETECTED Final   Influenza B NOT DETECTED NOT DETECTED Final   Parainfluenza Virus 1 NOT DETECTED NOT DETECTED Final   Parainfluenza Virus 2 NOT DETECTED NOT DETECTED Final   Parainfluenza Virus 3 NOT DETECTED NOT DETECTED Final   Parainfluenza Virus 4 NOT DETECTED NOT DETECTED Final   Respiratory Syncytial Virus NOT DETECTED NOT DETECTED Final   Bordetella pertussis NOT DETECTED NOT DETECTED Final   Chlamydophila pneumoniae NOT DETECTED NOT DETECTED Final   Mycoplasma pneumoniae NOT DETECTED NOT DETECTED Final         Radiology Studies: Dg Chest Port 1 View  Result Date: 07/22/2016 CLINICAL DATA:  Respiratory failure, asthma, atrial fibrillation, history of previous aortic valve replacement, chronic renal insufficiency. EXAM: PORTABLE CHEST 1 VIEW COMPARISON:  Portable chest x-ray of July 21, 2016 FINDINGS: The lungs are reasonably well inflated. The interstitium has cleared somewhat. There is persistent left basilar atelectasis or pneumonia with small left pleural effusion. The cardiac silhouette remains enlarged. The pulmonary vascularity is less engorged. The ICD is in stable position. There is calcification in the wall of the thoracic aorta. The prosthetic aortic valve cage is stable in appearance. IMPRESSION: Improved appearance of the pulmonary interstitium consistent with resolving pulmonary edema. Persistent left lower lobe atelectasis or pneumonia. Thoracic aortic atherosclerosis. Electronically Signed   By: David  [place] M.D.   On: 07/22/2016 07:17   Dg Chest Portable 1 View  Result Date: 07/21/2016 CLINICAL DATA:  Shortness of breath, cardiomyopathy, atrial fibrillation EXAM: PORTABLE CHEST 1 VIEW COMPARISON:  Portable chest x-ray of July 14, 2016 FINDINGS: The lungs  are adequately inflated. The interstitial markings are increased and more conspicuous than on the previous study. There is increased density at the left lung base with obscuration of the hemidiaphragm. The cardiac silhouette is mildly enlarged. The pulmonary vascularity is engorged. A prosthetic aortic valve cage remains visible. There is calcification in the wall of the aortic arch. The ICD is in stable position. IMPRESSION: Worsening of the pulmonary interstitium consistent with edema. There is bilateral pulmonary vascular congestion. Persistent left lower lobe atelectasis or pneumonia. Thoracic aortic atherosclerosis. Electronically Signed  By: David  [place] M.D.   On: 07/21/2016 07:05        Scheduled Meds: . amiodarone  100 mg Oral Daily  . atorvastatin  10 mg Oral q1800  . carvedilol  3.125 mg Oral BID WC  . cycloSPORINE  1 drop Both Eyes BID  . donepezil  10 mg Oral QHS  . DULoxetine  60 mg Oral q1800  . febuxostat  40 mg Oral q1800  . ferrous sulfate  325 mg Oral Daily  . furosemide  40 mg Intravenous Once  . hydrALAZINE  10 mg Oral TID  . ipratropium-albuterol  3 mL Nebulization QID  . levofloxacin (LEVAQUIN) IV  500 mg Intravenous Q48H  . levothyroxine  112 mcg Oral QAC breakfast  . linaclotide  145 mcg Oral Daily  . memantine  5 mg Oral BID  . ondansetron (ZOFRAN) IV  4 mg Intravenous Once  . pantoprazole  80 mg Oral Q1200  . torsemide  20 mg Oral Daily  . warfarin  3 mg Oral ONCE-1800  . Warfarin - Pharmacist Dosing Inpatient   Does not apply q1800   Continuous Infusions: . sodium chloride 50 mL/hr at 07/21/16 2217     LOS: 1 day    Time spent: 35 mins     Cyprian Gongaware Joline Maxcy, MD [facility] Pager [phone]   If 7PM-7AM, please contact night-coverage [email or web address] Password [email or web address] 07/22/2016, 4:25 PM

## 2016-07-23 LAB — PROTIME-INR
INR: 1.67
Prothrombin Time: 19.9 seconds — ABNORMAL HIGH (ref 11.4–15.2)

## 2016-07-23 LAB — PROCALCITONIN: Procalcitonin: 2.79 ng/mL

## 2016-07-23 MED ORDER — ALBUTEROL SULFATE (2.5 MG/3ML) 0.083% IN NEBU: 2.5000 mg | INHALATION_SOLUTION | RESPIRATORY_TRACT | Status: DC | PRN

## 2016-07-23 MED ORDER — WARFARIN SODIUM 5 MG PO TABS
5.0000 mg | ORAL_TABLET | Freq: Once | ORAL | Status: AC
  Administered 2016-07-23: 5 mg via ORAL
  Filled 2016-07-23: qty 1

## 2016-07-23 MED ORDER — HEPARIN BOLUS VIA INFUSION
3000.0000 [IU] | Freq: Once | INTRAVENOUS | Status: AC
  Administered 2016-07-23: 3000 [IU] via INTRAVENOUS
  Filled 2016-07-23: qty 3000

## 2016-07-23 MED ORDER — SENNOSIDES-DOCUSATE SODIUM 8.6-50 MG PO TABS
2.0000 | ORAL_TABLET | Freq: Every evening | ORAL | Status: DC | PRN
Start: 2016-07-23 — End: 2016-07-30
  Administered 2016-07-23 – 2016-07-28 (×4): 2 via ORAL
  Filled 2016-07-23 (×4): qty 2

## 2016-07-23 MED ORDER — HEPARIN (PORCINE) IN NACL 100-0.45 UNIT/ML-% IJ SOLN
900.0000 [IU]/h | INTRAMUSCULAR | Status: DC
  Administered 2016-07-23: 900 [IU]/h via INTRAVENOUS
  Filled 2016-07-23: qty 250

## 2016-07-23 MED ORDER — CARVEDILOL 6.25 MG PO TABS
6.2500 mg | ORAL_TABLET | Freq: Two times a day (BID) | ORAL | Status: DC
  Administered 2016-07-23 – 2016-07-30 (×14): 6.25 mg via ORAL
  Filled 2016-07-23 (×14): qty 1

## 2016-07-23 MED ORDER — IPRATROPIUM-ALBUTEROL 0.5-2.5 (3) MG/3ML IN SOLN
3.0000 mL | Freq: Three times a day (TID) | RESPIRATORY_TRACT | Status: DC
  Administered 2016-07-24 – 2016-07-27 (×12): 3 mL via RESPIRATORY_TRACT
  Filled 2016-07-23 (×11): qty 3

## 2016-07-23 NOTE — Progress Notes (Signed)
ANTICOAGULATION CONSULT NOTE  Pharmacy Consult for warfarin Indication: atrial fibrillation  Assessment: 89 yof with hx of afib on warfarin PTA. Also with hx DVT/PE noted. Pharmacy consulted to dose while inpatient.  INR still subtherapeutic at 1.67 this morning. Hgb down yesterday but no bleeding documented.  PTA warfarin dose:  1.5mg  daily (last dose 07/20/16 PTA)  Goal of Therapy:  INR 2-3 Monitor platelets by anticoagulation protocol: Yes   Plan:  Warfarin 5mg  again today Daily INR Monitor CBC, s/sx bleeding  Sheppard Coil PharmD., BCPS Clinical Pharmacist Pager [phone] 07/23/2016 1:56 PM

## 2016-07-23 NOTE — Progress Notes (Addendum)
[name]  [name]  NOB:[id number] DOB: 1932-12-17 DOA: 07/21/2016 PCP: Oneal Grout, MD    Brief Narrative:  83 year-old female with history of oxygen dependent chronic respiratory failure, CKD stage III, atrial fibrillation, combined systolic and diastolic CHF, and nonischemic cardiomyopathy who was admitted to the hospital for worsening of shortness of breath. She was noted to have signs of volume overload on chest x-ray and possible underlying pneumonia as well. She was given IV Lasix which significantly improved her breathing as she diuresed well but she still continued to have productive cough. Pro-calcitonin levels were elevated therefore she was started on Levaquin for possible pneumonia.  Subjective: The patient tells me she is feeling better.  She had a restless night last night and has therefore been quite sleepy today.  She denies current chest pain nausea vomiting or abdominal pain.  She feels her breathing is improved but not yet back to her baseline.  Assessment & Plan:  Acute on chronic hypoxic respiratory failure fluid overload in the setting of CHF exacerbation + PNA   Pneumonia - possible aspiration  continue Levaquin  Acute on chronic systolic and diastolic congestive heart failure  EF 35-40% w/ grade 2 DD via TTE Feb 2018 - baseline wgt appears to be ~80kg - no signif peripheral edema on exam today - continue maintenance diuretic - follow Is/Os and daily weights   Filed Weights   07/21/16 0620 07/21/16 2050 07/23/16 0400  Weight: 81.2 kg (179 lb) 80.8 kg (178 lb 3.2 oz) 80.5 kg (177 lb 6.4 oz)    Chronic atrial fibrillation Continue amiodarone BB and Coumadin - rate controlled - CHA2DS2-VASc is at least 5 - cover w/ IV heparin until INR at goal   S/P TAVR 2014  Bradycardia status post pacemaker   Hypertension  Poorly controlled - adjust treatment and follow  Chronic pain and neuropathy continue Lyrica and Cymbalta    Mild dementia continue Namenda and Aricept  Gout continue Uloric  Hyperlipidemia Continue statin  Hypothyroidism continue Synthroid  Chronic pain  DVT prophylaxis: warfarin Code Status:  DO NOT INTUBATE  Family Communication: no family present at time of exam  Disposition Plan: begin PT/OT - return to SNF in 24-48hrs   Consultants:  none  Procedures: none  Antimicrobials:  Levaquin 4/2 >  Objective: Blood pressure (!) 160/75, pulse (!) 59, temperature 97.8 F (36.6 C), temperature source Oral, resp. rate 17, height 4\' 10"  (1.473 m), weight 80.5 kg (177 lb 6.4 oz), SpO2 99 %.  Intake/Output Summary (Last 24 hours) at 07/23/16 1517 Last data filed at 07/23/16 1400  Gross per 24 hour  Intake           323.33 ml  Output             1300 ml  Net          -976.67 ml   Filed Weights   07/21/16 0620 07/21/16 2050 07/23/16 0400  Weight: 81.2 kg (179 lb) 80.8 kg (178 lb 3.2 oz) 80.5 kg (177 lb 6.4 oz)    Examination: General: No acute respiratory distress Lungs: Mild bibasilar crackles with no wheezing Cardiovascular: Regular rate and rhythm without murmur gallop or rub normal S1 and S2 Abdomen: Nontender, nondistended, soft, bowel sounds positive, no rebound, no ascites, no appreciable mass Extremities: No significant cyanosis, clubbing, or edema bilateral lower extremities  CBC:  Recent Labs Lab 07/21/16 0627 07/22/16 0202  WBC 13.3* 12.2*  NEUTROABS 9.8*  --  HGB 12.4 9.4*  HCT 39.3 28.8*  MCV 95.6 95.0  PLT 305 175   Basic Metabolic Panel:  Recent Labs Lab 07/21/16 0627 07/22/16 0202  NA 144 143  K 3.5 4.0  CL 107 104  CO2 22 29  GLUCOSE 271* 87  BUN 24* 27*  CREATININE 1.78* 1.82*  CALCIUM 9.8 9.0    Coagulation Profile:  Recent Labs Lab 07/17/16 0552 07/21/16 1149 07/22/16 0202 07/23/16 0542  INR 2.67 1.72 1.74 1.67    HbA1C: Hgb A1c MFr Bld  Date/Time Value Ref Range Status  06/14/2016 02:44 AM 5.0 4.8 - 5.6 % Final     Comment:    (NOTE)         Pre-diabetes: 5.7 - 6.4         Diabetes: >6.4         Glycemic control for adults with diabetes: <7.0   12/15/2014 01:27 AM 5.5 4.8 - 5.6 % Final    Comment:    (NOTE)         Pre-diabetes: 5.7 - 6.4         Diabetes: >6.4         Glycemic control for adults with diabetes: <7.0     CBG:  Recent Labs Lab 07/21/16 0643  GLUCAP 259*    Recent Results (from the past 240 hour(s))  MRSA PCR Screening     Status: None   Collection Time: 07/15/16 12:21 AM  Result Value Ref Range Status   MRSA by PCR NEGATIVE NEGATIVE Final    Comment:        The GeneXpert MRSA Assay (FDA approved for NASAL specimens only), is one component of a comprehensive MRSA colonization surveillance program. It is not intended to diagnose MRSA infection nor to guide or monitor treatment for MRSA infections.   Blood Culture (routine x 2)     Status: None (Preliminary result)   Collection Time: 07/21/16  6:50 AM  Result Value Ref Range Status   Specimen Description LEFT ANTECUBITAL BOTH  Final   Special Requests 5CC  Final   Culture NO GROWTH 2 DAYS  Final   Report Status PENDING  Incomplete  Blood Culture (routine x 2)     Status: None (Preliminary result)   Collection Time: 07/21/16  7:57 AM  Result Value Ref Range Status   Specimen Description BLOOD RIGHT HAND ARPEDB  Final   Special Requests 2CC  Final   Culture NO GROWTH 2 DAYS  Final   Report Status PENDING  Incomplete  Respiratory Panel by PCR     Status: None   Collection Time: 07/21/16  4:02 PM  Result Value Ref Range Status   Adenovirus NOT DETECTED NOT DETECTED Final   Coronavirus 229E NOT DETECTED NOT DETECTED Final   Coronavirus HKU1 NOT DETECTED NOT DETECTED Final   Coronavirus NL63 NOT DETECTED NOT DETECTED Final   Coronavirus OC43 NOT DETECTED NOT DETECTED Final   Metapneumovirus NOT DETECTED NOT DETECTED Final   Rhinovirus / Enterovirus NOT DETECTED NOT DETECTED Final   Influenza A NOT  DETECTED NOT DETECTED Final   Influenza A H1 NOT DETECTED NOT DETECTED Final   Influenza A H1 2009 NOT DETECTED NOT DETECTED Final   Influenza A H3 NOT DETECTED NOT DETECTED Final   Influenza B NOT DETECTED NOT DETECTED Final   Parainfluenza Virus 1 NOT DETECTED NOT DETECTED Final   Parainfluenza Virus 2 NOT DETECTED NOT DETECTED Final   Parainfluenza Virus 3 NOT DETECTED NOT DETECTED  Final   Parainfluenza Virus 4 NOT DETECTED NOT DETECTED Final   Respiratory Syncytial Virus NOT DETECTED NOT DETECTED Final   Bordetella pertussis NOT DETECTED NOT DETECTED Final   Chlamydophila pneumoniae NOT DETECTED NOT DETECTED Final   Mycoplasma pneumoniae NOT DETECTED NOT DETECTED Final     Scheduled Meds: . amiodarone  100 mg Oral Daily  . atorvastatin  10 mg Oral q1800  . carvedilol  3.125 mg Oral BID WC  . cycloSPORINE  1 drop Both Eyes BID  . donepezil  10 mg Oral QHS  . DULoxetine  60 mg Oral q1800  . febuxostat  40 mg Oral q1800  . ferrous sulfate  325 mg Oral Daily  . hydrALAZINE  10 mg Oral TID  . ipratropium-albuterol  3 mL Nebulization TID  . levofloxacin (LEVAQUIN) IV  500 mg Intravenous Q48H  . levothyroxine  112 mcg Oral QAC breakfast  . linaclotide  145 mcg Oral Daily  . memantine  5 mg Oral BID  . ondansetron (ZOFRAN) IV  4 mg Intravenous Once  . pantoprazole  80 mg Oral Q1200  . torsemide  20 mg Oral Daily  . warfarin  5 mg Oral ONCE-1800  . Warfarin - Pharmacist Dosing Inpatient   Does not apply q1800   Continuous Infusions: . sodium chloride 10 mL/hr at 07/23/16 0411     LOS: 2 days   Lonia Blood, MD [facility] Office  [phone] Pager - Text Page per Loretha Stapler as per below:  On-Call/Text Page:      [email or web address]      password [email or web address]  If 7PM-7AM, please contact night-coverage [email or web address] Password [email or web address] 07/23/2016, 3:17 PM

## 2016-07-23 NOTE — Progress Notes (Signed)
Report called to Alcario Drought, RN on 3E. Pt assessment unchanged from beginning of shift, transported by NT and Engineer, drilling. Belongings packed, including dentures in pink denture cup and transported with patient to new room. Pt declined RN offer to call family and update them of room change, stating daughter who was here earlier is aware and she will tell her son.

## 2016-07-23 NOTE — Progress Notes (Signed)
ANTICOAGULATION CONSULT NOTE - Initial Consult  Pharmacy Consult for heparin (bridge for subtherapeutic warfarin) Indication: atrial fibrillation  Allergies  Allergen Reactions  . Nubain [Nalbuphine Hcl] Hives    Went into cardiac arrest   . Codeine Hives and Nausea Only    Has tolerated Oxycodone  . Darvocet [Propoxyphene N-Acetaminophen] Nausea Only    Patient Measurements: Height: 4\' 10"  (147.3 cm) Weight: 177 lb 6.4 oz (80.5 kg) IBW/kg (Calculated) : 40.9 Heparin Dosing Weight: 60 kg  Vital Signs: Temp: 97.8 F (36.6 C) (04/04 1259) Temp Source: Oral (04/04 1259) BP: 160/75 (04/04 1259) Pulse Rate: 59 (04/04 1259)  Labs:  Recent Labs  07/21/16 0627 07/21/16 1149 07/22/16 0202 07/23/16 0542  HGB 12.4  --  9.4*  --   HCT 39.3  --  28.8*  --   PLT 305  --  175  --   LABPROT  --  20.4* 20.5* 19.9*  INR  --  1.72 1.74 1.67  CREATININE 1.78*  --  1.82*  --     Estimated Creatinine Clearance: 21.3 mL/min (A) (by C-G formula based on SCr of 1.82 mg/dL (H)).   Medical History: Past Medical History:  Diagnosis Date  . Acute on chronic renal failure [facility])    sees Dr Allena Katz   . Anemia    Acute blood loss  . Aortic regurgitation   . Aortic stenosis 10/13/2012   Low EF, low gradient with severe aortic stenosis confirmed by dobutamine stress echocardiogram s/p TAVR 12/2012  . Asthma   . Atrial fibrillation (HCC)    tachy-brady syndrome with <1% recurrent PAF since pacemaker placement  . Cataracts, bilateral   . Chronic combined systolic and diastolic CHF (congestive heart failure) (HCC)   . Chronic lower back pain   . CKD (chronic kidney disease)   . Coronary artery disease involving native coronary artery of native heart   . Dementia    Without behavioral disturbance  . Dysrhythmia   . Fibromyalgia   . Gastroesophageal reflux disease   . H/O dizziness   . H/O urinary frequency   . H/O: stroke   . Hard of hearing   . Headache    hx of migraines   . Heart  murmur   . History of blood transfusion   . History of bronchitis   . History of kidney stones   . History of urinary tract infection   . HLD (hyperlipidemia)   . HTN (hypertension)   . Hypothyroidism   . Neuropathy (HCC)   . Nonischemic cardiomyopathy (HCC)   . On home oxygen therapy    patient uses at nite- 2L- has not used in > 6 months per patient  . Osteoarthritis   . Osteoporosis   . Pneumonia    hx of x 3   . PONV (postoperative nausea and vomiting)   . Presence of permanent cardiac pacemaker   . Pulmonary embolism (HCC)    HISTORY OF, the pt. had a recurrent bilateral pulmonary emboli in 2005, on warfarin therapy and at which time she under went implantation of IVC filter  . Repeated falls   . Rupture of right patellar tendon   . Sciatica   . Shortness of breath dyspnea    with exertion   . Sleep apnea    uses oxygen at night and PRN- not used since > 6 months / DOES NOT USE  C-PAP  . Spinal stenosis   . Stroke (HCC)   . Symptomatic bradycardia   .  Symptomatic bradycardia 2012   s/p [employer] PPM  . Syncope   . Urinary incontinence   . Urinary urgency     Assessment: 27 yof with hx of afib on warfarin PTA. Also with hx DVT/PE noted. Pharmacy consulted to start heparin drip. INR remained subtherapeutic this morning, so starting a heparin bridge. Hgb down yesterday but no bleeding documented.  Goal of Therapy:  Heparin level 0.3-0.7 units/ml Monitor platelets by anticoagulation protocol: Yes   Plan:  Give 3000 units bolus x 1 Start heparin infusion at 900 units/hr Check anti-Xa level in 8 hours and daily while on heparin Continue to monitor H&H and platelets   Thank you for allowing [place] to participate in this patients care. Signe Colt, PharmD Pager: [phone]

## 2016-07-24 ENCOUNTER — Inpatient Hospital Stay (HOSPITAL_COMMUNITY): Payer: Medicare Other

## 2016-07-24 LAB — COMPREHENSIVE METABOLIC PANEL
ALK PHOS: 56 U/L (ref 38–126)
ALT: 14 U/L (ref 14–54)
AST: 19 U/L (ref 15–41)
Albumin: 2.9 g/dL — ABNORMAL LOW (ref 3.5–5.0)
Anion gap: 11 (ref 5–15)
BILIRUBIN TOTAL: 0.7 mg/dL (ref 0.3–1.2)
BUN: 26 mg/dL — AB (ref 6–20)
CALCIUM: 8.7 mg/dL — AB (ref 8.9–10.3)
CO2: 34 mmol/L — ABNORMAL HIGH (ref 22–32)
Chloride: 98 mmol/L — ABNORMAL LOW (ref 101–111)
Creatinine, Ser: 1.79 mg/dL — ABNORMAL HIGH (ref 0.44–1.00)
GFR calc Af Amer: 29 mL/min — ABNORMAL LOW (ref >60)
GFR calc non Af Amer: 25 mL/min — ABNORMAL LOW (ref >60)
Glucose, Bld: 101 mg/dL — ABNORMAL HIGH (ref 65–99)
POTASSIUM: 3.3 mmol/L — AB (ref 3.5–5.1)
Sodium: 143 mmol/L (ref 135–145)
TOTAL PROTEIN: 5.3 g/dL — AB (ref 6.5–8.1)

## 2016-07-24 LAB — CBC
HEMATOCRIT: 30.8 % — AB (ref 36.0–46.0)
Hemoglobin: 9.8 g/dL — ABNORMAL LOW (ref 12.0–15.0)
MCH: 30.1 pg (ref 26.0–34.0)
MCHC: 31.8 g/dL (ref 30.0–36.0)
MCV: 94.5 fL (ref 78.0–100.0)
Platelets: 175 10*3/uL (ref 150–400)
RBC: 3.26 MIL/uL — ABNORMAL LOW (ref 3.87–5.11)
RDW: 14.9 % (ref 11.5–15.5)
WBC: 7.4 10*3/uL (ref 4.0–10.5)

## 2016-07-24 LAB — PROTIME-INR
INR: 2.13
Prothrombin Time: 24.2 seconds — ABNORMAL HIGH (ref 11.4–15.2)

## 2016-07-24 LAB — HEPARIN LEVEL (UNFRACTIONATED): Heparin Unfractionated: 0.42 IU/mL (ref 0.30–0.70)

## 2016-07-24 MED ORDER — WARFARIN SODIUM 2 MG PO TABS
4.0000 mg | ORAL_TABLET | Freq: Every day | ORAL | Status: DC
  Administered 2016-07-24: 4 mg via ORAL
  Filled 2016-07-24: qty 2

## 2016-07-24 MED ORDER — OXYCODONE HCL 5 MG PO TABS
5.0000 mg | ORAL_TABLET | Freq: Four times a day (QID) | ORAL | Status: DC | PRN
  Administered 2016-07-24 – 2016-07-29 (×9): 5 mg via ORAL
  Filled 2016-07-24 (×9): qty 1

## 2016-07-24 MED ORDER — DICLOFENAC SODIUM 1 % TD GEL
2.0000 g | Freq: Three times a day (TID) | TRANSDERMAL | Status: DC
  Administered 2016-07-24 – 2016-07-30 (×15): 2 g via TOPICAL
  Filled 2016-07-24: qty 100

## 2016-07-24 MED ORDER — FUROSEMIDE 10 MG/ML IJ SOLN
40.0000 mg | Freq: Two times a day (BID) | INTRAMUSCULAR | Status: DC
  Administered 2016-07-24 – 2016-07-25 (×2): 40 mg via INTRAVENOUS
  Filled 2016-07-24 (×2): qty 4

## 2016-07-24 MED ORDER — POTASSIUM CHLORIDE CRYS ER 20 MEQ PO TBCR
20.0000 meq | EXTENDED_RELEASE_TABLET | Freq: Once | ORAL | Status: AC
  Administered 2016-07-24: 20 meq via ORAL
  Filled 2016-07-24: qty 1

## 2016-07-24 NOTE — NC FL2 (Signed)
[place] MEDICAID FL2 LEVEL OF CARE SCREENING TOOL     IDENTIFICATION  Patient Name: [name] Birthdate: May 19, 1932 Sex: female Admission Date (Current Location): 07/21/2016  [facility] and [place] Number:  Producer, television/film/video and Address:  [facility], [address] [place], [place] [address]      Provider Number: [id number]  Attending Physician Name and Address:  Zannie Cove, MD  Relative Name and Phone Number:       Current Level of Care: Hospital Recommended Level of Care: Skilled Nursing Facility Prior Approval Number:    Date Approved/Denied:   PASRR Number: [id number]  Discharge Plan: SNF    Current Diagnoses: Patient Active Problem List   Diagnosis Date Noted  . Respiratory failure (HCC) 07/21/2016  . Acute on chronic respiratory failure with hypoxia (HCC) 07/21/2016  . Head contusion 07/15/2016  . Fall 07/14/2016  . Chest pain at rest   . Acute on chronic combined systolic and diastolic congestive heart failure (HCC)   . Unstable angina (HCC)   . Acute respiratory failure (HCC) 06/13/2016  . Elevated blood sugar 06/13/2016  . Dementia without behavioral disturbance 01/09/2016  . Chronic constipation 01/09/2016  . Neuropathic pain 01/09/2016  . Esophageal reflux 01/09/2016  . Hypothyroidism 01/09/2016  . CKD (chronic kidney disease) stage 4, GFR 15-29 ml/min (HCC) 01/09/2016  . Right patellar tendon rupture 06/25/2015  . Avulsion of right patellar tendon 03/08/2015  . Aspiration pneumonia (HCC) 12/14/2014  . Anemia of chronic disease 12/14/2014  . Aspiration into airway 12/14/2014  . Swallowing dysfunction 12/14/2014  . S/P right TKA 12/04/2014  . S/P knee replacement 12/04/2014  . Acute blood loss anemia 12/28/2012  . Thrombocytopenia (HCC) 12/28/2012  . Toe fracture 12/28/2012  . CKD (chronic kidney disease), stage III 12/28/2012  . Low back pain 12/28/2012  . Wound dehiscence, surgical 12/28/2012  .  Atelectasis 12/28/2012  . S/P TAVR (transcatheter aortic valve replacement) 12/21/2012  . Severe aortic stenosis 12/06/2012  . Aortic stenosis 10/13/2012  . Acute on chronic systolic and diastolic heart failure, NYHA class 3 (HCC) 10/13/2012  . Pacemaker 10/15/2010  . Paroxysmal atrial fibrillation (HCC) 10/15/2010  . Dyslipidemia 10/15/2010  . Hypertension 10/15/2010  . Chronic diastolic heart failure (HCC) 10/15/2010    Orientation RESPIRATION BLADDER Height & Weight     Self, Time, Situation, Place  O2 (Nasal Canula 2 L) Continent Weight: 165 lb (74.8 kg) Height:  4\' 10"  (147.3 cm)  BEHAVIORAL SYMPTOMS/MOOD NEUROLOGICAL BOWEL NUTRITION STATUS   (None)  (Dementia) Continent Diet (Heart healthy)  AMBULATORY STATUS COMMUNICATION OF NEEDS Skin   Limited Assist Verbally Skin abrasions, Bruising, Other (Comment) (Skin tear. Non-pressure wound lower back: Foam prn.)                       Personal Care Assistance Level of Assistance  Bathing, Feeding, Dressing Bathing Assistance: Limited assistance Feeding assistance: Independent Dressing Assistance: Limited assistance     Functional Limitations Info  Sight, Hearing, Speech Sight Info: Adequate Hearing Info: Adequate Speech Info: Adequate    SPECIAL CARE FACTORS FREQUENCY  Blood pressure, PT (By licensed PT), OT (By licensed OT)     PT Frequency: 5 x week OT Frequency: 5 x week            Contractures Contractures Info: Not present    Additional Factors Info  Code Status, Allergies Code Status Info: Partial: DNI Allergies Info: Nubain (Nalbuphine Hcl), Codeine, Darvocet (Propoxyphene N-Acetaminophen)  Current Medications (07/24/2016):  This is the current hospital active medication list Current Facility-Administered Medications  Medication Dose Route Frequency Provider Last Rate Last Dose  . acetaminophen (TYLENOL) tablet 650 mg  650 mg Oral Q6H PRN Ozella Rocks, MD       Or  . acetaminophen  (TYLENOL) suppository 650 mg  650 mg Rectal Q6H PRN Ozella Rocks, MD      . albuterol (PROVENTIL) (2.5 MG/3ML) 0.083% nebulizer solution 2.5 mg  2.5 mg Nebulization Q2H PRN Lonia Blood, MD      . amiodarone (PACERONE) tablet 100 mg  100 mg Oral Daily Ozella Rocks, MD   100 mg at 07/24/16 1025  . atorvastatin (LIPITOR) tablet 10 mg  10 mg Oral q1800 Ozella Rocks, MD   10 mg at 07/23/16 1800  . carvedilol (COREG) tablet 6.25 mg  6.25 mg Oral BID WC Lonia Blood, MD   6.25 mg at 07/24/16 0741  . cycloSPORINE (RESTASIS) 0.05 % ophthalmic emulsion 1 drop  1 drop Both Eyes BID Ozella Rocks, MD   1 drop at 07/24/16 1033  . diclofenac sodium (VOLTAREN) 1 % transdermal gel 2 g  2 g Topical TID AC & HS Leda Gauze, NP   2 g at 07/24/16 1038  . donepezil (ARICEPT) tablet 10 mg  10 mg Oral QHS Ozella Rocks, MD   10 mg at 07/23/16 2019  . DULoxetine (CYMBALTA) DR capsule 60 mg  60 mg Oral q1800 Ozella Rocks, MD   60 mg at 07/23/16 1800  . febuxostat (ULORIC) tablet 40 mg  40 mg Oral q1800 Ozella Rocks, MD   40 mg at 07/23/16 1800  . ferrous sulfate tablet 325 mg  325 mg Oral Daily Ozella Rocks, MD   325 mg at 07/24/16 1028  . furosemide (LASIX) injection 40 mg  40 mg Intravenous Q12H Zannie Cove, MD      . hydrALAZINE (APRESOLINE) tablet 10 mg  10 mg Oral TID Ozella Rocks, MD   10 mg at 07/24/16 1025  . ipratropium-albuterol (DUONEB) 0.5-2.5 (3) MG/3ML nebulizer solution 3 mL  3 mL Nebulization TID Lonia Blood, MD   3 mL at 07/24/16 0723  . levofloxacin (LEVAQUIN) IVPB 500 mg  500 mg Intravenous Q48H Emi Holes, RPH 100 mL/hr at 07/23/16 1800 500 mg at 07/23/16 1800  . levothyroxine (SYNTHROID, LEVOTHROID) tablet 112 mcg  112 mcg Oral QAC breakfast Renaee Munda, RPH   112 mcg at 07/24/16 0755  . linaclotide (LINZESS) capsule 145 mcg  145 mcg Oral Daily Ozella Rocks, MD   145 mcg at 07/24/16 1033  . memantine (NAMENDA) tablet 5 mg  5 mg Oral BID  Ozella Rocks, MD   5 mg at 07/24/16 1026  . ondansetron (ZOFRAN) tablet 4 mg  4 mg Oral Q6H PRN Ozella Rocks, MD   4 mg at 07/24/16 0114   Or  . ondansetron [facility]) injection 4 mg  4 mg Intravenous Q6H PRN Ozella Rocks, MD      . pantoprazole (PROTONIX) EC tablet 80 mg  80 mg Oral Q1200 Ozella Rocks, MD   80 mg at 07/23/16 1200  . pramoxine (SARNA SENSITIVE) 1 % lotion 1 application  1 application Topical Daily PRN Ozella Rocks, MD      . pregabalin (LYRICA) capsule 50 mg  50 mg Oral BID PRN Ozella Rocks, MD   50 mg  at 07/24/16 0516  . senna-docusate (Senokot-S) tablet 2 tablet  2 tablet Oral QHS PRN Leda Gauze, NP   2 tablet at 07/23/16 2018  . warfarin (COUMADIN) tablet 4 mg  4 mg Oral q1800 Zannie Cove, MD      . Warfarin - Pharmacist Dosing Inpatient   Does not apply q1800 Almon Hercules, [facility]       Facility-Administered Medications Ordered in Other Encounters  Medication Dose Route Frequency Provider Last Rate Last Dose  . sodium bicarbonate first hour bolus via infusion   Intravenous Once Pearson Grippe, MD         Discharge Medications: Please see discharge summary for a list of discharge medications.  Relevant Imaging Results:  Relevant Lab Results:   Additional Information [id number]  Margarito Liner, LCSW

## 2016-07-24 NOTE — Progress Notes (Signed)
ANTICOAGULATION CONSULT NOTE  Pharmacy Consult for warfarin Indication: atrial fibrillation  Assessment: 35 yof with hx of afib on warfarin PTA. Also with hx DVT/PE noted. Pharmacy consulted to dose while inpatient.  INR now therapeutic at 2.13, heparin stopped  PTA warfarin dose:  1.5mg  daily (last dose 07/20/16 PTA)  Goal of Therapy:  INR 2-3 Monitor platelets by anticoagulation protocol: Yes   Plan:  Try warfarin 4 mg po daily Daily INR Monitor CBC, s/sx bleeding  Thank you Okey Regal, PharmD [phone] 07/24/2016 8:53 AM

## 2016-07-24 NOTE — Evaluation (Signed)
Physical Therapy Evaluation Patient Details Name: CARNELLA FRYMAN MRN: [id number] DOB: 02/25/1933 Today's Date: 07/24/2016   History of Present Illness  ZOWIE LUNDAHL is a 83 y.o. female with medical history significant of oxygen dependent chronic respiratory failure, CKD, initial fibrillation, chronic combined systolic and diastolic CHF, fibromyalgia, dementia, nonischemic cardiomyopathy, DVT/PE, symptomatic bradycardia status post pacemaker placement, CVA, OSA,  neuropathy, HTN and was admitted for SOB and dx with acute on chronic respiratory failure  Clinical Impression  Pt OOB in recliner chair when therapist entered room. Prior to admission, pt reported that she occasionally ambulated with RW and assist but mostly used a w/c for mobility at her SNF. Pt requesting assistance adjusting in her recliner chair to eat her lunch. Pt performed sit-to-stand with RW and min guard for safety. Pt able to use bilateral UEs on arm rests to scoot her buttocks further back into the chair. Pt would continue to benefit from skilled physical therapy services at this time while admitted and after d/c to address the below listed limitations in order to improve overall safety and independence with functional mobility.     Follow Up Recommendations SNF    Equipment Recommendations  None recommended by PT    Recommendations for Other Services       Precautions / Restrictions Precautions Precautions: Fall Restrictions Weight Bearing Restrictions: No      Mobility  Bed Mobility Overal bed mobility: Modified Independent Bed Mobility: Supine to Sit           General bed mobility comments: Pt OOB in recliner chair when therapist entered  Transfers Overall transfer level: Needs assistance Equipment used: Rolling walker (2 wheeled) Transfers: Sit to/from Stand Sit to Stand: Min guard Stand pivot transfers: Min assist       General transfer comment: increased time, VC'ing for bilateral hand  placement and min guard for safety  Ambulation/Gait             General Gait Details: deferred at this time as pt was only interested in readjusting in her chair to eat her lunch.   Stairs            Wheelchair Mobility    Modified Rankin (Stroke Patients Only)       Balance Overall balance assessment: Needs assistance Sitting-balance support: Feet supported;No upper extremity supported Sitting balance-Leahy Scale: Fair     Standing balance support: During functional activity;Bilateral upper extremity supported Standing balance-Leahy Scale: Poor Standing balance comment: pt reliant on bilateral UE on RW                             Pertinent Vitals/Pain Pain Assessment: Faces Faces Pain Scale: Hurts even more Pain Location: low back and neck Pain Descriptors / Indicators: Aching;Grimacing;Sore Pain Intervention(s): Monitored during session;Repositioned    Home Living Family/patient expects to be discharged to:: Skilled nursing facility                 Additional Comments: [facility]    Prior Function Level of Independence: Needs assistance   Gait / Transfers Assistance Needed: pt reported that she doesn't get to ambulate much at her SNF  ADL's / Homemaking Assistance Needed: Pt states she normally does her own bathing and dressing from W/C level and toilets herself  Comments: Pt reports she primarily uses w/c at SNF because she is not allowed to amb on her own and staff have not been able to assist her  with amb     Hand Dominance   Dominant Hand: Right    Extremity/Trunk Assessment   Upper Extremity Assessment Upper Extremity Assessment: Defer to OT evaluation    Lower Extremity Assessment Lower Extremity Assessment: Generalized weakness       Communication   Communication: HOH  Cognition Arousal/Alertness: Awake/alert Behavior During Therapy: WFL for tasks assessed/performed Overall Cognitive Status: Within Functional  Limits for tasks assessed                                        General Comments      Exercises     Assessment/Plan    PT Assessment Patient needs continued PT services  PT Problem List Decreased strength;Decreased activity tolerance;Decreased balance;Decreased mobility;Decreased knowledge of use of DME;Decreased coordination       PT Treatment Interventions DME instruction;Gait training;Functional mobility training;Therapeutic activities;Therapeutic exercise;Balance training;Neuromuscular re-education;Patient/family education    PT Goals (Current goals can be found in the Care Plan section)  Acute Rehab PT Goals Patient Stated Goal: to be able to walk more PT Goal Formulation: With patient Time For Goal Achievement: 08/07/16 Potential to Achieve Goals: Good    Frequency Min 2X/week   Barriers to discharge        Co-evaluation               End of Session Equipment Utilized During Treatment: Gait belt Activity Tolerance: Patient tolerated treatment well Patient left: in chair;with call bell/phone within reach;with chair alarm set Nurse Communication: Mobility status PT Visit Diagnosis: Unsteadiness on feet (R26.81);Muscle weakness (generalized) (M62.81)    Time: 9509-3267 PT Time Calculation (min) (ACUTE ONLY): 10 min   Charges:   PT Evaluation $PT Eval Moderate Complexity: 1 Procedure     PT G Codes:        Deborah Chalk, PT, DPT [phone]   Alessandra Bevels Parks Czajkowski 07/24/2016, 1:25 PM

## 2016-07-24 NOTE — Clinical Social Work Note (Signed)
Clinical Social Work Assessment  Patient Details  Name: [name] MRN: [id number] Date of Birth: 07-29-1932  Date of referral:  07/24/16               Reason for consult:  Discharge Planning                Permission sought to share information with:  Facility Sport and exercise psychologist, Family Supports Permission granted to share information::  Yes, Verbal Permission Granted  Name::        Agency::  [facility]  Relationship::     Contact Information:     Housing/Transportation Living arrangements for the past 2 months:  [place] of Information:  Patient, Medical Team Patient Interpreter Needed:  None Criminal Activity/Legal Involvement Pertinent to Current Situation/Hospitalization:  No - Comment as needed Significant Relationships:  Adult Children Lives with:  Facility Resident Do you feel safe going back to the place where you live?  Yes Need for family participation in patient care:  Yes (Comment)  Care giving concerns:  Patient is a long-term resident from [facility].    Social Worker assessment / plan:  CSW met with patient. No supports at bedside. CSW introduced role and explained that discharge planning would be discussed. Patient confirmed that she was admitted from [facility] and plans to return once stable for discharge. No further concerns. CSW encouraged patient to contact CSW as needed. CSW will continue to follow patient for support and facilitate discharge to SNF once medically stable.  Employment status:  Retired Nurse, adult PT Recommendations:  Not assessed at this time Information / Referral to community resources:  [place]  Patient/Family's Response to care:  Patient agreeable to return to SNF. Patient's children supportive and involved in patient's care. Patient appreciated social work intervention.  Patient/Family's Understanding of and Emotional Response to Diagnosis, Current  Treatment, and Prognosis:  Patient has a good understanding of the reason for admission. Patient appears happy with hospital care.  Emotional Assessment Appearance:  Appears stated age Attitude/Demeanor/Rapport:  Other (Pleasant) Affect (typically observed):  Accepting, Appropriate, Calm, Pleasant Orientation:  Oriented to Self, Oriented to Place, Oriented to  Time, Oriented to Situation Alcohol / Substance use:  Never Used Psych involvement (Current and /or in the community):  No (Comment)  Discharge Needs  Concerns to be addressed:  Care Coordination Readmission within the last 30 days:  Yes Current discharge risk:  None Barriers to Discharge:  Continued Medical Work up   Candie Chroman, LCSW 07/24/2016, 1:10 PM

## 2016-07-24 NOTE — Progress Notes (Signed)
[name]  [name]  [name] [name]: 09-May-1932 DOA: 07/21/2016 PCP: Oneal Grout, MD    Brief Narrative:  84 year-old female with history of oxygen dependent chronic respiratory failure, CKD stage III, atrial fibrillation, combined systolic and diastolic CHF, and nonischemic cardiomyopathy who was admitted to the hospital for worsening of shortness of breath. She was just discharged on 3/29, was noted to have signs of volume overload on chest x-ray and possible underlying pneumonia as well. She was given IV Lasix which significantly improved her breathing as she diuresed well but she still continued to have productive cough. Pro-calcitonin levels were elevated therefore she was started on Levaquin for possible pneumonia.  Subjective: Breathing is improving, not back to baseline yet  Assessment & Plan:  Acute on chronic hypoxic respiratory failure fluid overload in the setting of CHF exacerbation + PNA   Pneumonia - possible aspiration  continue Levaquin Day 4/7  Acute on chronic systolic and diastolic congestive heart failure  EF 35-40% w/ grade 2 DD via TTE Feb 2018  -improving with diuresis -was discharged off diuretics last admit due to AKI/dehydration -I/O about even, clinically still vol overloaded, will resume IV lasix, hold Torsemide -monitor weight closely -complicated by CKD 3-4  Chronic atrial fibrillation -Continue amiodarone BB and Coumadin - rate controlled - CHA2DS2-VASc is at least 5  -stop heparin/continue warfarin, INR >2  S/P TAVR 2014  Bradycardia status post pacemaker   Hypertension  Poorly controlled - adjust treatment and follow  Chronic pain and neuropathy continue Lyrica and Cymbalta   Mild dementia continue Namenda and Aricept  Gout continue Uloric  Hyperlipidemia Continue statin  Hypothyroidism continue Synthroid  Chronic pain  DVT prophylaxis: warfarin Code Status:  DO NOT INTUBATE    Family Communication: no family present at time of exam  Disposition Plan: SNF in 1-2days  Consultants:  none  Procedures: none  Antimicrobials:  Levaquin 4/2 >  Objective: Blood pressure 140/64, pulse (!) 59, temperature 98.1 F (36.7 C), temperature source Oral, resp. rate 18, height 4\' 10"  (1.473 m), weight 74.8 kg (165 lb), SpO2 96 %.  Intake/Output Summary (Last 24 hours) at 07/24/16 1201 Last data filed at 07/24/16 0900  Gross per 24 hour  Intake           269.75 ml  Output              450 ml  Net          -180.25 ml   Filed Weights   07/23/16 0400 07/23/16 2136 07/24/16 0447  Weight: 80.5 kg (177 lb 6.4 oz) 78.1 kg (172 lb 3.2 oz) 74.8 kg (165 lb)    Examination: General: No acute respiratory distress Lungs: Mild bibasilar crackles with no wheezing Cardiovascular: Regular rate and rhythm without murmur gallop or rub normal S1 and S2 Abdomen: Nontender, nondistended, soft, bowel sounds positive, no rebound, no ascites, no appreciable mass Extremities: No significant cyanosis, clubbing, or edema bilateral lower extremities Neuro: non focal Skin: some bruises noted  CBC:  Recent Labs Lab 07/21/16 0627 07/22/16 0202 07/24/16 0216  WBC 13.3* 12.2* 7.4  NEUTROABS 9.8*  --   --   HGB 12.4 9.4* 9.8*  HCT 39.3 28.8* 30.8*  MCV 95.6 95.0 94.5  PLT 305 175 175   Basic Metabolic Panel:  Recent Labs Lab 07/21/16 0627 07/22/16 0202 07/24/16 0216  NA 144 143 143  K 3.5 4.0 3.3*  CL 107 104 98*  CO2 22  29 34*  GLUCOSE 271* 87 101*  BUN 24* 27* 26*  CREATININE 1.78* 1.82* 1.79*  CALCIUM 9.8 9.0 8.7*    Coagulation Profile:  Recent Labs Lab 07/21/16 1149 07/22/16 0202 07/23/16 0542 07/24/16 0216  INR 1.72 1.74 1.67 2.13    HbA1C: Hgb A1c MFr Bld  Date/Time Value Ref Range Status  06/14/2016 02:44 AM 5.0 4.8 - 5.6 % Final    Comment:    (NOTE)         Pre-diabetes: 5.7 - 6.4         Diabetes: >6.4         Glycemic control for adults with  diabetes: <7.0   12/15/2014 01:27 AM 5.5 4.8 - 5.6 % Final    Comment:    (NOTE)         Pre-diabetes: 5.7 - 6.4         Diabetes: >6.4         Glycemic control for adults with diabetes: <7.0     CBG:  Recent Labs Lab 07/21/16 0643  GLUCAP 259*    Recent Results (from the past 240 hour(s))  MRSA PCR Screening     Status: None   Collection Time: 07/15/16 12:21 AM  Result Value Ref Range Status   MRSA by PCR NEGATIVE NEGATIVE Final    Comment:        The GeneXpert MRSA Assay (FDA approved for NASAL specimens only), is one component of a comprehensive MRSA colonization surveillance program. It is not intended to diagnose MRSA infection nor to guide or monitor treatment for MRSA infections.   Blood Culture (routine x 2)     Status: None (Preliminary result)   Collection Time: 07/21/16  6:50 AM  Result Value Ref Range Status   Specimen Description LEFT ANTECUBITAL BOTH  Final   Special Requests 5CC  Final   Culture NO GROWTH 2 DAYS  Final   Report Status PENDING  Incomplete  Blood Culture (routine x 2)     Status: None (Preliminary result)   Collection Time: 07/21/16  7:57 AM  Result Value Ref Range Status   Specimen Description BLOOD RIGHT HAND ARPEDB  Final   Special Requests 2CC  Final   Culture NO GROWTH 2 DAYS  Final   Report Status PENDING  Incomplete  Respiratory Panel by PCR     Status: None   Collection Time: 07/21/16  4:02 PM  Result Value Ref Range Status   Adenovirus NOT DETECTED NOT DETECTED Final   Coronavirus 229E NOT DETECTED NOT DETECTED Final   Coronavirus HKU1 NOT DETECTED NOT DETECTED Final   Coronavirus NL63 NOT DETECTED NOT DETECTED Final   Coronavirus OC43 NOT DETECTED NOT DETECTED Final   Metapneumovirus NOT DETECTED NOT DETECTED Final   Rhinovirus / Enterovirus NOT DETECTED NOT DETECTED Final   Influenza A NOT DETECTED NOT DETECTED Final   Influenza A H1 NOT DETECTED NOT DETECTED Final   Influenza A H1 2009 NOT DETECTED NOT DETECTED  Final   Influenza A H3 NOT DETECTED NOT DETECTED Final   Influenza B NOT DETECTED NOT DETECTED Final   Parainfluenza Virus 1 NOT DETECTED NOT DETECTED Final   Parainfluenza Virus 2 NOT DETECTED NOT DETECTED Final   Parainfluenza Virus 3 NOT DETECTED NOT DETECTED Final   Parainfluenza Virus 4 NOT DETECTED NOT DETECTED Final   Respiratory Syncytial Virus NOT DETECTED NOT DETECTED Final   Bordetella pertussis NOT DETECTED NOT DETECTED Final   Chlamydophila pneumoniae NOT DETECTED NOT DETECTED  Final   Mycoplasma pneumoniae NOT DETECTED NOT DETECTED Final     Scheduled Meds: . amiodarone  100 mg Oral Daily  . atorvastatin  10 mg Oral q1800  . carvedilol  6.25 mg Oral BID WC  . cycloSPORINE  1 drop Both Eyes BID  . diclofenac sodium  2 g Topical TID AC & HS  . donepezil  10 mg Oral QHS  . DULoxetine  60 mg Oral q1800  . febuxostat  40 mg Oral q1800  . ferrous sulfate  325 mg Oral Daily  . hydrALAZINE  10 mg Oral TID  . ipratropium-albuterol  3 mL Nebulization TID  . levofloxacin (LEVAQUIN) IV  500 mg Intravenous Q48H  . levothyroxine  112 mcg Oral QAC breakfast  . linaclotide  145 mcg Oral Daily  . memantine  5 mg Oral BID  . pantoprazole  80 mg Oral Q1200  . torsemide  20 mg Oral Daily  . warfarin  4 mg Oral q1800  . Warfarin - Pharmacist Dosing Inpatient   Does not apply q1800   Continuous Infusions:    LOS: 3 days   Zannie Cove, MD [facility] Office  [phone] Pager - Text Page per Amion as per below:  On-Call/Text Page:      [email or web address]      password [email or web address]  If 7PM-7AM, please contact night-coverage [email or web address] Password [email or web address] 07/24/2016, 12:01 PM

## 2016-07-24 NOTE — Evaluation (Addendum)
Occupational Therapy Evaluation Patient Details Name: [name] MRN: [id number] DOB: [birth year] Today's Date: 07/24/2016    History of Present Illness [name] is a 83 y.o. female with medical history significant of oxygen dependent chronic respiratory failure, CKD, initial fibrillation, chronic combined systolic and diastolic CHF, fibromyalgia, dementia, nonischemic cardiomyopathy, DVT/PE, symptomatic bradycardia status post pacemaker placement, CVA, OSA,  neuropathy, HTN and was admitted for SOB and dx with acute on chronic respiratory failure   Clinical Impression   This 83 yo female admitted with above presents to acute OT with deficits below (see OT problem list) thus affecting her PLOF of being able to do her basic ADLs at a Mod I level (W/C level). She will benefit from acute OT with follow up OT at SNF. Pt's sats on 2 liters was 91% at beginning of session and 95% at end of session    Follow Up Recommendations  SNF;Supervision/Assistance - 24 hour    Equipment Recommendations  None recommended by OT       Precautions / Restrictions Precautions Precautions: Fall Restrictions Weight Bearing Restrictions: No      Mobility Bed Mobility Overal bed mobility: Modified Independent Bed Mobility: Supine to Sit           General bed mobility comments: increased time, HOB elevated and use of bed rail--getting out on left side  Transfers Overall transfer level: Needs assistance Equipment used: 1 person hand held assist Transfers: Sit to/from [employer] Sit to Stand: Min assist Stand pivot transfers: Min assist            Balance Overall balance assessment: Needs assistance Sitting-balance support: Feet supported;No upper extremity supported Sitting balance-Leahy Scale: Fair     Standing balance support: Single extremity supported Standing balance-Leahy Scale: Poor                             ADL either performed or  assessed with clinical judgement   ADL Overall ADL's : Needs assistance/impaired Eating/Feeding: Independent;Sitting   Grooming: Set up;Sitting   Upper Body Bathing: Set up;Sitting   Lower Body Bathing: Moderate assistance Lower Body Bathing Details (indicate cue type and reason): min A sit<>stand Upper Body Dressing : Minimal assistance;Sitting   Lower Body Dressing: Moderate assistance Lower Body Dressing Details (indicate cue type and reason): min A sit<>stand Toilet Transfer: Minimal assistance;Stand-pivot;BSC   Toileting- Clothing Manipulation and Hygiene: Moderate assistance Toileting - Clothing Manipulation Details (indicate cue type and reason): min A sit<>stand             Vision Patient Visual Report: No change from baseline              Pertinent Vitals/Pain Pain Assessment: Faces Faces Pain Scale: Hurts even more Pain Location: low back and neck Pain Descriptors / Indicators: Aching;Grimacing;Sore Pain Intervention(s): Limited activity within patient's tolerance;Monitored during session;Patient requesting pain meds-RN notified;Heat applied     Hand Dominance Right   Extremity/Trunk Assessment Upper Extremity Assessment Upper Extremity Assessment: Generalized weakness   Lower Extremity Assessment Lower Extremity Assessment: Defer to PT evaluation       Communication Communication Communication: HOH   Cognition Arousal/Alertness: Awake/alert Behavior During Therapy: WFL for tasks assessed/performed Overall Cognitive Status: Within Functional Limits for tasks assessed  Home Living Family/patient expects to be discharged to:: Skilled nursing facility                                        Prior Functioning/Environment Level of Independence: Needs assistance  Gait / Transfers Assistance Needed: uses w/c as a usual mode of locomotion.  She walk with RW in  therapy. ADL's / Homemaking Assistance Needed: Pt states she normally does her own bathing and dressing from W/C level and toilets herself   Comments: Pt reports she primarily uses w/c at SNF because she is not allowed to amb on her own and staff have not been able to assist her with amb        OT Problem List: Decreased strength;Decreased range of motion;Decreased activity tolerance;Impaired balance (sitting and/or standing);Obesity;Pain;Cardiopulmonary status limiting activity      OT Treatment/Interventions: Self-care/ADL training;Therapeutic activities;Patient/family education;Balance training;DME and/or AE instruction    OT Goals(Current goals can be found in the care plan section) Acute Rehab OT Goals Patient Stated Goal: to be able to walk more OT Goal Formulation: With patient Time For Goal Achievement: 08/07/16 Potential to Achieve Goals: Good  OT Frequency: Min 2X/week              End of Session Nurse Communication: Mobility status (pt had a bowel movement); pt requesting back meds   Activity Tolerance: Patient limited by fatigue (DOE) Patient left: in chair;with call bell/phone within reach;with chair alarm set  OT Visit Diagnosis: Unsteadiness on feet (R26.81);Muscle weakness (generalized) (M62.81);Pain Pain - part of body:  (mid lower back and neck)                Time: 1610-9604 OT Time Calculation (min): 32 min Charges:  OT General Charges $OT Visit: 1 Procedure OT Evaluation $OT Eval Moderate Complexity: 1 Procedure OT Treatments $Self Care/Home Management : 8-22 mins  [name], OTR/L [phone] 07/24/2016

## 2016-07-24 NOTE — Consult Note (Signed)
   [facility] CM Inpatient Consult   07/24/2016  CYDNI CAPELLO 1933/01/20 [id number]  Patient was assessed for re-admission for possible [facility] Care Management needs.  Chart reviewed and the patient is 83 year-old female with history of oxygen dependent chronic respiratory failure, CKD stage III, atrial fibrillation, combined systolic and diastolic CHF, and nonischemic cardiomyopathy who was admitted to the hospital for worsening of shortness of breath. She was just discharged on 3/29, was noted to have signs of volume overload on chest x-ray and possible underlying pneumonia as well. She was given IV Lasix which significantly improved her breathing as she diuresed well but she still continued to have productive cough. Pro-calcitonin levels were elevated therefore she was started on Levaquin for possible pneumonia.  Patient is a long-term resident at [facility] in which the patient has 24 hour care and no community care management needs noted from [facility] Care Management.  For questions, please contact:  Charlesetta Shanks, RN BSN CCM [facility]  [phone] business mobile phone Toll free office [phone]

## 2016-07-25 LAB — CBC
HCT: 30.3 % — ABNORMAL LOW (ref 36.0–46.0)
Hemoglobin: 9.5 g/dL — ABNORMAL LOW (ref 12.0–15.0)
MCH: 30.1 pg (ref 26.0–34.0)
MCHC: 31.4 g/dL (ref 30.0–36.0)
MCV: 95.9 fL (ref 78.0–100.0)
PLATELETS: 191 10*3/uL (ref 150–400)
RBC: 3.16 MIL/uL — ABNORMAL LOW (ref 3.87–5.11)
RDW: 14.9 % (ref 11.5–15.5)
WBC: 6.5 10*3/uL (ref 4.0–10.5)

## 2016-07-25 LAB — BASIC METABOLIC PANEL
Anion gap: 13 (ref 5–15)
BUN: 28 mg/dL — AB (ref 6–20)
CALCIUM: 8 mg/dL — AB (ref 8.9–10.3)
CO2: 30 mmol/L (ref 22–32)
CREATININE: 2.04 mg/dL — AB (ref 0.44–1.00)
Chloride: 95 mmol/L — ABNORMAL LOW (ref 101–111)
GFR calc Af Amer: 25 mL/min — ABNORMAL LOW (ref >60)
GFR, EST NON AFRICAN AMERICAN: 22 mL/min — AB (ref >60)
Glucose, Bld: 87 mg/dL (ref 65–99)
POTASSIUM: 4 mmol/L (ref 3.5–5.1)
SODIUM: 138 mmol/L (ref 135–145)

## 2016-07-25 LAB — PROTIME-INR
INR: 2.87
PROTHROMBIN TIME: 30.6 s — AB (ref 11.4–15.2)

## 2016-07-25 MED ORDER — LEVOFLOXACIN 500 MG PO TABS
500.0000 mg | ORAL_TABLET | ORAL | Status: AC
  Administered 2016-07-25 – 2016-07-27 (×2): 500 mg via ORAL
  Filled 2016-07-25 (×3): qty 1

## 2016-07-25 MED ORDER — WARFARIN SODIUM 2 MG PO TABS: 2.0000 mg | ORAL_TABLET | Freq: Every day | ORAL | Status: DC

## 2016-07-25 MED ORDER — LINACLOTIDE 145 MCG PO CAPS
145.0000 ug | ORAL_CAPSULE | Freq: Every day | ORAL | Status: DC
  Administered 2016-07-25 – 2016-07-30 (×6): 145 ug via ORAL
  Filled 2016-07-25 (×6): qty 1

## 2016-07-25 NOTE — Progress Notes (Signed)
Pt fatigued following PT, agreeable only to seated grooming at EOB. Pt also complaining of neck pain. Will continue to follow with plan for return to SNF.   07/25/16 1500  OT Visit Information  Last OT Received On 07/25/16  Assistance Needed +1  History of Present Illness [name] is a 83 y.o. female with medical history significant of oxygen dependent chronic respiratory failure, CKD, initial fibrillation, chronic combined systolic and diastolic CHF, fibromyalgia, dementia, nonischemic cardiomyopathy, DVT/PE, symptomatic bradycardia status post pacemaker placement, CVA, OSA,  neuropathy, HTN and was admitted for SOB and dx with acute on chronic respiratory failure  Precautions  Precautions Fall  Pain Assessment  Pain Assessment 0-10  Pain Score 6  Pain Location neck  Pain Descriptors / Indicators Aching;Grimacing;Sore;Guarding  Pain Intervention(s) Repositioned;Monitored during session  Cognition  Arousal/Alertness Awake/alert  Behavior During Therapy WFL for tasks assessed/performed  Overall Cognitive Status Within Functional Limits for tasks assessed  ADL  Overall ADL's  Needs assistance/impaired  Grooming Wash/dry hands;Wash/dry face;Brushing hair;Sitting;Set up  General ADL Comments Pt only agreeable to grooming at EOB, reports pain in neck and had just finished PT.  Bed Mobility  Overal bed mobility Needs Assistance  Bed Mobility Supine to Sit;Sit to Supine  Supine to sit Min guard  Sit to supine Supervision  General bed mobility comments no physical assist  Balance  Sitting-balance support Feet supported;No upper extremity supported  Sitting balance-Leahy Scale Fair  Restrictions  Weight Bearing Restrictions No  OT - End of Session  Activity Tolerance Patient limited by fatigue  Patient left in bed;with call bell/phone within reach  OT Assessment/Plan  OT Plan Discharge plan remains appropriate  OT Visit Diagnosis Unsteadiness on feet (R26.81);Muscle weakness  (generalized) (M62.81);Pain  Pain - part of body (neck)  OT Frequency (ACUTE ONLY) Min 2X/week  Follow Up Recommendations SNF;Supervision/Assistance - 24 hour  OT Equipment None recommended by OT  AM-PAC OT "6 Clicks" Daily Activity Outcome Measure  Help from another person eating meals? 4  Help from another person taking care of personal grooming? 3  Help from another person toileting, which includes using toliet, bedpan, or urinal? 2  Help from another person bathing (including washing, rinsing, drying)? 2  Help from another person to put on and taking off regular upper body clothing? 3  Help from another person to put on and taking off regular lower body clothing? 2  6 Click Score 16  ADL G Code Conversion CK  OT Goal Progression  Progress towards OT goals Not progressing toward goals - comment (pain, fatigue)  Acute Rehab OT Goals  Patient Stated Goal to be able to walk more  Time For Goal Achievement 08/07/16  Potential to Achieve Goals Good  OT Time Calculation  OT Start Time (ACUTE ONLY) 1440  OT Stop Time (ACUTE ONLY) 1451  OT Time Calculation (min) 11 min  OT General Charges  $OT Visit 1 Procedure  OT Treatments  $Self Care/Home Management  8-22 mins  07/25/2016 Martie Round, OTR/L Pager: [phone]

## 2016-07-25 NOTE — Progress Notes (Signed)
ANTICOAGULATION CONSULT NOTE  Pharmacy Consult for warfarin Indication: atrial fibrillation  Assessment: 31 yof with hx of afib on warfarin PTA. Also with hx DVT/PE noted. Pharmacy consulted to dose while inpatient.  INR now trending up quickly from increased doses / ? Interaction with levofloxacin  PTA warfarin dose:  1.5mg  daily (last dose 07/20/16 PTA)  Goal of Therapy:  INR 2-3 Monitor platelets by anticoagulation protocol: Yes   Plan:  Hold Warfarin today Warfarin 2 mg po daily starting 4/7 Daily INR Monitor CBC, s/sx bleeding  Thank you Okey Regal, PharmD [phone] 07/25/2016 8:54 AM

## 2016-07-25 NOTE — Progress Notes (Signed)
qPhysical Therapy Treatment Patient Details Name: LASONDA UMBARGER MRN: [id number] DOB: 22-Oct-1932 Today's Date: 07/25/2016    History of Present Illness ZIYANA SASS is a 83 y.o. female with medical history significant of oxygen dependent chronic respiratory failure, CKD, initial fibrillation, chronic combined systolic and diastolic CHF, fibromyalgia, dementia, nonischemic cardiomyopathy, DVT/PE, symptomatic bradycardia status post pacemaker placement, CVA, OSA,  neuropathy, HTN and was admitted for SOB and dx with acute on chronic respiratory failure    PT Comments    Patient reporting increased neck pain x2 days, initially declined therapy, but agreed after encouragement.  MIN assist to MIN guard assist overall for room level functional mobility, limited by neck pain.  Instructed patient in ROM exercises to decrease pain, with Fair return demonstration.  O2 saturation maintained during treatment on supplemental O2.  Patient will continue to benefit from skilled PT services.   Follow Up Recommendations  SNF     Equipment Recommendations  None recommended by PT    Recommendations for Other Services       Precautions / Restrictions Precautions Precautions: Fall Restrictions Weight Bearing Restrictions: No    Mobility  Bed Mobility Overal bed mobility: Needs Assistance Bed Mobility: Supine to Sit;Sit to Supine     Supine to sit: Min guard Sit to supine: Supervision   General bed mobility comments: Assist due to neck pain.  Transfers Overall transfer level: Needs assistance Equipment used: Rolling walker (2 wheeled) Transfers: Sit to/from Stand Sit to Stand: Min assist Stand pivot transfers: Min assist       General transfer comment: increased time, VC'ing for bilateral hand placement and min guard for safety  Ambulation/Gait Ambulation/Gait assistance: Min guard Ambulation Distance (Feet): 15 Feet Assistive device: Rolling walker (2 wheeled) Gait  Pattern/deviations: Step-through pattern;Trunk flexed;Decreased stride length Gait velocity: decreased       Stairs            Wheelchair Mobility    Modified Rankin (Stroke Patients Only)       Balance Overall balance assessment: Needs assistance Sitting-balance support: Feet supported;No upper extremity supported Sitting balance-Leahy Scale: Fair     Standing balance support: During functional activity;Bilateral upper extremity supported Standing balance-Leahy Scale: Poor Standing balance comment: pt reliant on bilateral UE on RW                            Cognition Arousal/Alertness: Awake/alert Behavior During Therapy: WFL for tasks assessed/performed Overall Cognitive Status: Within Functional Limits for tasks assessed                                        Exercises General Exercises - Lower Extremity Hip Flexion/Marching: AROM;Both;10 reps;Standing Other Exercises Other Exercises: C-spine rotation, pain reduced ROM Other Exercises: C-spine flex/ext, pain reduced ROM Other Exercises: Shoulder shrugs, pain reduced ROM Other Exercises: Scapular retraction, pain reduced ROM    General Comments General comments (skin integrity, edema, etc.): O2 saturation maintained during treatment on 2L      Pertinent Vitals/Pain Pain Assessment: 0-10 Pain Score: 6  Pain Location: neck Pain Descriptors / Indicators: Aching;Grimacing;Sore;Guarding Pain Intervention(s): Limited activity within patient's tolerance;Monitored during session;Repositioned;Relaxation    Home Living Family/patient expects to be discharged to:: Skilled nursing facility               Additional Comments: [facility]    Prior Function  Level of Independence: Needs assistance  Gait / Transfers Assistance Needed: pt reported that she doesn't get to ambulate much at her SNF ADL's / Homemaking Assistance Needed: Pt states she normally does her own bathing and  dressing from W/C level and toilets herself Comments: Pt reports she primarily uses w/c at SNF because she is not allowed to amb on her own and staff have not been able to assist her with amb   PT Goals (current goals can now be found in the care plan section) Acute Rehab PT Goals Patient Stated Goal: to be able to walk more PT Goal Formulation: With patient Time For Goal Achievement: 08/07/16 Potential to Achieve Goals: Good Progress towards PT goals: Progressing toward goals    Frequency    Min 2X/week      PT Plan Current plan remains appropriate    Co-evaluation             End of Session Equipment Utilized During Treatment: Gait belt Activity Tolerance: Patient tolerated treatment well Patient left: in bed;with call bell/phone within reach;with bed alarm set Nurse Communication: Mobility status PT Visit Diagnosis: Unsteadiness on feet (R26.81);Muscle weakness (generalized) (M62.81)     Time: 1405-1440 PT Time Calculation (min) (ACUTE ONLY): 35 min  Charges:  $Therapeutic Exercise: 8-22 mins $Therapeutic Activity: 8-22 mins                    G Codes:  Functional Assessment Tool Used: Clinical judgement;AM-PAC 6 Clicks Basic Mobility Functional Limitation: Mobility: Walking and moving around Mobility: Walking and Moving Around Current Status ([id number]): At least 1 percent but less than 20 percent impaired, limited or restricted Mobility: Walking and Moving Around Goal Status [phone]): At least 1 percent but less than 20 percent impaired, limited or restricted    Janan Halter, DPT   Freida Busman, Krithik Mapel L 07/25/2016, 2:51 PM

## 2016-07-25 NOTE — Care Management Important Message (Signed)
Important Message  Patient Details  Name: [name] MRN: [id number] Date of Birth: Aug 04, 1932   Medicare Important Message Given:  Yes    [name] [name] 07/25/2016, 2:53 PM

## 2016-07-25 NOTE — Progress Notes (Signed)
[facility] TEAM 1 - Stepdown/ICU TEAM  [name]  ZJQ:[id number] DOB: [birth year] DOA: 07/21/2016 PCP: Oneal Grout, MD    Brief Narrative:  83 year-old female with history of oxygen dependent chronic respiratory failure, CKD stage III, atrial fibrillation, combined systolic and diastolic CHF, and nonischemic cardiomyopathy who was admitted to the hospital for worsening of shortness of breath. She was just discharged on 3/29, was noted to have signs of volume overload on chest x-ray and possible underlying pneumonia as well. She was given IV Lasix which significantly improved her breathing as she diuresed well but she still continued to have productive cough. Pro-calcitonin levels were elevated therefore she was started on Levaquin for possible pneumonia.  Subjective: Breathing is improving, not back to baseline yet -dizzy with activity  Assessment & Plan:  Acute on chronic hypoxic respiratory failure -fluid overload in the setting of CHF exacerbation + PNA  -see below  Pneumonia - possible aspiration  continue Levaquin Day 5/7  Acute on chronic systolic and diastolic congestive heart failure  EF 35-40% w/ grade 2 DD via TTE Feb 2018  -improved with diuresis, negative 1.5L, creatinine bumped to 2 this am, will hold diuretics and monitor -was discharged off diuretics last admit due to AKI/dehydration -resume torsemide tomorrow if stable -monitor weight closely -complicated by CKD 3-4  Chronic atrial fibrillation -Continue amiodarone BB and Coumadin - rate controlled - CHA2DS2-VASc is at least 5  -stop heparin/continue warfarin, INR >2  S/P TAVR 2014  Bradycardia status post pacemaker   Hypertension  -table, monitor  Chronic pain and neuropathy continue Lyrica and Cymbalta   Mild dementia continue Namenda and Aricept  Gout continue Uloric  Hyperlipidemia Continue statin  Hypothyroidism continue Synthroid  Chronic pain  DVT prophylaxis:  warfarin Code Status:  DO NOT INTUBATE  Family Communication: no family present at time of exam  Disposition Plan: SNF in 1-2days, depending on volume status, renal function  Consultants:  none  Procedures: none  Antimicrobials:  Levaquin 4/2 >  Objective: Blood pressure 125/61, pulse 66, temperature 98 F (36.7 C), temperature source Oral, resp. rate 18, height 4\' 10"  (1.473 m), weight 78.3 kg (172 lb 11.2 oz), SpO2 98 %.  Intake/Output Summary (Last 24 hours) at 07/25/16 1301 Last data filed at 07/25/16 0908  Gross per 24 hour  Intake              480 ml  Output             1350 ml  Net             -870 ml   Filed Weights   07/23/16 2136 07/24/16 0447 07/25/16 0525  Weight: 78.1 kg (172 lb 3.2 oz) 74.8 kg (165 lb) 78.3 kg (172 lb 11.2 oz)    Examination: General: No distress, AAOx3 Lungs: clear today Cardiovascular: Regular rate and rhythm without murmur gallop or rub normal S1 and S2 Abdomen: Nontender, nondistended, soft, bowel sounds positive, no rebound, no ascites, no appreciable mass Extremities: No significant cyanosis, clubbing, or edema bilateral lower extremities Neuro: non focal Skin: some bruises noted  CBC:  Recent Labs Lab 07/21/16 0627 07/22/16 0202 07/24/16 0216 07/25/16 0443  WBC 13.3* 12.2* 7.4 6.5  NEUTROABS 9.8*  --   --   --   HGB 12.4 9.4* 9.8* 9.5*  HCT 39.3 28.8* 30.8* 30.3*  MCV 95.6 95.0 94.5 95.9  PLT 305 175 175 191   Basic Metabolic Panel:  Recent Labs Lab 07/21/16 0627 07/22/16  0202 07/24/16 0216 07/25/16 0443  NA 144 143 143 138  K 3.5 4.0 3.3* 4.0  CL 107 104 98* 95*  CO2 22 29 34* 30  GLUCOSE 271* 87 101* 87  BUN 24* 27* 26* 28*  CREATININE 1.78* 1.82* 1.79* 2.04*  CALCIUM 9.8 9.0 8.7* 8.0*    Coagulation Profile:  Recent Labs Lab 07/21/16 1149 07/22/16 0202 07/23/16 0542 07/24/16 0216 07/25/16 0443  INR 1.72 1.74 1.67 2.13 2.87    HbA1C: Hgb A1c MFr Bld  Date/Time Value Ref Range Status   06/14/2016 02:44 AM 5.0 4.8 - 5.6 % Final    Comment:    (NOTE)         Pre-diabetes: 5.7 - 6.4         Diabetes: >6.4         Glycemic control for adults with diabetes: <7.0   12/15/2014 01:27 AM 5.5 4.8 - 5.6 % Final    Comment:    (NOTE)         Pre-diabetes: 5.7 - 6.4         Diabetes: >6.4         Glycemic control for adults with diabetes: <7.0     CBG:  Recent Labs Lab 07/21/16 0643  GLUCAP 259*    Recent Results (from the past 240 hour(s))  Blood Culture (routine x 2)     Status: None (Preliminary result)   Collection Time: 07/21/16  6:50 AM  Result Value Ref Range Status   Specimen Description LEFT ANTECUBITAL BOTH  Final   Special Requests 5CC  Final   Culture NO GROWTH 3 DAYS  Final   Report Status PENDING  Incomplete  Blood Culture (routine x 2)     Status: None (Preliminary result)   Collection Time: 07/21/16  7:57 AM  Result Value Ref Range Status   Specimen Description BLOOD RIGHT HAND ARPEDB  Final   Special Requests 2CC  Final   Culture NO GROWTH 3 DAYS  Final   Report Status PENDING  Incomplete  Respiratory Panel by PCR     Status: None   Collection Time: 07/21/16  4:02 PM  Result Value Ref Range Status   Adenovirus NOT DETECTED NOT DETECTED Final   Coronavirus 229E NOT DETECTED NOT DETECTED Final   Coronavirus HKU1 NOT DETECTED NOT DETECTED Final   Coronavirus NL63 NOT DETECTED NOT DETECTED Final   Coronavirus OC43 NOT DETECTED NOT DETECTED Final   Metapneumovirus NOT DETECTED NOT DETECTED Final   Rhinovirus / Enterovirus NOT DETECTED NOT DETECTED Final   Influenza A NOT DETECTED NOT DETECTED Final   Influenza A H1 NOT DETECTED NOT DETECTED Final   Influenza A H1 2009 NOT DETECTED NOT DETECTED Final   Influenza A H3 NOT DETECTED NOT DETECTED Final   Influenza B NOT DETECTED NOT DETECTED Final   Parainfluenza Virus 1 NOT DETECTED NOT DETECTED Final   Parainfluenza Virus 2 NOT DETECTED NOT DETECTED Final   Parainfluenza Virus 3 NOT DETECTED  NOT DETECTED Final   Parainfluenza Virus 4 NOT DETECTED NOT DETECTED Final   Respiratory Syncytial Virus NOT DETECTED NOT DETECTED Final   Bordetella pertussis NOT DETECTED NOT DETECTED Final   Chlamydophila pneumoniae NOT DETECTED NOT DETECTED Final   Mycoplasma pneumoniae NOT DETECTED NOT DETECTED Final     Scheduled Meds: . amiodarone  100 mg Oral Daily  . atorvastatin  10 mg Oral q1800  . carvedilol  6.25 mg Oral BID WC  . cycloSPORINE  1 drop  Both Eyes BID  . diclofenac sodium  2 g Topical TID AC & HS  . donepezil  10 mg Oral QHS  . DULoxetine  60 mg Oral q1800  . febuxostat  40 mg Oral q1800  . ferrous sulfate  325 mg Oral Daily  . hydrALAZINE  10 mg Oral TID  . ipratropium-albuterol  3 mL Nebulization TID  . levofloxacin  500 mg Oral Q48H  . levothyroxine  112 mcg Oral QAC breakfast  . linaclotide  145 mcg Oral QAC breakfast  . memantine  5 mg Oral BID  . pantoprazole  80 mg Oral Q1200  . [START ON 07/26/2016] warfarin  2 mg Oral q1800  . Warfarin - Pharmacist Dosing Inpatient   Does not apply q1800   Continuous Infusions:    LOS: 4 days   Zannie Cove, MD [facility] Office  [phone] Pager - Text Page per Amion as per below:  On-Call/Text Page:      [email or web address]      password [email or web address]  If 7PM-7AM, please contact night-coverage [email or web address] Password [email or web address] 07/25/2016, 1:01 PM

## 2016-07-26 LAB — BASIC METABOLIC PANEL
ANION GAP: 12 (ref 5–15)
BUN: 30 mg/dL — ABNORMAL HIGH (ref 6–20)
CALCIUM: 8.4 mg/dL — AB (ref 8.9–10.3)
CO2: 31 mmol/L (ref 22–32)
Chloride: 97 mmol/L — ABNORMAL LOW (ref 101–111)
Creatinine, Ser: 1.86 mg/dL — ABNORMAL HIGH (ref 0.44–1.00)
GFR, EST AFRICAN AMERICAN: 28 mL/min — AB (ref >60)
GFR, EST NON AFRICAN AMERICAN: 24 mL/min — AB (ref >60)
GLUCOSE: 86 mg/dL (ref 65–99)
Potassium: 3.9 mmol/L (ref 3.5–5.1)
SODIUM: 140 mmol/L (ref 135–145)

## 2016-07-26 LAB — BLOOD CULTURE ID PANEL (REFLEXED)
Acinetobacter baumannii: NOT DETECTED
CANDIDA PARAPSILOSIS: NOT DETECTED
CANDIDA TROPICALIS: NOT DETECTED
Candida albicans: NOT DETECTED
Candida glabrata: NOT DETECTED
Candida krusei: NOT DETECTED
ENTEROCOCCUS SPECIES: NOT DETECTED
Enterobacter cloacae complex: NOT DETECTED
Enterobacteriaceae species: NOT DETECTED
Escherichia coli: NOT DETECTED
HAEMOPHILUS INFLUENZAE: NOT DETECTED
KLEBSIELLA OXYTOCA: NOT DETECTED
Klebsiella pneumoniae: NOT DETECTED
LISTERIA MONOCYTOGENES: NOT DETECTED
Neisseria meningitidis: NOT DETECTED
Proteus species: NOT DETECTED
Pseudomonas aeruginosa: NOT DETECTED
SERRATIA MARCESCENS: NOT DETECTED
STAPHYLOCOCCUS AUREUS BCID: NOT DETECTED
STREPTOCOCCUS PYOGENES: NOT DETECTED
STREPTOCOCCUS SPECIES: NOT DETECTED
Staphylococcus species: NOT DETECTED
Streptococcus agalactiae: NOT DETECTED
Streptococcus pneumoniae: NOT DETECTED

## 2016-07-26 LAB — PROTIME-INR
INR: 2.92
PROTHROMBIN TIME: 31.1 s — AB (ref 11.4–15.2)

## 2016-07-26 LAB — CULTURE, BLOOD (ROUTINE X 2): Culture: NO GROWTH

## 2016-07-26 MED ORDER — WARFARIN SODIUM 2 MG PO TABS
2.0000 mg | ORAL_TABLET | Freq: Once | ORAL | Status: AC
  Administered 2016-07-26: 2 mg via ORAL
  Filled 2016-07-26: qty 1

## 2016-07-26 MED ORDER — POTASSIUM CHLORIDE CRYS ER 20 MEQ PO TBCR
20.0000 meq | EXTENDED_RELEASE_TABLET | Freq: Two times a day (BID) | ORAL | Status: DC
  Administered 2016-07-26 (×2): 20 meq via ORAL
  Filled 2016-07-26 (×2): qty 1

## 2016-07-26 MED ORDER — POTASSIUM CHLORIDE CRYS ER 20 MEQ PO TBCR: 40.0000 meq | EXTENDED_RELEASE_TABLET | Freq: Two times a day (BID) | ORAL | Status: DC

## 2016-07-26 MED ORDER — TORSEMIDE 20 MG PO TABS
20.0000 mg | ORAL_TABLET | Freq: Two times a day (BID) | ORAL | Status: DC
  Administered 2016-07-26 (×2): 20 mg via ORAL
  Filled 2016-07-26 (×2): qty 1

## 2016-07-26 MED ORDER — WARFARIN SODIUM 2 MG PO TABS: 2.0000 mg | ORAL_TABLET | Freq: Once | ORAL | Status: DC

## 2016-07-26 NOTE — Progress Notes (Signed)
[place] TEAM 1 - Stepdown/ICU TEAM  [name]  ZOX:[id number] DOB: 01/24/1933 DOA: 07/21/2016 PCP: Oneal Grout, MD    Brief Narrative:  83 year-old female with history of oxygen dependent chronic respiratory failure, CKD stage III, atrial fibrillation, combined systolic and diastolic CHF, and nonischemic cardiomyopathy who was admitted to the hospital for worsening of shortness of breath. She was just discharged on 3/29, was noted to have signs of volume overload on chest x-ray and possible underlying pneumonia as well. She was given IV Lasix which significantly improved her breathing as she diuresed well but she still continued to have productive cough. Pro-calcitonin levels were elevated therefore she was started on Levaquin for possible pneumonia.  Subjective: Breathing was improving but more dyspnea this am, not back to baseline yet Denies dizziness today  Assessment & Plan:  Acute on chronic hypoxic respiratory failure -fluid overload in the setting of CHF exacerbation + PNA  -see below  Pneumonia - possible aspiration  continue Levaquin Day 6/7, will stop Abx tomorrow  Acute on chronic systolic and diastolic congestive heart failure  EF 35-40% w/ grade 2 DD via TTE Feb 2018  -improved with diuresis, negative 1.8L, creatinine bumped to 2 yesterday and diuretics held -was discharged off diuretics last admit due to AKI/dehydration -resume torsemide today since creatinine down to 1.8 -volume status very challenging to manage with CHF/CKF and intermittent Orthostatic symptoms  Chronic atrial fibrillation -Continue amiodarone BB and Coumadin - rate controlled - CHA2DS2-VASc is at least 5  -stopped heparin, continue warfarin, INR >2  S/P TAVR 2014  Bradycardia status post pacemaker   Hypertension  -table, monitor  Chronic pain and neuropathy continue Lyrica and Cymbalta   Mild dementia continue Namenda and Aricept  Gout continue  Uloric  Hyperlipidemia Continue statin  Hypothyroidism continue Synthroid  Chronic pain  DVT prophylaxis: warfarin Code Status:  DO NOT INTUBATE  Family Communication: no family present at time of exam  Disposition Plan: SNF in 1-2days, depending on volume status, renal function  Consultants:  none  Procedures: none  Antimicrobials:  Levaquin 4/2 >  Objective: Blood pressure (!) 176/76, pulse 69, temperature 97.8 F (36.6 C), temperature source Oral, resp. rate 18, height 4\' 10"  (1.473 m), weight 78.4 kg (172 lb 12.8 oz), SpO2 96 %.  Intake/Output Summary (Last 24 hours) at 07/26/16 1101 Last data filed at 07/26/16 0955  Gross per 24 hour  Intake              360 ml  Output              851 ml  Net             -491 ml   Filed Weights   07/24/16 0447 07/25/16 0525 07/26/16 0608  Weight: 74.8 kg (165 lb) 78.3 kg (172 lb 11.2 oz) 78.4 kg (172 lb 12.8 oz)    Examination: General: No distress, AAOx3 Lungs: clear today Cardiovascular: Regular rate and rhythm without murmur gallop or rub normal S1 and S2 Abdomen: Nontender, nondistended, soft, bowel sounds positive, no rebound, no ascites, no appreciable mass Extremities: No significant cyanosis, clubbing, or edema bilateral lower extremities Neuro: non focal Skin: some bruises noted  CBC:  Recent Labs Lab 07/21/16 0627 07/22/16 0202 07/24/16 0216 07/25/16 0443  WBC 13.3* 12.2* 7.4 6.5  NEUTROABS 9.8*  --   --   --   HGB 12.4 9.4* 9.8* 9.5*  HCT 39.3 28.8* 30.8* 30.3*  MCV 95.6 95.0 94.5 95.9  PLT 305 175 175 191   Basic Metabolic Panel:  Recent Labs Lab 07/21/16 0627 07/22/16 0202 07/24/16 0216 07/25/16 0443 07/26/16 0417  NA 144 143 143 138 140  K 3.5 4.0 3.3* 4.0 3.9  CL 107 104 98* 95* 97*  CO2 22 29 34* 30 31  GLUCOSE 271* 87 101* 87 86  BUN 24* 27* 26* 28* 30*  CREATININE 1.78* 1.82* 1.79* 2.04* 1.86*  CALCIUM 9.8 9.0 8.7* 8.0* 8.4*    Coagulation Profile:  Recent Labs Lab  07/22/16 0202 07/23/16 0542 07/24/16 0216 07/25/16 0443 07/26/16 0417  INR 1.74 1.67 2.13 2.87 2.92    HbA1C: Hgb A1c MFr Bld  Date/Time Value Ref Range Status  06/14/2016 02:44 AM 5.0 4.8 - 5.6 % Final    Comment:    (NOTE)         Pre-diabetes: 5.7 - 6.4         Diabetes: >6.4         Glycemic control for adults with diabetes: <7.0   12/15/2014 01:27 AM 5.5 4.8 - 5.6 % Final    Comment:    (NOTE)         Pre-diabetes: 5.7 - 6.4         Diabetes: >6.4         Glycemic control for adults with diabetes: <7.0     CBG:  Recent Labs Lab 07/21/16 0643  GLUCAP 259*    Recent Results (from the past 240 hour(s))  Blood Culture (routine x 2)     Status: None (Preliminary result)   Collection Time: 07/21/16  6:50 AM  Result Value Ref Range Status   Specimen Description BLOOD LEFT ANTECUBITAL  Final   Special Requests BOTTLES DRAWN AEROBIC AND ANAEROBIC 5CC  Final   Culture  Setup Time   Final    ANAEROBIC BOTTLE ONLY GRAM POSITIVE RODS CRITICAL RESULT CALLED TO, READ BACK BY AND VERIFIED WITH: V BRYK,PHARMD AT 0712 07/26/16 BY L BENFIELD    Culture GRAM POSITIVE RODS  Final   Report Status PENDING  Incomplete  Blood Culture ID Panel (Reflexed)     Status: None   Collection Time: 07/21/16  6:50 AM  Result Value Ref Range Status   Enterococcus species NOT DETECTED NOT DETECTED Final   Listeria monocytogenes NOT DETECTED NOT DETECTED Final   Staphylococcus species NOT DETECTED NOT DETECTED Final   Staphylococcus aureus NOT DETECTED NOT DETECTED Final   Streptococcus species NOT DETECTED NOT DETECTED Final   Streptococcus agalactiae NOT DETECTED NOT DETECTED Final   Streptococcus pneumoniae NOT DETECTED NOT DETECTED Final   Streptococcus pyogenes NOT DETECTED NOT DETECTED Final   Acinetobacter baumannii NOT DETECTED NOT DETECTED Final   Enterobacteriaceae species NOT DETECTED NOT DETECTED Final   Enterobacter cloacae complex NOT DETECTED NOT DETECTED Final   Escherichia  coli NOT DETECTED NOT DETECTED Final   Klebsiella oxytoca NOT DETECTED NOT DETECTED Final   Klebsiella pneumoniae NOT DETECTED NOT DETECTED Final   Proteus species NOT DETECTED NOT DETECTED Final   Serratia marcescens NOT DETECTED NOT DETECTED Final   Haemophilus influenzae NOT DETECTED NOT DETECTED Final   Neisseria meningitidis NOT DETECTED NOT DETECTED Final   Pseudomonas aeruginosa NOT DETECTED NOT DETECTED Final   Candida albicans NOT DETECTED NOT DETECTED Final   Candida glabrata NOT DETECTED NOT DETECTED Final   Candida krusei NOT DETECTED NOT DETECTED Final   Candida parapsilosis NOT DETECTED NOT DETECTED Final   Candida tropicalis NOT DETECTED NOT DETECTED  Final  Blood Culture (routine x 2)     Status: None (Preliminary result)   Collection Time: 07/21/16  7:57 AM  Result Value Ref Range Status   Specimen Description BLOOD RIGHT HAND ARPEDB  Final   Special Requests 2CC  Final   Culture NO GROWTH 4 DAYS  Final   Report Status PENDING  Incomplete  Respiratory Panel by PCR     Status: None   Collection Time: 07/21/16  4:02 PM  Result Value Ref Range Status   Adenovirus NOT DETECTED NOT DETECTED Final   Coronavirus 229E NOT DETECTED NOT DETECTED Final   Coronavirus HKU1 NOT DETECTED NOT DETECTED Final   Coronavirus NL63 NOT DETECTED NOT DETECTED Final   Coronavirus OC43 NOT DETECTED NOT DETECTED Final   Metapneumovirus NOT DETECTED NOT DETECTED Final   Rhinovirus / Enterovirus NOT DETECTED NOT DETECTED Final   Influenza A NOT DETECTED NOT DETECTED Final   Influenza A H1 NOT DETECTED NOT DETECTED Final   Influenza A H1 2009 NOT DETECTED NOT DETECTED Final   Influenza A H3 NOT DETECTED NOT DETECTED Final   Influenza B NOT DETECTED NOT DETECTED Final   Parainfluenza Virus 1 NOT DETECTED NOT DETECTED Final   Parainfluenza Virus 2 NOT DETECTED NOT DETECTED Final   Parainfluenza Virus 3 NOT DETECTED NOT DETECTED Final   Parainfluenza Virus 4 NOT DETECTED NOT DETECTED Final    Respiratory Syncytial Virus NOT DETECTED NOT DETECTED Final   Bordetella pertussis NOT DETECTED NOT DETECTED Final   Chlamydophila pneumoniae NOT DETECTED NOT DETECTED Final   Mycoplasma pneumoniae NOT DETECTED NOT DETECTED Final     Scheduled Meds: . amiodarone  100 mg Oral Daily  . atorvastatin  10 mg Oral q1800  . carvedilol  6.25 mg Oral BID WC  . cycloSPORINE  1 drop Both Eyes BID  . diclofenac sodium  2 g Topical TID AC & HS  . donepezil  10 mg Oral QHS  . DULoxetine  60 mg Oral q1800  . febuxostat  40 mg Oral q1800  . ferrous sulfate  325 mg Oral Daily  . hydrALAZINE  10 mg Oral TID  . ipratropium-albuterol  3 mL Nebulization TID  . levofloxacin  500 mg Oral Q48H  . levothyroxine  112 mcg Oral QAC breakfast  . linaclotide  145 mcg Oral QAC breakfast  . memantine  5 mg Oral BID  . pantoprazole  80 mg Oral Q1200  . potassium chloride  20 mEq Oral BID  . torsemide  20 mg Oral BID  . warfarin  2 mg Oral ONCE-1800  . Warfarin - Pharmacist Dosing Inpatient   Does not apply q1800   Continuous Infusions:    LOS: 5 days   Zannie Cove, MD [facility] Office  [phone] Pager - Text Page per Amion as per below:  On-Call/Text Page:      [email or web address]      password [email or web address]  If 7PM-7AM, please contact night-coverage [email or web address] Password [email or web address] 07/26/2016, 11:01 AM

## 2016-07-26 NOTE — Progress Notes (Signed)
ANTICOAGULATION CONSULT NOTE  Pharmacy Consult for warfarin Indication: atrial fibrillation   Allergies  Allergen Reactions  . Nubain [Nalbuphine Hcl] Hives    Went into cardiac arrest   . Codeine Hives and Nausea Only    Has tolerated Oxycodone  . Darvocet [Propoxyphene N-Acetaminophen] Nausea Only    Patient Measurements: Height: 4\' 10"  (147.3 cm) Weight: 172 lb 12.8 oz (78.4 kg) IBW/kg (Calculated) : 40.9  Vital Signs: Temp: 97.8 F (36.6 C) (04/07 0608) Temp Source: Oral (04/07 0608) BP: 176/76 (04/07 3419) Pulse Rate: 69 (04/07 0608)  Labs:  Recent Labs  07/24/16 0216 07/25/16 0443 07/26/16 0417  HGB 9.8* 9.5*  --   HCT 30.8* 30.3*  --   PLT 175 191  --   LABPROT 24.2* 30.6* 31.1*  INR 2.13 2.87 2.92  HEPARINUNFRC 0.42  --   --   CREATININE 1.79* 2.04* 1.86*    Estimated Creatinine Clearance: 20.6 mL/min (A) (by C-G formula based on SCr of 1.86 mg/dL (H)).   Assessment: 40 yof with hx of afib on warfarin PTA. Also with hx DVT/PE noted. Pharmacy consulted to dose warfarin while inpatient. INR noted to be trending up quickly. May be due to short-term increased dose of warfarin or from drug interaction with levofloxacin. Single dose held on 4/6. Resuming warfarin today. CBC stable, no s/s bleeding noted.   PTA warfarin dose:  1.5mg  daily (last dose 07/20/16 PTA)  Goal of Therapy:  INR 2-3 Monitor platelets by anticoagulation protocol: Yes   Plan:  Warfarin 2 mg x1 tonight  Daily INR for now Monitor CBC, s/sx bleeding Watch drug interactions with warfarin    York Cerise, PharmD Pharmacy Resident  Pager [phone] 07/26/16 10:26 AM

## 2016-07-26 NOTE — Progress Notes (Signed)
PHARMACY - PHYSICIAN COMMUNICATION CRITICAL VALUE ALERT - BLOOD CULTURE IDENTIFICATION (BCID)  Results for orders placed or performed during the hospital encounter of 07/21/16  Blood Culture ID Panel (Reflexed) (Collected: 07/21/2016  6:50 AM)  Result Value Ref Range   Enterococcus species NOT DETECTED NOT DETECTED   Listeria monocytogenes NOT DETECTED NOT DETECTED   Staphylococcus species NOT DETECTED NOT DETECTED   Staphylococcus aureus NOT DETECTED NOT DETECTED   Streptococcus species NOT DETECTED NOT DETECTED   Streptococcus agalactiae NOT DETECTED NOT DETECTED   Streptococcus pneumoniae NOT DETECTED NOT DETECTED   Streptococcus pyogenes NOT DETECTED NOT DETECTED   Acinetobacter baumannii NOT DETECTED NOT DETECTED   Enterobacteriaceae species NOT DETECTED NOT DETECTED   Enterobacter cloacae complex NOT DETECTED NOT DETECTED   Escherichia coli NOT DETECTED NOT DETECTED   Klebsiella oxytoca NOT DETECTED NOT DETECTED   Klebsiella pneumoniae NOT DETECTED NOT DETECTED   Proteus species NOT DETECTED NOT DETECTED   Serratia marcescens NOT DETECTED NOT DETECTED   Haemophilus influenzae NOT DETECTED NOT DETECTED   Neisseria meningitidis NOT DETECTED NOT DETECTED   Pseudomonas aeruginosa NOT DETECTED NOT DETECTED   Candida albicans NOT DETECTED NOT DETECTED   Candida glabrata NOT DETECTED NOT DETECTED   Candida krusei NOT DETECTED NOT DETECTED   Candida parapsilosis NOT DETECTED NOT DETECTED   Candida tropicalis NOT DETECTED NOT DETECTED    Name of physician (or Provider) Contacted: P.Joseph  Changes to prescribed antibiotics required: No ID yet, on Levaquin, continue to follow for further micro data.  Vernard Gambles, PharmD, BCPS  07/26/2016  7:31 AM

## 2016-07-27 LAB — BASIC METABOLIC PANEL
Anion gap: 7 (ref 5–15)
BUN: 41 mg/dL — ABNORMAL HIGH (ref 6–20)
CHLORIDE: 101 mmol/L (ref 101–111)
CO2: 31 mmol/L (ref 22–32)
CREATININE: 2.59 mg/dL — AB (ref 0.44–1.00)
Calcium: 8.9 mg/dL (ref 8.9–10.3)
GFR calc non Af Amer: 16 mL/min — ABNORMAL LOW (ref >60)
GFR, EST AFRICAN AMERICAN: 19 mL/min — AB (ref >60)
Glucose, Bld: 102 mg/dL — ABNORMAL HIGH (ref 65–99)
POTASSIUM: 4.5 mmol/L (ref 3.5–5.1)
Sodium: 139 mmol/L (ref 135–145)

## 2016-07-27 LAB — PROTIME-INR
INR: 2.2
Prothrombin Time: 24.8 seconds — ABNORMAL HIGH (ref 11.4–15.2)

## 2016-07-27 MED ORDER — WARFARIN SODIUM 2 MG PO TABS
2.0000 mg | ORAL_TABLET | Freq: Once | ORAL | Status: AC
  Administered 2016-07-27: 2 mg via ORAL
  Filled 2016-07-27: qty 1

## 2016-07-27 MED ORDER — MUSCLE RUB 10-15 % EX CREA
TOPICAL_CREAM | CUTANEOUS | Status: DC | PRN
  Administered 2016-07-27 – 2016-07-28 (×3): 1 via TOPICAL
  Filled 2016-07-27 (×2): qty 85

## 2016-07-27 NOTE — Progress Notes (Signed)
ANTICOAGULATION CONSULT NOTE  Pharmacy Consult for warfarin Indication: atrial fibrillation   Allergies  Allergen Reactions  . Nubain [Nalbuphine Hcl] Hives    Went into cardiac arrest   . Codeine Hives and Nausea Only    Has tolerated Oxycodone  . Darvocet [Propoxyphene N-Acetaminophen] Nausea Only    Patient Measurements: Height: 4\' 10"  (147.3 cm) Weight: 171 lb 3.2 oz (77.7 kg) IBW/kg (Calculated) : 40.9  Vital Signs: Temp: 98.3 F (36.8 C) (04/08 0500) Temp Source: Oral (04/08 0500) BP: 143/76 (04/08 0500) Pulse Rate: 77 (04/08 0500)  Labs:  Recent Labs  07/25/16 0443 07/26/16 0417 07/27/16 0218  HGB 9.5*  --   --   HCT 30.3*  --   --   PLT 191  --   --   LABPROT 30.6* 31.1* 24.8*  INR 2.87 2.92 2.20  CREATININE 2.04* 1.86* 2.59*    Estimated Creatinine Clearance: 14.7 mL/min (A) (by C-G formula based on SCr of 2.59 mg/dL (H)).   Assessment: 51 yof with hx of afib on warfarin PTA. Also with hx DVT/PE noted. Pharmacy consulted to dose warfarin while inpatient. INR noted to trend up quickly, likely due to short-term increased dose of warfarin or drug interaction with levofloxacin. Single dose of warfarin held on 4/6. INR today therapeutic at 2.2, but down from previous. CBC from 4/6 stable, no s/s bleeding noted.   PTA warfarin dose: 1.5mg  daily (last dose 07/20/16 PTA)  Goal of Therapy:  INR 2-3 Monitor platelets by anticoagulation protocol: Yes   Plan:  Warfarin 2 mg x1 tonight  Daily INR  Monitor CBC PRN, s/sx bleeding Watch drug interactions with warfarin   York Cerise, PharmD Pharmacy Resident  Pager [phone] 07/27/16 8:35 AM

## 2016-07-27 NOTE — Progress Notes (Signed)
[place] TEAM 1 - Stepdown/ICU TEAM  KYRI SHADER  ZOX:[id number] DOB: 1933-03-14 DOA: 07/21/2016 PCP: Oneal Grout, MD    Brief Narrative:  83 year-old female with history of oxygen dependent chronic respiratory failure, CKD stage III, atrial fibrillation, combined systolic and diastolic CHF, and nonischemic cardiomyopathy who was admitted to the hospital for worsening of shortness of breath. She was just discharged on 3/29, was noted to have signs of volume overload on chest x-ray and possible underlying pneumonia as well. She was given IV Lasix which significantly improved her breathing as she diuresed well but she still continued to have productive cough. Pro-calcitonin levels were elevated therefore she was started on Levaquin for possible pneumonia.  Subjective: Feels very weak, some dizziness  Assessment & Plan:  Acute on chronic hypoxic respiratory failure -fluid overload in the setting of CHF exacerbation + PNA  -see below  Pneumonia - possible aspiration  -on Levaquin Day 7, will stop Abx today  Acute on chronic systolic and diastolic congestive heart failure  EF 35-40% w/ grade 2 DD via TTE Feb 2018  -improved with diuresis, negative 4.4L, creatinine bumped to 2.5 today, will hold torsemide -was discharged off diuretics last admit due to AKI/dehydration -volume status very challenging to manage with CHF/CKF and intermittent Orthostatic symptoms  Chronic atrial fibrillation -Continue amiodarone BB and Coumadin - rate controlled - CHA2DS2-VASc is at least 5  -stopped heparin, continue warfarin, INR >2  S/P TAVR 2014  Bradycardia status post pacemaker   Hypertension  -table, monitor  Chronic pain and neuropathy continue Lyrica and Cymbalta   Mild dementia continue Namenda and Aricept  Gout continue Uloric  Hyperlipidemia Continue statin  Hypothyroidism continue Synthroid  Chronic pain  DVT prophylaxis: warfarin Code Status:  DO NOT INTUBATE    Family Communication: no family present at time of exam  Disposition Plan: SNF in 2days, depending on volume status, renal function  Consultants:  none  Procedures: none  Antimicrobials:  Levaquin 4/2 >  Objective: Blood pressure (!) 149/57, pulse 63, temperature 98.6 F (37 C), temperature source Oral, resp. rate 18, height 4\' 10"  (1.473 m), weight 77.7 kg (171 lb 3.2 oz), SpO2 98 %.  Intake/Output Summary (Last 24 hours) at 07/27/16 1251 Last data filed at 07/27/16 1229  Gross per 24 hour  Intake              300 ml  Output             3050 ml  Net            -2750 ml   Filed Weights   07/25/16 0525 07/26/16 0608 07/27/16 0350  Weight: 78.3 kg (172 lb 11.2 oz) 78.4 kg (172 lb 12.8 oz) 77.7 kg (171 lb 3.2 oz)    Examination: General: No distress, AAOx3 Lungs: clear today Cardiovascular: Regular rate and rhythm without murmur gallop or rub normal S1 and S2 Abdomen: Nontender, nondistended, soft, bowel sounds positive, no rebound, no ascites, no appreciable mass Extremities: No significant cyanosis, clubbing, or edema bilateral lower extremities Neuro: non focal Skin: some bruises noted  CBC:  Recent Labs Lab 07/21/16 0627 07/22/16 0202 07/24/16 0216 07/25/16 0443  WBC 13.3* 12.2* 7.4 6.5  NEUTROABS 9.8*  --   --   --   HGB 12.4 9.4* 9.8* 9.5*  HCT 39.3 28.8* 30.8* 30.3*  MCV 95.6 95.0 94.5 95.9  PLT 305 175 175 191   Basic Metabolic Panel:  Recent Labs Lab 07/22/16 0202 07/24/16 0216  07/25/16 0443 07/26/16 0417 07/27/16 0218  NA 143 143 138 140 139  K 4.0 3.3* 4.0 3.9 4.5  CL 104 98* 95* 97* 101  CO2 29 34* 30 31 31   GLUCOSE 87 101* 87 86 102*  BUN 27* 26* 28* 30* 41*  CREATININE 1.82* 1.79* 2.04* 1.86* 2.59*  CALCIUM 9.0 8.7* 8.0* 8.4* 8.9    Coagulation Profile:  Recent Labs Lab 07/23/16 0542 07/24/16 0216 07/25/16 0443 07/26/16 0417 07/27/16 0218  INR 1.67 2.13 2.87 2.92 2.20    HbA1C: Hgb A1c MFr Bld  Date/Time Value Ref Range  Status  06/14/2016 02:44 AM 5.0 4.8 - 5.6 % Final    Comment:    (NOTE)         Pre-diabetes: 5.7 - 6.4         Diabetes: >6.4         Glycemic control for adults with diabetes: <7.0   12/15/2014 01:27 AM 5.5 4.8 - 5.6 % Final    Comment:    (NOTE)         Pre-diabetes: 5.7 - 6.4         Diabetes: >6.4         Glycemic control for adults with diabetes: <7.0     CBG:  Recent Labs Lab 07/21/16 0643  GLUCAP 259*    Recent Results (from the past 240 hour(s))  Blood Culture (routine x 2)     Status: None (Preliminary result)   Collection Time: 07/21/16  6:50 AM  Result Value Ref Range Status   Specimen Description BLOOD LEFT ANTECUBITAL  Final   Special Requests BOTTLES DRAWN AEROBIC AND ANAEROBIC 5CC  Final   Culture  Setup Time   Final    ANAEROBIC BOTTLE ONLY GRAM POSITIVE RODS CRITICAL RESULT CALLED TO, READ BACK BY AND VERIFIED WITH: V BRYK,PHARMD AT 0712 07/26/16 BY L BENFIELD    Culture   Final    GRAM POSITIVE RODS CULTURE REINCUBATED FOR BETTER GROWTH    Report Status PENDING  Incomplete  Blood Culture ID Panel (Reflexed)     Status: None   Collection Time: 07/21/16  6:50 AM  Result Value Ref Range Status   Enterococcus species NOT DETECTED NOT DETECTED Final   Listeria monocytogenes NOT DETECTED NOT DETECTED Final   Staphylococcus species NOT DETECTED NOT DETECTED Final   Staphylococcus aureus NOT DETECTED NOT DETECTED Final   Streptococcus species NOT DETECTED NOT DETECTED Final   Streptococcus agalactiae NOT DETECTED NOT DETECTED Final   Streptococcus pneumoniae NOT DETECTED NOT DETECTED Final   Streptococcus pyogenes NOT DETECTED NOT DETECTED Final   Acinetobacter baumannii NOT DETECTED NOT DETECTED Final   Enterobacteriaceae species NOT DETECTED NOT DETECTED Final   Enterobacter cloacae complex NOT DETECTED NOT DETECTED Final   Escherichia coli NOT DETECTED NOT DETECTED Final   Klebsiella oxytoca NOT DETECTED NOT DETECTED Final   Klebsiella pneumoniae  NOT DETECTED NOT DETECTED Final   Proteus species NOT DETECTED NOT DETECTED Final   Serratia marcescens NOT DETECTED NOT DETECTED Final   Haemophilus influenzae NOT DETECTED NOT DETECTED Final   Neisseria meningitidis NOT DETECTED NOT DETECTED Final   Pseudomonas aeruginosa NOT DETECTED NOT DETECTED Final   Candida albicans NOT DETECTED NOT DETECTED Final   Candida glabrata NOT DETECTED NOT DETECTED Final   Candida krusei NOT DETECTED NOT DETECTED Final   Candida parapsilosis NOT DETECTED NOT DETECTED Final   Candida tropicalis NOT DETECTED NOT DETECTED Final  Blood Culture (routine x 2)  Status: None   Collection Time: 07/21/16  7:57 AM  Result Value Ref Range Status   Specimen Description BLOOD RIGHT HAND ARPEDB  Final   Special Requests 2CC  Final   Culture NO GROWTH 5 DAYS  Final   Report Status 07/26/2016 FINAL  Final  Respiratory Panel by PCR     Status: None   Collection Time: 07/21/16  4:02 PM  Result Value Ref Range Status   Adenovirus NOT DETECTED NOT DETECTED Final   Coronavirus 229E NOT DETECTED NOT DETECTED Final   Coronavirus HKU1 NOT DETECTED NOT DETECTED Final   Coronavirus NL63 NOT DETECTED NOT DETECTED Final   Coronavirus OC43 NOT DETECTED NOT DETECTED Final   Metapneumovirus NOT DETECTED NOT DETECTED Final   Rhinovirus / Enterovirus NOT DETECTED NOT DETECTED Final   Influenza A NOT DETECTED NOT DETECTED Final   Influenza A H1 NOT DETECTED NOT DETECTED Final   Influenza A H1 2009 NOT DETECTED NOT DETECTED Final   Influenza A H3 NOT DETECTED NOT DETECTED Final   Influenza B NOT DETECTED NOT DETECTED Final   Parainfluenza Virus 1 NOT DETECTED NOT DETECTED Final   Parainfluenza Virus 2 NOT DETECTED NOT DETECTED Final   Parainfluenza Virus 3 NOT DETECTED NOT DETECTED Final   Parainfluenza Virus 4 NOT DETECTED NOT DETECTED Final   Respiratory Syncytial Virus NOT DETECTED NOT DETECTED Final   Bordetella pertussis NOT DETECTED NOT DETECTED Final   Chlamydophila  pneumoniae NOT DETECTED NOT DETECTED Final   Mycoplasma pneumoniae NOT DETECTED NOT DETECTED Final     Scheduled Meds: . amiodarone  100 mg Oral Daily  . atorvastatin  10 mg Oral q1800  . carvedilol  6.25 mg Oral BID WC  . cycloSPORINE  1 drop Both Eyes BID  . diclofenac sodium  2 g Topical TID AC & HS  . donepezil  10 mg Oral QHS  . DULoxetine  60 mg Oral q1800  . febuxostat  40 mg Oral q1800  . ferrous sulfate  325 mg Oral Daily  . hydrALAZINE  10 mg Oral TID  . ipratropium-albuterol  3 mL Nebulization TID  . levothyroxine  112 mcg Oral QAC breakfast  . linaclotide  145 mcg Oral QAC breakfast  . memantine  5 mg Oral BID  . pantoprazole  80 mg Oral Q1200  . warfarin  2 mg Oral ONCE-1800  . Warfarin - Pharmacist Dosing Inpatient   Does not apply q1800   Continuous Infusions:    LOS: 6 days   Zannie Cove, MD [facility] Office  [phone] Pager - Text Page per Amion as per below:  On-Call/Text Page:      [email or web address]      password [email or web address]  If 7PM-7AM, please contact night-coverage [email or web address] Password [facility] 07/27/2016, 12:51 PM

## 2016-07-28 ENCOUNTER — Other Ambulatory Visit: Payer: Self-pay

## 2016-07-28 LAB — BASIC METABOLIC PANEL
Anion gap: 13 (ref 5–15)
BUN: 41 mg/dL — ABNORMAL HIGH (ref 6–20)
CO2: 28 mmol/L (ref 22–32)
CREATININE: 2.2 mg/dL — AB (ref 0.44–1.00)
Calcium: 9.1 mg/dL (ref 8.9–10.3)
Chloride: 97 mmol/L — ABNORMAL LOW (ref 101–111)
GFR calc Af Amer: 23 mL/min — ABNORMAL LOW (ref >60)
GFR calc non Af Amer: 20 mL/min — ABNORMAL LOW (ref >60)
GLUCOSE: 93 mg/dL (ref 65–99)
Potassium: 4.1 mmol/L (ref 3.5–5.1)
Sodium: 138 mmol/L (ref 135–145)

## 2016-07-28 LAB — CULTURE, BLOOD (ROUTINE X 2)

## 2016-07-28 LAB — GLUCOSE, CAPILLARY: Glucose-Capillary: 102 mg/dL — ABNORMAL HIGH (ref 65–99)

## 2016-07-28 LAB — PROTIME-INR
INR: 2.26
PROTHROMBIN TIME: 25.3 s — AB (ref 11.4–15.2)

## 2016-07-28 MED ORDER — WARFARIN SODIUM 2 MG PO TABS
2.0000 mg | ORAL_TABLET | Freq: Once | ORAL | Status: AC
  Administered 2016-07-28: 2 mg via ORAL
  Filled 2016-07-28: qty 1

## 2016-07-28 MED ORDER — IPRATROPIUM-ALBUTEROL 0.5-2.5 (3) MG/3ML IN SOLN
3.0000 mL | Freq: Two times a day (BID) | RESPIRATORY_TRACT | Status: DC
  Administered 2016-07-28 (×2): 3 mL via RESPIRATORY_TRACT
  Filled 2016-07-28 (×2): qty 3

## 2016-07-28 NOTE — Progress Notes (Signed)
qPhysical Therapy Treatment Patient Details Name: [name] MRN: [id number] DOB: 1932-09-20 Today's Date: 07/28/2016    History of Present Illness [name] is a 83 y.o. female with medical history significant of oxygen dependent chronic respiratory failure, CKD, initial fibrillation, chronic combined systolic and diastolic CHF, fibromyalgia, dementia, nonischemic cardiomyopathy, DVT/PE, symptomatic bradycardia status post pacemaker placement, CVA, OSA,  neuropathy, HTN and was admitted for SOB and dx with acute on chronic respiratory failure    PT Comments    Pt very pleasant and very willing to mobilize. Pt reports she wants to be able to walk further and move more but staff at [facility] don't giver her much opportunity. Pt able to increase gait distance today and perform HEP. Pt with BP drop from supine to sitting which rebounded in standing. Will continue to follow and encouraged OOB daily as well as HEP.   sats 90% on RA  Orthostatic BPs  Supine 105/50, HR 65  Sitting 94/40, HR74     Standing 119/60, HR 65         Follow Up Recommendations  SNF     Equipment Recommendations       Recommendations for Other Services       Precautions / Restrictions Precautions Precautions: Fall    Mobility  Bed Mobility Overal bed mobility: Modified Independent                Transfers Overall transfer level: Needs assistance   Transfers: Sit to/from Stand Sit to Stand: Min guard         General transfer comment: guarding for safety due to recurrent falls, cues for hand placement  Ambulation/Gait Ambulation/Gait assistance: Min assist Ambulation Distance (Feet): 40 Feet Assistive device: Rolling walker (2 wheeled) Gait Pattern/deviations: Step-through pattern;Trunk flexed;Decreased stride length   Gait velocity interpretation: Below normal speed for age/gender General Gait Details: cues for posture and position in RW, pt with flexed trunk and decreased  stride. pt reports increased gait distance from baseline   Stairs            Wheelchair Mobility    Modified Rankin (Stroke Patients Only)       Balance Overall balance assessment: Needs assistance   Sitting balance-Leahy Scale: Good       Standing balance-Leahy Scale: Poor                              Cognition Arousal/Alertness: Awake/alert Behavior During Therapy: WFL for tasks assessed/performed Overall Cognitive Status: Within Functional Limits for tasks assessed                                        Exercises General Exercises - Lower Extremity Long Arc QuadBarbaraann [name];Both;Seated;10 reps Hip ABduction/ADduction: AROM;Both;Seated;10 reps Hip Flexion/Marching: AROM;Both;Seated;10 reps    General Comments        Pertinent Vitals/Pain Pain Assessment: No/denies pain    Home Living                      Prior Function            PT Goals (current goals can now be found in the care plan section) Progress towards PT goals: Progressing toward goals    Frequency    Min 2X/week      PT Plan Current plan remains appropriate  Co-evaluation             End of Session Equipment Utilized During Treatment: Gait belt Activity Tolerance: Patient tolerated treatment well Patient left: in chair;with call bell/phone within reach;with chair alarm set Nurse Communication: Mobility status PT Visit Diagnosis: Unsteadiness on feet (R26.81);Muscle weakness (generalized) (M62.81)     Time: 1610-9604 PT Time Calculation (min) (ACUTE ONLY): 23 min  Charges:  $Gait Training: 8-22 mins $Therapeutic Exercise: 8-22 mins                    G Codes:       [name], PT [phone]    [name] B [name] 07/28/2016, 12:00 PM

## 2016-07-28 NOTE — Progress Notes (Addendum)
[name]  ZOX:[id number] DOB: 10-20-1932 DOA: 07/21/2016 PCP: Oneal Grout, MD    Brief Narrative:  83 year-old female with history of oxygen dependent chronic respiratory failure, CKD stage III, atrial fibrillation, combined systolic and diastolic CHF, and nonischemic cardiomyopathy who was admitted to the hospital for worsening of shortness of breath. She was just discharged on 3/29, was noted to have signs of volume overload on chest x-ray and possible underlying pneumonia as well. She was given IV Lasix which significantly improved her breathing as she diuresed well but she still continued to have productive cough. Pro-calcitonin levels were elevated therefore she was started on Levaquin for possible pneumonia.  Subjective: Feels very weak, no dizziness   Assessment & Plan:  Acute on chronic hypoxic respiratory failure -fluid overload in the setting of CHF exacerbation + PNA  -see below  Pneumonia - possible aspiration  -on Levaquin Day 7, stopped abx 4/8  Acute on chronic systolic and diastolic congestive heart failure  -EF 35-40% w/ grade 2 DD via TTE Feb 2018  -improved with diuresis, negative 5.3L, creatinine bumped to 2.5 yesterday, -held torsemide -was discharged off diuretics last admit due to AKI/dehydration -volume status very challenging to manage with CHF/CKF and intermittent Orthostatic symptoms -made cards Fu with Chelsea Aus PA on 4/17 at 11:30am, followed by Dr.Cooper  Chronic atrial fibrillation -Continue amiodarone BB and Coumadin - rate controlled - CHA2DS2-VASc is at least 5  -stopped heparin, continue warfarin, INR >2  S/P TAVR 2014  Bradycardia status post pacemaker   Hypertension  -table, monitor  Chronic pain and neuropathy continue Lyrica and Cymbalta   Mild dementia continue Namenda and Aricept  Gout continue Uloric  Hyperlipidemia Continue statin  Hypothyroidism continue Synthroid  Chronic pain  DVT prophylaxis:  warfarin Code Status:  DO NOT INTUBATE  Family Communication: no family present at time of exam  Disposition Plan: SNF in few days, when stable  Consultants:  none  Procedures: none  Antimicrobials:  Levaquin 4/2 >  Objective: Blood pressure (!) 168/74, pulse 65, temperature 97.8 F (36.6 C), temperature source Oral, resp. rate 18, height 4\' 10"  (1.473 m), weight 78.4 kg (172 lb 14.4 oz), SpO2 90 %.  Intake/Output Summary (Last 24 hours) at 07/28/16 1336 Last data filed at 07/28/16 1245  Gross per 24 hour  Intake              360 ml  Output             1200 ml  Net             -840 ml   Filed Weights   07/26/16 0608 07/27/16 0350 07/28/16 0500  Weight: 78.4 kg (172 lb 12.8 oz) 77.7 kg (171 lb 3.2 oz) 78.4 kg (172 lb 14.4 oz)    Examination: General: No distress, AAOx3 Lungs: diminished at bases Cardiovascular: Regular rate and rhythm without murmur gallop or rub normal S1 and S2 Abdomen: Nontender, nondistended, soft, bowel sounds positive, no rebound, no ascites, no appreciable mass Extremities: No significant cyanosis, clubbing, or edema bilateral lower extremities Neuro: non focal Skin: some bruises noted  CBC:  Recent Labs Lab 07/22/16 0202 07/24/16 0216 07/25/16 0443  WBC 12.2* 7.4 6.5  HGB 9.4* 9.8* 9.5*  HCT 28.8* 30.8* 30.3*  MCV 95.0 94.5 95.9  PLT 175 175 191   Basic Metabolic Panel:  Recent Labs Lab 07/24/16 0216 07/25/16 0443 07/26/16 0417 07/27/16 0218 07/28/16 0838  NA 143 138 140 139 138  K 3.3*  4.0 3.9 4.5 4.1  CL 98* 95* 97* 101 97*  CO2 34* 30 31 31 28   GLUCOSE 101* 87 86 102* 93  BUN 26* 28* 30* 41* 41*  CREATININE 1.79* 2.04* 1.86* 2.59* 2.20*  CALCIUM 8.7* 8.0* 8.4* 8.9 9.1    Coagulation Profile:  Recent Labs Lab 07/24/16 0216 07/25/16 0443 07/26/16 0417 07/27/16 0218 07/28/16 0551  INR 2.13 2.87 2.92 2.20 2.26    HbA1C: Hgb A1c MFr Bld  Date/Time Value Ref Range Status  06/14/2016 02:44 AM 5.0 4.8 - 5.6 %  Final    Comment:    (NOTE)         Pre-diabetes: 5.7 - 6.4         Diabetes: >6.4         Glycemic control for adults with diabetes: <7.0   12/15/2014 01:27 AM 5.5 4.8 - 5.6 % Final    Comment:    (NOTE)         Pre-diabetes: 5.7 - 6.4         Diabetes: >6.4         Glycemic control for adults with diabetes: <7.0     CBG:  Recent Labs Lab 07/28/16 1213  GLUCAP 102*    Recent Results (from the past 240 hour(s))  Blood Culture (routine x 2)     Status: Abnormal   Collection Time: 07/21/16  6:50 AM  Result Value Ref Range Status   Specimen Description BLOOD LEFT ANTECUBITAL  Final   Special Requests BOTTLES DRAWN AEROBIC AND ANAEROBIC 5CC  Final   Culture  Setup Time   Final    ANAEROBIC BOTTLE ONLY GRAM POSITIVE RODS CRITICAL RESULT CALLED TO, READ BACK BY AND VERIFIED WITH: V BRYK,PHARMD AT 0712 07/26/16 BY L BENFIELD    Culture (A)  Final    DIPHTHEROIDS(CORYNEBACTERIUM SPECIES) Standardized susceptibility testing for this organism is not available.    Report Status 07/28/2016 FINAL  Final  Blood Culture ID Panel (Reflexed)     Status: None   Collection Time: 07/21/16  6:50 AM  Result Value Ref Range Status   Enterococcus species NOT DETECTED NOT DETECTED Final   Listeria monocytogenes NOT DETECTED NOT DETECTED Final   Staphylococcus species NOT DETECTED NOT DETECTED Final   Staphylococcus aureus NOT DETECTED NOT DETECTED Final   Streptococcus species NOT DETECTED NOT DETECTED Final   Streptococcus agalactiae NOT DETECTED NOT DETECTED Final   Streptococcus pneumoniae NOT DETECTED NOT DETECTED Final   Streptococcus pyogenes NOT DETECTED NOT DETECTED Final   Acinetobacter baumannii NOT DETECTED NOT DETECTED Final   Enterobacteriaceae species NOT DETECTED NOT DETECTED Final   Enterobacter cloacae complex NOT DETECTED NOT DETECTED Final   Escherichia coli NOT DETECTED NOT DETECTED Final   Klebsiella oxytoca NOT DETECTED NOT DETECTED Final   Klebsiella pneumoniae NOT  DETECTED NOT DETECTED Final   Proteus species NOT DETECTED NOT DETECTED Final   Serratia marcescens NOT DETECTED NOT DETECTED Final   Haemophilus influenzae NOT DETECTED NOT DETECTED Final   Neisseria meningitidis NOT DETECTED NOT DETECTED Final   Pseudomonas aeruginosa NOT DETECTED NOT DETECTED Final   Candida albicans NOT DETECTED NOT DETECTED Final   Candida glabrata NOT DETECTED NOT DETECTED Final   Candida krusei NOT DETECTED NOT DETECTED Final   Candida parapsilosis NOT DETECTED NOT DETECTED Final   Candida tropicalis NOT DETECTED NOT DETECTED Final  Blood Culture (routine x 2)     Status: None   Collection Time: 07/21/16  7:57 AM  Result Value Ref Range Status   Specimen Description BLOOD RIGHT HAND ARPEDB  Final   Special Requests 2CC  Final   Culture NO GROWTH 5 DAYS  Final   Report Status 07/26/2016 FINAL  Final  Respiratory Panel by PCR     Status: None   Collection Time: 07/21/16  4:02 PM  Result Value Ref Range Status   Adenovirus NOT DETECTED NOT DETECTED Final   Coronavirus 229E NOT DETECTED NOT DETECTED Final   Coronavirus HKU1 NOT DETECTED NOT DETECTED Final   Coronavirus NL63 NOT DETECTED NOT DETECTED Final   Coronavirus OC43 NOT DETECTED NOT DETECTED Final   Metapneumovirus NOT DETECTED NOT DETECTED Final   Rhinovirus / Enterovirus NOT DETECTED NOT DETECTED Final   Influenza A NOT DETECTED NOT DETECTED Final   Influenza A H1 NOT DETECTED NOT DETECTED Final   Influenza A H1 2009 NOT DETECTED NOT DETECTED Final   Influenza A H3 NOT DETECTED NOT DETECTED Final   Influenza B NOT DETECTED NOT DETECTED Final   Parainfluenza Virus 1 NOT DETECTED NOT DETECTED Final   Parainfluenza Virus 2 NOT DETECTED NOT DETECTED Final   Parainfluenza Virus 3 NOT DETECTED NOT DETECTED Final   Parainfluenza Virus 4 NOT DETECTED NOT DETECTED Final   Respiratory Syncytial Virus NOT DETECTED NOT DETECTED Final   Bordetella pertussis NOT DETECTED NOT DETECTED Final   Chlamydophila  pneumoniae NOT DETECTED NOT DETECTED Final   Mycoplasma pneumoniae NOT DETECTED NOT DETECTED Final     Scheduled Meds: . amiodarone  100 mg Oral Daily  . atorvastatin  10 mg Oral q1800  . carvedilol  6.25 mg Oral BID WC  . cycloSPORINE  1 drop Both Eyes BID  . diclofenac sodium  2 g Topical TID AC & HS  . donepezil  10 mg Oral QHS  . DULoxetine  60 mg Oral q1800  . febuxostat  40 mg Oral q1800  . ferrous sulfate  325 mg Oral Daily  . hydrALAZINE  10 mg Oral TID  . ipratropium-albuterol  3 mL Nebulization BID  . levothyroxine  112 mcg Oral QAC breakfast  . linaclotide  145 mcg Oral QAC breakfast  . memantine  5 mg Oral BID  . pantoprazole  80 mg Oral Q1200  . Warfarin - Pharmacist Dosing Inpatient   Does not apply q1800   Continuous Infusions:    LOS: 7 days   Zannie Cove, MD [facility] Office  [phone] Pager - Text Page per Amion as per below:  On-Call/Text Page:      [email or web address]      password [email or web address]  If 7PM-7AM, please contact night-coverage [email or web address] Password [email or web address] 07/28/2016, 1:36 PM

## 2016-07-28 NOTE — Progress Notes (Signed)
ANTICOAGULATION CONSULT NOTE  Pharmacy Consult for warfarin Indication: atrial fibrillation   Allergies  Allergen Reactions  . Nubain [Nalbuphine Hcl] Hives    Went into cardiac arrest   . Codeine Hives and Nausea Only    Has tolerated Oxycodone  . Darvocet [Propoxyphene N-Acetaminophen] Nausea Only    Patient Measurements: Height: 4\' 10"  (147.3 cm) Weight: 172 lb 14.4 oz (78.4 kg) IBW/kg (Calculated) : 40.9  Vital Signs: Temp: 97.8 F (36.6 C) (04/09 0500) Temp Source: Oral (04/09 0500) BP: 168/74 (04/09 0500) Pulse Rate: 87 (04/09 0500)  Labs:  Recent Labs  07/26/16 0417 07/27/16 0218 07/28/16 0551 07/28/16 0838  LABPROT 31.1* 24.8* 25.3*  --   INR 2.92 2.20 2.26  --   CREATININE 1.86* 2.59*  --  2.20*    Estimated Creatinine Clearance: 17.4 mL/min (A) (by C-G formula based on SCr of 2.2 mg/dL (H)).   Assessment: 35 yof with hx of afib on warfarin PTA. Also with hx DVT/PE noted. Continues on warfarin.  Levofloxacin now complete  INR = 2.26 today  PTA warfarin dose: 1.5mg  daily (last dose 07/20/16 PTA)  Goal of Therapy:  INR 2-3 Monitor platelets by anticoagulation protocol: Yes   Plan:  Warfarin 2 mg x1 tonight  Daily INR   Thank you Okey Regal, PharmD [phone] 07/28/16 10:13 AM

## 2016-07-29 LAB — PROTIME-INR
INR: 2.36
Prothrombin Time: 26.3 seconds — ABNORMAL HIGH (ref 11.4–15.2)

## 2016-07-29 LAB — BASIC METABOLIC PANEL
ANION GAP: 10 (ref 5–15)
BUN: 40 mg/dL — AB (ref 6–20)
CHLORIDE: 101 mmol/L (ref 101–111)
CO2: 29 mmol/L (ref 22–32)
Calcium: 9.2 mg/dL (ref 8.9–10.3)
Creatinine, Ser: 2.25 mg/dL — ABNORMAL HIGH (ref 0.44–1.00)
GFR, EST AFRICAN AMERICAN: 22 mL/min — AB (ref >60)
GFR, EST NON AFRICAN AMERICAN: 19 mL/min — AB (ref >60)
Glucose, Bld: 96 mg/dL (ref 65–99)
POTASSIUM: 4.3 mmol/L (ref 3.5–5.1)
SODIUM: 140 mmol/L (ref 135–145)

## 2016-07-29 MED ORDER — WARFARIN SODIUM 2 MG PO TABS
2.0000 mg | ORAL_TABLET | Freq: Every day | ORAL | Status: DC
  Administered 2016-07-29: 2 mg via ORAL
  Filled 2016-07-29: qty 1

## 2016-07-29 MED ORDER — TORSEMIDE 20 MG PO TABS
20.0000 mg | ORAL_TABLET | Freq: Every day | ORAL | Status: DC
  Administered 2016-07-29 – 2016-07-30 (×2): 20 mg via ORAL
  Filled 2016-07-29 (×2): qty 1

## 2016-07-29 NOTE — Progress Notes (Signed)
ANTICOAGULATION CONSULT NOTE  Pharmacy Consult for warfarin Indication: atrial fibrillation   Allergies  Allergen Reactions  . Nubain [Nalbuphine Hcl] Hives    Went into cardiac arrest   . Codeine Hives and Nausea Only    Has tolerated Oxycodone  . Darvocet [Propoxyphene N-Acetaminophen] Nausea Only    Patient Measurements: Height: 4\' 10"  (147.3 cm) Weight: 173 lb 1.6 oz (78.5 kg) (c scale) IBW/kg (Calculated) : 40.9  Vital Signs: Temp: 97.8 F (36.6 C) (04/10 0817) Temp Source: Oral (04/10 0817) BP: 165/76 (04/10 0817) Pulse Rate: 82 (04/10 0817)  Labs:  Recent Labs  07/27/16 0218 07/28/16 0551 07/28/16 0838 07/29/16 0813  LABPROT 24.8* 25.3*  --  26.3*  INR 2.20 2.26  --  2.36  CREATININE 2.59*  --  2.20* 2.25*    Estimated Creatinine Clearance: 17 mL/min (A) (by C-G formula based on SCr of 2.25 mg/dL (H)).   Assessment: [name] with hx of afib on warfarin PTA. Also with hx DVT/PE noted. Continues on warfarin.  Levofloxacin now complete  INR = 2.36 today  PTA warfarin dose: 1.5mg  daily (INR low on admission)  Goal of Therapy:  INR 2-3 Monitor platelets by anticoagulation protocol: Yes   Plan:  Warfarin 2 mg po daily Daily INR   Thank you Okey Regal, PharmD [phone] 07/29/16 10:58 AM

## 2016-07-29 NOTE — Progress Notes (Signed)
[name]  ZOX:[id number] DOB: [birth year] DOA: 07/21/2016 PCP: Oneal Grout, MD    Brief Narrative:  83 year-old female with history of oxygen dependent chronic respiratory failure, CKD stage III, atrial fibrillation, combined systolic and diastolic CHF, and nonischemic cardiomyopathy who was admitted to the hospital for worsening of shortness of breath. She was just discharged on 3/29, was noted to have signs of volume overload on chest x-ray and possible underlying pneumonia as well. She was given IV Lasix which significantly improved her breathing as she diuresed well but she still continued to have productive cough. Pro-calcitonin levels were elevated therefore she was started on Levaquin for possible pneumonia.  Subjective: Feels very weak, no dizziness   Assessment & Plan:  Acute on chronic hypoxic respiratory failure -fluid overload in the setting of CHF exacerbation + PNA  -see below   Pneumonia - possible aspiration  -completed 7days of Levaquin, stopped abx 4/8  Acute on chronic systolic and diastolic congestive heart failure  -EF 35-40% w/ grade 2 DD via TTE Feb 2018  -improved with diuresis, negative 6.3L, creatinine bumped to 2.5 on 4/8 then diuretics held till 4/9 -resume tosemide at low dose of 20mg  and discharge to SNF in next day or two if creatinine stable -was discharged off diuretics last admit due to AKI/dehydration -volume status very challenging to manage with CHF/CKF and intermittent Orthostatic symptoms -made cards Fu with Chelsea Aus PA on 4/17 at 11:30am, followed by Dr.Cooper  Chronic atrial fibrillation -Continue amiodarone BB and Coumadin - rate controlled - CHA2DS2-VASc is at least 5  -stopped heparin, continue warfarin, INR >2  S/P TAVR 2014  Bradycardia status post pacemaker   Hypertension  -table, monitor  Chronic pain and neuropathy continue Lyrica and Cymbalta   Mild dementia continue Namenda and Aricept  Gout continue  Uloric  Hyperlipidemia Continue statin  Hypothyroidism continue Synthroid  Chronic pain  DVT prophylaxis: warfarin Code Status:  DO NOT INTUBATE  Family Communication: no family present at time of exam  Disposition Plan: SNF Wednesday or Thursday if volume status and creatinine stable  Consultants:  none  Procedures: none  Antimicrobials:  Levaquin 4/2 >  Objective: Blood pressure (!) 165/76, pulse 82, temperature 97.8 F (36.6 C), temperature source Oral, resp. rate 18, height 4\' 10"  (1.473 m), weight 78.5 kg (173 lb 1.6 oz), SpO2 92 %.  Intake/Output Summary (Last 24 hours) at 07/29/16 1254 Last data filed at 07/29/16 1205  Gross per 24 hour  Intake              220 ml  Output             1050 ml  Net             -830 ml   Filed Weights   07/27/16 0350 07/28/16 0500 07/29/16 0502  Weight: 77.7 kg (171 lb 3.2 oz) 78.4 kg (172 lb 14.4 oz) 78.5 kg (173 lb 1.6 oz)    Examination: General: No distress, AAOx3 Lungs: diminished at bases Cardiovascular: Regular rate and rhythm without murmur gallop or rub normal S1 and S2 Abdomen: Nontender, nondistended, soft, bowel sounds positive, no rebound, no ascites, no appreciable mass Extremities: No significant cyanosis, clubbing, or edema bilateral lower extremities Neuro: non focal Skin: some bruises noted  CBC:  Recent Labs Lab 07/24/16 0216 07/25/16 0443  WBC 7.4 6.5  HGB 9.8* 9.5*  HCT 30.8* 30.3*  MCV 94.5 95.9  PLT 175 191   Basic Metabolic Panel:  Recent Labs  Lab 07/25/16 0443 07/26/16 0417 07/27/16 0218 07/28/16 0838 07/29/16 0813  NA 138 140 139 138 140  K 4.0 3.9 4.5 4.1 4.3  CL 95* 97* 101 97* 101  CO2 30 31 31 28 29   GLUCOSE 87 86 102* 93 96  BUN 28* 30* 41* 41* 40*  CREATININE 2.04* 1.86* 2.59* 2.20* 2.25*  CALCIUM 8.0* 8.4* 8.9 9.1 9.2    Coagulation Profile:  Recent Labs Lab 07/25/16 0443 07/26/16 0417 07/27/16 0218 07/28/16 0551 07/29/16 0813  INR 2.87 2.92 2.20 2.26 2.36     HbA1C: Hgb A1c MFr Bld  Date/Time Value Ref Range Status  06/14/2016 02:44 AM 5.0 4.8 - 5.6 % Final    Comment:    (NOTE)         Pre-diabetes: 5.7 - 6.4         Diabetes: >6.4         Glycemic control for adults with diabetes: <7.0   12/15/2014 01:27 AM 5.5 4.8 - 5.6 % Final    Comment:    (NOTE)         Pre-diabetes: 5.7 - 6.4         Diabetes: >6.4         Glycemic control for adults with diabetes: <7.0     CBG:  Recent Labs Lab 07/28/16 1213  GLUCAP 102*    Recent Results (from the past 240 hour(s))  Blood Culture (routine x 2)     Status: Abnormal   Collection Time: 07/21/16  6:50 AM  Result Value Ref Range Status   Specimen Description BLOOD LEFT ANTECUBITAL  Final   Special Requests BOTTLES DRAWN AEROBIC AND ANAEROBIC 5CC  Final   Culture  Setup Time   Final    ANAEROBIC BOTTLE ONLY GRAM POSITIVE RODS CRITICAL RESULT CALLED TO, READ BACK BY AND VERIFIED WITH: V BRYK,PHARMD AT 0712 07/26/16 BY L BENFIELD    Culture (A)  Final    DIPHTHEROIDS(CORYNEBACTERIUM SPECIES) Standardized susceptibility testing for this organism is not available.    Report Status 07/28/2016 FINAL  Final  Blood Culture ID Panel (Reflexed)     Status: None   Collection Time: 07/21/16  6:50 AM  Result Value Ref Range Status   Enterococcus species NOT DETECTED NOT DETECTED Final   Listeria monocytogenes NOT DETECTED NOT DETECTED Final   Staphylococcus species NOT DETECTED NOT DETECTED Final   Staphylococcus aureus NOT DETECTED NOT DETECTED Final   Streptococcus species NOT DETECTED NOT DETECTED Final   Streptococcus agalactiae NOT DETECTED NOT DETECTED Final   Streptococcus pneumoniae NOT DETECTED NOT DETECTED Final   Streptococcus pyogenes NOT DETECTED NOT DETECTED Final   Acinetobacter baumannii NOT DETECTED NOT DETECTED Final   Enterobacteriaceae species NOT DETECTED NOT DETECTED Final   Enterobacter cloacae complex NOT DETECTED NOT DETECTED Final   Escherichia coli NOT  DETECTED NOT DETECTED Final   Klebsiella oxytoca NOT DETECTED NOT DETECTED Final   Klebsiella pneumoniae NOT DETECTED NOT DETECTED Final   Proteus species NOT DETECTED NOT DETECTED Final   Serratia marcescens NOT DETECTED NOT DETECTED Final   Haemophilus influenzae NOT DETECTED NOT DETECTED Final   Neisseria meningitidis NOT DETECTED NOT DETECTED Final   Pseudomonas aeruginosa NOT DETECTED NOT DETECTED Final   Candida albicans NOT DETECTED NOT DETECTED Final   Candida glabrata NOT DETECTED NOT DETECTED Final   Candida krusei NOT DETECTED NOT DETECTED Final   Candida parapsilosis NOT DETECTED NOT DETECTED Final   Candida tropicalis NOT DETECTED NOT DETECTED Final  Blood Culture (routine x 2)     Status: None   Collection Time: 07/21/16  7:57 AM  Result Value Ref Range Status   Specimen Description BLOOD RIGHT HAND ARPEDB  Final   Special Requests 2CC  Final   Culture NO GROWTH 5 DAYS  Final   Report Status 07/26/2016 FINAL  Final  Respiratory Panel by PCR     Status: None   Collection Time: 07/21/16  4:02 PM  Result Value Ref Range Status   Adenovirus NOT DETECTED NOT DETECTED Final   Coronavirus 229E NOT DETECTED NOT DETECTED Final   Coronavirus HKU1 NOT DETECTED NOT DETECTED Final   Coronavirus NL63 NOT DETECTED NOT DETECTED Final   Coronavirus OC43 NOT DETECTED NOT DETECTED Final   Metapneumovirus NOT DETECTED NOT DETECTED Final   Rhinovirus / Enterovirus NOT DETECTED NOT DETECTED Final   Influenza A NOT DETECTED NOT DETECTED Final   Influenza A H1 NOT DETECTED NOT DETECTED Final   Influenza A H1 2009 NOT DETECTED NOT DETECTED Final   Influenza A H3 NOT DETECTED NOT DETECTED Final   Influenza B NOT DETECTED NOT DETECTED Final   Parainfluenza Virus 1 NOT DETECTED NOT DETECTED Final   Parainfluenza Virus 2 NOT DETECTED NOT DETECTED Final   Parainfluenza Virus 3 NOT DETECTED NOT DETECTED Final   Parainfluenza Virus 4 NOT DETECTED NOT DETECTED Final   Respiratory Syncytial Virus  NOT DETECTED NOT DETECTED Final   Bordetella pertussis NOT DETECTED NOT DETECTED Final   Chlamydophila pneumoniae NOT DETECTED NOT DETECTED Final   Mycoplasma pneumoniae NOT DETECTED NOT DETECTED Final     Scheduled Meds: . amiodarone  100 mg Oral Daily  . atorvastatin  10 mg Oral q1800  . carvedilol  6.25 mg Oral BID WC  . cycloSPORINE  1 drop Both Eyes BID  . diclofenac sodium  2 g Topical TID AC & HS  . donepezil  10 mg Oral QHS  . DULoxetine  60 mg Oral q1800  . febuxostat  40 mg Oral q1800  . ferrous sulfate  325 mg Oral Daily  . hydrALAZINE  10 mg Oral TID  . levothyroxine  112 mcg Oral QAC breakfast  . linaclotide  145 mcg Oral QAC breakfast  . memantine  5 mg Oral BID  . pantoprazole  80 mg Oral Q1200  . warfarin  2 mg Oral q1800  . Warfarin - Pharmacist Dosing Inpatient   Does not apply q1800   Continuous Infusions:    LOS: 8 days   Zannie Cove, MD [facility] Office  [phone] Pager - Text Page per Amion as per below:  On-Call/Text Page:      [email or web address]      password [email or web address]  If 7PM-7AM, please contact night-coverage [email or web address] Password [facility] 07/29/2016, 12:54 PM

## 2016-07-29 NOTE — Clinical Social Work Note (Signed)
CSW continues to follow for discharge needs.  Lynasia Meloche, CSW [phone]  

## 2016-07-29 NOTE — Progress Notes (Signed)
Occupational Therapy Treatment Patient Details Name: [name] MRN: [id number] DOB: May 18, 1932 Today's Date: 07/29/2016    History of present illness [name] is a 83 y.o. female with medical history significant of oxygen dependent chronic respiratory failure, CKD, initial fibrillation, chronic combined systolic and diastolic CHF, fibromyalgia, dementia, nonischemic cardiomyopathy, DVT/PE, symptomatic bradycardia status post pacemaker placement, CVA, OSA,  neuropathy, HTN and was admitted for SOB and dx with acute on chronic respiratory failure   OT comments  Pt. Was sitting in recliner chair and refused to attempt standing. Pt. Refused to attempt transfer to commode. Pt. Was agreeable to grooming and ue dressing while sitting in recliner.   Follow Up Recommendations  SNF    Equipment Recommendations       Recommendations for Other Services      Precautions / Restrictions Precautions Precautions: Fall       Mobility Bed Mobility                  Transfers                      Balance                                           ADL either performed or assessed with clinical judgement   ADL Overall ADL's : Independent     Grooming: Wash/dry hands;Wash/dry face;Supervision/safety           Upper Body Dressing : Minimal assistance                     General ADL Comments: Pt. was agreeable to grooming and ue dressing while in chair.     Vision       Perception     Praxis      Cognition Arousal/Alertness: Awake/alert                                              Exercises     Shoulder Instructions       General Comments      Pertinent Vitals/ Pain       Pain Assessment: 0-10 Pain Score: 5  Pain Location:  (all over) Pain Descriptors / Indicators: Aching  Home Living                                          Prior Functioning/Environment               Frequency           Progress Toward Goals  OT Goals(current goals can now be found in the care plan section)  Progress towards OT goals: Progressing toward goals  Acute Rehab OT Goals Patient Stated Goal:  (get stronger)  Plan Discharge plan remains appropriate    Co-evaluation                 End of Session        Activity Tolerance Patient tolerated treatment well   Patient Left in chair;with call bell/phone within reach   Nurse Communication          Time: [phone]  OT Time Calculation (min): 28 min  Charges: OT General Charges $OT Visit: 1 Procedure OT Treatments $Self Care/Home Management : 23-37 mins  6 clicks, clinical judgement   Tatum Massman 07/29/2016, 3:41 PM

## 2016-07-29 NOTE — Patient Outreach (Signed)
Mrs. Darbie Duchene  is in the hospital ,patient may be going on dialysis.spoke to patient son.   Lillia Abed CPhT Pharmacy Technician [facility] Care Management Direct Dial [phone]  Fax [phone] [email or web address]

## 2016-07-29 NOTE — Plan of Care (Signed)
Problem: Activity: Goal: Risk for activity intolerance will decrease Outcome: Progressing Patient able to ambulate to [facility] with one assist. Decreased incidence of SOB. Sats maintaining above 95%.

## 2016-07-30 ENCOUNTER — Inpatient Hospital Stay (HOSPITAL_COMMUNITY): Payer: Medicare Other

## 2016-07-30 DIAGNOSIS — I509 Heart failure, unspecified: Secondary | ICD-10-CM | POA: Diagnosis not present

## 2016-07-30 DIAGNOSIS — D631 Anemia in chronic kidney disease: Secondary | ICD-10-CM | POA: Diagnosis not present

## 2016-07-30 DIAGNOSIS — I5043 Acute on chronic combined systolic (congestive) and diastolic (congestive) heart failure: Secondary | ICD-10-CM | POA: Diagnosis not present

## 2016-07-30 DIAGNOSIS — N2581 Secondary hyperparathyroidism of renal origin: Secondary | ICD-10-CM | POA: Diagnosis not present

## 2016-07-30 DIAGNOSIS — M6281 Muscle weakness (generalized): Secondary | ICD-10-CM | POA: Diagnosis not present

## 2016-07-30 DIAGNOSIS — I5032 Chronic diastolic (congestive) heart failure: Secondary | ICD-10-CM | POA: Diagnosis not present

## 2016-07-30 DIAGNOSIS — M5441 Lumbago with sciatica, right side: Secondary | ICD-10-CM | POA: Diagnosis not present

## 2016-07-30 DIAGNOSIS — R1032 Left lower quadrant pain: Secondary | ICD-10-CM | POA: Diagnosis not present

## 2016-07-30 DIAGNOSIS — I129 Hypertensive chronic kidney disease with stage 1 through stage 4 chronic kidney disease, or unspecified chronic kidney disease: Secondary | ICD-10-CM | POA: Diagnosis not present

## 2016-07-30 DIAGNOSIS — R2689 Other abnormalities of gait and mobility: Secondary | ICD-10-CM | POA: Diagnosis not present

## 2016-07-30 DIAGNOSIS — I480 Paroxysmal atrial fibrillation: Secondary | ICD-10-CM | POA: Diagnosis not present

## 2016-07-30 DIAGNOSIS — Z950 Presence of cardiac pacemaker: Secondary | ICD-10-CM | POA: Diagnosis not present

## 2016-07-30 DIAGNOSIS — M545 Low back pain: Secondary | ICD-10-CM | POA: Diagnosis not present

## 2016-07-30 DIAGNOSIS — I10 Essential (primary) hypertension: Secondary | ICD-10-CM | POA: Diagnosis not present

## 2016-07-30 DIAGNOSIS — R531 Weakness: Secondary | ICD-10-CM | POA: Diagnosis not present

## 2016-07-30 DIAGNOSIS — N184 Chronic kidney disease, stage 4 (severe): Secondary | ICD-10-CM | POA: Diagnosis not present

## 2016-07-30 DIAGNOSIS — J9621 Acute and chronic respiratory failure with hypoxia: Secondary | ICD-10-CM | POA: Diagnosis not present

## 2016-07-30 DIAGNOSIS — M25532 Pain in left wrist: Secondary | ICD-10-CM | POA: Diagnosis not present

## 2016-07-30 DIAGNOSIS — J80 Acute respiratory distress syndrome: Secondary | ICD-10-CM | POA: Diagnosis not present

## 2016-07-30 DIAGNOSIS — E039 Hypothyroidism, unspecified: Secondary | ICD-10-CM | POA: Diagnosis not present

## 2016-07-30 DIAGNOSIS — R2681 Unsteadiness on feet: Secondary | ICD-10-CM | POA: Diagnosis not present

## 2016-07-30 DIAGNOSIS — J690 Pneumonitis due to inhalation of food and vomit: Secondary | ICD-10-CM | POA: Diagnosis not present

## 2016-07-30 DIAGNOSIS — M25551 Pain in right hip: Secondary | ICD-10-CM | POA: Diagnosis not present

## 2016-07-30 DIAGNOSIS — Z9181 History of falling: Secondary | ICD-10-CM | POA: Diagnosis not present

## 2016-07-30 LAB — BASIC METABOLIC PANEL
ANION GAP: 9 (ref 5–15)
BUN: 46 mg/dL — ABNORMAL HIGH (ref 6–20)
CALCIUM: 8.5 mg/dL — AB (ref 8.9–10.3)
CO2: 29 mmol/L (ref 22–32)
CREATININE: 2.46 mg/dL — AB (ref 0.44–1.00)
Chloride: 99 mmol/L — ABNORMAL LOW (ref 101–111)
GFR, EST AFRICAN AMERICAN: 20 mL/min — AB (ref >60)
GFR, EST NON AFRICAN AMERICAN: 17 mL/min — AB (ref >60)
Glucose, Bld: 102 mg/dL — ABNORMAL HIGH (ref 65–99)
Potassium: 4.3 mmol/L (ref 3.5–5.1)
SODIUM: 137 mmol/L (ref 135–145)

## 2016-07-30 LAB — PROTIME-INR
INR: 2.53
Prothrombin Time: 27.7 s — ABNORMAL HIGH (ref 11.4–15.2)

## 2016-07-30 MED ORDER — SENNOSIDES-DOCUSATE SODIUM 8.6-50 MG PO TABS: 1.0000 | ORAL_TABLET | Freq: Two times a day (BID) | ORAL | Status: DC

## 2016-07-30 MED ORDER — POLYETHYLENE GLYCOL 3350 17 G PO PACK: 17.0000 g | PACK | Freq: Two times a day (BID) | ORAL | 0 refills | Status: DC

## 2016-07-30 MED ORDER — TORSEMIDE 20 MG PO TABS: 20.0000 mg | ORAL_TABLET | Freq: Every day | ORAL | Status: DC

## 2016-07-30 MED ORDER — SENNOSIDES-DOCUSATE SODIUM 8.6-50 MG PO TABS: 1.0000 | ORAL_TABLET | Freq: Two times a day (BID) | ORAL | 0 refills | Status: DC

## 2016-07-30 MED ORDER — BISACODYL 10 MG RE SUPP: 10.0000 mg | Freq: Every day | RECTAL | Status: DC | PRN

## 2016-07-30 MED ORDER — POLYETHYLENE GLYCOL 3350 17 G PO PACK: 17.0000 g | PACK | Freq: Two times a day (BID) | ORAL | Status: DC

## 2016-07-30 NOTE — Clinical Social Work Note (Signed)
CSW facilitated patient discharge including contacting facility to confirm patient discharge plans. Patient declined having CSW call her son. She stated she called him this morning. Clinical information faxed to facility and family agreeable with plan. CSW arranged ambulance transport via [facility] to [facility] at 2:30 pm. RN to call report prior to discharge [phone]).  CSW will sign off for now as social work intervention is no longer needed. Please consult [place] again if new needs arise.  Charlynn Court, CSW [phone]

## 2016-07-30 NOTE — Discharge Summary (Signed)
Physician Discharge Summary  ALVEENA TAIRA AVW:[id number] DOB: 01-20-1933 DOA: 07/21/2016  PCP: Oneal Grout, MD  Admit date: 07/21/2016 Discharge date: 07/30/2016  Time spent: 35 minutes  Recommendations for Outpatient Follow-up:  1. Fu with Chelsea Aus PA on 4/17 at 11:30am 2. PCP in 2days, please check BMet in 3days, adjust torsemide dose based on weight/volume status/kidney function  Discharge Diagnoses:     Acute on chronic combined systolic and diastolic congestive heart failure (HCC)   Paroxysmal atrial fibrillation (HCC)   Hypertension   S/P TAVR (transcatheter aortic valve replacement)   CKD (chronic kidney disease), stage III-4   Dementia without behavioral disturbance   Hypothyroidism   Acute on chronic respiratory failure with hypoxia (HCC)   Chronic pain  Discharge Condition: stable  Diet recommendation: low sodium heart healthy  Filed Weights   07/28/16 0500 07/29/16 0502 07/30/16 0500  Weight: 78.4 kg (172 lb 14.4 oz) 78.5 kg (173 lb 1.6 oz) 79.1 kg (174 lb 6.4 oz)    History of present illness:  83 year-old female with history of oxygen dependent chronic respiratory failure, CKD stage III, atrial fibrillation, combined systolic and diastolic CHF, and nonischemic cardiomyopathy who was admitted to the hospital for worsening of shortness of breath. She was just discharged on 3/29, was noted to have signs of volume overload on chest x-ray and possible underlying pneumonia   Hospital Course:  Acute on chronic hypoxic respiratory failure -fluid overload in the setting of CHF exacerbation + PNA  -see below   Pneumonia - possible aspiration  -completed 7days of Levaquin, stopped abx 4/8  Acute on chronic systolic and diastolic congestive heart failure  -EF 35-40% w/ grade 2 DD via TTE Feb 2018  -improved with diuresis, negative 6.3L, creatinine bumped to 2.5 on 4/8 then diuretics held till 4/9 -resumed tosemide at low dose of 20mg  on 4/10 -was discharged off  diuretics last admit due to AKI/dehydration -volume status very challenging to manage with CHF/CKF and intermittent Orthostatic symptoms -made cards Fu with Chelsea Aus PA on 4/17 at 11:30am, followed by Dr.Cooper  Chronic atrial fibrillation -Continue amiodarone BB and Coumadin - rate controlled - CHA2DS2-VASc is at least 5  -stopped heparin, continue warfarin, INR >2  S/P TAVR 2014  Bradycardia status post pacemaker   Hypertension -table, monitor  Chronic pain and neuropathy continue Lyrica and Cymbalta   Mild dementia continue Namenda and Aricept  Gout continue Uloric  Hyperlipidemia Continue statin  Hypothyroidism continue Synthroid  Left sided abdominal pain Constipation X ray abdomen negative for acute abnormality  Continue stool softener.   Discharge Exam: Vitals:   07/29/16 2038 07/30/16 0500  BP: (!) 126/39 (!) 159/65  Pulse: 65 85  Resp: 18 18  Temp: 97.8 F (36.6 C) 97.8 F (36.6 C)    General: AAOx3 Cardiovascular: S1S2/RRR Respiratory: CTAB  Discharge Instructions   Discharge Instructions    Diet - low sodium heart healthy    Complete by:  As directed    Discharge instructions    Complete by:  As directed    It is important that you read following instructions as well as go over your medication list with RN to help you understand your care after this hospitalization.  Discharge Instructions: Please follow-up with PCP in one week  Please request your primary care physician to go over all Hospital Tests and Procedure/Radiological results at the follow up,  Please get all Hospital records sent to your PCP by signing hospital release before you go home.  Do not take more than prescribed Pain, Sleep and Anxiety Medications. You were cared for by a hospitalist during your hospital stay. If you have any questions about your discharge medications or the care you received while you were in the hospital after you are discharged, you can  call the unit and ask to speak with the hospitalist on call if the hospitalist that took care of you is not available.  Once you are discharged, your primary care physician will handle any further medical issues. Please note that NO REFILLS for any discharge medications will be authorized once you are discharged, as it is imperative that you return to your primary care physician (or establish a relationship with a primary care physician if you do not have one) for your aftercare needs so that they can reassess your need for medications and monitor your lab values. You Must read complete instructions/literature along with all the possible adverse reactions/side effects for all the Medicines you take and that have been prescribed to you. Take any new Medicines after you have completely understood and accept all the possible adverse reactions/side effects. Wear Seat belts while driving. If you have smoked or chewed Tobacco in the last 2 yrs please stop smoking and/or stop any Recreational drug use.   Increase activity slowly    Complete by:  As directed      Current Discharge Medication List    START taking these medications   Details  polyethylene glycol (MIRALAX / GLYCOLAX) packet Take 17 g by mouth 2 (two) times daily. Qty: 14 each, Refills: 0    senna-docusate (SENOKOT-S) 8.6-50 MG tablet Take 1 tablet by mouth 2 (two) times daily. Qty: 30 tablet, Refills: 0    torsemide (DEMADEX) 20 MG tablet Take 1 tablet (20 mg total) by mouth daily.      CONTINUE these medications which have NOT CHANGED   Details  alendronate (FOSAMAX) 70 MG/75ML solution Take 70 mg by mouth every Saturday. Take on an empty stomach with a full glass of fluids, remain upright 30 minutes after administration - for bone health    amiodarone (PACERONE) 100 MG tablet Take 100 mg by mouth daily.    atorvastatin (LIPITOR) 10 MG tablet Take 10 mg by mouth daily at 6 PM.     Biotin 5000 MCG CAPS Take 5,000 mcg by mouth  daily at 6 PM.    bisacodyl (DULCOLAX) 10 MG suppository Place 10 mg rectally daily as needed (constipation).     calcitRIOL (ROCALTROL) 0.25 MCG capsule Take 0.25 mcg by mouth daily.     carvedilol (COREG) 3.125 MG tablet Take 1 tablet (3.125 mg total) by mouth 2 (two) times daily with a meal.   Associated Diagnoses: Essential hypertension    cholecalciferol (VITAMIN D) 1000 units tablet Take 2,000 Units by mouth at bedtime.    cycloSPORINE (RESTASIS) 0.05 % ophthalmic emulsion Place 1 drop into both eyes 2 (two) times daily.     donepezil (ARICEPT) 10 MG tablet Take 10 mg by mouth at bedtime.     DULoxetine (CYMBALTA) 60 MG capsule Take 60 mg by mouth daily at 6 PM.     esomeprazole (NEXIUM) 40 MG capsule Take 40 mg by mouth daily.     febuxostat (ULORIC) 40 MG tablet Take 40 mg by mouth daily at 6 PM.    ferrous sulfate 325 (65 FE) MG tablet Take 325 mg by mouth daily.     hydrALAZINE (APRESOLINE) 10 MG tablet Take 1 tablet (10 mg  total) by mouth 3 (three) times daily.    ipratropium-albuterol (DUONEB) 0.5-2.5 (3) MG/3ML SOLN Take 3 mLs by nebulization every 6 (six) hours as needed (wheezing/shortness of breath).    levothyroxine (SYNTHROID, LEVOTHROID) 112 MCG tablet Take 112 mcg by mouth daily.     Linaclotide (LINZESS) 145 MCG CAPS capsule Take 145 mcg by mouth daily.     memantine (NAMENDA) 5 MG tablet Take 5 mg by mouth 2 (two) times daily.     Multiple Vitamin (MULTIVITAMIN WITH MINERALS) TABS tablet Take 1 tablet by mouth daily.    nitroGLYCERIN (NITROSTAT) 0.4 MG SL tablet Place 0.4 mg under the tongue every 5 (five) minutes as needed for chest pain (MAX 3 TABLETS).     pramoxine (SARNA SENSITIVE) 1 % LOTN Apply 1 application topically daily as needed (itching).     pregabalin (LYRICA) 50 MG capsule Take 1 capsule (50 mg total) by mouth 2 (two) times daily as needed (pain).    simethicone (MYLICON) 80 MG chewable tablet Chew 80 mg by mouth every 12 (twelve) hours  as needed for flatulence.     vitamin B-12 (CYANOCOBALAMIN) 1000 MCG tablet Take 1,000 mcg by mouth 2 (two) times a week. Tuesday and Friday    warfarin (COUMADIN) 2 MG tablet Take 1 tablet (2 mg total) by mouth daily at 6 PM. Qty: 15 tablet, Refills: 0       Allergies  Allergen Reactions  . Nubain [Nalbuphine Hcl] Hives    Went into cardiac arrest   . Codeine Hives and Nausea Only    Has tolerated Oxycodone  . Darvocet [Propoxyphene N-Acetaminophen] Nausea Only    Contact information for follow-up providers    PANDEY, MAHIMA, MD. Schedule an appointment as soon as possible for a visit in 1 week(s).   Specialty:  Internal Medicine Why:  with BMP Contact information: [address] [place] [address] [phone]            Contact information for after-discharge care    Destination    [facility] Follow up.   Specialty:  Skilled Nursing Facility Contact information: [address] [place] [address] [phone]                   The results of significant diagnostics from this hospitalization (including imaging, microbiology, ancillary and laboratory) are listed below for reference.    Significant Diagnostic Studies: Dg Chest 1 View  Result Date: 07/14/2016 CLINICAL DATA:  Status post fall, with right-sided chest pain and right shoulder bruising. Initial encounter. EXAM: CHEST 1 VIEW COMPARISON:  Chest radiograph performed 06/25/2016 FINDINGS: The lungs are mildly hypoexpanded, with minimal left basilar atelectasis. Mild peribronchial thickening is noted. There is no evidence of pleural effusion or pneumothorax. The cardiomediastinal silhouette is mildly enlarged. A pacemaker is noted overlying the right chest wall, with leads ending overlying the right atrium and right ventricle. A valve replacement is noted. No acute osseous abnormalities are seen. IMPRESSION: 1. No displaced rib fracture seen. 2. Lungs mildly hypoexpanded, with  minimal left basilar atelectasis. Mild peribronchial thickening seen. 3. Mild cardiomegaly. Electronically Signed   By: Roanna Raider M.D.   On: 07/14/2016 18:27   Dg Chest 2 View  Result Date: 07/24/2016 CLINICAL DATA:  Shortness of Breath EXAM: CHEST  2 VIEW COMPARISON:  July 22, 2016 FINDINGS: There is mild bibasilar atelectasis, slightly greater on the left than on the right. There is no frank edema or consolidation. Heart is mildly enlarged  with pulmonary vascularity within normal limits. Patient is status post aortic valve replacement. Pacemaker leads are attached the right atrium and right ventricle. No pneumothorax. There is aortic atherosclerosis. No evident adenopathy. There is degenerative change in each shoulder. IMPRESSION: Bibasilar atelectasis, slightly more on the left than on the right. No frank edema or consolidation. Stable cardiac enlargement. Aortic atherosclerosis. Electronically Signed   By: Bretta Bang III M.D.   On: 07/24/2016 09:51   Dg Shoulder Right  Result Date: 07/14/2016 CLINICAL DATA:  Status post fall from standing position, with right shoulder bruising. Initial encounter. EXAM: RIGHT SHOULDER - 2+ VIEW COMPARISON:  None. FINDINGS: There is no evidence of fracture or dislocation. The right humeral head is seated within the glenoid fossa. Minimal degenerative change is noted about the glenohumeral joint. Degenerative change is seen at the right acromioclavicular joint. There is calcification of the right rotator cuff. A right-sided pacemaker is noted. The visualized portions of the right lung are grossly clear. IMPRESSION: 1. No evidence of fracture or dislocation. 2. Degenerative change at the right acromioclavicular joint. 3. Calcification at the right rotator cuff. Electronically Signed   By: Roanna Raider M.D.   On: 07/14/2016 18:34   Dg Wrist Complete Left  Result Date: 07/14/2016 CLINICAL DATA:  Had unwitnessed fall today. Not alert enough to localize her pain  but specified some anterior wrist pain on left wrist and some lateral wrist pain on right wrist. EXAM: LEFT WRIST - COMPLETE 3+ VIEW COMPARISON:  None. FINDINGS: Generalized osteopenia. No acute fracture or dislocation. Mild radiocarpal osteoarthritis. Chondrocalcinosis of the TFCC as can be seen with CPPD. Mild osteoarthritis of the first [facility] joint and first MCP joint. IMPRESSION: No acute osseous injury of the left wrist. Electronically Signed   By: Elige Ko   On: 07/14/2016 21:11   Dg Wrist Complete Right  Result Date: 07/14/2016 CLINICAL DATA:  Status post fall, right wrist pain EXAM: RIGHT WRIST - COMPLETE 3+ VIEW COMPARISON:  None. FINDINGS: Generalized osteopenia. No acute fracture or dislocation. Mild osteoarthritis of the first CMC joint. Moderate osteoarthritis of the first MCP joint. Chondrocalcinosis of the right TFCC as can be seen with CPPD. IMPRESSION: No acute osseous injury of the right wrist. Electronically Signed   By: Elige Ko   On: 07/14/2016 21:13   Ct Head Wo Contrast  Result Date: 07/15/2016 CLINICAL DATA:  Unwitnessed fall yesterday.  Headache. EXAM: CT HEAD WITHOUT CONTRAST TECHNIQUE: Contiguous axial images were obtained from the base of the skull through the vertex without intravenous contrast. COMPARISON:  07/14/2016 FINDINGS: Brain: Old lacunar infarct in the left basal ganglia. Mild generalized atrophy. No acute intracranial abnormality. Specifically, no hemorrhage, hydrocephalus, mass lesion, acute infarction, or significant intracranial injury. Vascular: No hyperdense vessel or unexpected calcification. Skull: No acute calvarial abnormality. Sinuses/Orbits: Visualized paranasal sinuses and mastoids clear. Orbital soft tissues unremarkable. Other: None IMPRESSION: Mild atrophy. Old left lacunar basal ganglia infarct. No acute intracranial abnormality. Electronically Signed   By: Charlett Nose M.D.   On: 07/15/2016 09:01   Ct Head Wo Contrast  Result Date:  07/14/2016 CLINICAL DATA:  Larey Seat at home today, large bump on LEFT side of head, on blood thinners EXAM: CT HEAD WITHOUT CONTRAST CT CERVICAL SPINE WITHOUT CONTRAST TECHNIQUE: Multidetector CT imaging of the head and cervical spine was performed following the standard protocol without intravenous contrast. Multiplanar CT image reconstructions of the cervical spine were also generated. COMPARISON:  CT head 05/24/2013 FINDINGS: CT HEAD FINDINGS Brain: Generalized  atrophy. Normal ventricular morphology. No midline shift or mass effect. Old LEFT basal ganglia lacunar infarct. No intracranial hemorrhage, mass lesion or evidence acute infarction. No extra-axial fluid collections. Vascular: Atherosclerotic calcifications at the carotid bifurcations. Skull: Intact.  LEFT frontal scalp hematoma noted Sinuses/Orbits: Clear visualized mastoid air cells and paranasal sinuses Other: N/A CT CERVICAL SPINE FINDINGS Alignment: Minimal retrolisthesis at C3-C4 and C4-C5 likely degenerative. Remaining alignments normal. Skull base and vertebrae: Osseous demineralization. Vertebral body heights maintained. No fracture or bone destruction. Mild scattered facet degenerative changes. Visualized skullbase intact. Moderate calcified pannus adjacent to the odontoid process. Soft tissues and spinal canal: Prevertebral soft tissues normal thickness. Dense atherosclerotic calcifications at the carotid bifurcations and at the proximal great vessels. Spinal canal grossly patent. Disc levels: Multilevel disc space narrowing and endplate spur formation C3-C4 through C6-C7. No obvious disc herniation. Upper chest: Minimal infiltrate or scarring at RIGHT apex. Other: RIGHT subclavian pacemaker leads noted. IMPRESSION: Generalized atrophy. Old LEFT basal ganglia lacunar infarct. No acute intracranial abnormalities. Degenerative disc and facet disease changes cervical spine. No acute cervical spine abnormalities. Minimal infiltrate versus scarring at  RIGHT apex. Atherosclerotic calcifications throughout the carotid systems bilaterally. Electronically Signed   By: Ulyses Southward M.D.   On: 07/14/2016 17:46   Ct Cervical Spine Wo Contrast  Result Date: 07/14/2016 CLINICAL DATA:  Larey Seat at home today, large bump on LEFT side of head, on blood thinners EXAM: CT HEAD WITHOUT CONTRAST CT CERVICAL SPINE WITHOUT CONTRAST TECHNIQUE: Multidetector CT imaging of the head and cervical spine was performed following the standard protocol without intravenous contrast. Multiplanar CT image reconstructions of the cervical spine were also generated. COMPARISON:  CT head 05/24/2013 FINDINGS: CT HEAD FINDINGS Brain: Generalized atrophy. Normal ventricular morphology. No midline shift or mass effect. Old LEFT basal ganglia lacunar infarct. No intracranial hemorrhage, mass lesion or evidence acute infarction. No extra-axial fluid collections. Vascular: Atherosclerotic calcifications at the carotid bifurcations. Skull: Intact.  LEFT frontal scalp hematoma noted Sinuses/Orbits: Clear visualized mastoid air cells and paranasal sinuses Other: N/A CT CERVICAL SPINE FINDINGS Alignment: Minimal retrolisthesis at C3-C4 and C4-C5 likely degenerative. Remaining alignments normal. Skull base and vertebrae: Osseous demineralization. Vertebral body heights maintained. No fracture or bone destruction. Mild scattered facet degenerative changes. Visualized skullbase intact. Moderate calcified pannus adjacent to the odontoid process. Soft tissues and spinal canal: Prevertebral soft tissues normal thickness. Dense atherosclerotic calcifications at the carotid bifurcations and at the proximal great vessels. Spinal canal grossly patent. Disc levels: Multilevel disc space narrowing and endplate spur formation C3-C4 through C6-C7. No obvious disc herniation. Upper chest: Minimal infiltrate or scarring at RIGHT apex. Other: RIGHT subclavian pacemaker leads noted. IMPRESSION: Generalized atrophy. Old LEFT  basal ganglia lacunar infarct. No acute intracranial abnormalities. Degenerative disc and facet disease changes cervical spine. No acute cervical spine abnormalities. Minimal infiltrate versus scarring at RIGHT apex. Atherosclerotic calcifications throughout the carotid systems bilaterally. Electronically Signed   By: Ulyses Southward M.D.   On: 07/14/2016 17:46   Dg Chest Port 1 View  Result Date: 07/22/2016 CLINICAL DATA:  Respiratory failure, asthma, atrial fibrillation, history of previous aortic valve replacement, chronic renal insufficiency. EXAM: PORTABLE CHEST 1 VIEW COMPARISON:  Portable chest x-ray of July 21, 2016 FINDINGS: The lungs are reasonably well inflated. The interstitium has cleared somewhat. There is persistent left basilar atelectasis or pneumonia with small left pleural effusion. The cardiac silhouette remains enlarged. The pulmonary vascularity is less engorged. The ICD is in stable position. There is calcification in the  wall of the thoracic aorta. The prosthetic aortic valve cage is stable in appearance. IMPRESSION: Improved appearance of the pulmonary interstitium consistent with resolving pulmonary edema. Persistent left lower lobe atelectasis or pneumonia. Thoracic aortic atherosclerosis. Electronically Signed   By: David  [place] M.D.   On: 07/22/2016 07:17   Dg Chest Portable 1 View  Result Date: 07/21/2016 CLINICAL DATA:  Shortness of breath, cardiomyopathy, atrial fibrillation EXAM: PORTABLE CHEST 1 VIEW COMPARISON:  Portable chest x-ray of July 14, 2016 FINDINGS: The lungs are adequately inflated. The interstitial markings are increased and more conspicuous than on the previous study. There is increased density at the left lung base with obscuration of the hemidiaphragm. The cardiac silhouette is mildly enlarged. The pulmonary vascularity is engorged. A prosthetic aortic valve cage remains visible. There is calcification in the wall of the aortic arch. The ICD is in stable  position. IMPRESSION: Worsening of the pulmonary interstitium consistent with edema. There is bilateral pulmonary vascular congestion. Persistent left lower lobe atelectasis or pneumonia. Thoracic aortic atherosclerosis. Electronically Signed   By: David  [place] M.D.   On: 07/21/2016 07:05   Dg Abd Portable 1v  Result Date: 07/30/2016 CLINICAL DATA:  Left lower quadrant pain EXAM: PORTABLE ABDOMEN - 1 VIEW COMPARISON:  Abdominal and pelvic CT scan of November 10, 2012 FINDINGS: The colonic stool burden is increased diffusely. There is no small or large bowel obstructive pattern. There is a moderate amount of stool in the rectum. There is severe degenerative disc disease at all lumbar levels as well as in the lower thoracic spine. An inferior vena caval filter is present at the L3-4 level to the right of midline. There is an old fracture of the very lateral aspect of the right tenth rib. IMPRESSION: Increase colonic stool burden may reflect constipation in the appropriate clinical setting. There is no evidence of ileus or obstruction. Severe degenerative disc disease of the lumbar spine. Electronically Signed   By: David  [place] M.D.   On: 07/30/2016 12:23    Microbiology: Recent Results (from the past 240 hour(s))  Blood Culture (routine x 2)     Status: Abnormal   Collection Time: 07/21/16  6:50 AM  Result Value Ref Range Status   Specimen Description BLOOD LEFT ANTECUBITAL  Final   Special Requests BOTTLES DRAWN AEROBIC AND ANAEROBIC 5CC  Final   Culture  Setup Time   Final    ANAEROBIC BOTTLE ONLY GRAM POSITIVE RODS CRITICAL RESULT CALLED TO, READ BACK BY AND VERIFIED WITH: V BRYK,PHARMD AT 0712 07/26/16 BY L BENFIELD    Culture (A)  Final    DIPHTHEROIDS(CORYNEBACTERIUM SPECIES) Standardized susceptibility testing for this organism is not available.    Report Status 07/28/2016 FINAL  Final  Blood Culture ID Panel (Reflexed)     Status: None   Collection Time: 07/21/16  6:50 AM  Result Value  Ref Range Status   Enterococcus species NOT DETECTED NOT DETECTED Final   Listeria monocytogenes NOT DETECTED NOT DETECTED Final   Staphylococcus species NOT DETECTED NOT DETECTED Final   Staphylococcus aureus NOT DETECTED NOT DETECTED Final   Streptococcus species NOT DETECTED NOT DETECTED Final   Streptococcus agalactiae NOT DETECTED NOT DETECTED Final   Streptococcus pneumoniae NOT DETECTED NOT DETECTED Final   Streptococcus pyogenes NOT DETECTED NOT DETECTED Final   Acinetobacter baumannii NOT DETECTED NOT DETECTED Final   Enterobacteriaceae species NOT DETECTED NOT DETECTED Final   Enterobacter cloacae complex NOT DETECTED NOT DETECTED Final   Escherichia coli NOT  DETECTED NOT DETECTED Final   Klebsiella oxytoca NOT DETECTED NOT DETECTED Final   Klebsiella pneumoniae NOT DETECTED NOT DETECTED Final   Proteus species NOT DETECTED NOT DETECTED Final   Serratia marcescens NOT DETECTED NOT DETECTED Final   Haemophilus influenzae NOT DETECTED NOT DETECTED Final   Neisseria meningitidis NOT DETECTED NOT DETECTED Final   Pseudomonas aeruginosa NOT DETECTED NOT DETECTED Final   Candida albicans NOT DETECTED NOT DETECTED Final   Candida glabrata NOT DETECTED NOT DETECTED Final   Candida krusei NOT DETECTED NOT DETECTED Final   Candida parapsilosis NOT DETECTED NOT DETECTED Final   Candida tropicalis NOT DETECTED NOT DETECTED Final  Blood Culture (routine x 2)     Status: None   Collection Time: 07/21/16  7:57 AM  Result Value Ref Range Status   Specimen Description BLOOD RIGHT HAND ARPEDB  Final   Special Requests 2CC  Final   Culture NO GROWTH 5 DAYS  Final   Report Status 07/26/2016 FINAL  Final  Respiratory Panel by PCR     Status: None   Collection Time: 07/21/16  4:02 PM  Result Value Ref Range Status   Adenovirus NOT DETECTED NOT DETECTED Final   Coronavirus 229E NOT DETECTED NOT DETECTED Final   Coronavirus HKU1 NOT DETECTED NOT DETECTED Final   Coronavirus NL63 NOT DETECTED  NOT DETECTED Final   Coronavirus OC43 NOT DETECTED NOT DETECTED Final   Metapneumovirus NOT DETECTED NOT DETECTED Final   Rhinovirus / Enterovirus NOT DETECTED NOT DETECTED Final   Influenza A NOT DETECTED NOT DETECTED Final   Influenza A H1 NOT DETECTED NOT DETECTED Final   Influenza A H1 2009 NOT DETECTED NOT DETECTED Final   Influenza A H3 NOT DETECTED NOT DETECTED Final   Influenza B NOT DETECTED NOT DETECTED Final   Parainfluenza Virus 1 NOT DETECTED NOT DETECTED Final   Parainfluenza Virus 2 NOT DETECTED NOT DETECTED Final   Parainfluenza Virus 3 NOT DETECTED NOT DETECTED Final   Parainfluenza Virus 4 NOT DETECTED NOT DETECTED Final   Respiratory Syncytial Virus NOT DETECTED NOT DETECTED Final   Bordetella pertussis NOT DETECTED NOT DETECTED Final   Chlamydophila pneumoniae NOT DETECTED NOT DETECTED Final   Mycoplasma pneumoniae NOT DETECTED NOT DETECTED Final     Labs: Basic Metabolic Panel:  Recent Labs Lab 07/26/16 0417 07/27/16 0218 07/28/16 0838 07/29/16 0813 07/30/16 0111  NA 140 139 138 140 137  K 3.9 4.5 4.1 4.3 4.3  CL 97* 101 97* 101 99*  CO2 31 31 28 29 29   GLUCOSE 86 102* 93 96 102*  BUN 30* 41* 41* 40* 46*  CREATININE 1.86* 2.59* 2.20* 2.25* 2.46*  CALCIUM 8.4* 8.9 9.1 9.2 8.5*   Liver Function Tests:  Recent Labs Lab 07/24/16 0216  AST 19  ALT 14  ALKPHOS 56  BILITOT 0.7  PROT 5.3*  ALBUMIN 2.9*   No results for input(s): LIPASE, AMYLASE in the last 168 hours. No results for input(s): AMMONIA in the last 168 hours. CBC:  Recent Labs Lab 07/24/16 0216 07/25/16 0443  WBC 7.4 6.5  HGB 9.8* 9.5*  HCT 30.8* 30.3*  MCV 94.5 95.9  PLT 175 191   Cardiac Enzymes: No results for input(s): CKTOTAL, CKMB, CKMBINDEX, TROPONINI in the last 168 hours. BNP: BNP (last 3 results)  Recent Labs  06/13/16 0530 07/21/16 0627  BNP 528.3* 1,636.9*    ProBNP (last 3 results) No results for input(s): PROBNP in the last 8760  hours.  CBG:  Recent Labs Lab 07/28/16 1213  GLUCAP 102*    Signed:  Lynden Oxford MD.  [facility] 07/30/2016, 12:58 PM

## 2016-07-30 NOTE — Progress Notes (Signed)
ANTICOAGULATION CONSULT NOTE  Pharmacy Consult for warfarin Indication: atrial fibrillation   Allergies  Allergen Reactions  . Nubain [Nalbuphine Hcl] Hives    Went into cardiac arrest   . Codeine Hives and Nausea Only    Has tolerated Oxycodone  . Darvocet [Propoxyphene N-Acetaminophen] Nausea Only    Patient Measurements: Height: 4\' 10"  (147.3 cm) Weight: 174 lb 6.4 oz (79.1 kg) IBW/kg (Calculated) : 40.9  Vital Signs: Temp: 97.8 F (36.6 C) (04/11 0500) Temp Source: Oral (04/11 0500) BP: 159/65 (04/11 0500) Pulse Rate: 85 (04/11 0500)  Labs:  Recent Labs  07/28/16 0551 07/28/16 0838 07/29/16 0813 07/30/16 0111  LABPROT 25.3*  --  26.3* 27.7*  INR 2.26  --  2.36 2.53  CREATININE  --  2.20* 2.25* 2.46*    Estimated Creatinine Clearance: 15.6 mL/min (A) (by C-G formula based on SCr of 2.46 mg/dL (H)).   Assessment: 30 yof with hx of afib on warfarin PTA. Also with hx DVT/PE noted. Continues on warfarin.  Levofloxacin now complete  INR = 2.53 today  PTA warfarin dose: 1.5mg  daily (INR low on admission)  Goal of Therapy:  INR 2-3 Monitor platelets by anticoagulation protocol: Yes   Plan:  Warfarin 2 mg po daily Daily INR   Thank you Okey Regal, PharmD [phone] 07/30/16 8:50 AM

## 2016-07-30 NOTE — Progress Notes (Signed)
Called [facility] at [phone], where [facility] answered phone.  She transferred me so that I may give report to receiving nurse, however, no one ever answered the phone, and I was unable to give report. IV and telemetry have been removed, and CCMD notified. AVS has been printed and placed in white envelope to be given to [facility] for transport. Pt is in no acute distress and ready for transport.

## 2016-07-31 ENCOUNTER — Non-Acute Institutional Stay (SKILLED_NURSING_FACILITY): Payer: Medicare Other | Admitting: Internal Medicine

## 2016-07-31 ENCOUNTER — Encounter: Payer: Self-pay | Admitting: Internal Medicine

## 2016-07-31 ENCOUNTER — Telehealth: Payer: Self-pay

## 2016-07-31 DIAGNOSIS — F0280 Dementia in other diseases classified elsewhere without behavioral disturbance: Secondary | ICD-10-CM

## 2016-07-31 DIAGNOSIS — J9621 Acute and chronic respiratory failure with hypoxia: Secondary | ICD-10-CM

## 2016-07-31 DIAGNOSIS — K219 Gastro-esophageal reflux disease without esophagitis: Secondary | ICD-10-CM

## 2016-07-31 DIAGNOSIS — Z952 Presence of prosthetic heart valve: Secondary | ICD-10-CM

## 2016-07-31 DIAGNOSIS — N184 Chronic kidney disease, stage 4 (severe): Secondary | ICD-10-CM

## 2016-07-31 DIAGNOSIS — I480 Paroxysmal atrial fibrillation: Secondary | ICD-10-CM

## 2016-07-31 DIAGNOSIS — R531 Weakness: Secondary | ICD-10-CM

## 2016-07-31 DIAGNOSIS — J690 Pneumonitis due to inhalation of food and vomit: Secondary | ICD-10-CM

## 2016-07-31 DIAGNOSIS — G301 Alzheimer's disease with late onset: Secondary | ICD-10-CM

## 2016-07-31 DIAGNOSIS — I5032 Chronic diastolic (congestive) heart failure: Secondary | ICD-10-CM

## 2016-07-31 NOTE — Progress Notes (Signed)
Cardiology Office Note    Date:  08/05/2016   ID:  [name], DOB 1932-05-06, MRN [id number]  PCP:  Oneal Grout, MD  Cardiologist: Dr. Excell Seltzer EP: Dr. Ladona Ridgel  Chief Complaint: Hospital follow up for CHF  History of Present Illness:   [name] is a 83 y.o. female w/ hx NICM, chronic systolic heart failure, and severe AS s/p TAVR 12/21/2012 w/ 29 mm Edwards Sapien XT valve, paroxysmal atrial fibrillation, HTN, CKD stage IV, oxygen dependent chronic respiratory failure, chronic anticoagulation w/ warfarin s/p PPM by Dr. Ladona Ridgel presents for hospital follow up.   She lives at [facility]. Admitted 02/23 with acute respiratory failure insetting of acute Heart failure. Symptoms improved with diuresis. Intermittently required hydration. Also holding of Torsemide due to elevated creatine.  Echo showed newly depressed EF of 35-45% with WM abnormality. Nuc was high risk but no reversible defect to suggest ischemia. She diuresed 8.1 L. Dr. Excell Seltzer recommended medical therapy.   Seen by me clinic 07/08/16 for hospital follow up. She gained weight from discharge. Increase/restarted Torsemide. Scr improved.   She was admitted 3/26-3/29 with unwitnessed fall with head contusion. CT of head without abnormality. Hold diuretics, BB and Imdur due to hypotension. Coumadin continued.   She was admitted again 4/2-4/11 acute on chronic hypoxic respiratory failure in setting of PNA and CHF exacerbation while off diuretics.  Treated with abx. Symptoms improved with diuresis however Scr worsen required holding of Torsemide. Volume status is very challenging to manage with CHF/CKF and intermittent Orthostatic symptoms. She was discharged on low dose torsemide 20mg  qd.   Here today for follow up. She is coming from [facility] with her son. Son has contacted Dr. Allena Katz (nephrologist) yesterday. He did order some blood work and has an appointment May 1. Patient has a brief dialysis many years ago when  she had a acute kidney failure. Patient has had intermittent chest tightness along with dyspnea on exertion. This has been stable. Hasn't followed with Dr. Lubertha Basque or check device the past 2 years due to multiple admission and surgeries. No orthopnea or PND. Mild lower extremity edema.  Past Medical History:  Diagnosis Date  . Acute on chronic renal failure [facility])    sees Dr Allena Katz   . Anemia    Acute blood loss  . Aortic regurgitation   . Aortic stenosis 10/13/2012   Low EF, low gradient with severe aortic stenosis confirmed by dobutamine stress echocardiogram s/p TAVR 12/2012  . Asthma   . Atrial fibrillation (HCC)    tachy-brady syndrome with <1% recurrent PAF since pacemaker placement  . Cataracts, bilateral   . Chronic combined systolic and diastolic CHF (congestive heart failure) (HCC)   . Chronic lower back pain   . CKD (chronic kidney disease)   . Coronary artery disease involving native coronary artery of native heart   . Dementia    Without behavioral disturbance  . Dysrhythmia   . Fibromyalgia   . Gastroesophageal reflux disease   . H/O dizziness   . H/O urinary frequency   . H/O: stroke   . Hard of hearing   . Headache    hx of migraines   . Heart murmur   . History of blood transfusion   . History of bronchitis   . History of kidney stones   . History of urinary tract infection   . HLD (hyperlipidemia)   . HTN (hypertension)   . Hypothyroidism   . Neuropathy   . Nonischemic  cardiomyopathy (HCC)   . On home oxygen therapy    patient uses at nite- 2L- has not used in > 6 months per patient  . Osteoarthritis   . Osteoporosis   . Pneumonia    hx of x 3   . PONV (postoperative nausea and vomiting)   . Presence of permanent cardiac pacemaker   . Pulmonary embolism (HCC)    HISTORY OF, the pt. had a recurrent bilateral pulmonary emboli in 2005, on warfarin therapy and at which time she under went implantation of IVC filter  . Repeated falls   . Rupture of right  patellar tendon   . Sciatica   . Shortness of breath dyspnea    with exertion   . Sleep apnea    uses oxygen at night and PRN- not used since > 6 months / DOES NOT USE  C-PAP  . Spinal stenosis   . Stroke (HCC)   . Symptomatic bradycardia   . Symptomatic bradycardia 2012   s/p Medtronic PPM  . Syncope   . Urinary incontinence   . Urinary urgency     Past Surgical History:  Procedure Laterality Date  . ABDOMINAL HYSTERECTOMY    . APPENDECTOMY    . BACK SURGERY    . BUNIONECTOMY    . CARDIAC CATHETERIZATION    . CATARACT EXTRACTION    . CENTRAL VENOUS CATHETER INSERTION Left 12/21/2012   Procedure: INSERTION CENTRAL LINE ADULT;  Surgeon: Tonny Bollman, MD;  Location: [facility] OR;  Service: Open Heart Surgery;  Laterality: Left;  . ELBOW SURGERY     bilat   . INTRAOPERATIVE TRANSESOPHAGEAL ECHOCARDIOGRAM N/A 12/21/2012   Procedure: INTRAOPERATIVE TRANSESOPHAGEAL ECHOCARDIOGRAM;  Surgeon: Tonny Bollman, MD;  Location: [facility] OR;  Service: Open Heart Surgery;  Laterality: N/A;  . KNEE SURGERY Left   . LEFT AND RIGHT HEART CATHETERIZATION WITH CORONARY ANGIOGRAM N/A 10/18/2012   Procedure: LEFT AND RIGHT HEART CATHETERIZATION WITH CORONARY ANGIOGRAM;  Surgeon: Tonny Bollman, MD;  Location: [facility] CATH LAB;  Service: Cardiovascular;  Laterality: N/A;  . NASAL SEPTUM SURGERY    . NOSE SURGERY     X 2  . ORIF PATELLA Right 03/08/2015   Procedure:  OPEN REDUCTION INTERNAL FIXATION RIGHT  PATELLA TENDON AVULSION;  Surgeon: Durene Romans, MD;  Location: [facility] ORS;  Service: Orthopedics;  Laterality: Right;  . PACEMAKER INSERTION     Medtronic  . PATELLAR TENDON REPAIR Right 06/25/2015   Procedure: RIGHT PATELLA TENDON REVISION/REPAIR;  Surgeon: Durene Romans, MD;  Location: [facility] ORS;  Service: Orthopedics;  Laterality: Right;  . SHOULDER SURGERY     bilat   . THYROIDECTOMY, PARTIAL    . TONSILLECTOMY    . TOTAL KNEE ARTHROPLASTY Right 12/04/2014   Procedure: RIGHT TOTAL  KNEE ARTHROPLASTY;  Surgeon: Durene Romans, MD;  Location: [facility] ORS;  Service: Orthopedics;  Laterality: Right;  . TRANSCATHETER AORTIC VALVE REPLACEMENT, TRANSFEMORAL  12/21/2012   a. 29mm Edwards Sapien XT transcatheter heart valve placed via open left transfemoral approach b. Intra-op TEE: well-seated bioprosthetic aortic valve with mean gradient 2 mmHg, trivial AI, mild MR, EF 30-35%  . TRANSCATHETER AORTIC VALVE REPLACEMENT, TRANSFEMORAL N/A 12/21/2012   Procedure: TRANSCATHETER AORTIC VALVE REPLACEMENT, TRANSFEMORAL;  Surgeon: Tonny Bollman, MD;  Location: [facility] OR;  Service: Open Heart Surgery;  Laterality: N/A;    Current Medications: Prior to Admission medications   Medication Sig Start Date End Date Taking? Authorizing Provider  alendronate (FOSAMAX) 70 MG/75ML solution Take 70 mg by mouth every  Saturday. Take on an empty stomach with a full glass of fluids, remain upright 30 minutes after administration - for bone health. Give 75 mL    Historical Provider, MD  amiodarone (PACERONE) 100 MG tablet Take 100 mg by mouth daily.    Historical Provider, MD  atorvastatin (LIPITOR) 10 MG tablet Take 10 mg by mouth daily at 6 PM.     Historical Provider, MD  Biotin 5000 MCG CAPS Take 5,000 mcg by mouth daily at 6 PM.    Historical Provider, MD  bisacodyl (DULCOLAX) 10 MG suppository Place 10 mg rectally daily as needed (constipation).     Historical Provider, MD  calcitRIOL (ROCALTROL) 0.25 MCG capsule Take 0.25 mcg by mouth daily.     Historical Provider, MD  carvedilol (COREG) 3.125 MG tablet Take 1 tablet (3.125 mg total) by mouth 2 (two) times daily with a meal. 07/17/16   Penny Pia, MD  cholecalciferol (VITAMIN D) 1000 units tablet Take 2,000 Units by mouth at bedtime.    Historical Provider, MD  cycloSPORINE (RESTASIS) 0.05 % ophthalmic emulsion Place 1 drop into both eyes 2 (two) times daily.     Historical Provider, MD  donepezil (ARICEPT) 10 MG tablet Take 10 mg by mouth at bedtime.     Historical Provider, MD  DULoxetine  (CYMBALTA) 60 MG capsule Take 60 mg by mouth daily at 6 PM.     Historical Provider, MD  esomeprazole (NEXIUM) 40 MG capsule Take 40 mg by mouth daily.     Historical Provider, MD  febuxostat (ULORIC) 40 MG tablet Take 40 mg by mouth daily at 6 PM.    Historical Provider, MD  ferrous sulfate 325 (65 FE) MG tablet Take 325 mg by mouth daily.     Historical Provider, MD  hydrALAZINE (APRESOLINE) 10 MG tablet Take 1 tablet (10 mg total) by mouth 3 (three) times daily. 07/17/16   Penny Pia, MD  ipratropium-albuterol (DUONEB) 0.5-2.5 (3) MG/3ML SOLN Take 3 mLs by nebulization every 6 (six) hours as needed (wheezing/shortness of breath).    Historical Provider, MD  levothyroxine (SYNTHROID, LEVOTHROID) 112 MCG tablet Take 112 mcg by mouth daily.     Historical Provider, MD  Linaclotide Karlene Einstein) 145 MCG CAPS capsule Take 145 mcg by mouth daily.     Historical Provider, MD  memantine (NAMENDA) 5 MG tablet Take 5 mg by mouth 2 (two) times daily.     Historical Provider, MD  Multiple Vitamin (MULTIVITAMIN WITH MINERALS) TABS tablet Take 1 tablet by mouth daily.    Historical Provider, MD  nitroGLYCERIN (NITROSTAT) 0.4 MG SL tablet Place 0.4 mg under the tongue every 5 (five) minutes as needed for chest pain (MAX 3 TABLETS).     Historical Provider, MD  polyethylene glycol (MIRALAX / GLYCOLAX) packet Take 17 g by mouth 2 (two) times daily. 07/30/16   Rolly Salter, MD  pramoxine (SARNA SENSITIVE) 1 % LOTN Apply 1 application topically as needed (itching).     Historical Provider, MD  pregabalin (LYRICA) 50 MG capsule Take 1 capsule (50 mg total) by mouth 2 (two) times daily as needed (pain). 07/17/16   Penny Pia, MD  senna-docusate (SENOKOT-S) 8.6-50 MG tablet Take 1 tablet by mouth 2 (two) times daily. 07/30/16   Rolly Salter, MD  simethicone (MYLICON) 80 MG chewable tablet Chew 80 mg by mouth every 12 (twelve) hours as needed for flatulence.     Historical Provider, MD  torsemide (DEMADEX) 20 MG tablet  Take 1  tablet (20 mg total) by mouth daily. 07/30/16   Zannie Cove, MD  vitamin B-12 (CYANOCOBALAMIN) 1000 MCG tablet Take 1,000 mcg by mouth 2 (two) times a week. Tuesday and Friday    Historical Provider, MD  warfarin (COUMADIN) 2 MG tablet Take 1 tablet (2 mg total) by mouth daily at 6 PM. 07/17/16   Penny Pia, MD    Allergies:   Nubain [nalbuphine hcl]; Codeine; and Darvocet [propoxyphene n-acetaminophen]   Social History   Social History  . Marital status: Divorced    Spouse name: N/A  . Number of children: N/A  . Years of education: N/A   Social History Main Topics  . Smoking status: Never Smoker  . Smokeless tobacco: Never Used  . Alcohol use No  . Drug use: No  . Sexual activity: Not Currently   Other Topics Concern  . None   Social History Narrative  . None     Family History:  The patient's family history includes Epilepsy in her father; Ovarian cancer in her mother.   ROS:   Please see the history of present illness.    ROS All other systems reviewed and are negative.   PHYSICAL EXAM:   VS:  BP (!) 150/90 Comment: After excertion getting from WC to Lab Table  Pulse 83   Wt 172 lb 2 oz (78.1 kg)   SpO2 96%   BMI 35.97 kg/m    GEN: Well nourished, well developed, in no acute distress  HEENT: normal  Neck: no JVD, carotid bruits, or masses Cardiac: RRR; no murmurs, rubs, or gallops, no edema  Respiratory:  clear to auscultation bilaterally, normal work of breathing GI: soft, nontender, nondistended, + BS MS: no deformity or atrophy  Skin: warm and dry, no rash Neuro:  Alert and Oriented x 3, Strength and sensation are intact Psych: euthymic mood, full affect  Wt Readings from Last 3 Encounters:  08/05/16 172 lb 2 oz (78.1 kg)  07/31/16 174 lb 6.4 oz (79.1 kg)  07/30/16 174 lb 6.4 oz (79.1 kg)      Studies/Labs Reviewed:   EKG:  EKG is ordered today.  The ekg ordered today demonstrates A paced with TWI in inferior leads  Recent  Labs: 06/15/2016: TSH 2.673 07/21/2016: B Natriuretic Peptide 1,636.9 07/24/2016: ALT 14 07/25/2016: Hemoglobin 9.5; Platelets 191 07/30/2016: BUN 46; Creatinine, Ser 2.46; Potassium 4.3; Sodium 137   Lipid Panel    Component Value Date/Time   CHOL 153 10/01/2015   TRIG 247 (A) 10/01/2015   HDL 36 10/01/2015   CHOLHDL 1.8 06/16/2010 0415   VLDL 13 06/16/2010 0415   LDLCALC 67 10/01/2015    Additional studies/ records that were reviewed today include:   ECHO: 06/13/2016 - Left ventricle: LVEF is approximately 35 to 40% with hypokinesis of inferior, inferoseptal and anterior walls This is new compared to echo of 2016. The cavity size was moderately dilated. Wall thickness was normal. Features are consistent with a pseudonormal left ventricular filling pattern, with concomitant abnormal relaxation and increased filling pressure (grade 2 diastolic dysfunction). Doppler parameters are consistent with high ventricular filling pressure. - Aortic valve: AV prosthesis is difficult to see Peak and mean gradients through the valve are 12 and 7 mm Hg respectively. There is trace valvular AI Valve area (VTI): 1.82 cm^2. Valve area (Vmax): 1.7 cm^2. Valve area (Vmean): 1.89 cm^2. - Mitral valve: There was mild to moderate regurgitation. - Left atrium: The atrium was mildly dilated. - Pulmonary arteries: PA peak pressure:  39 mm Hg (S).  Myoview: 06/22/2016 IMPRESSION: 1. Infarction/scar involving the inferior wall and part of the apex. No reversible defects to suggest ischemia. 2. Wall motion abnormality involving the inferior wall and part of the apex. Moderate left ventricular dilatation. 3. Left ventricular ejection fraction 42% 4. Non invasive risk stratification*: High risk.     ASSESSMENT & PLAN:    1. Chronic combined CHF - Recently volume status difficult to control due to CKD, hypotension and dehydration requiring multiple admission in past 2 months.  -  EF 06/13/16  depressed to 35-40% with +WM abnormality. Volume status stable. We will continue Coreg 3.125 mg twice a day, hydralazine 10 mg 3 times a day and low-dose torsemide. Patient has a blood work done by Dr. Allena Katz yesterday and has a follow-up appointment.  - If she has a recurrent exacerbation consider CHF clinic referral.  2. NICM - Nuc 05/2016  showed no reversible defects to suggest ischemia. Plan for medical management due to CKD. Her chest tightness could be anginal versus CHF related. Will add Imdur to her regimen. TWI noted on EKG today. Not a candidate for invasive procedure currently.   3. S/p PPM - Followed by Dr. Ladona Ridgel. Check device today.   4. HTN - Elevated to 150/90. Recently difficult to control. Medication changes as above.   5. CKD stage IV - Scr was 2.46 at discharge. Seems baseline Scr around 1.6-1.7. Has appointment with Dr. Allena Katz May 1.  6. Severe AS s/p TAVR 12/21/2012 w/ 29 mm Edwards Sapien XT valve  7. PAF  - Paced rhythm.  CHA2DS2VASc score of 8. Oncoumadin. Continue Amiodarone 100mg  qd and Coreg. TSH was normal in 05/2016. LFT's was normal at 07/24/16.  Medication Adjustments/Labs and Tests Ordered: Current medicines are reviewed at length with the patient today.  Concerns regarding medicines are outlined above.  Medication changes, Labs and Tests ordered today are listed in the Patient Instructions below. Patient Instructions  Medication Instructions:  Your physician has recommended you make the following change in your medication:  1.  START Imdur 30 mg taking 1 daily  Labwork: None ordered  Testing/Procedures: None ordered  Follow-Up: Your physician recommends that you schedule a follow-up appointment in: 1ST AVAILABLE WITH DR. Ladona Ridgel FOR PACER CHECK  Your physician recommends that you schedule a follow-up appointment in: 3 MONTHS WITH DR. Excell Seltzer   Any Other Special Instructions Will Be Listed Below (If Applicable).     If you need a  refill on your cardiac medications before your next appointment, please call your pharmacy.      Lorelei Pont, [place]  08/05/2016 12:09 PM    [facility] [address] [place], [place], [place]  [address] Phone: [phone]; Fax: [phone]

## 2016-07-31 NOTE — Progress Notes (Signed)
LOCATION: [facility]  PCP: Oneal Grout, MD   Code Status: Full Code  Goals of care: Advanced Directive information Advanced Directives 07/22/2016  Does Patient Have a Medical Advance Directive? -  Type of Advance Directive -  Does patient want to make changes to medical advance directive? -  Copy of Healthcare Power of Attorney in Chart? -  Would patient like information on creating a medical advance directive? No - Patient declined  Pre-existing out of facility DNR order (yellow form or pink MOST form) -       Extended Emergency Contact Information Primary Emergency Contact: Sacca,David Address: [address]          [place], [place] [address] [place] Home Phone: [phone] Mobile Phone: [phone] Relation: Son Secondary Emergency Contact: Gray,Sharon Address: [address]          [place], [place] [address] [place] Home Phone: [phone] Mobile Phone: [phone] Relation: Daughter   Allergies  Allergen Reactions  . Nubain [Nalbuphine Hcl] Hives    Went into cardiac arrest   . Codeine Hives and Nausea Only    Has tolerated Oxycodone  . Darvocet [Propoxyphene N-Acetaminophen] Nausea Only    Chief Complaint  Patient presents with  . Readmit To SNF    Readmission Visit      HPI:  Patient is a 83 y.o. female seen today for Long-term care post hospital readmission. She was in the hospital from 07/14/2016-07/17/2016 post fall with head contusion. CAT scan of the head was negative for acute intracranial abnormality is an bleed. She was also found to be hypotensive and required IV fluids. Her antihypertensive medications were held and dose of beta blocker had to be adjusted. Following this she was discharged and readmitted to the hospital from 07/21/2016-11th of April 2018 with acute on chronic respiratory failure from healthcare acquired pneumonia and CHF exacerbation. She required antibiotic and diuresis. She  is seen in her room today. She has medical history of atrial fibrillation, chronic diastolic CHF, dementia, status post aortic valve replacement, hyperlipidemia among others. She is a long-term care resident of this facility.  Review of Systems:  Constitutional: Negative for fever, chills, diaphoresis. Feels weak and tired. HENT: Negative for headache, congestion,sore throat, difficulty swallowing. Positive for nasal discharge.  Eyes: Negative for eye pain, blurred vision, double vision and discharge.  Respiratory: Negative for wheezing. Positive for dry cough and shortness of breath.   Cardiovascular: Negative for chest pain, palpitations, leg swelling.  Gastrointestinal: Negative for vomiting, abdominal pain, loss of appetite, melena, diarrhea and constipation. Positive for heartburn and occasional nausea. Genitourinary: Negative for dysuria and flank pain.  Musculoskeletal: Negative for back pain, fall in the facility this admission. Positive for generalized joint aches.  Skin: Negative for itching, rash.  Neurological: Negative for dizziness. Positive for neuropathy. Psychiatric/Behavioral: Negative for depression.   Past Medical History:  Diagnosis Date  . Acute on chronic renal failure [facility])    sees Dr Allena Katz   . Anemia    Acute blood loss  . Aortic regurgitation   . Aortic stenosis 10/13/2012   Low EF, low gradient with severe aortic stenosis confirmed by dobutamine stress echocardiogram s/p TAVR 12/2012  . Asthma   . Atrial fibrillation (HCC)    tachy-brady syndrome with <1% recurrent PAF since pacemaker placement  . Cataracts, bilateral   . Chronic combined systolic and diastolic CHF (congestive heart failure) (HCC)   . Chronic lower back pain   . CKD (  chronic kidney disease)   . Coronary artery disease involving native coronary artery of native heart   . Dementia    Without behavioral disturbance  . Dysrhythmia   . Fibromyalgia   . Gastroesophageal reflux disease   . H/O  dizziness   . H/O urinary frequency   . H/O: stroke   . Hard of hearing   . Headache    hx of migraines   . Heart murmur   . History of blood transfusion   . History of bronchitis   . History of kidney stones   . History of urinary tract infection   . HLD (hyperlipidemia)   . HTN (hypertension)   . Hypothyroidism   . Neuropathy (HCC)   . Nonischemic cardiomyopathy (HCC)   . On home oxygen therapy    patient uses at nite- 2L- has not used in > 6 months per patient  . Osteoarthritis   . Osteoporosis   . Pneumonia    hx of x 3   . PONV (postoperative nausea and vomiting)   . Presence of permanent cardiac pacemaker   . Pulmonary embolism (HCC)    HISTORY OF, the pt. had a recurrent bilateral pulmonary emboli in 2005, on warfarin therapy and at which time she under went implantation of IVC filter  . Repeated falls   . Rupture of right patellar tendon   . Sciatica   . Shortness of breath dyspnea    with exertion   . Sleep apnea    uses oxygen at night and PRN- not used since > 6 months / DOES NOT USE  C-PAP  . Spinal stenosis   . Stroke (HCC)   . Symptomatic bradycardia   . Symptomatic bradycardia 2012   s/p Medtronic PPM  . Syncope   . Urinary incontinence   . Urinary urgency    Past Surgical History:  Procedure Laterality Date  . ABDOMINAL HYSTERECTOMY    . APPENDECTOMY    . BACK SURGERY    . BUNIONECTOMY    . CARDIAC CATHETERIZATION    . CATARACT EXTRACTION    . CENTRAL VENOUS CATHETER INSERTION Left 12/21/2012   Procedure: INSERTION CENTRAL LINE ADULT;  Surgeon: Tonny Bollman, MD;  Location[facility];  Service: Open Heart Surgery;  Laterality: Left;  . ELBOW SURGERY     bilat   . INTRAOPERATIVE TRANSESOPHAGEAL ECHOCARDIOGRAM N/A 12/21/2012   Procedure: INTRAOPERATIVE TRANSESOPHAGEAL ECHOCARDIOGRAM;  Surgeon: Tonny Bollman, MD;  Location: [facility] OR;  Service: Open Heart Surgery;  Laterality: N/A;  . KNEE SURGERY Left   . LEFT AND RIGHT HEART CATHETERIZATION WITH CORONARY  ANGIOGRAM N/A 10/18/2012   Procedure: LEFT AND RIGHT HEART CATHETERIZATION WITH CORONARY ANGIOGRAM;  Surgeon: Tonny Bollman, MD;  Location: [facility] CATH LAB;  Service: Cardiovascular;  Laterality: N/A;  . NASAL SEPTUM SURGERY    . NOSE SURGERY     X 2  . ORIF PATELLA Right 03/08/2015   Procedure:  OPEN REDUCTION INTERNAL FIXATION RIGHT  PATELLA TENDON AVULSION;  Surgeon: Durene Romans, MD;  Location: [facility] ORS;  Service: Orthopedics;  Laterality: Right;  . PACEMAKER INSERTION     Medtronic  . PATELLAR TENDON REPAIR Right 06/25/2015   Procedure: RIGHT PATELLA TENDON REVISION/REPAIR;  Surgeon: Durene Romans, MD;  Location: [facility] ORS;  Service: Orthopedics;  Laterality: Right;  . SHOULDER SURGERY     bilat   . THYROIDECTOMY, PARTIAL    . TONSILLECTOMY    . TOTAL KNEE ARTHROPLASTY Right 12/04/2014   Procedure: RIGHT TOTAL  KNEE ARTHROPLASTY;  Surgeon: Durene Romans, MD;  Location: [facility] ORS;  Service: Orthopedics;  Laterality: Right;  . TRANSCATHETER AORTIC VALVE REPLACEMENT, TRANSFEMORAL  12/21/2012   a. 29mm Edwards Sapien XT transcatheter heart valve placed via open left transfemoral approach b. Intra-op TEE: well-seated bioprosthetic aortic valve with mean gradient 2 mmHg, trivial AI, mild MR, EF 30-35%  . TRANSCATHETER AORTIC VALVE REPLACEMENT, TRANSFEMORAL N/A 12/21/2012   Procedure: TRANSCATHETER AORTIC VALVE REPLACEMENT, TRANSFEMORAL;  Surgeon: Tonny Bollman, MD;  Location: [facility] OR;  Service: Open Heart Surgery;  Laterality: N/A;   Social History:   reports that she has never smoked. She has never used smokeless tobacco. She reports that she does not drink alcohol or use drugs.  Family History  Problem Relation Age of Onset  . Ovarian cancer Mother     Deceased  . Epilepsy Father     Deceased    Medications: Allergies as of 07/31/2016      Reactions   Nubain [nalbuphine Hcl] Hives   Went into cardiac arrest    Codeine Hives, Nausea Only   Has tolerated Oxycodone   Darvocet [propoxyphene  N-acetaminophen] Nausea Only      Medication List       Accurate as of 07/31/16  2:28 PM. Always use your most recent med list.          alendronate 70 MG/75ML solution Commonly known as:  FOSAMAX Take 70 mg by mouth every Saturday. Take on an empty stomach with a full glass of fluids, remain upright 30 minutes after administration - for bone health. Give 75 mL   amiodarone 100 MG tablet Commonly known as:  PACERONE Take 100 mg by mouth daily.   atorvastatin 10 MG tablet Commonly known as:  LIPITOR Take 10 mg by mouth daily at 6 PM.   Biotin 5000 MCG Caps Take 5,000 mcg by mouth daily at 6 PM.   bisacodyl 10 MG suppository Commonly known as:  DULCOLAX Place 10 mg rectally daily as needed (constipation).   calcitRIOL 0.25 MCG capsule Commonly known as:  ROCALTROL Take 0.25 mcg by mouth daily.   carvedilol 3.125 MG tablet Commonly known as:  COREG Take 1 tablet (3.125 mg total) by mouth 2 (two) times daily with a meal.   cholecalciferol 1000 units tablet Commonly known as:  VITAMIN D Take 2,000 Units by mouth at bedtime.   cycloSPORINE 0.05 % ophthalmic emulsion Commonly known as:  RESTASIS Place 1 drop into both eyes 2 (two) times daily.   donepezil 10 MG tablet Commonly known as:  ARICEPT Take 10 mg by mouth at bedtime.   DULoxetine 60 MG capsule Commonly known as:  CYMBALTA Take 60 mg by mouth daily at 6 PM.   esomeprazole 40 MG capsule Commonly known as:  NEXIUM Take 40 mg by mouth daily.   febuxostat 40 MG tablet Commonly known as:  ULORIC Take 40 mg by mouth daily at 6 PM.   ferrous sulfate 325 (65 FE) MG tablet Take 325 mg by mouth daily.   hydrALAZINE 10 MG tablet Commonly known as:  APRESOLINE Take 1 tablet (10 mg total) by mouth 3 (three) times daily.   ipratropium-albuterol 0.5-2.5 (3) MG/3ML Soln Commonly known as:  DUONEB Take 3 mLs by nebulization every 6 (six) hours as needed (wheezing/shortness of breath).   levothyroxine 112 MCG  tablet Commonly known as:  SYNTHROID, LEVOTHROID Take 112 mcg by mouth daily.   LINZESS 145 MCG Caps capsule Generic drug:  linaclotide Take 145 mcg  by mouth daily.   memantine 5 MG tablet Commonly known as:  NAMENDA Take 5 mg by mouth 2 (two) times daily.   multivitamin with minerals Tabs tablet Take 1 tablet by mouth daily.   nitroGLYCERIN 0.4 MG SL tablet Commonly known as:  NITROSTAT Place 0.4 mg under the tongue every 5 (five) minutes as needed for chest pain (MAX 3 TABLETS).   polyethylene glycol packet Commonly known as:  MIRALAX / GLYCOLAX Take 17 g by mouth 2 (two) times daily.   pramoxine 1 % Lotn Commonly known as:  SARNA SENSITIVE Apply 1 application topically as needed (itching).   pregabalin 50 MG capsule Commonly known as:  LYRICA Take 1 capsule (50 mg total) by mouth 2 (two) times daily as needed (pain).   senna-docusate 8.6-50 MG tablet Commonly known as:  Senokot-S Take 1 tablet by mouth 2 (two) times daily.   simethicone 80 MG chewable tablet Commonly known as:  MYLICON Chew 80 mg by mouth every 12 (twelve) hours as needed for flatulence.   torsemide 20 MG tablet Commonly known as:  DEMADEX Take 1 tablet (20 mg total) by mouth daily.   vitamin B-12 1000 MCG tablet Commonly known as:  CYANOCOBALAMIN Take 1,000 mcg by mouth 2 (two) times a week. Tuesday and Friday   warfarin 2 MG tablet Commonly known as:  COUMADIN Take 1 tablet (2 mg total) by mouth daily at 6 PM.       Immunizations: Immunization History  Administered Date(s) Administered  . PPD Test 06/29/2015     Physical Exam:  Vitals:   07/31/16 1420  BP: (!) 142/66  Pulse: 71  Resp: 18  Temp: 98.2 F (36.8 C)  TempSrc: Oral  SpO2: 93%  Weight: 174 lb 6.4 oz (79.1 kg)  Height: 4\' 10"  (1.473 m)   Body mass index is 36.45 kg/m.  General- elderly female, obese, in no acute distress Head- normocephalic, atraumatic Nose- no maxillary or frontal sinus tenderness, no  nasal discharge Throat- moist mucus membrane, normal oropharynx, has dentures Eyes- PERRLA, EOMI, no pallor, no icterus, no discharge, normal conjunctiva, normal sclera Neck- no cervical lymphadenopathy Cardiovascular- normal s1,s2, no murmur Respiratory- bilateral clear to auscultation, no wheeze, no rhonchi, no crackles, no use of accessory muscles Abdomen- bowel sounds present, soft, non tender, no guarding or rigidity, no CVA tenderness Musculoskeletal- able to move all 4 extremities, on wheelchair,  trace leg edema  Neurological- alert and oriented to person, place and time Skin- warm and dry, easy bruising Psychiatry- normal mood and affect   Labs reviewed: Basic Metabolic Panel:  Recent Labs  16/10/96 0838 07/29/16 0813 07/30/16 0111  NA 138 140 137  K 4.1 4.3 4.3  CL 97* 101 99*  CO2 28 29 29   GLUCOSE 93 96 102*  BUN 41* 40* 46*  CREATININE 2.20* 2.25* 2.46*  CALCIUM 9.1 9.2 8.5*   Liver Function Tests:  Recent Labs  06/13/16 0521 07/14/16 1823 07/24/16 0216  AST 26 25 19   ALT 14 14 14   ALKPHOS 59 63 56  BILITOT 0.8 0.6 0.7  PROT 5.9* 6.1* 5.3*  ALBUMIN 3.1* 3.5 2.9*   No results for input(s): LIPASE, AMYLASE in the last 8760 hours. No results for input(s): AMMONIA in the last 8760 hours. CBC:  Recent Labs  06/13/16 0521  07/14/16 1823  07/21/16 0627 07/22/16 0202 07/24/16 0216 07/25/16 0443  WBC 14.0*  < > 5.3  < > 13.3* 12.2* 7.4 6.5  NEUTROABS 11.6*  --  2.9  --  9.8*  --   --   --   HGB 11.8*  < > 10.8*  < > 12.4 9.4* 9.8* 9.5*  HCT 36.9  < > 32.8*  < > 39.3 28.8* 30.8* 30.3*  MCV 95.1  < > 95.3  < > 95.6 95.0 94.5 95.9  PLT 220  < > 227  < > 305 175 175 191  < > = values in this interval not displayed. Cardiac Enzymes:  Recent Labs  06/22/16 1414 06/22/16 1811 06/23/16 0049  TROPONINI 0.03* 0.03* <0.03   BNP: Invalid input(s): POCBNP CBG:  Recent Labs  06/27/16 1143 07/21/16 0643 07/28/16 1213  GLUCAP 106* 259* 102*     Radiological Exams: Dg Chest 1 View  Result Date: 07/14/2016 CLINICAL DATA:  Status post fall, with right-sided chest pain and right shoulder bruising. Initial encounter. EXAM: CHEST 1 VIEW COMPARISON:  Chest radiograph performed 06/25/2016 FINDINGS: The lungs are mildly hypoexpanded, with minimal left basilar atelectasis. Mild peribronchial thickening is noted. There is no evidence of pleural effusion or pneumothorax. The cardiomediastinal silhouette is mildly enlarged. A pacemaker is noted overlying the right chest wall, with leads ending overlying the right atrium and right ventricle. A valve replacement is noted. No acute osseous abnormalities are seen. IMPRESSION: 1. No displaced rib fracture seen. 2. Lungs mildly hypoexpanded, with minimal left basilar atelectasis. Mild peribronchial thickening seen. 3. Mild cardiomegaly. Electronically Signed   By: Roanna Raider M.D.   On: 07/14/2016 18:27   Dg Chest 2 View  Result Date: 07/24/2016 CLINICAL DATA:  Shortness of Breath EXAM: CHEST  2 VIEW COMPARISON:  July 22, 2016 FINDINGS: There is mild bibasilar atelectasis, slightly greater on the left than on the right. There is no frank edema or consolidation. Heart is mildly enlarged with pulmonary vascularity within normal limits. Patient is status post aortic valve replacement. Pacemaker leads are attached the right atrium and right ventricle. No pneumothorax. There is aortic atherosclerosis. No evident adenopathy. There is degenerative change in each shoulder. IMPRESSION: Bibasilar atelectasis, slightly more on the left than on the right. No frank edema or consolidation. Stable cardiac enlargement. Aortic atherosclerosis. Electronically Signed   By: Bretta Bang III M.D.   On: 07/24/2016 09:51   Dg Shoulder Right  Result Date: 07/14/2016 CLINICAL DATA:  Status post fall from standing position, with right shoulder bruising. Initial encounter. EXAM: RIGHT SHOULDER - 2+ VIEW COMPARISON:  None.  FINDINGS: There is no evidence of fracture or dislocation. The right humeral head is seated within the glenoid fossa. Minimal degenerative change is noted about the glenohumeral joint. Degenerative change is seen at the right acromioclavicular joint. There is calcification of the right rotator cuff. A right-sided pacemaker is noted. The visualized portions of the right lung are grossly clear. IMPRESSION: 1. No evidence of fracture or dislocation. 2. Degenerative change at the right acromioclavicular joint. 3. Calcification at the right rotator cuff. Electronically Signed   By: Roanna Raider M.D.   On: 07/14/2016 18:34   Dg Wrist Complete Left  Result Date: 07/14/2016 CLINICAL DATA:  Had unwitnessed fall today. Not alert enough to localize her pain but specified some anterior wrist pain on left wrist and some lateral wrist pain on right wrist. EXAM: LEFT WRIST - COMPLETE 3+ VIEW COMPARISON:  None. FINDINGS: Generalized osteopenia. No acute fracture or dislocation. Mild radiocarpal osteoarthritis. Chondrocalcinosis of the TFCC as can be seen with CPPD. Mild osteoarthritis of the first [facility] joint and first MCP joint. IMPRESSION:  No acute osseous injury of the left wrist. Electronically Signed   By: Elige Ko   On: 07/14/2016 21:11   Dg Wrist Complete Right  Result Date: 07/14/2016 CLINICAL DATA:  Status post fall, right wrist pain EXAM: RIGHT WRIST - COMPLETE 3+ VIEW COMPARISON:  None. FINDINGS: Generalized osteopenia. No acute fracture or dislocation. Mild osteoarthritis of the first CMC joint. Moderate osteoarthritis of the first MCP joint. Chondrocalcinosis of the right TFCC as can be seen with CPPD. IMPRESSION: No acute osseous injury of the right wrist. Electronically Signed   By: Elige Ko   On: 07/14/2016 21:13   Ct Head Wo Contrast  Result Date: 07/15/2016 CLINICAL DATA:  Unwitnessed fall yesterday.  Headache. EXAM: CT HEAD WITHOUT CONTRAST TECHNIQUE: Contiguous axial images were obtained from  the base of the skull through the vertex without intravenous contrast. COMPARISON:  07/14/2016 FINDINGS: Brain: Old lacunar infarct in the left basal ganglia. Mild generalized atrophy. No acute intracranial abnormality. Specifically, no hemorrhage, hydrocephalus, mass lesion, acute infarction, or significant intracranial injury. Vascular: No hyperdense vessel or unexpected calcification. Skull: No acute calvarial abnormality. Sinuses/Orbits: Visualized paranasal sinuses and mastoids clear. Orbital soft tissues unremarkable. Other: None IMPRESSION: Mild atrophy. Old left lacunar basal ganglia infarct. No acute intracranial abnormality. Electronically Signed   By: Charlett Nose M.D.   On: 07/15/2016 09:01   Ct Head Wo Contrast  Result Date: 07/14/2016 CLINICAL DATA:  Larey Seat at home today, large bump on LEFT side of head, on blood thinners EXAM: CT HEAD WITHOUT CONTRAST CT CERVICAL SPINE WITHOUT CONTRAST TECHNIQUE: Multidetector CT imaging of the head and cervical spine was performed following the standard protocol without intravenous contrast. Multiplanar CT image reconstructions of the cervical spine were also generated. COMPARISON:  CT head 05/24/2013 FINDINGS: CT HEAD FINDINGS Brain: Generalized atrophy. Normal ventricular morphology. No midline shift or mass effect. Old LEFT basal ganglia lacunar infarct. No intracranial hemorrhage, mass lesion or evidence acute infarction. No extra-axial fluid collections. Vascular: Atherosclerotic calcifications at the carotid bifurcations. Skull: Intact.  LEFT frontal scalp hematoma noted Sinuses/Orbits: Clear visualized mastoid air cells and paranasal sinuses Other: N/A CT CERVICAL SPINE FINDINGS Alignment: Minimal retrolisthesis at C3-C4 and C4-C5 likely degenerative. Remaining alignments normal. Skull base and vertebrae: Osseous demineralization. Vertebral body heights maintained. No fracture or bone destruction. Mild scattered facet degenerative changes. Visualized  skullbase intact. Moderate calcified pannus adjacent to the odontoid process. Soft tissues and spinal canal: Prevertebral soft tissues normal thickness. Dense atherosclerotic calcifications at the carotid bifurcations and at the proximal great vessels. Spinal canal grossly patent. Disc levels: Multilevel disc space narrowing and endplate spur formation C3-C4 through C6-C7. No obvious disc herniation. Upper chest: Minimal infiltrate or scarring at RIGHT apex. Other: RIGHT subclavian pacemaker leads noted. IMPRESSION: Generalized atrophy. Old LEFT basal ganglia lacunar infarct. No acute intracranial abnormalities. Degenerative disc and facet disease changes cervical spine. No acute cervical spine abnormalities. Minimal infiltrate versus scarring at RIGHT apex. Atherosclerotic calcifications throughout the carotid systems bilaterally. Electronically Signed   By: Ulyses Southward M.D.   On: 07/14/2016 17:46   Ct Cervical Spine Wo Contrast  Result Date: 07/14/2016 CLINICAL DATA:  Larey Seat at home today, large bump on LEFT side of head, on blood thinners EXAM: CT HEAD WITHOUT CONTRAST CT CERVICAL SPINE WITHOUT CONTRAST TECHNIQUE: Multidetector CT imaging of the head and cervical spine was performed following the standard protocol without intravenous contrast. Multiplanar CT image reconstructions of the cervical spine were also generated. COMPARISON:  CT head 05/24/2013 FINDINGS: CT  HEAD FINDINGS Brain: Generalized atrophy. Normal ventricular morphology. No midline shift or mass effect. Old LEFT basal ganglia lacunar infarct. No intracranial hemorrhage, mass lesion or evidence acute infarction. No extra-axial fluid collections. Vascular: Atherosclerotic calcifications at the carotid bifurcations. Skull: Intact.  LEFT frontal scalp hematoma noted Sinuses/Orbits: Clear visualized mastoid air cells and paranasal sinuses Other: N/A CT CERVICAL SPINE FINDINGS Alignment: Minimal retrolisthesis at C3-C4 and C4-C5 likely degenerative.  Remaining alignments normal. Skull base and vertebrae: Osseous demineralization. Vertebral body heights maintained. No fracture or bone destruction. Mild scattered facet degenerative changes. Visualized skullbase intact. Moderate calcified pannus adjacent to the odontoid process. Soft tissues and spinal canal: Prevertebral soft tissues normal thickness. Dense atherosclerotic calcifications at the carotid bifurcations and at the proximal great vessels. Spinal canal grossly patent. Disc levels: Multilevel disc space narrowing and endplate spur formation C3-C4 through C6-C7. No obvious disc herniation. Upper chest: Minimal infiltrate or scarring at RIGHT apex. Other: RIGHT subclavian pacemaker leads noted. IMPRESSION: Generalized atrophy. Old LEFT basal ganglia lacunar infarct. No acute intracranial abnormalities. Degenerative disc and facet disease changes cervical spine. No acute cervical spine abnormalities. Minimal infiltrate versus scarring at RIGHT apex. Atherosclerotic calcifications throughout the carotid systems bilaterally. Electronically Signed   By: Ulyses Southward M.D.   On: 07/14/2016 17:46   Dg Chest Port 1 View  Result Date: 07/22/2016 CLINICAL DATA:  Respiratory failure, asthma, atrial fibrillation, history of previous aortic valve replacement, chronic renal insufficiency. EXAM: PORTABLE CHEST 1 VIEW COMPARISON:  Portable chest x-ray of July 21, 2016 FINDINGS: The lungs are reasonably well inflated. The interstitium has cleared somewhat. There is persistent left basilar atelectasis or pneumonia with small left pleural effusion. The cardiac silhouette remains enlarged. The pulmonary vascularity is less engorged. The ICD is in stable position. There is calcification in the wall of the thoracic aorta. The prosthetic aortic valve cage is stable in appearance. IMPRESSION: Improved appearance of the pulmonary interstitium consistent with resolving pulmonary edema. Persistent left lower lobe atelectasis or  pneumonia. Thoracic aortic atherosclerosis. Electronically Signed   By: David  [place] M.D.   On: 07/22/2016 07:17   Dg Chest Portable 1 View  Result Date: 07/21/2016 CLINICAL DATA:  Shortness of breath, cardiomyopathy, atrial fibrillation EXAM: PORTABLE CHEST 1 VIEW COMPARISON:  Portable chest x-ray of July 14, 2016 FINDINGS: The lungs are adequately inflated. The interstitial markings are increased and more conspicuous than on the previous study. There is increased density at the left lung base with obscuration of the hemidiaphragm. The cardiac silhouette is mildly enlarged. The pulmonary vascularity is engorged. A prosthetic aortic valve cage remains visible. There is calcification in the wall of the aortic arch. The ICD is in stable position. IMPRESSION: Worsening of the pulmonary interstitium consistent with edema. There is bilateral pulmonary vascular congestion. Persistent left lower lobe atelectasis or pneumonia. Thoracic aortic atherosclerosis. Electronically Signed   By: David  [place] M.D.   On: 07/21/2016 07:05   Dg Abd Portable 1v  Result Date: 07/30/2016 CLINICAL DATA:  Left lower quadrant pain EXAM: PORTABLE ABDOMEN - 1 VIEW COMPARISON:  Abdominal and pelvic CT scan of November 10, 2012 FINDINGS: The colonic stool burden is increased diffusely. There is no small or large bowel obstructive pattern. There is a moderate amount of stool in the rectum. There is severe degenerative disc disease at all lumbar levels as well as in the lower thoracic spine. An inferior vena caval filter is present at the L3-4 level to the right of midline. There is an old fracture  of the very lateral aspect of the right tenth rib. IMPRESSION: Increase colonic stool burden may reflect constipation in the appropriate clinical setting. There is no evidence of ileus or obstruction. Severe degenerative disc disease of the lumbar spine. Electronically Signed   By: David  [place] M.D.   On: 07/30/2016 12:23     Assessment/Plan  Generalized weakness With deconditioning. Will have her work with physical therapy and occupational therapy team to help with gait training and muscle strengthening exercises.fall precautions. Skin care. Encourage to be out of bed.   Acute on chronic respiratory failure Breathing stable on room air. Recent pneumonia and chf exacerbation. Continue prn duoneb  Aspiration pneumonia Completed course of antibiotic. Monitor clinically. Aspiration precautions  chronic CHF Stable. Monitor weight 3 days a week x 1 month. s/p diuresis in hospital. Continue torsemide 20 mg daily, hydralazine 10 mg 3 times a day, carvedilol 3.125 mg twice a day. Make cardiology f/u for 4/17.   afib Rate controlled. continue carvedilol twice a day and amiodarone 100 mg daily. Currently on coumadin for stroke prophylaxis.  reviewed benefits versus risk of continuing Coumadin given her fall risk. Patient understands the risk associated with being on Coumadin but wants to continue with that given her history of atrial fibrillation and history of aortic valve replacement. Will have cardiology evaluate and discuss further regarding continuation of anticoagulation.   s/p TAVR Cardiology f/u   GERD Has ongoing symptoms. Change her Nexium to 40 mg twice a day and monitor.  Dementia without behavioral disturbance Patient is alert and oriented 3 this visit. Continue Namenda 5 mg twice a day with donepezil 10 mg daily and monitor. Provide supportive care.  ckd stage 4 Monitor BMP   Goals of carlong-term-care   Labs/tests ordered: CBC, BMP  Family/ staff Communication: reviewed care plan with patient and nursing supervisor  I have spent greater than 40 minutes for this encounter which includes reviewing hospital records, addressing above mentioned concerns, reviewing care plan with patient, answering patient's concerns and counseling her.     Oneal Grout, MD Internal Medicine [facility] [address] [place], [place] [address] Cell Phone (Monday-Friday 8 am - 5 pm): [phone] On Call: [phone] and follow prompts after 5 pm and on weekends Office Phone: [phone] Office Fax: [phone]

## 2016-07-31 NOTE — Telephone Encounter (Signed)
This is a patient of [facility], who was admitted to [facility] after hospitalization. [facility] F/U is needed. Hospital discharge from [facility] on 07/30/2016

## 2016-07-31 NOTE — Telephone Encounter (Signed)
Will review - she has an appt next week with Vin Bhagat, PA-C.

## 2016-08-01 DIAGNOSIS — M25551 Pain in right hip: Secondary | ICD-10-CM | POA: Diagnosis not present

## 2016-08-01 DIAGNOSIS — M25532 Pain in left wrist: Secondary | ICD-10-CM | POA: Diagnosis not present

## 2016-08-01 DIAGNOSIS — Z9181 History of falling: Secondary | ICD-10-CM | POA: Diagnosis not present

## 2016-08-01 DIAGNOSIS — M5441 Lumbago with sciatica, right side: Secondary | ICD-10-CM | POA: Diagnosis not present

## 2016-08-01 DIAGNOSIS — M545 Low back pain: Secondary | ICD-10-CM | POA: Diagnosis not present

## 2016-08-04 LAB — BASIC METABOLIC PANEL
BUN: 42 mg/dL — AB (ref 4–21)
BUN: 42 mg/dL — AB (ref 4–21)
CREATININE: 1.8 mg/dL — AB (ref 0.5–1.1)
Creatinine: 1.8 mg/dL — AB (ref 0.5–1.1)
GLUCOSE: 100 mg/dL
Glucose: 100 mg/dL
POTASSIUM: 3.7 mmol/L (ref 3.4–5.3)
Potassium: 3.7 mmol/L (ref 3.4–5.3)
SODIUM: 144 mmol/L (ref 137–147)
Sodium: 144 mmol/L (ref 137–147)

## 2016-08-04 LAB — CBC AND DIFFERENTIAL
HEMATOCRIT: 34 % — AB (ref 36–46)
HEMOGLOBIN: 10.6 g/dL — AB (ref 12.0–16.0)
NEUTROS ABS: 5 /uL
Platelets: 261 10*3/uL (ref 150–399)
WBC: 7 10*3/mL

## 2016-08-05 ENCOUNTER — Ambulatory Visit (INDEPENDENT_AMBULATORY_CARE_PROVIDER_SITE_OTHER): Payer: Medicare Other | Admitting: *Deleted

## 2016-08-05 ENCOUNTER — Encounter: Payer: Self-pay | Admitting: Physician Assistant

## 2016-08-05 ENCOUNTER — Encounter (INDEPENDENT_AMBULATORY_CARE_PROVIDER_SITE_OTHER): Payer: Self-pay

## 2016-08-05 ENCOUNTER — Ambulatory Visit (INDEPENDENT_AMBULATORY_CARE_PROVIDER_SITE_OTHER): Payer: Medicare Other | Admitting: Physician Assistant

## 2016-08-05 VITALS — BP 150/90 | HR 83 | Wt 172.1 lb

## 2016-08-05 DIAGNOSIS — I10 Essential (primary) hypertension: Secondary | ICD-10-CM

## 2016-08-05 DIAGNOSIS — N184 Chronic kidney disease, stage 4 (severe): Secondary | ICD-10-CM | POA: Diagnosis not present

## 2016-08-05 DIAGNOSIS — I428 Other cardiomyopathies: Secondary | ICD-10-CM | POA: Diagnosis not present

## 2016-08-05 DIAGNOSIS — I480 Paroxysmal atrial fibrillation: Secondary | ICD-10-CM

## 2016-08-05 DIAGNOSIS — I5043 Acute on chronic combined systolic (congestive) and diastolic (congestive) heart failure: Secondary | ICD-10-CM

## 2016-08-05 DIAGNOSIS — Z950 Presence of cardiac pacemaker: Secondary | ICD-10-CM

## 2016-08-05 DIAGNOSIS — I359 Nonrheumatic aortic valve disorder, unspecified: Secondary | ICD-10-CM

## 2016-08-05 LAB — CUP PACEART INCLINIC DEVICE CHECK
Battery Impedance: 651 Ohm
Battery Voltage: 2.77 V
Brady Statistic AP VS Percent: 75 %
Brady Statistic AS VP Percent: 0 %
Implantable Lead Location: 753859
Implantable Lead Location: 753860
Implantable Lead Model: 5076
Implantable Pulse Generator Implant Date: 20120307
Lead Channel Pacing Threshold Amplitude: 0.5 V
Lead Channel Pacing Threshold Amplitude: 0.5 V
Lead Channel Pacing Threshold Amplitude: 0.5 V
Lead Channel Pacing Threshold Pulse Width: 0.4 ms
Lead Channel Pacing Threshold Pulse Width: 0.4 ms
Lead Channel Sensing Intrinsic Amplitude: 2 mV
Lead Channel Setting Pacing Pulse Width: 0.4 ms
MDC IDC LEAD IMPLANT DT: 20120307
MDC IDC LEAD IMPLANT DT: 20120307
MDC IDC MSMT BATTERY REMAINING LONGEVITY: 71 mo
MDC IDC MSMT LEADCHNL RA IMPEDANCE VALUE: 527 Ohm
MDC IDC MSMT LEADCHNL RA PACING THRESHOLD AMPLITUDE: 0.5 V
MDC IDC MSMT LEADCHNL RV IMPEDANCE VALUE: 564 Ohm
MDC IDC MSMT LEADCHNL RV PACING THRESHOLD PULSEWIDTH: 0.4 ms
MDC IDC MSMT LEADCHNL RV PACING THRESHOLD PULSEWIDTH: 0.4 ms
MDC IDC MSMT LEADCHNL RV SENSING INTR AMPL: 2.8 mV
MDC IDC SESS DTM: 20180417123526
MDC IDC SET LEADCHNL RA PACING AMPLITUDE: 2 V
MDC IDC SET LEADCHNL RV PACING AMPLITUDE: 2.5 V
MDC IDC SET LEADCHNL RV SENSING SENSITIVITY: 1.4 mV
MDC IDC STAT BRADY AP VP PERCENT: 10 %
MDC IDC STAT BRADY AS VS PERCENT: 15 %

## 2016-08-05 LAB — CBC AND DIFFERENTIAL
HEMATOCRIT: 32 % — AB (ref 36–46)
Hemoglobin: 10.5 g/dL — AB (ref 12.0–16.0)
PLATELETS: 282 10*3/uL (ref 150–399)
WBC: 7.9 10^3/mL

## 2016-08-05 MED ORDER — ISOSORBIDE MONONITRATE ER 30 MG PO TB24: 30.0000 mg | ORAL_TABLET | Freq: Every day | ORAL | 3 refills | Status: DC

## 2016-08-05 NOTE — Patient Instructions (Addendum)
Medication Instructions:  Your physician has recommended you make the following change in your medication:  1.  START Imdur 30 mg taking 1 daily  Labwork: None ordered  Testing/Procedures: None ordered  Follow-Up: Your physician recommends that you schedule a follow-up appointment in: 1ST AVAILABLE WITH DR. Ladona Ridgel FOR PACER CHECK  Your physician recommends that you schedule a follow-up appointment in: 3 MONTHS WITH DR. Excell Seltzer   Any Other Special Instructions Will Be Listed Below (If Applicable).     If you need a refill on your cardiac medications before your next appointment, please call your pharmacy.

## 2016-08-05 NOTE — Progress Notes (Signed)
Pacemaker check in clinic. Normal device function. Thresholds, sensing, impedances consistent with previous measurements. Device programmed to maximize longevity. 1 AHR episode- 31 seconds of AT/PACs. No high ventricular rates noted. Device programmed at appropriate safety margins. Histogram distribution appropriate for patient activity level. Device programmed to optimize intrinsic conduction. Estimated longevity 4.5-7 years. Carelink 11/04/16 (new monitor ordered), ROV with GT next available.

## 2016-08-07 DIAGNOSIS — M545 Low back pain: Secondary | ICD-10-CM | POA: Diagnosis not present

## 2016-08-07 DIAGNOSIS — M5441 Lumbago with sciatica, right side: Secondary | ICD-10-CM | POA: Diagnosis not present

## 2016-08-07 DIAGNOSIS — Z9181 History of falling: Secondary | ICD-10-CM | POA: Diagnosis not present

## 2016-08-07 DIAGNOSIS — M25551 Pain in right hip: Secondary | ICD-10-CM | POA: Diagnosis not present

## 2016-08-07 DIAGNOSIS — M25532 Pain in left wrist: Secondary | ICD-10-CM | POA: Diagnosis not present

## 2016-08-12 LAB — BASIC METABOLIC PANEL WITH GFR
BUN: 29 mg/dL — AB (ref 4–21)
Creatinine: 1.6 mg/dL — AB (ref 0.5–1.1)
Glucose: 95 mg/dL
Potassium: 3.1 mmol/L — AB (ref 3.4–5.3)
Sodium: 146 mmol/L (ref 137–147)

## 2016-08-19 DIAGNOSIS — N2581 Secondary hyperparathyroidism of renal origin: Secondary | ICD-10-CM | POA: Diagnosis not present

## 2016-08-19 DIAGNOSIS — I129 Hypertensive chronic kidney disease with stage 1 through stage 4 chronic kidney disease, or unspecified chronic kidney disease: Secondary | ICD-10-CM | POA: Diagnosis not present

## 2016-08-19 DIAGNOSIS — D631 Anemia in chronic kidney disease: Secondary | ICD-10-CM | POA: Diagnosis not present

## 2016-08-19 DIAGNOSIS — N184 Chronic kidney disease, stage 4 (severe): Secondary | ICD-10-CM | POA: Diagnosis not present

## 2016-08-22 DIAGNOSIS — R1319 Other dysphagia: Secondary | ICD-10-CM | POA: Diagnosis not present

## 2016-08-25 ENCOUNTER — Encounter: Payer: Self-pay | Admitting: Adult Health

## 2016-08-25 ENCOUNTER — Non-Acute Institutional Stay (SKILLED_NURSING_FACILITY): Payer: Medicare Other | Admitting: Adult Health

## 2016-08-25 DIAGNOSIS — E876 Hypokalemia: Secondary | ICD-10-CM | POA: Diagnosis not present

## 2016-08-25 DIAGNOSIS — K219 Gastro-esophageal reflux disease without esophagitis: Secondary | ICD-10-CM

## 2016-08-25 DIAGNOSIS — N184 Chronic kidney disease, stage 4 (severe): Secondary | ICD-10-CM

## 2016-08-25 DIAGNOSIS — R1310 Dysphagia, unspecified: Secondary | ICD-10-CM

## 2016-08-25 DIAGNOSIS — R1319 Other dysphagia: Secondary | ICD-10-CM | POA: Diagnosis not present

## 2016-08-25 DIAGNOSIS — R531 Weakness: Secondary | ICD-10-CM

## 2016-08-25 DIAGNOSIS — K5909 Other constipation: Secondary | ICD-10-CM

## 2016-08-25 DIAGNOSIS — I25119 Atherosclerotic heart disease of native coronary artery with unspecified angina pectoris: Secondary | ICD-10-CM

## 2016-08-25 DIAGNOSIS — E785 Hyperlipidemia, unspecified: Secondary | ICD-10-CM | POA: Diagnosis not present

## 2016-08-25 DIAGNOSIS — M810 Age-related osteoporosis without current pathological fracture: Secondary | ICD-10-CM

## 2016-08-25 DIAGNOSIS — I5042 Chronic combined systolic (congestive) and diastolic (congestive) heart failure: Secondary | ICD-10-CM | POA: Diagnosis not present

## 2016-08-25 DIAGNOSIS — E038 Other specified hypothyroidism: Secondary | ICD-10-CM

## 2016-08-25 DIAGNOSIS — F0390 Unspecified dementia without behavioral disturbance: Secondary | ICD-10-CM

## 2016-08-25 DIAGNOSIS — M1A9XX0 Chronic gout, unspecified, without tophus (tophi): Secondary | ICD-10-CM

## 2016-08-25 DIAGNOSIS — M792 Neuralgia and neuritis, unspecified: Secondary | ICD-10-CM

## 2016-08-25 DIAGNOSIS — D638 Anemia in other chronic diseases classified elsewhere: Secondary | ICD-10-CM

## 2016-08-25 DIAGNOSIS — I10 Essential (primary) hypertension: Secondary | ICD-10-CM | POA: Diagnosis not present

## 2016-08-25 DIAGNOSIS — I480 Paroxysmal atrial fibrillation: Secondary | ICD-10-CM

## 2016-08-25 NOTE — Progress Notes (Signed)
Patient ID: [name], female   DOB: September 28, 1932, 83 y.o.   MRN: [id number]    DATE:  08/25/2016  MRN:  [id number]  BIRTHDAY: July 03, 1932  Facility:  Nursing Home Location:  [facility]  Nursing Home Room Number: 905-B  LEVEL OF CARE:  SNF [phone]  Contact Information    Name Relation Home Work [place] Son [phone]  [phone]   Gray,Sharon Daughter [phone]  [phone]   [facility] Daughter [phone]         Code Status History    Date Active Date Inactive Code Status Order ID Comments User Context   06/13/2016 10:17 AM 06/27/2016  6:11 PM Full Code [id number]  Levie Heritage, DO ED   03/08/2015  8:25 PM 03/12/2015  5:09 PM Full Code [id number]  Lanney Gins, PA-C Inpatient   12/15/2014 12:39 AM 12/15/2014  6:36 PM Full Code [id number]  Yevonne Pax, MD Inpatient   12/04/2014  4:00 PM 12/07/2014  5:13 PM Full Code [id number]  Lanney Gins, PA-C Inpatient   12/21/2012  3:56 PM 12/28/2012  7:34 PM DNR [id number]  Tonny Bollman, MD Inpatient       Chief Complaint  Patient presents with  . Medical Management of Chronic Issues    HISTORY OF PRESENT ILLNESS:  This is an 83-YO female seen for a routine visit. She is a long-term resident of [address] [facility]. She was recently supplemented with K due to hypokalemia. Latest K 3.7, wnl. Torsemide was recently increased to 40 mg daily per recent consultation with nephrology. She is being followed-up by physiatry and had her Lyrica decreased from 50 mg to 25 mg BID     PAST MEDICAL HISTORY:  Past Medical History:  Diagnosis Date  . Acute on chronic renal failure [facility])    sees Dr Allena Katz   . Anemia    Acute blood loss  . Aortic regurgitation   . Aortic stenosis 10/13/2012   Low EF, low gradient with severe aortic stenosis confirmed by dobutamine stress echocardiogram s/p TAVR 12/2012  . Asthma   . Atrial fibrillation (HCC)    tachy-brady syndrome with <1% recurrent PAF  since pacemaker placement  . Cataracts, bilateral   . Chronic combined systolic and diastolic CHF (congestive heart failure) (HCC)   . Chronic lower back pain   . CKD (chronic kidney disease)   . Coronary artery disease involving native coronary artery of native heart   . Dementia    Without behavioral disturbance  . Dysrhythmia   . Fibromyalgia   . Gastroesophageal reflux disease   . H/O dizziness   . H/O urinary frequency   . H/O: stroke   . Hard of hearing   . Headache    hx of migraines   . Heart murmur   . History of blood transfusion   . History of bronchitis   . History of kidney stones   . History of urinary tract infection   . HLD (hyperlipidemia)   . HTN (hypertension)   . Hypothyroidism   . Neuropathy   . Nonischemic cardiomyopathy (HCC)   . On home oxygen therapy    patient uses at nite- 2L- has not used in > 6 months per patient  . Osteoarthritis   . Osteoporosis   . Pneumonia    hx of x 3   . PONV (postoperative nausea and vomiting)   . Presence of permanent cardiac pacemaker   . Pulmonary embolism (HCC)  HISTORY OF, the pt. had a recurrent bilateral pulmonary emboli in 2005, on warfarin therapy and at which time she under went implantation of IVC filter  . Repeated falls   . Rupture of right patellar tendon   . Sciatica   . Shortness of breath dyspnea    with exertion   . Sleep apnea    uses oxygen at night and PRN- not used since > 6 months / DOES NOT USE  C-PAP  . Spinal stenosis   . Stroke (HCC)   . Symptomatic bradycardia   . Symptomatic bradycardia 2012   s/p Medtronic PPM  . Syncope   . Urinary incontinence   . Urinary urgency      CURRENT MEDICATIONS: Reviewed  Patient's Medications  New Prescriptions   No medications on file  Previous Medications   ACETAMINOPHEN (TYLENOL) 325 MG TABLET    Take 650 mg by mouth every 4 (four) hours as needed for mild pain.   ALENDRONATE (FOSAMAX) 70 MG/75ML SOLUTION    Take 70 mg by mouth every  Saturday. Take on an empty stomach with a full glass of fluids, remain upright 30 minutes after administration - for bone health. Give 75 mL   AMIODARONE (PACERONE) 100 MG TABLET    Take 100 mg by mouth daily.   ATORVASTATIN (LIPITOR) 10 MG TABLET    Take 10 mg by mouth daily at 6 PM.    BIOTIN 5000 MCG CAPS    Take 5,000 mcg by mouth daily at 6 PM.   BISACODYL (DULCOLAX) 10 MG SUPPOSITORY    Place 10 mg rectally daily as needed (constipation).    CALCITRIOL (ROCALTROL) 0.25 MCG CAPSULE    Take 0.25 mcg by mouth daily.    CARVEDILOL (COREG) 3.125 MG TABLET    Take 1 tablet (3.125 mg total) by mouth 2 (two) times daily with a meal.   CHOLECALCIFEROL (VITAMIN D) 1000 UNITS TABLET    Take 2,000 Units by mouth at bedtime.   CYCLOSPORINE (RESTASIS) 0.05 % OPHTHALMIC EMULSION    Place 1 drop into both eyes 2 (two) times daily.    DONEPEZIL (ARICEPT) 10 MG TABLET    Take 10 mg by mouth at bedtime.    DULOXETINE (CYMBALTA) 60 MG CAPSULE    Take 60 mg by mouth daily at 6 PM.    ESOMEPRAZOLE (NEXIUM) 40 MG CAPSULE    Take 40 mg by mouth 2 (two) times daily.    FEBUXOSTAT (ULORIC) 40 MG TABLET    Take 40 mg by mouth daily at 6 PM.   FERROUS SULFATE 325 (65 FE) MG TABLET    Take 325 mg by mouth daily.    HYDRALAZINE (APRESOLINE) 10 MG TABLET    Take 1 tablet (10 mg total) by mouth 3 (three) times daily.   IPRATROPIUM-ALBUTEROL (DUONEB) 0.5-2.5 (3) MG/3ML SOLN    Take 3 mLs by nebulization every 6 (six) hours as needed (wheezing/shortness of breath).   ISOSORBIDE MONONITRATE (IMDUR) 30 MG 24 HR TABLET    Take 1 tablet (30 mg total) by mouth daily.   LEVOTHYROXINE (SYNTHROID, LEVOTHROID) 112 MCG TABLET    Take 112 mcg by mouth daily.    LINACLOTIDE (LINZESS) 145 MCG CAPS CAPSULE    Take 145 mcg by mouth daily.    MEMANTINE (NAMENDA) 5 MG TABLET    Take 5 mg by mouth 2 (two) times daily.    MENTHOL (ICY HOT) 5 % PTCH    Apply 1 application topically every evening.  Apply patch to left shoulder and lower back  QPM, remove QAM   MULTIPLE VITAMIN (MULTIVITAMIN WITH MINERALS) TABS TABLET    Take 1 tablet by mouth daily.   NITROGLYCERIN (NITROSTAT) 0.4 MG SL TABLET    Place 0.4 mg under the tongue every 5 (five) minutes as needed for chest pain (MAX 3 TABLETS).    POLYETHYLENE GLYCOL (MIRALAX / GLYCOLAX) PACKET    Take 17 g by mouth 2 (two) times daily.   POTASSIUM CHLORIDE (KLOR-CON) 20 MEQ PACKET    Take 20 mEq by mouth 2 (two) times daily.   PRAMOXINE (SARNA SENSITIVE) 1 % LOTN    Apply 1 application topically as needed (itching).    PREGABALIN (LYRICA) 25 MG CAPSULE    Take 25 mg by mouth 2 (two) times daily. 6AM and 6PM   SENNA-DOCUSATE (SENOKOT-S) 8.6-50 MG TABLET    Take 1 tablet by mouth 2 (two) times daily.   SIMETHICONE (MYLICON) 80 MG CHEWABLE TABLET    Chew 80 mg by mouth every 12 (twelve) hours as needed for flatulence.    TORSEMIDE (DEMADEX) 20 MG TABLET    Take 40 mg by mouth daily. Take 2 tablets to = 40 mg   VITAMIN B-12 (CYANOCOBALAMIN) 1000 MCG TABLET    Take 1,000 mcg by mouth 2 (two) times a week. Tuesday and Friday  Modified Medications   No medications on file  Discontinued Medications   PREGABALIN (LYRICA) 50 MG CAPSULE    Take 1 capsule (50 mg total) by mouth 2 (two) times daily as needed (pain).   TORSEMIDE (DEMADEX) 20 MG TABLET    Take 1 tablet (20 mg total) by mouth daily.   WARFARIN (COUMADIN) 2 MG TABLET    Take 1 tablet (2 mg total) by mouth daily at 6 PM.     Allergies  Allergen Reactions  . Nubain [Nalbuphine Hcl] Hives    Went into cardiac arrest   . Codeine Hives and Nausea Only    Has tolerated Oxycodone  . Darvocet [Propoxyphene N-Acetaminophen] Nausea Only     REVIEW OF SYSTEMS:  GENERAL: no change in appetite, no fatigue, no weight changes, no fever, chills or weakness EYES: Denies change in vision, dry eyes, eye pain, itching or discharge EARS: Denies change in hearing, ringing in ears, or earache NOSE: Denies nasal congestion or epistaxis MOUTH and  THROAT: Denies oral discomfort, gingival pain or bleeding, pain from teeth or hoarseness   RESPIRATORY: no cough, SOB, DOE, wheezing, hemoptysis CARDIAC: no chest pain, edema or palpitations GI: no abdominal pain, diarrhea, constipation, heart burn, nausea or vomiting GU: Denies dysuria, frequency, hematuria, incontinence, or discharge PSYCHIATRIC: Denies feeling of depression or anxiety. No report of hallucinations, insomnia, paranoia, or agitation     PHYSICAL EXAMINATION  GENERAL APPEARANCE: Well nourished. In no acute distress. Obese SKIN:  Skin is warm and dry.  HEAD: Normal in size and contour. No evidence of trauma EYES: Lids open and close normally. No blepharitis, entropion or ectropion. PERRL. Conjunctivae are clear and sclerae are white. Lenses are without opacity EARS: Pinnae are normal. Patient hears normal voice tunes of the examiner MOUTH and THROAT: Lips are without lesions. Oral mucosa is moist and without lesions. Tongue is normal in shape, size, and color and without lesions NECK: supple, trachea midline, no neck masses, no thyroid tenderness, no thyromegaly LYMPHATICS: no LAN in the neck, no supraclavicular LAN RESPIRATORY: breathing is even & unlabored, BS CTAB CARDIAC: RRR, no murmur,no extra heart sounds, no  edema, + right chest pacemaker GI: abdomen soft, normal BS, no masses, no tenderness, no hepatomegaly, no splenomegaly EXTREMITIES:  Able to move X 4 extremities PSYCHIATRIC: Alert and oriented X 3. Affect and behavior are appropriate    LABS/RADIOLOGY: Labs reviewed: Basic Metabolic Panel:  Recent Labs  19/14/78 0838 07/29/16 0813 07/30/16 0111 08/04/16 08/12/16  NA 138 140 137 144  144 146  K 4.1 4.3 4.3 3.7  3.7 3.1*  CL 97* 101 99*  --   --   CO2 28 29 29   --   --   GLUCOSE 93 96 102*  --   --   BUN 41* 40* 46* 42*  42* 29*  CREATININE 2.20* 2.25* 2.46* 1.8*  1.8* 1.6*  CALCIUM 9.1 9.2 8.5*  --   --    Liver Function Tests:  Recent  Labs  06/13/16 0521 07/14/16 1823 07/24/16 0216  AST 26 25 19   ALT 14 14 14   ALKPHOS 59 63 56  BILITOT 0.8 0.6 0.7  PROT 5.9* 6.1* 5.3*  ALBUMIN 3.1* 3.5 2.9*   CBC:  Recent Labs  07/14/16 1823  07/21/16 0627 07/22/16 0202 07/24/16 0216 07/25/16 0443 08/04/16  WBC 5.3  < > 13.3* 12.2* 7.4 6.5 7.0  NEUTROABS 2.9  --  9.8*  --   --   --  5  HGB 10.8*  < > 12.4 9.4* 9.8* 9.5* 10.6*  HCT 32.8*  < > 39.3 28.8* 30.8* 30.3* 34*  MCV 95.3  < > 95.6 95.0 94.5 95.9  --   PLT 227  < > 305 175 175 191 261  < > = values in this interval not displayed. Lipid Panel:  Recent Labs  10/01/15  HDL 36   Cardiac Enzymes:  Recent Labs  06/22/16 1414 06/22/16 1811 06/23/16 0049  TROPONINI 0.03* 0.03* <0.03   CBG:  Recent Labs  06/27/16 1143 07/21/16 0643 07/28/16 1213  GLUCAP 106* 259* 102*       ASSESSMENT/PLAN:  Generalized weakness - continue  rehabilitation with PT and OT for therapeutic strengthening exercises; fall precautions  Chronic combined systolic and diastolic CHF - no SOB, continue Torsemide 20 mg give 2 tabs = 40 mg Q D, Coreg 3.125 mg 1 tab by mouth twice a day, isosorbide mononitrate ER 30 mg 1 tab by mouth daily And hydralazine 25 mg 1 tab by mouth 3 times a day  Hypokalemia  - continue KCL ER 20 meq 1 tab PO BID; BMP in 1 week Lab Results  Component Value Date   K 3.1 (A) 08/12/2016   Dysphagia - currently having ST treatments for safe swallowing and appropriate food consistency; aspiration precautions  Dementia without behavioral disturbance - continue Namenda 5 mg 1 tab by mouth twice a day and Aricept 10 mg 1 tab by mouth daily at bedtime; continue supportive care; fall precautions  Osteoporosis - continue Fosamax 70 mg by mouth every Saturdays  GERD - continue Nexium 40 mg 1 capsule by mouth BID  Hypothyroidism - continue levothyroxine 112 g 1 tab by mouth daily Lab Results  Component Value Date   TSH 2.673 06/15/2016   Chronic  constipation - continue MiraLAX 17 g by mouth twice a day, Senokot 8.6 mg 2 tabs by mouth twice a day, Linzess 145 g 1 capsule by mouth daily, Bisacodyl 10 mg suppository rectally daily PRN  PAF - rate controlled; continue Pacerone 100 mg 1 tab by mouth daily, Coreg 12.5 mg 1 tab by mouth twice a  day and Coumadin  CKD, stage 4 - continue Calcitrol 0.25 mg Q D; will monitor Lab Results  Component Value Date   CREATININE 1.6 (A) 08/12/2016   Anemia of chronic disease - continue ferrous sulfate 325 mg 1 tab by mouth daily Lab Results  Component Value Date   HGB 10.6 (A) 08/04/2016   Hyperlipidemia - continue Lipitor 10 mg 1 tab by mouth every 6 p.m.  Gout - continue Uloric 40 mg 1 tab by mouth daily  Hypertension - well-controlled; continue hydralazine 25 mg 1 tab by mouth 3 times a day, Coreg 12.5 m on a known g 1 tab by mouth twice a day  Neuropathic pain - recently decreased Lyrica from 50 mg to 25 mg BID and Cymbalta 60 mg 1 capsule by mouth every evening, followed-up by physiatry   CAD - no chest pains; continue and NTG when necessary and isosorbide MN ER 30 mg 1 tab by mouth daily     Goals of care:  Long-term care     [name] - NP    [employer] [phone]

## 2016-08-26 DIAGNOSIS — M25532 Pain in left wrist: Secondary | ICD-10-CM | POA: Diagnosis not present

## 2016-08-26 DIAGNOSIS — M25551 Pain in right hip: Secondary | ICD-10-CM | POA: Diagnosis not present

## 2016-08-26 DIAGNOSIS — R1319 Other dysphagia: Secondary | ICD-10-CM | POA: Diagnosis not present

## 2016-08-26 DIAGNOSIS — Z9181 History of falling: Secondary | ICD-10-CM | POA: Diagnosis not present

## 2016-08-26 DIAGNOSIS — M545 Low back pain: Secondary | ICD-10-CM | POA: Diagnosis not present

## 2016-08-26 DIAGNOSIS — M5441 Lumbago with sciatica, right side: Secondary | ICD-10-CM | POA: Diagnosis not present

## 2016-08-26 DIAGNOSIS — D649 Anemia, unspecified: Secondary | ICD-10-CM | POA: Diagnosis not present

## 2016-08-26 LAB — BASIC METABOLIC PANEL
BUN: 49 mg/dL — AB (ref 4–21)
Creatinine: 1.9 mg/dL — AB (ref 0.5–1.1)
GLUCOSE: 92 mg/dL
Potassium: 4.7 mmol/L (ref 3.4–5.3)
Sodium: 143 mmol/L (ref 137–147)

## 2016-08-27 DIAGNOSIS — R1319 Other dysphagia: Secondary | ICD-10-CM | POA: Diagnosis not present

## 2016-08-28 DIAGNOSIS — R1319 Other dysphagia: Secondary | ICD-10-CM | POA: Diagnosis not present

## 2016-08-29 DIAGNOSIS — R1319 Other dysphagia: Secondary | ICD-10-CM | POA: Diagnosis not present

## 2016-09-01 DIAGNOSIS — R1319 Other dysphagia: Secondary | ICD-10-CM | POA: Diagnosis not present

## 2016-09-02 DIAGNOSIS — R1319 Other dysphagia: Secondary | ICD-10-CM | POA: Diagnosis not present

## 2016-09-03 ENCOUNTER — Encounter: Payer: Self-pay | Admitting: Internal Medicine

## 2016-09-03 DIAGNOSIS — R1319 Other dysphagia: Secondary | ICD-10-CM | POA: Diagnosis not present

## 2016-09-03 DIAGNOSIS — D649 Anemia, unspecified: Secondary | ICD-10-CM | POA: Diagnosis not present

## 2016-09-04 DIAGNOSIS — R1319 Other dysphagia: Secondary | ICD-10-CM | POA: Diagnosis not present

## 2016-09-06 DIAGNOSIS — R1319 Other dysphagia: Secondary | ICD-10-CM | POA: Diagnosis not present

## 2016-09-09 DIAGNOSIS — R1319 Other dysphagia: Secondary | ICD-10-CM | POA: Diagnosis not present

## 2016-09-16 ENCOUNTER — Non-Acute Institutional Stay (SKILLED_NURSING_FACILITY): Payer: Medicare Other | Admitting: Adult Health

## 2016-09-16 ENCOUNTER — Encounter: Payer: Self-pay | Admitting: Adult Health

## 2016-09-16 DIAGNOSIS — Z7901 Long term (current) use of anticoagulants: Secondary | ICD-10-CM

## 2016-09-16 DIAGNOSIS — Z5181 Encounter for therapeutic drug level monitoring: Secondary | ICD-10-CM | POA: Diagnosis not present

## 2016-09-16 DIAGNOSIS — I480 Paroxysmal atrial fibrillation: Secondary | ICD-10-CM

## 2016-09-16 NOTE — Progress Notes (Signed)
Location:   [facility] Room Number: 905-B Place of Service:  SNF (31)   CODE STATUS: full code   Allergies  Allergen Reactions  . Nubain [Nalbuphine Hcl] Hives    Went into cardiac arrest   . Codeine Hives and Nausea Only    Has tolerated Oxycodone  . Darvocet [Propoxyphene N-Acetaminophen] Nausea Only    Chief Complaint  Patient presents with  . Acute Visit    INR    HPI:  She is on chronic coumadin therapy for her afib. There are no reports of missed doses. She is not voicing any complaints at this time. Her coumadin had been on hold since 09-12-16 for an INR of 6.8. Today her INR is 1.9   Past Medical History:  Diagnosis Date  . Acute on chronic renal failure [facility])    sees Dr Allena Katz   . Anemia    Acute blood loss  . Aortic regurgitation   . Aortic stenosis 10/13/2012   Low EF, low gradient with severe aortic stenosis confirmed by dobutamine stress echocardiogram s/p TAVR 12/2012  . Asthma   . Atrial fibrillation (HCC)    tachy-brady syndrome with <1% recurrent PAF since pacemaker placement  . Cataracts, bilateral   . Chronic combined systolic and diastolic CHF (congestive heart failure) (HCC)   . Chronic lower back pain   . CKD (chronic kidney disease)   . Coronary artery disease involving native coronary artery of native heart   . Dementia    Without behavioral disturbance  . Dysrhythmia   . Fibromyalgia   . Gastroesophageal reflux disease   . H/O dizziness   . H/O urinary frequency   . H/O: stroke   . Hard of hearing   . Headache    hx of migraines   . Heart murmur   . History of blood transfusion   . History of bronchitis   . History of kidney stones   . History of urinary tract infection   . HLD (hyperlipidemia)   . HTN (hypertension)   . Hypothyroidism   . Neuropathy   . Nonischemic cardiomyopathy (HCC)   . On home oxygen therapy    patient uses at nite- 2L- has not used in > 6 months per patient  . Osteoarthritis   .  Osteoporosis   . Pneumonia    hx of x 3   . PONV (postoperative nausea and vomiting)   . Presence of permanent cardiac pacemaker   . Pulmonary embolism (HCC)    HISTORY OF, the pt. had a recurrent bilateral pulmonary emboli in 2005, on warfarin therapy and at which time she under went implantation of IVC filter  . Repeated falls   . Rupture of right patellar tendon   . Sciatica   . Shortness of breath dyspnea    with exertion   . Sleep apnea    uses oxygen at night and PRN- not used since > 6 months / DOES NOT USE  C-PAP  . Spinal stenosis   . Stroke (HCC)   . Symptomatic bradycardia   . Symptomatic bradycardia 2012   s/p Medtronic PPM  . Syncope   . Urinary incontinence   . Urinary urgency     Past Surgical History:  Procedure Laterality Date  . ABDOMINAL HYSTERECTOMY    . APPENDECTOMY    . BACK SURGERY    . BUNIONECTOMY    . CARDIAC CATHETERIZATION    . CATARACT EXTRACTION    . CENTRAL  VENOUS CATHETER INSERTION Left 12/21/2012   Procedure: INSERTION CENTRAL LINE ADULT;  Surgeon: Tonny Bollman, MD;  Location: [facility] OR;  Service: Open Heart Surgery;  Laterality: Left;  . ELBOW SURGERY     bilat   . INTRAOPERATIVE TRANSESOPHAGEAL ECHOCARDIOGRAM N/A 12/21/2012   Procedure: INTRAOPERATIVE TRANSESOPHAGEAL ECHOCARDIOGRAM;  Surgeon: Tonny Bollman, MD;  Location: [facility] OR;  Service: Open Heart Surgery;  Laterality: N/A;  . KNEE SURGERY Left   . LEFT AND RIGHT HEART CATHETERIZATION WITH CORONARY ANGIOGRAM N/A 10/18/2012   Procedure: LEFT AND RIGHT HEART CATHETERIZATION WITH CORONARY ANGIOGRAM;  Surgeon: Tonny Bollman, MD;  Location: [facility] CATH LAB;  Service: Cardiovascular;  Laterality: N/A;  . NASAL SEPTUM SURGERY    . NOSE SURGERY     X 2  . ORIF PATELLA Right 03/08/2015   Procedure:  OPEN REDUCTION INTERNAL FIXATION RIGHT  PATELLA TENDON AVULSION;  Surgeon: Durene Romans, MD;  Location: [facility] ORS;  Service: Orthopedics;  Laterality: Right;  . PACEMAKER INSERTION     Medtronic  . PATELLAR  TENDON REPAIR Right 06/25/2015   Procedure: RIGHT PATELLA TENDON REVISION/REPAIR;  Surgeon: Durene Romans, MD;  Location: [facility] ORS;  Service: Orthopedics;  Laterality: Right;  . SHOULDER SURGERY     bilat   . THYROIDECTOMY, PARTIAL    . TONSILLECTOMY    . TOTAL KNEE ARTHROPLASTY Right 12/04/2014   Procedure: RIGHT TOTAL  KNEE ARTHROPLASTY;  Surgeon: Durene Romans, MD;  Location: [facility] ORS;  Service: Orthopedics;  Laterality: Right;  . TRANSCATHETER AORTIC VALVE REPLACEMENT, TRANSFEMORAL  12/21/2012   a. 67mm Edwards Sapien XT transcatheter heart valve placed via open left transfemoral approach b. Intra-op TEE: well-seated bioprosthetic aortic valve with mean gradient 2 mmHg, trivial AI, mild MR, EF 30-35%  . TRANSCATHETER AORTIC VALVE REPLACEMENT, TRANSFEMORAL N/A 12/21/2012   Procedure: TRANSCATHETER AORTIC VALVE REPLACEMENT, TRANSFEMORAL;  Surgeon: Tonny Bollman, MD;  Location: [facility] OR;  Service: Open Heart Surgery;  Laterality: N/A;    Social History   Social History  . Marital status: Divorced    Spouse name: N/A  . Number of children: N/A  . Years of education: N/A   Occupational History  . Not on file.   Social History Main Topics  . Smoking status: Never Smoker  . Smokeless tobacco: Never Used  . Alcohol use No  . Drug use: No  . Sexual activity: Not Currently   Other Topics Concern  . Not on file   Social History Narrative  . No narrative on file   Family History  Problem Relation Age of Onset  . Ovarian cancer Mother        Deceased  . Epilepsy Father        Deceased      VITAL SIGNS BP 140/80   Pulse 65   Temp 98.1 F (36.7 C) (Oral)   Resp 18   Ht 4\' 10"  (1.473 m)   Wt 170 lb 4.8 oz (77.2 kg)   SpO2 98%   BMI 35.59 kg/m   Patient's Medications  New Prescriptions   No medications on file  Previous Medications   ACETAMINOPHEN (TYLENOL) 325 MG TABLET    Take 650 mg by mouth every 4 (four) hours as needed for mild pain.   ALENDRONATE (FOSAMAX) 70 MG/75ML  SOLUTION    Take 70 mg by mouth every Saturday. Take on an empty stomach with a full glass of fluids, remain upright 30 minutes after administration - for bone health. Give 75 mL   AMIODARONE (PACERONE) 100  MG TABLET    Take 100 mg by mouth daily.   ATORVASTATIN (LIPITOR) 10 MG TABLET    Take 10 mg by mouth daily at 6 PM.    BIOTIN 5000 MCG CAPS    Take 5,000 mcg by mouth daily at 6 PM.   BISACODYL (DULCOLAX) 10 MG SUPPOSITORY    Place 10 mg rectally daily as needed (constipation).    CALCITRIOL (ROCALTROL) 0.25 MCG CAPSULE    Take 0.25 mcg by mouth daily.    CARVEDILOL (COREG) 3.125 MG TABLET    Take 1 tablet (3.125 mg total) by mouth 2 (two) times daily with a meal.   CHOLECALCIFEROL (VITAMIN D) 1000 UNITS TABLET    Take 2,000 Units by mouth at bedtime.   CYCLOSPORINE (RESTASIS) 0.05 % OPHTHALMIC EMULSION    Place 1 drop into both eyes 2 (two) times daily.    DONEPEZIL (ARICEPT) 10 MG TABLET    Take 10 mg by mouth at bedtime.    DULOXETINE (CYMBALTA) 60 MG CAPSULE    Take 60 mg by mouth daily at 6 PM.    ESOMEPRAZOLE (NEXIUM) 40 MG CAPSULE    Take 40 mg by mouth 2 (two) times daily.    FEBUXOSTAT (ULORIC) 40 MG TABLET    Take 40 mg by mouth daily at 6 PM.   FERROUS SULFATE 325 (65 FE) MG TABLET    Take 325 mg by mouth daily.    HYDRALAZINE (APRESOLINE) 10 MG TABLET    Take 1 tablet (10 mg total) by mouth 3 (three) times daily.   IPRATROPIUM-ALBUTEROL (DUONEB) 0.5-2.5 (3) MG/3ML SOLN    Take 3 mLs by nebulization every 6 (six) hours as needed (wheezing/shortness of breath).   ISOSORBIDE MONONITRATE (IMDUR) 30 MG 24 HR TABLET    Take 1 tablet (30 mg total) by mouth daily.   LEVOTHYROXINE (SYNTHROID, LEVOTHROID) 112 MCG TABLET    Take 112 mcg by mouth daily.    LINACLOTIDE (LINZESS) 145 MCG CAPS CAPSULE    Take 145 mcg by mouth daily.    MEMANTINE (NAMENDA) 5 MG TABLET    Take 5 mg by mouth 2 (two) times daily.    MULTIPLE VITAMIN (MULTIVITAMIN WITH MINERALS) TABS TABLET    Take 1 tablet by mouth  daily.   NITROGLYCERIN (NITROSTAT) 0.4 MG SL TABLET    Place 0.4 mg under the tongue every 5 (five) minutes as needed for chest pain (MAX 3 TABLETS).    POLYETHYLENE GLYCOL (MIRALAX / GLYCOLAX) PACKET    Take 17 g by mouth 2 (two) times daily.   POTASSIUM CHLORIDE (KLOR-CON) 20 MEQ PACKET    Take 20 mEq by mouth daily.    PRAMOXINE (SARNA SENSITIVE) 1 % LOTN    Apply 1 application topically as needed (itching).    PREGABALIN (LYRICA) 25 MG CAPSULE    Take 25 mg by mouth 2 (two) times daily. 6AM and 6PM   SENNA-DOCUSATE (SENOKOT-S) 8.6-50 MG TABLET    Take 1 tablet by mouth 2 (two) times daily.   SIMETHICONE (MYLICON) 80 MG CHEWABLE TABLET    Chew 80 mg by mouth every 12 (twelve) hours as needed for flatulence.    TORSEMIDE (DEMADEX) 20 MG TABLET    Take 20 mg by mouth daily. Take 2 tablets to = 40 mg    VITAMIN B-12 (CYANOCOBALAMIN) 1000 MCG TABLET    Take 1,000 mcg by mouth 2 (two) times a week. Tuesday and Friday  Modified Medications   No medications on file  Discontinued Medications   MENTHOL (ICY HOT) 5 % PTCH    Apply 1 application topically every evening. Apply patch to left shoulder and lower back QPM, remove QAM     SIGNIFICANT DIAGNOSTIC EXAMS  LABS REVIEWED:   09-12-16: INR 6.8 09-16-16: INR 1.9    Review of Systems  Constitutional: Negative for malaise/fatigue.  Respiratory: Negative for cough and shortness of breath.   Cardiovascular: Negative for chest pain, palpitations and leg swelling.  Gastrointestinal: Negative for abdominal pain, constipation and heartburn.  Musculoskeletal: Negative for back pain, joint pain and myalgias.  Skin: Negative.   Neurological: Negative for dizziness.  Psychiatric/Behavioral: The patient is not nervous/anxious.     Physical Exam  Constitutional: No distress.  Eyes: Conjunctivae are normal.  Neck: Neck supple. No JVD present. No thyromegaly present.  Cardiovascular: Normal rate, regular rhythm and intact distal pulses.     Respiratory: Effort normal and breath sounds normal. No respiratory distress. She has no wheezes.  GI: Soft. Bowel sounds are normal. She exhibits no distension. There is no tenderness.  Musculoskeletal: She exhibits no edema.  Able to move all extremities   Lymphadenopathy:    She has no cervical adenopathy.  Neurological: She is alert.  Skin: Skin is warm and dry. She is not diaphoretic.  Psychiatric: She has a normal mood and affect.     ASSESSMENT/ PLAN:  1. Afib 2. Anticoagulation management  For INR 1.9 will restart coumadin 1.5 mg nightly and will check INR on Friday   MD is aware of resident's narcotic use and is in agreement with current plan of care. We will attempt to wean resident as apropriate   Synthia Innocent NP [facility]  Contact [phone] Monday through Friday 8am- 5pm  After hours call [phone]

## 2016-09-17 ENCOUNTER — Encounter: Payer: Self-pay | Admitting: Cardiovascular Disease

## 2016-09-18 DIAGNOSIS — Z96651 Presence of right artificial knee joint: Secondary | ICD-10-CM | POA: Diagnosis not present

## 2016-09-18 DIAGNOSIS — S86811D Strain of other muscle(s) and tendon(s) at lower leg level, right leg, subsequent encounter: Secondary | ICD-10-CM | POA: Diagnosis not present

## 2016-09-18 DIAGNOSIS — M1712 Unilateral primary osteoarthritis, left knee: Secondary | ICD-10-CM | POA: Diagnosis not present

## 2016-09-18 DIAGNOSIS — Z471 Aftercare following joint replacement surgery: Secondary | ICD-10-CM | POA: Diagnosis not present

## 2016-09-19 ENCOUNTER — Encounter: Payer: Self-pay | Admitting: Adult Health

## 2016-09-19 ENCOUNTER — Non-Acute Institutional Stay (SKILLED_NURSING_FACILITY): Payer: Medicare Other | Admitting: Adult Health

## 2016-09-19 DIAGNOSIS — Z7901 Long term (current) use of anticoagulants: Secondary | ICD-10-CM

## 2016-09-19 DIAGNOSIS — I480 Paroxysmal atrial fibrillation: Secondary | ICD-10-CM | POA: Diagnosis not present

## 2016-09-19 NOTE — Progress Notes (Signed)
Subjective:     Indication: atrial fibrillation Bleeding signs/symptoms: None Thromboembolic signs/symptoms: None  Missed Coumadin doses: None Medication changes: no Dietary changes: no Bacterial/viral infection: no Other concerns: no  The following portions of the patient's history were reviewed and updated as appropriate: allergies, current medications, past family history, past medical history, past social history, past surgical history and problem list.  Review of Systems A comprehensive review of systems was negative.   Objective:    INR Today: 1.6 Current dose: Coumadin 1.5 mg Q D    Assessment:    Subtherapeutic INR for goal of 2-3   Plan:    1. New dose:  Increase Coumadin to 2 mg PO Q D   2. Next INR: 09/22/16

## 2016-09-23 DIAGNOSIS — R2681 Unsteadiness on feet: Secondary | ICD-10-CM | POA: Diagnosis not present

## 2016-09-23 DIAGNOSIS — M6281 Muscle weakness (generalized): Secondary | ICD-10-CM | POA: Diagnosis not present

## 2016-09-29 DIAGNOSIS — Z5181 Encounter for therapeutic drug level monitoring: Secondary | ICD-10-CM | POA: Insufficient documentation

## 2016-09-29 DIAGNOSIS — Z7901 Long term (current) use of anticoagulants: Secondary | ICD-10-CM | POA: Insufficient documentation

## 2016-10-01 DIAGNOSIS — M5136 Other intervertebral disc degeneration, lumbar region: Secondary | ICD-10-CM | POA: Diagnosis not present

## 2016-10-01 DIAGNOSIS — M542 Cervicalgia: Secondary | ICD-10-CM | POA: Diagnosis not present

## 2016-10-01 DIAGNOSIS — G8929 Other chronic pain: Secondary | ICD-10-CM | POA: Diagnosis not present

## 2016-10-01 DIAGNOSIS — M5441 Lumbago with sciatica, right side: Secondary | ICD-10-CM | POA: Diagnosis not present

## 2016-10-08 ENCOUNTER — Encounter: Payer: Self-pay | Admitting: Cardiovascular Disease

## 2016-10-08 ENCOUNTER — Ambulatory Visit (INDEPENDENT_AMBULATORY_CARE_PROVIDER_SITE_OTHER): Payer: Medicare Other | Admitting: Cardiovascular Disease

## 2016-10-08 VITALS — BP 130/60 | HR 68 | Ht <= 58 in | Wt 173.0 lb

## 2016-10-08 DIAGNOSIS — I480 Paroxysmal atrial fibrillation: Secondary | ICD-10-CM

## 2016-10-08 DIAGNOSIS — I359 Nonrheumatic aortic valve disorder, unspecified: Secondary | ICD-10-CM

## 2016-10-08 DIAGNOSIS — R2681 Unsteadiness on feet: Secondary | ICD-10-CM | POA: Diagnosis not present

## 2016-10-08 DIAGNOSIS — I5023 Acute on chronic systolic (congestive) heart failure: Secondary | ICD-10-CM | POA: Diagnosis not present

## 2016-10-08 DIAGNOSIS — M6281 Muscle weakness (generalized): Secondary | ICD-10-CM | POA: Diagnosis not present

## 2016-10-08 NOTE — Patient Instructions (Signed)
Medication Instructions:  Please weigh patient on Monday and Thursday every week.  If the patient's weight is 5 pounds above her dry weight of 168 pounds please given Metolazone 2.5mg  30 minutes prior to her morning dosage of Torsemide. Do not give Metolazone more than twice per week.   Labwork: No new orders.  Testing/Procedures: No new orders.   Follow-Up: Your physician recommends that you schedule a follow-up appointment in: 3 MONTHS with Vin Bhagat PA-C   Any Other Special Instructions Will Be Listed Below (If Applicable).     If you need a refill on your cardiac medications before your next appointment, please call your pharmacy.

## 2016-10-08 NOTE — Progress Notes (Signed)
Cardiology Office Note Date:  10/12/2016   ID:  [name], DOB [birth year], MRN [id number]  PCP:  Oneal Grout, MD  Cardiologist:  Tonny Bollman, MD    Chief Complaint  Patient presents with  . Follow-up    3 month     History of Present Illness: [name] is a 83 y.o. female who presents for FU of heart failure and aortic valve disease.   The patient has longstanding systolic heart failure, CKD Stage 4, and aortic valve disease. She underwent TAVR in 2014 with a 29 mm Sapien XT valve. She's chronically anticoagulated with warfarin for atrial fibrillation.  The patient was hospitalized in February, March, and again in April of this year. 2 of those admissions were for CHF. She continues to have shortness of breath with activity as well as orthopnea. No chest pain. She lost weight with IV diuresis during her most recent hospitalization but is gaining weight again. Most recent creatinine is 1.85 mg/dL. She is here with her son today.   Past Medical History:  Diagnosis Date  . Acute on chronic renal failure [facility])    sees Dr Allena Katz   . Anemia    Acute blood loss  . Aortic regurgitation   . Aortic stenosis 10/13/2012   Low EF, low gradient with severe aortic stenosis confirmed by dobutamine stress echocardiogram s/p TAVR 12/2012  . Asthma   . Atrial fibrillation (HCC)    tachy-brady syndrome with <1% recurrent PAF since pacemaker placement  . Cataracts, bilateral   . Chronic combined systolic and diastolic CHF (congestive heart failure) (HCC)   . Chronic lower back pain   . CKD (chronic kidney disease)   . Coronary artery disease involving native coronary artery of native heart   . Dementia    Without behavioral disturbance  . Dysrhythmia   . Fibromyalgia   . Gastroesophageal reflux disease   . H/O dizziness   . H/O urinary frequency   . H/O: stroke   . Hard of hearing   . Headache    hx of migraines   . Heart murmur   . History of blood transfusion   .  History of bronchitis   . History of kidney stones   . History of urinary tract infection   . HLD (hyperlipidemia)   . HTN (hypertension)   . Hypothyroidism   . Neuropathy   . Nonischemic cardiomyopathy (HCC)   . On home oxygen therapy    patient uses at nite- 2L- has not used in > 6 months per patient  . Osteoarthritis   . Osteoporosis   . Pneumonia    hx of x 3   . PONV (postoperative nausea and vomiting)   . Presence of permanent cardiac pacemaker   . Pulmonary embolism (HCC)    HISTORY OF, the pt. had a recurrent bilateral pulmonary emboli in 2005, on warfarin therapy and at which time she under went implantation of IVC filter  . Repeated falls   . Rupture of right patellar tendon   . Sciatica   . Shortness of breath dyspnea    with exertion   . Sleep apnea    uses oxygen at night and PRN- not used since > 6 months / DOES NOT USE  C-PAP  . Spinal stenosis   . Stroke (HCC)   . Symptomatic bradycardia   . Symptomatic bradycardia 2012   s/p Medtronic PPM  . Syncope   . Urinary incontinence   . Urinary urgency  Past Surgical History:  Procedure Laterality Date  . ABDOMINAL HYSTERECTOMY    . APPENDECTOMY    . BACK SURGERY    . BUNIONECTOMY    . CARDIAC CATHETERIZATION    . CATARACT EXTRACTION    . CENTRAL VENOUS CATHETER INSERTION Left 12/21/2012   Procedure: INSERTION CENTRAL LINE ADULT;  Surgeon: Tonny Bollman, MD;  Location: [facility] OR;  Service: Open Heart Surgery;  Laterality: Left;  . ELBOW SURGERY     bilat   . INTRAOPERATIVE TRANSESOPHAGEAL ECHOCARDIOGRAM N/A 12/21/2012   Procedure: INTRAOPERATIVE TRANSESOPHAGEAL ECHOCARDIOGRAM;  Surgeon: Tonny Bollman, MD;  Location: [facility] OR;  Service: Open Heart Surgery;  Laterality: N/A;  . KNEE SURGERY Left   . LEFT AND RIGHT HEART CATHETERIZATION WITH CORONARY ANGIOGRAM N/A 10/18/2012   Procedure: LEFT AND RIGHT HEART CATHETERIZATION WITH CORONARY ANGIOGRAM;  Surgeon: Tonny Bollman, MD;  Location: [facility] CATH LAB;  Service:  Cardiovascular;  Laterality: N/A;  . NASAL SEPTUM SURGERY    . NOSE SURGERY     X 2  . ORIF PATELLA Right 03/08/2015   Procedure:  OPEN REDUCTION INTERNAL FIXATION RIGHT  PATELLA TENDON AVULSION;  Surgeon: Durene Romans, MD;  Location: [facility] ORS;  Service: Orthopedics;  Laterality: Right;  . PACEMAKER INSERTION     Medtronic  . PATELLAR TENDON REPAIR Right 06/25/2015   Procedure: RIGHT PATELLA TENDON REVISION/REPAIR;  Surgeon: Durene Romans, MD;  Location: [facility] ORS;  Service: Orthopedics;  Laterality: Right;  . SHOULDER SURGERY     bilat   . THYROIDECTOMY, PARTIAL    . TONSILLECTOMY    . TOTAL KNEE ARTHROPLASTY Right 12/04/2014   Procedure: RIGHT TOTAL  KNEE ARTHROPLASTY;  Surgeon: Durene Romans, MD;  Location: [facility] ORS;  Service: Orthopedics;  Laterality: Right;  . TRANSCATHETER AORTIC VALVE REPLACEMENT, TRANSFEMORAL  12/21/2012   a. 59mm Edwards Sapien XT transcatheter heart valve placed via open left transfemoral approach b. Intra-op TEE: well-seated bioprosthetic aortic valve with mean gradient 2 mmHg, trivial AI, mild MR, EF 30-35%  . TRANSCATHETER AORTIC VALVE REPLACEMENT, TRANSFEMORAL N/A 12/21/2012   Procedure: TRANSCATHETER AORTIC VALVE REPLACEMENT, TRANSFEMORAL;  Surgeon: Tonny Bollman, MD;  Location: [facility] OR;  Service: Open Heart Surgery;  Laterality: N/A;    Current Outpatient Prescriptions  Medication Sig Dispense Refill  . acetaminophen (TYLENOL) 325 MG tablet Take 650 mg by mouth every 4 (four) hours as needed for mild pain.    Marland Kitchen alendronate (FOSAMAX) 70 MG/75ML solution Take 70 mg by mouth every Saturday. Take on an empty stomach with a full glass of fluids, remain upright 30 minutes after administration - for bone health. Give 75 mL    . amiodarone (PACERONE) 100 MG tablet Take 100 mg by mouth daily.    Marland Kitchen atorvastatin (LIPITOR) 10 MG tablet Take 10 mg by mouth daily at 6 PM.     . Biotin 5000 MCG CAPS Take 5,000 mcg by mouth daily at 6 PM.    . bisacodyl (DULCOLAX) 10 MG suppository Place  10 mg rectally daily as needed (constipation).     . calcitRIOL (ROCALTROL) 0.25 MCG capsule Take 0.25 mcg by mouth daily.     . carvedilol (COREG) 3.125 MG tablet Take 1 tablet (3.125 mg total) by mouth 2 (two) times daily with a meal.    . cholecalciferol (VITAMIN D) 1000 units tablet Take 2,000 Units by mouth at bedtime.    . cycloSPORINE (RESTASIS) 0.05 % ophthalmic emulsion Place 1 drop into both eyes 2 (two) times daily.     Marland Kitchen  donepezil (ARICEPT) 10 MG tablet Take 10 mg by mouth at bedtime.     . DULoxetine (CYMBALTA) 60 MG capsule Take 60 mg by mouth daily at 6 PM.     . esomeprazole (NEXIUM) 40 MG capsule Take 40 mg by mouth 2 (two) times daily.     . febuxostat (ULORIC) 40 MG tablet Take 20 mg by mouth daily at 6 PM.     . ferrous sulfate 325 (65 FE) MG tablet Take 325 mg by mouth daily.     . hydrALAZINE (APRESOLINE) 10 MG tablet Take 1 tablet (10 mg total) by mouth 3 (three) times daily.    Marland Kitchen ipratropium-albuterol (DUONEB) 0.5-2.5 (3) MG/3ML SOLN Take 3 mLs by nebulization every 6 (six) hours as needed (wheezing/shortness of breath).    . isosorbide mononitrate (IMDUR) 30 MG 24 hr tablet Take 1 tablet (30 mg total) by mouth daily. 90 tablet 3  . levothyroxine (SYNTHROID, LEVOTHROID) 112 MCG tablet Take 112 mcg by mouth daily.     . Linaclotide (LINZESS) 145 MCG CAPS capsule Take 145 mcg by mouth daily.     . memantine (NAMENDA) 5 MG tablet Take 5 mg by mouth 2 (two) times daily.     . Multiple Vitamin (MULTIVITAMIN WITH MINERALS) TABS tablet Take 1 tablet by mouth daily.    . nitroGLYCERIN (NITROSTAT) 0.4 MG SL tablet Place 0.4 mg under the tongue every 5 (five) minutes as needed for chest pain (MAX 3 TABLETS).     . polyethylene glycol (MIRALAX / GLYCOLAX) packet Take 17 g by mouth 2 (two) times daily. 14 each 0  . potassium chloride (KLOR-CON) 20 MEQ packet Take 20 mEq by mouth daily.     . pramoxine (SARNA SENSITIVE) 1 % LOTN Apply 1 application topically as needed (itching).     .  pregabalin (LYRICA) 25 MG capsule Take 25 mg by mouth 2 (two) times daily. 6AM and 6PM    . senna-docusate (SENOKOT-S) 8.6-50 MG tablet Take 1 tablet by mouth 2 (two) times daily. 30 tablet 0  . simethicone (MYLICON) 80 MG chewable tablet Chew 80 mg by mouth every 12 (twelve) hours as needed for flatulence.     . torsemide (DEMADEX) 20 MG tablet Take 40 mg by mouth daily.     . vitamin B-12 (CYANOCOBALAMIN) 1000 MCG tablet Take 1,000 mcg by mouth 2 (two) times a week. Tuesday and Friday    . warfarin (COUMADIN) 2 MG tablet Take 2 mg by mouth daily.    . metolazone (ZAROXOLYN) 2.5 MG tablet Take one tablet by mouth 30 minutes prior to morning dosage of Torsemide if dry weight is 5 lb greater than 168 lbs.  No more than twice per week. 90 tablet 3   No current facility-administered medications for this visit.    Facility-Administered Medications Ordered in Other Visits  Medication Dose Route Frequency Provider Last Rate Last Dose  . sodium bicarbonate first hour bolus via infusion   Intravenous Once Pearson Grippe, MD        Allergies:   Nubain [nalbuphine hcl]; Codeine; and Darvocet [propoxyphene n-acetaminophen]   Social History:  The patient  reports that she has never smoked. She has never used smokeless tobacco. She reports that she does not drink alcohol or use drugs.   Family History:  The patient's  family history includes Epilepsy in her father; Ovarian cancer in her mother.    ROS:  Please see the history of present illness.  Otherwise, review of systems  is positive for fatigue, weakness.  All other systems are reviewed and negative.    PHYSICAL EXAM: VS:  BP 130/60   Pulse 68   Ht 4\' 10"  (1.473 m)   Wt 173 lb (78.5 kg)   BMI 36.16 kg/m  , BMI Body mass index is 36.16 kg/m. GEN: Pleasant elderly woman, in a wheelchair , in no acute distress  HEENT: normal  Neck: no JVD, no masses.  Cardiac: RRR without murmur or gallop                Respiratory:  clear to auscultation  bilaterally, normal work of breathing GI: soft, nontender, nondistended, + BS MS: no deformity or atrophy  Ext: 1+ pretibial edema Skin: warm and dry, no rash Neuro:  Strength and sensation are intact Psych: euthymic mood, full affect  EKG:  EKG is not ordered today.  Recent Labs: 06/15/2016: TSH 2.673 07/21/2016: B Natriuretic Peptide 1,636.9 07/24/2016: ALT 14 08/05/2016: Hemoglobin 10.5; Platelets 282 08/26/2016: BUN 49; Creatinine 1.9; Potassium 4.7; Sodium 143   Lipid Panel     Component Value Date/Time   CHOL 153 10/01/2015   TRIG 247 (A) 10/01/2015   HDL 36 10/01/2015   CHOLHDL 1.8 06/16/2010 0415   VLDL 13 06/16/2010 0415   LDLCALC 67 10/01/2015      Wt Readings from Last 3 Encounters:  10/08/16 173 lb (78.5 kg)  09/19/16 170 lb 3.1 oz (77.2 kg)  09/16/16 170 lb 4.8 oz (77.2 kg)     Cardiac Studies Reviewed: 2D Echo 06/13/2016: Study Conclusions  - Left ventricle: LVEF is approximately 35 to 40% with hypokinesis   of inferior, inferoseptal and anterior walls This is new compared   to echo of 2016. The cavity size was moderately dilated. Wall   thickness was normal. Features are consistent with a pseudonormal   left ventricular filling pattern, with concomitant abnormal   relaxation and increased filling pressure (grade 2 diastolic   dysfunction). Doppler parameters are consistent with high   ventricular filling pressure. - Aortic valve: AV prosthesis is difficult to see Peak and mean   gradients through the valve are 12 and 7 mm Hg respectively.   There is trace valvular AI Valve area (VTI): 1.82 cm^2. Valve   area (Vmax): 1.7 cm^2. Valve area (Vmean): 1.89 cm^2. - Mitral valve: There was mild to moderate regurgitation. - Left atrium: The atrium was mildly dilated. - Pulmonary arteries: PA peak pressure: 39 mm Hg (S).  ASSESSMENT AND PLAN: 1.  Aortic valve disease: s/p TAVR, normal function of her aortic bioprosthesis by recent echo  2. Acute on chronic  combined systolic and diastolic heart failure, NYHA III: reviewed meds carefully and also reviewed trend of her weights. Dry weight appears to be 168# on the scale at the [facility]. Recommended metolazone 2.5 mg when weight is 5# above dry weight, to be administered no more than twice weekly. Advised check weight twice weekly.  She will remain on torsemide at current dose.   3. Paroxysmal atrial fibrillation: continues on warfarin  4. CKD 4: continue to follow renal function closely with serial lab work.   Current medicines are reviewed with the patient today.  The patient does not have concerns regarding medicines.  Labs/ tests ordered today include:  No orders of the defined types were placed in this encounter.   Disposition:   FU Chelsea Aus, PA-C in 3 months  Signed, Tonny Bollman, MD  10/12/2016 9:53 PM    [facility] [place], [place], [place]  [address] Phone: [phone]; Fax: [phone]

## 2016-10-21 DIAGNOSIS — N189 Chronic kidney disease, unspecified: Secondary | ICD-10-CM | POA: Diagnosis not present

## 2016-10-21 DIAGNOSIS — N2581 Secondary hyperparathyroidism of renal origin: Secondary | ICD-10-CM | POA: Diagnosis not present

## 2016-10-21 DIAGNOSIS — N184 Chronic kidney disease, stage 4 (severe): Secondary | ICD-10-CM | POA: Diagnosis not present

## 2016-10-27 DIAGNOSIS — D631 Anemia in chronic kidney disease: Secondary | ICD-10-CM | POA: Diagnosis not present

## 2016-10-27 DIAGNOSIS — I129 Hypertensive chronic kidney disease with stage 1 through stage 4 chronic kidney disease, or unspecified chronic kidney disease: Secondary | ICD-10-CM | POA: Diagnosis not present

## 2016-10-27 DIAGNOSIS — N2581 Secondary hyperparathyroidism of renal origin: Secondary | ICD-10-CM | POA: Diagnosis not present

## 2016-10-27 DIAGNOSIS — N184 Chronic kidney disease, stage 4 (severe): Secondary | ICD-10-CM | POA: Diagnosis not present

## 2016-10-30 DIAGNOSIS — Z952 Presence of prosthetic heart valve: Secondary | ICD-10-CM | POA: Diagnosis not present

## 2016-10-30 DIAGNOSIS — I509 Heart failure, unspecified: Secondary | ICD-10-CM | POA: Diagnosis not present

## 2016-10-30 DIAGNOSIS — D649 Anemia, unspecified: Secondary | ICD-10-CM | POA: Diagnosis not present

## 2016-10-30 DIAGNOSIS — R791 Abnormal coagulation profile: Secondary | ICD-10-CM | POA: Diagnosis not present

## 2016-11-01 DIAGNOSIS — M5136 Other intervertebral disc degeneration, lumbar region: Secondary | ICD-10-CM | POA: Diagnosis not present

## 2016-11-01 DIAGNOSIS — M542 Cervicalgia: Secondary | ICD-10-CM | POA: Diagnosis not present

## 2016-11-03 ENCOUNTER — Encounter: Payer: Self-pay | Admitting: Internal Medicine

## 2016-11-03 ENCOUNTER — Ambulatory Visit (INDEPENDENT_AMBULATORY_CARE_PROVIDER_SITE_OTHER): Payer: Medicare Other | Admitting: Internal Medicine

## 2016-11-03 VITALS — BP 142/78 | HR 84 | Ht <= 58 in | Wt 166.0 lb

## 2016-11-03 DIAGNOSIS — I480 Paroxysmal atrial fibrillation: Secondary | ICD-10-CM

## 2016-11-03 DIAGNOSIS — Z950 Presence of cardiac pacemaker: Secondary | ICD-10-CM | POA: Diagnosis not present

## 2016-11-03 DIAGNOSIS — I5032 Chronic diastolic (congestive) heart failure: Secondary | ICD-10-CM | POA: Diagnosis not present

## 2016-11-03 LAB — CUP PACEART INCLINIC DEVICE CHECK
Battery Remaining Longevity: 65 mo
Brady Statistic AP VS Percent: 69 %
Brady Statistic AS VP Percent: 0 %
Brady Statistic AS VS Percent: 30 %
Implantable Lead Implant Date: 20120307
Implantable Lead Location: 753860
Implantable Lead Model: 5076
Lead Channel Impedance Value: 483 Ohm
Lead Channel Pacing Threshold Amplitude: 0.5 V
Lead Channel Pacing Threshold Amplitude: 0.5 V
Lead Channel Pacing Threshold Amplitude: 0.625 V
Lead Channel Pacing Threshold Pulse Width: 0.4 ms
Lead Channel Pacing Threshold Pulse Width: 0.4 ms
Lead Channel Sensing Intrinsic Amplitude: 2.8 mV
Lead Channel Setting Pacing Amplitude: 2 V
Lead Channel Setting Pacing Amplitude: 2.5 V
MDC IDC LEAD IMPLANT DT: 20120307
MDC IDC LEAD LOCATION: 753859
MDC IDC MSMT BATTERY IMPEDANCE: 780 Ohm
MDC IDC MSMT BATTERY VOLTAGE: 2.77 V
MDC IDC MSMT LEADCHNL RA PACING THRESHOLD AMPLITUDE: 0.5 V
MDC IDC MSMT LEADCHNL RA PACING THRESHOLD PULSEWIDTH: 0.4 ms
MDC IDC MSMT LEADCHNL RA SENSING INTR AMPL: 2 mV
MDC IDC MSMT LEADCHNL RV IMPEDANCE VALUE: 588 Ohm
MDC IDC MSMT LEADCHNL RV PACING THRESHOLD PULSEWIDTH: 0.4 ms
MDC IDC PG IMPLANT DT: 20120307
MDC IDC SESS DTM: 20180716154421
MDC IDC SET LEADCHNL RV PACING PULSEWIDTH: 0.4 ms
MDC IDC SET LEADCHNL RV SENSING SENSITIVITY: 1.4 mV
MDC IDC STAT BRADY AP VP PERCENT: 1 %

## 2016-11-03 NOTE — Progress Notes (Signed)
HPI [name] returns today for followup after a long absence from our arrhythmia clinic. She is a very pleasant 83 year old woman, with symptomatic bradycardia, s/p PPM insertion. She denied anginal symptoms. She has only undergone percutaneous aortic valve replacement. In the interim, she has been in the hospital with pulmonary edema and treated with diuresis. She has had some additional problems with pre-renal azotemia as well as volume overload. No other complaints today.  Allergies  Allergen Reactions  . Nubain [Nalbuphine Hcl] Hives    Went into cardiac arrest   . Codeine Hives and Nausea Only    Has tolerated Oxycodone  . Darvocet [Propoxyphene N-Acetaminophen] Nausea Only     Current Outpatient Prescriptions  Medication Sig Dispense Refill  . acetaminophen (TYLENOL) 325 MG tablet Take 650 mg by mouth every 4 (four) hours as needed for mild pain.    Marland Kitchen alendronate (FOSAMAX) 70 MG/75ML solution Take 70 mg by mouth every Saturday. Take on an empty stomach with a full glass of fluids, remain upright 30 minutes after administration - for bone health. Give 75 mL    . amiodarone (PACERONE) 100 MG tablet Take 100 mg by mouth daily.    Marland Kitchen atorvastatin (LIPITOR) 10 MG tablet Take 10 mg by mouth daily at 6 PM.     . Biotin 5000 MCG CAPS Take 5,000 mcg by mouth daily at 6 PM.    . bisacodyl (DULCOLAX) 10 MG suppository Place 10 mg rectally daily as needed (constipation).     . calcitRIOL (ROCALTROL) 0.25 MCG capsule Take 0.25 mcg by mouth daily.     . carvedilol (COREG) 3.125 MG tablet Take 1 tablet (3.125 mg total) by mouth 2 (two) times daily with a meal.    . cholecalciferol (VITAMIN D) 1000 units tablet Take 2,000 Units by mouth at bedtime.    . cycloSPORINE (RESTASIS) 0.05 % ophthalmic emulsion Place 1 drop into both eyes 2 (two) times daily.     Marland Kitchen donepezil (ARICEPT) 10 MG tablet Take 10 mg by mouth at bedtime.     . DULoxetine (CYMBALTA) 60 MG capsule Take 60 mg by mouth daily at 6 PM.      . esomeprazole (NEXIUM) 40 MG capsule Take 40 mg by mouth 2 (two) times daily.     . febuxostat (ULORIC) 40 MG tablet Take 20 mg by mouth daily at 6 PM.     . ferrous sulfate 325 (65 FE) MG tablet Take 325 mg by mouth daily.     . hydrALAZINE (APRESOLINE) 10 MG tablet Take 1 tablet (10 mg total) by mouth 3 (three) times daily.    Marland Kitchen HYDROcodone-acetaminophen (NORCO/VICODIN) 5-325 MG tablet Take 1 tablet by mouth 2 (two) times daily as needed (Pain - hold for sedation).    Marland Kitchen ipratropium-albuterol (DUONEB) 0.5-2.5 (3) MG/3ML SOLN Take 3 mLs by nebulization every 6 (six) hours as needed (wheezing/shortness of breath).    . isosorbide mononitrate (IMDUR) 30 MG 24 hr tablet Take 1 tablet (30 mg total) by mouth daily. 90 tablet 3  . levothyroxine (SYNTHROID, LEVOTHROID) 112 MCG tablet Take 112 mcg by mouth daily.     . Linaclotide (LINZESS) 145 MCG CAPS capsule Take 145 mcg by mouth daily.     . memantine (NAMENDA) 5 MG tablet Take 5 mg by mouth 2 (two) times daily.     . metolazone (ZAROXOLYN) 2.5 MG tablet Take one tablet by mouth 30 minutes prior to morning dosage of Torsemide if dry weight is 5 lb greater  than 168 lbs.  No more than twice per week. 90 tablet 3  . Multiple Vitamin (MULTIVITAMIN WITH MINERALS) TABS tablet Take 1 tablet by mouth daily.    . nitroGLYCERIN (NITROSTAT) 0.4 MG SL tablet Place 0.4 mg under the tongue every 5 (five) minutes as needed for chest pain (MAX 3 TABLETS).     . polyethylene glycol (MIRALAX / GLYCOLAX) packet Take 17 g by mouth 2 (two) times daily. 14 each 0  . potassium chloride (KLOR-CON) 20 MEQ packet Take 20 mEq by mouth daily.     . pramoxine (SARNA SENSITIVE) 1 % LOTN Apply 1 application topically as needed (itching).     . pregabalin (LYRICA) 25 MG capsule Take 25 mg by mouth 2 (two) times daily. 6AM and 6PM    . senna-docusate (SENOKOT-S) 8.6-50 MG tablet Take 1 tablet by mouth 2 (two) times daily. 30 tablet 0  . simethicone (MYLICON) 80 MG chewable tablet  Chew 80 mg by mouth every 12 (twelve) hours as needed for flatulence.     . torsemide (DEMADEX) 20 MG tablet Take 40 mg by mouth daily.     . vitamin B-12 (CYANOCOBALAMIN) 1000 MCG tablet Take 1,000 mcg by mouth 2 (two) times a week. Tuesday and Friday    . warfarin (COUMADIN) 2 MG tablet Take 2 mg by mouth daily.     No current facility-administered medications for this visit.    Facility-Administered Medications Ordered in Other Visits  Medication Dose Route Frequency Provider Last Rate Last Dose  . sodium bicarbonate first hour bolus via infusion   Intravenous Once Pearson Grippe, MD         Past Medical History:  Diagnosis Date  . Acute on chronic renal failure [facility])    sees Dr Allena Katz   . Anemia    Acute blood loss  . Aortic regurgitation   . Aortic stenosis 10/13/2012   Low EF, low gradient with severe aortic stenosis confirmed by dobutamine stress echocardiogram s/p TAVR 12/2012  . Asthma   . Atrial fibrillation (HCC)    tachy-brady syndrome with <1% recurrent PAF since pacemaker placement  . Cataracts, bilateral   . Chronic combined systolic and diastolic CHF (congestive heart failure) (HCC)   . Chronic lower back pain   . CKD (chronic kidney disease)   . Coronary artery disease involving native coronary artery of native heart   . Dementia    Without behavioral disturbance  . Dysrhythmia   . Fibromyalgia   . Gastroesophageal reflux disease   . H/O dizziness   . H/O urinary frequency   . H/O: stroke   . Hard of hearing   . Headache    hx of migraines   . Heart murmur   . History of blood transfusion   . History of bronchitis   . History of kidney stones   . History of urinary tract infection   . HLD (hyperlipidemia)   . HTN (hypertension)   . Hypothyroidism   . Neuropathy   . Nonischemic cardiomyopathy (HCC)   . On home oxygen therapy    patient uses at nite- 2L- has not used in > 6 months per patient  . Osteoarthritis   . Osteoporosis   . Pneumonia    hx of x 3    . PONV (postoperative nausea and vomiting)   . Presence of permanent cardiac pacemaker   . Pulmonary embolism (HCC)    HISTORY OF, the pt. had a recurrent bilateral pulmonary emboli in 2005,  on warfarin therapy and at which time she under went implantation of IVC filter  . Repeated falls   . Rupture of right patellar tendon   . Sciatica   . Shortness of breath dyspnea    with exertion   . Sleep apnea    uses oxygen at night and PRN- not used since > 6 months / DOES NOT USE  C-PAP  . Spinal stenosis   . Stroke (HCC)   . Symptomatic bradycardia   . Symptomatic bradycardia 2012   s/p Medtronic PPM  . Syncope   . Urinary incontinence   . Urinary urgency     ROS:   All systems reviewed and negative except as noted in the HPI.   Past Surgical History:  Procedure Laterality Date  . ABDOMINAL HYSTERECTOMY    . APPENDECTOMY    . BACK SURGERY    . BUNIONECTOMY    . CARDIAC CATHETERIZATION    . CATARACT EXTRACTION    . CENTRAL VENOUS CATHETER INSERTION Left 12/21/2012   Procedure: INSERTION CENTRAL LINE ADULT;  Surgeon: Tonny Bollman, MD;  Location: [facility] OR;  Service: Open Heart Surgery;  Laterality: Left;  . ELBOW SURGERY     bilat   . INTRAOPERATIVE TRANSESOPHAGEAL ECHOCARDIOGRAM N/A 12/21/2012   Procedure: INTRAOPERATIVE TRANSESOPHAGEAL ECHOCARDIOGRAM;  Surgeon: Tonny Bollman, MD;  Location: [facility] OR;  Service: Open Heart Surgery;  Laterality: N/A;  . KNEE SURGERY Left   . LEFT AND RIGHT HEART CATHETERIZATION WITH CORONARY ANGIOGRAM N/A 10/18/2012   Procedure: LEFT AND RIGHT HEART CATHETERIZATION WITH CORONARY ANGIOGRAM;  Surgeon: Tonny Bollman, MD;  Location: [facility] CATH LAB;  Service: Cardiovascular;  Laterality: N/A;  . NASAL SEPTUM SURGERY    . NOSE SURGERY     X 2  . ORIF PATELLA Right 03/08/2015   Procedure:  OPEN REDUCTION INTERNAL FIXATION RIGHT  PATELLA TENDON AVULSION;  Surgeon: Durene Romans, MD;  Location: [facility] ORS;  Service: Orthopedics;  Laterality: Right;  . PACEMAKER  INSERTION     Medtronic  . PATELLAR TENDON REPAIR Right 06/25/2015   Procedure: RIGHT PATELLA TENDON REVISION/REPAIR;  Surgeon: Durene Romans, MD;  Location: [facility] ORS;  Service: Orthopedics;  Laterality: Right;  . SHOULDER SURGERY     bilat   . THYROIDECTOMY, PARTIAL    . TONSILLECTOMY    . TOTAL KNEE ARTHROPLASTY Right 12/04/2014   Procedure: RIGHT TOTAL  KNEE ARTHROPLASTY;  Surgeon: Durene Romans, MD;  Location: [facility] ORS;  Service: Orthopedics;  Laterality: Right;  . TRANSCATHETER AORTIC VALVE REPLACEMENT, TRANSFEMORAL  12/21/2012   a. 29mm Edwards Sapien XT transcatheter heart valve placed via open left transfemoral approach b. Intra-op TEE: well-seated bioprosthetic aortic valve with mean gradient 2 mmHg, trivial AI, mild MR, EF 30-35%  . TRANSCATHETER AORTIC VALVE REPLACEMENT, TRANSFEMORAL N/A 12/21/2012   Procedure: TRANSCATHETER AORTIC VALVE REPLACEMENT, TRANSFEMORAL;  Surgeon: Tonny Bollman, MD;  Location[facility] OR;  Service: Open Heart Surgery;  Laterality: N/A;     Family History  Problem Relation Age of Onset  . Ovarian cancer Mother        Deceased  . Epilepsy Father        Deceased     Social History   Social History  . Marital status: Divorced    Spouse name: N/A  . Number of children: N/A  . Years of education: N/A   Occupational History  . Not on file.   Social History Main Topics  . Smoking status: Never Smoker  . Smokeless tobacco: Never Used  .  Alcohol use No  . Drug use: No  . Sexual activity: Not Currently   Other Topics Concern  . Not on file   Social History Narrative  . No narrative on file     BP (!) 142/78   Pulse 84   Ht 4' 9.5" (1.461 m)   Wt 166 lb (75.3 kg)   SpO2 92%   BMI 35.30 kg/m   Physical Exam:  Chronically ill but stable appearing 83 year old woman, NAD HEENT: Unremarkable Neck:  6 cm JVD, no thyromegally Back:  No CVA tenderness Lungs:  Clear with no wheezes, rales, or rhonchi. HEART:  Regular rate rhythm, no rubs, no clicks,  her aortic stenosis murmur has resolved. Abd:  soft, positive bowel sounds, no organomegally, no rebound, no guarding Ext:  2 plus pulses, no edema, no cyanosis, no clubbing Skin:  No rashes no nodules Neuro:  CN II through XII intact, motor grossly intact   DEVICE  Normal device function.  See PaceArt for details.   Assess/Plan: 1. Atrial fib - she remains in NSR. She will continue low dose amiodarone.  2. Chronic diastolic heart failure - she appears to be euvolemic on exam today and she will continue her current meds. 3. PPM - her Medtronic DDD PM is working normally. Will recheck in several months. 4. HTN - her blood pressure is a bit up today and I have asked her to reduce her salt intake. She may need uptitration of her beta blocker in the future.  Leonia Reeves.D.

## 2016-11-03 NOTE — Patient Instructions (Signed)
Medication Instructions:  Your physician recommends that you continue on your current medications as directed. Please refer to the Current Medication list given to you today.   Labwork: None Ordered   Testing/Procedures: None Ordered   Follow-Up: Your physician wants you to follow-up in: 1 year with Dr. Taylor. You will receive a reminder letter in the mail two months in advance. If you don't receive a letter, please call our office to schedule the follow-up appointment.  Remote monitoring is used to monitor your Pacemaker from home. This monitoring reduces the number of office visits required to check your device to one time per year. It allows us to keep an eye on the functioning of your device to ensure it is working properly. You are scheduled for a device check from home on  02/02/17 . You may send your transmission at any time that day. If you have a wireless device, the transmission will be sent automatically. After your physician reviews your transmission, you will receive a postcard with your next transmission date.    Any Other Special Instructions Will Be Listed Below (If Applicable).     If you need a refill on your cardiac medications before your next appointment, please call your pharmacy.   

## 2016-11-04 ENCOUNTER — Emergency Department (HOSPITAL_COMMUNITY): Payer: Medicare Other

## 2016-11-04 ENCOUNTER — Encounter (HOSPITAL_COMMUNITY): Payer: Self-pay | Admitting: Emergency Medicine

## 2016-11-04 ENCOUNTER — Emergency Department (HOSPITAL_COMMUNITY)
Admission: EM | Admit: 2016-11-04 | Discharge: 2016-11-04 | Disposition: A | Discharge status: Home / Self Care | Payer: Medicare Other | Attending: Emergency Medicine | Admitting: Emergency Medicine

## 2016-11-04 DIAGNOSIS — N184 Chronic kidney disease, stage 4 (severe): Secondary | ICD-10-CM | POA: Diagnosis not present

## 2016-11-04 DIAGNOSIS — I130 Hypertensive heart and chronic kidney disease with heart failure and stage 1 through stage 4 chronic kidney disease, or unspecified chronic kidney disease: Secondary | ICD-10-CM | POA: Insufficient documentation

## 2016-11-04 DIAGNOSIS — Y929 Unspecified place or not applicable: Secondary | ICD-10-CM | POA: Diagnosis not present

## 2016-11-04 DIAGNOSIS — Z7901 Long term (current) use of anticoagulants: Secondary | ICD-10-CM | POA: Insufficient documentation

## 2016-11-04 DIAGNOSIS — E039 Hypothyroidism, unspecified: Secondary | ICD-10-CM | POA: Insufficient documentation

## 2016-11-04 DIAGNOSIS — S59912A Unspecified injury of left forearm, initial encounter: Secondary | ICD-10-CM | POA: Diagnosis present

## 2016-11-04 DIAGNOSIS — S52002A Unspecified fracture of upper end of left ulna, initial encounter for closed fracture: Secondary | ICD-10-CM | POA: Insufficient documentation

## 2016-11-04 DIAGNOSIS — T148XXA Other injury of unspecified body region, initial encounter: Secondary | ICD-10-CM | POA: Diagnosis not present

## 2016-11-04 DIAGNOSIS — Z96651 Presence of right artificial knee joint: Secondary | ICD-10-CM | POA: Diagnosis not present

## 2016-11-04 DIAGNOSIS — W19XXXA Unspecified fall, initial encounter: Secondary | ICD-10-CM | POA: Insufficient documentation

## 2016-11-04 DIAGNOSIS — Z79899 Other long term (current) drug therapy: Secondary | ICD-10-CM | POA: Diagnosis not present

## 2016-11-04 DIAGNOSIS — Z950 Presence of cardiac pacemaker: Secondary | ICD-10-CM | POA: Diagnosis not present

## 2016-11-04 DIAGNOSIS — I2510 Atherosclerotic heart disease of native coronary artery without angina pectoris: Secondary | ICD-10-CM | POA: Diagnosis not present

## 2016-11-04 DIAGNOSIS — Y939 Activity, unspecified: Secondary | ICD-10-CM | POA: Insufficient documentation

## 2016-11-04 DIAGNOSIS — M79632 Pain in left forearm: Secondary | ICD-10-CM | POA: Diagnosis not present

## 2016-11-04 DIAGNOSIS — J45909 Unspecified asthma, uncomplicated: Secondary | ICD-10-CM | POA: Diagnosis not present

## 2016-11-04 DIAGNOSIS — M79603 Pain in arm, unspecified: Secondary | ICD-10-CM | POA: Diagnosis not present

## 2016-11-04 DIAGNOSIS — I5042 Chronic combined systolic (congestive) and diastolic (congestive) heart failure: Secondary | ICD-10-CM | POA: Insufficient documentation

## 2016-11-04 DIAGNOSIS — S52615A Nondisplaced fracture of left ulna styloid process, initial encounter for closed fracture: Secondary | ICD-10-CM | POA: Diagnosis not present

## 2016-11-04 DIAGNOSIS — Y999 Unspecified external cause status: Secondary | ICD-10-CM | POA: Insufficient documentation

## 2016-11-04 DIAGNOSIS — S52202A Unspecified fracture of shaft of left ulna, initial encounter for closed fracture: Secondary | ICD-10-CM | POA: Diagnosis not present

## 2016-11-04 NOTE — ED Notes (Signed)
Discharge instructions reviewed with patient. Patient verbalizes understanding. VSS.   

## 2016-11-04 NOTE — ED Provider Notes (Signed)
[facility]-EMERGENCY DEPT Provider Note   CSN: [id number] Arrival date & time: 11/04/16  1422     History   Chief Complaint Chief Complaint  Patient presents with  . Arm Injury  . sent due to xray showing fracture    HPI [name] is a 83 y.o. female.  The history is provided by the patient. No language interpreter was used.  Arm Injury   This is a new problem. The problem occurs constantly. The problem has been gradually worsening.  Pt reports she fell yesterday.  Pt complains of pain with moving arm.  Pt had an xray at facility today that showed a fracture.  Pt sent here for evaluation  Past Medical History:  Diagnosis Date  . Acute on chronic renal failure [facility])    sees Dr Allena Katz   . Anemia    Acute blood loss  . Aortic regurgitation   . Aortic stenosis 10/13/2012   Low EF, low gradient with severe aortic stenosis confirmed by dobutamine stress echocardiogram s/p TAVR 12/2012  . Asthma   . Atrial fibrillation (HCC)    tachy-brady syndrome with <1% recurrent PAF since pacemaker placement  . Cataracts, bilateral   . Chronic combined systolic and diastolic CHF (congestive heart failure) (HCC)   . Chronic lower back pain   . CKD (chronic kidney disease)   . Coronary artery disease involving native coronary artery of native heart   . Dementia    Without behavioral disturbance  . Dysrhythmia   . Fibromyalgia   . Gastroesophageal reflux disease   . H/O dizziness   . H/O urinary frequency   . H/O: stroke   . Hard of hearing   . Headache    hx of migraines   . Heart murmur   . History of blood transfusion   . History of bronchitis   . History of kidney stones   . History of urinary tract infection   . HLD (hyperlipidemia)   . HTN (hypertension)   . Hypothyroidism   . Neuropathy   . Nonischemic cardiomyopathy (HCC)   . On home oxygen therapy    patient uses at nite- 2L- has not used in > 6 months per patient  . Osteoarthritis   . Osteoporosis   . Pneumonia    hx of x 3   . PONV (postoperative nausea and vomiting)   . Presence of permanent cardiac pacemaker   . Pulmonary embolism (HCC)    HISTORY OF, the pt. had a recurrent bilateral pulmonary emboli in 2005, on warfarin therapy and at which time she under went implantation of IVC filter  . Repeated falls   . Rupture of right patellar tendon   . Sciatica   . Shortness of breath dyspnea    with exertion   . Sleep apnea    uses oxygen at night and PRN- not used since > 6 months / DOES NOT USE  C-PAP  . Spinal stenosis   . Stroke (HCC)   . Symptomatic bradycardia   . Symptomatic bradycardia 2012   s/p Medtronic PPM  . Syncope   . Urinary incontinence   . Urinary urgency     Patient Active Problem List   Diagnosis Date Noted  . Anticoagulation management encounter 09/29/2016  . Respiratory failure (HCC) 07/21/2016  . Acute on chronic respiratory failure with hypoxia (HCC) 07/21/2016  . Head contusion 07/15/2016  . Fall 07/14/2016  . Chest pain at rest   . Acute on chronic combined systolic and  diastolic congestive heart failure (HCC)   . Unstable angina (HCC)   . Acute respiratory failure (HCC) 06/13/2016  . Elevated blood sugar 06/13/2016  . Dementia without behavioral disturbance 01/09/2016  . Chronic constipation 01/09/2016  . Neuropathic pain 01/09/2016  . Esophageal reflux 01/09/2016  . Hypothyroidism 01/09/2016  . CKD (chronic kidney disease) stage 4, GFR 15-29 ml/min (HCC) 01/09/2016  . Right patellar tendon rupture 06/25/2015  . Avulsion of right patellar tendon 03/08/2015  . Aspiration pneumonia (HCC) 12/14/2014  . Anemia of chronic disease 12/14/2014  . Aspiration into airway 12/14/2014  . Swallowing dysfunction 12/14/2014  . S/P right TKA 12/04/2014  . S/P knee replacement 12/04/2014  . Acute blood loss anemia 12/28/2012  . Thrombocytopenia (HCC) 12/28/2012  . Toe fracture 12/28/2012  . CKD (chronic kidney disease), stage III 12/28/2012  . Low back pain 12/28/2012   . Wound dehiscence, surgical 12/28/2012  . Atelectasis 12/28/2012  . S/P TAVR (transcatheter aortic valve replacement) 12/21/2012  . Severe aortic stenosis 12/06/2012  . Aortic stenosis 10/13/2012  . Acute on chronic systolic and diastolic heart failure, NYHA class 3 (HCC) 10/13/2012  . Pacemaker 10/15/2010  . Paroxysmal atrial fibrillation (HCC) 10/15/2010  . Dyslipidemia 10/15/2010  . Hypertension 10/15/2010  . Chronic diastolic heart failure (HCC) 10/15/2010    Past Surgical History:  Procedure Laterality Date  . ABDOMINAL HYSTERECTOMY    . APPENDECTOMY    . BACK SURGERY    . BUNIONECTOMY    . CARDIAC CATHETERIZATION    . CATARACT EXTRACTION    . CENTRAL VENOUS CATHETER INSERTION Left 12/21/2012   Procedure: INSERTION CENTRAL LINE ADULT;  Surgeon: Tonny Bollman, MD;  Location: [facility] OR;  Service: Open Heart Surgery;  Laterality: Left;  . ELBOW SURGERY     bilat   . INTRAOPERATIVE TRANSESOPHAGEAL ECHOCARDIOGRAM N/A 12/21/2012   Procedure: INTRAOPERATIVE TRANSESOPHAGEAL ECHOCARDIOGRAM;  Surgeon: Tonny Bollman, MD;  Location: [facility] OR;  Service: Open Heart Surgery;  Laterality: N/A;  . KNEE SURGERY Left   . LEFT AND RIGHT HEART CATHETERIZATION WITH CORONARY ANGIOGRAM N/A 10/18/2012   Procedure: LEFT AND RIGHT HEART CATHETERIZATION WITH CORONARY ANGIOGRAM;  Surgeon: Tonny Bollman, MD;  Location: [facility] CATH LAB;  Service: Cardiovascular;  Laterality: N/A;  . NASAL SEPTUM SURGERY    . NOSE SURGERY     X 2  . ORIF PATELLA Right 03/08/2015   Procedure:  OPEN REDUCTION INTERNAL FIXATION RIGHT  PATELLA TENDON AVULSION;  Surgeon: Durene Romans, MD;  Location: [facility] ORS;  Service: Orthopedics;  Laterality: Right;  . PACEMAKER INSERTION     Medtronic  . PATELLAR TENDON REPAIR Right 06/25/2015   Procedure: RIGHT PATELLA TENDON REVISION/REPAIR;  Surgeon: Durene Romans, MD;  Location: [facility] ORS;  Service: Orthopedics;  Laterality: Right;  . SHOULDER SURGERY     bilat   . THYROIDECTOMY, PARTIAL    .  TONSILLECTOMY    . TOTAL KNEE ARTHROPLASTY Right 12/04/2014   Procedure: RIGHT TOTAL  KNEE ARTHROPLASTY;  Surgeon: Durene Romans, MD;  Location: [facility] ORS;  Service: Orthopedics;  Laterality: Right;  . TRANSCATHETER AORTIC VALVE REPLACEMENT, TRANSFEMORAL  12/21/2012   a. 29mm Edwards Sapien XT transcatheter heart valve placed via open left transfemoral approach b. Intra-op TEE: well-seated bioprosthetic aortic valve with mean gradient 2 mmHg, trivial AI, mild MR, EF 30-35%  . TRANSCATHETER AORTIC VALVE REPLACEMENT, TRANSFEMORAL N/A 12/21/2012   Procedure: TRANSCATHETER AORTIC VALVE REPLACEMENT, TRANSFEMORAL;  Surgeon: Tonny Bollman, MD;  Location: [facility] OR;  Service: Open Heart Surgery;  Laterality: N/A;  OB History    No data available       Home Medications    Prior to Admission medications   Medication Sig Start Date End Date Taking? Authorizing Provider  esomeprazole (NEXIUM) 40 MG capsule Take 40 mg by mouth daily.    Yes [provider]  metolazone (ZAROXOLYN) 2.5 MG tablet Take one tablet by mouth 30 minutes prior to morning dosage of Torsemide if dry weight is 5 lb greater than 168 lbs.  No more than twice per week. 10/08/16  Yes Tonny Bollman, MD  multivitamin (RENA-VIT) TABS tablet Take 1 tablet by mouth daily.    Yes [provider]  acetaminophen (TYLENOL) 325 MG tablet Take 650 mg by mouth every 4 (four) hours as needed for mild pain.    [provider]  alendronate (FOSAMAX) 70 MG/75ML solution Take 70 mg by mouth every Saturday. Take on an empty stomach with a full glass of fluids, remain upright 30 minutes after administration - for bone health. Give 75 mL    [provider]  amiodarone (PACERONE) 100 MG tablet Take 100 mg by mouth daily.    [provider]  atorvastatin (LIPITOR) 10 MG tablet Take 10 mg by mouth daily at 6 PM.     [provider]  Biotin 5000 MCG CAPS Take 5,000 mcg by mouth daily at 6 PM.    [provider]  bisacodyl (DULCOLAX) 10 MG suppository Place 10 mg rectally daily as needed (constipation).     [provider]  calcitRIOL (ROCALTROL) 0.25 MCG capsule Take 0.25 mcg by mouth daily.     [provider]  carvedilol (COREG) 3.125 MG tablet Take 1 tablet (3.125 mg total) by mouth 2 (two) times daily with a meal. 07/17/16   Penny Pia, MD  cholecalciferol (VITAMIN D) 1000 units tablet Take 2,000 Units by mouth at bedtime.    [provider]  cycloSPORINE (RESTASIS) 0.05 % ophthalmic emulsion Place 1 drop into both eyes 2 (two) times daily.     [provider]  donepezil (ARICEPT) 10 MG tablet Take 10 mg by mouth at bedtime.     [provider]  DULoxetine (CYMBALTA) 60 MG capsule Take 60 mg by mouth daily at 6 PM.     [provider]  febuxostat (ULORIC) 40 MG tablet Take 20 mg by mouth daily at 6 PM.     [provider]  ferrous sulfate 325 (65 FE) MG tablet Take 325 mg by mouth daily.     [provider]  hydrALAZINE (APRESOLINE) 10 MG tablet Take 1 tablet (10 mg total) by mouth 3 (three) times daily. 07/17/16   Penny Pia, MD  HYDROcodone-acetaminophen (NORCO/VICODIN) 5-325 MG tablet Take 1 tablet by mouth 2 (two) times daily as needed (Pain - hold for sedation).    [provider]  ipratropium-albuterol (DUONEB) 0.5-2.5 (3) MG/3ML SOLN Take 3 mLs by nebulization every 6 (six) hours as needed (wheezing/shortness of breath).    [provider]  isosorbide mononitrate (IMDUR) 30 MG 24 hr tablet Take 1 tablet (30 mg total) by mouth daily. 08/05/16 11/03/16  Manson Passey, PA  levothyroxine (SYNTHROID, LEVOTHROID) 112 MCG tablet Take 112 mcg by mouth daily.     [provider]  Linaclotide Karlene Einstein) 145 MCG CAPS capsule Take 145 mcg by mouth daily.     [provider]  memantine (NAMENDA) 5 MG tablet Take 5 mg by mouth 2 (two) times daily.  [provider]    Multiple Vitamin (MULTIVITAMIN WITH MINERALS) TABS tablet Take 1 tablet by mouth daily.    [provider]  nitroGLYCERIN (NITROSTAT) 0.4 MG SL tablet Place 0.4 mg under the tongue every 5 (five) minutes as needed for chest pain (MAX 3 TABLETS).     [provider]  polyethylene glycol (MIRALAX / GLYCOLAX) packet Take 17 g by mouth 2 (two) times daily. 07/30/16   Rolly Salter, MD  potassium chloride (KLOR-CON) 20 MEQ packet Take 20 mEq by mouth daily.     [provider]  pramoxine (SARNA SENSITIVE) 1 % LOTN Apply 1 application topically as needed (itching).     [provider]  pregabalin (LYRICA) 25 MG capsule Take 25 mg by mouth 2 (two) times daily. 6AM and 6PM    [provider]  senna-docusate (SENOKOT-S) 8.6-50 MG tablet Take 1 tablet by mouth 2 (two) times daily. 07/30/16   Rolly Salter, MD  simethicone (MYLICON) 80 MG chewable tablet Chew 80 mg by mouth every 12 (twelve) hours as needed for flatulence.     [provider]  torsemide (DEMADEX) 20 MG tablet Take 40 mg by mouth daily.     [provider]  vitamin B-12 (CYANOCOBALAMIN) 1000 MCG tablet Take 1,000 mcg by mouth 2 (two) times a week. Tuesday and Friday    [provider]  warfarin (COUMADIN) 2 MG tablet Take 2 mg by mouth daily.    [provider]    Family History Family History  Problem Relation Age of Onset  . Ovarian cancer Mother        Deceased  . Epilepsy Father        Deceased    Social History Social History  Substance Use Topics  . Smoking status: Never Smoker  . Smokeless tobacco: Never Used  . Alcohol use No     Allergies   Nubain [nalbuphine hcl]; Codeine; and Darvocet [propoxyphene n-acetaminophen]   Review of Systems Review of Systems  All other systems reviewed and are negative.    Physical Exam Updated Vital Signs BP (!) 187/87 (BP Location: Right Arm)   Pulse 81   Temp 97.7 F (36.5 C) (Oral) Comment:  Pt just drank cold water  Resp 20   SpO2 96%   Physical Exam  Constitutional: She appears well-developed and well-nourished.  HENT:  Head: Normocephalic.  Musculoskeletal: She exhibits tenderness.  Swollen distal wrist, pain with range of motion,  nv and ns intact  Neurological: She is alert. No sensory deficit.  Skin: Skin is warm.  Psychiatric: She has a normal mood and affect.  Nursing note and vitals reviewed.    ED Treatments / Results  Labs (all labs ordered are listed, but only abnormal results are displayed) Labs Reviewed - No data to display  EKG  EKG Interpretation None       Radiology Dg Forearm Left  Result Date: 11/04/2016 CLINICAL DATA:  Patient fell and injured left arm. EXAM: LEFT FOREARM - 2 VIEW COMPARISON:  None. FINDINGS: There is a nondisplaced fracture involving the distal shaft of the ulna. No radius fracture is identified. Moderate wrist joint degenerative changes. Possible old distal radius fracture. The elbow joint is maintained. Moderate degenerative changes and chondrocalcinosis. IMPRESSION: Nondisplaced distal ulnar shaft fracture. Suspect remote healed distal radius fracture. No definite acute fracture. Posttraumatic degenerative changes are noted. Electronically Signed   By: Rudie Meyer M.D.   On: 11/04/2016 17:03    Procedures  Procedures (including critical care time)  Medications Ordered in ED Medications - No data to display   Initial Impression / Assessment and Plan / ED Course  I have reviewed the triage vital signs and the nursing notes.  Pertinent labs & imaging results that were available during my care of the patient were reviewed by me and considered in my medical decision making (see chart for details).       Final Clinical Impressions(s) / ED Diagnoses   Final diagnoses:  Closed fracture of proximal end of left ulna, unspecified fracture morphology, initial encounter    New Prescriptions New Prescriptions   No  medications on file   Splint Ice Elevate Follow up with Dr. Eulah Pont for recheck.   An After Visit Summary was printed and given to the patient.    Elson Areas, [place] 11/04/16 1717    Nira Conn, MD 11/05/16 [phone]

## 2016-11-04 NOTE — Progress Notes (Signed)
Orthopedic Tech Progress Note Patient Details:  [name] [birth year] [id number]  Ortho Devices Type of Ortho Device: Ace wrap, Sugartong splint, Arm sling   [name] 11/04/2016, 5:45 PM

## 2016-11-04 NOTE — ED Triage Notes (Signed)
Per [facility] patient comes from [facility] for fracture shown on xray that was done today at facility due to patient falling yesterday when out with her family.

## 2016-11-04 NOTE — ED Notes (Signed)
Ortho tech called regarding patient left arm sugar tong splint application. Tech states they are en route.

## 2016-11-05 DIAGNOSIS — R278 Other lack of coordination: Secondary | ICD-10-CM | POA: Diagnosis not present

## 2016-11-05 DIAGNOSIS — M6281 Muscle weakness (generalized): Secondary | ICD-10-CM | POA: Diagnosis not present

## 2016-11-06 DIAGNOSIS — M6281 Muscle weakness (generalized): Secondary | ICD-10-CM | POA: Diagnosis not present

## 2016-11-06 DIAGNOSIS — R278 Other lack of coordination: Secondary | ICD-10-CM | POA: Diagnosis not present

## 2016-11-07 DIAGNOSIS — R278 Other lack of coordination: Secondary | ICD-10-CM | POA: Diagnosis not present

## 2016-11-07 DIAGNOSIS — M109 Gout, unspecified: Secondary | ICD-10-CM | POA: Diagnosis not present

## 2016-11-07 DIAGNOSIS — I4891 Unspecified atrial fibrillation: Secondary | ICD-10-CM | POA: Diagnosis not present

## 2016-11-07 DIAGNOSIS — M6281 Muscle weakness (generalized): Secondary | ICD-10-CM | POA: Diagnosis not present

## 2016-11-10 DIAGNOSIS — R278 Other lack of coordination: Secondary | ICD-10-CM | POA: Diagnosis not present

## 2016-11-10 DIAGNOSIS — M6281 Muscle weakness (generalized): Secondary | ICD-10-CM | POA: Diagnosis not present

## 2016-11-11 DIAGNOSIS — S52135A Nondisplaced fracture of neck of left radius, initial encounter for closed fracture: Secondary | ICD-10-CM | POA: Diagnosis not present

## 2016-11-11 DIAGNOSIS — S52225A Nondisplaced transverse fracture of shaft of left ulna, initial encounter for closed fracture: Secondary | ICD-10-CM | POA: Diagnosis not present

## 2016-11-11 DIAGNOSIS — M6281 Muscle weakness (generalized): Secondary | ICD-10-CM | POA: Diagnosis not present

## 2016-11-11 DIAGNOSIS — R278 Other lack of coordination: Secondary | ICD-10-CM | POA: Diagnosis not present

## 2016-11-11 DIAGNOSIS — M25522 Pain in left elbow: Secondary | ICD-10-CM | POA: Diagnosis not present

## 2016-11-12 DIAGNOSIS — M6281 Muscle weakness (generalized): Secondary | ICD-10-CM | POA: Diagnosis not present

## 2016-11-12 DIAGNOSIS — R252 Cramp and spasm: Secondary | ICD-10-CM | POA: Diagnosis not present

## 2016-11-12 DIAGNOSIS — R278 Other lack of coordination: Secondary | ICD-10-CM | POA: Diagnosis not present

## 2016-11-13 ENCOUNTER — Ambulatory Visit: Payer: Medicare Other | Admitting: Cardiovascular Disease

## 2016-11-13 DIAGNOSIS — M6281 Muscle weakness (generalized): Secondary | ICD-10-CM | POA: Diagnosis not present

## 2016-11-13 DIAGNOSIS — R278 Other lack of coordination: Secondary | ICD-10-CM | POA: Diagnosis not present

## 2016-11-14 DIAGNOSIS — M6281 Muscle weakness (generalized): Secondary | ICD-10-CM | POA: Diagnosis not present

## 2016-11-14 DIAGNOSIS — R278 Other lack of coordination: Secondary | ICD-10-CM | POA: Diagnosis not present

## 2016-11-17 DIAGNOSIS — R278 Other lack of coordination: Secondary | ICD-10-CM | POA: Diagnosis not present

## 2016-11-17 DIAGNOSIS — M6281 Muscle weakness (generalized): Secondary | ICD-10-CM | POA: Diagnosis not present

## 2016-11-18 DIAGNOSIS — M6281 Muscle weakness (generalized): Secondary | ICD-10-CM | POA: Diagnosis not present

## 2016-11-18 DIAGNOSIS — R278 Other lack of coordination: Secondary | ICD-10-CM | POA: Diagnosis not present

## 2016-11-19 DIAGNOSIS — M6281 Muscle weakness (generalized): Secondary | ICD-10-CM | POA: Diagnosis not present

## 2016-11-20 DIAGNOSIS — M6281 Muscle weakness (generalized): Secondary | ICD-10-CM | POA: Diagnosis not present

## 2016-11-21 DIAGNOSIS — M6281 Muscle weakness (generalized): Secondary | ICD-10-CM | POA: Diagnosis not present

## 2016-11-22 DIAGNOSIS — M6281 Muscle weakness (generalized): Secondary | ICD-10-CM | POA: Diagnosis not present

## 2016-11-24 DIAGNOSIS — M6281 Muscle weakness (generalized): Secondary | ICD-10-CM | POA: Diagnosis not present

## 2016-11-25 DIAGNOSIS — M6281 Muscle weakness (generalized): Secondary | ICD-10-CM | POA: Diagnosis not present

## 2016-11-26 DIAGNOSIS — M6281 Muscle weakness (generalized): Secondary | ICD-10-CM | POA: Diagnosis not present

## 2016-11-27 DIAGNOSIS — M6281 Muscle weakness (generalized): Secondary | ICD-10-CM | POA: Diagnosis not present

## 2016-11-28 DIAGNOSIS — M6281 Muscle weakness (generalized): Secondary | ICD-10-CM | POA: Diagnosis not present

## 2016-11-29 DIAGNOSIS — M6281 Muscle weakness (generalized): Secondary | ICD-10-CM | POA: Diagnosis not present

## 2016-12-01 DIAGNOSIS — J309 Allergic rhinitis, unspecified: Secondary | ICD-10-CM | POA: Diagnosis not present

## 2016-12-01 DIAGNOSIS — M6281 Muscle weakness (generalized): Secondary | ICD-10-CM | POA: Diagnosis not present

## 2016-12-02 DIAGNOSIS — S52135D Nondisplaced fracture of neck of left radius, subsequent encounter for closed fracture with routine healing: Secondary | ICD-10-CM | POA: Diagnosis not present

## 2016-12-02 DIAGNOSIS — M6281 Muscle weakness (generalized): Secondary | ICD-10-CM | POA: Diagnosis not present

## 2016-12-03 DIAGNOSIS — M6281 Muscle weakness (generalized): Secondary | ICD-10-CM | POA: Diagnosis not present

## 2016-12-04 DIAGNOSIS — M6281 Muscle weakness (generalized): Secondary | ICD-10-CM | POA: Diagnosis not present

## 2016-12-05 DIAGNOSIS — M6281 Muscle weakness (generalized): Secondary | ICD-10-CM | POA: Diagnosis not present

## 2016-12-07 DIAGNOSIS — M6281 Muscle weakness (generalized): Secondary | ICD-10-CM | POA: Diagnosis not present

## 2016-12-08 DIAGNOSIS — M6281 Muscle weakness (generalized): Secondary | ICD-10-CM | POA: Diagnosis not present

## 2016-12-09 DIAGNOSIS — M6281 Muscle weakness (generalized): Secondary | ICD-10-CM | POA: Diagnosis not present

## 2016-12-10 DIAGNOSIS — M6281 Muscle weakness (generalized): Secondary | ICD-10-CM | POA: Diagnosis not present

## 2016-12-11 DIAGNOSIS — M6281 Muscle weakness (generalized): Secondary | ICD-10-CM | POA: Diagnosis not present

## 2016-12-12 DIAGNOSIS — M6281 Muscle weakness (generalized): Secondary | ICD-10-CM | POA: Diagnosis not present

## 2016-12-15 DIAGNOSIS — M6281 Muscle weakness (generalized): Secondary | ICD-10-CM | POA: Diagnosis not present

## 2016-12-16 DIAGNOSIS — M6281 Muscle weakness (generalized): Secondary | ICD-10-CM | POA: Diagnosis not present

## 2016-12-17 DIAGNOSIS — M6281 Muscle weakness (generalized): Secondary | ICD-10-CM | POA: Diagnosis not present

## 2016-12-18 DIAGNOSIS — M6281 Muscle weakness (generalized): Secondary | ICD-10-CM | POA: Diagnosis not present

## 2016-12-19 DIAGNOSIS — M6281 Muscle weakness (generalized): Secondary | ICD-10-CM | POA: Diagnosis not present

## 2016-12-22 DIAGNOSIS — R278 Other lack of coordination: Secondary | ICD-10-CM | POA: Diagnosis not present

## 2016-12-23 DIAGNOSIS — R278 Other lack of coordination: Secondary | ICD-10-CM | POA: Diagnosis not present

## 2016-12-24 DIAGNOSIS — R278 Other lack of coordination: Secondary | ICD-10-CM | POA: Diagnosis not present

## 2016-12-25 DIAGNOSIS — Z79899 Other long term (current) drug therapy: Secondary | ICD-10-CM | POA: Diagnosis not present

## 2016-12-25 DIAGNOSIS — R278 Other lack of coordination: Secondary | ICD-10-CM | POA: Diagnosis not present

## 2016-12-26 DIAGNOSIS — R278 Other lack of coordination: Secondary | ICD-10-CM | POA: Diagnosis not present

## 2016-12-29 DIAGNOSIS — R278 Other lack of coordination: Secondary | ICD-10-CM | POA: Diagnosis not present

## 2016-12-30 DIAGNOSIS — R278 Other lack of coordination: Secondary | ICD-10-CM | POA: Diagnosis not present

## 2016-12-31 DIAGNOSIS — R278 Other lack of coordination: Secondary | ICD-10-CM | POA: Diagnosis not present

## 2017-01-01 DIAGNOSIS — I509 Heart failure, unspecified: Secondary | ICD-10-CM | POA: Diagnosis not present

## 2017-01-01 DIAGNOSIS — N189 Chronic kidney disease, unspecified: Secondary | ICD-10-CM | POA: Diagnosis not present

## 2017-01-01 DIAGNOSIS — R278 Other lack of coordination: Secondary | ICD-10-CM | POA: Diagnosis not present

## 2017-01-01 DIAGNOSIS — I4891 Unspecified atrial fibrillation: Secondary | ICD-10-CM | POA: Diagnosis not present

## 2017-01-01 DIAGNOSIS — Z952 Presence of prosthetic heart valve: Secondary | ICD-10-CM | POA: Diagnosis not present

## 2017-01-02 DIAGNOSIS — R278 Other lack of coordination: Secondary | ICD-10-CM | POA: Diagnosis not present

## 2017-01-05 DIAGNOSIS — R278 Other lack of coordination: Secondary | ICD-10-CM | POA: Diagnosis not present

## 2017-01-05 DIAGNOSIS — S52135D Nondisplaced fracture of neck of left radius, subsequent encounter for closed fracture with routine healing: Secondary | ICD-10-CM | POA: Diagnosis not present

## 2017-01-06 DIAGNOSIS — R278 Other lack of coordination: Secondary | ICD-10-CM | POA: Diagnosis not present

## 2017-01-07 DIAGNOSIS — R278 Other lack of coordination: Secondary | ICD-10-CM | POA: Diagnosis not present

## 2017-01-08 DIAGNOSIS — I5043 Acute on chronic combined systolic (congestive) and diastolic (congestive) heart failure: Secondary | ICD-10-CM | POA: Diagnosis not present

## 2017-01-08 DIAGNOSIS — R278 Other lack of coordination: Secondary | ICD-10-CM | POA: Diagnosis not present

## 2017-01-09 DIAGNOSIS — R278 Other lack of coordination: Secondary | ICD-10-CM | POA: Diagnosis not present

## 2017-01-11 DIAGNOSIS — R278 Other lack of coordination: Secondary | ICD-10-CM | POA: Diagnosis not present

## 2017-01-12 DIAGNOSIS — M25512 Pain in left shoulder: Secondary | ICD-10-CM | POA: Diagnosis not present

## 2017-01-14 DIAGNOSIS — R278 Other lack of coordination: Secondary | ICD-10-CM | POA: Diagnosis not present

## 2017-01-19 DIAGNOSIS — R0982 Postnasal drip: Secondary | ICD-10-CM | POA: Diagnosis not present

## 2017-01-19 DIAGNOSIS — R05 Cough: Secondary | ICD-10-CM | POA: Diagnosis not present

## 2017-01-19 DIAGNOSIS — J988 Other specified respiratory disorders: Secondary | ICD-10-CM | POA: Diagnosis not present

## 2017-01-19 NOTE — Progress Notes (Signed)
Cardiology Office Note    Date:  01/21/2017   ID:  [name], DOB Jan 03, 1933, MRN [id number]  PCP:  Pearson Grippe, MD  Cardiologist:  Dr. Excell Seltzer Electrophysiologist: Dr. Ladona Ridgel  Chief Complaint: 3 months follow up for TVAR  History of Present Illness:   [name] is a 83 y.o. female w/ hx NICM, chronic systolic heart failure, and severe AS s/p TAVR 12/21/2012 w/ 29 mm Edwards Sapien XT valve, paroxysmalatrial fibrillation, HTN, CKD stage IV, oxygen dependent chronic respiratory failure, chronic anticoagulation w/ warfarin s/p PPM by Dr. Ladona Ridgel presents for follow up.   Patient has multiple admission beginning of this year (2018) for CHF. Last seen by Dr. Excell Seltzer 10/08/16. Advised to take metolazone 2.5mg  when weight is >5lb on dry weight of 168lb.   Here today for follow up. She lives at [place] in place. Recently filled amiodarone by provider at facility. No current list of medication from facility. No complaints. She denies chest pain, lower extremity edema, orthopnea, PND, syncope, dizziness, melena or blood in her stool or urine. Intermittent dyspnea on exertion which improved from baseline in past few months. Requires metolazone once or twice per month.  Past Medical History:  Diagnosis Date  . Acute on chronic renal failure [facility])    sees Dr Allena Katz   . Anemia    Acute blood loss  . Aortic regurgitation   . Aortic stenosis 10/13/2012   Low EF, low gradient with severe aortic stenosis confirmed by dobutamine stress echocardiogram s/p TAVR 12/2012  . Asthma   . Atrial fibrillation (HCC)    tachy-brady syndrome with <1% recurrent PAF since pacemaker placement  . Cataracts, bilateral   . Chronic combined systolic and diastolic CHF (congestive heart failure) (HCC)   . Chronic lower back pain   . CKD (chronic kidney disease)   . Coronary artery disease involving native coronary artery of native heart   . Dementia    Without behavioral disturbance  . Dysrhythmia   .  Fibromyalgia   . Gastroesophageal reflux disease   . H/O dizziness   . H/O urinary frequency   . H/O: stroke   . Hard of hearing   . Headache    hx of migraines   . Heart murmur   . History of blood transfusion   . History of bronchitis   . History of kidney stones   . History of urinary tract infection   . HLD (hyperlipidemia)   . HTN (hypertension)   . Hypothyroidism   . Neuropathy   . Nonischemic cardiomyopathy (HCC)   . On home oxygen therapy    patient uses at nite- 2L- has not used in > 6 months per patient  . Osteoarthritis   . Osteoporosis   . Pneumonia    hx of x 3   . PONV (postoperative nausea and vomiting)   . Presence of permanent cardiac pacemaker   . Pulmonary embolism (HCC)    HISTORY OF, the pt. had a recurrent bilateral pulmonary emboli in 2005, on warfarin therapy and at which time she under went implantation of IVC filter  . Repeated falls   . Rupture of right patellar tendon   . Sciatica   . Shortness of breath dyspnea    with exertion   . Sleep apnea    uses oxygen at night and PRN- not used since > 6 months / DOES NOT USE  C-PAP  . Spinal stenosis   . Stroke (HCC)   . Symptomatic  bradycardia   . Symptomatic bradycardia 2012   s/p Medtronic PPM  . Syncope   . Urinary incontinence   . Urinary urgency     Past Surgical History:  Procedure Laterality Date  . ABDOMINAL HYSTERECTOMY    . APPENDECTOMY    . BACK SURGERY    . BUNIONECTOMY    . CARDIAC CATHETERIZATION    . CATARACT EXTRACTION    . CENTRAL VENOUS CATHETER INSERTION Left 12/21/2012   Procedure: INSERTION CENTRAL LINE ADULT;  Surgeon: Tonny Bollman, MD;  Location: [facility] OR;  Service: Open Heart Surgery;  Laterality: Left;  . ELBOW SURGERY     bilat   . INTRAOPERATIVE TRANSESOPHAGEAL ECHOCARDIOGRAM N/A 12/21/2012   Procedure: INTRAOPERATIVE TRANSESOPHAGEAL ECHOCARDIOGRAM;  Surgeon: Tonny Bollman, MD;  Location: [facility] OR;  Service: Open Heart Surgery;  Laterality: N/A;  . KNEE SURGERY Left     . LEFT AND RIGHT HEART CATHETERIZATION WITH CORONARY ANGIOGRAM N/A 10/18/2012   Procedure: LEFT AND RIGHT HEART CATHETERIZATION WITH CORONARY ANGIOGRAM;  Surgeon: Tonny Bollman, MD;  Location: [facility] CATH LAB;  Service: Cardiovascular;  Laterality: N/A;  . NASAL SEPTUM SURGERY    . NOSE SURGERY     X 2  . ORIF PATELLA Right 03/08/2015   Procedure:  OPEN REDUCTION INTERNAL FIXATION RIGHT  PATELLA TENDON AVULSION;  Surgeon: Durene Romans, MD;  Location: [facility] ORS;  Service: Orthopedics;  Laterality: Right;  . PACEMAKER INSERTION     Medtronic  . PATELLAR TENDON REPAIR Right 06/25/2015   Procedure: RIGHT PATELLA TENDON REVISION/REPAIR;  Surgeon: Durene Romans, MD;  Location: [facility] ORS;  Service: Orthopedics;  Laterality: Right;  . SHOULDER SURGERY     bilat   . THYROIDECTOMY, PARTIAL    . TONSILLECTOMY    . TOTAL KNEE ARTHROPLASTY Right 12/04/2014   Procedure: RIGHT TOTAL  KNEE ARTHROPLASTY;  Surgeon: Durene Romans, MD;  Location: [facility] ORS;  Service: Orthopedics;  Laterality: Right;  . TRANSCATHETER AORTIC VALVE REPLACEMENT, TRANSFEMORAL  12/21/2012   a. 29mm Edwards Sapien XT transcatheter heart valve placed via open left transfemoral approach b. Intra-op TEE: well-seated bioprosthetic aortic valve with mean gradient 2 mmHg, trivial AI, mild MR, EF 30-35%  . TRANSCATHETER AORTIC VALVE REPLACEMENT, TRANSFEMORAL N/A 12/21/2012   Procedure: TRANSCATHETER AORTIC VALVE REPLACEMENT, TRANSFEMORAL;  Surgeon: Tonny Bollman, MD;  Location: [facility] OR;  Service: Open Heart Surgery;  Laterality: N/A;    Current Medications: Prior to Admission medications   Medication Sig Start Date End Date Taking? Authorizing Provider  acetaminophen (TYLENOL) 325 MG tablet Take 650 mg by mouth every 4 (four) hours as needed for mild pain.    [provider]  alendronate (FOSAMAX) 70 MG/75ML solution Take 70 mg by mouth every Saturday. Take on an empty stomach with a full glass of fluids, remain upright 30 minutes after administration  - for bone health. Give 75 mL    [provider]  amiodarone (PACERONE) 100 MG tablet Take 100 mg by mouth daily.    [provider]  atorvastatin (LIPITOR) 10 MG tablet Take 10 mg by mouth daily at 6 PM.     [provider]  Biotin 5000 MCG CAPS Take 5,000 mcg by mouth daily at 6 PM.    [provider]  bisacodyl (DULCOLAX) 10 MG suppository Place 10 mg rectally daily as needed (constipation).     [provider]  calcitRIOL (ROCALTROL) 0.25 MCG capsule Take 0.25 mcg by mouth daily.     [provider]  camphor-menthol Wynelle Fanny) lotion  Apply 1 application topically as needed for itching.    [provider]  carvedilol (COREG) 3.125 MG tablet Take 1 tablet (3.125 mg total) by mouth 2 (two) times daily with a meal. 07/17/16   Penny Pia, MD  cholecalciferol (VITAMIN D) 1000 units tablet Take 2,000 Units by mouth at bedtime.    [provider]  cycloSPORINE (RESTASIS) 0.05 % ophthalmic emulsion Place 1 drop into both eyes 2 (two) times daily.     [provider]  donepezil (ARICEPT) 10 MG tablet Take 10 mg by mouth at bedtime.     [provider]  DULoxetine (CYMBALTA) 60 MG capsule Take 60 mg by mouth daily at 6 PM.     [provider]  esomeprazole (NEXIUM) 40 MG capsule Take 40 mg by mouth daily.     [provider]  febuxostat (ULORIC) 40 MG tablet Take 20 mg by mouth daily at 6 PM.     [provider]  ferrous sulfate 325 (65 FE) MG tablet Take 325 mg by mouth daily.     [provider]  hydrALAZINE (APRESOLINE) 10 MG tablet Take 1 tablet (10 mg total) by mouth 3 (three) times daily. 07/17/16   Penny Pia, MD  HYDROcodone-acetaminophen (NORCO/VICODIN) 5-325 MG tablet Take 1 tablet by mouth 2 (two) times daily as needed (Pain - hold for sedation).    [provider]  ipratropium-albuterol (DUONEB) 0.5-2.5 (3) MG/3ML SOLN Take 3 mLs by nebulization every 6 (six)  hours as needed (wheezing/shortness of breath).    [provider]  isosorbide mononitrate (IMDUR) 30 MG 24 hr tablet Take 1 tablet (30 mg total) by mouth daily. 08/05/16 11/04/16  Manson Passey, PA  levothyroxine (SYNTHROID, LEVOTHROID) 112 MCG tablet Take 112 mcg by mouth daily.     [provider]  Linaclotide Karlene Einstein) 145 MCG CAPS capsule Take 145 mcg by mouth daily.     [provider]  memantine (NAMENDA) 5 MG tablet Take 5 mg by mouth 2 (two) times daily.     [provider]  metolazone (ZAROXOLYN) 2.5 MG tablet Take one tablet by mouth 30 minutes prior to morning dosage of Torsemide if dry weight is 5 lb greater than 168 lbs.  No more than twice per week. 10/08/16   Tonny Bollman, MD  Multiple Vitamin (MULTIVITAMIN WITH MINERALS) TABS tablet Take 1 tablet by mouth daily.    [provider]  multivitamin (RENA-VIT) TABS tablet Take 1 tablet by mouth daily.     [provider]  nitroGLYCERIN (NITROSTAT) 0.4 MG SL tablet Place 0.4 mg under the tongue every 5 (five) minutes as needed for chest pain (MAX 3 TABLETS).     [provider]  polyethylene glycol (MIRALAX / GLYCOLAX) packet Take 17 g by mouth 2 (two) times daily. 07/30/16   Rolly Salter, MD  potassium chloride (KLOR-CON) 20 MEQ packet Take 20 mEq by mouth daily.     [provider]  pramoxine (SARNA SENSITIVE) 1 % LOTN Apply 1 application topically as needed (itching).     [provider]  pregabalin (LYRICA) 25 MG capsule Take 25 mg by mouth 2 (two) times daily. 6AM and 6PM    [provider]  senna-docusate (SENOKOT-S) 8.6-50 MG tablet Take 1 tablet by mouth 2 (two) times daily. 07/30/16   Rolly Salter, MD  simethicone (MYLICON) 80 MG chewable tablet Chew 80 mg by mouth every 12 (twelve) hours as needed for flatulence.  [provider]  torsemide (DEMADEX) 20 MG tablet Take 40 mg by mouth daily.     [provider]    vitamin B-12 (CYANOCOBALAMIN) 1000 MCG tablet Take 1,000 mcg by mouth 2 (two) times a week. Tuesday and Friday    [provider]  warfarin (COUMADIN) 2 MG tablet Take 2 mg by mouth daily.    [provider]    Allergies:   Nubain [nalbuphine hcl]; Codeine; and Darvocet [propoxyphene n-acetaminophen]   Social History   Social History  . Marital status: Divorced    Spouse name: N/A  . Number of children: N/A  . Years of education: N/A   Social History Main Topics  . Smoking status: Never Smoker  . Smokeless tobacco: Never Used  . Alcohol use No  . Drug use: No  . Sexual activity: Not Currently   Other Topics Concern  . None   Social History Narrative  . None     Family History:  The patient's family history includes Epilepsy in her father; Ovarian cancer in her mother.   ROS:   Please see the history of present illness.    ROS All other systems reviewed and are negative.   PHYSICAL EXAM:   VS:  BP (!) 142/62   Pulse 73   Ht  (1.448 m)   Wt 165 lb (74.8 kg)   SpO2 94%   BMI 35.71 kg/m    GEN: Well nourished, well developed, in no acute distress  HEENT: normal  Neck: no JVD, carotid bruits, or masses Cardiac: RRR; no murmurs, rubs, or gallops,no edema  Respiratory:  clear to auscultation bilaterally, normal work of breathing GI: soft, nontender, nondistended, + BS MS: no deformity or atrophy  Skin: warm and dry, no rash Neuro:  Alert and Oriented x 3, Strength and sensation are intact Psych: euthymic mood, full affect  Wt Readings from Last 3 Encounters:  01/21/17 165 lb (74.8 kg)  11/03/16 166 lb (75.3 kg)  10/08/16 173 lb (78.5 kg)      Studies/Labs Reviewed:   EKG:  EKG is not ordered today.   Recent Labs: 06/15/2016: TSH 2.673 07/21/2016: B Natriuretic Peptide 1,636.9 07/24/2016: ALT 14 08/05/2016: Hemoglobin 10.5; Platelets 282 08/26/2016: BUN 49; Creatinine 1.9; Potassium 4.7; Sodium 143   Lipid Panel    Component Value  Date/Time   CHOL 153 10/01/2015   TRIG 247 (A) 10/01/2015   HDL 36 10/01/2015   CHOLHDL 1.8 06/16/2010 0415   VLDL 13 06/16/2010 0415   LDLCALC 67 10/01/2015    Additional studies/ records that were reviewed today include:   Echocardiogram: 06/13/16 Study Conclusions  - Left ventricle: LVEF is approximately 35 to 40% with hypokinesis   of inferior, inferoseptal and anterior walls This is new compared   to echo of 2016. The cavity size was moderately dilated. Wall   thickness was normal. Features are consistent with a pseudonormal   left ventricular filling pattern, with concomitant abnormal   relaxation and increased filling pressure (grade 2 diastolic   dysfunction). Doppler parameters are consistent with high   ventricular filling pressure. - Aortic valve: AV prosthesis is difficult to see Peak and mean   gradients through the valve are 12 and 7 mm Hg respectively.   There is trace valvular AI Valve area (VTI): 1.82 cm^2. Valve   area (Vmax): 1.7 cm^2. Valve area (Vmean): 1.89 cm^2. - Mitral valve: There was mild to moderate regurgitation. - Left atrium: The atrium was mildly dilated. -  Pulmonary arteries: PA peak pressure: 39 mm Hg (S).    ASSESSMENT & PLAN:    1. Chronic combined CHF - Euvolemic. Continue current regimen. She requires when necessary metolazone once or twice per month.  2. S/p TVAR - Normal functioning device on last echo 05/2016  3. PAF - Sinus on exam. Continue coumadin for anticoagulation. At bedtime facility. Continue amiodarone. We will check TSH and liver function tests.   4. CKD stage IV - follow by Dr. Allena Katz  5. S/p PPM - follow by Dr. Ladona Ridgel  6. HTN - Stable on current regimen. No changes.  7. HLD - LDL 62 on 11/04/16. Continue statin.     Medication Adjustments/Labs and Tests Ordered: Current medicines are reviewed at length with the patient today.  Concerns regarding medicines are outlined above.  Medication changes, Labs and  Tests ordered today are listed in the Patient Instructions below. Patient Instructions  Medication Instructions: Your physician recommends that you continue on your current medications as directed. Please refer to the Current Medication list given to you today.  Labwork: Your physician recommends that you have lab work at your facility: TSH and Hepatic Panel  Procedures/Testing: None Ordered  Follow-Up: Your physician wants you to follow-up in: 4 MONTHS with Dr. Excell Seltzer. You will receive a reminder letter in the mail two months in advance. If you don't receive a letter, please call our office to schedule the follow-up appointment.    If you need a refill on your cardiac medications before your next appointment, please call your pharmacy.      Lorelei Pont, [place]  01/21/2017 2:37 PM    [facility] [address] [place], [place], [place]  [address] Phone: [phone]; Fax: [phone]

## 2017-01-21 ENCOUNTER — Ambulatory Visit (INDEPENDENT_AMBULATORY_CARE_PROVIDER_SITE_OTHER): Payer: Medicare Other | Admitting: Physician Assistant

## 2017-01-21 ENCOUNTER — Encounter: Payer: Self-pay | Admitting: Physician Assistant

## 2017-01-21 VITALS — BP 142/62 | HR 73 | Ht <= 58 in | Wt 165.0 lb

## 2017-01-21 DIAGNOSIS — I5042 Chronic combined systolic (congestive) and diastolic (congestive) heart failure: Secondary | ICD-10-CM | POA: Diagnosis not present

## 2017-01-21 DIAGNOSIS — N184 Chronic kidney disease, stage 4 (severe): Secondary | ICD-10-CM | POA: Diagnosis not present

## 2017-01-21 DIAGNOSIS — I10 Essential (primary) hypertension: Secondary | ICD-10-CM

## 2017-01-21 DIAGNOSIS — I480 Paroxysmal atrial fibrillation: Secondary | ICD-10-CM | POA: Diagnosis not present

## 2017-01-21 DIAGNOSIS — Z950 Presence of cardiac pacemaker: Secondary | ICD-10-CM

## 2017-01-21 DIAGNOSIS — N189 Chronic kidney disease, unspecified: Secondary | ICD-10-CM | POA: Diagnosis not present

## 2017-01-21 DIAGNOSIS — I359 Nonrheumatic aortic valve disorder, unspecified: Secondary | ICD-10-CM | POA: Diagnosis not present

## 2017-01-21 DIAGNOSIS — Z79899 Other long term (current) drug therapy: Secondary | ICD-10-CM | POA: Diagnosis not present

## 2017-01-21 DIAGNOSIS — I428 Other cardiomyopathies: Secondary | ICD-10-CM

## 2017-01-21 DIAGNOSIS — N2581 Secondary hyperparathyroidism of renal origin: Secondary | ICD-10-CM | POA: Diagnosis not present

## 2017-01-21 NOTE — Addendum Note (Signed)
Addended by: Dareen Piano on: 01/21/2017 02:47 PM   Modules accepted: Orders

## 2017-01-21 NOTE — Patient Instructions (Signed)
Medication Instructions: Your physician recommends that you continue on your current medications as directed. Please refer to the Current Medication list given to you today.  Labwork: Your physician recommends that you have lab work at your facility: TSH and Hepatic Panel  Procedures/Testing: None Ordered  Follow-Up: Your physician wants you to follow-up in: 4 MONTHS with Dr. Excell Seltzer. You will receive a reminder letter in the mail two months in advance. If you don't receive a letter, please call our office to schedule the follow-up appointment.    If you need a refill on your cardiac medications before your next appointment, please call your pharmacy.

## 2017-01-22 LAB — TSH: TSH: 0.278 u[IU]/mL — ABNORMAL LOW (ref 0.450–4.500)

## 2017-01-22 LAB — HEPATIC FUNCTION PANEL
ALK PHOS: 82 IU/L (ref 39–117)
ALT: 12 IU/L (ref 0–32)
AST: 23 IU/L (ref 0–40)
Albumin: 4.2 g/dL (ref 3.5–4.7)
Bilirubin Total: 0.3 mg/dL (ref 0.0–1.2)
Bilirubin, Direct: 0.08 mg/dL (ref 0.00–0.40)
Total Protein: 6.7 g/dL (ref 6.0–8.5)

## 2017-01-28 DIAGNOSIS — N2581 Secondary hyperparathyroidism of renal origin: Secondary | ICD-10-CM | POA: Diagnosis not present

## 2017-01-28 DIAGNOSIS — D631 Anemia in chronic kidney disease: Secondary | ICD-10-CM | POA: Diagnosis not present

## 2017-01-28 DIAGNOSIS — I129 Hypertensive chronic kidney disease with stage 1 through stage 4 chronic kidney disease, or unspecified chronic kidney disease: Secondary | ICD-10-CM | POA: Diagnosis not present

## 2017-01-28 DIAGNOSIS — N184 Chronic kidney disease, stage 4 (severe): Secondary | ICD-10-CM | POA: Diagnosis not present

## 2017-02-02 ENCOUNTER — Encounter: Payer: Medicare Other | Admitting: *Deleted

## 2017-02-02 ENCOUNTER — Telehealth: Payer: Self-pay | Admitting: Cardiology

## 2017-02-02 NOTE — Telephone Encounter (Signed)
LMOVM reminding pt to send remote transmission.   

## 2017-02-06 ENCOUNTER — Encounter: Payer: Self-pay | Admitting: Cardiology

## 2017-02-18 DIAGNOSIS — N189 Chronic kidney disease, unspecified: Secondary | ICD-10-CM | POA: Diagnosis not present

## 2017-02-18 DIAGNOSIS — R252 Cramp and spasm: Secondary | ICD-10-CM | POA: Diagnosis not present

## 2017-02-18 DIAGNOSIS — L989 Disorder of the skin and subcutaneous tissue, unspecified: Secondary | ICD-10-CM | POA: Diagnosis not present

## 2017-02-19 DIAGNOSIS — Z79899 Other long term (current) drug therapy: Secondary | ICD-10-CM | POA: Diagnosis not present

## 2017-03-04 DIAGNOSIS — E039 Hypothyroidism, unspecified: Secondary | ICD-10-CM | POA: Diagnosis not present

## 2017-03-04 DIAGNOSIS — I5043 Acute on chronic combined systolic (congestive) and diastolic (congestive) heart failure: Secondary | ICD-10-CM | POA: Diagnosis not present

## 2017-03-05 DIAGNOSIS — N189 Chronic kidney disease, unspecified: Secondary | ICD-10-CM | POA: Diagnosis not present

## 2017-03-05 DIAGNOSIS — G629 Polyneuropathy, unspecified: Secondary | ICD-10-CM | POA: Diagnosis not present

## 2017-03-05 DIAGNOSIS — I10 Essential (primary) hypertension: Secondary | ICD-10-CM | POA: Diagnosis not present

## 2017-03-05 DIAGNOSIS — I4891 Unspecified atrial fibrillation: Secondary | ICD-10-CM | POA: Diagnosis not present

## 2017-03-20 DIAGNOSIS — K219 Gastro-esophageal reflux disease without esophagitis: Secondary | ICD-10-CM | POA: Diagnosis not present

## 2017-04-01 DIAGNOSIS — M109 Gout, unspecified: Secondary | ICD-10-CM | POA: Diagnosis not present

## 2017-04-03 DIAGNOSIS — I5043 Acute on chronic combined systolic (congestive) and diastolic (congestive) heart failure: Secondary | ICD-10-CM | POA: Diagnosis not present

## 2017-04-06 DIAGNOSIS — M109 Gout, unspecified: Secondary | ICD-10-CM | POA: Diagnosis not present

## 2017-04-06 DIAGNOSIS — L309 Dermatitis, unspecified: Secondary | ICD-10-CM | POA: Diagnosis not present

## 2017-04-06 DIAGNOSIS — I5043 Acute on chronic combined systolic (congestive) and diastolic (congestive) heart failure: Secondary | ICD-10-CM | POA: Diagnosis not present

## 2017-04-07 DIAGNOSIS — I5043 Acute on chronic combined systolic (congestive) and diastolic (congestive) heart failure: Secondary | ICD-10-CM | POA: Diagnosis not present

## 2017-04-08 DIAGNOSIS — I5043 Acute on chronic combined systolic (congestive) and diastolic (congestive) heart failure: Secondary | ICD-10-CM | POA: Diagnosis not present

## 2017-04-09 DIAGNOSIS — I5043 Acute on chronic combined systolic (congestive) and diastolic (congestive) heart failure: Secondary | ICD-10-CM | POA: Diagnosis not present

## 2017-04-10 DIAGNOSIS — I5043 Acute on chronic combined systolic (congestive) and diastolic (congestive) heart failure: Secondary | ICD-10-CM | POA: Diagnosis not present

## 2017-04-11 DIAGNOSIS — I5043 Acute on chronic combined systolic (congestive) and diastolic (congestive) heart failure: Secondary | ICD-10-CM | POA: Diagnosis not present

## 2017-04-12 DIAGNOSIS — I5043 Acute on chronic combined systolic (congestive) and diastolic (congestive) heart failure: Secondary | ICD-10-CM | POA: Diagnosis not present

## 2017-04-15 DIAGNOSIS — I5043 Acute on chronic combined systolic (congestive) and diastolic (congestive) heart failure: Secondary | ICD-10-CM | POA: Diagnosis not present

## 2017-04-16 DIAGNOSIS — I5043 Acute on chronic combined systolic (congestive) and diastolic (congestive) heart failure: Secondary | ICD-10-CM | POA: Diagnosis not present

## 2017-04-18 DIAGNOSIS — I5043 Acute on chronic combined systolic (congestive) and diastolic (congestive) heart failure: Secondary | ICD-10-CM | POA: Diagnosis not present

## 2017-04-20 DIAGNOSIS — I5043 Acute on chronic combined systolic (congestive) and diastolic (congestive) heart failure: Secondary | ICD-10-CM | POA: Diagnosis not present

## 2017-04-21 DIAGNOSIS — I5043 Acute on chronic combined systolic (congestive) and diastolic (congestive) heart failure: Secondary | ICD-10-CM | POA: Diagnosis not present

## 2017-04-22 DIAGNOSIS — I5043 Acute on chronic combined systolic (congestive) and diastolic (congestive) heart failure: Secondary | ICD-10-CM | POA: Diagnosis not present

## 2017-04-23 DIAGNOSIS — I5043 Acute on chronic combined systolic (congestive) and diastolic (congestive) heart failure: Secondary | ICD-10-CM | POA: Diagnosis not present

## 2017-04-24 DIAGNOSIS — I5043 Acute on chronic combined systolic (congestive) and diastolic (congestive) heart failure: Secondary | ICD-10-CM | POA: Diagnosis not present

## 2017-04-27 DIAGNOSIS — I5043 Acute on chronic combined systolic (congestive) and diastolic (congestive) heart failure: Secondary | ICD-10-CM | POA: Diagnosis not present

## 2017-04-28 DIAGNOSIS — I5043 Acute on chronic combined systolic (congestive) and diastolic (congestive) heart failure: Secondary | ICD-10-CM | POA: Diagnosis not present

## 2017-04-29 DIAGNOSIS — N184 Chronic kidney disease, stage 4 (severe): Secondary | ICD-10-CM | POA: Diagnosis not present

## 2017-04-29 DIAGNOSIS — N189 Chronic kidney disease, unspecified: Secondary | ICD-10-CM | POA: Diagnosis not present

## 2017-04-29 DIAGNOSIS — I5043 Acute on chronic combined systolic (congestive) and diastolic (congestive) heart failure: Secondary | ICD-10-CM | POA: Diagnosis not present

## 2017-04-29 DIAGNOSIS — N2581 Secondary hyperparathyroidism of renal origin: Secondary | ICD-10-CM | POA: Diagnosis not present

## 2017-04-30 DIAGNOSIS — I5043 Acute on chronic combined systolic (congestive) and diastolic (congestive) heart failure: Secondary | ICD-10-CM | POA: Diagnosis not present

## 2017-05-01 DIAGNOSIS — I5043 Acute on chronic combined systolic (congestive) and diastolic (congestive) heart failure: Secondary | ICD-10-CM | POA: Diagnosis not present

## 2017-05-04 DIAGNOSIS — I5043 Acute on chronic combined systolic (congestive) and diastolic (congestive) heart failure: Secondary | ICD-10-CM | POA: Diagnosis not present

## 2017-05-05 DIAGNOSIS — I5043 Acute on chronic combined systolic (congestive) and diastolic (congestive) heart failure: Secondary | ICD-10-CM | POA: Diagnosis not present

## 2017-05-06 DIAGNOSIS — I129 Hypertensive chronic kidney disease with stage 1 through stage 4 chronic kidney disease, or unspecified chronic kidney disease: Secondary | ICD-10-CM | POA: Diagnosis not present

## 2017-05-06 DIAGNOSIS — D631 Anemia in chronic kidney disease: Secondary | ICD-10-CM | POA: Diagnosis not present

## 2017-05-06 DIAGNOSIS — N2581 Secondary hyperparathyroidism of renal origin: Secondary | ICD-10-CM | POA: Diagnosis not present

## 2017-05-06 DIAGNOSIS — N184 Chronic kidney disease, stage 4 (severe): Secondary | ICD-10-CM | POA: Diagnosis not present

## 2017-05-07 DIAGNOSIS — I5043 Acute on chronic combined systolic (congestive) and diastolic (congestive) heart failure: Secondary | ICD-10-CM | POA: Diagnosis not present

## 2017-05-08 DIAGNOSIS — D649 Anemia, unspecified: Secondary | ICD-10-CM | POA: Diagnosis not present

## 2017-05-08 DIAGNOSIS — I509 Heart failure, unspecified: Secondary | ICD-10-CM | POA: Diagnosis not present

## 2017-05-08 DIAGNOSIS — Z952 Presence of prosthetic heart valve: Secondary | ICD-10-CM | POA: Diagnosis not present

## 2017-05-08 DIAGNOSIS — I5043 Acute on chronic combined systolic (congestive) and diastolic (congestive) heart failure: Secondary | ICD-10-CM | POA: Diagnosis not present

## 2017-05-08 DIAGNOSIS — N189 Chronic kidney disease, unspecified: Secondary | ICD-10-CM | POA: Diagnosis not present

## 2017-05-11 DIAGNOSIS — I5043 Acute on chronic combined systolic (congestive) and diastolic (congestive) heart failure: Secondary | ICD-10-CM | POA: Diagnosis not present

## 2017-05-11 DIAGNOSIS — Z79899 Other long term (current) drug therapy: Secondary | ICD-10-CM | POA: Diagnosis not present

## 2017-05-12 DIAGNOSIS — I5043 Acute on chronic combined systolic (congestive) and diastolic (congestive) heart failure: Secondary | ICD-10-CM | POA: Diagnosis not present

## 2017-05-13 DIAGNOSIS — I5043 Acute on chronic combined systolic (congestive) and diastolic (congestive) heart failure: Secondary | ICD-10-CM | POA: Diagnosis not present

## 2017-05-14 DIAGNOSIS — I5043 Acute on chronic combined systolic (congestive) and diastolic (congestive) heart failure: Secondary | ICD-10-CM | POA: Diagnosis not present

## 2017-05-15 DIAGNOSIS — I5043 Acute on chronic combined systolic (congestive) and diastolic (congestive) heart failure: Secondary | ICD-10-CM | POA: Diagnosis not present

## 2017-05-18 DIAGNOSIS — I5043 Acute on chronic combined systolic (congestive) and diastolic (congestive) heart failure: Secondary | ICD-10-CM | POA: Diagnosis not present

## 2017-05-19 DIAGNOSIS — I5043 Acute on chronic combined systolic (congestive) and diastolic (congestive) heart failure: Secondary | ICD-10-CM | POA: Diagnosis not present

## 2017-05-19 DIAGNOSIS — G629 Polyneuropathy, unspecified: Secondary | ICD-10-CM | POA: Diagnosis not present

## 2017-05-20 DIAGNOSIS — I5043 Acute on chronic combined systolic (congestive) and diastolic (congestive) heart failure: Secondary | ICD-10-CM | POA: Diagnosis not present

## 2017-05-21 DIAGNOSIS — I5043 Acute on chronic combined systolic (congestive) and diastolic (congestive) heart failure: Secondary | ICD-10-CM | POA: Diagnosis not present

## 2017-05-22 DIAGNOSIS — I5043 Acute on chronic combined systolic (congestive) and diastolic (congestive) heart failure: Secondary | ICD-10-CM | POA: Diagnosis not present

## 2017-05-22 DIAGNOSIS — Z79899 Other long term (current) drug therapy: Secondary | ICD-10-CM | POA: Diagnosis not present

## 2017-05-25 DIAGNOSIS — I5043 Acute on chronic combined systolic (congestive) and diastolic (congestive) heart failure: Secondary | ICD-10-CM | POA: Diagnosis not present

## 2017-05-25 DIAGNOSIS — G629 Polyneuropathy, unspecified: Secondary | ICD-10-CM | POA: Diagnosis not present

## 2017-05-26 DIAGNOSIS — G629 Polyneuropathy, unspecified: Secondary | ICD-10-CM | POA: Diagnosis not present

## 2017-05-26 DIAGNOSIS — I5043 Acute on chronic combined systolic (congestive) and diastolic (congestive) heart failure: Secondary | ICD-10-CM | POA: Diagnosis not present

## 2017-05-27 DIAGNOSIS — I5043 Acute on chronic combined systolic (congestive) and diastolic (congestive) heart failure: Secondary | ICD-10-CM | POA: Diagnosis not present

## 2017-05-28 DIAGNOSIS — I5043 Acute on chronic combined systolic (congestive) and diastolic (congestive) heart failure: Secondary | ICD-10-CM | POA: Diagnosis not present

## 2017-05-29 DIAGNOSIS — M79604 Pain in right leg: Secondary | ICD-10-CM | POA: Diagnosis not present

## 2017-05-29 DIAGNOSIS — I5043 Acute on chronic combined systolic (congestive) and diastolic (congestive) heart failure: Secondary | ICD-10-CM | POA: Diagnosis not present

## 2017-05-29 DIAGNOSIS — R400 Somnolence: Secondary | ICD-10-CM | POA: Diagnosis not present

## 2017-05-29 DIAGNOSIS — M79605 Pain in left leg: Secondary | ICD-10-CM | POA: Diagnosis not present

## 2017-05-30 DIAGNOSIS — I5043 Acute on chronic combined systolic (congestive) and diastolic (congestive) heart failure: Secondary | ICD-10-CM | POA: Diagnosis not present

## 2017-06-01 DIAGNOSIS — I5043 Acute on chronic combined systolic (congestive) and diastolic (congestive) heart failure: Secondary | ICD-10-CM | POA: Diagnosis not present

## 2017-06-02 DIAGNOSIS — I5043 Acute on chronic combined systolic (congestive) and diastolic (congestive) heart failure: Secondary | ICD-10-CM | POA: Diagnosis not present

## 2017-06-02 DIAGNOSIS — Z79899 Other long term (current) drug therapy: Secondary | ICD-10-CM | POA: Diagnosis not present

## 2017-06-03 DIAGNOSIS — I5043 Acute on chronic combined systolic (congestive) and diastolic (congestive) heart failure: Secondary | ICD-10-CM | POA: Diagnosis not present

## 2017-06-05 DIAGNOSIS — K5900 Constipation, unspecified: Secondary | ICD-10-CM | POA: Diagnosis not present

## 2017-06-05 DIAGNOSIS — J069 Acute upper respiratory infection, unspecified: Secondary | ICD-10-CM | POA: Diagnosis not present

## 2017-06-05 DIAGNOSIS — G629 Polyneuropathy, unspecified: Secondary | ICD-10-CM | POA: Diagnosis not present

## 2017-06-09 DIAGNOSIS — G629 Polyneuropathy, unspecified: Secondary | ICD-10-CM | POA: Diagnosis not present

## 2017-06-09 DIAGNOSIS — K5900 Constipation, unspecified: Secondary | ICD-10-CM | POA: Diagnosis not present

## 2017-06-11 DIAGNOSIS — G629 Polyneuropathy, unspecified: Secondary | ICD-10-CM | POA: Diagnosis not present

## 2017-06-15 DIAGNOSIS — M549 Dorsalgia, unspecified: Secondary | ICD-10-CM | POA: Diagnosis not present

## 2017-06-15 DIAGNOSIS — K5900 Constipation, unspecified: Secondary | ICD-10-CM | POA: Diagnosis not present

## 2017-06-17 DIAGNOSIS — G629 Polyneuropathy, unspecified: Secondary | ICD-10-CM | POA: Diagnosis not present

## 2017-06-17 DIAGNOSIS — M549 Dorsalgia, unspecified: Secondary | ICD-10-CM | POA: Diagnosis not present

## 2017-06-17 DIAGNOSIS — K5900 Constipation, unspecified: Secondary | ICD-10-CM | POA: Diagnosis not present

## 2017-06-19 DIAGNOSIS — K5900 Constipation, unspecified: Secondary | ICD-10-CM | POA: Diagnosis not present

## 2017-06-22 DIAGNOSIS — K5900 Constipation, unspecified: Secondary | ICD-10-CM | POA: Diagnosis not present

## 2017-06-22 DIAGNOSIS — G629 Polyneuropathy, unspecified: Secondary | ICD-10-CM | POA: Diagnosis not present

## 2017-07-02 DIAGNOSIS — R0981 Nasal congestion: Secondary | ICD-10-CM | POA: Diagnosis not present

## 2017-07-02 DIAGNOSIS — H6120 Impacted cerumen, unspecified ear: Secondary | ICD-10-CM | POA: Diagnosis not present

## 2017-07-02 DIAGNOSIS — K5900 Constipation, unspecified: Secondary | ICD-10-CM | POA: Diagnosis not present

## 2017-07-14 DIAGNOSIS — M6281 Muscle weakness (generalized): Secondary | ICD-10-CM | POA: Diagnosis not present

## 2017-07-14 DIAGNOSIS — G8929 Other chronic pain: Secondary | ICD-10-CM | POA: Diagnosis not present

## 2017-07-14 DIAGNOSIS — R2689 Other abnormalities of gait and mobility: Secondary | ICD-10-CM | POA: Diagnosis not present

## 2017-07-15 DIAGNOSIS — R2689 Other abnormalities of gait and mobility: Secondary | ICD-10-CM | POA: Diagnosis not present

## 2017-07-15 DIAGNOSIS — G8929 Other chronic pain: Secondary | ICD-10-CM | POA: Diagnosis not present

## 2017-07-15 DIAGNOSIS — M6281 Muscle weakness (generalized): Secondary | ICD-10-CM | POA: Diagnosis not present

## 2017-07-16 DIAGNOSIS — M6281 Muscle weakness (generalized): Secondary | ICD-10-CM | POA: Diagnosis not present

## 2017-07-16 DIAGNOSIS — R2689 Other abnormalities of gait and mobility: Secondary | ICD-10-CM | POA: Diagnosis not present

## 2017-07-16 DIAGNOSIS — G8929 Other chronic pain: Secondary | ICD-10-CM | POA: Diagnosis not present

## 2017-07-17 DIAGNOSIS — R2689 Other abnormalities of gait and mobility: Secondary | ICD-10-CM | POA: Diagnosis not present

## 2017-07-17 DIAGNOSIS — M6281 Muscle weakness (generalized): Secondary | ICD-10-CM | POA: Diagnosis not present

## 2017-07-17 DIAGNOSIS — G8929 Other chronic pain: Secondary | ICD-10-CM | POA: Diagnosis not present

## 2017-07-20 DIAGNOSIS — M6281 Muscle weakness (generalized): Secondary | ICD-10-CM | POA: Diagnosis not present

## 2017-07-20 DIAGNOSIS — G8929 Other chronic pain: Secondary | ICD-10-CM | POA: Diagnosis not present

## 2017-07-20 DIAGNOSIS — R2689 Other abnormalities of gait and mobility: Secondary | ICD-10-CM | POA: Diagnosis not present

## 2017-07-21 DIAGNOSIS — M6281 Muscle weakness (generalized): Secondary | ICD-10-CM | POA: Diagnosis not present

## 2017-07-21 DIAGNOSIS — R2689 Other abnormalities of gait and mobility: Secondary | ICD-10-CM | POA: Diagnosis not present

## 2017-07-21 DIAGNOSIS — G8929 Other chronic pain: Secondary | ICD-10-CM | POA: Diagnosis not present

## 2017-07-22 DIAGNOSIS — G8929 Other chronic pain: Secondary | ICD-10-CM | POA: Diagnosis not present

## 2017-07-22 DIAGNOSIS — R2689 Other abnormalities of gait and mobility: Secondary | ICD-10-CM | POA: Diagnosis not present

## 2017-07-22 DIAGNOSIS — M6281 Muscle weakness (generalized): Secondary | ICD-10-CM | POA: Diagnosis not present

## 2017-07-23 DIAGNOSIS — G8929 Other chronic pain: Secondary | ICD-10-CM | POA: Diagnosis not present

## 2017-07-23 DIAGNOSIS — M6281 Muscle weakness (generalized): Secondary | ICD-10-CM | POA: Diagnosis not present

## 2017-07-23 DIAGNOSIS — R2689 Other abnormalities of gait and mobility: Secondary | ICD-10-CM | POA: Diagnosis not present

## 2017-07-24 DIAGNOSIS — R2689 Other abnormalities of gait and mobility: Secondary | ICD-10-CM | POA: Diagnosis not present

## 2017-07-24 DIAGNOSIS — M6281 Muscle weakness (generalized): Secondary | ICD-10-CM | POA: Diagnosis not present

## 2017-07-24 DIAGNOSIS — G8929 Other chronic pain: Secondary | ICD-10-CM | POA: Diagnosis not present

## 2017-07-27 DIAGNOSIS — G8929 Other chronic pain: Secondary | ICD-10-CM | POA: Diagnosis not present

## 2017-07-27 DIAGNOSIS — M6281 Muscle weakness (generalized): Secondary | ICD-10-CM | POA: Diagnosis not present

## 2017-07-27 DIAGNOSIS — R2689 Other abnormalities of gait and mobility: Secondary | ICD-10-CM | POA: Diagnosis not present

## 2017-07-28 DIAGNOSIS — R2689 Other abnormalities of gait and mobility: Secondary | ICD-10-CM | POA: Diagnosis not present

## 2017-07-28 DIAGNOSIS — G8929 Other chronic pain: Secondary | ICD-10-CM | POA: Diagnosis not present

## 2017-07-28 DIAGNOSIS — M6281 Muscle weakness (generalized): Secondary | ICD-10-CM | POA: Diagnosis not present

## 2017-07-29 DIAGNOSIS — M6281 Muscle weakness (generalized): Secondary | ICD-10-CM | POA: Diagnosis not present

## 2017-07-29 DIAGNOSIS — G8929 Other chronic pain: Secondary | ICD-10-CM | POA: Diagnosis not present

## 2017-07-29 DIAGNOSIS — R2689 Other abnormalities of gait and mobility: Secondary | ICD-10-CM | POA: Diagnosis not present

## 2017-07-30 DIAGNOSIS — M6281 Muscle weakness (generalized): Secondary | ICD-10-CM | POA: Diagnosis not present

## 2017-07-30 DIAGNOSIS — G8929 Other chronic pain: Secondary | ICD-10-CM | POA: Diagnosis not present

## 2017-07-30 DIAGNOSIS — R2689 Other abnormalities of gait and mobility: Secondary | ICD-10-CM | POA: Diagnosis not present

## 2017-08-01 DIAGNOSIS — R2689 Other abnormalities of gait and mobility: Secondary | ICD-10-CM | POA: Diagnosis not present

## 2017-08-01 DIAGNOSIS — G8929 Other chronic pain: Secondary | ICD-10-CM | POA: Diagnosis not present

## 2017-08-01 DIAGNOSIS — M6281 Muscle weakness (generalized): Secondary | ICD-10-CM | POA: Diagnosis not present

## 2017-08-03 DIAGNOSIS — R2689 Other abnormalities of gait and mobility: Secondary | ICD-10-CM | POA: Diagnosis not present

## 2017-08-03 DIAGNOSIS — G8929 Other chronic pain: Secondary | ICD-10-CM | POA: Diagnosis not present

## 2017-08-03 DIAGNOSIS — M6281 Muscle weakness (generalized): Secondary | ICD-10-CM | POA: Diagnosis not present

## 2017-08-04 DIAGNOSIS — G8929 Other chronic pain: Secondary | ICD-10-CM | POA: Diagnosis not present

## 2017-08-04 DIAGNOSIS — M6281 Muscle weakness (generalized): Secondary | ICD-10-CM | POA: Diagnosis not present

## 2017-08-04 DIAGNOSIS — R2689 Other abnormalities of gait and mobility: Secondary | ICD-10-CM | POA: Diagnosis not present

## 2017-08-05 DIAGNOSIS — M6281 Muscle weakness (generalized): Secondary | ICD-10-CM | POA: Diagnosis not present

## 2017-08-05 DIAGNOSIS — R2689 Other abnormalities of gait and mobility: Secondary | ICD-10-CM | POA: Diagnosis not present

## 2017-08-05 DIAGNOSIS — G8929 Other chronic pain: Secondary | ICD-10-CM | POA: Diagnosis not present

## 2017-08-06 DIAGNOSIS — G8929 Other chronic pain: Secondary | ICD-10-CM | POA: Diagnosis not present

## 2017-08-06 DIAGNOSIS — R2689 Other abnormalities of gait and mobility: Secondary | ICD-10-CM | POA: Diagnosis not present

## 2017-08-06 DIAGNOSIS — M6281 Muscle weakness (generalized): Secondary | ICD-10-CM | POA: Diagnosis not present

## 2017-08-07 DIAGNOSIS — R2689 Other abnormalities of gait and mobility: Secondary | ICD-10-CM | POA: Diagnosis not present

## 2017-08-07 DIAGNOSIS — M6281 Muscle weakness (generalized): Secondary | ICD-10-CM | POA: Diagnosis not present

## 2017-08-07 DIAGNOSIS — G8929 Other chronic pain: Secondary | ICD-10-CM | POA: Diagnosis not present

## 2017-08-10 DIAGNOSIS — G629 Polyneuropathy, unspecified: Secondary | ICD-10-CM | POA: Diagnosis not present

## 2017-08-10 DIAGNOSIS — K5900 Constipation, unspecified: Secondary | ICD-10-CM | POA: Diagnosis not present

## 2017-08-10 DIAGNOSIS — R05 Cough: Secondary | ICD-10-CM | POA: Diagnosis not present

## 2017-08-10 DIAGNOSIS — R0602 Shortness of breath: Secondary | ICD-10-CM | POA: Diagnosis not present

## 2017-08-10 DIAGNOSIS — G8929 Other chronic pain: Secondary | ICD-10-CM | POA: Diagnosis not present

## 2017-08-10 DIAGNOSIS — M6281 Muscle weakness (generalized): Secondary | ICD-10-CM | POA: Diagnosis not present

## 2017-08-10 DIAGNOSIS — H6120 Impacted cerumen, unspecified ear: Secondary | ICD-10-CM | POA: Diagnosis not present

## 2017-08-10 DIAGNOSIS — R2689 Other abnormalities of gait and mobility: Secondary | ICD-10-CM | POA: Diagnosis not present

## 2017-08-11 DIAGNOSIS — G8929 Other chronic pain: Secondary | ICD-10-CM | POA: Diagnosis not present

## 2017-08-11 DIAGNOSIS — M6281 Muscle weakness (generalized): Secondary | ICD-10-CM | POA: Diagnosis not present

## 2017-08-11 DIAGNOSIS — N3090 Cystitis, unspecified without hematuria: Secondary | ICD-10-CM | POA: Diagnosis not present

## 2017-08-11 DIAGNOSIS — R2689 Other abnormalities of gait and mobility: Secondary | ICD-10-CM | POA: Diagnosis not present

## 2017-08-12 DIAGNOSIS — G8929 Other chronic pain: Secondary | ICD-10-CM | POA: Diagnosis not present

## 2017-08-12 DIAGNOSIS — M6281 Muscle weakness (generalized): Secondary | ICD-10-CM | POA: Diagnosis not present

## 2017-08-12 DIAGNOSIS — R2689 Other abnormalities of gait and mobility: Secondary | ICD-10-CM | POA: Diagnosis not present

## 2017-08-12 DIAGNOSIS — N390 Urinary tract infection, site not specified: Secondary | ICD-10-CM | POA: Diagnosis not present

## 2017-08-13 DIAGNOSIS — Z79899 Other long term (current) drug therapy: Secondary | ICD-10-CM | POA: Diagnosis not present

## 2017-08-13 DIAGNOSIS — R2689 Other abnormalities of gait and mobility: Secondary | ICD-10-CM | POA: Diagnosis not present

## 2017-08-13 DIAGNOSIS — M6281 Muscle weakness (generalized): Secondary | ICD-10-CM | POA: Diagnosis not present

## 2017-08-13 DIAGNOSIS — G8929 Other chronic pain: Secondary | ICD-10-CM | POA: Diagnosis not present

## 2017-08-13 DIAGNOSIS — N3090 Cystitis, unspecified without hematuria: Secondary | ICD-10-CM | POA: Diagnosis not present

## 2017-08-14 DIAGNOSIS — M6281 Muscle weakness (generalized): Secondary | ICD-10-CM | POA: Diagnosis not present

## 2017-08-14 DIAGNOSIS — G8929 Other chronic pain: Secondary | ICD-10-CM | POA: Diagnosis not present

## 2017-08-14 DIAGNOSIS — R2689 Other abnormalities of gait and mobility: Secondary | ICD-10-CM | POA: Diagnosis not present

## 2017-08-14 DIAGNOSIS — N3000 Acute cystitis without hematuria: Secondary | ICD-10-CM | POA: Diagnosis not present

## 2017-08-15 DIAGNOSIS — R2689 Other abnormalities of gait and mobility: Secondary | ICD-10-CM | POA: Diagnosis not present

## 2017-08-15 DIAGNOSIS — G8929 Other chronic pain: Secondary | ICD-10-CM | POA: Diagnosis not present

## 2017-08-15 DIAGNOSIS — M6281 Muscle weakness (generalized): Secondary | ICD-10-CM | POA: Diagnosis not present

## 2017-08-17 DIAGNOSIS — D631 Anemia in chronic kidney disease: Secondary | ICD-10-CM | POA: Diagnosis not present

## 2017-08-17 DIAGNOSIS — I129 Hypertensive chronic kidney disease with stage 1 through stage 4 chronic kidney disease, or unspecified chronic kidney disease: Secondary | ICD-10-CM | POA: Diagnosis not present

## 2017-08-17 DIAGNOSIS — N2581 Secondary hyperparathyroidism of renal origin: Secondary | ICD-10-CM | POA: Diagnosis not present

## 2017-08-17 DIAGNOSIS — N184 Chronic kidney disease, stage 4 (severe): Secondary | ICD-10-CM | POA: Diagnosis not present

## 2017-09-14 IMAGING — CR DG WRIST COMPLETE 3+V*L*
4 series · 4 of 4 positions shown · non-contrast
Comparison: None.

CLINICAL DATA: Had unwitnessed fall today. Not alert enough to
localize her pain but specified some anterior wrist pain on left
wrist and some lateral wrist pain on right wrist.

EXAM:
LEFT WRIST - COMPLETE 3+ VIEW

[wrist pa]
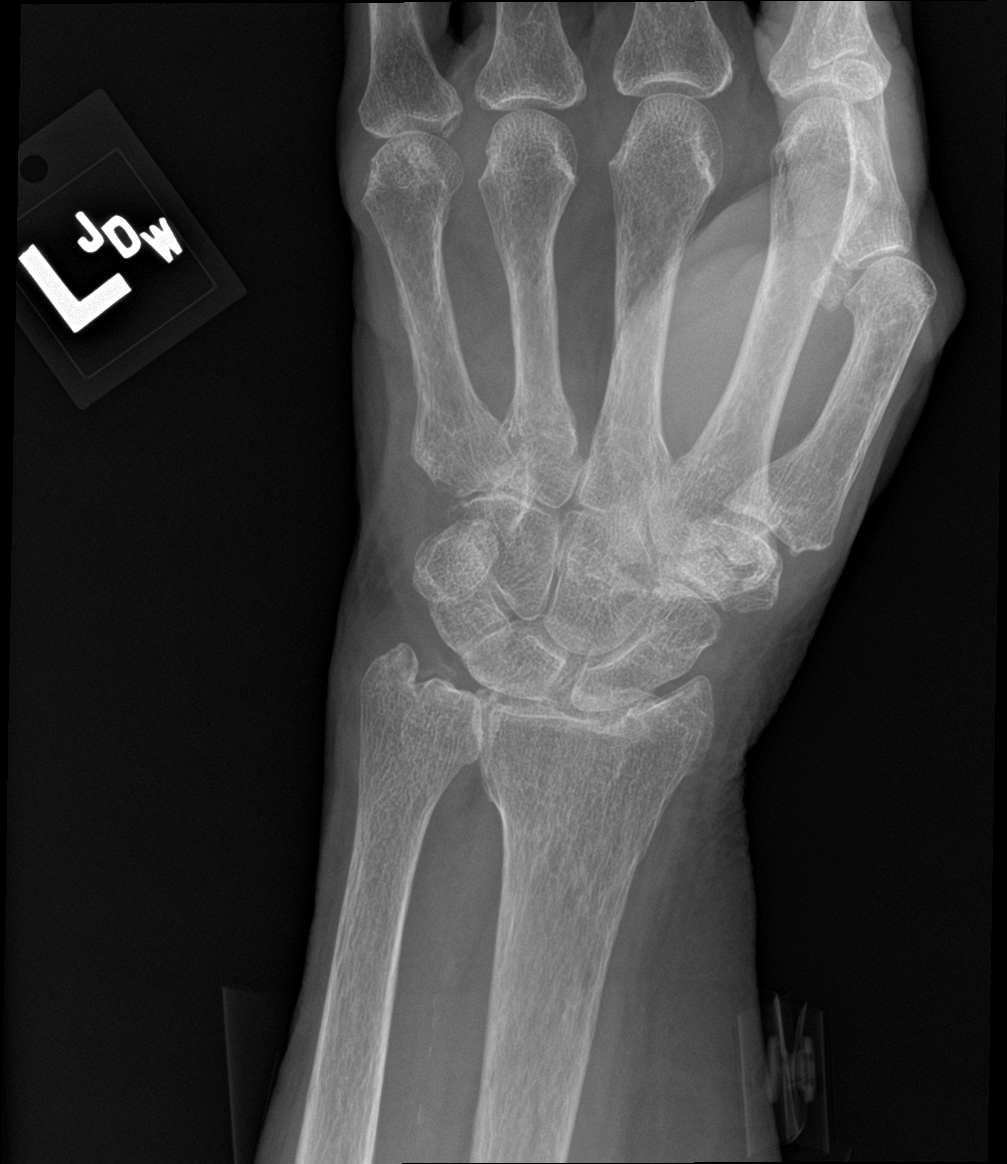

[wrist obl]
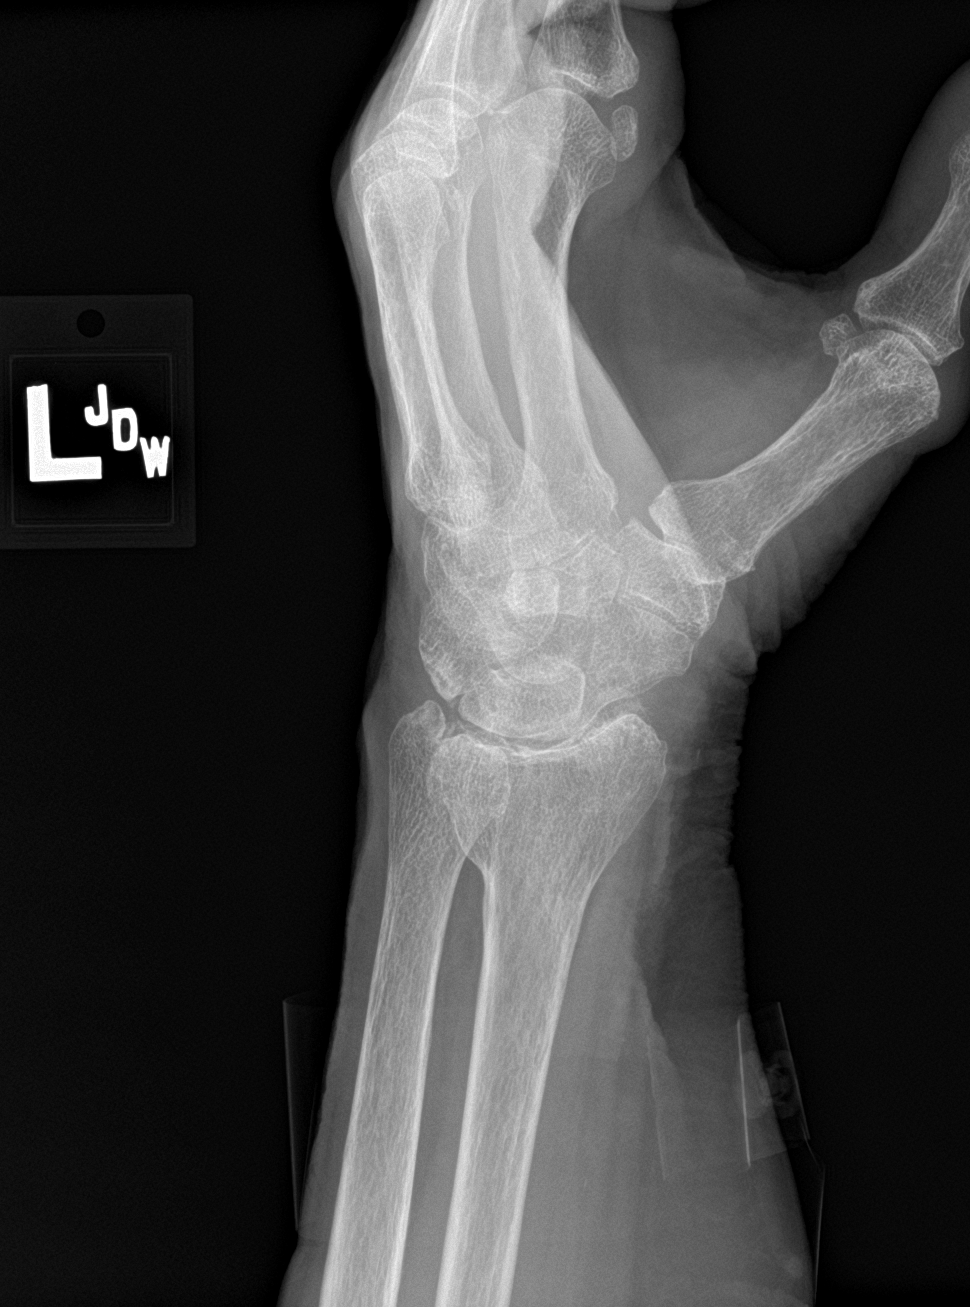

[wrist lat]
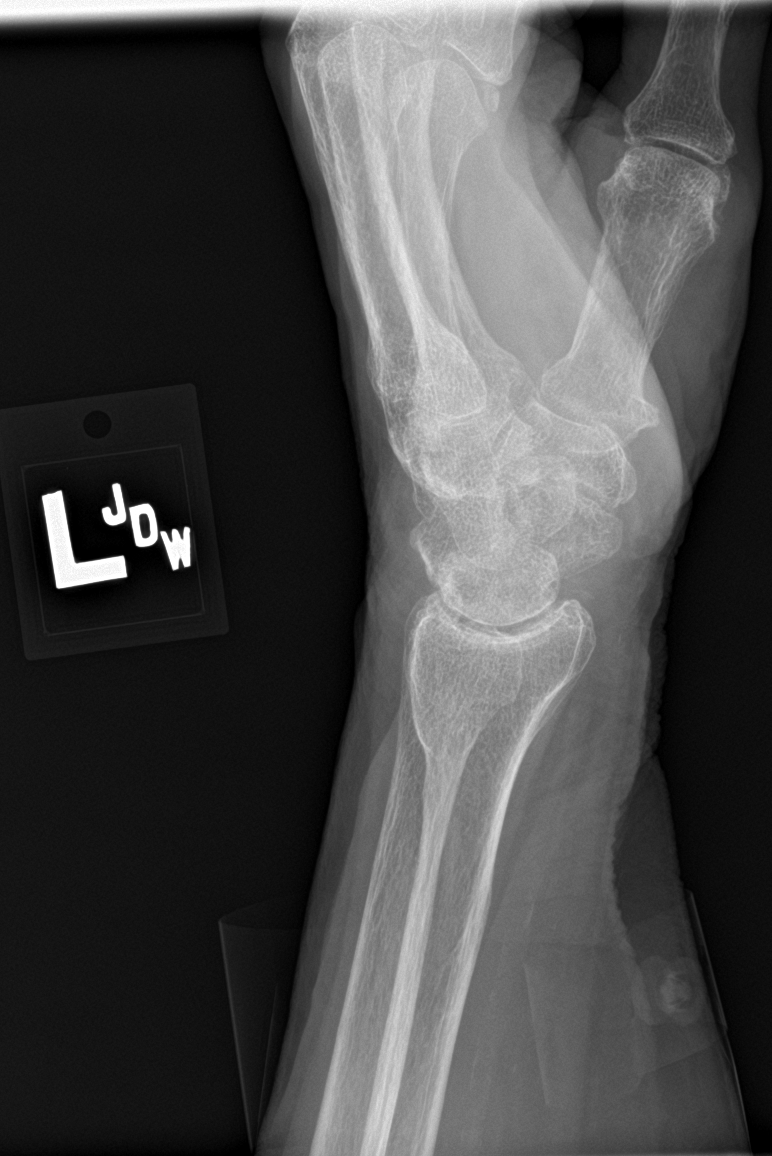

[wrist navicular]
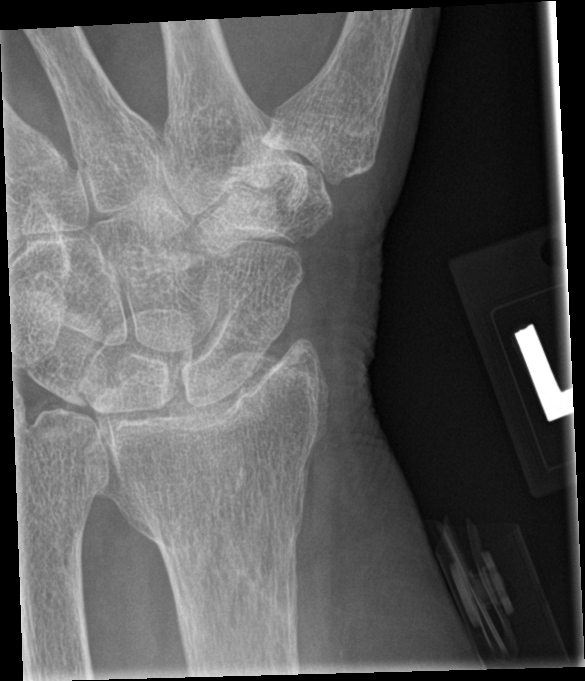

[4 of 4 positions shown; findings below may reference images not displayed]

FINDINGS: Generalized osteopenia. No acute fracture or dislocation. Mild
radiocarpal osteoarthritis. Chondrocalcinosis of the TFCC as can be
seen with CPPD. Mild osteoarthritis of the first CMC joint and first
MCP joint.
IMPRESSION: No acute osseous injury of the left wrist.

## 2017-09-14 IMAGING — CR DG WRIST COMPLETE 3+V*R*
4 series · 4 of 4 positions shown · non-contrast
Comparison: None.

CLINICAL DATA: Status post fall, right wrist pain

EXAM:
RIGHT WRIST - COMPLETE 3+ VIEW

[wrist pa]
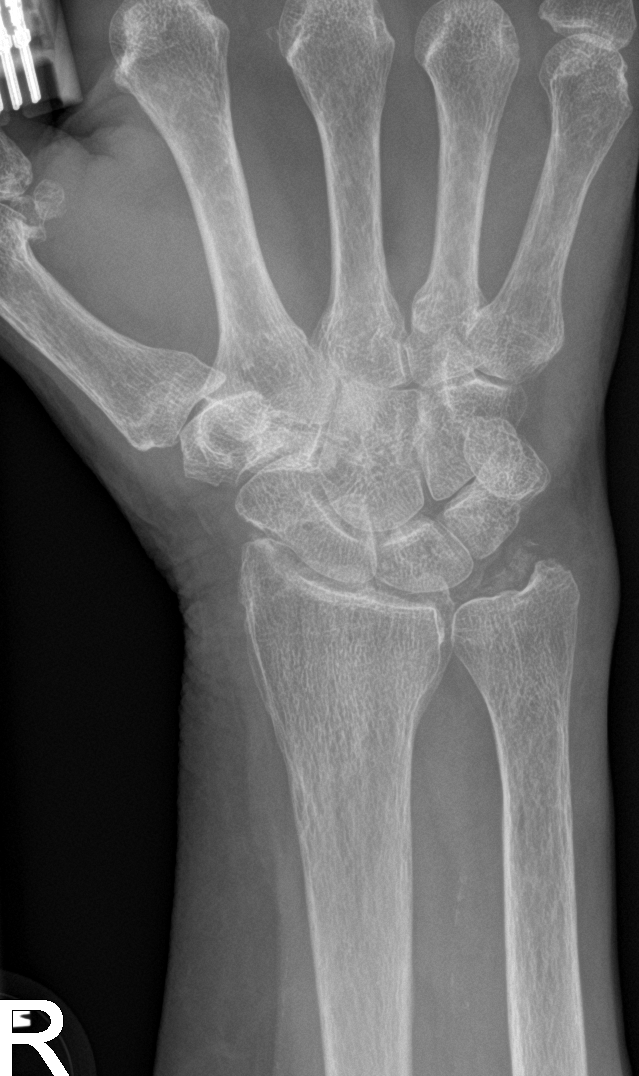

[wrist obl]
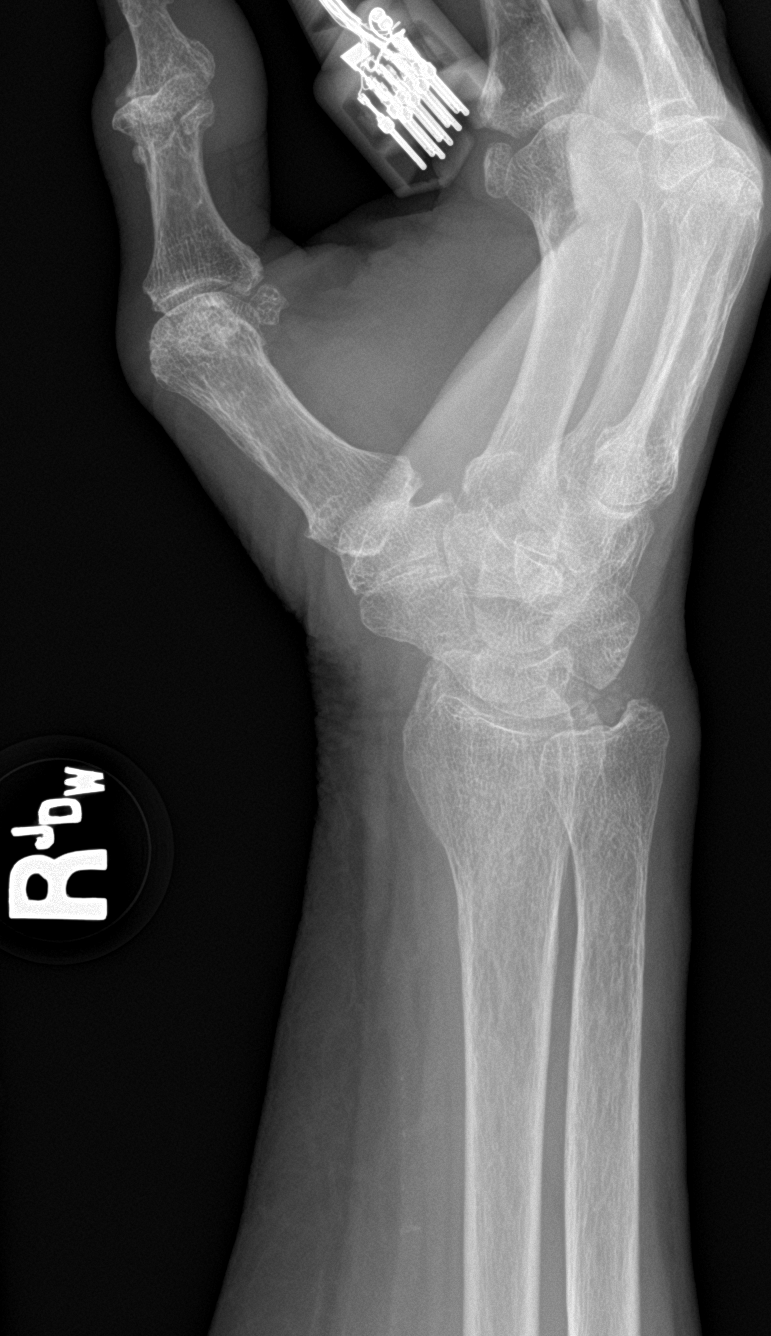

[wrist lat]
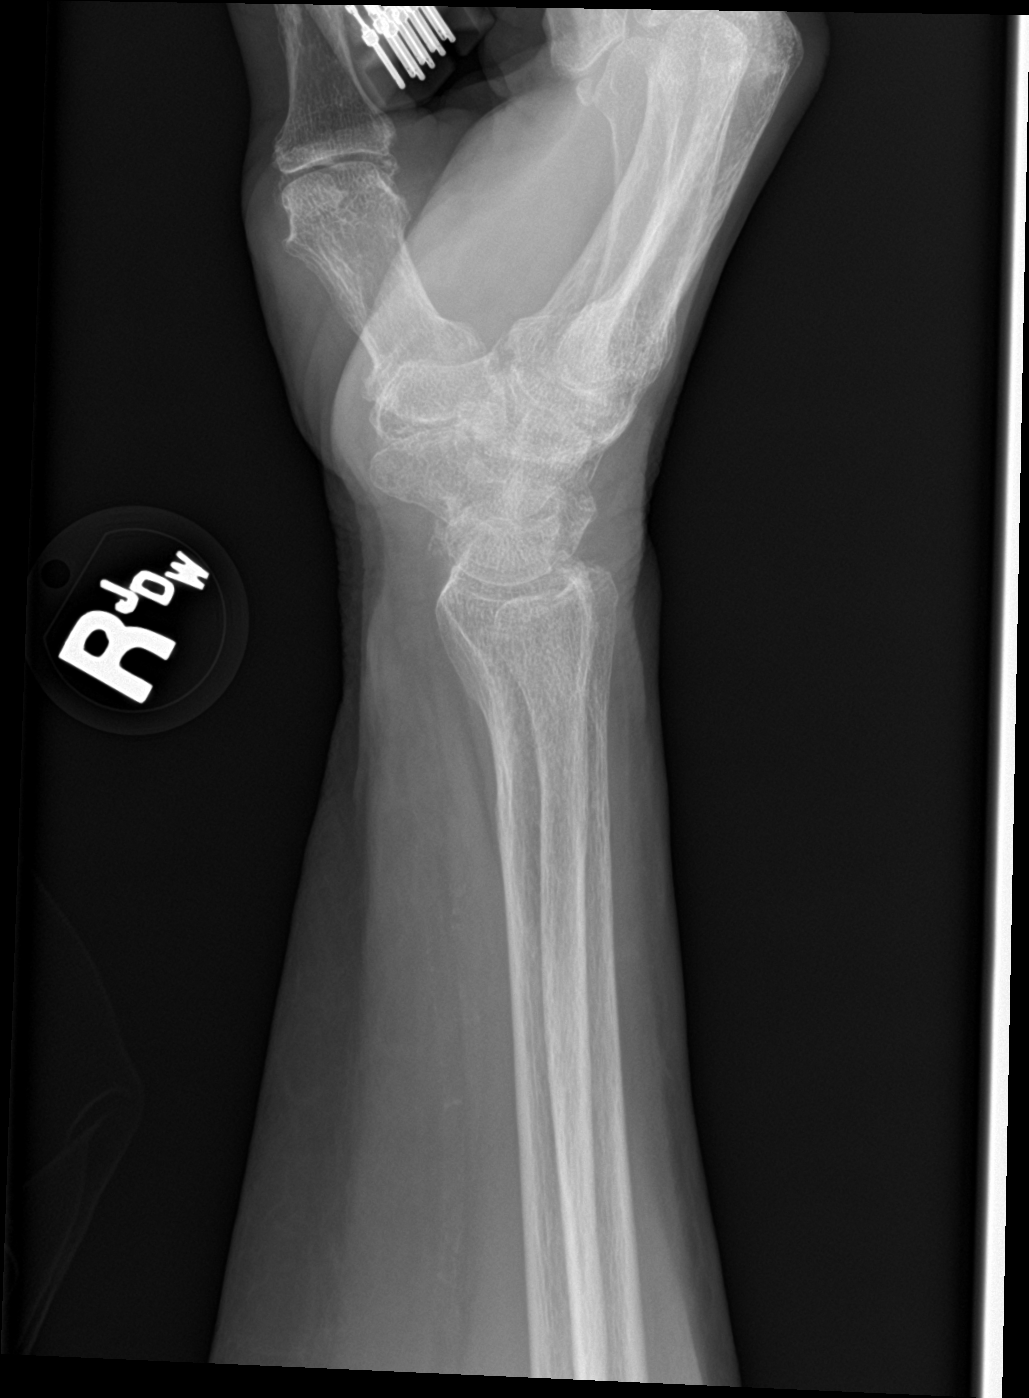

[wrist navicular]
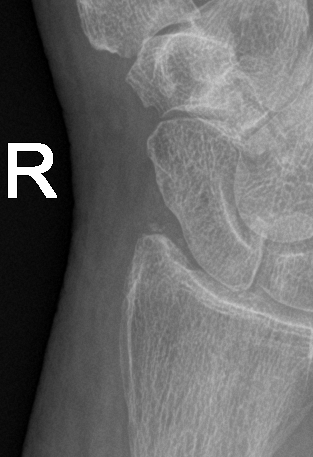

[4 of 4 positions shown; findings below may reference images not displayed]

FINDINGS: Generalized osteopenia. No acute fracture or dislocation. Mild
osteoarthritis of the first CMC joint. Moderate osteoarthritis of
the first MCP joint. Chondrocalcinosis of the right TFCC as can be
seen with CPPD.
IMPRESSION: No acute osseous injury of the right wrist.

## 2017-09-14 IMAGING — DX DG SHOULDER 2+V*R*
3 series · 3 of 3 positions shown · non-contrast
Comparison: None.

CLINICAL DATA: Status post fall from standing position, with right
shoulder bruising. Initial encounter.

EXAM:
RIGHT SHOULDER - 2+ VIEW

[shoulder grashey (1 of 2)]
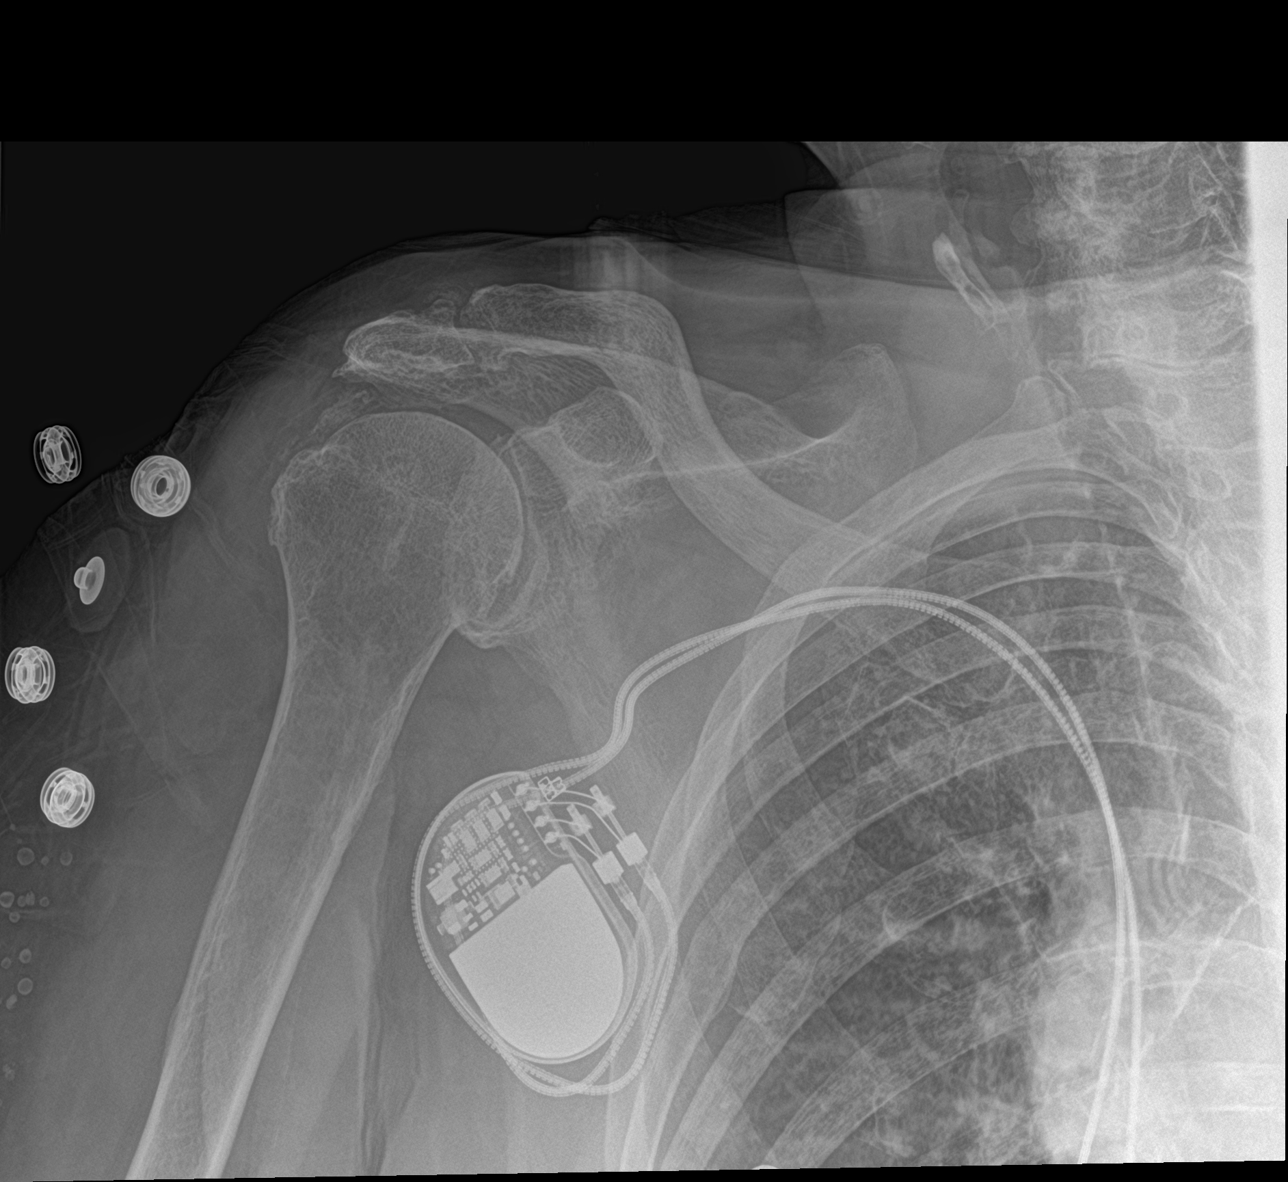

[shoulder y view]
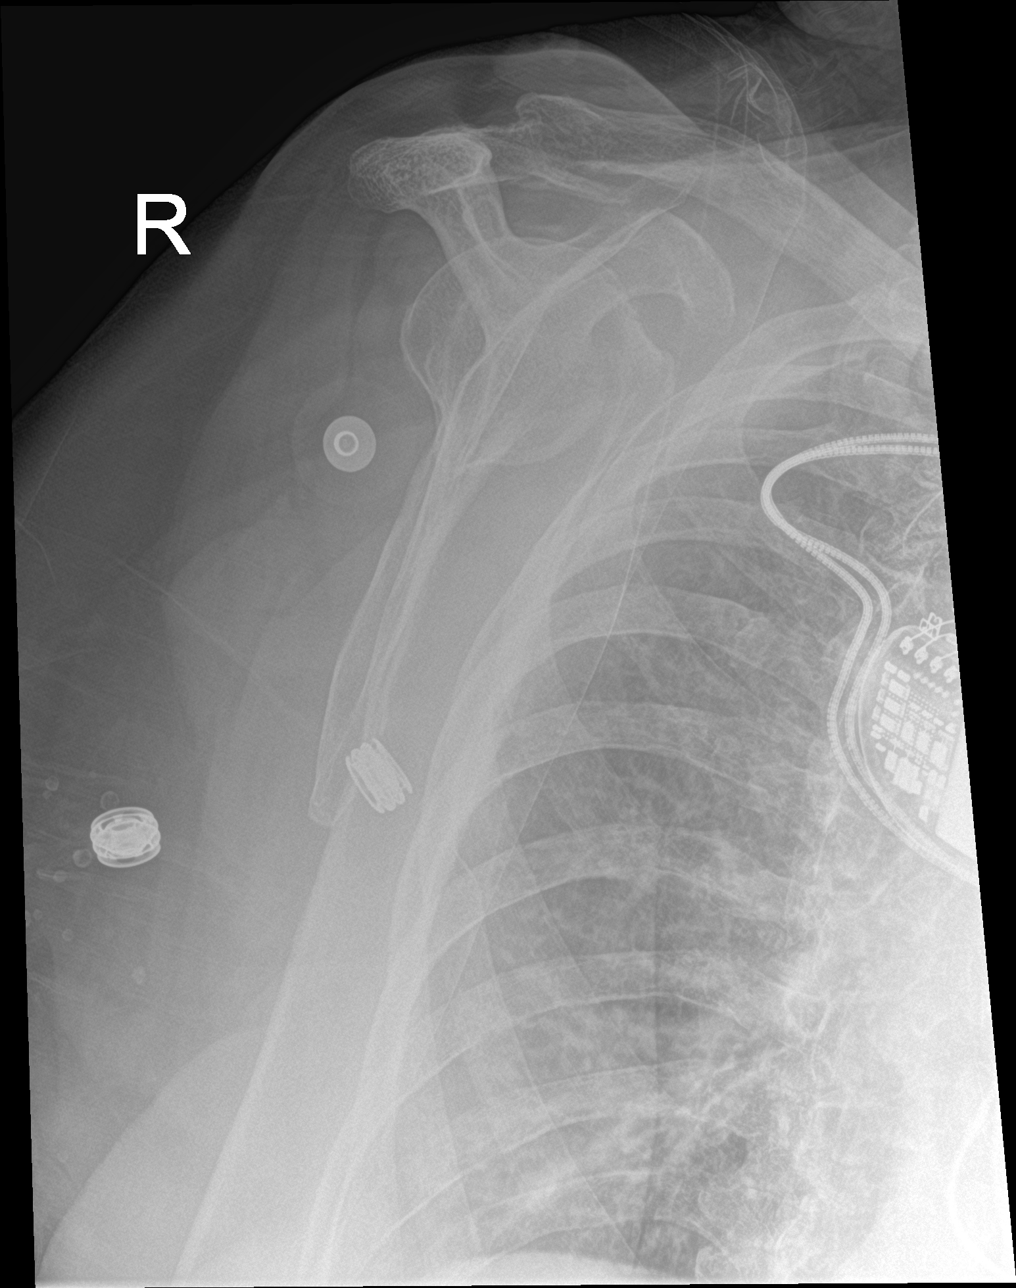

[shoulder grashey (2 of 2)]
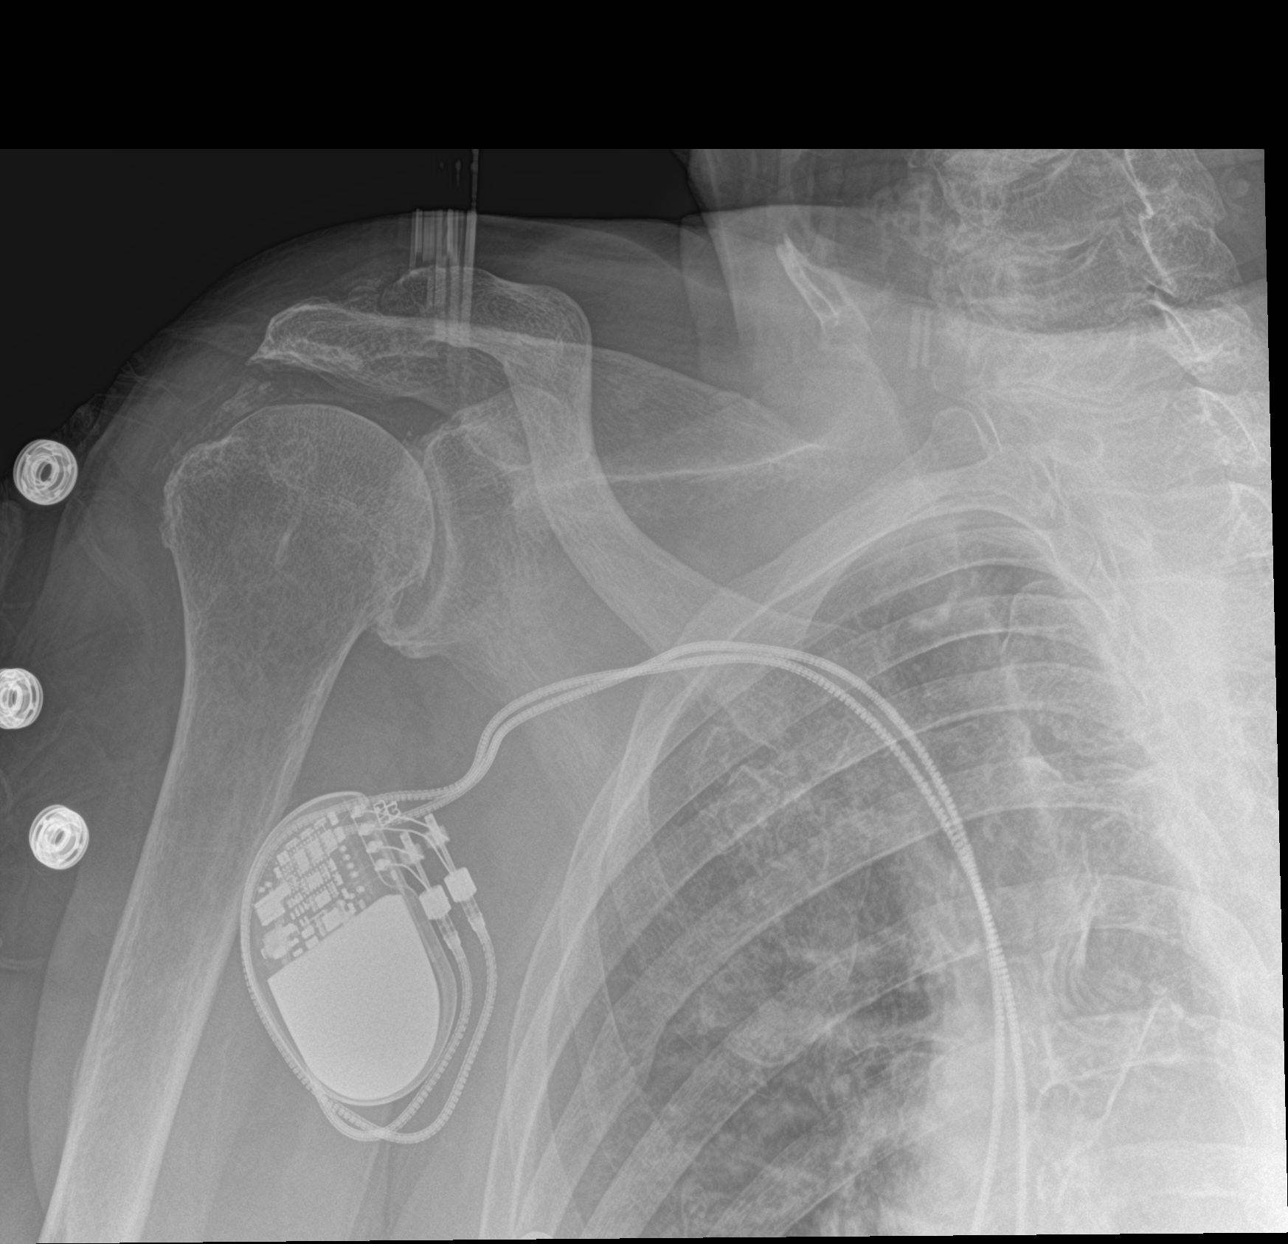

[3 of 3 positions shown; findings below may reference images not displayed]

FINDINGS: There is no evidence of fracture or dislocation. The right humeral
head is seated within the glenoid fossa. Minimal degenerative change
is noted about the glenohumeral joint. Degenerative change is seen
at the right acromioclavicular joint. There is calcification of the
right rotator cuff.

A right-sided pacemaker is noted. The visualized portions of the
right lung are grossly clear.
IMPRESSION: 1. No evidence of fracture or dislocation.
2. Degenerative change at the right acromioclavicular joint.
3. Calcification at the right rotator cuff.

## 2017-09-28 ENCOUNTER — Other Ambulatory Visit (HOSPITAL_COMMUNITY): Payer: Self-pay | Admitting: Internal Medicine

## 2017-09-28 DIAGNOSIS — R1310 Dysphagia, unspecified: Secondary | ICD-10-CM

## 2017-10-02 ENCOUNTER — Ambulatory Visit (HOSPITAL_COMMUNITY)
Admission: RE | Admit: 2017-10-02 | Discharge: 2017-10-02 | Disposition: A | Discharge status: Home / Self Care | Payer: Medicare Other | Source: Ambulatory Visit | Attending: *Deleted | Admitting: *Deleted

## 2017-10-02 ENCOUNTER — Ambulatory Visit (HOSPITAL_COMMUNITY)
Admission: RE | Admit: 2017-10-02 | Discharge: 2017-10-02 | Disposition: A | Discharge status: Home / Self Care | Payer: Medicare Other | Source: Ambulatory Visit | Attending: Internal Medicine | Admitting: Internal Medicine

## 2017-10-02 DIAGNOSIS — Z950 Presence of cardiac pacemaker: Secondary | ICD-10-CM | POA: Insufficient documentation

## 2017-10-02 DIAGNOSIS — K219 Gastro-esophageal reflux disease without esophagitis: Secondary | ICD-10-CM | POA: Diagnosis not present

## 2017-10-02 DIAGNOSIS — Z7901 Long term (current) use of anticoagulants: Secondary | ICD-10-CM | POA: Insufficient documentation

## 2017-10-02 DIAGNOSIS — Z951 Presence of aortocoronary bypass graft: Secondary | ICD-10-CM | POA: Diagnosis not present

## 2017-10-02 DIAGNOSIS — I130 Hypertensive heart and chronic kidney disease with heart failure and stage 1 through stage 4 chronic kidney disease, or unspecified chronic kidney disease: Secondary | ICD-10-CM | POA: Diagnosis not present

## 2017-10-02 DIAGNOSIS — I480 Paroxysmal atrial fibrillation: Secondary | ICD-10-CM | POA: Diagnosis not present

## 2017-10-02 DIAGNOSIS — N189 Chronic kidney disease, unspecified: Secondary | ICD-10-CM | POA: Insufficient documentation

## 2017-10-02 DIAGNOSIS — G4730 Sleep apnea, unspecified: Secondary | ICD-10-CM | POA: Insufficient documentation

## 2017-10-02 DIAGNOSIS — I2510 Atherosclerotic heart disease of native coronary artery without angina pectoris: Secondary | ICD-10-CM | POA: Diagnosis not present

## 2017-10-02 DIAGNOSIS — R001 Bradycardia, unspecified: Secondary | ICD-10-CM | POA: Diagnosis not present

## 2017-10-02 DIAGNOSIS — I429 Cardiomyopathy, unspecified: Secondary | ICD-10-CM | POA: Diagnosis not present

## 2017-10-02 DIAGNOSIS — R05 Cough: Secondary | ICD-10-CM | POA: Diagnosis not present

## 2017-10-02 DIAGNOSIS — M797 Fibromyalgia: Secondary | ICD-10-CM | POA: Insufficient documentation

## 2017-10-02 DIAGNOSIS — Z952 Presence of prosthetic heart valve: Secondary | ICD-10-CM | POA: Insufficient documentation

## 2017-10-02 DIAGNOSIS — Z87442 Personal history of urinary calculi: Secondary | ICD-10-CM | POA: Diagnosis not present

## 2017-10-02 DIAGNOSIS — Z8701 Personal history of pneumonia (recurrent): Secondary | ICD-10-CM | POA: Diagnosis not present

## 2017-10-02 DIAGNOSIS — G629 Polyneuropathy, unspecified: Secondary | ICD-10-CM | POA: Insufficient documentation

## 2017-10-02 DIAGNOSIS — Z86711 Personal history of pulmonary embolism: Secondary | ICD-10-CM | POA: Insufficient documentation

## 2017-10-02 DIAGNOSIS — R1310 Dysphagia, unspecified: Secondary | ICD-10-CM | POA: Insufficient documentation

## 2017-10-02 DIAGNOSIS — Z9981 Dependence on supplemental oxygen: Secondary | ICD-10-CM | POA: Insufficient documentation

## 2017-10-02 DIAGNOSIS — F0390 Unspecified dementia without behavioral disturbance: Secondary | ICD-10-CM | POA: Diagnosis not present

## 2017-10-02 DIAGNOSIS — E039 Hypothyroidism, unspecified: Secondary | ICD-10-CM | POA: Insufficient documentation

## 2017-10-02 DIAGNOSIS — E785 Hyperlipidemia, unspecified: Secondary | ICD-10-CM | POA: Diagnosis not present

## 2017-10-02 DIAGNOSIS — Z8673 Personal history of transient ischemic attack (TIA), and cerebral infarction without residual deficits: Secondary | ICD-10-CM | POA: Diagnosis not present

## 2017-10-02 NOTE — Progress Notes (Signed)
Objective Swallowing Evaluation: Type of Study: MBS-Modified Barium Swallow Study   Patient Details  Name: [name] MRN: [id number] Date of Birth: Dec 30, 1932  Today's Date: 10/02/2017 Time: SLP Start Time (ACUTE ONLY): 1129 -SLP Stop Time (ACUTE ONLY): 1152  SLP Time Calculation (min) (ACUTE ONLY): 23 min   Past Medical History:  Past Medical History:  Diagnosis Date  . Acute on chronic renal failure [facility])    sees Dr Allena Katz   . Anemia    Acute blood loss  . Aortic regurgitation   . Aortic stenosis 10/13/2012   Low EF, low gradient with severe aortic stenosis confirmed by dobutamine stress echocardiogram s/p TAVR 12/2012  . Asthma   . Atrial fibrillation (HCC)    tachy-brady syndrome with <1% recurrent PAF since pacemaker placement  . Cataracts, bilateral   . Chronic combined systolic and diastolic CHF (congestive heart failure) (HCC)   . Chronic lower back pain   . CKD (chronic kidney disease)   . Coronary artery disease involving native coronary artery of native heart   . Dementia    Without behavioral disturbance  . Dysrhythmia   . Fibromyalgia   . Gastroesophageal reflux disease   . H/O dizziness   . H/O urinary frequency   . H/O: stroke   . Hard of hearing   . Headache    hx of migraines   . Heart murmur   . History of blood transfusion   . History of bronchitis   . History of kidney stones   . History of urinary tract infection   . HLD (hyperlipidemia)   . HTN (hypertension)   . Hypothyroidism   . Neuropathy   . Nonischemic cardiomyopathy (HCC)   . On home oxygen therapy    patient uses at nite- 2L- has not used in > 6 months per patient  . Osteoarthritis   . Osteoporosis   . Pneumonia    hx of x 3   . PONV (postoperative nausea and vomiting)   . Presence of permanent cardiac pacemaker   . Pulmonary embolism (HCC)    HISTORY OF, the pt. had a recurrent bilateral pulmonary emboli in 2005, on warfarin therapy and at which time she under went  implantation of IVC filter  . Repeated falls   . Rupture of right patellar tendon   . Sciatica   . Shortness of breath dyspnea    with exertion   . Sleep apnea    uses oxygen at night and PRN- not used since > 6 months / DOES NOT USE  C-PAP  . Spinal stenosis   . Stroke (HCC)   . Symptomatic bradycardia   . Symptomatic bradycardia 2012   s/p Medtronic PPM  . Syncope   . Urinary incontinence   . Urinary urgency    Past Surgical History:  Past Surgical History:  Procedure Laterality Date  . ABDOMINAL HYSTERECTOMY    . APPENDECTOMY    . BACK SURGERY    . BUNIONECTOMY    . CARDIAC CATHETERIZATION    . CATARACT EXTRACTION    . CENTRAL VENOUS CATHETER INSERTION Left 12/21/2012   Procedure: INSERTION CENTRAL LINE ADULT;  Surgeon: Tonny Bollman, MD;  Location: [facility] OR;  Service: Open Heart Surgery;  Laterality: Left;  . ELBOW SURGERY     bilat   . INTRAOPERATIVE TRANSESOPHAGEAL ECHOCARDIOGRAM N/A 12/21/2012   Procedure: INTRAOPERATIVE TRANSESOPHAGEAL ECHOCARDIOGRAM;  Surgeon: Tonny Bollman, MD;  Location: [facility] OR;  Service: Open Heart Surgery;  Laterality: N/A;  .  KNEE SURGERY Left   . LEFT AND RIGHT HEART CATHETERIZATION WITH CORONARY ANGIOGRAM N/A 10/18/2012   Procedure: LEFT AND RIGHT HEART CATHETERIZATION WITH CORONARY ANGIOGRAM;  Surgeon: Tonny Bollman, MD;  Location: [facility] CATH LAB;  Service: Cardiovascular;  Laterality: N/A;  . NASAL SEPTUM SURGERY    . NOSE SURGERY     X 2  . ORIF PATELLA Right 03/08/2015   Procedure:  OPEN REDUCTION INTERNAL FIXATION RIGHT  PATELLA TENDON AVULSION;  Surgeon: Durene Romans, MD;  Location: [facility] ORS;  Service: Orthopedics;  Laterality: Right;  . PACEMAKER INSERTION     Medtronic  . PATELLAR TENDON REPAIR Right 06/25/2015   Procedure: RIGHT PATELLA TENDON REVISION/REPAIR;  Surgeon: Durene Romans, MD;  Location: [facility] ORS;  Service: Orthopedics;  Laterality: Right;  . SHOULDER SURGERY     bilat   . THYROIDECTOMY, PARTIAL    . TONSILLECTOMY    . TOTAL KNEE  ARTHROPLASTY Right 12/04/2014   Procedure: RIGHT TOTAL  KNEE ARTHROPLASTY;  Surgeon: Durene Romans, MD;  Location: [facility] ORS;  Service: Orthopedics;  Laterality: Right;  . TRANSCATHETER AORTIC VALVE REPLACEMENT, TRANSFEMORAL  12/21/2012   a. 38mm Edwards Sapien XT transcatheter heart valve placed via open left transfemoral approach b. Intra-op TEE: well-seated bioprosthetic aortic valve with mean gradient 2 mmHg, trivial AI, mild MR, EF 30-35%  . TRANSCATHETER AORTIC VALVE REPLACEMENT, TRANSFEMORAL N/A 12/21/2012   Procedure: TRANSCATHETER AORTIC VALVE REPLACEMENT, TRANSFEMORAL;  Surgeon: Tonny Bollman, MD;  Location: [facility] OR;  Service: Open Heart Surgery;  Laterality: N/A;   HPI: Pt is 84 yr old seen for outpatient MBS with history of CVA, pna, GERD, CABG, pneumonia, goiter. Symptoms include coughing 2 months ago, get choked easily (more with food)  difficulty masticating meats and difficulty with pills. Pt has had bedside swallow assessments in 2016 and 2018 with suspicions for esophageal impairments.    No data recorded   Assessment / Plan / Recommendation  CHL IP CLINICAL IMPRESSIONS 10/02/2017  Clinical Impression Pt demonstrated mild-moderate oral impairments primarily with regular texture marked by prolonged mastication and transit. Laryngeal flash penetration observed with cup and straw sip which exited the vestibule during the swallow due to incomplete laryngeal elevation and closure. Majority of study the swallow was initiated timely with one episode of thin liquid almost penetrating laryngeal vestibule before closing. Of note pt coughing before and frequently during the study when no barium was observed in the vestibule. No significant pharyngeal residue and orally transited pill in puree without difficulty. Esophagus viewed x 2 without abnormalities with thin and barium pill hesitating at GE junction for approximately 30 seconds before spontaneoulsy entering stomach (MBS does not diagnose esophagus  below the level of the UES). Recommend continue chopped textures, thin liquids, small sips if using straw, pills whole in puree, educated pt re: symtoms to mitigate esophageal globus sensation.   SLP Visit Diagnosis --  Attention and concentration deficit following --  Frontal lobe and executive function deficit following --  Impact on safety and function --      CHL IP TREATMENT RECOMMENDATION 07/22/2016  Treatment Recommendations No treatment recommended at this time     Prognosis 12/22/2012  Prognosis for Safe Diet Advancement Good  Barriers to Reach Goals --  Barriers/Prognosis Comment --    CHL IP DIET RECOMMENDATION 07/22/2016  SLP Diet Recommendations --  Liquid Administration via --  Medication Administration --  Compensations Slow rate;Small sips/bites  Postural Changes --      No flowsheet data found.  CHL IP FOLLOW UP RECOMMENDATIONS 12/22/2012  Follow up Recommendations None      CHL IP FREQUENCY AND DURATION 12/22/2012  Speech Therapy Frequency (ACUTE ONLY) min 2x/week  Treatment Duration 2 weeks           CHL IP ORAL PHASE 10/02/2017  Oral Phase Impaired  Oral - Pudding Teaspoon --  Oral - Pudding Cup --  Oral - Honey Teaspoon --  Oral - Honey Cup --  Oral - Nectar Teaspoon --  Oral - Nectar Cup --  Oral - Nectar Straw --  Oral - Thin Teaspoon --  Oral - Thin Cup WFL  Oral - Thin Straw WFL  Oral - Puree --  Oral - Mech Soft --  Oral - Regular Delayed oral transit;Other (Comment)  Oral - Multi-Consistency --  Oral - Pill WFL;Other (Comment)  Oral Phase - Comment --    CHL IP PHARYNGEAL PHASE 10/02/2017  Pharyngeal Phase Impaired  Pharyngeal- Pudding Teaspoon --  Pharyngeal --  Pharyngeal- Pudding Cup --  Pharyngeal --  Pharyngeal- Honey Teaspoon --  Pharyngeal --  Pharyngeal- Honey Cup --  Pharyngeal --  Pharyngeal- Nectar Teaspoon --  Pharyngeal --  Pharyngeal- Nectar Cup --  Pharyngeal --  Pharyngeal- Nectar Straw --  Pharyngeal --   Pharyngeal- Thin Teaspoon --  Pharyngeal --  Pharyngeal- Thin Cup Penetration/Aspiration during swallow  Pharyngeal Material enters airway, remains ABOVE vocal cords then ejected out  Pharyngeal- Thin Straw Penetration/Aspiration during swallow  Pharyngeal Material enters airway, remains ABOVE vocal cords then ejected out  Pharyngeal- Puree --  Pharyngeal --  Pharyngeal- Mechanical Soft --  Pharyngeal --  Pharyngeal- Regular WFL  Pharyngeal --  Pharyngeal- Multi-consistency --  Pharyngeal --  Pharyngeal- Pill WFL  Pharyngeal --  Pharyngeal Comment --     CHL IP CERVICAL ESOPHAGEAL PHASE 10/02/2017  Cervical Esophageal Phase WFL  Pudding Teaspoon --  Pudding Cup --  Honey Teaspoon --  Honey Cup --  Nectar Teaspoon --  Nectar Cup --  Nectar Straw --  Thin Teaspoon --  Thin Cup --  Thin Straw --  Puree --  Mechanical Soft --  Regular --  Multi-consistency --  Pill --  Cervical Esophageal Comment --    CHL IP GO 12/15/2014  Functional Assessment Tool Used clinical judgement  Functional Limitations Swallowing  Swallow Current Status (Z6109) CI  Swallow Goal Status (U0454) CI  Swallow Discharge Status (U9811) CI  Motor Speech Current Status (B1478) (None)  Motor Speech Goal Status (G9562) (None)  Motor Speech Goal Status (Z3086) (None)  Spoken Language Comprehension Current Status (V7846) (None)  Spoken Language Comprehension Goal Status (N6295) (None)  Spoken Language Comprehension Discharge Status (M8413) (None)  Spoken Language Expression Current Status (K4401) (None)  Spoken Language Expression Goal Status (U2725) (None)  Spoken Language Expression Discharge Status (D6644) (None)  Attention Current Status (I3474) (None)  Attention Goal Status (Q5956) (None)  Attention Discharge Status (L8756) (None)  Memory Current Status (E3329) (None)  Memory Goal Status (J1884) (None)  Memory Discharge Status (Z6606) (None)  Voice Current Status (T0160) (None)  Voice Goal  Status (F0932) (None)  Voice Discharge Status (T5573) (None)  Other Speech-Language Pathology Functional Limitation Current Status (U2025) (None)  Other Speech-Language Pathology Functional Limitation Goal Status (K2706) (None)  Other Speech-Language Pathology Functional Limitation Discharge Status [id number]) (None)    [name] 10/02/2017, 3:21 PM    [name] M.Ed [employer] [phone]

## 2017-10-26 ENCOUNTER — Other Ambulatory Visit (INDEPENDENT_AMBULATORY_CARE_PROVIDER_SITE_OTHER): Payer: Medicare Other

## 2017-10-26 ENCOUNTER — Encounter: Payer: Self-pay | Admitting: Pulmonary Disease

## 2017-10-26 ENCOUNTER — Ambulatory Visit (INDEPENDENT_AMBULATORY_CARE_PROVIDER_SITE_OTHER): Payer: Medicare Other | Admitting: Pulmonary Disease

## 2017-10-26 DIAGNOSIS — J849 Interstitial pulmonary disease, unspecified: Secondary | ICD-10-CM | POA: Diagnosis not present

## 2017-10-26 LAB — CBC WITH DIFFERENTIAL/PLATELET
BASOS ABS: 0.1 10*3/uL (ref 0.0–0.1)
Basophils Relative: 1.5 % (ref 0.0–3.0)
Eosinophils Absolute: 0.1 10*3/uL (ref 0.0–0.7)
Eosinophils Relative: 2 % (ref 0.0–5.0)
HEMATOCRIT: 34.8 % — AB (ref 36.0–46.0)
HEMOGLOBIN: 11.8 g/dL — AB (ref 12.0–15.0)
LYMPHS PCT: 22.2 % (ref 12.0–46.0)
Lymphs Abs: 1.6 10*3/uL (ref 0.7–4.0)
MCHC: 33.9 g/dL (ref 30.0–36.0)
MCV: 98.2 fl (ref 78.0–100.0)
MONOS PCT: 12.4 % — AB (ref 3.0–12.0)
Monocytes Absolute: 0.9 10*3/uL (ref 0.1–1.0)
NEUTROS ABS: 4.4 10*3/uL (ref 1.4–7.7)
Neutrophils Relative %: 61.9 % (ref 43.0–77.0)
PLATELETS: 273 10*3/uL (ref 150.0–400.0)
RBC: 3.54 Mil/uL — AB (ref 3.87–5.11)
RDW: 14 % (ref 11.5–15.5)
WBC: 7.2 10*3/uL (ref 4.0–10.5)

## 2017-10-26 NOTE — Progress Notes (Signed)
[name]    [id number]    03/17/1933  Primary Care Physician:Kim, Fayrene Fearing, MD  Referring Physician: Pearson Grippe, MD [address] [place], [place] [address]  Chief complaint: Consult for cough  HPI: 84 year old with nonischemic cardiomyopathy, chronic systolic heart failure, severe AF status post TAVR, atrial fibrillation, chronic kidney disease Here for evaluation of chronic cough.  She has symptoms for the past 3 months, nonproductive in nature.  Reports incidence of choking on food and coughing while swallowing.  She recently had modified barium swallow with mild to moderate oropharyngeal dysphagia.  Aspiration precautions were recommended.  [name] has been following the recommendations of the past few weeks and reports improving cough  She has a diagnosis of asthma and she had been prescribed inhalers in the past but she cannot recall the name.  Pets: No pets Occupation: Used to work in [employer] Exposures: No known exposures, no mold, hot tub, Jacuzzi Smoking history: Smoker, exposed to secondhand smoke Travel history: She lived in [place] all her life.  No significant travel   Outpatient Encounter Medications as of 10/26/2017  Medication Sig  . acetaminophen (TYLENOL) 325 MG tablet Take 650 mg by mouth every 4 (four) hours as needed for mild pain.  Marland Kitchen alendronate (FOSAMAX) 70 MG/75ML solution Take 70 mg by mouth every Saturday. Take on an empty stomach with a full glass of fluids, remain upright 30 minutes after administration - for bone health. Give 75 mL  . amiodarone (PACERONE) 100 MG tablet Take 100 mg by mouth daily.  Marland Kitchen atorvastatin (LIPITOR) 10 MG tablet Take 10 mg by mouth daily at 6 PM.   . Biotin 5000 MCG CAPS Take 5,000 mcg by mouth daily at 6 PM.  . bisacodyl (DULCOLAX) 10 MG suppository Place 10 mg rectally daily as needed (constipation).   . calcitRIOL (ROCALTROL) 0.25 MCG capsule Take 0.25 mcg by mouth daily.   .  camphor-menthol (SARNA) lotion Apply 1 application topically as needed for itching.  . carvedilol (COREG) 3.125 MG tablet Take 1 tablet (3.125 mg total) by mouth 2 (two) times daily with a meal.  . cholecalciferol (VITAMIN D) 1000 units tablet Take 2,000 Units by mouth at bedtime.  . cycloSPORINE (RESTASIS) 0.05 % ophthalmic emulsion Place 1 drop into both eyes 2 (two) times daily.   Marland Kitchen donepezil (ARICEPT) 10 MG tablet Take 10 mg by mouth at bedtime.   . DULoxetine (CYMBALTA) 60 MG capsule Take 60 mg by mouth daily at 6 PM.   . esomeprazole (NEXIUM) 40 MG capsule Take 40 mg by mouth daily.   . febuxostat (ULORIC) 40 MG tablet Take 20 mg by mouth daily at 6 PM.   . ferrous sulfate 325 (65 FE) MG tablet Take 325 mg by mouth daily.   . hydrALAZINE (APRESOLINE) 10 MG tablet Take 1 tablet (10 mg total) by mouth 3 (three) times daily.  Marland Kitchen HYDROcodone-acetaminophen (NORCO/VICODIN) 5-325 MG tablet Take 1 tablet by mouth 2 (two) times daily as needed (Pain - hold for sedation).  Marland Kitchen ipratropium-albuterol (DUONEB) 0.5-2.5 (3) MG/3ML SOLN Take 3 mLs by nebulization every 6 (six) hours as needed (wheezing/shortness of breath).  Marland Kitchen levothyroxine (SYNTHROID, LEVOTHROID) 112 MCG tablet Take 112 mcg by mouth daily.   . Linaclotide (LINZESS) 145 MCG CAPS capsule Take 145 mcg by mouth daily.   . memantine (NAMENDA) 5 MG tablet Take 5 mg by mouth 2 (two) times daily.   . metolazone (ZAROXOLYN)  2.5 MG tablet Take one tablet by mouth 30 minutes prior to morning dosage of Torsemide if dry weight is 5 lb greater than 168 lbs.  No more than twice per week.  . Multiple Vitamin (MULTIVITAMIN WITH MINERALS) TABS tablet Take 1 tablet by mouth daily.  . multivitamin (RENA-VIT) TABS tablet Take 1 tablet by mouth daily.   . nitroGLYCERIN (NITROSTAT) 0.4 MG SL tablet Place 0.4 mg under the tongue every 5 (five) minutes as needed for chest pain (MAX 3 TABLETS).   . polyethylene glycol (MIRALAX / GLYCOLAX) packet Take 17 g by mouth 2  (two) times daily.  . potassium chloride (KLOR-CON) 20 MEQ packet Take 20 mEq by mouth daily.   . pramoxine (SARNA SENSITIVE) 1 % LOTN Apply 1 application topically as needed (itching).   . pregabalin (LYRICA) 25 MG capsule Take 25 mg by mouth 2 (two) times daily. 6AM and 6PM  . senna-docusate (SENOKOT-S) 8.6-50 MG tablet Take 1 tablet by mouth 2 (two) times daily.  . simethicone (MYLICON) 80 MG chewable tablet Chew 80 mg by mouth every 12 (twelve) hours as needed for flatulence.   . torsemide (DEMADEX) 20 MG tablet Take 40 mg by mouth daily.   . vitamin B-12 (CYANOCOBALAMIN) 1000 MCG tablet Take 1,000 mcg by mouth 2 (two) times a week. Tuesday and Friday  . warfarin (COUMADIN) 2 MG tablet Take 2 mg by mouth daily.  . isosorbide mononitrate (IMDUR) 30 MG 24 hr tablet Take 1 tablet (30 mg total) by mouth daily.   Facility-Administered Encounter Medications as of 10/26/2017  Medication  . sodium bicarbonate first hour bolus via infusion    Allergies as of 10/26/2017 - Review Complete 10/26/2017  Allergen Reaction Noted  . Nubain [nalbuphine hcl] Hives 07/11/2010  . Codeine Hives and Nausea Only 07/11/2010  . Darvocet [propoxyphene n-acetaminophen] Nausea Only 07/11/2010    Past Medical History:  Diagnosis Date  . Acute on chronic renal failure [facility])    sees Dr Allena Katz   . Anemia    Acute blood loss  . Aortic regurgitation   . Aortic stenosis 10/13/2012   Low EF, low gradient with severe aortic stenosis confirmed by dobutamine stress echocardiogram s/p TAVR 12/2012  . Asthma   . Atrial fibrillation (HCC)    tachy-brady syndrome with <1% recurrent PAF since pacemaker placement  . Cataracts, bilateral   . Chronic combined systolic and diastolic CHF (congestive heart failure) (HCC)   . Chronic lower back pain   . CKD (chronic kidney disease)   . Coronary artery disease involving native coronary artery of native heart   . Dementia    Without behavioral disturbance  . Dysrhythmia   .  Fibromyalgia   . Gastroesophageal reflux disease   . H/O dizziness   . H/O urinary frequency   . H/O: stroke   . Hard of hearing   . Headache    hx of migraines   . Heart murmur   . History of blood transfusion   . History of bronchitis   . History of kidney stones   . History of urinary tract infection   . HLD (hyperlipidemia)   . HTN (hypertension)   . Hypothyroidism   . Neuropathy   . Nonischemic cardiomyopathy (HCC)   . On home oxygen therapy    patient uses at nite- 2L- has not used in > 6 months per patient  . Osteoarthritis   . Osteoporosis   . Pneumonia    hx of x 3   .  PONV (postoperative nausea and vomiting)   . Presence of permanent cardiac pacemaker   . Pulmonary embolism (HCC)    HISTORY OF, the pt. had a recurrent bilateral pulmonary emboli in 2005, on warfarin therapy and at which time she under went implantation of IVC filter  . Repeated falls   . Rupture of right patellar tendon   . Sciatica   . Shortness of breath dyspnea    with exertion   . Sleep apnea    uses oxygen at night and PRN- not used since > 6 months / DOES NOT USE  C-PAP  . Spinal stenosis   . Stroke (HCC)   . Symptomatic bradycardia   . Symptomatic bradycardia 2012   s/p Medtronic PPM  . Syncope   . Urinary incontinence   . Urinary urgency     Past Surgical History:  Procedure Laterality Date  . ABDOMINAL HYSTERECTOMY    . APPENDECTOMY    . BACK SURGERY    . BUNIONECTOMY    . CARDIAC CATHETERIZATION    . CATARACT EXTRACTION    . CENTRAL VENOUS CATHETER INSERTION Left 12/21/2012   Procedure: INSERTION CENTRAL LINE ADULT;  Surgeon: Tonny Bollman, MD;  Location: [facility] OR;  Service: Open Heart Surgery;  Laterality: Left;  . ELBOW SURGERY     bilat   . INTRAOPERATIVE TRANSESOPHAGEAL ECHOCARDIOGRAM N/A 12/21/2012   Procedure: INTRAOPERATIVE TRANSESOPHAGEAL ECHOCARDIOGRAM;  Surgeon: Tonny Bollman, MD;  Location: [facility] OR;  Service: Open Heart Surgery;  Laterality: N/A;  . KNEE SURGERY Left     . LEFT AND RIGHT HEART CATHETERIZATION WITH CORONARY ANGIOGRAM N/A 10/18/2012   Procedure: LEFT AND RIGHT HEART CATHETERIZATION WITH CORONARY ANGIOGRAM;  Surgeon: Tonny Bollman, MD;  Location: [facility] CATH LAB;  Service: Cardiovascular;  Laterality: N/A;  . NASAL SEPTUM SURGERY    . NOSE SURGERY     X 2  . ORIF PATELLA Right 03/08/2015   Procedure:  OPEN REDUCTION INTERNAL FIXATION RIGHT  PATELLA TENDON AVULSION;  Surgeon: Durene Romans, MD;  Location: [facility] ORS;  Service: Orthopedics;  Laterality: Right;  . PACEMAKER INSERTION     Medtronic  . PATELLAR TENDON REPAIR Right 06/25/2015   Procedure: RIGHT PATELLA TENDON REVISION/REPAIR;  Surgeon: Durene Romans, MD;  Location: [facility] ORS;  Service: Orthopedics;  Laterality: Right;  . SHOULDER SURGERY     bilat   . THYROIDECTOMY, PARTIAL    . TONSILLECTOMY    . TOTAL KNEE ARTHROPLASTY Right 12/04/2014   Procedure: RIGHT TOTAL  KNEE ARTHROPLASTY;  Surgeon: Durene Romans, MD;  Location: [facility] ORS;  Service: Orthopedics;  Laterality: Right;  . TRANSCATHETER AORTIC VALVE REPLACEMENT, TRANSFEMORAL  12/21/2012   a. 29mm Edwards Sapien XT transcatheter heart valve placed via open left transfemoral approach b. Intra-op TEE: well-seated bioprosthetic aortic valve with mean gradient 2 mmHg, trivial AI, mild MR, EF 30-35%  . TRANSCATHETER AORTIC VALVE REPLACEMENT, TRANSFEMORAL N/A 12/21/2012   Procedure: TRANSCATHETER AORTIC VALVE REPLACEMENT, TRANSFEMORAL;  Surgeon: Tonny Bollman, MD;  Location: [facility] OR;  Service: Open Heart Surgery;  Laterality: N/A;    Family History  Problem Relation Age of Onset  . Ovarian cancer Mother        Deceased  . Epilepsy Father        Deceased    Social History   Socioeconomic History  . Marital status: Divorced    Spouse name: Not on file  . Number of children: Not on file  . Years of education: Not on file  . Highest education level:  Not on file  Occupational History  . Not on file  Social Needs  . Financial resource strain: Not  on file  . Food insecurity:    Worry: Not on file    Inability: Not on file  . Transportation needs:    Medical: Not on file    Non-medical: Not on file  Tobacco Use  . Smoking status: Never Smoker  . Smokeless tobacco: Never Used  Substance and Sexual Activity  . Alcohol use: No  . Drug use: No  . Sexual activity: Not Currently  Lifestyle  . Physical activity:    Days per week: Not on file    Minutes per session: Not on file  . Stress: Not on file  Relationships  . Social connections:    Talks on phone: Not on file    Gets together: Not on file    Attends religious service: Not on file    Active member of club or organization: Not on file    Attends meetings of clubs or organizations: Not on file    Relationship status: Not on file  . Intimate partner violence:    Fear of current or ex partner: Not on file    Emotionally abused: Not on file    Physically abused: Not on file    Forced sexual activity: Not on file  Other Topics Concern  . Not on file  Social History Narrative  . Not on file    Review of systems: Review of Systems  Constitutional: Negative for fever and chills.  HENT: Negative.   Eyes: Negative for blurred vision.  Respiratory: as per HPI  Cardiovascular: Negative for chest pain and palpitations.  Gastrointestinal: Negative for vomiting, diarrhea, blood per rectum. Genitourinary: Negative for dysuria, urgency, frequency and hematuria.  Musculoskeletal: Negative for myalgias, back pain and joint pain.  Skin: Negative for itching and rash.  Neurological: Negative for dizziness, tremors, focal weakness, seizures and loss of consciousness.  Endo/Heme/Allergies: Negative for environmental allergies.  Psychiatric/Behavioral: Negative for depression, suicidal ideas and hallucinations.  All other systems reviewed and are negative.  Physical Exam: Blood pressure 128/70, pulse 88, height 4\' 9"  (1.448 m), weight 159 lb (72.1 kg), SpO2 96 %. Gen:      No  acute distress HEENT:  EOMI, sclera anicteric Neck:     No masses; no thyromegaly Lungs:    Clear to auscultation bilaterally; normal respiratory effort CV:         Regular rate and rhythm; no murmurs Abd:      + bowel sounds; soft, non-tender; no palpable masses, no distension Ext:    No edema; adequate peripheral perfusion Skin:      Warm and dry; no rash Neuro: alert and oriented x 3 Psych: normal mood and affect  Data Reviewed: CT chest 12/14/2014- extensive atherosclerosis, dilated trachea, likely has tracheomalacia, dilated esophagus.  No interstitial lung disease.    Chest x-ray 07/24/2016- bibasilar atelectasis left greater than right, cardiomegaly, aortic atherosclerosis I reviewed the images personally  FENO 10/26/2017-unable to complete  PFTs 12/03/2012 FVC 2 [101%], FEV1 1.46 [100%], F/F 73, TLC 9 9%, DLCO 52%, DLCO/VA 70% Moderate-severe diffusion defect  Assessment:  Evaluation for cough Likely from aspiration as noted by recent swallow eval.  Recommended to continue patient precautions which is apparently helping with her cough Given her history of asthma and amiodarone use we will evaluate for obstructive airway disease, interstitial lung disease  Schedule pulmonary function tests, high-resolution CT, CBC differential, blood allergy profile  Plan/Recommendations: - Aspiration precautions - High-resolution CT, PFTs - CBC with differential, blood allergy profile  Chilton Greathouse MD [facility] 10/26/2017, 3:33 PM  CC: Pearson Grippe, MD

## 2017-10-26 NOTE — Patient Instructions (Addendum)
We will schedule you for high-resolution CT, pulmonary function test Check CBC differential, blood allergy profile  Follow-up in 1 to 2 months.

## 2017-10-27 LAB — RESPIRATORY ALLERGY PROFILE REGION II ~~LOC~~
Allergen, Cottonwood, t14: <0.1 kU/L
Allergen, Oak,t7: <0.1 kU/L
Aspergillus fumigatus, m3: <0.1 kU/L
Bermuda Grass: <0.1 kU/L
Box Elder IgE: <0.1 kU/L
CLASS: 0
CLASS: 0
CLASS: 0
CLASS: 0
CLASS: 0
CLASS: 0
CLASS: 0
CLASS: 0
CLASS: 0
CLASS: 0
CLASS: 0
Class: 0
Class: 0
Class: 0
Class: 0
Class: 0
Class: 0
Class: 0
Class: 0
Class: 0
Class: 0
Class: 0
Class: 0
Class: 0
Cockroach: <0.1 kU/L
Dog Dander: <0.1 kU/L
Elm IgE: <0.1 kU/L
IgE (Immunoglobulin E), Serum: <2 kU/L (ref <=114)
Johnson Grass: <0.1 kU/L
Rough Pigweed  IgE: <0.1 kU/L
Sheep Sorrel IgE: <0.1 kU/L

## 2017-10-27 LAB — INTERPRETATION:

## 2017-11-03 ENCOUNTER — Ambulatory Visit (INDEPENDENT_AMBULATORY_CARE_PROVIDER_SITE_OTHER)
Admission: RE | Admit: 2017-11-03 | Discharge: 2017-11-03 | Disposition: A | Discharge status: Home / Self Care | Payer: Medicare Other | Source: Ambulatory Visit | Attending: Pulmonary Disease | Admitting: Pulmonary Disease

## 2017-11-03 DIAGNOSIS — J849 Interstitial pulmonary disease, unspecified: Secondary | ICD-10-CM | POA: Diagnosis not present

## 2017-11-07 ENCOUNTER — Encounter (HOSPITAL_COMMUNITY): Payer: Self-pay

## 2017-11-07 ENCOUNTER — Inpatient Hospital Stay (HOSPITAL_COMMUNITY)
Admission: EM | Admit: 2017-11-07 | Discharge: 2017-11-12 | DRG: 291 | Disposition: A | Discharge status: Skilled Nursing Facility | Payer: Medicare Other | Attending: Internal Medicine | Admitting: Internal Medicine

## 2017-11-07 ENCOUNTER — Emergency Department (HOSPITAL_COMMUNITY): Payer: Medicare Other

## 2017-11-07 ENCOUNTER — Other Ambulatory Visit: Payer: Self-pay

## 2017-11-07 DIAGNOSIS — G629 Polyneuropathy, unspecified: Secondary | ICD-10-CM | POA: Diagnosis present

## 2017-11-07 DIAGNOSIS — Z86718 Personal history of other venous thrombosis and embolism: Secondary | ICD-10-CM

## 2017-11-07 DIAGNOSIS — Z7983 Long term (current) use of bisphosphonates: Secondary | ICD-10-CM

## 2017-11-07 DIAGNOSIS — J810 Acute pulmonary edema: Secondary | ICD-10-CM

## 2017-11-07 DIAGNOSIS — I248 Other forms of acute ischemic heart disease: Secondary | ICD-10-CM | POA: Diagnosis present

## 2017-11-07 DIAGNOSIS — K5909 Other constipation: Secondary | ICD-10-CM | POA: Diagnosis present

## 2017-11-07 DIAGNOSIS — Z8673 Personal history of transient ischemic attack (TIA), and cerebral infarction without residual deficits: Secondary | ICD-10-CM | POA: Diagnosis not present

## 2017-11-07 DIAGNOSIS — R748 Abnormal levels of other serum enzymes: Secondary | ICD-10-CM | POA: Diagnosis not present

## 2017-11-07 DIAGNOSIS — E785 Hyperlipidemia, unspecified: Secondary | ICD-10-CM | POA: Diagnosis present

## 2017-11-07 DIAGNOSIS — M109 Gout, unspecified: Secondary | ICD-10-CM | POA: Diagnosis present

## 2017-11-07 DIAGNOSIS — I480 Paroxysmal atrial fibrillation: Secondary | ICD-10-CM | POA: Diagnosis present

## 2017-11-07 DIAGNOSIS — N179 Acute kidney failure, unspecified: Secondary | ICD-10-CM | POA: Diagnosis present

## 2017-11-07 DIAGNOSIS — Z6835 Body mass index (BMI) 35.0-35.9, adult: Secondary | ICD-10-CM | POA: Diagnosis not present

## 2017-11-07 DIAGNOSIS — Z9981 Dependence on supplemental oxygen: Secondary | ICD-10-CM | POA: Diagnosis not present

## 2017-11-07 DIAGNOSIS — Z950 Presence of cardiac pacemaker: Secondary | ICD-10-CM

## 2017-11-07 DIAGNOSIS — I5043 Acute on chronic combined systolic (congestive) and diastolic (congestive) heart failure: Secondary | ICD-10-CM | POA: Diagnosis present

## 2017-11-07 DIAGNOSIS — I429 Cardiomyopathy, unspecified: Secondary | ICD-10-CM | POA: Diagnosis present

## 2017-11-07 DIAGNOSIS — Z96651 Presence of right artificial knee joint: Secondary | ICD-10-CM | POA: Diagnosis present

## 2017-11-07 DIAGNOSIS — J9601 Acute respiratory failure with hypoxia: Secondary | ICD-10-CM | POA: Diagnosis not present

## 2017-11-07 DIAGNOSIS — F0390 Unspecified dementia without behavioral disturbance: Secondary | ICD-10-CM | POA: Diagnosis present

## 2017-11-07 DIAGNOSIS — I350 Nonrheumatic aortic (valve) stenosis: Secondary | ICD-10-CM | POA: Diagnosis not present

## 2017-11-07 DIAGNOSIS — E039 Hypothyroidism, unspecified: Secondary | ICD-10-CM | POA: Diagnosis present

## 2017-11-07 DIAGNOSIS — F0150 Vascular dementia without behavioral disturbance: Secondary | ICD-10-CM

## 2017-11-07 DIAGNOSIS — Z79899 Other long term (current) drug therapy: Secondary | ICD-10-CM

## 2017-11-07 DIAGNOSIS — R778 Other specified abnormalities of plasma proteins: Secondary | ICD-10-CM | POA: Diagnosis present

## 2017-11-07 DIAGNOSIS — J9621 Acute and chronic respiratory failure with hypoxia: Secondary | ICD-10-CM | POA: Diagnosis present

## 2017-11-07 DIAGNOSIS — N184 Chronic kidney disease, stage 4 (severe): Secondary | ICD-10-CM | POA: Diagnosis present

## 2017-11-07 DIAGNOSIS — Z79890 Hormone replacement therapy (postmenopausal): Secondary | ICD-10-CM

## 2017-11-07 DIAGNOSIS — Z9849 Cataract extraction status, unspecified eye: Secondary | ICD-10-CM | POA: Diagnosis not present

## 2017-11-07 DIAGNOSIS — R7989 Other specified abnormal findings of blood chemistry: Secondary | ICD-10-CM | POA: Diagnosis present

## 2017-11-07 DIAGNOSIS — I130 Hypertensive heart and chronic kidney disease with heart failure and stage 1 through stage 4 chronic kidney disease, or unspecified chronic kidney disease: Secondary | ICD-10-CM | POA: Diagnosis present

## 2017-11-07 DIAGNOSIS — N189 Chronic kidney disease, unspecified: Secondary | ICD-10-CM | POA: Diagnosis present

## 2017-11-07 DIAGNOSIS — E031 Congenital hypothyroidism without goiter: Secondary | ICD-10-CM | POA: Diagnosis not present

## 2017-11-07 DIAGNOSIS — Z885 Allergy status to narcotic agent status: Secondary | ICD-10-CM | POA: Diagnosis not present

## 2017-11-07 DIAGNOSIS — M4800 Spinal stenosis, site unspecified: Secondary | ICD-10-CM | POA: Diagnosis present

## 2017-11-07 DIAGNOSIS — Z952 Presence of prosthetic heart valve: Secondary | ICD-10-CM | POA: Diagnosis not present

## 2017-11-07 DIAGNOSIS — I361 Nonrheumatic tricuspid (valve) insufficiency: Secondary | ICD-10-CM | POA: Diagnosis not present

## 2017-11-07 DIAGNOSIS — I5023 Acute on chronic systolic (congestive) heart failure: Secondary | ICD-10-CM | POA: Diagnosis present

## 2017-11-07 DIAGNOSIS — Z90710 Acquired absence of both cervix and uterus: Secondary | ICD-10-CM | POA: Diagnosis not present

## 2017-11-07 DIAGNOSIS — Z888 Allergy status to other drugs, medicaments and biological substances status: Secondary | ICD-10-CM

## 2017-11-07 DIAGNOSIS — I371 Nonrheumatic pulmonary valve insufficiency: Secondary | ICD-10-CM | POA: Diagnosis not present

## 2017-11-07 DIAGNOSIS — E669 Obesity, unspecified: Secondary | ICD-10-CM | POA: Diagnosis present

## 2017-11-07 DIAGNOSIS — R0602 Shortness of breath: Secondary | ICD-10-CM | POA: Diagnosis present

## 2017-11-07 DIAGNOSIS — Z7901 Long term (current) use of anticoagulants: Secondary | ICD-10-CM

## 2017-11-07 DIAGNOSIS — Z86711 Personal history of pulmonary embolism: Secondary | ICD-10-CM

## 2017-11-07 LAB — CBC WITH DIFFERENTIAL/PLATELET
ABS IMMATURE GRANULOCYTES: 0.1 10*3/uL (ref 0.0–0.1)
Basophils Absolute: 0.1 10*3/uL (ref 0.0–0.1)
Basophils Relative: 0 %
Eosinophils Absolute: 0.2 10*3/uL (ref 0.0–0.7)
Eosinophils Relative: 2 %
HEMATOCRIT: 41.3 % (ref 36.0–46.0)
Hemoglobin: 13.4 g/dL (ref 12.0–15.0)
IMMATURE GRANULOCYTES: 1 %
LYMPHS ABS: 2.8 10*3/uL (ref 0.7–4.0)
Lymphocytes Relative: 19 %
MCH: 32.8 pg (ref 26.0–34.0)
MCHC: 32.4 g/dL (ref 30.0–36.0)
MCV: 101.2 fL — AB (ref 78.0–100.0)
MONOS PCT: 3 %
Monocytes Absolute: 0.5 10*3/uL (ref 0.1–1.0)
NEUTROS ABS: 11 10*3/uL — AB (ref 1.7–7.7)
NEUTROS PCT: 75 %
Platelets: 262 10*3/uL (ref 150–400)
RBC: 4.08 MIL/uL (ref 3.87–5.11)
RDW: 14.1 % (ref 11.5–15.5)
WBC: 14.6 10*3/uL — ABNORMAL HIGH (ref 4.0–10.5)

## 2017-11-07 LAB — BASIC METABOLIC PANEL
ANION GAP: 13 (ref 5–15)
BUN: 41 mg/dL — ABNORMAL HIGH (ref 8–23)
CHLORIDE: 97 mmol/L — AB (ref 98–111)
CO2: 25 mmol/L (ref 22–32)
Calcium: 9.2 mg/dL (ref 8.9–10.3)
Creatinine, Ser: 2.15 mg/dL — ABNORMAL HIGH (ref 0.44–1.00)
GFR calc non Af Amer: 20 mL/min — ABNORMAL LOW (ref >60)
GFR, EST AFRICAN AMERICAN: 23 mL/min — AB (ref >60)
Glucose, Bld: 258 mg/dL — ABNORMAL HIGH (ref 70–99)
Potassium: 5.1 mmol/L (ref 3.5–5.1)
Sodium: 135 mmol/L (ref 135–145)

## 2017-11-07 LAB — MAGNESIUM: Magnesium: 1.8 mg/dL (ref 1.7–2.4)

## 2017-11-07 LAB — I-STAT CHEM 8, ED
BUN: 64 mg/dL — ABNORMAL HIGH (ref 8–23)
BUN: 71 mg/dL — AB (ref 8–23)
CALCIUM ION: 1.09 mmol/L — AB (ref 1.15–1.40)
CALCIUM ION: 1.08 mmol/L — AB (ref 1.15–1.40)
CREATININE: 2.1 mg/dL — AB (ref 0.44–1.00)
Chloride: 99 mmol/L (ref 98–111)
Chloride: 98 mmol/L (ref 98–111)
Creatinine, Ser: 2.1 mg/dL — ABNORMAL HIGH (ref 0.44–1.00)
GLUCOSE: 129 mg/dL — AB (ref 70–99)
Glucose, Bld: 254 mg/dL — ABNORMAL HIGH (ref 70–99)
HCT: 42 % (ref 36.0–46.0)
HEMATOCRIT: 38 % (ref 36.0–46.0)
HEMOGLOBIN: 14.3 g/dL (ref 12.0–15.0)
Hemoglobin: 12.9 g/dL (ref 12.0–15.0)
Potassium: 5 mmol/L (ref 3.5–5.1)
Potassium: 7.9 mmol/L (ref 3.5–5.1)
SODIUM: 132 mmol/L — AB (ref 135–145)
Sodium: 131 mmol/L — ABNORMAL LOW (ref 135–145)
TCO2: 27 mmol/L (ref 22–32)
TCO2: 31 mmol/L (ref 22–32)

## 2017-11-07 LAB — POTASSIUM: Potassium: 3.8 mmol/L (ref 3.5–5.1)

## 2017-11-07 LAB — TROPONIN I
TROPONIN I: 0.32 ng/mL — AB (ref <0.03)
TROPONIN I: 0.33 ng/mL — AB (ref <0.03)

## 2017-11-07 LAB — I-STAT TROPONIN, ED: Troponin i, poc: 0.16 ng/mL (ref 0.00–0.08)

## 2017-11-07 LAB — TSH: TSH: 1.505 u[IU]/mL (ref 0.350–4.500)

## 2017-11-07 LAB — I-STAT CG4 LACTIC ACID, ED: LACTIC ACID, VENOUS: 1.56 mmol/L (ref 0.5–1.9)

## 2017-11-07 LAB — BRAIN NATRIURETIC PEPTIDE: B NATRIURETIC PEPTIDE 5: 491.3 pg/mL — AB (ref 0.0–100.0)

## 2017-11-07 LAB — PROTIME-INR
INR: 2.12
PROTHROMBIN TIME: 23.6 s — AB (ref 11.4–15.2)

## 2017-11-07 LAB — MRSA PCR SCREENING: MRSA by PCR: POSITIVE — AB

## 2017-11-07 MED ORDER — DONEPEZIL HCL 5 MG PO TABS
10.0000 mg | ORAL_TABLET | Freq: Every day | ORAL | Status: DC
  Administered 2017-11-07 – 2017-11-11 (×5): 10 mg via ORAL
  Filled 2017-11-07: qty 2
  Filled 2017-11-07: qty 1
  Filled 2017-11-07 (×5): qty 2

## 2017-11-07 MED ORDER — NITROGLYCERIN IN D5W 200-5 MCG/ML-% IV SOLN
0.0000 ug/min | Freq: Once | INTRAVENOUS | Status: AC
  Administered 2017-11-07: 5 ug/min via INTRAVENOUS
  Filled 2017-11-07: qty 250

## 2017-11-07 MED ORDER — SODIUM CHLORIDE 0.9% FLUSH: 3.0000 mL | Freq: Two times a day (BID) | INTRAVENOUS | Status: DC

## 2017-11-07 MED ORDER — WARFARIN - PHARMACIST DOSING INPATIENT
Freq: Every day | Status: DC
  Administered 2017-11-07 – 2017-11-11 (×3)

## 2017-11-07 MED ORDER — DULOXETINE HCL 60 MG PO CPEP
60.0000 mg | ORAL_CAPSULE | Freq: Every day | ORAL | Status: DC
  Administered 2017-11-07 – 2017-11-12 (×6): 60 mg via ORAL
  Filled 2017-11-07 (×6): qty 1

## 2017-11-07 MED ORDER — METOLAZONE 2.5 MG PO TABS
2.5000 mg | ORAL_TABLET | Freq: Once | ORAL | Status: AC
  Administered 2017-11-07: 2.5 mg via ORAL
  Filled 2017-11-07: qty 1

## 2017-11-07 MED ORDER — SODIUM CHLORIDE 0.9 % IV SOLN: 250.0000 mL | INTRAVENOUS | Status: DC | PRN

## 2017-11-07 MED ORDER — ORAL CARE MOUTH RINSE
15.0000 mL | Freq: Two times a day (BID) | OROMUCOSAL | Status: DC
  Administered 2017-11-07 – 2017-11-12 (×8): 15 mL via OROMUCOSAL

## 2017-11-07 MED ORDER — CARVEDILOL 3.125 MG PO TABS
3.1250 mg | ORAL_TABLET | Freq: Two times a day (BID) | ORAL | Status: DC
  Administered 2017-11-08 – 2017-11-12 (×10): 3.125 mg via ORAL
  Filled 2017-11-07 (×10): qty 1

## 2017-11-07 MED ORDER — HYDROCODONE-ACETAMINOPHEN 5-325 MG PO TABS: 1.0000 | ORAL_TABLET | Freq: Every day | ORAL | Status: DC

## 2017-11-07 MED ORDER — POTASSIUM CHLORIDE 20 MEQ PO PACK
40.0000 meq | PACK | Freq: Two times a day (BID) | ORAL | Status: DC
  Filled 2017-11-07: qty 2

## 2017-11-07 MED ORDER — MUSCLE RUB 10-15 % EX CREA
1.0000 "application " | TOPICAL_CREAM | Freq: Two times a day (BID) | CUTANEOUS | Status: DC
  Administered 2017-11-07 – 2017-11-12 (×7): 1 via TOPICAL
  Filled 2017-11-07: qty 85

## 2017-11-07 MED ORDER — GABAPENTIN 100 MG PO CAPS
200.0000 mg | ORAL_CAPSULE | Freq: Two times a day (BID) | ORAL | Status: DC
  Administered 2017-11-07 – 2017-11-12 (×12): 200 mg via ORAL
  Filled 2017-11-07 (×13): qty 2

## 2017-11-07 MED ORDER — ATORVASTATIN CALCIUM 10 MG PO TABS
10.0000 mg | ORAL_TABLET | Freq: Every day | ORAL | Status: DC
  Administered 2017-11-07 – 2017-11-12 (×6): 10 mg via ORAL
  Filled 2017-11-07 (×6): qty 1

## 2017-11-07 MED ORDER — FUROSEMIDE 10 MG/ML IJ SOLN
80.0000 mg | Freq: Once | INTRAMUSCULAR | Status: AC
  Administered 2017-11-07: 80 mg via INTRAVENOUS
  Filled 2017-11-07: qty 8

## 2017-11-07 MED ORDER — TRAZODONE HCL 50 MG PO TABS
50.0000 mg | ORAL_TABLET | Freq: Every day | ORAL | Status: DC
  Administered 2017-11-07 – 2017-11-11 (×5): 50 mg via ORAL
  Filled 2017-11-07 (×5): qty 1

## 2017-11-07 MED ORDER — POLYETHYLENE GLYCOL 3350 17 G PO PACK
17.0000 g | PACK | Freq: Two times a day (BID) | ORAL | Status: DC
  Administered 2017-11-07 – 2017-11-11 (×5): 17 g via ORAL
  Filled 2017-11-07 (×9): qty 1

## 2017-11-07 MED ORDER — PANTOPRAZOLE SODIUM 40 MG PO TBEC
40.0000 mg | DELAYED_RELEASE_TABLET | Freq: Every day | ORAL | Status: DC
  Administered 2017-11-07 – 2017-11-12 (×6): 40 mg via ORAL
  Filled 2017-11-07 (×6): qty 1

## 2017-11-07 MED ORDER — IPRATROPIUM-ALBUTEROL 0.5-2.5 (3) MG/3ML IN SOLN: 3.0000 mL | RESPIRATORY_TRACT | Status: DC | PRN

## 2017-11-07 MED ORDER — HYDROCODONE-ACETAMINOPHEN 5-325 MG PO TABS
1.0000 | ORAL_TABLET | ORAL | Status: DC | PRN
  Administered 2017-11-07 – 2017-11-12 (×18): 1 via ORAL
  Filled 2017-11-07 (×18): qty 1

## 2017-11-07 MED ORDER — ISOSORBIDE MONONITRATE ER 30 MG PO TB24
30.0000 mg | ORAL_TABLET | Freq: Every day | ORAL | Status: DC
  Administered 2017-11-07 – 2017-11-12 (×6): 30 mg via ORAL
  Filled 2017-11-07 (×6): qty 1

## 2017-11-07 MED ORDER — MELATONIN 3 MG PO TABS
3.0000 mg | ORAL_TABLET | Freq: Every day | ORAL | Status: DC
  Administered 2017-11-07 – 2017-11-11 (×5): 3 mg via ORAL
  Filled 2017-11-07 (×6): qty 1

## 2017-11-07 MED ORDER — POTASSIUM CHLORIDE 20 MEQ PO PACK
20.0000 meq | PACK | Freq: Every day | ORAL | Status: DC
  Administered 2017-11-07: 20 meq via ORAL
  Filled 2017-11-07: qty 1

## 2017-11-07 MED ORDER — FUROSEMIDE 10 MG/ML IJ SOLN
80.0000 mg | Freq: Two times a day (BID) | INTRAMUSCULAR | Status: DC
  Administered 2017-11-07 – 2017-11-10 (×7): 80 mg via INTRAVENOUS
  Filled 2017-11-07 (×7): qty 8

## 2017-11-07 MED ORDER — CALCITRIOL 0.25 MCG PO CAPS
0.2500 ug | ORAL_CAPSULE | Freq: Every day | ORAL | Status: DC
  Administered 2017-11-07 – 2017-11-12 (×6): 0.25 ug via ORAL
  Filled 2017-11-07 (×6): qty 1

## 2017-11-07 MED ORDER — ONDANSETRON HCL 4 MG/2ML IJ SOLN: 4.0000 mg | Freq: Four times a day (QID) | INTRAMUSCULAR | Status: DC | PRN

## 2017-11-07 MED ORDER — BISACODYL 10 MG RE SUPP
10.0000 mg | Freq: Every day | RECTAL | Status: DC
  Administered 2017-11-07: 10 mg via RECTAL
  Filled 2017-11-07 (×2): qty 1

## 2017-11-07 MED ORDER — FERROUS SULFATE 325 (65 FE) MG PO TABS
325.0000 mg | ORAL_TABLET | Freq: Every day | ORAL | Status: DC
  Administered 2017-11-08 – 2017-11-12 (×5): 325 mg via ORAL
  Filled 2017-11-07 (×6): qty 1

## 2017-11-07 MED ORDER — MAGNESIUM SULFATE IN D5W 1-5 GM/100ML-% IV SOLN
1.0000 g | Freq: Once | INTRAVENOUS | Status: AC
  Administered 2017-11-07: 1 g via INTRAVENOUS
  Filled 2017-11-07: qty 100

## 2017-11-07 MED ORDER — AMIODARONE HCL 200 MG PO TABS
100.0000 mg | ORAL_TABLET | Freq: Every day | ORAL | Status: DC
  Administered 2017-11-07 – 2017-11-12 (×6): 100 mg via ORAL
  Filled 2017-11-07 (×6): qty 1

## 2017-11-07 MED ORDER — LEVOTHYROXINE SODIUM 100 MCG PO TABS
100.0000 ug | ORAL_TABLET | Freq: Every day | ORAL | Status: DC
  Administered 2017-11-08 – 2017-11-11 (×6): 100 ug via ORAL
  Filled 2017-11-07 (×7): qty 1

## 2017-11-07 MED ORDER — WARFARIN SODIUM 2 MG PO TABS
2.0000 mg | ORAL_TABLET | Freq: Every day | ORAL | Status: DC
  Administered 2017-11-07 – 2017-11-11 (×5): 2 mg via ORAL
  Filled 2017-11-07 (×5): qty 1

## 2017-11-07 MED ORDER — SODIUM CHLORIDE 0.9% FLUSH: 3.0000 mL | INTRAVENOUS | Status: DC | PRN

## 2017-11-07 MED ORDER — ACETAMINOPHEN 325 MG PO TABS
650.0000 mg | ORAL_TABLET | ORAL | Status: DC | PRN
  Administered 2017-11-08 – 2017-11-11 (×3): 650 mg via ORAL
  Filled 2017-11-07 (×2): qty 2

## 2017-11-07 MED ORDER — CYCLOSPORINE 0.05 % OP EMUL
1.0000 [drp] | Freq: Two times a day (BID) | OPHTHALMIC | Status: DC
  Administered 2017-11-07 – 2017-11-12 (×10): 1 [drp] via OPHTHALMIC
  Filled 2017-11-07 (×13): qty 1

## 2017-11-07 MED ORDER — ALLOPURINOL 100 MG PO TABS
100.0000 mg | ORAL_TABLET | Freq: Every day | ORAL | Status: DC
  Administered 2017-11-07 – 2017-11-12 (×6): 100 mg via ORAL
  Filled 2017-11-07 (×6): qty 1

## 2017-11-07 MED ORDER — SENNA 8.6 MG PO TABS
1.0000 | ORAL_TABLET | Freq: Two times a day (BID) | ORAL | Status: DC
  Administered 2017-11-07 – 2017-11-12 (×8): 8.6 mg via ORAL
  Filled 2017-11-07 (×11): qty 1

## 2017-11-07 MED ORDER — SALINE SPRAY 0.65 % NA SOLN
1.0000 | Freq: Three times a day (TID) | NASAL | Status: DC
  Administered 2017-11-07 – 2017-11-12 (×16): 1 via NASAL
  Filled 2017-11-07: qty 44

## 2017-11-07 MED ORDER — LINACLOTIDE 145 MCG PO CAPS
190.0000 ug | ORAL_CAPSULE | Freq: Every day | ORAL | Status: DC
  Administered 2017-11-07 – 2017-11-10 (×4): 145 ug via ORAL
  Filled 2017-11-07 (×4): qty 1

## 2017-11-07 MED ORDER — HYDROCODONE-ACETAMINOPHEN 5-325 MG PO TABS
1.0000 | ORAL_TABLET | Freq: Four times a day (QID) | ORAL | Status: DC | PRN
  Administered 2017-11-07 (×2): 1 via ORAL
  Filled 2017-11-07 (×2): qty 1

## 2017-11-07 NOTE — Progress Notes (Signed)
Patient ID: [name], female   DOB: 03-27-1933, 84 y.o.   MRN: [id number]  Cardiology follow-up note  Patient was admitted early this morning with heart failure and is improving nicely.  She will continue her current medical care.  Lewayne Bunting, MD

## 2017-11-07 NOTE — Progress Notes (Signed)
@IPLOG @        PROGRESS NOTE                                                                                                                                                                                                             Patient Demographics:    [name], is a 84 y.o. female, DOB - 1932/09/13, ZOX:[id number]  Admit date - 11/07/2017   Admitting Physician Clydie Braun, MD  Outpatient Primary MD for the patient is Pearson Grippe, MD  LOS - 0  Chief Complaint  Patient presents with  . Respiratory Distress       Brief Narrative  [name] is a 84 y.o. female with medical history significant of HTN, HLD, oxygen dependent chronic respiratory failure, CKD 4, paroxysmal A. fib, chronic combined systolic and diastolic CHF last EF 35-40% with grade 2 DD., fibromyalgia, dementia, nonischemic cardiomyopathy, DVT/PE, symptomatic bradycardia s/p PM placement, CVA, OSA, hypothyroidism; who presented from her nursing facility with acute respiratory distress.  History is limited due to patient condition. En route with EMS patient was noted to have rales in all lung fields and placed on CPAP with O2 saturations noted to be in the 80s.  She was admitted for acute on chronic hypoxic respiratory failure due to acute on chronic CHF.   Subjective:    [name] today has, No headache, No chest pain, No abdominal pain - No Nausea, No new weakness tingling or numbness, No Cough - states she is constipated and last BM was over 5 days ago, shortness of breath is improved.   Assessment  & Plan :     1.  Acute on chronic hypoxic respiratory failure due to acute on chronic combined systolic and diastolic CHF EF 35 to 40%.  Patient chronically on home oxygen, has been placed on IV Lasix, will give her a dose of Zaroxolyn which she takes as needed at home, initiate Coreg from tomorrow, resume home dose imdur, if blood pressure allows will add hydralazine, continue supplemental oxygen,  monitor intake output, weight and BMP closely.  No ACE/ARB due to renal insufficiency.  2.  ARF on CKD 4.  Baseline creatinine around 1.6.  Monitor with diuretics, avoid nephrotoxins.  3.  Paroxysmal atrial fibrillation.  [place] vas 2 score of at least 4.  Continue amiodarone, resume Coreg from tomorrow, pharmacy monitoring Coumadin dose.  4.  Dyslipidemia.  On statin continue.  5.  Hypothyroidism.  On Synthroid.  TSH stable.  6.  Sinus syndrome.  Status post  pacemaker placement.  7.  Chronic constipation.  Last BM 5 days prior to the day of admission.  Continue home medication Linzess, added MiraLAX and Dulcolax suppository.  8.  Gout.  Stable continue home dose allopurinol.  9.  Chronic back pain and peripheral neuropathy.  Continue low-dose home narcotic regimen along with Neurontin.  Monitor with caution.  10.  Single mildly elevated troponin.  Likely due to #1 above, will trend.  Check echocardiogram to evaluate wall motion and EF, continue combination of Coumadin, beta-blocker and statin for secondary prevention.  No chest pain.  Cardiology on board.    Diet :  Diet Order           Diet clear liquid Room service appropriate? Yes; Fluid consistency: Thin  Diet effective now            Family Communication  : None present  Code Status : Full  Disposition Plan  : Stepdown  Consults  : Cardiology  Procedures  :    Echocardiogram ordered and pending  DVT Prophylaxis  : Coumadin  Lab Results  Component Value Date   INR 2.12 11/07/2017   INR 2.53 07/30/2016   INR 2.36 07/29/2016     Lab Results  Component Value Date   PLT 262 11/07/2017    Inpatient Medications  Scheduled Meds: . allopurinol  100 mg Oral Daily  . amiodarone  100 mg Oral Daily  . atorvastatin  10 mg Oral q1800  . bisacodyl  10 mg Rectal Daily  . calcitRIOL  0.25 mcg Oral Daily  . cycloSPORINE  1 drop Both Eyes BID  . donepezil  10 mg Oral QHS  . DULoxetine  60 mg Oral q1800  . ferrous  sulfate  325 mg Oral Q breakfast  . furosemide  80 mg Intravenous BID  . gabapentin  200 mg Oral BID  . levothyroxine  100 mcg Oral QAC breakfast  . linaclotide  145 mcg Oral Daily  . Melatonin  3 mg Oral QHS  . MUSCLE RUB  1 application Topical BID  . pantoprazole  40 mg Oral Daily  . polyethylene glycol  17 g Oral BID  . potassium chloride  20 mEq Oral Daily  . senna  1 tablet Oral BID  . sodium chloride  1 spray Each Nare TID  . traZODone  50 mg Oral QHS  . warfarin  2 mg Oral q1800  . Warfarin - Pharmacist Dosing Inpatient   Does not apply q1800   Continuous Infusions: PRN Meds:.acetaminophen, HYDROcodone-acetaminophen, ipratropium-albuterol, ondansetron (ZOFRAN) IV  Antibiotics  :    Anti-infectives (From admission, onward)   None         Objective:   Vitals:   11/07/17 0800 11/07/17 0846 11/07/17 0900 11/07/17 1000  BP: (!) 116/51 (!) 118/58 128/62 (!) 117/58  Pulse: 79 85 80 73  Resp: 20 20 (!) 24 18  Temp:   98.7 F (37.1 C)   TempSrc:   Axillary   SpO2: 98% 97% 97% 100%  Weight:      Height:        Wt Readings from Last 3 Encounters:  11/07/17 77.5 kg (170 lb 13.7 oz)  10/26/17 72.1 kg (159 lb)  01/21/17 74.8 kg (165 lb)    No intake or output data in the 24 hours ending 11/07/17 1118   Physical Exam  Awake Alert, No new F.N deficits, Normal affect [place].AT,PERRAL Supple Neck,No JVD, No cervical lymphadenopathy appriciated.  Symmetrical Chest wall movement, Good  air movement bilaterally, bibasilar Rales RRR,No Gallops,Rubs or new Murmurs, No Parasternal Heave +ve B.Sounds, Abd Soft, No tenderness, No organomegaly appriciated, No rebound - guarding or rigidity. No Cyanosis, Clubbing or edema, No new Rash or bruise      Data Review:    CBC Recent Labs  Lab 11/07/17 0217 11/07/17 0232 11/07/17 0409  WBC 14.6*  --   --   HGB 13.4 14.3 12.9  HCT 41.3 42.0 38.0  PLT 262  --   --   MCV 101.2*  --   --   MCH 32.8  --   --   MCHC 32.4  --   --    RDW 14.1  --   --   LYMPHSABS 2.8  --   --   MONOABS 0.5  --   --   EOSABS 0.2  --   --   BASOSABS 0.1  --   --     Chemistries  Recent Labs  Lab 11/07/17 0217 11/07/17 0232 11/07/17 0409 11/07/17 0418  NA 135 132* 131*  --   K 5.1 5.0 7.9* 3.8  CL 97* 99 98  --   CO2 25  --   --   --   GLUCOSE 258* 254* 129*  --   BUN 41* 64* 71*  --   CREATININE 2.15* 2.10* 2.10*  --   CALCIUM 9.2  --   --   --   MG  --   --   --  1.8   ------------------------------------------------------------------------------------------------------------------ No results for input(s): CHOL, HDL, LDLCALC, TRIG, CHOLHDL, LDLDIRECT in the last 72 hours.  Lab Results  Component Value Date   HGBA1C 5.0 06/14/2016   ------------------------------------------------------------------------------------------------------------------ Recent Labs    11/07/17 0635  TSH 1.505   ------------------------------------------------------------------------------------------------------------------ No results for input(s): VITAMINB12, FOLATE, FERRITIN, TIBC, IRON, RETICCTPCT in the last 72 hours.  Coagulation profile Recent Labs  Lab 11/07/17 0635  INR 2.12    No results for input(s): DDIMER in the last 72 hours.  Cardiac Enzymes No results for input(s): CKMB, TROPONINI, MYOGLOBIN in the last 168 hours.  Invalid input(s): CK ------------------------------------------------------------------------------------------------------------------    Component Value Date/Time   BNP 491.3 (H) 11/07/2017 7846    Micro Results No results found for this or any previous visit (from the past 240 hour(s)).  Radiology Reports Ct Chest High Resolution  Result Date: 11/04/2017 CLINICAL DATA:  3+ months of wheezing, coughing and hemoptysis. EXAM: CT CHEST WITHOUT CONTRAST TECHNIQUE: Multidetector CT imaging of the chest was performed following the standard protocol without intravenous contrast. High resolution  imaging of the lungs, as well as inspiratory and expiratory imaging, was performed. COMPARISON:  12/14/2014. FINDINGS: Cardiovascular: Atherosclerotic calcification of the arterial vasculature, including extensive three-vessel involvement of the coronary arteries. Aortic valve replacement. Heart is enlarged. No pericardial effusion. Mediastinum/Nodes: Mediastinal lymph nodes measure up to 9 mm in the low right paratracheal station, as before. Hilar regions are difficult to evaluate without IV contrast. No axillary adenopathy. Esophagus is grossly unremarkable. Moderate hiatal hernia. Lungs/Pleura: Image quality is degraded by respiratory motion and expiratory phase imaging. Scarring in the right middle lobe, lingula and both lower lobes. No definitive subpleural reticulation, traction bronchiectasis/bronchiolectasis, ground-glass, architectural distortion or honeycombing. No pleural fluid. Airway is unremarkable. No air trapping. Upper Abdomen: Visualized portion of the liver is unremarkable. A stone is seen in the gallbladder. Visualized portions of the adrenal glands are unremarkable. Partially imaged low-attenuation lesions in the kidneys bilaterally. Visualized portions of the spleen, pancreas,  stomach and bowel are grossly unremarkable with exception of a moderate hiatal hernia. Upper abdominal lymph nodes are not enlarged by CT size criteria. Musculoskeletal: Degenerative changes in the spine. No worrisome lytic or sclerotic lesions. IMPRESSION: 1. Image quality is degraded by respiratory motion and expiratory phase imaging. No definitive evidence of interstitial lung disease. 2. Aortic atherosclerosis (ICD10-170.0). Extensive three-vessel coronary artery calcification. 3. Moderate hiatal hernia. 4. Cholelithiasis. Electronically Signed   By: Leanna Battles M.D.   On: 11/04/2017 07:49   Dg Chest Portable 1 View  Result Date: 11/07/2017 CLINICAL DATA:  84 year old female with respiratory distress. EXAM:  PORTABLE CHEST 1 VIEW COMPARISON:  Chest CT dated 11/03/2017 FINDINGS: There is cardiomegaly with vascular congestion and mild edema. Small left pleural effusion and left lung base atelectasis. Pneumonia is not excluded. There is no pneumothorax. Right pectoral pacemaker device and mechanical aortic valve. No acute osseous pathology. IMPRESSION: Cardiomegaly with findings of CHF and small left pleural effusion. Pneumonia is not excluded. Clinical correlation is recommended. Electronically Signed   By: Elgie Collard M.D.   On: 11/07/2017 02:32    Time Spent in minutes  30   Susa Raring M.D on 11/07/2017 at 11:18 AM  To page go to [email or web address] - password [facility]

## 2017-11-07 NOTE — H&P (Signed)
History and Physical    [name]:[id number] DOB: 1932/07/22 DOA: 11/07/2017  Referring MD/NP/PA: Glynn Octave, MD PCP: Pearson Grippe, MD  Patient coming from: Nursing facility via EMS  Chief Complaint: Shortness of breath  I have personally briefly reviewed patient's old medical records in [facility]   HPI: [name] is a 84 y.o. female with medical history significant of HTN, HLD, oxygen dependent chronic respiratory failure, CKD, A. fib, chronic combined systolic and diastolic CHF last EF 35-40% with grade 2 DD., fibromyalgia, dementia, nonischemic cardiomyopathy, DVT/PE, symptomatic bradycardia s/p PM placement, CVA, OSA, hypothyroidism; who presented from her nursing facility with acute respiratory distress.  History is limited due to patient condition. En route with EMS patient was noted to have rales in all lung fields and placed on CPAP with O2 saturations noted to be in the 80s.  ED Course: Upon admission to the emergency department patient was noted to be afebrile.  Pulse 83-106, respirations 19-27, blood pressure 104/57-158/66, and O2 saturations 98 - 100% after being switched to BiPAP.  Labs revealed WBC 14.6, BUN 41, creatinine 2.15, BNP 191.3, lactic acid 1.56, and troponin 0.16.  Chest x-ray showed cardiomegaly with signs of CHF with small left pleural effusion.  Patient was placed on nitroglycerin drip and given 80 mg of Lasix IV.  TRH called to admit.  PT/INR pending.  Review of Systems  Unable to perform ROS: Severe respiratory distress    Past Medical History:  Diagnosis Date  . Acute on chronic renal failure [facility])    sees Dr Allena Katz   . Anemia    Acute blood loss  . Aortic regurgitation   . Aortic stenosis 10/13/2012   Low EF, low gradient with severe aortic stenosis confirmed by dobutamine stress echocardiogram s/p TAVR 12/2012  . Asthma   . Atrial fibrillation (HCC)    tachy-brady syndrome with <1% recurrent PAF since pacemaker placement  .  Cataracts, bilateral   . Chronic combined systolic and diastolic CHF (congestive heart failure) (HCC)   . Chronic lower back pain   . CKD (chronic kidney disease)   . Coronary artery disease involving native coronary artery of native heart   . Dementia    Without behavioral disturbance  . Dysrhythmia   . Fibromyalgia   . Gastroesophageal reflux disease   . H/O dizziness   . H/O urinary frequency   . H/O: stroke   . Hard of hearing   . Headache    hx of migraines   . Heart murmur   . History of blood transfusion   . History of bronchitis   . History of kidney stones   . History of urinary tract infection   . HLD (hyperlipidemia)   . HTN (hypertension)   . Hypothyroidism   . Neuropathy   . Nonischemic cardiomyopathy (HCC)   . On home oxygen therapy    patient uses at nite- 2L- has not used in > 6 months per patient  . Osteoarthritis   . Osteoporosis   . Pneumonia    hx of x 3   . PONV (postoperative nausea and vomiting)   . Presence of permanent cardiac pacemaker   . Pulmonary embolism (HCC)    HISTORY OF, the pt. had a recurrent bilateral pulmonary emboli in 2005, on warfarin therapy and at which time she under went implantation of IVC filter  . Repeated falls   . Rupture of right patellar tendon   . Sciatica   . Shortness  of breath dyspnea    with exertion   . Sleep apnea    uses oxygen at night and PRN- not used since > 6 months / DOES NOT USE  C-PAP  . Spinal stenosis   . Stroke (HCC)   . Symptomatic bradycardia   . Symptomatic bradycardia 2012   s/p Medtronic PPM  . Syncope   . Urinary incontinence   . Urinary urgency     Past Surgical History:  Procedure Laterality Date  . ABDOMINAL HYSTERECTOMY    . APPENDECTOMY    . BACK SURGERY    . BUNIONECTOMY    . CARDIAC CATHETERIZATION    . CATARACT EXTRACTION    . CENTRAL VENOUS CATHETER INSERTION Left 12/21/2012   Procedure: INSERTION CENTRAL LINE ADULT;  Surgeon: Tonny Bollman, MD;  Location[facility] OR;   Service: Open Heart Surgery;  Laterality: Left;  . ELBOW SURGERY     bilat   . INTRAOPERATIVE TRANSESOPHAGEAL ECHOCARDIOGRAM N/A 12/21/2012   Procedure: INTRAOPERATIVE TRANSESOPHAGEAL ECHOCARDIOGRAM;  Surgeon: Tonny Bollman, MD;  Location: [facility] OR;  Service: Open Heart Surgery;  Laterality: N/A;  . KNEE SURGERY Left   . LEFT AND RIGHT HEART CATHETERIZATION WITH CORONARY ANGIOGRAM N/A 10/18/2012   Procedure: LEFT AND RIGHT HEART CATHETERIZATION WITH CORONARY ANGIOGRAM;  Surgeon: Tonny Bollman, MD;  Location: [facility] CATH LAB;  Service: Cardiovascular;  Laterality: N/A;  . NASAL SEPTUM SURGERY    . NOSE SURGERY     X 2  . ORIF PATELLA Right 03/08/2015   Procedure:  OPEN REDUCTION INTERNAL FIXATION RIGHT  PATELLA TENDON AVULSION;  Surgeon: Durene Romans, MD;  Location: [facility] ORS;  Service: Orthopedics;  Laterality: Right;  . PACEMAKER INSERTION     Medtronic  . PATELLAR TENDON REPAIR Right 06/25/2015   Procedure: RIGHT PATELLA TENDON REVISION/REPAIR;  Surgeon: Durene Romans, MD;  Location: [facility] ORS;  Service: Orthopedics;  Laterality: Right;  . SHOULDER SURGERY     bilat   . THYROIDECTOMY, PARTIAL    . TONSILLECTOMY    . TOTAL KNEE ARTHROPLASTY Right 12/04/2014   Procedure: RIGHT TOTAL  KNEE ARTHROPLASTY;  Surgeon: Durene Romans, MD;  Location: [facility] ORS;  Service: Orthopedics;  Laterality: Right;  . TRANSCATHETER AORTIC VALVE REPLACEMENT, TRANSFEMORAL  12/21/2012   a. 29mm Edwards Sapien XT transcatheter heart valve placed via open left transfemoral approach b. Intra-op TEE: well-seated bioprosthetic aortic valve with mean gradient 2 mmHg, trivial AI, mild MR, EF 30-35%  . TRANSCATHETER AORTIC VALVE REPLACEMENT, TRANSFEMORAL N/A 12/21/2012   Procedure: TRANSCATHETER AORTIC VALVE REPLACEMENT, TRANSFEMORAL;  Surgeon: Tonny Bollman, MD;  Location: [facility] OR;  Service: Open Heart Surgery;  Laterality: N/A;     reports that she has never smoked. She has never used smokeless tobacco. She reports that she does not drink  alcohol or use drugs.  Allergies  Allergen Reactions  . Nubain [Nalbuphine Hcl] Hives    Went into cardiac arrest   . Codeine Hives and Nausea Only    Has tolerated Oxycodone  . Darvocet [Propoxyphene N-Acetaminophen] Nausea Only    Family History  Problem Relation Age of Onset  . Ovarian cancer Mother        Deceased  . Epilepsy Father        Deceased    Prior to Admission medications   Medication Sig Start Date End Date Taking? Authorizing Provider  acetaminophen (TYLENOL) 325 MG tablet Take 650 mg by mouth every 4 (four) hours as needed for mild pain.    [provider]  alendronate (FOSAMAX) 70 MG/75ML solution Take 70 mg by mouth every Saturday. Take on an empty stomach with a full glass of fluids, remain upright 30 minutes after administration - for bone health. Give 75 mL    [provider]  amiodarone (PACERONE) 100 MG tablet Take 100 mg by mouth daily.    [provider]  atorvastatin (LIPITOR) 10 MG tablet Take 10 mg by mouth daily at 6 PM.     [provider]  Biotin 5000 MCG CAPS Take 5,000 mcg by mouth daily at 6 PM.    [provider]  bisacodyl (DULCOLAX) 10 MG suppository Place 10 mg rectally daily as needed (constipation).     [provider]  calcitRIOL (ROCALTROL) 0.25 MCG capsule Take 0.25 mcg by mouth daily.     [provider]  camphor-menthol Wynelle Fanny) lotion Apply 1 application topically as needed for itching.    [provider]  carvedilol (COREG) 3.125 MG tablet Take 1 tablet (3.125 mg total) by mouth 2 (two) times daily with a meal. 07/17/16   Penny Pia, MD  cholecalciferol (VITAMIN D) 1000 units tablet Take 2,000 Units by mouth at bedtime.    [provider]  cycloSPORINE (RESTASIS) 0.05 % ophthalmic emulsion Place 1 drop into both eyes 2 (two) times daily.     [provider]  donepezil (ARICEPT) 10 MG tablet Take 10 mg by mouth at bedtime.     [provider]  DULoxetine (CYMBALTA) 60 MG capsule Take 60 mg by mouth daily at 6 PM.     [provider]  esomeprazole (NEXIUM) 40 MG capsule Take 40 mg by mouth daily.     [provider]  febuxostat (ULORIC) 40 MG tablet Take 20 mg by mouth daily at 6 PM.     [provider]  ferrous sulfate 325 (65 FE) MG tablet Take 325 mg by mouth daily.     [provider]  hydrALAZINE (APRESOLINE) 10 MG tablet Take 1 tablet (10 mg total) by mouth 3 (three) times daily. 07/17/16   Penny Pia, MD  HYDROcodone-acetaminophen (NORCO/VICODIN) 5-325 MG tablet Take 1 tablet by mouth 2 (two) times daily as needed (Pain - hold for sedation).    [provider]  ipratropium-albuterol (DUONEB) 0.5-2.5 (3) MG/3ML SOLN Take 3 mLs by nebulization every 6 (six) hours as needed (wheezing/shortness of breath).    [provider]  isosorbide mononitrate (IMDUR) 30 MG 24 hr tablet Take 1 tablet (30 mg total) by mouth daily. 08/05/16 11/04/16  Manson Passey, PA  levothyroxine (SYNTHROID, LEVOTHROID) 112 MCG tablet Take 112 mcg by mouth daily.     [provider]  Linaclotide Karlene Einstein) 145 MCG CAPS capsule Take 145 mcg by mouth daily.     [provider]  memantine (NAMENDA) 5 MG tablet Take 5 mg by mouth 2 (two) times daily.     [provider]  metolazone (ZAROXOLYN) 2.5 MG tablet Take one tablet by mouth 30 minutes prior to morning dosage of Torsemide if dry weight is 5 lb greater than 168 lbs.  No more than twice per week. 10/08/16   Tonny Bollman, MD  Multiple Vitamin (MULTIVITAMIN WITH MINERALS) TABS tablet Take 1 tablet by mouth daily.    [provider]  multivitamin (RENA-VIT) TABS tablet Take 1 tablet by mouth daily.     [provider]  nitroGLYCERIN (NITROSTAT) 0.4 MG SL tablet Place 0.4 mg under the tongue every 5 (five) minutes as needed  for chest pain (MAX 3 TABLETS).     [provider]  polyethylene glycol  (MIRALAX / GLYCOLAX) packet Take 17 g by mouth 2 (two) times daily. 07/30/16   Rolly Salter, MD  potassium chloride (KLOR-CON) 20 MEQ packet Take 20 mEq by mouth daily.     [provider]  pramoxine (SARNA SENSITIVE) 1 % LOTN Apply 1 application topically as needed (itching).     [provider]  pregabalin (LYRICA) 25 MG capsule Take 25 mg by mouth 2 (two) times daily. 6AM and 6PM    [provider]  senna-docusate (SENOKOT-S) 8.6-50 MG tablet Take 1 tablet by mouth 2 (two) times daily. 07/30/16   Rolly Salter, MD  simethicone (MYLICON) 80 MG chewable tablet Chew 80 mg by mouth every 12 (twelve) hours as needed for flatulence.     [provider]  torsemide (DEMADEX) 20 MG tablet Take 40 mg by mouth daily.     [provider]  vitamin B-12 (CYANOCOBALAMIN) 1000 MCG tablet Take 1,000 mcg by mouth 2 (two) times a week. Tuesday and Friday    [provider]  warfarin (COUMADIN) 2 MG tablet Take 2 mg by mouth daily.    [provider]    Physical Exam:  Constitutional: Elderly female currently on BiPAP able to follow commands. Vitals:   11/07/17 0300 11/07/17 0315 11/07/17 0330 11/07/17 0349  BP: 118/63 (!) 114/55 (!) 105/55 (!) 104/57  Pulse: 88 84 83 91  Resp: (!) 21 19 19  (!) 24  Temp:      TempSrc:      SpO2: 100% 100% 100% 100%  Weight:      Height:       Eyes: PERRL, lids and conjunctivae normal ENMT: Mucous membranes are moist. Posterior pharynx clear of any exudate or lesions. Neck: normal, supple.  Positive signs of JVD. Respiratory: Decreased aeration noted with mild rails.  Patient coughing intermittently. Cardiovascular: Regular rate and rhythm, positive systolic murmur. No rubs / gallops. +2 pitting bilateral lower extremity edema. 2+ pedal pulses. No carotid bruits.  Abdomen: no tenderness, no masses palpated. No hepatosplenomegaly. Bowel sounds positive.  Musculoskeletal: no clubbing / cyanosis. No joint  deformity upper and lower extremities. Good ROM, no contractures. Normal muscle tone.  Skin: no rashes, lesions, ulcers. No induration Neurologic: CN 2-12 grossly intact. Sensation intact, DTR normal. Strength 5/5 in all 4.  Psychiatric: Alert, but unable to assess orientation at this time due to BiPAP.    Labs on Admission: I have personally reviewed following labs and imaging studies  CBC: Recent Labs  Lab 11/07/17 0217 11/07/17 0232  WBC 14.6*  --   NEUTROABS 11.0*  --   HGB 13.4 14.3  HCT 41.3 42.0  MCV 101.2*  --   PLT 262  --    Basic Metabolic Panel: Recent Labs  Lab 11/07/17 0217 11/07/17 0232  NA 135 132*  K 5.1 5.0  CL 97* 99  CO2 25  --   GLUCOSE 258* 254*  BUN 41* 64*  CREATININE 2.15* 2.10*  CALCIUM 9.2  --    GFR: Estimated Creatinine Clearance: 16.7 mL/min (A) (by C-G formula based on SCr of 2.1 mg/dL (H)). Liver Function Tests: No results for input(s): AST, ALT, ALKPHOS, BILITOT, PROT, ALBUMIN in the last 168 hours. No results for input(s): LIPASE, AMYLASE in the last 168 hours. No results for input(s): AMMONIA in the last 168 hours. Coagulation Profile: No results for input(s): INR, PROTIME in  the last 168 hours. Cardiac Enzymes: No results for input(s): CKTOTAL, CKMB, CKMBINDEX, TROPONINI in the last 168 hours. BNP (last 3 results) No results for input(s): PROBNP in the last 8760 hours. HbA1C: No results for input(s): HGBA1C in the last 72 hours. CBG: No results for input(s): GLUCAP in the last 168 hours. Lipid Profile: No results for input(s): CHOL, HDL, LDLCALC, TRIG, CHOLHDL, LDLDIRECT in the last 72 hours. Thyroid Function Tests: No results for input(s): TSH, T4TOTAL, FREET4, T3FREE, THYROIDAB in the last 72 hours. Anemia Panel: No results for input(s): VITAMINB12, FOLATE, FERRITIN, TIBC, IRON, RETICCTPCT in the last 72 hours. Urine analysis:    Component Value Date/Time   COLORURINE YELLOW 07/15/2016 0914   APPEARANCEUR CLEAR  07/15/2016 0914   LABSPEC 1.009 07/15/2016 0914   PHURINE 5.0 07/15/2016 0914   GLUCOSEU NEGATIVE 07/15/2016 0914   HGBUR NEGATIVE 07/15/2016 0914   BILIRUBINUR NEGATIVE 07/15/2016 0914   KETONESUR NEGATIVE 07/15/2016 0914   PROTEINUR NEGATIVE 07/15/2016 0914   UROBILINOGEN 0.2 11/29/2014 1411   NITRITE NEGATIVE 07/15/2016 0914   LEUKOCYTESUR NEGATIVE 07/15/2016 0914   Sepsis Labs: No results found for this or any previous visit (from the past 240 hour(s)).   Radiological Exams on Admission: Dg Chest Portable 1 View  Result Date: 11/07/2017 CLINICAL DATA:  84 year old female with respiratory distress. EXAM: PORTABLE CHEST 1 VIEW COMPARISON:  Chest CT dated 11/03/2017 FINDINGS: There is cardiomegaly with vascular congestion and mild edema. Small left pleural effusion and left lung base atelectasis. Pneumonia is not excluded. There is no pneumothorax. Right pectoral pacemaker device and mechanical aortic valve. No acute osseous pathology. IMPRESSION: Cardiomegaly with findings of CHF and small left pleural effusion. Pneumonia is not excluded. Clinical correlation is recommended. Electronically Signed   By: Elgie Collard M.D.   On: 11/07/2017 02:32    EKG: Independently reviewed.  Sinus tachycardia 107 bpm with lateral Q waves noted  Assessment/Plan Acute on chronic respiratory failure with hypoxia 2/2 acute on chronic combined systolic and diastolic CHF exacerbation with pulmonary edema: Patient present with complaints of shortness of breath with respiratory distress found to be hypoxic into the 80s.  Chest x-ray showing signs of CHF.  BNP elevated at 491.3.  Last EF noted to be 35 - 40% with grade 2 diastolic dysfunction in 05/2016..  Initiallygiven 80 mg of Lasix IV and placed on nitroglycerin drip.  Patient followed in outpatient setting by Dr. Ladona Ridgel. - Admit to a stepdown bed - Heart failure orders set  initiated  - Continuous pulse oximetry with nasal cannula oxygen as needed to keep  O2 saturations >92% - BiPAP as needed - Strict I&Os and daily weights - Elevate lower extremities - Lasix 80 mg IV Bid - Reassess in a.m. and adjust diuresis as needed. - Check echocardiogram - DuoNeb's as needed - Cardiology Dr. Deforest Hoyles consulted, and will follow the patient  Elevated troponin: Acute.  Initial troponin elevated at 0.16.  Suspect likely related to acute demand. - Trend troponins - Cardiology consulted and will follow  Acute kidney injury on chronic kidney disease stage III: Patient presents with a creatinine of 2.15 with BUN elevated at 41.  Her baseline creatinine previously had been around 1.6-1.8.  Suspect function to improve somewhat diuresis. - Continue to monitor kidney function  Essential hypertension - Hold hydralazine and isosorbide mononitrate while on IV nitroglycerin - Continue Coreg     Paroxysmal atrial fibrillation s/p PM - Follow-up PT/INR - Continue amiodarone and Coumadin per pharmacy  S/p TAVR  Hypothyroidism - Add -on TSH - Continue levothyroxine   History of DVT/PE - Continue warfarin  Dementia without behavior disturbance - Continue Aricept  DVT prophylaxis: Coumadin Code Status: Full Family Communication: No family present at bedside Disposition Plan: TBD Consults called: Cards  Admission status: inpatient  Clydie Braun MD [facility] Pager [phone]   If 7PM-7AM, please contact night-coverage [email or web address] Password [email or web address]  11/07/2017, 4:02 AM

## 2017-11-07 NOTE — Progress Notes (Signed)
Pt transported from ED to 2H22 without incidence.

## 2017-11-07 NOTE — Progress Notes (Signed)
Pt taken off Bipap per MD request and placed on [place] 2 L.  Pt tolerating well at this time.

## 2017-11-07 NOTE — Progress Notes (Signed)
Received [name] from 2 H at [facility].  Patient is awake, alert and oriented x 4.  Pt is HOH.  Placed on cardiac monitor, call placed to CCMD to register patient.   VSS.  BBS clear through out.  Heart rhythm is NSR.  Abdomen mildly distended with + BS through out.  Diffuse tenderness to palpitation.  CHG bath given.  Oriented to room and call light system.

## 2017-11-07 NOTE — Progress Notes (Signed)
ANTICOAGULATION CONSULT NOTE - Initial Consult  Pharmacy Consult for Warfarin  Indication: atrial fibrillation  Patient Measurements: Height: 4\' 9"  (144.8 cm) Weight: 160 lb (72.6 kg) IBW/kg (Calculated) : 38.6 Vital Signs: Temp: 97.8 F (36.6 C) (07/20 0223) Temp Source: Oral (07/20 0223) BP: 118/58 (07/20 0600) Pulse Rate: 81 (07/20 0600)  Labs: Recent Labs    11/07/17 0217 11/07/17 0232 11/07/17 0409  HGB 13.4 14.3 12.9  HCT 41.3 42.0 38.0  PLT 262  --   --   CREATININE 2.15* 2.10* 2.10*    Estimated Creatinine Clearance: 16.7 mL/min (A) (by C-G formula based on SCr of 2.1 mg/dL (H)).   Medical History: Past Medical History:  Diagnosis Date  . Acute on chronic renal failure [facility])    sees Dr Allena Katz   . Anemia    Acute blood loss  . Aortic regurgitation   . Aortic stenosis 10/13/2012   Low EF, low gradient with severe aortic stenosis confirmed by dobutamine stress echocardiogram s/p TAVR 12/2012  . Asthma   . Atrial fibrillation (HCC)    tachy-brady syndrome with <1% recurrent PAF since pacemaker placement  . Cataracts, bilateral   . Chronic combined systolic and diastolic CHF (congestive heart failure) (HCC)   . Chronic lower back pain   . CKD (chronic kidney disease)   . Coronary artery disease involving native coronary artery of native heart   . Dementia    Without behavioral disturbance  . Dysrhythmia   . Fibromyalgia   . Gastroesophageal reflux disease   . H/O dizziness   . H/O urinary frequency   . H/O: stroke   . Hard of hearing   . Headache    hx of migraines   . Heart murmur   . History of blood transfusion   . History of bronchitis   . History of kidney stones   . History of urinary tract infection   . HLD (hyperlipidemia)   . HTN (hypertension)   . Hypothyroidism   . Neuropathy   . Nonischemic cardiomyopathy (HCC)   . On home oxygen therapy    patient uses at nite- 2L- has not used in > 6 months per patient  . Osteoarthritis   .  Osteoporosis   . Pneumonia    hx of x 3   . PONV (postoperative nausea and vomiting)   . Presence of permanent cardiac pacemaker   . Pulmonary embolism (HCC)    HISTORY OF, the pt. had a recurrent bilateral pulmonary emboli in 2005, on warfarin therapy and at which time she under went implantation of IVC filter  . Repeated falls   . Rupture of right patellar tendon   . Sciatica   . Shortness of breath dyspnea    with exertion   . Sleep apnea    uses oxygen at night and PRN- not used since > 6 months / DOES NOT USE  C-PAP  . Spinal stenosis   . Stroke (HCC)   . Symptomatic bradycardia   . Symptomatic bradycardia 2012   s/p Medtronic PPM  . Syncope   . Urinary incontinence   . Urinary urgency     Assessment: 84 y/o F here with shortness of breath, on warfarin PTA for afib, also hx of PE, no INR yet  Goal of Therapy:  INR 2-3 Monitor platelets by anticoagulation protocol: Yes   Plan:  -INR pending to assess dosing needs  [name] 11/07/2017,6:50 AM

## 2017-11-07 NOTE — Consult Note (Signed)
[facility] Consult Note   Primary Physician: Pearson Grippe, MD Primary Cardiologist:  Dr. Excell Seltzer  Reason for Consultation: History of nonischemic cardiomyopathy, chronic systolic heart failure and status post TAVR on 12/21/2012 for severe AS  HPI:    [name] is an 84 y.o. female with a history of NICM, chronic systolic heart failure, and severe AS s/p TAVR 12/21/2012 with 29 mm Edwards Sapien XT valve, paroxysmalatrial fibrillation, HTN, CKD stage IV, oxygen dependent chronic respiratory failure, obstructive sleep apnea, dementia, remote DVT/PE, chronic anticoagulation with warfarin s/p PPM by Dr. Ladona Ridgel who presents with complaints of worsening shortness of breath at rest.  Despite the use of diuretics (torsemide and twice weekly metolazone) the patient has been noticing increasing wheezing along with coughing. A high resolution CT chest on 11/04/2017 did not reveal any interstitial lung disease. In the ED her chest x-ray showed vascular congestion and a small left pleural effusion. To improve her O2 saturation she was switched to a BiPAP. The WBC count was 14.6, creatinine 2.15, lactic acid 1.56 and troponin 0.16. She denied any chest pain. She was initially treated with IV nitroglycerin and a bolus of IV Lasix.  Last year she had multiple admissions for congestive heart failure.    Review of Systems  Constitutional: Negative for chills and fever.  Respiratory: Positive for shortness of breath.   Cardiovascular: Negative for chest pain.  Gastrointestinal: Negative for heartburn.  Genitourinary: Negative for frequency.  Skin: Negative for rash.  Endo/Heme/Allergies: Does not bruise/bleed easily.  All other systems reviewed and are negative.   Home Medications Prior to Admission medications   Medication Sig Start Date End Date Taking? Authorizing Provider  acetaminophen (TYLENOL) 325 MG tablet Take 650 mg by mouth every 4 (four) hours as needed for mild pain.    [provider]  alendronate (FOSAMAX) 70 MG/75ML solution Take 70 mg by mouth every Saturday. Take on an empty stomach with a full glass of fluids, remain upright 30 minutes after administration - for bone health. Give 75 mL    [provider]  amiodarone (PACERONE) 100 MG tablet Take 100 mg by mouth daily.    [provider]  atorvastatin (LIPITOR) 10 MG tablet Take 10 mg by mouth daily at 6 PM.     [provider]  Biotin 5000 MCG CAPS Take 5,000 mcg by mouth daily at 6 PM.    [provider]  bisacodyl (DULCOLAX) 10 MG suppository Place 10 mg rectally daily as needed (constipation).     [provider]  calcitRIOL (ROCALTROL) 0.25 MCG capsule Take 0.25 mcg by mouth daily.     [provider]  camphor-menthol Wynelle Fanny) lotion Apply 1 application topically as needed for itching.    [provider]  carvedilol (COREG) 3.125 MG tablet Take 1 tablet (3.125 mg total) by mouth 2 (two) times daily with a meal. 07/17/16   Penny Pia, MD  cholecalciferol (VITAMIN D) 1000 units tablet Take 2,000 Units by mouth at bedtime.    [provider]  cycloSPORINE (RESTASIS) 0.05 % ophthalmic emulsion Place 1 drop into both eyes 2 (two) times daily.     [provider]  donepezil (ARICEPT) 10 MG tablet Take 10 mg by mouth at bedtime.     [provider]  DULoxetine (CYMBALTA) 60 MG capsule Take 60 mg by mouth daily at 6 PM.     [provider]  esomeprazole (NEXIUM) 40 MG capsule Take 40 mg by  mouth daily.     [provider]  febuxostat (ULORIC) 40 MG tablet Take 20 mg by mouth daily at 6 PM.     [provider]  ferrous sulfate 325 (65 FE) MG tablet Take 325 mg by mouth daily.     [provider]  hydrALAZINE (APRESOLINE) 10 MG tablet Take 1 tablet (10 mg total) by mouth 3 (three) times daily. 07/17/16   Penny Pia, MD  HYDROcodone-acetaminophen (NORCO/VICODIN) 5-325 MG tablet Take 1  tablet by mouth 2 (two) times daily as needed (Pain - hold for sedation).    [provider]  ipratropium-albuterol (DUONEB) 0.5-2.5 (3) MG/3ML SOLN Take 3 mLs by nebulization every 6 (six) hours as needed (wheezing/shortness of breath).    [provider]  isosorbide mononitrate (IMDUR) 30 MG 24 hr tablet Take 1 tablet (30 mg total) by mouth daily. 08/05/16 11/04/16  Manson Passey, PA  levothyroxine (SYNTHROID, LEVOTHROID) 112 MCG tablet Take 112 mcg by mouth daily.     [provider]  Linaclotide Karlene Einstein) 145 MCG CAPS capsule Take 145 mcg by mouth daily.     [provider]  memantine (NAMENDA) 5 MG tablet Take 5 mg by mouth 2 (two) times daily.     [provider]  metolazone (ZAROXOLYN) 2.5 MG tablet Take one tablet by mouth 30 minutes prior to morning dosage of Torsemide if dry weight is 5 lb greater than 168 lbs.  No more than twice per week. 10/08/16   Tonny Bollman, MD  Multiple Vitamin (MULTIVITAMIN WITH MINERALS) TABS tablet Take 1 tablet by mouth daily.    [provider]  multivitamin (RENA-VIT) TABS tablet Take 1 tablet by mouth daily.     [provider]  nitroGLYCERIN (NITROSTAT) 0.4 MG SL tablet Place 0.4 mg under the tongue every 5 (five) minutes as needed for chest pain (MAX 3 TABLETS).     [provider]  polyethylene glycol (MIRALAX / GLYCOLAX) packet Take 17 g by mouth 2 (two) times daily. 07/30/16   Rolly Salter, MD  potassium chloride (KLOR-CON) 20 MEQ packet Take 20 mEq by mouth daily.     [provider]  pramoxine (SARNA SENSITIVE) 1 % LOTN Apply 1 application topically as needed (itching).     [provider]  pregabalin (LYRICA) 25 MG capsule Take 25 mg by mouth 2 (two) times daily. 6AM and 6PM    [provider]  senna-docusate (SENOKOT-S) 8.6-50 MG tablet Take 1 tablet by mouth 2 (two) times daily. 07/30/16   Rolly Salter, MD  simethicone (MYLICON) 80 MG chewable  tablet Chew 80 mg by mouth every 12 (twelve) hours as needed for flatulence.     [provider]  torsemide (DEMADEX) 20 MG tablet Take 40 mg by mouth daily.     [provider]  vitamin B-12 (CYANOCOBALAMIN) 1000 MCG tablet Take 1,000 mcg by mouth 2 (two) times a week. Tuesday and Friday    [provider]  warfarin (COUMADIN) 2 MG tablet Take 2 mg by mouth daily.    [provider]    Past Medical History: Past Medical History:  Diagnosis Date  . Acute on chronic renal failure [facility])    sees Dr Allena Katz   . Anemia    Acute blood loss  . Aortic regurgitation   . Aortic stenosis 10/13/2012   Low EF, low gradient with severe aortic stenosis confirmed by dobutamine stress echocardiogram s/p TAVR 12/2012  . Asthma   .  Atrial fibrillation (HCC)    tachy-brady syndrome with <1% recurrent PAF since pacemaker placement  . Cataracts, bilateral   . Chronic combined systolic and diastolic CHF (congestive heart failure) (HCC)   . Chronic lower back pain   . CKD (chronic kidney disease)   . Coronary artery disease involving native coronary artery of native heart   . Dementia    Without behavioral disturbance  . Dysrhythmia   . Fibromyalgia   . Gastroesophageal reflux disease   . H/O dizziness   . H/O urinary frequency   . H/O: stroke   . Hard of hearing   . Headache    hx of migraines   . Heart murmur   . History of blood transfusion   . History of bronchitis   . History of kidney stones   . History of urinary tract infection   . HLD (hyperlipidemia)   . HTN (hypertension)   . Hypothyroidism   . Neuropathy   . Nonischemic cardiomyopathy (HCC)   . On home oxygen therapy    patient uses at nite- 2L- has not used in > 6 months per patient  . Osteoarthritis   . Osteoporosis   . Pneumonia    hx of x 3   . PONV (postoperative nausea and vomiting)   . Presence of permanent cardiac pacemaker   . Pulmonary embolism (HCC)    HISTORY OF, the pt. had a  recurrent bilateral pulmonary emboli in 2005, on warfarin therapy and at which time she under went implantation of IVC filter  . Repeated falls   . Rupture of right patellar tendon   . Sciatica   . Shortness of breath dyspnea    with exertion   . Sleep apnea    uses oxygen at night and PRN- not used since > 6 months / DOES NOT USE  C-PAP  . Spinal stenosis   . Stroke (HCC)   . Symptomatic bradycardia   . Symptomatic bradycardia 2012   s/p Medtronic PPM  . Syncope   . Urinary incontinence   . Urinary urgency     Past Surgical History: Past Surgical History:  Procedure Laterality Date  . ABDOMINAL HYSTERECTOMY    . APPENDECTOMY    . BACK SURGERY    . BUNIONECTOMY    . CARDIAC CATHETERIZATION    . CATARACT EXTRACTION    . CENTRAL VENOUS CATHETER INSERTION Left 12/21/2012   Procedure: INSERTION CENTRAL LINE ADULT;  Surgeon: Tonny Bollman, MD;  Location: [facility] OR;  Service: Open Heart Surgery;  Laterality: Left;  . ELBOW SURGERY     bilat   . INTRAOPERATIVE TRANSESOPHAGEAL ECHOCARDIOGRAM N/A 12/21/2012   Procedure: INTRAOPERATIVE TRANSESOPHAGEAL ECHOCARDIOGRAM;  Surgeon: Tonny Bollman, MD;  Location[facility] OR;  Service: Open Heart Surgery;  Laterality: N/A;  . KNEE SURGERY Left   . LEFT AND RIGHT HEART CATHETERIZATION WITH CORONARY ANGIOGRAM N/A 10/18/2012   Procedure: LEFT AND RIGHT HEART CATHETERIZATION WITH CORONARY ANGIOGRAM;  Surgeon: Tonny Bollman, MD;  Location: [facility] CATH LAB;  Service: Cardiovascular;  Laterality: N/A;  . NASAL SEPTUM SURGERY    . NOSE SURGERY     X 2  . ORIF PATELLA Right 03/08/2015   Procedure:  OPEN REDUCTION INTERNAL FIXATION RIGHT  PATELLA TENDON AVULSION;  Surgeon: Durene Romans, MD;  Location: [facility] ORS;  Service: Orthopedics;  Laterality: Right;  . PACEMAKER INSERTION     Medtronic  . PATELLAR TENDON REPAIR Right 06/25/2015   Procedure: RIGHT PATELLA TENDON REVISION/REPAIR;  Surgeon: Durene Romans,  MD;  Location: [facility] ORS;  Service: Orthopedics;  Laterality:  Right;  . SHOULDER SURGERY     bilat   . THYROIDECTOMY, PARTIAL    . TONSILLECTOMY    . TOTAL KNEE ARTHROPLASTY Right 12/04/2014   Procedure: RIGHT TOTAL  KNEE ARTHROPLASTY;  Surgeon: Durene Romans, MD;  Location: [facility] ORS;  Service: Orthopedics;  Laterality: Right;  . TRANSCATHETER AORTIC VALVE REPLACEMENT, TRANSFEMORAL  12/21/2012   a. 29mm Edwards Sapien XT transcatheter heart valve placed via open left transfemoral approach b. Intra-op TEE: well-seated bioprosthetic aortic valve with mean gradient 2 mmHg, trivial AI, mild MR, EF 30-35%  . TRANSCATHETER AORTIC VALVE REPLACEMENT, TRANSFEMORAL N/A 12/21/2012   Procedure: TRANSCATHETER AORTIC VALVE REPLACEMENT, TRANSFEMORAL;  Surgeon: Tonny Bollman, MD;  Location: [facility] OR;  Service: Open Heart Surgery;  Laterality: N/A;    Family History: The patient's family history includes Epilepsy in her father; Ovarian cancer in her mother.  Family History  Problem Relation Age of Onset  . Ovarian cancer Mother        Deceased  . Epilepsy Father        Deceased    Social History: Social History   Socioeconomic History  . Marital status: Divorced    Spouse name: Not on file  . Number of children: Not on file  . Years of education: Not on file  . Highest education level: Not on file  Occupational History  . Not on file  Social Needs  . Financial resource strain: Not on file  . Food insecurity:    Worry: Not on file    Inability: Not on file  . Transportation needs:    Medical: Not on file    Non-medical: Not on file  Tobacco Use  . Smoking status: Never Smoker  . Smokeless tobacco: Never Used  Substance and Sexual Activity  . Alcohol use: No  . Drug use: No  . Sexual activity: Not Currently  Lifestyle  . Physical activity:    Days per week: Not on file    Minutes per session: Not on file  . Stress: Not on file  Relationships  . Social connections:    Talks on phone: Not on file    Gets together: Not on file    Attends religious  service: Not on file    Active member of club or organization: Not on file    Attends meetings of clubs or organizations: Not on file    Relationship status: Not on file  Other Topics Concern  . Not on file  Social History Narrative  . Not on file    Allergies  Allergen Reactions  . Nubain [Nalbuphine Hcl] Hives    Went into cardiac arrest   . Codeine Hives and Nausea Only    Has tolerated Oxycodone  . Darvocet [Propoxyphene N-Acetaminophen] Nausea Only    Objective:    Vital Signs:   Temp:  [97.8 F (36.6 C)] 97.8 F (36.6 C) (07/20 0223) Pulse Rate:  [83-106] 84 (07/20 0400) Resp:  [19-27] 20 (07/20 0400) BP: (104-158)/(52-92) 109/52 (07/20 0400) SpO2:  [98 %-100 %] 100 % (07/20 0400) FiO2 (%):  [100 %] 100 % (07/20 0220) Weight:  [72.6 kg (160 lb)] 72.6 kg (160 lb) (07/20 0220)    Weight change: Filed Weights   11/07/17 0220  Weight: 72.6 kg (160 lb)    Physical Exam    General:  Mild tachypnea, mild distress, diaphoretic HEENT: normal, Mucous membranes are moist. Neck: supple. JVP . Carotids  2+ bilat; no bruits. No lymphadenopathy or thyromegaly appreciated. Cor: PMI nondisplaced. Regular rate & rhythm. Unable to appreciate a murmur. Lungs: diffuse rales bilaterally Abdomen: soft, nontender, nondistended. No hepatosplenomegaly. No bruits or masses. Good bowel sounds. Extremities: lower extremity edema is present Neuro: alert & awake, moves all 4 extremities w/o difficulty.    EKG    11/07/2017 - Rate 107 bpm, sinus tachycardia, nonspecific T-wave abnormality, nonspecific intraventricular conduction delay  Labs   Basic Metabolic Panel: Recent Labs  Lab 11/07/17 0217 11/07/17 0232 11/07/17 0409  NA 135 132* 131*  K 5.1 5.0 7.9*  CL 97* 99 98  CO2 25  --   --   GLUCOSE 258* 254* 129*  BUN 41* 64* 71*  CREATININE 2.15* 2.10* 2.10*  CALCIUM 9.2  --   --     CBC: Recent Labs  Lab 11/07/17 0217 11/07/17 0232 11/07/17 0409  WBC 14.6*  --   --    NEUTROABS 11.0*  --   --   HGB 13.4 14.3 12.9  HCT 41.3 42.0 38.0  MCV 101.2*  --   --   PLT 262  --   --     Cardiac Enzymes: Troponin I (02:30 on 11/07/2017) - 0.16   BNP (last 3 results) Recent Labs    11/07/17 0219  BNP 491.3*    06/15/2016: TSH 2.673 07/21/2016: B Natriuretic Peptide 1,636.9 07/24/2016: ALT 14 08/05/2016: Hemoglobin 10.5; Platelets 282 08/26/2016: BUN 49; Creatinine 1.9; Potassium 4.7; Sodium 143     Imaging/Recent studies   Result Date: 11/07/2017 EXAM: PORTABLE CHEST 1 VIEW CLINICAL DATA:  84 year old female with respiratory distress.   FINDINGS: There is cardiomegaly with vascular congestion and mild edema. Small left pleural effusion and left lung base atelectasis. Pneumonia is not excluded. There is no pneumothorax. Right pectoral pacemaker device and mechanical aortic valve. No acute osseous pathology. IMPRESSION: Cardiomegaly with findings of CHF and small left pleural effusion. Pneumonia is not excluded.   Echocardiogram: 06/13/16 Study Conclusions  - Left ventricle: LVEF is approximately 35 to 40% with hypokinesis of inferior, inferoseptal and anterior walls This is new compared to echo of 2016. The cavity size was moderately dilated. Wall thickness was normal. Features are consistent with a pseudonormal left ventricular filling pattern, with concomitant abnormal relaxation and increased filling pressure (grade 2 diastolic dysfunction). Doppler parameters are consistent with high ventricular filling pressure. - Aortic valve: AV prosthesis is difficult to see Peak and mean gradients through the valve are 12 and 7 mm Hg respectively. There is trace valvular AI Valve area (VTI): 1.82 cm^2. Valve area (Vmax): 1.7 cm^2. Valve area (Vmean): 1.89 cm^2. - Mitral valve: There was mild to moderate regurgitation. - Left atrium: The atrium was mildly dilated. - Pulmonary arteries: PA peak pressure: 39 mm Hg (S).   PFTs  12/03/2012 FVC 2 [101%], FEV1 1.46 [100%], F/F 73, TLC 9 9%, DLCO 52%, DLCO/VA 70% Moderate-severe diffusion defect      Assessment/Plan    1. Acute pulmonary edema (acute on chronic systolic heart failure) Clinical examination and chest x-ray confirm pulmonary edema. The troponin is mildly elevated at 0.16. Her presentation is consistent with CHF exacerbation. It is difficult to ascertain a clear precipitating factor. The patient has difficulty providing a history since she is very short of breath. The BNP today is 491.3. Her extremities are warm to touch.  - Agree with IV nitroglycerin for afterload reduction - Give bolus of IV Lasix and monitor urine output, serum creatinine and  GFR.  - To improve oxygenation consider BiPAP - Trend cardiac biomarkers and obtain serial EKGs - initial troponin is 0.16 likely due to demand ischemia. - Obtain a transthoracic echocardiogram to evaluate LV systolic function and to visualize the aortic valve. - Continue amiodarone 100 mg daily - Continue carvedilol at 3.125 mg twice daily - Continue atorvastatin 10 mg nightly - Hold hydralazine while on IV nitroglycerin (systolic blood pressure currently is between 90-110 mmHg) - Hold isosorbide mononitrate while on IV nitroglycerin - If urine output is not adequate with IV Lasix then consider potentiating its effect with metolazone. - Continue warfarin - Strict I&O's and daily weights   2. Severe aortic stenosis status post TAVR in 2014 Her last echocardiogram in February 2018 head revealed a well-seated aortic valve prosthesis. The gradients across the valve were within normal limits. There was no significant valvular stenosis. The LVEF was 35-40%.   3. Paroxysmal atrial fibrillation status post permanent pacemaker placement Her current rhythm is sinus tachycardia. We'll plan to continue her amiodarone and warfarin. Her Medtronic DDD pacemaker was working normally on the last interrogation. Check an  INR.   4. Acute kidney injury Continue to monitor serum creatinine and GFR. Assess urine output on IV Lasix. Initial goal for diuresis should be to keep her net negative by 1-2 L over a 24-hour period.    Lonie Peak, MD  11/07/2017, 4:17 AM  Cardiology Overnight Team Please contact [facility] for night-coverage after hours (4p -7a ) and weekends on [email or web address]

## 2017-11-07 NOTE — Progress Notes (Signed)
ANTICOAGULATION CONSULT NOTE - Follow Up Consult  Pharmacy Consult for Warfarin  Indication: atrial fibrillation  Patient Measurements: Height: 4\' 10"  (147.3 cm) Weight: 170 lb 13.7 oz (77.5 kg) IBW/kg (Calculated) : 40.9 Vital Signs: Temp: 97.7 F (36.5 C) (07/20 0645) Temp Source: Oral (07/20 0645) BP: 117/58 (07/20 1000) Pulse Rate: 73 (07/20 1000)  Labs: Recent Labs    11/07/17 0217 11/07/17 0232 11/07/17 0409 11/07/17 0635  HGB 13.4 14.3 12.9  --   HCT 41.3 42.0 38.0  --   PLT 262  --   --   --   LABPROT  --   --   --  23.6*  INR  --   --   --  2.12  CREATININE 2.15* 2.10* 2.10*  --     Estimated Creatinine Clearance: 17.8 mL/min (A) (by C-G formula based on SCr of 2.1 mg/dL (H)).   Medical History: Past Medical History:  Diagnosis Date  . Acute on chronic renal failure [facility])    sees Dr Allena Katz   . Anemia    Acute blood loss  . Aortic regurgitation   . Aortic stenosis 10/13/2012   Low EF, low gradient with severe aortic stenosis confirmed by dobutamine stress echocardiogram s/p TAVR 12/2012  . Asthma   . Atrial fibrillation (HCC)    tachy-brady syndrome with <1% recurrent PAF since pacemaker placement  . Cataracts, bilateral   . Chronic combined systolic and diastolic CHF (congestive heart failure) (HCC)   . Chronic lower back pain   . CKD (chronic kidney disease)   . Coronary artery disease involving native coronary artery of native heart   . Dementia    Without behavioral disturbance  . Dysrhythmia   . Fibromyalgia   . Gastroesophageal reflux disease   . H/O dizziness   . H/O urinary frequency   . H/O: stroke   . Hard of hearing   . Headache    hx of migraines   . Heart murmur   . History of blood transfusion   . History of bronchitis   . History of kidney stones   . History of urinary tract infection   . HLD (hyperlipidemia)   . HTN (hypertension)   . Hypothyroidism   . Neuropathy   . Nonischemic cardiomyopathy (HCC)   . On home oxygen  therapy    patient uses at nite- 2L- has not used in > 6 months per patient  . Osteoarthritis   . Osteoporosis   . Pneumonia    hx of x 3   . PONV (postoperative nausea and vomiting)   . Presence of permanent cardiac pacemaker   . Pulmonary embolism (HCC)    HISTORY OF, the pt. had a recurrent bilateral pulmonary emboli in 2005, on warfarin therapy and at which time she under went implantation of IVC filter  . Repeated falls   . Rupture of right patellar tendon   . Sciatica   . Shortness of breath dyspnea    with exertion   . Sleep apnea    uses oxygen at night and PRN- not used since > 6 months / DOES NOT USE  C-PAP  . Spinal stenosis   . Stroke (HCC)   . Symptomatic bradycardia   . Symptomatic bradycardia 2012   s/p Medtronic PPM  . Syncope   . Urinary incontinence   . Urinary urgency     Assessment: 84 y/o F here with shortness of breath, and volume overload being treated for HF responding to IV  furosemide.  On warfarin PTA for afib, also hx of PE.  INR at goal on admit 2.12 cbc stable no bleeding noted.    Goal of Therapy:  INR 2-3 Monitor platelets by anticoagulation protocol: Yes   Plan:  Continue home dose warfarin 2mg  daily Daily protime  Leota Sauers Pharm.D. CPP, BCPS Clinical Pharmacist [phone] 11/07/2017 10:47 AM

## 2017-11-07 NOTE — ED Provider Notes (Signed)
[facility] EMERGENCY DEPARTMENT Provider Note   CSN: [id number] Arrival date & time:        History   Chief Complaint Chief Complaint  Patient presents with  . Respiratory Distress    HPI [name] is a 84 y.o. female.  Level 5 caveat for respiratory distress and acuity of condition.  Patient arrives via EMS with sudden onset shortness of breath from her living facility.  The report is this onset about 1 hour ago.  Patient does wear oxygen chronically.  She is placed on CPAP by EMS after  hearing rales in all fields.  O2 saturation increased to 80%.  Patient also complaining of chest pressure.  Denies any cough or fever.  [name] is a 84 y.o. female w/ hx NICM, chronic systolic heart failure, and severe AS s/p TAVR 12/21/2012 w/ 29 mm Edwards Sapien XT valve, paroxysmal atrial fibrillation, HTN, CKD stage IV, oxygen dependent chronic respiratory failure, chronic anticoagulation w/ warfarin s/p PPM  The history is provided by the patient and the EMS personnel. The history is limited by the condition of the patient.    Past Medical History:  Diagnosis Date  . Acute on chronic renal failure [facility])    sees Dr Allena Katz   . Anemia    Acute blood loss  . Aortic regurgitation   . Aortic stenosis 10/13/2012   Low EF, low gradient with severe aortic stenosis confirmed by dobutamine stress echocardiogram s/p TAVR 12/2012  . Asthma   . Atrial fibrillation (HCC)    tachy-brady syndrome with <1% recurrent PAF since pacemaker placement  . Cataracts, bilateral   . Chronic combined systolic and diastolic CHF (congestive heart failure) (HCC)   . Chronic lower back pain   . CKD (chronic kidney disease)   . Coronary artery disease involving native coronary artery of native heart   . Dementia    Without behavioral disturbance  . Dysrhythmia   . Fibromyalgia   . Gastroesophageal reflux disease   . H/O dizziness   . H/O urinary frequency   . H/O: stroke   .  Hard of hearing   . Headache    hx of migraines   . Heart murmur   . History of blood transfusion   . History of bronchitis   . History of kidney stones   . History of urinary tract infection   . HLD (hyperlipidemia)   . HTN (hypertension)   . Hypothyroidism   . Neuropathy   . Nonischemic cardiomyopathy (HCC)   . On home oxygen therapy    patient uses at nite- 2L- has not used in > 6 months per patient  . Osteoarthritis   . Osteoporosis   . Pneumonia    hx of x 3   . PONV (postoperative nausea and vomiting)   . Presence of permanent cardiac pacemaker   . Pulmonary embolism (HCC)    HISTORY OF, the pt. had a recurrent bilateral pulmonary emboli in 2005, on warfarin therapy and at which time she under went implantation of IVC filter  . Repeated falls   . Rupture of right patellar tendon   . Sciatica   . Shortness of breath dyspnea    with exertion   . Sleep apnea    uses oxygen at night and PRN- not used since > 6 months / DOES NOT USE  C-PAP  . Spinal stenosis   . Stroke (HCC)   . Symptomatic bradycardia   . Symptomatic bradycardia  2012   s/p Medtronic PPM  . Syncope   . Urinary incontinence   . Urinary urgency     Patient Active Problem List   Diagnosis Date Noted  . Anticoagulation management encounter 09/29/2016  . Respiratory failure (HCC) 07/21/2016  . Acute on chronic respiratory failure with hypoxia (HCC) 07/21/2016  . Head contusion 07/15/2016  . Fall 07/14/2016  . Chest pain at rest   . Acute on chronic combined systolic and diastolic congestive heart failure (HCC)   . Unstable angina (HCC)   . Acute respiratory failure (HCC) 06/13/2016  . Elevated blood sugar 06/13/2016  . Dementia without behavioral disturbance 01/09/2016  . Chronic constipation 01/09/2016  . Neuropathic pain 01/09/2016  . Esophageal reflux 01/09/2016  . Hypothyroidism 01/09/2016  . CKD (chronic kidney disease) stage 4, GFR 15-29 ml/min (HCC) 01/09/2016  . Right patellar tendon  rupture 06/25/2015  . Avulsion of right patellar tendon 03/08/2015  . Aspiration pneumonia (HCC) 12/14/2014  . Anemia of chronic disease 12/14/2014  . Aspiration into airway 12/14/2014  . Swallowing dysfunction 12/14/2014  . S/P right TKA 12/04/2014  . S/P knee replacement 12/04/2014  . Acute blood loss anemia 12/28/2012  . Thrombocytopenia (HCC) 12/28/2012  . Toe fracture 12/28/2012  . CKD (chronic kidney disease), stage III (HCC) 12/28/2012  . Low back pain 12/28/2012  . Wound dehiscence, surgical 12/28/2012  . Atelectasis 12/28/2012  . S/P TAVR (transcatheter aortic valve replacement) 12/21/2012  . Severe aortic stenosis 12/06/2012  . Aortic stenosis 10/13/2012  . Acute on chronic systolic and diastolic heart failure, NYHA class 3 (HCC) 10/13/2012  . Pacemaker 10/15/2010  . Paroxysmal atrial fibrillation (HCC) 10/15/2010  . Dyslipidemia 10/15/2010  . Hypertension 10/15/2010  . Chronic diastolic heart failure (HCC) 10/15/2010    Past Surgical History:  Procedure Laterality Date  . ABDOMINAL HYSTERECTOMY    . APPENDECTOMY    . BACK SURGERY    . BUNIONECTOMY    . CARDIAC CATHETERIZATION    . CATARACT EXTRACTION    . CENTRAL VENOUS CATHETER INSERTION Left 12/21/2012   Procedure: INSERTION CENTRAL LINE ADULT;  Surgeon: Tonny Bollman, MD;  Location: [facility] OR;  Service: Open Heart Surgery;  Laterality: Left;  . ELBOW SURGERY     bilat   . INTRAOPERATIVE TRANSESOPHAGEAL ECHOCARDIOGRAM N/A 12/21/2012   Procedure: INTRAOPERATIVE TRANSESOPHAGEAL ECHOCARDIOGRAM;  Surgeon: Tonny Bollman, MD;  Location: [facility] OR;  Service: Open Heart Surgery;  Laterality: N/A;  . KNEE SURGERY Left   . LEFT AND RIGHT HEART CATHETERIZATION WITH CORONARY ANGIOGRAM N/A 10/18/2012   Procedure: LEFT AND RIGHT HEART CATHETERIZATION WITH CORONARY ANGIOGRAM;  Surgeon: Tonny Bollman, MD;  Location: [facility] CATH LAB;  Service: Cardiovascular;  Laterality: N/A;  . NASAL SEPTUM SURGERY    . NOSE SURGERY     X 2  . ORIF  PATELLA Right 03/08/2015   Procedure:  OPEN REDUCTION INTERNAL FIXATION RIGHT  PATELLA TENDON AVULSION;  Surgeon: Durene Romans, MD;  Location: [facility] ORS;  Service: Orthopedics;  Laterality: Right;  . PACEMAKER INSERTION     Medtronic  . PATELLAR TENDON REPAIR Right 06/25/2015   Procedure: RIGHT PATELLA TENDON REVISION/REPAIR;  Surgeon: Durene Romans, MD;  Location: [facility] ORS;  Service: Orthopedics;  Laterality: Right;  . SHOULDER SURGERY     bilat   . THYROIDECTOMY, PARTIAL    . TONSILLECTOMY    . TOTAL KNEE ARTHROPLASTY Right 12/04/2014   Procedure: RIGHT TOTAL  KNEE ARTHROPLASTY;  Surgeon: Durene Romans, MD;  Location: [facility] ORS;  Service: Orthopedics;  Laterality: Right;  . TRANSCATHETER AORTIC VALVE REPLACEMENT, TRANSFEMORAL  12/21/2012   a. 29mm Edwards Sapien XT transcatheter heart valve placed via open left transfemoral approach b. Intra-op TEE: well-seated bioprosthetic aortic valve with mean gradient 2 mmHg, trivial AI, mild MR, EF 30-35%  . TRANSCATHETER AORTIC VALVE REPLACEMENT, TRANSFEMORAL N/A 12/21/2012   Procedure: TRANSCATHETER AORTIC VALVE REPLACEMENT, TRANSFEMORAL;  Surgeon: Tonny Bollman, MD;  Location: [facility] OR;  Service: Open Heart Surgery;  Laterality: N/A;     OB History   None      Home Medications    Prior to Admission medications   Medication Sig Start Date End Date Taking? Authorizing Provider  acetaminophen (TYLENOL) 325 MG tablet Take 650 mg by mouth every 4 (four) hours as needed for mild pain.    [provider]  alendronate (FOSAMAX) 70 MG/75ML solution Take 70 mg by mouth every Saturday. Take on an empty stomach with a full glass of fluids, remain upright 30 minutes after administration - for bone health. Give 75 mL    [provider]  amiodarone (PACERONE) 100 MG tablet Take 100 mg by mouth daily.    [provider]  atorvastatin (LIPITOR) 10 MG tablet Take 10 mg by mouth daily at 6 PM.     [provider]  Biotin 5000 MCG CAPS Take  5,000 mcg by mouth daily at 6 PM.    [provider]  bisacodyl (DULCOLAX) 10 MG suppository Place 10 mg rectally daily as needed (constipation).     [provider]  calcitRIOL (ROCALTROL) 0.25 MCG capsule Take 0.25 mcg by mouth daily.     [provider]  camphor-menthol Wynelle Fanny) lotion Apply 1 application topically as needed for itching.    [provider]  carvedilol (COREG) 3.125 MG tablet Take 1 tablet (3.125 mg total) by mouth 2 (two) times daily with a meal. 07/17/16   Penny Pia, MD  cholecalciferol (VITAMIN D) 1000 units tablet Take 2,000 Units by mouth at bedtime.    [provider]  cycloSPORINE (RESTASIS) 0.05 % ophthalmic emulsion Place 1 drop into both eyes 2 (two) times daily.     [provider]  donepezil (ARICEPT) 10 MG tablet Take 10 mg by mouth at bedtime.     [provider]  DULoxetine (CYMBALTA) 60 MG capsule Take 60 mg by mouth daily at 6 PM.     [provider]  esomeprazole (NEXIUM) 40 MG capsule Take 40 mg by mouth daily.     [provider]  febuxostat (ULORIC) 40 MG tablet Take 20 mg by mouth daily at 6 PM.     [provider]  ferrous sulfate 325 (65 FE) MG tablet Take 325 mg by mouth daily.     [provider]  hydrALAZINE (APRESOLINE) 10 MG tablet Take 1 tablet (10 mg total) by mouth 3 (three) times daily. 07/17/16   Penny Pia, MD  HYDROcodone-acetaminophen (NORCO/VICODIN) 5-325 MG tablet Take 1 tablet by mouth 2 (two) times daily as needed (Pain - hold for sedation).    [provider]  ipratropium-albuterol (DUONEB) 0.5-2.5 (3) MG/3ML SOLN Take 3 mLs by nebulization every 6 (six) hours as needed (wheezing/shortness of breath).    [provider]  isosorbide mononitrate (IMDUR) 30 MG 24 hr tablet Take 1 tablet (30 mg total) by mouth daily. 08/05/16 11/04/16  Manson Passey, PA  levothyroxine (SYNTHROID, LEVOTHROID) 112 MCG tablet Take 112 mcg by  mouth daily.     [provider]  Linaclotide (LINZESS) 145 MCG CAPS capsule Take 145 mcg by mouth daily.     [provider]  memantine (NAMENDA) 5 MG tablet Take 5 mg by mouth 2 (two) times daily.     [provider]  metolazone (ZAROXOLYN) 2.5 MG tablet Take one tablet by mouth 30 minutes prior to morning dosage of Torsemide if dry weight is 5 lb greater than 168 lbs.  No more than twice per week. 10/08/16   Tonny Bollman, MD  Multiple Vitamin (MULTIVITAMIN WITH MINERALS) TABS tablet Take 1 tablet by mouth daily.    [provider]  multivitamin (RENA-VIT) TABS tablet Take 1 tablet by mouth daily.     [provider]  nitroGLYCERIN (NITROSTAT) 0.4 MG SL tablet Place 0.4 mg under the tongue every 5 (five) minutes as needed for chest pain (MAX 3 TABLETS).     [provider]  polyethylene glycol (MIRALAX / GLYCOLAX) packet Take 17 g by mouth 2 (two) times daily. 07/30/16   Rolly Salter, MD  potassium chloride (KLOR-CON) 20 MEQ packet Take 20 mEq by mouth daily.     [provider]  pramoxine (SARNA SENSITIVE) 1 % LOTN Apply 1 application topically as needed (itching).     [provider]  pregabalin (LYRICA) 25 MG capsule Take 25 mg by mouth 2 (two) times daily. 6AM and 6PM    [provider]  senna-docusate (SENOKOT-S) 8.6-50 MG tablet Take 1 tablet by mouth 2 (two) times daily. 07/30/16   Rolly Salter, MD  simethicone (MYLICON) 80 MG chewable tablet Chew 80 mg by mouth every 12 (twelve) hours as needed for flatulence.     [provider]  torsemide (DEMADEX) 20 MG tablet Take 40 mg by mouth daily.     [provider]  vitamin B-12 (CYANOCOBALAMIN) 1000 MCG tablet Take 1,000 mcg by mouth 2 (two) times a week. Tuesday and Friday    [provider]  warfarin (COUMADIN) 2 MG tablet Take 2 mg by mouth daily.    [provider]    Family History Family History  Problem Relation  Age of Onset  . Ovarian cancer Mother        Deceased  . Epilepsy Father        Deceased    Social History Social History   Tobacco Use  . Smoking status: Never Smoker  . Smokeless tobacco: Never Used  Substance Use Topics  . Alcohol use: No  . Drug use: No     Allergies   Nubain [nalbuphine hcl]; Codeine; and Darvocet [propoxyphene n-acetaminophen]   Review of Systems Review of Systems  Unable to perform ROS: Acuity of condition     Physical Exam Updated Vital Signs Ht 4\' 9"  (1.448 m)   Wt 72.6 kg (160 lb)   BMI 34.62 kg/m   Physical Exam  Constitutional: She appears well-developed and well-nourished. She appears distressed.  HENT:  Head: Normocephalic and atraumatic.  Eyes: Pupils are equal, round, and reactive to light. EOM are normal.  Neck: Normal range of motion. Neck supple.  Cardiovascular: Regular rhythm.  Murmur heard. Tachycardia to 110  Pulmonary/Chest: She is in respiratory distress. She has rales.  Respiratory distress on CPAP, rales throughout  Abdominal: There is no tenderness. There is no rebound.  Musculoskeletal: Normal range of motion. She exhibits edema.  +2 edema to knees  Neurological:  Awake and alert, moving all extremities  Skin: Skin is warm. Capillary refill takes less than 2 seconds.  She is diaphoretic.  Psychiatric: Her behavior is normal.     ED Treatments / Results  Labs (all labs ordered are listed, but only abnormal results are displayed) Labs Reviewed  BASIC METABOLIC PANEL - Abnormal; Notable for the following components:      Result Value   Chloride 97 (*)    Glucose, Bld 258 (*)    BUN 41 (*)    Creatinine, Ser 2.15 (*)    GFR calc non Af Amer 20 (*)    GFR calc Af Amer 23 (*)    All other components within normal limits  CBC WITH DIFFERENTIAL/PLATELET - Abnormal; Notable for the following components:   WBC 14.6 (*)    MCV 101.2 (*)    Neutro Abs 11.0 (*)    All other components within normal limits    BRAIN NATRIURETIC PEPTIDE - Abnormal; Notable for the following components:   B Natriuretic Peptide 491.3 (*)    All other components within normal limits  I-STAT TROPONIN, ED - Abnormal; Notable for the following components:   Troponin i, poc 0.16 (*)    All other components within normal limits  I-STAT CHEM 8, ED - Abnormal; Notable for the following components:   Sodium 132 (*)    BUN 64 (*)    Creatinine, Ser 2.10 (*)    Glucose, Bld 254 (*)    Calcium, Ion 1.09 (*)    All other components within normal limits  I-STAT CG4 LACTIC ACID, ED  I-STAT TROPONIN, ED  I-STAT CG4 LACTIC ACID, ED    EKG EKG Interpretation  Date/Time:  Saturday November 07 2017 02:17:53 EDT Ventricular Rate:  107 PR Interval:    QRS Duration: 155 QT Interval:  375 QTC Calculation: 501 R Axis:   -74 Text Interpretation:  Sinus tachycardia Nonspecific IVCD with LAD Abnormal lateral Q waves similar to previous Confirmed by Glynn Octave [phone]) on 11/07/2017 2:22:06 AM Also confirmed by Glynn Octave [phone]), editor [place], Miranda ([id number])  on 11/07/2017 8:15:47 AM   Radiology Dg Chest Portable 1 View  Result Date: 11/07/2017 CLINICAL DATA:  84 year old female with respiratory distress. EXAM: PORTABLE CHEST 1 VIEW COMPARISON:  Chest CT dated 11/03/2017 FINDINGS: There is cardiomegaly with vascular congestion and mild edema. Small left pleural effusion and left lung base atelectasis. Pneumonia is not excluded. There is no pneumothorax. Right pectoral pacemaker device and mechanical aortic valve. No acute osseous pathology. IMPRESSION: Cardiomegaly with findings of CHF and small left pleural effusion. Pneumonia is not excluded. Clinical correlation is recommended. Electronically Signed   By: Elgie Collard M.D.   On: 11/07/2017 02:32    Procedures Procedures (including critical care time)  Medications Ordered in ED Medications  nitroGLYCERIN 50 mg in dextrose 5 % 250 mL (0.2 mg/mL) infusion (has no  administration in time range)  furosemide (LASIX) injection 80 mg (has no administration in time range)     Initial Impression / Assessment and Plan / ED Course  I have reviewed the triage vital signs and the nursing notes.  Pertinent labs & imaging results that were available during my care of the patient were reviewed by me and considered in my medical decision making (see chart for details).     Patient with apparent CHF exacerbation.  Placed on BiPAP on arrival with improvement in O2 saturation.  She is given IV nitroglycerin and IV Lasix.  EKG shows no acute ischemia.  Patient transitioned to BiPAP with improvement in her work of breathing and hypoxia.  She is diuresing after IV Lasix and IV nitroglycerin.  Troponin +0.16.  Patient states her chest pain is improving.  X-ray confirms pulmonary edema.  Suspect her troponin elevation is due to demand ischemia.  Discussed with cardiology fellow Dr. Deforest Hoyles  Will consult on patient and agrees with hospital admission.   Elevated K of 7.9 on istat appears spurious as recheck potassium is normal.  Slight elevation of Creatinine noted, suspect will improve with diuresis.   Admission d/w Dr. Katrinka Blazing.  CRITICAL CARE Performed by: Glynn Octave Total critical care time: 45 minutes Critical care time was exclusive of separately billable procedures and treating other patients. Critical care was necessary to treat or prevent imminent or life-threatening deterioration. Critical care was time spent personally by me on the following activities: development of treatment plan with patient and/or surrogate as well as nursing, discussions with consultants, evaluation of patient's response to treatment, examination of patient, obtaining history from patient or surrogate, ordering and performing treatments and interventions, ordering and review of laboratory studies, ordering and review of radiographic studies, pulse oximetry and re-evaluation of  patient's condition.      Final Clinical Impressions(s) / ED Diagnoses   Final diagnoses:  Acute respiratory failure with hypoxia (HCC)  Acute on chronic systolic congestive heart failure [facility])    ED Discharge Orders    None       Glynn Octave, MD 11/07/17 [phone]

## 2017-11-08 ENCOUNTER — Inpatient Hospital Stay (HOSPITAL_COMMUNITY): Payer: Medicare Other

## 2017-11-08 DIAGNOSIS — I361 Nonrheumatic tricuspid (valve) insufficiency: Secondary | ICD-10-CM

## 2017-11-08 DIAGNOSIS — I371 Nonrheumatic pulmonary valve insufficiency: Secondary | ICD-10-CM

## 2017-11-08 LAB — CBC
HEMATOCRIT: 31.8 % — AB (ref 36.0–46.0)
Hemoglobin: 10.8 g/dL — ABNORMAL LOW (ref 12.0–15.0)
MCH: 32.8 pg (ref 26.0–34.0)
MCHC: 34 g/dL (ref 30.0–36.0)
MCV: 96.7 fL (ref 78.0–100.0)
Platelets: 226 10*3/uL (ref 150–400)
RBC: 3.29 MIL/uL — ABNORMAL LOW (ref 3.87–5.11)
RDW: 13.7 % (ref 11.5–15.5)
WBC: 12.2 10*3/uL — AB (ref 4.0–10.5)

## 2017-11-08 LAB — BASIC METABOLIC PANEL
Anion gap: 13 (ref 5–15)
BUN: 38 mg/dL — AB (ref 8–23)
CO2: 30 mmol/L (ref 22–32)
CREATININE: 1.99 mg/dL — AB (ref 0.44–1.00)
Calcium: 9.2 mg/dL (ref 8.9–10.3)
Chloride: 95 mmol/L — ABNORMAL LOW (ref 98–111)
GFR calc non Af Amer: 22 mL/min — ABNORMAL LOW (ref >60)
GFR, EST AFRICAN AMERICAN: 26 mL/min — AB (ref >60)
GLUCOSE: 100 mg/dL — AB (ref 70–99)
Potassium: 3.2 mmol/L — ABNORMAL LOW (ref 3.5–5.1)
Sodium: 138 mmol/L (ref 135–145)

## 2017-11-08 LAB — PROTIME-INR
INR: 2.48
PROTHROMBIN TIME: 26.6 s — AB (ref 11.4–15.2)

## 2017-11-08 LAB — MAGNESIUM: Magnesium: 1.7 mg/dL (ref 1.7–2.4)

## 2017-11-08 LAB — ECHOCARDIOGRAM COMPLETE
Height: 58 in
WEIGHTICAEL: 2712.54 [oz_av]

## 2017-11-08 MED ORDER — POTASSIUM CHLORIDE CRYS ER 20 MEQ PO TBCR
40.0000 meq | EXTENDED_RELEASE_TABLET | Freq: Two times a day (BID) | ORAL | Status: DC
  Administered 2017-11-08 (×3): 40 meq via ORAL
  Filled 2017-11-08 (×3): qty 2

## 2017-11-08 MED ORDER — SPIRONOLACTONE 25 MG PO TABS
25.0000 mg | ORAL_TABLET | Freq: Every day | ORAL | Status: DC
  Administered 2017-11-08: 25 mg via ORAL
  Filled 2017-11-08: qty 1

## 2017-11-08 MED ORDER — MAGNESIUM SULFATE IN D5W 1-5 GM/100ML-% IV SOLN
1.0000 g | Freq: Once | INTRAVENOUS | Status: AC
  Administered 2017-11-08: 1 g via INTRAVENOUS
  Filled 2017-11-08: qty 100

## 2017-11-08 NOTE — Progress Notes (Signed)
Subjective:  Says that her breathing is better today.  No complaints of chest pain.  Complaining of fairly severe back pain.  Mildly obese  Objective:  Vital Signs in the last 24 hours: BP (!) 126/57   Pulse 98   Temp 98.1 F (36.7 C) (Oral)   Resp 20   Ht 4\' 10"  (1.473 m)   Wt 76.9 kg (169 lb 8.5 oz)   SpO2 96%   BMI 35.43 kg/m    Physical Exam: Female complaining of back pain. Lungs:  Clear today Cardiac:  Regular rhythm, normal S1 and S2, no S3 Extremities:  1+ edema present  Intake/Output from previous day: 07/20 0701 - 07/21 0700 In: 449.9 [P.O.:360; IV Piggyback:89.9] Out: 1800 [Urine:1800] Weight Filed Weights   11/07/17 0220 11/07/17 0651 11/08/17 0406  Weight: 72.6 kg (160 lb) 77.5 kg (170 lb 13.7 oz) 76.9 kg (169 lb 8.5 oz)    Lab Results: Basic Metabolic Panel: Recent Labs    11/07/17 0217  11/07/17 0409 11/07/17 0418 11/08/17 0251  NA 135   < > 131*  --  138  K 5.1   < > 7.9* 3.8 3.2*  CL 97*   < > 98  --  95*  CO2 25  --   --   --  30  GLUCOSE 258*   < > 129*  --  100*  BUN 41*   < > 71*  --  38*  CREATININE 2.15*   < > 2.10*  --  1.99*   < > = values in this interval not displayed.    CBC: Recent Labs    11/07/17 0217  11/07/17 0409 11/08/17 0251  WBC 14.6*  --   --  12.2*  NEUTROABS 11.0*  --   --   --   HGB 13.4   < > 12.9 10.8*  HCT 41.3   < > 38.0 31.8*  MCV 101.2*  --   --  96.7  PLT 262  --   --  226   < > = values in this interval not displayed.    BNP    Component Value Date/Time   BNP 491.3 (H) 11/07/2017 0219    Cardiac Panel (last 3 results) Recent Labs    11/07/17 1225 11/07/17 1928  TROPONINI 0.32* 0.33*   Telemetry: Sinus rhythm personally reviewed says that her breathing is better today.  Assessment/Plan:  1.  Acute on chronic systolic heart failure clinically somewhat better today.  Creatinine has improved.  Her weight is down 1 pound. 2.  Prior aortic stenosis treated with TAVR 3.  Chronic kidney  disease some improvement  Recommendations:  Appears to be responding adequately to current treatment for heart failure.  Continue diuresis.  Address back pain.     Darden Palmer  MD [facility]  11/08/2017, 10:11 AM

## 2017-11-08 NOTE — Progress Notes (Signed)
Patient resting comfortably on 2L [place] with no respiratory distress noted. BIPAP is not needed at this time. RT will monitor as needed. 

## 2017-11-08 NOTE — Progress Notes (Signed)
  Echocardiogram 2D Echocardiogram has been performed.  Delcie Roch 11/08/2017, 1:51 PM

## 2017-11-08 NOTE — Evaluation (Signed)
Physical Therapy Evaluation Patient Details Name: [name] MRN: [id number] DOB: 08-06-1932 Today's Date: 11/08/2017   History of Present Illness  Pt is a 84 y.o. F with significant PMH of HTN, HLD, oxygen dependent chronic respiratory failure, CKD, a.fib, chronic combined systolic and diastolic CHF last EF 35-40% with grade 2 DD., fibryomyalgia, dementia, nonischemic cardiomyopathy, DVT/PE, symptomatic bradycardia s/p PM placement, CVA, OSA, hypothyroidism who presented from nursing facility with acute respiratory distress.   Clinical Impression  Pt admitted with above diagnosis. Pt currently with functional limitations due to the deficits listed below (see PT Problem List). Patient admitted from [facility]. Currently transferring from bed to chair with min guard assist and walker. Continues to present as a high fall risk due to decreased safety awareness/cognitive deficits, balance impairments, and weakness. Pt will benefit from skilled PT to increase their independence and safety with mobility to allow discharge to the venue listed below.       Follow Up Recommendations SNF    Equipment Recommendations  None recommended by PT    Recommendations for Other Services       Precautions / Restrictions Precautions Precautions: Fall Restrictions Weight Bearing Restrictions: No      Mobility  Bed Mobility Overal bed mobility: Needs Assistance Bed Mobility: Supine to Sit     Supine to sit: Supervision     General bed mobility comments: Increased time and effort  Transfers Overall transfer level: Needs assistance Equipment used: Rolling walker (2 wheeled) Transfers: Sit to/from Stand Sit to Stand: Min guard            Ambulation/Gait Ambulation/Gait assistance: Min guard Gait Distance (Feet): 3 Feet Assistive device: Rolling walker (2 wheeled) Gait Pattern/deviations: Step-to pattern;Trunk flexed;Decreased stride length     General Gait Details: Pivotal steps  from bed to recliner using walker. Patient with trunk flexed and poor proximity to walker.  Stairs            Wheelchair Mobility    Modified Rankin (Stroke Patients Only)       Balance Overall balance assessment: Needs assistance Sitting-balance support: No upper extremity supported;Feet supported Sitting balance-Leahy Scale: Good     Standing balance support: Bilateral upper extremity supported Standing balance-Leahy Scale: Poor Standing balance comment: reliant on external support                             Pertinent Vitals/Pain Pain Assessment: Faces Faces Pain Scale: Hurts a little bit Pain Location: back Pain Descriptors / Indicators: Grimacing Pain Intervention(s): Monitored during session;Repositioned    Home Living Family/patient expects to be discharged to:: Skilled nursing facility([facility])                      Prior Function Level of Independence: Needs assistance   Gait / Transfers Assistance Needed: Reports she walks ~3 times a week with OT and PT using a walker  ADL's / Homemaking Assistance Needed: Reports she bathes and dresses herself with set up assistance        Hand Dominance        Extremity/Trunk Assessment   Upper Extremity Assessment Upper Extremity Assessment: Generalized weakness    Lower Extremity Assessment Lower Extremity Assessment: Generalized weakness    Cervical / Trunk Assessment Cervical / Trunk Assessment: Kyphotic  Communication   Communication: HOH  Cognition Arousal/Alertness: Awake/alert Behavior During Therapy: WFL for tasks assessed/performed Overall Cognitive Status: History of cognitive impairments -  at baseline                                 General Comments: History of dementia. Patient very pleasant and cooperative and follows simple commands consistently.       General Comments General comments (skin integrity, edema, etc.): foam cushion applied to  sacrum    Exercises     Assessment/Plan    PT Assessment Patient needs continued PT services  PT Problem List Decreased strength;Decreased activity tolerance;Decreased balance;Decreased mobility;Decreased cognition;Pain       PT Treatment Interventions Gait training;Functional mobility training;Therapeutic activities;Therapeutic exercise;Balance training;Patient/family education    PT Goals (Current goals can be found in the Care Plan section)  Acute Rehab PT Goals Patient Stated Goal: "walk more with PT." PT Goal Formulation: With patient Time For Goal Achievement: 11/22/17 Potential to Achieve Goals: Fair    Frequency Min 2X/week   Barriers to discharge        Co-evaluation               AM-PAC PT "6 Clicks" Daily Activity  Outcome Measure Difficulty turning over in bed (including adjusting bedclothes, sheets and blankets)?: A Little Difficulty moving from lying on back to sitting on the side of the bed? : A Little Difficulty sitting down on and standing up from a chair with arms (e.g., wheelchair, bedside commode, etc,.)?: A Little Help needed moving to and from a bed to chair (including a wheelchair)?: A Little Help needed walking in hospital room?: A Little Help needed climbing 3-5 steps with a railing? : A Lot 6 Click Score: 17    End of Session   Activity Tolerance: Patient tolerated treatment well Patient left: in chair;with call bell/phone within reach Nurse Communication: Mobility status PT Visit Diagnosis: Muscle weakness (generalized) (M62.81);Difficulty in walking, not elsewhere classified (R26.2);Pain Pain - part of body: (back)    Time: 0300-9233 PT Time Calculation (min) (ACUTE ONLY): 17 min   Charges:   PT Evaluation $PT Eval Moderate Complexity: 1 Mod     PT G Codes:        Laurina Bustle, PT, DPT Acute Rehabilitation Services  Pager: [phone]  Vanetta Mulders 11/08/2017, 4:52 PM

## 2017-11-08 NOTE — Progress Notes (Addendum)
ANTICOAGULATION CONSULT NOTE - Follow Up Consult  Pharmacy Consult for Warfarin  Indication: atrial fibrillation  Patient Measurements: Height: 4\' 10"  (147.3 cm) Weight: 169 lb 8.5 oz (76.9 kg) IBW/kg (Calculated) : 40.9 Vital Signs: Temp: 98.1 F (36.7 C) (07/21 0824) Temp Source: Oral (07/21 0824) BP: 126/57 (07/21 0824) Pulse Rate: 98 (07/21 0824)  Labs: Recent Labs    11/07/17 0217 11/07/17 0232 11/07/17 0409 11/07/17 0635 11/07/17 1225 11/07/17 1928 11/08/17 0251 11/08/17 0834  HGB 13.4 14.3 12.9  --   --   --  10.8*  --   HCT 41.3 42.0 38.0  --   --   --  31.8*  --   PLT 262  --   --   --   --   --  226  --   LABPROT  --   --   --  23.6*  --   --   --  26.6*  INR  --   --   --  2.12  --   --   --  2.48  CREATININE 2.15* 2.10* 2.10*  --   --   --  1.99*  --   TROPONINI  --   --   --   --  0.32* 0.33*  --   --     Estimated Creatinine Clearance: 18.7 mL/min (A) (by C-G formula based on SCr of 1.99 mg/dL (H)).   Medical History: Past Medical History:  Diagnosis Date  . Acute on chronic renal failure [facility])    sees Dr Allena Katz   . Anemia    Acute blood loss  . Aortic regurgitation   . Aortic stenosis 10/13/2012   Low EF, low gradient with severe aortic stenosis confirmed by dobutamine stress echocardiogram s/p TAVR 12/2012  . Asthma   . Atrial fibrillation (HCC)    tachy-brady syndrome with <1% recurrent PAF since pacemaker placement  . Cataracts, bilateral   . Chronic combined systolic and diastolic CHF (congestive heart failure) (HCC)   . Chronic lower back pain   . CKD (chronic kidney disease)   . Coronary artery disease involving native coronary artery of native heart   . Dementia    Without behavioral disturbance  . Dysrhythmia   . Fibromyalgia   . Gastroesophageal reflux disease   . H/O dizziness   . H/O urinary frequency   . H/O: stroke   . Hard of hearing   . Headache    hx of migraines   . Heart murmur   . History of blood transfusion   .  History of bronchitis   . History of kidney stones   . History of urinary tract infection   . HLD (hyperlipidemia)   . HTN (hypertension)   . Hypothyroidism   . Neuropathy   . Nonischemic cardiomyopathy (HCC)   . On home oxygen therapy    patient uses at nite- 2L- has not used in > 6 months per patient  . Osteoarthritis   . Osteoporosis   . Pneumonia    hx of x 3   . PONV (postoperative nausea and vomiting)   . Presence of permanent cardiac pacemaker   . Pulmonary embolism (HCC)    HISTORY OF, the pt. had a recurrent bilateral pulmonary emboli in 2005, on warfarin therapy and at which time she under went implantation of IVC filter  . Repeated falls   . Rupture of right patellar tendon   . Sciatica   . Shortness of breath dyspnea  with exertion   . Sleep apnea    uses oxygen at night and PRN- not used since > 6 months / DOES NOT USE  C-PAP  . Spinal stenosis   . Stroke (HCC)   . Symptomatic bradycardia   . Symptomatic bradycardia 2012   s/p [employer] PPM  . Syncope   . Urinary incontinence   . Urinary urgency     Assessment: 84 y/o F here with shortness of breath, and volume overload being treated for HF responding to IV furosemide.  On warfarin PTA 2mg  daily for afib, also hx of PE.  INR at goal on admit.  Today INR increased to 2.48 after continuing PTA warfarin CBC stable, no bleeding noted.  Goal of Therapy:  INR 2-3 Monitor platelets by anticoagulation protocol: Yes   Plan:  Continue home dose warfarin 2mg  x1 today, may need to adjust if INR bumps up again Monitor daily INR, CBC  Thank you for involving pharmacy in this patient's care.  Wendelyn Breslow, PharmD PGY1 Pharmacy Resident Phone: [phone] 11/08/2017 1:34 PM

## 2017-11-08 NOTE — Progress Notes (Signed)
@IPLOG @        PROGRESS NOTE                                                                                                                                                                                                             Patient Demographics:    [name], is a 84 y.o. female, DOB - 1933-02-07, ZOX:[id number]  Admit date - 11/07/2017   Admitting Physician Clydie Braun, MD  Outpatient Primary MD for the patient is Pearson Grippe, MD  LOS - 1  Chief Complaint  Patient presents with  . Respiratory Distress       Brief Narrative  [name] is a 84 y.o. female with medical history significant of HTN, HLD, oxygen dependent chronic respiratory failure, CKD 4, paroxysmal A. fib, chronic combined systolic and diastolic CHF last EF 35-40% with grade 2 DD., fibromyalgia, dementia, nonischemic cardiomyopathy, DVT/PE, symptomatic bradycardia s/p PM placement, CVA, OSA, hypothyroidism; who presented from her nursing facility with acute respiratory distress.  History is limited due to patient condition. En route with EMS patient was noted to have rales in all lung fields and placed on CPAP with O2 saturations noted to be in the 80s.  She was admitted for acute on chronic hypoxic respiratory failure due to acute on chronic CHF.   Diuresis she is much improved, now she is complaining of acute on chronic low back pain which she says has gotten worse over the last week, since she has a pacemaker CT scan T and L-spine stat has been ordered.  Kindly monitor.  She has chronic light lower extremity weakness which is unchanged, she does not have any cauda equina-like symptoms.    Subjective:   Patient in bed, appears comfortable, denies any headache, no fever, no chest pain or pressure, no shortness of breath , no abdominal pain. No focal weakness.  Continues to have acute on chronic low back pain mostly on the right side.   Assessment  & Plan :     1.  Acute on chronic hypoxic  respiratory failure due to acute on chronic combined systolic and diastolic CHF EF 35 to 40%.  Patient chronically on home oxygen, has been placed on IV Lasix, will give her a dose of Zaroxolyn which she takes as needed at home, initiated Coreg from tomorrow, resume home dose imdur, if blood pressure allows will add hydralazine, continue supplemental oxygen, monitor intake output, weight and BMP closely.  No ACE/ARB due to renal insufficiency.  2.  ARF on CKD  4.  Baseline creatinine around 1.6.  Monitor with diuretics, avoid nephrotoxins.  3.  Paroxysmal atrial fibrillation.  [place] vas 2 score of at least 4.  Continue amiodarone, resume Coreg from tomorrow, pharmacy monitoring Coumadin dose.  4.  Dyslipidemia.  On statin continue.  5.  Hypothyroidism.  On Synthroid.  TSH stable.  6.  Sinus syndrome.  Status post pacemaker placement.  7.  Chronic constipation.  Last BM 5 days prior to the day of admission.  Continue home medication Linzess, added MiraLAX and Dulcolax suppository.  8.  Gout.  Stable continue home dose allopurinol.  9.  Acute on Chronic back pain and peripheral neuropathy.  Continue low-dose home narcotic regimen along with Neurontin.  She claims that her back pain got acutely worse 3 to 4 days prior to the hospitalization, no cauda equina symptoms, no recent injuries, she claims she has had 3-4 back surgeries in the remote past, since she has pacemaker will check CT scan T and L-spine and monitor, continue supportive care note she has chronic weakness in her right leg due to multiple back surgeries and right knee surgery in the past.  10.  Single mildly elevated troponin.  Likely due to #1 above, will trend.  Check echocardiogram to evaluate wall motion and EF, continue combination of Coumadin, beta-blocker and statin for secondary prevention.  No chest pain.  Cardiology on board.    Diet :  Diet Order           Diet Heart Room service appropriate? Yes; Fluid consistency: Thin;  Fluid restriction: 1500 mL Fluid  Diet effective now            Family Communication  : None present  Code Status : Full  Disposition Plan  : Stepdown  Consults  : Cardiology  Procedures  :    Echocardiogram ordered and pending  DVT Prophylaxis  : Coumadin  Lab Results  Component Value Date   INR 2.48 11/08/2017   INR 2.12 11/07/2017   INR 2.53 07/30/2016     Lab Results  Component Value Date   PLT 226 11/08/2017    Inpatient Medications  Scheduled Meds: . allopurinol  100 mg Oral Daily  . amiodarone  100 mg Oral Daily  . atorvastatin  10 mg Oral q1800  . bisacodyl  10 mg Rectal Daily  . calcitRIOL  0.25 mcg Oral Daily  . carvedilol  3.125 mg Oral BID WC  . cycloSPORINE  1 drop Both Eyes BID  . donepezil  10 mg Oral QHS  . DULoxetine  60 mg Oral q1800  . ferrous sulfate  325 mg Oral Q breakfast  . furosemide  80 mg Intravenous BID  . gabapentin  200 mg Oral BID  . isosorbide mononitrate  30 mg Oral Daily  . levothyroxine  100 mcg Oral QAC breakfast  . linaclotide  145 mcg Oral Daily  . mouth rinse  15 mL Mouth Rinse BID  . Melatonin  3 mg Oral QHS  . MUSCLE RUB  1 application Topical BID  . pantoprazole  40 mg Oral Daily  . polyethylene glycol  17 g Oral BID  . potassium chloride  40 mEq Oral BID  . senna  1 tablet Oral BID  . sodium chloride  1 spray Each Nare TID  . spironolactone  25 mg Oral Daily  . traZODone  50 mg Oral QHS  . warfarin  2 mg Oral q1800  . Warfarin - Pharmacist Dosing Inpatient   Does  not apply q1800   Continuous Infusions: . magnesium sulfate 1 - 4 g bolus IVPB     PRN Meds:.acetaminophen, HYDROcodone-acetaminophen, ipratropium-albuterol, ondansetron (ZOFRAN) IV  Antibiotics  :    Anti-infectives (From admission, onward)   None         Objective:   Vitals:   11/07/17 1954 11/08/17 0000 11/08/17 0406 11/08/17 0824  BP: (!) 136/58 (!) 143/67 (!) 121/52 (!) 126/57  Pulse: 90 90  98  Resp: 15 18 (!) 24 20  Temp: 98.3  F (36.8 C) 98.1 F (36.7 C) 98 F (36.7 C) 98.1 F (36.7 C)  TempSrc: Oral Axillary Oral Oral  SpO2: 99% 100% 99% 96%  Weight:   76.9 kg (169 lb 8.5 oz)   Height:        Wt Readings from Last 3 Encounters:  11/08/17 76.9 kg (169 lb 8.5 oz)  10/26/17 72.1 kg (159 lb)  01/21/17 74.8 kg (165 lb)     Intake/Output Summary (Last 24 hours) at 11/08/2017 1146 Last data filed at 11/08/2017 0900 Gross per 24 hour  Intake 809.92 ml  Output 1800 ml  Net -990.08 ml     Physical Exam  Awake Alert, Oriented X 3, No new F.N deficits, Normal affect [place].AT,PERRAL Supple Neck,No JVD, No cervical lymphadenopathy appriciated.  Symmetrical Chest wall movement, Good air movement bilaterally, few rales RRR,No Gallops, Rubs or new Murmurs, No Parasternal Heave +ve B.Sounds, Abd Soft, No tenderness, No organomegaly appriciated, No rebound - guarding or rigidity. No Cyanosis, Clubbing or edema, No new Rash or bruise   Data Review:    CBC Recent Labs  Lab 11/07/17 0217 11/07/17 0232 11/07/17 0409 11/08/17 0251  WBC 14.6*  --   --  12.2*  HGB 13.4 14.3 12.9 10.8*  HCT 41.3 42.0 38.0 31.8*  PLT 262  --   --  226  MCV 101.2*  --   --  96.7  MCH 32.8  --   --  32.8  MCHC 32.4  --   --  34.0  RDW 14.1  --   --  13.7  LYMPHSABS 2.8  --   --   --   MONOABS 0.5  --   --   --   EOSABS 0.2  --   --   --   BASOSABS 0.1  --   --   --     Chemistries  Recent Labs  Lab 11/07/17 0217 11/07/17 0232 11/07/17 0409 11/07/17 0418 11/08/17 0251  NA 135 132* 131*  --  138  K 5.1 5.0 7.9* 3.8 3.2*  CL 97* 99 98  --  95*  CO2 25  --   --   --  30  GLUCOSE 258* 254* 129*  --  100*  BUN 41* 64* 71*  --  38*  CREATININE 2.15* 2.10* 2.10*  --  1.99*  CALCIUM 9.2  --   --   --  9.2  MG  --   --   --  1.8 1.7   ------------------------------------------------------------------------------------------------------------------ No results for input(s): CHOL, HDL, LDLCALC, TRIG, CHOLHDL, LDLDIRECT in  the last 72 hours.  Lab Results  Component Value Date   HGBA1C 5.0 06/14/2016   ------------------------------------------------------------------------------------------------------------------ Recent Labs    11/07/17 0635  TSH 1.505   ------------------------------------------------------------------------------------------------------------------ No results for input(s): VITAMINB12, FOLATE, FERRITIN, TIBC, IRON, RETICCTPCT in the last 72 hours.  Coagulation profile Recent Labs  Lab 11/07/17 0635 11/08/17 0834  INR 2.12 2.48    No results  for input(s): DDIMER in the last 72 hours.  Cardiac Enzymes Recent Labs  Lab 11/07/17 1225 11/07/17 1928  TROPONINI 0.32* 0.33*   ------------------------------------------------------------------------------------------------------------------    Component Value Date/Time   BNP 491.3 (H) 11/07/2017 0219    Micro Results Recent Results (from the past 240 hour(s))  MRSA PCR Screening     Status: Abnormal   Collection Time: 11/07/17  6:39 AM  Result Value Ref Range Status   MRSA by PCR POSITIVE (A) NEGATIVE Final    Comment: RESULT CALLED TO, READ BACK BY AND VERIFIED WITH: RN Shela Commons [facility] [id number]        The GeneXpert MRSA Assay (FDA approved for NASAL specimens only), is one component of a comprehensive MRSA colonization surveillance program. It is not intended to diagnose MRSA infection nor to guide or monitor treatment for MRSA infections.     Radiology Reports Ct Chest High Resolution  Result Date: 11/04/2017 CLINICAL DATA:  3+ months of wheezing, coughing and hemoptysis. EXAM: CT CHEST WITHOUT CONTRAST TECHNIQUE: Multidetector CT imaging of the chest was performed following the standard protocol without intravenous contrast. High resolution imaging of the lungs, as well as inspiratory and expiratory imaging, was performed. COMPARISON:  12/14/2014. FINDINGS: Cardiovascular: Atherosclerotic calcification of the  arterial vasculature, including extensive three-vessel involvement of the coronary arteries. Aortic valve replacement. Heart is enlarged. No pericardial effusion. Mediastinum/Nodes: Mediastinal lymph nodes measure up to 9 mm in the low right paratracheal station, as before. Hilar regions are difficult to evaluate without IV contrast. No axillary adenopathy. Esophagus is grossly unremarkable. Moderate hiatal hernia. Lungs/Pleura: Image quality is degraded by respiratory motion and expiratory phase imaging. Scarring in the right middle lobe, lingula and both lower lobes. No definitive subpleural reticulation, traction bronchiectasis/bronchiolectasis, ground-glass, architectural distortion or honeycombing. No pleural fluid. Airway is unremarkable. No air trapping. Upper Abdomen: Visualized portion of the liver is unremarkable. A stone is seen in the gallbladder. Visualized portions of the adrenal glands are unremarkable. Partially imaged low-attenuation lesions in the kidneys bilaterally. Visualized portions of the spleen, pancreas, stomach and bowel are grossly unremarkable with exception of a moderate hiatal hernia. Upper abdominal lymph nodes are not enlarged by CT size criteria. Musculoskeletal: Degenerative changes in the spine. No worrisome lytic or sclerotic lesions. IMPRESSION: 1. Image quality is degraded by respiratory motion and expiratory phase imaging. No definitive evidence of interstitial lung disease. 2. Aortic atherosclerosis (ICD10-170.0). Extensive three-vessel coronary artery calcification. 3. Moderate hiatal hernia. 4. Cholelithiasis. Electronically Signed   By: Leanna Battles M.D.   On: 11/04/2017 07:49   Dg Chest Portable 1 View  Result Date: 11/07/2017 CLINICAL DATA:  84 year old female with respiratory distress. EXAM: PORTABLE CHEST 1 VIEW COMPARISON:  Chest CT dated 11/03/2017 FINDINGS: There is cardiomegaly with vascular congestion and mild edema. Small left pleural effusion and left  lung base atelectasis. Pneumonia is not excluded. There is no pneumothorax. Right pectoral pacemaker device and mechanical aortic valve. No acute osseous pathology. IMPRESSION: Cardiomegaly with findings of CHF and small left pleural effusion. Pneumonia is not excluded. Clinical correlation is recommended. Electronically Signed   By: Elgie Collard M.D.   On: 11/07/2017 02:32    Time Spent in minutes  30   Susa Raring M.D on 11/08/2017 at 11:46 AM  To page go to [email or web address] - password [email or web address]

## 2017-11-08 NOTE — Plan of Care (Signed)
Care plans reviewed and patient is progressing.  

## 2017-11-08 NOTE — Progress Notes (Signed)
PT Cancellation Note  Patient Details Name: KINDAL OHAGAN MRN: [id number] DOB: 04-24-1932   Cancelled Treatment:    Reason Eval/Treat Not Completed: Patient at procedure or test/unavailable((CT))   Laurina Bustle, PT, DPT Acute Rehabilitation Services  Pager: [phone]    Vanetta Mulders 11/08/2017, 10:20 AM

## 2017-11-09 LAB — BASIC METABOLIC PANEL
Anion gap: 11 (ref 5–15)
BUN: 49 mg/dL — AB (ref 8–23)
CHLORIDE: 94 mmol/L — AB (ref 98–111)
CO2: 30 mmol/L (ref 22–32)
CREATININE: 2.19 mg/dL — AB (ref 0.44–1.00)
Calcium: 8.8 mg/dL — ABNORMAL LOW (ref 8.9–10.3)
GFR calc Af Amer: 23 mL/min — ABNORMAL LOW (ref >60)
GFR, EST NON AFRICAN AMERICAN: 20 mL/min — AB (ref >60)
GLUCOSE: 94 mg/dL (ref 70–99)
POTASSIUM: 4.7 mmol/L (ref 3.5–5.1)
Sodium: 135 mmol/L (ref 135–145)

## 2017-11-09 LAB — PROTIME-INR
INR: 2.57
Prothrombin Time: 27.4 seconds — ABNORMAL HIGH (ref 11.4–15.2)

## 2017-11-09 LAB — POCT I-STAT, CHEM 8
BUN: 71 mg/dL — ABNORMAL HIGH (ref 8–23)
Calcium, Ion: 1.08 mmol/L — ABNORMAL LOW (ref 1.15–1.40)
Chloride: 98 mmol/L (ref 98–111)
Creatinine, Ser: 2.1 mg/dL — ABNORMAL HIGH (ref 0.44–1.00)
Glucose, Bld: 129 mg/dL — ABNORMAL HIGH (ref 70–99)
HCT: 38 % (ref 36.0–46.0)
Hemoglobin: 12.9 g/dL (ref 12.0–15.0)
Potassium: 7.9 mmol/L (ref 3.5–5.1)
SODIUM: 131 mmol/L — AB (ref 135–145)
TCO2: 31 mmol/L (ref 22–32)

## 2017-11-09 LAB — MAGNESIUM: MAGNESIUM: 1.8 mg/dL (ref 1.7–2.4)

## 2017-11-09 MED ORDER — BISACODYL 10 MG RE SUPP
10.0000 mg | Freq: Once | RECTAL | Status: AC
  Administered 2017-11-09: 10 mg via RECTAL
  Filled 2017-11-09: qty 1

## 2017-11-09 MED ORDER — PREDNISONE 20 MG PO TABS
40.0000 mg | ORAL_TABLET | Freq: Every day | ORAL | Status: DC
  Administered 2017-11-09 – 2017-11-12 (×4): 40 mg via ORAL
  Filled 2017-11-09 (×4): qty 2

## 2017-11-09 MED ORDER — IPRATROPIUM-ALBUTEROL 0.5-2.5 (3) MG/3ML IN SOLN
3.0000 mL | Freq: Four times a day (QID) | RESPIRATORY_TRACT | Status: DC
  Administered 2017-11-09 (×3): 3 mL via RESPIRATORY_TRACT
  Filled 2017-11-09 (×3): qty 3

## 2017-11-09 MED ORDER — IPRATROPIUM-ALBUTEROL 0.5-2.5 (3) MG/3ML IN SOLN
3.0000 mL | Freq: Three times a day (TID) | RESPIRATORY_TRACT | Status: DC
  Administered 2017-11-10 – 2017-11-11 (×4): 3 mL via RESPIRATORY_TRACT
  Filled 2017-11-09 (×4): qty 3

## 2017-11-09 NOTE — Progress Notes (Signed)
ANTICOAGULATION CONSULT NOTE - Follow Up Consult  Pharmacy Consult for Warfarin  Indication: atrial fibrillation  Patient Measurements: Height: 4\' 10"  (147.3 cm) Weight: 162 lb 4.1 oz (73.6 kg) IBW/kg (Calculated) : 40.9 Vital Signs: Temp: 98.4 F (36.9 C) (07/22 0830) Temp Source: Oral (07/22 0830) BP: 140/59 (07/22 0830) Pulse Rate: 69 (07/22 0830)  Labs: Recent Labs    11/07/17 0217 11/07/17 0232 11/07/17 0409 11/07/17 0635 11/07/17 1225 11/07/17 1928 11/08/17 0251 11/08/17 0834 11/09/17 0417  HGB 13.4 14.3 12.9  --   --   --  10.8*  --   --   HCT 41.3 42.0 38.0  --   --   --  31.8*  --   --   PLT 262  --   --   --   --   --  226  --   --   LABPROT  --   --   --  23.6*  --   --   --  26.6* 27.4*  INR  --   --   --  2.12  --   --   --  2.48 2.57  CREATININE 2.15* 2.10* 2.10*  --   --   --  1.99*  --  2.19*  TROPONINI  --   --   --   --  0.32* 0.33*  --   --   --     Estimated Creatinine Clearance: 16.6 mL/min (A) (by C-G formula based on SCr of 2.19 mg/dL (H)).   Medical History: Past Medical History:  Diagnosis Date  . Acute on chronic renal failure [facility])    sees Dr Allena Katz   . Anemia    Acute blood loss  . Aortic regurgitation   . Aortic stenosis 10/13/2012   Low EF, low gradient with severe aortic stenosis confirmed by dobutamine stress echocardiogram s/p TAVR 12/2012  . Asthma   . Atrial fibrillation (HCC)    tachy-brady syndrome with <1% recurrent PAF since pacemaker placement  . Cataracts, bilateral   . Chronic combined systolic and diastolic CHF (congestive heart failure) (HCC)   . Chronic lower back pain   . CKD (chronic kidney disease)   . Coronary artery disease involving native coronary artery of native heart   . Dementia    Without behavioral disturbance  . Dysrhythmia   . Fibromyalgia   . Gastroesophageal reflux disease   . H/O dizziness   . H/O urinary frequency   . H/O: stroke   . Hard of hearing   . Headache    hx of migraines   .  Heart murmur   . History of blood transfusion   . History of bronchitis   . History of kidney stones   . History of urinary tract infection   . HLD (hyperlipidemia)   . HTN (hypertension)   . Hypothyroidism   . Neuropathy   . Nonischemic cardiomyopathy (HCC)   . On home oxygen therapy    patient uses at nite- 2L- has not used in > 6 months per patient  . Osteoarthritis   . Osteoporosis   . Pneumonia    hx of x 3   . PONV (postoperative nausea and vomiting)   . Presence of permanent cardiac pacemaker   . Pulmonary embolism (HCC)    HISTORY OF, the pt. had a recurrent bilateral pulmonary emboli in 2005, on warfarin therapy and at which time she under went implantation of IVC filter  . Repeated falls   . Rupture  of right patellar tendon   . Sciatica   . Shortness of breath dyspnea    with exertion   . Sleep apnea    uses oxygen at night and PRN- not used since > 6 months / DOES NOT USE  C-PAP  . Spinal stenosis   . Stroke (HCC)   . Symptomatic bradycardia   . Symptomatic bradycardia 2012   s/p [employer] PPM  . Syncope   . Urinary incontinence   . Urinary urgency     Assessment: 84 y/o F here with shortness of breath, and volume overload being treated for HF responding to IV furosemide.  On warfarin PTA 2mg  daily for afib, also hx of PE.  INR at goal on admit.  INR today remains stable at 2.57. H/H down. Plt wnl. No significant drug interactions noted.   Goal of Therapy:  INR 2-3 Monitor platelets by anticoagulation protocol: Yes   Plan:  Warfarin dose of 2 mg daily  Monitor daily INR, CBC  Thank you for involving pharmacy in this patient's care.  Vinnie Level, PharmD., BCPS Clinical Pharmacist Clinical phone for 11/09/17 until 3:30pm: [phone] If after 3:30pm, please refer to [facility] for unit-specific pharmacist

## 2017-11-09 NOTE — Progress Notes (Addendum)
PROGRESS NOTE    [name]  FMB:[id number] DOB: 1932/12/13 DOA: 11/07/2017 PCP: Pearson Grippe, MD    Brief Narrative: Brief Narrative  Jodi B Mitchellis a 84 y.o.femalewith medical history significant ofHTN, HLD,oxygen dependent chronic respiratory failure, CKD 4, paroxysmalA. fib, chronic combined systolic and diastolicCHF last EF 35-40% with grade 2DD., fibromyalgia, dementia, nonischemic cardiomyopathy, DVT/PE, symptomatic bradycardias/p PMplacement, CVA, OSA, hypothyroidism;who presented from her nursing facility with acute respiratory distress. History is limited due to patient condition. En route with EMS patient was noted to have rales in all lung fields and placed on CPAPwithO2 saturations noted to be in the 80s.  She was admitted for acute on chronic hypoxic respiratory failure due to acute on chronic CHF.   Diuresis she is much improved, now she is complaining of acute on chronic low back pain which she says has gotten worse over the last week, since she has a pacemaker CT scan T and L-spine stat has been ordered.  Kindly monitor.  She has chronic light lower extremity weakness which is unchanged, she does not have any cauda equina-like symptoms.   Assessment & Plan:   Active Problems:   S/P TAVR (transcatheter aortic valve replacement)   Dementia without behavioral disturbance   Hypothyroidism   Acute on chronic systolic congestive heart failure (HCC)   Acute on chronic respiratory failure with hypoxia (HCC)   Elevated troponin   Acute kidney injury superimposed on chronic kidney disease (HCC)   1-Acute on chronic Hypoxic Respiratory Failure; secondary to acute on chronic combine systolic, diastolic HF  -on IV lasix. Had recent CT chest which was negative for interstitial lung diseases.  -negative 1.8 L. Urine out put 7-21; 1.1 L.  -weight;170-------169------- 162---- -on carvedilol, Imdur,  -hold spironolactone.  -continue with IV lasix. Cr stable. Last  cr July 2018 was at 2.1.  -still with SOB, continue with Lasix, schedule nebulizer.  -ECHO worsening EF, TAVR in place, gradiant slightly worse, AI worse. Await cardiology rec.   AKI on CKD stage IV;  Cr baseline 1.8--2. Last cr per records July 2018 was at 2.1.  Cr increasing 2.1----1.9---2.19 Hold spironolactone.  Monitor on lasix.   Acute on chronic back pain, peripheral neuropathy;  CT spine thoracic and Lumbar; No acute findings. Mild spondylosis of the thoracic spine. Multilevel disc disease and multilevel neural foraminal narrowing as described. Mild biphasic curvature. Minimal canal stenosis at the T11-12 level.  Lumbar; Moderate spondylosis throughout the lumbar spine with moderate multilevel degenerative disc disease. Varying degrees of canal stenosis at several levels as described. Varying degrees of neural foraminal narrowing at multiple levels worse at the L4-5 and L5-S1 levels. Minimal curvature convex right. -PT evaluation.  -neurosurgery consulted -prednisone trial.   Paroxysmal A. Fib;  Continue with amiodarone and coumadin.  Continue with carvedilol.   HLD;  Continue with statins.   Hypothyroidism; continue with synthroid.   Sinus syndrome; S/P pacemaker.   Constipation;  Gout; continue with allopurinol.  Had small BM yesterday.  Needs suppository today     DVT prophylaxis: Coumadin  Code Status: full code.  Family Communication: care discussed with patient.  Disposition Plan: awaiting respiratory to improved, PT evaluation.   Consultants:   Cardiology    Procedures:  ECHO; - LV function appears worse from prior. TAVR in place, with   gradients slightly elevated from prior (peak 18, mean 11, AVA    1.91). Valvular AI also worse compared to prior. EF 30 %    Antimicrobials:  none  Subjective: She needs pain medication for back pain.  Had very small BM yesterday.  She is still having SOB, some improvement since admission.    Objective: Vitals:   11/08/17 1842 11/08/17 1932 11/09/17 0039 11/09/17 0304  BP: (!) 147/68 (!) 118/55 (!) 136/58 140/66  Pulse: 83 73 66 80  Resp:  17 17 19   Temp:  98 F (36.7 C) 97.6 F (36.4 C) 97.8 F (36.6 C)  TempSrc:  Oral Oral Oral  SpO2:  100% 95% 95%  Weight:    73.6 kg (162 lb 4.1 oz)  Height:        Intake/Output Summary (Last 24 hours) at 11/09/2017 0733 Last data filed at 11/09/2017 0233 Gross per 24 hour  Intake 650 ml  Output 1100 ml  Net -450 ml   Filed Weights   11/07/17 0651 11/08/17 0406 11/09/17 0304  Weight: 77.5 kg (170 lb 13.7 oz) 76.9 kg (169 lb 8.5 oz) 73.6 kg (162 lb 4.1 oz)    Examination:  General exam: Appears calm and comfortable  Respiratory system: Normal respiratory effort, sporadic expiratory wheezing, Bilateral crackles.  Cardiovascular system: S1 & S2 heard, RRR.  Gastrointestinal system: Abdomen is nondistended, soft and nontender. No organomegaly or masses felt. Normal bowel sounds heard. Central nervous system: Alert and oriented. No focal neurological deficits. Other than right chronic LE weakness after multiples sx. Hard of hearing.  Extremities: no edema, right LE chronic weakness.  Skin: No rashes, lesions or ulcers Psychiatry: Mood & affect appropriate.     Data Reviewed: I have personally reviewed following labs and imaging studies  CBC: Recent Labs  Lab 11/07/17 0217 11/07/17 0232 11/07/17 0409 11/08/17 0251  WBC 14.6*  --   --  12.2*  NEUTROABS 11.0*  --   --   --   HGB 13.4 14.3 12.9 10.8*  HCT 41.3 42.0 38.0 31.8*  MCV 101.2*  --   --  96.7  PLT 262  --   --  226   Basic Metabolic Panel: Recent Labs  Lab 11/07/17 0217 11/07/17 0232 11/07/17 0409 11/07/17 0418 11/08/17 0251 11/09/17 0417  NA 135 132* 131*  --  138 135  K 5.1 5.0 7.9* 3.8 3.2* 4.7  CL 97* 99 98  --  95* 94*  CO2 25  --   --   --  30 30  GLUCOSE 258* 254* 129*  --  100* 94  BUN 41* 64* 71*  --  38* 49*  CREATININE 2.15* 2.10*  2.10*  --  1.99* 2.19*  CALCIUM 9.2  --   --   --  9.2 8.8*  MG  --   --   --  1.8 1.7 1.8   GFR: Estimated Creatinine Clearance: 16.6 mL/min (A) (by C-G formula based on SCr of 2.19 mg/dL (H)). Liver Function Tests: No results for input(s): AST, ALT, ALKPHOS, BILITOT, PROT, ALBUMIN in the last 168 hours. No results for input(s): LIPASE, AMYLASE in the last 168 hours. No results for input(s): AMMONIA in the last 168 hours. Coagulation Profile: Recent Labs  Lab 11/07/17 0635 11/08/17 0834 11/09/17 0417  INR 2.12 2.48 2.57   Cardiac Enzymes: Recent Labs  Lab 11/07/17 1225 11/07/17 1928  TROPONINI 0.32* 0.33*   BNP (last 3 results) No results for input(s): PROBNP in the last 8760 hours. HbA1C: No results for input(s): HGBA1C in the last 72 hours. CBG: No results for input(s): GLUCAP in the last 168 hours. Lipid Profile: No results for input(s): CHOL,  HDL, LDLCALC, TRIG, CHOLHDL, LDLDIRECT in the last 72 hours. Thyroid Function Tests: Recent Labs    11/07/17 0635  TSH 1.505   Anemia Panel: No results for input(s): VITAMINB12, FOLATE, FERRITIN, TIBC, IRON, RETICCTPCT in the last 72 hours. Sepsis Labs: Recent Labs  Lab 11/07/17 0402  LATICACIDVEN 1.56    Recent Results (from the past 240 hour(s))  MRSA PCR Screening     Status: Abnormal   Collection Time: 11/07/17  6:39 AM  Result Value Ref Range Status   MRSA by PCR POSITIVE (A) NEGATIVE Final    Comment: RESULT CALLED TO, READ BACK BY AND VERIFIED WITH: RN Shela Commons [facility] [id number]        The GeneXpert MRSA Assay (FDA approved for NASAL specimens only), is one component of a comprehensive MRSA colonization surveillance program. It is not intended to diagnose MRSA infection nor to guide or monitor treatment for MRSA infections.          Radiology Studies: Ct Thoracic Spine Wo Contrast  Result Date: 11/08/2017 CLINICAL DATA:  Chronic diffuse thoracic and lumbar pain. No injury. EXAM: CT THORACIC AND  LUMBAR SPINE WITHOUT CONTRAST TECHNIQUE: Multidetector CT imaging of the thoracic and lumbar spine was performed without contrast. Multiplanar CT image reconstructions were also generated. COMPARISON:  Chest CT 11/03/2017 and 12/14/2014 as well as abdominopelvic CT 11/10/2012. FINDINGS: CT THORACIC SPINE FINDINGS Alignment: Subtle biphasic curvature with primary curvature convex right and secondary curvature convex left. No subluxation. Vertebrae: Vertebral body heights are maintained. There is mild spondylosis throughout the thoracic spine. Minimal right-sided neural foraminal narrowing at the T3-4 level. Mild right-sided neural foraminal narrowing at the T9-10 level. Mild bilateral neural from narrowing at the T10-11 and T11-12 levels. Minimal canal stenosis at the T11-12 level due to posterior aspect formation and facet arthropathy. No evidence of compression fracture or subluxation. Paraspinal and other soft tissues: Paravertebral soft tissues are within normal. Calcified plaque over the thoracic aorta. Prosthetic aortic valve is present. Cardiomegaly. Mild bibasilar atelectasis and tiny amount right pleural fluid. Small hiatal hernia. Calcified plaque over the abdominal aorta. Small cyst over the upper pole right kidney. Disc levels: Moderate disc space narrowing at the T3-4 and T5-6 levels with mild disc space narrowing throughout the remainder of the thoracic spine. CT LUMBAR SPINE FINDINGS Segmentation: 5 lumbar type vertebrae. Alignment: Slight curvature convex right. Vertebrae: Vertebral body heights are normal. There is moderate spondylosis throughout the lumbar spine. There is no compression fracture or spondylolisthesis. Prominent left paravertebral osteophyte at the L2-3 level. L1-2 level demonstrates mild broad-based disc bulge. Moderate facet arthropathy throughout the lumbar spine. L2-3 level demonstrates mild canal stenosis due to broad-based disc bulge, facet arthropathy and minimal ligamentum  flavum hypertrophy. L3-4 level demonstrates mild broad-based disc bulge. There is mild narrowing of the canal in the transverse dimension. Mild bilateral neural foraminal narrowing. L4-5 level demonstrates mild broad-based disc bulge. Borderline canal stenosis. Severe bilateral neural foraminal narrowing right worse than left. L5-S1 level demonstrates broad-based disc bulge with minimal canal stenosis. Moderate to severe bilateral neural foraminal narrowing. Paraspinal and other soft tissues: Moderate calcified plaque over the abdominal aorta. Small cyst over the upper pole and midpole right kidney. IVC filter is present. Diverticulosis of the colon. Disc levels: Moderate disc space narrowing at all levels of the lumbar spine with vacuum disc phenomenon. IMPRESSION: CT THORACIC SPINE IMPRESSION No acute findings. Mild spondylosis of the thoracic spine. Multilevel disc disease and multilevel neural foraminal narrowing as described. Mild biphasic curvature.  Minimal canal stenosis at the T11-12 level. CT LUMBAR SPINE IMPRESSION No acute findings. Moderate spondylosis throughout the lumbar spine with moderate multilevel degenerative disc disease. Varying degrees of canal stenosis at several levels as described. Varying degrees of neural foraminal narrowing at multiple levels worse at the L4-5 and L5-S1 levels. Minimal curvature convex right. Other ancillary findings over the thorax and abdomen/pelvis as described including aortic atherosclerosis, right renal cysts and bibasilar atelectasis as well as small amount right pleural fluid. Electronically Signed   By: Elberta Fortis M.D.   On: 11/08/2017 11:47   Ct Lumbar Spine Wo Contrast  Result Date: 11/08/2017 CLINICAL DATA:  Chronic diffuse thoracic and lumbar pain. No injury. EXAM: CT THORACIC AND LUMBAR SPINE WITHOUT CONTRAST TECHNIQUE: Multidetector CT imaging of the thoracic and lumbar spine was performed without contrast. Multiplanar CT image reconstructions were  also generated. COMPARISON:  Chest CT 11/03/2017 and 12/14/2014 as well as abdominopelvic CT 11/10/2012. FINDINGS: CT THORACIC SPINE FINDINGS Alignment: Subtle biphasic curvature with primary curvature convex right and secondary curvature convex left. No subluxation. Vertebrae: Vertebral body heights are maintained. There is mild spondylosis throughout the thoracic spine. Minimal right-sided neural foraminal narrowing at the T3-4 level. Mild right-sided neural foraminal narrowing at the T9-10 level. Mild bilateral neural from narrowing at the T10-11 and T11-12 levels. Minimal canal stenosis at the T11-12 level due to posterior aspect formation and facet arthropathy. No evidence of compression fracture or subluxation. Paraspinal and other soft tissues: Paravertebral soft tissues are within normal. Calcified plaque over the thoracic aorta. Prosthetic aortic valve is present. Cardiomegaly. Mild bibasilar atelectasis and tiny amount right pleural fluid. Small hiatal hernia. Calcified plaque over the abdominal aorta. Small cyst over the upper pole right kidney. Disc levels: Moderate disc space narrowing at the T3-4 and T5-6 levels with mild disc space narrowing throughout the remainder of the thoracic spine. CT LUMBAR SPINE FINDINGS Segmentation: 5 lumbar type vertebrae. Alignment: Slight curvature convex right. Vertebrae: Vertebral body heights are normal. There is moderate spondylosis throughout the lumbar spine. There is no compression fracture or spondylolisthesis. Prominent left paravertebral osteophyte at the L2-3 level. L1-2 level demonstrates mild broad-based disc bulge. Moderate facet arthropathy throughout the lumbar spine. L2-3 level demonstrates mild canal stenosis due to broad-based disc bulge, facet arthropathy and minimal ligamentum flavum hypertrophy. L3-4 level demonstrates mild broad-based disc bulge. There is mild narrowing of the canal in the transverse dimension. Mild bilateral neural foraminal  narrowing. L4-5 level demonstrates mild broad-based disc bulge. Borderline canal stenosis. Severe bilateral neural foraminal narrowing right worse than left. L5-S1 level demonstrates broad-based disc bulge with minimal canal stenosis. Moderate to severe bilateral neural foraminal narrowing. Paraspinal and other soft tissues: Moderate calcified plaque over the abdominal aorta. Small cyst over the upper pole and midpole right kidney. IVC filter is present. Diverticulosis of the colon. Disc levels: Moderate disc space narrowing at all levels of the lumbar spine with vacuum disc phenomenon. IMPRESSION: CT THORACIC SPINE IMPRESSION No acute findings. Mild spondylosis of the thoracic spine. Multilevel disc disease and multilevel neural foraminal narrowing as described. Mild biphasic curvature. Minimal canal stenosis at the T11-12 level. CT LUMBAR SPINE IMPRESSION No acute findings. Moderate spondylosis throughout the lumbar spine with moderate multilevel degenerative disc disease. Varying degrees of canal stenosis at several levels as described. Varying degrees of neural foraminal narrowing at multiple levels worse at the L4-5 and L5-S1 levels. Minimal curvature convex right. Other ancillary findings over the thorax and abdomen/pelvis as described including aortic atherosclerosis, right  renal cysts and bibasilar atelectasis as well as small amount right pleural fluid. Electronically Signed   By: Elberta Fortis M.D.   On: 11/08/2017 11:47        Scheduled Meds: . allopurinol  100 mg Oral Daily  . amiodarone  100 mg Oral Daily  . atorvastatin  10 mg Oral q1800  . bisacodyl  10 mg Rectal Daily  . calcitRIOL  0.25 mcg Oral Daily  . carvedilol  3.125 mg Oral BID WC  . cycloSPORINE  1 drop Both Eyes BID  . donepezil  10 mg Oral QHS  . DULoxetine  60 mg Oral q1800  . ferrous sulfate  325 mg Oral Q breakfast  . furosemide  80 mg Intravenous BID  . gabapentin  200 mg Oral BID  . isosorbide mononitrate  30 mg  Oral Daily  . levothyroxine  100 mcg Oral QAC breakfast  . linaclotide  145 mcg Oral Daily  . mouth rinse  15 mL Mouth Rinse BID  . Melatonin  3 mg Oral QHS  . MUSCLE RUB  1 application Topical BID  . pantoprazole  40 mg Oral Daily  . polyethylene glycol  17 g Oral BID  . potassium chloride  40 mEq Oral BID  . senna  1 tablet Oral BID  . sodium chloride  1 spray Each Nare TID  . spironolactone  25 mg Oral Daily  . traZODone  50 mg Oral QHS  . warfarin  2 mg Oral q1800  . Warfarin - Pharmacist Dosing Inpatient   Does not apply q1800   Continuous Infusions:   LOS: 2 days    Time spent: 35 minutes.     Alba Cory, MD [facility] Pager [phone]  If 7PM-7AM, please contact night-coverage [email or web address] Password [facility] 11/09/2017, 7:33 AM

## 2017-11-09 NOTE — [place] FL2 (Signed)
[place] MEDICAID FL2 LEVEL OF CARE SCREENING TOOL     IDENTIFICATION  Patient Name: [name] Birthdate: 1932-06-12 Sex: female Admission Date (Current Location): 11/07/2017  [facility] and [place] Number:  Producer, television/film/video and Address:  [facility] [place], [address] [place], [place] [address]      Provider Number: [id number]  Attending Physician Name and Address:  Alba Cory, MD  Relative Name and Phone Number:       Current Level of Care: Hospital Recommended Level of Care: Skilled Nursing Facility Prior Approval Number:    Date Approved/Denied:   PASRR Number: [id number]  Discharge Plan: SNF    Current Diagnoses: Patient Active Problem List   Diagnosis Date Noted  . Elevated troponin 11/07/2017  . Acute kidney injury superimposed on chronic kidney disease (HCC) 11/07/2017  . Anticoagulation management encounter 09/29/2016  . Respiratory failure (HCC) 07/21/2016  . Acute on chronic respiratory failure with hypoxia (HCC) 07/21/2016  . Head contusion 07/15/2016  . Fall 07/14/2016  . Chest pain at rest   . Acute on chronic systolic congestive heart failure (HCC)   . Unstable angina (HCC)   . Acute respiratory failure (HCC) 06/13/2016  . Elevated blood sugar 06/13/2016  . Dementia without behavioral disturbance 01/09/2016  . Chronic constipation 01/09/2016  . Neuropathic pain 01/09/2016  . Esophageal reflux 01/09/2016  . Hypothyroidism 01/09/2016  . CKD (chronic kidney disease) stage 4, GFR 15-29 ml/min (HCC) 01/09/2016  . Right patellar tendon rupture 06/25/2015  . Avulsion of right patellar tendon 03/08/2015  . Aspiration pneumonia (HCC) 12/14/2014  . Anemia of chronic disease 12/14/2014  . Aspiration into airway 12/14/2014  . Swallowing dysfunction 12/14/2014  . S/P right TKA 12/04/2014  . S/P knee replacement 12/04/2014  . Acute blood loss anemia 12/28/2012  . Thrombocytopenia (HCC) 12/28/2012  . Toe fracture  12/28/2012  . CKD (chronic kidney disease), stage III (HCC) 12/28/2012  . Low back pain 12/28/2012  . Wound dehiscence, surgical 12/28/2012  . Atelectasis 12/28/2012  . S/P TAVR (transcatheter aortic valve replacement) 12/21/2012  . Severe aortic stenosis 12/06/2012  . Aortic stenosis 10/13/2012  . Acute on chronic systolic and diastolic heart failure, NYHA class 3 (HCC) 10/13/2012  . Pacemaker 10/15/2010  . Paroxysmal atrial fibrillation (HCC) 10/15/2010  . Dyslipidemia 10/15/2010  . Hypertension 10/15/2010  . Chronic diastolic heart failure (HCC) 10/15/2010    Orientation RESPIRATION BLADDER Height & Weight     Self, Time, Situation, Place  O2(Nasal Canula 2 L) Continent, External catheter Weight: 162 lb 4.1 oz (73.6 kg) Height:  4\' 10"  (147.3 cm)  BEHAVIORAL SYMPTOMS/MOOD NEUROLOGICAL BOWEL NUTRITION STATUS  (None) (Dementia) Continent Diet(Heart healthy. Fluid restriction 1500 mL.)  AMBULATORY STATUS COMMUNICATION OF NEEDS Skin   Limited Assist Verbally Normal                       Personal Care Assistance Level of Assistance              Functional Limitations Info  Sight, Hearing, Speech Sight Info: Adequate Hearing Info: Adequate Speech Info: Adequate    SPECIAL CARE FACTORS FREQUENCY  PT (By licensed PT)     PT Frequency: 5 x week              Contractures Contractures Info: Not present    Additional Factors Info  Code Status, Allergies, Isolation Precautions Code Status Info: Full Allergies Info: Nubain (Nalbuphine Hcl), Codeine, Darvocet (Propoxyphene N-acetaminophen).  Isolation Precautions Info: Contact: MRSA     Current Medications (11/09/2017):  This is the current hospital active medication list Current Facility-Administered Medications  Medication Dose Route Frequency Provider Last Rate Last Dose  . acetaminophen (TYLENOL) tablet 650 mg  650 mg Oral Q4H PRN Madelyn Flavors A, MD   650 mg at 11/08/17 1903  . allopurinol (ZYLOPRIM)  tablet 100 mg  100 mg Oral Daily Katrinka Blazing, Rondell A, MD   100 mg at 11/09/17 0837  . amiodarone (PACERONE) tablet 100 mg  100 mg Oral Daily Katrinka Blazing, Rondell A, MD   100 mg at 11/09/17 0836  . atorvastatin (LIPITOR) tablet 10 mg  10 mg Oral q1800 Madelyn Flavors A, MD   10 mg at 11/08/17 1842  . calcitRIOL (ROCALTROL) capsule 0.25 mcg  0.25 mcg Oral Daily Madelyn Flavors A, MD   0.25 mcg at 11/09/17 0836  . carvedilol (COREG) tablet 3.125 mg  3.125 mg Oral BID WC Leroy Sea, MD   3.125 mg at 11/09/17 0835  . cycloSPORINE (RESTASIS) 0.05 % ophthalmic emulsion 1 drop  1 drop Both Eyes BID Madelyn Flavors A, MD   1 drop at 11/09/17 0838  . donepezil (ARICEPT) tablet 10 mg  10 mg Oral QHS Smith, Rondell A, MD   10 mg at 11/08/17 2313  . DULoxetine (CYMBALTA) DR capsule 60 mg  60 mg Oral q1800 Madelyn Flavors A, MD   60 mg at 11/08/17 1841  . ferrous sulfate tablet 325 mg  325 mg Oral Q breakfast Madelyn Flavors A, MD   325 mg at 11/09/17 0835  . furosemide (LASIX) injection 80 mg  80 mg Intravenous BID Madelyn Flavors A, MD   80 mg at 11/09/17 1610  . gabapentin (NEURONTIN) capsule 200 mg  200 mg Oral BID Madelyn Flavors A, MD   200 mg at 11/09/17 0835  . HYDROcodone-acetaminophen (NORCO/VICODIN) 5-325 MG per tablet 1 tablet  1 tablet Oral Q4H PRN Leroy Sea, MD   1 tablet at 11/09/17 1401  . ipratropium-albuterol (DUONEB) 0.5-2.5 (3) MG/3ML nebulizer solution 3 mL  3 mL Nebulization Q6H Regalado, Belkys A, MD   3 mL at 11/09/17 1342  . isosorbide mononitrate (IMDUR) 24 hr tablet 30 mg  30 mg Oral Daily Leroy Sea, MD   30 mg at 11/09/17 0835  . levothyroxine (SYNTHROID, LEVOTHROID) tablet 100 mcg  100 mcg Oral QAC breakfast Madelyn Flavors A, MD   100 mcg at 11/09/17 0837  . linaclotide (LINZESS) capsule 145 mcg  145 mcg Oral Daily Madelyn Flavors A, MD   145 mcg at 11/09/17 0843  . MEDLINE mouth rinse  15 mL Mouth Rinse BID Leroy Sea, MD   15 mL at 11/09/17 9604  . Melatonin TABS 3 mg   3 mg Oral QHS Smith, Rondell A, MD   3 mg at 11/08/17 2259  . MUSCLE RUB CREA 1 application  1 application Topical BID Clydie Braun, MD   1 application at 11/09/17 [phone]  . ondansetron (ZOFRAN) injection 4 mg  4 mg Intravenous Q6H PRN Smith, Rondell A, MD      . pantoprazole (PROTONIX) EC tablet 40 mg  40 mg Oral Daily Katrinka Blazing, Rondell A, MD   40 mg at 11/09/17 0837  . polyethylene glycol (MIRALAX / GLYCOLAX) packet 17 g  17 g Oral BID Leroy Sea, MD   17 g at 11/09/17 [phone]  . predniSONE (DELTASONE) tablet 40 mg  40 mg Oral Q breakfast Regalado,  Belkys A, MD   40 mg at 11/09/17 0834  . senna (SENOKOT) tablet 8.6 mg  1 tablet Oral BID Madelyn Flavors A, MD   8.6 mg at 11/09/17 0836  . sodium chloride (OCEAN) 0.65 % nasal spray 1 spray  1 spray Each Nare TID Clydie Braun, MD   1 spray at 11/09/17 0839  . traZODone (DESYREL) tablet 50 mg  50 mg Oral QHS Smith, Rondell A, MD   50 mg at 11/08/17 2259  . warfarin (COUMADIN) tablet 2 mg  2 mg Oral q1800 Leroy Sea, MD   2 mg at 11/08/17 1842  . Warfarin - Pharmacist Dosing Inpatient   Does not apply q1800 Leroy Sea, MD       Facility-Administered Medications Ordered in Other Encounters  Medication Dose Route Frequency Provider Last Rate Last Dose  . sodium bicarbonate first hour bolus via infusion   Intravenous Once Pearson Grippe, MD         Discharge Medications: Please see discharge summary for a list of discharge medications.  Relevant Imaging Results:  Relevant Lab Results:   Additional Information SS#: [id number]  Margarito Liner, LCSW

## 2017-11-09 NOTE — Progress Notes (Signed)
Progress Note  Patient Name: [name] Date of Encounter: 11/09/2017  Primary Cardiologist: Tonny Bollman, MD   Subjective   Overall no chest pain, shortness of breath continues to improve.  Now back on low-dose beta-blocker.  Receive Zaroxolyn yesterday.  Recent back pain.  Chronic right lower extremity weakness.  Inpatient Medications    Scheduled Meds: . allopurinol  100 mg Oral Daily  . amiodarone  100 mg Oral Daily  . atorvastatin  10 mg Oral q1800  . calcitRIOL  0.25 mcg Oral Daily  . carvedilol  3.125 mg Oral BID WC  . cycloSPORINE  1 drop Both Eyes BID  . donepezil  10 mg Oral QHS  . DULoxetine  60 mg Oral q1800  . ferrous sulfate  325 mg Oral Q breakfast  . furosemide  80 mg Intravenous BID  . gabapentin  200 mg Oral BID  . ipratropium-albuterol  3 mL Nebulization Q6H  . isosorbide mononitrate  30 mg Oral Daily  . levothyroxine  100 mcg Oral QAC breakfast  . linaclotide  145 mcg Oral Daily  . mouth rinse  15 mL Mouth Rinse BID  . Melatonin  3 mg Oral QHS  . MUSCLE RUB  1 application Topical BID  . pantoprazole  40 mg Oral Daily  . polyethylene glycol  17 g Oral BID  . predniSONE  40 mg Oral Q breakfast  . senna  1 tablet Oral BID  . sodium chloride  1 spray Each Nare TID  . traZODone  50 mg Oral QHS  . warfarin  2 mg Oral q1800  . Warfarin - Pharmacist Dosing Inpatient   Does not apply q1800   Continuous Infusions:  PRN Meds: acetaminophen, HYDROcodone-acetaminophen, ondansetron (ZOFRAN) IV   Vital Signs    Vitals:   11/09/17 0039 11/09/17 0304 11/09/17 0830 11/09/17 0937  BP: (!) 136/58 140/66 (!) 140/59   Pulse: 66 80 69   Resp: 17 19 17    Temp: 97.6 F (36.4 C) 97.8 F (36.6 C) 98.4 F (36.9 C)   TempSrc: Oral Oral Oral   SpO2: 95% 95% 99% 95%  Weight:  162 lb 4.1 oz (73.6 kg)    Height:        Intake/Output Summary (Last 24 hours) at 11/09/2017 1002 Last data filed at 11/09/2017 0233 Gross per 24 hour  Intake 290 ml  Output 1100  ml  Net -810 ml   Filed Weights   11/07/17 0651 11/08/17 0406 11/09/17 0304  Weight: 170 lb 13.7 oz (77.5 kg) 169 lb 8.5 oz (76.9 kg) 162 lb 4.1 oz (73.6 kg)    Telemetry    No adverse arrhythmias- Personally Reviewed  ECG    No new- Personally Reviewed  Physical Exam   GEN: No acute distress.   Neck: No JVD Cardiac: RRR, no murmurs, rubs, or gallops.  Respiratory: Clear to auscultation bilaterally. GI: Soft, nontender, non-distended  MS: No edema; No deformity. Neuro:  Nonfocal  Psych: Normal affect   Labs    Chemistry Recent Labs  Lab 11/07/17 0217  11/07/17 0409 11/07/17 0418 11/08/17 0251 11/09/17 0417  NA 135   < > 131*  --  138 135  K 5.1   < > 7.9* 3.8 3.2* 4.7  CL 97*   < > 98  --  95* 94*  CO2 25  --   --   --  30 30  GLUCOSE 258*   < > 129*  --  100* 94  BUN  41*   < > 71*  --  38* 49*  CREATININE 2.15*   < > 2.10*  --  1.99* 2.19*  CALCIUM 9.2  --   --   --  9.2 8.8*  GFRNONAA 20*  --   --   --  22* 20*  GFRAA 23*  --   --   --  26* 23*  ANIONGAP 13  --   --   --  13 11   < > = values in this interval not displayed.     Hematology Recent Labs  Lab 11/07/17 0217 11/07/17 0232 11/07/17 0409 11/08/17 0251  WBC 14.6*  --   --  12.2*  RBC 4.08  --   --  3.29*  HGB 13.4 14.3 12.9 10.8*  HCT 41.3 42.0 38.0 31.8*  MCV 101.2*  --   --  96.7  MCH 32.8  --   --  32.8  MCHC 32.4  --   --  34.0  RDW 14.1  --   --  13.7  PLT 262  --   --  226    Cardiac Enzymes Recent Labs  Lab 11/07/17 1225 11/07/17 1928  TROPONINI 0.32* 0.33*    Recent Labs  Lab 11/07/17 0230  TROPIPOC 0.16*     BNP Recent Labs  Lab 11/07/17 0219  BNP 491.3*     DDimer No results for input(s): DDIMER in the last 168 hours.   Radiology    Ct Thoracic Spine Wo Contrast  Result Date: 11/08/2017 CLINICAL DATA:  Chronic diffuse thoracic and lumbar pain. No injury. EXAM: CT THORACIC AND LUMBAR SPINE WITHOUT CONTRAST TECHNIQUE: Multidetector CT imaging of the  thoracic and lumbar spine was performed without contrast. Multiplanar CT image reconstructions were also generated. COMPARISON:  Chest CT 11/03/2017 and 12/14/2014 as well as abdominopelvic CT 11/10/2012. FINDINGS: CT THORACIC SPINE FINDINGS Alignment: Subtle biphasic curvature with primary curvature convex right and secondary curvature convex left. No subluxation. Vertebrae: Vertebral body heights are maintained. There is mild spondylosis throughout the thoracic spine. Minimal right-sided neural foraminal narrowing at the T3-4 level. Mild right-sided neural foraminal narrowing at the T9-10 level. Mild bilateral neural from narrowing at the T10-11 and T11-12 levels. Minimal canal stenosis at the T11-12 level due to posterior aspect formation and facet arthropathy. No evidence of compression fracture or subluxation. Paraspinal and other soft tissues: Paravertebral soft tissues are within normal. Calcified plaque over the thoracic aorta. Prosthetic aortic valve is present. Cardiomegaly. Mild bibasilar atelectasis and tiny amount right pleural fluid. Small hiatal hernia. Calcified plaque over the abdominal aorta. Small cyst over the upper pole right kidney. Disc levels: Moderate disc space narrowing at the T3-4 and T5-6 levels with mild disc space narrowing throughout the remainder of the thoracic spine. CT LUMBAR SPINE FINDINGS Segmentation: 5 lumbar type vertebrae. Alignment: Slight curvature convex right. Vertebrae: Vertebral body heights are normal. There is moderate spondylosis throughout the lumbar spine. There is no compression fracture or spondylolisthesis. Prominent left paravertebral osteophyte at the L2-3 level. L1-2 level demonstrates mild broad-based disc bulge. Moderate facet arthropathy throughout the lumbar spine. L2-3 level demonstrates mild canal stenosis due to broad-based disc bulge, facet arthropathy and minimal ligamentum flavum hypertrophy. L3-4 level demonstrates mild broad-based disc bulge.  There is mild narrowing of the canal in the transverse dimension. Mild bilateral neural foraminal narrowing. L4-5 level demonstrates mild broad-based disc bulge. Borderline canal stenosis. Severe bilateral neural foraminal narrowing right worse than left. L5-S1 level demonstrates broad-based disc bulge  with minimal canal stenosis. Moderate to severe bilateral neural foraminal narrowing. Paraspinal and other soft tissues: Moderate calcified plaque over the abdominal aorta. Small cyst over the upper pole and midpole right kidney. IVC filter is present. Diverticulosis of the colon. Disc levels: Moderate disc space narrowing at all levels of the lumbar spine with vacuum disc phenomenon. IMPRESSION: CT THORACIC SPINE IMPRESSION No acute findings. Mild spondylosis of the thoracic spine. Multilevel disc disease and multilevel neural foraminal narrowing as described. Mild biphasic curvature. Minimal canal stenosis at the T11-12 level. CT LUMBAR SPINE IMPRESSION No acute findings. Moderate spondylosis throughout the lumbar spine with moderate multilevel degenerative disc disease. Varying degrees of canal stenosis at several levels as described. Varying degrees of neural foraminal narrowing at multiple levels worse at the L4-5 and L5-S1 levels. Minimal curvature convex right. Other ancillary findings over the thorax and abdomen/pelvis as described including aortic atherosclerosis, right renal cysts and bibasilar atelectasis as well as small amount right pleural fluid. Electronically Signed   By: Elberta Fortis M.D.   On: 11/08/2017 11:47   Ct Lumbar Spine Wo Contrast  Result Date: 11/08/2017 CLINICAL DATA:  Chronic diffuse thoracic and lumbar pain. No injury. EXAM: CT THORACIC AND LUMBAR SPINE WITHOUT CONTRAST TECHNIQUE: Multidetector CT imaging of the thoracic and lumbar spine was performed without contrast. Multiplanar CT image reconstructions were also generated. COMPARISON:  Chest CT 11/03/2017 and 12/14/2014 as well  as abdominopelvic CT 11/10/2012. FINDINGS: CT THORACIC SPINE FINDINGS Alignment: Subtle biphasic curvature with primary curvature convex right and secondary curvature convex left. No subluxation. Vertebrae: Vertebral body heights are maintained. There is mild spondylosis throughout the thoracic spine. Minimal right-sided neural foraminal narrowing at the T3-4 level. Mild right-sided neural foraminal narrowing at the T9-10 level. Mild bilateral neural from narrowing at the T10-11 and T11-12 levels. Minimal canal stenosis at the T11-12 level due to posterior aspect formation and facet arthropathy. No evidence of compression fracture or subluxation. Paraspinal and other soft tissues: Paravertebral soft tissues are within normal. Calcified plaque over the thoracic aorta. Prosthetic aortic valve is present. Cardiomegaly. Mild bibasilar atelectasis and tiny amount right pleural fluid. Small hiatal hernia. Calcified plaque over the abdominal aorta. Small cyst over the upper pole right kidney. Disc levels: Moderate disc space narrowing at the T3-4 and T5-6 levels with mild disc space narrowing throughout the remainder of the thoracic spine. CT LUMBAR SPINE FINDINGS Segmentation: 5 lumbar type vertebrae. Alignment: Slight curvature convex right. Vertebrae: Vertebral body heights are normal. There is moderate spondylosis throughout the lumbar spine. There is no compression fracture or spondylolisthesis. Prominent left paravertebral osteophyte at the L2-3 level. L1-2 level demonstrates mild broad-based disc bulge. Moderate facet arthropathy throughout the lumbar spine. L2-3 level demonstrates mild canal stenosis due to broad-based disc bulge, facet arthropathy and minimal ligamentum flavum hypertrophy. L3-4 level demonstrates mild broad-based disc bulge. There is mild narrowing of the canal in the transverse dimension. Mild bilateral neural foraminal narrowing. L4-5 level demonstrates mild broad-based disc bulge. Borderline  canal stenosis. Severe bilateral neural foraminal narrowing right worse than left. L5-S1 level demonstrates broad-based disc bulge with minimal canal stenosis. Moderate to severe bilateral neural foraminal narrowing. Paraspinal and other soft tissues: Moderate calcified plaque over the abdominal aorta. Small cyst over the upper pole and midpole right kidney. IVC filter is present. Diverticulosis of the colon. Disc levels: Moderate disc space narrowing at all levels of the lumbar spine with vacuum disc phenomenon. IMPRESSION: CT THORACIC SPINE IMPRESSION No acute findings. Mild spondylosis of the  thoracic spine. Multilevel disc disease and multilevel neural foraminal narrowing as described. Mild biphasic curvature. Minimal canal stenosis at the T11-12 level. CT LUMBAR SPINE IMPRESSION No acute findings. Moderate spondylosis throughout the lumbar spine with moderate multilevel degenerative disc disease. Varying degrees of canal stenosis at several levels as described. Varying degrees of neural foraminal narrowing at multiple levels worse at the L4-5 and L5-S1 levels. Minimal curvature convex right. Other ancillary findings over the thorax and abdomen/pelvis as described including aortic atherosclerosis, right renal cysts and bibasilar atelectasis as well as small amount right pleural fluid. Electronically Signed   By: Elberta Fortis M.D.   On: 11/08/2017 11:47    Cardiac Studies   - Left ventricle: The cavity size was moderately dilated. Wall   thickness was increased in a pattern of mild LVH. Systolic   function was severely reduced. The estimated ejection fraction   was in the range of 25% to 30%. Diffuse hypokinesis, with   akinesis of specific walls below. Features are consistent with a   pseudonormal left ventricular filling pattern, with concomitant   abnormal relaxation and increased filling pressure (grade 2   diastolic dysfunction). - Regional wall motion abnormality: Akinesis of the mid    anteroseptal, basal-mid inferoseptal, and mid inferior   myocardium; hypokinesis of the mid anterior, basal anteroseptal,   apical septal, apical lateral, and apical myocardium. - Aortic valve: A prosthesis was present and functioning normally.   The prosthesis had a normal range of motion. The sewing ring   appeared normal, had no rocking motion, and showed no evidence of   dehiscence. There was mild regurgitation. Mean gradient (S): 11   mm Hg. Peak gradient (S): 18 mm Hg. Valve area (VTI): 1.91 cm^2.   Valve area (Vmax): 1.73 cm^2. Valve area (Vmean): 1.71 cm^2. - Mitral valve: Mobility was restricted. There was mild   regurgitation directed eccentrically. - Left atrium: The atrium was moderately dilated. - Atrial septum: No defect or patent foramen ovale was identified. - Tricuspid valve: There was mild regurgitation. - Pulmonic valve: There was mild regurgitation. - Pulmonary arteries: PA peak pressure: 32 mm Hg (S).  Impressions:  - LV function appears worse from prior. TAVR in place, with   gradients slightly elevated from prior (peak 18, mean 11, AVA   1.91). Valvular AI also worse compared to prior.  Patient Profile     84 y.o. female with systolic heart failure acute on chronic, post TAVR.  Assessment & Plan    Acute on chronic systolic heart failure - Reasonable diuresis yesterday.  Weight is down from 170 pounds on admission to 162 pounds. -Originally oxygen saturations were in the 80s. -Continuing with IV lasix for one more day since BUN and creat increased.  carvedilol low-dose, isosorbide, not on ACE inhibitor because of creatinine.  Not on spironolactone because of creatinine.  Risk of hyperkalemia.  TAVR -Mild aortic regurgitation.  Slightly worse hemodynamic gradients.  No changes need to be made.  Paroxysmal atrial fibrillation - Carvedilol amiodarone Coumadin.  No changes.      For questions or updates, please contact [facility] Please consult  [email or web address] for contact info under Cardiology/STEMI.      Signed, Donato Schultz, MD  11/09/2017, 10:02 AM

## 2017-11-09 NOTE — Clinical Social Work Note (Signed)
Clinical Social Work Assessment  Patient Details  Name: [name] MRN: [id number] Date of Birth: Sep 01, 1932  Date of referral:  11/09/17               Reason for consult:  Discharge Planning                Permission sought to share information with:  Chartered certified accountant granted to share information::  Yes, Verbal Permission Granted  Name::        Agency::  [facility]  Relationship::     Contact Information:     Housing/Transportation Living arrangements for the past 2 months:  Onset of Information:  Patient, Medical Team, Facility Patient Interpreter Needed:  None Criminal Activity/Legal Involvement Pertinent to Current Situation/Hospitalization:  No - Comment as needed Significant Relationships:  Adult Children Lives with:  Facility Resident Do you feel safe going back to the place where you live?  Yes Need for family participation in patient care:  Yes (Comment)  Care giving concerns:  Patient is a long-term resident at [facility].   Social Worker assessment / plan:  CSW met with patient. No supports at bedside. CSW introduced role and explained that discharge planning would be discussed. Patient confirmed she is a resident at [facility] and plans to return at discharge. Patient voiced complaints about food from hospital and SNF. No further concerns. CSW encouraged patient to contact CSW as needed. CSW will continue to follow patient for support and facilitate discharge back to SNF once medically stable.  Employment status:  Retired Nurse, adult PT Recommendations:  [place] / Referral to community resources:  [place]  Patient/Family's Response to care:  Patient agreeable to return to SNF. Patient's children supportive and involved in patient's care. Patient appreciated social work intervention.  Patient/Family's Understanding of  and Emotional Response to Diagnosis, Current Treatment, and Prognosis:  Patient has a good understanding of the reason for admission and plan to return to SNF once medically stable. Patient appears happy with hospital care but is not happy about the food.  Emotional Assessment Appearance:  Appears stated age Attitude/Demeanor/Rapport:  Engaged, Gracious Affect (typically observed):  Accepting, Appropriate, Calm, Pleasant Orientation:  Oriented to Self, Oriented to Place, Oriented to  Time, Oriented to Situation Alcohol / Substance use:  Never Used Psych involvement (Current and /or in the community):  No (Comment)  Discharge Needs  Concerns to be addressed:  Care Coordination Readmission within the last 30 days:  No Current discharge risk:  None Barriers to Discharge:  Continued Medical Work up   Candie Chroman, LCSW 11/09/2017, 4:06 PM

## 2017-11-10 LAB — BASIC METABOLIC PANEL
Anion gap: 14 (ref 5–15)
BUN: 58 mg/dL — AB (ref 8–23)
CO2: 29 mmol/L (ref 22–32)
CREATININE: 2.38 mg/dL — AB (ref 0.44–1.00)
Calcium: 8.9 mg/dL (ref 8.9–10.3)
Chloride: 91 mmol/L — ABNORMAL LOW (ref 98–111)
GFR calc Af Amer: 21 mL/min — ABNORMAL LOW (ref >60)
GFR, EST NON AFRICAN AMERICAN: 18 mL/min — AB (ref >60)
Glucose, Bld: 119 mg/dL — ABNORMAL HIGH (ref 70–99)
POTASSIUM: 3.7 mmol/L (ref 3.5–5.1)
SODIUM: 134 mmol/L — AB (ref 135–145)

## 2017-11-10 LAB — CBC
HCT: 29.6 % — ABNORMAL LOW (ref 36.0–46.0)
Hemoglobin: 10.1 g/dL — ABNORMAL LOW (ref 12.0–15.0)
MCH: 32.8 pg (ref 26.0–34.0)
MCHC: 34.1 g/dL (ref 30.0–36.0)
MCV: 96.1 fL (ref 78.0–100.0)
PLATELETS: 241 10*3/uL (ref 150–400)
RBC: 3.08 MIL/uL — ABNORMAL LOW (ref 3.87–5.11)
RDW: 13.2 % (ref 11.5–15.5)
WBC: 10.4 10*3/uL (ref 4.0–10.5)

## 2017-11-10 LAB — PROTIME-INR
INR: 2.2
PROTHROMBIN TIME: 24.2 s — AB (ref 11.4–15.2)

## 2017-11-10 MED ORDER — LINACLOTIDE 145 MCG PO CAPS
290.0000 ug | ORAL_CAPSULE | Freq: Every day | ORAL | Status: DC
  Administered 2017-11-11 – 2017-11-12 (×2): 290 ug via ORAL
  Filled 2017-11-10 (×2): qty 2

## 2017-11-10 NOTE — Progress Notes (Signed)
Physical Therapy Treatment Patient Details Name: [name] MRN: [id number] DOB: 12-03-1932 Today's Date: 11/10/2017    History of Present Illness Pt is a 84 y.o. F with significant PMH of HTN, HLD, oxygen dependent chronic respiratory failure, CKD, a.fib, chronic combined systolic and diastolic CHF last EF 35-40% with grade 2 DD., fibryomyalgia, dementia, nonischemic cardiomyopathy, DVT/PE, symptomatic bradycardia s/p PM placement, CVA, OSA, hypothyroidism who presented from nursing facility with acute respiratory distress.     PT Comments    Pt admitted with above diagnosis. Pt currently with functional limitations due to the deficits listed below (see PT Problem List). Pt was able to ambulate in room a short distance with use of RW and min guard assist and min cues.  Will continue tto progress as pt is able.  Did need 3LO2 at all times to maintain sats >90% as she dropped to 79% when O2 removed by pt.  Pt will benefit from skilled PT to increase their independence and safety with mobility to allow discharge to the venue listed below.     Follow Up Recommendations  SNF     Equipment Recommendations  None recommended by PT    Recommendations for Other Services       Precautions / Restrictions Precautions Precautions: Fall Restrictions Weight Bearing Restrictions: No    Mobility  Bed Mobility Overal bed mobility: Needs Assistance Bed Mobility: Supine to Sit     Supine to sit: Supervision     General bed mobility comments: Increased time and effort  Transfers Overall transfer level: Needs assistance Equipment used: Rolling walker (2 wheeled) Transfers: Sit to/from Stand Sit to Stand: Min guard         General transfer comment: Needed incr time to come to standing and get her balance.  Ambulation/Gait Ambulation/Gait assistance: Min guard;Min assist Gait Distance (Feet): 18 Feet Assistive device: Rolling walker (2 wheeled) Gait Pattern/deviations: Step-to  pattern;Trunk flexed;Decreased stride length   Gait velocity interpretation: <1.31 ft/sec, indicative of household ambulator General Gait Details: Patient with trunk flexed and poor proximity to walker with ambulation.  Pt with worsened posture as she fatigued.  Needed cues and assist to steer RW.   Stairs             Wheelchair Mobility    Modified Rankin (Stroke Patients Only)       Balance Overall balance assessment: Needs assistance Sitting-balance support: No upper extremity supported;Feet supported Sitting balance-Leahy Scale: Good     Standing balance support: Bilateral upper extremity supported Standing balance-Leahy Scale: Poor Standing balance comment: reliant on RW and external support                            Cognition Arousal/Alertness: Awake/alert Behavior During Therapy: WFL for tasks assessed/performed Overall Cognitive Status: History of cognitive impairments - at baseline                                 General Comments: History of dementia. Patient very pleasant and cooperative and follows simple commands consistently.       Exercises      General Comments        Pertinent Vitals/Pain Pain Assessment: No/denies pain    Home Living                      Prior Function  PT Goals (current goals can now be found in the care plan section) Progress towards PT goals: Progressing toward goals    Frequency    Min 2X/week      PT Plan Current plan remains appropriate    Co-evaluation              AM-PAC PT "6 Clicks" Daily Activity  Outcome Measure  Difficulty turning over in bed (including adjusting bedclothes, sheets and blankets)?: None Difficulty moving from lying on back to sitting on the side of the bed? : None Difficulty sitting down on and standing up from a chair with arms (e.g., wheelchair, bedside commode, etc,.)?: A Little Help needed moving to and from a bed to chair  (including a wheelchair)?: A Little Help needed walking in hospital room?: A Little Help needed climbing 3-5 steps with a railing? : A Lot 6 Click Score: 19    End of Session Equipment Utilized During Treatment: Gait belt;Oxygen Activity Tolerance: Patient limited by fatigue Patient left: in chair;with call bell/phone within reach Nurse Communication: Mobility status PT Visit Diagnosis: Muscle weakness (generalized) (M62.81);Difficulty in walking, not elsewhere classified (R26.2);Pain Pain - part of body: (back)     Time: 0981-1914 PT Time Calculation (min) (ACUTE ONLY): 17 min  Charges:  $Gait Training: 8-22 mins                    G Codes:       Zemirah Krasinski,PT Acute Rehabilitation [phone]   Berline Lopes 11/10/2017, 11:24 AM

## 2017-11-10 NOTE — Progress Notes (Signed)
@IPLOG @        PROGRESS NOTE                                                                                                                                                                                                             Patient Demographics:    [name], is a 84 y.o. female, DOB - 08-01-1932, KOE:[id number]  Admit date - 11/07/2017   Admitting Physician Jodi Braun, MD  Outpatient Primary MD for the patient is Jodi Grippe, MD  LOS - 3  Chief Complaint  Patient presents with  . Respiratory Distress       Brief Narrative  [name] is a 84 y.o. female with medical history significant of HTN, HLD, oxygen dependent chronic respiratory failure, CKD 4, paroxysmal A. fib, chronic combined systolic and diastolic CHF last EF 35-40% with grade 2 DD., fibromyalgia, dementia, nonischemic cardiomyopathy, DVT/PE, symptomatic bradycardia s/p PM placement, CVA, OSA, hypothyroidism; who presented from her nursing facility with acute respiratory distress.  History is limited due to patient condition. En route with EMS patient was noted to have rales in all lung fields and placed on CPAP with O2 saturations noted to be in the 80s.  She was admitted for acute on chronic hypoxic respiratory failure due to acute on chronic CHF.   Diuresis she is much improved, now she is complaining of acute on chronic low back pain which she says has gotten worse over the last week, since she has a pacemaker CT scan T and L-spine stat has been ordered.  Kindly monitor.  She has chronic light lower extremity weakness which is unchanged, she does not have any cauda equina-like symptoms.    Subjective:   Patient in bed, appears comfortable, denies any headache, no fever, no chest pain or pressure, no shortness of breath , no abdominal pain. No focal weakness.  Continues to have acute on chronic low back pain mostly on the right side.   Assessment  & Plan :     1.  Acute on chronic hypoxic  respiratory failure due to acute on chronic combined systolic and diastolic CHF EF 50%.  Patient chronically on home oxygen, has been diuresed by cardiology, now creatinine has bumped on 11/10/2017, diuretics held, continue Coreg and Imdur.  No ACE/ARB due to renal insufficiency.  Much improved from CHF standpoint.  2.  ARF on CKD 4.  Baseline creatinine around 1.6.  Hold further diuretics and monitor.  3.  Paroxysmal atrial fibrillation.  [place] vas 2 score of  at least 4.  Continue amiodarone, resume Coreg from tomorrow, pharmacy monitoring Coumadin dose.  4.  Dyslipidemia.  On statin continue.  5.  Hypothyroidism.  On Synthroid.  TSH stable.  6.  Sinus syndrome.  Status post pacemaker placement.  7.  Chronic constipation.  Last BM 5 days prior to the day of admission.  Continue home medication Linzess, added MiraLAX and Dulcolax suppository.  8.  Gout.  Stable continue home dose allopurinol.  9.  Acute on Chronic back pain and peripheral neuropathy.  Continue low-dose home narcotic regimen along with Neurontin.  She claims that her back pain got acutely worse 3 to 4 days prior to the hospitalization, no cauda equina symptoms, no recent injuries, she claims she has had 3-4 back surgeries in the remote past, CT scan of T and L-spine was obtained no MRI as she has a pacemaker, CT scan reveals multilevel DJD and spinal stenosis, neurosurgery consulted who have recommended conservative management, placed on prednisone and PT, continue supportive care, her pain genuinely appears to be chronic and not acute and she does exhibit some narcotic seeking behavior, continue supportive care with PT.  10.  Single mildly elevated troponin.  Likely due to #1 above, will trend.  Check echocardiogram to evaluate wall motion and EF, continue combination of Coumadin, beta-blocker and statin for secondary prevention.  No chest pain.  Cardiology on board.    Diet :  Diet Order           Diet Heart Room service  appropriate? Yes; Fluid consistency: Thin; Fluid restriction: 1500 mL Fluid  Diet effective now            Family Communication  : None present  Code Status : Full  Disposition Plan  : SNF once renal function has improved  Consults  : Cardiology, neurosurgery  Procedures  :     CT T and L-spine.  Showing multilevel DJD and spinal stenosis.    Echocardiogram - LV function appears worse from prior now 25 to 30% down from 35 to 40%. TAVR in place, with gradients slightly elevated from prior (peak 18, mean 11, AVA 1.91). Valvular AI also worse compared to prior.   DVT Prophylaxis  : Coumadin  Lab Results  Component Value Date   INR 2.20 11/10/2017   INR 2.57 11/09/2017   INR 2.48 11/08/2017     Lab Results  Component Value Date   PLT 241 11/10/2017    Inpatient Medications  Scheduled Meds: . allopurinol  100 mg Oral Daily  . amiodarone  100 mg Oral Daily  . atorvastatin  10 mg Oral q1800  . calcitRIOL  0.25 mcg Oral Daily  . carvedilol  3.125 mg Oral BID WC  . cycloSPORINE  1 drop Both Eyes BID  . donepezil  10 mg Oral QHS  . DULoxetine  60 mg Oral q1800  . ferrous sulfate  325 mg Oral Q breakfast  . gabapentin  200 mg Oral BID  . ipratropium-albuterol  3 mL Nebulization TID  . isosorbide mononitrate  30 mg Oral Daily  . levothyroxine  100 mcg Oral QAC breakfast  . [START ON 11/11/2017] linaclotide  290 mcg Oral QAC breakfast  . mouth rinse  15 mL Mouth Rinse BID  . Melatonin  3 mg Oral QHS  . MUSCLE RUB  1 application Topical BID  . pantoprazole  40 mg Oral Daily  . polyethylene glycol  17 g Oral BID  . predniSONE  40 mg Oral Q  breakfast  . senna  1 tablet Oral BID  . sodium chloride  1 spray Each Nare TID  . traZODone  50 mg Oral QHS  . warfarin  2 mg Oral q1800  . Warfarin - Pharmacist Dosing Inpatient   Does not apply q1800   Continuous Infusions:  PRN Meds:.acetaminophen, HYDROcodone-acetaminophen, ondansetron (ZOFRAN) IV  Antibiotics  :     Anti-infectives (From admission, onward)   None         Objective:   Vitals:   11/10/17 0850 11/10/17 1204 11/10/17 1410 11/10/17 1416  BP:  105/78 134/69   Pulse: 82 78 78   Resp: (!) 22 19 (!) 21   Temp:  98.5 F (36.9 C) 97.6 F (36.4 C)   TempSrc:  Oral Oral   SpO2: 95% 95% 98% 100%  Weight:      Height:        Wt Readings from Last 3 Encounters:  11/10/17 73.6 kg (162 lb 4.1 oz)  10/26/17 72.1 kg (159 lb)  01/21/17 74.8 kg (165 lb)     Intake/Output Summary (Last 24 hours) at 11/10/2017 1428 Last data filed at 11/10/2017 1233 Gross per 24 hour  Intake 480 ml  Output 2200 ml  Net -1720 ml     Physical Exam  Awake Alert, Oriented X 3, No new F.N deficits, Normal affect, she appears to be in no distress .AT,PERRAL Supple Neck,No JVD, No cervical lymphadenopathy appriciated.  Symmetrical Chest wall movement, Good air movement bilaterally, few rales RRR,No Gallops, Rubs or new Murmurs, No Parasternal Heave +ve B.Sounds, Abd Soft, No tenderness, No organomegaly appriciated, No rebound - guarding or rigidity. No Cyanosis, Clubbing or edema, No new Rash or bruise, R leg comparatively weaker than the left but this is chronic according to the patient   Data Review:    CBC Recent Labs  Lab 11/07/17 0217 11/07/17 0232 11/07/17 0409 11/08/17 0251 11/10/17 0621  WBC 14.6*  --   --  12.2* 10.4  HGB 13.4 14.3 12.9  12.9 10.8* 10.1*  HCT 41.3 42.0 38.0  38.0 31.8* 29.6*  PLT 262  --   --  226 241  MCV 101.2*  --   --  96.7 96.1  MCH 32.8  --   --  32.8 32.8  MCHC 32.4  --   --  34.0 34.1  RDW 14.1  --   --  13.7 13.2  LYMPHSABS 2.8  --   --   --   --   MONOABS 0.5  --   --   --   --   EOSABS 0.2  --   --   --   --   BASOSABS 0.1  --   --   --   --     Chemistries  Recent Labs  Lab 11/07/17 0217 11/07/17 0232 11/07/17 0409 11/07/17 0418 11/08/17 0251 11/09/17 0417 11/10/17 0621  NA 135 132* 131*  131*  --  138 135 134*  K 5.1 5.0 7.9*   7.9* 3.8 3.2* 4.7 3.7  CL 97* 99 98  98  --  95* 94* 91*  CO2 25  --   --   --  30 30 29   GLUCOSE 258* 254* 129*  129*  --  100* 94 119*  BUN 41* 64* 71*  71*  --  38* 49* 58*  CREATININE 2.15* 2.10* 2.10*  2.10*  --  1.99* 2.19* 2.38*  CALCIUM 9.2  --   --   --  9.2 8.8* 8.9  MG  --   --   --  1.8 1.7 1.8  --    ------------------------------------------------------------------------------------------------------------------ No results for input(s): CHOL, HDL, LDLCALC, TRIG, CHOLHDL, LDLDIRECT in the last 72 hours.  Lab Results  Component Value Date   HGBA1C 5.0 06/14/2016   ------------------------------------------------------------------------------------------------------------------ No results for input(s): TSH, T4TOTAL, T3FREE, THYROIDAB in the last 72 hours.  Invalid input(s): FREET3 ------------------------------------------------------------------------------------------------------------------ No results for input(s): VITAMINB12, FOLATE, FERRITIN, TIBC, IRON, RETICCTPCT in the last 72 hours.  Coagulation profile Recent Labs  Lab 11/07/17 0635 11/08/17 0834 11/09/17 0417 11/10/17 0621  INR 2.12 2.48 2.57 2.20    No results for input(s): DDIMER in the last 72 hours.  Cardiac Enzymes Recent Labs  Lab 11/07/17 1225 11/07/17 1928  TROPONINI 0.32* 0.33*   ------------------------------------------------------------------------------------------------------------------    Component Value Date/Time   BNP 491.3 (H) 11/07/2017 0219    Micro Results Recent Results (from the past 240 hour(s))  MRSA PCR Screening     Status: Abnormal   Collection Time: 11/07/17  6:39 AM  Result Value Ref Range Status   MRSA by PCR POSITIVE (A) NEGATIVE Final    Comment: RESULT CALLED TO, READ BACK BY AND VERIFIED WITH: RN Shela Commons [facility] [id number]        The GeneXpert MRSA Assay (FDA approved for NASAL specimens only), is one component of a comprehensive MRSA  colonization surveillance program. It is not intended to diagnose MRSA infection nor to guide or monitor treatment for MRSA infections.     Radiology Reports Ct Thoracic Spine Wo Contrast  Result Date: 11/08/2017 CLINICAL DATA:  Chronic diffuse thoracic and lumbar pain. No injury. EXAM: CT THORACIC AND LUMBAR SPINE WITHOUT CONTRAST TECHNIQUE: Multidetector CT imaging of the thoracic and lumbar spine was performed without contrast. Multiplanar CT image reconstructions were also generated. COMPARISON:  Chest CT 11/03/2017 and 12/14/2014 as well as abdominopelvic CT 11/10/2012. FINDINGS: CT THORACIC SPINE FINDINGS Alignment: Subtle biphasic curvature with primary curvature convex right and secondary curvature convex left. No subluxation. Vertebrae: Vertebral body heights are maintained. There is mild spondylosis throughout the thoracic spine. Minimal right-sided neural foraminal narrowing at the T3-4 level. Mild right-sided neural foraminal narrowing at the T9-10 level. Mild bilateral neural from narrowing at the T10-11 and T11-12 levels. Minimal canal stenosis at the T11-12 level due to posterior aspect formation and facet arthropathy. No evidence of compression fracture or subluxation. Paraspinal and other soft tissues: Paravertebral soft tissues are within normal. Calcified plaque over the thoracic aorta. Prosthetic aortic valve is present. Cardiomegaly. Mild bibasilar atelectasis and tiny amount right pleural fluid. Small hiatal hernia. Calcified plaque over the abdominal aorta. Small cyst over the upper pole right kidney. Disc levels: Moderate disc space narrowing at the T3-4 and T5-6 levels with mild disc space narrowing throughout the remainder of the thoracic spine. CT LUMBAR SPINE FINDINGS Segmentation: 5 lumbar type vertebrae. Alignment: Slight curvature convex right. Vertebrae: Vertebral body heights are normal. There is moderate spondylosis throughout the lumbar spine. There is no compression  fracture or spondylolisthesis. Prominent left paravertebral osteophyte at the L2-3 level. L1-2 level demonstrates mild broad-based disc bulge. Moderate facet arthropathy throughout the lumbar spine. L2-3 level demonstrates mild canal stenosis due to broad-based disc bulge, facet arthropathy and minimal ligamentum flavum hypertrophy. L3-4 level demonstrates mild broad-based disc bulge. There is mild narrowing of the canal in the transverse dimension. Mild bilateral neural foraminal narrowing. L4-5 level demonstrates mild broad-based disc bulge. Borderline canal stenosis. Severe bilateral neural foraminal narrowing  right worse than left. L5-S1 level demonstrates broad-based disc bulge with minimal canal stenosis. Moderate to severe bilateral neural foraminal narrowing. Paraspinal and other soft tissues: Moderate calcified plaque over the abdominal aorta. Small cyst over the upper pole and midpole right kidney. IVC filter is present. Diverticulosis of the colon. Disc levels: Moderate disc space narrowing at all levels of the lumbar spine with vacuum disc phenomenon. IMPRESSION: CT THORACIC SPINE IMPRESSION No acute findings. Mild spondylosis of the thoracic spine. Multilevel disc disease and multilevel neural foraminal narrowing as described. Mild biphasic curvature. Minimal canal stenosis at the T11-12 level. CT LUMBAR SPINE IMPRESSION No acute findings. Moderate spondylosis throughout the lumbar spine with moderate multilevel degenerative disc disease. Varying degrees of canal stenosis at several levels as described. Varying degrees of neural foraminal narrowing at multiple levels worse at the L4-5 and L5-S1 levels. Minimal curvature convex right. Other ancillary findings over the thorax and abdomen/pelvis as described including aortic atherosclerosis, right renal cysts and bibasilar atelectasis as well as small amount right pleural fluid. Electronically Signed   By: Elberta Fortis M.D.   On: 11/08/2017 11:47   Ct  Lumbar Spine Wo Contrast  Result Date: 11/08/2017 CLINICAL DATA:  Chronic diffuse thoracic and lumbar pain. No injury. EXAM: CT THORACIC AND LUMBAR SPINE WITHOUT CONTRAST TECHNIQUE: Multidetector CT imaging of the thoracic and lumbar spine was performed without contrast. Multiplanar CT image reconstructions were also generated. COMPARISON:  Chest CT 11/03/2017 and 12/14/2014 as well as abdominopelvic CT 11/10/2012. FINDINGS: CT THORACIC SPINE FINDINGS Alignment: Subtle biphasic curvature with primary curvature convex right and secondary curvature convex left. No subluxation. Vertebrae: Vertebral body heights are maintained. There is mild spondylosis throughout the thoracic spine. Minimal right-sided neural foraminal narrowing at the T3-4 level. Mild right-sided neural foraminal narrowing at the T9-10 level. Mild bilateral neural from narrowing at the T10-11 and T11-12 levels. Minimal canal stenosis at the T11-12 level due to posterior aspect formation and facet arthropathy. No evidence of compression fracture or subluxation. Paraspinal and other soft tissues: Paravertebral soft tissues are within normal. Calcified plaque over the thoracic aorta. Prosthetic aortic valve is present. Cardiomegaly. Mild bibasilar atelectasis and tiny amount right pleural fluid. Small hiatal hernia. Calcified plaque over the abdominal aorta. Small cyst over the upper pole right kidney. Disc levels: Moderate disc space narrowing at the T3-4 and T5-6 levels with mild disc space narrowing throughout the remainder of the thoracic spine. CT LUMBAR SPINE FINDINGS Segmentation: 5 lumbar type vertebrae. Alignment: Slight curvature convex right. Vertebrae: Vertebral body heights are normal. There is moderate spondylosis throughout the lumbar spine. There is no compression fracture or spondylolisthesis. Prominent left paravertebral osteophyte at the L2-3 level. L1-2 level demonstrates mild broad-based disc bulge. Moderate facet arthropathy  throughout the lumbar spine. L2-3 level demonstrates mild canal stenosis due to broad-based disc bulge, facet arthropathy and minimal ligamentum flavum hypertrophy. L3-4 level demonstrates mild broad-based disc bulge. There is mild narrowing of the canal in the transverse dimension. Mild bilateral neural foraminal narrowing. L4-5 level demonstrates mild broad-based disc bulge. Borderline canal stenosis. Severe bilateral neural foraminal narrowing right worse than left. L5-S1 level demonstrates broad-based disc bulge with minimal canal stenosis. Moderate to severe bilateral neural foraminal narrowing. Paraspinal and other soft tissues: Moderate calcified plaque over the abdominal aorta. Small cyst over the upper pole and midpole right kidney. IVC filter is present. Diverticulosis of the colon. Disc levels: Moderate disc space narrowing at all levels of the lumbar spine with vacuum disc phenomenon. IMPRESSION: CT  THORACIC SPINE IMPRESSION No acute findings. Mild spondylosis of the thoracic spine. Multilevel disc disease and multilevel neural foraminal narrowing as described. Mild biphasic curvature. Minimal canal stenosis at the T11-12 level. CT LUMBAR SPINE IMPRESSION No acute findings. Moderate spondylosis throughout the lumbar spine with moderate multilevel degenerative disc disease. Varying degrees of canal stenosis at several levels as described. Varying degrees of neural foraminal narrowing at multiple levels worse at the L4-5 and L5-S1 levels. Minimal curvature convex right. Other ancillary findings over the thorax and abdomen/pelvis as described including aortic atherosclerosis, right renal cysts and bibasilar atelectasis as well as small amount right pleural fluid. Electronically Signed   By: Elberta Fortis M.D.   On: 11/08/2017 11:47   Ct Chest High Resolution  Result Date: 11/04/2017 CLINICAL DATA:  3+ months of wheezing, coughing and hemoptysis. EXAM: CT CHEST WITHOUT CONTRAST TECHNIQUE: Multidetector  CT imaging of the chest was performed following the standard protocol without intravenous contrast. High resolution imaging of the lungs, as well as inspiratory and expiratory imaging, was performed. COMPARISON:  12/14/2014. FINDINGS: Cardiovascular: Atherosclerotic calcification of the arterial vasculature, including extensive three-vessel involvement of the coronary arteries. Aortic valve replacement. Heart is enlarged. No pericardial effusion. Mediastinum/Nodes: Mediastinal lymph nodes measure up to 9 mm in the low right paratracheal station, as before. Hilar regions are difficult to evaluate without IV contrast. No axillary adenopathy. Esophagus is grossly unremarkable. Moderate hiatal hernia. Lungs/Pleura: Image quality is degraded by respiratory motion and expiratory phase imaging. Scarring in the right middle lobe, lingula and both lower lobes. No definitive subpleural reticulation, traction bronchiectasis/bronchiolectasis, ground-glass, architectural distortion or honeycombing. No pleural fluid. Airway is unremarkable. No air trapping. Upper Abdomen: Visualized portion of the liver is unremarkable. A stone is seen in the gallbladder. Visualized portions of the adrenal glands are unremarkable. Partially imaged low-attenuation lesions in the kidneys bilaterally. Visualized portions of the spleen, pancreas, stomach and bowel are grossly unremarkable with exception of a moderate hiatal hernia. Upper abdominal lymph nodes are not enlarged by CT size criteria. Musculoskeletal: Degenerative changes in the spine. No worrisome lytic or sclerotic lesions. IMPRESSION: 1. Image quality is degraded by respiratory motion and expiratory phase imaging. No definitive evidence of interstitial lung disease. 2. Aortic atherosclerosis (ICD10-170.0). Extensive three-vessel coronary artery calcification. 3. Moderate hiatal hernia. 4. Cholelithiasis. Electronically Signed   By: Leanna Battles M.D.   On: 11/04/2017 07:49   Dg  Chest Portable 1 View  Result Date: 11/07/2017 CLINICAL DATA:  84 year old female with respiratory distress. EXAM: PORTABLE CHEST 1 VIEW COMPARISON:  Chest CT dated 11/03/2017 FINDINGS: There is cardiomegaly with vascular congestion and mild edema. Small left pleural effusion and left lung base atelectasis. Pneumonia is not excluded. There is no pneumothorax. Right pectoral pacemaker device and mechanical aortic valve. No acute osseous pathology. IMPRESSION: Cardiomegaly with findings of CHF and small left pleural effusion. Pneumonia is not excluded. Clinical correlation is recommended. Electronically Signed   By: Elgie Collard M.D.   On: 11/07/2017 02:32    Time Spent in minutes  30   Susa Raring M.D on 11/10/2017 at 2:28 PM  To page go to [email or web address] - password [email or web address]

## 2017-11-10 NOTE — Progress Notes (Signed)
Progress Note  Patient Name: [name] Date of Encounter: 11/10/2017  Primary Cardiologist: Tonny Bollman, MD   Subjective   Overall no chest pain, shortness of breath stable. Mild HA. No changes. BUN and creat are elevated.   Inpatient Medications    Scheduled Meds: . allopurinol  100 mg Oral Daily  . amiodarone  100 mg Oral Daily  . atorvastatin  10 mg Oral q1800  . calcitRIOL  0.25 mcg Oral Daily  . carvedilol  3.125 mg Oral BID WC  . cycloSPORINE  1 drop Both Eyes BID  . donepezil  10 mg Oral QHS  . DULoxetine  60 mg Oral q1800  . ferrous sulfate  325 mg Oral Q breakfast  . furosemide  80 mg Intravenous BID  . gabapentin  200 mg Oral BID  . ipratropium-albuterol  3 mL Nebulization TID  . isosorbide mononitrate  30 mg Oral Daily  . levothyroxine  100 mcg Oral QAC breakfast  . linaclotide  145 mcg Oral Daily  . mouth rinse  15 mL Mouth Rinse BID  . Melatonin  3 mg Oral QHS  . MUSCLE RUB  1 application Topical BID  . pantoprazole  40 mg Oral Daily  . polyethylene glycol  17 g Oral BID  . predniSONE  40 mg Oral Q breakfast  . senna  1 tablet Oral BID  . sodium chloride  1 spray Each Nare TID  . traZODone  50 mg Oral QHS  . warfarin  2 mg Oral q1800  . Warfarin - Pharmacist Dosing Inpatient   Does not apply q1800   Continuous Infusions:  PRN Meds: acetaminophen, HYDROcodone-acetaminophen, ondansetron (ZOFRAN) IV   Vital Signs    Vitals:   11/10/17 0441 11/10/17 0759 11/10/17 0820 11/10/17 0850  BP: (!) 165/81  140/74   Pulse: 87  84 82  Resp: (!) 29  (!) 22 (!) 22  Temp: (!) 97.5 F (36.4 C)  98 F (36.7 C)   TempSrc: Oral  Oral   SpO2: 92% 100% 95% 95%  Weight: 162 lb 4.1 oz (73.6 kg)     Height:        Intake/Output Summary (Last 24 hours) at 11/10/2017 0929 Last data filed at 11/10/2017 0445 Gross per 24 hour  Intake 600 ml  Output 1500 ml  Net -900 ml   Filed Weights   11/08/17 0406 11/09/17 0304 11/10/17 0441  Weight: 169 lb 8.5 oz  (76.9 kg) 162 lb 4.1 oz (73.6 kg) 162 lb 4.1 oz (73.6 kg)    Telemetry    No adverse arrhythmias- Personally Reviewed  ECG    No new- Personally Reviewed  Physical Exam   GEN: Well nourished, well developed, in no acute distress  HEENT: normal  Neck: no JVD, carotid bruits, or masses Cardiac: RRR; no murmurs, rubs, or gallops,no edema  Respiratory:  clear to auscultation bilaterally, normal work of breathing GI: soft, nontender, nondistended, + BS MS: no deformity or atrophy  Skin: warm and dry, no rash Neuro:  Alert and Oriented x 3, Strength and sensation are intact Psych: euthymic mood, full affect   Labs    Chemistry Recent Labs  Lab 11/08/17 0251 11/09/17 0417 11/10/17 0621  NA 138 135 134*  K 3.2* 4.7 3.7  CL 95* 94* 91*  CO2 30 30 29   GLUCOSE 100* 94 119*  BUN 38* 49* 58*  CREATININE 1.99* 2.19* 2.38*  CALCIUM 9.2 8.8* 8.9  GFRNONAA 22* 20* 18*  GFRAA 26*  23* 21*  ANIONGAP 13 11 14      Hematology Recent Labs  Lab 11/07/17 0217  11/07/17 0409 11/08/17 0251 11/10/17 0621  WBC 14.6*  --   --  12.2* 10.4  RBC 4.08  --   --  3.29* 3.08*  HGB 13.4   < > 12.9  12.9 10.8* 10.1*  HCT 41.3   < > 38.0  38.0 31.8* 29.6*  MCV 101.2*  --   --  96.7 96.1  MCH 32.8  --   --  32.8 32.8  MCHC 32.4  --   --  34.0 34.1  RDW 14.1  --   --  13.7 13.2  PLT 262  --   --  226 241   < > = values in this interval not displayed.    Cardiac Enzymes Recent Labs  Lab 11/07/17 1225 11/07/17 1928  TROPONINI 0.32* 0.33*    Recent Labs  Lab 11/07/17 0230  TROPIPOC 0.16*     BNP Recent Labs  Lab 11/07/17 0219  BNP 491.3*     DDimer No results for input(s): DDIMER in the last 168 hours.   Radiology    Ct Thoracic Spine Wo Contrast  Result Date: 11/08/2017 CLINICAL DATA:  Chronic diffuse thoracic and lumbar pain. No injury. EXAM: CT THORACIC AND LUMBAR SPINE WITHOUT CONTRAST TECHNIQUE: Multidetector CT imaging of the thoracic and lumbar spine was  performed without contrast. Multiplanar CT image reconstructions were also generated. COMPARISON:  Chest CT 11/03/2017 and 12/14/2014 as well as abdominopelvic CT 11/10/2012. FINDINGS: CT THORACIC SPINE FINDINGS Alignment: Subtle biphasic curvature with primary curvature convex right and secondary curvature convex left. No subluxation. Vertebrae: Vertebral body heights are maintained. There is mild spondylosis throughout the thoracic spine. Minimal right-sided neural foraminal narrowing at the T3-4 level. Mild right-sided neural foraminal narrowing at the T9-10 level. Mild bilateral neural from narrowing at the T10-11 and T11-12 levels. Minimal canal stenosis at the T11-12 level due to posterior aspect formation and facet arthropathy. No evidence of compression fracture or subluxation. Paraspinal and other soft tissues: Paravertebral soft tissues are within normal. Calcified plaque over the thoracic aorta. Prosthetic aortic valve is present. Cardiomegaly. Mild bibasilar atelectasis and tiny amount right pleural fluid. Small hiatal hernia. Calcified plaque over the abdominal aorta. Small cyst over the upper pole right kidney. Disc levels: Moderate disc space narrowing at the T3-4 and T5-6 levels with mild disc space narrowing throughout the remainder of the thoracic spine. CT LUMBAR SPINE FINDINGS Segmentation: 5 lumbar type vertebrae. Alignment: Slight curvature convex right. Vertebrae: Vertebral body heights are normal. There is moderate spondylosis throughout the lumbar spine. There is no compression fracture or spondylolisthesis. Prominent left paravertebral osteophyte at the L2-3 level. L1-2 level demonstrates mild broad-based disc bulge. Moderate facet arthropathy throughout the lumbar spine. L2-3 level demonstrates mild canal stenosis due to broad-based disc bulge, facet arthropathy and minimal ligamentum flavum hypertrophy. L3-4 level demonstrates mild broad-based disc bulge. There is mild narrowing of the  canal in the transverse dimension. Mild bilateral neural foraminal narrowing. L4-5 level demonstrates mild broad-based disc bulge. Borderline canal stenosis. Severe bilateral neural foraminal narrowing right worse than left. L5-S1 level demonstrates broad-based disc bulge with minimal canal stenosis. Moderate to severe bilateral neural foraminal narrowing. Paraspinal and other soft tissues: Moderate calcified plaque over the abdominal aorta. Small cyst over the upper pole and midpole right kidney. IVC filter is present. Diverticulosis of the colon. Disc levels: Moderate disc space narrowing at all levels of the lumbar  spine with vacuum disc phenomenon. IMPRESSION: CT THORACIC SPINE IMPRESSION No acute findings. Mild spondylosis of the thoracic spine. Multilevel disc disease and multilevel neural foraminal narrowing as described. Mild biphasic curvature. Minimal canal stenosis at the T11-12 level. CT LUMBAR SPINE IMPRESSION No acute findings. Moderate spondylosis throughout the lumbar spine with moderate multilevel degenerative disc disease. Varying degrees of canal stenosis at several levels as described. Varying degrees of neural foraminal narrowing at multiple levels worse at the L4-5 and L5-S1 levels. Minimal curvature convex right. Other ancillary findings over the thorax and abdomen/pelvis as described including aortic atherosclerosis, right renal cysts and bibasilar atelectasis as well as small amount right pleural fluid. Electronically Signed   By: Elberta Fortis M.D.   On: 11/08/2017 11:47   Ct Lumbar Spine Wo Contrast  Result Date: 11/08/2017 CLINICAL DATA:  Chronic diffuse thoracic and lumbar pain. No injury. EXAM: CT THORACIC AND LUMBAR SPINE WITHOUT CONTRAST TECHNIQUE: Multidetector CT imaging of the thoracic and lumbar spine was performed without contrast. Multiplanar CT image reconstructions were also generated. COMPARISON:  Chest CT 11/03/2017 and 12/14/2014 as well as abdominopelvic CT 11/10/2012.  FINDINGS: CT THORACIC SPINE FINDINGS Alignment: Subtle biphasic curvature with primary curvature convex right and secondary curvature convex left. No subluxation. Vertebrae: Vertebral body heights are maintained. There is mild spondylosis throughout the thoracic spine. Minimal right-sided neural foraminal narrowing at the T3-4 level. Mild right-sided neural foraminal narrowing at the T9-10 level. Mild bilateral neural from narrowing at the T10-11 and T11-12 levels. Minimal canal stenosis at the T11-12 level due to posterior aspect formation and facet arthropathy. No evidence of compression fracture or subluxation. Paraspinal and other soft tissues: Paravertebral soft tissues are within normal. Calcified plaque over the thoracic aorta. Prosthetic aortic valve is present. Cardiomegaly. Mild bibasilar atelectasis and tiny amount right pleural fluid. Small hiatal hernia. Calcified plaque over the abdominal aorta. Small cyst over the upper pole right kidney. Disc levels: Moderate disc space narrowing at the T3-4 and T5-6 levels with mild disc space narrowing throughout the remainder of the thoracic spine. CT LUMBAR SPINE FINDINGS Segmentation: 5 lumbar type vertebrae. Alignment: Slight curvature convex right. Vertebrae: Vertebral body heights are normal. There is moderate spondylosis throughout the lumbar spine. There is no compression fracture or spondylolisthesis. Prominent left paravertebral osteophyte at the L2-3 level. L1-2 level demonstrates mild broad-based disc bulge. Moderate facet arthropathy throughout the lumbar spine. L2-3 level demonstrates mild canal stenosis due to broad-based disc bulge, facet arthropathy and minimal ligamentum flavum hypertrophy. L3-4 level demonstrates mild broad-based disc bulge. There is mild narrowing of the canal in the transverse dimension. Mild bilateral neural foraminal narrowing. L4-5 level demonstrates mild broad-based disc bulge. Borderline canal stenosis. Severe bilateral  neural foraminal narrowing right worse than left. L5-S1 level demonstrates broad-based disc bulge with minimal canal stenosis. Moderate to severe bilateral neural foraminal narrowing. Paraspinal and other soft tissues: Moderate calcified plaque over the abdominal aorta. Small cyst over the upper pole and midpole right kidney. IVC filter is present. Diverticulosis of the colon. Disc levels: Moderate disc space narrowing at all levels of the lumbar spine with vacuum disc phenomenon. IMPRESSION: CT THORACIC SPINE IMPRESSION No acute findings. Mild spondylosis of the thoracic spine. Multilevel disc disease and multilevel neural foraminal narrowing as described. Mild biphasic curvature. Minimal canal stenosis at the T11-12 level. CT LUMBAR SPINE IMPRESSION No acute findings. Moderate spondylosis throughout the lumbar spine with moderate multilevel degenerative disc disease. Varying degrees of canal stenosis at several levels as described. Varying degrees  of neural foraminal narrowing at multiple levels worse at the L4-5 and L5-S1 levels. Minimal curvature convex right. Other ancillary findings over the thorax and abdomen/pelvis as described including aortic atherosclerosis, right renal cysts and bibasilar atelectasis as well as small amount right pleural fluid. Electronically Signed   By: Elberta Fortis M.D.   On: 11/08/2017 11:47    Cardiac Studies   - Left ventricle: The cavity size was moderately dilated. Wall   thickness was increased in a pattern of mild LVH. Systolic   function was severely reduced. The estimated ejection fraction   was in the range of 25% to 30%. Diffuse hypokinesis, with   akinesis of specific walls below. Features are consistent with a   pseudonormal left ventricular filling pattern, with concomitant   abnormal relaxation and increased filling pressure (grade 2   diastolic dysfunction). - Regional wall motion abnormality: Akinesis of the mid   anteroseptal, basal-mid inferoseptal,  and mid inferior   myocardium; hypokinesis of the mid anterior, basal anteroseptal,   apical septal, apical lateral, and apical myocardium. - Aortic valve: A prosthesis was present and functioning normally.   The prosthesis had a normal range of motion. The sewing ring   appeared normal, had no rocking motion, and showed no evidence of   dehiscence. There was mild regurgitation. Mean gradient (S): 11   mm Hg. Peak gradient (S): 18 mm Hg. Valve area (VTI): 1.91 cm^2.   Valve area (Vmax): 1.73 cm^2. Valve area (Vmean): 1.71 cm^2. - Mitral valve: Mobility was restricted. There was mild   regurgitation directed eccentrically. - Left atrium: The atrium was moderately dilated. - Atrial septum: No defect or patent foramen ovale was identified. - Tricuspid valve: There was mild regurgitation. - Pulmonic valve: There was mild regurgitation. - Pulmonary arteries: PA peak pressure: 32 mm Hg (S).  Impressions:  - LV function appears worse from prior. TAVR in place, with   gradients slightly elevated from prior (peak 18, mean 11, AVA   1.91). Valvular AI also worse compared to prior.  Patient Profile     84 y.o. female with systolic heart failure acute on chronic, post TAVR.  Assessment & Plan    Acute on chronic systolic heart failure - Reasonable diuresis yesterday.  Weight is down from 170 pounds on admission to 162 pounds. -Originally oxygen saturations were in the 80s. -Stopping IV lasix since BUN and creat increased.  Carvedilol low-dose, isosorbide, not on ACE inhibitor because of creatinine.  Not on spironolactone because of creatinine.  Risk of hyperkalemia.  TAVR -Mild aortic regurgitation.  Slightly worse hemodynamic gradients.  No changes need to be made. Stable  Paroxysmal atrial fibrillation - Carvedilol amiodarone Coumadin.  No changes.  For questions or updates, please contact [facility] Please consult [email or web address] for contact info under Cardiology/STEMI.        Signed, Donato Schultz, MD  11/10/2017, 9:29 AM

## 2017-11-10 NOTE — Care Management Note (Addendum)
Case Management Note Donn Pierini RN, BSN Unit 4E-Case Manager [phone]  Patient Details  Name: [name] MRN: [id number] Date of Birth: January 17, 1933  Subjective/Objective:    Pt admitted with Acute on Chronic resp. failure (acute on chronic HF)  post TAVR              Action/Plan: PTA pt was at [facility],(chronic 02 dependent) CSW following for return to [facility] when medically stable.  Expected Discharge Date:                  Expected Discharge Plan:  Skilled Nursing Facility  In-House Referral:  Clinical Social Work  Discharge planning Services  CM Consult  Post Acute Care Choice:    Choice offered to:     DME Arranged:    DME Agency:     HH Arranged:    HH Agency:     Status of Service:  In process, will continue to follow  If discussed at Long Length of Stay Meetings, dates discussed:    Discharge Disposition: skilled facility   Additional Comments:  Darrold Span, RN 11/10/2017, 10:49 AM

## 2017-11-10 NOTE — Progress Notes (Signed)
ANTICOAGULATION CONSULT NOTE - Follow Up Consult  Pharmacy Consult for Warfarin  Indication: atrial fibrillation  Patient Measurements: Height: 4\' 10"  (147.3 cm) Weight: 162 lb 4.1 oz (73.6 kg) IBW/kg (Calculated) : 40.9 Vital Signs: Temp: 97.6 F (36.4 C) (07/23 1410) Temp Source: Oral (07/23 1410) BP: 134/69 (07/23 1410) Pulse Rate: 78 (07/23 1410)  Labs: Recent Labs    11/07/17 1928 11/08/17 0251 11/08/17 0834 11/09/17 0417 11/10/17 0621  HGB  --  10.8*  --   --  10.1*  HCT  --  31.8*  --   --  29.6*  PLT  --  226  --   --  241  LABPROT  --   --  26.6* 27.4* 24.2*  INR  --   --  2.48 2.57 2.20  CREATININE  --  1.99*  --  2.19* 2.38*  TROPONINI 0.33*  --   --   --   --     Estimated Creatinine Clearance: 15.3 mL/min (A) (by C-G formula based on SCr of 2.38 mg/dL (H)).   Medical History: Past Medical History:  Diagnosis Date  . Acute on chronic renal failure [facility])    sees Dr Allena Katz   . Anemia    Acute blood loss  . Aortic regurgitation   . Aortic stenosis 10/13/2012   Low EF, low gradient with severe aortic stenosis confirmed by dobutamine stress echocardiogram s/p TAVR 12/2012  . Asthma   . Atrial fibrillation (HCC)    tachy-brady syndrome with <1% recurrent PAF since pacemaker placement  . Cataracts, bilateral   . Chronic combined systolic and diastolic CHF (congestive heart failure) (HCC)   . Chronic lower back pain   . CKD (chronic kidney disease)   . Coronary artery disease involving native coronary artery of native heart   . Dementia    Without behavioral disturbance  . Dysrhythmia   . Fibromyalgia   . Gastroesophageal reflux disease   . H/O dizziness   . H/O urinary frequency   . H/O: stroke   . Hard of hearing   . Headache    hx of migraines   . Heart murmur   . History of blood transfusion   . History of bronchitis   . History of kidney stones   . History of urinary tract infection   . HLD (hyperlipidemia)   . HTN (hypertension)   .  Hypothyroidism   . Neuropathy   . Nonischemic cardiomyopathy (HCC)   . On home oxygen therapy    patient uses at nite- 2L- has not used in > 6 months per patient  . Osteoarthritis   . Osteoporosis   . Pneumonia    hx of x 3   . PONV (postoperative nausea and vomiting)   . Presence of permanent cardiac pacemaker   . Pulmonary embolism (HCC)    HISTORY OF, the pt. had a recurrent bilateral pulmonary emboli in 2005, on warfarin therapy and at which time she under went implantation of IVC filter  . Repeated falls   . Rupture of right patellar tendon   . Sciatica   . Shortness of breath dyspnea    with exertion   . Sleep apnea    uses oxygen at night and PRN- not used since > 6 months / DOES NOT USE  C-PAP  . Spinal stenosis   . Stroke (HCC)   . Symptomatic bradycardia   . Symptomatic bradycardia 2012   s/p [employer] PPM  . Syncope   . Urinary  incontinence   . Urinary urgency     Assessment: 84 y/o F here with shortness of breath, and volume overload being treated for HF responding to IV furosemide.  On warfarin PTA 2mg  daily for afib, also hx of PE.  INR at goal on admit.  INR today remains therapeutic at 2.2. H/H low stable. Plt wnl. No significant drug interactions noted.   Goal of Therapy:  INR 2-3 Monitor platelets by anticoagulation protocol: Yes   Plan:  Warfarin dose of 2 mg daily  Monitor daily INR, CBC  Thank you for involving pharmacy in this patient's care.  Vinnie Level, PharmD., BCPS Clinical Pharmacist Clinical phone for 11/10/17 until 3:30pm: [phone] If after 3:30pm, please refer to [facility] for unit-specific pharmacist

## 2017-11-10 NOTE — Consult Note (Signed)
BP 140/74 (BP Location: Left Arm)   Pulse 82   Temp 98 F (36.7 C) (Oral)   Resp (!) 22   Ht 4\' 10"  (1.473 m)   Wt 73.6 kg (162 lb 4.1 oz)   SpO2 95%   BMI 33.91 kg/m  Called to see this young lady, [name] due to back and groin pain on the right side. [name] acknowledges that she has had back pain for years. She has previously been treated by surgeons in the past. She was told seven years ago that no more surgery would be done. She has had relief with injections in the past also. This admission she had an acute pain in the right lumbar and flank region. The pain did not radiate into the lower extremities.  No new bowel and or bladder issues. She has a dense peripheral neuropathy which is painful, especially in the lower extremities.  Allergies  Allergen Reactions  . Nubain [Nalbuphine Hcl] Hives    Went into cardiac arrest   . Codeine Hives and Nausea Only    Has tolerated Oxycodone  . Darvocet [Propoxyphene N-Acetaminophen] Nausea Only   Past Medical History:  Diagnosis Date  . Acute on chronic renal failure [facility])    sees Dr Allena Katz   . Anemia    Acute blood loss  . Aortic regurgitation   . Aortic stenosis 10/13/2012   Low EF, low gradient with severe aortic stenosis confirmed by dobutamine stress echocardiogram s/p TAVR 12/2012  . Asthma   . Atrial fibrillation (HCC)    tachy-brady syndrome with <1% recurrent PAF since pacemaker placement  . Cataracts, bilateral   . Chronic combined systolic and diastolic CHF (congestive heart failure) (HCC)   . Chronic lower back pain   . CKD (chronic kidney disease)   . Coronary artery disease involving native coronary artery of native heart   . Dementia    Without behavioral disturbance  . Dysrhythmia   . Fibromyalgia   . Gastroesophageal reflux disease   . H/O dizziness   . H/O urinary frequency   . H/O: stroke   . Hard of hearing   . Headache    hx of migraines   . Heart murmur   . History of blood transfusion   .  History of bronchitis   . History of kidney stones   . History of urinary tract infection   . HLD (hyperlipidemia)   . HTN (hypertension)   . Hypothyroidism   . Neuropathy   . Nonischemic cardiomyopathy (HCC)   . On home oxygen therapy    patient uses at nite- 2L- has not used in > 6 months per patient  . Osteoarthritis   . Osteoporosis   . Pneumonia    hx of x 3   . PONV (postoperative nausea and vomiting)   . Presence of permanent cardiac pacemaker   . Pulmonary embolism (HCC)    HISTORY OF, the pt. had a recurrent bilateral pulmonary emboli in 2005, on warfarin therapy and at which time she under went implantation of IVC filter  . Repeated falls   . Rupture of right patellar tendon   . Sciatica   . Shortness of breath dyspnea    with exertion   . Sleep apnea    uses oxygen at night and PRN- not used since > 6 months / DOES NOT USE  C-PAP  . Spinal stenosis   . Stroke (HCC)   . Symptomatic bradycardia   . Symptomatic bradycardia 2012  s/p Medtronic PPM  . Syncope   . Urinary incontinence   . Urinary urgency    Past Surgical History:  Procedure Laterality Date  . ABDOMINAL HYSTERECTOMY    . APPENDECTOMY    . BACK SURGERY    . BUNIONECTOMY    . CARDIAC CATHETERIZATION    . CATARACT EXTRACTION    . CENTRAL VENOUS CATHETER INSERTION Left 12/21/2012   Procedure: INSERTION CENTRAL LINE ADULT;  Surgeon: Tonny Bollman, MD;  Location: [facility] OR;  Service: Open Heart Surgery;  Laterality: Left;  . ELBOW SURGERY     bilat   . INTRAOPERATIVE TRANSESOPHAGEAL ECHOCARDIOGRAM N/A 12/21/2012   Procedure: INTRAOPERATIVE TRANSESOPHAGEAL ECHOCARDIOGRAM;  Surgeon: Tonny Bollman, MD;  Location: [facility] OR;  Service: Open Heart Surgery;  Laterality: N/A;  . KNEE SURGERY Left   . LEFT AND RIGHT HEART CATHETERIZATION WITH CORONARY ANGIOGRAM N/A 10/18/2012   Procedure: LEFT AND RIGHT HEART CATHETERIZATION WITH CORONARY ANGIOGRAM;  Surgeon: Tonny Bollman, MD;  Location: [facility] CATH LAB;  Service:  Cardiovascular;  Laterality: N/A;  . NASAL SEPTUM SURGERY    . NOSE SURGERY     X 2  . ORIF PATELLA Right 03/08/2015   Procedure:  OPEN REDUCTION INTERNAL FIXATION RIGHT  PATELLA TENDON AVULSION;  Surgeon: Durene Romans, MD;  Location: [facility] ORS;  Service: Orthopedics;  Laterality: Right;  . PACEMAKER INSERTION     Medtronic  . PATELLAR TENDON REPAIR Right 06/25/2015   Procedure: RIGHT PATELLA TENDON REVISION/REPAIR;  Surgeon: Durene Romans, MD;  Location: [facility] ORS;  Service: Orthopedics;  Laterality: Right;  . SHOULDER SURGERY     bilat   . THYROIDECTOMY, PARTIAL    . TONSILLECTOMY    . TOTAL KNEE ARTHROPLASTY Right 12/04/2014   Procedure: RIGHT TOTAL  KNEE ARTHROPLASTY;  Surgeon: Durene Romans, MD;  Location: [facility] ORS;  Service: Orthopedics;  Laterality: Right;  . TRANSCATHETER AORTIC VALVE REPLACEMENT, TRANSFEMORAL  12/21/2012   a. 29mm Edwards Sapien XT transcatheter heart valve placed via open left transfemoral approach b. Intra-op TEE: well-seated bioprosthetic aortic valve with mean gradient 2 mmHg, trivial AI, mild MR, EF 30-35%  . TRANSCATHETER AORTIC VALVE REPLACEMENT, TRANSFEMORAL N/A 12/21/2012   Procedure: TRANSCATHETER AORTIC VALVE REPLACEMENT, TRANSFEMORAL;  Surgeon: Tonny Bollman, MD;  Location: [facility] OR;  Service: Open Heart Surgery;  Laterality: N/A;   Family History  Problem Relation Age of Onset  . Ovarian cancer Mother        Deceased  . Epilepsy Father        Deceased   Social History   Socioeconomic History  . Marital status: Divorced    Spouse name: Not on file  . Number of children: Not on file  . Years of education: Not on file  . Highest education level: Not on file  Occupational History  . Not on file  Social Needs  . Financial resource strain: Not on file  . Food insecurity:    Worry: Not on file    Inability: Not on file  . Transportation needs:    Medical: Not on file    Non-medical: Not on file  Tobacco Use  . Smoking status: Never Smoker  . Smokeless  tobacco: Never Used  Substance and Sexual Activity  . Alcohol use: No  . Drug use: No  . Sexual activity: Not Currently  Lifestyle  . Physical activity:    Days per week: Not on file    Minutes per session: Not on file  . Stress: Not on file  Relationships  .  Social connections:    Talks on phone: Not on file    Gets together: Not on file    Attends religious service: Not on file    Active member of club or organization: Not on file    Attends meetings of clubs or organizations: Not on file    Relationship status: Not on file  . Intimate partner violence:    Fear of current or ex partner: Not on file    Emotionally abused: Not on file    Physically abused: Not on file    Forced sexual activity: Not on file  Other Topics Concern  . Not on file  Social History Narrative  . Not on file   Prior to Admission medications   Medication Sig Start Date End Date Taking? Authorizing Provider  acetaminophen (TYLENOL) 325 MG tablet Take 650 mg by mouth every 4 (four) hours as needed for mild pain.   Yes [provider]  allopurinol (ZYLOPRIM) 100 MG tablet Take 100 mg by mouth daily.   Yes [provider]  amiodarone (PACERONE) 100 MG tablet Take 100 mg by mouth daily.   Yes [provider]  atorvastatin (LIPITOR) 10 MG tablet Take 10 mg by mouth daily at 6 PM.    Yes [provider]  benzonatate (TESSALON) 100 MG capsule Take 100 mg by mouth 2 (two) times daily as needed for cough.   Yes [provider]  Biotin 5000 MCG CAPS Take 5,000 mcg by mouth daily at 6 PM.   Yes [provider]  bisacodyl (DULCOLAX) 10 MG suppository Place 10 mg rectally 3 (three) times daily as needed for mild constipation.    Yes [provider]  calcitRIOL (ROCALTROL) 0.25 MCG capsule Take 0.25 mcg by mouth daily.    Yes [provider]  cholecalciferol (VITAMIN D) 1000 units tablet Take 2,000 Units by mouth at bedtime.   Yes [provider]  clobetasol cream (TEMOVATE) 0.05 % Apply 1 application topically daily.   Yes [provider]  cycloSPORINE (RESTASIS) 0.05 % ophthalmic emulsion Place 1 drop into both eyes 2 (two) times daily.    Yes [provider]  donepezil (ARICEPT) 10 MG tablet Take 10 mg by mouth at bedtime.    Yes [provider]  DULoxetine (CYMBALTA) 60 MG capsule Take 60 mg by mouth daily at 6 PM.    Yes [provider]  ferrous sulfate 325 (65 FE) MG tablet Take 325 mg by mouth daily.    Yes [provider]  gabapentin (NEURONTIN) 100 MG capsule Take 200 mg by mouth 2 (two) times daily.   Yes [provider]  hydrALAZINE (APRESOLINE) 10 MG tablet Take 1 tablet (10 mg total) by mouth 3 (three) times daily. 07/17/16  Yes Penny Pia, MD  HYDROcodone-acetaminophen (NORCO/VICODIN) 5-325 MG tablet Take 1 tablet by mouth See admin instructions. Take 1 tablet every evening at 2330 and daily as needed for pain   Yes [provider]  ipratropium-albuterol (DUONEB) 0.5-2.5 (3) MG/3ML SOLN Take 3 mLs by nebulization every 6 (six) hours as needed (wheezing/shortness of breath).   Yes [provider]  isosorbide mononitrate (IMDUR) 30 MG 24 hr tablet Take 1 tablet (30 mg total) by mouth daily. 08/05/16 11/07/17 Yes Bhagat, Bhavinkumar, PA  levothyroxine (SYNTHROID, LEVOTHROID) 112 MCG tablet Take 100 mcg by mouth daily.    Yes [provider]  linaclotide (LINZESS) 145 MCG CAPS capsule Take 290 mcg by mouth daily.  Yes [provider]  Melatonin 3 MG TABS Take 3 mg by mouth at bedtime.   Yes [provider]  Menthol, Topical Analgesic, (BIOFREEZE) 4 % GEL Apply 1 application topically 2 (two) times daily.   Yes [provider]  Menthol-Methyl Salicylate (ICY HOT) 10-30 % STCK Apply 1 application topically daily as needed (PAIN).   Yes [provider]  metolazone (ZAROXOLYN) 2.5 MG tablet Take 2.5 mg by  mouth See admin instructions. 1 tablet every Monday and Thursday if weight gain of 5 lbs or greater. 10/08/16  Yes Tonny Bollman, MD  Multiple Vitamin (MULTIVITAMIN WITH MINERALS) TABS tablet Take 1 tablet by mouth daily.   Yes [provider]  multivitamin (RENA-VIT) TABS tablet Take 1 tablet by mouth daily.    Yes [provider]  nitroGLYCERIN (NITROSTAT) 0.4 MG SL tablet Place 0.4 mg under the tongue every 5 (five) minutes as needed for chest pain (MAX 3 TABLETS).    Yes [provider]  pantoprazole (PROTONIX) 40 MG tablet Take 40 mg by mouth daily.   Yes [provider]  polyethylene glycol (MIRALAX / GLYCOLAX) packet Take 17 g by mouth 2 (two) times daily. 07/30/16  Yes Rolly Salter, MD  potassium chloride (KLOR-CON) 20 MEQ packet Take 20 mEq by mouth daily.    Yes [provider]  pramoxine (SARNA SENSITIVE) 1 % LOTN Apply 1 application topically daily as needed (itching).    Yes [provider]  SALINE NASAL MIST NA Place 1 spray into both nostrils 2 (two) times daily.   Yes [provider]  senna (SENOKOT) 8.6 MG tablet Take 1 tablet by mouth 2 (two) times daily.   Yes [provider]  sodium chloride (OCEAN) 0.65 % SOLN nasal spray Place 1 spray into both nostrils 3 (three) times daily.   Yes [provider]  torsemide (DEMADEX) 20 MG tablet Take 40 mg by mouth daily.    Yes [provider]  traZODone (DESYREL) 50 MG tablet Take 50 mg by mouth at bedtime.   Yes [provider]  warfarin (COUMADIN) 2 MG tablet Take 2 mg by mouth daily.   Yes [provider]  carvedilol (COREG) 3.125 MG tablet Take 1 tablet (3.125 mg total) by mouth 2 (two) times daily with a meal. Patient not taking: Reported on 11/07/2017 07/17/16   Penny Pia, MD  senna-docusate (SENOKOT-S) 8.6-50 MG tablet Take 1 tablet by mouth 2 (two) times daily. Patient not taking: Reported on 11/07/2017 07/30/16   Rolly Salter, MD    Current Facility-Administered Medications:  .  acetaminophen (TYLENOL) tablet 650 mg, 650 mg, Oral, Q4H PRN, Madelyn Flavors A, MD, 650 mg at 11/08/17 1903 .  allopurinol (ZYLOPRIM) tablet 100 mg, 100 mg, Oral, Daily, Smith, Rondell A, MD, 100 mg at 11/10/17 0845 .  amiodarone (PACERONE) tablet 100 mg, 100 mg, Oral, Daily, Katrinka Blazing, Rondell A, MD, 100 mg at 11/10/17 0848 .  atorvastatin (LIPITOR) tablet 10 mg, 10 mg, Oral, q1800, Smith, Rondell A, MD, 10 mg at 11/09/17 1633 .  calcitRIOL (ROCALTROL) capsule 0.25 mcg, 0.25 mcg, Oral, Daily, Katrinka Blazing, Rondell A, MD, 0.25 mcg at 11/10/17 0845 .  carvedilol (COREG) tablet 3.125 mg, 3.125 mg, Oral, BID WC, Leroy Sea, MD, 3.125 mg at 11/10/17 0848 .  cycloSPORINE (RESTASIS) 0.05 % ophthalmic emulsion 1 drop, 1 drop, Both Eyes, BID, Smith, Rondell A, MD, 1 drop at 11/10/17 0846 .  donepezil (ARICEPT) tablet 10 mg, 10  mg, Oral, QHS, Smith, Rondell A, MD, 10 mg at 11/09/17 2146 .  DULoxetine (CYMBALTA) DR capsule 60 mg, 60 mg, Oral, q1800, Madelyn Flavors A, MD, 60 mg at 11/09/17 1633 .  ferrous sulfate tablet 325 mg, 325 mg, Oral, Q breakfast, Katrinka Blazing, Rondell A, MD, 325 mg at 11/10/17 0846 .  gabapentin (NEURONTIN) capsule 200 mg, 200 mg, Oral, BID, Smith, Rondell A, MD, 200 mg at 11/10/17 0847 .  HYDROcodone-acetaminophen (NORCO/VICODIN) 5-325 MG per tablet 1 tablet, 1 tablet, Oral, Q4H PRN, Leroy Sea, MD, 1 tablet at 11/10/17 0848 .  ipratropium-albuterol (DUONEB) 0.5-2.5 (3) MG/3ML nebulizer solution 3 mL, 3 mL, Nebulization, TID, Regalado, Belkys A, MD, 3 mL at 11/10/17 0758 .  isosorbide mononitrate (IMDUR) 24 hr tablet 30 mg, 30 mg, Oral, Daily, Leroy Sea, MD, 30 mg at 11/10/17 0846 .  levothyroxine (SYNTHROID, LEVOTHROID) tablet 100 mcg, 100 mcg, Oral, QAC breakfast, Madelyn Flavors A, MD, 100 mcg at 11/10/17 0847 .  [START ON 11/11/2017] linaclotide (LINZESS) capsule 290 mcg, 290 mcg, Oral, QAC breakfast, Leroy Sea, MD .  MEDLINE mouth rinse, 15 mL, Mouth Rinse, BID, Leroy Sea, MD, 15 mL at 11/09/17 2145 .  Melatonin TABS 3 mg, 3 mg, Oral, QHS, Smith, Rondell A, MD, 3 mg at 11/09/17 2145 .  MUSCLE RUB CREA 1 application, 1 application, Topical, BID, Madelyn Flavors A, MD, 1 application at 11/10/17 0845 .  ondansetron (ZOFRAN) injection 4 mg, 4 mg, Intravenous, Q6H PRN, Smith, Rondell A, MD .  pantoprazole (PROTONIX) EC tablet 40 mg, 40 mg, Oral, Daily, Katrinka Blazing, Rondell A, MD, 40 mg at 11/10/17 0847 .  polyethylene glycol (MIRALAX / GLYCOLAX) packet 17 g, 17 g, Oral, BID, Leroy Sea, MD, 17 g at 11/09/17 3094 .  predniSONE (DELTASONE) tablet 40 mg, 40 mg, Oral, Q breakfast, Regalado, Belkys A, MD, 40 mg at 11/10/17 0848 .  senna (SENOKOT) tablet 8.6 mg, 1 tablet, Oral, BID, Smith, Rondell A, MD, 8.6 mg at 11/10/17 0848 .  sodium chloride (OCEAN) 0.65 % nasal spray 1 spray, 1 spray, Each Nare, TID, Madelyn Flavors A, MD, 1 spray at 11/10/17 0844 .  traZODone (DESYREL) tablet 50 mg, 50 mg, Oral, QHS, Smith, Rondell A, MD, 50 mg at 11/09/17 2146 .  warfarin (COUMADIN) tablet 2 mg, 2 mg, Oral, q1800, Leroy Sea, MD, 2 mg at 11/09/17 1634 .  Warfarin - Pharmacist Dosing Inpatient, , Does not apply, q1800, Leroy Sea, MD  Facility-Administered Medications Ordered in Other Encounters:  .  sodium bicarbonate first hour bolus via infusion, , Intravenous, Once **AND** [EXPIRED] sodium bicarbonate 75 mEq in dextrose 5 % 500 mL infusion, , Intravenous, to Judd Gaudier, MD  No results found. Ct Thoracic Spine Wo Contrast  Result Date: 11/08/2017 CLINICAL DATA:  Chronic diffuse thoracic and lumbar pain. No injury. EXAM: CT THORACIC AND LUMBAR SPINE WITHOUT CONTRAST TECHNIQUE: Multidetector CT imaging of the thoracic and lumbar spine was performed without contrast. Multiplanar CT image reconstructions were also generated. COMPARISON:  Chest CT 11/03/2017 and 12/14/2014 as well as  abdominopelvic CT 11/10/2012. FINDINGS: CT THORACIC SPINE FINDINGS Alignment: Subtle biphasic curvature with primary curvature convex right and secondary curvature convex left. No subluxation. Vertebrae: Vertebral body heights are maintained. There is mild spondylosis throughout the thoracic spine. Minimal right-sided neural foraminal narrowing at the T3-4 level. Mild right-sided neural foraminal narrowing at the T9-10 level. Mild bilateral neural from narrowing at the T10-11 and T11-12 levels. Minimal canal  stenosis at the T11-12 level due to posterior aspect formation and facet arthropathy. No evidence of compression fracture or subluxation. Paraspinal and other soft tissues: Paravertebral soft tissues are within normal. Calcified plaque over the thoracic aorta. Prosthetic aortic valve is present. Cardiomegaly. Mild bibasilar atelectasis and tiny amount right pleural fluid. Small hiatal hernia. Calcified plaque over the abdominal aorta. Small cyst over the upper pole right kidney. Disc levels: Moderate disc space narrowing at the T3-4 and T5-6 levels with mild disc space narrowing throughout the remainder of the thoracic spine. CT LUMBAR SPINE FINDINGS Segmentation: 5 lumbar type vertebrae. Alignment: Slight curvature convex right. Vertebrae: Vertebral body heights are normal. There is moderate spondylosis throughout the lumbar spine. There is no compression fracture or spondylolisthesis. Prominent left paravertebral osteophyte at the L2-3 level. L1-2 level demonstrates mild broad-based disc bulge. Moderate facet arthropathy throughout the lumbar spine. L2-3 level demonstrates mild canal stenosis due to broad-based disc bulge, facet arthropathy and minimal ligamentum flavum hypertrophy. L3-4 level demonstrates mild broad-based disc bulge. There is mild narrowing of the canal in the transverse dimension. Mild bilateral neural foraminal narrowing. L4-5 level demonstrates mild broad-based disc bulge. Borderline canal  stenosis. Severe bilateral neural foraminal narrowing right worse than left. L5-S1 level demonstrates broad-based disc bulge with minimal canal stenosis. Moderate to severe bilateral neural foraminal narrowing. Paraspinal and other soft tissues: Moderate calcified plaque over the abdominal aorta. Small cyst over the upper pole and midpole right kidney. IVC filter is present. Diverticulosis of the colon. Disc levels: Moderate disc space narrowing at all levels of the lumbar spine with vacuum disc phenomenon. IMPRESSION: CT THORACIC SPINE IMPRESSION No acute findings. Mild spondylosis of the thoracic spine. Multilevel disc disease and multilevel neural foraminal narrowing as described. Mild biphasic curvature. Minimal canal stenosis at the T11-12 level. CT LUMBAR SPINE IMPRESSION No acute findings. Moderate spondylosis throughout the lumbar spine with moderate multilevel degenerative disc disease. Varying degrees of canal stenosis at several levels as described. Varying degrees of neural foraminal narrowing at multiple levels worse at the L4-5 and L5-S1 levels. Minimal curvature convex right. Other ancillary findings over the thorax and abdomen/pelvis as described including aortic atherosclerosis, right renal cysts and bibasilar atelectasis as well as small amount right pleural fluid. Electronically Signed   By: Elberta Fortis M.D.   On: 11/08/2017 11:47   Ct Lumbar Spine Wo Contrast  Result Date: 11/08/2017 CLINICAL DATA:  Chronic diffuse thoracic and lumbar pain. No injury. EXAM: CT THORACIC AND LUMBAR SPINE WITHOUT CONTRAST TECHNIQUE: Multidetector CT imaging of the thoracic and lumbar spine was performed without contrast. Multiplanar CT image reconstructions were also generated. COMPARISON:  Chest CT 11/03/2017 and 12/14/2014 as well as abdominopelvic CT 11/10/2012. FINDINGS: CT THORACIC SPINE FINDINGS Alignment: Subtle biphasic curvature with primary curvature convex right and secondary curvature convex left.  No subluxation. Vertebrae: Vertebral body heights are maintained. There is mild spondylosis throughout the thoracic spine. Minimal right-sided neural foraminal narrowing at the T3-4 level. Mild right-sided neural foraminal narrowing at the T9-10 level. Mild bilateral neural from narrowing at the T10-11 and T11-12 levels. Minimal canal stenosis at the T11-12 level due to posterior aspect formation and facet arthropathy. No evidence of compression fracture or subluxation. Paraspinal and other soft tissues: Paravertebral soft tissues are within normal. Calcified plaque over the thoracic aorta. Prosthetic aortic valve is present. Cardiomegaly. Mild bibasilar atelectasis and tiny amount right pleural fluid. Small hiatal hernia. Calcified plaque over the abdominal aorta. Small cyst over the upper pole right kidney. Disc  levels: Moderate disc space narrowing at the T3-4 and T5-6 levels with mild disc space narrowing throughout the remainder of the thoracic spine. CT LUMBAR SPINE FINDINGS Segmentation: 5 lumbar type vertebrae. Alignment: Slight curvature convex right. Vertebrae: Vertebral body heights are normal. There is moderate spondylosis throughout the lumbar spine. There is no compression fracture or spondylolisthesis. Prominent left paravertebral osteophyte at the L2-3 level. L1-2 level demonstrates mild broad-based disc bulge. Moderate facet arthropathy throughout the lumbar spine. L2-3 level demonstrates mild canal stenosis due to broad-based disc bulge, facet arthropathy and minimal ligamentum flavum hypertrophy. L3-4 level demonstrates mild broad-based disc bulge. There is mild narrowing of the canal in the transverse dimension. Mild bilateral neural foraminal narrowing. L4-5 level demonstrates mild broad-based disc bulge. Borderline canal stenosis. Severe bilateral neural foraminal narrowing right worse than left. L5-S1 level demonstrates broad-based disc bulge with minimal canal stenosis. Moderate to severe  bilateral neural foraminal narrowing. Paraspinal and other soft tissues: Moderate calcified plaque over the abdominal aorta. Small cyst over the upper pole and midpole right kidney. IVC filter is present. Diverticulosis of the colon. Disc levels: Moderate disc space narrowing at all levels of the lumbar spine with vacuum disc phenomenon. IMPRESSION: CT THORACIC SPINE IMPRESSION No acute findings. Mild spondylosis of the thoracic spine. Multilevel disc disease and multilevel neural foraminal narrowing as described. Mild biphasic curvature. Minimal canal stenosis at the T11-12 level. CT LUMBAR SPINE IMPRESSION No acute findings. Moderate spondylosis throughout the lumbar spine with moderate multilevel degenerative disc disease. Varying degrees of canal stenosis at several levels as described. Varying degrees of neural foraminal narrowing at multiple levels worse at the L4-5 and L5-S1 levels. Minimal curvature convex right. Other ancillary findings over the thorax and abdomen/pelvis as described including aortic atherosclerosis, right renal cysts and bibasilar atelectasis as well as small amount right pleural fluid. Electronically Signed   By: Elberta Fortis M.D.   On: 11/08/2017 11:47   Ct Chest High Resolution  Result Date: 11/04/2017 CLINICAL DATA:  3+ months of wheezing, coughing and hemoptysis. EXAM: CT CHEST WITHOUT CONTRAST TECHNIQUE: Multidetector CT imaging of the chest was performed following the standard protocol without intravenous contrast. High resolution imaging of the lungs, as well as inspiratory and expiratory imaging, was performed. COMPARISON:  12/14/2014. FINDINGS: Cardiovascular: Atherosclerotic calcification of the arterial vasculature, including extensive three-vessel involvement of the coronary arteries. Aortic valve replacement. Heart is enlarged. No pericardial effusion. Mediastinum/Nodes: Mediastinal lymph nodes measure up to 9 mm in the low right paratracheal station, as before. Hilar  regions are difficult to evaluate without IV contrast. No axillary adenopathy. Esophagus is grossly unremarkable. Moderate hiatal hernia. Lungs/Pleura: Image quality is degraded by respiratory motion and expiratory phase imaging. Scarring in the right middle lobe, lingula and both lower lobes. No definitive subpleural reticulation, traction bronchiectasis/bronchiolectasis, ground-glass, architectural distortion or honeycombing. No pleural fluid. Airway is unremarkable. No air trapping. Upper Abdomen: Visualized portion of the liver is unremarkable. A stone is seen in the gallbladder. Visualized portions of the adrenal glands are unremarkable. Partially imaged low-attenuation lesions in the kidneys bilaterally. Visualized portions of the spleen, pancreas, stomach and bowel are grossly unremarkable with exception of a moderate hiatal hernia. Upper abdominal lymph nodes are not enlarged by CT size criteria. Musculoskeletal: Degenerative changes in the spine. No worrisome lytic or sclerotic lesions. IMPRESSION: 1. Image quality is degraded by respiratory motion and expiratory phase imaging. No definitive evidence of interstitial lung disease. 2. Aortic atherosclerosis (ICD10-170.0). Extensive three-vessel coronary artery calcification. 3. Moderate hiatal hernia.  4. Cholelithiasis. Electronically Signed   By: Leanna Battles M.D.   On: 11/04/2017 07:49   Dg Chest Portable 1 View  Result Date: 11/07/2017 CLINICAL DATA:  83 year old female with respiratory distress. EXAM: PORTABLE CHEST 1 VIEW COMPARISON:  Chest CT dated 11/03/2017 FINDINGS: There is cardiomegaly with vascular congestion and mild edema. Small left pleural effusion and left lung base atelectasis. Pneumonia is not excluded. There is no pneumothorax. Right pectoral pacemaker device and mechanical aortic valve. No acute osseous pathology. IMPRESSION: Cardiomegaly with findings of CHF and small left pleural effusion. Pneumonia is not excluded. Clinical  correlation is recommended. Electronically Signed   By: Elgie Collard M.D.   On: 11/07/2017 02:32   Physical Exam  Constitutional: She is oriented to person, place, and time. She appears well-developed and well-nourished. She appears distressed.  HENT:  Head: Normocephalic and atraumatic.  Right Ear: External ear normal.  Left Ear: External ear normal.  Eyes: Pupils are equal, round, and reactive to light. EOM are normal.  Neck: Normal range of motion. Neck supple.  Abdominal: Soft.  Musculoskeletal: Normal range of motion.  Neurological: She is alert and oriented to person, place, and time. She displays abnormal reflex. A cranial nerve deficit and sensory deficit is present. She exhibits normal muscle tone.  decreased hearing bilaterally Reflexes depressed in upper and lower extremities  Did not assess gait Assessment/plan  While [name] has significant stenosis, she is not a surgical candidate due to multiple comorbidities. No surgical intervention should ever be contemplated for the lumbar region. However she has had good response to injections, and I would recommend that this be done in the future.

## 2017-11-11 DIAGNOSIS — N189 Chronic kidney disease, unspecified: Secondary | ICD-10-CM

## 2017-11-11 DIAGNOSIS — J9621 Acute and chronic respiratory failure with hypoxia: Secondary | ICD-10-CM

## 2017-11-11 DIAGNOSIS — N179 Acute kidney failure, unspecified: Secondary | ICD-10-CM

## 2017-11-11 LAB — PROTIME-INR
INR: 2.07
Prothrombin Time: 23.1 seconds — ABNORMAL HIGH (ref 11.4–15.2)

## 2017-11-11 LAB — BASIC METABOLIC PANEL
Anion gap: 12 (ref 5–15)
BUN: 72 mg/dL — AB (ref 8–23)
CALCIUM: 9.2 mg/dL (ref 8.9–10.3)
CO2: 33 mmol/L — ABNORMAL HIGH (ref 22–32)
CREATININE: 2.5 mg/dL — AB (ref 0.44–1.00)
Chloride: 91 mmol/L — ABNORMAL LOW (ref 98–111)
GFR calc Af Amer: 19 mL/min — ABNORMAL LOW (ref >60)
GFR, EST NON AFRICAN AMERICAN: 17 mL/min — AB (ref >60)
Glucose, Bld: 137 mg/dL — ABNORMAL HIGH (ref 70–99)
Potassium: 3.9 mmol/L (ref 3.5–5.1)
Sodium: 136 mmol/L (ref 135–145)

## 2017-11-11 LAB — MAGNESIUM: Magnesium: 2 mg/dL (ref 1.7–2.4)

## 2017-11-11 MED ORDER — GUAIFENESIN-DM 100-10 MG/5ML PO SYRP
5.0000 mL | ORAL_SOLUTION | ORAL | Status: DC | PRN
  Administered 2017-11-11: 5 mL via ORAL
  Filled 2017-11-11: qty 5

## 2017-11-11 MED ORDER — IPRATROPIUM-ALBUTEROL 0.5-2.5 (3) MG/3ML IN SOLN
3.0000 mL | Freq: Two times a day (BID) | RESPIRATORY_TRACT | Status: DC
  Administered 2017-11-11 – 2017-11-12 (×2): 3 mL via RESPIRATORY_TRACT
  Filled 2017-11-11 (×2): qty 3

## 2017-11-11 NOTE — Progress Notes (Signed)
@IPLOG @        PROGRESS NOTE                                                                                                                                                                                                             Patient Demographics:    [name], is a 84 y.o. female, DOB - 04-15-1933, ZOX:[id number]  Admit date - 11/07/2017   Admitting Physician Clydie Braun, MD  Outpatient Primary MD for the patient is Pearson Grippe, MD  LOS - 4  Chief Complaint  Patient presents with  . Respiratory Distress       Brief Narrative: Patient is an 84 year old Caucasian female with past medical history significant for HTN, HLD, oxygen dependent chronic respiratory failure, CKD 4, paroxysmal A. fib, chronic combined systolic and diastolic CHF last EF 35-40% with grade 2 DD., fibromyalgia, dementia, nonischemic cardiomyopathy, DVT/PE, symptomatic bradycardia s/p PM placement, CVA, OSA, hypothyroidism; who presented from her nursing facility with acute respiratory distress.  History is limited due to patient condition. En route with EMS patient was noted to have rales in all lung fields and placed on CPAP with O2 saturations noted to be in the 80s.  She was admitted for acute on chronic hypoxic respiratory failure due to acute on chronic CHF.   With diuresis, patient has improved since significantly.  Patient also reported acute on chronic low back pain which she says has gotten worse over the last week, since she has a pacemaker CT scan T and L-spine stat has been ordered.  CAT scan of the spine revealed DJD and spinal stenosis.  Neurosurgery input is appreciated.  Conservative management has been advised.     Subjective:   No new complaints.  The patient is not yet back to the baseline.  Patient is asking for nephrology consultation.     Assessment  & Plan :     1.  Acute on chronic hypoxic respiratory failure due to acute on chronic combined systolic and diastolic CHF EF 81%.   Patient chronically on home oxygen, has been diuresed by cardiology, now creatinine has bumped on 11/10/2017, diuretics held, continue Coreg and Imdur.  No ACE/ARB due to renal insufficiency.  Much improved from CHF standpoint.  2.  ARF on CKD 4.  Baseline creatinine around 1.9 - 2.2.    3.  Paroxysmal atrial fibrillation.  [place] vas 2 score of at least 4.  Continue amiodarone, resume Coreg from tomorrow, pharmacy monitoring Coumadin dose.  4.  Dyslipidemia.  On statin  continue.  5.  Hypothyroidism.  On Synthroid.  TSH stable.  6.  Sinus syndrome.  Status post pacemaker placement.  7.  Chronic constipation.  Last BM 5 days prior to the day of admission.  Continue home medication Linzess, added MiraLAX and Dulcolax suppository.  8.  Gout.  Stable continue home dose allopurinol.  9.  Acute on Chronic back pain and peripheral neuropathy.  Continue low-dose home narcotic regimen along with Neurontin.  CT scan of T and L-spine was obtained no MRI as she has a pacemaker, CT scan reveals multilevel DJD and spinal stenosis, neurosurgery consulted who have recommended conservative management, placed on prednisone and PT, continue supportive care, her pain genuinely appears to be chronic and not acute and she does exhibit some narcotic seeking behavior, continue supportive care with PT.  10.  Single mildly elevated troponin.  Likely due to #1 above, will trend.  Check echocardiogram to evaluate wall motion and EF, continue combination of Coumadin, beta-blocker and statin for secondary prevention.  No chest pain.  Cardiology on board.    Diet :  Diet Order           Diet Heart Room service appropriate? Yes; Fluid consistency: Thin; Fluid restriction: 1500 mL Fluid  Diet effective now            Family Communication  : None present  Code Status : Full  Disposition Plan  : SNF once renal function has improved  Consults  : Cardiology, neurosurgery  Procedures  :     CT T and L-spine.   Showing multilevel DJD and spinal stenosis.    Echocardiogram - LV function appears worse from prior now 25 to 30% down from 35 to 40%. TAVR in place, with gradients slightly elevated from prior (peak 18, mean 11, AVA 1.91). Valvular AI also worse compared to prior.   DVT Prophylaxis  : Coumadin  Lab Results  Component Value Date   INR 2.07 11/11/2017   INR 2.20 11/10/2017   INR 2.57 11/09/2017     Lab Results  Component Value Date   PLT 241 11/10/2017    Inpatient Medications  Scheduled Meds: . allopurinol  100 mg Oral Daily  . amiodarone  100 mg Oral Daily  . atorvastatin  10 mg Oral q1800  . calcitRIOL  0.25 mcg Oral Daily  . carvedilol  3.125 mg Oral BID WC  . cycloSPORINE  1 drop Both Eyes BID  . donepezil  10 mg Oral QHS  . DULoxetine  60 mg Oral q1800  . ferrous sulfate  325 mg Oral Q breakfast  . gabapentin  200 mg Oral BID  . ipratropium-albuterol  3 mL Nebulization BID  . isosorbide mononitrate  30 mg Oral Daily  . levothyroxine  100 mcg Oral QAC breakfast  . linaclotide  290 mcg Oral QAC breakfast  . mouth rinse  15 mL Mouth Rinse BID  . Melatonin  3 mg Oral QHS  . MUSCLE RUB  1 application Topical BID  . pantoprazole  40 mg Oral Daily  . polyethylene glycol  17 g Oral BID  . predniSONE  40 mg Oral Q breakfast  . senna  1 tablet Oral BID  . sodium chloride  1 spray Each Nare TID  . traZODone  50 mg Oral QHS  . warfarin  2 mg Oral q1800  . Warfarin - Pharmacist Dosing Inpatient   Does not apply q1800   Continuous Infusions:  PRN Meds:.acetaminophen, HYDROcodone-acetaminophen, ondansetron (ZOFRAN) IV  Antibiotics  :    Anti-infectives (From admission, onward)   None         Objective:   Vitals:   11/11/17 0726 11/11/17 0801 11/11/17 0807 11/11/17 1200  BP:  (!) 130/53 (!) 130/53 (!) 143/93  Pulse:   77 83  Resp:  20  20  Temp:  (!) 97.4 F (36.3 C)    TempSrc:  Oral    SpO2: 97% 96%  94%  Weight:      Height:        Wt Readings from  Last 3 Encounters:  11/11/17 75 kg (165 lb 5.5 oz)  10/26/17 72.1 kg (159 lb)  01/21/17 74.8 kg (165 lb)     Intake/Output Summary (Last 24 hours) at 11/11/2017 1501 Last data filed at 11/11/2017 4098 Gross per 24 hour  Intake 120 ml  Output 600 ml  Net -480 ml     Physical Exam  General condition: Patient is not in any distress.  Patient is awake and alert.   HEENT: No pallor or jaundice.   Neck:  Supple Neck,No JVD,  Lungs: Clear to auscultation.   CVS: S1-S2.   Abdomen: Soft and nontender.  Organs are not palpable. Neuro: Patient is awake and alert.  Patient is nonfocal.  Patient moves all limbs. Extremities: No leg edema.    Data Review:    CBC Recent Labs  Lab 11/07/17 0217 11/07/17 0232 11/07/17 0409 11/08/17 0251 11/10/17 0621  WBC 14.6*  --   --  12.2* 10.4  HGB 13.4 14.3 12.9  12.9 10.8* 10.1*  HCT 41.3 42.0 38.0  38.0 31.8* 29.6*  PLT 262  --   --  226 241  MCV 101.2*  --   --  96.7 96.1  MCH 32.8  --   --  32.8 32.8  MCHC 32.4  --   --  34.0 34.1  RDW 14.1  --   --  13.7 13.2  LYMPHSABS 2.8  --   --   --   --   MONOABS 0.5  --   --   --   --   EOSABS 0.2  --   --   --   --   BASOSABS 0.1  --   --   --   --     Chemistries  Recent Labs  Lab 11/07/17 0217  11/07/17 0409 11/07/17 0418 11/08/17 0251 11/09/17 0417 11/10/17 0621 11/11/17 0317  NA 135   < > 131*  131*  --  138 135 134* 136  K 5.1   < > 7.9*  7.9* 3.8 3.2* 4.7 3.7 3.9  CL 97*   < > 98  98  --  95* 94* 91* 91*  CO2 25  --   --   --  30 30 29  33*  GLUCOSE 258*   < > 129*  129*  --  100* 94 119* 137*  BUN 41*   < > 71*  71*  --  38* 49* 58* 72*  CREATININE 2.15*   < > 2.10*  2.10*  --  1.99* 2.19* 2.38* 2.50*  CALCIUM 9.2  --   --   --  9.2 8.8* 8.9 9.2  MG  --   --   --  1.8 1.7 1.8  --  2.0   < > = values in this interval not displayed.   ------------------------------------------------------------------------------------------------------------------ No results for  input(s): CHOL, HDL, LDLCALC, TRIG, CHOLHDL, LDLDIRECT in the last 72 hours.  Lab Results  Component Value Date   HGBA1C 5.0 06/14/2016   ------------------------------------------------------------------------------------------------------------------ No results for input(s): TSH, T4TOTAL, T3FREE, THYROIDAB in the last 72 hours.  Invalid input(s): FREET3 ------------------------------------------------------------------------------------------------------------------ No results for input(s): VITAMINB12, FOLATE, FERRITIN, TIBC, IRON, RETICCTPCT in the last 72 hours.  Coagulation profile Recent Labs  Lab 11/07/17 0635 11/08/17 0834 11/09/17 0417 11/10/17 0621 11/11/17 0317  INR 2.12 2.48 2.57 2.20 2.07    No results for input(s): DDIMER in the last 72 hours.  Cardiac Enzymes Recent Labs  Lab 11/07/17 1225 11/07/17 1928  TROPONINI 0.32* 0.33*   ------------------------------------------------------------------------------------------------------------------    Component Value Date/Time   BNP 491.3 (H) 11/07/2017 0219    Micro Results Recent Results (from the past 240 hour(s))  MRSA PCR Screening     Status: Abnormal   Collection Time: 11/07/17  6:39 AM  Result Value Ref Range Status   MRSA by PCR POSITIVE (A) NEGATIVE Final    Comment: RESULT CALLED TO, READ BACK BY AND VERIFIED WITH: RN Shela Commons [facility] [id number]        The GeneXpert MRSA Assay (FDA approved for NASAL specimens only), is one component of a comprehensive MRSA colonization surveillance program. It is not intended to diagnose MRSA infection nor to guide or monitor treatment for MRSA infections.     Radiology Reports Ct Thoracic Spine Wo Contrast  Result Date: 11/08/2017 CLINICAL DATA:  Chronic diffuse thoracic and lumbar pain. No injury. EXAM: CT THORACIC AND LUMBAR SPINE WITHOUT CONTRAST TECHNIQUE: Multidetector CT imaging of the thoracic and lumbar spine was performed without contrast.  Multiplanar CT image reconstructions were also generated. COMPARISON:  Chest CT 11/03/2017 and 12/14/2014 as well as abdominopelvic CT 11/10/2012. FINDINGS: CT THORACIC SPINE FINDINGS Alignment: Subtle biphasic curvature with primary curvature convex right and secondary curvature convex left. No subluxation. Vertebrae: Vertebral body heights are maintained. There is mild spondylosis throughout the thoracic spine. Minimal right-sided neural foraminal narrowing at the T3-4 level. Mild right-sided neural foraminal narrowing at the T9-10 level. Mild bilateral neural from narrowing at the T10-11 and T11-12 levels. Minimal canal stenosis at the T11-12 level due to posterior aspect formation and facet arthropathy. No evidence of compression fracture or subluxation. Paraspinal and other soft tissues: Paravertebral soft tissues are within normal. Calcified plaque over the thoracic aorta. Prosthetic aortic valve is present. Cardiomegaly. Mild bibasilar atelectasis and tiny amount right pleural fluid. Small hiatal hernia. Calcified plaque over the abdominal aorta. Small cyst over the upper pole right kidney. Disc levels: Moderate disc space narrowing at the T3-4 and T5-6 levels with mild disc space narrowing throughout the remainder of the thoracic spine. CT LUMBAR SPINE FINDINGS Segmentation: 5 lumbar type vertebrae. Alignment: Slight curvature convex right. Vertebrae: Vertebral body heights are normal. There is moderate spondylosis throughout the lumbar spine. There is no compression fracture or spondylolisthesis. Prominent left paravertebral osteophyte at the L2-3 level. L1-2 level demonstrates mild broad-based disc bulge. Moderate facet arthropathy throughout the lumbar spine. L2-3 level demonstrates mild canal stenosis due to broad-based disc bulge, facet arthropathy and minimal ligamentum flavum hypertrophy. L3-4 level demonstrates mild broad-based disc bulge. There is mild narrowing of the canal in the transverse  dimension. Mild bilateral neural foraminal narrowing. L4-5 level demonstrates mild broad-based disc bulge. Borderline canal stenosis. Severe bilateral neural foraminal narrowing right worse than left. L5-S1 level demonstrates broad-based disc bulge with minimal canal stenosis. Moderate to severe bilateral neural foraminal narrowing. Paraspinal and other soft tissues: Moderate calcified plaque over the abdominal aorta. Small cyst over  the upper pole and midpole right kidney. IVC filter is present. Diverticulosis of the colon. Disc levels: Moderate disc space narrowing at all levels of the lumbar spine with vacuum disc phenomenon. IMPRESSION: CT THORACIC SPINE IMPRESSION No acute findings. Mild spondylosis of the thoracic spine. Multilevel disc disease and multilevel neural foraminal narrowing as described. Mild biphasic curvature. Minimal canal stenosis at the T11-12 level. CT LUMBAR SPINE IMPRESSION No acute findings. Moderate spondylosis throughout the lumbar spine with moderate multilevel degenerative disc disease. Varying degrees of canal stenosis at several levels as described. Varying degrees of neural foraminal narrowing at multiple levels worse at the L4-5 and L5-S1 levels. Minimal curvature convex right. Other ancillary findings over the thorax and abdomen/pelvis as described including aortic atherosclerosis, right renal cysts and bibasilar atelectasis as well as small amount right pleural fluid. Electronically Signed   By: Elberta Fortis M.D.   On: 11/08/2017 11:47   Ct Lumbar Spine Wo Contrast  Result Date: 11/08/2017 CLINICAL DATA:  Chronic diffuse thoracic and lumbar pain. No injury. EXAM: CT THORACIC AND LUMBAR SPINE WITHOUT CONTRAST TECHNIQUE: Multidetector CT imaging of the thoracic and lumbar spine was performed without contrast. Multiplanar CT image reconstructions were also generated. COMPARISON:  Chest CT 11/03/2017 and 12/14/2014 as well as abdominopelvic CT 11/10/2012. FINDINGS: CT THORACIC  SPINE FINDINGS Alignment: Subtle biphasic curvature with primary curvature convex right and secondary curvature convex left. No subluxation. Vertebrae: Vertebral body heights are maintained. There is mild spondylosis throughout the thoracic spine. Minimal right-sided neural foraminal narrowing at the T3-4 level. Mild right-sided neural foraminal narrowing at the T9-10 level. Mild bilateral neural from narrowing at the T10-11 and T11-12 levels. Minimal canal stenosis at the T11-12 level due to posterior aspect formation and facet arthropathy. No evidence of compression fracture or subluxation. Paraspinal and other soft tissues: Paravertebral soft tissues are within normal. Calcified plaque over the thoracic aorta. Prosthetic aortic valve is present. Cardiomegaly. Mild bibasilar atelectasis and tiny amount right pleural fluid. Small hiatal hernia. Calcified plaque over the abdominal aorta. Small cyst over the upper pole right kidney. Disc levels: Moderate disc space narrowing at the T3-4 and T5-6 levels with mild disc space narrowing throughout the remainder of the thoracic spine. CT LUMBAR SPINE FINDINGS Segmentation: 5 lumbar type vertebrae. Alignment: Slight curvature convex right. Vertebrae: Vertebral body heights are normal. There is moderate spondylosis throughout the lumbar spine. There is no compression fracture or spondylolisthesis. Prominent left paravertebral osteophyte at the L2-3 level. L1-2 level demonstrates mild broad-based disc bulge. Moderate facet arthropathy throughout the lumbar spine. L2-3 level demonstrates mild canal stenosis due to broad-based disc bulge, facet arthropathy and minimal ligamentum flavum hypertrophy. L3-4 level demonstrates mild broad-based disc bulge. There is mild narrowing of the canal in the transverse dimension. Mild bilateral neural foraminal narrowing. L4-5 level demonstrates mild broad-based disc bulge. Borderline canal stenosis. Severe bilateral neural foraminal  narrowing right worse than left. L5-S1 level demonstrates broad-based disc bulge with minimal canal stenosis. Moderate to severe bilateral neural foraminal narrowing. Paraspinal and other soft tissues: Moderate calcified plaque over the abdominal aorta. Small cyst over the upper pole and midpole right kidney. IVC filter is present. Diverticulosis of the colon. Disc levels: Moderate disc space narrowing at all levels of the lumbar spine with vacuum disc phenomenon. IMPRESSION: CT THORACIC SPINE IMPRESSION No acute findings. Mild spondylosis of the thoracic spine. Multilevel disc disease and multilevel neural foraminal narrowing as described. Mild biphasic curvature. Minimal canal stenosis at the T11-12 level. CT LUMBAR SPINE IMPRESSION  No acute findings. Moderate spondylosis throughout the lumbar spine with moderate multilevel degenerative disc disease. Varying degrees of canal stenosis at several levels as described. Varying degrees of neural foraminal narrowing at multiple levels worse at the L4-5 and L5-S1 levels. Minimal curvature convex right. Other ancillary findings over the thorax and abdomen/pelvis as described including aortic atherosclerosis, right renal cysts and bibasilar atelectasis as well as small amount right pleural fluid. Electronically Signed   By: Elberta Fortis M.D.   On: 11/08/2017 11:47   Ct Chest High Resolution  Result Date: 11/04/2017 CLINICAL DATA:  3+ months of wheezing, coughing and hemoptysis. EXAM: CT CHEST WITHOUT CONTRAST TECHNIQUE: Multidetector CT imaging of the chest was performed following the standard protocol without intravenous contrast. High resolution imaging of the lungs, as well as inspiratory and expiratory imaging, was performed. COMPARISON:  12/14/2014. FINDINGS: Cardiovascular: Atherosclerotic calcification of the arterial vasculature, including extensive three-vessel involvement of the coronary arteries. Aortic valve replacement. Heart is enlarged. No pericardial  effusion. Mediastinum/Nodes: Mediastinal lymph nodes measure up to 9 mm in the low right paratracheal station, as before. Hilar regions are difficult to evaluate without IV contrast. No axillary adenopathy. Esophagus is grossly unremarkable. Moderate hiatal hernia. Lungs/Pleura: Image quality is degraded by respiratory motion and expiratory phase imaging. Scarring in the right middle lobe, lingula and both lower lobes. No definitive subpleural reticulation, traction bronchiectasis/bronchiolectasis, ground-glass, architectural distortion or honeycombing. No pleural fluid. Airway is unremarkable. No air trapping. Upper Abdomen: Visualized portion of the liver is unremarkable. A stone is seen in the gallbladder. Visualized portions of the adrenal glands are unremarkable. Partially imaged low-attenuation lesions in the kidneys bilaterally. Visualized portions of the spleen, pancreas, stomach and bowel are grossly unremarkable with exception of a moderate hiatal hernia. Upper abdominal lymph nodes are not enlarged by CT size criteria. Musculoskeletal: Degenerative changes in the spine. No worrisome lytic or sclerotic lesions. IMPRESSION: 1. Image quality is degraded by respiratory motion and expiratory phase imaging. No definitive evidence of interstitial lung disease. 2. Aortic atherosclerosis (ICD10-170.0). Extensive three-vessel coronary artery calcification. 3. Moderate hiatal hernia. 4. Cholelithiasis. Electronically Signed   By: Leanna Battles M.D.   On: 11/04/2017 07:49   Dg Chest Portable 1 View  Result Date: 11/07/2017 CLINICAL DATA:  84 year old female with respiratory distress. EXAM: PORTABLE CHEST 1 VIEW COMPARISON:  Chest CT dated 11/03/2017 FINDINGS: There is cardiomegaly with vascular congestion and mild edema. Small left pleural effusion and left lung base atelectasis. Pneumonia is not excluded. There is no pneumothorax. Right pectoral pacemaker device and mechanical aortic valve. No acute osseous  pathology. IMPRESSION: Cardiomegaly with findings of CHF and small left pleural effusion. Pneumonia is not excluded. Clinical correlation is recommended. Electronically Signed   By: Elgie Collard M.D.   On: 11/07/2017 02:32    Time Spent - 36 Minutes    Barnetta Chapel M.D on 11/11/2017 at 3:01 PM  To page go to [email or web address] - password [email or web address]

## 2017-11-11 NOTE — Progress Notes (Signed)
Progress Note  Patient Name: [name] Date of Encounter: 11/11/2017  Primary Cardiologist: Tonny Bollman, MD   Subjective   No changes today.  No shortness of breath, stable, no chest pain.  Inpatient Medications    Scheduled Meds: . allopurinol  100 mg Oral Daily  . amiodarone  100 mg Oral Daily  . atorvastatin  10 mg Oral q1800  . calcitRIOL  0.25 mcg Oral Daily  . carvedilol  3.125 mg Oral BID WC  . cycloSPORINE  1 drop Both Eyes BID  . donepezil  10 mg Oral QHS  . DULoxetine  60 mg Oral q1800  . ferrous sulfate  325 mg Oral Q breakfast  . gabapentin  200 mg Oral BID  . ipratropium-albuterol  3 mL Nebulization BID  . isosorbide mononitrate  30 mg Oral Daily  . levothyroxine  100 mcg Oral QAC breakfast  . linaclotide  290 mcg Oral QAC breakfast  . mouth rinse  15 mL Mouth Rinse BID  . Melatonin  3 mg Oral QHS  . MUSCLE RUB  1 application Topical BID  . pantoprazole  40 mg Oral Daily  . polyethylene glycol  17 g Oral BID  . predniSONE  40 mg Oral Q breakfast  . senna  1 tablet Oral BID  . sodium chloride  1 spray Each Nare TID  . traZODone  50 mg Oral QHS  . warfarin  2 mg Oral q1800  . Warfarin - Pharmacist Dosing Inpatient   Does not apply q1800   Continuous Infusions:  PRN Meds: acetaminophen, HYDROcodone-acetaminophen, ondansetron (ZOFRAN) IV   Vital Signs    Vitals:   11/11/17 0409 11/11/17 0726 11/11/17 0801 11/11/17 0807  BP:   (!) 130/53 (!) 130/53  Pulse:    77  Resp:   20   Temp:   (!) 97.4 F (36.3 C)   TempSrc:   Oral   SpO2:  97% 96%   Weight: 165 lb 5.5 oz (75 kg)     Height:        Intake/Output Summary (Last 24 hours) at 11/11/2017 0946 Last data filed at 11/11/2017 6415 Gross per 24 hour  Intake 120 ml  Output 1300 ml  Net -1180 ml   Filed Weights   11/09/17 0304 11/10/17 0441 11/11/17 0409  Weight: 162 lb 4.1 oz (73.6 kg) 162 lb 4.1 oz (73.6 kg) 165 lb 5.5 oz (75 kg)    Telemetry    No adverse arrhythmias-  Personally Reviewed  ECG    No new- Personally Reviewed  Physical Exam   GEN: Well nourished, well developed, in no acute distress  HEENT: normal  Neck: no JVD, carotid bruits, or masses Cardiac: RRR; no murmurs, rubs, or gallops,no edema  Respiratory:  clear to auscultation bilaterally, normal work of breathing GI: soft, nontender, nondistended, + BS MS: no deformity or atrophy  Skin: warm and dry, no rash Neuro:  Alert and Oriented x 3, Strength and sensation are intact Psych: euthymic mood, full affect   Labs    Chemistry Recent Labs  Lab 11/09/17 0417 11/10/17 0621 11/11/17 0317  NA 135 134* 136  K 4.7 3.7 3.9  CL 94* 91* 91*  CO2 30 29 33*  GLUCOSE 94 119* 137*  BUN 49* 58* 72*  CREATININE 2.19* 2.38* 2.50*  CALCIUM 8.8* 8.9 9.2  GFRNONAA 20* 18* 17*  GFRAA 23* 21* 19*  ANIONGAP 11 14 12      Hematology Recent Labs  Lab 11/07/17 0217  11/07/17 0409 11/08/17 0251 11/10/17 0621  WBC 14.6*  --   --  12.2* 10.4  RBC 4.08  --   --  3.29* 3.08*  HGB 13.4   < > 12.9  12.9 10.8* 10.1*  HCT 41.3   < > 38.0  38.0 31.8* 29.6*  MCV 101.2*  --   --  96.7 96.1  MCH 32.8  --   --  32.8 32.8  MCHC 32.4  --   --  34.0 34.1  RDW 14.1  --   --  13.7 13.2  PLT 262  --   --  226 241   < > = values in this interval not displayed.    Cardiac Enzymes Recent Labs  Lab 11/07/17 1225 11/07/17 1928  TROPONINI 0.32* 0.33*    Recent Labs  Lab 11/07/17 0230  TROPIPOC 0.16*     BNP Recent Labs  Lab 11/07/17 0219  BNP 491.3*     DDimer No results for input(s): DDIMER in the last 168 hours.   Radiology    No results found.  Cardiac Studies   - Left ventricle: The cavity size was moderately dilated. Wall   thickness was increased in a pattern of mild LVH. Systolic   function was severely reduced. The estimated ejection fraction   was in the range of 25% to 30%. Diffuse hypokinesis, with   akinesis of specific walls below. Features are consistent with a    pseudonormal left ventricular filling pattern, with concomitant   abnormal relaxation and increased filling pressure (grade 2   diastolic dysfunction). - Regional wall motion abnormality: Akinesis of the mid   anteroseptal, basal-mid inferoseptal, and mid inferior   myocardium; hypokinesis of the mid anterior, basal anteroseptal,   apical septal, apical lateral, and apical myocardium. - Aortic valve: A prosthesis was present and functioning normally.   The prosthesis had a normal range of motion. The sewing ring   appeared normal, had no rocking motion, and showed no evidence of   dehiscence. There was mild regurgitation. Mean gradient (S): 11   mm Hg. Peak gradient (S): 18 mm Hg. Valve area (VTI): 1.91 cm^2.   Valve area (Vmax): 1.73 cm^2. Valve area (Vmean): 1.71 cm^2. - Mitral valve: Mobility was restricted. There was mild   regurgitation directed eccentrically. - Left atrium: The atrium was moderately dilated. - Atrial septum: No defect or patent foramen ovale was identified. - Tricuspid valve: There was mild regurgitation. - Pulmonic valve: There was mild regurgitation. - Pulmonary arteries: PA peak pressure: 32 mm Hg (S).  Impressions:  - LV function appears worse from prior. TAVR in place, with   gradients slightly elevated from prior (peak 18, mean 11, AVA   1.91). Valvular AI also worse compared to prior.  Patient Profile     84 y.o. female with systolic heart failure acute on chronic, post TAVR.  Assessment & Plan    Acute on chronic systolic heart failure - Weight from 170 down to 165. -Originally oxygen saturations were in the 80s. -Stopped IV lasix on 11/10/2017 since BUN and creat increased.  Continue to increase today carvedilol low-dose, isosorbide, not on ACE inhibitor because of creatinine.  Not on spironolactone because of creatinine.  Risk of hyperkalemia.  BUN and creatinine should stabilize over the next several days following cessation of  Lasix.  TAVR -Mild aortic regurgitation.  Slightly worse hemodynamic gradients.  Stable with no changes  Paroxysmal atrial fibrillation - Carvedilol amiodarone Coumadin.  No changes made, stable  CKD 4 -Worsening creatinine after diuresis.  Consider consulting Dr. Allena Katz her nephrologist.  She was anxious about this.  For questions or updates, please contact [facility] Please consult [email or web address] for contact info under Cardiology/STEMI.      Signed, Donato Schultz, MD  11/11/2017, 9:46 AM

## 2017-11-11 NOTE — Plan of Care (Signed)

## 2017-11-11 NOTE — Consult Note (Signed)
Referring Provider: No ref. provider found Primary Care Physician:  Pearson Grippe, MD Primary Nephrologist:  Dr. Allena Katz   Reason for Consultation:   Acute on chronic renal disease  Congestive heart failure with systolic dysfunction and EF 90%  HPI:  84 year old lady with history of hypertension chronic oxygen dependence and CKD stage 4  History of aortic valve replacement, atrial fibrillation chronic systolic and diastolic heart failure  She has fibromyalgia and dementia and non ischemic cardiomyopathy , DVT  Symptomatic bradycardia and pacemaker placement  CVA and hypothyroidism. She presented June 19th with acute respiratory distress and found to have decompensated heart failure. Her baseline creatinine is about 2.0 during the during the hospitalization she has improved from using BIPAP to nasal canula with about 2L oxygen  She has had very good urine output with a negative balance of about 3 L    She has maintained good hemodynamics during this time  She was given 80mg  twice a day of lasix from 7/20 - 7/23 and this was discontinued  She has also had a weight decrease 170 lbs to 165 lbs     Past Medical History:  Diagnosis Date  . Acute on chronic renal failure [facility])    sees Dr Allena Katz   . Anemia    Acute blood loss  . Aortic regurgitation   . Aortic stenosis 10/13/2012   Low EF, low gradient with severe aortic stenosis confirmed by dobutamine stress echocardiogram s/p TAVR 12/2012  . Asthma   . Atrial fibrillation (HCC)    tachy-brady syndrome with <1% recurrent PAF since pacemaker placement  . Cataracts, bilateral   . Chronic combined systolic and diastolic CHF (congestive heart failure) (HCC)   . Chronic lower back pain   . CKD (chronic kidney disease)   . Coronary artery disease involving native coronary artery of native heart   . Dementia    Without behavioral disturbance  . Dysrhythmia   . Fibromyalgia   . Gastroesophageal reflux disease   . H/O dizziness   . H/O urinary frequency    . H/O: stroke   . Hard of hearing   . Headache    hx of migraines   . Heart murmur   . History of blood transfusion   . History of bronchitis   . History of kidney stones   . History of urinary tract infection   . HLD (hyperlipidemia)   . HTN (hypertension)   . Hypothyroidism   . Neuropathy   . Nonischemic cardiomyopathy (HCC)   . On home oxygen therapy    patient uses at nite- 2L- has not used in > 6 months per patient  . Osteoarthritis   . Osteoporosis   . Pneumonia    hx of x 3   . PONV (postoperative nausea and vomiting)   . Presence of permanent cardiac pacemaker   . Pulmonary embolism (HCC)    HISTORY OF, the pt. had a recurrent bilateral pulmonary emboli in 2005, on warfarin therapy and at which time she under went implantation of IVC filter  . Repeated falls   . Rupture of right patellar tendon   . Sciatica   . Shortness of breath dyspnea    with exertion   . Sleep apnea    uses oxygen at night and PRN- not used since > 6 months / DOES NOT USE  C-PAP  . Spinal stenosis   . Stroke (HCC)   . Symptomatic bradycardia   . Symptomatic bradycardia 2012  s/p Medtronic PPM  . Syncope   . Urinary incontinence   . Urinary urgency     Past Surgical History:  Procedure Laterality Date  . ABDOMINAL HYSTERECTOMY    . APPENDECTOMY    . BACK SURGERY    . BUNIONECTOMY    . CARDIAC CATHETERIZATION    . CATARACT EXTRACTION    . CENTRAL VENOUS CATHETER INSERTION Left 12/21/2012   Procedure: INSERTION CENTRAL LINE ADULT;  Surgeon: Tonny Bollman, MD;  Location: [facility] OR;  Service: Open Heart Surgery;  Laterality: Left;  . ELBOW SURGERY     bilat   . INTRAOPERATIVE TRANSESOPHAGEAL ECHOCARDIOGRAM N/A 12/21/2012   Procedure: INTRAOPERATIVE TRANSESOPHAGEAL ECHOCARDIOGRAM;  Surgeon: Tonny Bollman, MD;  Location: [facility] OR;  Service: Open Heart Surgery;  Laterality: N/A;  . KNEE SURGERY Left   . LEFT AND RIGHT HEART CATHETERIZATION WITH CORONARY ANGIOGRAM N/A 10/18/2012   Procedure: LEFT  AND RIGHT HEART CATHETERIZATION WITH CORONARY ANGIOGRAM;  Surgeon: Tonny Bollman, MD;  Location: [facility] CATH LAB;  Service: Cardiovascular;  Laterality: N/A;  . NASAL SEPTUM SURGERY    . NOSE SURGERY     X 2  . ORIF PATELLA Right 03/08/2015   Procedure:  OPEN REDUCTION INTERNAL FIXATION RIGHT  PATELLA TENDON AVULSION;  Surgeon: Durene Romans, MD;  Location: [facility] ORS;  Service: Orthopedics;  Laterality: Right;  . PACEMAKER INSERTION     Medtronic  . PATELLAR TENDON REPAIR Right 06/25/2015   Procedure: RIGHT PATELLA TENDON REVISION/REPAIR;  Surgeon: Durene Romans, MD;  Location: [facility] ORS;  Service: Orthopedics;  Laterality: Right;  . SHOULDER SURGERY     bilat   . THYROIDECTOMY, PARTIAL    . TONSILLECTOMY    . TOTAL KNEE ARTHROPLASTY Right 12/04/2014   Procedure: RIGHT TOTAL  KNEE ARTHROPLASTY;  Surgeon: Durene Romans, MD;  Location: [facility] ORS;  Service: Orthopedics;  Laterality: Right;  . TRANSCATHETER AORTIC VALVE REPLACEMENT, TRANSFEMORAL  12/21/2012   a. 29mm Edwards Sapien XT transcatheter heart valve placed via open left transfemoral approach b. Intra-op TEE: well-seated bioprosthetic aortic valve with mean gradient 2 mmHg, trivial AI, mild MR, EF 30-35%  . TRANSCATHETER AORTIC VALVE REPLACEMENT, TRANSFEMORAL N/A 12/21/2012   Procedure: TRANSCATHETER AORTIC VALVE REPLACEMENT, TRANSFEMORAL;  Surgeon: Tonny Bollman, MD;  Location: [facility] OR;  Service: Open Heart Surgery;  Laterality: N/A;    Prior to Admission medications   Medication Sig Start Date End Date Taking? Authorizing Provider  acetaminophen (TYLENOL) 325 MG tablet Take 650 mg by mouth every 4 (four) hours as needed for mild pain.   Yes [provider]  allopurinol (ZYLOPRIM) 100 MG tablet Take 100 mg by mouth daily.   Yes [provider]  amiodarone (PACERONE) 100 MG tablet Take 100 mg by mouth daily.   Yes [provider]  atorvastatin (LIPITOR) 10 MG tablet Take 10 mg by mouth daily at 6 PM.    Yes [provider]  benzonatate (TESSALON) 100 MG capsule Take 100 mg by mouth 2 (two) times daily as needed for cough.   Yes [provider]  Biotin 5000 MCG CAPS Take 5,000 mcg by mouth daily at 6 PM.   Yes [provider]  bisacodyl (DULCOLAX) 10 MG suppository Place 10 mg rectally 3 (three) times daily as needed for mild constipation.    Yes [provider]  calcitRIOL (ROCALTROL) 0.25 MCG capsule Take 0.25 mcg by mouth daily.    Yes [provider]  cholecalciferol (VITAMIN D) 1000 units tablet Take 2,000 Units by  mouth at bedtime.   Yes [provider]  clobetasol cream (TEMOVATE) 0.05 % Apply 1 application topically daily.   Yes [provider]  cycloSPORINE (RESTASIS) 0.05 % ophthalmic emulsion Place 1 drop into both eyes 2 (two) times daily.    Yes [provider]  donepezil (ARICEPT) 10 MG tablet Take 10 mg by mouth at bedtime.    Yes [provider]  DULoxetine (CYMBALTA) 60 MG capsule Take 60 mg by mouth daily at 6 PM.    Yes [provider]  ferrous sulfate 325 (65 FE) MG tablet Take 325 mg by mouth daily.    Yes [provider]  gabapentin (NEURONTIN) 100 MG capsule Take 200 mg by mouth 2 (two) times daily.   Yes [provider]  hydrALAZINE (APRESOLINE) 10 MG tablet Take 1 tablet (10 mg total) by mouth 3 (three) times daily. 07/17/16  Yes Penny Pia, MD  HYDROcodone-acetaminophen (NORCO/VICODIN) 5-325 MG tablet Take 1 tablet by mouth See admin instructions. Take 1 tablet every evening at 2330 and daily as needed for pain   Yes [provider]  ipratropium-albuterol (DUONEB) 0.5-2.5 (3) MG/3ML SOLN Take 3 mLs by nebulization every 6 (six) hours as needed (wheezing/shortness of breath).   Yes [provider]  isosorbide mononitrate (IMDUR) 30 MG 24 hr tablet Take 1 tablet (30 mg total) by mouth daily. 08/05/16 11/07/17 Yes Bhagat, Bhavinkumar, PA  levothyroxine (SYNTHROID, LEVOTHROID) 112  MCG tablet Take 100 mcg by mouth daily.    Yes [provider]  linaclotide (LINZESS) 145 MCG CAPS capsule Take 290 mcg by mouth daily.    Yes [provider]  Melatonin 3 MG TABS Take 3 mg by mouth at bedtime.   Yes [provider]  Menthol, Topical Analgesic, (BIOFREEZE) 4 % GEL Apply 1 application topically 2 (two) times daily.   Yes [provider]  Menthol-Methyl Salicylate (ICY HOT) 10-30 % STCK Apply 1 application topically daily as needed (PAIN).   Yes [provider]  metolazone (ZAROXOLYN) 2.5 MG tablet Take 2.5 mg by mouth See admin instructions. 1 tablet every Monday and Thursday if weight gain of 5 lbs or greater. 10/08/16  Yes Tonny Bollman, MD  Multiple Vitamin (MULTIVITAMIN WITH MINERALS) TABS tablet Take 1 tablet by mouth daily.   Yes [provider]  multivitamin (RENA-VIT) TABS tablet Take 1 tablet by mouth daily.    Yes [provider]  nitroGLYCERIN (NITROSTAT) 0.4 MG SL tablet Place 0.4 mg under the tongue every 5 (five) minutes as needed for chest pain (MAX 3 TABLETS).    Yes [provider]  pantoprazole (PROTONIX) 40 MG tablet Take 40 mg by mouth daily.   Yes [provider]  polyethylene glycol (MIRALAX / GLYCOLAX) packet Take 17 g by mouth 2 (two) times daily. 07/30/16  Yes Rolly Salter, MD  potassium chloride (KLOR-CON) 20 MEQ packet Take 20 mEq by mouth daily.    Yes [provider]  pramoxine (SARNA SENSITIVE) 1 % LOTN Apply 1 application topically daily as needed (itching).    Yes [provider]  SALINE NASAL MIST NA Place 1 spray into both nostrils 2 (two) times daily.   Yes [provider]  senna (SENOKOT) 8.6 MG tablet Take 1 tablet by mouth 2 (two) times daily.   Yes [provider]  sodium chloride (OCEAN) 0.65 % SOLN nasal spray Place 1 spray into both nostrils 3 (three) times daily.   Yes [provider]  torsemide (DEMADEX) 20 MG  tablet Take 40 mg by mouth daily.    Yes [provider]  traZODone (DESYREL) 50 MG tablet Take 50 mg by mouth at bedtime.   Yes [provider]  warfarin (COUMADIN) 2 MG tablet Take 2 mg by mouth daily.   Yes [provider]  carvedilol (COREG) 3.125 MG tablet Take 1 tablet (3.125 mg total) by mouth 2 (two) times daily with a meal. Patient not taking: Reported on 11/07/2017 07/17/16   Penny Pia, MD  senna-docusate (SENOKOT-S) 8.6-50 MG tablet Take 1 tablet by mouth 2 (two) times daily. Patient not taking: Reported on 11/07/2017 07/30/16   Rolly Salter, MD    Current Facility-Administered Medications  Medication Dose Route Frequency Provider Last Rate Last Dose  . acetaminophen (TYLENOL) tablet 650 mg  650 mg Oral Q4H PRN Madelyn Flavors A, MD   650 mg at 11/11/17 1502  . allopurinol (ZYLOPRIM) tablet 100 mg  100 mg Oral Daily Madelyn Flavors A, MD   100 mg at 11/11/17 0814  . amiodarone (PACERONE) tablet 100 mg  100 mg Oral Daily Katrinka Blazing, Rondell A, MD   100 mg at 11/11/17 0816  . atorvastatin (LIPITOR) tablet 10 mg  10 mg Oral q1800 Madelyn Flavors A, MD   10 mg at 11/10/17 1653  . calcitRIOL (ROCALTROL) capsule 0.25 mcg  0.25 mcg Oral Daily Madelyn Flavors A, MD   0.25 mcg at 11/11/17 0811  . carvedilol (COREG) tablet 3.125 mg  3.125 mg Oral BID WC Leroy Sea, MD   3.125 mg at 11/11/17 0807  . cycloSPORINE (RESTASIS) 0.05 % ophthalmic emulsion 1 drop  1 drop Both Eyes BID Madelyn Flavors A, MD   1 drop at 11/11/17 1112  . donepezil (ARICEPT) tablet 10 mg  10 mg Oral QHS Madelyn Flavors A, MD   10 mg at 11/10/17 2058  . DULoxetine (CYMBALTA) DR capsule 60 mg  60 mg Oral q1800 Madelyn Flavors A, MD   60 mg at 11/10/17 1653  . ferrous sulfate tablet 325 mg  325 mg Oral Q breakfast Madelyn Flavors A, MD   325 mg at 11/11/17 1610  . gabapentin (NEURONTIN) capsule 200 mg  200 mg Oral BID Madelyn Flavors A, MD   200 mg at 11/11/17 0814  . HYDROcodone-acetaminophen  (NORCO/VICODIN) 5-325 MG per tablet 1 tablet  1 tablet Oral Q4H PRN Leroy Sea, MD   1 tablet at 11/11/17 0402  . ipratropium-albuterol (DUONEB) 0.5-2.5 (3) MG/3ML nebulizer solution 3 mL  3 mL Nebulization BID Berton Mount I, MD      . isosorbide mononitrate (IMDUR) 24 hr tablet 30 mg  30 mg Oral Daily Leroy Sea, MD   30 mg at 11/11/17 1109  . levothyroxine (SYNTHROID, LEVOTHROID) tablet 100 mcg  100 mcg Oral QAC breakfast Madelyn Flavors A, MD   100 mcg at 11/11/17 [phone]  . linaclotide (LINZESS) capsule 290 mcg  290 mcg Oral QAC breakfast Leroy Sea, MD   290 mcg at 11/11/17 0809  . MEDLINE mouth rinse  15 mL Mouth Rinse BID Leroy Sea, MD   15 mL at 11/10/17 2059  . Melatonin TABS 3 mg  3 mg Oral QHS Madelyn Flavors A, MD   3 mg at 11/10/17 2058  . MUSCLE RUB CREA 1 application  1 application Topical BID Clydie Braun, MD   1 application at 11/10/17 0845  . ondansetron (ZOFRAN) injection 4 mg  4 mg Intravenous Q6H  PRN Madelyn Flavors A, MD      . pantoprazole (PROTONIX) EC tablet 40 mg  40 mg Oral Daily Madelyn Flavors A, MD   40 mg at 11/11/17 0813  . polyethylene glycol (MIRALAX / GLYCOLAX) packet 17 g  17 g Oral BID Leroy Sea, MD   17 g at 11/11/17 0818  . predniSONE (DELTASONE) tablet 40 mg  40 mg Oral Q breakfast Regalado, Belkys A, MD   40 mg at 11/11/17 0810  . senna (SENOKOT) tablet 8.6 mg  1 tablet Oral BID Madelyn Flavors A, MD   8.6 mg at 11/11/17 4098  . sodium chloride (OCEAN) 0.65 % nasal spray 1 spray  1 spray Each Nare TID Clydie Braun, MD   1 spray at 11/11/17 0819  . traZODone (DESYREL) tablet 50 mg  50 mg Oral QHS Madelyn Flavors A, MD   50 mg at 11/10/17 2058  . warfarin (COUMADIN) tablet 2 mg  2 mg Oral q1800 Leroy Sea, MD   2 mg at 11/10/17 1653  . Warfarin - Pharmacist Dosing Inpatient   Does not apply q1800 Leroy Sea, MD       Facility-Administered Medications Ordered in Other Encounters  Medication Dose Route  Frequency Provider Last Rate Last Dose  . sodium bicarbonate first hour bolus via infusion   Intravenous Once Pearson Grippe, MD        Allergies as of 11/07/2017 - Review Complete 11/07/2017  Allergen Reaction Noted  . Nubain [nalbuphine hcl] Hives 07/11/2010  . Codeine Hives and Nausea Only 07/11/2010  . Darvocet [propoxyphene n-acetaminophen] Nausea Only 07/11/2010    Family History  Problem Relation Age of Onset  . Ovarian cancer Mother        Deceased  . Epilepsy Father        Deceased    Social History   Socioeconomic History  . Marital status: Divorced    Spouse name: Not on file  . Number of children: Not on file  . Years of education: Not on file  . Highest education level: Not on file  Occupational History  . Not on file  Social Needs  . Financial resource strain: Not on file  . Food insecurity:    Worry: Not on file    Inability: Not on file  . Transportation needs:    Medical: Not on file    Non-medical: Not on file  Tobacco Use  . Smoking status: Never Smoker  . Smokeless tobacco: Never Used  Substance and Sexual Activity  . Alcohol use: No  . Drug use: No  . Sexual activity: Not Currently  Lifestyle  . Physical activity:    Days per week: Not on file    Minutes per session: Not on file  . Stress: Not on file  Relationships  . Social connections:    Talks on phone: Not on file    Gets together: Not on file    Attends religious service: Not on file    Active member of club or organization: Not on file    Attends meetings of clubs or organizations: Not on file    Relationship status: Not on file  . Intimate partner violence:    Fear of current or ex partner: Not on file    Emotionally abused: Not on file    Physically abused: Not on file    Forced sexual activity: Not on file  Other Topics Concern  . Not on file  Social History Narrative  .  Not on file    Review of Systems: Gen:  , +  anorexia,  +fatigue, + weakness,   HEENT: No visual  complaints, No history of Retinopathy. Normal external appearance No Epistaxis or Sore throat. No sinusitis.   CV:  History of systolic heart failure  EF 25 %  Resp:  Increased dyspnea at rest and to exercise GI: Denies vomiting blood, jaundice, and fecal incontinence.   Denies dysphagia or odynophagia. GU : Denies urinary burning, blood in urine, urinary frequency, urinary hesitancy, nocturnal urination, and urinary incontinence.  No renal calculi. MS:  Admits to lower back pain  No nsaids  Derm: Denies rash, itching, dry skin, hives, moles, warts, or unhealing ulcers.  Psych: Denies depression, anxiety, memory loss, suicidal ideation, hallucinations, paranoia, and confusion. Heme: Denies bruising, bleeding, and enlarged lymph nodes. Neuro: No headache.  No diplopia. No dysarthria.  No dysphasia.    history of CVA.  No Seizures. No paresthesias.  No weakness. Endocrine No DM. Thyroid disease.  No Adrenal disease.  Physical Exam: Vital signs in last 24 hours: Temp:  [96.1 F (35.6 C)-97.9 F (36.6 C)] 97.4 F (36.3 C) (07/24 0801) Pulse Rate:  [65-83] 83 (07/24 1200) Resp:  [18-20] 20 (07/24 1200) BP: (130-143)/(53-93) 143/93 (07/24 1200) SpO2:  [94 %-100 %] 94 % (07/24 1200) Weight:  [165 lb 5.5 oz (75 kg)] 165 lb 5.5 oz (75 kg) (07/24 0409) Last BM Date: 11/09/17 General:    Ill appearing thin lady  Head:  Normocephalic and atraumatic. Eyes:  Sclera clear, no icterus.   Conjunctiva pink. Ears:  Normal auditory acuity. Nose:  No deformity, discharge,  or lesions. Mouth:  No deformity or lesions, dentition normal. Neck:  Supple; no masses or thyromegaly. JVP  mild Lungs:   Some diminished breath sounds at bases Heart:  Regular rate and rhythm; no murmurs, clicks, rubs,  or gallops. Abdomen:  Soft, nontender and nondistended. No masses, hepatosplenomegaly or hernias noted. Normal bowel sounds, without guarding, and without rebound.   Msk:  Symmetrical without gross deformities.  Normal posture. Pulses:  No carotid, renal, femoral bruits. DP and PT symmetrical and equal Extremities:  Without clubbing or edema. Neurologic:  Alert and  oriented x4;  grossly normal neurologically. Skin:  Intact without significant lesions or rashes.  .  Intake/Output from previous day: 07/23 0701 - 07/24 0700 In: 240 [P.O.:240] Out: 1300 [Urine:1300] Intake/Output this shift: No intake/output data recorded.  Lab Results: Recent Labs    11/10/17 0621  WBC 10.4  HGB 10.1*  HCT 29.6*  PLT 241   BMET Recent Labs    11/09/17 0417 11/10/17 0621 11/11/17 0317  NA 135 134* 136  K 4.7 3.7 3.9  CL 94* 91* 91*  CO2 30 29 33*  GLUCOSE 94 119* 137*  BUN 49* 58* 72*  CREATININE 2.19* 2.38* 2.50*  CALCIUM 8.8* 8.9 9.2   LFT No results for input(s): PROT, ALBUMIN, AST, ALT, ALKPHOS, BILITOT, BILIDIR, IBILI in the last 72 hours. PT/INR Recent Labs    11/10/17 0621 11/11/17 0317  LABPROT 24.2* 23.1*  INR 2.20 2.07   Hepatitis Panel No results for input(s): HEPBSAG, HCVAB, HEPAIGM, HEPBIGM in the last 72 hours.  Studies/Results: No results found.  Assessment/Plan:  1. Chronic renal failure  Secondary to nephrosclerosis with stable renal function  Creatinine appears to be about 2 at baseline she probably has a cardiorenal component with no use of ACE of ARB and no use of NSAIDS it appears that she  is maintained on torsemide 40 mg daily as an outpatient. I would probable restart this dose of torsemide. She does have a PRN dose of metolazone for 5 lb fluid gain in 24 hrs 2. Acute on chronic renal failure  The acute changes appear to be related to diuresis and decreased renal perfusion.  This may be a new baseline. I am ok with this level of creatinine and even though it is a change, I think that the aggressive diuresis has helped with the pulmonary edema and a repeat CXR would be reasonable 3. Congestive heart failure  EF 25 % will restart home dose of torsemide 4, Mild  aortic regurgitation s/p TAVR   5. Paroxysmal atrial fibrillation  Coumadin therapy 6. Lumbar  Spondylosis appreciate neurosurgical input    LOS: 4 Ezme Duch W @TODAY @3 :59 PM

## 2017-11-11 NOTE — Progress Notes (Signed)
ANTICOAGULATION CONSULT NOTE - Follow Up Consult  Pharmacy Consult for Warfarin  Indication: atrial fibrillation  Patient Measurements: Height: 4\' 10"  (147.3 cm) Weight: 165 lb 5.5 oz (75 kg) IBW/kg (Calculated) : 40.9 Vital Signs: Temp: 97.4 F (36.3 C) (07/24 0801) Temp Source: Oral (07/24 0801) BP: 143/93 (07/24 1200) Pulse Rate: 83 (07/24 1200)  Labs: Recent Labs    11/09/17 0417 11/10/17 0621 11/11/17 0317  HGB  --  10.1*  --   HCT  --  29.6*  --   PLT  --  241  --   LABPROT 27.4* 24.2* 23.1*  INR 2.57 2.20 2.07  CREATININE 2.19* 2.38* 2.50*    Estimated Creatinine Clearance: 14.7 mL/min (A) (by C-G formula based on SCr of 2.5 mg/dL (H)).   Medical History: Past Medical History:  Diagnosis Date  . Acute on chronic renal failure [facility])    sees Dr Allena Katz   . Anemia    Acute blood loss  . Aortic regurgitation   . Aortic stenosis 10/13/2012   Low EF, low gradient with severe aortic stenosis confirmed by dobutamine stress echocardiogram s/p TAVR 12/2012  . Asthma   . Atrial fibrillation (HCC)    tachy-brady syndrome with <1% recurrent PAF since pacemaker placement  . Cataracts, bilateral   . Chronic combined systolic and diastolic CHF (congestive heart failure) (HCC)   . Chronic lower back pain   . CKD (chronic kidney disease)   . Coronary artery disease involving native coronary artery of native heart   . Dementia    Without behavioral disturbance  . Dysrhythmia   . Fibromyalgia   . Gastroesophageal reflux disease   . H/O dizziness   . H/O urinary frequency   . H/O: stroke   . Hard of hearing   . Headache    hx of migraines   . Heart murmur   . History of blood transfusion   . History of bronchitis   . History of kidney stones   . History of urinary tract infection   . HLD (hyperlipidemia)   . HTN (hypertension)   . Hypothyroidism   . Neuropathy   . Nonischemic cardiomyopathy (HCC)   . On home oxygen therapy    patient uses at nite- 2L- has not  used in > 6 months per patient  . Osteoarthritis   . Osteoporosis   . Pneumonia    hx of x 3   . PONV (postoperative nausea and vomiting)   . Presence of permanent cardiac pacemaker   . Pulmonary embolism (HCC)    HISTORY OF, the pt. had a recurrent bilateral pulmonary emboli in 2005, on warfarin therapy and at which time she under went implantation of IVC filter  . Repeated falls   . Rupture of right patellar tendon   . Sciatica   . Shortness of breath dyspnea    with exertion   . Sleep apnea    uses oxygen at night and PRN- not used since > 6 months / DOES NOT USE  C-PAP  . Spinal stenosis   . Stroke (HCC)   . Symptomatic bradycardia   . Symptomatic bradycardia 2012   s/p Medtronic PPM  . Syncope   . Urinary incontinence   . Urinary urgency     Assessment: 84 y/o F here with shortness of breath, and volume overload being treated for HF responding to IV furosemide.  On warfarin PTA 2mg  daily for afib, also hx of PE.  INR at goal on admit.  INR today remains therapeutic at 2.07. No new CBC. No significant drug interactions noted.   Goal of Therapy:  INR 2-3 Monitor platelets by anticoagulation protocol: Yes   Plan:  Warfarin dose of 2 mg daily  Monitor daily INR, CBC  Thank you for involving pharmacy in this patient's care.  Vinnie Level, PharmD., BCPS Clinical Pharmacist Clinical phone for 11/10/17 until 3:30pm: [phone] If after 3:30pm, please refer to [facility] for unit-specific pharmacist

## 2017-11-12 DIAGNOSIS — J9601 Acute respiratory failure with hypoxia: Secondary | ICD-10-CM

## 2017-11-12 DIAGNOSIS — Z952 Presence of prosthetic heart valve: Secondary | ICD-10-CM

## 2017-11-12 LAB — PROTIME-INR
INR: 1.82
Prothrombin Time: 20.9 seconds — ABNORMAL HIGH (ref 11.4–15.2)

## 2017-11-12 LAB — RENAL FUNCTION PANEL
Albumin: 3.1 g/dL — ABNORMAL LOW (ref 3.5–5.0)
Anion gap: 10 (ref 5–15)
BUN: 68 mg/dL — ABNORMAL HIGH (ref 8–23)
CO2: 30 mmol/L (ref 22–32)
Calcium: 9.1 mg/dL (ref 8.9–10.3)
Chloride: 94 mmol/L — ABNORMAL LOW (ref 98–111)
Creatinine, Ser: 2.24 mg/dL — ABNORMAL HIGH (ref 0.44–1.00)
GFR calc Af Amer: 22 mL/min — ABNORMAL LOW (ref >60)
GFR calc non Af Amer: 19 mL/min — ABNORMAL LOW (ref >60)
Glucose, Bld: 115 mg/dL — ABNORMAL HIGH (ref 70–99)
Phosphorus: 4 mg/dL (ref 2.5–4.6)
Potassium: 4.2 mmol/L (ref 3.5–5.1)
Sodium: 134 mmol/L — ABNORMAL LOW (ref 135–145)

## 2017-11-12 MED ORDER — CHLORHEXIDINE GLUCONATE CLOTH 2 % EX PADS
6.0000 | MEDICATED_PAD | Freq: Every day | CUTANEOUS | Status: DC
  Administered 2017-11-12: 6 via TOPICAL

## 2017-11-12 MED ORDER — MUPIROCIN 2 % EX OINT
1.0000 "application " | TOPICAL_OINTMENT | Freq: Two times a day (BID) | CUTANEOUS | Status: DC
  Administered 2017-11-12: 1 via NASAL
  Filled 2017-11-12: qty 22

## 2017-11-12 MED ORDER — WARFARIN SODIUM 2 MG PO TABS: 2.0000 mg | ORAL_TABLET | Freq: Every day | ORAL | Status: DC

## 2017-11-12 MED ORDER — WARFARIN SODIUM 4 MG PO TABS
4.0000 mg | ORAL_TABLET | Freq: Once | ORAL | Status: AC
  Administered 2017-11-12: 4 mg via ORAL
  Filled 2017-11-12: qty 1

## 2017-11-12 MED ORDER — PREDNISONE 10 MG PO TABS: ORAL_TABLET | ORAL | 0 refills | Status: DC

## 2017-11-12 MED ORDER — HYDROCODONE-ACETAMINOPHEN 5-325 MG PO TABS: 1.0000 | ORAL_TABLET | ORAL | 0 refills | Status: DC | PRN

## 2017-11-12 MED ORDER — LEVOTHYROXINE SODIUM 100 MCG PO TABS: 100.0000 ug | ORAL_TABLET | Freq: Every day | ORAL | 0 refills | Status: AC

## 2017-11-12 MED ORDER — MUPIROCIN 2 % EX OINT: 1.0000 "application " | TOPICAL_OINTMENT | Freq: Two times a day (BID) | CUTANEOUS | 0 refills | Status: AC

## 2017-11-12 MED ORDER — ALBUTEROL SULFATE (2.5 MG/3ML) 0.083% IN NEBU: 2.5000 mg | INHALATION_SOLUTION | RESPIRATORY_TRACT | Status: DC | PRN

## 2017-11-12 NOTE — Progress Notes (Signed)
Physical Therapy Treatment Patient Details Name: [name] MRN: [id number] DOB: 04-11-1933 Today's Date: 11/12/2017    History of Present Illness Pt is a 84 y.o. F with significant PMH of HTN, HLD, oxygen dependent chronic respiratory failure, CKD, a.fib, chronic combined systolic and diastolic CHF last EF 35-40% with grade 2 DD., fibryomyalgia, dementia, nonischemic cardiomyopathy, DVT/PE, symptomatic bradycardia s/p PM placement, CVA, OSA, hypothyroidism who presented from nursing facility with acute respiratory distress.     PT Comments    Pt admitted with above diagnosis. Pt currently with functional limitations due to the deficits listed below (see PT Problem List). Pt was able to ambulate incr distance today.  Same posture with flexed trunk but did improve distance.  Also pt did not need O2 as sats >90% on RA.   Pt will benefit from skilled PT to increase their independence and safety with mobility to allow discharge to the venue listed below.     Follow Up Recommendations  SNF     Equipment Recommendations  None recommended by PT    Recommendations for Other Services       Precautions / Restrictions Precautions Precautions: Fall Restrictions Weight Bearing Restrictions: No    Mobility  Bed Mobility Overal bed mobility: Needs Assistance Bed Mobility: Supine to Sit     Supine to sit: Supervision     General bed mobility comments: Increased time and effort  Transfers Overall transfer level: Needs assistance Equipment used: Rolling walker (2 wheeled) Transfers: Sit to/from Stand Sit to Stand: Min guard         General transfer comment: Needed incr time to come to standing and get her balance.  Ambulation/Gait Ambulation/Gait assistance: Min guard;Min assist Gait Distance (Feet): 30 Feet Assistive device: Rolling walker (2 wheeled) Gait Pattern/deviations: Step-to pattern;Trunk flexed;Decreased stride length   Gait velocity interpretation: <1.31  ft/sec, indicative of household ambulator General Gait Details: Patient with trunk flexed and poor proximity to walker with ambulation.  Pt with worsened posture as she fatigued.  Needed cues and assist to steer RW.   Stairs             Wheelchair Mobility    Modified Rankin (Stroke Patients Only)       Balance Overall balance assessment: Needs assistance Sitting-balance support: No upper extremity supported;Feet supported Sitting balance-Leahy Scale: Good     Standing balance support: Bilateral upper extremity supported Standing balance-Leahy Scale: Poor Standing balance comment: reliant on RW and external support                            Cognition Arousal/Alertness: Awake/alert Behavior During Therapy: WFL for tasks assessed/performed Overall Cognitive Status: History of cognitive impairments - at baseline                                 General Comments: History of dementia. Patient very pleasant and cooperative and follows simple commands consistently.       Exercises      General Comments        Pertinent Vitals/Pain Pain Assessment: No/denies pain   VSS Home Living                      Prior Function            PT Goals (current goals can now be found in the care plan section) Progress towards PT  goals: Progressing toward goals    Frequency    Min 2X/week      PT Plan Current plan remains appropriate    Co-evaluation              AM-PAC PT "6 Clicks" Daily Activity  Outcome Measure  Difficulty turning over in bed (including adjusting bedclothes, sheets and blankets)?: None Difficulty moving from lying on back to sitting on the side of the bed? : None Difficulty sitting down on and standing up from a chair with arms (e.g., wheelchair, bedside commode, etc,.)?: A Little Help needed moving to and from a bed to chair (including a wheelchair)?: A Little Help needed walking in hospital room?: A  Little Help needed climbing 3-5 steps with a railing? : A Lot 6 Click Score: 19    End of Session Equipment Utilized During Treatment: Gait belt Activity Tolerance: Patient limited by fatigue Patient left: in chair;with call bell/phone within reach;with chair alarm set Nurse Communication: Mobility status PT Visit Diagnosis: Muscle weakness (generalized) (M62.81);Difficulty in walking, not elsewhere classified (R26.2);Pain Pain - part of body: (back)     Time: 1610-9604 PT Time Calculation (min) (ACUTE ONLY): 17 min  Charges:  $Gait Training: 8-22 mins                     [facility] [phone] (pager)    Berline Lopes 11/12/2017, 12:09 PM

## 2017-11-12 NOTE — Progress Notes (Signed)
Progress Note  Patient Name: [name] Date of Encounter: 11/12/2017  Primary Cardiologist: Tonny Bollman, MD   Subjective   Seems to be improving, breathing improved.  No chest pain.  Inpatient Medications    Scheduled Meds: . allopurinol  100 mg Oral Daily  . amiodarone  100 mg Oral Daily  . atorvastatin  10 mg Oral q1800  . calcitRIOL  0.25 mcg Oral Daily  . carvedilol  3.125 mg Oral BID WC  . Chlorhexidine Gluconate Cloth  6 each Topical Q0600  . cycloSPORINE  1 drop Both Eyes BID  . donepezil  10 mg Oral QHS  . DULoxetine  60 mg Oral q1800  . ferrous sulfate  325 mg Oral Q breakfast  . gabapentin  200 mg Oral BID  . ipratropium-albuterol  3 mL Nebulization BID  . isosorbide mononitrate  30 mg Oral Daily  . levothyroxine  100 mcg Oral QAC breakfast  . linaclotide  290 mcg Oral QAC breakfast  . mouth rinse  15 mL Mouth Rinse BID  . Melatonin  3 mg Oral QHS  . mupirocin ointment  1 application Nasal BID  . MUSCLE RUB  1 application Topical BID  . pantoprazole  40 mg Oral Daily  . polyethylene glycol  17 g Oral BID  . predniSONE  40 mg Oral Q breakfast  . senna  1 tablet Oral BID  . sodium chloride  1 spray Each Nare TID  . traZODone  50 mg Oral QHS  . warfarin  2 mg Oral q1800  . Warfarin - Pharmacist Dosing Inpatient   Does not apply q1800   Continuous Infusions:  PRN Meds: acetaminophen, guaiFENesin-dextromethorphan, HYDROcodone-acetaminophen, ondansetron (ZOFRAN) IV   Vital Signs    Vitals:   11/12/17 0422 11/12/17 0728 11/12/17 0805 11/12/17 0834  BP:   (!) 171/89 (!) 156/75  Pulse: 76  82 81  Resp: 16  (!) 24   Temp: (!) 97.3 F (36.3 C)  97.7 F (36.5 C)   TempSrc: Oral  Oral   SpO2: 98% 100% 99%   Weight: 167 lb 1.7 oz (75.8 kg)     Height:        Intake/Output Summary (Last 24 hours) at 11/12/2017 1200 Last data filed at 11/12/2017 0425 Gross per 24 hour  Intake -  Output 1350 ml  Net -1350 ml   Filed Weights   11/10/17 0441  11/11/17 0409 11/12/17 0422  Weight: 162 lb 4.1 oz (73.6 kg) 165 lb 5.5 oz (75 kg) 167 lb 1.7 oz (75.8 kg)    Telemetry    normal - Personally Reviewed  ECG    NSR - Personally Reviewed  Physical Exam   GEN: No acute distress.   Neck: No JVD Cardiac: RRR, no murmurs, rubs, or gallops.  Respiratory: Clear to auscultation bilaterally. GI: Soft, nontender, non-distended  MS: No edema; No deformity. Neuro:  Nonfocal  Psych: Normal affect   Labs    Chemistry Recent Labs  Lab 11/10/17 0621 11/11/17 0317 11/12/17 0329  NA 134* 136 134*  K 3.7 3.9 4.2  CL 91* 91* 94*  CO2 29 33* 30  GLUCOSE 119* 137* 115*  BUN 58* 72* 68*  CREATININE 2.38* 2.50* 2.24*  CALCIUM 8.9 9.2 9.1  ALBUMIN  --   --  3.1*  GFRNONAA 18* 17* 19*  GFRAA 21* 19* 22*  ANIONGAP 14 12 10      Hematology Recent Labs  Lab 11/07/17 0217  11/07/17 0409 11/08/17 0251 11/10/17  6160  WBC 14.6*  --   --  12.2* 10.4  RBC 4.08  --   --  3.29* 3.08*  HGB 13.4   < > 12.9  12.9 10.8* 10.1*  HCT 41.3   < > 38.0  38.0 31.8* 29.6*  MCV 101.2*  --   --  96.7 96.1  MCH 32.8  --   --  32.8 32.8  MCHC 32.4  --   --  34.0 34.1  RDW 14.1  --   --  13.7 13.2  PLT 262  --   --  226 241   < > = values in this interval not displayed.    Cardiac Enzymes Recent Labs  Lab 11/07/17 1225 11/07/17 1928  TROPONINI 0.32* 0.33*    Recent Labs  Lab 11/07/17 0230  TROPIPOC 0.16*     BNP Recent Labs  Lab 11/07/17 0219  BNP 491.3*     DDimer No results for input(s): DDIMER in the last 168 hours.   Radiology    No results found.  Cardiac Studies   EF 25%, TAVR  Patient Profile     84 y.o. female post TAVR, CHF  Assessment & Plan     Acute on chronic systolic heart failure EF 25% - Weight from 170 down to 167. -Originally oxygen saturations were in the 80s. Improved -Stopped IV lasix on 11/10/2017 since BUN and creat increased.  Continue  carvedilol low-dose, isosorbide, not on ACE inhibitor  because of creatinine.  Not on spironolactone because of creatinine.  Risk of hyperkalemia.  BUN and creatinine should stabilize over the next several days following cessation of Lasix. -Per Dr. Hyman Hopes, nephrology, restarting torsemide.  Dr. Allena Katz came by and visited her as well.  She was very pleased.  TAVR -Mild aortic regurgitation.  Slightly worse hemodynamic gradients.  Stable issue.  Seems to be working properly.  Paroxysmal atrial fibrillation - Carvedilol amiodarone Coumadin.  No changes made, stable  CKD 4 -Worsening creatinine after diuresis.  Now mildly improved. Renal note reviewed.  At this point, I am comfortable with her discharge.   [facility] will sign off.   Medication Recommendations:  Torsemide per renal . Other recommendations (labs, testing, etc):  none Follow up as an outpatient:  As previously scheduled.   For questions or updates, please contact [facility] Please consult [email or web address] for contact info under Cardiology/STEMI.      Signed, Donato Schultz, MD  11/12/2017, 12:00 PM

## 2017-11-12 NOTE — Progress Notes (Signed)
ANTICOAGULATION CONSULT NOTE - Follow Up Consult  Pharmacy Consult for Warfarin  Indication: atrial fibrillation  Patient Measurements: Height: 4\' 10"  (147.3 cm) Weight: 167 lb 1.7 oz (75.8 kg) IBW/kg (Calculated) : 40.9 Vital Signs: Temp: 97.7 F (36.5 C) (07/25 0805) Temp Source: Oral (07/25 0805) BP: 156/75 (07/25 0834) Pulse Rate: 81 (07/25 0834)  Labs: Recent Labs    11/10/17 0621 11/11/17 0317 11/12/17 0329  HGB 10.1*  --   --   HCT 29.6*  --   --   PLT 241  --   --   LABPROT 24.2* 23.1* 20.9*  INR 2.20 2.07 1.82  CREATININE 2.38* 2.50* 2.24*    Estimated Creatinine Clearance: 16.5 mL/min (A) (by C-G formula based on SCr of 2.24 mg/dL (H)).   Medical History: Past Medical History:  Diagnosis Date  . Acute on chronic renal failure [facility])    sees Dr Allena Katz   . Anemia    Acute blood loss  . Aortic regurgitation   . Aortic stenosis 10/13/2012   Low EF, low gradient with severe aortic stenosis confirmed by dobutamine stress echocardiogram s/p TAVR 12/2012  . Asthma   . Atrial fibrillation (HCC)    tachy-brady syndrome with <1% recurrent PAF since pacemaker placement  . Cataracts, bilateral   . Chronic combined systolic and diastolic CHF (congestive heart failure) (HCC)   . Chronic lower back pain   . CKD (chronic kidney disease)   . Coronary artery disease involving native coronary artery of native heart   . Dementia    Without behavioral disturbance  . Dysrhythmia   . Fibromyalgia   . Gastroesophageal reflux disease   . H/O dizziness   . H/O urinary frequency   . H/O: stroke   . Hard of hearing   . Headache    hx of migraines   . Heart murmur   . History of blood transfusion   . History of bronchitis   . History of kidney stones   . History of urinary tract infection   . HLD (hyperlipidemia)   . HTN (hypertension)   . Hypothyroidism   . Neuropathy   . Nonischemic cardiomyopathy (HCC)   . On home oxygen therapy    patient uses at nite- 2L- has not  used in > 6 months per patient  . Osteoarthritis   . Osteoporosis   . Pneumonia    hx of x 3   . PONV (postoperative nausea and vomiting)   . Presence of permanent cardiac pacemaker   . Pulmonary embolism (HCC)    HISTORY OF, the pt. had a recurrent bilateral pulmonary emboli in 2005, on warfarin therapy and at which time she under went implantation of IVC filter  . Repeated falls   . Rupture of right patellar tendon   . Sciatica   . Shortness of breath dyspnea    with exertion   . Sleep apnea    uses oxygen at night and PRN- not used since > 6 months / DOES NOT USE  C-PAP  . Spinal stenosis   . Stroke (HCC)   . Symptomatic bradycardia   . Symptomatic bradycardia 2012   s/p [employer] PPM  . Syncope   . Urinary incontinence   . Urinary urgency     Assessment: 84 y/o F here with shortness of breath, and volume overload being treated for HF responding to IV furosemide.  On warfarin PTA 2mg  daily for afib, also hx of PE.  INR at goal on admit.  INR today had trended down to 1.82. No new CBC. No significant drug interactions noted.   Goal of Therapy:  INR 2-3 Monitor platelets by anticoagulation protocol: Yes   Plan:  Warfarin 4 mg x 1 dose today  Will plan to return to home regimen tomorrow Planning discharge today Monitor daily INR, CBC  Thank you for involving pharmacy in this patient's care.  Vinnie Level, PharmD., BCPS Clinical Pharmacist Clinical phone for 11/12/17 until 3:30pm: [phone] If after 3:30pm, please refer to [facility] for unit-specific pharmacist

## 2017-11-12 NOTE — Progress Notes (Addendum)
Clinical Social Worker facilitated patient discharge including contacting patient family and facility to confirm patient discharge plans.  Clinical information faxed to facility and family agreeable with plan.  Per RN patients son will transport patient back to [facility].  RN to [phone] (will go in room 905A) for call report prior to discharge.  Clinical Social Worker will sign off for now as social work intervention is no longer needed. Please consult [place] again if new need arises.  Marrianne Mood, MSW, [employer] [phone]

## 2017-11-12 NOTE — Progress Notes (Signed)
Pt educated to discharge instructions. Paper prescriptions also give. IV removed and intact. All belongings with pt. Pt discharged via wheelchair to son. Lacy Duverney, RN

## 2017-11-12 NOTE — Discharge Summary (Addendum)
Physician Discharge Summary  Patient ID: [name] MRN: [id number] DOB/AGE: 1933/01/29 84 y.o.  Admit date: 11/07/2017 Discharge date: 11/12/2017  Admission Diagnoses:  Discharge Diagnoses:  Active Problems: Acute on chronic combined systolic and diastolic congestive heart failure. Acute on chronic hypoxemic respiratory failure S/P TAVR (transcatheter aortic valve replacement)   Dementia without behavioral disturbance   Hypothyroidism Severe back pain Spinal stenosis   Elevated troponin   Acute kidney injury superimposed on chronic kidney disease (HCC)   Discharged Condition: stable  Hospital Course:  Patient is an 84 year old Caucasian female with past medical history significant forhypertension, hyperlipidemia, oxygen dependent chronic respiratory failure, CKD 4, paroxysmalA. fib, chronic combined systolic and diastolicCHF last EF 35-40% with grade 2diastolic dysfunction, fibromyalgia, dementia, nonischemic cardiomyopathy, DVT/PE, symptomatic bradycardias/p PMplacement, CVA, OSA, hypothyroidism;who presented from her nursing facility with acute respiratory distress. History is limited due to patient condition. En route with EMS patient was noted to have rales in all lung fields and placed on CPAPwithO2 saturations noted to be in the 80s.  She was admitted, and managed for acute on chronic hypoxic respiratory failure due to acute on chronic combined systolic and diastolic diastolic congestive heart failure   With diuresis, patient improved significantly.    There was a slight increase in patient's serum creatinine, but this is improved back to his baseline.  Respiratory failure has resolved.  Acute diastolic congestive heart failure has resolved.    Patient also reported acute on chronic low back pain which she says has gotten worse over the last week, since patient has a pacemaker, CT scan T and L-spine were done.  CT scan of the spine revealed DJD and spinal stenosis.   Neurosurgery input is appreciated.  Conservative management has been advised.  Patient will also need to follow-up with neurosurgery team on discharge.  Patient will be discharged on tapering dose of prednisone.   Consults: cardiology, nephrology, neurosurgery  Discharge Exam: Blood pressure (!) 156/75, pulse 81, temperature 97.7 F (36.5 C), temperature source Oral, resp. rate (!) 24, height 4\' 10"  (1.473 m), weight 75.8 kg (167 lb 1.7 oz), SpO2 99 %.   Disposition: Discharge disposition: 03-Skilled Nursing Facility   Discharge Instructions    Call MD for:   Complete by:  As directed    Please call MD for worsening of symptoms   Diet - low sodium heart healthy   Complete by:  As directed    Increase activity slowly   Complete by:  As directed      Allergies as of 11/12/2017      Reactions   Nubain [nalbuphine Hcl] Hives   Went into cardiac arrest    Codeine Hives, Nausea Only   Has tolerated Oxycodone   Darvocet [propoxyphene N-acetaminophen] Nausea Only      Medication List    STOP taking these medications   acetaminophen 325 MG tablet Commonly known as:  TYLENOL   Biotin 5000 MCG Caps   bisacodyl 10 MG suppository Commonly known as:  DULCOLAX   carvedilol 3.125 MG tablet Commonly known as:  COREG   clobetasol cream 0.05 % Commonly known as:  TEMOVATE   hydrALAZINE 10 MG tablet Commonly known as:  APRESOLINE   ICY HOT 10-30 % Stck   Melatonin 3 MG Tabs   metolazone 2.5 MG tablet Commonly known as:  ZAROXOLYN   multivitamin with minerals Tabs tablet   pantoprazole 40 MG tablet Commonly known as:  PROTONIX   polyethylene glycol packet Commonly known as:  MIRALAX /  GLYCOLAX   potassium chloride 20 MEQ packet Commonly known as:  KLOR-CON   pramoxine 1 % Lotn Commonly known as:  SARNA SENSITIVE   SALINE NASAL MIST NA   senna 8.6 MG tablet Commonly known as:  SENOKOT   senna-docusate 8.6-50 MG tablet Commonly known as:  Senokot-S   sodium  chloride 0.65 % Soln nasal spray Commonly known as:  OCEAN   traZODone 50 MG tablet Commonly known as:  DESYREL     TAKE these medications   allopurinol 100 MG tablet Commonly known as:  ZYLOPRIM Take 100 mg by mouth daily.   amiodarone 100 MG tablet Commonly known as:  PACERONE Take 100 mg by mouth daily.   atorvastatin 10 MG tablet Commonly known as:  LIPITOR Take 10 mg by mouth daily at 6 PM.   benzonatate 100 MG capsule Commonly known as:  TESSALON Take 100 mg by mouth 2 (two) times daily as needed for cough.   BIOFREEZE 4 % Gel Generic drug:  Menthol (Topical Analgesic) Apply 1 application topically 2 (two) times daily.   calcitRIOL 0.25 MCG capsule Commonly known as:  ROCALTROL Take 0.25 mcg by mouth daily.   cholecalciferol 1000 units tablet Commonly known as:  VITAMIN D Take 2,000 Units by mouth at bedtime.   cycloSPORINE 0.05 % ophthalmic emulsion Commonly known as:  RESTASIS Place 1 drop into both eyes 2 (two) times daily.   donepezil 10 MG tablet Commonly known as:  ARICEPT Take 10 mg by mouth at bedtime.   DULoxetine 60 MG capsule Commonly known as:  CYMBALTA Take 60 mg by mouth daily at 6 PM.   ferrous sulfate 325 (65 FE) MG tablet Take 325 mg by mouth daily.   gabapentin 100 MG capsule Commonly known as:  NEURONTIN Take 200 mg by mouth 2 (two) times daily.   HYDROcodone-acetaminophen 5-325 MG tablet Commonly known as:  NORCO/VICODIN Take 1 tablet by mouth every 4 (four) hours as needed for severe pain. What changed:    when to take this  reasons to take this  additional instructions   ipratropium-albuterol 0.5-2.5 (3) MG/3ML Soln Commonly known as:  DUONEB Take 3 mLs by nebulization every 6 (six) hours as needed (wheezing/shortness of breath).   isosorbide mononitrate 30 MG 24 hr tablet Commonly known as:  IMDUR Take 1 tablet (30 mg total) by mouth daily.   levothyroxine 100 MCG tablet Commonly known as:  SYNTHROID,  LEVOTHROID Take 1 tablet (100 mcg total) by mouth daily before breakfast. What changed:    medication strength  when to take this   LINZESS 145 MCG Caps capsule Generic drug:  linaclotide Take 290 mcg by mouth daily.   multivitamin Tabs tablet Take 1 tablet by mouth daily.   mupirocin ointment 2 % Commonly known as:  BACTROBAN Place 1 application into the nose 2 (two) times daily.   nitroGLYCERIN 0.4 MG SL tablet Commonly known as:  NITROSTAT Place 0.4 mg under the tongue every 5 (five) minutes as needed for chest pain (MAX 3 TABLETS).   predniSONE 10 MG tablet Commonly known as:  DELTASONE 40 mg po once daily for 3 days, then 30 mg po once daily for 3 days, then 20 mg po once daily for 3 days and then 10 mg po once daily for 3 days and stop.   torsemide 20 MG tablet Commonly known as:  DEMADEX Take 40 mg by mouth daily.   warfarin 2 MG tablet Commonly known as:  COUMADIN Take 2  mg by mouth daily.      Contact information for after-discharge care    Destination    [facility] .   Service:  Skilled Nursing Contact information: [address] [place] [address] [phone]              Signed: Barnetta Chapel 11/12/2017, 12:50 PM

## 2017-11-12 NOTE — Progress Notes (Signed)
[facility] ROUNDING NOTE   Subjective:   Interval History: no complaints this morning  Appears to be breathing well  She is on 2 L with 93 % sats   Objective:  Vital signs in last 24 hours:  Temp:  [97.3 F (36.3 C)-98 F (36.7 C)] 97.7 F (36.5 C) (07/25 0805) Pulse Rate:  [63-93] 81 (07/25 0834) Resp:  [16-27] 24 (07/25 0805) BP: (133-179)/(52-93) 156/75 (07/25 0834) SpO2:  [94 %-100 %] 99 % (07/25 0805) Weight:  [167 lb 1.7 oz (75.8 kg)] 167 lb 1.7 oz (75.8 kg) (07/25 0422)  Weight change: 1 lb 12.2 oz (0.8 kg) Filed Weights   11/10/17 0441 11/11/17 0409 11/12/17 0422  Weight: 162 lb 4.1 oz (73.6 kg) 165 lb 5.5 oz (75 kg) 167 lb 1.7 oz (75.8 kg)    Intake/Output: I/O last 3 completed shifts: In: 120 [P.O.:120] Out: 1950 [Urine:1950]   Intake/Output this shift:  No intake/output data recorded.  CVS- RRR RS- CTA  Some diminished breath sounds at base  ABD- BS present soft non-distended EXT- no edema   Basic Metabolic Panel: Recent Labs  Lab 11/07/17 0418 11/08/17 0251 11/09/17 0417 11/10/17 0621 11/11/17 0317 11/12/17 0329  NA  --  138 135 134* 136 134*  K 3.8 3.2* 4.7 3.7 3.9 4.2  CL  --  95* 94* 91* 91* 94*  CO2  --  30 30 29  33* 30  GLUCOSE  --  100* 94 119* 137* 115*  BUN  --  38* 49* 58* 72* 68*  CREATININE  --  1.99* 2.19* 2.38* 2.50* 2.24*  CALCIUM  --  9.2 8.8* 8.9 9.2 9.1  MG 1.8 1.7 1.8  --  2.0  --   PHOS  --   --   --   --   --  4.0    Liver Function Tests: Recent Labs  Lab 11/12/17 0329  ALBUMIN 3.1*   No results for input(s): LIPASE, AMYLASE in the last 168 hours. No results for input(s): AMMONIA in the last 168 hours.  CBC: Recent Labs  Lab 11/07/17 0217 11/07/17 0232 11/07/17 0409 11/08/17 0251 11/10/17 0621  WBC 14.6*  --   --  12.2* 10.4  NEUTROABS 11.0*  --   --   --   --   HGB 13.4 14.3 12.9  12.9 10.8* 10.1*  HCT 41.3 42.0 38.0  38.0 31.8* 29.6*  MCV 101.2*  --   --  96.7 96.1  PLT 262  --   --   226 241    Cardiac Enzymes: Recent Labs  Lab 11/07/17 1225 11/07/17 1928  TROPONINI 0.32* 0.33*    BNP: Invalid input(s): POCBNP  CBG: No results for input(s): GLUCAP in the last 168 hours.  Microbiology: Results for orders placed or performed during the hospital encounter of 11/07/17  MRSA PCR Screening     Status: Abnormal   Collection Time: 11/07/17  6:39 AM  Result Value Ref Range Status   MRSA by PCR POSITIVE (A) NEGATIVE Final    Comment: RESULT CALLED TO, READ BACK BY AND VERIFIED WITH: RN Shela Commons [facility] [id number]        The GeneXpert MRSA Assay (FDA approved for NASAL specimens only), is one component of a comprehensive MRSA colonization surveillance program. It is not intended to diagnose MRSA infection nor to guide or monitor treatment for MRSA infections.     Coagulation Studies: Recent Labs    11/10/17 0621 11/11/17 0317 11/12/17 0329  LABPROT  24.2* 23.1* 20.9*  INR 2.20 2.07 1.82    Urinalysis: No results for input(s): COLORURINE, LABSPEC, PHURINE, GLUCOSEU, HGBUR, BILIRUBINUR, KETONESUR, PROTEINUR, UROBILINOGEN, NITRITE, LEUKOCYTESUR in the last 72 hours.  Invalid input(s): APPERANCEUR    Imaging: No results found.   Medications:    . allopurinol  100 mg Oral Daily  . amiodarone  100 mg Oral Daily  . atorvastatin  10 mg Oral q1800  . calcitRIOL  0.25 mcg Oral Daily  . carvedilol  3.125 mg Oral BID WC  . Chlorhexidine Gluconate Cloth  6 each Topical Q0600  . cycloSPORINE  1 drop Both Eyes BID  . donepezil  10 mg Oral QHS  . DULoxetine  60 mg Oral q1800  . ferrous sulfate  325 mg Oral Q breakfast  . gabapentin  200 mg Oral BID  . ipratropium-albuterol  3 mL Nebulization BID  . isosorbide mononitrate  30 mg Oral Daily  . levothyroxine  100 mcg Oral QAC breakfast  . linaclotide  290 mcg Oral QAC breakfast  . mouth rinse  15 mL Mouth Rinse BID  . Melatonin  3 mg Oral QHS  . mupirocin ointment  1 application Nasal BID  . MUSCLE  RUB  1 application Topical BID  . pantoprazole  40 mg Oral Daily  . polyethylene glycol  17 g Oral BID  . predniSONE  40 mg Oral Q breakfast  . senna  1 tablet Oral BID  . sodium chloride  1 spray Each Nare TID  . traZODone  50 mg Oral QHS  . warfarin  2 mg Oral q1800  . Warfarin - Pharmacist Dosing Inpatient   Does not apply q1800   acetaminophen, guaiFENesin-dextromethorphan, HYDROcodone-acetaminophen, ondansetron (ZOFRAN) IV  Assessment/ Plan:    chronic renal failure secondary to nephrosclerosis no issues probably cardiorenal  I would avoid ACE or ARBs, she appears stable and would restart home dose of torsemide 40mg  daily  Acute on chronic renal failure that is stable and I am good with the creatinine    Congestive heart failure EF 25 %  Restart torsemide   Mild aortic regurgitation s/p TAVR  Paroxysmal Atrial Fibrillation coumadin  Lumbar spondylosis   Will sign off  Thank you for consult   LOS: 5 [name] @TODAY @11 :41 AM

## 2017-12-04 ENCOUNTER — Emergency Department (HOSPITAL_COMMUNITY): Payer: Medicare Other

## 2017-12-04 ENCOUNTER — Encounter (HOSPITAL_COMMUNITY): Payer: Self-pay

## 2017-12-04 ENCOUNTER — Inpatient Hospital Stay (HOSPITAL_COMMUNITY)
Admission: EM | Admit: 2017-12-04 | Discharge: 2017-12-08 | DRG: 291 | Disposition: A | Discharge status: Skilled Nursing Facility | Payer: Medicare Other | Attending: Internal Medicine | Admitting: Internal Medicine

## 2017-12-04 ENCOUNTER — Other Ambulatory Visit: Payer: Self-pay

## 2017-12-04 DIAGNOSIS — Z96651 Presence of right artificial knee joint: Secondary | ICD-10-CM

## 2017-12-04 DIAGNOSIS — G4733 Obstructive sleep apnea (adult) (pediatric): Secondary | ICD-10-CM | POA: Diagnosis present

## 2017-12-04 DIAGNOSIS — M797 Fibromyalgia: Secondary | ICD-10-CM | POA: Diagnosis present

## 2017-12-04 DIAGNOSIS — G43809 Other migraine, not intractable, without status migrainosus: Secondary | ICD-10-CM | POA: Diagnosis not present

## 2017-12-04 DIAGNOSIS — R7989 Other specified abnormal findings of blood chemistry: Secondary | ICD-10-CM

## 2017-12-04 DIAGNOSIS — I10 Essential (primary) hypertension: Secondary | ICD-10-CM | POA: Diagnosis present

## 2017-12-04 DIAGNOSIS — Z950 Presence of cardiac pacemaker: Secondary | ICD-10-CM | POA: Diagnosis not present

## 2017-12-04 DIAGNOSIS — J9621 Acute and chronic respiratory failure with hypoxia: Secondary | ICD-10-CM | POA: Diagnosis present

## 2017-12-04 DIAGNOSIS — G629 Polyneuropathy, unspecified: Secondary | ICD-10-CM | POA: Diagnosis present

## 2017-12-04 DIAGNOSIS — I509 Heart failure, unspecified: Secondary | ICD-10-CM

## 2017-12-04 DIAGNOSIS — E785 Hyperlipidemia, unspecified: Secondary | ICD-10-CM | POA: Diagnosis present

## 2017-12-04 DIAGNOSIS — I959 Hypotension, unspecified: Secondary | ICD-10-CM | POA: Diagnosis present

## 2017-12-04 DIAGNOSIS — Z9981 Dependence on supplemental oxygen: Secondary | ICD-10-CM | POA: Diagnosis not present

## 2017-12-04 DIAGNOSIS — I350 Nonrheumatic aortic (valve) stenosis: Secondary | ICD-10-CM

## 2017-12-04 DIAGNOSIS — I5043 Acute on chronic combined systolic (congestive) and diastolic (congestive) heart failure: Secondary | ICD-10-CM | POA: Diagnosis present

## 2017-12-04 DIAGNOSIS — R778 Other specified abnormalities of plasma proteins: Secondary | ICD-10-CM

## 2017-12-04 DIAGNOSIS — Z79899 Other long term (current) drug therapy: Secondary | ICD-10-CM

## 2017-12-04 DIAGNOSIS — Z885 Allergy status to narcotic agent status: Secondary | ICD-10-CM | POA: Diagnosis not present

## 2017-12-04 DIAGNOSIS — Z7901 Long term (current) use of anticoagulants: Secondary | ICD-10-CM

## 2017-12-04 DIAGNOSIS — I130 Hypertensive heart and chronic kidney disease with heart failure and stage 1 through stage 4 chronic kidney disease, or unspecified chronic kidney disease: Secondary | ICD-10-CM | POA: Diagnosis present

## 2017-12-04 DIAGNOSIS — Z888 Allergy status to other drugs, medicaments and biological substances status: Secondary | ICD-10-CM | POA: Diagnosis not present

## 2017-12-04 DIAGNOSIS — J9690 Respiratory failure, unspecified, unspecified whether with hypoxia or hypercapnia: Secondary | ICD-10-CM | POA: Diagnosis present

## 2017-12-04 DIAGNOSIS — J189 Pneumonia, unspecified organism: Secondary | ICD-10-CM | POA: Diagnosis present

## 2017-12-04 DIAGNOSIS — Z86711 Personal history of pulmonary embolism: Secondary | ICD-10-CM

## 2017-12-04 DIAGNOSIS — N179 Acute kidney failure, unspecified: Secondary | ICD-10-CM | POA: Diagnosis present

## 2017-12-04 DIAGNOSIS — E039 Hypothyroidism, unspecified: Secondary | ICD-10-CM | POA: Diagnosis present

## 2017-12-04 DIAGNOSIS — J45909 Unspecified asthma, uncomplicated: Secondary | ICD-10-CM | POA: Diagnosis present

## 2017-12-04 DIAGNOSIS — I248 Other forms of acute ischemic heart disease: Secondary | ICD-10-CM | POA: Diagnosis present

## 2017-12-04 DIAGNOSIS — Z8673 Personal history of transient ischemic attack (TIA), and cerebral infarction without residual deficits: Secondary | ICD-10-CM

## 2017-12-04 DIAGNOSIS — N184 Chronic kidney disease, stage 4 (severe): Secondary | ICD-10-CM | POA: Diagnosis present

## 2017-12-04 DIAGNOSIS — R0602 Shortness of breath: Secondary | ICD-10-CM | POA: Diagnosis not present

## 2017-12-04 DIAGNOSIS — I428 Other cardiomyopathies: Secondary | ICD-10-CM | POA: Diagnosis present

## 2017-12-04 DIAGNOSIS — R791 Abnormal coagulation profile: Secondary | ICD-10-CM

## 2017-12-04 DIAGNOSIS — Z952 Presence of prosthetic heart valve: Secondary | ICD-10-CM | POA: Diagnosis not present

## 2017-12-04 DIAGNOSIS — M792 Neuralgia and neuritis, unspecified: Secondary | ICD-10-CM | POA: Diagnosis present

## 2017-12-04 DIAGNOSIS — J9600 Acute respiratory failure, unspecified whether with hypoxia or hypercapnia: Secondary | ICD-10-CM

## 2017-12-04 DIAGNOSIS — I480 Paroxysmal atrial fibrillation: Secondary | ICD-10-CM | POA: Diagnosis present

## 2017-12-04 DIAGNOSIS — I495 Sick sinus syndrome: Secondary | ICD-10-CM | POA: Diagnosis present

## 2017-12-04 DIAGNOSIS — G43909 Migraine, unspecified, not intractable, without status migrainosus: Secondary | ICD-10-CM | POA: Diagnosis not present

## 2017-12-04 DIAGNOSIS — I2510 Atherosclerotic heart disease of native coronary artery without angina pectoris: Secondary | ICD-10-CM | POA: Diagnosis present

## 2017-12-04 DIAGNOSIS — F0390 Unspecified dementia without behavioral disturbance: Secondary | ICD-10-CM | POA: Diagnosis present

## 2017-12-04 DIAGNOSIS — R748 Abnormal levels of other serum enzymes: Secondary | ICD-10-CM | POA: Diagnosis present

## 2017-12-04 DIAGNOSIS — M810 Age-related osteoporosis without current pathological fracture: Secondary | ICD-10-CM | POA: Diagnosis present

## 2017-12-04 DIAGNOSIS — M109 Gout, unspecified: Secondary | ICD-10-CM | POA: Diagnosis present

## 2017-12-04 DIAGNOSIS — K219 Gastro-esophageal reflux disease without esophagitis: Secondary | ICD-10-CM | POA: Diagnosis present

## 2017-12-04 DIAGNOSIS — H9190 Unspecified hearing loss, unspecified ear: Secondary | ICD-10-CM | POA: Diagnosis present

## 2017-12-04 LAB — I-STAT VENOUS BLOOD GAS, ED
ACID-BASE EXCESS: 2 mmol/L (ref 0.0–2.0)
Bicarbonate: 23.7 mmol/L (ref 20.0–28.0)
O2 SAT: 100 %
TCO2: 25 mmol/L (ref 22–32)
pCO2, Ven: 28.8 mmHg — ABNORMAL LOW (ref 44.0–60.0)
pH, Ven: 7.524 — ABNORMAL HIGH (ref 7.250–7.430)
pO2, Ven: 159 mmHg — ABNORMAL HIGH (ref 32.0–45.0)

## 2017-12-04 LAB — BASIC METABOLIC PANEL
Anion gap: 13 (ref 5–15)
BUN: 33 mg/dL — AB (ref 8–23)
CHLORIDE: 103 mmol/L (ref 98–111)
CO2: 23 mmol/L (ref 22–32)
CREATININE: 2.08 mg/dL — AB (ref 0.44–1.00)
Calcium: 9.4 mg/dL (ref 8.9–10.3)
GFR calc Af Amer: 24 mL/min — ABNORMAL LOW (ref >60)
GFR calc non Af Amer: 21 mL/min — ABNORMAL LOW (ref >60)
GLUCOSE: 107 mg/dL — AB (ref 70–99)
POTASSIUM: 4.1 mmol/L (ref 3.5–5.1)
Sodium: 139 mmol/L (ref 135–145)

## 2017-12-04 LAB — CBC WITH DIFFERENTIAL/PLATELET
Abs Immature Granulocytes: 0.1 10*3/uL (ref 0.0–0.1)
Basophils Absolute: 0 10*3/uL (ref 0.0–0.1)
Basophils Relative: 0 %
EOS ABS: 0 10*3/uL (ref 0.0–0.7)
EOS PCT: 0 %
HEMATOCRIT: 37.2 % (ref 36.0–46.0)
HEMOGLOBIN: 11.6 g/dL — AB (ref 12.0–15.0)
Immature Granulocytes: 1 %
LYMPHS ABS: 0.6 10*3/uL — AB (ref 0.7–4.0)
Lymphocytes Relative: 4 %
MCH: 32 pg (ref 26.0–34.0)
MCHC: 31.2 g/dL (ref 30.0–36.0)
MCV: 102.5 fL — AB (ref 78.0–100.0)
MONO ABS: 1 10*3/uL (ref 0.1–1.0)
Monocytes Relative: 7 %
Neutro Abs: 12.7 10*3/uL — ABNORMAL HIGH (ref 1.7–7.7)
Neutrophils Relative %: 88 %
Platelets: 177 10*3/uL (ref 150–400)
RBC: 3.63 MIL/uL — ABNORMAL LOW (ref 3.87–5.11)
RDW: 14.6 % (ref 11.5–15.5)
WBC: 14.4 10*3/uL — ABNORMAL HIGH (ref 4.0–10.5)

## 2017-12-04 LAB — TROPONIN I
Troponin I: 0.18 ng/mL (ref <0.03)
Troponin I: 0.18 ng/mL (ref <0.03)

## 2017-12-04 LAB — I-STAT TROPONIN, ED: TROPONIN I, POC: 0.19 ng/mL — AB (ref 0.00–0.08)

## 2017-12-04 LAB — PROTIME-INR
INR: 4.65
Prothrombin Time: 43.5 seconds — ABNORMAL HIGH (ref 11.4–15.2)

## 2017-12-04 LAB — BRAIN NATRIURETIC PEPTIDE: B Natriuretic Peptide: 1135.5 pg/mL — ABNORMAL HIGH (ref 0.0–100.0)

## 2017-12-04 LAB — MRSA PCR SCREENING: MRSA by PCR: POSITIVE — AB

## 2017-12-04 LAB — PROCALCITONIN: Procalcitonin: 3.52 ng/mL

## 2017-12-04 MED ORDER — DONEPEZIL HCL 5 MG PO TABS
10.0000 mg | ORAL_TABLET | Freq: Every day | ORAL | Status: DC
Start: 2017-12-04 — End: 2017-12-08
  Administered 2017-12-05 – 2017-12-07 (×3): 10 mg via ORAL
  Filled 2017-12-04 (×3): qty 2

## 2017-12-04 MED ORDER — AMIODARONE HCL 200 MG PO TABS
100.0000 mg | ORAL_TABLET | Freq: Every day | ORAL | Status: DC
  Administered 2017-12-05 – 2017-12-08 (×4): 100 mg via ORAL
  Filled 2017-12-04 (×4): qty 1

## 2017-12-04 MED ORDER — LEVOTHYROXINE SODIUM 100 MCG PO TABS
100.0000 ug | ORAL_TABLET | Freq: Every day | ORAL | Status: DC
  Administered 2017-12-05 – 2017-12-08 (×4): 100 ug via ORAL
  Filled 2017-12-04 (×4): qty 1

## 2017-12-04 MED ORDER — NITROGLYCERIN IN D5W 200-5 MCG/ML-% IV SOLN: 0.0000 ug/min | Freq: Once | INTRAVENOUS | Status: DC

## 2017-12-04 MED ORDER — BENZONATATE 100 MG PO CAPS
100.0000 mg | ORAL_CAPSULE | Freq: Two times a day (BID) | ORAL | Status: DC | PRN
  Administered 2017-12-08: 100 mg via ORAL
  Filled 2017-12-04: qty 1

## 2017-12-04 MED ORDER — WARFARIN - PHARMACIST DOSING INPATIENT: Freq: Every day | Status: DC

## 2017-12-04 MED ORDER — LINACLOTIDE 145 MCG PO CAPS
290.0000 ug | ORAL_CAPSULE | Freq: Every day | ORAL | Status: DC
  Administered 2017-12-05 – 2017-12-08 (×4): 290 ug via ORAL
  Filled 2017-12-04 (×4): qty 2

## 2017-12-04 MED ORDER — NITROGLYCERIN 2 % TD OINT
1.0000 [in_us] | TOPICAL_OINTMENT | Freq: Four times a day (QID) | TRANSDERMAL | Status: DC
  Administered 2017-12-04: 1 [in_us] via TOPICAL
  Filled 2017-12-04: qty 1

## 2017-12-04 MED ORDER — IPRATROPIUM-ALBUTEROL 0.5-2.5 (3) MG/3ML IN SOLN: 3.0000 mL | Freq: Four times a day (QID) | RESPIRATORY_TRACT | Status: DC | PRN

## 2017-12-04 MED ORDER — ATORVASTATIN CALCIUM 10 MG PO TABS
10.0000 mg | ORAL_TABLET | Freq: Every day | ORAL | Status: DC
  Administered 2017-12-05 – 2017-12-07 (×3): 10 mg via ORAL
  Filled 2017-12-04 (×4): qty 1

## 2017-12-04 MED ORDER — ONDANSETRON HCL 4 MG PO TABS
4.0000 mg | ORAL_TABLET | Freq: Four times a day (QID) | ORAL | Status: DC | PRN
  Administered 2017-12-08: 4 mg via ORAL
  Filled 2017-12-04: qty 1

## 2017-12-04 MED ORDER — CALCITRIOL 0.25 MCG PO CAPS
0.2500 ug | ORAL_CAPSULE | Freq: Every day | ORAL | Status: DC
  Administered 2017-12-05 – 2017-12-08 (×4): 0.25 ug via ORAL
  Filled 2017-12-04 (×4): qty 1

## 2017-12-04 MED ORDER — ALLOPURINOL 100 MG PO TABS
100.0000 mg | ORAL_TABLET | Freq: Every day | ORAL | Status: DC
  Administered 2017-12-05 – 2017-12-08 (×4): 100 mg via ORAL
  Filled 2017-12-04 (×4): qty 1

## 2017-12-04 MED ORDER — SENNA 8.6 MG PO TABS
1.0000 | ORAL_TABLET | Freq: Two times a day (BID) | ORAL | Status: DC
  Administered 2017-12-05 – 2017-12-08 (×5): 8.6 mg via ORAL
  Filled 2017-12-04 (×6): qty 1

## 2017-12-04 MED ORDER — HYDROCODONE-ACETAMINOPHEN 5-325 MG PO TABS
1.0000 | ORAL_TABLET | ORAL | Status: DC | PRN
  Administered 2017-12-05 – 2017-12-08 (×5): 1 via ORAL
  Filled 2017-12-04 (×5): qty 1

## 2017-12-04 MED ORDER — SODIUM CHLORIDE 0.9 % IV SOLN: 250.0000 mL | INTRAVENOUS | Status: DC | PRN

## 2017-12-04 MED ORDER — FUROSEMIDE 10 MG/ML IJ SOLN
40.0000 mg | Freq: Once | INTRAMUSCULAR | Status: AC
  Administered 2017-12-04: 40 mg via INTRAVENOUS
  Filled 2017-12-04: qty 4

## 2017-12-04 MED ORDER — DOCUSATE SODIUM 100 MG PO CAPS
100.0000 mg | ORAL_CAPSULE | Freq: Two times a day (BID) | ORAL | Status: DC
  Administered 2017-12-05 – 2017-12-08 (×7): 100 mg via ORAL
  Filled 2017-12-04 (×7): qty 1

## 2017-12-04 MED ORDER — MENTHOL (TOPICAL ANALGESIC) 4 % EX GEL: 1.0000 "application " | Freq: Two times a day (BID) | CUTANEOUS | Status: DC

## 2017-12-04 MED ORDER — VITAMIN D 1000 UNITS PO TABS
2000.0000 [IU] | ORAL_TABLET | Freq: Every day | ORAL | Status: DC
  Administered 2017-12-05 – 2017-12-07 (×3): 2000 [IU] via ORAL
  Filled 2017-12-04 (×3): qty 2

## 2017-12-04 MED ORDER — ISOSORBIDE MONONITRATE ER 30 MG PO TB24
30.0000 mg | ORAL_TABLET | Freq: Every day | ORAL | Status: DC
  Administered 2017-12-05 – 2017-12-08 (×4): 30 mg via ORAL
  Filled 2017-12-04 (×4): qty 1

## 2017-12-04 MED ORDER — RENA-VITE PO TABS
1.0000 | ORAL_TABLET | Freq: Every day | ORAL | Status: DC
  Administered 2017-12-05 – 2017-12-08 (×4): 1 via ORAL
  Filled 2017-12-04 (×4): qty 1

## 2017-12-04 MED ORDER — CHLORHEXIDINE GLUCONATE CLOTH 2 % EX PADS
6.0000 | MEDICATED_PAD | Freq: Every day | CUTANEOUS | Status: DC
  Administered 2017-12-05 – 2017-12-08 (×4): 6 via TOPICAL

## 2017-12-04 MED ORDER — SODIUM CHLORIDE 0.9% FLUSH: 3.0000 mL | INTRAVENOUS | Status: DC | PRN

## 2017-12-04 MED ORDER — NITROGLYCERIN 0.4 MG SL SUBL: 0.4000 mg | SUBLINGUAL_TABLET | SUBLINGUAL | Status: DC | PRN

## 2017-12-04 MED ORDER — DULOXETINE HCL 60 MG PO CPEP
60.0000 mg | ORAL_CAPSULE | Freq: Every day | ORAL | Status: DC
  Administered 2017-12-05 – 2017-12-07 (×3): 60 mg via ORAL
  Filled 2017-12-04 (×4): qty 1

## 2017-12-04 MED ORDER — SODIUM CHLORIDE 0.9% FLUSH
3.0000 mL | Freq: Two times a day (BID) | INTRAVENOUS | Status: DC
  Administered 2017-12-04 – 2017-12-08 (×7): 3 mL via INTRAVENOUS

## 2017-12-04 MED ORDER — MUPIROCIN 2 % EX OINT
1.0000 "application " | TOPICAL_OINTMENT | Freq: Two times a day (BID) | CUTANEOUS | Status: DC
  Administered 2017-12-05 – 2017-12-08 (×7): 1 via NASAL
  Filled 2017-12-04: qty 22

## 2017-12-04 MED ORDER — FERROUS SULFATE 325 (65 FE) MG PO TABS
325.0000 mg | ORAL_TABLET | Freq: Every day | ORAL | Status: DC
Start: 2017-12-05 — End: 2017-12-08
  Administered 2017-12-05 – 2017-12-08 (×3): 325 mg via ORAL
  Filled 2017-12-04 (×4): qty 1

## 2017-12-04 MED ORDER — WARFARIN SODIUM 4 MG PO TABS: 4.0000 mg | ORAL_TABLET | Freq: Every day | ORAL | Status: DC

## 2017-12-04 MED ORDER — ACETAMINOPHEN 325 MG PO TABS
650.0000 mg | ORAL_TABLET | Freq: Four times a day (QID) | ORAL | Status: DC | PRN
  Administered 2017-12-06: 650 mg via ORAL
  Filled 2017-12-04: qty 2

## 2017-12-04 MED ORDER — GABAPENTIN 100 MG PO CAPS
200.0000 mg | ORAL_CAPSULE | Freq: Two times a day (BID) | ORAL | Status: DC
  Administered 2017-12-05 – 2017-12-08 (×7): 200 mg via ORAL
  Filled 2017-12-04 (×8): qty 2

## 2017-12-04 MED ORDER — FUROSEMIDE 10 MG/ML IJ SOLN
80.0000 mg | Freq: Two times a day (BID) | INTRAMUSCULAR | Status: DC
  Administered 2017-12-04: 80 mg via INTRAVENOUS
  Filled 2017-12-04: qty 8

## 2017-12-04 MED ORDER — CYCLOSPORINE 0.05 % OP EMUL
1.0000 [drp] | Freq: Two times a day (BID) | OPHTHALMIC | Status: DC
  Administered 2017-12-04 – 2017-12-08 (×8): 1 [drp] via OPHTHALMIC
  Filled 2017-12-04 (×8): qty 1

## 2017-12-04 MED ORDER — MUSCLE RUB 10-15 % EX CREA
TOPICAL_CREAM | Freq: Two times a day (BID) | CUTANEOUS | Status: DC
  Administered 2017-12-05 – 2017-12-07 (×4): via TOPICAL
  Administered 2017-12-07: 1 via TOPICAL
  Administered 2017-12-08: 09:00:00 via TOPICAL
  Filled 2017-12-04: qty 85

## 2017-12-04 MED ORDER — ONDANSETRON HCL 4 MG/2ML IJ SOLN
4.0000 mg | Freq: Four times a day (QID) | INTRAMUSCULAR | Status: DC | PRN
  Administered 2017-12-06 – 2017-12-07 (×3): 4 mg via INTRAVENOUS
  Filled 2017-12-04 (×3): qty 2

## 2017-12-04 MED ORDER — LIDOCAINE 5 % EX PTCH
1.0000 | MEDICATED_PATCH | CUTANEOUS | Status: DC
  Administered 2017-12-04 – 2017-12-08 (×5): 1 via TRANSDERMAL
  Filled 2017-12-04 (×5): qty 1

## 2017-12-04 MED ORDER — POLYETHYLENE GLYCOL 3350 17 G PO PACK
17.0000 g | PACK | Freq: Every day | ORAL | Status: DC
  Administered 2017-12-07 – 2017-12-08 (×2): 17 g via ORAL
  Filled 2017-12-04 (×3): qty 1

## 2017-12-04 MED ORDER — ACETAMINOPHEN 650 MG RE SUPP: 650.0000 mg | Freq: Four times a day (QID) | RECTAL | Status: DC | PRN

## 2017-12-04 NOTE — H&P (Signed)
History and Physical    [name] TMA:[id number] DOB: Feb 18, 1933 DOA: 12/04/2017  PCP: Pearson Grippe, MD  Patient coming from: SNF  Chief Complaint: SOB  HPI: [name] is a 84 y.o. female with medical history significant of stage IV CKD, hypothyroidism, depression/anxiety, neuropathic pain, hyperlipidemia, gout, chronic systolic and diastolic heart failure (echo on 11/08/2017 with EF of 25 to 30%, wall motion abnormalities, dementia, grade 2 diastolic dysfunction), severe aortic stenosis status post TAVR, symptomatic bradycardia status post permanent pacemaker, CVA, obstructive sleep apnea, paroxysmal atrial fibrillation on warfarin, chronic hypoxic respiratory failure on 2 L nasal cannula who presents to the ED from nursing home with acute hypoxic respiratory failure.  Much of patient's history is limited by patient's  medical condition.  In brief patient was noted to be increasingly short of breath at nursing home and was noted to have a systolic blood pressure greater than 200.  Patient reported stable several pillow orthopnea however awoke with chest pain that radiated down her left arm.  She described the pain as pressure.  She felt pain like this before when she had been admitted approximately 1 month ago for CHF exacerbation.  She denied any radiation to her back or her jaw.  She denies any syncope.  She denied any worsening lower extremity edema.  She had baseline chronic dyspnea on exertion and orthopnea.  She apparently was given a blood pressure medication though it is unclear exactly which one or the route.    ED Course: In the ED patient's vitals were notable for mild hypotension and tachypnea.  Patient was placed on BiPAP.  Labs are notable for an elevated white count of 14.4.  Creatinine was 2.08 which is around baseline.  INR was 4.65.  Troponin was elevated at 0.19.  It was apparently quite difficult to get IV access and so after some time patient was given 40 mg of IV  furosemide.  BNP was noted to be elevated.  Review of Systems: As per HPI otherwise 10 point review of systems negative.    Past Medical History:  Diagnosis Date  . Acute on chronic renal failure [facility])    sees Dr Allena Katz   . Anemia    Acute blood loss  . Aortic regurgitation   . Aortic stenosis 10/13/2012   Low EF, low gradient with severe aortic stenosis confirmed by dobutamine stress echocardiogram s/p TAVR 12/2012  . Asthma   . Atrial fibrillation (HCC)    tachy-brady syndrome with <1% recurrent PAF since pacemaker placement  . Cataracts, bilateral   . Chronic combined systolic and diastolic CHF (congestive heart failure) (HCC)   . Chronic lower back pain   . CKD (chronic kidney disease)   . Coronary artery disease involving native coronary artery of native heart   . Dementia    Without behavioral disturbance  . Dysrhythmia   . Fibromyalgia   . Gastroesophageal reflux disease   . H/O dizziness   . H/O urinary frequency   . H/O: stroke   . Hard of hearing   . Headache    hx of migraines   . Heart murmur   . History of blood transfusion   . History of bronchitis   . History of kidney stones   . History of urinary tract infection   . HLD (hyperlipidemia)   . HTN (hypertension)   . Hypothyroidism   . Neuropathy   . Nonischemic cardiomyopathy (HCC)   . On home oxygen therapy    patient  uses at nite- 2L- has not used in > 6 months per patient  . Osteoarthritis   . Osteoporosis   . Pneumonia    hx of x 3   . PONV (postoperative nausea and vomiting)   . Presence of permanent cardiac pacemaker   . Pulmonary embolism (HCC)    HISTORY OF, the pt. had a recurrent bilateral pulmonary emboli in 2005, on warfarin therapy and at which time she under went implantation of IVC filter  . Repeated falls   . Rupture of right patellar tendon   . Sciatica   . Shortness of breath dyspnea    with exertion   . Sleep apnea    uses oxygen at night and PRN- not used since > 6 months /  DOES NOT USE  C-PAP  . Spinal stenosis   . Stroke (HCC)   . Symptomatic bradycardia   . Symptomatic bradycardia 2012   s/p Medtronic PPM  . Syncope   . Urinary incontinence   . Urinary urgency     Past Surgical History:  Procedure Laterality Date  . ABDOMINAL HYSTERECTOMY    . APPENDECTOMY    . BACK SURGERY    . BUNIONECTOMY    . CARDIAC CATHETERIZATION    . CATARACT EXTRACTION    . CENTRAL VENOUS CATHETER INSERTION Left 12/21/2012   Procedure: INSERTION CENTRAL LINE ADULT;  Surgeon: Tonny Bollman, MD;  Location: [facility] OR;  Service: Open Heart Surgery;  Laterality: Left;  . ELBOW SURGERY     bilat   . INTRAOPERATIVE TRANSESOPHAGEAL ECHOCARDIOGRAM N/A 12/21/2012   Procedure: INTRAOPERATIVE TRANSESOPHAGEAL ECHOCARDIOGRAM;  Surgeon: Tonny Bollman, MD;  Location[facility] OR;  Service: Open Heart Surgery;  Laterality: N/A;  . KNEE SURGERY Left   . LEFT AND RIGHT HEART CATHETERIZATION WITH CORONARY ANGIOGRAM N/A 10/18/2012   Procedure: LEFT AND RIGHT HEART CATHETERIZATION WITH CORONARY ANGIOGRAM;  Surgeon: Tonny Bollman, MD;  Location: [facility] CATH LAB;  Service: Cardiovascular;  Laterality: N/A;  . NASAL SEPTUM SURGERY    . NOSE SURGERY     X 2  . ORIF PATELLA Right 03/08/2015   Procedure:  OPEN REDUCTION INTERNAL FIXATION RIGHT  PATELLA TENDON AVULSION;  Surgeon: Durene Romans, MD;  Location: [facility] ORS;  Service: Orthopedics;  Laterality: Right;  . PACEMAKER INSERTION     Medtronic  . PATELLAR TENDON REPAIR Right 06/25/2015   Procedure: RIGHT PATELLA TENDON REVISION/REPAIR;  Surgeon: Durene Romans, MD;  Location: [facility] ORS;  Service: Orthopedics;  Laterality: Right;  . SHOULDER SURGERY     bilat   . THYROIDECTOMY, PARTIAL    . TONSILLECTOMY    . TOTAL KNEE ARTHROPLASTY Right 12/04/2014   Procedure: RIGHT TOTAL  KNEE ARTHROPLASTY;  Surgeon: Durene Romans, MD;  Location: [facility] ORS;  Service: Orthopedics;  Laterality: Right;  . TRANSCATHETER AORTIC VALVE REPLACEMENT, TRANSFEMORAL  12/21/2012   a. 29mm Edwards  Sapien XT transcatheter heart valve placed via open left transfemoral approach b. Intra-op TEE: well-seated bioprosthetic aortic valve with mean gradient 2 mmHg, trivial AI, mild MR, EF 30-35%  . TRANSCATHETER AORTIC VALVE REPLACEMENT, TRANSFEMORAL N/A 12/21/2012   Procedure: TRANSCATHETER AORTIC VALVE REPLACEMENT, TRANSFEMORAL;  Surgeon: Tonny Bollman, MD;  Location: [facility] OR;  Service: Open Heart Surgery;  Laterality: N/A;     reports that she has never smoked. She has never used smokeless tobacco. She reports that she does not drink alcohol or use drugs.  Allergies  Allergen Reactions  . Nubain [Nalbuphine Hcl] Hives    Went into cardiac arrest   .  Codeine Hives and Nausea Only    Has tolerated Oxycodone  . Darvocet [Propoxyphene N-Acetaminophen] Nausea Only    Family History  Problem Relation Age of Onset  . Ovarian cancer Mother        Deceased  . Epilepsy Father        Deceased     Prior to Admission medications   Medication Sig Start Date End Date Taking? Authorizing Provider  allopurinol (ZYLOPRIM) 100 MG tablet Take 100 mg by mouth daily.   Yes [provider]  amiodarone (PACERONE) 100 MG tablet Take 100 mg by mouth daily.   Yes [provider]  atorvastatin (LIPITOR) 10 MG tablet Take 10 mg by mouth daily at 6 PM.    Yes [provider]  benzonatate (TESSALON) 100 MG capsule Take 100 mg by mouth 2 (two) times daily as needed for cough.   Yes [provider]  calcitRIOL (ROCALTROL) 0.25 MCG capsule Take 0.25 mcg by mouth daily.    Yes [provider]  cholecalciferol (VITAMIN D) 1000 units tablet Take 2,000 Units by mouth at bedtime.   Yes [provider]  cycloSPORINE (RESTASIS) 0.05 % ophthalmic emulsion Place 1 drop into both eyes 2 (two) times daily.    Yes [provider]  donepezil (ARICEPT) 10 MG tablet Take 10 mg by mouth at bedtime.    Yes [provider]  DULoxetine (CYMBALTA) 60 MG capsule Take  60 mg by mouth daily at 6 PM.    Yes [provider]  ferrous sulfate 325 (65 FE) MG tablet Take 325 mg by mouth daily.    Yes [provider]  gabapentin (NEURONTIN) 100 MG capsule Take 200 mg by mouth 2 (two) times daily.   Yes [provider]  ipratropium-albuterol (DUONEB) 0.5-2.5 (3) MG/3ML SOLN Take 3 mLs by nebulization every 6 (six) hours as needed (wheezing/shortness of breath).   Yes [provider]  isosorbide mononitrate (IMDUR) 30 MG 24 hr tablet Take 1 tablet (30 mg total) by mouth daily. 08/05/16 12/04/17 Yes Bhagat, Bhavinkumar, PA  levothyroxine (SYNTHROID, LEVOTHROID) 100 MCG tablet Take 1 tablet (100 mcg total) by mouth daily before breakfast. 11/12/17  Yes Berton Mount I, MD  lidocaine (LIDODERM) 5 % Place 1 patch onto the skin daily. Apply to lower back as needed for pain.   Yes [provider]  linaclotide (LINZESS) 290 MCG CAPS capsule Take 290 mcg by mouth daily.    Yes [provider]  Menthol, Topical Analgesic, (BIOFREEZE) 4 % GEL Apply 1 application topically 2 (two) times daily.   Yes [provider]  mineral oil enema Place 1 enema rectally once. Administer if KUB is positive.   Yes [provider]  multivitamin (RENA-VIT) TABS tablet Take 1 tablet by mouth daily.    Yes [provider]  mupirocin ointment (BACTROBAN) 2 % Place 1 application into the nose 2 (two) times daily. 11/12/17  Yes Barnetta Chapel, MD  nitroGLYCERIN (NITROSTAT) 0.4 MG SL tablet Place 0.4 mg under the tongue every 5 (five) minutes as needed for chest pain (MAX 3 TABLETS).    Yes [provider]  polyethylene glycol (MIRALAX / GLYCOLAX) packet Take 17 g by mouth daily.   Yes [provider]  torsemide (DEMADEX) 20 MG tablet Take 40 mg by mouth daily.    Yes [provider]  warfarin (COUMADIN) 4 MG tablet Take 4 mg by mouth daily.    Yes [provider]  HYDROcodone-acetaminophen (NORCO/VICODIN) 5-325 MG tablet Take 1 tablet by mouth every 4 (four) hours as needed for severe pain. 11/12/17   Barnetta Chapel, MD    Physical Exam: Vitals:   12/04/17 1245 12/04/17 1300 12/04/17 1313 12/04/17 1315  BP: 100/62 (!) 88/45 (!) 90/46 (!) 93/46  Pulse: 87 76 77 76  Resp: (!) 24 16 17 19   Temp:      TempSrc:      SpO2: 96% 94% 94% 94%    Constitutional: NAD, calm, comfortable Vitals:   12/04/17 1245 12/04/17 1300 12/04/17 1313 12/04/17 1315  BP: 100/62 (!) 88/45 (!) 90/46 (!) 93/46  Pulse: 87 76 77 76  Resp: (!) 24 16 17 19   Temp:      TempSrc:      SpO2: 96% 94% 94% 94%   Eyes: Anicteric sclera ENMT: Mucous membranes are moist.  BiPAP in place Neck: Pickwickian neck Respiratory: Moderately increased work of breathing, tachypnea, diminished lung sounds at bases, unable to appreciate any wheezes, rhonchi, rales over BiPAP.  Cardiovascular: Distant heart sounds, irregularly irregular, no murmurs.  Abdomen: no tenderness, no masses palpated. No hepatosplenomegaly. Bowel sounds positive.  Musculoskeletal: 1+ lower extremity edema Skin: no rashes over visible skin Neurologic: Grossly intact, moving all extremities.  Psychiatric: Normal judgment and insight. Alert and oriented x 3. Normal mood.     Labs on Admission: I have personally reviewed following labs and imaging studies  CBC: Recent Labs  Lab 12/04/17 1037  WBC 14.4*  NEUTROABS 12.7*  HGB 11.6*  HCT 37.2  MCV 102.5*  PLT 177   Basic Metabolic Panel: Recent Labs  Lab 12/04/17 1037  NA 139  K 4.1  CL 103  CO2 23  GLUCOSE 107*  BUN 33*  CREATININE 2.08*  CALCIUM 9.4   GFR: CrCl cannot be calculated (Unknown ideal weight.). Liver Function Tests: No results for input(s): AST, ALT, ALKPHOS, BILITOT, PROT, ALBUMIN in the last 168 hours. No results for input(s): LIPASE, AMYLASE in the last 168 hours. No results for input(s): AMMONIA in the last 168  hours. Coagulation Profile: Recent Labs  Lab 12/04/17 1037  INR 4.65*   Cardiac Enzymes: No results for input(s): CKTOTAL, CKMB, CKMBINDEX, TROPONINI in the last 168 hours. BNP (last 3 results) No results for input(s): PROBNP in the last 8760 hours. HbA1C: No results for input(s): HGBA1C in the last 72 hours. CBG: No results for input(s): GLUCAP in the last 168 hours. Lipid Profile: No results for input(s): CHOL, HDL, LDLCALC, TRIG, CHOLHDL, LDLDIRECT in the last 72 hours. Thyroid Function Tests: No results for input(s): TSH, T4TOTAL, FREET4, T3FREE, THYROIDAB in the last 72 hours. Anemia Panel: No results for input(s): VITAMINB12, FOLATE, FERRITIN, TIBC, IRON, RETICCTPCT in the last 72 hours. Urine analysis:    Component Value Date/Time   COLORURINE YELLOW 07/15/2016 0914   APPEARANCEUR CLEAR 07/15/2016 0914   LABSPEC 1.009 07/15/2016 0914   PHURINE 5.0 07/15/2016 0914   GLUCOSEU NEGATIVE 07/15/2016 0914   HGBUR NEGATIVE 07/15/2016 0914   BILIRUBINUR NEGATIVE 07/15/2016 0914   KETONESUR NEGATIVE 07/15/2016 0914   PROTEINUR NEGATIVE 07/15/2016 0914   UROBILINOGEN 0.2 11/29/2014 1411   NITRITE NEGATIVE 07/15/2016 0914   LEUKOCYTESUR NEGATIVE 07/15/2016 0914    Radiological Exams on Admission: Dg Chest Portable 1 View  Result Date: 12/04/2017 CLINICAL DATA:  Respiratory distress. EXAM: PORTABLE CHEST 1 VIEW COMPARISON:  Chest x-ray 11/07/2017. FINDINGS: Cardiac pacer with lead tip over the right atrium right ventricle. Prior cardiac valve replacement. Cardiomegaly.  Diffuse bilateral pulmonary interstitial prominence and small bilateral pleural effusions. Findings consistent with CHF. IMPRESSION: 1. Cardiac pacer with lead tips over the right atrium and right ventricle. Prior cardiac valve replacement. 2. Findings suggesting congestive heart failure with bilateral pulmonary interstitial edema and bilateral pleural effusions. Electronically Signed   By: Maisie Fus  Register   On:  12/04/2017 10:56    EKG: Independently reviewed. Sinus tachycardia, no acute ST segment changes, intraventricular conduction delay, prolonged QRS, unclear if paced rhythm, very large anterior forces, unchanged from prior with entire precordial V1 through V6 flipped  Assessment/Plan Active Problems:   Pacemaker   Paroxysmal atrial fibrillation (HCC)   Dyslipidemia   Hypertension   Acute on chronic combined systolic and diastolic CHF (congestive heart failure) (HCC)   Severe aortic stenosis   S/P TAVR (transcatheter aortic valve replacement)   S/P right TKA   Dementia without behavioral disturbance   Neuropathic pain   Hypothyroidism   CKD (chronic kidney disease) stage 4, GFR 15-29 ml/min (HCC)   Respiratory failure (HCC)    #) Acute hypoxic respiratory failure due to acute on chronic combined systolic and grade 2 diastolic heart failure: With elevated BNP, chest x-ray showing pleural effusions and pulmonary congestion as well as reported history of hypertension suspect the patient had exacerbation of CHF due to uncontrolled hypertension.  At this time despite an elevated white blood cell count there is no evidence of a infection present.  We will hold on echo as one was performed approximately 1 month ago. -Procalcitonin ordered -IV furosemide 80 mg twice daily -Strict ins and outs, weigh daily, sodium restricted diet -Hold home torsemide 40 mg daily -Continue isosorbide mononitrate 30 mg daily -Unclear why patient is not on beta-blocker as she can tolerate this now that she has permanent pacemaker  #) Elevated troponin with EKG changes: Patient has been complaining of chest pain, she has known elevated troponin with evidence of complete inversion of the precordial axis.  Unclear if this is related to lead placement versus true change.  At this time she is fully anticoagulated and so starting a heparin drip would not be particularly fruitful.  Additionally suspect that this is simply  demand in the EKG changes are more likely due to lead placement. -Repeat EKG -Cycle troponins -PRN sublingual nitro  #) Hypertension/hyperlipidemia: -Continue long-acting nitrate - Continue atorvastatin 10 mg daily  #) Chronic respiratory failure on 2 L nasal cannula: - Continue PRN short-acting bronchodilator/short acting muscarinic  #) Hyper thyroidism: -Continue 100 mcg levothyroxine  #) Paroxysmal atrial fibrillation/status post TAVR: - Continue warfarin, pharmacy consult -Continue amiodarone 200 mg daily  #) Dementia: -Continue donepezil 10 mg nightly  #) Stage IV CKD: -Stable  #) OSA: -On BiPAP currently, will order home CPAP when more stable  #) Pain/psych: -Continue gabapentin 200 mg twice daily -Continue duloxetine 60 mg nightly -Continue linaclotide 290 mcg daily  Fluids: Restrict Electrolytes: Monitor and supplement Nutrition: Sodium restricted diet  Prophylaxis: On warfarin  Disposition: Pending baseline respiratory status  Full code   Delaine Lame MD [facility]   If 7PM-7AM, please contact night-coverage [email or web address] Password [email or web address]  12/04/2017, 1:46 PM

## 2017-12-04 NOTE — ED Notes (Signed)
PA aware of patient Bp trends.

## 2017-12-04 NOTE — ED Notes (Signed)
Phlebotomy at bedside.

## 2017-12-04 NOTE — ED Triage Notes (Signed)
[facility]- pt coming from facility with resp distress. Woke up this morning more short of breath. Pt has hx of CHF. CPAP in place and pt tolerating well. Vitals stable with ems. HR 100, BP 134/82

## 2017-12-04 NOTE — ED Notes (Signed)
Report given to 4 E

## 2017-12-04 NOTE — ED Notes (Signed)
IV team at bedside 

## 2017-12-04 NOTE — ED Notes (Signed)
PA aware of patient INR

## 2017-12-04 NOTE — ED Notes (Signed)
RN Brook attempted IV MD attempted ultrasound without success

## 2017-12-04 NOTE — Progress Notes (Signed)
Patient with out void. Bladder scan resulted 93 ml. Will continue to monitor patient. Bellarae Lizer, Randall An RN

## 2017-12-04 NOTE — ED Notes (Signed)
PA aware patient BP decreasing. IV team order placed.

## 2017-12-04 NOTE — Progress Notes (Addendum)
Md on call made aware of patient status, and updated on bladder scan volume of 40ml with out void at this time. And made aware that nursing staff was going to bladder scan patient again.  Informed MDd that IV lasix 80mg  given. No new orders received at this time will monitor patient. Grigor Lipschutz, Randall An RN

## 2017-12-04 NOTE — Progress Notes (Signed)
RT received pt on CPAP. PER MD for RT to place pt on BIPAP for increased WOB. Mask secure. No breakdown noted on face. Pt tol well

## 2017-12-04 NOTE — ED Provider Notes (Signed)
[facility] EMERGENCY DEPARTMENT Provider Note   CSN: [id number] Arrival date & time: 12/04/17  1032     History   Chief Complaint Chief Complaint  Patient presents with  . Respiratory Distress    HPI [name] is a 84 y.o. female.  Who presents the emergency department via EMS in respiratory distress.  History is given by EMS at bedside along with review of EMR.  The patient started having some difficulty breathing last night and about 45 minutes ago went into respiratory distress.  Her nurse practitioner called and spoke with our charge nurse states that she was given Solu-Medrol and clonidine.  Her blood pressure was apparently 200 systolic at that time.  She was also given albuterol treatment.  She only has a history of CHF and does not apparently have a history of COPD.  She is on Coumadin for her history of atrial fibrillation.  Patient was placed on CPAP by EMS with initial oxygen saturations in the high 80s and breathing rapidly.  HPI  Past Medical History:  Diagnosis Date  . Acute on chronic renal failure [facility])    sees Dr Allena Katz   . Anemia    Acute blood loss  . Aortic regurgitation   . Aortic stenosis 10/13/2012   Low EF, low gradient with severe aortic stenosis confirmed by dobutamine stress echocardiogram s/p TAVR 12/2012  . Asthma   . Atrial fibrillation (HCC)    tachy-brady syndrome with <1% recurrent PAF since pacemaker placement  . Cataracts, bilateral   . Chronic combined systolic and diastolic CHF (congestive heart failure) (HCC)   . Chronic lower back pain   . CKD (chronic kidney disease)   . Coronary artery disease involving native coronary artery of native heart   . Dementia    Without behavioral disturbance  . Dysrhythmia   . Fibromyalgia   . Gastroesophageal reflux disease   . H/O dizziness   . H/O urinary frequency   . H/O: stroke   . Hard of hearing   . Headache    hx of migraines   . Heart murmur   . History of blood  transfusion   . History of bronchitis   . History of kidney stones   . History of urinary tract infection   . HLD (hyperlipidemia)   . HTN (hypertension)   . Hypothyroidism   . Neuropathy   . Nonischemic cardiomyopathy (HCC)   . On home oxygen therapy    patient uses at nite- 2L- has not used in > 6 months per patient  . Osteoarthritis   . Osteoporosis   . Pneumonia    hx of x 3   . PONV (postoperative nausea and vomiting)   . Presence of permanent cardiac pacemaker   . Pulmonary embolism (HCC)    HISTORY OF, the pt. had a recurrent bilateral pulmonary emboli in 2005, on warfarin therapy and at which time she under went implantation of IVC filter  . Repeated falls   . Rupture of right patellar tendon   . Sciatica   . Shortness of breath dyspnea    with exertion   . Sleep apnea    uses oxygen at night and PRN- not used since > 6 months / DOES NOT USE  C-PAP  . Spinal stenosis   . Stroke (HCC)   . Symptomatic bradycardia   . Symptomatic bradycardia 2012   s/p Medtronic PPM  . Syncope   . Urinary incontinence   . Urinary  urgency     Patient Active Problem List   Diagnosis Date Noted  . Elevated troponin 11/07/2017  . Acute kidney injury superimposed on chronic kidney disease (HCC) 11/07/2017  . Anticoagulation management encounter 09/29/2016  . Respiratory failure (HCC) 07/21/2016  . Acute on chronic respiratory failure with hypoxia (HCC) 07/21/2016  . Head contusion 07/15/2016  . Fall 07/14/2016  . Chest pain at rest   . Acute on chronic systolic congestive heart failure (HCC)   . Unstable angina (HCC)   . Acute respiratory failure (HCC) 06/13/2016  . Elevated blood sugar 06/13/2016  . Dementia without behavioral disturbance 01/09/2016  . Chronic constipation 01/09/2016  . Neuropathic pain 01/09/2016  . Esophageal reflux 01/09/2016  . Hypothyroidism 01/09/2016  . CKD (chronic kidney disease) stage 4, GFR 15-29 ml/min (HCC) 01/09/2016  . Right patellar tendon  rupture 06/25/2015  . Avulsion of right patellar tendon 03/08/2015  . Aspiration pneumonia (HCC) 12/14/2014  . Anemia of chronic disease 12/14/2014  . Aspiration into airway 12/14/2014  . Swallowing dysfunction 12/14/2014  . S/P right TKA 12/04/2014  . S/P knee replacement 12/04/2014  . Acute blood loss anemia 12/28/2012  . Thrombocytopenia (HCC) 12/28/2012  . Toe fracture 12/28/2012  . CKD (chronic kidney disease), stage III (HCC) 12/28/2012  . Low back pain 12/28/2012  . Wound dehiscence, surgical 12/28/2012  . Atelectasis 12/28/2012  . S/P TAVR (transcatheter aortic valve replacement) 12/21/2012  . Severe aortic stenosis 12/06/2012  . Aortic stenosis 10/13/2012  . Acute on chronic systolic and diastolic heart failure, NYHA class 3 (HCC) 10/13/2012  . Pacemaker 10/15/2010  . Paroxysmal atrial fibrillation (HCC) 10/15/2010  . Dyslipidemia 10/15/2010  . Hypertension 10/15/2010  . Chronic diastolic heart failure (HCC) 10/15/2010    Past Surgical History:  Procedure Laterality Date  . ABDOMINAL HYSTERECTOMY    . APPENDECTOMY    . BACK SURGERY    . BUNIONECTOMY    . CARDIAC CATHETERIZATION    . CATARACT EXTRACTION    . CENTRAL VENOUS CATHETER INSERTION Left 12/21/2012   Procedure: INSERTION CENTRAL LINE ADULT;  Surgeon: Tonny Bollman, MD;  Location: [facility] OR;  Service: Open Heart Surgery;  Laterality: Left;  . ELBOW SURGERY     bilat   . INTRAOPERATIVE TRANSESOPHAGEAL ECHOCARDIOGRAM N/A 12/21/2012   Procedure: INTRAOPERATIVE TRANSESOPHAGEAL ECHOCARDIOGRAM;  Surgeon: Tonny Bollman, MD;  Location: [facility] OR;  Service: Open Heart Surgery;  Laterality: N/A;  . KNEE SURGERY Left   . LEFT AND RIGHT HEART CATHETERIZATION WITH CORONARY ANGIOGRAM N/A 10/18/2012   Procedure: LEFT AND RIGHT HEART CATHETERIZATION WITH CORONARY ANGIOGRAM;  Surgeon: Tonny Bollman, MD;  Location: [facility] CATH LAB;  Service: Cardiovascular;  Laterality: N/A;  . NASAL SEPTUM SURGERY    . NOSE SURGERY     X 2  . ORIF  PATELLA Right 03/08/2015   Procedure:  OPEN REDUCTION INTERNAL FIXATION RIGHT  PATELLA TENDON AVULSION;  Surgeon: Durene Romans, MD;  Location: [facility] ORS;  Service: Orthopedics;  Laterality: Right;  . PACEMAKER INSERTION     Medtronic  . PATELLAR TENDON REPAIR Right 06/25/2015   Procedure: RIGHT PATELLA TENDON REVISION/REPAIR;  Surgeon: Durene Romans, MD;  Location: [facility] ORS;  Service: Orthopedics;  Laterality: Right;  . SHOULDER SURGERY     bilat   . THYROIDECTOMY, PARTIAL    . TONSILLECTOMY    . TOTAL KNEE ARTHROPLASTY Right 12/04/2014   Procedure: RIGHT TOTAL  KNEE ARTHROPLASTY;  Surgeon: Durene Romans, MD;  Location: [facility] ORS;  Service: Orthopedics;  Laterality: Right;  .  TRANSCATHETER AORTIC VALVE REPLACEMENT, TRANSFEMORAL  12/21/2012   a. 29mm Edwards Sapien XT transcatheter heart valve placed via open left transfemoral approach b. Intra-op TEE: well-seated bioprosthetic aortic valve with mean gradient 2 mmHg, trivial AI, mild MR, EF 30-35%  . TRANSCATHETER AORTIC VALVE REPLACEMENT, TRANSFEMORAL N/A 12/21/2012   Procedure: TRANSCATHETER AORTIC VALVE REPLACEMENT, TRANSFEMORAL;  Surgeon: Tonny Bollman, MD;  Location: [facility] OR;  Service: Open Heart Surgery;  Laterality: N/A;     OB History   None      Home Medications    Prior to Admission medications   Medication Sig Start Date End Date Taking? Authorizing Provider  allopurinol (ZYLOPRIM) 100 MG tablet Take 100 mg by mouth daily.   Yes [provider]  amiodarone (PACERONE) 100 MG tablet Take 100 mg by mouth daily.   Yes [provider]  atorvastatin (LIPITOR) 10 MG tablet Take 10 mg by mouth daily at 6 PM.    Yes [provider]  benzonatate (TESSALON) 100 MG capsule Take 100 mg by mouth 2 (two) times daily as needed for cough.    [provider]  calcitRIOL (ROCALTROL) 0.25 MCG capsule Take 0.25 mcg by mouth daily.     [provider]  cholecalciferol (VITAMIN D) 1000 units tablet Take 2,000 Units by  mouth at bedtime.    [provider]  cycloSPORINE (RESTASIS) 0.05 % ophthalmic emulsion Place 1 drop into both eyes 2 (two) times daily.     [provider]  donepezil (ARICEPT) 10 MG tablet Take 10 mg by mouth at bedtime.     [provider]  DULoxetine (CYMBALTA) 60 MG capsule Take 60 mg by mouth daily at 6 PM.     [provider]  ferrous sulfate 325 (65 FE) MG tablet Take 325 mg by mouth daily.     [provider]  gabapentin (NEURONTIN) 100 MG capsule Take 200 mg by mouth 2 (two) times daily.    [provider]  HYDROcodone-acetaminophen (NORCO/VICODIN) 5-325 MG tablet Take 1 tablet by mouth every 4 (four) hours as needed for severe pain. 11/12/17   Berton Mount I, MD  ipratropium-albuterol (DUONEB) 0.5-2.5 (3) MG/3ML SOLN Take 3 mLs by nebulization every 6 (six) hours as needed (wheezing/shortness of breath).    [provider]  isosorbide mononitrate (IMDUR) 30 MG 24 hr tablet Take 1 tablet (30 mg total) by mouth daily. 08/05/16 11/07/17  Manson Passey, PA  levothyroxine (SYNTHROID, LEVOTHROID) 100 MCG tablet Take 1 tablet (100 mcg total) by mouth daily before breakfast. 11/12/17   Barnetta Chapel, MD  linaclotide (LINZESS) 145 MCG CAPS capsule Take 290 mcg by mouth daily.     [provider]  Menthol, Topical Analgesic, (BIOFREEZE) 4 % GEL Apply 1 application topically 2 (two) times daily.    [provider]  multivitamin (RENA-VIT) TABS tablet Take 1 tablet by mouth daily.     [provider]  mupirocin ointment (BACTROBAN) 2 % Place 1 application into the nose 2 (two) times daily. 11/12/17   Barnetta Chapel, MD  nitroGLYCERIN (NITROSTAT) 0.4 MG SL tablet Place 0.4 mg under the tongue every 5 (five) minutes as needed for chest pain (MAX 3 TABLETS).     [provider]  predniSONE (DELTASONE) 10 MG tablet 40 mg po once daily for 3 days, then 30 mg po once daily for 3 days, then 20  mg po once daily for 3 days and then 10 mg po once daily for 3  days and stop. 11/12/17   Berton Mount I, MD  torsemide (DEMADEX) 20 MG tablet Take 40 mg by mouth daily.     [provider]  warfarin (COUMADIN) 2 MG tablet Take 2 mg by mouth daily.    [provider]    Family History Family History  Problem Relation Age of Onset  . Ovarian cancer Mother        Deceased  . Epilepsy Father        Deceased    Social History Social History   Tobacco Use  . Smoking status: Never Smoker  . Smokeless tobacco: Never Used  Substance Use Topics  . Alcohol use: No  . Drug use: No     Allergies   Nubain [nalbuphine hcl]; Codeine; and Darvocet [propoxyphene n-acetaminophen]   Review of Systems Review of Systems   Physical Exam Updated Vital Signs BP (!) 96/50   Pulse 83   Temp (!) 97.3 F (36.3 C) (Temporal)   Resp 20   SpO2 99%   Physical Exam  Constitutional: She appears well-developed and well-nourished.  HENT:  Head: Normocephalic and atraumatic.  Eyes: Pupils are equal, round, and reactive to light. EOM are normal.  Neck: JVD present.  Cardiovascular: Normal rate and regular rhythm.  Pulmonary/Chest: She is in respiratory distress. She has rales.  Abdominal: Soft. She exhibits no distension. There is no tenderness.  Musculoskeletal: She exhibits edema.  Skin:  cool  Vitals reviewed.    ED Treatments / Results  Labs (all labs ordered are listed, but only abnormal results are displayed) Labs Reviewed  BASIC METABOLIC PANEL  CBC WITH DIFFERENTIAL/PLATELET  BRAIN NATRIURETIC PEPTIDE  PROTIME-INR  I-STAT VENOUS BLOOD GAS, ED  I-STAT TROPONIN, ED    EKG None ED ECG REPORT   Date: 12/04/2017  Rate: 72  Rhythm: normal sinus rhythm  QRS Axis: normal  Intervals: normal  ST/T Wave abnormalities: normal  Conduction Disutrbances:left bundle branch block  Narrative Interpretation:   Old EKG Reviewed: changes noted, improved from  previous  I have personally reviewed the EKG tracing and agree with the computerized printout as noted.  Radiology Dg Chest Portable 1 View  Result Date: 12/04/2017 CLINICAL DATA:  Respiratory distress. EXAM: PORTABLE CHEST 1 VIEW COMPARISON:  Chest x-ray 11/07/2017. FINDINGS: Cardiac pacer with lead tip over the right atrium right ventricle. Prior cardiac valve replacement. Cardiomegaly. Diffuse bilateral pulmonary interstitial prominence and small bilateral pleural effusions. Findings consistent with CHF. IMPRESSION: 1. Cardiac pacer with lead tips over the right atrium and right ventricle. Prior cardiac valve replacement. 2. Findings suggesting congestive heart failure with bilateral pulmonary interstitial edema and bilateral pleural effusions. Electronically Signed   By: Maisie Fus  Register   On: 12/04/2017 10:56    Procedures .Critical Care Performed by: Arthor Captain, PA-C Authorized by: Arthor Captain, PA-C   Critical care provider statement:    Critical care time (minutes):  60   Critical care was necessary to treat or prevent imminent or life-threatening deterioration of the following conditions:  Respiratory failure   Critical care was time spent personally by me on the following activities:  Discussions with consultants, evaluation of patient's response to treatment, examination of patient, ordering and performing treatments and interventions, ordering and review of laboratory studies, ordering and review of radiographic studies, pulse oximetry, re-evaluation of patient's condition, obtaining history from patient or surrogate, review of old charts and blood draw for specimens   (including critical care time)  Medications Ordered in ED Medications  furosemide (  LASIX) injection 40 mg (has no administration in time range)  nitroGLYCERIN (NITROGLYN) 2 % ointment 1 inch (1 inch Topical Given 12/04/17 1124)     Initial Impression / Assessment and Plan / ED Course  I have reviewed  the triage vital signs and the nursing notes.  Pertinent labs & imaging results that were available during my care of the patient were reviewed by me and considered in my medical decision making (see chart for details).  Clinical Course as of Dec 04 1720  Fri Dec 04, 2017  1157 Unable to establish IV access or blood draw after multiple attempts by multiple providers, I tried US guided IV without success. IV team consulted   [AH]  1329 B Natriuretic Peptide(!): 1,135.5 [AH]  1338 Patient INR supratherapeutic. Hold coumadin  INR(!!): 4.65 [AH]    Clinical Course User Index [AH] Arthor Captain, PA-C    Patient with apparent CHF exacerbation with acute respiratory distress on BiPAP.  He is gathered from EMR and the patient's record along with EMS report.  Recently reviewed her PA chest film which showed diffuse edema.  We had significant difficulty establishing vascular access on the patient and this delayed her care however she was improved with BiPAP and her rate of breathing decreased.  Patient was given IV furosemide.  She is on Demadex and likely needs a higher dose.  She was also given Nitropaste and her pressures dropped somewhat however when this was removed they improved.  Patient also noted to be supratherapeutic on her INR and she should have her Coumadin doses withheld.  I discussed the case with the hospitalist service who will admit the patient for further work-up stabilization in the stepdown unit.  Final Clinical Impressions(s) / ED Diagnoses   Final diagnoses:  Acute respiratory failure, unspecified whether with hypoxia or hypercapnia (HCC)  Acute on chronic congestive heart failure, unspecified heart failure type (HCC)  Elevated brain natriuretic peptide (BNP) level  Elevated troponin  Supratherapeutic INR    ED Discharge Orders    None       Arthor Captain, PA-C 12/04/17 1726    Little, Ambrose Finland, MD 12/06/17 2311

## 2017-12-04 NOTE — Progress Notes (Signed)
Transported pt from ED to 4E on BIPAP V60

## 2017-12-04 NOTE — ED Notes (Signed)
PA aware patient has no output.

## 2017-12-04 NOTE — ED Notes (Signed)
Bladder scan completed, 68ml resulted.

## 2017-12-04 NOTE — ED Notes (Signed)
PA aware patient does not have IV, Advised to place nitro ointment on without.

## 2017-12-04 NOTE — Progress Notes (Signed)
Received patient from ED to room 4e03, patient on bipap, Purewick initiated as patient states she wears a brief and per report 40 IV lasix given in ED department. Upon skin assessment, patient rt ankle bone slightly red does not blanch and rt heel red but does blanch. Pillows placed under heels.  Left heel and bone no redness noted at this time. Left buttock cheek with  Small round red area with skin which apears to be healing. Pink foam applied to this area. Vital signs obtained and monitor placed and CCMD made aware. Will monitor patient. Ivory Maduro, Randall An RN

## 2017-12-04 NOTE — Progress Notes (Signed)
ANTICOAGULATION CONSULT NOTE - Initial Consult  Pharmacy Consult for warfarin Indication: mechanical valve  Allergies  Allergen Reactions  . Nubain [Nalbuphine Hcl] Hives    Went into cardiac arrest   . Codeine Hives and Nausea Only    Has tolerated Oxycodone  . Darvocet [Propoxyphene N-Acetaminophen] Nausea Only     Vital Signs: Temp: 97.3 F (36.3 C) (08/16 1134) Temp Source: Temporal (08/16 1134) BP: 101/85 (08/16 1415) Pulse Rate: 87 (08/16 1415)  Labs: Recent Labs    12/04/17 1037  HGB 11.6*  HCT 37.2  PLT 177  LABPROT 43.5*  INR 4.65*  CREATININE 2.08*    CrCl cannot be calculated (Unknown ideal weight.).   Medical History: Past Medical History:  Diagnosis Date  . Acute on chronic renal failure [facility])    sees Dr Allena Katz   . Anemia    Acute blood loss  . Aortic regurgitation   . Aortic stenosis 10/13/2012   Low EF, low gradient with severe aortic stenosis confirmed by dobutamine stress echocardiogram s/p TAVR 12/2012  . Asthma   . Atrial fibrillation (HCC)    tachy-brady syndrome with <1% recurrent PAF since pacemaker placement  . Cataracts, bilateral   . Chronic combined systolic and diastolic CHF (congestive heart failure) (HCC)   . Chronic lower back pain   . CKD (chronic kidney disease)   . Coronary artery disease involving native coronary artery of native heart   . Dementia    Without behavioral disturbance  . Dysrhythmia   . Fibromyalgia   . Gastroesophageal reflux disease   . H/O dizziness   . H/O urinary frequency   . H/O: stroke   . Hard of hearing   . Headache    hx of migraines   . Heart murmur   . History of blood transfusion   . History of bronchitis   . History of kidney stones   . History of urinary tract infection   . HLD (hyperlipidemia)   . HTN (hypertension)   . Hypothyroidism   . Neuropathy   . Nonischemic cardiomyopathy (HCC)   . On home oxygen therapy    patient uses at nite- 2L- has not used in > 6 months per patient   . Osteoarthritis   . Osteoporosis   . Pneumonia    hx of x 3   . PONV (postoperative nausea and vomiting)   . Presence of permanent cardiac pacemaker   . Pulmonary embolism (HCC)    HISTORY OF, the pt. had a recurrent bilateral pulmonary emboli in 2005, on warfarin therapy and at which time she under went implantation of IVC filter  . Repeated falls   . Rupture of right patellar tendon   . Sciatica   . Shortness of breath dyspnea    with exertion   . Sleep apnea    uses oxygen at night and PRN- not used since > 6 months / DOES NOT USE  C-PAP  . Spinal stenosis   . Stroke (HCC)   . Symptomatic bradycardia   . Symptomatic bradycardia 2012   s/p Medtronic PPM  . Syncope   . Urinary incontinence   . Urinary urgency    Assessment: 55 yoF on warfarin PTA for mechanical valve. Last dose PTA 8/15. INR on admit 4.65. HgB 11.6. Will hold anticoagulation tonight and reassess tomorrow.   Goal of Therapy:  INR 2-3 Monitor platelets by anticoagulation protocol: Yes   Plan:  HOLD Warfarin  Daily INR/CBC Monitor for s/sx of bleeding  Ruben Im, PharmD Clinical Pharmacist 12/04/2017 2:36 PM Please check AMION for all [facility] Pharmacy numbers

## 2017-12-05 DIAGNOSIS — R0602 Shortness of breath: Secondary | ICD-10-CM

## 2017-12-05 DIAGNOSIS — I5043 Acute on chronic combined systolic (congestive) and diastolic (congestive) heart failure: Secondary | ICD-10-CM

## 2017-12-05 DIAGNOSIS — R748 Abnormal levels of other serum enzymes: Secondary | ICD-10-CM

## 2017-12-05 DIAGNOSIS — I10 Essential (primary) hypertension: Secondary | ICD-10-CM

## 2017-12-05 DIAGNOSIS — N179 Acute kidney failure, unspecified: Secondary | ICD-10-CM

## 2017-12-05 DIAGNOSIS — J9600 Acute respiratory failure, unspecified whether with hypoxia or hypercapnia: Secondary | ICD-10-CM

## 2017-12-05 DIAGNOSIS — I509 Heart failure, unspecified: Secondary | ICD-10-CM

## 2017-12-05 DIAGNOSIS — R7989 Other specified abnormal findings of blood chemistry: Secondary | ICD-10-CM

## 2017-12-05 LAB — BASIC METABOLIC PANEL
BUN: 43 mg/dL — ABNORMAL HIGH (ref 8–23)
Calcium: 9.1 mg/dL (ref 8.9–10.3)
Creatinine, Ser: 2.43 mg/dL — ABNORMAL HIGH (ref 0.44–1.00)
GFR calc non Af Amer: 17 mL/min — ABNORMAL LOW (ref >60)
Glucose, Bld: 123 mg/dL — ABNORMAL HIGH (ref 70–99)
Sodium: 143 mmol/L (ref 135–145)

## 2017-12-05 LAB — BASIC METABOLIC PANEL WITH GFR
Anion gap: 16 — ABNORMAL HIGH (ref 5–15)
CO2: 26 mmol/L (ref 22–32)
Chloride: 101 mmol/L (ref 98–111)
GFR calc Af Amer: 20 mL/min — ABNORMAL LOW (ref >60)
Potassium: 4.8 mmol/L (ref 3.5–5.1)

## 2017-12-05 LAB — PROTIME-INR
INR: 3.59
Prothrombin Time: 35.5 seconds — ABNORMAL HIGH (ref 11.4–15.2)

## 2017-12-05 LAB — CBC WITH DIFFERENTIAL/PLATELET
Abs Immature Granulocytes: 0 10*3/uL (ref 0.0–0.1)
Basophils Absolute: 0 10*3/uL (ref 0.0–0.1)
Basophils Relative: 0 %
Eosinophils Absolute: 0 10*3/uL (ref 0.0–0.7)
Eosinophils Relative: 0 %
HCT: 31.3 % — ABNORMAL LOW (ref 36.0–46.0)
Hemoglobin: 10 g/dL — ABNORMAL LOW (ref 12.0–15.0)
Immature Granulocytes: 1 %
Lymphocytes Relative: 11 %
Lymphs Abs: 0.9 K/uL (ref 0.7–4.0)
MCH: 31.7 pg (ref 26.0–34.0)
MCHC: 31.9 g/dL (ref 30.0–36.0)
MCV: 99.4 fL (ref 78.0–100.0)
Monocytes Absolute: 0.4 10*3/uL (ref 0.1–1.0)
Monocytes Relative: 5 %
Neutro Abs: 7.2 10*3/uL (ref 1.7–7.7)
Neutrophils Relative %: 83 %
Platelets: 197 K/uL (ref 150–400)
RBC: 3.15 MIL/uL — ABNORMAL LOW (ref 3.87–5.11)
RDW: 14.6 % (ref 11.5–15.5)
WBC: 8.5 10*3/uL (ref 4.0–10.5)

## 2017-12-05 LAB — TROPONIN I: Troponin I: 0.15 ng/mL (ref <0.03)

## 2017-12-05 MED ORDER — SUMATRIPTAN SUCCINATE 50 MG PO TABS
50.0000 mg | ORAL_TABLET | Freq: Once | ORAL | Status: AC
Start: 2017-12-05 — End: 2017-12-05
  Administered 2017-12-05: 50 mg via ORAL
  Filled 2017-12-05: qty 1

## 2017-12-05 MED ORDER — TRAMADOL HCL 50 MG PO TABS
50.0000 mg | ORAL_TABLET | Freq: Once | ORAL | Status: AC
  Administered 2017-12-05: 50 mg via ORAL
  Filled 2017-12-05: qty 1

## 2017-12-05 MED ORDER — FUROSEMIDE 10 MG/ML IJ SOLN
120.0000 mg | Freq: Two times a day (BID) | INTRAVENOUS | Status: DC
  Administered 2017-12-05: 120 mg via INTRAVENOUS
  Filled 2017-12-05 (×2): qty 12
  Filled 2017-12-05: qty 10
  Filled 2017-12-05: qty 12

## 2017-12-05 NOTE — Progress Notes (Signed)
ANTICOAGULATION CONSULT NOTE   Pharmacy Consult for warfarin Indication: TAVR/AFib  Allergies  Allergen Reactions  . Nubain [Nalbuphine Hcl] Hives    Went into cardiac arrest   . Codeine Hives and Nausea Only    Has tolerated Oxycodone  . Darvocet [Propoxyphene N-Acetaminophen] Nausea Only     Vital Signs: Temp: 98.4 F (36.9 C) (08/17 1128) Temp Source: Oral (08/17 1128) BP: 112/69 (08/17 1128) Pulse Rate: 89 (08/17 1128)  Labs: Recent Labs    12/04/17 1037 12/04/17 1650 12/04/17 2158 12/05/17 0348  HGB 11.6*  --   --  10.0*  HCT 37.2  --   --  31.3*  PLT 177  --   --  197  LABPROT 43.5*  --   --  35.5*  INR 4.65*  --   --  3.Jodi  CREATININE 2.08*  --   --  2.43*  TROPONINI  --  0.18* 0.18* 0.15*    Estimated Creatinine Clearance: 15 mL/min (A) (by C-G formula based on SCr of 2.43 mg/dL (H)).   Medical History: Past Medical History:  Diagnosis Date  . Acute on chronic renal failure [facility])    sees Dr Allena Katz   . Anemia    Acute blood loss  . Aortic regurgitation   . Aortic stenosis 10/13/2012   Low EF, low gradient with severe aortic stenosis confirmed by dobutamine stress echocardiogram s/p TAVR 12/2012  . Asthma   . Atrial fibrillation (HCC)    tachy-brady syndrome with <1% recurrent PAF since pacemaker placement  . Cataracts, bilateral   . Chronic combined systolic and diastolic CHF (congestive heart failure) (HCC)   . Chronic lower back pain   . CKD (chronic kidney disease)   . Coronary artery disease involving native coronary artery of native heart   . Dementia    Without behavioral disturbance  . Dysrhythmia   . Fibromyalgia   . Gastroesophageal reflux disease   . H/O dizziness   . H/O urinary frequency   . H/O: stroke   . Hard of hearing   . Headache    hx of migraines   . Heart murmur   . History of blood transfusion   . History of bronchitis   . History of kidney stones   . History of urinary tract infection   . HLD (hyperlipidemia)   .  HTN (hypertension)   . Hypothyroidism   . Neuropathy   . Nonischemic cardiomyopathy (HCC)   . On home oxygen therapy    patient uses at nite- 2L- has not used in > 6 months per patient  . Osteoarthritis   . Osteoporosis   . Pneumonia    hx of x 3   . PONV (postoperative nausea and vomiting)   . Presence of permanent cardiac pacemaker   . Pulmonary embolism (HCC)    HISTORY OF, the pt. had a recurrent bilateral pulmonary emboli in 2005, on warfarin therapy and at which time she under went implantation of IVC filter  . Repeated falls   . Rupture of right patellar tendon   . Sciatica   . Shortness of breath dyspnea    with exertion   . Sleep apnea    uses oxygen at night and PRN- not used since > 6 months / DOES NOT USE  C-PAP  . Spinal stenosis   . Stroke (HCC)   . Symptomatic bradycardia   . Symptomatic bradycardia 2012   s/p Medtronic PPM  . Syncope   . Urinary incontinence   .  Urinary urgency    Assessment:  [name] on warfarin PTA for TAVR and Afib . Last dose PTA 8/15. INR on admit 4.65. PTA regimen per nursing home is 4 mg daily.   INR today decreased to 3.Jodi. Remains on PTA amiodarone. Hgb down to 10, plt 197. No s/sx of bleeding documented.  Goal of Therapy:  INR 2-3 Monitor platelets by anticoagulation protocol: Yes   Plan:  Continue to hold warfarin Daily INR/CBC Monitor for s/sx of bleeding  Girard Cooter, PharmD Clinical Pharmacist  Pager: [phone] Phone: [phone] 12/05/2017 1:02 PM Please check AMION for all [facility] Pharmacy numbers

## 2017-12-05 NOTE — Plan of Care (Signed)
  Problem: Elimination: Goal: Will not experience complications related to urinary retention Outcome: Progressing   Problem: Health Behavior/Discharge Planning: Goal: Ability to manage health-related needs will improve Outcome: Not Progressing   Problem: Clinical Measurements: Goal: Ability to maintain clinical measurements within normal limits will improve Outcome: Not Progressing Goal: Diagnostic test results will improve Outcome: Not Progressing Goal: Respiratory complications will improve Outcome: Not Progressing

## 2017-12-05 NOTE — Consult Note (Signed)
Cardiology Consultation:   Patient ID: [name]; [id number]; [birth year]   Admit date: 12/04/2017 Date of Consult: 12/05/2017  Primary Care Provider: Pearson Grippe, MD Primary Cardiologist: Tonny Bollman, MD   Patient Profile:   [name] is a 84 y.o. female with a hx of NICM, chronic systolic and diastolic heart failure 9EF 25-30% with grade 2 diastolic dysfunction), severe AS s/p TAVR 2014, paroxsymal atrial fibrillation, HTN, CKD stage IV, chronic respiratory failure on O2, OSA, bradycardia s/p pacemaker who is being seen today for the evaluation of acute heart failure and respiratory distress at the request of Dr. Jerral Ralph.  History of Present Illness:   Ms. Guin was discharged from the hospital on 11/12/17 (see discharge summary) after a similar episode of respiratory distress. She is unsure what happened, but she began feeling severely short of breath at her nursing facility. They have her "five shots of something" and then called EMS. The only other item she can tell me is that she was given doses of diuretics this week and didn't urinate for several days. She cannot give other history beyond this. Per the medical record, she had increasing work of breathing and shortness of breath, and her systolic blood pressure was over 200. She noted chest pressure in those notes, but she cannot describe this to me today. On arrival to ER she was placed on bipap.   Her main concern at this time is for a productive cough. She does not report fevers or chills to me.   Past Medical History:  Diagnosis Date  . Acute on chronic renal failure [facility])    sees Dr Allena Katz   . Anemia    Acute blood loss  . Aortic regurgitation   . Aortic stenosis 10/13/2012   Low EF, low gradient with severe aortic stenosis confirmed by dobutamine stress echocardiogram s/p TAVR 12/2012  . Asthma   . Atrial fibrillation (HCC)    tachy-brady syndrome with <1% recurrent PAF since pacemaker placement  . Cataracts,  bilateral   . Chronic combined systolic and diastolic CHF (congestive heart failure) (HCC)   . Chronic lower back pain   . CKD (chronic kidney disease)   . Coronary artery disease involving native coronary artery of native heart   . Dementia    Without behavioral disturbance  . Dysrhythmia   . Fibromyalgia   . Gastroesophageal reflux disease   . H/O dizziness   . H/O urinary frequency   . H/O: stroke   . Hard of hearing   . Headache    hx of migraines   . Heart murmur   . History of blood transfusion   . History of bronchitis   . History of kidney stones   . History of urinary tract infection   . HLD (hyperlipidemia)   . HTN (hypertension)   . Hypothyroidism   . Neuropathy   . Nonischemic cardiomyopathy (HCC)   . On home oxygen therapy    patient uses at nite- 2L- has not used in > 6 months per patient  . Osteoarthritis   . Osteoporosis   . Pneumonia    hx of x 3   . PONV (postoperative nausea and vomiting)   . Presence of permanent cardiac pacemaker   . Pulmonary embolism (HCC)    HISTORY OF, the pt. had a recurrent bilateral pulmonary emboli in 2005, on warfarin therapy and at which time she under went implantation of IVC filter  . Repeated falls   . Rupture of right  patellar tendon   . Sciatica   . Shortness of breath dyspnea    with exertion   . Sleep apnea    uses oxygen at night and PRN- not used since > 6 months / DOES NOT USE  C-PAP  . Spinal stenosis   . Stroke (HCC)   . Symptomatic bradycardia   . Symptomatic bradycardia 2012   s/p Medtronic PPM  . Syncope   . Urinary incontinence   . Urinary urgency     Past Surgical History:  Procedure Laterality Date  . ABDOMINAL HYSTERECTOMY    . APPENDECTOMY    . BACK SURGERY    . BUNIONECTOMY    . CARDIAC CATHETERIZATION    . CATARACT EXTRACTION    . CENTRAL VENOUS CATHETER INSERTION Left 12/21/2012   Procedure: INSERTION CENTRAL LINE ADULT;  Surgeon: Tonny Bollman, MD;  Location: [facility] OR;  Service: Open  Heart Surgery;  Laterality: Left;  . ELBOW SURGERY     bilat   . INTRAOPERATIVE TRANSESOPHAGEAL ECHOCARDIOGRAM N/A 12/21/2012   Procedure: INTRAOPERATIVE TRANSESOPHAGEAL ECHOCARDIOGRAM;  Surgeon: Tonny Bollman, MD;  Location: [facility] OR;  Service: Open Heart Surgery;  Laterality: N/A;  . KNEE SURGERY Left   . LEFT AND RIGHT HEART CATHETERIZATION WITH CORONARY ANGIOGRAM N/A 10/18/2012   Procedure: LEFT AND RIGHT HEART CATHETERIZATION WITH CORONARY ANGIOGRAM;  Surgeon: Tonny Bollman, MD;  Location: [facility] CATH LAB;  Service: Cardiovascular;  Laterality: N/A;  . NASAL SEPTUM SURGERY    . NOSE SURGERY     X 2  . ORIF PATELLA Right 03/08/2015   Procedure:  OPEN REDUCTION INTERNAL FIXATION RIGHT  PATELLA TENDON AVULSION;  Surgeon: Durene Romans, MD;  Location: [facility] ORS;  Service: Orthopedics;  Laterality: Right;  . PACEMAKER INSERTION     Medtronic  . PATELLAR TENDON REPAIR Right 06/25/2015   Procedure: RIGHT PATELLA TENDON REVISION/REPAIR;  Surgeon: Durene Romans, MD;  Location: [facility] ORS;  Service: Orthopedics;  Laterality: Right;  . SHOULDER SURGERY     bilat   . THYROIDECTOMY, PARTIAL    . TONSILLECTOMY    . TOTAL KNEE ARTHROPLASTY Right 12/04/2014   Procedure: RIGHT TOTAL  KNEE ARTHROPLASTY;  Surgeon: Durene Romans, MD;  Location: [facility] ORS;  Service: Orthopedics;  Laterality: Right;  . TRANSCATHETER AORTIC VALVE REPLACEMENT, TRANSFEMORAL  12/21/2012   a. 29mm Edwards Sapien XT transcatheter heart valve placed via open left transfemoral approach b. Intra-op TEE: well-seated bioprosthetic aortic valve with mean gradient 2 mmHg, trivial AI, mild MR, EF 30-35%  . TRANSCATHETER AORTIC VALVE REPLACEMENT, TRANSFEMORAL N/A 12/21/2012   Procedure: TRANSCATHETER AORTIC VALVE REPLACEMENT, TRANSFEMORAL;  Surgeon: Tonny Bollman, MD;  Location: [facility] OR;  Service: Open Heart Surgery;  Laterality: N/A;     Home Medications:  Prior to Admission medications   Medication Sig Start Date End Date Taking? Authorizing Provider    allopurinol (ZYLOPRIM) 100 MG tablet Take 100 mg by mouth daily.   Yes [provider]  amiodarone (PACERONE) 100 MG tablet Take 100 mg by mouth daily.   Yes [provider]  atorvastatin (LIPITOR) 10 MG tablet Take 10 mg by mouth daily at 6 PM.    Yes [provider]  benzonatate (TESSALON) 100 MG capsule Take 100 mg by mouth 2 (two) times daily as needed for cough.   Yes [provider]  calcitRIOL (ROCALTROL) 0.25 MCG capsule Take 0.25 mcg by mouth daily.    Yes [provider]  cholecalciferol (VITAMIN D) 1000 units tablet Take 2,000 Units by mouth  at bedtime.   Yes [provider]  cycloSPORINE (RESTASIS) 0.05 % ophthalmic emulsion Place 1 drop into both eyes 2 (two) times daily.    Yes [provider]  donepezil (ARICEPT) 10 MG tablet Take 10 mg by mouth at bedtime.    Yes [provider]  DULoxetine (CYMBALTA) 60 MG capsule Take 60 mg by mouth daily at 6 PM.    Yes [provider]  ferrous sulfate 325 (65 FE) MG tablet Take 325 mg by mouth daily.    Yes [provider]  gabapentin (NEURONTIN) 100 MG capsule Take 200 mg by mouth 2 (two) times daily.   Yes [provider]  ipratropium-albuterol (DUONEB) 0.5-2.5 (3) MG/3ML SOLN Take 3 mLs by nebulization every 6 (six) hours as needed (wheezing/shortness of breath).   Yes [provider]  isosorbide mononitrate (IMDUR) 30 MG 24 hr tablet Take 1 tablet (30 mg total) by mouth daily. 08/05/16 12/04/17 Yes Bhagat, Bhavinkumar, PA  levothyroxine (SYNTHROID, LEVOTHROID) 100 MCG tablet Take 1 tablet (100 mcg total) by mouth daily before breakfast. 11/12/17  Yes Berton Mount I, MD  lidocaine (LIDODERM) 5 % Place 1 patch onto the skin daily. Apply to lower back as needed for pain.   Yes [provider]  linaclotide (LINZESS) 290 MCG CAPS capsule Take 290 mcg by mouth daily.    Yes [provider]  Menthol, Topical Analgesic,  (BIOFREEZE) 4 % GEL Apply 1 application topically 2 (two) times daily.   Yes [provider]  mineral oil enema Place 1 enema rectally once. Administer if KUB is positive.   Yes [provider]  multivitamin (RENA-VIT) TABS tablet Take 1 tablet by mouth daily.    Yes [provider]  mupirocin ointment (BACTROBAN) 2 % Place 1 application into the nose 2 (two) times daily. 11/12/17  Yes Barnetta Chapel, MD  nitroGLYCERIN (NITROSTAT) 0.4 MG SL tablet Place 0.4 mg under the tongue every 5 (five) minutes as needed for chest pain (MAX 3 TABLETS).    Yes [provider]  polyethylene glycol (MIRALAX / GLYCOLAX) packet Take 17 g by mouth daily.   Yes [provider]  torsemide (DEMADEX) 20 MG tablet Take 40 mg by mouth daily.    Yes [provider]  warfarin (COUMADIN) 4 MG tablet Take 4 mg by mouth daily.    Yes [provider]  HYDROcodone-acetaminophen (NORCO/VICODIN) 5-325 MG tablet Take 1 tablet by mouth every 4 (four) hours as needed for severe pain. 11/12/17   Barnetta Chapel, MD    Inpatient Medications: Scheduled Meds: . allopurinol  100 mg Oral Daily  . amiodarone  100 mg Oral Daily  . atorvastatin  10 mg Oral q1800  . calcitRIOL  0.25 mcg Oral Daily  . Chlorhexidine Gluconate Cloth  6 each Topical Q0600  . cholecalciferol  2,000 Units Oral QHS  . cycloSPORINE  1 drop Both Eyes BID  . docusate sodium  100 mg Oral BID  . donepezil  10 mg Oral QHS  . DULoxetine  60 mg Oral q1800  . ferrous sulfate  325 mg Oral Daily  . gabapentin  200 mg Oral BID  . isosorbide mononitrate  30 mg Oral Daily  . levothyroxine  100 mcg Oral QAC breakfast  . lidocaine  1 patch Transdermal Q24H  . linaclotide  290 mcg Oral Daily  . multivitamin  1 tablet Oral Daily  . mupirocin ointment  1 application Nasal BID  . MUSCLE RUB  Topical BID  . polyethylene glycol  17 g Oral Daily  . senna  1 tablet Oral BID  . sodium chloride flush  3 mL  Intravenous Q12H  . Warfarin - Pharmacist Dosing Inpatient   Does not apply q1800   Continuous Infusions: . sodium chloride    . furosemide     PRN Meds: sodium chloride, acetaminophen **OR** acetaminophen, benzonatate, HYDROcodone-acetaminophen, ipratropium-albuterol, nitroGLYCERIN, ondansetron **OR** ondansetron (ZOFRAN) IV, sodium chloride flush  Allergies:    Allergies  Allergen Reactions  . Nubain [Nalbuphine Hcl] Hives    Went into cardiac arrest   . Codeine Hives and Nausea Only    Has tolerated Oxycodone  . Darvocet [Propoxyphene N-Acetaminophen] Nausea Only    Social History:   Social History   Socioeconomic History  . Marital status: Divorced    Spouse name: Not on file  . Number of children: Not on file  . Years of education: Not on file  . Highest education level: Not on file  Occupational History  . Not on file  Social Needs  . Financial resource strain: Not on file  . Food insecurity:    Worry: Not on file    Inability: Not on file  . Transportation needs:    Medical: Not on file    Non-medical: Not on file  Tobacco Use  . Smoking status: Never Smoker  . Smokeless tobacco: Never Used  Substance and Sexual Activity  . Alcohol use: No  . Drug use: No  . Sexual activity: Not Currently  Lifestyle  . Physical activity:    Days per week: Not on file    Minutes per session: Not on file  . Stress: Not on file  Relationships  . Social connections:    Talks on phone: Not on file    Gets together: Not on file    Attends religious service: Not on file    Active member of club or organization: Not on file    Attends meetings of clubs or organizations: Not on file    Relationship status: Not on file  . Intimate partner violence:    Fear of current or ex partner: Not on file    Emotionally abused: Not on file    Physically abused: Not on file    Forced sexual activity: Not on file  Other Topics Concern  . Not on file  Social History Narrative  . Not  on file    Family History:    Family History  Problem Relation Age of Onset  . Ovarian cancer Mother        Deceased  . Epilepsy Father        Deceased     ROS:  Please see the history of present illness.  Review of Systems  Constitutional: Negative for chills and fever.  HENT: Positive for congestion and hearing loss.   Eyes: Negative for pain.  Respiratory: Positive for cough, sputum production and shortness of breath.   Cardiovascular: Positive for PND. Negative for chest pain, palpitations, orthopnea, claudication and leg swelling.  Gastrointestinal: Negative for abdominal pain, blood in stool and melena.  Genitourinary: Negative for dysuria and hematuria.  Musculoskeletal: Negative for falls.  Neurological: Negative for loss of consciousness and weakness.  Endo/Heme/Allergies: Does not bruise/bleed easily.   All other ROS reviewed and negative.     Physical Exam/Data:   Vitals:   12/05/17 0400 12/05/17 0830 12/05/17 0853 12/05/17 1128  BP: (!) 111/54 132/65  112/69  Pulse: 62 62  89  Resp: (!) 25 14 (!) 21 (!) 21  Temp:  97.9 F (36.6 C)  98.4 F (36.9 C)  TempSrc:  Axillary  Oral  SpO2: 100% 99% 100% 99%  Weight:        Intake/Output Summary (Last 24 hours) at 12/05/2017 1422 Last data filed at 12/05/2017 0541 Gross per 24 hour  Intake 0 ml  Output 550 ml  Net -550 ml   Filed Weights   12/04/17 2147 12/05/17 0359  Weight: 74.4 kg 73.8 kg   Body mass index is 34 kg/m.  General:  Well nourished, well developed. Resting in bed, [place] in place. Shortwinded with prolonged speech HEENT: normal Lymph: no adenopathy Neck: difficult to fully assess, but JVD at least to low neck at 60 degrees Endocrine:  No thryomegaly Vascular: No carotid bruits; FA pulses 2+ bilaterally without bruits  Cardiac:  Regular S1 and S2, 1/6 SEM, no r/g appreciated Lungs:  Coarse bilaterally, shallow breathing, tachypneic. No wheezing but +rhonchi/rales Abd: soft, nontender, no  hepatomegaly  Ext: 1+ LE bilateral edema Musculoskeletal:  No deformities Skin: warm and dry  Neuro:   no focal abnormalities noted Psych:  Normal affect   EKG:  The EKG was personally reviewed and demonstrates:  NSR, LBBB Telemetry:  Telemetry was personally reviewed and demonstrates:  NSR, LBBB, PVCs  Relevant CV Studies: None yet this admission Most recent echo: 11/08/17 Left ventricle: The cavity size was moderately dilated. Wall   thickness was increased in a pattern of mild LVH. Systolic   function was severely reduced. The estimated ejection fraction   was in the range of 25% to 30%. Diffuse hypokinesis, with   akinesis of specific walls below. Features are consistent with a   pseudonormal left ventricular filling pattern, with concomitant   abnormal relaxation and increased filling pressure (grade 2   diastolic dysfunction). - Regional wall motion abnormality: Akinesis of the mid   anteroseptal, basal-mid inferoseptal, and mid inferior   myocardium; hypokinesis of the mid anterior, basal anteroseptal,   apical septal, apical lateral, and apical myocardium. - Aortic valve: A prosthesis was present and functioning normally.   The prosthesis had a normal range of motion. The sewing ring   appeared normal, had no rocking motion, and showed no evidence of   dehiscence. There was mild regurgitation. Mean gradient (S): 11   mm Hg. Peak gradient (S): 18 mm Hg. Valve area (VTI): 1.91 cm^2.   Valve area (Vmax): 1.73 cm^2. Valve area (Vmean): 1.71 cm^2. - Mitral valve: Mobility was restricted. There was mild   regurgitation directed eccentrically. - Left atrium: The atrium was moderately dilated. - Atrial septum: No defect or patent foramen ovale was identified. - Tricuspid valve: There was mild regurgitation. - Pulmonic valve: There was mild regurgitation. - Pulmonary arteries: PA peak pressure: 32 mm Hg (S).  Impressions:  - LV function appears worse from prior. TAVR in  place, with   gradients slightly elevated from prior (peak 18, mean 11, AVA   1.91). Valvular AI also worse compared to prior.  Laboratory Data:  Chemistry Recent Labs  Lab 12/04/17 1037 12/05/17 0348  NA 139 143  K 4.1 4.8  CL 103 101  CO2 23 26  GLUCOSE 107* 123*  BUN 33* 43*  CREATININE 2.08* 2.43*  CALCIUM 9.4 9.1  GFRNONAA 21* 17*  GFRAA 24* 20*  ANIONGAP 13 16*    No results for input(s): PROT, ALBUMIN, AST, ALT, ALKPHOS, BILITOT in the last 168 hours. Hematology  Recent Labs  Lab 12/04/17 1037 12/05/17 0348  WBC 14.4* 8.5  RBC 3.63* 3.15*  HGB 11.6* 10.0*  HCT 37.2 31.3*  MCV 102.5* 99.4  MCH 32.0 31.7  MCHC 31.2 31.9  RDW 14.6 14.6  PLT 177 197   Cardiac Enzymes Recent Labs  Lab 12/04/17 1650 12/04/17 2158 12/05/17 0348  TROPONINI 0.18* 0.18* 0.15*    Recent Labs  Lab 12/04/17 1236  TROPIPOC 0.19*    BNP Recent Labs  Lab 12/04/17 1037  BNP 1,135.5*    DDimer No results for input(s): DDIMER in the last 168 hours.  Radiology/Studies:  Dg Chest Portable 1 View  Result Date: 12/04/2017 CLINICAL DATA:  Respiratory distress. EXAM: PORTABLE CHEST 1 VIEW COMPARISON:  Chest x-ray 11/07/2017. FINDINGS: Cardiac pacer with lead tip over the right atrium right ventricle. Prior cardiac valve replacement. Cardiomegaly. Diffuse bilateral pulmonary interstitial prominence and small bilateral pleural effusions. Findings consistent with CHF. IMPRESSION: 1. Cardiac pacer with lead tips over the right atrium and right ventricle. Prior cardiac valve replacement. 2. Findings suggesting congestive heart failure with bilateral pulmonary interstitial edema and bilateral pleural effusions. Electronically Signed   By: Maisie Fus  Register   On: 12/04/2017 10:56    Assessment and Plan:   Acute respiratory distress, acute on chronic systolic and diastolic heart failure. Suspect flash pulmonary edema. I am unsure if she truly did not make urine for several days or not. Will  need to monitor her urine output and renal function, as this was tenuous at her last admission -elevated BNP, flat troponins. Unlikely to be primary ischemic event -she has minimal swelling, but her lungs are very coarse. She is also 2 kg under her discharge weight. My concern is that she is intravascularly volume depleted but has pulmonary edema. Would hold on any further diuresis today and monitor her kidney function. She may need intermittent bipap to mobilize fluid in her lungs. -Cr elevated compared to recent baseline, appears to be rising. She may be having progressive kidney disease with acute on chronic kidney injury.  -her prior echo showed mild increase in her TAVR gradients, but it is unlikely that this is the cause of her progressive volume overload. -she is warm at this time, does not appear to be in cardiogenic shock. There was some question as to whether inotropes would be helpful. The difficulty is that the end point of inotropes is unclear. If we can get her to diuresis in the short term on inotropes, the likelihood would be that she would return to having heart failure again once these were stopped. Her rising Cr and decreasing renal function are concerning that she may not respond to diuretics as well as she has in the past, and if her renal disease progresses we have limited options for inotropes as well.  We can have our advanced heart failure colleagues weigh in on her care tomorrow. Given her frailty and comorbidities, I would suspect she is not a candidate for advanced therapies, but they can assist [place] with managing her fluid status.   We will continue to follow with you.  For questions or updates, please contact [facility] Please consult [email or web address] for contact info under Cardiology/STEMI.   Signed, Jodelle Red, MD  12/05/2017 2:22 PM

## 2017-12-05 NOTE — Progress Notes (Signed)
PROGRESS NOTE        PATIENT DETAILS Name: [name] Age: 84 y.o. Sex: female Date of Birth: 1933-01-05 Admit Date: 12/04/2017 Admitting Physician Delaine Lame, MD PCP:Kim, Fayrene Fearing, MD  Brief Narrative: Patient is a 84 y.o. female with history of chronic systolic heart failure, stage IV CKD, dementia, severe aortic stenosis status post TAVR 2014, PAF on warfarin, symptomatic bradycardia requiring permanent pacemaker implantation, chronic hypoxic respiratory failure on home O2 2.5 L/min,-presented with sudden onset of shortness of breath when she woke up on 8/16, she was found to be hypoxic and promptly transferred to the emergency room.  She was started on BiPAP, she was thought to have decompensated heart failure, given Lasix and admitted to the hospitalist service.  See below for further details.  Subjective: On BiPAP this morning-voice is muffled-but claims to be slightly better than yesterday.  Only 550 cc of urine output overnight (received 40 mg and then 80 mg of Lasix yesterday followed total of 120 mg).  Assessment/Plan: Acute on chronic hypoxic respiratory failure likely secondary to acute decompensated systolic heart failure (EF 25-30% by TTE on 11/08/2017) :her symptomatology of sudden onset of shortness of breath, along with elevated BNP and chest x-ray findings all suggestive of acute pulmonary edema.  She has some trace/1+ edema and is below her last discharge weight. Not sure exactly what provoked decompensated heart failure-but has only put out 550 cc in spite of getting 120 mg of Lasix yesterday.  Her creatinine has also bumped up slightly.  She is elderly and appears frail.  Since she is still somewhat short of breath and remains on BiPAP-and has only put out 550 cc-we will go ahead and increase Lasix to 120 mg twice daily.  Have consulted cardiology for further assistance.  Even though her procalcitonin is slightly elevated-she does not have fever  or leukocytosis, and her sudden onset of symptoms argue against pneumonia.  For now we will continue to manage her off antimicrobial therapy, and await cardiology evaluation.` In the meantime, have asked respiratory therapist to see if we can liberate off BiPAP and just put on regular home O2 regimen.  Elevated troponins: I suspect this is secondary demand ischemia, reviewed prior cardiology notes-she had a stress test in 2018 that although was high risk, did not show any ischemia.  Given her frailty, CKD stage IV-she has been managed medically which I think probably is the right strategy in this case as well.  Acute kidney injury on CKD stage IV: Given the fact that she is still requiring BiPAP this morning-and has only put out 550 cc of urine in spite of getting a total of 120 mg of Lasix overnight-we will need to tolerate some mild worsening of renal function in order for more adequate diuresis.  She may be starting to develop cardiorenal syndrome which is probably the cause of AKI.  Monitor renal function closely-if cardiology feels that she may benefit from inotropes then this may help with renal perfusion.  For now avoid nephrotoxic agents and follow electrolytes closely.  She does follow with nephrology-Dr. Allena Katz in the outpatient setting.  PAF: Continue amiodarone-INR slightly supratherapeutic-Coumadin being managed by pharmacy team.  History of symptomatic bradycardia requiring pacemaker implantation: Monitor on telemetry.  History of severe aortic stenosis requiring TAVR 2014  Dyslipidemia: Continue statin  Hypothyroidism: Continue Synthroid  Dementia: Continue  Aricept  OSA: CPAP when she is more stable-currently on BiPAP.  DVT Prophylaxis: Full dose anticoagulation with Coumadin  Code Status: Full code   Family Communication: None at bedside  Disposition Plan: Remain inpatient  Antimicrobial agents: Anti-infectives (From admission, onward)   None       Procedures: None  CONSULTS:  cardiology  Time spent: 35 minutes-Greater than 50% of this time was spent in counseling, explanation of diagnosis, planning of further management, and coordination of care.  MEDICATIONS: Scheduled Meds: . allopurinol  100 mg Oral Daily  . amiodarone  100 mg Oral Daily  . atorvastatin  10 mg Oral q1800  . calcitRIOL  0.25 mcg Oral Daily  . Chlorhexidine Gluconate Cloth  6 each Topical Q0600  . cholecalciferol  2,000 Units Oral QHS  . cycloSPORINE  1 drop Both Eyes BID  . docusate sodium  100 mg Oral BID  . donepezil  10 mg Oral QHS  . DULoxetine  60 mg Oral q1800  . ferrous sulfate  325 mg Oral Daily  . gabapentin  200 mg Oral BID  . isosorbide mononitrate  30 mg Oral Daily  . levothyroxine  100 mcg Oral QAC breakfast  . lidocaine  1 patch Transdermal Q24H  . linaclotide  290 mcg Oral Daily  . multivitamin  1 tablet Oral Daily  . mupirocin ointment  1 application Nasal BID  . MUSCLE RUB   Topical BID  . polyethylene glycol  17 g Oral Daily  . senna  1 tablet Oral BID  . sodium chloride flush  3 mL Intravenous Q12H  . Warfarin - Pharmacist Dosing Inpatient   Does not apply q1800   Continuous Infusions: . sodium chloride    . furosemide     PRN Meds:.sodium chloride, acetaminophen **OR** acetaminophen, benzonatate, HYDROcodone-acetaminophen, ipratropium-albuterol, nitroGLYCERIN, ondansetron **OR** ondansetron (ZOFRAN) IV, sodium chloride flush   PHYSICAL EXAM: Vital signs: Vitals:   12/05/17 0359 12/05/17 0400 12/05/17 0830 12/05/17 0853  BP:  (!) 111/54 132/65   Pulse:  62 62   Resp:  (!) 25 14 (!) 21  Temp:   97.9 F (36.6 C)   TempSrc:   Axillary   SpO2:  100% 99% 100%  Weight: 73.8 kg      Filed Weights   12/04/17 2147 12/05/17 0359  Weight: 74.4 kg 73.8 kg   Body mass index is 34 kg/m.   General appearance :Awake, alert, not in any distress.  On BiPAP Eyes:Pink conjunctiva HEENT: Atraumatic and Normocephalic Neck:  supple Resp:Good air entry bilaterally, bibasilar rales CVS: S1 S2 regular, no murmurs.  GI: Bowel sounds present, Non tender and not distended with no gaurding, rigidity or rebound.No organomegaly Extremities: B/L Lower Ext shows 1+ edema, both legs are warm to touch Neurology: Nonfocal Psychiatric: Normal judgment and insight. Alert and oriented x 3. Normal mood. Musculoskeletal:No digital cyanosis Skin:No Rash, warm and dry Wounds:N/A  I have personally reviewed following labs and imaging studies  LABORATORY DATA: CBC: Recent Labs  Lab 12/04/17 1037 12/05/17 0348  WBC 14.4* 8.5  NEUTROABS 12.7* 7.2  HGB 11.6* 10.0*  HCT 37.2 31.3*  MCV 102.5* 99.4  PLT 177 197    Basic Metabolic Panel: Recent Labs  Lab 12/04/17 1037 12/05/17 0348  NA 139 143  K 4.1 4.8  CL 103 101  CO2 23 26  GLUCOSE 107* 123*  BUN 33* 43*  CREATININE 2.08* 2.43*  CALCIUM 9.4 9.1    GFR: Estimated Creatinine Clearance: 15 mL/min (A) (by  C-G formula based on SCr of 2.43 mg/dL (H)).  Liver Function Tests: No results for input(s): AST, ALT, ALKPHOS, BILITOT, PROT, ALBUMIN in the last 168 hours. No results for input(s): LIPASE, AMYLASE in the last 168 hours. No results for input(s): AMMONIA in the last 168 hours.  Coagulation Profile: Recent Labs  Lab 12/04/17 1037 12/05/17 0348  INR 4.65* 3.59    Cardiac Enzymes: Recent Labs  Lab 12/04/17 1650 12/04/17 2158 12/05/17 0348  TROPONINI 0.18* 0.18* 0.15*    BNP (last 3 results) No results for input(s): PROBNP in the last 8760 hours.  HbA1C: No results for input(s): HGBA1C in the last 72 hours.  CBG: No results for input(s): GLUCAP in the last 168 hours.  Lipid Profile: No results for input(s): CHOL, HDL, LDLCALC, TRIG, CHOLHDL, LDLDIRECT in the last 72 hours.  Thyroid Function Tests: No results for input(s): TSH, T4TOTAL, FREET4, T3FREE, THYROIDAB in the last 72 hours.  Anemia Panel: No results for input(s): VITAMINB12,  FOLATE, FERRITIN, TIBC, IRON, RETICCTPCT in the last 72 hours.  Urine analysis:    Component Value Date/Time   COLORURINE YELLOW 07/15/2016 0914   APPEARANCEUR CLEAR 07/15/2016 0914   LABSPEC 1.009 07/15/2016 0914   PHURINE 5.0 07/15/2016 0914   GLUCOSEU NEGATIVE 07/15/2016 0914   HGBUR NEGATIVE 07/15/2016 0914   BILIRUBINUR NEGATIVE 07/15/2016 0914   KETONESUR NEGATIVE 07/15/2016 0914   PROTEINUR NEGATIVE 07/15/2016 0914   UROBILINOGEN 0.2 11/29/2014 1411   NITRITE NEGATIVE 07/15/2016 0914   LEUKOCYTESUR NEGATIVE 07/15/2016 0914    Sepsis Labs: Lactic Acid, Venous    Component Value Date/Time   LATICACIDVEN 1.56 11/07/2017 0402    MICROBIOLOGY: Recent Results (from the past 240 hour(s))  MRSA PCR Screening     Status: Abnormal   Collection Time: 12/04/17  4:24 PM  Result Value Ref Range Status   MRSA by PCR POSITIVE (A) NEGATIVE Final    Comment:        The GeneXpert MRSA Assay (FDA approved for NASAL specimens only), is one component of a comprehensive MRSA colonization surveillance program. It is not intended to diagnose MRSA infection nor to guide or monitor treatment for MRSA infections. CRITICAL RESULT CALLED TO, READ BACK BY AND VERIFIED WITH: Donnetta Hutching, RN AT 1805 ON 12/04/17 BY C. JESSUP, MLT. Performed at [facility], [address] [place], [place] [address]     RADIOLOGY STUDIES/RESULTS: Ct Thoracic Spine Wo Contrast  Result Date: 11/08/2017 CLINICAL DATA:  Chronic diffuse thoracic and lumbar pain. No injury. EXAM: CT THORACIC AND LUMBAR SPINE WITHOUT CONTRAST TECHNIQUE: Multidetector CT imaging of the thoracic and lumbar spine was performed without contrast. Multiplanar CT image reconstructions were also generated. COMPARISON:  Chest CT 11/03/2017 and 12/14/2014 as well as abdominopelvic CT 11/10/2012. FINDINGS: CT THORACIC SPINE FINDINGS Alignment: Subtle biphasic curvature with primary curvature convex right and secondary curvature convex left.  No subluxation. Vertebrae: Vertebral body heights are maintained. There is mild spondylosis throughout the thoracic spine. Minimal right-sided neural foraminal narrowing at the T3-4 level. Mild right-sided neural foraminal narrowing at the T9-10 level. Mild bilateral neural from narrowing at the T10-11 and T11-12 levels. Minimal canal stenosis at the T11-12 level due to posterior aspect formation and facet arthropathy. No evidence of compression fracture or subluxation. Paraspinal and other soft tissues: Paravertebral soft tissues are within normal. Calcified plaque over the thoracic aorta. Prosthetic aortic valve is present. Cardiomegaly. Mild bibasilar atelectasis and tiny amount right pleural fluid. Small hiatal hernia. Calcified plaque over  the abdominal aorta. Small cyst over the upper pole right kidney. Disc levels: Moderate disc space narrowing at the T3-4 and T5-6 levels with mild disc space narrowing throughout the remainder of the thoracic spine. CT LUMBAR SPINE FINDINGS Segmentation: 5 lumbar type vertebrae. Alignment: Slight curvature convex right. Vertebrae: Vertebral body heights are normal. There is moderate spondylosis throughout the lumbar spine. There is no compression fracture or spondylolisthesis. Prominent left paravertebral osteophyte at the L2-3 level. L1-2 level demonstrates mild broad-based disc bulge. Moderate facet arthropathy throughout the lumbar spine. L2-3 level demonstrates mild canal stenosis due to broad-based disc bulge, facet arthropathy and minimal ligamentum flavum hypertrophy. L3-4 level demonstrates mild broad-based disc bulge. There is mild narrowing of the canal in the transverse dimension. Mild bilateral neural foraminal narrowing. L4-5 level demonstrates mild broad-based disc bulge. Borderline canal stenosis. Severe bilateral neural foraminal narrowing right worse than left. L5-S1 level demonstrates broad-based disc bulge with minimal canal stenosis. Moderate to severe  bilateral neural foraminal narrowing. Paraspinal and other soft tissues: Moderate calcified plaque over the abdominal aorta. Small cyst over the upper pole and midpole right kidney. IVC filter is present. Diverticulosis of the colon. Disc levels: Moderate disc space narrowing at all levels of the lumbar spine with vacuum disc phenomenon. IMPRESSION: CT THORACIC SPINE IMPRESSION No acute findings. Mild spondylosis of the thoracic spine. Multilevel disc disease and multilevel neural foraminal narrowing as described. Mild biphasic curvature. Minimal canal stenosis at the T11-12 level. CT LUMBAR SPINE IMPRESSION No acute findings. Moderate spondylosis throughout the lumbar spine with moderate multilevel degenerative disc disease. Varying degrees of canal stenosis at several levels as described. Varying degrees of neural foraminal narrowing at multiple levels worse at the L4-5 and L5-S1 levels. Minimal curvature convex right. Other ancillary findings over the thorax and abdomen/pelvis as described including aortic atherosclerosis, right renal cysts and bibasilar atelectasis as well as small amount right pleural fluid. Electronically Signed   By: Elberta Fortis M.D.   On: 11/08/2017 11:47   Ct Lumbar Spine Wo Contrast  Result Date: 11/08/2017 CLINICAL DATA:  Chronic diffuse thoracic and lumbar pain. No injury. EXAM: CT THORACIC AND LUMBAR SPINE WITHOUT CONTRAST TECHNIQUE: Multidetector CT imaging of the thoracic and lumbar spine was performed without contrast. Multiplanar CT image reconstructions were also generated. COMPARISON:  Chest CT 11/03/2017 and 12/14/2014 as well as abdominopelvic CT 11/10/2012. FINDINGS: CT THORACIC SPINE FINDINGS Alignment: Subtle biphasic curvature with primary curvature convex right and secondary curvature convex left. No subluxation. Vertebrae: Vertebral body heights are maintained. There is mild spondylosis throughout the thoracic spine. Minimal right-sided neural foraminal narrowing at  the T3-4 level. Mild right-sided neural foraminal narrowing at the T9-10 level. Mild bilateral neural from narrowing at the T10-11 and T11-12 levels. Minimal canal stenosis at the T11-12 level due to posterior aspect formation and facet arthropathy. No evidence of compression fracture or subluxation. Paraspinal and other soft tissues: Paravertebral soft tissues are within normal. Calcified plaque over the thoracic aorta. Prosthetic aortic valve is present. Cardiomegaly. Mild bibasilar atelectasis and tiny amount right pleural fluid. Small hiatal hernia. Calcified plaque over the abdominal aorta. Small cyst over the upper pole right kidney. Disc levels: Moderate disc space narrowing at the T3-4 and T5-6 levels with mild disc space narrowing throughout the remainder of the thoracic spine. CT LUMBAR SPINE FINDINGS Segmentation: 5 lumbar type vertebrae. Alignment: Slight curvature convex right. Vertebrae: Vertebral body heights are normal. There is moderate spondylosis throughout the lumbar spine. There is no compression fracture or spondylolisthesis.  Prominent left paravertebral osteophyte at the L2-3 level. L1-2 level demonstrates mild broad-based disc bulge. Moderate facet arthropathy throughout the lumbar spine. L2-3 level demonstrates mild canal stenosis due to broad-based disc bulge, facet arthropathy and minimal ligamentum flavum hypertrophy. L3-4 level demonstrates mild broad-based disc bulge. There is mild narrowing of the canal in the transverse dimension. Mild bilateral neural foraminal narrowing. L4-5 level demonstrates mild broad-based disc bulge. Borderline canal stenosis. Severe bilateral neural foraminal narrowing right worse than left. L5-S1 level demonstrates broad-based disc bulge with minimal canal stenosis. Moderate to severe bilateral neural foraminal narrowing. Paraspinal and other soft tissues: Moderate calcified plaque over the abdominal aorta. Small cyst over the upper pole and midpole right  kidney. IVC filter is present. Diverticulosis of the colon. Disc levels: Moderate disc space narrowing at all levels of the lumbar spine with vacuum disc phenomenon. IMPRESSION: CT THORACIC SPINE IMPRESSION No acute findings. Mild spondylosis of the thoracic spine. Multilevel disc disease and multilevel neural foraminal narrowing as described. Mild biphasic curvature. Minimal canal stenosis at the T11-12 level. CT LUMBAR SPINE IMPRESSION No acute findings. Moderate spondylosis throughout the lumbar spine with moderate multilevel degenerative disc disease. Varying degrees of canal stenosis at several levels as described. Varying degrees of neural foraminal narrowing at multiple levels worse at the L4-5 and L5-S1 levels. Minimal curvature convex right. Other ancillary findings over the thorax and abdomen/pelvis as described including aortic atherosclerosis, right renal cysts and bibasilar atelectasis as well as small amount right pleural fluid. Electronically Signed   By: Elberta Fortis M.D.   On: 11/08/2017 11:47   Dg Chest Portable 1 View  Result Date: 12/04/2017 CLINICAL DATA:  Respiratory distress. EXAM: PORTABLE CHEST 1 VIEW COMPARISON:  Chest x-ray 11/07/2017. FINDINGS: Cardiac pacer with lead tip over the right atrium right ventricle. Prior cardiac valve replacement. Cardiomegaly. Diffuse bilateral pulmonary interstitial prominence and small bilateral pleural effusions. Findings consistent with CHF. IMPRESSION: 1. Cardiac pacer with lead tips over the right atrium and right ventricle. Prior cardiac valve replacement. 2. Findings suggesting congestive heart failure with bilateral pulmonary interstitial edema and bilateral pleural effusions. Electronically Signed   By: Maisie Fus  Register   On: 12/04/2017 10:56   Dg Chest Portable 1 View  Result Date: 11/07/2017 CLINICAL DATA:  84 year old female with respiratory distress. EXAM: PORTABLE CHEST 1 VIEW COMPARISON:  Chest CT dated 11/03/2017 FINDINGS: There is  cardiomegaly with vascular congestion and mild edema. Small left pleural effusion and left lung base atelectasis. Pneumonia is not excluded. There is no pneumothorax. Right pectoral pacemaker device and mechanical aortic valve. No acute osseous pathology. IMPRESSION: Cardiomegaly with findings of CHF and small left pleural effusion. Pneumonia is not excluded. Clinical correlation is recommended. Electronically Signed   By: Elgie Collard M.D.   On: 11/07/2017 02:32     LOS: 1 day   Jeoffrey Massed, MD  [facility]  If 7PM-7AM, please contact night-coverage  Please page via [email or web address]-Password [email or web address]-click on MD name and type text message  12/05/2017, 10:16 AM

## 2017-12-06 ENCOUNTER — Inpatient Hospital Stay (HOSPITAL_COMMUNITY): Payer: Medicare Other

## 2017-12-06 ENCOUNTER — Other Ambulatory Visit: Payer: Self-pay

## 2017-12-06 DIAGNOSIS — G43809 Other migraine, not intractable, without status migrainosus: Secondary | ICD-10-CM

## 2017-12-06 DIAGNOSIS — J189 Pneumonia, unspecified organism: Secondary | ICD-10-CM

## 2017-12-06 LAB — CBC
HCT: 31.3 % — ABNORMAL LOW (ref 36.0–46.0)
Hemoglobin: 10.1 g/dL — ABNORMAL LOW (ref 12.0–15.0)
MCH: 32 pg (ref 26.0–34.0)
MCHC: 32.3 g/dL (ref 30.0–36.0)
MCV: 99.1 fL (ref 78.0–100.0)
Platelets: 264 10*3/uL (ref 150–400)
RBC: 3.16 MIL/uL — ABNORMAL LOW (ref 3.87–5.11)
RDW: 14.7 % (ref 11.5–15.5)
WBC: 14.2 K/uL — ABNORMAL HIGH (ref 4.0–10.5)

## 2017-12-06 LAB — HEPATIC FUNCTION PANEL
ALT: 24 U/L (ref 0–44)
AST: 29 U/L (ref 15–41)
Albumin: 3 g/dL — ABNORMAL LOW (ref 3.5–5.0)
Alkaline Phosphatase: 48 U/L (ref 38–126)
BILIRUBIN INDIRECT: 0.8 mg/dL (ref 0.3–0.9)
Bilirubin, Direct: 0.2 mg/dL (ref 0.0–0.2)
TOTAL PROTEIN: 5.8 g/dL — AB (ref 6.5–8.1)
Total Bilirubin: 1 mg/dL (ref 0.3–1.2)

## 2017-12-06 LAB — BASIC METABOLIC PANEL WITH GFR
CO2: 32 mmol/L (ref 22–32)
Calcium: 9.6 mg/dL (ref 8.9–10.3)
Creatinine, Ser: 2.47 mg/dL — ABNORMAL HIGH (ref 0.44–1.00)

## 2017-12-06 LAB — BASIC METABOLIC PANEL
Anion gap: 11 (ref 5–15)
BUN: 53 mg/dL — ABNORMAL HIGH (ref 8–23)
Chloride: 100 mmol/L (ref 98–111)
GFR calc Af Amer: 20 mL/min — ABNORMAL LOW (ref >60)
GFR calc non Af Amer: 17 mL/min — ABNORMAL LOW (ref >60)
Glucose, Bld: 104 mg/dL — ABNORMAL HIGH (ref 70–99)
Potassium: 3.7 mmol/L (ref 3.5–5.1)
Sodium: 143 mmol/L (ref 135–145)

## 2017-12-06 LAB — PROTIME-INR
INR: 3.74
Prothrombin Time: 36.7 seconds — ABNORMAL HIGH (ref 11.4–15.2)

## 2017-12-06 LAB — TROPONIN I: Troponin I: 0.23 ng/mL (ref <0.03)

## 2017-12-06 LAB — PROCALCITONIN: PROCALCITONIN: 4.06 ng/mL

## 2017-12-06 MED ORDER — METOPROLOL TARTRATE 12.5 MG HALF TABLET
12.5000 mg | ORAL_TABLET | Freq: Two times a day (BID) | ORAL | Status: DC
  Administered 2017-12-06 – 2017-12-08 (×5): 12.5 mg via ORAL
  Filled 2017-12-06 (×5): qty 1

## 2017-12-06 MED ORDER — PANTOPRAZOLE SODIUM 40 MG PO TBEC
40.0000 mg | DELAYED_RELEASE_TABLET | Freq: Every day | ORAL | Status: DC
  Administered 2017-12-06 – 2017-12-08 (×3): 40 mg via ORAL
  Filled 2017-12-06 (×3): qty 1

## 2017-12-06 MED ORDER — METOPROLOL TARTRATE 5 MG/5ML IV SOLN
5.0000 mg | Freq: Once | INTRAVENOUS | Status: AC | PRN
  Administered 2017-12-06: 5 mg via INTRAVENOUS
  Filled 2017-12-06: qty 5

## 2017-12-06 MED ORDER — VALPROATE SODIUM 500 MG/5ML IV SOLN
500.0000 mg | Freq: Once | INTRAVENOUS | Status: AC
  Administered 2017-12-06: 500 mg via INTRAVENOUS
  Filled 2017-12-06: qty 5

## 2017-12-06 MED ORDER — SODIUM CHLORIDE 0.9 % IV SOLN
1.0000 g | INTRAVENOUS | Status: DC
  Administered 2017-12-06 – 2017-12-08 (×3): 1 g via INTRAVENOUS
  Filled 2017-12-06 (×3): qty 1

## 2017-12-06 NOTE — Progress Notes (Signed)
This note also relates to the following rows which could not be included: Resp - Cannot attach notes to unvalidated device data SpO2 - Cannot attach notes to unvalidated device data  Pt in no distress at this time.  Pt does not wear cpap at home.  RT left bipap in room at this time in case the need arises for her to wear (RN is aware as well). RT will monitor.

## 2017-12-06 NOTE — Progress Notes (Signed)
Pt still complained of headache after receiving Tramadol.  Hospitalist was paged a second time and ordered a one time oral Imitrex 50 mg. Medicine was given.  Will continue to monitor.  Harriet Masson, RN

## 2017-12-06 NOTE — Progress Notes (Signed)
ANTICOAGULATION CONSULT NOTE   Pharmacy Consult for warfarin Indication: TAVR/AFib  Allergies  Allergen Reactions  . Nubain [Nalbuphine Hcl] Hives    Went into cardiac arrest   . Codeine Hives and Nausea Only    Has tolerated Oxycodone  . Darvocet [Propoxyphene N-Acetaminophen] Nausea Only     Vital Signs: Temp: 98.3 F (36.8 C) (08/18 0906) Temp Source: Oral (08/18 0906) BP: 148/74 (08/18 0906) Pulse Rate: 85 (08/18 0906)  Labs: Recent Labs    12/04/17 1037  12/04/17 2158 12/05/17 0348 12/06/17 0212 12/06/17 0845  HGB 11.6*  --   --  10.0* 10.1*  --   HCT 37.2  --   --  31.3* 31.3*  --   PLT 177  --   --  197 264  --   LABPROT 43.5*  --   --  35.5* 36.7*  --   INR 4.65*  --   --  3.59 3.74  --   CREATININE 2.08*  --   --  2.43* 2.47*  --   TROPONINI  --    < > 0.18* 0.15*  --  0.23*   < > = values in this interval not displayed.    Estimated Creatinine Clearance: 14.6 mL/min (A) (by C-G formula based on SCr of 2.47 mg/dL (H)).   Medical History: Past Medical History:  Diagnosis Date  . Acute on chronic renal failure [facility])    sees Dr Allena Katz   . Anemia    Acute blood loss  . Aortic regurgitation   . Aortic stenosis 10/13/2012   Low EF, low gradient with severe aortic stenosis confirmed by dobutamine stress echocardiogram s/p TAVR 12/2012  . Asthma   . Atrial fibrillation (HCC)    tachy-brady syndrome with <1% recurrent PAF since pacemaker placement  . Cataracts, bilateral   . Chronic combined systolic and diastolic CHF (congestive heart failure) (HCC)   . Chronic lower back pain   . CKD (chronic kidney disease)   . Coronary artery disease involving native coronary artery of native heart   . Dementia    Without behavioral disturbance  . Dysrhythmia   . Fibromyalgia   . Gastroesophageal reflux disease   . H/O dizziness   . H/O urinary frequency   . H/O: stroke   . Hard of hearing   . Headache    hx of migraines   . Heart murmur   . History of blood  transfusion   . History of bronchitis   . History of kidney stones   . History of urinary tract infection   . HLD (hyperlipidemia)   . HTN (hypertension)   . Hypothyroidism   . Neuropathy   . Nonischemic cardiomyopathy (HCC)   . On home oxygen therapy    patient uses at nite- 2L- has not used in > 6 months per patient  . Osteoarthritis   . Osteoporosis   . Pneumonia    hx of x 3   . PONV (postoperative nausea and vomiting)   . Presence of permanent cardiac pacemaker   . Pulmonary embolism (HCC)    HISTORY OF, the pt. had a recurrent bilateral pulmonary emboli in 2005, on warfarin therapy and at which time she under went implantation of IVC filter  . Repeated falls   . Rupture of right patellar tendon   . Sciatica   . Shortness of breath dyspnea    with exertion   . Sleep apnea    uses oxygen at night and PRN- not  used since > 6 months / DOES NOT USE  C-PAP  . Spinal stenosis   . Stroke (HCC)   . Symptomatic bradycardia   . Symptomatic bradycardia 2012   s/p [employer] PPM  . Syncope   . Urinary incontinence   . Urinary urgency    Assessment:  70 yoF on warfarin PTA for TAVR and Afib . Last dose PTA 8/15. INR on admit 4.65. PTA regimen per nursing home is 4 mg daily.   INR today increased from 3.59 to 3.74. Hgb 10.1, plt 264. Trop increased to 0.23. On amiodarone (on PTA). Starting cefepime for HCAP - can impact INR sensitivity. No s/sx of bleeding noted.   Goal of Therapy:  INR 2-3 Monitor platelets by anticoagulation protocol: Yes   Plan:  Continue to hold warfarin Daily INR/CBC Monitor for s/sx of bleeding  Girard Cooter, PharmD Clinical Pharmacist  Pager: [phone] Phone: [phone] 12/06/2017 10:41 AM Please check AMION for all [facility] numbers

## 2017-12-06 NOTE — Progress Notes (Signed)
Pharmacist Heart Failure Core Measure Documentation  Assessment: [name] has an EF documented as 25-30% on ECHO by 11/08/2017.  Rationale: Heart failure patients with left ventricular systolic dysfunction (LVSD) and an EF < 40% should be prescribed an angiotensin converting enzyme inhibitor (ACEI) or angiotensin receptor blocker (ARB) at discharge unless a contraindication is documented in the medical record.  This patient is not currently on an ACEI or ARB for HF.  This note is being placed in the record in order to provide documentation that a contraindication to the use of these agents is present for this encounter.  ACE Inhibitor or Angiotensin Receptor Blocker is contraindicated (specify all that apply)  []   ACEI allergy AND ARB allergy []   Angioedema []   Moderate or severe aortic stenosis []   Hyperkalemia []   Hypotension []   Renal artery stenosis [x]   Worsening renal function, preexisting renal disease or dysfunction  Continue to monitor patient's renal function and vitals to determine if can start therapy in future.   Girard Cooter, PharmD Clinical Pharmacist  Pager: [phone] Phone: [phone]  12/06/2017 2:35 PM

## 2017-12-06 NOTE — Progress Notes (Signed)
Pharmacy Antibiotic Note  [name] is a 84 y.o. female admitted on 12/04/2017 with pneumonia.  Pharmacy has been consulted for cefepime dosing.  CXR showing L base opacification worsen (likely small effusion) and bilateral perihilar markings - infection is possible. WBC up to 14.2. PCT 4.06. Scr increased to 2.47. Afebrile. Has reported productive cough.   Plan: Cefepime 1 g IV every 24 hours  Monitor renal fx, clinical pic, cx results, and LOT  Weight: 160 lb 0.9 oz (72.6 kg)  Temp (24hrs), Avg:98.4 F (36.9 C), Min:98.2 F (36.8 C), Max:98.9 F (37.2 C)  Recent Labs  Lab 12/04/17 1037 12/05/17 0348 12/06/17 0212  WBC 14.4* 8.5 14.2*  CREATININE 2.08* 2.43* 2.47*    Estimated Creatinine Clearance: 14.6 mL/min (A) (by C-G formula based on SCr of 2.47 mg/dL (H)).    Allergies  Allergen Reactions  . Nubain [Nalbuphine Hcl] Hives    Went into cardiac arrest   . Codeine Hives and Nausea Only    Has tolerated Oxycodone  . Darvocet [Propoxyphene N-Acetaminophen] Nausea Only    Antimicrobials this admission: Cefepime 8/18 >>   Dose adjustments this admission: N/A  Microbiology results: 8/16 MRSA PCR: positive  Thank you for allowing pharmacy to be a part of this patient's care.  Girard Cooter, PharmD Clinical Pharmacist  Pager: [phone] Phone: [phone] 12/06/2017 10:43 AM

## 2017-12-06 NOTE — Progress Notes (Signed)
Pt was complaining of a very bad headache even after Vicodin was given over an hour earlier. On call hospitalist was informed and they ordered a one time dose of oral Tramadol 50 mg.  Pain med has been given.  Will continue to monitor.  Harriet Masson, RN

## 2017-12-06 NOTE — Progress Notes (Signed)
PROGRESS NOTE        PATIENT DETAILS Name: [name] Age: 84 y.o. Sex: female Date of Birth: [birth year] Admit Date: 12/04/2017 Admitting Physician Delaine Lame, MD PCP:Kim, Fayrene Fearing, MD  Brief Narrative: Patient is a 84 y.o. female with history of chronic systolic heart failure, stage IV CKD, dementia, severe aortic stenosis status post TAVR 2014, PAF on warfarin, symptomatic bradycardia requiring permanent pacemaker implantation, chronic hypoxic respiratory failure on home O2 2.5 L/min,-presented with sudden onset of shortness of breath when she woke up on 8/16, she was found to be hypoxic and promptly transferred to the emergency room.  She was started on BiPAP, she was thought to have decompensated heart failure, given Lasix and admitted to the hospitalist service.  See below for further details.  Subjective: Liberated off BiPAP-says breathing is better than yesterday. Main issues this morning is headache (reminiscent of her  migraine headaches that she had years ago) and left sided chest pain.  Assessment/Plan: Acute on chronic hypoxic respiratory failure likely secondary to acute decompensated systolic heart failure (EF 25-30% by TTE on 11/08/2017) vs HCAP:her symptomatology of sudden onset of shortness of breath, along with elevated BNP and chest x-ray findings are suggestive of acute pulmonary edema.  After starting high-dose IV diuretics (120 mg twice daily) shortness of breath has improved, and she was able to be liberated of BiPAP.  However-chest x-ray on 8/18 appear to be worse (independently reviewed)-she has more left base opacification, and also more prominence in the right hilar area-with worsening leukocytosis, and significantly elevated procalcitonin (worsening than on admission)-we will go and cover with IV cefepime in case she has pneumonia.  Her volume status is stable-we will hold diuretics until seen by cardiology today.  Since, still not clear  as to whether this is CHF or pneumonia-we will go ahead and obtain a CT scan of her chest without contrast.  Note-she is on home O2 2.5 L/min at all times.  Elevated troponins: Did have some chest pain this morning that was relieved by nitroglycerin, I suspect this is demand ischemia, EKG continues to show a left bundle.  She apparently had a stress test in 2018 that did not show any ischemia.  Given her frailty, CKD stage IV, she has been managed medically.  Will await further input from cardiology.    Acute kidney injury on CKD stage IV: Despite hemodynamically mediated-given Lasix (120 mg twice daily) on 8/17, -1.7 L so far-her volume status is stable.  Hold diuretics today-creatinine although slightly elevated but not very far from a baseline.  Continue to follow renal function-some suspicion for hemodynamically mediated kidney injury and probably some amount of cardiorenal syndrome as well.  She does follow with Dr. Allena Katz (nephrology) in the outpatient setting.  PAF: Did have a few episodes of RVR overnight-currently rate controlled-continue amiodarone, INR slightly supratherapeutic-Coumadin is being managed by pharmacy team.  Will add low-dose beta-blocker.  History of symptomatic bradycardia requiring pacemaker implantation: Monitor on telemetry.  Migraine headache: On 8/18 morning-patient complained of throbbing headache in the frontal area, involving eyes-with phonophobia and photophobia.  She apparently does have a history of migraine headaches, but has not had a headache in the past few years.  Her current headache is very reminiscent of her usual migraine headaches-given 1 dose of IV Depacon this morning with significant relief.  Follow for now-given  significant cardiac issues-probably a good idea to avoid triptan's.  History of severe aortic stenosis requiring TAVR 2014  Dyslipidemia: Continue statin  Hypothyroidism: Continue Synthroid  Dementia: Continue Aricept  OSA: CPAP  nightly  DVT Prophylaxis: Full dose anticoagulation with Coumadin  Code Status: Full code   Family Communication: None at bedside  Disposition Plan: Remain inpatient-back to SNF over the next few days.  Antimicrobial agents: Anti-infectives (From admission, onward)   None      Procedures: None  CONSULTS:  cardiology  Time spent: 35 minutes-Greater than 50% of this time was spent in counseling, explanation of diagnosis, planning of further management, and coordination of care.  MEDICATIONS: Scheduled Meds: . allopurinol  100 mg Oral Daily  . amiodarone  100 mg Oral Daily  . atorvastatin  10 mg Oral q1800  . calcitRIOL  0.25 mcg Oral Daily  . Chlorhexidine Gluconate Cloth  6 each Topical Q0600  . cholecalciferol  2,000 Units Oral QHS  . cycloSPORINE  1 drop Both Eyes BID  . docusate sodium  100 mg Oral BID  . donepezil  10 mg Oral QHS  . DULoxetine  60 mg Oral q1800  . ferrous sulfate  325 mg Oral Daily  . gabapentin  200 mg Oral BID  . isosorbide mononitrate  30 mg Oral Daily  . levothyroxine  100 mcg Oral QAC breakfast  . lidocaine  1 patch Transdermal Q24H  . linaclotide  290 mcg Oral Daily  . multivitamin  1 tablet Oral Daily  . mupirocin ointment  1 application Nasal BID  . MUSCLE RUB   Topical BID  . pantoprazole  40 mg Oral Q1200  . polyethylene glycol  17 g Oral Daily  . senna  1 tablet Oral BID  . sodium chloride flush  3 mL Intravenous Q12H  . Warfarin - Pharmacist Dosing Inpatient   Does not apply q1800   Continuous Infusions: . sodium chloride    . furosemide Stopped (12/05/17 1900)  . valproate sodium     PRN Meds:.sodium chloride, acetaminophen **OR** acetaminophen, benzonatate, HYDROcodone-acetaminophen, ipratropium-albuterol, nitroGLYCERIN, ondansetron **OR** ondansetron (ZOFRAN) IV, sodium chloride flush   PHYSICAL EXAM: Vital signs: Vitals:   12/05/17 1726 12/05/17 1948 12/05/17 2341 12/06/17 0411  BP: (!) 122/57 (!) 148/77 (!)  150/95 128/68  Pulse: 98 94 100   Resp: 17 16 (!) 21 16  Temp: 98.9 F (37.2 C) 98.4 F (36.9 C) 98.2 F (36.8 C) 98.2 F (36.8 C)  TempSrc: Oral Oral Oral Oral  SpO2: 98% 99% 96% 98%  Weight:    72.6 kg   Filed Weights   12/04/17 2147 12/05/17 0359 12/06/17 0411  Weight: 74.4 kg 73.8 kg 72.6 kg   Body mass index is 33.45 kg/m.   General appearance:Awake, alert, not in any distress.  Eyes:no scleral icterus. HEENT: Atraumatic and Normocephalic Neck: supple, no JVD. Resp:Good air entry bilaterally, few bibasilar rales CVS: S1 S2 regular GI: Bowel sounds present, Non tender and not distended with no gaurding, rigidity or rebound. Extremities: B/L Lower Ext shows no edema, both legs are warm to touch Neurology:  Non focal Psychiatric: Normal judgment and insight. Normal mood. Musculoskeletal:No digital cyanosis Skin:No Rash, warm and dry Wounds:N/A  I have personally reviewed following labs and imaging studies  LABORATORY DATA: CBC: Recent Labs  Lab 12/04/17 1037 12/05/17 0348 12/06/17 0212  WBC 14.4* 8.5 14.2*  NEUTROABS 12.7* 7.2  --   HGB 11.6* 10.0* 10.1*  HCT 37.2 31.3* 31.3*  MCV 102.5* 99.4  99.1  PLT 177 197 264    Basic Metabolic Panel: Recent Labs  Lab 12/04/17 1037 12/05/17 0348 12/06/17 0212  NA 139 143 143  K 4.1 4.8 3.7  CL 103 101 100  CO2 23 26 32  GLUCOSE 107* 123* 104*  BUN 33* 43* 53*  CREATININE 2.08* 2.43* 2.47*  CALCIUM 9.4 9.1 9.6    GFR: Estimated Creatinine Clearance: 14.6 mL/min (A) (by C-G formula based on SCr of 2.47 mg/dL (H)).  Liver Function Tests: No results for input(s): AST, ALT, ALKPHOS, BILITOT, PROT, ALBUMIN in the last 168 hours. No results for input(s): LIPASE, AMYLASE in the last 168 hours. No results for input(s): AMMONIA in the last 168 hours.  Coagulation Profile: Recent Labs  Lab 12/04/17 1037 12/05/17 0348 12/06/17 0212  INR 4.65* 3.59 3.74    Cardiac Enzymes: Recent Labs  Lab 12/04/17 1650  12/04/17 2158 12/05/17 0348  TROPONINI 0.18* 0.18* 0.15*    BNP (last 3 results) No results for input(s): PROBNP in the last 8760 hours.  HbA1C: No results for input(s): HGBA1C in the last 72 hours.  CBG: No results for input(s): GLUCAP in the last 168 hours.  Lipid Profile: No results for input(s): CHOL, HDL, LDLCALC, TRIG, CHOLHDL, LDLDIRECT in the last 72 hours.  Thyroid Function Tests: No results for input(s): TSH, T4TOTAL, FREET4, T3FREE, THYROIDAB in the last 72 hours.  Anemia Panel: No results for input(s): VITAMINB12, FOLATE, FERRITIN, TIBC, IRON, RETICCTPCT in the last 72 hours.  Urine analysis:    Component Value Date/Time   COLORURINE YELLOW 07/15/2016 0914   APPEARANCEUR CLEAR 07/15/2016 0914   LABSPEC 1.009 07/15/2016 0914   PHURINE 5.0 07/15/2016 0914   GLUCOSEU NEGATIVE 07/15/2016 0914   HGBUR NEGATIVE 07/15/2016 0914   BILIRUBINUR NEGATIVE 07/15/2016 0914   KETONESUR NEGATIVE 07/15/2016 0914   PROTEINUR NEGATIVE 07/15/2016 0914   UROBILINOGEN 0.2 11/29/2014 1411   NITRITE NEGATIVE 07/15/2016 0914   LEUKOCYTESUR NEGATIVE 07/15/2016 0914    Sepsis Labs: Lactic Acid, Venous    Component Value Date/Time   LATICACIDVEN 1.56 11/07/2017 0402    MICROBIOLOGY: Recent Results (from the past 240 hour(s))  MRSA PCR Screening     Status: Abnormal   Collection Time: 12/04/17  4:24 PM  Result Value Ref Range Status   MRSA by PCR POSITIVE (A) NEGATIVE Final    Comment:        The GeneXpert MRSA Assay (FDA approved for NASAL specimens only), is one component of a comprehensive MRSA colonization surveillance program. It is not intended to diagnose MRSA infection nor to guide or monitor treatment for MRSA infections. CRITICAL RESULT CALLED TO, READ BACK BY AND VERIFIED WITH: Donnetta Hutching, RN AT 1805 ON 12/04/17 BY C. JESSUP, MLT. Performed at [facility], [address] [place], [place] [address]     RADIOLOGY STUDIES/RESULTS: Ct Thoracic Spine  Wo Contrast  Result Date: 11/08/2017 CLINICAL DATA:  Chronic diffuse thoracic and lumbar pain. No injury. EXAM: CT THORACIC AND LUMBAR SPINE WITHOUT CONTRAST TECHNIQUE: Multidetector CT imaging of the thoracic and lumbar spine was performed without contrast. Multiplanar CT image reconstructions were also generated. COMPARISON:  Chest CT 11/03/2017 and 12/14/2014 as well as abdominopelvic CT 11/10/2012. FINDINGS: CT THORACIC SPINE FINDINGS Alignment: Subtle biphasic curvature with primary curvature convex right and secondary curvature convex left. No subluxation. Vertebrae: Vertebral body heights are maintained. There is mild spondylosis throughout the thoracic spine. Minimal right-sided neural foraminal narrowing at the T3-4 level. Mild right-sided neural foraminal  narrowing at the T9-10 level. Mild bilateral neural from narrowing at the T10-11 and T11-12 levels. Minimal canal stenosis at the T11-12 level due to posterior aspect formation and facet arthropathy. No evidence of compression fracture or subluxation. Paraspinal and other soft tissues: Paravertebral soft tissues are within normal. Calcified plaque over the thoracic aorta. Prosthetic aortic valve is present. Cardiomegaly. Mild bibasilar atelectasis and tiny amount right pleural fluid. Small hiatal hernia. Calcified plaque over the abdominal aorta. Small cyst over the upper pole right kidney. Disc levels: Moderate disc space narrowing at the T3-4 and T5-6 levels with mild disc space narrowing throughout the remainder of the thoracic spine. CT LUMBAR SPINE FINDINGS Segmentation: 5 lumbar type vertebrae. Alignment: Slight curvature convex right. Vertebrae: Vertebral body heights are normal. There is moderate spondylosis throughout the lumbar spine. There is no compression fracture or spondylolisthesis. Prominent left paravertebral osteophyte at the L2-3 level. L1-2 level demonstrates mild broad-based disc bulge. Moderate facet arthropathy throughout the  lumbar spine. L2-3 level demonstrates mild canal stenosis due to broad-based disc bulge, facet arthropathy and minimal ligamentum flavum hypertrophy. L3-4 level demonstrates mild broad-based disc bulge. There is mild narrowing of the canal in the transverse dimension. Mild bilateral neural foraminal narrowing. L4-5 level demonstrates mild broad-based disc bulge. Borderline canal stenosis. Severe bilateral neural foraminal narrowing right worse than left. L5-S1 level demonstrates broad-based disc bulge with minimal canal stenosis. Moderate to severe bilateral neural foraminal narrowing. Paraspinal and other soft tissues: Moderate calcified plaque over the abdominal aorta. Small cyst over the upper pole and midpole right kidney. IVC filter is present. Diverticulosis of the colon. Disc levels: Moderate disc space narrowing at all levels of the lumbar spine with vacuum disc phenomenon. IMPRESSION: CT THORACIC SPINE IMPRESSION No acute findings. Mild spondylosis of the thoracic spine. Multilevel disc disease and multilevel neural foraminal narrowing as described. Mild biphasic curvature. Minimal canal stenosis at the T11-12 level. CT LUMBAR SPINE IMPRESSION No acute findings. Moderate spondylosis throughout the lumbar spine with moderate multilevel degenerative disc disease. Varying degrees of canal stenosis at several levels as described. Varying degrees of neural foraminal narrowing at multiple levels worse at the L4-5 and L5-S1 levels. Minimal curvature convex right. Other ancillary findings over the thorax and abdomen/pelvis as described including aortic atherosclerosis, right renal cysts and bibasilar atelectasis as well as small amount right pleural fluid. Electronically Signed   By: Elberta Fortis M.D.   On: 11/08/2017 11:47   Ct Lumbar Spine Wo Contrast  Result Date: 11/08/2017 CLINICAL DATA:  Chronic diffuse thoracic and lumbar pain. No injury. EXAM: CT THORACIC AND LUMBAR SPINE WITHOUT CONTRAST TECHNIQUE:  Multidetector CT imaging of the thoracic and lumbar spine was performed without contrast. Multiplanar CT image reconstructions were also generated. COMPARISON:  Chest CT 11/03/2017 and 12/14/2014 as well as abdominopelvic CT 11/10/2012. FINDINGS: CT THORACIC SPINE FINDINGS Alignment: Subtle biphasic curvature with primary curvature convex right and secondary curvature convex left. No subluxation. Vertebrae: Vertebral body heights are maintained. There is mild spondylosis throughout the thoracic spine. Minimal right-sided neural foraminal narrowing at the T3-4 level. Mild right-sided neural foraminal narrowing at the T9-10 level. Mild bilateral neural from narrowing at the T10-11 and T11-12 levels. Minimal canal stenosis at the T11-12 level due to posterior aspect formation and facet arthropathy. No evidence of compression fracture or subluxation. Paraspinal and other soft tissues: Paravertebral soft tissues are within normal. Calcified plaque over the thoracic aorta. Prosthetic aortic valve is present. Cardiomegaly. Mild bibasilar atelectasis and tiny amount right pleural fluid.  Small hiatal hernia. Calcified plaque over the abdominal aorta. Small cyst over the upper pole right kidney. Disc levels: Moderate disc space narrowing at the T3-4 and T5-6 levels with mild disc space narrowing throughout the remainder of the thoracic spine. CT LUMBAR SPINE FINDINGS Segmentation: 5 lumbar type vertebrae. Alignment: Slight curvature convex right. Vertebrae: Vertebral body heights are normal. There is moderate spondylosis throughout the lumbar spine. There is no compression fracture or spondylolisthesis. Prominent left paravertebral osteophyte at the L2-3 level. L1-2 level demonstrates mild broad-based disc bulge. Moderate facet arthropathy throughout the lumbar spine. L2-3 level demonstrates mild canal stenosis due to broad-based disc bulge, facet arthropathy and minimal ligamentum flavum hypertrophy. L3-4 level demonstrates  mild broad-based disc bulge. There is mild narrowing of the canal in the transverse dimension. Mild bilateral neural foraminal narrowing. L4-5 level demonstrates mild broad-based disc bulge. Borderline canal stenosis. Severe bilateral neural foraminal narrowing right worse than left. L5-S1 level demonstrates broad-based disc bulge with minimal canal stenosis. Moderate to severe bilateral neural foraminal narrowing. Paraspinal and other soft tissues: Moderate calcified plaque over the abdominal aorta. Small cyst over the upper pole and midpole right kidney. IVC filter is present. Diverticulosis of the colon. Disc levels: Moderate disc space narrowing at all levels of the lumbar spine with vacuum disc phenomenon. IMPRESSION: CT THORACIC SPINE IMPRESSION No acute findings. Mild spondylosis of the thoracic spine. Multilevel disc disease and multilevel neural foraminal narrowing as described. Mild biphasic curvature. Minimal canal stenosis at the T11-12 level. CT LUMBAR SPINE IMPRESSION No acute findings. Moderate spondylosis throughout the lumbar spine with moderate multilevel degenerative disc disease. Varying degrees of canal stenosis at several levels as described. Varying degrees of neural foraminal narrowing at multiple levels worse at the L4-5 and L5-S1 levels. Minimal curvature convex right. Other ancillary findings over the thorax and abdomen/pelvis as described including aortic atherosclerosis, right renal cysts and bibasilar atelectasis as well as small amount right pleural fluid. Electronically Signed   By: Elberta Fortis M.D.   On: 11/08/2017 11:47   Dg Chest Port 1 View  Result Date: 12/06/2017 CLINICAL DATA:  Shortness of breath. EXAM: PORTABLE CHEST 1 VIEW COMPARISON:  12/04/2017 FINDINGS: Right-sided pacemaker unchanged. Evidence of previous aortic valve replacement unchanged. Lungs are hypoinflated demonstrate left basilar opacification likely effusion with atelectasis although infection in the left  base is possible. There is interval worsening of the perihilar markings right worse than left compatible with mild asymmetric interstitial edema versus infection. Mild stable cardiomegaly. Remainder of the exam is unchanged. IMPRESSION: Left base opacification slightly worse likely small effusion with atelectasis, although infection is possible. Prominent bilateral perihilar markings right worse than left which may be due to asymmetric edema versus infection. Mild stable cardiomegaly. Electronically Signed   By: Elberta Fortis M.D.   On: 12/06/2017 08:01   Dg Chest Portable 1 View  Result Date: 12/04/2017 CLINICAL DATA:  Respiratory distress. EXAM: PORTABLE CHEST 1 VIEW COMPARISON:  Chest x-ray 11/07/2017. FINDINGS: Cardiac pacer with lead tip over the right atrium right ventricle. Prior cardiac valve replacement. Cardiomegaly. Diffuse bilateral pulmonary interstitial prominence and small bilateral pleural effusions. Findings consistent with CHF. IMPRESSION: 1. Cardiac pacer with lead tips over the right atrium and right ventricle. Prior cardiac valve replacement. 2. Findings suggesting congestive heart failure with bilateral pulmonary interstitial edema and bilateral pleural effusions. Electronically Signed   By: Maisie Fus  Register   On: 12/04/2017 10:56   Dg Chest Portable 1 View  Result Date: 11/07/2017 CLINICAL DATA:  84 year old female  with respiratory distress. EXAM: PORTABLE CHEST 1 VIEW COMPARISON:  Chest CT dated 11/03/2017 FINDINGS: There is cardiomegaly with vascular congestion and mild edema. Small left pleural effusion and left lung base atelectasis. Pneumonia is not excluded. There is no pneumothorax. Right pectoral pacemaker device and mechanical aortic valve. No acute osseous pathology. IMPRESSION: Cardiomegaly with findings of CHF and small left pleural effusion. Pneumonia is not excluded. Clinical correlation is recommended. Electronically Signed   By: Elgie Collard M.D.   On: 11/07/2017  02:32     LOS: 2 days   Jeoffrey Massed, MD  [facility]  If 7PM-7AM, please contact night-coverage  Please page via [email or web address]-Password [email or web address]-click on MD name and type text message  12/06/2017, 8:26 AM

## 2017-12-06 NOTE — Progress Notes (Signed)
After moving around in the bed, the pt convert into A. Fib, which she has a history of.  HR mostly went from 110s - 120s and occasionally going to 130s and 140s.  There were no PRN orders for high heart rate.  Hospitalist was paged and they ordered a one time PRN IV Metoprolol 5 mg for sustained heart rated greater than 120.  At least 30 minutes after the Metoprolol was given, the pt converted back to NSR.  Will continue to monitor pt.  Harriet Masson, RN

## 2017-12-07 ENCOUNTER — Telehealth: Payer: Self-pay | Admitting: Pulmonary Disease

## 2017-12-07 LAB — BASIC METABOLIC PANEL
Anion gap: 16 — ABNORMAL HIGH (ref 5–15)
BUN: 57 mg/dL — ABNORMAL HIGH (ref 8–23)
Chloride: 96 mmol/L — ABNORMAL LOW (ref 98–111)
Glucose, Bld: 99 mg/dL (ref 70–99)
Potassium: 4 mmol/L (ref 3.5–5.1)
Sodium: 139 mmol/L (ref 135–145)

## 2017-12-07 LAB — CBC
HCT: 33 % — ABNORMAL LOW (ref 36.0–46.0)
Hemoglobin: 10.5 g/dL — ABNORMAL LOW (ref 12.0–15.0)
MCH: 31.8 pg (ref 26.0–34.0)
MCHC: 31.8 g/dL (ref 30.0–36.0)
MCV: 100 fL (ref 78.0–100.0)
Platelets: 245 K/uL (ref 150–400)
RBC: 3.3 MIL/uL — ABNORMAL LOW (ref 3.87–5.11)
RDW: 14.2 % (ref 11.5–15.5)
WBC: 10.6 10*3/uL — ABNORMAL HIGH (ref 4.0–10.5)

## 2017-12-07 LAB — PROTIME-INR
INR: 4.37
Prothrombin Time: 41.4 seconds — ABNORMAL HIGH (ref 11.4–15.2)

## 2017-12-07 LAB — BASIC METABOLIC PANEL WITH GFR
CO2: 27 mmol/L (ref 22–32)
Calcium: 9.3 mg/dL (ref 8.9–10.3)
Creatinine, Ser: 2.42 mg/dL — ABNORMAL HIGH (ref 0.44–1.00)
GFR calc Af Amer: 20 mL/min — ABNORMAL LOW (ref >60)
GFR calc non Af Amer: 17 mL/min — ABNORMAL LOW (ref >60)

## 2017-12-07 MED ORDER — SODIUM CHLORIDE 0.9 % IV SOLN
500.0000 mg | Freq: Every day | INTRAVENOUS | Status: DC
  Administered 2017-12-07 – 2017-12-08 (×2): 500 mg via INTRAVENOUS
  Filled 2017-12-07 (×2): qty 500

## 2017-12-07 MED ORDER — GUAIFENESIN ER 600 MG PO TB12
1200.0000 mg | ORAL_TABLET | Freq: Two times a day (BID) | ORAL | Status: DC | PRN
Start: 2017-12-07 — End: 2017-12-08
  Administered 2017-12-07 (×2): 1200 mg via ORAL
  Filled 2017-12-07 (×2): qty 2

## 2017-12-07 NOTE — Evaluation (Signed)
Physical Therapy Evaluation Patient Details Name: [name] MRN: [id number] DOB: 01/27/1933 Today's Date: 12/07/2017   History of Present Illness  Patient is a 84 y.o. female with history of chronic systolic heart failure, stage IV CKD, dementia, severe aortic stenosis status post TAVR 2014, PAF on warfarin, symptomatic bradycardia requiring permanent pacemaker implantation, chronic hypoxic respiratory failure on home O2 2.5 L/min,-presented with sudden onset of shortness of breath when she woke up on 8/16, she was found to be hypoxic and promptly transferred to the emergency room.  She was started on BiPAP, she was thought to have decompensated heart failure, given Lasix and admitted to the hospitalist service.  Clinical Impression  Pt admitted with above diagnosis. Pt currently with functional limitations due to the deficits listed below (see PT Problem List). Pt was able to sit EOB for 10 min and perform LE exercises.  Pt weak but could improve some according to baseline.  Will follow acutely.   Pt will benefit from skilled PT to increase their independence and safety with mobility to allow discharge to the venue listed below.      Follow Up Recommendations SNF;Supervision/Assistance - 24 hour    Equipment Recommendations  None recommended by PT    Recommendations for Other Services       Precautions / Restrictions Precautions Precautions: Fall Required Braces or Orthoses: Other Brace/Splint(right LE brace) Restrictions Weight Bearing Restrictions: No      Mobility  Bed Mobility Overal bed mobility: Needs Assistance Bed Mobility: Supine to Sit     Supine to sit: Min assist     General bed mobility comments: assist for LEs and trunk to come to EOB  Transfers                 General transfer comment: NT  Ambulation/Gait                Stairs            Wheelchair Mobility    Modified Rankin (Stroke Patients Only)       Balance Overall  balance assessment: Needs assistance Sitting-balance support: Bilateral upper extremity supported;Feet supported Sitting balance-Leahy Scale: Poor Sitting balance - Comments: Leaning posteriorly especially with challenges.  Sat 10 min EOB with varying min guard to min asssit.  Postural control: Posterior lean                                   Pertinent Vitals/Pain Pain Assessment: Faces Faces Pain Scale: Hurts little more Pain Location: did not state just grimaced with movement to EOB Pain Descriptors / Indicators: Discomfort Pain Intervention(s): Limited activity within patient's tolerance;Monitored during session;Repositioned    Home Living Family/patient expects to be discharged to:: Skilled nursing facility                 Additional Comments: [facility]    Prior Function Level of Independence: Needs assistance   Gait / Transfers Assistance Needed: Reports she walks ~3 times a week with OT and PT using a walker  ADL's / Homemaking Assistance Needed: Reports she bathes and dresses herself with set up assistance  Comments: Pt reports she primarily uses w/c at SNF because she is not allowed to amb on her own and staff have not been able to assist her with amb.  Wears 3LO2.     Hand Dominance   Dominant Hand: Right    Extremity/Trunk Assessment  Upper Extremity Assessment Upper Extremity Assessment: Defer to OT evaluation    Lower Extremity Assessment Lower Extremity Assessment: RLE deficits/detail;LLE deficits/detail RLE Deficits / Details: knee ROM 15-65 degrees with previous knee injury, grossly 2/5 LLE Deficits / Details: Grossly 3/5    Cervical / Trunk Assessment Cervical / Trunk Assessment: Kyphotic  Communication   Communication: HOH  Cognition Arousal/Alertness: Awake/alert Behavior During Therapy: Flat affect Overall Cognitive Status: Within Functional Limits for tasks assessed                                         General Comments      Exercises General Exercises - Lower Extremity Ankle Circles/Pumps: AROM;Both;5 reps;Supine Long Arc Quad: AROM;Both;10 reps;Seated   Assessment/Plan    PT Assessment Patient needs continued PT services  PT Problem List Decreased strength;Decreased range of motion;Decreased activity tolerance;Decreased balance;Decreased mobility;Decreased knowledge of use of DME;Decreased safety awareness;Decreased knowledge of precautions;Other (comment);Pain       PT Treatment Interventions DME instruction;Gait training;Functional mobility training;Therapeutic activities;Therapeutic exercise;Balance training;Patient/family education    PT Goals (Current goals can be found in the Care Plan section)  Acute Rehab PT Goals Patient Stated Goal: to go back to [facility] PT Goal Formulation: With patient Time For Goal Achievement: 12/21/17 Potential to Achieve Goals: Good    Frequency Min 2X/week   Barriers to discharge Decreased caregiver support      Co-evaluation               AM-PAC PT "6 Clicks" Daily Activity  Outcome Measure Difficulty turning over in bed (including adjusting bedclothes, sheets and blankets)?: Unable Difficulty moving from lying on back to sitting on the side of the bed? : Unable Difficulty sitting down on and standing up from a chair with arms (e.g., wheelchair, bedside commode, etc,.)?: Unable Help needed moving to and from a bed to chair (including a wheelchair)?: Total Help needed walking in hospital room?: Total Help needed climbing 3-5 steps with a railing? : Total 6 Click Score: 6    End of Session Equipment Utilized During Treatment: Gait belt;Oxygen Activity Tolerance: Patient limited by fatigue Patient left: with call bell/phone within reach;in bed Nurse Communication: Mobility status PT Visit Diagnosis: Muscle weakness (generalized) (M62.81);Pain;History of falling (Z91.81) Pain - part of body: (generalized)    Time:  3570-1779 PT Time Calculation (min) (ACUTE ONLY): 12 min   Charges:   PT Evaluation $PT Eval Moderate Complexity: 1 Mod          [name],PT Acute Rehabilitation [phone] (pager)   [name] 12/07/2017, 4:15 PM

## 2017-12-07 NOTE — Clinical Social Work Note (Signed)
Clinical Social Work Assessment  Patient Details  Name: [name] MRN: [id number] Date of Birth: [birth year]  Date of referral:  12/07/17               Reason for consult:  Discharge Planning, Facility Placement                Permission sought to share information with:  Family Supports Permission granted to share information::  Yes, Verbal Permission Granted  Name::     Azusena Erlandson  Agency::  [facility]  Relationship::  son  Contact Information:  [phone]  Housing/Transportation Living arrangements for the past 2 months:  [place] of Information:  Patient Patient Interpreter Needed:  None Criminal Activity/Legal Involvement Pertinent to Current Situation/Hospitalization:  No - Comment as needed Significant Relationships:  Adult Children Lives with:  Facility Resident Do you feel safe going back to the place where you live?  Yes Need for family participation in patient care:  Yes (Comment)  Care giving concerns:  No family at bedside. CSW met patient at bedside to offer support and discuss discharge needs.   Social Worker assessment / plan:  Patient stated she wants to go back to [facility] where she has been as a long term resident. Patient concern about transportation there stating she does not want to go by [facility] due to the cost of transportation. Patient stated she will have son take her back to facility. CSW spoke with facility and they stated they will be able to take patient back  Employment status:  Retired Nurse, adult PT Recommendations:  Not assessed at this time Information / Referral to community resources:  [place]  Patient/Family's Response to care:  Patient appreciates CSW role in care  Patient/Family's Understanding of and Emotional Response to Diagnosis, Current Treatment, and Prognosis:  Patient agreeable with discharge plan back to [facility]  Emotional  Assessment Appearance:  Appears stated age Attitude/Demeanor/Rapport:  Engaged Affect (typically observed):  Accepting Orientation:  Oriented to Self, Oriented to Place, Oriented to  Time, Oriented to Situation Alcohol / Substance use:  Not Applicable Psych involvement (Current and /or in the community):  No (Comment)  Discharge Needs  Concerns to be addressed:  Care Coordination Readmission within the last 30 days:  No Current discharge risk:  Dependent with Mobility Barriers to Discharge:  Continued Medical Work up   [employer], LCSW 12/07/2017, 3:02 PM

## 2017-12-07 NOTE — Progress Notes (Signed)
Notified practitioner of cough and thick productive sputum

## 2017-12-07 NOTE — Progress Notes (Signed)
ANTICOAGULATION CONSULT NOTE   Pharmacy Consult for warfarin Indication: TAVR/AFib  Allergies  Allergen Reactions  . Nubain [Nalbuphine Hcl] Hives    Went into cardiac arrest   . Codeine Hives and Nausea Only    Has tolerated Oxycodone  . Darvocet [Propoxyphene N-Acetaminophen] Nausea Only     Vital Signs: Temp: 97.7 F (36.5 C) (08/19 0856) Temp Source: Oral (08/19 0856) BP: 137/63 (08/19 0856) Pulse Rate: 80 (08/19 0901)  Labs: Recent Labs    12/04/17 2158 12/05/17 0348 12/06/17 0212 12/06/17 0845 12/07/17 0345  HGB  --  10.0* 10.1*  --  10.5*  HCT  --  31.3* 31.3*  --  33.0*  PLT  --  197 264  --  245  LABPROT  --  35.5* 36.7*  --  41.4*  INR  --  3.59 3.74  --  4.37*  CREATININE  --  2.43* 2.47*  --  2.42*  TROPONINI 0.18* 0.15*  --  0.23*  --     Estimated Creatinine Clearance: 15.6 mL/min (A) (by C-G formula based on SCr of 2.42 mg/dL (H)).   Medical History: Past Medical History:  Diagnosis Date  . Acute on chronic renal failure [facility])    sees Dr Allena Katz   . Anemia    Acute blood loss  . Aortic regurgitation   . Aortic stenosis 10/13/2012   Low EF, low gradient with severe aortic stenosis confirmed by dobutamine stress echocardiogram s/p TAVR 12/2012  . Asthma   . Atrial fibrillation (HCC)    tachy-brady syndrome with <1% recurrent PAF since pacemaker placement  . Cataracts, bilateral   . Chronic combined systolic and diastolic CHF (congestive heart failure) (HCC)   . Chronic lower back pain   . CKD (chronic kidney disease)   . Coronary artery disease involving native coronary artery of native heart   . Dementia    Without behavioral disturbance  . Dysrhythmia   . Fibromyalgia   . Gastroesophageal reflux disease   . H/O dizziness   . H/O urinary frequency   . H/O: stroke   . Hard of hearing   . Headache    hx of migraines   . Heart murmur   . History of blood transfusion   . History of bronchitis   . History of kidney stones   . History of  urinary tract infection   . HLD (hyperlipidemia)   . HTN (hypertension)   . Hypothyroidism   . Neuropathy   . Nonischemic cardiomyopathy (HCC)   . On home oxygen therapy    patient uses at nite- 2L- has not used in > 6 months per patient  . Osteoarthritis   . Osteoporosis   . Pneumonia    hx of x 3   . PONV (postoperative nausea and vomiting)   . Presence of permanent cardiac pacemaker   . Pulmonary embolism (HCC)    HISTORY OF, the pt. had a recurrent bilateral pulmonary emboli in 2005, on warfarin therapy and at which time she under went implantation of IVC filter  . Repeated falls   . Rupture of right patellar tendon   . Sciatica   . Shortness of breath dyspnea    with exertion   . Sleep apnea    uses oxygen at night and PRN- not used since > 6 months / DOES NOT USE  C-PAP  . Spinal stenosis   . Stroke (HCC)   . Symptomatic bradycardia   . Symptomatic bradycardia 2012   s/p  [employer] PPM  . Syncope   . Urinary incontinence   . Urinary urgency    Assessment:  3 yoF on warfarin PTA for TAVR and Afib . Last dose PTA 8/15. INR on admit 4.65. PTA regimen per nursing home is 4 mg daily.   INR today increased from 3.74 to 4.37. Hgb low stable. Plt wnl. On amiodarone (on PTA). On cefepime for HCAP - can impact INR sensitivity. Blood tinged sputum seems to have improved per RN.   Goal of Therapy:  INR 2-3 Monitor platelets by anticoagulation protocol: Yes   Plan:  Continue to hold warfarin Daily INR/CBC Monitor for s/sx of bleeding  Vinnie Level, PharmD., BCPS Clinical Pharmacist Clinical phone for 12/07/17 until 3:30pm: [phone] If after 3:30pm, please refer to [facility] for unit-specific pharmacist

## 2017-12-07 NOTE — Progress Notes (Signed)
CRITICAL VALUE ALERT  Critical Value:  INR 4.37  Date & Time Notied:  12/07/17 @ 05:22  Provider Notified: Bruna Potter   Orders Received/Actions taken:

## 2017-12-07 NOTE — Progress Notes (Signed)
Notified practitioner of blood sputum.

## 2017-12-07 NOTE — Progress Notes (Signed)
PROGRESS NOTE        PATIENT DETAILS Name: LIBBI RATHORE Age: 84 y.o. Sex: female Date of Birth: [birth year] Admit Date: 12/04/2017 Admitting Physician Delaine Lame, MD PCP:Kim, Fayrene Fearing, MD  Brief Narrative: Patient is a 84 y.o. female with history of chronic systolic heart failure, stage IV CKD, dementia, severe aortic stenosis status post TAVR 2014, PAF on warfarin, symptomatic bradycardia requiring permanent pacemaker implantation, chronic hypoxic respiratory failure on home O2 2.5 L/min,-presented with sudden onset of shortness of breath when she woke up on 8/16, she was found to be hypoxic and promptly transferred to the emergency room.  She was started on BiPAP, she was thought to have decompensated heart failure, given Lasix and admitted to the hospitalist service.  See below for further details.  Subjective:  Patient in bed, currently in no distress, denies any headache or chest pain, shortness of breath is much improved.  Overall feels better.  Assessment/Plan: Acute on chronic hypoxic respiratory failure likely secondary to acute decompensated systolic heart failure (EF 25-30% by TTE on 11/08/2017) vs HCAP: Clinically somewhat improved after high-dose IV Lasix, cardiology following, has diuresed well, upon admission proBNP was elevated, also CT suggestive of possible atypical pneumonia for which azithromycin to third-generation cephalosporin has been added, continue supportive care with oxygen nebulizer treatments.  Follow cultures.  Clinically definitely improving, likely discharge to SNF in the next 1 to 2 days.  Elevated troponins: Did have some chest pain this morning that was relieved by nitroglycerin, I suspect this is demand ischemia, EKG continues to show a left bundle.  She apparently had a stress test in 2018 that did not show any ischemia.  Given her frailty, CKD stage IV, she has been managed medically.  Neurology on board.  Acute kidney injury  on CKD stage IV: Baseline creatinine close to 2.2, continue monitoring cautiously with diuresis, acute renal failure only mild to minimal avoid nephrotoxins.  She does follow with Dr. Allena Katz (nephrology) in the outpatient setting.  PAF [place] vas 2 score of at least 3: Did have a few episodes of RVR overnight-currently rate controlled-continue amiodarone, INR slightly supratherapeutic-Coumadin is being managed by pharmacy team.  We have added low-dose beta-blocker.  History of symptomatic bradycardia requiring pacemaker implantation: Monitor on telemetry.  Migraine headache: On 8/18 morning-solved after IV Depakote, avoiding triptan's due to underlying cardiac issues.  History of severe aortic stenosis requiring TAVR 2014  -  no acute issue.  Dyslipidemia: Continue statin  Hypothyroidism: Continue Synthroid  Dementia: Continue Aricept  OSA: CPAP nightly  DVT Prophylaxis: Full dose anticoagulation with Coumadin  Lab Results  Component Value Date   INR 4.37 (HH) 12/07/2017   INR 3.74 12/06/2017   INR 3.59 12/05/2017     Code Status: Full code   Family Communication: None at bedside  Disposition Plan: SNF in the next 1 to 2 days if respiratory status continues to improve  Antimicrobial agents: Anti-infectives (From admission, onward)   Start     Dose/Rate Route Frequency Ordered Stop   12/07/17 0830  azithromycin (ZITHROMAX) 500 mg in sodium chloride 0.9 % 250 mL IVPB     500 mg 250 mL/hr over 60 Minutes Intravenous Daily 12/07/17 0720     12/06/17 1100  ceFEPIme (MAXIPIME) 1 g in sodium chloride 0.9 % 100 mL IVPB     1 g 200 mL/hr  over 30 Minutes Intravenous Every 24 hours 12/06/17 1048        Procedures: None  CONSULTS:  cardiology  Time spent: 35 minutes-Greater than 50% of this time was spent in counseling, explanation of diagnosis, planning of further management, and coordination of care.  MEDICATIONS: Scheduled Meds: . allopurinol  100 mg Oral Daily  .  amiodarone  100 mg Oral Daily  . atorvastatin  10 mg Oral q1800  . calcitRIOL  0.25 mcg Oral Daily  . Chlorhexidine Gluconate Cloth  6 each Topical Q0600  . cholecalciferol  2,000 Units Oral QHS  . cycloSPORINE  1 drop Both Eyes BID  . docusate sodium  100 mg Oral BID  . donepezil  10 mg Oral QHS  . DULoxetine  60 mg Oral q1800  . ferrous sulfate  325 mg Oral Daily  . gabapentin  200 mg Oral BID  . isosorbide mononitrate  30 mg Oral Daily  . levothyroxine  100 mcg Oral QAC breakfast  . lidocaine  1 patch Transdermal Q24H  . linaclotide  290 mcg Oral Daily  . metoprolol tartrate  12.5 mg Oral BID  . multivitamin  1 tablet Oral Daily  . mupirocin ointment  1 application Nasal BID  . MUSCLE RUB   Topical BID  . pantoprazole  40 mg Oral Q1200  . polyethylene glycol  17 g Oral Daily  . senna  1 tablet Oral BID  . sodium chloride flush  3 mL Intravenous Q12H  . Warfarin - Pharmacist Dosing Inpatient   Does not apply q1800   Continuous Infusions: . sodium chloride    . azithromycin    . ceFEPime (MAXIPIME) IV 1 g (12/07/17 1030)   PRN Meds:.sodium chloride, acetaminophen **OR** acetaminophen, benzonatate, guaiFENesin, HYDROcodone-acetaminophen, ipratropium-albuterol, nitroGLYCERIN, ondansetron **OR** ondansetron (ZOFRAN) IV, sodium chloride flush   PHYSICAL EXAM: Vital signs: Vitals:   12/06/17 2100 12/07/17 0450 12/07/17 0856 12/07/17 0901  BP:   137/63   Pulse:    80  Resp: 16  12   Temp:   97.7 F (36.5 C)   TempSrc:   Oral   SpO2: 98%  94%   Weight:  71.7 kg    Height:       Filed Weights   12/06/17 0411 12/06/17 1045 12/07/17 0450  Weight: 72.6 kg 72.6 kg 71.7 kg   Body mass index is 30.87 kg/m.   Exam  Awake Alert, Oriented X 3, No new F.N deficits, Normal affect [place].AT,PERRAL Supple Neck,No JVD, No cervical lymphadenopathy appriciated.  Symmetrical Chest wall movement, Good air movement bilaterally, slightly coarse breath sounds bibasilar RRR,No Gallops,  Rubs or new Murmurs, No Parasternal Heave +ve B.Sounds, Abd Soft, No tenderness, No organomegaly appriciated, No rebound - guarding or rigidity. No Cyanosis, Clubbing or edema, No new Rash or bruise  I have personally reviewed following labs and imaging studies  LABORATORY DATA: CBC: Recent Labs  Lab 12/04/17 1037 12/05/17 0348 12/06/17 0212 12/07/17 0345  WBC 14.4* 8.5 14.2* 10.6*  NEUTROABS 12.7* 7.2  --   --   HGB 11.6* 10.0* 10.1* 10.5*  HCT 37.2 31.3* 31.3* 33.0*  MCV 102.5* 99.4 99.1 100.0  PLT 177 197 264 245    Basic Metabolic Panel: Recent Labs  Lab 12/04/17 1037 12/05/17 0348 12/06/17 0212 12/07/17 0345  NA 139 143 143 139  K 4.1 4.8 3.7 4.0  CL 103 101 100 96*  CO2 23 26 32 27  GLUCOSE 107* 123* 104* 99  BUN 33* 43* 53*  57*  CREATININE 2.08* 2.43* 2.47* 2.42*  CALCIUM 9.4 9.1 9.6 9.3    GFR: Estimated Creatinine Clearance: 15.6 mL/min (A) (by C-G formula based on SCr of 2.42 mg/dL (H)).  Liver Function Tests: Recent Labs  Lab 12/06/17 0845  AST 29  ALT 24  ALKPHOS 48  BILITOT 1.0  PROT 5.8*  ALBUMIN 3.0*   No results for input(s): LIPASE, AMYLASE in the last 168 hours. No results for input(s): AMMONIA in the last 168 hours.  Coagulation Profile: Recent Labs  Lab 12/04/17 1037 12/05/17 0348 12/06/17 0212 12/07/17 0345  INR 4.65* 3.59 3.74 4.37*    Cardiac Enzymes: Recent Labs  Lab 12/04/17 1650 12/04/17 2158 12/05/17 0348 12/06/17 0845  TROPONINI 0.18* 0.18* 0.15* 0.23*    BNP (last 3 results) No results for input(s): PROBNP in the last 8760 hours.  HbA1C: No results for input(s): HGBA1C in the last 72 hours.  CBG: No results for input(s): GLUCAP in the last 168 hours.  Lipid Profile: No results for input(s): CHOL, HDL, LDLCALC, TRIG, CHOLHDL, LDLDIRECT in the last 72 hours.  Thyroid Function Tests: No results for input(s): TSH, T4TOTAL, FREET4, T3FREE, THYROIDAB in the last 72 hours.  Anemia Panel: No results for  input(s): VITAMINB12, FOLATE, FERRITIN, TIBC, IRON, RETICCTPCT in the last 72 hours.  Urine analysis:    Component Value Date/Time   COLORURINE YELLOW 07/15/2016 0914   APPEARANCEUR CLEAR 07/15/2016 0914   LABSPEC 1.009 07/15/2016 0914   PHURINE 5.0 07/15/2016 0914   GLUCOSEU NEGATIVE 07/15/2016 0914   HGBUR NEGATIVE 07/15/2016 0914   BILIRUBINUR NEGATIVE 07/15/2016 0914   KETONESUR NEGATIVE 07/15/2016 0914   PROTEINUR NEGATIVE 07/15/2016 0914   UROBILINOGEN 0.2 11/29/2014 1411   NITRITE NEGATIVE 07/15/2016 0914   LEUKOCYTESUR NEGATIVE 07/15/2016 0914    Sepsis Labs: Lactic Acid, Venous    Component Value Date/Time   LATICACIDVEN 1.56 11/07/2017 0402    MICROBIOLOGY: Recent Results (from the past 240 hour(s))  MRSA PCR Screening     Status: Abnormal   Collection Time: 12/04/17  4:24 PM  Result Value Ref Range Status   MRSA by PCR POSITIVE (A) NEGATIVE Final    Comment:        The GeneXpert MRSA Assay (FDA approved for NASAL specimens only), is one component of a comprehensive MRSA colonization surveillance program. It is not intended to diagnose MRSA infection nor to guide or monitor treatment for MRSA infections. CRITICAL RESULT CALLED TO, READ BACK BY AND VERIFIED WITH: Donnetta Hutching, RN AT 1805 ON 12/04/17 BY C. JESSUP, MLT. Performed at [facility], [address] [place], [place] [address]     RADIOLOGY STUDIES/RESULTS: Ct Chest Wo Contrast  Result Date: 12/06/2017 CLINICAL DATA:  Persistent shortness of breath. EXAM: CT CHEST WITHOUT CONTRAST TECHNIQUE: Multidetector CT imaging of the chest was performed following the standard protocol without IV contrast. COMPARISON:  Chest radiograph 12/06/2017 FINDINGS: Cardiovascular: Enlarged heart. No sizable pericardial effusion. Post aortic valve replacement. Dual lead cardiac pacemaker in appropriate position. Calcific atherosclerotic disease and tortuosity of the aorta Mediastinum/Nodes: Mild mediastinal  lymphadenopathy. Index left prevascular lymph node measures 1.3 cm in short axis. Precarinal lymph node measures 1.2 cm in short axis. Moderate in size hiatal hernia. Lungs/Pleura: Diffusely scattered patchy bilateral areas of airspace consolidation. More confluent airspace consolidation versus atelectasis in bilateral lung bases. Bilateral small pleural effusions. Upper Abdomen: No acute abnormality. Musculoskeletal: Spondylosis of the thoracic spine. IMPRESSION: Diffusely scattered patchy bilateral areas of airspace consolidation, with more  confluent consolidation in bilateral lung bases. Findings most consistent with multifocal pneumonia, with superimposed atelectasis in the lung bases. Asymmetric pulmonary edema is a secondary consideration. Small bilateral pleural effusions. Mild likely reactive mediastinal lymphadenopathy. Enlarged cardiac silhouette. Aortic Atherosclerosis (ICD10-I70.0). Electronically Signed   By: Ted Mcalpine M.D.   On: 12/06/2017 18:53   Ct Thoracic Spine Wo Contrast  Result Date: 11/08/2017 CLINICAL DATA:  Chronic diffuse thoracic and lumbar pain. No injury. EXAM: CT THORACIC AND LUMBAR SPINE WITHOUT CONTRAST TECHNIQUE: Multidetector CT imaging of the thoracic and lumbar spine was performed without contrast. Multiplanar CT image reconstructions were also generated. COMPARISON:  Chest CT 11/03/2017 and 12/14/2014 as well as abdominopelvic CT 11/10/2012. FINDINGS: CT THORACIC SPINE FINDINGS Alignment: Subtle biphasic curvature with primary curvature convex right and secondary curvature convex left. No subluxation. Vertebrae: Vertebral body heights are maintained. There is mild spondylosis throughout the thoracic spine. Minimal right-sided neural foraminal narrowing at the T3-4 level. Mild right-sided neural foraminal narrowing at the T9-10 level. Mild bilateral neural from narrowing at the T10-11 and T11-12 levels. Minimal canal stenosis at the T11-12 level due to posterior  aspect formation and facet arthropathy. No evidence of compression fracture or subluxation. Paraspinal and other soft tissues: Paravertebral soft tissues are within normal. Calcified plaque over the thoracic aorta. Prosthetic aortic valve is present. Cardiomegaly. Mild bibasilar atelectasis and tiny amount right pleural fluid. Small hiatal hernia. Calcified plaque over the abdominal aorta. Small cyst over the upper pole right kidney. Disc levels: Moderate disc space narrowing at the T3-4 and T5-6 levels with mild disc space narrowing throughout the remainder of the thoracic spine. CT LUMBAR SPINE FINDINGS Segmentation: 5 lumbar type vertebrae. Alignment: Slight curvature convex right. Vertebrae: Vertebral body heights are normal. There is moderate spondylosis throughout the lumbar spine. There is no compression fracture or spondylolisthesis. Prominent left paravertebral osteophyte at the L2-3 level. L1-2 level demonstrates mild broad-based disc bulge. Moderate facet arthropathy throughout the lumbar spine. L2-3 level demonstrates mild canal stenosis due to broad-based disc bulge, facet arthropathy and minimal ligamentum flavum hypertrophy. L3-4 level demonstrates mild broad-based disc bulge. There is mild narrowing of the canal in the transverse dimension. Mild bilateral neural foraminal narrowing. L4-5 level demonstrates mild broad-based disc bulge. Borderline canal stenosis. Severe bilateral neural foraminal narrowing right worse than left. L5-S1 level demonstrates broad-based disc bulge with minimal canal stenosis. Moderate to severe bilateral neural foraminal narrowing. Paraspinal and other soft tissues: Moderate calcified plaque over the abdominal aorta. Small cyst over the upper pole and midpole right kidney. IVC filter is present. Diverticulosis of the colon. Disc levels: Moderate disc space narrowing at all levels of the lumbar spine with vacuum disc phenomenon. IMPRESSION: CT THORACIC SPINE IMPRESSION No  acute findings. Mild spondylosis of the thoracic spine. Multilevel disc disease and multilevel neural foraminal narrowing as described. Mild biphasic curvature. Minimal canal stenosis at the T11-12 level. CT LUMBAR SPINE IMPRESSION No acute findings. Moderate spondylosis throughout the lumbar spine with moderate multilevel degenerative disc disease. Varying degrees of canal stenosis at several levels as described. Varying degrees of neural foraminal narrowing at multiple levels worse at the L4-5 and L5-S1 levels. Minimal curvature convex right. Other ancillary findings over the thorax and abdomen/pelvis as described including aortic atherosclerosis, right renal cysts and bibasilar atelectasis as well as small amount right pleural fluid. Electronically Signed   By: Elberta Fortis M.D.   On: 11/08/2017 11:47   Ct Lumbar Spine Wo Contrast  Result Date: 11/08/2017 CLINICAL DATA:  Chronic  diffuse thoracic and lumbar pain. No injury. EXAM: CT THORACIC AND LUMBAR SPINE WITHOUT CONTRAST TECHNIQUE: Multidetector CT imaging of the thoracic and lumbar spine was performed without contrast. Multiplanar CT image reconstructions were also generated. COMPARISON:  Chest CT 11/03/2017 and 12/14/2014 as well as abdominopelvic CT 11/10/2012. FINDINGS: CT THORACIC SPINE FINDINGS Alignment: Subtle biphasic curvature with primary curvature convex right and secondary curvature convex left. No subluxation. Vertebrae: Vertebral body heights are maintained. There is mild spondylosis throughout the thoracic spine. Minimal right-sided neural foraminal narrowing at the T3-4 level. Mild right-sided neural foraminal narrowing at the T9-10 level. Mild bilateral neural from narrowing at the T10-11 and T11-12 levels. Minimal canal stenosis at the T11-12 level due to posterior aspect formation and facet arthropathy. No evidence of compression fracture or subluxation. Paraspinal and other soft tissues: Paravertebral soft tissues are within normal.  Calcified plaque over the thoracic aorta. Prosthetic aortic valve is present. Cardiomegaly. Mild bibasilar atelectasis and tiny amount right pleural fluid. Small hiatal hernia. Calcified plaque over the abdominal aorta. Small cyst over the upper pole right kidney. Disc levels: Moderate disc space narrowing at the T3-4 and T5-6 levels with mild disc space narrowing throughout the remainder of the thoracic spine. CT LUMBAR SPINE FINDINGS Segmentation: 5 lumbar type vertebrae. Alignment: Slight curvature convex right. Vertebrae: Vertebral body heights are normal. There is moderate spondylosis throughout the lumbar spine. There is no compression fracture or spondylolisthesis. Prominent left paravertebral osteophyte at the L2-3 level. L1-2 level demonstrates mild broad-based disc bulge. Moderate facet arthropathy throughout the lumbar spine. L2-3 level demonstrates mild canal stenosis due to broad-based disc bulge, facet arthropathy and minimal ligamentum flavum hypertrophy. L3-4 level demonstrates mild broad-based disc bulge. There is mild narrowing of the canal in the transverse dimension. Mild bilateral neural foraminal narrowing. L4-5 level demonstrates mild broad-based disc bulge. Borderline canal stenosis. Severe bilateral neural foraminal narrowing right worse than left. L5-S1 level demonstrates broad-based disc bulge with minimal canal stenosis. Moderate to severe bilateral neural foraminal narrowing. Paraspinal and other soft tissues: Moderate calcified plaque over the abdominal aorta. Small cyst over the upper pole and midpole right kidney. IVC filter is present. Diverticulosis of the colon. Disc levels: Moderate disc space narrowing at all levels of the lumbar spine with vacuum disc phenomenon. IMPRESSION: CT THORACIC SPINE IMPRESSION No acute findings. Mild spondylosis of the thoracic spine. Multilevel disc disease and multilevel neural foraminal narrowing as described. Mild biphasic curvature. Minimal canal  stenosis at the T11-12 level. CT LUMBAR SPINE IMPRESSION No acute findings. Moderate spondylosis throughout the lumbar spine with moderate multilevel degenerative disc disease. Varying degrees of canal stenosis at several levels as described. Varying degrees of neural foraminal narrowing at multiple levels worse at the L4-5 and L5-S1 levels. Minimal curvature convex right. Other ancillary findings over the thorax and abdomen/pelvis as described including aortic atherosclerosis, right renal cysts and bibasilar atelectasis as well as small amount right pleural fluid. Electronically Signed   By: Elberta Fortis M.D.   On: 11/08/2017 11:47   Dg Chest Port 1 View  Result Date: 12/06/2017 CLINICAL DATA:  Shortness of breath. EXAM: PORTABLE CHEST 1 VIEW COMPARISON:  12/04/2017 FINDINGS: Right-sided pacemaker unchanged. Evidence of previous aortic valve replacement unchanged. Lungs are hypoinflated demonstrate left basilar opacification likely effusion with atelectasis although infection in the left base is possible. There is interval worsening of the perihilar markings right worse than left compatible with mild asymmetric interstitial edema versus infection. Mild stable cardiomegaly. Remainder of the exam is unchanged. IMPRESSION:  Left base opacification slightly worse likely small effusion with atelectasis, although infection is possible. Prominent bilateral perihilar markings right worse than left which may be due to asymmetric edema versus infection. Mild stable cardiomegaly. Electronically Signed   By: Elberta Fortis M.D.   On: 12/06/2017 08:01   Dg Chest Portable 1 View  Result Date: 12/04/2017 CLINICAL DATA:  Respiratory distress. EXAM: PORTABLE CHEST 1 VIEW COMPARISON:  Chest x-ray 11/07/2017. FINDINGS: Cardiac pacer with lead tip over the right atrium right ventricle. Prior cardiac valve replacement. Cardiomegaly. Diffuse bilateral pulmonary interstitial prominence and small bilateral pleural effusions.  Findings consistent with CHF. IMPRESSION: 1. Cardiac pacer with lead tips over the right atrium and right ventricle. Prior cardiac valve replacement. 2. Findings suggesting congestive heart failure with bilateral pulmonary interstitial edema and bilateral pleural effusions. Electronically Signed   By: Maisie Fus  Register   On: 12/04/2017 10:56     LOS: 3 days   Susa Raring, MD  [facility]  If 7PM-7AM, please contact night-coverage  Please page via [email or web address] [email or web address]-click on MD name and type text message  12/07/2017, 11:19 AM

## 2017-12-07 NOTE — Telephone Encounter (Signed)
Called and spoke with pt's son Onalee Hua regarding pt is currently in [facility], admitted since 12/04/17 Pt's O2 is better and stable, cannot catch breath well, SOB and wheezing, and has CHF symptoms He is frustrated that no one for our office has not had anyone from pulmonary conduct a consult on pt He feels that pt needs to be seen by Dr. Isaiah Serge in hospital at [facility] Rm 3 He has asked the nurse several times in last 2 days for a pulmonary consult and it has not happened yet Dr. Isaiah Serge please advise.

## 2017-12-08 LAB — CBC
HCT: 32.4 % — ABNORMAL LOW (ref 36.0–46.0)
Hemoglobin: 10.4 g/dL — ABNORMAL LOW (ref 12.0–15.0)
MCH: 32.1 pg (ref 26.0–34.0)
MCHC: 32.1 g/dL (ref 30.0–36.0)
MCV: 100 fL (ref 78.0–100.0)
Platelets: 220 10*3/uL (ref 150–400)
RBC: 3.24 MIL/uL — ABNORMAL LOW (ref 3.87–5.11)
RDW: 14.2 % (ref 11.5–15.5)
WBC: 11.3 K/uL — ABNORMAL HIGH (ref 4.0–10.5)

## 2017-12-08 LAB — PROTIME-INR
INR: 3.36
Prothrombin Time: 33.7 s — ABNORMAL HIGH (ref 11.4–15.2)

## 2017-12-08 LAB — BASIC METABOLIC PANEL WITH GFR
Anion gap: 10 (ref 5–15)
CO2: 32 mmol/L (ref 22–32)
Calcium: 9.4 mg/dL (ref 8.9–10.3)
Chloride: 95 mmol/L — ABNORMAL LOW (ref 98–111)
Creatinine, Ser: 2.26 mg/dL — ABNORMAL HIGH (ref 0.44–1.00)
GFR calc Af Amer: 22 mL/min — ABNORMAL LOW (ref >60)
Sodium: 137 mmol/L (ref 135–145)

## 2017-12-08 LAB — MAGNESIUM: MAGNESIUM: 2.1 mg/dL (ref 1.7–2.4)

## 2017-12-08 LAB — BASIC METABOLIC PANEL
BUN: 57 mg/dL — ABNORMAL HIGH (ref 8–23)
GFR calc non Af Amer: 19 mL/min — ABNORMAL LOW (ref >60)
Glucose, Bld: 120 mg/dL — ABNORMAL HIGH (ref 70–99)
Potassium: 4 mmol/L (ref 3.5–5.1)

## 2017-12-08 MED ORDER — BISACODYL 10 MG RE SUPP: 10.0000 mg | Freq: Every day | RECTAL | 0 refills | Status: DC | PRN

## 2017-12-08 MED ORDER — METOLAZONE 2.5 MG PO TABS: ORAL_TABLET | ORAL | Status: DC

## 2017-12-08 MED ORDER — SPIRONOLACTONE 25 MG PO TABS
25.0000 mg | ORAL_TABLET | Freq: Once | ORAL | Status: AC
  Administered 2017-12-08: 25 mg via ORAL
  Filled 2017-12-08: qty 1

## 2017-12-08 MED ORDER — BISACODYL 10 MG RE SUPP
10.0000 mg | Freq: Every day | RECTAL | Status: DC
  Administered 2017-12-08: 10 mg via RECTAL
  Filled 2017-12-08: qty 1

## 2017-12-08 MED ORDER — AZITHROMYCIN 500 MG PO TABS: 500.0000 mg | ORAL_TABLET | Freq: Every day | ORAL | 0 refills | Status: DC

## 2017-12-08 MED ORDER — DOCUSATE SODIUM 100 MG PO CAPS: 100.0000 mg | ORAL_CAPSULE | Freq: Two times a day (BID) | ORAL | 0 refills | Status: AC

## 2017-12-08 MED ORDER — POLYETHYLENE GLYCOL 3350 17 G PO PACK: 17.0000 g | PACK | Freq: Two times a day (BID) | ORAL | Status: DC

## 2017-12-08 MED ORDER — TORSEMIDE 20 MG PO TABS
40.0000 mg | ORAL_TABLET | Freq: Every day | ORAL | Status: DC
  Administered 2017-12-08: 40 mg via ORAL
  Filled 2017-12-08: qty 2

## 2017-12-08 MED ORDER — METOPROLOL TARTRATE 25 MG PO TABS: 12.5000 mg | ORAL_TABLET | Freq: Two times a day (BID) | ORAL | Status: DC

## 2017-12-08 NOTE — Discharge Summary (Signed)
[name]:[id number] DOB: March 14, 1933 DOA: 12/04/2017  PCP: Pearson Grippe, MD  Admit date: 12/04/2017  Discharge date: 12/08/2017  Admitted From: SNF   Disposition:  SNF   Recommendations for Outpatient Follow-up:   Follow up with PCP in 1-2 weeks  PCP Please obtain BMP/CBC, 2 view CXR in 1week,  (see Discharge instructions)   PCP Please follow up on the following pending results:    Home Health: None   Equipment/Devices: None  Consultations: Cards Discharge Condition: Fair   CODE STATUS: Full   Diet Recommendation: Heart healthy with strict 1.5 L/day total fluid restriction.   Chief Complaint  Patient presents with  . Respiratory Distress     Brief history of present illness from the day of admission and additional interim summary    Patient is a 84 y.o. female with history of chronic systolic heart failure, stage IV CKD, dementia, severe aortic stenosis status post TAVR 2014, PAF on warfarin, symptomatic bradycardia requiring permanent pacemaker implantation, chronic hypoxic respiratory failure on home O2 2.5 L/min,-presented with sudden onset of shortness of breath when she woke up on 8/16, she was found to be hypoxic and promptly transferred to the emergency room.  She was started on BiPAP, she was thought to have decompensated heart failure, given Lasix and admitted to the hospitalist service.  See below for further details.                                                                 Hospital Course    Acute on chronic hypoxic respiratory failure likely secondary to acute decompensated systolic heart failure (EF 25-30% by TTE on 11/08/2017) vs HCAP: Clinically somewhat improved after high-dose IV Lasix, was seen by Cards, has diuresed well, upon admission proBNP was elevated, also CT suggestive  of possible atypical pneumonia for which she was treated with empiric antibiotics which included azithromycin and third-generation cephalosporin.    He is now close to her baseline, she is 97% on 2 L nasal cannula oxygen which will be continued upon discharge, continue home dose of Demadex but have added PRN Zaroxolyn to be taken once a week PRN for weight gain more than 2.5 pounds, edema or rails.  Request SNF staff to monitor her weight, BMP and diuretic dose closely, also advised one-time outpatient cardiology follow-up in the next 1 to 2 weeks, continue supportive care with oxygen nebulizer treatments.  She will be discharged back to SNF today.  Elevated troponins: Did have some chest pain this morning that was relieved by nitroglycerin, I suspect this is demand ischemia, EKG continues to show a left bundle.  She apparently had a stress test in 2018 that did not show any ischemia.  Given her frailty, CKD stage IV, she has been managed medically.  Neurology on board.  Acute kidney injury on CKD stage IV: Baseline creatinine close to 2.2, she had mild acute decompensation and it has resolved she is back to her baseline now, she follows with Dr. Allena Katz nephrology outpatient which she should continue as before.  PAF [place] vas 2 score of at least 3: Did have a few episodes of RVR overnight-currently rate controlled-continue amiodarone, INR slightly supratherapeutic-Coumadin is being managed by pharmacy team.  We have added low-dose beta-blocker.  History of symptomatic bradycardia requiring pacemaker implantation: Monitor on telemetry.  Migraine headache: On 8/18 morning-solved after IV Depakote, avoiding triptan's due to underlying cardiac issues.  History of severe aortic stenosis requiring TAVR 2014  -  no acute issue.  Low with Dr. Excell Seltzer post discharge.  Dyslipidemia: Continue statin  Hypothyroidism: Continue Synthroid  Dementia: Continue Aricept  Persistent hoarse voice.  Ongoing  for several months.  Outpatient ENT consult.    OSA: CPAP nightly    Discharge diagnosis     Active Problems:   Pacemaker   Paroxysmal atrial fibrillation (HCC)   Dyslipidemia   Hypertension   Acute on chronic combined systolic and diastolic CHF (congestive heart failure) (HCC)   Severe aortic stenosis   S/P TAVR (transcatheter aortic valve replacement)   S/P right TKA   Dementia without behavioral disturbance   Neuropathic pain   Hypothyroidism   CKD (chronic kidney disease) stage 4, GFR 15-29 ml/min (HCC)   Respiratory failure (HCC)   Acute on chronic combined systolic and diastolic congestive heart failure (HCC)   Acute on chronic congestive heart failure (HCC)   Elevated brain natriuretic peptide (BNP) level   SOB (shortness of breath)    Discharge instructions    Discharge Instructions    Discharge instructions   Complete by:  As directed    Follow with Primary MD Pearson Grippe, MD in 7 days   Get CBC, CMP, INR  checked  By SNF MD in 2 days   Activity: As tolerated with Full fall precautions use walker/cane & assistance as needed  Disposition SNF  Diet:   Heart Healthy   Check your Weight same time everyday, if you gain over 2 pounds, or you develop in leg swelling, experience more shortness of breath or chest pain, call your Primary MD immediately. Follow Cardiac Low Salt Diet and 1.5 lit/day fluid restriction.  Special Instructions: If you have smoked or chewed Tobacco  in the last 2 yrs please stop smoking, stop any regular Alcohol  and or any Recreational drug use.  On your next visit with your primary care physician please Get Medicines reviewed and adjusted.  Please request your Prim.MD to go over all Hospital Tests and Procedure/Radiological results at the follow up, please get all Hospital records sent to your Prim MD by signing hospital release before you go home.  If you experience worsening of your admission symptoms, develop shortness of breath, life  threatening emergency, suicidal or homicidal thoughts you must seek medical attention immediately by calling 911 or calling your MD immediately  if symptoms less severe.  You Must read complete instructions/literature along with all the possible adverse reactions/side effects for all the Medicines you take and that have been prescribed to you. Take any new Medicines after you have completely understood and accpet all the possible adverse reactions/side effects.   Increase activity slowly   Complete by:  As directed       Discharge Medications   Allergies as of 12/08/2017      Reactions   Nubain [  nalbuphine Hcl] Hives   Went into cardiac arrest    Codeine Hives, Nausea Only   Has tolerated Oxycodone   Darvocet [propoxyphene N-acetaminophen] Nausea Only      Medication List    TAKE these medications   allopurinol 100 MG tablet Commonly known as:  ZYLOPRIM Take 100 mg by mouth daily.   amiodarone 100 MG tablet Commonly known as:  PACERONE Take 100 mg by mouth daily.   atorvastatin 10 MG tablet Commonly known as:  LIPITOR Take 10 mg by mouth daily at 6 PM.   azithromycin 500 MG tablet Commonly known as:  ZITHROMAX Take 1 tablet (500 mg total) by mouth daily. 4 more doses   benzonatate 100 MG capsule Commonly known as:  TESSALON Take 100 mg by mouth 2 (two) times daily as needed for cough.   BIOFREEZE 4 % Gel Generic drug:  Menthol (Topical Analgesic) Apply 1 application topically 2 (two) times daily.   bisacodyl 10 MG suppository Commonly known as:  DULCOLAX Place 1 suppository (10 mg total) rectally daily as needed for moderate constipation.   calcitRIOL 0.25 MCG capsule Commonly known as:  ROCALTROL Take 0.25 mcg by mouth daily.   cholecalciferol 1000 units tablet Commonly known as:  VITAMIN D Take 2,000 Units by mouth at bedtime.   cycloSPORINE 0.05 % ophthalmic emulsion Commonly known as:  RESTASIS Place 1 drop into both eyes 2 (two) times daily.     docusate sodium 100 MG capsule Commonly known as:  COLACE Take 1 capsule (100 mg total) by mouth 2 (two) times daily.   donepezil 10 MG tablet Commonly known as:  ARICEPT Take 10 mg by mouth at bedtime.   DULoxetine 60 MG capsule Commonly known as:  CYMBALTA Take 60 mg by mouth daily at 6 PM.   ferrous sulfate 325 (65 FE) MG tablet Take 325 mg by mouth daily.   gabapentin 100 MG capsule Commonly known as:  NEURONTIN Take 200 mg by mouth 2 (two) times daily.   HYDROcodone-acetaminophen 5-325 MG tablet Commonly known as:  NORCO/VICODIN Take 1 tablet by mouth every 4 (four) hours as needed for severe pain.   ipratropium-albuterol 0.5-2.5 (3) MG/3ML Soln Commonly known as:  DUONEB Take 3 mLs by nebulization every 6 (six) hours as needed (wheezing/shortness of breath).   isosorbide mononitrate 30 MG 24 hr tablet Commonly known as:  IMDUR Take 1 tablet (30 mg total) by mouth daily.   levothyroxine 100 MCG tablet Commonly known as:  SYNTHROID, LEVOTHROID Take 1 tablet (100 mcg total) by mouth daily before breakfast.   lidocaine 5 % Commonly known as:  LIDODERM Place 1 patch onto the skin daily. Apply to lower back as needed for pain.   LINZESS 290 MCG Caps capsule Generic drug:  linaclotide Take 290 mcg by mouth daily.   metolazone 2.5 MG tablet Commonly known as:  ZAROXOLYN 1 pill every Wednesday as needed once a week for weight gain over 2.5 pounds, edema or shortness of breath.   metoprolol tartrate 25 MG tablet Commonly known as:  LOPRESSOR Take 0.5 tablets (12.5 mg total) by mouth 2 (two) times daily.   mineral oil enema Place 1 enema rectally once. Administer if KUB is positive.   multivitamin Tabs tablet Take 1 tablet by mouth daily.   mupirocin ointment 2 % Commonly known as:  BACTROBAN Place 1 application into the nose 2 (two) times daily.   nitroGLYCERIN 0.4 MG SL tablet Commonly known as:  NITROSTAT Place 0.4 mg under  the tongue every 5 (five)  minutes as needed for chest pain (MAX 3 TABLETS).   polyethylene glycol packet Commonly known as:  MIRALAX / GLYCOLAX Take 17 g by mouth daily.   torsemide 20 MG tablet Commonly known as:  DEMADEX Take 40 mg by mouth daily.   warfarin 4 MG tablet Commonly known as:  COUMADIN Take 4 mg by mouth daily.       Follow-up Information    Pearson Grippe, MD. Schedule an appointment as soon as possible for a visit in 1 week(s).   Specialty:  Internal Medicine Contact information: [address] [place] [address] [phone]        Tonny Bollman, MD .   Specialty:  Cardiology Contact information: [phone] [address] [place] [address] [phone]        Flo Shanks, MD. Schedule an appointment as soon as possible for a visit in 1 week(s).   Specialty:  Otolaryngology Why:  persistant hoarseness Contact information: [address] [place] [address] [phone]           Major procedures and Radiology Reports - PLEASE review detailed and final reports thoroughly  -        Ct Chest Wo Contrast  Result Date: 12/06/2017 CLINICAL DATA:  Persistent shortness of breath. EXAM: CT CHEST WITHOUT CONTRAST TECHNIQUE: Multidetector CT imaging of the chest was performed following the standard protocol without IV contrast. COMPARISON:  Chest radiograph 12/06/2017 FINDINGS: Cardiovascular: Enlarged heart. No sizable pericardial effusion. Post aortic valve replacement. Dual lead cardiac pacemaker in appropriate position. Calcific atherosclerotic disease and tortuosity of the aorta Mediastinum/Nodes: Mild mediastinal lymphadenopathy. Index left prevascular lymph node measures 1.3 cm in short axis. Precarinal lymph node measures 1.2 cm in short axis. Moderate in size hiatal hernia. Lungs/Pleura: Diffusely scattered patchy bilateral areas of airspace consolidation. More confluent airspace consolidation versus atelectasis in bilateral lung  bases. Bilateral small pleural effusions. Upper Abdomen: No acute abnormality. Musculoskeletal: Spondylosis of the thoracic spine. IMPRESSION: Diffusely scattered patchy bilateral areas of airspace consolidation, with more confluent consolidation in bilateral lung bases. Findings most consistent with multifocal pneumonia, with superimposed atelectasis in the lung bases. Asymmetric pulmonary edema is a secondary consideration. Small bilateral pleural effusions. Mild likely reactive mediastinal lymphadenopathy. Enlarged cardiac silhouette. Aortic Atherosclerosis (ICD10-I70.0). Electronically Signed   By: Ted Mcalpine M.D.   On: 12/06/2017 18:53   Dg Chest Port 1 View  Result Date: 12/06/2017 CLINICAL DATA:  Shortness of breath. EXAM: PORTABLE CHEST 1 VIEW COMPARISON:  12/04/2017 FINDINGS: Right-sided pacemaker unchanged. Evidence of previous aortic valve replacement unchanged. Lungs are hypoinflated demonstrate left basilar opacification likely effusion with atelectasis although infection in the left base is possible. There is interval worsening of the perihilar markings right worse than left compatible with mild asymmetric interstitial edema versus infection. Mild stable cardiomegaly. Remainder of the exam is unchanged. IMPRESSION: Left base opacification slightly worse likely small effusion with atelectasis, although infection is possible. Prominent bilateral perihilar markings right worse than left which may be due to asymmetric edema versus infection. Mild stable cardiomegaly. Electronically Signed   By: Elberta Fortis M.D.   On: 12/06/2017 08:01   Dg Chest Portable 1 View  Result Date: 12/04/2017 CLINICAL DATA:  Respiratory distress. EXAM: PORTABLE CHEST 1 VIEW COMPARISON:  Chest x-ray 11/07/2017. FINDINGS: Cardiac pacer with lead tip over the right atrium right ventricle. Prior cardiac valve replacement. Cardiomegaly. Diffuse bilateral pulmonary interstitial prominence and small bilateral pleural  effusions.  Findings consistent with CHF. IMPRESSION: 1. Cardiac pacer with lead tips over the right atrium and right ventricle. Prior cardiac valve replacement. 2. Findings suggesting congestive heart failure with bilateral pulmonary interstitial edema and bilateral pleural effusions. Electronically Signed   By: Maisie Fus  Register   On: 12/04/2017 10:56    Micro Results     Recent Results (from the past 240 hour(s))  MRSA PCR Screening     Status: Abnormal   Collection Time: 12/04/17  4:24 PM  Result Value Ref Range Status   MRSA by PCR POSITIVE (A) NEGATIVE Final    Comment:        The GeneXpert MRSA Assay (FDA approved for NASAL specimens only), is one component of a comprehensive MRSA colonization surveillance program. It is not intended to diagnose MRSA infection nor to guide or monitor treatment for MRSA infections. CRITICAL RESULT CALLED TO, READ BACK BY AND VERIFIED WITH: Donnetta Hutching, RN AT 1805 ON 12/04/17 BY C. JESSUP, MLT. Performed at [facility], [address] [place], [place] [address]     Today   Subjective    Ahlam Piscitelli today has no headache,no chest abdominal pain,no new weakness tingling or numbness, continues to have chronic hoarse voice   Objective   Blood pressure (!) 143/66, pulse 80, temperature 98.2 F (36.8 C), temperature source Oral, resp. rate 19, height 5' (1.524 m), weight 71.7 kg, SpO2 92 %.   Intake/Output Summary (Last 24 hours) at 12/08/2017 1134 Last data filed at 12/08/2017 0905 Gross per 24 hour  Intake 593.06 ml  Output 900 ml  Net -306.94 ml    Exam Awake Alert, Oriented x 3, No new F.N deficits, Normal affect [place].AT,PERRAL Supple Neck,No JVD, No cervical lymphadenopathy appriciated.  Symmetrical Chest wall movement, Good air movement bilaterally, few rales RRR,No Gallops,Rubs or new Murmurs, No Parasternal Heave +ve B.Sounds, Abd Soft, Non tender, No organomegaly appriciated, No rebound -guarding or rigidity. No  Cyanosis, Clubbing or edema, No new Rash or bruise   Data Review   CBC w Diff:  Lab Results  Component Value Date   WBC 11.3 (H) 12/08/2017   HGB 10.4 (L) 12/08/2017   HCT 32.4 (L) 12/08/2017   HCT 24.8 (L) 12/15/2014   PLT 220 12/08/2017   LYMPHOPCT 11 12/05/2017   MONOPCT 5 12/05/2017   EOSPCT 0 12/05/2017   BASOPCT 0 12/05/2017    CMP:  Lab Results  Component Value Date   NA 137 12/08/2017   NA 143 08/26/2016   K 4.0 12/08/2017   CL 95 (L) 12/08/2017   CO2 32 12/08/2017   BUN 57 (H) 12/08/2017   BUN 49 (A) 08/26/2016   CREATININE 2.26 (H) 12/08/2017   GLU 92 08/26/2016   PROT 5.8 (L) 12/06/2017   PROT 6.7 01/21/2017   ALBUMIN 3.0 (L) 12/06/2017   ALBUMIN 4.2 01/21/2017   BILITOT 1.0 12/06/2017   BILITOT 0.3 01/21/2017   ALKPHOS 48 12/06/2017   AST 29 12/06/2017   ALT 24 12/06/2017  .   Total Time in preparing paper work, data evaluation and todays exam - 35 minutes  Susa Raring M.D on 12/08/2017 at 11:34 AM  [facility]   Office  [phone]

## 2017-12-08 NOTE — Progress Notes (Signed)
ANTICOAGULATION CONSULT NOTE   Pharmacy Consult for warfarin Indication: TAVR/AFib  Allergies  Allergen Reactions  . Nubain [Nalbuphine Hcl] Hives    Went into cardiac arrest   . Codeine Hives and Nausea Only    Has tolerated Oxycodone  . Darvocet [Propoxyphene N-Acetaminophen] Nausea Only     Vital Signs: Temp: 98.2 F (36.8 C) (08/20 0550) Temp Source: Oral (08/20 0550) BP: 129/68 (08/20 0550) Pulse Rate: 84 (08/19 2329)  Labs: Recent Labs    12/06/17 0212 12/06/17 0845 12/07/17 0345 12/08/17 0553  HGB 10.1*  --  10.5* 10.4*  HCT 31.3*  --  33.0* 32.4*  PLT 264  --  245 220  LABPROT 36.7*  --  41.4* 33.7*  INR 3.74  --  4.37* 3.36  CREATININE 2.47*  --  2.42* 2.26*  TROPONINI  --  0.23*  --   --     Estimated Creatinine Clearance: 16.7 mL/min (A) (by C-G formula based on SCr of 2.26 mg/dL (H)).   Medical History: Past Medical History:  Diagnosis Date  . Acute on chronic renal failure [facility])    sees Dr Allena Katz   . Anemia    Acute blood loss  . Aortic regurgitation   . Aortic stenosis 10/13/2012   Low EF, low gradient with severe aortic stenosis confirmed by dobutamine stress echocardiogram s/p TAVR 12/2012  . Asthma   . Atrial fibrillation (HCC)    tachy-brady syndrome with <1% recurrent PAF since pacemaker placement  . Cataracts, bilateral   . Chronic combined systolic and diastolic CHF (congestive heart failure) (HCC)   . Chronic lower back pain   . CKD (chronic kidney disease)   . Coronary artery disease involving native coronary artery of native heart   . Dementia    Without behavioral disturbance  . Dysrhythmia   . Fibromyalgia   . Gastroesophageal reflux disease   . H/O dizziness   . H/O urinary frequency   . H/O: stroke   . Hard of hearing   . Headache    hx of migraines   . Heart murmur   . History of blood transfusion   . History of bronchitis   . History of kidney stones   . History of urinary tract infection   . HLD (hyperlipidemia)    . HTN (hypertension)   . Hypothyroidism   . Neuropathy   . Nonischemic cardiomyopathy (HCC)   . On home oxygen therapy    patient uses at nite- 2L- has not used in > 6 months per patient  . Osteoarthritis   . Osteoporosis   . Pneumonia    hx of x 3   . PONV (postoperative nausea and vomiting)   . Presence of permanent cardiac pacemaker   . Pulmonary embolism (HCC)    HISTORY OF, the pt. had a recurrent bilateral pulmonary emboli in 2005, on warfarin therapy and at which time she under went implantation of IVC filter  . Repeated falls   . Rupture of right patellar tendon   . Sciatica   . Shortness of breath dyspnea    with exertion   . Sleep apnea    uses oxygen at night and PRN- not used since > 6 months / DOES NOT USE  C-PAP  . Spinal stenosis   . Stroke (HCC)   . Symptomatic bradycardia   . Symptomatic bradycardia 2012   s/p [employer] PPM  . Syncope   . Urinary incontinence   . Urinary urgency    Assessment:  29 yoF on warfarin PTA for TAVR and Afib . Last dose PTA 8/15. INR on admit 4.65. PTA regimen per nursing home is 4 mg daily.   INR today decreased from 4.37 to 3.36. Hgb low stable. Plt wnl. On amiodarone (on PTA). On cefepime for HCAP - can impact INR sensitivity.    Goal of Therapy:  INR 2-3 Monitor platelets by anticoagulation protocol: Yes   Plan:  Continue to hold warfarin given recent INR trends  Anticipate INR may return to therapeutic range tomorrow. Would recommend an INR check at [facility] tomorrow prior to resuming warfain dose Daily INR/CBC Monitor for s/sx of bleeding  Vinnie Level, PharmD., BCPS Clinical Pharmacist Clinical phone for 12/08/17 until 3:30pm: [phone] If after 3:30pm, please refer to [facility] for unit-specific pharmacist

## 2017-12-08 NOTE — Telephone Encounter (Signed)
Spoke with pt's son, Onalee Hua. He is aware of Dr. Shirlee More response. States that he will contact us once she is discharged and we get her scheduled for a HFU. Nothing further was needed.

## 2017-12-08 NOTE — Progress Notes (Signed)
12/08/2017 3:57 PM Report called to [facility]. Kathryne Hitch

## 2017-12-08 NOTE — Progress Notes (Signed)
Clinical Social Worker facilitated patient discharge including contacting patient family and facility to confirm patient discharge plans.  Clinical information faxed to facility and family agreeable with plan.  CSW arranged ambulance transport via [facility] to [facility] .  RN to call [phone] for report prior to discharge.  Clinical Social Worker will sign off for now as social work intervention is no longer needed. Please consult us again if new need arises.  Ritaj Dullea, MSW, LCSWA [phone]  

## 2017-12-08 NOTE — Progress Notes (Signed)
12/08/2017 3:48 PM Pt transported back to [facility] per [facility]..  Will call report to [facility]. Kathryne Hitch

## 2017-12-08 NOTE — Care Management Important Message (Signed)
Important Message  Patient Details  Name: [name] MRN: [id number] Date of Birth: 1932/06/28   Medicare Important Message Given:       Lawerance Sabal, RN 12/08/2017, 12:02 PM

## 2017-12-08 NOTE — Care Management Note (Signed)
Case Management Note  Patient Details  Name: TYHESHA HOOPER MRN: [id number] Date of Birth: 01-26-1933  Subjective/Objective:         Patient admitted for TAVR.            Action/Plan:  Patient will return to SNF at DC. Will transport via [facility]. Attempted to arrange portable O2 tank so she could avoid [facility] and travel w son to SNF at DC, however this is not a service that is available through the DME companies unless she is an active account. CSW will update patient and son that she will DC through [facility] to SNF, and arrange.    Expected Discharge Date:                  Expected Discharge Plan:  Skilled Nursing Facility  In-House Referral:  Clinical Social Work  Discharge planning Services  CM Consult  Post Acute Care Choice:    Choice offered to:     DME Arranged:    DME Agency:     HH Arranged:    HH Agency:     Status of Service:  Completed, signed off  If discussed at [employer], dates discussed:    Additional Comments:  Lawerance Sabal, RN 12/08/2017, 10:49 AM

## 2017-12-08 NOTE — Discharge Instructions (Signed)
Follow with Primary MD Pearson Grippe, MD in 7 days   Get CBC, CMP,  INR checked by Primary MD in 2 days   Activity: As tolerated with Full fall precautions use walker/cane & assistance as needed  Disposition SNF  Diet:   Heart Healthy   Check your Weight same time everyday, if you gain over 2 pounds, or you develop in leg swelling, experience more shortness of breath or chest pain, call your Primary MD immediately. Follow Cardiac Low Salt Diet and 1.5 lit/day fluid restriction.  Special Instructions: If you have smoked or chewed Tobacco  in the last 2 yrs please stop smoking, stop any regular Alcohol  and or any Recreational drug use.  On your next visit with your primary care physician please Get Medicines reviewed and adjusted.  Please request your Prim.MD to go over all Hospital Tests and Procedure/Radiological results at the follow up, please get all Hospital records sent to your Prim MD by signing hospital release before you go home.  If you experience worsening of your admission symptoms, develop shortness of breath, life threatening emergency, suicidal or homicidal thoughts you must seek medical attention immediately by calling 911 or calling your MD immediately  if symptoms less severe.  You Must read complete instructions/literature along with all the possible adverse reactions/side effects for all the Medicines you take and that have been prescribed to you. Take any new Medicines after you have completely understood and accpet all the possible adverse reactions/side effects.

## 2017-12-08 NOTE — Care Management Important Message (Signed)
Important Message  Patient Details  Name: [name] MRN: [id number] Date of Birth: Jul 01, 1932   Medicare Important Message Given:  Yes    Lawerance Sabal, RN 12/08/2017, 12:03 PM

## 2017-12-08 NOTE — Telephone Encounter (Signed)
We have not been consulted by the hospitality team so were not aware of her hospitalization. I am happy to see her today for a social visit. Ensure she has post hospitalization follow up with me or NP in clinic.

## 2017-12-17 ENCOUNTER — Inpatient Hospital Stay: Payer: Medicare Other | Admitting: Nurse Practitioner

## 2017-12-18 ENCOUNTER — Encounter: Payer: Self-pay | Admitting: Nurse Practitioner

## 2017-12-18 ENCOUNTER — Ambulatory Visit (INDEPENDENT_AMBULATORY_CARE_PROVIDER_SITE_OTHER): Payer: Medicare Other | Admitting: Nurse Practitioner

## 2017-12-18 ENCOUNTER — Ambulatory Visit (INDEPENDENT_AMBULATORY_CARE_PROVIDER_SITE_OTHER)
Admission: RE | Admit: 2017-12-18 | Discharge: 2017-12-18 | Disposition: A | Discharge status: Home / Self Care | Payer: Medicare Other | Source: Ambulatory Visit | Attending: Nurse Practitioner | Admitting: Nurse Practitioner

## 2017-12-18 VITALS — BP 112/40 | HR 62 | Ht <= 58 in | Wt 157.8 lb

## 2017-12-18 DIAGNOSIS — J9621 Acute and chronic respiratory failure with hypoxia: Secondary | ICD-10-CM

## 2017-12-18 DIAGNOSIS — J189 Pneumonia, unspecified organism: Secondary | ICD-10-CM | POA: Insufficient documentation

## 2017-12-18 NOTE — Patient Instructions (Addendum)
Will check chest x ray today shows good improvement Deep breathing exercises encouraged Continue current medications Complete doxycycline Please keep follow up with Cardiology Please keep follow up with ENT Please keep upcoming follow up with Dr. Isaiah Serge on Sept 9th Please send patient to ED with any worsening condition

## 2017-12-18 NOTE — Assessment & Plan Note (Signed)
Finish doxycycline Chest x ray today shows good improvement Deep breathing exercises Nebs as needed

## 2017-12-18 NOTE — Progress Notes (Signed)
@Patient  ID: [name], female    DOB: 05-14-1932, 84 y.o.   MRN: [id number]  Chief Complaint  Patient presents with  . Hospitalization Follow-up    Referring provider: Pearson Grippe, MD   HPI   84 year old female with nonischemic cardiomyopathy, chronic systolic heart failure, severe AF status post TAVR, a-fib, STAGE IV CKD, dementia followed by Dr. Isaiah Serge.  Tests: Chest xray 12/18/17 - Improved pulmonary edema, decreased now trace left pleural effusion Chest CT 12/06/17 - Diffusely scattered patchy bilateral areas of airspace consolidation, with more confluent consolidation in bilateral lung bases. Findings most consistent with multifocal pneumonia, with superimposed atelectasis in the lung bases. Asymmetric pulmonary edema is a secondary consideration. Small bilateral pleural effusions. HRCT 11/03/17 - Image quality is degraded by respiratory motion and expiratory phase imaging. No definitive evidence of interstitial lung disease. FENO 10/26/2017-unable to complete PFTs 12/03/2012 FVC 2 [101%], FEV1 1.46 [100%], F/F 73, TLC 9 9%, DLCO 52%, DLCO/VA 70% Moderate-severe diffusion defect  OV 12/18/17 - Hospital follow up 12/04/17 - 12/08/2017  Patient was admitted to the hospital with sudden onset of shortness of breath from SNF. She was diagnosed with acute on chronic hypoxic respiratory failure likely secondary to acute decompensated systolic heart failure (EF 25-30% BY TTE on 11/08/2017). She improved after IV Lasix. Upon admission she also had CT which showed possible atypical PNA and was treated with azithromycin and third-generation cephalosporin. She was discharged back to SNF with Continuous O2 at 2L [place], daily weights and PRN Zaroxolyn for weight gain more than 2.5 pounds, edema or rails.   Since discharge she had a chest x ray at SNF that showed worsening PNA. She was given a course of Doxycycline which will be complete on 12-23-17. She has been stable since discharge. She still  has ongoing cough. O2 at 2L continuously. States that she is still very weak. She denies any leg swelling or fever. She is short of breath with activity. Chest x ray in office today shows good improvement.     Allergies  Allergen Reactions  . Nubain [Nalbuphine Hcl] Hives    Went into cardiac arrest   . Codeine Hives and Nausea Only    Has tolerated Oxycodone  . Darvocet [Propoxyphene N-Acetaminophen] Nausea Only    Immunization History  Administered Date(s) Administered  . PPD Test 06/29/2015, 07/30/2016    Past Medical History:  Diagnosis Date  . Acute on chronic renal failure [facility])    sees Dr Allena Katz   . Anemia    Acute blood loss  . Aortic regurgitation   . Aortic stenosis 10/13/2012   Low EF, low gradient with severe aortic stenosis confirmed by dobutamine stress echocardiogram s/p TAVR 12/2012  . Asthma   . Atrial fibrillation (HCC)    tachy-brady syndrome with <1% recurrent PAF since pacemaker placement  . Cataracts, bilateral   . Chronic combined systolic and diastolic CHF (congestive heart failure) (HCC)   . Chronic lower back pain   . CKD (chronic kidney disease)   . Coronary artery disease involving native coronary artery of native heart   . Dementia    Without behavioral disturbance  . Dysrhythmia   . Fibromyalgia   . Gastroesophageal reflux disease   . H/O dizziness   . H/O urinary frequency   . H/O: stroke   . Hard of hearing   . Headache    hx of migraines   . Heart murmur   . History of blood transfusion   .  History of bronchitis   . History of kidney stones   . History of urinary tract infection   . HLD (hyperlipidemia)   . HTN (hypertension)   . Hypothyroidism   . Neuropathy   . Nonischemic cardiomyopathy (HCC)   . On home oxygen therapy    patient uses at nite- 2L- has not used in > 6 months per patient  . Osteoarthritis   . Osteoporosis   . Pneumonia    hx of x 3   . PONV (postoperative nausea and vomiting)   . Presence of permanent  cardiac pacemaker   . Pulmonary embolism (HCC)    HISTORY OF, the pt. had a recurrent bilateral pulmonary emboli in 2005, on warfarin therapy and at which time she under went implantation of IVC filter  . Repeated falls   . Rupture of right patellar tendon   . Sciatica   . Shortness of breath dyspnea    with exertion   . Sleep apnea    uses oxygen at night and PRN- not used since > 6 months / DOES NOT USE  C-PAP  . Spinal stenosis   . Stroke (HCC)   . Symptomatic bradycardia   . Symptomatic bradycardia 2012   s/p Medtronic PPM  . Syncope   . Urinary incontinence   . Urinary urgency     Tobacco History: Social History   Tobacco Use  Smoking Status Never Smoker  Smokeless Tobacco Never Used   Counseling given: Not Answered   Outpatient Encounter Medications as of 12/18/2017  Medication Sig  . allopurinol (ZYLOPRIM) 100 MG tablet Take 100 mg by mouth daily.  Marland Kitchen atorvastatin (LIPITOR) 10 MG tablet Take 10 mg by mouth daily at 6 PM.   . benzonatate (TESSALON) 100 MG capsule Take 100 mg by mouth 2 (two) times daily as needed for cough.  . bisacodyl (DULCOLAX) 10 MG suppository Place 1 suppository (10 mg total) rectally daily as needed for moderate constipation.  . calcitRIOL (ROCALTROL) 0.25 MCG capsule Take 0.25 mcg by mouth daily.   . cholecalciferol (VITAMIN D) 1000 units tablet Take 2,000 Units by mouth at bedtime.  . cycloSPORINE (RESTASIS) 0.05 % ophthalmic emulsion Place 1 drop into both eyes 2 (two) times daily.   Marland Kitchen docusate sodium (COLACE) 100 MG capsule Take 1 capsule (100 mg total) by mouth 2 (two) times daily.  Marland Kitchen donepezil (ARICEPT) 10 MG tablet Take 10 mg by mouth at bedtime.   . DULoxetine (CYMBALTA) 60 MG capsule Take 60 mg by mouth daily at 6 PM.   . ferrous sulfate 325 (65 FE) MG tablet Take 325 mg by mouth daily.   Marland Kitchen gabapentin (NEURONTIN) 100 MG capsule Take 200 mg by mouth 2 (two) times daily.  Marland Kitchen HYDROcodone-acetaminophen (NORCO/VICODIN) 5-325 MG tablet Take 1  tablet by mouth every 4 (four) hours as needed for severe pain.  Marland Kitchen ipratropium-albuterol (DUONEB) 0.5-2.5 (3) MG/3ML SOLN Take 3 mLs by nebulization every 6 (six) hours as needed (wheezing/shortness of breath).  Marland Kitchen levothyroxine (SYNTHROID, LEVOTHROID) 100 MCG tablet Take 1 tablet (100 mcg total) by mouth daily before breakfast.  . lidocaine (LIDODERM) 5 % Place 1 patch onto the skin daily. Apply to lower back as needed for pain.  Marland Kitchen linaclotide (LINZESS) 290 MCG CAPS capsule Take 290 mcg by mouth daily.   . Menthol, Topical Analgesic, (BIOFREEZE) 4 % GEL Apply 1 application topically 2 (two) times daily.  . metolazone (ZAROXOLYN) 2.5 MG tablet 1 pill every Wednesday as needed once a  week for weight gain over 2.5 pounds, edema or shortness of breath.  . metoprolol tartrate (LOPRESSOR) 25 MG tablet Take 0.5 tablets (12.5 mg total) by mouth 2 (two) times daily.  . mineral oil enema Place 1 enema rectally once. Administer if KUB is positive.  . multivitamin (RENA-VIT) TABS tablet Take 1 tablet by mouth daily.   . mupirocin ointment (BACTROBAN) 2 % Place 1 application into the nose 2 (two) times daily.  . nitroGLYCERIN (NITROSTAT) 0.4 MG SL tablet Place 0.4 mg under the tongue every 5 (five) minutes as needed for chest pain (MAX 3 TABLETS).   . polyethylene glycol (MIRALAX / GLYCOLAX) packet Take 17 g by mouth daily.  Marland Kitchen torsemide (DEMADEX) 20 MG tablet Take 40 mg by mouth daily.   Marland Kitchen warfarin (COUMADIN) 4 MG tablet Take 4 mg by mouth daily.   Marland Kitchen amiodarone (PACERONE) 100 MG tablet Take 100 mg by mouth daily.  . isosorbide mononitrate (IMDUR) 30 MG 24 hr tablet Take 1 tablet (30 mg total) by mouth daily.  . [DISCONTINUED] azithromycin (ZITHROMAX) 500 MG tablet Take 1 tablet (500 mg total) by mouth daily. 4 more doses (Patient not taking: Reported on 12/18/2017)   Facility-Administered Encounter Medications as of 12/18/2017  Medication  . sodium bicarbonate first hour bolus via infusion     Review of  Systems  Review of Systems  Constitutional: Positive for chills and fatigue. Negative for fever.  HENT: Negative.   Respiratory: Positive for cough and shortness of breath.   Cardiovascular: Negative.  Negative for chest pain and leg swelling.  Gastrointestinal: Negative.   Allergic/Immunologic: Negative.   Neurological: Negative.   Psychiatric/Behavioral: Negative.        Physical Exam  BP (!) 112/40 (BP Location: Right Arm, Patient Position: Sitting, Cuff Size: Normal)   Pulse 62   Ht 4\' 9"  (1.448 m)   Wt 157 lb 12.8 oz (71.6 kg)   SpO2 97% Comment: On 2 liters of 02  BMI 34.15 kg/m   Wt Readings from Last 5 Encounters:  12/18/17 157 lb 12.8 oz (71.6 kg)  12/08/17 158 lb 1.1 oz (71.7 kg)  11/12/17 167 lb 1.7 oz (75.8 kg)  10/26/17 159 lb (72.1 kg)  01/21/17 165 lb (74.8 kg)     Physical Exam  Constitutional: She is oriented to person, place, and time. No distress.  Cardiovascular: Normal rate and regular rhythm.  Pulmonary/Chest: Effort normal and breath sounds normal. She has no wheezes. She has no rales.  O2 at 2L [place]  Neurological: She is alert and oriented to person, place, and time.  Psychiatric: She has a normal mood and affect.  Nursing note and vitals reviewed.   Imaging: Dg Chest 2 View  Result Date: 12/18/2017 CLINICAL DATA:  Increased shortness of breath and cough for the past 2 weeks. EXAM: CHEST - 2 VIEW COMPARISON:  Chest x-ray and CT chest dated December 06, 2017. FINDINGS: Unchanged right chest wall pacemaker. Stable cardiomegaly status post TAVR. Persistent pulmonary vascular congestion and mild interstitial edema, with slightly improved asymmetric hazy opacities involving the right lung. Trace left pleural effusion, decreased when compared to prior study. Improved aeration of the left lower lobe. No pneumothorax. No acute osseous abnormality. IMPRESSION: 1. Improved now mild pulmonary edema. 2. Decreased now trace left pleural effusion with improved  aeration of the left lower lobe. Electronically Signed   By: Obie Dredge M.D.   On: 12/18/2017 15:00   Ct Chest Wo Contrast  Result Date: 12/06/2017 CLINICAL  DATA:  Persistent shortness of breath. EXAM: CT CHEST WITHOUT CONTRAST TECHNIQUE: Multidetector CT imaging of the chest was performed following the standard protocol without IV contrast. COMPARISON:  Chest radiograph 12/06/2017 FINDINGS: Cardiovascular: Enlarged heart. No sizable pericardial effusion. Post aortic valve replacement. Dual lead cardiac pacemaker in appropriate position. Calcific atherosclerotic disease and tortuosity of the aorta Mediastinum/Nodes: Mild mediastinal lymphadenopathy. Index left prevascular lymph node measures 1.3 cm in short axis. Precarinal lymph node measures 1.2 cm in short axis. Moderate in size hiatal hernia. Lungs/Pleura: Diffusely scattered patchy bilateral areas of airspace consolidation. More confluent airspace consolidation versus atelectasis in bilateral lung bases. Bilateral small pleural effusions. Upper Abdomen: No acute abnormality. Musculoskeletal: Spondylosis of the thoracic spine. IMPRESSION: Diffusely scattered patchy bilateral areas of airspace consolidation, with more confluent consolidation in bilateral lung bases. Findings most consistent with multifocal pneumonia, with superimposed atelectasis in the lung bases. Asymmetric pulmonary edema is a secondary consideration. Small bilateral pleural effusions. Mild likely reactive mediastinal lymphadenopathy. Enlarged cardiac silhouette. Aortic Atherosclerosis (ICD10-I70.0). Electronically Signed   By: Ted Mcalpine M.D.   On: 12/06/2017 18:53   Dg Chest Port 1 View  Result Date: 12/06/2017 CLINICAL DATA:  Shortness of breath. EXAM: PORTABLE CHEST 1 VIEW COMPARISON:  12/04/2017 FINDINGS: Right-sided pacemaker unchanged. Evidence of previous aortic valve replacement unchanged. Lungs are hypoinflated demonstrate left basilar opacification likely  effusion with atelectasis although infection in the left base is possible. There is interval worsening of the perihilar markings right worse than left compatible with mild asymmetric interstitial edema versus infection. Mild stable cardiomegaly. Remainder of the exam is unchanged. IMPRESSION: Left base opacification slightly worse likely small effusion with atelectasis, although infection is possible. Prominent bilateral perihilar markings right worse than left which may be due to asymmetric edema versus infection. Mild stable cardiomegaly. Electronically Signed   By: Elberta Fortis M.D.   On: 12/06/2017 08:01   Dg Chest Portable 1 View  Result Date: 12/04/2017 CLINICAL DATA:  Respiratory distress. EXAM: PORTABLE CHEST 1 VIEW COMPARISON:  Chest x-ray 11/07/2017. FINDINGS: Cardiac pacer with lead tip over the right atrium right ventricle. Prior cardiac valve replacement. Cardiomegaly. Diffuse bilateral pulmonary interstitial prominence and small bilateral pleural effusions. Findings consistent with CHF. IMPRESSION: 1. Cardiac pacer with lead tips over the right atrium and right ventricle. Prior cardiac valve replacement. 2. Findings suggesting congestive heart failure with bilateral pulmonary interstitial edema and bilateral pleural effusions. Electronically Signed   By: Maisie Fus  Register   On: 12/04/2017 10:56     Assessment & Plan:   Acute on chronic respiratory failure with hypoxia [facility]) Patient Instructions  Chest x ray today shows good improvement Deep breathing exercises encouraged Continue current medications Complete doxycycline Please keep follow up with Cardiology Please keep follow up with ENT Please keep upcoming follow up with Dr. Isaiah Serge on Sept 9th Please send patient to ED with any worsening condition    Pneumonia due to infectious organism Finish doxycycline Chest x ray today shows good improvement Deep breathing exercises Nebs as needed     Ivonne Andrew,  NP 12/18/2017

## 2017-12-18 NOTE — Assessment & Plan Note (Signed)
Patient Instructions  Chest x ray today shows good improvement Deep breathing exercises encouraged Continue current medications Complete doxycycline Please keep follow up with Cardiology Please keep follow up with ENT Please keep upcoming follow up with Dr. Isaiah Serge on Sept 9th Please send patient to ED with any worsening condition

## 2017-12-24 ENCOUNTER — Emergency Department (HOSPITAL_COMMUNITY): Payer: Medicare Other

## 2017-12-24 ENCOUNTER — Other Ambulatory Visit: Payer: Self-pay

## 2017-12-24 ENCOUNTER — Encounter (HOSPITAL_COMMUNITY): Payer: Self-pay | Admitting: Emergency Medicine

## 2017-12-24 ENCOUNTER — Inpatient Hospital Stay (HOSPITAL_COMMUNITY)
Admission: EM | Admit: 2017-12-24 | Discharge: 2018-01-02 | DRG: 208 | Disposition: A | Discharge status: Skilled Nursing Facility | Payer: Medicare Other | Attending: Internal Medicine | Admitting: Internal Medicine

## 2017-12-24 DIAGNOSIS — E890 Postprocedural hypothyroidism: Secondary | ICD-10-CM | POA: Diagnosis present

## 2017-12-24 DIAGNOSIS — N185 Chronic kidney disease, stage 5: Secondary | ICD-10-CM | POA: Diagnosis not present

## 2017-12-24 DIAGNOSIS — K219 Gastro-esophageal reflux disease without esophagitis: Secondary | ICD-10-CM | POA: Diagnosis present

## 2017-12-24 DIAGNOSIS — E872 Acidosis, unspecified: Secondary | ICD-10-CM

## 2017-12-24 DIAGNOSIS — I248 Other forms of acute ischemic heart disease: Secondary | ICD-10-CM

## 2017-12-24 DIAGNOSIS — E876 Hypokalemia: Secondary | ICD-10-CM | POA: Diagnosis not present

## 2017-12-24 DIAGNOSIS — Z952 Presence of prosthetic heart valve: Secondary | ICD-10-CM | POA: Diagnosis not present

## 2017-12-24 DIAGNOSIS — M797 Fibromyalgia: Secondary | ICD-10-CM | POA: Diagnosis present

## 2017-12-24 DIAGNOSIS — R748 Abnormal levels of other serum enzymes: Secondary | ICD-10-CM | POA: Diagnosis not present

## 2017-12-24 DIAGNOSIS — I495 Sick sinus syndrome: Secondary | ICD-10-CM | POA: Diagnosis present

## 2017-12-24 DIAGNOSIS — J9621 Acute and chronic respiratory failure with hypoxia: Principal | ICD-10-CM | POA: Diagnosis present

## 2017-12-24 DIAGNOSIS — Z950 Presence of cardiac pacemaker: Secondary | ICD-10-CM

## 2017-12-24 DIAGNOSIS — N170 Acute kidney failure with tubular necrosis: Secondary | ICD-10-CM | POA: Diagnosis not present

## 2017-12-24 DIAGNOSIS — Z66 Do not resuscitate: Secondary | ICD-10-CM | POA: Diagnosis present

## 2017-12-24 DIAGNOSIS — M810 Age-related osteoporosis without current pathological fracture: Secondary | ICD-10-CM | POA: Diagnosis present

## 2017-12-24 DIAGNOSIS — J9601 Acute respiratory failure with hypoxia: Secondary | ICD-10-CM

## 2017-12-24 DIAGNOSIS — G4733 Obstructive sleep apnea (adult) (pediatric): Secondary | ICD-10-CM | POA: Diagnosis present

## 2017-12-24 DIAGNOSIS — N171 Acute kidney failure with acute cortical necrosis: Secondary | ICD-10-CM | POA: Diagnosis not present

## 2017-12-24 DIAGNOSIS — I428 Other cardiomyopathies: Secondary | ICD-10-CM | POA: Diagnosis present

## 2017-12-24 DIAGNOSIS — D631 Anemia in chronic kidney disease: Secondary | ICD-10-CM | POA: Diagnosis present

## 2017-12-24 DIAGNOSIS — R0602 Shortness of breath: Secondary | ICD-10-CM

## 2017-12-24 DIAGNOSIS — I480 Paroxysmal atrial fibrillation: Secondary | ICD-10-CM | POA: Diagnosis present

## 2017-12-24 DIAGNOSIS — N184 Chronic kidney disease, stage 4 (severe): Secondary | ICD-10-CM | POA: Diagnosis present

## 2017-12-24 DIAGNOSIS — R042 Hemoptysis: Secondary | ICD-10-CM | POA: Diagnosis present

## 2017-12-24 DIAGNOSIS — Z90710 Acquired absence of both cervix and uterus: Secondary | ICD-10-CM

## 2017-12-24 DIAGNOSIS — G9340 Encephalopathy, unspecified: Secondary | ICD-10-CM | POA: Diagnosis not present

## 2017-12-24 DIAGNOSIS — J9622 Acute and chronic respiratory failure with hypercapnia: Secondary | ICD-10-CM | POA: Diagnosis present

## 2017-12-24 DIAGNOSIS — Z7901 Long term (current) use of anticoagulants: Secondary | ICD-10-CM

## 2017-12-24 DIAGNOSIS — R339 Retention of urine, unspecified: Secondary | ICD-10-CM | POA: Diagnosis not present

## 2017-12-24 DIAGNOSIS — Z820 Family history of epilepsy and other diseases of the nervous system: Secondary | ICD-10-CM

## 2017-12-24 DIAGNOSIS — Z86711 Personal history of pulmonary embolism: Secondary | ICD-10-CM

## 2017-12-24 DIAGNOSIS — I959 Hypotension, unspecified: Secondary | ICD-10-CM | POA: Diagnosis not present

## 2017-12-24 DIAGNOSIS — R34 Anuria and oliguria: Secondary | ICD-10-CM | POA: Diagnosis not present

## 2017-12-24 DIAGNOSIS — Z96651 Presence of right artificial knee joint: Secondary | ICD-10-CM | POA: Diagnosis present

## 2017-12-24 DIAGNOSIS — Z992 Dependence on renal dialysis: Secondary | ICD-10-CM

## 2017-12-24 DIAGNOSIS — Z515 Encounter for palliative care: Secondary | ICD-10-CM | POA: Diagnosis not present

## 2017-12-24 DIAGNOSIS — N186 End stage renal disease: Secondary | ICD-10-CM | POA: Diagnosis not present

## 2017-12-24 DIAGNOSIS — I5033 Acute on chronic diastolic (congestive) heart failure: Secondary | ICD-10-CM

## 2017-12-24 DIAGNOSIS — Z8673 Personal history of transient ischemic attack (TIA), and cerebral infarction without residual deficits: Secondary | ICD-10-CM

## 2017-12-24 DIAGNOSIS — M19011 Primary osteoarthritis, right shoulder: Secondary | ICD-10-CM | POA: Diagnosis present

## 2017-12-24 DIAGNOSIS — J9690 Respiratory failure, unspecified, unspecified whether with hypoxia or hypercapnia: Secondary | ICD-10-CM | POA: Diagnosis present

## 2017-12-24 DIAGNOSIS — D509 Iron deficiency anemia, unspecified: Secondary | ICD-10-CM | POA: Diagnosis present

## 2017-12-24 DIAGNOSIS — M545 Low back pain: Secondary | ICD-10-CM | POA: Diagnosis present

## 2017-12-24 DIAGNOSIS — I700 Atherosclerosis of aorta: Secondary | ICD-10-CM | POA: Diagnosis present

## 2017-12-24 DIAGNOSIS — Z87440 Personal history of urinary (tract) infections: Secondary | ICD-10-CM

## 2017-12-24 DIAGNOSIS — F0390 Unspecified dementia without behavioral disturbance: Secondary | ICD-10-CM | POA: Diagnosis present

## 2017-12-24 DIAGNOSIS — J440 Chronic obstructive pulmonary disease with acute lower respiratory infection: Secondary | ICD-10-CM | POA: Diagnosis present

## 2017-12-24 DIAGNOSIS — E039 Hypothyroidism, unspecified: Secondary | ICD-10-CM | POA: Diagnosis present

## 2017-12-24 DIAGNOSIS — I2510 Atherosclerotic heart disease of native coronary artery without angina pectoris: Secondary | ICD-10-CM | POA: Diagnosis present

## 2017-12-24 DIAGNOSIS — G8929 Other chronic pain: Secondary | ICD-10-CM | POA: Diagnosis present

## 2017-12-24 DIAGNOSIS — Z7189 Other specified counseling: Secondary | ICD-10-CM

## 2017-12-24 DIAGNOSIS — E559 Vitamin D deficiency, unspecified: Secondary | ICD-10-CM | POA: Diagnosis present

## 2017-12-24 DIAGNOSIS — J9602 Acute respiratory failure with hypercapnia: Secondary | ICD-10-CM | POA: Diagnosis not present

## 2017-12-24 DIAGNOSIS — I509 Heart failure, unspecified: Secondary | ICD-10-CM

## 2017-12-24 DIAGNOSIS — I5043 Acute on chronic combined systolic (congestive) and diastolic (congestive) heart failure: Secondary | ICD-10-CM | POA: Diagnosis present

## 2017-12-24 DIAGNOSIS — Z9849 Cataract extraction status, unspecified eye: Secondary | ICD-10-CM

## 2017-12-24 DIAGNOSIS — J9620 Acute and chronic respiratory failure, unspecified whether with hypoxia or hypercapnia: Secondary | ICD-10-CM | POA: Diagnosis not present

## 2017-12-24 DIAGNOSIS — J181 Lobar pneumonia, unspecified organism: Secondary | ICD-10-CM | POA: Diagnosis present

## 2017-12-24 DIAGNOSIS — Z9289 Personal history of other medical treatment: Secondary | ICD-10-CM

## 2017-12-24 DIAGNOSIS — I130 Hypertensive heart and chronic kidney disease with heart failure and stage 1 through stage 4 chronic kidney disease, or unspecified chronic kidney disease: Secondary | ICD-10-CM | POA: Diagnosis present

## 2017-12-24 DIAGNOSIS — M5430 Sciatica, unspecified side: Secondary | ICD-10-CM | POA: Diagnosis present

## 2017-12-24 DIAGNOSIS — Z8041 Family history of malignant neoplasm of ovary: Secondary | ICD-10-CM

## 2017-12-24 DIAGNOSIS — H9190 Unspecified hearing loss, unspecified ear: Secondary | ICD-10-CM | POA: Diagnosis present

## 2017-12-24 DIAGNOSIS — Z79890 Hormone replacement therapy (postmenopausal): Secondary | ICD-10-CM

## 2017-12-24 DIAGNOSIS — N179 Acute kidney failure, unspecified: Secondary | ICD-10-CM | POA: Diagnosis not present

## 2017-12-24 DIAGNOSIS — Z885 Allergy status to narcotic agent status: Secondary | ICD-10-CM

## 2017-12-24 DIAGNOSIS — E1122 Type 2 diabetes mellitus with diabetic chronic kidney disease: Secondary | ICD-10-CM | POA: Diagnosis present

## 2017-12-24 DIAGNOSIS — Z87442 Personal history of urinary calculi: Secondary | ICD-10-CM

## 2017-12-24 DIAGNOSIS — N189 Chronic kidney disease, unspecified: Secondary | ICD-10-CM | POA: Diagnosis not present

## 2017-12-24 DIAGNOSIS — E1140 Type 2 diabetes mellitus with diabetic neuropathy, unspecified: Secondary | ICD-10-CM | POA: Diagnosis present

## 2017-12-24 DIAGNOSIS — Z9981 Dependence on supplemental oxygen: Secondary | ICD-10-CM

## 2017-12-24 DIAGNOSIS — Z6826 Body mass index (BMI) 26.0-26.9, adult: Secondary | ICD-10-CM

## 2017-12-24 DIAGNOSIS — R011 Cardiac murmur, unspecified: Secondary | ICD-10-CM | POA: Diagnosis present

## 2017-12-24 DIAGNOSIS — R319 Hematuria, unspecified: Secondary | ICD-10-CM | POA: Diagnosis not present

## 2017-12-24 DIAGNOSIS — J810 Acute pulmonary edema: Secondary | ICD-10-CM | POA: Diagnosis not present

## 2017-12-24 DIAGNOSIS — E785 Hyperlipidemia, unspecified: Secondary | ICD-10-CM | POA: Diagnosis present

## 2017-12-24 DIAGNOSIS — Z8701 Personal history of pneumonia (recurrent): Secondary | ICD-10-CM

## 2017-12-24 LAB — CORTISOL: CORTISOL PLASMA: 27.9 ug/dL

## 2017-12-24 LAB — COMPREHENSIVE METABOLIC PANEL
ALT: 16 U/L (ref 0–44)
AST: 33 U/L (ref 15–41)
Albumin: 3 g/dL — ABNORMAL LOW (ref 3.5–5.0)
Alkaline Phosphatase: 66 U/L (ref 38–126)
Anion gap: 16 — ABNORMAL HIGH (ref 5–15)
BUN: 70 mg/dL — ABNORMAL HIGH (ref 8–23)
CO2: 27 mmol/L (ref 22–32)
CREATININE: 2.74 mg/dL — AB (ref 0.44–1.00)
Calcium: 9.5 mg/dL (ref 8.9–10.3)
Chloride: 99 mmol/L (ref 98–111)
GFR, EST AFRICAN AMERICAN: 17 mL/min — AB (ref >60)
GFR, EST NON AFRICAN AMERICAN: 15 mL/min — AB (ref >60)
Glucose, Bld: 156 mg/dL — ABNORMAL HIGH (ref 70–99)
POTASSIUM: 3.2 mmol/L — AB (ref 3.5–5.1)
SODIUM: 142 mmol/L (ref 135–145)
Total Bilirubin: 1 mg/dL (ref 0.3–1.2)
Total Protein: 5.6 g/dL — ABNORMAL LOW (ref 6.5–8.1)

## 2017-12-24 LAB — CBC
HCT: 39 % (ref 36.0–46.0)
HCT: 34.3 % — ABNORMAL LOW (ref 36.0–46.0)
Hemoglobin: 11.9 g/dL — ABNORMAL LOW (ref 12.0–15.0)
Hemoglobin: 10.7 g/dL — ABNORMAL LOW (ref 12.0–15.0)
MCH: 31.2 pg (ref 26.0–34.0)
MCH: 31.4 pg (ref 26.0–34.0)
MCHC: 30.5 g/dL (ref 30.0–36.0)
MCHC: 31.2 g/dL (ref 30.0–36.0)
MCV: 102.1 fL — ABNORMAL HIGH (ref 78.0–100.0)
MCV: 100.6 fL — ABNORMAL HIGH (ref 78.0–100.0)
PLATELETS: 483 10*3/uL — AB (ref 150–400)
PLATELETS: 335 10*3/uL (ref 150–400)
RBC: 3.82 MIL/uL — AB (ref 3.87–5.11)
RBC: 3.41 MIL/uL — AB (ref 3.87–5.11)
RDW: 14.9 % (ref 11.5–15.5)
RDW: 14.7 % (ref 11.5–15.5)
WBC: 12.5 10*3/uL — ABNORMAL HIGH (ref 4.0–10.5)
WBC: 18.6 10*3/uL — ABNORMAL HIGH (ref 4.0–10.5)

## 2017-12-24 LAB — I-STAT ARTERIAL BLOOD GAS, ED
Acid-Base Excess: 3 mmol/L — ABNORMAL HIGH (ref 0.0–2.0)
Acid-base deficit: 1 mmol/L (ref 0.0–2.0)
BICARBONATE: 26 mmol/L (ref 20.0–28.0)
BICARBONATE: 31.5 mmol/L — AB (ref 20.0–28.0)
O2 SAT: 98 %
O2 Saturation: 95 %
PCO2 ART: 63 mmHg — AB (ref 32.0–48.0)
PO2 ART: 81 mmHg — AB (ref 83.0–108.0)
PO2 ART: 117 mmHg — AB (ref 83.0–108.0)
Patient temperature: 98
Patient temperature: 97.9
TCO2: 28 mmol/L (ref 22–32)
TCO2: 33 mmol/L — AB (ref 22–32)
pCO2 arterial: 51.5 mmHg — ABNORMAL HIGH (ref 32.0–48.0)
pH, Arterial: 7.309 — ABNORMAL LOW (ref 7.350–7.450)
pH, Arterial: 7.305 — ABNORMAL LOW (ref 7.350–7.450)

## 2017-12-24 LAB — BASIC METABOLIC PANEL
ANION GAP: 20 — AB (ref 5–15)
BUN: 69 mg/dL — AB (ref 8–23)
CHLORIDE: 98 mmol/L (ref 98–111)
CO2: 23 mmol/L (ref 22–32)
Calcium: 9.5 mg/dL (ref 8.9–10.3)
Creatinine, Ser: 2.81 mg/dL — ABNORMAL HIGH (ref 0.44–1.00)
GFR calc non Af Amer: 15 mL/min — ABNORMAL LOW (ref >60)
GFR, EST AFRICAN AMERICAN: 17 mL/min — AB (ref >60)
GLUCOSE: 257 mg/dL — AB (ref 70–99)
Potassium: 3.6 mmol/L (ref 3.5–5.1)
Sodium: 141 mmol/L (ref 135–145)

## 2017-12-24 LAB — TSH: TSH: 8.453 u[IU]/mL — ABNORMAL HIGH (ref 0.350–4.500)

## 2017-12-24 LAB — I-STAT CG4 LACTIC ACID, ED: Lactic Acid, Venous: 6.9 mmol/L (ref 0.5–1.9)

## 2017-12-24 LAB — APTT: APTT: 28 s (ref 24–36)

## 2017-12-24 LAB — TRIGLYCERIDES: Triglycerides: 196 mg/dL — ABNORMAL HIGH (ref <150)

## 2017-12-24 LAB — GLUCOSE, CAPILLARY
GLUCOSE-CAPILLARY: 133 mg/dL — AB (ref 70–99)
GLUCOSE-CAPILLARY: 85 mg/dL (ref 70–99)
GLUCOSE-CAPILLARY: 88 mg/dL (ref 70–99)
GLUCOSE-CAPILLARY: 98 mg/dL (ref 70–99)
Glucose-Capillary: 154 mg/dL — ABNORMAL HIGH (ref 70–99)

## 2017-12-24 LAB — TROPONIN I
TROPONIN I: 0.17 ng/mL — AB (ref <0.03)
Troponin I: 0.2 ng/mL (ref <0.03)
Troponin I: 0.18 ng/mL (ref <0.03)

## 2017-12-24 LAB — PHOSPHORUS: PHOSPHORUS: 4.6 mg/dL (ref 2.5–4.6)

## 2017-12-24 LAB — MAGNESIUM: MAGNESIUM: 1.5 mg/dL — AB (ref 1.7–2.4)

## 2017-12-24 LAB — LACTIC ACID, PLASMA: Lactic Acid, Venous: 2.8 mmol/L (ref 0.5–1.9)

## 2017-12-24 LAB — PROTIME-INR
INR: 1.16
PROTHROMBIN TIME: 14.7 s (ref 11.4–15.2)

## 2017-12-24 LAB — BRAIN NATRIURETIC PEPTIDE: B NATRIURETIC PEPTIDE 5: 779.1 pg/mL — AB (ref 0.0–100.0)

## 2017-12-24 LAB — MRSA PCR SCREENING: MRSA BY PCR: NEGATIVE

## 2017-12-24 LAB — PROCALCITONIN: Procalcitonin: 1.38 ng/mL

## 2017-12-24 MED ORDER — VANCOMYCIN HCL IN DEXTROSE 1-5 GM/200ML-% IV SOLN
1000.0000 mg | Freq: Once | INTRAVENOUS | Status: AC
  Administered 2017-12-24: 1000 mg via INTRAVENOUS
  Filled 2017-12-24: qty 200

## 2017-12-24 MED ORDER — MIDAZOLAM HCL 2 MG/2ML IJ SOLN
1.0000 mg | INTRAMUSCULAR | Status: DC | PRN
  Administered 2017-12-25: 1 mg via INTRAVENOUS
  Filled 2017-12-24 (×2): qty 2

## 2017-12-24 MED ORDER — SODIUM CHLORIDE 0.9 % IV SOLN: INTRAVENOUS | Status: DC

## 2017-12-24 MED ORDER — LEVOTHYROXINE SODIUM 100 MCG IV SOLR
50.0000 ug | Freq: Every day | INTRAVENOUS | Status: DC
  Administered 2017-12-24 – 2017-12-25 (×2): 50 ug via INTRAVENOUS
  Filled 2017-12-24 (×2): qty 5

## 2017-12-24 MED ORDER — SODIUM CHLORIDE 0.9 % IV SOLN
INTRAVENOUS | Status: DC | PRN
  Administered 2017-12-24: 1000 mL via INTRAVENOUS

## 2017-12-24 MED ORDER — SODIUM CHLORIDE 0.9 % IV SOLN
500.0000 mg | INTRAVENOUS | Status: DC
  Administered 2017-12-24 – 2017-12-26 (×2): 500 mg via INTRAVENOUS
  Filled 2017-12-24 (×3): qty 500

## 2017-12-24 MED ORDER — LACTATED RINGERS IV BOLUS (SEPSIS): 2000.0000 mL | Freq: Once | INTRAVENOUS | Status: DC

## 2017-12-24 MED ORDER — FENTANYL CITRATE (PF) 100 MCG/2ML IJ SOLN: 50.0000 ug | INTRAMUSCULAR | Status: DC | PRN

## 2017-12-24 MED ORDER — SODIUM CHLORIDE 0.9 % IV BOLUS: 1000.0000 mL | Freq: Once | INTRAVENOUS | Status: DC

## 2017-12-24 MED ORDER — FAMOTIDINE IN NACL 20-0.9 MG/50ML-% IV SOLN: 20.0000 mg | Freq: Two times a day (BID) | INTRAVENOUS | Status: DC

## 2017-12-24 MED ORDER — FAMOTIDINE IN NACL 20-0.9 MG/50ML-% IV SOLN
20.0000 mg | INTRAVENOUS | Status: DC
  Administered 2017-12-24 – 2017-12-26 (×3): 20 mg via INTRAVENOUS
  Filled 2017-12-24 (×3): qty 50

## 2017-12-24 MED ORDER — SODIUM CHLORIDE 0.9 % IV SOLN: 250.0000 mL | INTRAVENOUS | Status: DC | PRN

## 2017-12-24 MED ORDER — IPRATROPIUM-ALBUTEROL 0.5-2.5 (3) MG/3ML IN SOLN
3.0000 mL | Freq: Four times a day (QID) | RESPIRATORY_TRACT | Status: DC
  Administered 2017-12-24 – 2017-12-30 (×24): 3 mL via RESPIRATORY_TRACT
  Filled 2017-12-24 (×25): qty 3

## 2017-12-24 MED ORDER — MIDAZOLAM HCL 2 MG/2ML IJ SOLN
1.0000 mg | INTRAMUSCULAR | Status: DC | PRN
  Administered 2017-12-25 – 2017-12-26 (×4): 1 mg via INTRAVENOUS
  Filled 2017-12-24 (×4): qty 2

## 2017-12-24 MED ORDER — SODIUM CHLORIDE 0.9 % IV SOLN
INTRAVENOUS | Status: DC
  Administered 2017-12-24: 23:00:00 via INTRAVENOUS

## 2017-12-24 MED ORDER — ETOMIDATE 2 MG/ML IV SOLN
INTRAVENOUS | Status: AC | PRN
  Administered 2017-12-24: 20 mg via INTRAVENOUS

## 2017-12-24 MED ORDER — PROPOFOL 10 MG/ML IV BOLUS
INTRAVENOUS | Status: AC
  Filled 2017-12-24: qty 20

## 2017-12-24 MED ORDER — ROCURONIUM BROMIDE 50 MG/5ML IV SOLN
INTRAVENOUS | Status: AC | PRN
  Administered 2017-12-24: 75 mg via INTRAVENOUS

## 2017-12-24 MED ORDER — PROPOFOL 1000 MG/100ML IV EMUL
INTRAVENOUS | Status: AC
  Filled 2017-12-24: qty 100

## 2017-12-24 MED ORDER — PROPOFOL 1000 MG/100ML IV EMUL
0.0000 ug/kg/min | INTRAVENOUS | Status: DC
  Administered 2017-12-24: 25 ug/kg/min via INTRAVENOUS
  Administered 2017-12-24: 20 ug/kg/min via INTRAVENOUS
  Administered 2017-12-24: 10 ug/kg/min via INTRAVENOUS
  Administered 2017-12-24: 40 ug/kg/min via INTRAVENOUS
  Administered 2017-12-25: 35 ug/kg/min via INTRAVENOUS
  Administered 2017-12-25: 34.954 ug/kg/min via INTRAVENOUS
  Administered 2017-12-26: 40 ug/kg/min via INTRAVENOUS
  Administered 2017-12-26: 50 ug/kg/min via INTRAVENOUS
  Filled 2017-12-24 (×8): qty 100

## 2017-12-24 MED ORDER — VANCOMYCIN HCL IN DEXTROSE 1-5 GM/200ML-% IV SOLN
1000.0000 mg | INTRAVENOUS | Status: DC
  Administered 2017-12-26: 1000 mg via INTRAVENOUS
  Filled 2017-12-24 (×2): qty 200

## 2017-12-24 MED ORDER — CHLORHEXIDINE GLUCONATE 0.12% ORAL RINSE (MEDLINE KIT)
15.0000 mL | Freq: Two times a day (BID) | OROMUCOSAL | Status: DC
  Administered 2017-12-24 – 2017-12-26 (×4): 15 mL via OROMUCOSAL

## 2017-12-24 MED ORDER — ORAL CARE MOUTH RINSE
15.0000 mL | OROMUCOSAL | Status: DC
  Administered 2017-12-24 – 2017-12-26 (×19): 15 mL via OROMUCOSAL

## 2017-12-24 MED ORDER — INSULIN ASPART 100 UNIT/ML ~~LOC~~ SOLN
0.0000 [IU] | SUBCUTANEOUS | Status: DC
  Administered 2017-12-24 (×2): 2 [IU] via SUBCUTANEOUS
  Administered 2017-12-24 – 2017-12-26 (×3): 1 [IU] via SUBCUTANEOUS

## 2017-12-24 MED ORDER — SODIUM CHLORIDE 0.9 % IV SOLN
2.0000 g | Freq: Once | INTRAVENOUS | Status: AC
  Administered 2017-12-24: 2 g via INTRAVENOUS
  Filled 2017-12-24: qty 2

## 2017-12-24 MED ORDER — FENTANYL CITRATE (PF) 100 MCG/2ML IJ SOLN
50.0000 ug | INTRAMUSCULAR | Status: DC | PRN
  Administered 2017-12-24 – 2017-12-26 (×3): 50 ug via INTRAVENOUS
  Filled 2017-12-24 (×5): qty 2

## 2017-12-24 MED ORDER — SODIUM CHLORIDE 0.9 % IV SOLN
1.0000 g | INTRAVENOUS | Status: AC
  Administered 2017-12-25 – 2017-12-31 (×6): 1 g via INTRAVENOUS
  Filled 2017-12-24 (×7): qty 1

## 2017-12-24 NOTE — Progress Notes (Signed)
Pharmacy Antibiotic Note  [name] is a 84 y.o. female admitted on 12/24/2017 with sepsis.  Pharmacy has been consulted for vancomycin and cefepime dosing.Cefepimne 2gm and vancomycin 1gm ordered in the ED  Plan: Continue cefepime 1gm IV q24 hours Continue vancomycin 1gm IV q48 hours F/u renal function, cultures and clinical course  Height: 5' (152.4 cm) Weight: 158 lb 11.7 oz (72 kg) IBW/kg (Calculated) : 45.5  Temp (24hrs), Avg:98.9 F (37.2 C), Min:98.9 F (37.2 C), Max:98.9 F (37.2 C)  No results for input(s): WBC, CREATININE, LATICACIDVEN, VANCOTROUGH, VANCOPEAK, VANCORANDOM, GENTTROUGH, GENTPEAK, GENTRANDOM, TOBRATROUGH, TOBRAPEAK, TOBRARND, AMIKACINPEAK, AMIKACINTROU, AMIKACIN in the last 168 hours.  Estimated Creatinine Clearance: 16.7 mL/min (A) (by C-G formula based on SCr of 2.26 mg/dL (H)).    Allergies  Allergen Reactions  . Nubain [Nalbuphine Hcl] Hives    Went into cardiac arrest   . Codeine Hives and Nausea Only    Has tolerated Oxycodone  . Darvocet [Propoxyphene N-Acetaminophen] Nausea Only     Thank you for allowing pharmacy to be a part of this patient's care.  Talbert Cage P[place]et 12/24/2017 6:27 AM

## 2017-12-24 NOTE — Progress Notes (Signed)
Patient was transported to [facility] from the ED without any apparent complications.

## 2017-12-24 NOTE — ED Notes (Signed)
ED Provider at bedside. 

## 2017-12-24 NOTE — H&P (Signed)
[name]  ZOX:[id number] DOB: 1933-01-17 DOA: 12/24/2017 PCP: Pearson Grippe, MD    LOS: 0 days   Reason for Consult / Chief Complaint:  Acute on chronic respiratory failure  Consulting MD and date:    HPI/Summary of hospital stay:  84 year old female with extensive past medical history that is includes but not limited to O2 dependent COPD, morbid obesity, congestive heart failure with a EF of 20%, aortic stenosis severe, paroxysmal atrial fibrillation on Coumadin, dementia, chronic kidney disease with a base creatinine of 2.2, history of pulmonary embolism, who was recently discharged from [facility] on 12/07/2017 for similar episode of acute shortness of breath responding to Lasix. She returns to [facility] emergency department on 12/24/2017 again with acute shortness of breath along with thick sputum she was placed on BiPAP did not tolerate it developed frothy blood-tinged sputum and when asked if she wanted to be intubated was intubated by the emergency department physician. Pulmonary critical care was called to admit.  Subjective:  84 year old female who was a DNR prior to intubation with a history of severe aortic stenosis EF of 20% failed BiPAP and required intubation in the ER.  Objective   Blood pressure (!) 151/67, pulse (!) 102, temperature 98.9 F (37.2 C), temperature source Rectal, resp. rate 16, height 5' (1.524 m), weight 72 kg, SpO2 100 %.    Vent Mode: PRVC FiO2 (%):  [100 %] 100 % Set Rate:  [16 bmp] 16 bmp Vt Set:  [440 mL] 440 mL PEEP:  [5 cmH20] 5 cmH20 Plateau Pressure:  [20 cmH20] 20 cmH20   Intake/Output Summary (Last 24 hours) at 12/24/2017 0810 Last data filed at 12/24/2017 8119 Gross per 24 hour  Intake 100 ml  Output -  Net 100 ml   Filed Weights   12/24/17 0530  Weight: 72 kg    Examination: General: Morbidly obese elderly female who is recently been intubated and received neuromuscular blockade HENT: Endotracheal tube to ventilator,  orogastric tube to suction Lungs: Coarse rhonchi bilaterally, blood-tinged sputum noted Cardiovascular: Heart sounds are regular Abdomen: Obese soft faint bowel sounds Extremities: 1+ edema Neuro: She has received neuromuscular blockade GU: Foley catheter  Consults: date of consult/date signed off & final recs:     Procedures: 12/24/2017 intubation per emergency department physician>>  Significant Diagnostic Tests: 12/24/2017 CT of the chest pending>>  Micro Data: 12/24/2017 sputum>> 12/24/2017 blood>> 12/24/2017 urine>>  Antimicrobials:  12/24/2017 vancomycin>> 12/24/2017 Zithromax>> 12/24/2017 cefepime>> Resolved Hospital Problem list    Assessment & Plan:  Vent dependent respiratory failure secondary to acute pulmonary edema suspected overload in setting congestive heart failure with a EF of 20%.  Chronic obstructive pulmonary disease O2 dependent at 2 L daily.  Noted to have hemoptysis prior to intubation Suspected pneumonia bilateral -Vent bundle -Bronchodilators -CT of the chest has been ordered that she has a history of pulmonary embolism and is on chronic Coumadin -Antimicrobial therapy -Panculture   Congestive heart failure with EF of 20% followed by lumbar cardiology History of paroxysmal atrial fibrillation and pulmonary embolism on chronic Coumadin therapy Dual-chamber pacemaker in place Severe aortic stenosis Status post TAVR -Check INR prior to initiating anticoagulation -Once stable temp diuresis -Cardiology consult -Controlled ventricular rate as needed  Chronic kidney disease with baseline creatinine of 2.2 followed by Dr. Allena Katz -Creatinine -Diuresis as able  Diabetes mellitus -Sliding scale insulin  Anion gap acidosis ,elevated lactic acid which may be a component of sepsis versus low blood flow state. -  Trend lactic acid -Treat with antibiotic -Fluids as needed  Hypothyroidism -Check TSH  Known dementia without behavioral disturbance -Sedation as  needed  Nausea and vomiting -Gastric tube to low intermittent suction -Antiemetic as needed  Disposition / Summary of Today's Plan 12/24/17   Intubated emergency department be transferred to intensive care unit Lactic acid noted to be elevated at 6 Chest x-ray with bilateral airspace disease consistent with pneumonia and edema Return to full CODE STATUS until family meeting can be involved in change in her status back to note DNR. Once INR is complete evaluate for initiation of anticoagulation TSH has been ordered Synthroid started at 50 mics one half of  Best Practice / Goals of Care / Disposition.   DVT prophylaxis: Compression hose anticoagulation held further on Coumadin no accurate INR is available at time of admission GI prophylaxis: H2 blocker Diet: N.p.o. due to nausea and vomiting Mobility: Bedrest Code Status: Currently full code despite having a DNR sheet at bedside Family Communication: 2019 no family at bedside  Labs   CBC: Recent Labs  Lab 12/24/17 0552  WBC 12.5*  HGB 11.9*  HCT 39.0  MCV 102.1*  PLT 483*   Basic Metabolic Panel: Recent Labs  Lab 12/24/17 0552  NA 141  K 3.6  CL 98  CO2 23  GLUCOSE 257*  BUN 69*  CREATININE 2.81*  CALCIUM 9.5   GFR: Estimated Creatinine Clearance: 13.4 mL/min (A) (by C-G formula based on SCr of 2.81 mg/dL (H)). Recent Labs  Lab 12/24/17 0552 12/24/17 0617  WBC 12.5*  --   LATICACIDVEN  --  6.90*   Liver Function Tests: No results for input(s): AST, ALT, ALKPHOS, BILITOT, PROT, ALBUMIN in the last 168 hours. No results for input(s): LIPASE, AMYLASE in the last 168 hours. No results for input(s): AMMONIA in the last 168 hours. ABG    Component Value Date/Time   PHART 7.309 (L) 12/24/2017 0537   PCO2ART 51.5 (H) 12/24/2017 0537   PO2ART 81.0 (L) 12/24/2017 0537   HCO3 26.0 12/24/2017 0537   TCO2 28 12/24/2017 0537   ACIDBASEDEF 1.0 12/24/2017 0537   O2SAT 95.0 12/24/2017 0537    Coagulation  Profile: No results for input(s): INR, PROTIME in the last 168 hours. Cardiac Enzymes: No results for input(s): CKTOTAL, CKMB, CKMBINDEX, TROPONINI in the last 168 hours. HbA1C: Hgb A1c MFr Bld  Date/Time Value Ref Range Status  06/14/2016 02:44 AM 5.0 4.8 - 5.6 % Final    Comment:    (NOTE)         Pre-diabetes: 5.7 - 6.4         Diabetes: >6.4         Glycemic control for adults with diabetes: <7.0   12/15/2014 01:27 AM 5.5 4.8 - 5.6 % Final    Comment:    (NOTE)         Pre-diabetes: 5.7 - 6.4         Diabetes: >6.4         Glycemic control for adults with diabetes: <7.0    CBG: No results for input(s): GLUCAP in the last 168 hours.   Review of Systems:   Not available at time of admission Past medical history  She,  has a past medical history of Acute on chronic renal failure (HCC), Anemia, Aortic regurgitation, Aortic stenosis (10/13/2012), Asthma, Atrial fibrillation (HCC), Cataracts, bilateral, Chronic combined systolic and diastolic CHF (congestive heart failure) (HCC), Chronic lower back pain, CKD (chronic kidney disease), Coronary artery  disease involving native coronary artery of native heart, Dementia, Dysrhythmia, Fibromyalgia, Gastroesophageal reflux disease, H/O dizziness, H/O urinary frequency, H/O: stroke, Hard of hearing, Headache, Heart murmur, History of blood transfusion, History of bronchitis, History of kidney stones, History of urinary tract infection, HLD (hyperlipidemia), HTN (hypertension), Hypothyroidism, Neuropathy, Nonischemic cardiomyopathy (HCC), On home oxygen therapy, Osteoarthritis, Osteoporosis, Pneumonia, PONV (postoperative nausea and vomiting), Presence of permanent cardiac pacemaker, Pulmonary embolism (HCC), Repeated falls, Rupture of right patellar tendon, Sciatica, Shortness of breath dyspnea, Sleep apnea, Spinal stenosis, Stroke (HCC), Symptomatic bradycardia, Symptomatic bradycardia (2012), Syncope, Urinary incontinence, and Urinary urgency.    Surgical History    Past Surgical History:  Procedure Laterality Date  . ABDOMINAL HYSTERECTOMY    . APPENDECTOMY    . BACK SURGERY    . BUNIONECTOMY    . CARDIAC CATHETERIZATION    . CATARACT EXTRACTION    . CENTRAL VENOUS CATHETER INSERTION Left 12/21/2012   Procedure: INSERTION CENTRAL LINE ADULT;  Surgeon: Tonny Bollman, MD;  Location[facility] OR;  Service: Open Heart Surgery;  Laterality: Left;  . ELBOW SURGERY     bilat   . INTRAOPERATIVE TRANSESOPHAGEAL ECHOCARDIOGRAM N/A 12/21/2012   Procedure: INTRAOPERATIVE TRANSESOPHAGEAL ECHOCARDIOGRAM;  Surgeon: Tonny Bollman, MD;  Location: [facility] OR;  Service: Open Heart Surgery;  Laterality: N/A;  . KNEE SURGERY Left   . LEFT AND RIGHT HEART CATHETERIZATION WITH CORONARY ANGIOGRAM N/A 10/18/2012   Procedure: LEFT AND RIGHT HEART CATHETERIZATION WITH CORONARY ANGIOGRAM;  Surgeon: Tonny Bollman, MD;  Location: [facility] CATH LAB;  Service: Cardiovascular;  Laterality: N/A;  . NASAL SEPTUM SURGERY    . NOSE SURGERY     X 2  . ORIF PATELLA Right 03/08/2015   Procedure:  OPEN REDUCTION INTERNAL FIXATION RIGHT  PATELLA TENDON AVULSION;  Surgeon: Durene Romans, MD;  Location: [facility] ORS;  Service: Orthopedics;  Laterality: Right;  . PACEMAKER INSERTION     [employer]  . PATELLAR TENDON REPAIR Right 06/25/2015   Procedure: RIGHT PATELLA TENDON REVISION/REPAIR;  Surgeon: Durene Romans, MD;  Location: [facility] ORS;  Service: Orthopedics;  Laterality: Right;  . SHOULDER SURGERY     bilat   . THYROIDECTOMY, PARTIAL    . TONSILLECTOMY    . TOTAL KNEE ARTHROPLASTY Right 12/04/2014   Procedure: RIGHT TOTAL  KNEE ARTHROPLASTY;  Surgeon: Durene Romans, MD;  Location: [facility] ORS;  Service: Orthopedics;  Laterality: Right;  . TRANSCATHETER AORTIC VALVE REPLACEMENT, TRANSFEMORAL  12/21/2012   a. 29mm Edwards Sapien XT transcatheter heart valve placed via open left transfemoral approach b. Intra-op TEE: well-seated bioprosthetic aortic valve with mean gradient 2 mmHg, trivial AI, mild MR,  EF 30-35%  . TRANSCATHETER AORTIC VALVE REPLACEMENT, TRANSFEMORAL N/A 12/21/2012   Procedure: TRANSCATHETER AORTIC VALVE REPLACEMENT, TRANSFEMORAL;  Surgeon: Tonny Bollman, MD;  Location: [facility] OR;  Service: Open Heart Surgery;  Laterality: N/A;     Social History   Social History   Socioeconomic History  . Marital status: Divorced    Spouse name: Not on file  . Number of children: Not on file  . Years of education: Not on file  . Highest education level: Not on file  Occupational History  . Not on file  Social Needs  . Financial resource strain: Not on file  . Food insecurity:    Worry: Not on file    Inability: Not on file  . Transportation needs:    Medical: Not on file    Non-medical: Not on file  Tobacco Use  . Smoking status: Never Smoker  .  Smokeless tobacco: Never Used  Substance and Sexual Activity  . Alcohol use: No  . Drug use: No  . Sexual activity: Not Currently  Lifestyle  . Physical activity:    Days per week: Not on file    Minutes per session: Not on file  . Stress: Not on file  Relationships  . Social connections:    Talks on phone: Not on file    Gets together: Not on file    Attends religious service: Not on file    Active member of club or organization: Not on file    Attends meetings of clubs or organizations: Not on file    Relationship status: Not on file  . Intimate partner violence:    Fear of current or ex partner: Not on file    Emotionally abused: Not on file    Physically abused: Not on file    Forced sexual activity: Not on file  Other Topics Concern  . Not on file  Social History Narrative  . Not on file  ,  reports that she has never smoked. She has never used smokeless tobacco. She reports that she does not drink alcohol or use drugs.   Family history   Her family history includes Epilepsy in her father; Ovarian cancer in her mother.   Allergies Allergies  Allergen Reactions  . Nubain [Nalbuphine Hcl] Hives    Went into  cardiac arrest   . Codeine Hives and Nausea Only    Has tolerated Oxycodone  . Darvocet [Propoxyphene N-Acetaminophen] Nausea Only    Home meds  Prior to Admission medications   Medication Sig Start Date End Date Taking? Authorizing Provider  allopurinol (ZYLOPRIM) 100 MG tablet Take 100 mg by mouth daily.   Yes [provider]  aluminum-magnesium hydroxide-simethicone (MAALOX) 200-200-20 MG/5ML SUSP Take 30 mLs by mouth 3 (three) times daily as needed (for indegestion).   Yes [provider]  amiodarone (PACERONE) 100 MG tablet Take 100 mg by mouth daily.   Yes [provider]  atorvastatin (LIPITOR) 10 MG tablet Take 10 mg by mouth daily at 6 PM.    Yes [provider]  calcitRIOL (ROCALTROL) 0.25 MCG capsule Take 0.25 mcg by mouth daily.    Yes [provider]  cholecalciferol (VITAMIN D) 1000 units tablet Take 2,000 Units by mouth at bedtime.   Yes [provider]  cycloSPORINE (RESTASIS) 0.05 % ophthalmic emulsion Place 1 drop into both eyes 2 (two) times daily.    Yes [provider]  docusate sodium (COLACE) 100 MG capsule Take 1 capsule (100 mg total) by mouth 2 (two) times daily. 12/08/17  Yes Leroy Sea, MD  donepezil (ARICEPT) 10 MG tablet Take 10 mg by mouth at bedtime.    Yes [provider]  DULoxetine (CYMBALTA) 60 MG capsule Take 60 mg by mouth daily at 6 PM.    Yes [provider]  ferrous sulfate 325 (65 FE) MG tablet Take 325 mg by mouth daily.    Yes [provider]  gabapentin (NEURONTIN) 100 MG capsule Take 200 mg by mouth 2 (two) times daily.   Yes [provider]  ipratropium-albuterol (DUONEB) 0.5-2.5 (3) MG/3ML SOLN Take 3 mLs by nebulization every 6 (six) hours as needed (wheezing/shortness of breath).   Yes [provider]  ipratropium-albuterol (DUONEB) 0.5-2.5 (3) MG/3ML SOLN Take 3 mLs by nebulization 2 (two) times daily.   Yes [provider]   isosorbide mononitrate (IMDUR) 30 MG  24 hr tablet Take 1 tablet (30 mg total) by mouth daily. Patient taking differently: Take 60 mg by mouth daily.  08/05/16 12/24/25 Yes Bhagat, Bhavinkumar, PA  levothyroxine (SYNTHROID, LEVOTHROID) 100 MCG tablet Take 1 tablet (100 mcg total) by mouth daily before breakfast. 11/12/17  Yes Berton Mount I, MD  lidocaine (LIDODERM) 5 % Place 1 patch onto the skin daily. Apply to lower back as needed for pain.   Yes [provider]  linaclotide (LINZESS) 290 MCG CAPS capsule Take 290 mcg by mouth daily.    Yes [provider]  Menthol, Topical Analgesic, (BIOFREEZE) 4 % GEL Apply 1 application topically 2 (two) times daily.   Yes [provider]  metolazone (ZAROXOLYN) 2.5 MG tablet 1 pill every Wednesday as needed once a week for weight gain over 2.5 pounds, edema or shortness of breath. Patient taking differently: Take 2.5 mg by mouth See admin instructions. 1 pill every Wednesday as needed once a week for weight gain over 2.5 pounds, edema or shortness of breath. 12/08/17  Yes Leroy Sea, MD  metoprolol tartrate (LOPRESSOR) 25 MG tablet Take 0.5 tablets (12.5 mg total) by mouth 2 (two) times daily. 12/08/17  Yes Leroy Sea, MD  morphine 20 MG/5ML solution Take 10 mg by mouth every 2 (two) hours as needed for pain.   Yes [provider]  Multiple Vitamin (MULTIVITAMIN WITH MINERALS) TABS tablet Take 1 tablet by mouth daily.   Yes [provider]  mupirocin ointment (BACTROBAN) 2 % Place 1 application into the nose 2 (two) times daily. 11/12/17  Yes Barnetta Chapel, MD  nitroGLYCERIN (NITROSTAT) 0.4 MG SL tablet Place 0.4 mg under the tongue every 5 (five) minutes as needed for chest pain (MAX 3 TABLETS).    Yes [provider]  ondansetron (ZOFRAN) 4 MG tablet Take 4 mg by mouth every 8 (eight) hours as needed for nausea or vomiting.   Yes [provider]  polyethylene glycol (MIRALAX /  GLYCOLAX) packet Take 17 g by mouth daily.   Yes [provider]  torsemide (DEMADEX) 20 MG tablet Take 40 mg by mouth daily.    Yes [provider]  warfarin (COUMADIN) 3 MG tablet Take 3 mg by mouth daily.   Yes [provider]  bisacodyl (DULCOLAX) 10 MG suppository Place 1 suppository (10 mg total) rectally daily as needed for moderate constipation. Patient not taking: Reported on 12/24/2017 12/08/17   Leroy Sea, MD  HYDROcodone-acetaminophen (NORCO/VICODIN) 5-325 MG tablet Take 1 tablet by mouth every 4 (four) hours as needed for severe pain. Patient not taking: Reported on 12/24/2017 11/12/17   Barnetta Chapel, MD         CCT 60 min   Brett Canales Minor ACNP Adolph Pollack PCCM Pager [phone] till 1 pm If no answer page [phone] 12/24/2017, 8:10 AM

## 2017-12-24 NOTE — Progress Notes (Signed)
Initial Nutrition Assessment  DOCUMENTATION CODES:   Not applicable  INTERVENTION:   Tube Feeding:  Vital High Protein @ 45 ml/hr Providing 1080 kcals, 95 g of protein, 1109 mL of free water Meets 100% estimated protein needs  TF regimen and propofol at current rate providing 1365 total kcal/day (99 % of kcal needs)  NUTRITION DIAGNOSIS:   Inadequate oral intake related to acute illness as evidenced by NPO status.  GOAL:   Patient will meet greater than or equal to 90% of their needs  MONITOR:   TF tolerance, Vent status, Labs, Weight trends  REASON FOR ASSESSMENT:   Consult, Ventilator Enteral/tube feeding initiation and management  ASSESSMENT:   84 yo female admitted with acute respiratory failure requiring intubation due to multifocal pneumonia, possible acute CHF. PMH includes COPD, CHF EF 20%, dementia with behavioral distrurbances,CKD, PE  Patient is currently intubated on ventilator support MV: 8.4 L/min Temp (24hrs), Avg:98.4 F (36.9 C), Min:97.8 F (36.6 C), Max:98.9 F (37.2 C)  Propofol: 10.8 ml/hr  No family at present; unable to obtain diet and weight history at present. Some weight fluctuations over the past year per weight encounters but with hx of CHF, unsure how much of this is related to fluid status  Labs: potassium 3.2, magnesium 1.5, BUN 70, Creatinine 2.74 Meds: reviewed  NUTRITION - FOCUSED PHYSICAL EXAM:    Most Recent Value  Orbital Region  No depletion  Upper Arm Region  No depletion  Thoracic and Lumbar Region  No depletion  Buccal Region  No depletion  Temple Region  No depletion  Clavicle Bone Region  Mild depletion  Clavicle and Acromion Bone Region  Mild depletion  Scapular Bone Region  Mild depletion  Dorsal Hand  Unable to assess  Patellar Region  Mild depletion  Anterior Thigh Region  Mild depletion  Posterior Calf Region  Mild depletion       Diet Order:   Diet Order            Diet NPO time specified  Diet  effective now              EDUCATION NEEDS:   Not appropriate for education at this time  Skin:  Skin Assessment: Reviewed RN Assessment  Last BM:  no documented BM  Height:   Ht Readings from Last 1 Encounters:  12/24/17 5\' 4"  (1.626 m)    Weight:   Wt Readings from Last 1 Encounters:  12/24/17 72 kg    Ideal Body Weight:     BMI:  Body mass index is 27.25 kg/m.  Estimated Nutritional Needs:   Kcal:  1380 kcals   Protein:  85-125 g  Fluid:  >/= 1.5 L   Romelle Starcher MS, RD, LDN, CNSC [phone] Pager  [phone] Weekend/On-Call Pager

## 2017-12-24 NOTE — ED Provider Notes (Addendum)
[facility] EMERGENCY DEPARTMENT Provider Note   CSN: [id number] Arrival date & time: 12/24/17  0109     History   Chief Complaint Chief Complaint  Patient presents with  . Respiratory Distress    HPI [name] is a 84 y.o. female.  HPI Level 5 caveat for acute respiratory distress.   84 y.o.female with history of chronic systolic heart failure (EF 20%), stage IV CKD, dementia, severe aortic stenosis status post TAVR 2014, PAF on warfarin comes in with chief complaint of shortness of breath.  Patient resides at a nursing home.  She was noted to be in respiratory distress with hypoxia and therefore EMS was called.  Patient reports that she has been having thick sputum with her cough.  She denies any fevers, chest pain.  Patient has been taking her medications as prescribed.   Past Medical History:  Diagnosis Date  . Acute on chronic renal failure [facility])    sees Dr Posey Pronto   . Anemia    Acute blood loss  . Aortic regurgitation   . Aortic stenosis 10/13/2012   Low EF, low gradient with severe aortic stenosis confirmed by dobutamine stress echocardiogram s/p TAVR 12/2012  . Asthma   . Atrial fibrillation (HCC)    tachy-brady syndrome with <1% recurrent PAF since pacemaker placement  . Cataracts, bilateral   . Chronic combined systolic and diastolic CHF (congestive heart failure) ([place])   . Chronic lower back pain   . CKD (chronic kidney disease)   . Coronary artery disease involving native coronary artery of native heart   . Dementia    Without behavioral disturbance  . Dysrhythmia   . Fibromyalgia   . Gastroesophageal reflux disease   . H/O dizziness   . H/O urinary frequency   . H/O: stroke   . Hard of hearing   . Headache    hx of migraines   . Heart murmur   . History of blood transfusion   . History of bronchitis   . History of kidney stones   . History of urinary tract infection   . HLD (hyperlipidemia)   . HTN (hypertension)   .  Hypothyroidism   . Neuropathy   . Nonischemic cardiomyopathy ([place])   . On home oxygen therapy    patient uses at nite- 2L- has not used in > 6 months per patient  . Osteoarthritis   . Osteoporosis   . Pneumonia    hx of x 3   . PONV (postoperative nausea and vomiting)   . Presence of permanent cardiac pacemaker   . Pulmonary embolism (HCC)    HISTORY OF, the pt. had a recurrent bilateral pulmonary emboli in 2005, on warfarin therapy and at which time she under went implantation of IVC filter  . Repeated falls   . Rupture of right patellar tendon   . Sciatica   . Shortness of breath dyspnea    with exertion   . Sleep apnea    uses oxygen at night and PRN- not used since > 6 months / DOES NOT USE  C-PAP  . Spinal stenosis   . Stroke ([place])   . Symptomatic bradycardia   . Symptomatic bradycardia 2012   s/p Medtronic PPM  . Syncope   . Urinary incontinence   . Urinary urgency     Patient Active Problem List   Diagnosis Date Noted  . Pneumonia due to infectious organism 12/18/2017  . Acute on chronic congestive heart failure ([place])   .  Elevated brain natriuretic peptide (BNP) level   . SOB (shortness of breath)   . Acute on chronic combined systolic and diastolic congestive heart failure ([place]) 12/04/2017  . Elevated troponin 11/07/2017  . Acute kidney injury superimposed on chronic kidney disease ([place]) 11/07/2017  . Anticoagulation management encounter 09/29/2016  . Respiratory failure ([place]) 07/21/2016  . Acute on chronic respiratory failure with hypoxia ([place]) 07/21/2016  . Head contusion 07/15/2016  . Fall 07/14/2016  . Chest pain at rest   . Acute on chronic systolic congestive heart failure ([place])   . Unstable angina ([place])   . Acute respiratory failure ([place]) 06/13/2016  . Elevated blood sugar 06/13/2016  . Dementia without behavioral disturbance 01/09/2016  . Chronic constipation 01/09/2016  . Neuropathic pain 01/09/2016  . Esophageal reflux 01/09/2016  .  Hypothyroidism 01/09/2016  . CKD (chronic kidney disease) stage 4, GFR 15-29 ml/min (HCC) 01/09/2016  . Right patellar tendon rupture 06/25/2015  . Avulsion of right patellar tendon 03/08/2015  . Aspiration pneumonia ([place]) 12/14/2014  . Anemia of chronic disease 12/14/2014  . Aspiration into airway 12/14/2014  . Swallowing dysfunction 12/14/2014  . S/P right TKA 12/04/2014  . S/P knee replacement 12/04/2014  . Acute blood loss anemia 12/28/2012  . Thrombocytopenia ([place]) 12/28/2012  . Toe fracture 12/28/2012  . CKD (chronic kidney disease), stage III ([place]) 12/28/2012  . Low back pain 12/28/2012  . Wound dehiscence, surgical 12/28/2012  . Atelectasis 12/28/2012  . S/P TAVR (transcatheter aortic valve replacement) 12/21/2012  . Severe aortic stenosis 12/06/2012  . Aortic stenosis 10/13/2012  . Acute on chronic combined systolic and diastolic CHF (congestive heart failure) ([place]) 10/13/2012  . Pacemaker 10/15/2010  . Paroxysmal atrial fibrillation ([place]) 10/15/2010  . Dyslipidemia 10/15/2010  . Hypertension 10/15/2010  . Chronic diastolic heart failure ([place]) 10/15/2010    Past Surgical History:  Procedure Laterality Date  . ABDOMINAL HYSTERECTOMY    . APPENDECTOMY    . BACK SURGERY    . BUNIONECTOMY    . CARDIAC CATHETERIZATION    . CATARACT EXTRACTION    . CENTRAL VENOUS CATHETER INSERTION Left 12/21/2012   Procedure: INSERTION CENTRAL LINE ADULT;  Surgeon: Sherren Mocha, MD;  Location: [facility];  Service: Open Heart Surgery;  Laterality: Left;  . ELBOW SURGERY     bilat   . INTRAOPERATIVE TRANSESOPHAGEAL ECHOCARDIOGRAM N/A 12/21/2012   Procedure: INTRAOPERATIVE TRANSESOPHAGEAL ECHOCARDIOGRAM;  Surgeon: Sherren Mocha, MD;  Location: [facility] OR;  Service: Open Heart Surgery;  Laterality: N/A;  . KNEE SURGERY Left   . LEFT AND RIGHT HEART CATHETERIZATION WITH CORONARY ANGIOGRAM N/A 10/18/2012   Procedure: LEFT AND RIGHT HEART CATHETERIZATION WITH CORONARY ANGIOGRAM;  Surgeon: Sherren Mocha, MD;  Location[facility] CATH LAB;  Service: Cardiovascular;  Laterality: N/A;  . NASAL SEPTUM SURGERY    . NOSE SURGERY     X 2  . ORIF PATELLA Right 03/08/2015   Procedure:  OPEN REDUCTION INTERNAL FIXATION RIGHT  PATELLA TENDON AVULSION;  Surgeon: Paralee Cancel, MD;  Location: [facility] ORS;  Service: Orthopedics;  Laterality: Right;  . PACEMAKER INSERTION     Medtronic  . PATELLAR TENDON REPAIR Right 06/25/2015   Procedure: RIGHT PATELLA TENDON REVISION/REPAIR;  Surgeon: Paralee Cancel, MD;  Location: [facility] ORS;  Service: Orthopedics;  Laterality: Right;  . SHOULDER SURGERY     bilat   . THYROIDECTOMY, PARTIAL    . TONSILLECTOMY    . TOTAL KNEE ARTHROPLASTY Right 12/04/2014   Procedure: RIGHT TOTAL  KNEE ARTHROPLASTY;  Surgeon: Rodman Key  Alvan Dame, MD;  Location: [facility] ORS;  Service: Orthopedics;  Laterality: Right;  . TRANSCATHETER AORTIC VALVE REPLACEMENT, TRANSFEMORAL  12/21/2012   a. 35m Edwards Sapien XT transcatheter heart valve placed via open left transfemoral approach b. Intra-op TEE: well-seated bioprosthetic aortic valve with mean gradient 2 mmHg, trivial AI, mild MR, EF 30-35%  . TRANSCATHETER AORTIC VALVE REPLACEMENT, TRANSFEMORAL N/A 12/21/2012   Procedure: TRANSCATHETER AORTIC VALVE REPLACEMENT, TRANSFEMORAL;  Surgeon: MSherren Mocha MD;  [place]  Service: Open Heart Surgery;  Laterality: N/A;     OB History   None      Home Medications    Prior to Admission medications   Medication Sig Start Date End Date Taking? Authorizing Provider  allopurinol (ZYLOPRIM) 100 MG tablet Take 100 mg by mouth daily.   Yes [provider]  aluminum-magnesium hydroxide-simethicone (MAALOX) 200-200-20 MG/5ML SUSP Take 30 mLs by mouth 3 (three) times daily as needed (for indegestion).   Yes [provider]  amiodarone (PACERONE) 100 MG tablet Take 100 mg by mouth daily.   Yes [provider]  atorvastatin (LIPITOR) 10 MG tablet Take 10 mg by mouth daily at 6 PM.    Yes  [provider]  calcitRIOL (ROCALTROL) 0.25 MCG capsule Take 0.25 mcg by mouth daily.    Yes [provider]  cholecalciferol (VITAMIN D) 1000 units tablet Take 2,000 Units by mouth at bedtime.   Yes [provider]  cycloSPORINE (RESTASIS) 0.05 % ophthalmic emulsion Place 1 drop into both eyes 2 (two) times daily.    Yes [provider]  docusate sodium (COLACE) 100 MG capsule Take 1 capsule (100 mg total) by mouth 2 (two) times daily. 12/08/17  Yes SThurnell Lose MD  donepezil (ARICEPT) 10 MG tablet Take 10 mg by mouth at bedtime.    Yes [provider]  DULoxetine (CYMBALTA) 60 MG capsule Take 60 mg by mouth daily at 6 PM.    Yes [provider]  ferrous sulfate 325 (65 FE) MG tablet Take 325 mg by mouth daily.    Yes [provider]  gabapentin (NEURONTIN) 100 MG capsule Take 200 mg by mouth 2 (two) times daily.   Yes [provider]  ipratropium-albuterol (DUONEB) 0.5-2.5 (3) MG/3ML SOLN Take 3 mLs by nebulization every 6 (six) hours as needed (wheezing/shortness of breath).   Yes [provider]  ipratropium-albuterol (DUONEB) 0.5-2.5 (3) MG/3ML SOLN Take 3 mLs by nebulization 2 (two) times daily.   Yes [provider]  isosorbide mononitrate (IMDUR) 30 MG 24 hr tablet Take 1 tablet (30 mg total) by mouth daily. Patient taking differently: Take 60 mg by mouth daily.  08/05/16 12/24/25 Yes Bhagat, Bhavinkumar, PA  levothyroxine (SYNTHROID, LEVOTHROID) 100 MCG tablet Take 1 tablet (100 mcg total) by mouth daily before breakfast. 11/12/17  Yes ODana AllanI, MD  lidocaine (LIDODERM) 5 % Place 1 patch onto the skin daily. Apply to lower back as needed for pain.   Yes [provider]  linaclotide (LINZESS) 290 MCG CAPS capsule Take 290 mcg by mouth daily.    Yes [provider]  Menthol, Topical Analgesic, (BIOFREEZE) 4 % GEL Apply 1 application topically 2 (two) times daily.   Yes  [provider]  metolazone (ZAROXOLYN) 2.5 MG tablet 1 pill every Wednesday as needed once a week for weight gain over 2.5 pounds, edema or shortness of breath. Patient taking differently: Take 2.5 mg by mouth See admin instructions. 1 pill every Wednesday as  needed once a week for weight gain over 2.5 pounds, edema or shortness of breath. 12/08/17  Yes Thurnell Lose, MD  metoprolol tartrate (LOPRESSOR) 25 MG tablet Take 0.5 tablets (12.5 mg total) by mouth 2 (two) times daily. 12/08/17  Yes Thurnell Lose, MD  morphine 20 MG/5ML solution Take 10 mg by mouth every 2 (two) hours as needed for pain.   Yes [provider]  Multiple Vitamin (MULTIVITAMIN WITH MINERALS) TABS tablet Take 1 tablet by mouth daily.   Yes [provider]  mupirocin ointment (BACTROBAN) 2 % Place 1 application into the nose 2 (two) times daily. 11/12/17  Yes Bonnell Public, MD  nitroGLYCERIN (NITROSTAT) 0.4 MG SL tablet Place 0.4 mg under the tongue every 5 (five) minutes as needed for chest pain (MAX 3 TABLETS).    Yes [provider]  ondansetron (ZOFRAN) 4 MG tablet Take 4 mg by mouth every 8 (eight) hours as needed for nausea or vomiting.   Yes [provider]  polyethylene glycol (MIRALAX / GLYCOLAX) packet Take 17 g by mouth daily.   Yes [provider]  torsemide (DEMADEX) 20 MG tablet Take 40 mg by mouth daily.    Yes [provider]  warfarin (COUMADIN) 3 MG tablet Take 3 mg by mouth daily.   Yes [provider]  bisacodyl (DULCOLAX) 10 MG suppository Place 1 suppository (10 mg total) rectally daily as needed for moderate constipation. Patient not taking: Reported on 12/24/2017 12/08/17   Thurnell Lose, MD  HYDROcodone-acetaminophen (NORCO/VICODIN) 5-325 MG tablet Take 1 tablet by mouth every 4 (four) hours as needed for severe pain. Patient not taking: Reported on 12/24/2017 11/12/17   Bonnell Public, MD    Family History Family  History  Problem Relation Age of Onset  . Ovarian cancer Mother        Deceased  . Epilepsy Father        Deceased    Social History Social History   Tobacco Use  . Smoking status: Never Smoker  . Smokeless tobacco: Never Used  Substance Use Topics  . Alcohol use: No  . Drug use: No     Allergies   Nubain [nalbuphine hcl]; Codeine; and Darvocet [propoxyphene n-acetaminophen]   Review of Systems Review of Systems  Unable to perform ROS: Severe respiratory distress     Physical Exam Updated Vital Signs BP (!) 151/67   Pulse (!) 102   Temp 98.9 F (37.2 C) (Rectal)   Resp 16   Ht 5' (1.524 m)   Wt 72 kg   SpO2 100%   BMI 31.00 kg/m   Physical Exam  Constitutional: She appears well-developed.  HENT:  Head: Atraumatic.  Eyes: EOM are normal.  Neck: Neck supple.  Cardiovascular: Normal rate.  Pulmonary/Chest: Effort normal. She has no wheezes.  Diffuse rhonchorous and coarse breath sounds  Abdominal: Bowel sounds are normal.  Musculoskeletal: She exhibits no edema or tenderness.  Neurological: She is alert.  Skin: Skin is warm and dry.  Nursing note and vitals reviewed.    ED Treatments / Results  Labs (all labs ordered are listed, but only abnormal results are displayed) Labs Reviewed  BASIC METABOLIC PANEL - Abnormal; Notable for the following components:      Result Value   Glucose, Bld 257 (*)    BUN 69 (*)    Creatinine, Ser 2.81 (*)    GFR calc non Af Amer 15 (*)    GFR calc Af  Amer 17 (*)    Anion gap 20 (*)    All other components within normal limits  CBC - Abnormal; Notable for the following components:   WBC 12.5 (*)    RBC 3.82 (*)    Hemoglobin 11.9 (*)    MCV 102.1 (*)    Platelets 483 (*)    All other components within normal limits  I-STAT ARTERIAL BLOOD GAS, ED - Abnormal; Notable for the following components:   pH, Arterial 7.309 (*)    pCO2 arterial 51.5 (*)    pO2, Arterial 81.0 (*)    All other components within normal  limits  I-STAT CG4 LACTIC ACID, ED - Abnormal; Notable for the following components:   Lactic Acid, Venous 6.90 (*)    All other components within normal limits  CULTURE, BLOOD (ROUTINE X 2)  CULTURE, BLOOD (ROUTINE X 2)  URINALYSIS, ROUTINE W REFLEX MICROSCOPIC  TRIGLYCERIDES  I-STAT CG4 LACTIC ACID, ED    EKG EKG Interpretation  Date/Time:  Thursday December 24 2017 05:19:51 EDT Ventricular Rate:  100 PR Interval:    QRS Duration: 162 QT Interval:  409 QTC Calculation: 528 R Axis:   -64 Text Interpretation:  Sinus tachycardia Probable left atrial enlargement Left bundle branch block No acute changes No significant change since last tracing Confirmed by Varney Biles ([id number]) on 12/24/2017 5:43:21 AM   Radiology Dg Chest Port 1 View  Result Date: 12/24/2017 CLINICAL DATA:  Endotracheal tube placement. OG present. History of CHF, coronary artery disease, hypertension, pacemaker. EXAM: PORTABLE CHEST 1 VIEW COMPARISON:  Chest x-ray from earlier same day. Chest x-ray dated 12/18/2017. FINDINGS: Endotracheal tube appears well positioned with tip approximately 3 cm above the carina. Enteric tube passes below the diaphragm. Pacemaker leads appear grossly stable in position. Heart size and mediastinal contours are stable. Atherosclerotic changes noted at the aortic arch. Aortic valve hardware appears stable in position. Worsening pulmonary edema pattern bilaterally. Probable small bilateral pleural effusions. Probable atelectasis at the LEFT lung base. No pneumothorax seen. IMPRESSION: 1. Endotracheal tube is well positioned with tip approximately 3 cm above the carina. 2. OG tube passes below the diaphragm. 3. Worsening pulmonary edema. 4. Probable small bilateral pleural effusions and LEFT basilar atelectasis. 5. Stable mild cardiomegaly. Electronically Signed   By: Franki Cabot M.D.   On: 12/24/2017 07:55   Dg Chest Portable 1 View  Result Date: 12/24/2017 CLINICAL DATA:  Shortness of  breath and chest pain today. EXAM: PORTABLE CHEST 1 VIEW COMPARISON:  12/18/2017 FINDINGS: Cardiac pacemaker. Coronary valve prosthesis. Mild cardiac enlargement. Bilateral perihilar infiltration and airspace disease, more prominent on the right. This may represent edema or multifocal pneumonia. Appearances are more prominent than on the previous study. Probable small left pleural effusion. No pneumothorax. Calcification of the aorta. IMPRESSION: Cardiac enlargement with bilateral perihilar infiltration and small left pleural effusion. Progression since previous study. Electronically Signed   By: Lucienne Capers M.D.   On: 12/24/2017 06:05    Procedures Procedure Name: Intubation Date/Time: 12/24/2017 7:53 AM Performed by: Varney Biles, MD Pre-anesthesia Checklist: Patient identified, Patient being monitored, Emergency Drugs available, Timeout performed and Suction available Oxygen Delivery Method: Non-rebreather mask Preoxygenation: Pre-oxygenation with 100% oxygen Induction Type: Rapid sequence Ventilation: Mask ventilation without difficulty Laryngoscope Size: Mac and 3 Grade View: Grade I Tube size: 7.5 mm Number of attempts: 1 Placement Confirmation: ETT inserted through vocal cords under direct vision,  CO2 detector and Breath sounds checked- equal and bilateral Secured at: 21 cm  CRITICAL CARE Performed by: Vicci Reder   Total critical care time: 56 minutes  Critical care time was exclusive of separately billable procedures and treating other patients.  Critical care was necessary to treat or prevent imminent or life-threatening deterioration.  Critical care was time spent personally by me on the following activities: development of treatment plan with patient and/or surrogate as well as nursing, discussions with consultants, evaluation of patient's response to treatment, examination of patient, obtaining history from patient or surrogate, ordering and performing  treatments and interventions, ordering and review of laboratory studies, ordering and review of radiographic studies, pulse oximetry and re-evaluation of patient's condition.  (including critical care time)    Medications Ordered in ED Medications  vancomycin (VANCOCIN) IVPB 1000 mg/200 mL premix (1,000 mg Intravenous New Bag/Given 12/24/17 0744)  azithromycin (ZITHROMAX) 500 mg in sodium chloride 0.9 % 250 mL IVPB (500 mg Intravenous New Bag/Given 12/24/17 0635)  ceFEPIme (MAXIPIME) 1 g in sodium chloride 0.9 % 100 mL IVPB (has no administration in time range)  vancomycin (VANCOCIN) IVPB 1000 mg/200 mL premix (has no administration in time range)  propofol (DIPRIVAN) 1000 MG/100ML infusion (has no administration in time range)  fentaNYL (SUBLIMAZE) injection 50 mcg (has no administration in time range)  fentaNYL (SUBLIMAZE) injection 50 mcg (has no administration in time range)  propofol (DIPRIVAN) 1000 MG/100ML infusion (has no administration in time range)  midazolam (VERSED) injection 1 mg (has no administration in time range)  midazolam (VERSED) injection 1 mg (has no administration in time range)  sodium chloride 0.9 % bolus 1,000 mL (has no administration in time range)  ceFEPIme (MAXIPIME) 2 g in sodium chloride 0.9 % 100 mL IVPB (0 g Intravenous Stopped 12/24/17 0704)  etomidate (AMIDATE) injection (20 mg Intravenous Given 12/24/17 0713)  rocuronium (ZEMURON) injection (75 mg Intravenous Given 12/24/17 0714)     Initial Impression / Assessment and Plan / ED Course  I have reviewed the triage vital signs and the nursing notes.  Pertinent labs & imaging results that were available during my care of the patient were reviewed by me and considered in my medical decision making (see chart for details).  Clinical Course as of Dec 24 756  Thu Dec 24, 2017  0719 While on BiPAP, patient suddenly became nauseous and started vomiting.  She was removed off of the BiPAP and her skin saturation  dropped into the 70s.  Put patient on nonrebreather, and although her oxygen saturation improved she continued to have severe cough along with nausea.  Decision was made to intubate at that time. Patient will be going for CT scan.  Based on information available to me, patient has questionable sepsis versus pulmonary edema secondary to CHF.  We will hold off fluids for now. Patient's lactic acidosis is likely a result of acute respiratory distress.   [AN]  [id number] Prior to intubation we discussed CODE STATUS with the patient.  She was agreeable to intubation.    HER MOST form also states that she is Full code.   [AN]    Clinical Course User Index [AN] Varney Biles, MD    84 year old female comes in with chief complaint of shortness of breath.  She is noted to be in acute respiratory failure with hypoxia.  Patient had a recent admission for CHF exacerbation.  X-ray of her chest is equivocal -therefore clinical concerns remain for CHF exacerbation with bilateral interstitial edema versus multifocal pneumonia.   Initial lactate is 6.  Patient's white count is  normal and rectal temperature is also normal.  With information available to me at this time I suspect that the lactic acidosis is because of work of breathing and not secondary to septic shock.  Given that the x-ray is equivocal we will carry on with CT chest without contrast for further delineation of the underlying process.  Labs otherwise show worsening creatinine, she again could be because of CHF.  CODE STATUS is full code. Broad-spectrum antibiotics to be initiated right now given that this could be evolving sepsis.    Final Clinical Impressions(s) / ED Diagnoses   Final diagnoses:  Acute respiratory failure with hypoxia and hypercapnia (HCC)  Lactic acidosis    ED Discharge Orders    None       Varney Biles, MD 12/24/17 4782    Varney Biles, MD 12/24/17 [phone]

## 2017-12-24 NOTE — ED Triage Notes (Signed)
Per EMS, pt coming from [facility] with complaints of respiratory distress. Pt was found guppy breathing by staff and sating at 70% on 5l [place]. Pt was put on CPAP and sating at 83%. Pt has hx of CHF and COPD.

## 2017-12-24 NOTE — ED Notes (Signed)
Brett Canales NP with [facility] at bedside.

## 2017-12-24 NOTE — ED Notes (Signed)
Notified Dr. Rhunette Croft of elevated Lactic Acid

## 2017-12-25 ENCOUNTER — Inpatient Hospital Stay: Payer: Self-pay

## 2017-12-25 ENCOUNTER — Inpatient Hospital Stay (HOSPITAL_COMMUNITY): Payer: Medicare Other

## 2017-12-25 DIAGNOSIS — I959 Hypotension, unspecified: Secondary | ICD-10-CM

## 2017-12-25 DIAGNOSIS — N179 Acute kidney failure, unspecified: Secondary | ICD-10-CM

## 2017-12-25 DIAGNOSIS — N189 Chronic kidney disease, unspecified: Secondary | ICD-10-CM

## 2017-12-25 DIAGNOSIS — J9602 Acute respiratory failure with hypercapnia: Secondary | ICD-10-CM

## 2017-12-25 DIAGNOSIS — G9340 Encephalopathy, unspecified: Secondary | ICD-10-CM

## 2017-12-25 LAB — BASIC METABOLIC PANEL
ANION GAP: 12 (ref 5–15)
BUN: 67 mg/dL — AB (ref 8–23)
CO2: 23 mmol/L (ref 22–32)
Calcium: 8.8 mg/dL — ABNORMAL LOW (ref 8.9–10.3)
Chloride: 107 mmol/L (ref 98–111)
Creatinine, Ser: 3.45 mg/dL — ABNORMAL HIGH (ref 0.44–1.00)
GFR calc Af Amer: 13 mL/min — ABNORMAL LOW (ref >60)
GFR calc non Af Amer: 11 mL/min — ABNORMAL LOW (ref >60)
GLUCOSE: 109 mg/dL — AB (ref 70–99)
POTASSIUM: 3.2 mmol/L — AB (ref 3.5–5.1)
SODIUM: 142 mmol/L (ref 135–145)

## 2017-12-25 LAB — CBC
HCT: 30.2 % — ABNORMAL LOW (ref 36.0–46.0)
HEMOGLOBIN: 9.7 g/dL — AB (ref 12.0–15.0)
MCH: 31.2 pg (ref 26.0–34.0)
MCHC: 32.1 g/dL (ref 30.0–36.0)
MCV: 97.1 fL (ref 78.0–100.0)
Platelets: 358 10*3/uL (ref 150–400)
RBC: 3.11 MIL/uL — AB (ref 3.87–5.11)
RDW: 15.5 % (ref 11.5–15.5)
WBC: 13.1 10*3/uL — ABNORMAL HIGH (ref 4.0–10.5)

## 2017-12-25 LAB — POCT I-STAT 3, ART BLOOD GAS (G3+)
Acid-Base Excess: 3 mmol/L — ABNORMAL HIGH (ref 0.0–2.0)
BICARBONATE: 25.9 mmol/L (ref 20.0–28.0)
O2 Saturation: 98 %
PO2 ART: 88 mmHg (ref 83.0–108.0)
Patient temperature: 99.2
TCO2: 27 mmol/L (ref 22–32)
pCO2 arterial: 30.7 mmHg — ABNORMAL LOW (ref 32.0–48.0)
pH, Arterial: 7.536 — ABNORMAL HIGH (ref 7.350–7.450)

## 2017-12-25 LAB — PHOSPHORUS
PHOSPHORUS: 3.7 mg/dL (ref 2.5–4.6)
Phosphorus: 3.7 mg/dL (ref 2.5–4.6)

## 2017-12-25 LAB — MAGNESIUM: Magnesium: 1.5 mg/dL — ABNORMAL LOW (ref 1.7–2.4)

## 2017-12-25 LAB — GLUCOSE, CAPILLARY
GLUCOSE-CAPILLARY: 95 mg/dL (ref 70–99)
GLUCOSE-CAPILLARY: 116 mg/dL — AB (ref 70–99)
GLUCOSE-CAPILLARY: 96 mg/dL (ref 70–99)
Glucose-Capillary: 101 mg/dL — ABNORMAL HIGH (ref 70–99)
Glucose-Capillary: 104 mg/dL — ABNORMAL HIGH (ref 70–99)

## 2017-12-25 LAB — HEPARIN LEVEL (UNFRACTIONATED): Heparin Unfractionated: 0.76 IU/mL — ABNORMAL HIGH (ref 0.30–0.70)

## 2017-12-25 MED ORDER — HEPARIN (PORCINE) IN NACL 100-0.45 UNIT/ML-% IJ SOLN
950.0000 [IU]/h | INTRAMUSCULAR | Status: DC
  Administered 2017-12-25: 900 [IU]/h via INTRAVENOUS
  Administered 2017-12-26: 800 [IU]/h via INTRAVENOUS
  Administered 2017-12-27 – 2018-01-01 (×5): 950 [IU]/h via INTRAVENOUS
  Filled 2017-12-25 (×8): qty 250

## 2017-12-25 MED ORDER — FUROSEMIDE 10 MG/ML IJ SOLN
120.0000 mg | Freq: Once | INTRAVENOUS | Status: AC | PRN
  Administered 2017-12-26: 120 mg via INTRAVENOUS
  Filled 2017-12-25: qty 12
  Filled 2017-12-25: qty 10

## 2017-12-25 MED ORDER — VITAL HIGH PROTEIN PO LIQD
1000.0000 mL | ORAL | Status: DC
  Administered 2017-12-25: 1000 mL
  Filled 2017-12-25: qty 1000

## 2017-12-25 MED ORDER — LEVOTHYROXINE SODIUM 100 MCG PO TABS
100.0000 ug | ORAL_TABLET | Freq: Every day | ORAL | Status: DC
  Administered 2017-12-26 – 2018-01-02 (×8): 100 ug
  Filled 2017-12-25 (×8): qty 1

## 2017-12-25 MED ORDER — LEVOTHYROXINE SODIUM 100 MCG PO TABS: 100.0000 ug | ORAL_TABLET | Freq: Every day | ORAL | Status: DC

## 2017-12-25 MED ORDER — HEPARIN BOLUS VIA INFUSION
3000.0000 [IU] | Freq: Once | INTRAVENOUS | Status: AC
  Administered 2017-12-25: 3000 [IU] via INTRAVENOUS
  Filled 2017-12-25: qty 3000

## 2017-12-25 MED ORDER — FUROSEMIDE 10 MG/ML IJ SOLN
8.0000 mg/h | INTRAVENOUS | Status: DC
  Filled 2017-12-25: qty 25

## 2017-12-25 MED ORDER — MAGNESIUM SULFATE 2 GM/50ML IV SOLN
2.0000 g | Freq: Once | INTRAVENOUS | Status: AC
  Administered 2017-12-25: 2 g via INTRAVENOUS
  Filled 2017-12-25: qty 50

## 2017-12-25 MED ORDER — POTASSIUM CHLORIDE 20 MEQ/15ML (10%) PO SOLN
40.0000 meq | Freq: Once | ORAL | Status: AC
  Administered 2017-12-25: 40 meq
  Filled 2017-12-25: qty 30

## 2017-12-25 NOTE — Progress Notes (Signed)
ANTICOAGULATION CONSULT NOTE - Initial Consult  Pharmacy Consult for heparin Indication: atrial fibrillation, TAVR  Allergies  Allergen Reactions  . Nubain [Nalbuphine Hcl] Hives    Went into cardiac arrest   . Codeine Hives and Nausea Only    Has tolerated Oxycodone  . Darvocet [Propoxyphene N-Acetaminophen] Nausea Only    Patient Measurements: Height: _0  (162.6 cm) Weight: 160 lb 15 oz (73 kg) IBW/kg (Calculated) : 54.7 Heparin Dosing Weight: 61.4  Vital Signs: Temp: 98.6 F (37 C) (09/06 0800) Temp Source: Oral (09/06 0800) BP: 113/50 (09/06 0900) Pulse Rate: 83 (09/06 1120)  Labs: Recent Labs    12/24/17 0552 12/24/17 1023 12/24/17 1358 12/24/17 2055 12/25/17 0853  HGB 11.9* 10.7*  --   --  9.7*  HCT 39.0 34.3*  --   --  30.2*  PLT 483* 335  --   --  358  APTT  --  28  --   --   --   LABPROT  --  14.7  --   --   --   INR  --  1.16  --   --   --   CREATININE 2.81* 2.74*  --   --  3.45*  TROPONINI  --  0.17* 0.20* 0.18*  --     Estimated Creatinine Clearance: 12.1 mL/min (A) (by C-G formula based on SCr of 3.45 mg/dL (H)).   Medical History: Past Medical History:  Diagnosis Date  . Acute on chronic renal failure [facility])    sees Dr Posey Pronto   . Anemia    Acute blood loss  . Aortic regurgitation   . Aortic stenosis 10/13/2012   Low EF, low gradient with severe aortic stenosis confirmed by dobutamine stress echocardiogram s/p TAVR 12/2012  . Asthma   . Atrial fibrillation (HCC)    tachy-brady syndrome with <1% recurrent PAF since pacemaker placement  . Cataracts, bilateral   . Chronic combined systolic and diastolic CHF (congestive heart failure) ([place])   . Chronic lower back pain   . CKD (chronic kidney disease)   . Coronary artery disease involving native coronary artery of native heart   . Dementia    Without behavioral disturbance  . Dysrhythmia   . Fibromyalgia   . Gastroesophageal reflux disease   . H/O dizziness   . H/O urinary frequency   .  H/O: stroke   . Hard of hearing   . Headache    hx of migraines   . Heart murmur   . History of blood transfusion   . History of bronchitis   . History of kidney stones   . History of urinary tract infection   . HLD (hyperlipidemia)   . HTN (hypertension)   . Hypothyroidism   . Neuropathy   . Nonischemic cardiomyopathy ([place])   . On home oxygen therapy    patient uses at nite- 2L- has not used in > 6 months per patient  . Osteoarthritis   . Osteoporosis   . Pneumonia    hx of x 3   . PONV (postoperative nausea and vomiting)   . Presence of permanent cardiac pacemaker   . Pulmonary embolism (HCC)    HISTORY OF, the pt. had a recurrent bilateral pulmonary emboli in 2005, on warfarin therapy and at which time she under went implantation of IVC filter  . Repeated falls   . Rupture of right patellar tendon   . Sciatica   . Shortness of breath dyspnea    with exertion   .  Sleep apnea    uses oxygen at night and PRN- not used since > 6 months / DOES NOT USE  C-PAP  . Spinal stenosis   . Stroke ([place])   . Symptomatic bradycardia   . Symptomatic bradycardia 2012   s/p Medtronic PPM  . Syncope   . Urinary incontinence   . Urinary urgency     Medications:  Scheduled:  . chlorhexidine gluconate (MEDLINE KIT)  15 mL Mouth Rinse BID  . insulin aspart  0-9 Units Subcutaneous Q4H  . ipratropium-albuterol  3 mL Nebulization Q6H  . [START ON 12/26/2017] levothyroxine  100 mcg Oral QAC breakfast  . mouth rinse  15 mL Mouth Rinse 10 times per day  . potassium chloride  40 mEq Per Tube Once   Infusions:  . sodium chloride    . azithromycin 10 mL/hr at 12/24/17 1000  . ceFEPime (MAXIPIME) IV 1 g (12/25/17 0615)  . famotidine (PEPCID) IV 20 mg (12/25/17 1057)  . feeding supplement (VITAL HIGH PROTEIN)    . magnesium sulfate 1 - 4 g bolus IVPB    . propofol (DIPRIVAN) infusion 15.046 mcg/kg/min (12/25/17 0752)  . [START ON 12/26/2017] vancomycin     PRN: sodium chloride, fentaNYL  (SUBLIMAZE) injection, fentaNYL (SUBLIMAZE) injection, midazolam, midazolam  Assessment: 84 yo F w/ PAF (on warfarin PTA) and TAVR (2014) presenting with respiratory distress and CHF exacerbation. Patient was taking 3 mg daily PTA. Pharmacy has been consulted to start a heparin drip with subtherapeutic INR of 1.1 on admission.  Hgb has been low, but relatively stable. Platelet count wnl.  Goal of Therapy:  Heparin level 0.3-0.7 units/ml Monitor platelets by anticoagulation protocol: Yes   Plan:  Give 3,000 units bolus x 1 Start heparin infusion at 900 units/hr Check anti-Xa level in 8 hours and daily while on heparin Continue to monitor H&H and platelets  Follow up resuming warfarin  Vertis Kelch, PharmD PGY1 Pharmacy Resident Phone [phone] 12/25/2017       11:59 AM

## 2017-12-25 NOTE — Progress Notes (Signed)
[name]  NWG:[id number] DOB: 12-28-1932 DOA: 12/24/2017 PCP: Pearson Grippe, MD    LOS: 1 day   Reason for Consult / Chief Complaint:  Acute on chronic respiratory failure  Consulting MD and date:    HPI/Summary of hospital stay:  84 year old female with extensive past medical history that is includes but not limited to O2 dependent COPD, morbid obesity, congestive heart failure with a EF of 20%, aortic stenosis severe, paroxysmal atrial fibrillation on Coumadin, dementia, chronic kidney disease with a base creatinine of 2.2, history of pulmonary embolism, who was recently discharged from [facility] on 12/07/2017 for similar episode of acute shortness of breath responding to Lasix. She returns to [facility] emergency department on 12/24/2017 again with acute shortness of breath along with thick sputum she was placed on BiPAP did not tolerate it developed frothy blood-tinged sputum and when asked if she wanted to be intubated was intubated by the emergency department physician. Pulmonary critical care was called to admit.  Subjective:  No events overnight, no new complaints.  Objective   Blood pressure (!) 113/50, pulse 83, temperature 98.6 F (37 C), temperature source Oral, resp. rate 18, height 5\' 4"  (1.626 m), weight 73 kg, SpO2 98 %.    Vent Mode: CPAP;PSV FiO2 (%):  [40 %-60 %] 40 % Set Rate:  [20 bmp] 20 bmp Vt Set:  [440 mL] 440 mL PEEP:  [5 cmH20] 5 cmH20 Pressure Support:  [10 cmH20] 10 cmH20 Plateau Pressure:  [11 cmH20-17 cmH20] 11 cmH20   Intake/Output Summary (Last 24 hours) at 12/25/2017 1128 Last data filed at 12/25/2017 0600 Gross per 24 hour  Intake 3164.67 ml  Output -  Net 3164.67 ml   Filed Weights   12/24/17 0530 12/25/17 0500  Weight: 72 kg 73 kg    Examination: General: Chronically ill appearing female, NAD HENT: [place]/AT, PERRL, EOM-I and MMM Lungs: Coarse BS diffusely Cardiovascular: RRR, Nl S1/S2 and -M/R/G Abdomen: Obese, soft, NT, ND  and +BS Extremities: 1+ edema Neuro: Alert and interactive, moving all ext to command GU: Foley catheter  Consults: date of consult/date signed off & final recs:  Nephrology 9/6>>>  Procedures: 12/24/2017 intubation per emergency department physician>>  Significant Diagnostic Tests: 12/24/2017 CT of the chest pending>>  Micro Data: 12/24/2017 sputum>> 12/24/2017 blood>> 12/24/2017 urine>>  Antimicrobials:  12/24/2017 vancomycin>> 12/24/2017 Zithromax>> 12/24/2017 cefepime>>  Resolved Hospital Problem list    Assessment & Plan:  Vent dependent respiratory failure secondary to acute pulmonary edema suspected overload in setting congestive heart failure with a EF of 20%.  Chronic obstructive pulmonary disease O2 dependent at 2 L daily.  Noted to have hemoptysis prior to intubation Suspected pneumonia bilateral - Vent bundle - Bronchodilators - D/C CT of chest, start heparin drip - Antimicrobial therapy - Panculture  Congestive heart failure with EF of 20% followed by lumbar cardiology History of paroxysmal atrial fibrillation and pulmonary embolism on chronic Coumadin therapy Dual-chamber pacemaker in place Severe aortic stenosis Status post TAVR - Check INR prior to initiating anticoagulation - Will need diureses but renal functiojn  - Cardiology consult - Controlled ventricular rate as needed  Chronic kidney disease with baseline creatinine of 2.2 followed by Dr. Allena Katz - Creatinine - Nephrology consult called - Hold diureses given renal function for now - Replace electrolytes as indicated  Diabetes mellitus - Sliding scale insulin - CBGs  Anion gap acidosis ,elevated lactic acid which may be a component of sepsis versus low blood flow state. -  Trend lactic acid - Treat with antibiotic - Fluids as needed  Hypothyroidism - TSH - Thyroxin to change to 100 mcg PO daily  Known dementia without behavioral disturbance - D/C propofol - PRN fentanyl as needed  Nausea and  vomiting - Gastric tube to low intermittent suction - Antiemetic as needed - Nutrition for TF  GOC: discussion with patient and son, after a long discussion, decision made to proceed with LCB, no CPR, cardioversion, trach/peg.  Once extubatable will need to discuss reintubation.  Will change code status.  Disposition / Summary of Today's Plan 12/25/17   Intubated emergency department be transferred to intensive care unit Lactic acid noted to be elevated at 6 Chest x-ray with bilateral airspace disease consistent with pneumonia and edema Return to full CODE STATUS until family meeting can be involved in change in her status back to note DNR. Once INR is complete evaluate for initiation of anticoagulation TSH has been ordered Synthroid started at 50 mics one half of  Best Practice / Goals of Care / Disposition.   DVT prophylaxis: Compression hose anticoagulation held further on Coumadin no accurate INR is available at time of admission GI prophylaxis: H2 blocker Diet: N.p.o. due to nausea and vomiting Mobility: Bedrest Code Status: Currently full code despite having a DNR sheet at bedside Family Communication: 2019 no family at bedside  Labs   CBC: Recent Labs  Lab 12/24/17 0552 12/24/17 1023 12/25/17 0853  WBC 12.5* 18.6* 13.1*  HGB 11.9* 10.7* 9.7*  HCT 39.0 34.3* 30.2*  MCV 102.1* 100.6* 97.1  PLT 483* 335 358   Basic Metabolic Panel: Recent Labs  Lab 12/24/17 0552 12/24/17 1023 12/25/17 0853  NA 141 142 142  K 3.6 3.2* 3.2*  CL 98 99 107  CO2 23 27 23   GLUCOSE 257* 156* 109*  BUN 69* 70* 67*  CREATININE 2.81* 2.74* 3.45*  CALCIUM 9.5 9.5 8.8*  MG  --  1.5* 1.5*  PHOS  --  4.6 3.7   GFR: Estimated Creatinine Clearance: 12.1 mL/min (A) (by C-G formula based on SCr of 3.45 mg/dL (H)). Recent Labs  Lab 12/24/17 0552 12/24/17 0617 12/24/17 1023 12/24/17 1032 12/25/17 0853  PROCALCITON  --   --  1.38  --   --   WBC 12.5*  --  18.6*  --  13.1*  LATICACIDVEN   --  6.90*  --  2.8*  --    Liver Function Tests: Recent Labs  Lab 12/24/17 1023  AST 33  ALT 16  ALKPHOS 66  BILITOT 1.0  PROT 5.6*  ALBUMIN 3.0*   No results for input(s): LIPASE, AMYLASE in the last 168 hours. No results for input(s): AMMONIA in the last 168 hours. ABG    Component Value Date/Time   PHART 7.536 (H) 12/25/2017 0230   PCO2ART 30.7 (L) 12/25/2017 0230   PO2ART 88.0 12/25/2017 0230   HCO3 25.9 12/25/2017 0230   TCO2 27 12/25/2017 0230   ACIDBASEDEF 1.0 12/24/2017 0537   O2SAT 98.0 12/25/2017 0230    Coagulation Profile: Recent Labs  Lab 12/24/17 1023  INR 1.16   Cardiac Enzymes: Recent Labs  Lab 12/24/17 1023 12/24/17 1358 12/24/17 2055  TROPONINI 0.17* 0.20* 0.18*   HbA1C: Hgb A1c MFr Bld  Date/Time Value Ref Range Status  06/14/2016 02:44 AM 5.0 4.8 - 5.6 % Final    Comment:    (NOTE)         Pre-diabetes: 5.7 - 6.4  Diabetes: >6.4         Glycemic control for adults with diabetes: <7.0   12/15/2014 01:27 AM 5.5 4.8 - 5.6 % Final    Comment:    (NOTE)         Pre-diabetes: 5.7 - 6.4         Diabetes: >6.4         Glycemic control for adults with diabetes: <7.0    CBG: Recent Labs  Lab 12/24/17 1519 12/24/17 1945 12/24/17 2318 12/25/17 0353 12/25/17 0722  GLUCAP 85 88 98 101* 95   Allergies Allergies  Allergen Reactions  . Nubain [Nalbuphine Hcl] Hives    Went into cardiac arrest   . Codeine Hives and Nausea Only    Has tolerated Oxycodone  . Darvocet [Propoxyphene N-Acetaminophen] Nausea Only    Home meds  Prior to Admission medications   Medication Sig Start Date End Date Taking? Authorizing Provider  allopurinol (ZYLOPRIM) 100 MG tablet Take 100 mg by mouth daily.   Yes [provider]  aluminum-magnesium hydroxide-simethicone (MAALOX) 200-200-20 MG/5ML SUSP Take 30 mLs by mouth 3 (three) times daily as needed (for indegestion).   Yes [provider]  amiodarone (PACERONE) 100 MG tablet Take  100 mg by mouth daily.   Yes [provider]  atorvastatin (LIPITOR) 10 MG tablet Take 10 mg by mouth daily at 6 PM.    Yes [provider]  calcitRIOL (ROCALTROL) 0.25 MCG capsule Take 0.25 mcg by mouth daily.    Yes [provider]  cholecalciferol (VITAMIN D) 1000 units tablet Take 2,000 Units by mouth at bedtime.   Yes [provider]  cycloSPORINE (RESTASIS) 0.05 % ophthalmic emulsion Place 1 drop into both eyes 2 (two) times daily.    Yes [provider]  docusate sodium (COLACE) 100 MG capsule Take 1 capsule (100 mg total) by mouth 2 (two) times daily. 12/08/17  Yes Leroy Sea, MD  donepezil (ARICEPT) 10 MG tablet Take 10 mg by mouth at bedtime.    Yes [provider]  DULoxetine (CYMBALTA) 60 MG capsule Take 60 mg by mouth daily at 6 PM.    Yes [provider]  ferrous sulfate 325 (65 FE) MG tablet Take 325 mg by mouth daily.    Yes [provider]  gabapentin (NEURONTIN) 100 MG capsule Take 200 mg by mouth 2 (two) times daily.   Yes [provider]  ipratropium-albuterol (DUONEB) 0.5-2.5 (3) MG/3ML SOLN Take 3 mLs by nebulization every 6 (six) hours as needed (wheezing/shortness of breath).   Yes [provider]  ipratropium-albuterol (DUONEB) 0.5-2.5 (3) MG/3ML SOLN Take 3 mLs by nebulization 2 (two) times daily.   Yes [provider]  isosorbide mononitrate (IMDUR) 30 MG 24 hr tablet Take 1 tablet (30 mg total) by mouth daily. Patient taking differently: Take 60 mg by mouth daily.  08/05/16 12/24/25 Yes Bhagat, Bhavinkumar, PA  levothyroxine (SYNTHROID, LEVOTHROID) 100 MCG tablet Take 1 tablet (100 mcg total) by mouth daily before breakfast. 11/12/17  Yes Berton Mount I, MD  lidocaine (LIDODERM) 5 % Place 1 patch onto the skin daily. Apply to lower back as needed for pain.   Yes [provider]  linaclotide (LINZESS) 290 MCG CAPS capsule Take 290 mcg by mouth daily.    Yes  [provider]  Menthol, Topical Analgesic, (BIOFREEZE) 4 % GEL Apply 1 application topically 2 (two) times daily.   Yes [provider]  metolazone (ZAROXOLYN) 2.5 MG  tablet 1 pill every Wednesday as needed once a week for weight gain over 2.5 pounds, edema or shortness of breath. Patient taking differently: Take 2.5 mg by mouth See admin instructions. 1 pill every Wednesday as needed once a week for weight gain over 2.5 pounds, edema or shortness of breath. 12/08/17  Yes Leroy Sea, MD  metoprolol tartrate (LOPRESSOR) 25 MG tablet Take 0.5 tablets (12.5 mg total) by mouth 2 (two) times daily. 12/08/17  Yes Leroy Sea, MD  morphine 20 MG/5ML solution Take 10 mg by mouth every 2 (two) hours as needed for pain.   Yes [provider]  Multiple Vitamin (MULTIVITAMIN WITH MINERALS) TABS tablet Take 1 tablet by mouth daily.   Yes [provider]  mupirocin ointment (BACTROBAN) 2 % Place 1 application into the nose 2 (two) times daily. 11/12/17  Yes Barnetta Chapel, MD  nitroGLYCERIN (NITROSTAT) 0.4 MG SL tablet Place 0.4 mg under the tongue every 5 (five) minutes as needed for chest pain (MAX 3 TABLETS).    Yes [provider]  ondansetron (ZOFRAN) 4 MG tablet Take 4 mg by mouth every 8 (eight) hours as needed for nausea or vomiting.   Yes [provider]  polyethylene glycol (MIRALAX / GLYCOLAX) packet Take 17 g by mouth daily.   Yes [provider]  torsemide (DEMADEX) 20 MG tablet Take 40 mg by mouth daily.    Yes [provider]  warfarin (COUMADIN) 3 MG tablet Take 3 mg by mouth daily.   Yes [provider]  bisacodyl (DULCOLAX) 10 MG suppository Place 1 suppository (10 mg total) rectally daily as needed for moderate constipation. Patient not taking: Reported on 12/24/2017 12/08/17   Leroy Sea, MD  HYDROcodone-acetaminophen (NORCO/VICODIN) 5-325 MG tablet Take 1 tablet by mouth every 4 (four) hours as  needed for severe pain. Patient not taking: Reported on 12/24/2017 11/12/17   Berton Mount I, MD    The patient is critically ill with multiple organ systems failure and requires high complexity decision making for assessment and support, frequent evaluation and titration of therapies, application of advanced monitoring technologies and extensive interpretation of multiple databases.   Critical Care Time devoted to patient care services described in this note is  38  Minutes. This time reflects time of care of this signee Dr Koren Bound. This critical care time does not reflect procedure time, or teaching time or supervisory time of PA/NP/Med student/Med Resident etc but could involve care discussion time.  Alyson Reedy, M.D. [facility] Pulmonary/Critical Care Medicine. Pager: [phone]. After hours pager: [phone].  12/25/2017, 11:28 AM

## 2017-12-25 NOTE — Progress Notes (Signed)
eLink Physician-Brief Progress Note Patient Name: [name] DOB: Oct 05, 1932 MRN: [id number]   Date of Service  12/25/2017  HPI/Events of Note  Urinary retention  eICU Interventions  In/ Out urinary bladder catheterization        Okoronkwo U Ogan 12/25/2017, 6:00 AM

## 2017-12-25 NOTE — Progress Notes (Signed)
eLink Physician-Brief Progress Note Patient Name: [name] DOB: 02/10/1933 MRN: [id number]   Date of Service  12/25/2017  HPI/Events of Note  Pt is a very hard stick and needs vascular access for blood draws to monitor PTT on heparin, as well as other labs. Pt has advanced renal failure but her age and co-morbidities probably preclude future consideration of A-V fistula for dialysis access. Advanced renal failure may relatively contraindicate sq Lovenox as an alternative to iv Heparin.  eICU Interventions  Triple lumen PICC line ordered.        Thomasene Lot Ogan 12/25/2017, 10:51 PM

## 2017-12-25 NOTE — Progress Notes (Signed)
Patient has not been voiding throughout shift. Found to have after bladder scan. Notified Elink MD which then gave orders to straight catheterize patient. 350 ML of urine returned after straight cath.

## 2017-12-25 NOTE — Progress Notes (Addendum)
HPI: I was asked by Dr. Alroy Dust to see [name] who is a 84 y.o. female with COPD, paroxysmal atrial fibrillation on coumadin, combined heart failure (EF 20%), severe aortic stenosis, CKD IV, dementia who presents with acute shortness of breath. She was placed on bipap initially and then due to not being able to tolerate it she was subsequently intubated.   She was consulted by critical care Dr. Nelda Marseille for management of acute on chronic kidney injury. She is followed by Dr. Posey Pronto outpatient.  Past Medical History:  Diagnosis Date  . Acute on chronic renal failure [facility])    sees Dr Posey Pronto   . Anemia    Acute blood loss  . Aortic regurgitation   . Aortic stenosis 10/13/2012   Low EF, low gradient with severe aortic stenosis confirmed by dobutamine stress echocardiogram s/p TAVR 12/2012  . Asthma   . Atrial fibrillation (HCC)    tachy-brady syndrome with <1% recurrent PAF since pacemaker placement  . Cataracts, bilateral   . Chronic combined systolic and diastolic CHF (congestive heart failure) ([place])   . Chronic lower back pain   . CKD (chronic kidney disease)   . Coronary artery disease involving native coronary artery of native heart   . Dementia    Without behavioral disturbance  . Dysrhythmia   . Fibromyalgia   . Gastroesophageal reflux disease   . H/O dizziness   . H/O urinary frequency   . H/O: stroke   . Hard of hearing   . Headache    hx of migraines   . Heart murmur   . History of blood transfusion   . History of bronchitis   . History of kidney stones   . History of urinary tract infection   . HLD (hyperlipidemia)   . HTN (hypertension)   . Hypothyroidism   . Neuropathy   . Nonischemic cardiomyopathy ([place])   . On home oxygen therapy    patient uses at nite- 2L- has not used in > 6 months per patient  . Osteoarthritis   . Osteoporosis   . Pneumonia    hx of x 3   . PONV (postoperative nausea and vomiting)   . Presence of permanent cardiac pacemaker   .  Pulmonary embolism (HCC)    HISTORY OF, the pt. had a recurrent bilateral pulmonary emboli in 2005, on warfarin therapy and at which time she under went implantation of IVC filter  . Repeated falls   . Rupture of right patellar tendon   . Sciatica   . Shortness of breath dyspnea    with exertion   . Sleep apnea    uses oxygen at night and PRN- not used since > 6 months / DOES NOT USE  C-PAP  . Spinal stenosis   . Stroke ([place])   . Symptomatic bradycardia   . Symptomatic bradycardia 2012   s/p Medtronic PPM  . Syncope   . Urinary incontinence   . Urinary urgency    Past Surgical History:  Procedure Laterality Date  . ABDOMINAL HYSTERECTOMY    . APPENDECTOMY    . BACK SURGERY    . BUNIONECTOMY    . CARDIAC CATHETERIZATION    . CATARACT EXTRACTION    . CENTRAL VENOUS CATHETER INSERTION Left 12/21/2012   Procedure: INSERTION CENTRAL LINE ADULT;  Surgeon: Sherren Mocha, MD;  Location: [facility];  Service: Open Heart Surgery;  Laterality: Left;  . ELBOW SURGERY     bilat   . INTRAOPERATIVE TRANSESOPHAGEAL  ECHOCARDIOGRAM N/A 12/21/2012   Procedure: INTRAOPERATIVE TRANSESOPHAGEAL ECHOCARDIOGRAM;  Surgeon: Sherren Mocha, MD;  Location: [place];  Service: Open Heart Surgery;  Laterality: N/A;  . KNEE SURGERY Left   . LEFT AND RIGHT HEART CATHETERIZATION WITH CORONARY ANGIOGRAM N/A 10/18/2012   Procedure: LEFT AND RIGHT HEART CATHETERIZATION WITH CORONARY ANGIOGRAM;  Surgeon: Sherren Mocha, MD;  Location: [facility] CATH LAB;  Service: Cardiovascular;  Laterality: N/A;  . NASAL SEPTUM SURGERY    . NOSE SURGERY     X 2  . ORIF PATELLA Right 03/08/2015   Procedure:  OPEN REDUCTION INTERNAL FIXATION RIGHT  PATELLA TENDON AVULSION;  Surgeon: Paralee Cancel, MD;  Location: [facility];  Service: Orthopedics;  Laterality: Right;  . PACEMAKER INSERTION     Medtronic  . PATELLAR TENDON REPAIR Right 06/25/2015   Procedure: RIGHT PATELLA TENDON REVISION/REPAIR;  Surgeon: Paralee Cancel, MD;  Location: [facility];  Service:  Orthopedics;  Laterality: Right;  . SHOULDER SURGERY     bilat   . THYROIDECTOMY, PARTIAL    . TONSILLECTOMY    . TOTAL KNEE ARTHROPLASTY Right 12/04/2014   Procedure: RIGHT TOTAL  KNEE ARTHROPLASTY;  Surgeon: Paralee Cancel, MD;  Location[facility];  Service: Orthopedics;  Laterality: Right;  . TRANSCATHETER AORTIC VALVE REPLACEMENT, TRANSFEMORAL  12/21/2012   a. 67m Edwards Sapien XT transcatheter heart valve placed via open left transfemoral approach b. Intra-op TEE: well-seated bioprosthetic aortic valve with mean gradient 2 mmHg, trivial AI, mild MR, EF 30-35%  . TRANSCATHETER AORTIC VALVE REPLACEMENT, TRANSFEMORAL N/A 12/21/2012   Procedure: TRANSCATHETER AORTIC VALVE REPLACEMENT, TRANSFEMORAL;  Surgeon: MSherren Mocha MD;  [place]  Service: Open Heart Surgery;  Laterality: N/A;   Social History:  reports that she has never smoked. She has never used smokeless tobacco. She reports that she does not drink alcohol or use drugs. Allergies:  Allergies  Allergen Reactions  . Nubain [Nalbuphine Hcl] Hives    Went into cardiac arrest   . Codeine Hives and Nausea Only    Has tolerated Oxycodone  . Darvocet [Propoxyphene N-Acetaminophen] Nausea Only   Family History  Problem Relation Age of Onset  . Ovarian cancer Mother        Deceased  . Epilepsy Father        Deceased    Medications: I have reviewed the patient's current medications.   ROS:  Agitated, irritated about tube in throat  Blood pressure (!) 113/50, pulse 83, temperature 98.9 F (37.2 C), temperature source Oral, resp. rate 18, height '5\' 4"'$  (1.626 m), weight 73 kg, SpO2 100 %.  Physical Exam  Constitutional: She appears well-developed and well-nourished. She is active and cooperative. She appears distressed.  HENT:  Head: Normocephalic and atraumatic.  Eyes: Conjunctivae are normal.  Cardiovascular: Normal rate, regular rhythm and normal heart sounds.  No murmur heard. Respiratory: [place] normal and breath  sounds normal. No respiratory distress. She has no wheezes.  GI: Soft. Bowel sounds are normal. She exhibits no distension. There is no tenderness.  Musculoskeletal: She exhibits no edema.  Neurological: She is alert.  Psychiatric: Her mood appears anxious. She is agitated and aggressive.    Results for orders placed or performed during the hospital encounter of 12/24/17 (from the past 48 hour(s))  I-Stat arterial blood gas, ED     Status: Abnormal   Collection Time: 12/24/17  5:37 AM  Result Value Ref Range   pH, Arterial 7.309 (L) 7.350 - 7.450   pCO2 arterial 51.5 (H) 32.0 -  48.0 mmHg   pO2, Arterial 81.0 (L) 83.0 - 108.0 mmHg   Bicarbonate 26.0 20.0 - 28.0 mmol/L   TCO2 28 22 - 32 mmol/L   O2 Saturation 95.0 %   Acid-base deficit 1.0 0.0 - 2.0 mmol/L   Patient temperature 98.0 F    Collection site RADIAL, ALLEN'S TEST ACCEPTABLE    Drawn by RT    Sample type ARTERIAL   Basic metabolic panel     Status: Abnormal   Collection Time: 12/24/17  5:52 AM  Result Value Ref Range   Sodium 141 135 - 145 mmol/L   Potassium 3.6 3.5 - 5.1 mmol/L    Comment: HEMOLYSIS AT THIS LEVEL MAY AFFECT RESULT   Chloride 98 98 - 111 mmol/L   CO2 23 22 - 32 mmol/L   Glucose, Bld 257 (H) 70 - 99 mg/dL   BUN 69 (H) 8 - 23 mg/dL   Creatinine, Ser 2.81 (H) 0.44 - 1.00 mg/dL   Calcium 9.5 8.9 - 10.3 mg/dL   GFR calc non Af Amer 15 (L) >60 mL/min   GFR calc Af Amer 17 (L) >60 mL/min    Comment: (NOTE) The eGFR has been calculated using the CKD EPI equation. This calculation has not been validated in all clinical situations. eGFR's persistently <60 mL/min signify possible Chronic Kidney Disease.    Anion gap 20 (H) 5 - 15    Comment: Performed at [facility], [place] [address], [place], [place] [address]  CBC     Status: Abnormal   Collection Time: 12/24/17  5:52 AM  Result Value Ref Range   WBC 12.5 (H) 4.0 - 10.5 K/uL   RBC 3.82 (L) 3.87 - 5.11 MIL/uL   Hemoglobin 11.9 (L) 12.0 - 15.0 g/dL    HCT 39.0 36.0 - 46.0 %   MCV 102.1 (H) 78.0 - 100.0 fL   MCH 31.2 26.0 - 34.0 pg   MCHC 30.5 30.0 - 36.0 g/dL   RDW 14.9 11.5 - 15.5 %   Platelets 483 (H) 150 - 400 K/uL    Comment: Performed at [facility], [place] [address] [place], [place] [address]  I-Stat CG4 Lactic Acid, ED  (not at  [facility])     Status: Abnormal   Collection Time: 12/24/17  6:17 AM  Result Value Ref Range   Lactic Acid, Venous 6.90 (HH) 0.5 - 1.9 mmol/L   Comment NOTIFIED PHYSICIAN   I-Stat arterial blood gas, ED     Status: Abnormal   Collection Time: 12/24/17  8:13 AM  Result Value Ref Range   pH, Arterial 7.305 (L) 7.350 - 7.450   pCO2 arterial 63.0 (H) 32.0 - 48.0 mmHg   pO2, Arterial 117.0 (H) 83.0 - 108.0 mmHg   Bicarbonate 31.5 (H) 20.0 - 28.0 mmol/L   TCO2 33 (H) 22 - 32 mmol/L   O2 Saturation 98.0 %   Acid-Base Excess 3.0 (H) 0.0 - 2.0 mmol/L   Patient temperature 97.9 F    Collection site RADIAL, ALLEN'S TEST ACCEPTABLE    Drawn by RT    Sample type ARTERIAL   Glucose, capillary     Status: Abnormal   Collection Time: 12/24/17  9:25 AM  Result Value Ref Range   Glucose-Capillary 154 (H) 70 - 99 mg/dL  MRSA PCR Screening     Status: None   Collection Time: 12/24/17 10:03 AM  Result Value Ref Range   MRSA by PCR NEGATIVE NEGATIVE    Comment:  The GeneXpert MRSA Assay (FDA approved for NASAL specimens only), is one component of a comprehensive MRSA colonization surveillance program. It is not intended to diagnose MRSA infection nor to guide or monitor treatment for MRSA infections. Performed at [facility], [place] [address] [place], [place] [address]   Triglycerides     Status: Abnormal   Collection Time: 12/24/17 10:23 AM  Result Value Ref Range   Triglycerides 196 (H) <150 mg/dL    Comment: Performed at [facility] [address] [place], [place] [address]  Comprehensive metabolic panel     Status: Abnormal   Collection Time: 12/24/17 10:23 AM  Result  Value Ref Range   Sodium 142 135 - 145 mmol/L   Potassium 3.2 (L) 3.5 - 5.1 mmol/L   Chloride 99 98 - 111 mmol/L   CO2 27 22 - 32 mmol/L   Glucose, Bld 156 (H) 70 - 99 mg/dL   BUN 70 (H) 8 - 23 mg/dL   Creatinine, Ser 2.74 (H) 0.44 - 1.00 mg/dL   Calcium 9.5 8.9 - 10.3 mg/dL   Total Protein 5.6 (L) 6.5 - 8.1 g/dL   Albumin 3.0 (L) 3.5 - 5.0 g/dL   AST 33 15 - 41 U/L   ALT 16 0 - 44 U/L   Alkaline Phosphatase 66 38 - 126 U/L   Total Bilirubin 1.0 0.3 - 1.2 mg/dL   GFR calc non Af Amer 15 (L) >60 mL/min   GFR calc Af Amer 17 (L) >60 mL/min    Comment: (NOTE) The eGFR has been calculated using the CKD EPI equation. This calculation has not been validated in all clinical situations. eGFR's persistently <60 mL/min signify possible Chronic Kidney Disease.    Anion gap 16 (H) 5 - 15    Comment: Performed at [facility], [place] [address] [place], [place] [address]  Magnesium     Status: Abnormal   Collection Time: 12/24/17 10:23 AM  Result Value Ref Range   Magnesium 1.5 (L) 1.7 - 2.4 mg/dL    Comment: Performed at [facility] [address] [place], [place] [address]  Phosphorus     Status: None   Collection Time: 12/24/17 10:23 AM  Result Value Ref Range   Phosphorus 4.6 2.5 - 4.6 mg/dL    Comment: Performed at [facility] [address], [place], [place] [address]  Procalcitonin     Status: None   Collection Time: 12/24/17 10:23 AM  Result Value Ref Range   Procalcitonin 1.38 ng/mL    Comment:        Interpretation: PCT > 0.5 ng/mL and <= 2 ng/mL: Systemic infection (sepsis) is possible, but other conditions are known to elevate PCT as well. (NOTE)       Sepsis PCT Algorithm           Lower Respiratory Tract                                      Infection PCT Algorithm    ----------------------------     ----------------------------         PCT < 0.25 ng/mL                PCT < 0.10 ng/mL         Strongly encourage             Strongly  discourage    discontinuation of antibiotics    initiation of antibiotics    ----------------------------     -----------------------------       PCT 0.25 - 0.50 ng/mL            PCT 0.10 - 0.25 ng/mL               OR       >80% decrease in PCT            Discourage initiation of                                            antibiotics      Encourage discontinuation           of antibiotics    ----------------------------     -----------------------------         PCT >= 0.50 ng/mL              PCT 0.26 - 0.50 ng/mL                AND       <80% decrease in PCT             Encourage initiation of                                             antibiotics       Encourage continuation           of antibiotics    ----------------------------     -----------------------------        PCT >= 0.50 ng/mL                  PCT > 0.50 ng/mL               AND         increase in PCT                  Strongly encourage                                      initiation of antibiotics    Strongly encourage escalation           of antibiotics                                     -----------------------------                                           PCT <= 0.25 ng/mL                                                 OR                                        >  80% decrease in PCT                                     Discontinue / Do not initiate                                             antibiotics Performed at [facility], [place] [address] [place], [place] [address]   Cortisol     Status: None   Collection Time: 12/24/17 10:23 AM  Result Value Ref Range   Cortisol, Plasma 27.9 ug/dL    Comment: (NOTE) AM    6.7 - 22.6 ug/dL PM   <10.0       ug/dL Performed at [facility] [address], [place], [place] [address]   CBC     Status: Abnormal   Collection Time: 12/24/17 10:23 AM  Result Value Ref Range   WBC 18.6 (H) 4.0 - 10.5 K/uL   RBC 3.41 (L) 3.87 - 5.11 MIL/uL   Hemoglobin 10.7 (L) 12.0 -  15.0 g/dL   HCT 34.3 (L) 36.0 - 46.0 %   MCV 100.6 (H) 78.0 - 100.0 fL   MCH 31.4 26.0 - 34.0 pg   MCHC 31.2 30.0 - 36.0 g/dL   RDW 14.7 11.5 - 15.5 %   Platelets 335 150 - 400 K/uL    Comment: Performed at [facility], [place] [address] [place], [place] [address]  Protime-INR     Status: None   Collection Time: 12/24/17 10:23 AM  Result Value Ref Range   Prothrombin Time 14.7 11.4 - 15.2 seconds   INR 1.16     Comment: Performed at [facility] [address] [place], [place] [address]  APTT     Status: None   Collection Time: 12/24/17 10:23 AM  Result Value Ref Range   aPTT 28 24 - 36 seconds    Comment: Performed at [facility] [address], [place], [place] [address]  Troponin I     Status: Abnormal   Collection Time: 12/24/17 10:23 AM  Result Value Ref Range   Troponin I 0.17 (HH) <0.03 ng/mL    Comment: CRITICAL RESULT CALLED TO, READ BACK BY AND VERIFIED WITH: DICKENSON,P RN @ 1148/ 12/24/17 LEONARD,A Performed at [facility], [place] [address] [place], [place] [address]   TSH     Status: Abnormal   Collection Time: 12/24/17 10:23 AM  Result Value Ref Range   TSH 8.453 (H) 0.350 - 4.500 uIU/mL    Comment: Performed by a 3rd Generation assay with a functional sensitivity of <=0.01 uIU/mL. Performed at [facility], [place] [address] [place], [place] [address]   Lactic acid, plasma     Status: Abnormal   Collection Time: 12/24/17 10:32 AM  Result Value Ref Range   Lactic Acid, Venous 2.8 (HH) 0.5 - 1.9 mmol/L    Comment: CRITICAL RESULT CALLED TO, READ BACK BY AND VERIFIED WITH: DICKENSON,P RN @ 1146 12/24/17 LEONARD,A Performed at [facility], [place] [address] [place], [place] [address]   Glucose, capillary     Status: Abnormal   Collection Time: 12/24/17 11:28 AM  Result Value Ref Range   Glucose-Capillary 133 (H) 70 - 99 mg/dL  Troponin I     Status: Abnormal   Collection Time: 12/24/17  1:58 PM  Result Value Ref Range    Troponin I 0.20 (HH) <0.03 ng/mL    Comment: CRITICAL VALUE NOTED.  VALUE IS CONSISTENT WITH PREVIOUSLY REPORTED AND CALLED VALUE. Performed at [facility], [place] [address], [place], [place] [address]   Brain natriuretic peptide     Status: Abnormal   Collection Time: 12/24/17  1:58 PM  Result Value Ref Range   B Natriuretic Peptide 779.1 (H) 0.0 - 100.0 pg/mL    Comment: Performed at [facility] [address] [place], [place] [address]  Glucose, capillary     Status: None   Collection Time: 12/24/17  3:19 PM  Result Value Ref Range   Glucose-Capillary 85 70 - 99 mg/dL  Glucose, capillary     Status: None   Collection Time: 12/24/17  7:45 PM  Result Value Ref Range   Glucose-Capillary 88 70 - 99 mg/dL  Troponin I     Status: Abnormal   Collection Time: 12/24/17  8:55 PM  Result Value Ref Range   Troponin I 0.18 (HH) <0.03 ng/mL    Comment: CRITICAL VALUE NOTED.  VALUE IS CONSISTENT WITH PREVIOUSLY REPORTED AND CALLED VALUE. Performed at [facility], [place] [address] [place], [place] [address]   Glucose, capillary     Status: None   Collection Time: 12/24/17 11:18 PM  Result Value Ref Range   Glucose-Capillary 98 70 - 99 mg/dL  I-STAT 3, arterial blood gas (G3+)     Status: Abnormal   Collection Time: 12/25/17  2:30 AM  Result Value Ref Range   pH, Arterial 7.536 (H) 7.350 - 7.450   pCO2 arterial 30.7 (L) 32.0 - 48.0 mmHg   pO2, Arterial 88.0 83.0 - 108.0 mmHg   Bicarbonate 25.9 20.0 - 28.0 mmol/L   TCO2 27 22 - 32 mmol/L   O2 Saturation 98.0 %   Acid-Base Excess 3.0 (H) 0.0 - 2.0 mmol/L   Patient temperature 99.2 F    Collection site RADIAL, ALLEN'S TEST ACCEPTABLE    Drawn by RT    Sample type ARTERIAL   Glucose, capillary     Status: Abnormal   Collection Time: 12/25/17  3:53 AM  Result Value Ref Range   Glucose-Capillary 101 (H) 70 - 99 mg/dL  Glucose, capillary     Status: None   Collection Time: 12/25/17  7:22 AM  Result Value Ref Range    Glucose-Capillary 95 70 - 99 mg/dL  CBC     Status: Abnormal   Collection Time: 12/25/17  8:53 AM  Result Value Ref Range   WBC 13.1 (H) 4.0 - 10.5 K/uL   RBC 3.11 (L) 3.87 - 5.11 MIL/uL   Hemoglobin 9.7 (L) 12.0 - 15.0 g/dL   HCT 30.2 (L) 36.0 - 46.0 %   MCV 97.1 78.0 - 100.0 fL   MCH 31.2 26.0 - 34.0 pg   MCHC 32.1 30.0 - 36.0 g/dL   RDW 15.5 11.5 - 15.5 %   Platelets 358 150 - 400 K/uL    Comment: Performed at [facility], [address] [place], [place] [address]  Basic metabolic panel     Status: Abnormal   Collection Time: 12/25/17  8:53 AM  Result Value Ref Range   Sodium 142 135 - 145 mmol/L   Potassium 3.2 (L) 3.5 - 5.1 mmol/L   Chloride 107 98 - 111 mmol/L  CO2 23 22 - 32 mmol/L   Glucose, Bld 109 (H) 70 - 99 mg/dL   BUN 67 (H) 8 - 23 mg/dL   Creatinine, Ser 3.45 (H) 0.44 - 1.00 mg/dL   Calcium 8.8 (L) 8.9 - 10.3 mg/dL   GFR calc non Af Amer 11 (L) >60 mL/min   GFR calc Af Amer 13 (L) >60 mL/min    Comment: (NOTE) The eGFR has been calculated using the CKD EPI equation. This calculation has not been validated in all clinical situations. eGFR's persistently <60 mL/min signify possible Chronic Kidney Disease.    Anion gap 12 5 - 15    Comment: Performed at [facility] [address] [place], [place] [address]  Magnesium     Status: Abnormal   Collection Time: 12/25/17  8:53 AM  Result Value Ref Range   Magnesium 1.5 (L) 1.7 - 2.4 mg/dL    Comment: Performed at [facility] [address], [place], [place] [address]  Phosphorus     Status: None   Collection Time: 12/25/17  8:53 AM  Result Value Ref Range   Phosphorus 3.7 2.5 - 4.6 mg/dL    Comment: Performed at [facility] [address], [place], [place] [address]  Glucose, capillary     Status: Abnormal   Collection Time: 12/25/17 12:24 PM  Result Value Ref Range   Glucose-Capillary 116 (H) 70 - 99 mg/dL   Ct Chest Wo Contrast  Result Date: 12/24/2017 CLINICAL DATA:  Status  post intubation. Respiratory failure. Elevated white blood cell count. EXAM: CT CHEST WITHOUT CONTRAST TECHNIQUE: Multidetector CT imaging of the chest was performed following the standard protocol without IV contrast. COMPARISON:  11/03/2017 FINDINGS: Cardiovascular: Normal caliber thoracic aorta with atherosclerosis. Prior TAVR. Stable cardiomegaly. Dual lead cardiac pacemaker. Mediastinum/Nodes: Mediastinum is suboptimally evaluated secondary to lack of intravenous contrast. Trachea, esophagus and thyroid gland demonstrate no focal abnormality. Bilateral hilar lymphadenopathy. Nasogastric tube coursing below the diaphragm within the stomach. Endotracheal tube in satisfactory position. Lungs/Pleura: Patchy airspace disease in the right upper lobe, left upper lobe, right middle lobe and bilateral lower lobes, right worse than left. Bilateral small pleural effusions. Upper Abdomen: No acute upper abdominal abnormality. Musculoskeletal: No aggressive osseous lesion. No acute osseous abnormality. Mild thoracolumbar spondylosis. Partially visualize chondrocalcinosis of right shoulder. Mild osteoarthritis of the right glenohumeral joint. S-shaped scoliosis of the thoracolumbar spine. IMPRESSION: 1. Bilateral multilobar pneumonia. 2. Endotracheal tube in satisfactory position. 3.  Aortic Atherosclerosis (ICD10-I70.0). Electronically Signed   By: Kathreen Devoid   On: 12/24/2017 08:32   Dg Chest Port 1 View  Result Date: 12/25/2017 CLINICAL DATA:  Respiratory failure EXAM: PORTABLE CHEST 1 VIEW COMPARISON:  Chest x-rays dated 12/24/2017 and 12/18/2017. FINDINGS: Today's study is hypoinspiratory. Increasingly dense opacity at the RIGHT lung base, possibly accentuated by the low lung volumes on today's study. Persistent opacity at the LEFT lung base. No pneumothorax seen. Endotracheal tube is well positioned with tip approximately 2 cm above the carina. Enteric tube passes below the diaphragm. Dual lead pacemaker/ICD  apparatus appears stable in position. Valvular hardware appears stable in position. Heart size and mediastinal contours appear stable. IMPRESSION: 1. Hypoinspiratory exam. Increasingly dense opacity within the RIGHT lower lung, compatible with the pneumonia at described on chest CT of 12/24/2017, possibly worsened but more likely accentuated by the low lung volumes on today's study. 2. Stable opacity at the LEFT lung base, pneumonia versus atelectasis, as also demonstrated on yesterday's chest  CT. 3. Endotracheal tube appears well positioned with tip just above the level of the carina. Electronically Signed   By: Franki Cabot M.D.   On: 12/25/2017 09:19   Dg Chest Port 1 View  Result Date: 12/24/2017 CLINICAL DATA:  Endotracheal tube placement. OG present. History of CHF, coronary artery disease, hypertension, pacemaker. EXAM: PORTABLE CHEST 1 VIEW COMPARISON:  Chest x-ray from earlier same day. Chest x-ray dated 12/18/2017. FINDINGS: Endotracheal tube appears well positioned with tip approximately 3 cm above the carina. Enteric tube passes below the diaphragm. Pacemaker leads appear grossly stable in position. Heart size and mediastinal contours are stable. Atherosclerotic changes noted at the aortic arch. Aortic valve hardware appears stable in position. Worsening pulmonary edema pattern bilaterally. Probable small bilateral pleural effusions. Probable atelectasis at the LEFT lung base. No pneumothorax seen. IMPRESSION: 1. Endotracheal tube is well positioned with tip approximately 3 cm above the carina. 2. OG tube passes below the diaphragm. 3. Worsening pulmonary edema. 4. Probable small bilateral pleural effusions and LEFT basilar atelectasis. 5. Stable mild cardiomegaly. Electronically Signed   By: Franki Cabot M.D.   On: 12/24/2017 07:55   Dg Chest Portable 1 View  Result Date: 12/24/2017 CLINICAL DATA:  Shortness of breath and chest pain today. EXAM: PORTABLE CHEST 1 VIEW COMPARISON:  12/18/2017  FINDINGS: Cardiac pacemaker. Coronary valve prosthesis. Mild cardiac enlargement. Bilateral perihilar infiltration and airspace disease, more prominent on the right. This may represent edema or multifocal pneumonia. Appearances are more prominent than on the previous study. Probable small left pleural effusion. No pneumothorax. Calcification of the aorta. IMPRESSION: Cardiac enlargement with bilateral perihilar infiltration and small left pleural effusion. Progression since previous study. Electronically Signed   By: Lucienne Capers M.D.   On: 12/24/2017 06:05    Assessment:  Acute on Chronic Kidney Injury  The patient's kidney function has acutely declined over the past 24 hours. Her creatinine is 3.45 today from prior baseline 2.3-2.5. Patient's aki is likely pre-renal due to poor perfusion to the kidneys. Patient has leukocytosis, lactic acidosis, but is afebrile. Blood and urine cultures are pending.   Patient has not been on any diuretics since admission. Please continue to hold. Patient does not have urgent need for dialysis at this time. If she were to develop hyperkalemia, metabolic acidosis, or worsening renal failure then will consider at that time.    Vahini Chundi 12/25/2017, 12:50 PM   Renal Attending: I suspect she has hemodynamically mediated AKI on CKI.  She has volume overload with pulmonary compromise.  We will increase diuretics to attempt to mobilize fluids. Erling Cruz, MD

## 2017-12-25 NOTE — Progress Notes (Signed)
Vent changes made by MD. 

## 2017-12-26 ENCOUNTER — Inpatient Hospital Stay (HOSPITAL_COMMUNITY): Payer: Medicare Other

## 2017-12-26 DIAGNOSIS — I480 Paroxysmal atrial fibrillation: Secondary | ICD-10-CM

## 2017-12-26 DIAGNOSIS — N185 Chronic kidney disease, stage 5: Secondary | ICD-10-CM

## 2017-12-26 DIAGNOSIS — I248 Other forms of acute ischemic heart disease: Secondary | ICD-10-CM

## 2017-12-26 DIAGNOSIS — J810 Acute pulmonary edema: Secondary | ICD-10-CM

## 2017-12-26 DIAGNOSIS — E876 Hypokalemia: Secondary | ICD-10-CM

## 2017-12-26 DIAGNOSIS — I5043 Acute on chronic combined systolic (congestive) and diastolic (congestive) heart failure: Secondary | ICD-10-CM

## 2017-12-26 DIAGNOSIS — N171 Acute kidney failure with acute cortical necrosis: Secondary | ICD-10-CM

## 2017-12-26 DIAGNOSIS — N184 Chronic kidney disease, stage 4 (severe): Secondary | ICD-10-CM

## 2017-12-26 LAB — CBC
HCT: 34.7 % — ABNORMAL LOW (ref 36.0–46.0)
Hemoglobin: 11.3 g/dL — ABNORMAL LOW (ref 12.0–15.0)
MCH: 31.7 pg (ref 26.0–34.0)
MCHC: 32.6 g/dL (ref 30.0–36.0)
MCV: 97.2 fL (ref 78.0–100.0)
Platelets: 300 10*3/uL (ref 150–400)
RBC: 3.57 MIL/uL — ABNORMAL LOW (ref 3.87–5.11)
RDW: 15.4 % (ref 11.5–15.5)
WBC: 14.6 10*3/uL — AB (ref 4.0–10.5)

## 2017-12-26 LAB — URINALYSIS, ROUTINE W REFLEX MICROSCOPIC
BILIRUBIN URINE: NEGATIVE
Glucose, UA: NEGATIVE mg/dL
KETONES UR: NEGATIVE mg/dL
NITRITE: NEGATIVE
PROTEIN: NEGATIVE mg/dL
Specific Gravity, Urine: 1.008 (ref 1.005–1.030)
pH: 5 (ref 5.0–8.0)

## 2017-12-26 LAB — POCT I-STAT 3, ART BLOOD GAS (G3+)
ACID-BASE DEFICIT: 1 mmol/L (ref 0.0–2.0)
BICARBONATE: 23.7 mmol/L (ref 20.0–28.0)
O2 SAT: 97 %
TCO2: 25 mmol/L (ref 22–32)
pCO2 arterial: 40 mmHg (ref 32.0–48.0)
pH, Arterial: 7.381 (ref 7.350–7.450)
pO2, Arterial: 97 mmHg (ref 83.0–108.0)

## 2017-12-26 LAB — GLUCOSE, CAPILLARY
GLUCOSE-CAPILLARY: 115 mg/dL — AB (ref 70–99)
GLUCOSE-CAPILLARY: 92 mg/dL (ref 70–99)
GLUCOSE-CAPILLARY: 95 mg/dL (ref 70–99)
Glucose-Capillary: 121 mg/dL — ABNORMAL HIGH (ref 70–99)
Glucose-Capillary: 122 mg/dL — ABNORMAL HIGH (ref 70–99)
Glucose-Capillary: 83 mg/dL (ref 70–99)

## 2017-12-26 LAB — BASIC METABOLIC PANEL
Anion gap: 15 (ref 5–15)
BUN: 62 mg/dL — AB (ref 8–23)
CO2: 23 mmol/L (ref 22–32)
CREATININE: 3.42 mg/dL — AB (ref 0.44–1.00)
Calcium: 9.5 mg/dL (ref 8.9–10.3)
Chloride: 105 mmol/L (ref 98–111)
GFR calc Af Amer: 13 mL/min — ABNORMAL LOW (ref >60)
GFR calc non Af Amer: 11 mL/min — ABNORMAL LOW (ref >60)
GLUCOSE: 137 mg/dL — AB (ref 70–99)
Potassium: 3.4 mmol/L — ABNORMAL LOW (ref 3.5–5.1)
SODIUM: 143 mmol/L (ref 135–145)

## 2017-12-26 LAB — PHOSPHORUS
PHOSPHORUS: 3.9 mg/dL (ref 2.5–4.6)
PHOSPHORUS: 3.3 mg/dL (ref 2.5–4.6)

## 2017-12-26 LAB — HEPARIN LEVEL (UNFRACTIONATED)
HEPARIN UNFRACTIONATED: 0.41 [IU]/mL (ref 0.30–0.70)
HEPARIN UNFRACTIONATED: 0.25 [IU]/mL — AB (ref 0.30–0.70)

## 2017-12-26 LAB — MAGNESIUM: MAGNESIUM: 2.3 mg/dL (ref 1.7–2.4)

## 2017-12-26 LAB — STREP PNEUMONIAE URINARY ANTIGEN: Strep Pneumo Urinary Antigen: NEGATIVE

## 2017-12-26 LAB — VANCOMYCIN, RANDOM: VANCOMYCIN RM: 8

## 2017-12-26 MED ORDER — FENTANYL CITRATE (PF) 100 MCG/2ML IJ SOLN
25.0000 ug | INTRAMUSCULAR | Status: DC | PRN
  Administered 2017-12-26: 25 ug via INTRAVENOUS

## 2017-12-26 MED ORDER — FUROSEMIDE 10 MG/ML IJ SOLN: 40.0000 mg | Freq: Four times a day (QID) | INTRAMUSCULAR | Status: DC

## 2017-12-26 MED ORDER — DOCUSATE SODIUM 50 MG/5ML PO LIQD
100.0000 mg | Freq: Every day | ORAL | Status: DC
  Administered 2017-12-26: 100 mg
  Filled 2017-12-26: qty 10

## 2017-12-26 MED ORDER — FUROSEMIDE 10 MG/ML IJ SOLN
40.0000 mg | Freq: Two times a day (BID) | INTRAMUSCULAR | Status: DC
  Administered 2017-12-27: 40 mg via INTRAVENOUS
  Filled 2017-12-26: qty 4

## 2017-12-26 MED ORDER — ORAL CARE MOUTH RINSE
15.0000 mL | Freq: Two times a day (BID) | OROMUCOSAL | Status: DC
  Administered 2017-12-26 – 2018-01-02 (×14): 15 mL via OROMUCOSAL

## 2017-12-26 MED ORDER — FUROSEMIDE 10 MG/ML IJ SOLN
120.0000 mg | Freq: Once | INTRAVENOUS | Status: DC | PRN
  Filled 2017-12-26: qty 12

## 2017-12-26 MED ORDER — FUROSEMIDE 80 MG PO TABS: 120.0000 mg | ORAL_TABLET | Freq: Every day | ORAL | Status: DC | PRN

## 2017-12-26 MED ORDER — SODIUM CHLORIDE 0.9 % IV SOLN: Freq: Once | INTRAVENOUS | Status: DC

## 2017-12-26 MED ORDER — CHLORHEXIDINE GLUCONATE CLOTH 2 % EX PADS
6.0000 | MEDICATED_PAD | Freq: Every day | CUTANEOUS | Status: AC
  Administered 2017-12-27 – 2017-12-31 (×5): 6 via TOPICAL

## 2017-12-26 MED ORDER — HEPARIN BOLUS VIA INFUSION
900.0000 [IU] | Freq: Once | INTRAVENOUS | Status: AC
  Administered 2017-12-26: 900 [IU] via INTRAVENOUS
  Filled 2017-12-26: qty 900

## 2017-12-26 MED ORDER — METOPROLOL TARTRATE 5 MG/5ML IV SOLN
INTRAVENOUS | Status: AC
  Filled 2017-12-26: qty 5

## 2017-12-26 MED ORDER — FENTANYL CITRATE (PF) 100 MCG/2ML IJ SOLN
25.0000 ug | INTRAMUSCULAR | Status: DC | PRN
  Administered 2017-12-27: 25 ug via INTRAVENOUS
  Filled 2017-12-26: qty 2

## 2017-12-26 MED ORDER — METOPROLOL TARTRATE 5 MG/5ML IV SOLN
2.5000 mg | Freq: Once | INTRAVENOUS | Status: AC
  Administered 2017-12-26: 2.5 mg via INTRAVENOUS

## 2017-12-26 MED ORDER — MUPIROCIN 2 % EX OINT
1.0000 "application " | TOPICAL_OINTMENT | Freq: Two times a day (BID) | CUTANEOUS | Status: AC
  Administered 2017-12-26 – 2017-12-30 (×10): 1 via NASAL
  Filled 2017-12-26 (×4): qty 22

## 2017-12-26 MED ORDER — ACETAMINOPHEN 325 MG PO TABS
650.0000 mg | ORAL_TABLET | Freq: Four times a day (QID) | ORAL | Status: DC | PRN
  Filled 2017-12-26: qty 2

## 2017-12-26 MED ORDER — FUROSEMIDE 10 MG/ML IJ SOLN
120.0000 mg | Freq: Once | INTRAVENOUS | Status: AC | PRN
  Administered 2017-12-26: 120 mg via INTRAVENOUS
  Filled 2017-12-26 (×2): qty 12

## 2017-12-26 MED ORDER — MAGNESIUM HYDROXIDE 400 MG/5ML PO SUSP
5.0000 mL | Freq: Once | ORAL | Status: AC
  Administered 2017-12-26: 5 mL via ORAL
  Filled 2017-12-26: qty 30

## 2017-12-26 MED ORDER — ONDANSETRON HCL 4 MG/2ML IJ SOLN
INTRAMUSCULAR | Status: AC
  Administered 2017-12-26: 4 mg
  Filled 2017-12-26: qty 2

## 2017-12-26 MED ORDER — ALBUTEROL SULFATE (2.5 MG/3ML) 0.083% IN NEBU
2.5000 mg | INHALATION_SOLUTION | RESPIRATORY_TRACT | Status: DC | PRN
  Administered 2017-12-26 – 2017-12-28 (×3): 2.5 mg via RESPIRATORY_TRACT
  Filled 2017-12-26 (×3): qty 3

## 2017-12-26 MED ORDER — FUROSEMIDE 10 MG/ML IJ SOLN: 80.0000 mg | Freq: Once | INTRAMUSCULAR | Status: DC | PRN

## 2017-12-26 MED ORDER — HYDRALAZINE HCL 20 MG/ML IJ SOLN
10.0000 mg | INTRAMUSCULAR | Status: DC | PRN
  Administered 2017-12-26: 10 mg via INTRAVENOUS
  Filled 2017-12-26: qty 1

## 2017-12-26 MED ORDER — ONDANSETRON HCL 4 MG/2ML IJ SOLN
4.0000 mg | Freq: Four times a day (QID) | INTRAMUSCULAR | Status: DC | PRN
  Administered 2017-12-28 – 2018-01-02 (×3): 4 mg via INTRAVENOUS
  Filled 2017-12-26 (×3): qty 2

## 2017-12-26 MED ORDER — METOPROLOL TARTRATE 5 MG/5ML IV SOLN
2.5000 mg | Freq: Four times a day (QID) | INTRAVENOUS | Status: DC | PRN
  Administered 2017-12-26: 2.5 mg via INTRAVENOUS
  Filled 2017-12-26: qty 5

## 2017-12-26 MED ORDER — FUROSEMIDE 10 MG/ML IJ SOLN: 80.0000 mg | Freq: Two times a day (BID) | INTRAMUSCULAR | Status: DC

## 2017-12-26 MED ORDER — SODIUM CHLORIDE 0.9 % IV SOLN
INTRAVENOUS | Status: DC
  Administered 2017-12-26: 07:00:00 via INTRAVENOUS

## 2017-12-26 MED ORDER — POTASSIUM CHLORIDE 10 MEQ/100ML IV SOLN
10.0000 meq | INTRAVENOUS | Status: AC
  Administered 2017-12-26 (×2): 10 meq via INTRAVENOUS
  Filled 2017-12-26 (×2): qty 100

## 2017-12-26 NOTE — Progress Notes (Addendum)
eLink Physician-Brief Progress Note Patient Name: [name] DOB: [birth year] MRN: [id number]   Date of Service  12/26/2017  HPI/Events of Note  Narrow complex tachycardia with a stable blood pressure. Difficult blood draw and PICC not yet placed.  eICU Interventions  Obtain 12 lead EKG, Lopressor 2.5 mg iv x 1 May stick patient's foot once for labs pending PICC placement.        [name] 12/26/2017, 5:45 AM

## 2017-12-26 NOTE — Consult Note (Signed)
Cardiology Consultation:   Patient ID: PRUDY CANDY MRN: [id number]; DOB: 1932-11-25  Admit date: 12/24/2017 Date of Consult: 12/26/2017  Primary Care Provider: Jani Gravel, MD Primary Cardiologist: Sherren Mocha, MD  Primary Electrophysiologist:  Particia Lather   Patient Profile:   [name] is a 84 y.o. female with a hx of NICM, chronic systolic and diastolic heart failure 9EF 25-30% with grade 2 diastolic dysfunction), morbid obesity, severe AS s/p TAVR 2014, paroxsymal atrial fibrillation on Coumadin, HTN, CKD stage IV, COPD with chronic respiratory failure on home O2, OSA, bradycardia s/p pacemaker who is being seen today for the evaluation of CHF at the request of Rush Farmer, MD.  History of Present Illness:   [name] 84 year-old female with extensive past medical history, she was admitted on 11/12/17, 12/05/17 for acute respiratory distress and acute on chronic CHF that responded to Lasix.    She returns to [facility] emergency department on 12/24/2017 again with acute shortness of breath, O2 sats 70% on 5L , she was placed on BiPAP did not tolerate it developed frothy blood-tinged sputum and when asked if she wanted to be intubated was intubated by the emergency department physician. She was extubated this morning. She has Been coughing and wheezing ever since she got extubated.  Past Medical History:  Diagnosis Date  . Acute on chronic renal failure [facility])    sees Dr Posey Pronto   . Anemia    Acute blood loss  . Aortic regurgitation   . Aortic stenosis 10/13/2012   Low EF, low gradient with severe aortic stenosis confirmed by dobutamine stress echocardiogram s/p TAVR 12/2012  . Asthma   . Atrial fibrillation (HCC)    tachy-brady syndrome with <1% recurrent PAF since pacemaker placement  . Cataracts, bilateral   . Chronic combined systolic and diastolic CHF (congestive heart failure) ([place])   . Chronic lower back pain   . CKD (chronic kidney disease)   . Coronary artery  disease involving native coronary artery of native heart   . Dementia    Without behavioral disturbance  . Dysrhythmia   . Fibromyalgia   . Gastroesophageal reflux disease   . H/O dizziness   . H/O urinary frequency   . H/O: stroke   . Hard of hearing   . Headache    hx of migraines   . Heart murmur   . History of blood transfusion   . History of bronchitis   . History of kidney stones   . History of urinary tract infection   . HLD (hyperlipidemia)   . HTN (hypertension)   . Hypothyroidism   . Neuropathy   . Nonischemic cardiomyopathy ([place])   . On home oxygen therapy    patient uses at nite- 2L- has not used in > 6 months per patient  . Osteoarthritis   . Osteoporosis   . Pneumonia    hx of x 3   . PONV (postoperative nausea and vomiting)   . Presence of permanent cardiac pacemaker   . Pulmonary embolism (HCC)    HISTORY OF, the pt. had a recurrent bilateral pulmonary emboli in 2005, on warfarin therapy and at which time she under went implantation of IVC filter  . Repeated falls   . Rupture of right patellar tendon   . Sciatica   . Shortness of breath dyspnea    with exertion   . Sleep apnea    uses oxygen at night and PRN- not used since > 6 months /  DOES NOT USE  C-PAP  . Spinal stenosis   . Stroke ([place])   . Symptomatic bradycardia   . Symptomatic bradycardia 2012   s/p Medtronic PPM  . Syncope   . Urinary incontinence   . Urinary urgency     Past Surgical History:  Procedure Laterality Date  . ABDOMINAL HYSTERECTOMY    . APPENDECTOMY    . BACK SURGERY    . BUNIONECTOMY    . CARDIAC CATHETERIZATION    . CATARACT EXTRACTION    . CENTRAL VENOUS CATHETER INSERTION Left 12/21/2012   Procedure: INSERTION CENTRAL LINE ADULT;  Surgeon: Sherren Mocha, MD;  Location: [facility];  Service: Open Heart Surgery;  Laterality: Left;  . ELBOW SURGERY     bilat   . INTRAOPERATIVE TRANSESOPHAGEAL ECHOCARDIOGRAM N/A 12/21/2012   Procedure: INTRAOPERATIVE TRANSESOPHAGEAL  ECHOCARDIOGRAM;  Surgeon: Sherren Mocha, MD;  Location: [facility] OR;  Service: Open Heart Surgery;  Laterality: N/A;  . KNEE SURGERY Left   . LEFT AND RIGHT HEART CATHETERIZATION WITH CORONARY ANGIOGRAM N/A 10/18/2012   Procedure: LEFT AND RIGHT HEART CATHETERIZATION WITH CORONARY ANGIOGRAM;  Surgeon: Sherren Mocha, MD;  Location: [facility] CATH LAB;  Service: Cardiovascular;  Laterality: N/A;  . NASAL SEPTUM SURGERY    . NOSE SURGERY     X 2  . ORIF PATELLA Right 03/08/2015   Procedure:  OPEN REDUCTION INTERNAL FIXATION RIGHT  PATELLA TENDON AVULSION;  Surgeon: Paralee Cancel, MD;  Location: [facility] ORS;  Service: Orthopedics;  Laterality: Right;  . PACEMAKER INSERTION     Medtronic  . PATELLAR TENDON REPAIR Right 06/25/2015   Procedure: RIGHT PATELLA TENDON REVISION/REPAIR;  Surgeon: Paralee Cancel, MD;  Location: [facility] ORS;  Service: Orthopedics;  Laterality: Right;  . SHOULDER SURGERY     bilat   . THYROIDECTOMY, PARTIAL    . TONSILLECTOMY    . TOTAL KNEE ARTHROPLASTY Right 12/04/2014   Procedure: RIGHT TOTAL  KNEE ARTHROPLASTY;  Surgeon: Paralee Cancel, MD;  Location: [facility] ORS;  Service: Orthopedics;  Laterality: Right;  . TRANSCATHETER AORTIC VALVE REPLACEMENT, TRANSFEMORAL  12/21/2012   a. 75m Edwards Sapien XT transcatheter heart valve placed via open left transfemoral approach b. Intra-op TEE: well-seated bioprosthetic aortic valve with mean gradient 2 mmHg, trivial AI, mild MR, EF 30-35%  . TRANSCATHETER AORTIC VALVE REPLACEMENT, TRANSFEMORAL N/A 12/21/2012   Procedure: TRANSCATHETER AORTIC VALVE REPLACEMENT, TRANSFEMORAL;  Surgeon: MSherren Mocha MD;  Location: [facility]  Service: Open Heart Surgery;  Laterality: N/A;     Home Medications:  Prior to Admission medications   Medication Sig Start Date End Date Taking? Authorizing Provider  allopurinol (ZYLOPRIM) 100 MG tablet Take 100 mg by mouth daily.   Yes [provider]  aluminum-magnesium hydroxide-simethicone (MAALOX) 200-200-20 MG/5ML SUSP Take 30  mLs by mouth 3 (three) times daily as needed (for indegestion).   Yes [provider]  amiodarone (PACERONE) 100 MG tablet Take 100 mg by mouth daily.   Yes [provider]  atorvastatin (LIPITOR) 10 MG tablet Take 10 mg by mouth daily at 6 PM.    Yes [provider]  calcitRIOL (ROCALTROL) 0.25 MCG capsule Take 0.25 mcg by mouth daily.    Yes [provider]  cholecalciferol (VITAMIN D) 1000 units tablet Take 2,000 Units by mouth at bedtime.   Yes [provider]  cycloSPORINE (RESTASIS) 0.05 % ophthalmic emulsion Place 1 drop into both eyes 2 (two) times daily.    Yes [provider]  docusate sodium (COLACE) 100 MG capsule  Take 1 capsule (100 mg total) by mouth 2 (two) times daily. 12/08/17  Yes Thurnell Lose, MD  donepezil (ARICEPT) 10 MG tablet Take 10 mg by mouth at bedtime.    Yes [provider]  DULoxetine (CYMBALTA) 60 MG capsule Take 60 mg by mouth daily at 6 PM.    Yes [provider]  ferrous sulfate 325 (65 FE) MG tablet Take 325 mg by mouth daily.    Yes [provider]  gabapentin (NEURONTIN) 100 MG capsule Take 200 mg by mouth 2 (two) times daily.   Yes [provider]  ipratropium-albuterol (DUONEB) 0.5-2.5 (3) MG/3ML SOLN Take 3 mLs by nebulization every 6 (six) hours as needed (wheezing/shortness of breath).   Yes [provider]  ipratropium-albuterol (DUONEB) 0.5-2.5 (3) MG/3ML SOLN Take 3 mLs by nebulization 2 (two) times daily.   Yes [provider]  isosorbide mononitrate (IMDUR) 30 MG 24 hr tablet Take 1 tablet (30 mg total) by mouth daily. Patient taking differently: Take 60 mg by mouth daily.  08/05/16 12/24/25 Yes Bhagat, Bhavinkumar, PA  levothyroxine (SYNTHROID, LEVOTHROID) 100 MCG tablet Take 1 tablet (100 mcg total) by mouth daily before breakfast. 11/12/17  Yes Dana Allan I, MD  lidocaine (LIDODERM) 5 % Place 1 patch onto the skin daily. Apply to lower  back as needed for pain.   Yes [provider]  linaclotide (LINZESS) 290 MCG CAPS capsule Take 290 mcg by mouth daily.    Yes [provider]  Menthol, Topical Analgesic, (BIOFREEZE) 4 % GEL Apply 1 application topically 2 (two) times daily.   Yes [provider]  metolazone (ZAROXOLYN) 2.5 MG tablet 1 pill every Wednesday as needed once a week for weight gain over 2.5 pounds, edema or shortness of breath. Patient taking differently: Take 2.5 mg by mouth See admin instructions. 1 pill every Wednesday as needed once a week for weight gain over 2.5 pounds, edema or shortness of breath. 12/08/17  Yes Thurnell Lose, MD  metoprolol tartrate (LOPRESSOR) 25 MG tablet Take 0.5 tablets (12.5 mg total) by mouth 2 (two) times daily. 12/08/17  Yes Thurnell Lose, MD  morphine 20 MG/5ML solution Take 10 mg by mouth every 2 (two) hours as needed for pain.   Yes [provider]  Multiple Vitamin (MULTIVITAMIN WITH MINERALS) TABS tablet Take 1 tablet by mouth daily.   Yes [provider]  mupirocin ointment (BACTROBAN) 2 % Place 1 application into the nose 2 (two) times daily. 11/12/17  Yes Bonnell Public, MD  nitroGLYCERIN (NITROSTAT) 0.4 MG SL tablet Place 0.4 mg under the tongue every 5 (five) minutes as needed for chest pain (MAX 3 TABLETS).    Yes [provider]  ondansetron (ZOFRAN) 4 MG tablet Take 4 mg by mouth every 8 (eight) hours as needed for nausea or vomiting.   Yes [provider]  polyethylene glycol (MIRALAX / GLYCOLAX) packet Take 17 g by mouth daily.   Yes [provider]  torsemide (DEMADEX) 20 MG tablet Take 40 mg by mouth daily.    Yes [provider]  warfarin (COUMADIN) 3 MG tablet Take 3 mg by mouth daily.   Yes [provider]  bisacodyl (DULCOLAX) 10 MG suppository Place 1 suppository (10 mg total) rectally daily as needed for moderate constipation. Patient not taking: Reported on 12/24/2017  12/08/17   Thurnell Lose, MD  HYDROcodone-acetaminophen (NORCO/VICODIN) 5-325 MG tablet Take 1 tablet by mouth every 4 (four) hours  as needed for severe pain. Patient not taking: Reported on 12/24/2017 11/12/17   Dana Allan I, MD    Inpatient Medications: Scheduled Meds: . chlorhexidine gluconate (MEDLINE KIT)  15 mL Mouth Rinse BID  . docusate  100 mg Per Tube Daily  . furosemide  80 mg Intravenous BID  . insulin aspart  0-9 Units Subcutaneous Q4H  . ipratropium-albuterol  3 mL Nebulization Q6H  . levothyroxine  100 mcg Per Tube QAC breakfast  . mouth rinse  15 mL Mouth Rinse 10 times per day   Continuous Infusions: . sodium chloride    . sodium chloride 150 mL/hr at 12/26/17 0658  . ceFEPime (MAXIPIME) IV Stopped (12/26/17 4315)  . feeding supplement (VITAL HIGH PROTEIN) Stopped (12/26/17 0959)  . heparin 800 Units/hr (12/26/17 0600)  . potassium chloride 10 mEq (12/26/17 1102)  . propofol (DIPRIVAN) infusion Stopped (12/26/17 0913)   PRN Meds: sodium chloride, fentaNYL (SUBLIMAZE) injection, fentaNYL (SUBLIMAZE) injection, hydrALAZINE, metoprolol tartrate, midazolam, midazolam  Allergies:    Allergies  Allergen Reactions  . Nubain [Nalbuphine Hcl] Hives    Went into cardiac arrest   . Codeine Hives and Nausea Only    Has tolerated Oxycodone  . Darvocet [Propoxyphene N-Acetaminophen] Nausea Only    Social History:   Social History   Socioeconomic History  . Marital status: Divorced    Spouse name: Not on file  . Number of children: Not on file  . Years of education: Not on file  . Highest education level: Not on file  Occupational History  . Not on file  Social Needs  . Financial resource strain: Not on file  . Food insecurity:    Worry: Not on file    Inability: Not on file  . Transportation needs:    Medical: Not on file    Non-medical: Not on file  Tobacco Use  . Smoking status: Never Smoker  . Smokeless tobacco: Never Used  Substance and Sexual  Activity  . Alcohol use: No  . Drug use: No  . Sexual activity: Not Currently  Lifestyle  . Physical activity:    Days per week: Not on file    Minutes per session: Not on file  . Stress: Not on file  Relationships  . Social connections:    Talks on phone: Not on file    Gets together: Not on file    Attends religious service: Not on file    Active member of club or organization: Not on file    Attends meetings of clubs or organizations: Not on file    Relationship status: Not on file  . Intimate partner violence:    Fear of current or ex partner: Not on file    Emotionally abused: Not on file    Physically abused: Not on file    Forced sexual activity: Not on file  Other Topics Concern  . Not on file  Social History Narrative  . Not on file    Family History:    Family History  Problem Relation Age of Onset  . Ovarian cancer Mother        Deceased  . Epilepsy Father        Deceased    ROS:  Please see the history of present illness.  All other ROS reviewed and negative.     Physical Exam/Data:   Vitals:   12/26/17 1015 12/26/17 1030 12/26/17 1045 12/26/17 1146  BP:  (!) 157/57  (!) 152/53  Pulse: 94 90 90  97  Resp: (!) 25 (!) 31 (!) 25 (!) 29  Temp:      TempSrc:      SpO2: 100% 100% 100% 98%  Weight:      Height:        Intake/Output Summary (Last 24 hours) at 12/26/2017 1151 Last data filed at 12/26/2017 1100 Gross per 24 hour  Intake 1976.48 ml  Output 2225 ml  Net -248.52 ml   Filed Weights   12/24/17 0530 12/25/17 0500 12/26/17 0433  Weight: 72 kg 73 kg 73.5 kg   Body mass index is 27.81 kg/m.  General:  Well nourished, well developed, in mild respiratory distress, unable to stop coughing HEENT: normal Lymph: no adenopathy Neck: no JVD Endocrine:  No thryomegaly Vascular: No carotid bruits; FA pulses 2+ bilaterally without bruits  Cardiac:  normal S1, S2; RRR; mild diastolic murmur Lungs: Minimal crackles to auscultation bilaterally,  wheezing bilaterally Abd: soft, nontender, no hepatomegaly  Ext: no edema Musculoskeletal:  No deformities, BUE and BLE strength normal and equal Skin: Right lower foot is cold, pulses not palpable bilaterally Neuro:  CNs 2-12 intact, no focal abnormalities noted Psych:  Normal affect   EKG:  The EKG was personally reviewed and demonstrates:  SR, LBBB Telemetry:  Telemetry was personally reviewed and demonstrates:  SR, a-fib from 5 to 8 am  Relevant CV Studies:  TTE: 11/08/2017 Left ventricle: The cavity size was moderately dilated. Wall   thickness was increased in a pattern of mild LVH. Systolic   function was severely reduced. The estimated ejection fraction   was in the range of 25% to 30%. Diffuse hypokinesis, with   akinesis of specific walls below. Features are consistent with a   pseudonormal left ventricular filling pattern, with concomitant   abnormal relaxation and increased filling pressure (grade 2   diastolic dysfunction). - Regional wall motion abnormality: Akinesis of the mid   anteroseptal, basal-mid inferoseptal, and mid inferior   myocardium; hypokinesis of the mid anterior, basal anteroseptal,   apical septal, apical lateral, and apical myocardium. - Aortic valve: A prosthesis was present and functioning normally.   The prosthesis had a normal range of motion. The sewing ring   appeared normal, had no rocking motion, and showed no evidence of   dehiscence.  There was mild regurgitation. Mean gradient (S): 11   mm Hg. Peak gradient (S): 18 mm Hg. Valve area (VTI): 1.91 cm^2.   Valve area (Vmax): 1.73 cm^2. Valve area (Vmean): 1.71 cm^2. - Mitral valve: Mobility was restricted. There was mild   regurgitation directed eccentrically. - Left atrium: The atrium was moderately dilated. - Atrial septum: No defect or patent foramen ovale was identified. - Tricuspid valve: There was mild regurgitation. - Pulmonic valve: There was mild regurgitation. - Pulmonary  arteries: PA peak pressure: 32 mm Hg (S).  Impressions:  - LV function appears worse from prior. TAVR in place, with   gradients slightly elevated from prior (peak 18, mean 11, AVA   1.91). Valvular AI also worse compared to prior  Laboratory Data:  Chemistry Recent Labs  Lab 12/24/17 1023 12/25/17 0853 12/26/17 0623  NA 142 142 143  K 3.2* 3.2* 3.4*  CL 99 107 105  CO2 _0 GLUCOSE 156* 109* 137*  BUN 70* 67* 62*  CREATININE 2.74* 3.45* 3.42*  CALCIUM 9.5 8.8* 9.5  GFRNONAA 15* 11* 11*  GFRAA 17* 13* 13*  ANIONGAP 16* 12 15    Recent Labs  Lab 12/24/17  1023  PROT 5.6*  ALBUMIN 3.0*  AST 33  ALT 16  ALKPHOS 66  BILITOT 1.0   Hematology Recent Labs  Lab 12/24/17 1023 12/25/17 0853 12/26/17 0623  WBC 18.6* 13.1* 14.6*  RBC 3.41* 3.11* 3.57*  HGB 10.7* 9.7* 11.3*  HCT 34.3* 30.2* 34.7*  MCV 100.6* 97.1 97.2  MCH 31.4 31.2 31.7  MCHC 31.2 32.1 32.6  RDW 14.7 15.5 15.4  PLT 335 358 300   Cardiac Enzymes Recent Labs  Lab 12/24/17 1023 12/24/17 1358 12/24/17 2055  TROPONINI 0.17* 0.20* 0.18*   No results for input(s): TROPIPOC in the last 168 hours.  BNP Recent Labs  Lab 12/24/17 1358  BNP 779.1*    DDimer No results for input(s): DDIMER in the last 168 hours.  Radiology/Studies:  Ct Chest Wo Contrast  Result Date: 12/24/2017 CLINICAL DATA:  Status post intubation. Respiratory failure. Elevated white blood cell count. EXAM: CT CHEST WITHOUT CONTRAST TECHNIQUE: Multidetector CT imaging of the chest was performed following the standard protocol without IV contrast. COMPARISON:  11/03/2017 FINDINGS: Cardiovascular: Normal caliber thoracic aorta with atherosclerosis. Prior TAVR. Stable cardiomegaly. Dual lead cardiac pacemaker. Mediastinum/Nodes: Mediastinum is suboptimally evaluated secondary to lack of intravenous contrast. Trachea, esophagus and thyroid gland demonstrate no focal abnormality. Bilateral hilar lymphadenopathy. Nasogastric tube  coursing below the diaphragm within the stomach. Endotracheal tube in satisfactory position. Lungs/Pleura: Patchy airspace disease in the right upper lobe, left upper lobe, right middle lobe and bilateral lower lobes, right worse than left. Bilateral small pleural effusions. Upper Abdomen: No acute upper abdominal abnormality. Musculoskeletal: No aggressive osseous lesion. No acute osseous abnormality. Mild thoracolumbar spondylosis. Partially visualize chondrocalcinosis of right shoulder. Mild osteoarthritis of the right glenohumeral joint. S-shaped scoliosis of the thoracolumbar spine. IMPRESSION: 1. Bilateral multilobar pneumonia. 2. Endotracheal tube in satisfactory position. 3.  Aortic Atherosclerosis (ICD10-I70.0). Electronically Signed   By: Kathreen Devoid   On: 12/24/2017 08:32   Dg Chest Port 1 View  Result Date: 12/26/2017 CLINICAL DATA:  History of ETT EXAM: PORTABLE CHEST 1 VIEW COMPARISON:  December 25, 2017 FINDINGS: The ETT is in good position. The NG tube terminates below today's film. No pneumothorax. Small effusion with underlying opacity in left base is stable. The focal opacity in the medial right base persists but is less prominent the interval. Mild increased interstitial markings raise the possibility of mild edema/pulmonary venous congestion. No other change. IMPRESSION: 1. Support apparatus as above. 2. Improving infiltrate in medial right lung base. 3. Small left effusion with underlying opacity. 4. Suggested pulmonary venous congestion versus mild edema. Electronically Signed   By: Dorise Bullion III M.D   On: 12/26/2017 07:26   Dg Chest Port 1 View  Result Date: 12/25/2017 CLINICAL DATA:  Respiratory failure EXAM: PORTABLE CHEST 1 VIEW COMPARISON:  Chest x-rays dated 12/24/2017 and 12/18/2017. FINDINGS: Today's study is hypoinspiratory. Increasingly dense opacity at the RIGHT lung base, possibly accentuated by the low lung volumes on today's study. Persistent opacity at the LEFT lung  base. No pneumothorax seen. Endotracheal tube is well positioned with tip approximately 2 cm above the carina. Enteric tube passes below the diaphragm. Dual lead pacemaker/ICD apparatus appears stable in position. Valvular hardware appears stable in position. Heart size and mediastinal contours appear stable. IMPRESSION: 1. Hypoinspiratory exam. Increasingly dense opacity within the RIGHT lower lung, compatible with the pneumonia at described on chest CT of 12/24/2017, possibly worsened but more likely accentuated by the low lung volumes on today's study. 2. Stable opacity at  the LEFT lung base, pneumonia versus atelectasis, as also demonstrated on yesterday's chest CT. 3. Endotracheal tube appears well positioned with tip just above the level of the carina. Electronically Signed   By: Franki Cabot M.D.   On: 12/25/2017 09:19   Dg Chest Port 1 View  Result Date: 12/24/2017 CLINICAL DATA:  Endotracheal tube placement. OG present. History of CHF, coronary artery disease, hypertension, pacemaker. EXAM: PORTABLE CHEST 1 VIEW COMPARISON:  Chest x-ray from earlier same day. Chest x-ray dated 12/18/2017. FINDINGS: Endotracheal tube appears well positioned with tip approximately 3 cm above the carina. Enteric tube passes below the diaphragm. Pacemaker leads appear grossly stable in position. Heart size and mediastinal contours are stable. Atherosclerotic changes noted at the aortic arch. Aortic valve hardware appears stable in position. Worsening pulmonary edema pattern bilaterally. Probable small bilateral pleural effusions. Probable atelectasis at the LEFT lung base. No pneumothorax seen. IMPRESSION: 1. Endotracheal tube is well positioned with tip approximately 3 cm above the carina. 2. OG tube passes below the diaphragm. 3. Worsening pulmonary edema. 4. Probable small bilateral pleural effusions and LEFT basilar atelectasis. 5. Stable mild cardiomegaly. Electronically Signed   By: Franki Cabot M.D.   On: 12/24/2017  07:55   Dg Chest Portable 1 View  Result Date: 12/24/2017 CLINICAL DATA:  Shortness of breath and chest pain today. EXAM: PORTABLE CHEST 1 VIEW COMPARISON:  12/18/2017 FINDINGS: Cardiac pacemaker. Coronary valve prosthesis. Mild cardiac enlargement. Bilateral perihilar infiltration and airspace disease, more prominent on the right. This may represent edema or multifocal pneumonia. Appearances are more prominent than on the previous study. Probable small left pleural effusion. No pneumothorax. Calcification of the aorta. IMPRESSION: Cardiac enlargement with bilateral perihilar infiltration and small left pleural effusion. Progression since previous study. Electronically Signed   By: Lucienne Capers M.D.   On: 12/24/2017 06:05   [place] Ekg Site Rite  Result Date: 12/25/2017 If Site Rite image not attached, placement could not be confirmed due to current cardiac rhythm.   Assessment and Plan:   1. Acute on chronic systolic heart failure EF 25% -Weight down to 72 kg = 161 lbs, on admission in July 167 lbs, - Crea up to 3.45 from baseline 2.0-2.5, BUN 67, she is only mildly fluid overloaded on physical exam and CXR improving, I would hold lasix today and decrease to 40 mg iv BID starting tomorrow.  2. Acute respiratory failure - combination of CHF and chronic COPD O2 de, saturating 97%, but wheezing, I will give breathing treatment now  3.TAVR -Mild aortic regurgitation. Slightly worse hemodynamic gradients.  Stable issue.  Seems to be working properly.  4. Paroxysmal atrial fibrillation -Carvedilol amiodarone Coumadin held.An episode of a-fib with RVR this am, now in SR, No changes made, stable, I would consider cardizem drip if a-fib with RVR recurs   5. CKD 4 -Worsening creatinine after diuresis. diuretics as above.  6. Troponin elevation  - demand ischemia, no ischemic workup  We will follow.  For questions or updates, please contact [place] Please consult [email or web address]  for contact info under   Signed, Ena Dawley, MD  12/26/2017 11:51 AM

## 2017-12-26 NOTE — Progress Notes (Signed)
ANTICOAGULATION + ANTIMICROBIAL CONSULT NOTE  Pharmacy Consult for heparin Indication: atrial fibrillation, TAVR   Patient Measurements: Height: 5\' 4"  (162.6 cm) Weight: 162 lb 0.6 oz (73.5 kg) IBW/kg (Calculated) : 54.7 Heparin Dosing Weight: 61.4  Vital Signs: Temp: 99 F (37.2 C) (09/07 0817) Temp Source: Axillary (09/07 0817) BP: 139/45 (09/07 0900) Pulse Rate: 96 (09/07 0915)  Labs: Recent Labs    12/24/17 1023 12/24/17 1358 12/24/17 2055 12/25/17 0853 12/25/17 2323 12/26/17 0623 12/26/17 0815  HGB 10.7*  --   --  9.7*  --  11.3*  --   HCT 34.3*  --   --  30.2*  --  34.7*  --   PLT 335  --   --  358  --  300  --   APTT 28  --   --   --   --   --   --   LABPROT 14.7  --   --   --   --   --   --   INR 1.16  --   --   --   --   --   --   HEPARINUNFRC  --   --   --   --  0.76*  --  0.41  CREATININE 2.74*  --   --  3.45*  --  3.42*  --   TROPONINI 0.17* 0.20* 0.18*  --   --   --   --      Assessment: 84 yo female with pAF (on warfarin PTA) and TAVR (2014) presenting with respiratory distress and CHF exacerbation. Pharmacy has been consulted to start a heparin drip with subtherapeutic INR of 1.1 on admission. Heparin level is therapeutic this morning. Patient has been a difficult stick, attempting to place PICC.   Today is day #3 of antibiotics. All cultures are negative to date. Strep pneumo/legionella antigen were never drawn. Vancomycin random level returned at 8 (subtherapeutic) this morning. We will discontinue vancomycin and continue on azithromycin and cefepime until we have more culture data.    Goal of Therapy:  Heparin level 0.3-0.7 units/ml Monitor platelets by anticoagulation protocol: Yes    Plan:  -Continue heparin at 800 units/hr  -Daily HL, CBC  -Check confirmatory level -Continue azithromycin and cefepime  -Monitor renal function and cultures   Baldemar Friday 12/26/2017 10:09 AM

## 2017-12-26 NOTE — Progress Notes (Signed)
eLink Physician-Brief Progress Note Patient Name: AVALEY STAHMER DOB: Mar 17, 1933 MRN: [id number]   Date of Service  12/26/2017  HPI/Events of Note  Persistent narrow complex tachycardia  eICU Interventions  Normal Saline 150 ml iv fluid bolus as fluid challenge to r/o volume depletion as etiology of tachycardia        Migdalia Dk 12/26/2017, 6:56 AM

## 2017-12-26 NOTE — Progress Notes (Signed)
Patient needs more access to give antibiotics, sedation, ect. PICC line order put in on nightshift to be done this morning. IV team refusing placement until renal signs off on line due to Creatinine over limit of 2. [place] notified of IV team's concern, stated he would call Dr. Lowell Guitar (Renal) who is seeing the patient today with resident. IV team called and said they had gotten a verbal order from Renal to go ahead with placement. IV team called back again to notify that they were refusing to place line due to dual pacemaker in the right arm. Cardiology consulted and approved line, IV team called back and on the way. Dr. Lowell Guitar (Renal) came to see the patient and stated that he did not approve the line to be placed and is refusing the PICC. Patient still needs access due to multiple antibiotics and extremely hard stick. Will let CCM know that we need a CVC placed. Will continue to monitor closely. Huntley Estelle E, RN 12/26/2017 10:40 AM

## 2017-12-26 NOTE — Progress Notes (Signed)
ANTICOAGULATION + ANTIMICROBIAL CONSULT NOTE  Pharmacy Consult for heparin Indication: atrial fibrillation, TAVR   Patient Measurements: Height: 5\' 4"  (162.6 cm) Weight: 162 lb 0.6 oz (73.5 kg) IBW/kg (Calculated) : 54.7 Heparin Dosing Weight: 61.4  Vital Signs: Temp: 99.6 F (37.6 C) (09/07 1559) Temp Source: Oral (09/07 1559) BP: 145/46 (09/07 1630) Pulse Rate: 90 (09/07 1715)  Labs: Recent Labs    12/24/17 1023 12/24/17 1358 12/24/17 2055 12/25/17 0853 12/25/17 2323 12/26/17 0623 12/26/17 0815 12/26/17 1614  HGB 10.7*  --   --  9.7*  --  11.3*  --   --   HCT 34.3*  --   --  30.2*  --  34.7*  --   --   PLT 335  --   --  358  --  300  --   --   APTT 28  --   --   --   --   --   --   --   LABPROT 14.7  --   --   --   --   --   --   --   INR 1.16  --   --   --   --   --   --   --   HEPARINUNFRC  --   --   --   --  0.76*  --  0.41 0.25*  CREATININE 2.74*  --   --  3.45*  --  3.42*  --   --   TROPONINI 0.17* 0.20* 0.18*  --   --   --   --   --      Assessment: 84 yo female with pAF (on warfarin PTA) and TAVR (2014) presenting with respiratory distress and CHF exacerbation. Pharmacy has been consulted to start a heparin drip with subtherapeutic INR of 1.1 on admission. Heparin level this afternoon was 0.25 which is low.    Goal of Therapy:  Heparin level 0.3-0.7 units/ml Monitor platelets by anticoagulation protocol: Yes    Plan:  Heparin bolus 900 units IV x 1 -Increase heparin to 950 units/hr  -Daily HL, CBC  Check heparin level in 8 hours   [name] A. Jeanella Craze, PharmD, BCPS Clinical Pharmacist [place] Pager: [phone] Please utilize Amion for appropriate phone number to reach the unit pharmacist [facility])   12/26/2017 5:26 PM

## 2017-12-26 NOTE — Progress Notes (Signed)
[name]  ZOX:[id number] DOB: April 03, 1933 DOA: 12/24/2017 PCP: Pearson Grippe, MD    LOS: 2 days   Reason for Consult / Chief Complaint:  Acute on chronic respiratory failure  Consulting MD and date:    HPI/Summary of hospital stay:  84 year old female with extensive past medical history that is includes but not limited to O2 dependent COPD, morbid obesity, congestive heart failure with a EF of 20%, aortic stenosis severe, paroxysmal atrial fibrillation on Coumadin, dementia, chronic kidney disease with a base creatinine of 2.2, history of pulmonary embolism, who was recently discharged from [facility] on 12/07/2017 for similar episode of acute shortness of breath responding to Lasix. She returns to [facility] emergency department on 12/24/2017 again with acute shortness of breath along with thick sputum she was placed on BiPAP did not tolerate it developed frothy blood-tinged sputum and when asked if she wanted to be intubated was intubated by the emergency department physician. Pulmonary critical care was called to admit.  Subjective:  No events overnight, no new complaints  Objective   Blood pressure (!) 139/45, pulse 96, temperature 99 F (37.2 C), temperature source Axillary, resp. rate 20, height 5\' 4"  (1.626 m), weight 73.5 kg, SpO2 100 %.    Vent Mode: PSV;CPAP FiO2 (%):  [30 %-40 %] 40 % Set Rate:  [20 bmp] 20 bmp Vt Set:  [440 mL] 440 mL PEEP:  [5 cmH20] 5 cmH20 Pressure Support:  [5 cmH20-10 cmH20] 10 cmH20 Plateau Pressure:  [16 cmH20-18 cmH20] 16 cmH20   Intake/Output Summary (Last 24 hours) at 12/26/2017 0940 Last data filed at 12/26/2017 0900 Gross per 24 hour  Intake 1772.59 ml  Output 1265 ml  Net 507.59 ml   Filed Weights   12/24/17 0530 12/25/17 0500 12/26/17 0433  Weight: 72 kg 73 kg 73.5 kg   Examination: General: Chronically ill appearing female, NAD HENT: [place]/AT, PERRL, EOM-I and MMM Lungs: CTA bilaterally Cardiovascular: RRR, Nl S1/S2 and  -M/R/G Abdomen: Obese, NT, ND and +BS Extremities: 1+ edema Neuro: Alert and interactive, moving all ext to command GU: Foley catheter  Consults: date of consult/date signed off & final recs:  Nephrology 9/6>>>  Procedures: 12/24/2017 intubation per emergency department physician>>  Significant Diagnostic Tests: 12/24/2017 CT of the chest pending>>  Micro Data: 12/24/2017 sputum>> 12/24/2017 blood>> 12/24/2017 urine>>  Antimicrobials:  12/24/2017 vancomycin>>9/7 12/24/2017 Zithromax>>9/9 12/24/2017 cefepime>>  Resolved Hospital Problem list    Assessment & Plan:  Vent dependent respiratory failure secondary to acute pulmonary edema suspected overload in setting congestive heart failure with a EF of 20%.  Chronic obstructive pulmonary disease O2 dependent at 2 L daily.  Noted to have hemoptysis prior to intubation Suspected pneumonia bilateral - Extubate - Titrate O2 for sat of 88-92% - Bronchodilators - Continue heparin drip - Antimicrobial therapy as above - Panculture f/u - Flutter valve - IS  Congestive heart failure with EF of 20% followed by lumbar cardiology History of paroxysmal atrial fibrillation and pulmonary embolism on chronic Coumadin therapy Dual-chamber pacemaker in place Severe aortic stenosis Status post TAVR - Lasix 40 mg IV q6 x3 doses - Potassium 20 meq IV x1 - Controlled ventricular rate as needed  Chronic kidney disease with baseline creatinine of 2.2 followed by Dr. Allena Katz - Lasix 40 mg IV q6 x3 doses - Nephrology consult appreciated - Replace electrolytes as indicated  Diabetes mellitus - ISS - CBGs  Anion gap acidosis ,elevated lactic acid which may be a component of sepsis  versus low blood flow state. - Trend lactic acid - Lasix 40 mg IV q6 x3 doses - KVO IVF  Hypothyroidism - TSH - Thyroxin to change to 100 mcg PO daily  Known dementia without behavioral disturbance - D/C propofol for extubation - D/C fentanyl for extubation  Nausea and  vomiting - Gastric tube to low intermittent suction - Antiemetic as needed - Nutrition for TF  GOC: discussion with patient and son, after a long discussion, decision made to proceed with LCB, no CPR, cardioversion, trach/peg.  Will extubate today, may reintubate for now if needed.  Disposition / Summary of Today's Plan 12/26/17   Extubate today Active diureses May reintubate for now if deteriorates but when son arrives will need to discuss further, LCB for now as above.  Best Practice / Goals of Care / Disposition.   DVT prophylaxis: Compression hose anticoagulation held further on Coumadin no accurate INR is available at time of admission GI prophylaxis: H2 blocker Diet: N.p.o. due to nausea and vomiting Mobility: Bedrest Code Status: Currently full code despite having a DNR sheet at bedside Family Communication: 2019 no family at bedside  Labs   CBC: Recent Labs  Lab 12/24/17 0552 12/24/17 1023 12/25/17 0853 12/26/17 0623  WBC 12.5* 18.6* 13.1* 14.6*  HGB 11.9* 10.7* 9.7* 11.3*  HCT 39.0 34.3* 30.2* 34.7*  MCV 102.1* 100.6* 97.1 97.2  PLT 483* 335 358 300   Basic Metabolic Panel: Recent Labs  Lab 12/24/17 0552 12/24/17 1023 12/25/17 0853 12/25/17 1710 12/26/17 0623  NA 141 142 142  --  143  K 3.6 3.2* 3.2*  --  3.4*  CL 98 99 107  --  105  CO2 23 27 23   --  23  GLUCOSE 257* 156* 109*  --  137*  BUN 69* 70* 67*  --  62*  CREATININE 2.81* 2.74* 3.45*  --  3.42*  CALCIUM 9.5 9.5 8.8*  --  9.5  MG  --  1.5* 1.5*  --  2.3  PHOS  --  4.6 3.7 3.7 3.9   GFR: Estimated Creatinine Clearance: 12.2 mL/min (A) (by C-G formula based on SCr of 3.42 mg/dL (H)). Recent Labs  Lab 12/24/17 0552 12/24/17 0617 12/24/17 1023 12/24/17 1032 12/25/17 0853 12/26/17 0623  PROCALCITON  --   --  1.38  --   --   --   WBC 12.5*  --  18.6*  --  13.1* 14.6*  LATICACIDVEN  --  6.90*  --  2.8*  --   --    Liver Function Tests: Recent Labs  Lab 12/24/17 1023  AST 33  ALT 16    ALKPHOS 66  BILITOT 1.0  PROT 5.6*  ALBUMIN 3.0*   No results for input(s): LIPASE, AMYLASE in the last 168 hours. No results for input(s): AMMONIA in the last 168 hours. ABG    Component Value Date/Time   PHART 7.381 12/26/2017 0319   PCO2ART 40.0 12/26/2017 0319   PO2ART 97.0 12/26/2017 0319   HCO3 23.7 12/26/2017 0319   TCO2 25 12/26/2017 0319   ACIDBASEDEF 1.0 12/26/2017 0319   O2SAT 97.0 12/26/2017 0319    Coagulation Profile: Recent Labs  Lab 12/24/17 1023  INR 1.16   Cardiac Enzymes: Recent Labs  Lab 12/24/17 1023 12/24/17 1358 12/24/17 2055  TROPONINI 0.17* 0.20* 0.18*   HbA1C: Hgb A1c MFr Bld  Date/Time Value Ref Range Status  06/14/2016 02:44 AM 5.0 4.8 - 5.6 % Final    Comment:    (  NOTE)         Pre-diabetes: 5.7 - 6.4         Diabetes: >6.4         Glycemic control for adults with diabetes: <7.0   12/15/2014 01:27 AM 5.5 4.8 - 5.6 % Final    Comment:    (NOTE)         Pre-diabetes: 5.7 - 6.4         Diabetes: >6.4         Glycemic control for adults with diabetes: <7.0    CBG: Recent Labs  Lab 12/25/17 1529 12/25/17 1957 12/26/17 0010 12/26/17 0451 12/26/17 0839  GLUCAP 104* 96 83 122* 121*   Allergies Allergies  Allergen Reactions  . Nubain [Nalbuphine Hcl] Hives    Went into cardiac arrest   . Codeine Hives and Nausea Only    Has tolerated Oxycodone  . Darvocet [Propoxyphene N-Acetaminophen] Nausea Only    Home meds  Prior to Admission medications   Medication Sig Start Date End Date Taking? Authorizing Provider  allopurinol (ZYLOPRIM) 100 MG tablet Take 100 mg by mouth daily.   Yes [provider]  aluminum-magnesium hydroxide-simethicone (MAALOX) 200-200-20 MG/5ML SUSP Take 30 mLs by mouth 3 (three) times daily as needed (for indegestion).   Yes [provider]  amiodarone (PACERONE) 100 MG tablet Take 100 mg by mouth daily.   Yes [provider]  atorvastatin (LIPITOR) 10 MG tablet Take 10 mg by  mouth daily at 6 PM.    Yes [provider]  calcitRIOL (ROCALTROL) 0.25 MCG capsule Take 0.25 mcg by mouth daily.    Yes [provider]  cholecalciferol (VITAMIN D) 1000 units tablet Take 2,000 Units by mouth at bedtime.   Yes [provider]  cycloSPORINE (RESTASIS) 0.05 % ophthalmic emulsion Place 1 drop into both eyes 2 (two) times daily.    Yes [provider]  docusate sodium (COLACE) 100 MG capsule Take 1 capsule (100 mg total) by mouth 2 (two) times daily. 12/08/17  Yes Leroy Sea, MD  donepezil (ARICEPT) 10 MG tablet Take 10 mg by mouth at bedtime.    Yes [provider]  DULoxetine (CYMBALTA) 60 MG capsule Take 60 mg by mouth daily at 6 PM.    Yes [provider]  ferrous sulfate 325 (65 FE) MG tablet Take 325 mg by mouth daily.    Yes [provider]  gabapentin (NEURONTIN) 100 MG capsule Take 200 mg by mouth 2 (two) times daily.   Yes [provider]  ipratropium-albuterol (DUONEB) 0.5-2.5 (3) MG/3ML SOLN Take 3 mLs by nebulization every 6 (six) hours as needed (wheezing/shortness of breath).   Yes [provider]  ipratropium-albuterol (DUONEB) 0.5-2.5 (3) MG/3ML SOLN Take 3 mLs by nebulization 2 (two) times daily.   Yes [provider]  isosorbide mononitrate (IMDUR) 30 MG 24 hr tablet Take 1 tablet (30 mg total) by mouth daily. Patient taking differently: Take 60 mg by mouth daily.  08/05/16 12/24/25 Yes Bhagat, Bhavinkumar, PA  levothyroxine (SYNTHROID, LEVOTHROID) 100 MCG tablet Take 1 tablet (100 mcg total) by mouth daily before breakfast. 11/12/17  Yes Berton Mount I, MD  lidocaine (LIDODERM) 5 % Place 1 patch onto the skin daily. Apply to lower back as needed for pain.   Yes [provider]  linaclotide (LINZESS) 290 MCG CAPS capsule Take 290 mcg by mouth daily.    Yes [provider]  Menthol, Topical Analgesic, (BIOFREEZE) 4 %  GEL Apply 1 application topically 2  (two) times daily.   Yes [provider]  metolazone (ZAROXOLYN) 2.5 MG tablet 1 pill every Wednesday as needed once a week for weight gain over 2.5 pounds, edema or shortness of breath. Patient taking differently: Take 2.5 mg by mouth See admin instructions. 1 pill every Wednesday as needed once a week for weight gain over 2.5 pounds, edema or shortness of breath. 12/08/17  Yes Leroy Sea, MD  metoprolol tartrate (LOPRESSOR) 25 MG tablet Take 0.5 tablets (12.5 mg total) by mouth 2 (two) times daily. 12/08/17  Yes Leroy Sea, MD  morphine 20 MG/5ML solution Take 10 mg by mouth every 2 (two) hours as needed for pain.   Yes [provider]  Multiple Vitamin (MULTIVITAMIN WITH MINERALS) TABS tablet Take 1 tablet by mouth daily.   Yes [provider]  mupirocin ointment (BACTROBAN) 2 % Place 1 application into the nose 2 (two) times daily. 11/12/17  Yes Barnetta Chapel, MD  nitroGLYCERIN (NITROSTAT) 0.4 MG SL tablet Place 0.4 mg under the tongue every 5 (five) minutes as needed for chest pain (MAX 3 TABLETS).    Yes [provider]  ondansetron (ZOFRAN) 4 MG tablet Take 4 mg by mouth every 8 (eight) hours as needed for nausea or vomiting.   Yes [provider]  polyethylene glycol (MIRALAX / GLYCOLAX) packet Take 17 g by mouth daily.   Yes [provider]  torsemide (DEMADEX) 20 MG tablet Take 40 mg by mouth daily.    Yes [provider]  warfarin (COUMADIN) 3 MG tablet Take 3 mg by mouth daily.   Yes [provider]  bisacodyl (DULCOLAX) 10 MG suppository Place 1 suppository (10 mg total) rectally daily as needed for moderate constipation. Patient not taking: Reported on 12/24/2017 12/08/17   Leroy Sea, MD  HYDROcodone-acetaminophen (NORCO/VICODIN) 5-325 MG tablet Take 1 tablet by mouth every 4 (four) hours as needed for severe pain. Patient not taking: Reported on 12/24/2017 11/12/17   Berton Mount I, MD      The patient is critically ill with multiple organ systems failure and requires high complexity decision making for assessment and support, frequent evaluation and titration of therapies, application of advanced monitoring technologies and extensive interpretation of multiple databases.   Critical Care Time devoted to patient care services described in this note is  37  Minutes. This time reflects time of care of this signee Dr Koren Bound. This critical care time does not reflect procedure time, or teaching time or supervisory time of PA/NP/Med student/Med Resident etc but could involve care discussion time.  Alyson Reedy, M.D. [facility] Pulmonary/Critical Care Medicine. Pager: [phone]. After hours pager: [phone].  12/26/2017, 9:40 AM

## 2017-12-26 NOTE — Progress Notes (Signed)
Spoke with Dr Arlean Hopping re PICC line.  States ok to place PICC in dominant arm only.

## 2017-12-26 NOTE — Progress Notes (Signed)
eLink Physician-Brief Progress Note Patient Name: [name] DOB: 07/01/1932 MRN: [id number]   Date of Service  12/26/2017  HPI/Events of Note  Uncontrolled hypertension  eICU Interventions  Hydralazine 10 mg iv Q 4 Hrs prn SBP > 160 or MAP > 95        [name] U [name] 12/26/2017, 3:45 AM

## 2017-12-26 NOTE — Progress Notes (Signed)
ANTICOAGULATION CONSULT NOTE - Follow Up Consult  Pharmacy Consult for heparin Indication: PAF/TAVR  Labs: Recent Labs    12/24/17 0552 12/24/17 1023 12/24/17 1358 12/24/17 2055 12/25/17 0853 12/25/17 2323  HGB 11.9* 10.7*  --   --  9.7*  --   HCT 39.0 34.3*  --   --  30.2*  --   PLT 483* 335  --   --  358  --   APTT  --  28  --   --   --   --   LABPROT  --  14.7  --   --   --   --   INR  --  1.16  --   --   --   --   HEPARINUNFRC  --   --   --   --   --  0.76*  CREATININE 2.81* 2.74*  --   --  3.45*  --   TROPONINI  --  0.17* 0.20* 0.18*  --   --     Assessment: 85yo female slightly supratherapeutic on heparin with initial dosing for low INR; RN notes no overt signs of bleeding; of note pt is difficult stick, awaiting PICC placement.  Goal of Therapy:  Heparin level 0.3-0.7 units/ml   Plan:  Will decrease heparin gtt by ~1 units/kg/hr to 800 units/hr and check level in 8 hours.    Vernard Gambles, PharmD, BCPS  12/26/2017,12:12 AM

## 2017-12-26 NOTE — Progress Notes (Signed)
eLink Physician-Brief Progress Note Patient Name: [name] DOB: [birth year] MRN: [id number]   Date of Service  12/26/2017  HPI/Events of Note  Patient c/o pain - Patient is on Morphine and Norco at home. Bad cough. Nurse not comfortable giving patient PO pain medication.   eICU Interventions  Will order: 1. Fentanyl 25 mcg IV Q 4 hours PRN pain.      Intervention Category Intermediate Interventions: Pain - evaluation and management  Sommer,Steven Eugene 12/26/2017, 11:59 PM

## 2017-12-26 NOTE — Progress Notes (Addendum)
Subjective: Patient was sedated and on vent this morning when examining patient.  Objective: Vital signs in last 24 hours: Temp:  [97.9 F (36.6 C)-99 F (37.2 C)] 99 F (37.2 C) (09/07 0817) Pulse Rate:  [63-127] 96 (09/07 0915) Resp:  [18-27] 20 (09/07 0915) BP: (85-198)/(39-125) 139/45 (09/07 0900) SpO2:  [95 %-100 %] 100 % (09/07 0915) FiO2 (%):  [30 %-40 %] 40 % (09/07 0805) Weight:  [73.5 kg] 73.5 kg (09/07 0433) Weight change: 0.5 kg  Intake/Output from previous day: 09/06 0701 - 09/07 0700 In: 1295.8 [I.V.:607.7; NG/GT:436.5; IV Piggyback:251.6] Out: 850 [Urine:850] Intake/Output this shift: Total I/O In: 476.8 [I.V.:36.6; NG/GT:90; IV Piggyback:350.2] Out: 415 [Urine:415]  Physical Exam  Constitutional: She appears well-developed and well-nourished. No distress.  HENT:  Head: Normocephalic and atraumatic.  Eyes: Conjunctivae are normal.  Cardiovascular: Normal rate, regular rhythm and normal heart sounds.  Respiratory: Effort normal and breath sounds normal. No respiratory distress. She has no wheezes.  GI: Soft. Bowel sounds are normal. She exhibits no distension. There is no tenderness.  Musculoskeletal: She exhibits no edema.  Neurological: She is alert.  Skin: She is not diaphoretic. No erythema.  Psychiatric:  Slightly agitated while on vent   Lab Results: Recent Labs    12/25/17 0853 12/26/17 0623  WBC 13.1* 14.6*  HGB 9.7* 11.3*  HCT 30.2* 34.7*  PLT 358 300   BMET:  Recent Labs    12/25/17 0853 12/26/17 0623  NA 142 143  K 3.2* 3.4*  CL 107 105  CO2 23 23  GLUCOSE 109* 137*  BUN 67* 62*  CREATININE 3.45* 3.42*  CALCIUM 8.8* 9.5   No results for input(s): PTH in the last 72 hours. Iron Studies: No results for input(s): IRON, TIBC, TRANSFERRIN, FERRITIN in the last 72 hours. Studies/Results: Dg Chest Port 1 View  Result Date: 12/26/2017 CLINICAL DATA:  History of ETT EXAM: PORTABLE CHEST 1 VIEW COMPARISON:  December 25, 2017 FINDINGS:  The ETT is in good position. The NG tube terminates below today's film. No pneumothorax. Small effusion with underlying opacity in left base is stable. The focal opacity in the medial right base persists but is less prominent the interval. Mild increased interstitial markings raise the possibility of mild edema/pulmonary venous congestion. No other change. IMPRESSION: 1. Support apparatus as above. 2. Improving infiltrate in medial right lung base. 3. Small left effusion with underlying opacity. 4. Suggested pulmonary venous congestion versus mild edema. Electronically Signed   By: Gerome Sam III M.D   On: 12/26/2017 07:26   Dg Chest Port 1 View  Result Date: 12/25/2017 CLINICAL DATA:  Respiratory failure EXAM: PORTABLE CHEST 1 VIEW COMPARISON:  Chest x-rays dated 12/24/2017 and 12/18/2017. FINDINGS: Today's study is hypoinspiratory. Increasingly dense opacity at the RIGHT lung base, possibly accentuated by the low lung volumes on today's study. Persistent opacity at the LEFT lung base. No pneumothorax seen. Endotracheal tube is well positioned with tip approximately 2 cm above the carina. Enteric tube passes below the diaphragm. Dual lead pacemaker/ICD apparatus appears stable in position. Valvular hardware appears stable in position. Heart size and mediastinal contours appear stable. IMPRESSION: 1. Hypoinspiratory exam. Increasingly dense opacity within the RIGHT lower lung, compatible with the pneumonia at described on chest CT of 12/24/2017, possibly worsened but more likely accentuated by the low lung volumes on today's study. 2. Stable opacity at the LEFT lung base, pneumonia versus atelectasis, as also demonstrated on yesterday's chest CT. 3. Endotracheal tube appears well positioned with  tip just above the level of the carina. Electronically Signed   By: Bary Richard M.D.   On: 12/25/2017 09:19   [place] Ekg Site Rite  Result Date: 12/25/2017 If Site Rite image not attached, placement could not be  confirmed due to current cardiac rhythm.  Assessment/Plan:  Acute on chronic kidney injury Patient was admitted with acute worsening of baseline ckd IV (Cr ranging 2.2-2.5). Her creatinine today is 3.42 from 3.45 yesterday.   She put out of urine yesterday with IV lasix. Will continue to diurese today at lower rate. Patient was given IV lasix 120mg  in the morning and will be given another IV lasix 80mg  bid.   LOS: 2 days   Vahini Chundi 12/26/2017,10:15 AM   Renal Attending: Mayer Camel status improved and plans for extubation.  Will cont to diurese some today to optimize pulmonary status.  Renal fct is stable. Bp now up and stable. Casimiro Needle, MD

## 2017-12-26 NOTE — Procedures (Signed)
Extubation Procedure Note  Patient Details:   Name: [name] DOB: 15-Jul-1932 MRN: [id number]   Airway Documentation:    Vent end date: 12/26/17 Vent end time: 1140   Evaluation  O2 sats: stable throughout Complications: No apparent complications Patient did tolerate procedure well. Bilateral Breath Sounds: Rhonchi   Yes, patient able to speak. Checked for cuff leak. Patient extubated to 4L [place]. No stridor noted. Tolerating well at this time. Will continue to monitor.   Rance Muir 12/26/2017, 11:47 AM

## 2017-12-26 NOTE — Progress Notes (Signed)
Spoke with Dr Jens Som re PICC to be placed in RUE, dominant arm per Dr Arlean Hopping.  Right sided pacemaker present.  Dr Jens Som approves for PICC to be placed in Right arm.

## 2017-12-27 ENCOUNTER — Inpatient Hospital Stay (HOSPITAL_COMMUNITY): Payer: Medicare Other

## 2017-12-27 DIAGNOSIS — E872 Acidosis, unspecified: Secondary | ICD-10-CM

## 2017-12-27 DIAGNOSIS — N184 Chronic kidney disease, stage 4 (severe): Secondary | ICD-10-CM

## 2017-12-27 DIAGNOSIS — N170 Acute kidney failure with tubular necrosis: Secondary | ICD-10-CM

## 2017-12-27 DIAGNOSIS — R748 Abnormal levels of other serum enzymes: Secondary | ICD-10-CM

## 2017-12-27 DIAGNOSIS — I5033 Acute on chronic diastolic (congestive) heart failure: Secondary | ICD-10-CM

## 2017-12-27 LAB — BASIC METABOLIC PANEL
ANION GAP: 14 (ref 5–15)
BUN: 68 mg/dL — ABNORMAL HIGH (ref 8–23)
CALCIUM: 8.8 mg/dL — AB (ref 8.9–10.3)
CO2: 24 mmol/L (ref 22–32)
CREATININE: 3.63 mg/dL — AB (ref 0.44–1.00)
Chloride: 104 mmol/L (ref 98–111)
GFR calc non Af Amer: 11 mL/min — ABNORMAL LOW (ref >60)
GFR, EST AFRICAN AMERICAN: 12 mL/min — AB (ref >60)
Glucose, Bld: 93 mg/dL (ref 70–99)
Potassium: 3.2 mmol/L — ABNORMAL LOW (ref 3.5–5.1)
SODIUM: 142 mmol/L (ref 135–145)

## 2017-12-27 LAB — CBC
HEMATOCRIT: 28.1 % — AB (ref 36.0–46.0)
HEMOGLOBIN: 8.8 g/dL — AB (ref 12.0–15.0)
MCH: 31 pg (ref 26.0–34.0)
MCHC: 31.3 g/dL (ref 30.0–36.0)
MCV: 98.9 fL (ref 78.0–100.0)
Platelets: 313 10*3/uL (ref 150–400)
RBC: 2.84 MIL/uL — ABNORMAL LOW (ref 3.87–5.11)
RDW: 15.6 % — AB (ref 11.5–15.5)
WBC: 12.5 10*3/uL — AB (ref 4.0–10.5)

## 2017-12-27 LAB — PHOSPHORUS: PHOSPHORUS: 4 mg/dL (ref 2.5–4.6)

## 2017-12-27 LAB — HEPARIN LEVEL (UNFRACTIONATED)
HEPARIN UNFRACTIONATED: 0.47 [IU]/mL (ref 0.30–0.70)
HEPARIN UNFRACTIONATED: 0.54 [IU]/mL (ref 0.30–0.70)

## 2017-12-27 LAB — HEMOGLOBIN AND HEMATOCRIT, BLOOD
HCT: 29.9 % — ABNORMAL LOW (ref 36.0–46.0)
Hemoglobin: 9.4 g/dL — ABNORMAL LOW (ref 12.0–15.0)

## 2017-12-27 LAB — GLUCOSE, CAPILLARY
GLUCOSE-CAPILLARY: 96 mg/dL (ref 70–99)
GLUCOSE-CAPILLARY: 107 mg/dL — AB (ref 70–99)
GLUCOSE-CAPILLARY: 100 mg/dL — AB (ref 70–99)
Glucose-Capillary: 89 mg/dL (ref 70–99)
Glucose-Capillary: 90 mg/dL (ref 70–99)
Glucose-Capillary: 92 mg/dL (ref 70–99)
Glucose-Capillary: 97 mg/dL (ref 70–99)

## 2017-12-27 LAB — MAGNESIUM: Magnesium: 2.1 mg/dL (ref 1.7–2.4)

## 2017-12-27 MED ORDER — FUROSEMIDE 10 MG/ML IJ SOLN
40.0000 mg | Freq: Four times a day (QID) | INTRAMUSCULAR | Status: AC
  Administered 2017-12-27 (×2): 40 mg via INTRAVENOUS
  Filled 2017-12-27 (×2): qty 4

## 2017-12-27 MED ORDER — POTASSIUM CHLORIDE CRYS ER 20 MEQ PO TBCR
40.0000 meq | EXTENDED_RELEASE_TABLET | Freq: Three times a day (TID) | ORAL | Status: AC
  Administered 2017-12-27 (×2): 40 meq via ORAL
  Filled 2017-12-27 (×2): qty 2

## 2017-12-27 MED ORDER — METOLAZONE 10 MG PO TABS
5.0000 mg | ORAL_TABLET | Freq: Every day | ORAL | Status: AC
  Administered 2017-12-27: 5 mg via ORAL
  Filled 2017-12-27: qty 1

## 2017-12-27 MED ORDER — HYDROCODONE-ACETAMINOPHEN 5-325 MG PO TABS
1.0000 | ORAL_TABLET | ORAL | Status: DC | PRN
  Administered 2017-12-28 – 2018-01-01 (×4): 1 via ORAL
  Filled 2017-12-27 (×4): qty 1

## 2017-12-27 NOTE — Progress Notes (Signed)
4 AM chest PT not done due to pt was sleeping.

## 2017-12-27 NOTE — Evaluation (Signed)
Physical Therapy Evaluation Patient Details Name: [name] MRN: [id number] DOB: Oct 02, 1932 Today's Date: 12/27/2017   History of Present Illness  84 year old female with extensive past medical history that is includes but not limited to O2 dependent COPD, morbid obesity, congestive heart failure with a EF of 20%, aortic stenosis severe, paroxysmal atrial fibrillation on Coumadin, dementia, chronic kidney disease with a base creatinine of 2.2, history of pulmonary embolism, who was recently discharged from [facility] on 12/07/2017 for similar episode of acute shortness of breath responding to Lasix. She returned to [facility] emergency department on 12/24/2017 again with acute shortness of breath along with thick sputum she was placed on BiPAP did not tolerate it developed frothy blood-tinged sputum; Intubated 9/5-9/7  Clinical Impression   Pt admitted with above diagnosis. Pt currently with functional limitations due to the deficits listed below (see PT Problem List). Was working on functional mobility at [employer] prior to admission; presents with decr functional mobility, deconditioning, weakness;  Pt will benefit from skilled PT to increase their independence and safety with mobility to allow discharge to the venue listed below.       Follow Up Recommendations SNF;Supervision/Assistance - 24 hour    Equipment Recommendations  None recommended by PT    Recommendations for Other Services       Precautions / Restrictions Precautions Precautions: Fall Precaution Comments: Ntoed in chart review she has a RLE brace -- brace was not in room on PT eval Restrictions Weight Bearing Restrictions: No      Mobility  Bed Mobility Overal bed mobility: Needs Assistance Bed Mobility: Supine to Sit     Supine to sit: Mod assist     General bed mobility comments: Cues for scooting to EOB prior to sitting up; good scooting of body to EOB; Mod assist to elevate trunk to  sit  Transfers Overall transfer level: Needs assistance Equipment used: 2 person hand held assist Transfers: Sit to/from Stand;Stand Pivot Transfers Sit to Stand: +2 physical assistance;Max assist Stand pivot transfers: +2 safety/equipment;Max assist       General transfer comment: pt very nervous to weight shift anteriorly to initiate; close guard of knees for stability -- no gross knee buckling noted  Ambulation/Gait                Stairs            Wheelchair Mobility    Modified Rankin (Stroke Patients Only)       Balance     Sitting balance-Leahy Scale: Poor                                       Pertinent Vitals/Pain Pain Assessment: Faces Faces Pain Scale: Hurts little more Pain Location: did not state just grimaced with movement; Reported back soreness once in recliner -- improved by positioning with pillows Pain Descriptors / Indicators: Discomfort Pain Intervention(s): Monitored during session    Home Living Family/patient expects to be discharged to:: Skilled nursing facility                 Additional Comments: [facility]    Prior Function Level of Independence: Needs assistance   Gait / Transfers Assistance Needed: Reports she walks ~3 times a week with OT and PT using a walker  ADL's / Homemaking Assistance Needed: Reports she bathes and dresses herself with set up assistance  Hand Dominance   Dominant Hand: Right    Extremity/Trunk Assessment   Upper Extremity Assessment Upper Extremity Assessment: Defer to OT evaluation    Lower Extremity Assessment Lower Extremity Assessment: Generalized weakness RLE Deficits / Details: knee ROM 15-65 degrees with previous knee injury, grossly 2/5 LLE Deficits / Details: Grossly 3/5    Cervical / Trunk Assessment Cervical / Trunk Assessment: Kyphotic  Communication   Communication: HOH  Cognition Arousal/Alertness: Awake/alert Behavior During Therapy:  Flat affect Overall Cognitive Status: Within Functional Limits for tasks assessed(for simple mobility)                                        General Comments General comments (skin integrity, edema, etc.): Session conducted on 4 L supplemental O2, and O2 sats ranged 93-99%; BP 130/66 in recliner    Exercises     Assessment/Plan    PT Assessment Patient needs continued PT services  PT Problem List Decreased strength;Decreased range of motion;Decreased activity tolerance;Decreased balance;Decreased mobility;Decreased knowledge of use of DME;Decreased safety awareness;Decreased knowledge of precautions;Other (comment);Pain       PT Treatment Interventions DME instruction;Gait training;Functional mobility training;Therapeutic activities;Therapeutic exercise;Balance training;Patient/family education    PT Goals (Current goals can be found in the Care Plan section)  Acute Rehab PT Goals Patient Stated Goal: really wants a bath this am PT Goal Formulation: With patient Time For Goal Achievement: 01/04/18 Potential to Achieve Goals: Good    Frequency Min 2X/week   Barriers to discharge        Co-evaluation               AM-PAC PT "6 Clicks" Daily Activity  Outcome Measure Difficulty turning over in bed (including adjusting bedclothes, sheets and blankets)?: A Lot Difficulty moving from lying on back to sitting on the side of the bed? : Unable Difficulty sitting down on and standing up from a chair with arms (e.g., wheelchair, bedside commode, etc,.)?: Unable Help needed moving to and from a bed to chair (including a wheelchair)?: Total Help needed walking in hospital room?: Total Help needed climbing 3-5 steps with a railing? : Total 6 Click Score: 7    End of Session Equipment Utilized During Treatment: Gait belt;Oxygen Activity Tolerance: Patient limited by fatigue Patient left: in chair;with call bell/phone within reach;with chair alarm set Nurse  Communication: Mobility status PT Visit Diagnosis: Muscle weakness (generalized) (M62.81);Pain;History of falling (Z91.81)    Time: 3235-5732 PT Time Calculation (min) (ACUTE ONLY): 26 min   Charges:   PT Evaluation $PT Eval Moderate Complexity: 1 Mod PT Treatments $Therapeutic Activity: 8-22 mins        Van Clines, PT  Acute Rehabilitation Services Pager [phone] Office [phone]   Levi Aland 12/27/2017, 11:23 AM

## 2017-12-27 NOTE — Progress Notes (Signed)
[name]  ONG:[id number] DOB: [birth year] DOA: 12/24/2017 PCP: Pearson Grippe, MD    LOS: 3 days   Reason for Consult / Chief Complaint:  Acute on chronic respiratory failure  Consulting MD and date:    HPI/Summary of hospital stay:  84 year old female with extensive past medical history that is includes but not limited to O2 dependent COPD, morbid obesity, congestive heart failure with a EF of 20%, aortic stenosis severe, paroxysmal atrial fibrillation on Coumadin, dementia, chronic kidney disease with a base creatinine of 2.2, history of pulmonary embolism, who was recently discharged from [facility] on 12/07/2017 for similar episode of acute shortness of breath responding to Lasix. She returns to [facility] emergency department on 12/24/2017 again with acute shortness of breath along with thick sputum she was placed on BiPAP did not tolerate it developed frothy blood-tinged sputum and when asked if she wanted to be intubated was intubated by the emergency department physician. Pulmonary critical care was called to admit.  Subjective:  No events overnight, no new complaints  Objective   Blood pressure (!) 155/65, pulse 81, temperature 98.4 F (36.9 C), temperature source Axillary, resp. rate 14, height 5\' 4"  (1.626 m), weight 74 kg, SpO2 100 %.    Vent Mode: PSV;CPAP PEEP:  [5 cmH20] 5 cmH20 Pressure Support:  [5 cmH20-10 cmH20] 5 cmH20   Intake/Output Summary (Last 24 hours) at 12/27/2017 0914 Last data filed at 12/27/2017 0800 Gross per 24 hour  Intake 1090.56 ml  Output 2580 ml  Net -1489.44 ml   Filed Weights   12/25/17 0500 12/26/17 0433 12/27/17 0500  Weight: 73 kg 73.5 kg 74 kg   Examination: General: Chronically ill appearing female, NAD HENT: [place]/AT, PERRL, EOM-I and MMM Lungs: Bibasilar crackles Cardiovascular: RRR, Nl S1/S2 and -M/R/G Abdomen: Obese, soft, NT, ND and +BS Extremities: 1+ edema Neuro: Alert and interactive, moving all ext to command GU:  Foley catheter  Consults: date of consult/date signed off & final recs:  Nephrology 9/6>>>  Procedures: 12/24/2017 intubation per emergency department physician>>  Significant Diagnostic Tests: 12/24/2017 CT of the chest pending>>  Micro Data: 12/24/2017 sputum>> 12/24/2017 blood>> 12/24/2017 urine>>  Antimicrobials:  12/24/2017 vancomycin>>9/7 12/24/2017 Zithromax>>9/9 12/24/2017 cefepime>>  Resolved Hospital Problem list    I reviewed CXR myself, no acute disease noted  Assessment & Plan:  Vent dependent respiratory failure secondary to acute pulmonary edema suspected overload in setting congestive heart failure with a EF of 20%.  Chronic obstructive pulmonary disease O2 dependent at 2 L daily.  Noted to have hemoptysis prior to intubation Suspected pneumonia bilateral - Titrate O2 for sat of 88-92% - BD as ordered - Continue heparin drip - Antimicrobial therapy as above - Flutter valve - IS  Congestive heart failure with EF of 20% followed by lumbar cardiology History of paroxysmal atrial fibrillation and pulmonary embolism on chronic Coumadin therapy Dual-chamber pacemaker in place Severe aortic stenosis Status post TAVR - Lasix 40 mg IV q6 x3 doses - K-dur 40 meq PO x2 doses - BMET in AM - Zaroxolyn 5 mg PO x1 - Controlled ventricular rate as needed  Chronic kidney disease with baseline creatinine of 2.2 followed by Dr. Allena Katz - Lasix 40 mg IV q6 x3 doses and zaroxolyn 5 mg x1 - Nephrology consult appreciated - Replace electrolytes as indicated  Diabetes mellitus - ISS - CBGs  Anion gap acidosis ,elevated lactic acid which may be a component of sepsis versus low blood flow state. - Trend  lactic acid - Lasix 40 mg IV q6 x3 doses - KVO IVF  Hypothyroidism - TSH - Thyroxin to change to 100 mcg PO daily  Known dementia without behavioral disturbance - D/C propofol for extubation - D/C fentanyl for extubation  Nausea and vomiting - Gastric tube to low intermittent  suction - Antiemetic as needed - Nutrition for TF  GOC: discussion with patient and son, after a long discussion, decision made to proceed with LCB, no CPR, cardioversion, trach/peg.  Reintubate short term only if needs be  Disposition / Summary of Today's Plan 12/27/17   Transfer to tele and to [facility] service with PCCM off 9/9, discussed with PCCM-NP and TRH-MD  Best Practice / Goals of Care / Disposition.   DVT prophylaxis: Compression hose anticoagulation held further on Coumadin no accurate INR is available at time of admission GI prophylaxis: H2 blocker Diet: N.p.o. due to nausea and vomiting Mobility: Bedrest Code Status: Currently full code despite having a DNR sheet at bedside Family Communication: 2019 no family at bedside  Labs   CBC: Recent Labs  Lab 12/24/17 0552 12/24/17 1023 12/25/17 0853 12/26/17 0623 12/27/17 0221  WBC 12.5* 18.6* 13.1* 14.6* 12.5*  HGB 11.9* 10.7* 9.7* 11.3* 8.8*  HCT 39.0 34.3* 30.2* 34.7* 28.1*  MCV 102.1* 100.6* 97.1 97.2 98.9  PLT 483* 335 358 300 313   Basic Metabolic Panel: Recent Labs  Lab 12/24/17 0552  12/24/17 1023 12/25/17 0853 12/25/17 1710 12/26/17 0623 12/26/17 1614 12/27/17 0221  NA 141  --  142 142  --  143  --  142  K 3.6  --  3.2* 3.2*  --  3.4*  --  3.2*  CL 98  --  99 107  --  105  --  104  CO2 23  --  27 23  --  23  --  24  GLUCOSE 257*  --  156* 109*  --  137*  --  93  BUN 69*  --  70* 67*  --  62*  --  68*  CREATININE 2.81*  --  2.74* 3.45*  --  3.42*  --  3.63*  CALCIUM 9.5  --  9.5 8.8*  --  9.5  --  8.8*  MG  --   --  1.5* 1.5*  --  2.3  --  2.1  PHOS  --    < > 4.6 3.7 3.7 3.9 3.3 4.0   < > = values in this interval not displayed.   GFR: Estimated Creatinine Clearance: 11.6 mL/min (A) (by C-G formula based on SCr of 3.63 mg/dL (H)). Recent Labs  Lab 12/24/17 0617 12/24/17 1023 12/24/17 1032 12/25/17 0853 12/26/17 0623 12/27/17 0221  PROCALCITON  --  1.38  --   --   --   --   WBC  --  18.6*  --   13.1* 14.6* 12.5*  LATICACIDVEN 6.90*  --  2.8*  --   --   --    Liver Function Tests: Recent Labs  Lab 12/24/17 1023  AST 33  ALT 16  ALKPHOS 66  BILITOT 1.0  PROT 5.6*  ALBUMIN 3.0*   No results for input(s): LIPASE, AMYLASE in the last 168 hours. No results for input(s): AMMONIA in the last 168 hours. ABG    Component Value Date/Time   PHART 7.381 12/26/2017 0319   PCO2ART 40.0 12/26/2017 0319   PO2ART 97.0 12/26/2017 0319   HCO3 23.7 12/26/2017 0319   TCO2 25 12/26/2017 0319  ACIDBASEDEF 1.0 12/26/2017 0319   O2SAT 97.0 12/26/2017 0319    Coagulation Profile: Recent Labs  Lab 12/24/17 1023  INR 1.16   Cardiac Enzymes: Recent Labs  Lab 12/24/17 1023 12/24/17 1358 12/24/17 2055  TROPONINI 0.17* 0.20* 0.18*   HbA1C: Hgb A1c MFr Bld  Date/Time Value Ref Range Status  06/14/2016 02:44 AM 5.0 4.8 - 5.6 % Final    Comment:    (NOTE)         Pre-diabetes: 5.7 - 6.4         Diabetes: >6.4         Glycemic control for adults with diabetes: <7.0   12/15/2014 01:27 AM 5.5 4.8 - 5.6 % Final    Comment:    (NOTE)         Pre-diabetes: 5.7 - 6.4         Diabetes: >6.4         Glycemic control for adults with diabetes: <7.0    CBG: Recent Labs  Lab 12/26/17 1557 12/26/17 2012 12/27/17 0041 12/27/17 0442 12/27/17 0708  GLUCAP 92 95 97 96 107*   Allergies Allergies  Allergen Reactions  . Nubain [Nalbuphine Hcl] Hives    Went into cardiac arrest   . Codeine Hives and Nausea Only    Has tolerated Oxycodone  . Darvocet [Propoxyphene N-Acetaminophen] Nausea Only    Home meds  Prior to Admission medications   Medication Sig Start Date End Date Taking? Authorizing Provider  allopurinol (ZYLOPRIM) 100 MG tablet Take 100 mg by mouth daily.   Yes [provider]  aluminum-magnesium hydroxide-simethicone (MAALOX) 200-200-20 MG/5ML SUSP Take 30 mLs by mouth 3 (three) times daily as needed (for indegestion).   Yes [provider]  amiodarone  (PACERONE) 100 MG tablet Take 100 mg by mouth daily.   Yes [provider]  atorvastatin (LIPITOR) 10 MG tablet Take 10 mg by mouth daily at 6 PM.    Yes [provider]  calcitRIOL (ROCALTROL) 0.25 MCG capsule Take 0.25 mcg by mouth daily.    Yes [provider]  cholecalciferol (VITAMIN D) 1000 units tablet Take 2,000 Units by mouth at bedtime.   Yes [provider]  cycloSPORINE (RESTASIS) 0.05 % ophthalmic emulsion Place 1 drop into both eyes 2 (two) times daily.    Yes [provider]  docusate sodium (COLACE) 100 MG capsule Take 1 capsule (100 mg total) by mouth 2 (two) times daily. 12/08/17  Yes Leroy Sea, MD  donepezil (ARICEPT) 10 MG tablet Take 10 mg by mouth at bedtime.    Yes [provider]  DULoxetine (CYMBALTA) 60 MG capsule Take 60 mg by mouth daily at 6 PM.    Yes [provider]  ferrous sulfate 325 (65 FE) MG tablet Take 325 mg by mouth daily.    Yes [provider]  gabapentin (NEURONTIN) 100 MG capsule Take 200 mg by mouth 2 (two) times daily.   Yes [provider]  ipratropium-albuterol (DUONEB) 0.5-2.5 (3) MG/3ML SOLN Take 3 mLs by nebulization every 6 (six) hours as needed (wheezing/shortness of breath).   Yes [provider]  ipratropium-albuterol (DUONEB) 0.5-2.5 (3) MG/3ML SOLN Take 3 mLs by nebulization 2 (two) times daily.   Yes [provider]  isosorbide mononitrate (IMDUR) 30 MG 24 hr tablet Take 1 tablet (30 mg total) by mouth daily. Patient taking differently: Take 60 mg by mouth daily.  08/05/16 12/24/25 Yes Bhagat, Bhavinkumar, PA  levothyroxine (SYNTHROID, LEVOTHROID) 100  MCG tablet Take 1 tablet (100 mcg total) by mouth daily before breakfast. 11/12/17  Yes Berton Mount I, MD  lidocaine (LIDODERM) 5 % Place 1 patch onto the skin daily. Apply to lower back as needed for pain.   Yes [provider]  linaclotide (LINZESS) 290 MCG CAPS capsule Take 290  mcg by mouth daily.    Yes [provider]  Menthol, Topical Analgesic, (BIOFREEZE) 4 % GEL Apply 1 application topically 2 (two) times daily.   Yes [provider]  metolazone (ZAROXOLYN) 2.5 MG tablet 1 pill every Wednesday as needed once a week for weight gain over 2.5 pounds, edema or shortness of breath. Patient taking differently: Take 2.5 mg by mouth See admin instructions. 1 pill every Wednesday as needed once a week for weight gain over 2.5 pounds, edema or shortness of breath. 12/08/17  Yes Leroy Sea, MD  metoprolol tartrate (LOPRESSOR) 25 MG tablet Take 0.5 tablets (12.5 mg total) by mouth 2 (two) times daily. 12/08/17  Yes Leroy Sea, MD  morphine 20 MG/5ML solution Take 10 mg by mouth every 2 (two) hours as needed for pain.   Yes [provider]  Multiple Vitamin (MULTIVITAMIN WITH MINERALS) TABS tablet Take 1 tablet by mouth daily.   Yes [provider]  mupirocin ointment (BACTROBAN) 2 % Place 1 application into the nose 2 (two) times daily. 11/12/17  Yes Barnetta Chapel, MD  nitroGLYCERIN (NITROSTAT) 0.4 MG SL tablet Place 0.4 mg under the tongue every 5 (five) minutes as needed for chest pain (MAX 3 TABLETS).    Yes [provider]  ondansetron (ZOFRAN) 4 MG tablet Take 4 mg by mouth every 8 (eight) hours as needed for nausea or vomiting.   Yes [provider]  polyethylene glycol (MIRALAX / GLYCOLAX) packet Take 17 g by mouth daily.   Yes [provider]  torsemide (DEMADEX) 20 MG tablet Take 40 mg by mouth daily.    Yes [provider]  warfarin (COUMADIN) 3 MG tablet Take 3 mg by mouth daily.   Yes [provider]  bisacodyl (DULCOLAX) 10 MG suppository Place 1 suppository (10 mg total) rectally daily as needed for moderate constipation. Patient not taking: Reported on 12/24/2017 12/08/17   Leroy Sea, MD  HYDROcodone-acetaminophen (NORCO/VICODIN) 5-325 MG tablet Take 1 tablet by  mouth every 4 (four) hours as needed for severe pain. Patient not taking: Reported on 12/24/2017 11/12/17   Berton Mount I, MD    The patient is critically ill with multiple organ systems failure and requires high complexity decision making for assessment and support, frequent evaluation and titration of therapies, application of advanced monitoring technologies and extensive interpretation of multiple databases.   Critical Care Time devoted to patient care services described in this note is  37  Minutes. This time reflects time of care of this signee Dr Koren Bound. This critical care time does not reflect procedure time, or teaching time or supervisory time of PA/NP/Med student/Med Resident etc but could involve care discussion time.  Alyson Reedy, M.D. [facility] Pulmonary/Critical Care Medicine. Pager: [phone]. After hours pager: [phone].  12/27/2017, 9:14 AM

## 2017-12-27 NOTE — Progress Notes (Signed)
ANTICOAGULATION CONSULT NOTE  Pharmacy Consult for heparin Indication: atrial fibrillation, TAVR   Patient Measurements: Height: 5\' 4"  (162.6 cm) Weight: 162 lb 0.6 oz (73.5 kg) IBW/kg (Calculated) : 54.7 Heparin Dosing Weight: 61.4  Vital Signs: Temp: 98.1 F (36.7 C) (09/08 0000) Temp Source: Oral (09/08 0000) BP: 127/46 (09/08 0200) Pulse Rate: 71 (09/08 0200)  Labs: Recent Labs    12/24/17 1023 12/24/17 1358 12/24/17 2055 12/25/17 0853  12/26/17 0623 12/26/17 0815 12/26/17 1614 12/27/17 0221  HGB 10.7*  --   --  9.7*  --  11.3*  --   --  8.8*  HCT 34.3*  --   --  30.2*  --  34.7*  --   --  28.1*  PLT 335  --   --  358  --  300  --   --  313  APTT 28  --   --   --   --   --   --   --   --   LABPROT 14.7  --   --   --   --   --   --   --   --   INR 1.16  --   --   --   --   --   --   --   --   HEPARINUNFRC  --   --   --   --    < >  --  0.41 0.25* 0.47  CREATININE 2.74*  --   --  3.45*  --  3.42*  --   --   --   TROPONINI 0.17* 0.20* 0.18*  --   --   --   --   --   --    < > = values in this interval not displayed.     Assessment: 84 yo female with pAF (on warfarin PTA) and TAVR (2014) presenting with respiratory distress and CHF exacerbation. Pharmacy has been consulted to start a heparin drip with subtherapeutic INR of 1.1 on admission. Heparin level now therapeutic   Goal of Therapy:  Heparin level 0.3-0.7 units/ml Monitor platelets by anticoagulation protocol: Yes    Plan:  Continue heparin at 950 units/hr  Check heparin level in 8 hours to confirm -Daily HL, CBC  Thanks for allowing pharmacy to be a part of this patient's care.  Talbert Cage, PharmD Clinical Pharmacist 12/27/2017 3:17 AM

## 2017-12-27 NOTE — Plan of Care (Signed)
  Problem: Nutrition: Goal: Adequate nutrition will be maintained Outcome: Progressing   

## 2017-12-27 NOTE — Progress Notes (Signed)
eLink Physician-Brief Progress Note Patient Name: [name] DOB: [birth year] MRN: [id number]   Date of Service  12/27/2017  HPI/Events of Note  Patient c/o pain - Requests home Norco. AST and ALT both normal.   eICU Interventions  Will order: 1. Norco 5 mg/ 325 mg PO Q 4 hours PRN pain.      Intervention Category Intermediate Interventions: Pain - evaluation and management  Sommer,Steven Eugene 12/27/2017, 11:54 PM

## 2017-12-27 NOTE — Progress Notes (Signed)
ANTICOAGULATION CONSULT NOTE  Pharmacy Consult for heparin Indication: atrial fibrillation, TAVR   Patient Measurements: Height: 5\' 4"  (162.6 cm) Weight: 163 lb 2.3 oz (74 kg) IBW/kg (Calculated) : 54.7 Heparin Dosing Weight: 61.4  Vital Signs: Temp: 98.2 F (36.8 C) (09/08 1414) Temp Source: Oral (09/08 1414) BP: 162/59 (09/08 1414) Pulse Rate: 89 (09/08 1414)  Labs: Recent Labs    12/24/17 2055  12/25/17 0853  12/26/17 0623  12/26/17 1614 12/27/17 0221 12/27/17 1340  HGB  --    < > 9.7*  --  11.3*  --   --  8.8* 9.4*  HCT  --    < > 30.2*  --  34.7*  --   --  28.1* 29.9*  PLT  --   --  358  --  300  --   --  313  --   HEPARINUNFRC  --   --   --    < >  --    < > 0.25* 0.47 0.54  CREATININE  --   --  3.45*  --  3.42*  --   --  3.63*  --   TROPONINI 0.18*  --   --   --   --   --   --   --   --    < > = values in this interval not displayed.     Assessment: 84 yo female with pAF (on warfarin PTA) and TAVR (2014) presenting with respiratory distress and CHF exacerbation. Pharmacy has been consulted to start a heparin drip with subtherapeutic INR of 1.1 on admission. Heparin level 0.54   Goal of Therapy:  Heparin level 0.3-0.7 units/ml Monitor platelets by anticoagulation protocol: Yes    Plan:  Continue heparin at 950 units/hr  -Daily HL, CBC    Dazaria Macneill A. Jeanella Craze, PharmD, BCPS Clinical Pharmacist [place] Pager: [phone] Please utilize Amion for appropriate phone number to reach the unit pharmacist [facility])   12/27/2017 2:34 PM

## 2017-12-27 NOTE — Progress Notes (Addendum)
Subjective: Interval History: Patient states she is not doing well today. Continues to have productive cough and weak voice. She is upset that she is in this situation and feels it is related to her last hospitalization and being discharged too soon. We discussed that from a kidney standpoint we will continue to monitor and see how she does with diuresing today.   Objective: Vital signs in last 24 hours: Temp:  [98.1 F (36.7 C)-99.6 F (37.6 C)] 98.2 F (36.8 C) (09/08 0400) Pulse Rate:  [66-169] 66 (09/08 0600) Resp:  [13-34] 19 (09/08 0600) BP: (93-173)/(39-136) 135/48 (09/08 0600) SpO2:  [84 %-100 %] 99 % (09/08 0600) FiO2 (%):  [40 %] 40 % (09/07 0805) Weight:  [74 kg] 74 kg (09/08 0500) Weight change: 0.5 kg  Intake/Output from previous day: 09/07 0701 - 09/08 0700 In: 1465 [I.V.:363.5; NG/GT:295; IV Piggyback:806.6] Out: 2950 [Urine:2950] Intake/Output this shift: Total I/O In: 213.9 [I.V.:213.9] Out: 650 [Urine:650]  General: Elderly female in slight distress Pulm: Rhonchi throughout, SOB with conversation, productive cough  CV: Irregular, unable to appreciate any murmurs Abdomen: Active bowel sounds, soft, non-distended, diffuse tenderness to palpation  Extremities: Mild LE edema   Lab Results: Recent Labs    12/26/17 0623 12/27/17 0221  WBC 14.6* 12.5*  HGB 11.3* 8.8*  HCT 34.7* 28.1*  PLT 300 313   BMET:  Recent Labs    12/26/17 0623 12/27/17 0221  NA 143 142  K 3.4* 3.2*  CL 105 104  CO2 23 24  GLUCOSE 137* 93  BUN 62* 68*  CREATININE 3.42* 3.63*  CALCIUM 9.5 8.8*   No results for input(s): PTH in the last 72 hours.   Iron Studies: No results for input(s): IRON, TIBC, TRANSFERRIN, FERRITIN in the last 72 hours.   Studies/Results: Dg Chest Port 1 View  Result Date: 12/26/2017 CLINICAL DATA:  History of ETT EXAM: PORTABLE CHEST 1 VIEW COMPARISON:  December 25, 2017 FINDINGS: The ETT is in good position. The NG tube terminates below today's film.  No pneumothorax. Small effusion with underlying opacity in left base is stable. The focal opacity in the medial right base persists but is less prominent the interval. Mild increased interstitial markings raise the possibility of mild edema/pulmonary venous congestion. No other change. IMPRESSION: 1. Support apparatus as above. 2. Improving infiltrate in medial right lung base. 3. Small left effusion with underlying opacity. 4. Suggested pulmonary venous congestion versus mild edema. Electronically Signed   By: Gerome Sam III M.D   On: 12/26/2017 07:26   [place] Ekg Site Rite  Result Date: 12/25/2017 If Site Rite image not attached, placement could not be confirmed due to current cardiac rhythm.  Scheduled: . Chlorhexidine Gluconate Cloth  6 each Topical Q0600  . docusate  100 mg Per Tube Daily  . furosemide  40 mg Intravenous BID  . insulin aspart  0-9 Units Subcutaneous Q4H  . ipratropium-albuterol  3 mL Nebulization Q6H  . levothyroxine  100 mcg Per Tube QAC breakfast  . mouth rinse  15 mL Mouth Rinse BID  . mupirocin ointment  1 application Nasal BID   Assessment/Plan:  AKI on CKD Stage IV. Likely hemodynamically mediated. Creatinine with slight increase to 3.63 from 3.4. Approaching euvolemia and CXR improved compared to admssion but still with diffuse reticular pattern. Net negative 1.5L over the past 24 hours. Cardiology has recommended IV furosemide 40 mg BID. UA on 9/7 with significant hematuria but no proteinuria. Went to lab and looked at patient's  urine. Red cells are not dysmorphic; therefore, low suspicion of GN. Plan to continue to monitor renal function. No indication for HD at this point.   LOS: 3 days   [facility] 12/27/2017,6:47 AM

## 2017-12-27 NOTE — Progress Notes (Signed)
eLink Physician-Brief Progress Note Patient Name: [name] DOB: 12-30-1932 MRN: [id number]   Date of Service  12/27/2017  HPI/Events of Note  K+ = 3.2 and Creatinine = 3.63.   eICU Interventions  Will not replace K+ due to renal failure.      Intervention Category Major Interventions: Electrolyte abnormality - evaluation and management  Sommer,Steven Eugene 12/27/2017, 6:20 AM

## 2017-12-27 NOTE — Progress Notes (Signed)
Progress Note  Patient Name: [name] Date of Encounter: 12/27/2017  Primary Cardiologist: Tonny Bollman, MD   Subjective   She feels like her SOB hasn't improved much, still coughing a lot.  Inpatient Medications    Scheduled Meds: . Chlorhexidine Gluconate Cloth  6 each Topical Q0600  . furosemide  40 mg Intravenous Q6H  . insulin aspart  0-9 Units Subcutaneous Q4H  . ipratropium-albuterol  3 mL Nebulization Q6H  . levothyroxine  100 mcg Per Tube QAC breakfast  . mouth rinse  15 mL Mouth Rinse BID  . mupirocin ointment  1 application Nasal BID  . potassium chloride  40 mEq Oral TID   Continuous Infusions: . sodium chloride    . ceFEPime (MAXIPIME) IV Stopped (12/27/17 0656)  . heparin 950 Units/hr (12/27/17 1000)   PRN Meds: sodium chloride, acetaminophen, albuterol, fentaNYL (SUBLIMAZE) injection, hydrALAZINE, metoprolol tartrate, ondansetron (ZOFRAN) IV   Vital Signs    Vitals:   12/27/17 0800 12/27/17 0801 12/27/17 0900 12/27/17 1000  BP: (!) 155/65  137/66   Pulse: 81  93 86  Resp: 14  16 (!) 30  Temp:      TempSrc:      SpO2: 100% 100% 100% 98%  Weight:      Height:        Intake/Output Summary (Last 24 hours) at 12/27/2017 1026 Last data filed at 12/27/2017 1000 Gross per 24 hour  Intake 837.65 ml  Output 2735 ml  Net -1897.35 ml   Filed Weights   12/25/17 0500 12/26/17 0433 12/27/17 0500  Weight: 73 kg 73.5 kg 74 kg    Telemetry    SR, PVCs - Personally Reviewed  Physical Exam  Agitated GEN: No acute distress.   Neck: No JVD Cardiac: RRR, no murmurs, rubs, or gallops.  Respiratory: decreased BS at bases bilaterally. GI: Soft, nontender, non-distended  MS: No edema; No deformity. Neuro:  Nonfocal  Psych: Normal affect   Labs    Chemistry Recent Labs  Lab 12/24/17 1023 12/25/17 0853 12/26/17 0623 12/27/17 0221  NA 142 142 143 142  K 3.2* 3.2* 3.4* 3.2*  CL 99 107 105 104  CO2 27 23 23 24   GLUCOSE 156* 109* 137* 93    BUN 70* 67* 62* 68*  CREATININE 2.74* 3.45* 3.42* 3.63*  CALCIUM 9.5 8.8* 9.5 8.8*  PROT 5.6*  --   --   --   ALBUMIN 3.0*  --   --   --   AST 33  --   --   --   ALT 16  --   --   --   ALKPHOS 66  --   --   --   BILITOT 1.0  --   --   --   GFRNONAA 15* 11* 11* 11*  GFRAA 17* 13* 13* 12*  ANIONGAP 16* 12 15 14      Hematology Recent Labs  Lab 12/25/17 0853 12/26/17 0623 12/27/17 0221  WBC 13.1* 14.6* 12.5*  RBC 3.11* 3.57* 2.84*  HGB 9.7* 11.3* 8.8*  HCT 30.2* 34.7* 28.1*  MCV 97.1 97.2 98.9  MCH 31.2 31.7 31.0  MCHC 32.1 32.6 31.3  RDW 15.5 15.4 15.6*  PLT 358 300 313    Cardiac Enzymes Recent Labs  Lab 12/24/17 1023 12/24/17 1358 12/24/17 2055  TROPONINI 0.17* 0.20* 0.18*   No results for input(s): TROPIPOC in the last 168 hours.   BNP Recent Labs  Lab 12/24/17 1358  BNP 779.1*  DDimer No results for input(s): DDIMER in the last 168 hours.   Radiology    Dg Chest Port 1 View  Result Date: 12/26/2017 CLINICAL DATA:  History of ETT EXAM: PORTABLE CHEST 1 VIEW COMPARISON:  December 25, 2017 FINDINGS: The ETT is in good position. The NG tube terminates below today's film. No pneumothorax. Small effusion with underlying opacity in left base is stable. The focal opacity in the medial right base persists but is less prominent the interval. Mild increased interstitial markings raise the possibility of mild edema/pulmonary venous congestion. No other change. IMPRESSION: 1. Support apparatus as above. 2. Improving infiltrate in medial right lung base. 3. Small left effusion with underlying opacity. 4. Suggested pulmonary venous congestion versus mild edema. Electronically Signed   By: Gerome Sam III M.D   On: 12/26/2017 07:26   [place] Ekg Site Rite  Result Date: 12/25/2017 If Site Rite image not attached, placement could not be confirmed due to current cardiac rhythm.  Cardiac Studies   TTE: 11/08/2017 Left ventricle: The cavity size was moderately dilated.  Wall thickness was increased in a pattern of mild LVH. Systolic function was severely reduced. The estimated ejection fraction was in the range of 25% to 30%. Diffuse hypokinesis, with akinesis of specific walls below. Features are consistent with a pseudonormal left ventricular filling pattern, with concomitant abnormal relaxation and increased filling pressure (grade 2 diastolic dysfunction). - Regional wall motion abnormality: Akinesis of the mid anteroseptal, basal-mid inferoseptal, and mid inferior myocardium; hypokinesis of the mid anterior, basal anteroseptal, apical septal, apical lateral, and apical myocardium. - Aortic valve: A prosthesis was present and functioning normally. The prosthesis had a normal range of motion. The sewing ring appeared normal, had no rocking motion, and showed no evidence of dehiscence.  There was mild regurgitation. Mean gradient (S): 11 mm Hg. Peak gradient (S): 18 mm Hg. Valve area (VTI): 1.91 cm^2. Valve area (Vmax): 1.73 cm^2. Valve area (Vmean): 1.71 cm^2. - Mitral valve: Mobility was restricted. There was mild regurgitation directed eccentrically. - Left atrium: The atrium was moderately dilated. - Atrial septum: No defect or patent foramen ovale was identified. - Tricuspid valve: There was mild regurgitation. - Pulmonic valve: There was mild regurgitation. - Pulmonary arteries: PA peak pressure: 32 mm Hg (S).  Impressions:  - LV function appears worse from prior. TAVR in place, with gradients slightly elevated from prior (peak 18, mean 11, AVA 1.91). Valvular AI also worse compared to prior    Patient Profile     84 y.o. female with a hx of NICM, chronic systolic and diastolic heart failure 9EF 25-30% with grade 2 diastolic dysfunction), morbid obesity, severe AS s/p TAVR 2014, paroxsymal atrial fibrillation on Coumadin, HTN, CKD stage IV, COPD with chronic respiratory failure on home O2, OSA,  bradycardia s/p pacemakerwho is being seen today for the evaluation of CHF at the request of Alyson Reedy, MD.  Assessment & Plan    1. Acute on chronic systolic heart failureEF 25% -Weight trending up despite significant diuresis, - Crea up to 3.6 from baseline 2.0-2.5, BUN 67, she is only mildly fluid overloaded on physical exam, I will repeat CXRg, I would hold lasix today and decrease to 40 mg iv BID starting tomorrow.  2. Acute respiratory failure - combination of CHF and chronic COPD O2 de, saturating 97%, but wheezing, I will give breathing treatment now  3.TAVR -Mild aortic regurgitation. Slightly worse hemodynamic gradients.Stable issue. Seems to be working properly.  4. Paroxysmal atrial  fibrillation -Carvedilol amiodarone Coumadin held.An episode of a-fib with RVR this am, now in SR, No changes made, stable, I would consider cardizem drip if a-fib with RVR recurs   5. CKD 4 -Worsening creatinine after diuresis.diuretics as above.  6. Troponin elevation  - demand ischemia, no ischemic workup  7. Anemia  - Hb down from 11-> 8.8 today, we will repeat, if low again, we will hold heparin drip  For questions or updates, please contact [facility] Please consult [email or web address] for contact info under     Signed, Tobias Alexander, MD  12/27/2017, 10:26 AM

## 2017-12-27 NOTE — Progress Notes (Signed)
Patient tolerated chest PT through bed for 4 minutes. Patient wanted it cut off because she says its hurts. Will try for longer next rounds.

## 2017-12-28 ENCOUNTER — Ambulatory Visit: Payer: Medicare Other | Admitting: Pulmonary Disease

## 2017-12-28 DIAGNOSIS — I248 Other forms of acute ischemic heart disease: Secondary | ICD-10-CM

## 2017-12-28 DIAGNOSIS — J181 Lobar pneumonia, unspecified organism: Secondary | ICD-10-CM

## 2017-12-28 DIAGNOSIS — J9621 Acute and chronic respiratory failure with hypoxia: Principal | ICD-10-CM

## 2017-12-28 LAB — BASIC METABOLIC PANEL
Anion gap: 14 (ref 5–15)
BUN: 62 mg/dL — AB (ref 8–23)
CO2: 26 mmol/L (ref 22–32)
CREATININE: 3.95 mg/dL — AB (ref 0.44–1.00)
Calcium: 9.1 mg/dL (ref 8.9–10.3)
Chloride: 102 mmol/L (ref 98–111)
GFR calc Af Amer: 11 mL/min — ABNORMAL LOW (ref >60)
GFR, EST NON AFRICAN AMERICAN: 10 mL/min — AB (ref >60)
Glucose, Bld: 124 mg/dL — ABNORMAL HIGH (ref 70–99)
Potassium: 3.7 mmol/L (ref 3.5–5.1)
SODIUM: 142 mmol/L (ref 135–145)

## 2017-12-28 LAB — LEGIONELLA PNEUMOPHILA SEROGP 1 UR AG: L. pneumophila Serogp 1 Ur Ag: NEGATIVE

## 2017-12-28 LAB — GLUCOSE, CAPILLARY
Glucose-Capillary: 101 mg/dL — ABNORMAL HIGH (ref 70–99)
Glucose-Capillary: 91 mg/dL (ref 70–99)
Glucose-Capillary: 93 mg/dL (ref 70–99)
Glucose-Capillary: 99 mg/dL (ref 70–99)

## 2017-12-28 LAB — CBC
HCT: 28.5 % — ABNORMAL LOW (ref 36.0–46.0)
Hemoglobin: 8.9 g/dL — ABNORMAL LOW (ref 12.0–15.0)
MCH: 31 pg (ref 26.0–34.0)
MCHC: 31.2 g/dL (ref 30.0–36.0)
MCV: 99.3 fL (ref 78.0–100.0)
PLATELETS: 306 10*3/uL (ref 150–400)
RBC: 2.87 MIL/uL — ABNORMAL LOW (ref 3.87–5.11)
RDW: 15.7 % — AB (ref 11.5–15.5)
WBC: 10.1 10*3/uL (ref 4.0–10.5)

## 2017-12-28 LAB — MAGNESIUM: MAGNESIUM: 2 mg/dL (ref 1.7–2.4)

## 2017-12-28 LAB — HEPARIN LEVEL (UNFRACTIONATED): Heparin Unfractionated: 0.39 IU/mL (ref 0.30–0.70)

## 2017-12-28 LAB — PHOSPHORUS: Phosphorus: 4.7 mg/dL — ABNORMAL HIGH (ref 2.5–4.6)

## 2017-12-28 MED ORDER — INSULIN ASPART 100 UNIT/ML ~~LOC~~ SOLN
0.0000 [IU] | Freq: Three times a day (TID) | SUBCUTANEOUS | Status: DC
  Administered 2018-01-01: 1 [IU] via SUBCUTANEOUS
  Administered 2018-01-02: 2 [IU] via SUBCUTANEOUS

## 2017-12-28 NOTE — Clinical Social Work Note (Signed)
Clinical Social Work Assessment  Patient Details  Name: [name] MRN: [id number] Date of Birth: Aug 29, 1932  Date of referral:  12/28/17               Reason for consult:  Facility Placement                Permission sought to share information with:  Facility Sport and exercise psychologist, Family Supports Permission granted to share information::  Yes, Verbal Permission Granted  Name::     Darla Lesches::  SNF ([facility])  Relationship::  son   Contact Information:  [phone]  Housing/Transportation Living arrangements for the past 2 months:  [place] of Information:  Patient Patient Interpreter Needed:  None Criminal Activity/Legal Involvement Pertinent to Current Situation/Hospitalization:  No - Comment as needed Significant Relationships:  Adult Children Lives with:  Facility Resident Do you feel safe going back to the place where you live?  Yes Need for family participation in patient care:  No (Coment)  Care giving concerns:  Spoke w/patient regarding possibility of returning to previous SNF placement at [facility]. CSW received referral for possible SNF placement at time of discharge. CSW met with patient regarding PT recommendation of SNF placement at time of discharge. Per patient, she expressed understanding of PT recommendation and is agreeable to SNF placement at time of discharge. CSW to continue to follow and assist with discharge planning needs.   Social Worker assessment / plan:  Spoke with patient concerning rehab at SNF.   Employment status:  Retired Nurse, adult PT Recommendations:  [place] / Referral to community resources:  [place]  Patient/Family's Response to care:  Patient recognizes need for rehab and is agreeable to SNF at [facility] where she has been living. CSW explained insurance authorization process.   Patient/Family's Understanding of and Emotional Response to  Diagnosis, Current Treatment, and Prognosis:  Patient is realistic regarding therapy needs. Patient expressed understanding of CSW role and discharge process as well as medical condition. No questions/concerns about plan or treatment.   Emotional Assessment Appearance:  Appears stated age Attitude/Demeanor/Rapport:  Gracious, Engaged Affect (typically observed):  Accepting, Adaptable Orientation:  Oriented to Self, Oriented to Place, Oriented to Situation, Oriented to  Time Alcohol / Substance use:  Not Applicable Psych involvement (Current and /or in the community):  No (Comment)  Discharge Needs  Concerns to be addressed:  Care Coordination, Discharge Planning Concerns Readmission within the last 30 days:  No Current discharge risk:  Dependent with Mobility Barriers to Discharge:  Continued Medical Work up   [employer], LCSW 12/28/2017, 12:17 PM

## 2017-12-28 NOTE — Progress Notes (Signed)
Subjective: [name] was seen sleeping in her bed this morning. She states that she is having difficulty breathing on 4L oxygen. She was also coughing profusely and required suction.  Objective: Vital signs in last 24 hours: Temp:  [98 F (36.7 C)-98.8 F (37.1 C)] 98 F (36.7 C) (09/09 0507) Pulse Rate:  [75-97] 97 (09/09 0507) Resp:  [16-30] 24 (09/09 0507) BP: (136-162)/(48-67) 136/56 (09/09 0507) SpO2:  [91 %-100 %] 97 % (09/09 0802) Weight:  [75.3 kg] 75.3 kg (09/09 0507) Weight change: 1.3 kg  Intake/Output from previous day: 09/08 0701 - 09/09 0700 In: 195.7 [I.V.:195.7] Out: 2210 [Urine:2210] Intake/Output this shift: No intake/output data recorded.  Physical Exam  Constitutional: She appears well-developed and well-nourished. She appears ill. She appears distressed.  HENT:  Head: Normocephalic and atraumatic.  Eyes: Conjunctivae are normal.  Cardiovascular: Normal rate, regular rhythm and normal heart sounds.  Respiratory: She is in respiratory distress. She has wheezes.  accessory respiratory muscle use  GI: Soft. Bowel sounds are normal. She exhibits no distension. There is no tenderness.  Musculoskeletal: She exhibits no edema.  Neurological: She is alert.  Skin: She is not diaphoretic.  Psychiatric: Her behavior is normal. Thought content normal. Her mood appears anxious. Her speech is rapid and/or pressured. Cognition and memory are normal.   Lab Results: Recent Labs    12/26/17 0623 12/27/17 0221 12/27/17 1340  WBC 14.6* 12.5*  --   HGB 11.3* 8.8* 9.4*  HCT 34.7* 28.1* 29.9*  PLT 300 313  --    BMET:  Recent Labs    12/26/17 0623 12/27/17 0221  NA 143 142  K 3.4* 3.2*  CL 105 104  CO2 23 24  GLUCOSE 137* 93  BUN 62* 68*  CREATININE 3.42* 3.63*  CALCIUM 9.5 8.8*   No results for input(s): PTH in the last 72 hours. Iron Studies: No results for input(s): IRON, TIBC, TRANSFERRIN, FERRITIN in the last 72 hours. Studies/Results: Dg Chest 2  View  Result Date: 12/27/2017 CLINICAL DATA:  Follow-up congestive heart failure. EXAM: CHEST - 2 VIEW COMPARISON:  12/26/2017 FINDINGS: Endotracheal tube and nasogastric tube have been removed since prior study. Pacemaker remains in appropriate position. Transcatheter aortic valve replacement again noted. Aortic atherosclerosis. Stable mild cardiomegaly. Decreased pulmonary interstitial edema. Persistent small left pleural effusion and left basilar atelectasis. IMPRESSION: Decreased pulmonary interstitial edema. Stable small left pleural effusion and left basilar atelectasis. Electronically Signed   By: Myles Rosenthal M.D.   On: 12/27/2017 14:37    I have reviewed the patient's current medications.  Assessment/Plan:  Acute on chronic kidney injury (Stage IV baseline cr 2.0-2.5) The patient's acute worsening of kidney function is thought to be hemodynamically mediated and potentially worsened with recent diuresis. Patient does not have morning labs to assess kidney function today 9/9. She has put out 2.2L of urine over the past 24 hrs after being given lasix 40mg  three times yesterday. Her weight is still up 3lbs (166lbs from 163lbs). Her blood pressure has ranged 130-140/40-50s.  -Cardiology to continue lasix at decreased dose of 40mg  bid today 9/9 -Patient is getting vein mapping today 9/9 today for possible dialysis at a later time.   Acute respiratory failure Secondary to HFrEF and COPD. Patient is currently on 4L [place].      LOS: 4 days   [name] 12/28/2017,8:05 AM

## 2017-12-28 NOTE — Progress Notes (Signed)
PROGRESS NOTE  [name] ZOX:[id number] DOB: May 09, 1932 DOA: 12/24/2017 PCP: Jodi Grippe, MD  Brief Narrative: 84 year old woman complicated PMH including oxygen dependent COPD, systolic CHF, paroxysmal atrial fibrillation, CKD, pulmonary embolism, presented with acute shortness of breath, failed BiPAP after vomiting he was intubated in the emergency department and admitted by critical care.  She was a treated for acute pulmonary edema, acute CHF, COPD and was subsequently extubated, also treated for suspected bilateral pneumonia.  Renal function continued to worsen and at this point dialysis may be needed in the future.  Assessment/Plan Acute on chronic systolic/diastolic CHF --Continue management per cardiology.  Lasix currently on hold.  Acute on chronic hypoxic respiratory failure, combination of CHF, oxygen dependent COPD, bilateral multilobar pneumonia --Oxygenation appears stable on 3 L.  Continue cefepime.  AKI on CKD stage IV thought to be hemodynamically mediated in the setting of CHF and poor renal perfusion.  Nonoliguric. --Creatinine rising, nephrology holding Lasix.  Vein mapping ordered.  Demand ischemia, no further ischemic work-up per cardiology.  DM type 2 --Appears stable.  Continue sliding scale insulin  Severe aortic stenosis status post TAVR 2014 --Felt to be working properly per cardiology  PAF on warfarin, status post dual-chamber pacemaker --Heparin, metoprolol per cardiology  Hypothyroidism. --Levothyroxine was adjusted to 100 mcg since admission  Dementia  Aortic atherosclerosis   Patient with worsening renal function.  Continue management per cardiology and nephrology.  Will consult palliative medicine as per nephrology.  DVT prophylaxis: heparin infusion Code Status: partial Family Communication: none Disposition Plan: pending    Brendia Sacks, MD  [facility] Direct contact: [phone] --Via amion app OR  --[email or web address];  password [email or web address]  7PM-7AM contact night coverage as above 12/28/2017, 6:34 PM  LOS: 4 days   Consultants:  Cardiology  PCCM  Nephrology   Procedures:  ETT  Antimicrobials:  Cefepime   Interval history/Subjective: Chronic SOB. Feels ok. Lots of phlegm. Some nausea. Tolerating liquids.  Objective: Vitals:  Vitals:   12/28/17 1404 12/28/17 1447  BP:  (!) 123/53  Pulse:  (!) 118  Resp:  19  Temp:  99.3 F (37.4 C)  SpO2: 98% 100%    Exam:  Constitutional:  . Appears calm, mildly uncomfortable, chronically ill Eyes:  . pupils and irises appear normal . Normal lids  ENMT:  . Mildly hard of hearing . Lips appear normal Respiratory:  . CTA bilaterally, no w/r/r.  . Moderate increased resp effort, becomes immediately SOB when talking Cardiovascular:  . RRR, no m/r/g . No LE extremity edema   . DP pulses 2+ bilateral LE Abdomen:  . Soft, ntnd Skin:  . Feet, lower legs appear normal; no rash, swelling, lesions. Psychiatric:  . Mental status o Mood, affect appropriate  I have personally reviewed the following:   Data: . CBG stable . Creatinine rising, 3.95.  Anion gap within normal limits.  Potassium within normal limits.  Hemoglobin stable 8.9.  Remainder CBC unremarkable.  Scheduled Meds: . Chlorhexidine Gluconate Cloth  6 each Topical Q0600  . [START ON 12/29/2017] insulin aspart  0-9 Units Subcutaneous TID WC  . ipratropium-albuterol  3 mL Nebulization Q6H  . levothyroxine  100 mcg Per Tube QAC breakfast  . mouth rinse  15 mL Mouth Rinse BID  . mupirocin ointment  1 application Nasal BID   Continuous Infusions: . sodium chloride    . ceFEPime (MAXIPIME) IV 1 g (12/28/17 0517)  . heparin 950 Units/hr (12/27/17 1712)    Active Problems:   Acute  on chronic combined systolic and diastolic CHF (congestive heart failure) (HCC)   Hypothyroidism   CKD (chronic kidney disease) stage 4, GFR 15-29 ml/min (HCC)   Acute on chronic respiratory failure with  hypoxia (HCC)   Acute renal failure with acute tubular necrosis superimposed on stage 4 chronic kidney disease (HCC)   Lobar pneumonia (HCC)   Demand ischemia (HCC)   LOS: 4 days   Time spent in chart review and care 40 minutes

## 2017-12-28 NOTE — Progress Notes (Signed)
ANTICOAGULATION CONSULT NOTE -   Pharmacy Consult for Heparin and Cefepime Indication: afib and PNA  Allergies  Allergen Reactions  . Nubain [Nalbuphine Hcl] Hives    Went into cardiac arrest   . Codeine Hives and Nausea Only    Has tolerated Oxycodone  . Darvocet [Propoxyphene N-Acetaminophen] Nausea Only    Patient Measurements: Height: 5\' 4"  (162.6 cm) Weight: 166 lb 0.1 oz (75.3 kg) IBW/kg (Calculated) : 54.7 Heparin Dosing Weight:    Vital Signs: Temp: 98 F (36.7 C) (09/09 0507) Temp Source: Oral (09/09 0507) BP: 136/56 (09/09 0507) Pulse Rate: 97 (09/09 0507)  Labs: Recent Labs    12/26/17 0623  12/27/17 0221 12/27/17 1340 12/28/17 0947  HGB 11.3*  --  8.8* 9.4* 8.9*  HCT 34.7*  --  28.1* 29.9* 28.5*  PLT 300  --  313  --  306  HEPARINUNFRC  --    < > 0.47 0.54 0.39  CREATININE 3.42*  --  3.63*  --  3.95*   < > = values in this interval not displayed.    Estimated Creatinine Clearance: 10.7 mL/min (A) (by C-G formula based on SCr of 3.95 mg/dL (H)).   Medical History: Past Medical History:  Diagnosis Date  . Acute on chronic renal failure [facility])    sees Dr Allena Katz   . Anemia    Acute blood loss  . Aortic regurgitation   . Aortic stenosis 10/13/2012   Low EF, low gradient with severe aortic stenosis confirmed by dobutamine stress echocardiogram s/p TAVR 12/2012  . Asthma   . Atrial fibrillation (HCC)    tachy-brady syndrome with <1% recurrent PAF since pacemaker placement  . Cataracts, bilateral   . Chronic combined systolic and diastolic CHF (congestive heart failure) (HCC)   . Chronic lower back pain   . CKD (chronic kidney disease)   . Coronary artery disease involving native coronary artery of native heart   . Dementia    Without behavioral disturbance  . Dysrhythmia   . Fibromyalgia   . Gastroesophageal reflux disease   . H/O dizziness   . H/O urinary frequency   . H/O: stroke   . Hard of hearing   . Headache    hx of migraines   . Heart  murmur   . History of blood transfusion   . History of bronchitis   . History of kidney stones   . History of urinary tract infection   . HLD (hyperlipidemia)   . HTN (hypertension)   . Hypothyroidism   . Neuropathy   . Nonischemic cardiomyopathy (HCC)   . On home oxygen therapy    patient uses at nite- 2L- has not used in > 6 months per patient  . Osteoarthritis   . Osteoporosis   . Pneumonia    hx of x 3   . PONV (postoperative nausea and vomiting)   . Presence of permanent cardiac pacemaker   . Pulmonary embolism (HCC)    HISTORY OF, the pt. had a recurrent bilateral pulmonary emboli in 2005, on warfarin therapy and at which time she under went implantation of IVC filter  . Repeated falls   . Rupture of right patellar tendon   . Sciatica   . Shortness of breath dyspnea    with exertion   . Sleep apnea    uses oxygen at night and PRN- not used since > 6 months / DOES NOT USE  C-PAP  . Spinal stenosis   . Stroke (HCC)   .  Symptomatic bradycardia   . Symptomatic bradycardia 2012   s/p [employer] PPM  . Syncope   . Urinary incontinence   . Urinary urgency     Assessment:  Anticoag: Warf pta for PAF (on hold),  noted hx PE in '05 s/p IVC filter + hx AVR. HL 0.39, Hgb 8.9. Plts 306 stable.  ID: sepsis - PNA vs. CHF exacerbation. LA 6.9>2.8, wbc 12.5>10.1, febrile. Scr up. Lots of coughing - CXR and CT chest suggest bilateral multilobar PNA  9/5 cefepime>> 9/5 vanc>> 9/7 9/5 azithro>> 9/7  9/5 blood cx: ngtd 9/5: BC x 2>> 9/5 resp cx: sent 9/5 urine cx: cancelled 9/5 MRSA PCR: negative   Goal of Therapy:  Heparin level 0.3-0.7 units/ml Monitor platelets by anticoagulation protocol: Yes   Plan:  Heparin  900 units/hr. Daily HL, CBC F/u resuming warfarin when able Cefepime 1 g q24 dose ok for renal function F/u to resume home meds  Brynne Doane S. Merilynn Finland, PharmD, BCPS Clinical Staff Pharmacist [phone]  Misty Stanley Stillinger 12/28/2017,11:31 AM

## 2017-12-28 NOTE — Progress Notes (Signed)
Pt was coughing and feeling like she had to throw up.  Neb treatment stopped patient felt that she was unable to complete it.  RN is aware of patient having sickness on stomach .Marland KitchenMarland Kitchen

## 2017-12-28 NOTE — NC FL2 (Signed)
[place] MEDICAID FL2 LEVEL OF CARE SCREENING TOOL     IDENTIFICATION  Patient Name: [name] Birthdate: Dec 20, 1932 Sex: female Admission Date (Current Location): 12/24/2017  [facility] and [place] Number:  Producer, television/film/video and Address:  [facility] [place], [address], [place], [place] [address]      Provider Number: [id number]  Attending Physician Name and Address:  Standley Brooking, MD  Relative Name and Phone Number:  Onalee Hua [phone]    Current Level of Care: Hospital Recommended Level of Care: Skilled Nursing Facility Prior Approval Number:    Date Approved/Denied:   PASRR Number: [id number]  Discharge Plan: SNF    Current Diagnoses: Patient Active Problem List   Diagnosis Date Noted  . Lactic acidosis   . Acute renal failure with acute tubular necrosis superimposed on stage 4 chronic kidney disease (HCC)   . Pneumonia due to infectious organism 12/18/2017  . Acute on chronic congestive heart failure (HCC)   . Elevated brain natriuretic peptide (BNP) level   . SOB (shortness of breath)   . Acute on chronic combined systolic and diastolic congestive heart failure (HCC) 12/04/2017  . Elevated troponin 11/07/2017  . Acute kidney injury superimposed on chronic kidney disease (HCC) 11/07/2017  . Anticoagulation management encounter 09/29/2016  . Respiratory failure (HCC) 07/21/2016  . Acute on chronic respiratory failure with hypoxia (HCC) 07/21/2016  . Head contusion 07/15/2016  . Fall 07/14/2016  . Chest pain at rest   . Acute on chronic systolic congestive heart failure (HCC)   . Unstable angina (HCC)   . Acute respiratory failure (HCC) 06/13/2016  . Elevated blood sugar 06/13/2016  . Dementia without behavioral disturbance 01/09/2016  . Chronic constipation 01/09/2016  . Neuropathic pain 01/09/2016  . Esophageal reflux 01/09/2016  . Hypothyroidism 01/09/2016  . CKD (chronic kidney disease) stage 4, GFR 15-29 ml/min (HCC)  01/09/2016  . Right patellar tendon rupture 06/25/2015  . Avulsion of right patellar tendon 03/08/2015  . Aspiration pneumonia (HCC) 12/14/2014  . Anemia of chronic disease 12/14/2014  . Aspiration into airway 12/14/2014  . Swallowing dysfunction 12/14/2014  . S/P right TKA 12/04/2014  . S/P knee replacement 12/04/2014  . Acute blood loss anemia 12/28/2012  . Thrombocytopenia (HCC) 12/28/2012  . Toe fracture 12/28/2012  . CKD (chronic kidney disease), stage III (HCC) 12/28/2012  . Low back pain 12/28/2012  . Wound dehiscence, surgical 12/28/2012  . Atelectasis 12/28/2012  . S/P TAVR (transcatheter aortic valve replacement) 12/21/2012  . Severe aortic stenosis 12/06/2012  . Aortic stenosis 10/13/2012  . Acute on chronic combined systolic and diastolic CHF (congestive heart failure) (HCC) 10/13/2012  . Pacemaker 10/15/2010  . Paroxysmal atrial fibrillation (HCC) 10/15/2010  . Dyslipidemia 10/15/2010  . Hypertension 10/15/2010  . Acute on chronic diastolic heart failure (HCC) 10/15/2010    Orientation RESPIRATION BLADDER Height & Weight     Self, Situation, Place  Other (Comment)(oral suction) Incontinent Weight: 166 lb 0.1 oz (75.3 kg) Height:  5\' 4"  (162.6 cm)  BEHAVIORAL SYMPTOMS/MOOD NEUROLOGICAL BOWEL NUTRITION STATUS      Continent Diet  AMBULATORY STATUS COMMUNICATION OF NEEDS Skin   Limited Assist Verbally Other (Comment)(Ecchymosis both arms)                       Personal Care Assistance Level of Assistance  Bathing, Feeding, Dressing, Total care Bathing Assistance: Limited assistance Feeding assistance: Limited assistance Dressing Assistance: Limited assistance Total Care Assistance: Limited assistance  Functional Limitations Info  Sight, Hearing, Speech Sight Info: Adequate Hearing Info: Adequate Speech Info: Adequate    SPECIAL CARE FACTORS FREQUENCY  OT (By licensed OT), PT (By licensed PT)     PT Frequency: 2x weekly OT Frequency: 2x weekly             Contractures Contractures Info: Not present    Additional Factors Info  Code Status, Allergies, Isolation Precautions Code Status Info: Partial Code Allergies Info: Allergies:  Nubain Nalbuphine Hcl, Codeine, Darvocet Propoxyphene N-acetaminophen     Isolation Precautions Info: MRSA by PCR     Current Medications (12/28/2017):  This is the current hospital active medication list Current Facility-Administered Medications  Medication Dose Route Frequency Provider Last Rate Last Dose  . 0.9 %  sodium chloride infusion  250 mL Intravenous PRN Minor, Vilinda Blanks, NP      . albuterol (PROVENTIL) (2.5 MG/3ML) 0.083% nebulizer solution 2.5 mg  2.5 mg Nebulization Q2H PRN Alyson Reedy, MD   2.5 mg at 12/28/17 0515  . ceFEPIme (MAXIPIME) 1 g in sodium chloride 0.9 % 100 mL IVPB  1 g Intravenous Q24H Rhunette Croft, Ankit, MD 200 mL/hr at 12/28/17 0517 1 g at 12/28/17 0517  . Chlorhexidine Gluconate Cloth 2 % PADS 6 each  6 each Topical Q0600 Alyson Reedy, MD   6 each at 12/28/17 0520  . heparin ADULT infusion 100 units/mL (25000 units/240mL sodium chloride 0.45%)  950 Units/hr Intravenous Continuous Pierce, Dwayne A, RPH 9.5 mL/hr at 12/27/17 1712 950 Units/hr at 12/27/17 1712  . hydrALAZINE (APRESOLINE) injection 10 mg  10 mg Intravenous Q4H PRN Migdalia Dk, MD   10 mg at 12/26/17 0408  . HYDROcodone-acetaminophen (NORCO/VICODIN) 5-325 MG per tablet 1 tablet  1 tablet Oral Q4H PRN Karl Ito, MD   1 tablet at 12/28/17 [id number]  . insulin aspart (novoLOG) injection 0-9 Units  0-9 Units Subcutaneous Q4H Minor, Vilinda Blanks, NP   1 Units at 12/26/17 0849  . ipratropium-albuterol (DUONEB) 0.5-2.5 (3) MG/3ML nebulizer solution 3 mL  3 mL Nebulization Q6H Minor, Vilinda Blanks, NP   3 mL at 12/28/17 0801  . levothyroxine (SYNTHROID, LEVOTHROID) tablet 100 mcg  100 mcg Per Tube QAC breakfast Lawerance Bach, RPH   100 mcg at 12/28/17 5747  . MEDLINE mouth rinse  15 mL Mouth Rinse BID Alyson Reedy, MD   15 mL at 12/28/17 1005  . metoprolol tartrate (LOPRESSOR) injection 2.5 mg  2.5 mg Intravenous Q6H PRN Migdalia Dk, MD   2.5 mg at 12/26/17 0534  . mupirocin ointment (BACTROBAN) 2 % 1 application  1 application Nasal BID Alyson Reedy, MD   1 application at 12/28/17 1005  . ondansetron (ZOFRAN) injection 4 mg  4 mg Intravenous Q6H PRN Tobey Grim, NP   4 mg at 12/28/17 1258     Discharge Medications: Please see discharge summary for a list of discharge medications.  Relevant Imaging Results:  Relevant Lab Results:   Additional Information SSN [id number]  Gildardo Griffes, LCSW

## 2017-12-28 NOTE — Progress Notes (Signed)
Progress Note  Patient Name: [name] Date of Encounter: 12/28/2017  Primary Cardiologist: Tonny Bollman, MD   Subjective   She states that she feels better today, she was transferred to telemetry floor.  Inpatient Medications    Scheduled Meds: . Chlorhexidine Gluconate Cloth  6 each Topical Q0600  . insulin aspart  0-9 Units Subcutaneous Q4H  . ipratropium-albuterol  3 mL Nebulization Q6H  . levothyroxine  100 mcg Per Tube QAC breakfast  . mouth rinse  15 mL Mouth Rinse BID  . mupirocin ointment  1 application Nasal BID   Continuous Infusions: . sodium chloride    . ceFEPime (MAXIPIME) IV 1 g (12/28/17 0517)  . heparin 950 Units/hr (12/27/17 1712)   PRN Meds: sodium chloride, albuterol, hydrALAZINE, HYDROcodone-acetaminophen, metoprolol tartrate, ondansetron (ZOFRAN) IV   Vital Signs    Vitals:   12/27/17 2132 12/28/17 0254 12/28/17 0507 12/28/17 0802  BP:   (!) 136/56   Pulse: 82 80 97   Resp: 16 16 (!) 24   Temp:   98 F (36.7 C)   TempSrc:   Oral   SpO2: 98% 98% 92% 97%  Weight:   75.3 kg   Height:        Intake/Output Summary (Last 24 hours) at 12/28/2017 1107 Last data filed at 12/28/2017 1049 Gross per 24 hour  Intake 167.85 ml  Output 2150 ml  Net -1982.15 ml   Filed Weights   12/26/17 0433 12/27/17 0500 12/28/17 0507  Weight: 73.5 kg 74 kg 75.3 kg    Telemetry    SR, PVCs - Personally Reviewed  Physical Exam  Agitated GEN: No acute distress.   Neck: No JVD Cardiac: RRR, 2/6 diastolic murmurs, rubs, or gallops.  Respiratory: decreased BS at bases bilaterally. GI: Soft, nontender, non-distended  MS: No edema; No deformity. Neuro:  Nonfocal  Psych: Normal affect   Labs    Chemistry Recent Labs  Lab 12/24/17 1023  12/26/17 0623 12/27/17 0221 12/28/17 0947  NA 142   < > 143 142 142  K 3.2*   < > 3.4* 3.2* 3.7  CL 99   < > 105 104 102  CO2 27   < > 23 24 26   GLUCOSE 156*   < > 137* 93 124*  BUN 70*   < > 62* 68* 62*    CREATININE 2.74*   < > 3.42* 3.63* 3.95*  CALCIUM 9.5   < > 9.5 8.8* 9.1  PROT 5.6*  --   --   --   --   ALBUMIN 3.0*  --   --   --   --   AST 33  --   --   --   --   ALT 16  --   --   --   --   ALKPHOS 66  --   --   --   --   BILITOT 1.0  --   --   --   --   GFRNONAA 15*   < > 11* 11* 10*  GFRAA 17*   < > 13* 12* 11*  ANIONGAP 16*   < > 15 14 14    < > = values in this interval not displayed.     Hematology Recent Labs  Lab 12/26/17 0623 12/27/17 0221 12/27/17 1340 12/28/17 0947  WBC 14.6* 12.5*  --  10.1  RBC 3.57* 2.84*  --  2.87*  HGB 11.3* 8.8* 9.4* 8.9*  HCT 34.7* 28.1* 29.9* 28.5*  MCV 97.2 98.9  --  99.3  MCH 31.7 31.0  --  31.0  MCHC 32.6 31.3  --  31.2  RDW 15.4 15.6*  --  15.7*  PLT 300 313  --  306    Cardiac Enzymes Recent Labs  Lab 12/24/17 1023 12/24/17 1358 12/24/17 2055  TROPONINI 0.17* 0.20* 0.18*   No results for input(s): TROPIPOC in the last 168 hours.   BNP Recent Labs  Lab 12/24/17 1358  BNP 779.1*    DDimer No results for input(s): DDIMER in the last 168 hours.   Radiology    Dg Chest 2 View  Result Date: 12/27/2017 CLINICAL DATA:  Follow-up congestive heart failure. EXAM: CHEST - 2 VIEW COMPARISON:  12/26/2017 FINDINGS: Endotracheal tube and nasogastric tube have been removed since prior study. Pacemaker remains in appropriate position. Transcatheter aortic valve replacement again noted. Aortic atherosclerosis. Stable mild cardiomegaly. Decreased pulmonary interstitial edema. Persistent small left pleural effusion and left basilar atelectasis. IMPRESSION: Decreased pulmonary interstitial edema. Stable small left pleural effusion and left basilar atelectasis. Electronically Signed   By: Myles Rosenthal M.D.   On: 12/27/2017 14:37   Cardiac Studies   TTE: 11/08/2017 Left ventricle: The cavity size was moderately dilated. Wall thickness was increased in a pattern of mild LVH. Systolic function was severely reduced. The estimated  ejection fraction was in the range of 25% to 30%. Diffuse hypokinesis, with akinesis of specific walls below. Features are consistent with a pseudonormal left ventricular filling pattern, with concomitant abnormal relaxation and increased filling pressure (grade 2 diastolic dysfunction). - Regional wall motion abnormality: Akinesis of the mid anteroseptal, basal-mid inferoseptal, and mid inferior myocardium; hypokinesis of the mid anterior, basal anteroseptal, apical septal, apical lateral, and apical myocardium. - Aortic valve: A prosthesis was present and functioning normally. The prosthesis had a normal range of motion. The sewing ring appeared normal, had no rocking motion, and showed no evidence of dehiscence.  There was mild regurgitation. Mean gradient (S): 11 mm Hg. Peak gradient (S): 18 mm Hg. Valve area (VTI): 1.91 cm^2. Valve area (Vmax): 1.73 cm^2. Valve area (Vmean): 1.71 cm^2. - Mitral valve: Mobility was restricted. There was mild regurgitation directed eccentrically. - Left atrium: The atrium was moderately dilated. - Atrial septum: No defect or patent foramen ovale was identified. - Tricuspid valve: There was mild regurgitation. - Pulmonic valve: There was mild regurgitation. - Pulmonary arteries: PA peak pressure: 32 mm Hg (S).  Impressions:  - LV function appears worse from prior. TAVR in place, with gradients slightly elevated from prior (peak 18, mean 11, AVA 1.91). Valvular AI also worse compared to prior    Patient Profile     84 y.o. female with a hx of NICM, chronic systolic and diastolic heart failure 9EF 25-30% with grade 2 diastolic dysfunction), morbid obesity, severe AS s/p TAVR 2014, paroxsymal atrial fibrillation on Coumadin, HTN, CKD stage IV, COPD with chronic respiratory failure on home O2, OSA, bradycardia s/p pacemakerwho is being seen today for the evaluation of CHF at the request of Alyson Reedy,  MD.  Assessment & Plan    1. Acute on chronic systolic heart failureEF 25% -Weight trending up despite significant diuresis, - Crea up to 3.9 from baseline 2.0-2.5, BUN 67, she appears euvolemic, I would hold lasix for now. - clinically she is improving.  2. Acute respiratory failure - combination of CHF and chronic COPD O2 de, saturating 97%, resolved breathing  3.TAVR -Mild aortic regurgitation. Slightly worse hemodynamic gradients.Stable issue.  Seems to be working properly.  4. Paroxysmal atrial fibrillation -Carvedilol amiodarone Coumadin held.An episode of a-fib with RVR this am, now in SR, No changes made, stable, I would consider cardizem drip if a-fib with RVR recurs   5. CKD 4 -Worsening creatinine after diuresis.diuretics as above.  6. Troponin elevation  - demand ischemia, no ischemic workup  7. Anemia  - Hb down from 11-> 8.8 today, we will repeat, if low again, we will hold heparin drip  For questions or updates, please contact [facility] Please consult [email or web address] for contact info under     Signed, Tobias Alexander, MD  12/28/2017, 11:07 AM

## 2017-12-29 LAB — RENAL FUNCTION PANEL
ALBUMIN: 2.7 g/dL — AB (ref 3.5–5.0)
Anion gap: 13 (ref 5–15)
BUN: 58 mg/dL — AB (ref 8–23)
CALCIUM: 9.3 mg/dL (ref 8.9–10.3)
CO2: 25 mmol/L (ref 22–32)
CREATININE: 4.12 mg/dL — AB (ref 0.44–1.00)
Chloride: 104 mmol/L (ref 98–111)
GFR, EST AFRICAN AMERICAN: 11 mL/min — AB (ref >60)
GFR, EST NON AFRICAN AMERICAN: 9 mL/min — AB (ref >60)
Glucose, Bld: 94 mg/dL (ref 70–99)
PHOSPHORUS: 5 mg/dL — AB (ref 2.5–4.6)
Potassium: 3.7 mmol/L (ref 3.5–5.1)
SODIUM: 142 mmol/L (ref 135–145)

## 2017-12-29 LAB — IRON AND TIBC
IRON: 48 ug/dL (ref 28–170)
Saturation Ratios: 18 % (ref 10.4–31.8)
TIBC: 270 ug/dL (ref 250–450)
UIBC: 222 ug/dL

## 2017-12-29 LAB — CULTURE, BLOOD (ROUTINE X 2)
CULTURE: NO GROWTH
Culture: NO GROWTH
Culture: NO GROWTH
Culture: NO GROWTH
SPECIAL REQUESTS: ADEQUATE
SPECIAL REQUESTS: ADEQUATE

## 2017-12-29 LAB — GLUCOSE, CAPILLARY
Glucose-Capillary: 88 mg/dL (ref 70–99)
Glucose-Capillary: 103 mg/dL — ABNORMAL HIGH (ref 70–99)
Glucose-Capillary: 90 mg/dL (ref 70–99)

## 2017-12-29 LAB — FERRITIN: FERRITIN: 215 ng/mL (ref 11–307)

## 2017-12-29 LAB — HEPARIN LEVEL (UNFRACTIONATED): HEPARIN UNFRACTIONATED: 0.39 [IU]/mL (ref 0.30–0.70)

## 2017-12-29 MED ORDER — DARBEPOETIN ALFA 100 MCG/0.5ML IJ SOSY
100.0000 ug | PREFILLED_SYRINGE | Freq: Once | INTRAMUSCULAR | Status: AC
  Administered 2017-12-29: 100 ug via SUBCUTANEOUS
  Filled 2017-12-29: qty 0.5

## 2017-12-29 MED ORDER — BOOST / RESOURCE BREEZE PO LIQD CUSTOM
1.0000 | Freq: Two times a day (BID) | ORAL | Status: DC
  Administered 2017-12-29 – 2018-01-01 (×4): 1 via ORAL

## 2017-12-29 MED ORDER — SODIUM CHLORIDE 0.9 % IV SOLN
510.0000 mg | Freq: Once | INTRAVENOUS | Status: AC
  Administered 2017-12-29: 510 mg via INTRAVENOUS
  Filled 2017-12-29: qty 17

## 2017-12-29 NOTE — Progress Notes (Signed)
PROGRESS NOTE  [name] ZOX:[id number] DOB: [birth year] DOA: 12/24/2017 PCP: Pearson Grippe, MD  Brief Narrative: 84 year old woman complicated PMH including oxygen dependent COPD, systolic CHF, paroxysmal atrial fibrillation, CKD, pulmonary embolism, presented with acute shortness of breath, failed BiPAP after vomiting he was intubated in the emergency department and admitted by critical care.  She was a treated for acute pulmonary edema, acute CHF, COPD and was subsequently extubated, also treated for suspected bilateral pneumonia.  Renal function continued to worsen and at this point dialysis may be needed in the future.  Assessment/Plan Acute on chronic systolic/diastolic CHF --Continue management as per cardiology.  Lasix on hold.    Acute on chronic hypoxic respiratory failure, combination of CHF, oxygen dependent COPD, bilateral multilobar pneumonia --Initially appears stable on 3 L nasal cannula.  Continue cefepime.  AKI on CKD stage IV thought to be hemodynamically mediated in the setting of CHF and poor renal perfusion.  Nonoliguric. --Creatinine rising.  Nephrology following.  Lasix on hold.  Vein mapping ordered.  Palliative medicine consultation ordered.    Demand ischemia, no further ischemic work-up per cardiology.  DM type 2 --Stable.  Continue sliding scale insulin.  Severe aortic stenosis status post TAVR 2014 --Felt to be working properly per cardiology  PAF on warfarin, status post dual-chamber pacemaker --Continue heparin and metoprolol per cardiology  Hypothyroidism. --Levothyroxine was adjusted to 100 mcg since admission  Dementia  Aortic atherosclerosis   Worsening renal function.  Continue management per nephrology and cardiology.    Palliative medicine consultation pending.   DVT prophylaxis: heparin infusion Code Status: partial Family Communication: none Disposition Plan: pending    Brendia Sacks, MD  [facility] Direct contact:  [phone] --Via amion app OR  --[email or web address]; password [email or web address]  7PM-7AM contact night coverage as above 12/29/2017, 4:15 PM  LOS: 5 days   Consultants:  Cardiology  PCCM  Nephrology   Procedures:  ETT  Antimicrobials:  Cefepime   Interval history/Subjective: Feels okay today.  "I want to sue that doctor that discharge from the last time".  Objective: Vitals:  Vitals:   12/29/17 0753 12/29/17 1316  BP:  (!) 103/51  Pulse:  61  Resp:  16  Temp:  98.2 F (36.8 C)  SpO2: 97% 98%    Exam: Constitutional:   . Appears calm and comfortable Respiratory:  . CTA bilaterally, no w/r/r.  . Respiratory effort normal.  Cardiovascular:  . RRR, no m/r/g . Telemetry SR/ST . No LE extremity edema   Psychiatric:  . Mental status o Mood, affect appropriate  I have personally reviewed the following:   Data: . Blood sugars stable. . BUN 58, creatinine 4.12.  Potassium within normal limits.  Scheduled Meds: . Chlorhexidine Gluconate Cloth  6 each Topical Q0600  . feeding supplement  1 Container Oral BID BM  . insulin aspart  0-9 Units Subcutaneous TID WC  . ipratropium-albuterol  3 mL Nebulization Q6H  . levothyroxine  100 mcg Per Tube QAC breakfast  . mouth rinse  15 mL Mouth Rinse BID  . mupirocin ointment  1 application Nasal BID   Continuous Infusions: . sodium chloride    . ceFEPime (MAXIPIME) IV 1 g (12/29/17 0541)  . heparin 950 Units/hr (12/28/17 1936)    Active Problems:   Acute on chronic combined systolic and diastolic CHF (congestive heart failure) (HCC)   Hypothyroidism   CKD (chronic kidney disease) stage 4, GFR 15-29 ml/min (HCC)   Acute on chronic respiratory failure with hypoxia (HCC)  Acute renal failure with acute tubular necrosis superimposed on stage 4 chronic kidney disease (HCC)   Lobar pneumonia (HCC)   Demand ischemia (HCC)   LOS: 5 days

## 2017-12-29 NOTE — Progress Notes (Signed)
Nutrition Follow-up  DOCUMENTATION CODES:   Not applicable  INTERVENTION:    Boost Breeze po BID, each supplement provides 250 kcal and 9 grams of protein, 150 mg phosphorus  NUTRITION DIAGNOSIS:   Inadequate oral intake related to poor appetite as evidenced by meal completion < 25%.  Ongoing  GOAL:   Patient will meet greater than or equal to 90% of their needs  Not meeting  MONITOR:   TF tolerance, Vent status, Labs, Weight trends  REASON FOR ASSESSMENT:   Consult, Ventilator Enteral/tube feeding initiation and management  ASSESSMENT:   84 yo female admitted with acute respiratory failure requiring intubation due to multifocal pneumonia, possible acute CHF. PMH includes COPD, CHF EF 20%, dementia with behavioral distrurbances,CKD, PE   9/7- extubated 9/8- clear liquid diet  Pt agitated upon follow up, asking to get in bed. Pt states her appetite remains poor s/p extubation. She was unable to provide nutrition history PTA. RN states pt consumed sprite zero this morning and refused her tray. Meal completions charted as 0-25% for her last two meals. Pt endorses coughing upon drink fluids. RD observed pt eating italian ice from lunch tray without coughing. If reported coughing continues, she may benefit from SLP consult. RD to provide supplementation to maximize calorie and protein.   Weight noted to be stable around 157 lb since last RD visit on 9/5. Lasix are held at this time. Will continue to monitor.   Per notes yesterday, pt showed to be oliguric. RN states she drained 1L out of pt's catheter this afternoon. Nephrology following and notes if kidney function continues to worsen the only option would be dialysis.     Medications reviewed and include: IV abx Labs reviewed: Phosphorus 5.0 (H)   Diet Order:   Diet Order            Diet clear liquid Room service appropriate? Yes; Fluid consistency: Thin  Diet effective now              EDUCATION NEEDS:   Not  appropriate for education at this time  Skin:  Skin Assessment: Reviewed RN Assessment  Last BM:  12/26/17  Height:   Ht Readings from Last 1 Encounters:  12/24/17 5\' 4"  (1.626 m)    Weight:   Wt Readings from Last 1 Encounters:  12/29/17 71.5 kg    Ideal Body Weight:  54.5 kg  BMI:  Body mass index is 27.06 kg/m.  Estimated Nutritional Needs:   Kcal:  1400-1600 kcal  Protein:  70-85 grams  Fluid:  >/= 1.4 L/day  Vanessa Kick RD, LDN Clinical Nutrition Pager # - [phone]

## 2017-12-29 NOTE — Progress Notes (Signed)
ANTICOAGULATION CONSULT NOTE - Follow Up Consult  Pharmacy Consult for heparin Indication: atrial fibrillation  Allergies  Allergen Reactions  . Nubain [Nalbuphine Hcl] Hives    Went into cardiac arrest   . Codeine Hives and Nausea Only    Has tolerated Oxycodone  . Darvocet [Propoxyphene N-Acetaminophen] Nausea Only    Patient Measurements: Height: 5\' 4"  (162.6 cm) Weight: 157 lb 10.1 oz (71.5 kg) IBW/kg (Calculated) : 54.7 Heparin Dosing Weight: 61.4  Vital Signs: Temp: 98.2 F (36.8 C) (09/10 1316) Temp Source: Oral (09/10 1316) BP: 103/51 (09/10 1316) Pulse Rate: 61 (09/10 1316)  Labs: Recent Labs    12/27/17 0221 12/27/17 1340 12/28/17 0947 12/29/17 0357  HGB 8.8* 9.4* 8.9*  --   HCT 28.1* 29.9* 28.5*  --   PLT 313  --  306  --   HEPARINUNFRC 0.47 0.54 0.39 0.39  CREATININE 3.63*  --  3.95* 4.12*    Estimated Creatinine Clearance: 10 mL/min (A) (by C-G formula based on SCr of 4.12 mg/dL (H)).   Medications:  Scheduled:  . Chlorhexidine Gluconate Cloth  6 each Topical Q0600  . feeding supplement  1 Container Oral BID BM  . insulin aspart  0-9 Units Subcutaneous TID WC  . ipratropium-albuterol  3 mL Nebulization Q6H  . levothyroxine  100 mcg Per Tube QAC breakfast  . mouth rinse  15 mL Mouth Rinse BID  . mupirocin ointment  1 application Nasal BID    Assessment: 84 yo female with pAF (on warfarin PTA) and TAVR (2014) presenting with respiratory distress and CHF exacerbation. Pharmacy has been consulted to start a heparin drip with subtherapeutic INR of 1.1 on admission.   Heparin level therapeutic today at 0.39, no new hgb today. Hgb 8.9 yesterday. No bleeding noted.    Goal of Therapy:  Heparin level 0.3-0.7 units/ml Monitor platelets by anticoagulation protocol: Yes   Plan:  Continue heparin infusion at 950 units/hr Check anti-Xa level daily while on heparin Continue to monitor H&H and platelets   Thank you for allowing pharmacy to be a  part of this patient's care.  Bradley Ferris, PharmD 12/29/2017 3:44 PM PGY-1 Pharmacy Resident Direct Phone: [phone] Please check [email or web address] for unit-specific pharmacist phone numbers

## 2017-12-29 NOTE — Progress Notes (Addendum)
Subjective: [name] was seen sitting in chair by bedside. She continues to be on supplemental oxygen (4L via  [place]). Patient states that she is doing well and denied having shortness of breath. She continued to cough and bring up productive sputum.   Objective: Vital signs in last 24 hours: Temp:  [98.2 F (36.8 C)-99.3 F (37.4 C)] 98.2 F (36.8 C) (09/10 0500) Pulse Rate:  [87-118] 87 (09/10 0500) Resp:  [18-20] 20 (09/10 0500) BP: (108-123)/(39-66) 108/39 (09/10 0500) SpO2:  [97 %-100 %] 97 % (09/10 0753) Weight:  [71.5 kg] 71.5 kg (09/10 0216) Weight change: -3.8 kg  Intake/Output from previous day: 09/09 0701 - 09/10 0700 In: 227.4 [P.O.:120; I.V.:107.4] Out: 400 [Urine:400] Intake/Output this shift: No intake/output data recorded.  Physical Exam  Constitutional: She appears well-developed and well-nourished. No distress.  HENT:  Head: Normocephalic and atraumatic.  Eyes: Conjunctivae are normal.  Cardiovascular: Normal rate, regular rhythm and normal heart sounds.  Respiratory: Effort normal. No respiratory distress. She has wheezes.  GI: Soft. Bowel sounds are normal. She exhibits no distension. There is no tenderness.  Genitourinary:  Genitourinary Comments: Foley catheter was removed   Musculoskeletal: She exhibits edema (1+ in bilateral lower extremities).  Neurological: She is alert.  Skin: She is not diaphoretic. No erythema.  Psychiatric: She has a normal mood and affect. Her behavior is normal. Judgment and thought content normal.    Lab Results: Recent Labs    12/27/17 0221 12/27/17 1340 12/28/17 0947  WBC 12.5*  --  10.1  HGB 8.8* 9.4* 8.9*  HCT 28.1* 29.9* 28.5*  PLT 313  --  306   BMET:  Recent Labs    12/28/17 0947 12/29/17 0357  NA 142 142  K 3.7 3.7  CL 102 104  CO2 26 25  GLUCOSE 124* 94  BUN 62* 58*  CREATININE 3.95* 4.12*  CALCIUM 9.1 9.3   No results for input(s): PTH in the last 72 hours. Iron Studies:  Recent Labs   12/29/17 0357  IRON 48  TIBC 270  FERRITIN 215   Studies/Results: Dg Chest 2 View  Result Date: 12/27/2017 CLINICAL DATA:  Follow-up congestive heart failure. EXAM: CHEST - 2 VIEW COMPARISON:  12/26/2017 FINDINGS: Endotracheal tube and nasogastric tube have been removed since prior study. Pacemaker remains in appropriate position. Transcatheter aortic valve replacement again noted. Aortic atherosclerosis. Stable mild cardiomegaly. Decreased pulmonary interstitial edema. Persistent small left pleural effusion and left basilar atelectasis. IMPRESSION: Decreased pulmonary interstitial edema. Stable small left pleural effusion and left basilar atelectasis. Electronically Signed   By: Myles Rosenthal M.D.   On: 12/27/2017 14:37   I have reviewed the patient's current medications.  Assessment/Plan:  Acute on chronic kidney injury stage IV Patient is oliguric with of urine output (question whether this is accurately documented however). Creatinine has increased from 3.95 to 4.12. Vein mapping not completed yesterday. However, patient may not be a good dialysis candidate due to age and poor functional status. Palliative care involvement to discuss goals of care for this patient will be helpful.  Euvolemic on examination so will continue to hold lasix.  Anemia of Chronic Kidney Disease  The patient's iron studies show ferritin=215, fe=48, tibc=270, hb=89, hct=28.5. 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 Will give iron and epo  Bone Disease secondary to CKD Patient with ca=9.3, phosphorus=5.0. Patient has vitamin d deficiency and is being supplemented with cholecalciferol 2000u qhs in outpatient setting.    LOS: 5 days  [name] 12/29/2017,8:00 AM

## 2017-12-29 NOTE — Progress Notes (Signed)
Physical Therapy Treatment Patient Details Name: [name] MRN: [id number] DOB: August 18, 1932 Today's Date: 12/29/2017    History of Present Illness 84 year old female with extensive past medical history that is includes but not limited to O2 dependent COPD, morbid obesity, congestive heart failure with a EF of 20%, aortic stenosis severe, paroxysmal atrial fibrillation on Coumadin, dementia, chronic kidney disease with a base creatinine of 2.2, history of pulmonary embolism, who was recently discharged from [facility] on 12/07/2017 for similar episode of acute shortness of breath responding to Lasix. She returned to [facility] emergency department on 12/24/2017 again with acute shortness of breath along with thick sputum she was placed on BiPAP did not tolerate it developed frothy blood-tinged sputum; Intubated 9/5-9/7    PT Comments    Continuing work on functional mobility and activity tolerance;  Session focused on functional transfers; able to practice sit<>stand transfers from recliner with +2 assist -- notably improved compared to PT eval 2 days ago; continue to recommend SNF for rehab post-acutely   Follow Up Recommendations  SNF;Supervision/Assistance - 24 hour     Equipment Recommendations  None recommended by PT    Recommendations for Other Services       Precautions / Restrictions Precautions Precautions: Fall Precaution Comments: Ntoed in chart review she has a RLE brace -- brace was not in room on PT eval    Mobility  Bed Mobility                  Transfers Overall transfer level: Needs assistance Equipment used: Rolling walker (2 wheeled) Transfers: Sit to/from Stand Sit to Stand: Mod assist;Max assist;+2 physical assistance         General transfer comment: Cues to initiate with anterior weight shift and to scoot to edge of chair to prepare; +2 bilateral mod and max assist to power up and stabilize while pt moved hands to RW; performed 3  sit<>stands  Ambulation/Gait                 Stairs             Wheelchair Mobility    Modified Rankin (Stroke Patients Only)       Balance Overall balance assessment: Needs assistance           Standing balance-Leahy Scale: Poor Standing balance comment: Heavily dependent on UE support and assist in standing at RW                            Cognition Arousal/Alertness: Awake/alert Behavior During Therapy: Flat affect Overall Cognitive Status: Within Functional Limits for tasks assessed(for simple mobility)                                        Exercises      General Comments General comments (skin integrity, edema, etc.): on supplemental O2 4 L      Pertinent Vitals/Pain Pain Assessment: Faces Faces Pain Scale: Hurts little more Pain Location: generalized wiht movement Pain Descriptors / Indicators: Grimacing Pain Intervention(s): Monitored during session    Home Living                      Prior Function            PT Goals (current goals can now be found in the care plan section)  Acute Rehab PT Goals Patient Stated Goal: did not state PT Goal Formulation: With patient Time For Goal Achievement: 01/04/18 Potential to Achieve Goals: Good Progress towards PT goals: Progressing toward goals(slowly)    Frequency    Min 2X/week      PT Plan Current plan remains appropriate    Co-evaluation              AM-PAC PT "6 Clicks" Daily Activity  Outcome Measure  Difficulty turning over in bed (including adjusting bedclothes, sheets and blankets)?: A Lot Difficulty moving from lying on back to sitting on the side of the bed? : Unable Difficulty sitting down on and standing up from a chair with arms (e.g., wheelchair, bedside commode, etc,.)?: Unable Help needed moving to and from a bed to chair (including a wheelchair)?: A Lot Help needed walking in hospital room?: Total Help needed climbing  3-5 steps with a railing? : Total 6 Click Score: 8    End of Session Equipment Utilized During Treatment: Gait belt;Oxygen Activity Tolerance: Patient limited by fatigue Patient left: in chair;with call bell/phone within reach;with chair alarm set Nurse Communication: Mobility status PT Visit Diagnosis: Muscle weakness (generalized) (M62.81);Pain;History of falling (Z91.81)     Time: 1030-1050 PT Time Calculation (min) (ACUTE ONLY): 20 min  Charges:  $Therapeutic Activity: 8-22 mins                     Van Clines, PT  Acute Rehabilitation Services Pager [phone] Office [phone]    Levi Aland 12/29/2017, 1:36 PM

## 2017-12-30 DIAGNOSIS — Z515 Encounter for palliative care: Secondary | ICD-10-CM

## 2017-12-30 DIAGNOSIS — J9602 Acute respiratory failure with hypercapnia: Secondary | ICD-10-CM

## 2017-12-30 DIAGNOSIS — J9601 Acute respiratory failure with hypoxia: Secondary | ICD-10-CM

## 2017-12-30 DIAGNOSIS — Z7189 Other specified counseling: Secondary | ICD-10-CM

## 2017-12-30 LAB — BASIC METABOLIC PANEL
Anion gap: 13 (ref 5–15)
BUN: 56 mg/dL — ABNORMAL HIGH (ref 8–23)
CALCIUM: 9.5 mg/dL (ref 8.9–10.3)
CO2: 23 mmol/L (ref 22–32)
CREATININE: 3.97 mg/dL — AB (ref 0.44–1.00)
Chloride: 105 mmol/L (ref 98–111)
GFR calc Af Amer: 11 mL/min — ABNORMAL LOW (ref >60)
GFR, EST NON AFRICAN AMERICAN: 10 mL/min — AB (ref >60)
GLUCOSE: 97 mg/dL (ref 70–99)
Potassium: 3.7 mmol/L (ref 3.5–5.1)
SODIUM: 141 mmol/L (ref 135–145)

## 2017-12-30 LAB — GLUCOSE, CAPILLARY
GLUCOSE-CAPILLARY: 98 mg/dL (ref 70–99)
Glucose-Capillary: 86 mg/dL (ref 70–99)
Glucose-Capillary: 111 mg/dL — ABNORMAL HIGH (ref 70–99)

## 2017-12-30 LAB — CBC
HCT: 27.2 % — ABNORMAL LOW (ref 36.0–46.0)
Hemoglobin: 8.5 g/dL — ABNORMAL LOW (ref 12.0–15.0)
MCH: 30.8 pg (ref 26.0–34.0)
MCHC: 31.3 g/dL (ref 30.0–36.0)
MCV: 98.6 fL (ref 78.0–100.0)
PLATELETS: 281 10*3/uL (ref 150–400)
RBC: 2.76 MIL/uL — ABNORMAL LOW (ref 3.87–5.11)
RDW: 15.5 % (ref 11.5–15.5)
WBC: 7.2 10*3/uL (ref 4.0–10.5)

## 2017-12-30 LAB — HEPARIN LEVEL (UNFRACTIONATED)
Heparin Unfractionated: <0.1 IU/mL — ABNORMAL LOW (ref 0.30–0.70)
Heparin Unfractionated: 0.35 IU/mL (ref 0.30–0.70)

## 2017-12-30 MED ORDER — TORSEMIDE 20 MG PO TABS
20.0000 mg | ORAL_TABLET | Freq: Every day | ORAL | Status: DC
  Administered 2017-12-30: 20 mg via ORAL
  Filled 2017-12-30 (×2): qty 1

## 2017-12-30 MED ORDER — IPRATROPIUM-ALBUTEROL 0.5-2.5 (3) MG/3ML IN SOLN
3.0000 mL | Freq: Two times a day (BID) | RESPIRATORY_TRACT | Status: DC
  Administered 2017-12-30 – 2018-01-02 (×6): 3 mL via RESPIRATORY_TRACT
  Filled 2017-12-30 (×6): qty 3

## 2017-12-30 MED ORDER — GUAIFENESIN ER 600 MG PO TB12
1200.0000 mg | ORAL_TABLET | Freq: Two times a day (BID) | ORAL | Status: DC
  Administered 2017-12-30 – 2018-01-02 (×7): 1200 mg via ORAL
  Filled 2017-12-30 (×7): qty 2

## 2017-12-30 MED ORDER — ACETAMINOPHEN 325 MG PO TABS
650.0000 mg | ORAL_TABLET | Freq: Four times a day (QID) | ORAL | Status: DC | PRN
  Administered 2017-12-30 – 2018-01-02 (×4): 650 mg via ORAL
  Filled 2017-12-30 (×5): qty 2

## 2017-12-30 NOTE — Progress Notes (Signed)
Ok to put in stop date of 7d for cefepime.   Ulyses Southward, PharmD, [place], AAHIVP, CPP Infectious Disease Pharmacist Pager: [phone] 12/30/2017 3:57 PM

## 2017-12-30 NOTE — Progress Notes (Addendum)
ANTICOAGULATION CONSULT NOTE - Follow Up Consult  Pharmacy Consult for heparin Indication: atrial fibrillation  Allergies  Allergen Reactions  . Nubain [Nalbuphine Hcl] Hives    Went into cardiac arrest   . Codeine Hives and Nausea Only    Has tolerated Oxycodone  . Darvocet [Propoxyphene N-Acetaminophen] Nausea Only    Patient Measurements: Height: 5\' 4"  (162.6 cm) Weight: 158 lb 4.6 oz (71.8 kg) IBW/kg (Calculated) : 54.7 Heparin Dosing Weight: 61.4  Vital Signs: Temp: 98.5 F (36.9 C) (09/11 0644) BP: 150/72 (09/11 1413) Pulse Rate: 103 (09/11 1413)  Labs: Recent Labs    12/28/17 0947 12/29/17 0357 12/30/17 0356 12/30/17 1451  HGB 8.9*  --  8.5*  --   HCT 28.5*  --  27.2*  --   PLT 306  --  281  --   HEPARINUNFRC 0.39 0.39 <0.10* 0.35  CREATININE 3.95* 4.12* 3.97*  --     Estimated Creatinine Clearance: 10.4 mL/min (A) (by C-G formula based on SCr of 3.97 mg/dL (H)).   Medications:  Scheduled:  . Chlorhexidine Gluconate Cloth  6 each Topical Q0600  . feeding supplement  1 Container Oral BID BM  . guaiFENesin  1,200 mg Oral BID  . insulin aspart  0-9 Units Subcutaneous TID WC  . ipratropium-albuterol  3 mL Nebulization BID  . levothyroxine  100 mcg Per Tube QAC breakfast  . mouth rinse  15 mL Mouth Rinse BID  . mupirocin ointment  1 application Nasal BID  . torsemide  20 mg Oral Daily    Assessment: 84 yo female with pAF (on warfarin PTA) and TAVR (2014) presenting with respiratory distress and CHF exacerbation. Pharmacy has been consulted to dose heparin.  Heparin level therapeutic this afternoon at 0.35 after previously being undetectable early AM. This was likely due to line issues with patient bending arm.   Hgb 8.5 today. No bleeding noted. Will continue with heparin 950 units / hr.     Goal of Therapy:  Heparin level 0.3-0.7 units/ml Monitor platelets by anticoagulation protocol: Yes   Plan:  Continue heparin infusion at 950  units/hr Check anti-Xa level daily while on heparin Continue to monitor H&H and platelets   Thank you for allowing pharmacy to be a part of this patient's care.  Bradley Ferris, PharmD 12/30/2017 6:14 PM PGY-1 Pharmacy Resident Direct Phone: [phone] Please check [email or web address] for unit-specific pharmacist phone numbers

## 2017-12-30 NOTE — Consult Note (Signed)
Consultation Note Date: 12/30/2017   Patient Name: [name]  DOB: 05/23/1932  MRN: [id number]  Age / Sex: 84 y.o., female  PCP: Pearson Grippe, MD Referring Physician: Starleen Arms, MD  Reason for Consultation: Establishing goals of care  HPI/Patient Profile: 84 y.o. female  with past medical history of CHF, COPD on chronic O2 with multiple recent hospitalizations for exacerbations and pneumonia, CKD, severe aortic stenosis s/p TAVR, hx CVA, dementia admitted on 12/24/2017 with acute respiratory failure requiring intubation-extubated 9/7 - workup reveals pneumonia, CHF exacerbation, AKI on CKD-IV, anemia of CKD. Palliative medicine consulted for GOC and discussion re: possible dialysis decision.    Clinical Assessment and Goals of Care: Reviewed patient's chart and evaluated patient in bed. She has a MOST form that was completed by herself on 8/21 indicating full code status, limited medical interventions, IV fluids ok, feeding tube for trial period.   She is complaining of vomiting recently after eating some broth. No diarrhea or stomach pain, LBM 9/10. She states she does not like "sweet stuff"- the Resource is not good to her. Intake looks as though it has been very limited, however, it does not appear that meal documentation has been very accurate.   Evan has three children- son, Margarite Gouge and Luster Landsberg. No family is at bedside today.    When discussing her health she tells me she has been in the hospital several times in the last few months because they discharge her too early. Chart review shows she has been admitted 7/20-7/25 for CHF exacerbation, 8/16-8/20 for CHF and pneumonia. She tells me she has two things going against her- her heart, and her kidneys. She has been discussing dialysis with the other doctors- she says she would take dialysis if it were offered to her.   Today the  patient was very focused on discussing the temperature in the room, and how she was discharged too early. PMT will attempt to contact her son and children for further GOC discussion.    Primary Decision Maker PATIENT and NOK- patient's chilldren    SUMMARY OF RECOMMENDATIONS -Continue current level of care -F/U meeting with family present arranged for tomorrow 9/12 at 1030am -SLP eval for aspiration d/t patient having recurrent admission for pneumonia, history of dementia, CVA, s/p extubation, vomiting with eating and respiratory treatments   Code Status/Advance Care Planning:  Limited code- no CPR, all other interventions ok  Palliative Prophylaxis:   Frequent Pain Assessment  Additional Recommendations (Limitations, Scope, Preferences):  Full Scope Treatment  Prognosis:    Unable to determine  Discharge Planning: To Be Determined  Primary Diagnoses: Present on Admission: . (Resolved) Respiratory failure (HCC) . Acute on chronic combined systolic and diastolic CHF (congestive heart failure) (HCC) . Acute on chronic respiratory failure with hypoxia (HCC) . CKD (chronic kidney disease) stage 4, GFR 15-29 ml/min (HCC) . Hypothyroidism   I have reviewed the medical record, interviewed the patient and family, and examined the patient. The following aspects are pertinent.  Past Medical  History:  Diagnosis Date  . Acute on chronic renal failure [facility])    sees Dr Allena Katz   . Anemia    Acute blood loss  . Aortic regurgitation   . Aortic stenosis 10/13/2012   Low EF, low gradient with severe aortic stenosis confirmed by dobutamine stress echocardiogram s/p TAVR 12/2012  . Asthma   . Atrial fibrillation (HCC)    tachy-brady syndrome with <1% recurrent PAF since pacemaker placement  . Cataracts, bilateral   . Chronic combined systolic and diastolic CHF (congestive heart failure) (HCC)   . Chronic lower back pain   . CKD (chronic kidney disease)   . Coronary artery disease  involving native coronary artery of native heart   . Dementia    Without behavioral disturbance  . Dysrhythmia   . Fibromyalgia   . Gastroesophageal reflux disease   . H/O dizziness   . H/O urinary frequency   . H/O: stroke   . Hard of hearing   . Headache    hx of migraines   . Heart murmur   . History of blood transfusion   . History of bronchitis   . History of kidney stones   . History of urinary tract infection   . HLD (hyperlipidemia)   . HTN (hypertension)   . Hypothyroidism   . Neuropathy   . Nonischemic cardiomyopathy (HCC)   . On home oxygen therapy    patient uses at nite- 2L- has not used in > 6 months per patient  . Osteoarthritis   . Osteoporosis   . Pneumonia    hx of x 3   . PONV (postoperative nausea and vomiting)   . Presence of permanent cardiac pacemaker   . Pulmonary embolism (HCC)    HISTORY OF, the pt. had a recurrent bilateral pulmonary emboli in 2005, on warfarin therapy and at which time she under went implantation of IVC filter  . Repeated falls   . Rupture of right patellar tendon   . Sciatica   . Shortness of breath dyspnea    with exertion   . Sleep apnea    uses oxygen at night and PRN- not used since > 6 months / DOES NOT USE  C-PAP  . Spinal stenosis   . Stroke (HCC)   . Symptomatic bradycardia   . Symptomatic bradycardia 2012   s/p Medtronic PPM  . Syncope   . Urinary incontinence   . Urinary urgency    Social History   Socioeconomic History  . Marital status: Divorced    Spouse name: Not on file  . Number of children: Not on file  . Years of education: Not on file  . Highest education level: Not on file  Occupational History  . Not on file  Social Needs  . Financial resource strain: Not on file  . Food insecurity:    Worry: Not on file    Inability: Not on file  . Transportation needs:    Medical: Not on file    Non-medical: Not on file  Tobacco Use  . Smoking status: Never Smoker  . Smokeless tobacco: Never Used    Substance and Sexual Activity  . Alcohol use: No  . Drug use: No  . Sexual activity: Not Currently  Lifestyle  . Physical activity:    Days per week: Not on file    Minutes per session: Not on file  . Stress: Not on file  Relationships  . Social connections:    Talks on phone:  Not on file    Gets together: Not on file    Attends religious service: Not on file    Active member of club or organization: Not on file    Attends meetings of clubs or organizations: Not on file    Relationship status: Not on file  Other Topics Concern  . Not on file  Social History Narrative  . Not on file   Family History  Problem Relation Age of Onset  . Ovarian cancer Mother        Deceased  . Epilepsy Father        Deceased   Scheduled Meds: . Chlorhexidine Gluconate Cloth  6 each Topical Q0600  . feeding supplement  1 Container Oral BID BM  . insulin aspart  0-9 Units Subcutaneous TID WC  . ipratropium-albuterol  3 mL Nebulization BID  . levothyroxine  100 mcg Per Tube QAC breakfast  . mouth rinse  15 mL Mouth Rinse BID  . mupirocin ointment  1 application Nasal BID  . torsemide  20 mg Oral Daily   Continuous Infusions: . sodium chloride    . ceFEPime (MAXIPIME) IV 1 g (12/30/17 0523)  . heparin 950 Units/hr (12/30/17 0526)   PRN Meds:.sodium chloride, albuterol, hydrALAZINE, HYDROcodone-acetaminophen, metoprolol tartrate, ondansetron (ZOFRAN) IV Medications Prior to Admission:  Prior to Admission medications   Medication Sig Start Date End Date Taking? Authorizing Provider  allopurinol (ZYLOPRIM) 100 MG tablet Take 100 mg by mouth daily.   Yes [provider]  aluminum-magnesium hydroxide-simethicone (MAALOX) 200-200-20 MG/5ML SUSP Take 30 mLs by mouth 3 (three) times daily as needed (for indegestion).   Yes [provider]  amiodarone (PACERONE) 100 MG tablet Take 100 mg by mouth daily.   Yes [provider]  atorvastatin (LIPITOR) 10 MG tablet Take 10 mg  by mouth daily at 6 PM.    Yes [provider]  calcitRIOL (ROCALTROL) 0.25 MCG capsule Take 0.25 mcg by mouth daily.    Yes [provider]  cholecalciferol (VITAMIN D) 1000 units tablet Take 2,000 Units by mouth at bedtime.   Yes [provider]  cycloSPORINE (RESTASIS) 0.05 % ophthalmic emulsion Place 1 drop into both eyes 2 (two) times daily.    Yes [provider]  docusate sodium (COLACE) 100 MG capsule Take 1 capsule (100 mg total) by mouth 2 (two) times daily. 12/08/17  Yes Leroy Sea, MD  donepezil (ARICEPT) 10 MG tablet Take 10 mg by mouth at bedtime.    Yes [provider]  DULoxetine (CYMBALTA) 60 MG capsule Take 60 mg by mouth daily at 6 PM.    Yes [provider]  ferrous sulfate 325 (65 FE) MG tablet Take 325 mg by mouth daily.    Yes [provider]  gabapentin (NEURONTIN) 100 MG capsule Take 200 mg by mouth 2 (two) times daily.   Yes [provider]  ipratropium-albuterol (DUONEB) 0.5-2.5 (3) MG/3ML SOLN Take 3 mLs by nebulization every 6 (six) hours as needed (wheezing/shortness of breath).   Yes [provider]  ipratropium-albuterol (DUONEB) 0.5-2.5 (3) MG/3ML SOLN Take 3 mLs by nebulization 2 (two) times daily.   Yes [provider]  isosorbide mononitrate (IMDUR) 30 MG 24 hr tablet Take 1 tablet (30 mg total) by mouth daily. Patient taking differently: Take 60 mg by mouth daily.  08/05/16 12/24/25 Yes Bhagat, Bhavinkumar, PA  levothyroxine (SYNTHROID, LEVOTHROID) 100 MCG tablet Take 1 tablet (100 mcg total) by mouth daily before breakfast.  11/12/17  Yes Berton Mount I, MD  lidocaine (LIDODERM) 5 % Place 1 patch onto the skin daily. Apply to lower back as needed for pain.   Yes [provider]  linaclotide (LINZESS) 290 MCG CAPS capsule Take 290 mcg by mouth daily.    Yes [provider]  Menthol, Topical Analgesic, (BIOFREEZE) 4 % GEL Apply 1 application topically 2  (two) times daily.   Yes [provider]  metolazone (ZAROXOLYN) 2.5 MG tablet 1 pill every Wednesday as needed once a week for weight gain over 2.5 pounds, edema or shortness of breath. Patient taking differently: Take 2.5 mg by mouth See admin instructions. 1 pill every Wednesday as needed once a week for weight gain over 2.5 pounds, edema or shortness of breath. 12/08/17  Yes Leroy Sea, MD  metoprolol tartrate (LOPRESSOR) 25 MG tablet Take 0.5 tablets (12.5 mg total) by mouth 2 (two) times daily. 12/08/17  Yes Leroy Sea, MD  morphine 20 MG/5ML solution Take 10 mg by mouth every 2 (two) hours as needed for pain.   Yes [provider]  Multiple Vitamin (MULTIVITAMIN WITH MINERALS) TABS tablet Take 1 tablet by mouth daily.   Yes [provider]  mupirocin ointment (BACTROBAN) 2 % Place 1 application into the nose 2 (two) times daily. 11/12/17  Yes Barnetta Chapel, MD  nitroGLYCERIN (NITROSTAT) 0.4 MG SL tablet Place 0.4 mg under the tongue every 5 (five) minutes as needed for chest pain (MAX 3 TABLETS).    Yes [provider]  ondansetron (ZOFRAN) 4 MG tablet Take 4 mg by mouth every 8 (eight) hours as needed for nausea or vomiting.   Yes [provider]  polyethylene glycol (MIRALAX / GLYCOLAX) packet Take 17 g by mouth daily.   Yes [provider]  torsemide (DEMADEX) 20 MG tablet Take 40 mg by mouth daily.    Yes [provider]  warfarin (COUMADIN) 3 MG tablet Take 3 mg by mouth daily.   Yes [provider]  bisacodyl (DULCOLAX) 10 MG suppository Place 1 suppository (10 mg total) rectally daily as needed for moderate constipation. Patient not taking: Reported on 12/24/2017 12/08/17   Leroy Sea, MD  HYDROcodone-acetaminophen (NORCO/VICODIN) 5-325 MG tablet Take 1 tablet by mouth every 4 (four) hours as needed for severe pain. Patient not taking: Reported on 12/24/2017 11/12/17   Barnetta Chapel, MD    Allergies  Allergen Reactions  . Nubain [Nalbuphine Hcl] Hives    Went into cardiac arrest   . Codeine Hives and Nausea Only    Has tolerated Oxycodone  . Darvocet [Propoxyphene N-Acetaminophen] Nausea Only   Review of Systems  Constitutional: Positive for activity change, appetite change and fatigue.  Respiratory: Positive for cough and shortness of breath.     Physical Exam  Constitutional: She appears well-developed and well-nourished. No distress.  Cardiovascular: Normal rate.  Pulmonary/Chest: Effort normal.  Abdominal: Soft.  Neurological: She is alert.  Psychiatric:  Anxious affect  Nursing note and vitals reviewed.   Vital Signs: BP (!) 141/78 (BP Location: Left Arm)   Pulse 88   Temp 98.5 F (36.9 C)   Resp 20   Ht 5\' 4"  (1.626 m)   Wt 71.5 kg   SpO2 97%   BMI 27.06 kg/m  Pain Scale: 0-10   Pain Score: 0-No pain   SpO2: SpO2: 97 % O2 Device:SpO2: 97 % O2 Flow Rate: .O2 Flow Rate (L/min): 3 L/min  IO: Intake/output summary:  Intake/Output Summary (Last 24 hours) at 12/30/2017 1215 Last data filed at 12/30/2017 1100 Gross per 24 hour  Intake 167.5 ml  Output 150 ml  Net 17.5 ml    LBM: Last BM Date: 12/26/17 Baseline Weight: Weight: 72 kg Most recent weight: Weight: 71.5 kg     Palliative Assessment/Data: PPS: 20%     Thank you for this consult. Palliative medicine will continue to follow and assist as needed.   Time In: 1100 Time Out: 1215 Time Total: 75 minutes Greater than 50%  of this time was spent counseling and coordinating care related to the above assessment and plan.  Signed by: Ocie Bob, AGNP-C Palliative Medicine    Please contact Palliative Medicine Team phone at [phone] for questions and concerns.  For individual provider: See Loretha Stapler

## 2017-12-30 NOTE — Evaluation (Addendum)
Clinical/Bedside Swallow Evaluation Patient Details  Name: [name] MRN: [id number] Date of Birth: Jul 30, 1932  Today's Date: 12/30/2017 Time: SLP Start Time (ACUTE ONLY): 1420 SLP Stop Time (ACUTE ONLY): 1440 SLP Time Calculation (min) (ACUTE ONLY): 20 min  Past Medical History:  Past Medical History:  Diagnosis Date  . Acute on chronic renal failure [facility])    sees Dr Allena Katz   . Anemia    Acute blood loss  . Aortic regurgitation   . Aortic stenosis 10/13/2012   Low EF, low gradient with severe aortic stenosis confirmed by dobutamine stress echocardiogram s/p TAVR 12/2012  . Asthma   . Atrial fibrillation (HCC)    tachy-brady syndrome with <1% recurrent PAF since pacemaker placement  . Cataracts, bilateral   . Chronic combined systolic and diastolic CHF (congestive heart failure) (HCC)   . Chronic lower back pain   . CKD (chronic kidney disease)   . Coronary artery disease involving native coronary artery of native heart   . Dementia    Without behavioral disturbance  . Dysrhythmia   . Fibromyalgia   . Gastroesophageal reflux disease   . H/O dizziness   . H/O urinary frequency   . H/O: stroke   . Hard of hearing   . Headache    hx of migraines   . Heart murmur   . History of blood transfusion   . History of bronchitis   . History of kidney stones   . History of urinary tract infection   . HLD (hyperlipidemia)   . HTN (hypertension)   . Hypothyroidism   . Neuropathy   . Nonischemic cardiomyopathy (HCC)   . On home oxygen therapy    patient uses at nite- 2L- has not used in > 6 months per patient  . Osteoarthritis   . Osteoporosis   . Pneumonia    hx of x 3   . PONV (postoperative nausea and vomiting)   . Presence of permanent cardiac pacemaker   . Pulmonary embolism (HCC)    HISTORY OF, the pt. had a recurrent bilateral pulmonary emboli in 2005, on warfarin therapy and at which time she under went implantation of IVC filter  . Repeated falls   . Rupture of  right patellar tendon   . Sciatica   . Shortness of breath dyspnea    with exertion   . Sleep apnea    uses oxygen at night and PRN- not used since > 6 months / DOES NOT USE  C-PAP  . Spinal stenosis   . Stroke (HCC)   . Symptomatic bradycardia   . Symptomatic bradycardia 2012   s/p Medtronic PPM  . Syncope   . Urinary incontinence   . Urinary urgency    Past Surgical History:  Past Surgical History:  Procedure Laterality Date  . ABDOMINAL HYSTERECTOMY    . APPENDECTOMY    . BACK SURGERY    . BUNIONECTOMY    . CARDIAC CATHETERIZATION    . CATARACT EXTRACTION    . CENTRAL VENOUS CATHETER INSERTION Left 12/21/2012   Procedure: INSERTION CENTRAL LINE ADULT;  Surgeon: Tonny Bollman, MD;  Location: [facility] OR;  Service: Open Heart Surgery;  Laterality: Left;  . ELBOW SURGERY     bilat   . INTRAOPERATIVE TRANSESOPHAGEAL ECHOCARDIOGRAM N/A 12/21/2012   Procedure: INTRAOPERATIVE TRANSESOPHAGEAL ECHOCARDIOGRAM;  Surgeon: Tonny Bollman, MD;  Location: [facility] OR;  Service: Open Heart Surgery;  Laterality: N/A;  . KNEE SURGERY Left   . LEFT AND RIGHT HEART CATHETERIZATION  WITH CORONARY ANGIOGRAM N/A 10/18/2012   Procedure: LEFT AND RIGHT HEART CATHETERIZATION WITH CORONARY ANGIOGRAM;  Surgeon: Tonny Bollman, MD;  Location: [facility] CATH LAB;  Service: Cardiovascular;  Laterality: N/A;  . NASAL SEPTUM SURGERY    . NOSE SURGERY     X 2  . ORIF PATELLA Right 03/08/2015   Procedure:  OPEN REDUCTION INTERNAL FIXATION RIGHT  PATELLA TENDON AVULSION;  Surgeon: Durene Romans, MD;  Location: [facility] ORS;  Service: Orthopedics;  Laterality: Right;  . PACEMAKER INSERTION     Medtronic  . PATELLAR TENDON REPAIR Right 06/25/2015   Procedure: RIGHT PATELLA TENDON REVISION/REPAIR;  Surgeon: Durene Romans, MD;  Location: [facility] ORS;  Service: Orthopedics;  Laterality: Right;  . SHOULDER SURGERY     bilat   . THYROIDECTOMY, PARTIAL    . TONSILLECTOMY    . TOTAL KNEE ARTHROPLASTY Right 12/04/2014   Procedure: RIGHT TOTAL  KNEE  ARTHROPLASTY;  Surgeon: Durene Romans, MD;  Location: [facility] ORS;  Service: Orthopedics;  Laterality: Right;  . TRANSCATHETER AORTIC VALVE REPLACEMENT, TRANSFEMORAL  12/21/2012   a. 19mm Edwards Sapien XT transcatheter heart valve placed via open left transfemoral approach b. Intra-op TEE: well-seated bioprosthetic aortic valve with mean gradient 2 mmHg, trivial AI, mild MR, EF 30-35%  . TRANSCATHETER AORTIC VALVE REPLACEMENT, TRANSFEMORAL N/A 12/21/2012   Procedure: TRANSCATHETER AORTIC VALVE REPLACEMENT, TRANSFEMORAL;  Surgeon: Tonny Bollman, MD;  Location: [facility] OR;  Service: Open Heart Surgery;  Laterality: N/A;   HPI:  84 year old female admitted 12/24/17 with SOB. Intubated in ED, extubated 12/26/17. Multiple recent hospitalizations and PNA. PMH: renal failure, CKD, CHF, COPD, dementia, GERD, HLD, HTN, CVA. CXR = stable small left pleural effusion and LLL atelectasis, pacemaker in place.   Assessment / Plan / Recommendation Clinical Impression  Pt is known to ST services, having had MBS in June 2019. At that time, results indicated mild-moderate oral impairments primarily with regular texture, flash penetration of thin liquids. Pt was noted to cough before and frequently during the study when no barium was observed in the vestibule. Esophageal sweep revealed barium tablet slow to pass the GE junction.   Today upon arrival of SLP, pt exhibited ongoing coughing/gagging, removing minimal thin secretions from the oral cavity with suction. When pt was able to stop this response, SLP proceeded with evaluation. Pt accepted trials of thin liquid and puree, and did not exhibit immediate/reflexive cough response. She is at risk for silent aspiration given history of COPD and the potential discoordination of breathing and swallowing. Additionally, pt is at risk for dysphagia/aspiration given advanced age, dementia, GERD and other symptoms suspicious of esophageal dysmotility. The patient herself was unable to verbalize  whether her reaction was a gag or a cough.   She verbalizes being uncertain if she wants to proceed with instrumental studies (MBS and/or regular barium swallow). Per Palliative Care note, a family meeting is scheduled for 12/31/17. Recommend continuing with current (clear liquid) diet until family meeting and establishment of goals of care. ST will follow up after this meeting. MD, NP, and RN informed of results and recommendations.   SLP Visit Diagnosis: Dysphagia, unspecified (R13.10)    Aspiration Risk  Moderate aspiration risk    Diet Recommendation (continue clear liquid diet until goc established)   Liquid Administration via: Cup;Straw Medication Administration: (per pt preference.) Supervision: Patient able to self feed;Staff to assist with self feeding;Intermittent supervision to cue for compensatory strategies Compensations: Minimize environmental distractions;Small sips/bites;Slow rate Postural Changes: Seated upright at 90 degrees;Remain  upright for at least 30 minutes after po intake    Other  Recommendations Recommended Consults: Consider esophageal assessment Oral Care Recommendations: Oral care BID Other Recommendations: Have oral suction available   Follow up Recommendations 24 hour supervision/assistance      Frequency and Duration min 1 x/week  1 week;2 weeks       Prognosis Prognosis for Safe Diet Advancement: Guarded Barriers to Reach Goals: Severity of deficits;Time post onset      Swallow Study   General Date of Onset: 12/24/17 HPI: 84 year old female admitted 12/24/17 with SOB. Intubated in ED, extubated 12/26/17. Multiple recent hospitalizations and PNA. PMH: renal failure, CKD, CHF, COPD, dementia, GERD, HLD, HTN, CVA. CXR = stable small left pleural effusion and LLL atelectasis, pacemaker in place. Type of Study: Bedside Swallow Evaluation Previous Swallow Assessment:  MBS 10/02/17 - rec chopped solids, thin liquids, pills in puree. Diet Prior to this  Study: Thin liquids(clear liquids) Temperature Spikes Noted: No Respiratory Status: Nasal cannula History of Recent Intubation: Yes Length of Intubations (days): 2 days Date extubated: 12/26/17 Behavior/Cognition: Alert;Cooperative Oral Cavity Assessment: Within Functional Limits Oral Care Completed by SLP: No Oral Cavity - Dentition: Edentulous Vision: Functional for self-feeding Self-Feeding Abilities: Able to feed self;Needs set up Patient Positioning: Upright in bed Baseline Vocal Quality: Normal Volitional Cough: Congested Volitional Swallow: Able to elicit    Oral/Motor/Sensory Function Overall Oral Motor/Sensory Function: Within functional limits   Ice Chips Ice chips: Not tested   Thin Liquid Thin Liquid: Within functional limits Presentation: Straw    Nectar Thick Nectar Thick Liquid: Not tested   Honey Thick Honey Thick Liquid: Not tested   Puree Puree: Within functional limits Presentation: Spoon   Solid     Solid: Not tested     Celia B. Murvin Natal [facility], CCC-SLP Speech Language Pathologist [phone]  Leigh Aurora 12/30/2017,2:57 PM

## 2017-12-30 NOTE — Progress Notes (Signed)
ANTICOAGULATION CONSULT NOTE - Follow Up Consult  Pharmacy Consult for heparin Indication: atrial fibrillation  Labs: Recent Labs    12/27/17 1340 12/28/17 0947 12/29/17 0357 12/30/17 0356  HGB 9.4* 8.9*  --  8.5*  HCT 29.9* 28.5*  --  27.2*  PLT  --  306  --  281  HEPARINUNFRC 0.54 0.39 0.39 <0.10*  CREATININE  --  3.95* 4.12* 3.97*    Assessment/Plan:  85yo female undetectable on heparin this am after multiple levels at goal; RN reports that IV line is in [facility] and IV pump has been beeping consistently overnight from pt bending arm, pt has not been following instructions to keep arm straight. Will continue gtt at current rate for now and check additional level after gtt has run for some time during daylight hours.   Vernard Gambles, PharmD, BCPS  12/30/2017,5:36 AM

## 2017-12-30 NOTE — Progress Notes (Signed)
Subjective: Ms. Arender was seen resting in her bed this morning. She continues to bring up a lot of blood tinged sputum. She states that she continues to have chest discomfort and difficult time breathing.   Objective: Vital signs in last 24 hours: Temp:  [98.2 F (36.8 C)-98.5 F (36.9 C)] 98.5 F (36.9 C) (09/11 0644) Pulse Rate:  [61-88] 88 (09/11 0644) Resp:  [16-20] 20 (09/11 0644) BP: (103-141)/(47-78) 141/78 (09/11 0644) SpO2:  [97 %-100 %] 97 % (09/11 0644) Weight change:   Intake/Output from previous day: 09/10 0701 - 09/11 0700 In: 120 [P.O.:120] Out: 1000 [Urine:1000] Intake/Output this shift: No intake/output data recorded.  Physical Exam  Constitutional: She appears well-developed and well-nourished. No distress.  HENT:  Head: Normocephalic and atraumatic.  Eyes: Conjunctivae are normal.  Cardiovascular: Normal rate, regular rhythm and normal heart sounds.  Respiratory: She is in respiratory distress. She has wheezes. She has rales.  Bringing up blood tinged sputum  GI: Soft. Bowel sounds are normal. She exhibits no distension. There is no tenderness.  Musculoskeletal: She exhibits no edema.  Neurological: She is alert.  Skin: She is not diaphoretic.  Psychiatric: She has a normal mood and affect. Her behavior is normal. Judgment and thought content normal.   Lab Results: Recent Labs    12/28/17 0947 12/30/17 0356  WBC 10.1 7.2  HGB 8.9* 8.5*  HCT 28.5* 27.2*  PLT 306 281   BMET:  Recent Labs    12/29/17 0357 12/30/17 0356  NA 142 141  K 3.7 3.7  CL 104 105  CO2 25 23  GLUCOSE 94 97  BUN 58* 56*  CREATININE 4.12* 3.97*  CALCIUM 9.3 9.5   No results for input(s): PTH in the last 72 hours. Iron Studies:  Recent Labs    12/29/17 0357  IRON 48  TIBC 270  FERRITIN 215   Studies/Results: No results found.  I have reviewed the patient's current medications.  Assessment/Plan:  Acute on chronic kidney injury stage IV Thought to be  pre-renal etiology due to poor renal perfusion. Patient's creatinine has improved to 3.97 from 4.12. She has put out 1L of urine over the past 24 hours. Prescribed po torsemide 20mg . Suggest palliative care consult to discuss goals of care.  Anemia of chronic kidney disease  Hb=8.5, below goal hb. Patient was given IV Ferraheme and Aranesp given yesterday 9/10. Expect hemoglobin to improve gradually. Vein mapping has still not been completed.   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 They were noted to be phos=4.7 and ca=9.3. Patient may benefit from continuing home cholecalciferol 2000u.    LOS: 6 days   Kasia Trego 12/30/2017,7:19 AM

## 2017-12-30 NOTE — Progress Notes (Signed)
PROGRESS NOTE  [name] GFR:[id number] DOB: [birth year] DOA: 12/24/2017 PCP: Pearson Grippe, MD  Brief Narrative: 84 year old woman complicated PMH including oxygen dependent COPD, systolic CHF, paroxysmal atrial fibrillation, CKD, pulmonary embolism, presented with acute shortness of breath, failed BiPAP after vomiting he was intubated in the emergency department and admitted by critical care.  She was a treated for acute pulmonary edema, acute CHF, COPD and was subsequently extubated, also treated for suspected bilateral pneumonia.  Renal function continued to worsen and at this point dialysis may be needed in the future.   Subjective - reports, congestion and cough today  Assessment/Plan  Acute on chronic systolic/diastolic CHF --Management per cardiology, initially diuresis on hold given worsening renal function, appears to be improving, she is back on torsemide  Acute on chronic hypoxic respiratory failure, combination of CHF, oxygen dependent COPD, bilateral multilobar pneumonia -She  was intubated in ED, currently back on 3 L nasal cannula --Initially appears stable on 3 L nasal cannula.  Continue cefepime. -SLP consulted regarding recurrent pneumonia -He still having some congestion, will start on Mucinex, encouraged to use incentive spirometry and flutter valve  AKI on CKD stage IV thought to be hemodynamically mediated in the setting of CHF and poor renal perfusion.  Nonoliguric. --Creatinine rising.  Nephrology following.  She is back on torsemide, diuresis per renal, care consulted regarding goals of care  Demand ischemia, no further ischemic work-up per cardiology.  DM type 2 --Stable.  Continue sliding scale insulin.  Severe aortic stenosis status post TAVR 2014 --Felt to be working properly per cardiology  PAF on warfarin, status post dual-chamber pacemaker --On hold, continue with warfarin  Hypothyroidism. --Levothyroxine was adjusted to 100 mcg since  admission  Dementia  Aortic atherosclerosis   Worsening renal function.  Continue management per nephrology and cardiology.    Palliative medicine consultation pending.   DVT prophylaxis: heparin infusion Code Status: partial Family Communication: none Disposition Plan: pending    Huey Bienenstock, MD  [facility] Direct contact: [phone] --Via amion app OR  --[email or web address]; password [email or web address]  7PM-7AM contact night coverage as above 12/30/2017, 2:16 PM  LOS: 6 days   Consultants:  Cardiology  PCCM  Nephrology   Procedures:  ETT  Antimicrobials:  Cefepime     Objective: Vitals:  Vitals:   12/30/17 0830 12/30/17 1413  BP:  (!) 150/72  Pulse:  (!) 103  Resp:  (!) 22  Temp:    SpO2: 97% 96%    Exam:  Awake Alert, Oriented X 3, No new F.N deficits, anxious Symmetrical Chest wall movement, Good air movement bilaterally, CTAB RRR,No Gallops,Rubs or new Murmurs, No Parasternal Heave +ve B.Sounds, Abd Soft, No tenderness, No rebound - guarding or rigidity. No Cyanosis, Clubbing or edema, No new Rash or bruise    I have personally reviewed the following:   Data: . Blood sugars stable. . BUN 58, creatinine 4.12.  Potassium within normal limits.  Scheduled Meds: . Chlorhexidine Gluconate Cloth  6 each Topical Q0600  . feeding supplement  1 Container Oral BID BM  . guaiFENesin  1,200 mg Oral BID  . insulin aspart  0-9 Units Subcutaneous TID WC  . ipratropium-albuterol  3 mL Nebulization BID  . levothyroxine  100 mcg Per Tube QAC breakfast  . mouth rinse  15 mL Mouth Rinse BID  . mupirocin ointment  1 application Nasal BID  . torsemide  20 mg Oral Daily   Continuous Infusions: . sodium chloride    . ceFEPime (MAXIPIME)  IV Stopped (12/30/17 0700)  . heparin 950 Units/hr (12/30/17 0526)    Active Problems:   Acute on chronic combined systolic and diastolic CHF (congestive heart failure) (HCC)   Hypothyroidism   CKD (chronic kidney disease)  stage 4, GFR 15-29 ml/min (HCC)   Acute on chronic respiratory failure with hypoxia (HCC)   Acute renal failure with acute tubular necrosis superimposed on stage 4 chronic kidney disease (HCC)   Lobar pneumonia (HCC)   Demand ischemia (HCC)   Acute respiratory failure with hypoxia and hypercapnia (HCC)   Goals of care, counseling/discussion   Palliative care by specialist   Advanced care planning/counseling discussion   LOS: 6 days

## 2017-12-31 ENCOUNTER — Inpatient Hospital Stay (HOSPITAL_COMMUNITY): Payer: Medicare Other

## 2017-12-31 DIAGNOSIS — R0602 Shortness of breath: Secondary | ICD-10-CM

## 2017-12-31 LAB — CBC
HCT: 29.2 % — ABNORMAL LOW (ref 36.0–46.0)
Hemoglobin: 9.3 g/dL — ABNORMAL LOW (ref 12.0–15.0)
MCH: 31.6 pg (ref 26.0–34.0)
MCHC: 31.8 g/dL (ref 30.0–36.0)
MCV: 99.3 fL (ref 78.0–100.0)
PLATELETS: 321 10*3/uL (ref 150–400)
RBC: 2.94 MIL/uL — AB (ref 3.87–5.11)
RDW: 15.9 % — AB (ref 11.5–15.5)
WBC: 10.4 10*3/uL (ref 4.0–10.5)

## 2017-12-31 LAB — RENAL FUNCTION PANEL
Albumin: 2.9 g/dL — ABNORMAL LOW (ref 3.5–5.0)
Anion gap: 14 (ref 5–15)
BUN: 52 mg/dL — AB (ref 8–23)
CALCIUM: 10 mg/dL (ref 8.9–10.3)
CHLORIDE: 103 mmol/L (ref 98–111)
CO2: 25 mmol/L (ref 22–32)
CREATININE: 3.99 mg/dL — AB (ref 0.44–1.00)
GFR calc non Af Amer: 10 mL/min — ABNORMAL LOW (ref >60)
GFR, EST AFRICAN AMERICAN: 11 mL/min — AB (ref >60)
Glucose, Bld: 93 mg/dL (ref 70–99)
Phosphorus: 5.1 mg/dL — ABNORMAL HIGH (ref 2.5–4.6)
Potassium: 3.2 mmol/L — ABNORMAL LOW (ref 3.5–5.1)
SODIUM: 142 mmol/L (ref 135–145)

## 2017-12-31 LAB — HEPARIN LEVEL (UNFRACTIONATED): HEPARIN UNFRACTIONATED: 0.51 [IU]/mL (ref 0.30–0.70)

## 2017-12-31 LAB — GLUCOSE, CAPILLARY
GLUCOSE-CAPILLARY: 99 mg/dL (ref 70–99)
Glucose-Capillary: 106 mg/dL — ABNORMAL HIGH (ref 70–99)
Glucose-Capillary: 91 mg/dL (ref 70–99)

## 2017-12-31 MED ORDER — TORSEMIDE 20 MG PO TABS
40.0000 mg | ORAL_TABLET | Freq: Every day | ORAL | Status: DC
  Administered 2018-01-01 – 2018-01-02 (×2): 40 mg via ORAL
  Filled 2017-12-31 (×2): qty 2

## 2017-12-31 MED ORDER — AMIODARONE HCL 200 MG PO TABS
100.0000 mg | ORAL_TABLET | Freq: Every day | ORAL | Status: DC
  Administered 2017-12-31 – 2018-01-02 (×3): 100 mg via ORAL
  Filled 2017-12-31 (×3): qty 1

## 2017-12-31 MED ORDER — TORSEMIDE 20 MG PO TABS: 20.0000 mg | ORAL_TABLET | Freq: Once | ORAL | Status: DC

## 2017-12-31 MED ORDER — METOPROLOL TARTRATE 25 MG PO TABS
25.0000 mg | ORAL_TABLET | Freq: Two times a day (BID) | ORAL | Status: DC
  Administered 2017-12-31 – 2018-01-02 (×5): 25 mg via ORAL
  Filled 2017-12-31 (×5): qty 1

## 2017-12-31 MED ORDER — TORSEMIDE 20 MG PO TABS
40.0000 mg | ORAL_TABLET | Freq: Once | ORAL | Status: AC
  Administered 2017-12-31: 40 mg via ORAL
  Filled 2017-12-31: qty 2

## 2017-12-31 MED ORDER — SENNA 8.6 MG PO TABS: 1.0000 | ORAL_TABLET | Freq: Every day | ORAL | Status: DC | PRN

## 2017-12-31 MED ORDER — POTASSIUM CHLORIDE CRYS ER 20 MEQ PO TBCR
40.0000 meq | EXTENDED_RELEASE_TABLET | Freq: Once | ORAL | Status: AC
  Administered 2017-12-31: 40 meq via ORAL
  Filled 2017-12-31: qty 2

## 2017-12-31 NOTE — Progress Notes (Addendum)
Subjective: [name] was seen resting in her bed this morning on 3L via [place]. She appeared fatigued. She stated that she has been having more shortness of breath than usual.   Objective: Vital signs in last 24 hours: Temp:  [98.1 F (36.7 C)-99.5 F (37.5 C)] 98.4 F (36.9 C) (09/12 0617) Pulse Rate:  [94-104] 104 (09/12 0617) Resp:  [16-22] 16 (09/12 0617) BP: (145-161)/(67-72) 149/70 (09/12 0617) SpO2:  [94 %-98 %] 95 % (09/12 0834) Weight:  [71.8 kg] 71.8 kg (09/11 1255) Weight change:   Intake/Output from previous day: 09/11 0701 - 09/12 0700 In: 38 [I.V.:38] Out: 220 [Urine:220] Intake/Output this shift: No intake/output data recorded.  Physical Exam  Constitutional: She appears well-developed and well-nourished. She appears ill. She appears distressed.  HENT:  Head: Normocephalic and atraumatic.  Eyes: Conjunctivae are normal.  Cardiovascular: Normal rate, regular rhythm and normal heart sounds.  Respiratory: Effort normal. No respiratory distress. She has wheezes.  On 3L [place]  GI: Soft. Bowel sounds are normal. She exhibits no distension. There is no tenderness.  Genitourinary:  Genitourinary Comments: Erythematous area on medial aspect of bilateral thighs adjacent to labia  Musculoskeletal: She exhibits no edema.  Neurological: She is alert.  Skin: She is not diaphoretic.  Psychiatric: She has a normal mood and affect. Her behavior is normal. Judgment and thought content normal.   Lab Results: Recent Labs    12/30/17 0356 12/31/17 0422  WBC 7.2 10.4  HGB 8.5* 9.3*  HCT 27.2* 29.2*  PLT 281 321   BMET:  Recent Labs    12/30/17 0356 12/31/17 0422  NA 141 142  K 3.7 3.2*  CL 105 103  CO2 23 25  GLUCOSE 97 93  BUN 56* 52*  CREATININE 3.97* 3.99*  CALCIUM 9.5 10.0   No results for input(s): PTH in the last 72 hours. Iron Studies:  Recent Labs    12/29/17 0357  IRON 48  TIBC 270  FERRITIN 215   Studies/Results: No results found.  I have  reviewed the patient's current medications.  Assessment/Plan:  Oliguric acute on chronic kidney injury stage IV The patient's creatinine is 3.99 today from prior 3.97. She has put out a total of over the past 24 hrs (not sure if this is accurate as the patient does not have catheter in place). Her potassium is 3.2, phos=5.1, and bicarb=25. She does not have urgent indication for dialysis at this time.  The patient discussed with palliative care that she would like to do dialysis if it is offered to her. There is follow up meeting with family that is scheduled today 9/12 at 10:30am.   Acute on chronic systolic/diastolic heart failure, EF 25-30% Patient is euvolemic on exam without rales, edema, jvp noted on exam. Continue torsemide 20mg  qd. Will increase to torsemide 40mg  starting 9/13.  Bilateral multifocal pneumonia SLP evaluation to check for aspiration issues as she has been having recurrent pneumonia. Continue cefepime, incentive spirometry, flutter valve.   LOS: 7 days   Earon Rivest 12/31/2017,9:13 AM

## 2017-12-31 NOTE — Progress Notes (Signed)
Daily Progress Note   Patient Name: [name]       Date: 12/31/2017 DOB: 1932-09-25  Age: 84 y.o. MRN#: [id number] Attending Physician: Albertine Patricia, MD Primary Care Physician: Jani Gravel, MD Admit Date: 12/24/2017  Reason for Consultation/Follow-up: Establishing goals of care  Subjective: Met with patient's son, brother and patient. Patient's son was at first focused on the fact that patient needed to be seen by her outpatient cardiologist and nephrologist before we could have any discussion of [place].  Patient is alert and oriented and able to participate in her own [place]. The first half of our discussion took place with her involved, however, her involvement was limited due to cough, and by her need for ongoing patient care- the second half of the discussion took place in a separate conference room with her son and her brother only.  Our discussion was very focused on patient's history, illness trajectory, and disease. 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  Shanon Brow states he is uncertain of patient's level of dementia and believes she was put on medication as "precaution because her mother died from dementia".  She has been living in assisted living since she ruptured a patellar tendon several years ago. Her mobility has always been limited due to chronic illnesses, however, she has now not walked independently since June.  A great amount of time was spent answering David's questions related to Tineka's illness trajectory and hospitalization history. We discussed that ongoing heart failure and kidney failure are intertwined and worsen each other. We discussed the process of outpatient dialysis, and how the goals are to improve function and quality of life.  Shanon Brow notes that Thresia had dialysis once in the past and it helped the patient and therefore she is open to it.  We discussed Talisa's ongoing aspiration and pneumonia. We discussed how her limited mobility, continued aspiration, and chronic illnesses contribute to her difficulty recovering from and continued recurrence and worsening of pneumonia.  Code status was discussed.   Review of Systems  Respiratory: Positive for cough and shortness of breath.     Length of Stay: 7  Current Medications: Scheduled Meds:  . amiodarone  100 mg Oral Daily  . feeding supplement  1 Container Oral BID BM  . guaiFENesin  1,200 mg Oral  BID  . insulin aspart  0-9 Units Subcutaneous TID WC  . ipratropium-albuterol  3 mL Nebulization BID  . levothyroxine  100 mcg Per Tube QAC breakfast  . mouth rinse  15 mL Mouth Rinse BID  . metoprolol tartrate  25 mg Oral BID  . [START ON 01/01/2018] torsemide  40 mg Oral Daily    Continuous Infusions: . sodium chloride    . ceFEPime (MAXIPIME) IV 1 g (12/31/17 0539)  . heparin 950 Units/hr (12/31/17 1221)    PRN Meds: sodium chloride, acetaminophen, albuterol, hydrALAZINE, HYDROcodone-acetaminophen, metoprolol tartrate, ondansetron (ZOFRAN) IV, senna  Physical Exam  Constitutional: She appears well-developed.  Frail, ill appearing  Cardiovascular: Normal rate.  Neurological: She is alert.  Nursing note and vitals reviewed.           Vital Signs: BP (!) 150/88 (BP Location: Left Arm)   Pulse 97   Temp 98.6 F (37 C) (Oral)   Resp 18   Ht '5\' 4"'$  (1.626 m)   Wt 70.8 kg   SpO2 97%   BMI 26.79 kg/m  SpO2: SpO2: 97 % O2 Device: O2 Device: Nasal Cannula O2 Flow Rate: O2 Flow Rate (L/min): 3 L/min  Intake/output summary:   Intake/Output Summary (Last 24 hours) at 12/31/2017 1712 Last data filed at 12/31/2017 1520 Gross per 24 hour  Intake 460 ml  Output 300 ml  Net 160 ml   LBM: Last BM Date: 12/29/17 Baseline Weight: Weight: 72 kg Most recent  weight: Weight: 70.8 kg       Palliative Assessment/Data: PPS: 20%      Patient Active Problem List   Diagnosis Date Noted  . Acute respiratory failure with hypoxia and hypercapnia (HCC)   . Goals of care, counseling/discussion   . Palliative care by specialist   . Advanced care planning/counseling discussion   . Lobar pneumonia ([place]) 12/28/2017  . Demand ischemia ([place]) 12/28/2017  . Acute renal failure with acute tubular necrosis superimposed on stage 4 chronic kidney disease ([place])   . Pneumonia due to infectious organism 12/18/2017  . Elevated brain natriuretic peptide (BNP) level   . SOB (shortness of breath)   . Elevated troponin 11/07/2017  . Anticoagulation management encounter 09/29/2016  . Acute on chronic respiratory failure with hypoxia ([place]) 07/21/2016  . Head contusion 07/15/2016  . Fall 07/14/2016  . Unstable angina ([place])   . Elevated blood sugar 06/13/2016  . Dementia without behavioral disturbance 01/09/2016  . Chronic constipation 01/09/2016  . Neuropathic pain 01/09/2016  . Esophageal reflux 01/09/2016  . Hypothyroidism 01/09/2016  . CKD (chronic kidney disease) stage 4, GFR 15-29 ml/min (HCC) 01/09/2016  . Right patellar tendon rupture 06/25/2015  . Avulsion of right patellar tendon 03/08/2015  . Anemia of chronic disease 12/14/2014  . Swallowing dysfunction 12/14/2014  . S/P right TKA 12/04/2014  . S/P knee replacement 12/04/2014  . Thrombocytopenia ([place]) 12/28/2012  . Toe fracture 12/28/2012  . Low back pain 12/28/2012  . Wound dehiscence, surgical 12/28/2012  . S/P TAVR (transcatheter aortic valve replacement) 12/21/2012  . Severe aortic stenosis 12/06/2012  . Aortic stenosis 10/13/2012  . Acute on chronic combined systolic and diastolic CHF (congestive heart failure) ([place]) 10/13/2012  . Pacemaker 10/15/2010  . Paroxysmal atrial fibrillation ([place]) 10/15/2010  . Dyslipidemia 10/15/2010  . Hypertension 10/15/2010    Palliative Care Assessment &  Plan   Patient Profile: 84 y.o. female  with past medical history of CHF, COPD on chronic O2 with multiple recent hospitalizations for exacerbations and  pneumonia, CKD, severe aortic stenosis s/p TAVR, hx CVA, dementia admitted on 12/24/2017 with acute respiratory failure requiring intubation-extubated 9/7 - workup reveals pneumonia, CHF exacerbation, AKI on CKD-IV, anemia of CKD. Palliative medicine consulted for [place] and discussion re: possible dialysis decision.  Assessment/Recommendations/Plan   Continue current care- no changes to scope or code status  Patient's son requests PMT followup with him tomorrow by phone for continued [place] discussion  Goals of Care and Additional Recommendations:  Limitations on Scope of Treatment: Full Scope Treatment  Code Status:  Limited code  Prognosis:   Unable to determine  Discharge Planning:  To Be Determined  Care plan was discussed with patient and her son and brother.  Thank you for allowing the Palliative Medicine Team to assist in the care of this patient.   Time In: 1045 Time Out: 1115 Total Time 75 mins Prolonged Time Billed Yes      Greater than 50%  of this time was spent counseling and coordinating care related to the above assessment and plan.  Mariana Kaufman, AGNP-C Palliative Medicine   Please contact Palliative Medicine Team phone at [phone] for questions and concerns.

## 2017-12-31 NOTE — Progress Notes (Signed)
PROGRESS NOTE  [name] ZOX:[id number] DOB: May 25, 1932 DOA: 12/24/2017 PCP: Pearson Grippe, MD  Brief Narrative: 84 year old woman complicated PMH including oxygen dependent COPD, systolic CHF, paroxysmal atrial fibrillation, CKD, pulmonary embolism, presented with acute shortness of breath, failed BiPAP after vomiting he was intubated in the emergency department and admitted by critical care.  She was a treated for acute pulmonary edema, acute CHF, COPD and was subsequently extubated, also treated for suspected bilateral pneumonia.  Renal function continued to worsen and at this point dialysis may be needed in the future.   Subjective -Does report worsening cough today.  Assessment/Plan  Acute on chronic systolic/diastolic CHF -Patient with known EF 25 to 30% on echo July 2019, she presents with respiratory failure, requiring intubation in the setting of pneumonia and volume overload. -Worsening renal function remains the most limiting factor for her diuresis, diuresis has been held recently due to worsened renal function, she is back on torsemide yesterday and today, chest x-ray this morning with worsening pulmonary edema, torsemide has been increased to 40, continue to monitor renal panel daily. -We will chest PT is of great importance, she was encouraged again today to use incentive spirometry, flutter valve, she is on Mucinex as well.  Acute on chronic hypoxic respiratory failure,  -Appears to be multifactorial, in the setting of baseline COPD, CHF, with multifocal pneumonia on admission required intubation in ED, she is currently back on 3 L nasal cannula . -Is been followed by SLP regarding recurrent pneumonia and suspicion for aspiration, further work-up as discussed with SLP depends on palliative care and goals of care discussion . -Is with worsening cough today, this is most likely in the setting of volume overload, been increased  AKI on CKD stage IV thought to be hemodynamically  mediated in the setting of CHF and poor renal perfusion.  Nonoliguric. --Is been followed by renal, diuresis per them,   Multifocal pneumonia -required  intubation on admission, today she finished 7 days of cefepime  Demand ischemia, no further ischemic work-up per cardiology.  DM type 2 --Stable.  Continue sliding scale insulin.  Severe aortic stenosis status post TAVR 2014 --Felt to be working properly per cardiology  PAF on warfarin, status post dual-chamber pacemaker --On hold, continue with warfarin  Hypothyroidism. --Levothyroxine was adjusted to 100 mcg since admission  Dementia  Aortic atherosclerosis   Worsening renal function.  Continue management per nephrology and cardiology.    Palliative medicine consultation pending.   DVT prophylaxis: heparin infusion Code Status: partial Family Communication: none Disposition Plan: pending    Huey Bienenstock, MD  [facility] Direct contact: [phone] --Via amion app OR  --[email or web address]; password [email or web address]  7PM-7AM contact night coverage as above 12/31/2017, 3:05 PM  LOS: 7 days   Consultants:  Cardiology  PCCM  Nephrology   Procedures:  ETT  Antimicrobials:  Cefepime     Objective: Vitals:  Vitals:   12/31/17 1156 12/31/17 1300  BP: (!) 150/88   Pulse: 97   Resp:    Temp:  98.6 F (37 C)  SpO2:      Exam:  Awake Alert, Oriented X 3, No new F.N deficits, Normal affect, in some discomfort secondary to cough Symmetrical Chest wall movement, Good air movement bilaterally, bibasilar crackles RRR,No Gallops,Rubs or new Murmurs, No Parasternal Heave +ve B.Sounds, Abd Soft, No tenderness, No rebound - guarding or rigidity. No Cyanosis, Clubbing or edema, No new Rash or bruise     I have personally reviewed the following:  Data: . Blood sugars stable. . BUN 58, creatinine 4.12.  Potassium within normal limits.  Scheduled Meds: . amiodarone  100 mg Oral Daily  . feeding supplement   1 Container Oral BID BM  . guaiFENesin  1,200 mg Oral BID  . insulin aspart  0-9 Units Subcutaneous TID WC  . ipratropium-albuterol  3 mL Nebulization BID  . levothyroxine  100 mcg Per Tube QAC breakfast  . mouth rinse  15 mL Mouth Rinse BID  . metoprolol tartrate  25 mg Oral BID  . [START ON 01/01/2018] torsemide  40 mg Oral Daily   Continuous Infusions: . sodium chloride    . ceFEPime (MAXIPIME) IV 1 g (12/31/17 0539)  . heparin 950 Units/hr (12/31/17 1221)    Active Problems:   Acute on chronic combined systolic and diastolic CHF (congestive heart failure) (HCC)   Hypothyroidism   CKD (chronic kidney disease) stage 4, GFR 15-29 ml/min (HCC)   Acute on chronic respiratory failure with hypoxia (HCC)   Acute renal failure with acute tubular necrosis superimposed on stage 4 chronic kidney disease (HCC)   Lobar pneumonia (HCC)   Demand ischemia (HCC)   Acute respiratory failure with hypoxia and hypercapnia (HCC)   Goals of care, counseling/discussion   Palliative care by specialist   Advanced care planning/counseling discussion   LOS: 7 days

## 2017-12-31 NOTE — Progress Notes (Addendum)
Progress Note  Patient Name: [name] Date of Encounter: 12/31/2017  Primary Cardiologist: Tonny Bollman, MD   Subjective   Patient reported some improvement in breathing since yesterday. She had a coughing fit while examining her with significant difficulty bringing up sputum. She denies chest pain or palpitations.   Inpatient Medications    Scheduled Meds: . feeding supplement  1 Container Oral BID BM  . guaiFENesin  1,200 mg Oral BID  . insulin aspart  0-9 Units Subcutaneous TID WC  . ipratropium-albuterol  3 mL Nebulization BID  . levothyroxine  100 mcg Per Tube QAC breakfast  . mouth rinse  15 mL Mouth Rinse BID  . potassium chloride  40 mEq Oral Once  . torsemide  20 mg Oral Daily   Continuous Infusions: . sodium chloride    . ceFEPime (MAXIPIME) IV 1 g (12/31/17 0539)  . heparin 950 Units/hr (12/30/17 0526)   PRN Meds: sodium chloride, acetaminophen, albuterol, hydrALAZINE, HYDROcodone-acetaminophen, metoprolol tartrate, ondansetron (ZOFRAN) IV   Vital Signs    Vitals:   12/30/17 2057 12/30/17 2219 12/31/17 0616 12/31/17 0617  BP:  (!) 145/67 (!) 161/72 (!) 149/70  Pulse: 94 97 (!) 101 (!) 104  Resp: 20 18 18 16   Temp:  99.5 F (37.5 C) 98.1 F (36.7 C) 98.4 F (36.9 C)  TempSrc:  Axillary Oral Oral  SpO2: 95% 98% 94%   Weight:      Height:        Intake/Output Summary (Last 24 hours) at 12/31/2017 0824 Last data filed at 12/30/2017 1244 Gross per 24 hour  Intake 28.5 ml  Output 220 ml  Net -191.5 ml   Filed Weights   12/28/17 0507 12/29/17 0216 12/30/17 1255  Weight: 75.3 kg 71.5 kg 71.8 kg    Telemetry    Atrial fibrillation with poorly controlled rates this morning, up to 130s. - Personally Reviewed  Physical Exam   GEN: Chronically ill appearing lady with some distress with coughing.   Neck: No JVD, no carotid bruits Cardiac: IRIR, no murmurs, rubs, or gallops.  Respiratory: crackles at lung bases bilaterally; significant  difficulty coughing up sputum requiring suctioning.  GI: NABS, Soft, nontender, non-distended  MS: No edema; No deformity. Neuro:  Nonfocal, moving all extremities spontaneously Psych: Normal affect   Labs    Chemistry Recent Labs  Lab 12/24/17 1023  12/29/17 0357 12/30/17 0356 12/31/17 0422  NA 142   < > 142 141 142  K 3.2*   < > 3.7 3.7 3.2*  CL 99   < > 104 105 103  CO2 27   < > 25 23 25   GLUCOSE 156*   < > 94 97 93  BUN 70*   < > 58* 56* 52*  CREATININE 2.74*   < > 4.12* 3.97* 3.99*  CALCIUM 9.5   < > 9.3 9.5 10.0  PROT 5.6*  --   --   --   --   ALBUMIN 3.0*  --  2.7*  --  2.9*  AST 33  --   --   --   --   ALT 16  --   --   --   --   ALKPHOS 66  --   --   --   --   BILITOT 1.0  --   --   --   --   GFRNONAA 15*   < > 9* 10* 10*  GFRAA 17*   < > 11* 11* 11*  ANIONGAP 16*   < > 13 13 14    < > = values in this interval not displayed.     Hematology Recent Labs  Lab 12/28/17 0947 12/30/17 0356 12/31/17 0422  WBC 10.1 7.2 10.4  RBC 2.87* 2.76* 2.94*  HGB 8.9* 8.5* 9.3*  HCT 28.5* 27.2* 29.2*  MCV 99.3 98.6 99.3  MCH 31.0 30.8 31.6  MCHC 31.2 31.3 31.8  RDW 15.7* 15.5 15.9*  PLT 306 281 321    Cardiac Enzymes Recent Labs  Lab 12/24/17 1023 12/24/17 1358 12/24/17 2055  TROPONINI 0.17* 0.20* 0.18*   No results for input(s): TROPIPOC in the last 168 hours.   BNP Recent Labs  Lab 12/24/17 1358  BNP 779.1*     DDimer No results for input(s): DDIMER in the last 168 hours.   Radiology    No results found.  Cardiac Studies   Echocardiogram 10/2017: Study Conclusions  - Left ventricle: The cavity size was moderately dilated. Wall   thickness was increased in a pattern of mild LVH. Systolic   function was severely reduced. The estimated ejection fraction   was in the range of 25% to 30%. Diffuse hypokinesis, with   akinesis of specific walls below. Features are consistent with a   pseudonormal left ventricular filling pattern, with concomitant    abnormal relaxation and increased filling pressure (grade 2   diastolic dysfunction). - Regional wall motion abnormality: Akinesis of the mid   anteroseptal, basal-mid inferoseptal, and mid inferior   myocardium; hypokinesis of the mid anterior, basal anteroseptal,   apical septal, apical lateral, and apical myocardium. - Aortic valve: A prosthesis was present and functioning normally.   The prosthesis had a normal range of motion. The sewing ring   appeared normal, had no rocking motion, and showed no evidence of   dehiscence. There was mild regurgitation. Mean gradient (S): 11   mm Hg. Peak gradient (S): 18 mm Hg. Valve area (VTI): 1.91 cm^2.   Valve area (Vmax): 1.73 cm^2. Valve area (Vmean): 1.71 cm^2. - Mitral valve: Mobility was restricted. There was mild   regurgitation directed eccentrically. - Left atrium: The atrium was moderately dilated. - Atrial septum: No defect or patent foramen ovale was identified. - Tricuspid valve: There was mild regurgitation. - Pulmonic valve: There was mild regurgitation. - Pulmonary arteries: PA peak pressure: 32 mm Hg (S).  Impressions:  - LV function appears worse from prior. TAVR in place, with   gradients slightly elevated from prior (peak 18, mean 11, AVA   1.91). Valvular AI also worse compared to prior.  Patient Profile     84 y.o. female with a hx of NICM,chronic systolic and diastolic heart failure 9EF 25-30% with grade 2 diastolic dysfunction),morbid obesity,severe AS s/p TAVR 2014, paroxsymal atrial fibrillationon Coumadin, HTN, CKD stage IV,COPD withchronic respiratory failure onhomeO2, OSA, bradycardia s/p pacemakerwho is being followed by cardiology for the evaluation ofCHF  Assessment & Plan    1. Acute on chronic systolic CHF: EF 16-10% on echo 10/2017. She presented with acute on chronic respiratory failure. Has had multiple admissions recently for CHF. She was initially diuresed with IV lasix, however Cr bumped (peak  4.12) so diuretics were held. She was restarted on torsemide yesterday by nephrology and Cr is 3.99 today. Palliative care was consulted for GOC discussion - patient is open to dialysis if offered. She is maintaining a net nil/negative volume status but would benefit from more diuresis. Weight is somewhat labile throughout admission but was 158lbs  on admission and is 158lbs today.  - Continue diuresis per nephrology - Continue to monitor strict I&Os and daily weights   2. Paroxysmal atrial fibrillation: home amiodarone, metoprolol and warfarin held on admission. She is on a heparin gtt for stroke ppx.  - Will restart metoprolol 25mg  BID for better HR control - Will restart amiodarone - Continue heparin gtt for now - anticipate transition back to warfarin prior to discharge - Continue to monitor on telemetry  3. Severe AS s/p TAVR: mild AR, slightly worse hemodynamic gradients, overall stable on echo 10/2017  4. Elevated troponin: 2/2 demand ischemia - No further ischemic work-up at this time  5. CKD stage 4: Baseline Cr ~2.4; peaked at 4.12 this admission after diuresis. Nephrology following. Palliative care discussed GOC with patient who is open to dialysis if offered.  - Continue to monitor Cr closely  - Continue management per nephrology and primary team  6. Anemia of CKD: Hgb dropped to 8.5 this admission. Started on IV iron. Hgb stable at 9.3 today - Continue to monitor Hgb closely  For questions or updates, please contact [facility] Please consult [email or web address] for contact info under Cardiology/STEMI.     Signed, Beatriz Stallion, PA-C  12/31/2017, 8:24 AM   [phone]  The patient was seen, examined and discussed with Beatriz Stallion, PA-C  and I agree with the above.   Worsening as of breath and coughing this morning, the patient is being followed by nephrology for acute on chronic kidney twice daily if okay with nephrology team.  She denies any chest pain. I agree  with palliative care approach, this is an elderly patient with multiorgan failure, CERT readmission within the last 2 months, very uncomfortable at baseline. She is open to an option of hemodialysis.  Tobias Alexander, MD 12/31/2017

## 2017-12-31 NOTE — Progress Notes (Signed)
Physical Therapy Treatment Patient Details Name: [name] MRN: [id number] DOB: Oct 03, 1932 Today's Date: 12/31/2017    History of Present Illness 84 year old female with extensive past medical history that is includes but not limited to O2 dependent COPD, morbid obesity, congestive heart failure with a EF of 20%, aortic stenosis severe, paroxysmal atrial fibrillation on Coumadin, dementia, chronic kidney disease with a base creatinine of 2.2, history of pulmonary embolism, who was recently discharged from [facility] on 12/07/2017 for similar episode of acute shortness of breath responding to Lasix. She returned to [facility] emergency department on 12/24/2017 again with acute shortness of breath along with thick sputum she was placed on BiPAP did not tolerate it developed frothy blood-tinged sputum; Intubated 9/5-9/7    PT Comments    Pt making slow but steady progress toward improving mobility.   Follow Up Recommendations  SNF;Supervision/Assistance - 24 hour     Equipment Recommendations  None recommended by PT    Recommendations for Other Services       Precautions / Restrictions Precautions Precautions: Fall Precaution Comments: Noted in chart review she has a RLE brace -- brace was not in room on PT eval    Mobility  Bed Mobility Overal bed mobility: Needs Assistance Bed Mobility: Supine to Sit     Supine to sit: Mod assist     General bed mobility comments: Assist to elevate trunk and bring hips to EOB  Transfers Overall transfer level: Needs assistance Equipment used: Rolling walker (2 wheeled);Ambulation equipment used Transfers: Sit to/from Stand Sit to Stand: Mod assist;+2 safety/equipment Stand pivot transfers: (Used Stedy)       General transfer comment: Assist to bring hips up and for balance. Verbal cues for hand placement  Ambulation/Gait Ambulation/Gait assistance: Mod assist;+2 safety/equipment Gait Distance (Feet): 3 Feet Assistive  device: Rolling walker (2 wheeled) Gait Pattern/deviations: Step-to pattern;Decreased step length - right;Decreased step length - left;Shuffle;Trunk flexed Gait velocity: decr Gait velocity interpretation: <1.8 ft/sec, indicate of risk for recurrent falls General Gait Details: Assist for balance and support. Recliner brought behind pt by second person   Stairs             Wheelchair Mobility    Modified Rankin (Stroke Patients Only)       Balance Overall balance assessment: Needs assistance Sitting-balance support: No upper extremity supported;Feet supported Sitting balance-Leahy Scale: Fair     Standing balance support: Bilateral upper extremity supported Standing balance-Leahy Scale: Poor Standing balance comment: walker or stedy and min assist for static standing                            Cognition Arousal/Alertness: Awake/alert Behavior During Therapy: WFL for tasks assessed/performed Overall Cognitive Status: Within Functional Limits for tasks assessed(for mobility)                                        Exercises      General Comments        Pertinent Vitals/Pain Pain Assessment: No/denies pain    Home Living                      Prior Function            PT Goals (current goals can now be found in the care plan section) Progress towards PT goals: Progressing toward  goals    Frequency    Min 2X/week      PT Plan Current plan remains appropriate    Co-evaluation              AM-PAC PT "6 Clicks" Daily Activity  Outcome Measure  Difficulty turning over in bed (including adjusting bedclothes, sheets and blankets)?: A Lot Difficulty moving from lying on back to sitting on the side of the bed? : Unable Difficulty sitting down on and standing up from a chair with arms (e.g., wheelchair, bedside commode, etc,.)?: Unable Help needed moving to and from a bed to chair (including a wheelchair)?: A  Lot Help needed walking in hospital room?: A Lot Help needed climbing 3-5 steps with a railing? : Total 6 Click Score: 9    End of Session Equipment Utilized During Treatment: Gait belt;Oxygen Activity Tolerance: Patient limited by fatigue Patient left: in chair;with call bell/phone within reach;with chair alarm set;with nursing/sitter in room(nursing student) Nurse Communication: Mobility status;Need for lift equipment(Can use stedy if needed) PT Visit Diagnosis: Muscle weakness (generalized) (M62.81);Pain;History of falling (Z91.81)     Time: 2197-5883 PT Time Calculation (min) (ACUTE ONLY): 21 min  Charges:  $Therapeutic Activity: 8-22 mins                     [facility] PT Acute Rehabilitation Services Pager [phone] Office [phone]    [name] [facility] 12/31/2017, 2:10 PM

## 2017-12-31 NOTE — Progress Notes (Signed)
ANTICOAGULATION CONSULT NOTE - Follow Up Consult  Pharmacy Consult for heparin Indication: atrial fibrillation  Allergies  Allergen Reactions  . Nubain [Nalbuphine Hcl] Hives    Went into cardiac arrest   . Codeine Hives and Nausea Only    Has tolerated Oxycodone  . Darvocet [Propoxyphene N-Acetaminophen] Nausea Only    Patient Measurements: Height: 5\' 4"  (162.6 cm) Weight: 158 lb 4.6 oz (71.8 kg) IBW/kg (Calculated) : 54.7 Heparin Dosing Weight: 61.4  Vital Signs: Temp: 98.4 F (36.9 C) (09/12 0617) Temp Source: Oral (09/12 0617) BP: 149/70 (09/12 0617) Pulse Rate: 104 (09/12 0617)  Labs: Recent Labs    12/28/17 0947 12/29/17 0357 12/30/17 0356 12/30/17 1451 12/31/17 0422  HGB 8.9*  --  8.5*  --  9.3*  HCT 28.5*  --  27.2*  --  29.2*  PLT 306  --  281  --  321  HEPARINUNFRC 0.39 0.39 <0.10* 0.35 0.51  CREATININE 3.95* 4.12* 3.97*  --  3.99*    Estimated Creatinine Clearance: 10.4 mL/min (A) (by C-G formula based on SCr of 3.99 mg/dL (H)).   Medications:  Scheduled:  . feeding supplement  1 Container Oral BID BM  . guaiFENesin  1,200 mg Oral BID  . insulin aspart  0-9 Units Subcutaneous TID WC  . ipratropium-albuterol  3 mL Nebulization BID  . levothyroxine  100 mcg Per Tube QAC breakfast  . mouth rinse  15 mL Mouth Rinse BID  . potassium chloride  40 mEq Oral Once  . torsemide  20 mg Oral Daily    Assessment: 84 yo female with pAF (on warfarin PTA) and TAVR (2014) presenting with respiratory distress and CHF exacerbation. Pharmacy has been consulted to dose heparin.  Heparin level therapeutic this morning at 0.51  Hgb 9.3 today. No bleeding noted. Will continue with heparin 950 units / hr.     Goal of Therapy:  Heparin level 0.3-0.7 units/ml Monitor platelets by anticoagulation protocol: Yes   Plan:  Continue heparin infusion at 950 units/hr Check anti-Xa level daily while on heparin Continue to monitor H&H and platelets Follow-up restart of  warfarin   Thank you for allowing pharmacy to be a part of this patient's care.  Bradley Ferris, PharmD 12/31/2017 7:53 AM PGY-1 Pharmacy Resident Direct Phone: [phone] Please check [email or web address] for unit-specific pharmacist phone numbers

## 2018-01-01 DIAGNOSIS — Z992 Dependence on renal dialysis: Secondary | ICD-10-CM

## 2018-01-01 DIAGNOSIS — N186 End stage renal disease: Secondary | ICD-10-CM

## 2018-01-01 DIAGNOSIS — Z66 Do not resuscitate: Secondary | ICD-10-CM

## 2018-01-01 LAB — RENAL FUNCTION PANEL
ANION GAP: 15 (ref 5–15)
Albumin: 3 g/dL — ABNORMAL LOW (ref 3.5–5.0)
BUN: 50 mg/dL — ABNORMAL HIGH (ref 8–23)
CALCIUM: 9.9 mg/dL (ref 8.9–10.3)
CHLORIDE: 101 mmol/L (ref 98–111)
CO2: 23 mmol/L (ref 22–32)
CREATININE: 4 mg/dL — AB (ref 0.44–1.00)
GFR calc Af Amer: 11 mL/min — ABNORMAL LOW (ref >60)
GFR, EST NON AFRICAN AMERICAN: 9 mL/min — AB (ref >60)
Glucose, Bld: 97 mg/dL (ref 70–99)
Phosphorus: 4.6 mg/dL (ref 2.5–4.6)
Potassium: 3.2 mmol/L — ABNORMAL LOW (ref 3.5–5.1)
Sodium: 139 mmol/L (ref 135–145)

## 2018-01-01 LAB — GLUCOSE, CAPILLARY
GLUCOSE-CAPILLARY: 93 mg/dL (ref 70–99)
Glucose-Capillary: 122 mg/dL — ABNORMAL HIGH (ref 70–99)
Glucose-Capillary: 94 mg/dL (ref 70–99)

## 2018-01-01 LAB — HEPARIN LEVEL (UNFRACTIONATED): HEPARIN UNFRACTIONATED: 0.41 [IU]/mL (ref 0.30–0.70)

## 2018-01-01 MED ORDER — WARFARIN - PHARMACIST DOSING INPATIENT: Freq: Every day | Status: DC

## 2018-01-01 MED ORDER — POTASSIUM CHLORIDE CRYS ER 20 MEQ PO TBCR
40.0000 meq | EXTENDED_RELEASE_TABLET | Freq: Once | ORAL | Status: AC
  Administered 2018-01-01: 40 meq via ORAL
  Filled 2018-01-01: qty 2

## 2018-01-01 MED ORDER — WARFARIN SODIUM 3 MG PO TABS
3.0000 mg | ORAL_TABLET | Freq: Once | ORAL | Status: AC
  Administered 2018-01-01: 3 mg via ORAL
  Filled 2018-01-01: qty 1

## 2018-01-01 NOTE — Progress Notes (Signed)
[facility] aware that patient may be discharging back there with Hospice services tomorrow.   Osborne Casco Madelon Welsch LCSW [phone]

## 2018-01-01 NOTE — Progress Notes (Signed)
Progress Note  Patient Name: [name] Date of Encounter: 01/01/2018  Primary Cardiologist: Tonny Bollman, MD   Subjective   She seems to be more comfortable this morning, not coughing, laying flat in bed.   Inpatient Medications    Scheduled Meds: . amiodarone  100 mg Oral Daily  . feeding supplement  1 Container Oral BID BM  . guaiFENesin  1,200 mg Oral BID  . insulin aspart  0-9 Units Subcutaneous TID WC  . ipratropium-albuterol  3 mL Nebulization BID  . levothyroxine  100 mcg Per Tube QAC breakfast  . mouth rinse  15 mL Mouth Rinse BID  . metoprolol tartrate  25 mg Oral BID  . torsemide  40 mg Oral Daily   Continuous Infusions: . sodium chloride    . heparin 950 Units/hr (12/31/17 1221)   PRN Meds: sodium chloride, acetaminophen, albuterol, hydrALAZINE, HYDROcodone-acetaminophen, metoprolol tartrate, ondansetron (ZOFRAN) IV, senna   Vital Signs    Vitals:   12/31/17 1500 12/31/17 2026 12/31/17 2035 01/01/18 0635  BP:  (!) 151/67  (!) 150/81  Pulse:  86  (!) 105  Resp:  20  18  Temp:  98.3 F (36.8 C)  98.1 F (36.7 C)  TempSrc:      SpO2:  98% 97% 98%  Weight: 70.8 kg   71.1 kg  Height:        Intake/Output Summary (Last 24 hours) at 01/01/2018 0744 Last data filed at 12/31/2017 1947 Gross per 24 hour  Intake 760 ml  Output 300 ml  Net 460 ml   Filed Weights   12/30/17 1255 12/31/17 1500 01/01/18 0635  Weight: 71.8 kg 70.8 kg 71.1 kg    Telemetry    Atrial fibrillation with poorly controlled rates this morning, up to 130s. - Personally Reviewed  Physical Exam   GEN: NAD  Neck: No JVD, no carotid bruits Cardiac: IRIR, no murmurs, rubs, or gallops.  Respiratory: crackles at lung bases bilaterally; significant difficulty coughing up sputum requiring suctioning.  GI: NABS, Soft, nontender, non-distended  MS: No edema; No deformity. Neuro:  Nonfocal, moving all extremities spontaneously Psych: Normal affect   Labs     Chemistry Recent Labs  Lab 12/29/17 0357 12/30/17 0356 12/31/17 0422  NA 142 141 142  K 3.7 3.7 3.2*  CL 104 105 103  CO2 25 23 25   GLUCOSE 94 97 93  BUN 58* 56* 52*  CREATININE 4.12* 3.97* 3.99*  CALCIUM 9.3 9.5 10.0  ALBUMIN 2.7*  --  2.9*  GFRNONAA 9* 10* 10*  GFRAA 11* 11* 11*  ANIONGAP 13 13 14      Hematology Recent Labs  Lab 12/28/17 0947 12/30/17 0356 12/31/17 0422  WBC 10.1 7.2 10.4  RBC 2.87* 2.76* 2.94*  HGB 8.9* 8.5* 9.3*  HCT 28.5* 27.2* 29.2*  MCV 99.3 98.6 99.3  MCH 31.0 30.8 31.6  MCHC 31.2 31.3 31.8  RDW 15.7* 15.5 15.9*  PLT 306 281 321    Cardiac Enzymes No results for input(s): TROPONINI in the last 168 hours. No results for input(s): TROPIPOC in the last 168 hours.   BNP No results for input(s): BNP, PROBNP in the last 168 hours.   DDimer No results for input(s): DDIMER in the last 168 hours.   Radiology    Dg Chest Port 1 View  Result Date: 12/31/2017 CLINICAL DATA:  Shortness of breath EXAM: PORTABLE CHEST 1 VIEW COMPARISON:  12/27/2017 FINDINGS: Right pacer remains in place, unchanged. Prior aortic valve repair. Cardiomegaly.  Worsening bilateral interstitial and alveolar opacities, likely edema. More confluent airspace opacity at the left base could reflect atelectasis or infiltrate. Small left effusion. IMPRESSION: Worsening interstitial and alveolar opacities compatible with worsening edema. Small left effusion with left base atelectasis or infiltrate. Electronically Signed   By: Charlett Nose M.D.   On: 12/31/2017 09:48    Cardiac Studies   Echocardiogram 10/2017: Study Conclusions  - Left ventricle: The cavity size was moderately dilated. Wall   thickness was increased in a pattern of mild LVH. Systolic   function was severely reduced. The estimated ejection fraction   was in the range of 25% to 30%. Diffuse hypokinesis, with   akinesis of specific walls below. Features are consistent with a   pseudonormal left ventricular  filling pattern, with concomitant   abnormal relaxation and increased filling pressure (grade 2   diastolic dysfunction). - Regional wall motion abnormality: Akinesis of the mid   anteroseptal, basal-mid inferoseptal, and mid inferior   myocardium; hypokinesis of the mid anterior, basal anteroseptal,   apical septal, apical lateral, and apical myocardium. - Aortic valve: A prosthesis was present and functioning normally.   The prosthesis had a normal range of motion. The sewing ring   appeared normal, had no rocking motion, and showed no evidence of   dehiscence. There was mild regurgitation. Mean gradient (S): 11   mm Hg. Peak gradient (S): 18 mm Hg. Valve area (VTI): 1.91 cm^2.   Valve area (Vmax): 1.73 cm^2. Valve area (Vmean): 1.71 cm^2. - Mitral valve: Mobility was restricted. There was mild   regurgitation directed eccentrically. - Left atrium: The atrium was moderately dilated. - Atrial septum: No defect or patent foramen ovale was identified. - Tricuspid valve: There was mild regurgitation. - Pulmonic valve: There was mild regurgitation. - Pulmonary arteries: PA peak pressure: 32 mm Hg (S).  Impressions:  - LV function appears worse from prior. TAVR in place, with   gradients slightly elevated from prior (peak 18, mean 11, AVA   1.91). Valvular AI also worse compared to prior.  Patient Profile     84 y.o. female with a hx of NICM,chronic systolic and diastolic heart failure 9EF 25-30% with grade 2 diastolic dysfunction),morbid obesity,severe AS s/p TAVR 2014, paroxsymal atrial fibrillationon Coumadin, HTN, CKD stage IV,COPD withchronic respiratory failure onhomeO2, OSA, bradycardia s/p pacemakerwho is being followed by cardiology for the evaluation ofCHF  Assessment & Plan    1. Acute on chronic systolic CHF: EF 16-10% on echo 10/2017. She presented with acute on chronic respiratory failure. Has had multiple admissions recently for CHF. She was initially diuresed  with IV lasix, however Cr bumped (peak 4.12) so diuretics were held. She was restarted on torsemide yesterday by nephrology and Cr is 3.99 today. Palliative care was consulted for GOC discussion - patient is open to dialysis if offered. She is maintaining a net nil/negative volume status, weight is stable, Crea remains at 3.9. Nephrology is managing. I agree with palliative care approach, this is an elderly patient with multiorgan failure, CERT readmission within the last 2 months, very uncomfortable at baseline. She is open to an option of hemodialysis.  2. Paroxysmal atrial fibrillation: home amiodarone, metoprolol and warfarin held on admission. She is on a heparin gtt for stroke ppx.  - Will increase metoprolol to 50 mg BID for better HR control - Will continue amiodarone - Continue heparin gtt for now - anticipate transition back to warfarin prior to discharge - Continue to monitor on telemetry  3. Severe AS s/p TAVR: mild AR, slightly worse hemodynamic gradients, overall stable on echo 10/2017  4. Elevated troponin: 2/2 demand ischemia - No further ischemic work-up at this time  5. CKD stage 4: Baseline Cr ~2.4; peaked at 4.12 this admission after diuresis. Nephrology following. Palliative care discussed GOC with patient who is open to dialysis if offered.  - Continue to monitor Cr closely  - Continue management per nephrology and primary team  6. Anemia of CKD: Hgb dropped to 8.5 this admission. Started on IV iron. Hgb stable at 9.3 today - Continue to monitor Hgb closely  Tobias Alexander, MD 01/01/2018

## 2018-01-01 NOTE — Progress Notes (Signed)
Subjective: [name] was seen laying in her bed comfortably this morning. She was receiving a breathing treatment when I was evaluating her. She stated that her breathing felt the same as yesterday.  Objective: Vital signs in last 24 hours: Temp:  [98.1 F (36.7 C)-98.6 F (37 C)] 98.1 F (36.7 C) (09/13 0635) Pulse Rate:  [86-134] 105 (09/13 0635) Resp:  [18-20] 18 (09/13 0635) BP: (140-151)/(57-88) 150/81 (09/13 0635) SpO2:  [95 %-98 %] 98 % (09/13 0635) Weight:  [70.8 kg-71.1 kg] 71.1 kg (09/13 0635) Weight change: -1 kg  Intake/Output from previous day: 09/12 0701 - 09/13 0700 In: 760 [P.O.:760] Out: 300 [Urine:200; Stool:100] Intake/Output this shift: No intake/output data recorded.  Physical Exam  Constitutional: She appears well-developed and well-nourished. No distress.  HENT:  Head: Normocephalic and atraumatic.  Eyes: Conjunctivae are normal.  Cardiovascular: Normal rate, regular rhythm and normal heart sounds.  Respiratory: Effort normal and breath sounds normal. No respiratory distress. She has no wheezes.  On 3L via [place]  GI: Soft. Bowel sounds are normal. She exhibits no distension. There is no tenderness.  Musculoskeletal: She exhibits no edema.  Neurological: She is alert.  Skin: She is not diaphoretic.  Psychiatric: She has a normal mood and affect. Her behavior is normal. Judgment and thought content normal.   Lab Results: Recent Labs    12/30/17 0356 12/31/17 0422  WBC 7.2 10.4  HGB 8.5* 9.3*  HCT 27.2* 29.2*  PLT 281 321   BMET:  Recent Labs    12/30/17 0356 12/31/17 0422  NA 141 142  K 3.7 3.2*  CL 105 103  CO2 23 25  GLUCOSE 97 93  BUN 56* 52*  CREATININE 3.97* 3.99*  CALCIUM 9.5 10.0   No results for input(s): PTH in the last 72 hours. Iron Studies: No results for input(s): IRON, TIBC, TRANSFERRIN, FERRITIN in the last 72 hours. Studies/Results: Dg Chest Port 1 View  Result Date: 12/31/2017 CLINICAL DATA:  Shortness of breath  EXAM: PORTABLE CHEST 1 VIEW COMPARISON:  12/27/2017 FINDINGS: Right pacer remains in place, unchanged. Prior aortic valve repair. Cardiomegaly. Worsening bilateral interstitial and alveolar opacities, likely edema. More confluent airspace opacity at the left base could reflect atelectasis or infiltrate. Small left effusion. IMPRESSION: Worsening interstitial and alveolar opacities compatible with worsening edema. Small left effusion with left base atelectasis or infiltrate. Electronically Signed   By: [email or web address] Armser M.[email or web address] KermLoxahatchWil31mon [place] ATonye Am aspiration risk and recommended esophageal assessment. The patient was recommended to be seated upright at 90 degrees and to remain upright for at least 30 minutes after po intake. She is also to have liquids via straw. 24 hour supervision recommended.   LOS: 8 days   Longino Trefz 01/01/2018,7:12 AM

## 2018-01-01 NOTE — Progress Notes (Signed)
ANTICOAGULATION CONSULT NOTE - Follow Up Consult  Pharmacy Consult for heparin and warfarin Indication: atrial fibrillation  Allergies  Allergen Reactions  . Nubain [Nalbuphine Hcl] Hives    Went into cardiac arrest   . Codeine Hives and Nausea Only    Has tolerated Oxycodone  . Darvocet [Propoxyphene N-Acetaminophen] Nausea Only    Patient Measurements: Height: 5\' 4"  (162.6 cm) Weight: 156 lb 12 oz (71.1 kg) IBW/kg (Calculated) : 54.7 Heparin Dosing Weight: 61.4  Vital Signs: Temp: 98.1 F (36.7 C) (09/13 0635) BP: 124/46 (09/13 1227) Pulse Rate: 72 (09/13 1227)  Labs: Recent Labs    12/30/17 0356 12/30/17 1451 12/31/17 0422 01/01/18 0618  HGB 8.5*  --  9.3*  --   HCT 27.2*  --  29.2*  --   PLT 281  --  321  --   HEPARINUNFRC <0.10* 0.35 0.51 0.41  CREATININE 3.97*  --  3.99* 4.00*    Estimated Creatinine Clearance: 10.3 mL/min (A) (by C-G formula based on SCr of 4 mg/dL (H)).   Medications:  Scheduled:  . amiodarone  100 mg Oral Daily  . feeding supplement  1 Container Oral BID BM  . guaiFENesin  1,200 mg Oral BID  . insulin aspart  0-9 Units Subcutaneous TID WC  . ipratropium-albuterol  3 mL Nebulization BID  . levothyroxine  100 mcg Per Tube QAC breakfast  . mouth rinse  15 mL Mouth Rinse BID  . metoprolol tartrate  25 mg Oral BID  . torsemide  40 mg Oral Daily    Assessment: 84 yo female with pAF (on warfarin PTA) and TAVR (2014) presenting with respiratory distress and CHF exacerbation.  Pharmacy has been consulted to dose heparin bridge to warfarin.   Heparin level therapeutic this morning at 0.41  Hgb 9.3 yesterday. No bleeding noted today.  Will continue with heparin 950 units / hr. Starting warfarin PTA dose 3mg  tonight.  DDI: patient is on amiodarone, will need to monitor INR very closely.   Goal of Therapy:  INR Goal : 2  - 3 Heparin level 0.3-0.7 units/ml Monitor platelets by anticoagulation protocol: Yes   Plan:  Continue  heparin infusion at 950 units/hr Start warfarin 3mg  PO x1 tonight Check anti-Xa level daily while on heparin Check PT / INR daily Continue to monitor H&H and platelets   Thank you for allowing pharmacy to be a part of this patient's care.  Bradley Ferris, PharmD 01/01/2018 2:28 PM PGY-1 Pharmacy Resident Direct Phone: [phone] Please check [email or web address] for unit-specific pharmacist phone numbers

## 2018-01-01 NOTE — Progress Notes (Signed)
PROGRESS NOTE  [name] GDJ:[id number] DOB: [birth year] DOA: 12/24/2017 PCP: Pearson Grippe, MD  Brief Narrative: 84 year old woman complicated PMH including oxygen dependent COPD, systolic CHF, paroxysmal atrial fibrillation, CKD, pulmonary embolism, presented with acute shortness of breath, failed BiPAP after vomiting he was intubated in the emergency department and admitted by critical care.  She was a treated for acute pulmonary edema, acute CHF, COPD and was subsequently extubated, also treated for suspected bilateral pneumonia.  Renal function continued to worsen and at this point dialysis may be needed in the future.   Subjective -Does report worsening cough today.  Assessment/Plan  Acute on chronic systolic/diastolic CHF -Patient with known EF 25 to 30% on echo July 2019, she presents with respiratory failure, requiring intubation in the setting of pneumonia and volume overload. -Diuresis remains a challenge in the setting of advanced kidney disease, currently on torsemide 40 mg oral daily, with improved volume status, getting remained stable at 4, followed by renal . -Again emphasized importance of chest PT, incentive spirometry and flutter valve .  Acute on chronic hypoxic respiratory failure,  -Appears to be multifactorial, in the setting of baseline COPD, CHF, with multifocal pneumonia on admission required intubation in ED, she is currently back on 3 L nasal cannula . -Current pneumonia, has been seen by SLP, she did not want any mechanical studies for now, she is on pured diet with thin liquid . -Treated with total of 7 days of cefepime  -Improved with diuresis, and using incentive spirometry and flutter valve .  AKI on CKD stage IV thought to be hemodynamically mediated in the setting of CHF and poor renal perfusion.  Nonoliguric. --Is been followed by renal, diuresis per them,   Multifocal pneumonia -required  intubation on admission, today she finished 7 days of  cefepime  Demand ischemia, no further ischemic work-up per cardiology.  DM type 2 --Stable.  Continue sliding scale insulin.  Severe aortic stenosis status post TAVR 2014 --Felt to be working properly per cardiology  PAF on warfarin, status post dual-chamber pacemaker --On hold, continue with warfarin  Hypothyroidism. --Levothyroxine was adjusted to 100 mcg since admission  Dementia  Aortic atherosclerosis   Worsening renal function.  Continue management per nephrology and cardiology.    Palliative medicine consultation pending.   DVT prophylaxis: heparin infusion Code Status: partial Family Communication: None at bedside Disposition Plan: will need SNF   Huey Bienenstock, MD  [facility] Direct contact: [phone] --Via amion app OR  --[email or web address]; password [email or web address]  7PM-7AM contact night coverage as above 01/01/2018, 1:50 PM  LOS: 8 days   Consultants:  Cardiology  PCCM  Nephrology   Procedures:  ETT  Antimicrobials:  Cefepime     Objective: Vitals:  Vitals:   01/01/18 0905 01/01/18 1227  BP:  (!) 124/46  Pulse:  72  Resp:  18  Temp:    SpO2: 98% 100%    Exam:  Awake Alert, Oriented X 3, No new F.N deficits, frail, chronically ill-appearing, normal affect, appears to be more comfortable today Symmetrical Chest wall movement, improved air entry at the bases, no further crackles, no wheezing  RRR,No Gallops,Rubs or new Murmurs, No Parasternal Heave +ve B.Sounds, Abd Soft, No tenderness, No rebound - guarding or rigidity. No Cyanosis, Clubbing or edema, No new Rash or bruise      I have personally reviewed the following:   Data: . Blood sugars stable. . BUN 58, creatinine 4.12.  Potassium within normal limits.  Scheduled Meds: . amiodarone  100 mg Oral Daily  . feeding supplement  1 Container Oral BID BM  . guaiFENesin  1,200 mg Oral BID  . insulin aspart  0-9 Units Subcutaneous TID WC  . ipratropium-albuterol  3 mL  Nebulization BID  . levothyroxine  100 mcg Per Tube QAC breakfast  . mouth rinse  15 mL Mouth Rinse BID  . metoprolol tartrate  25 mg Oral BID  . torsemide  40 mg Oral Daily   Continuous Infusions: . sodium chloride    . heparin 950 Units/hr (01/01/18 1112)    Active Problems:   Acute on chronic combined systolic and diastolic CHF (congestive heart failure) (HCC)   Hypothyroidism   CKD (chronic kidney disease) stage 4, GFR 15-29 ml/min (HCC)   Acute on chronic respiratory failure with hypoxia (HCC)   Acute renal failure with acute tubular necrosis superimposed on stage 4 chronic kidney disease (HCC)   Lobar pneumonia (HCC)   Demand ischemia (HCC)   Acute respiratory failure with hypoxia and hypercapnia (HCC)   Goals of care, counseling/discussion   Palliative care by specialist   Advanced care planning/counseling discussion   LOS: 8 days

## 2018-01-01 NOTE — Progress Notes (Signed)
Daily Progress Note   Patient Name: [name]       Date: 01/01/2018 DOB: 19-Oct-1932  Age: 84 y.o. MRN#: [id number] Attending Physician: Starleen Arms, MD Primary Care Physician: Pearson Grippe, MD Admit Date: 12/24/2017  Reason for Consultation/Follow-up: Establishing goals of care  Subjective: Patient is sitting up in bed watching tv. Does complain of shortness of breath and worsening cough. Denies pain or discomfort. She is A&O x3. Some coughing observed with clear sputum production.   Patient and I had a discussion regarding her goals of care. Son, Onalee Hua also involved in discussion via telephone at patient's request. My colleague, Lenord Carbo., NP has been involved in her care over the past few days as well. Patient and son both verbalized a noticeable decline in patient's health over the past few months. Son verbalized that he knew her hospitalization and illness had worsen and was not the best this admission. Patient also verbalized that she is "getting tired and is weaker than she has ever been." She complains of the struggles to catch her breath and perform minimum activity without having extreme fatigue as well. Patient stated she was aware that her respiratory status and heart condition was worsening and that she knows this would make it hard and unsafe to tolerate dialysis. She reports her and her son were considering initiating dialysis due to her renal failure. However, patient now has decided she will not proceed with dialysis as she is wants to live what time she has left in decent quality. Onalee Hua verbalized his understanding of her wishes and to agrees. He reports he also feels that her risk of aspiration is working against her and causing worsening of respiratory status in conjunction  with her COPD and CHF. He was worried that her normal medical team was not coming in to see her, however, support was given and I assured him all team members are aware of all of her medical history and are making the best medical decisions for her. Son was appreciative in knowing the team was able to view all medical records that were necessary in her care.   Given patient's and son's stated goal was to not proceed with dialysis, no interest in aggressive measures we discussed hospice services. Patient verbalized her goal was to return to [facility] were she has  resided over the past 3 years until she passed away. We briefly discussed outpatient hospice services. Both patient and her son verbalized understanding and wishes to have services at discharge. Also given patient's goal for comfort care I discussed her current Partial Code status. Patient originally felt that she would want to have a timed trial of CPR and maybe life support. We discussed in details what a code situation could possibly look like in her current condition including co-morbidities (multi-organ failure). Patient and son both verbalized their understanding and patient verbalized that given her goal is to be comfortable, not suffer, and to have hospice services she better understood and would not want to have any heroic measures such as CPR, defibrillation, or intubation. She has requested to be a DNR. Son, Onalee Hua verbalized that he was also in agreement with this decision and stated it was traumatic to see his mother extubated earlier this admission. Advised her code status would be changed at their request and nursing staff would come in to place a purple DNR bracelet on patient. She verbalized understanding.   Length of Stay: 8  Current Medications: Scheduled Meds:  . amiodarone  100 mg Oral Daily  . feeding supplement  1 Container Oral BID BM  . guaiFENesin  1,200 mg Oral BID  . insulin aspart  0-9 Units Subcutaneous TID WC  .  ipratropium-albuterol  3 mL Nebulization BID  . levothyroxine  100 mcg Per Tube QAC breakfast  . mouth rinse  15 mL Mouth Rinse BID  . metoprolol tartrate  25 mg Oral BID  . potassium chloride  40 mEq Oral Once  . torsemide  40 mg Oral Daily    Continuous Infusions: . sodium chloride    . heparin 950 Units/hr (12/31/17 1221)    PRN Meds: sodium chloride, acetaminophen, albuterol, hydrALAZINE, HYDROcodone-acetaminophen, metoprolol tartrate, ondansetron (ZOFRAN) IV, senna  Physical Exam  Constitutional: She is oriented to person, place, and time. She has a sickly appearance.  Frail and chronically ill appearing   Cardiovascular: Normal heart sounds and normal pulses. Tachycardia present.  Pulmonary/Chest: Accessory muscle usage present. She is in respiratory distress. She has decreased breath sounds.  Shortness of breath, 3L/[place]   Neurological: She is alert and oriented to person, place, and time.  Skin: Skin is warm and dry. Bruising noted.  Psychiatric: She has a normal mood and affect. Her speech is normal and behavior is normal. Judgment and thought content normal. Cognition and memory are normal.  Nursing note and vitals reviewed.           Vital Signs: BP (!) 150/81 (BP Location: Left Leg)   Pulse (!) 105   Temp 98.1 F (36.7 C)   Resp 18   Ht 5\' 4"  (1.626 m)   Wt 71.1 kg   SpO2 98%   BMI 26.91 kg/m  SpO2: SpO2: 98 % O2 Device: O2 Device: Nasal Cannula O2 Flow Rate: O2 Flow Rate (L/min): 3 L/min  Intake/output summary:   Intake/Output Summary (Last 24 hours) at 01/01/2018 1105 Last data filed at 01/01/2018 0900 Gross per 24 hour  Intake 840 ml  Output 350 ml  Net 490 ml   LBM: Last BM Date: 12/29/17 Baseline Weight: Weight: 72 kg Most recent weight: Weight: 71.1 kg       Palliative Assessment/Data: PPS 20%   Patient Active Problem List   Diagnosis Date Noted  . Acute respiratory failure with hypoxia and hypercapnia (HCC)   . Goals of care,  counseling/discussion   .  Palliative care by specialist   . Advanced care planning/counseling discussion   . Lobar pneumonia (HCC) 12/28/2017  . Demand ischemia (HCC) 12/28/2017  . Acute renal failure with acute tubular necrosis superimposed on stage 4 chronic kidney disease (HCC)   . Pneumonia due to infectious organism 12/18/2017  . Elevated brain natriuretic peptide (BNP) level   . SOB (shortness of breath)   . Elevated troponin 11/07/2017  . Anticoagulation management encounter 09/29/2016  . Acute on chronic respiratory failure with hypoxia (HCC) 07/21/2016  . Head contusion 07/15/2016  . Fall 07/14/2016  . Unstable angina (HCC)   . Elevated blood sugar 06/13/2016  . Dementia without behavioral disturbance 01/09/2016  . Chronic constipation 01/09/2016  . Neuropathic pain 01/09/2016  . Esophageal reflux 01/09/2016  . Hypothyroidism 01/09/2016  . CKD (chronic kidney disease) stage 4, GFR 15-29 ml/min (HCC) 01/09/2016  . Right patellar tendon rupture 06/25/2015  . Avulsion of right patellar tendon 03/08/2015  . Anemia of chronic disease 12/14/2014  . Swallowing dysfunction 12/14/2014  . S/P right TKA 12/04/2014  . S/P knee replacement 12/04/2014  . Thrombocytopenia (HCC) 12/28/2012  . Toe fracture 12/28/2012  . Low back pain 12/28/2012  . Wound dehiscence, surgical 12/28/2012  . S/P TAVR (transcatheter aortic valve replacement) 12/21/2012  . Severe aortic stenosis 12/06/2012  . Aortic stenosis 10/13/2012  . Acute on chronic combined systolic and diastolic CHF (congestive heart failure) (HCC) 10/13/2012  . Pacemaker 10/15/2010  . Paroxysmal atrial fibrillation (HCC) 10/15/2010  . Dyslipidemia 10/15/2010  . Hypertension 10/15/2010    Palliative Care Assessment & Plan   Patient Profile: 84 y.o.femalewith past medical history of CHF, COPD on chronic O2 with multiple recent hospitalizations for exacerbations and pneumonia, CKD, severe aortic stenosis s/p TAVR, hx CVA,  dementiaadmitted on 9/5/2019with acute respiratory failure requiring intubation-extubated 9/7 - workup reveals pneumonia, CHF exacerbation, AKI on CKD-IV, anemia of CKD. Palliative medicine consulted for GOC and discussion re: possible dialysis decision.  Assessment:  Continue current care with plan of discharging back to [facility] with outpatient hospice care.   Patient and son has agreed to DNR/DNI  Recommendations/Plan:  DNR/DNI-as requested by patient/son  Continue to treat the treatable while hospitalized without escalation of care.   CSW referral for outpatient hospice and discharge back to [facility] as requested by patient/son.   Palliative Medicine team will continue to support patient, family, and medical team during hospitalization as needed. Please call.   Goals of Care and Additional Recommendations:  Limitations on Scope of Treatment: Full Scope Treatment, No Artificial Feeding and No Hemodialysis  Code Status:    Code Status Orders  (From admission, onward)         Start     Ordered   12/25/17 1155  Limited resuscitation (code)  Continuous    Question Answer Comment  In the event of cardiac or respiratory ARREST: Initiate Code Blue, Call Rapid Response Yes   In the event of cardiac or respiratory ARREST: Perform CPR No   In the event of cardiac or respiratory ARREST: Perform Intubation/Mechanical Ventilation Yes   In the event of cardiac or respiratory ARREST: Use NIPPV/BiPAp only if indicated Yes   In the event of cardiac or respiratory ARREST: Administer ACLS medications if indicated Yes   In the event of cardiac or respiratory ARREST: Perform Defibrillation or Cardioversion if indicated No      12/25/17 1154        Code Status History    Date Active Date Inactive Code  Status Order ID Comments User Context   12/24/2017 0803 12/25/2017 1154 Full Code [id number]  Minor, Vilinda Blanks, NP ED   12/04/2017 1602 12/08/2017 1853 Full Code [id number]  Delaine Lame, MD  Inpatient   11/07/2017 0433 11/12/2017 2203 Full Code [id number]  Clydie Braun, MD ED   07/21/2016 1058 07/30/2016 1921 Partial Code [id number]  Ozella Rocks, MD ED   07/14/2016 2204 07/18/2016 0011 Full Code [id number]  Clydie Braun, MD ED   06/13/2016 1017 06/27/2016 1811 Full Code [id number]  Levie Heritage, DO ED   03/08/2015 2025 03/12/2015 1709 Full Code [id number]  Shelly Coss Inpatient   12/15/2014 0039 12/15/2014 1836 Full Code [id number]  Yevonne Pax, MD Inpatient   12/04/2014 1600 12/07/2014 1713 Full Code [id number]  Shelly Coss Inpatient   12/21/2012 1556 12/28/2012 1934 DNR [id number]  Tonny Bollman, MD Inpatient       Prognosis:   Unable to determine-guarded to poor in the setting of renal failure stage 4, poor candidate for HD, CHF, EF 25-30%, COPD end-stage, pneumonia, high risk aspiration, decreased mobility, poor po intake, acute on chronic respiratory failure with hypoxia,   Discharge Planning:  Skilled Nursing Facility with Hospice  Care plan was discussed with patient, patient's son, Onalee Hua (POA), bedside RN, and Dr. Randol Kern.   Thank you for allowing the Palliative Medicine Team to assist in the care of this patient.   Time In: 1000 Time Out: 1115 Total Time 75 min.  Prolonged Time Billed  YES        Greater than 50%  of this time was spent counseling and coordinating care related to the above assessment and plan.  Willette Alma, AGPCNP-BC Palliative Medicine Team  Phone: [phone] Fax: [phone] Pager: [phone] Amion: Thea Alken   Please contact Palliative Medicine Team phone at [phone] for questions and concerns.

## 2018-01-01 NOTE — Progress Notes (Addendum)
  Speech Language Pathology Treatment: Dysphagia  Patient Details Name: [name] MRN: [id number] DOB: [birth year] Today's Date: 01/01/2018 Time: 1110-1130 SLP Time Calculation (min) (ACUTE ONLY): 20 min  Assessment / Plan / Recommendation Clinical Impression  Pt seen at bedside for trial of advanced consistencies. Pt continues to demonstrate cough/gag in the absence of po presentations. Pt was given thin liquid, puree, and soft solids, and appeared to tolerate each without oral difficulty or overt s/s aspiration. Pt has upper dentures at home, and is used to eating with them in place. She continues to be quite short of breath, so for energy conservation, will advance diet to puree and thin liquids. Pt is at high aspiration risk given dementia, COPD, and GERD. Objective study would rule out silent aspiration, however pt stated "I don't seen the point since I'm going home with hospice". Safe swallow precautions posted at [facility]. ST will follow for diet tolerance assessment and education. RN and MD informed of results and recommendations.    HPI HPI: 84 year old female admitted 12/24/17 with SOB. Intubated in ED, extubated 12/26/17. Multiple recent hospitalizations and PNA. PMH: renal failure, CKD, CHF, COPD, dementia, GERD, HLD, HTN, CVA. CXR = stable small left pleural effusion and LLL atelectasis, pacemaker in place.      SLP Plan  Continue with current plan of care       Recommendations  Diet recommendations: Dysphagia 1 (puree);Thin liquid Liquids provided via: Cup;Straw Medication Administration: Crushed with puree Supervision: Patient able to self feed;Intermittent supervision to cue for compensatory strategies Compensations: Minimize environmental distractions;Small sips/bites;Slow rate Postural Changes and/or Swallow Maneuvers: Seated upright 90 degrees;Upright 30-60 min after meal                Oral Care Recommendations: Oral care BID Follow up Recommendations: 24 hour  supervision/assistance SLP Visit Diagnosis: Dysphagia, unspecified (R13.10) Plan: Continue with current plan of care       GO              Valari Taylor B. Murvin [facility], CCC-SLP Speech Language Pathologist [phone]  [name] 01/01/2018, 11:31 AM

## 2018-01-02 DIAGNOSIS — J9620 Acute and chronic respiratory failure, unspecified whether with hypoxia or hypercapnia: Secondary | ICD-10-CM

## 2018-01-02 LAB — BASIC METABOLIC PANEL
ANION GAP: 14 (ref 5–15)
BUN: 48 mg/dL — ABNORMAL HIGH (ref 8–23)
CO2: 24 mmol/L (ref 22–32)
Calcium: 9.6 mg/dL (ref 8.9–10.3)
Chloride: 101 mmol/L (ref 98–111)
Creatinine, Ser: 3.98 mg/dL — ABNORMAL HIGH (ref 0.44–1.00)
GFR calc non Af Amer: 10 mL/min — ABNORMAL LOW (ref >60)
GFR, EST AFRICAN AMERICAN: 11 mL/min — AB (ref >60)
Glucose, Bld: 94 mg/dL (ref 70–99)
POTASSIUM: 3.2 mmol/L — AB (ref 3.5–5.1)
Sodium: 139 mmol/L (ref 135–145)

## 2018-01-02 LAB — GLUCOSE, CAPILLARY
GLUCOSE-CAPILLARY: 91 mg/dL (ref 70–99)
GLUCOSE-CAPILLARY: 157 mg/dL — AB (ref 70–99)
GLUCOSE-CAPILLARY: 94 mg/dL (ref 70–99)

## 2018-01-02 LAB — CBC
HEMATOCRIT: 30.2 % — AB (ref 36.0–46.0)
Hemoglobin: 9.6 g/dL — ABNORMAL LOW (ref 12.0–15.0)
MCH: 31.8 pg (ref 26.0–34.0)
MCHC: 31.8 g/dL (ref 30.0–36.0)
MCV: 100 fL (ref 78.0–100.0)
PLATELETS: 291 10*3/uL (ref 150–400)
RBC: 3.02 MIL/uL — AB (ref 3.87–5.11)
RDW: 16.8 % — ABNORMAL HIGH (ref 11.5–15.5)
WBC: 10.1 10*3/uL (ref 4.0–10.5)

## 2018-01-02 LAB — HEPARIN LEVEL (UNFRACTIONATED): Heparin Unfractionated: 0.47 IU/mL (ref 0.30–0.70)

## 2018-01-02 LAB — PROTIME-INR
INR: 1.18
Prothrombin Time: 14.9 seconds (ref 11.4–15.2)

## 2018-01-02 MED ORDER — MORPHINE SULFATE 20 MG/5ML PO SOLN: 5.0000 mg | ORAL | 0 refills | Status: DC | PRN

## 2018-01-02 MED ORDER — POTASSIUM CHLORIDE CRYS ER 20 MEQ PO TBCR
40.0000 meq | EXTENDED_RELEASE_TABLET | Freq: Once | ORAL | Status: AC
  Administered 2018-01-02: 40 meq via ORAL
  Filled 2018-01-02: qty 2

## 2018-01-02 MED ORDER — WARFARIN SODIUM 3 MG PO TABS
3.0000 mg | ORAL_TABLET | Freq: Once | ORAL | Status: AC
  Administered 2018-01-02: 3 mg via ORAL
  Filled 2018-01-02: qty 1

## 2018-01-02 MED ORDER — METOPROLOL TARTRATE 25 MG PO TABS: 25.0000 mg | ORAL_TABLET | Freq: Two times a day (BID) | ORAL | Status: AC

## 2018-01-02 MED ORDER — GUAIFENESIN ER 600 MG PO TB12: 1200.0000 mg | ORAL_TABLET | Freq: Two times a day (BID) | ORAL | Status: AC

## 2018-01-02 NOTE — Progress Notes (Signed)
[name] will be discharged to [facility] via [facility] AVS went over with and given to  [facility] Staff.  Patient given flutter valve and Incentive spirometer, education provided. Patient demonstrated satisfactory use of both devices.  Report called to nurse at [facility], awaiting [facility] transport.   Vitals:   01/02/18 0449 01/02/18 0754  BP: (!) 144/65   Pulse: 82   Resp: 20   Temp: 98.3 F (36.8 C)   SpO2: 96% 96%     Elita Quick, RN

## 2018-01-02 NOTE — Progress Notes (Signed)
Patient will Discharge GG:[id number] Place Anticipated DC Date:01/02/18 Family Notified:yes Prescott Nares [phone] Transport By:EMS   Per MD patient ready for DC to [facility] . RN, patient, patient's family, and facility notified of DC. Assessment, Fl2/Pasrr, and Discharge Summary sent to facility. RN given number for report [phone], Room # [address]). DC packet on chart. Ambulance transport requested for patient.   CSW signing off.  Budd Palmer LCSWA [phone]

## 2018-01-02 NOTE — Progress Notes (Signed)
ANTICOAGULATION CONSULT NOTE - Follow Up Consult  Pharmacy Consult for heparin and warfarin Indication: atrial fibrillation  Allergies  Allergen Reactions  . Nubain [Nalbuphine Hcl] Hives    Went into cardiac arrest   . Codeine Hives and Nausea Only    Has tolerated Oxycodone  . Darvocet [Propoxyphene N-Acetaminophen] Nausea Only    Patient Measurements: Height: 5\' 4"  (162.6 cm) Weight: 157 lb 13.6 oz (71.6 kg) IBW/kg (Calculated) : 54.7 Heparin Dosing Weight: 61.4  Vital Signs: Temp: 98.3 F (36.8 C) (09/14 0449) Temp Source: Oral (09/14 0449) BP: 144/65 (09/14 0449) Pulse Rate: 82 (09/14 0449)  Labs: Recent Labs    12/31/17 0422 01/01/18 0618 01/02/18 0754  HGB 9.3*  --  9.6*  HCT 29.2*  --  30.2*  PLT 321  --  291  LABPROT  --   --  14.9  INR  --   --  1.18  HEPARINUNFRC 0.51 0.41 0.47  CREATININE 3.99* 4.00* 3.98*    Estimated Creatinine Clearance: 10.4 mL/min (A) (by C-G formula based on SCr of 3.98 mg/dL (H)).   Medications:  Scheduled:  . amiodarone  100 mg Oral Daily  . feeding supplement  1 Container Oral BID BM  . guaiFENesin  1,200 mg Oral BID  . insulin aspart  0-9 Units Subcutaneous TID WC  . ipratropium-albuterol  3 mL Nebulization BID  . levothyroxine  100 mcg Per Tube QAC breakfast  . mouth rinse  15 mL Mouth Rinse BID  . metoprolol tartrate  25 mg Oral BID  . torsemide  40 mg Oral Daily  . warfarin  3 mg Oral ONCE-1800  . Warfarin - Pharmacist Dosing Inpatient   Does not apply q1800    Assessment: 84 yo female with pAF (on warfarin 3mg  daily PTA) and TAVR (2014) presenting with respiratory distress and CHF exacerbation. Pharmacy has been consulted to dose heparin bridge to warfarin.   Heparin level therapeutic today at 0.47 on 950 units/hr.  Hgb low stable; platelets wnl. No bleeding noted today.  INR subtherapeutic at 1.18 after one dose of warfarin 3 mg.   DDI: patient is on amiodarone, will need to monitor INR very closely.    Goal of Therapy:  INR Goal : 2  - 3 Heparin level 0.3-0.7 units/ml Monitor platelets by anticoagulation protocol: Yes   Plan:  Continue heparin infusion at 950 units/hr Start warfarin 3mg  PO x1 tonight Check anti-Xa level daily while on heparin Check PT / INR daily Continue to monitor H&H and platelets  Ladell Pier, PharmD Pharmacy Resident Clinical Phone for 01/02/2018 until 3:30pm: [phone] If after 3:30pm, please call main pharmacy at [phone] 01/02/2018 11:39 AM

## 2018-01-02 NOTE — Discharge Summary (Signed)
[name], is a 84 y.o. female  DOB [birth year]  MRN [id number].  Admission date:  12/24/2017  Admitting Physician  Derwood Kaplan, MD  Discharge Date:  01/02/2018   Primary MD  Pearson Grippe, MD  Recommendations for primary care physician for things to follow:  -please consult hospice service when patient presents your facility -Continue to monitor and dose warfarin -He is encouraged to continue with use of incentive spirometry, flutter valve, patient to be fed setting up all the time, with full aspiration precaution. -Continue with 3 L nasal cannula  CODE STATUS DNR/DNI   Admission Diagnosis  Lactic acidosis [E87.2] Acute respiratory failure with hypoxia and hypercapnia (HCC) [J96.01, J96.02]   Discharge Diagnosis  Lactic acidosis [E87.2] Acute respiratory failure with hypoxia and hypercapnia (HCC) [J96.01, J96.02]    Active Problems:   Acute on chronic combined systolic and diastolic CHF (congestive heart failure) (HCC)   Hypothyroidism   CKD (chronic kidney disease) stage 4, GFR 15-29 ml/min (HCC)   Acute on chronic respiratory failure with hypoxia (HCC)   Acute renal failure with acute tubular necrosis superimposed on stage 4 chronic kidney disease (HCC)   Lobar pneumonia (HCC)   Demand ischemia (HCC)   Acute respiratory failure with hypoxia and hypercapnia (HCC)   Goals of care, counseling/discussion   Palliative care by specialist   Advanced care planning/counseling discussion      Past Medical History:  Diagnosis Date  . Acute on chronic renal failure [facility])    sees Dr Allena Katz   . Anemia    Acute blood loss  . Aortic regurgitation   . Aortic stenosis 10/13/2012   Low EF, low gradient with severe aortic stenosis confirmed by dobutamine stress echocardiogram s/p TAVR 12/2012  . Asthma   . Atrial fibrillation (HCC)    tachy-brady syndrome with <1% recurrent PAF since pacemaker  placement  . Cataracts, bilateral   . Chronic combined systolic and diastolic CHF (congestive heart failure) (HCC)   . Chronic lower back pain   . CKD (chronic kidney disease)   . Coronary artery disease involving native coronary artery of native heart   . Dementia    Without behavioral disturbance  . Dysrhythmia   . Fibromyalgia   . Gastroesophageal reflux disease   . H/O dizziness   . H/O urinary frequency   . H/O: stroke   . Hard of hearing   . Headache    hx of migraines   . Heart murmur   . History of blood transfusion   . History of bronchitis   . History of kidney stones   . History of urinary tract infection   . HLD (hyperlipidemia)   . HTN (hypertension)   . Hypothyroidism   . Neuropathy   . Nonischemic cardiomyopathy (HCC)   . On home oxygen therapy    patient uses at nite- 2L- has not used in > 6 months per patient  . Osteoarthritis   . Osteoporosis   . Pneumonia    hx of x 3   .  PONV (postoperative nausea and vomiting)   . Presence of permanent cardiac pacemaker   . Pulmonary embolism (HCC)    HISTORY OF, the pt. had a recurrent bilateral pulmonary emboli in 2005, on warfarin therapy and at which time she under went implantation of IVC filter  . Repeated falls   . Rupture of right patellar tendon   . Sciatica   . Shortness of breath dyspnea    with exertion   . Sleep apnea    uses oxygen at night and PRN- not used since > 6 months / DOES NOT USE  C-PAP  . Spinal stenosis   . Stroke (HCC)   . Symptomatic bradycardia   . Symptomatic bradycardia 2012   s/p Med[employer] PPM  . Syncope   . Urinary incontinence   . Urinary urgency     Past Surgical History:  Procedure Laterality Date  . ABDOMINAL HYSTERECTOMY    . APPENDECTOMY    . BACK SURGERY    . BUNIONECTOMY    . CARDIAC CATHETERIZATION    . CATARACT EXTRACTION    . CENTRAL VENOUS CATHETER INSERTION Left 12/21/2012   Procedure: INSERTION CENTRAL LINE ADULT;  Surgeon: Michael Cooper, MD;  Location:  [facility] OR;  Service: Open Heart Surgery;  Laterality: Left;  . ELBOW SURGERY     bilat   . INTRAOPERATIVE TRANSESOPHAGEAL ECHOCARDIOKoreaGRMount Desert Island HosKentuckypita6[place] Aste879Ginger CarneadeProcedure: INTRAOK[place]PELawn[place] [facility] TEE: well-seated bioprosthetic aortic valve with mean gradient 2 mmHg, trivial AI, mild MR, EF 30-35%  . TRANSCATHETER AORTIC VALVE REPLACEMENT, TRANSFEMORAL N/A 12/21/2012   Procedure: TRANSCATHETER AORTIC VALVE REPLACEMENT, TRANSFEMORAL;  Surgeon: Michael Cooper, MD;  Location: [facility] OR;  Service: Open Heart Surgery;  Laterality: N/A;       History of present illness and  Hospital Course:     Kindly see H&P for history of present illness  and admission details, please review complete Labs, Consult reports and Test reports for all details in brief  HPI  from the history and physical done on the day of admission 12/24/2017  85 year old female with extensive past medical history that is includes but not limited to O2 dependent COPD, morbid obesity, congestive  heart failure with a EF of 20%, aortic stenosis severe, paroxysmal atrial fibrillation on Coumadin, dementia, chronic kidney disease with a base creatinine of 2.2, history of pulmonary embolism, who was recently discharged from [facility] on 12/07/2017 for similar episode of acute shortness of breath responding to Lasix. She returns to [facility] emergency department on 12/24/2017 again with acute shortness of breath along with thick sputum she was placed on BiPAP did not tolerate it developed frothy blood-tinged sputum and when asked if she wanted to be intubated was intubated by the emergency department physician. Pulmonary critical care was called to admit.   Hospital Course   84 year old woman complicated PMH including oxygen dependent COPD, systolic CHF, paroxysmal atrial fibrillation, CKD, pulmonary embolism, presented with acute shortness of breath, failed BiPAP after vomiting he was intubated in the emergency department and admitted by critical care.  She was a treated for acute pulmonary edema, acute CHF, COPD and was subsequently extubated, also treated for suspected bilateral pneumonia.  Renal function continues to worsening, but she was volume overloaded, so diuresis was continued, new baseline creatinine around 4.   Acute on chronic systolic/diastolic CHF -Patient with known EF 25 to 30% on echo July 2019, she presents with respiratory failure, requiring intubation in the setting of pneumonia and volume overload. -Diuresis remains a challenge in the setting of advanced kidney disease, currently on torsemide 40 mg oral daily, with improved volume status, improvement  of her respiratory status, creatinine remained stable around 4, commendation is to discharge on current dose . -Patient was not felt to be a dialysis candidate by renal given her poor functional status, poor cardiac function, age, calluses may not improve patient's quality of life. -Again emphasized importance of chest PT, incentive spirometry and flutter valve .  Acute on chronic hypoxic respiratory failure,  -Appears to be multifactorial, in the setting of baseline COPD, CHF, with multifocal pneumonia on admission required intubation in ED, she is currently back on 3 L nasal cannula . -Current pneumonia, has been seen by SLP, she did not want any mechanical studies for now, she is on pured diet with thin liquid . -Treated with total of 7 days of cefepime  -I have encouraged her to continue using flutter valve, incentive spirometry .  I have instructed staff to send flutter valve and incentive spirometry with her to facility.  AKI on CKD stage IV thought to be hemodynamically mediated in the setting of CHF and poor renal perfusion.  Nonoliguric. --Is been followed by renal, diuresis per them,   Multifocal pneumonia -required  intubation on admission, finished 7 days of cefepime treatment during hospital stay  Demand ischemia, no further ischemic work-up per cardiology.  DM type 2 --Stable.    Was on insulin sliding scale during hospital stay, did not require insulin.  Severe aortic stenosis status post TAVR 2014 --Felt to be working properly per cardiology  PAF on warfarin, status post dual-chamber pacemaker --Continue with warfarin  Hypothyroidism. --Levothyroxine was adjusted to 100 mcg since admission  Dementia  Aortic atherosclerosis   Goals of care: -palliative  medicine has been consulted, patient is currently DNR/DNI, patient will be discharged back to [facility], and to be followed by outpatient hospice. -I have discussed with her, will order some sublingual  liquid PR and morphine for dyspnea and pain.  Discharge Condition:  - she is being discharged with hospice care      Discharge Instructions  and  Discharge Medications   Discharge Instructions  Discharge instructions   Complete by:  As directed    Follow with Primary MD Pearson Grippe, MD  Or SNF physician -Please consult hospice when patient presents to SNF  Get CBC, CMP, 2 view Chest X ray checked  by Primary MD next visit.    Activity: As tolerated with Full fall precautions use walker/cane & assistance as needed   Disposition SNF   Diet: Heart Healthy with low-salt, dysphagia 1 with thin liquid, with feeding assistance and aspiration precautions.  For Heart failure patients - Check your Weight same time everyday, if you gain over 2 pounds, or you develop in leg swelling, experience more shortness of breath or chest pain, call your Primary MD immediately. Follow Cardiac Low Salt Diet and 1.5 lit/day fluid restriction.   On your next visit with your primary care physician please Get Medicines reviewed and adjusted.   Please request your Prim.MD to go over all Hospital Tests and Procedure/Radiological results at the follow up, please get all Hospital records sent to your Prim MD by signing hospital release before you go home.   If you experience worsening of your admission symptoms, develop shortness of breath, life threatening emergency, suicidal or homicidal thoughts you must seek medical attention immediately by calling 911 or calling your MD immediately  if symptoms less severe.  You Must read complete instructions/literature along with all the possible adverse reactions/side effects for all the Medicines you take and that have been prescribed to you. Take any new Medicines after you have completely understood and accpet all the possible adverse reactions/side effects.   Do not drive, operating heavy machinery, perform activities at heights, swimming or participation in  water activities or provide baby sitting services if your were admitted for syncope or siezures until you have seen by Primary MD or a Neurologist and advised to do so again.  Do not drive when taking Pain medications.    Do not take more than prescribed Pain, Sleep and Anxiety Medications  Special Instructions: If you have smoked or chewed Tobacco  in the last 2 yrs please stop smoking, stop any regular Alcohol  and or any Recreational drug use.  Wear Seat belts while driving.   Please note  You were cared for by a hospitalist during your hospital stay. If you have any questions about your discharge medications or the care you received while you were in the hospital after you are discharged, you can call the unit and asked to speak with the hospitalist on call if the hospitalist that took care of you is not available. Once you are discharged, your primary care physician will handle any further medical issues. Please note that NO REFILLS for any discharge medications will be authorized once you are discharged, as it is imperative that you return to your primary care physician (or establish a relationship with a primary care physician if you do not have one) for your aftercare needs so that they can reassess your need for medications and monitor your lab values.   Increase activity slowly   Complete by:  As directed      Allergies as of 01/02/2018      Reactions   Nubain [nalbuphine Hcl] Hives   Went into cardiac arrest    Codeine Hives, Nausea Only   Has tolerated Oxycodone   Darvocet [propoxyphene N-acetaminophen] Nausea Only      Medication List    STOP taking these medications   atorvastatin 10 MG tablet Commonly known as:  LIPITOR  bisacodyl 10 MG suppository Commonly known as:  DULCOLAX   HYDROcodone-acetaminophen 5-325 MG tablet Commonly known as:  NORCO/VICODIN   isosorbide mononitrate 30 MG 24 hr tablet Commonly known as:  IMDUR   metolazone 2.5 MG tablet Commonly  known as:  ZAROXOLYN     TAKE these medications   allopurinol 100 MG tablet Commonly known as:  ZYLOPRIM Take 100 mg by mouth daily.   aluminum-magnesium hydroxide-simethicone 200-200-20 MG/5ML Susp Commonly known as:  MAALOX Take 30 mLs by mouth 3 (three) times daily as needed (for indegestion).   amiodarone 100 MG tablet Commonly known as:  PACERONE Take 100 mg by mouth daily.   BIOFREEZE 4 % Gel Generic drug:  Menthol (Topical Analgesic) Apply 1 application topically 2 (two) times daily.   calcitRIOL 0.25 MCG capsule Commonly known as:  ROCALTROL Take 0.25 mcg by mouth daily.   cholecalciferol 1000 units tablet Commonly known as:  VITAMIN D Take 2,000 Units by mouth at bedtime.   cycloSPORINE 0.05 % ophthalmic emulsion Commonly known as:  RESTASIS Place 1 drop into both eyes 2 (two) times daily.   docusate sodium 100 MG capsule Commonly known as:  COLACE Take 1 capsule (100 mg total) by mouth 2 (two) times daily.   donepezil 10 MG tablet Commonly known as:  ARICEPT Take 10 mg by mouth at bedtime.   DULoxetine 60 MG capsule Commonly known as:  CYMBALTA Take 60 mg by mouth daily at 6 PM.   ferrous sulfate 325 (65 FE) MG tablet Take 325 mg by mouth daily.   gabapentin 100 MG capsule Commonly known as:  NEURONTIN Take 200 mg by mouth 2 (two) times daily.   guaiFENesin 600 MG 12 hr tablet Commonly known as:  MUCINEX Take 2 tablets (1,200 mg total) by mouth 2 (two) times daily.   ipratropium-albuterol 0.5-2.5 (3) MG/3ML Soln Commonly known as:  DUONEB Take 3 mLs by nebulization every 6 (six) hours as needed (wheezing/shortness of breath).   ipratropium-albuterol 0.5-2.5 (3) MG/3ML Soln Commonly known as:  DUONEB Take 3 mLs by nebulization 2 (two) times daily.   levothyroxine 100 MCG tablet Commonly known as:  SYNTHROID, LEVOTHROID Take 1 tablet (100 mcg total) by mouth daily before breakfast.   lidocaine 5 % Commonly known as:  LIDODERM Place 1 patch  onto the skin daily. Apply to lower back as needed for pain.   LINZESS 290 MCG Caps capsule Generic drug:  linaclotide Take 290 mcg by mouth daily.   metoprolol tartrate 25 MG tablet Commonly known as:  LOPRESSOR Take 1 tablet (25 mg total) by mouth 2 (two) times daily. What changed:  how much to take   morphine 20 MG/5ML solution Take 1.3 mLs (5.2 mg total) by mouth every 3 (three) hours as needed for pain (For pain or shortness of breath). What changed:    how much to take  when to take this  reasons to take this   multivitamin with minerals Tabs tablet Take 1 tablet by mouth daily.   mupirocin ointment 2 % Commonly known as:  BACTROBAN Place 1 application into the nose 2 (two) times daily.   nitroGLYCERIN 0.4 MG SL tablet Commonly known as:  NITROSTAT Place 0.4 mg under the tongue every 5 (five) minutes as needed for chest pain (MAX 3 TABLETS).   ondansetron 4 MG tablet Commonly known as:  ZOFRAN Take 4 mg by mouth every 8 (eight) hours as needed for nausea or vomiting.   polyethylene glycol packet Commonly known  as:  MIRALAX / GLYCOLAX Take 17 g by mouth daily.   torsemide 20 MG tablet Commonly known as:  DEMADEX Take 40 mg by mouth daily.   warfarin 3 MG tablet Commonly known as:  COUMADIN Take 3 mg by mouth daily.         Diet and Activity recommendation: See Discharge Instructions above   Consults obtained -  Cardiology PCCM Nephrology Palliative   Major procedures and Radiology Reports - PLEASE review detailed and final reports for all details, in brief -      Dg Chest 2 View  Result Date: 12/27/2017 CLINICAL DATA:  Follow-up congestive heart failure. EXAM: CHEST - 2 VIEW COMPARISON:  12/26/2017 FINDINGS: Endotracheal tube and nasogastric tube have been removed since prior study. Pacemaker remains in appropriate position. Transcatheter aortic valve replacement again noted. Aortic atherosclerosis. Stable mild cardiomegaly. Decreased  pulmonary interstitial edema. Persistent small left pleural effusion and left basilar atelectasis. IMPRESSION: Decreased pulmonary interstitial edema. Stable small left pleural effusion and left basilar atelectasis. Electronically Signed   By: Myles Rosenthal M.D.   On: 12/27/2017 14:37   Dg Chest 2 View  Result Date: 12/18/2017 CLINICAL DATA:  Increased shortness of breath and cough for the past 2 weeks. EXAM: CHEST - 2 VIEW COMPARISON:  Chest x-ray and CT chest dated December 06, 2017. FINDINGS: Unchanged right chest wall pacemaker. Stable cardiomegaly status post TAVR. Persistent pulmonary vascular congestion and mild interstitial edema, with slightly improved asymmetric hazy opacities involving the right lung. Trace left pleural effusion, decreased when compared to prior study. Improved aeration of the left lower lobe. No pneumothorax. No acute osseous abnormality. IMPRESSION: 1. Improved now mild pulmonary edema. 2. Decreased now trace left pleural effusion with improved aeration of the left lower lobe. Electronically Signed   By: Obie Dredge M.D.   On: 12/18/2017 15:00   Ct Chest Wo Contrast  Result Date: 12/24/2017 CLINICAL DATA:  Status post intubation. Respiratory failure. Elevated white blood cell count. EXAM: CT CHEST WITHOUT CONTRAST TECHNIQUE: Multidetector CT imaging of the chest was performed following the standard protocol without IV contrast. COMPARISON:  11/03/2017 FINDINGS: Cardiovascular: Normal caliber thoracic aorta with atherosclerosis. Prior TAVR. Stable cardiomegaly. Dual lead cardiac pacemaker. Mediastinum/Nodes: Mediastinum is suboptimally evaluated secondary to lack of intravenous contrast. Trachea, esophagus and thyroid gland demonstrate no focal abnormality. Bilateral hilar lymphadenopathy. Nasogastric tube coursing below the diaphragm within the stomach. Endotracheal tube in satisfactory position. Lungs/Pleura: Patchy airspace disease in the right upper lobe, left upper lobe,  right middle lobe and bilateral lower lobes, right worse than left. Bilateral small pleural effusions. Upper Abdomen: No acute upper abdominal abnormality. Musculoskeletal: No aggressive osseous lesion. No acute osseous abnormality. Mild thoracolumbar spondylosis. Partially visualize chondrocalcinosis of right shoulder. Mild osteoarthritis of the right glenohumeral joint. S-shaped scoliosis of the thoracolumbar spine. IMPRESSION: 1. Bilateral multilobar pneumonia. 2. Endotracheal tube in satisfactory position. 3.  Aortic Atherosclerosis (ICD10-I70.0). Electronically Signed   By: Elige Ko   On: 12/24/2017 08:32   Ct Chest Wo Contrast  Result Date: 12/06/2017 CLINICAL DATA:  Persistent shortness of breath. EXAM: CT CHEST WITHOUT CONTRAST TECHNIQUE: Multidetector CT imaging of the chest was performed following the standard protocol without IV contrast. COMPARISON:  Chest radiograph 12/06/2017 FINDINGS: Cardiovascular: Enlarged heart. No sizable pericardial effusion. Post aortic valve replacement. Dual lead cardiac pacemaker in appropriate position. Calcific atherosclerotic disease and tortuosity of the aorta Mediastinum/Nodes: Mild mediastinal lymphadenopathy. Index left prevascular lymph node measures 1.3 cm in short axis. Precarinal lymph node  measures 1.2 cm in short axis. Moderate in size hiatal hernia. Lungs/Pleura: Diffusely scattered patchy bilateral areas of airspace consolidation. More confluent airspace consolidation versus atelectasis in bilateral lung bases. Bilateral small pleural effusions. Upper Abdomen: No acute abnormality. Musculoskeletal: Spondylosis of the thoracic spine. IMPRESSION: Diffusely scattered patchy bilateral areas of airspace consolidation, with more confluent consolidation in bilateral lung bases. Findings most consistent with multifocal pneumonia, with superimposed atelectasis in the lung bases. Asymmetric pulmonary edema is a secondary consideration. Small bilateral pleural  effusions. Mild likely reactive mediastinal lymphadenopathy. Enlarged cardiac silhouette. Aortic Atherosclerosis (ICD10-I70.0). Electronically Signed   By: Ted Mcalpine M.D.   On: 12/06/2017 18:53   Dg Chest Port 1 View  Result Date: 12/31/2017 CLINICAL DATA:  Shortness of breath EXAM: PORTABLE CHEST 1 VIEW COMPARISON:  12/27/2017 FINDINGS: Right pacer remains in place, unchanged. Prior aortic valve repair. Cardiomegaly. Worsening bilateral interstitial and alveolar opacities, likely edema. More confluent airspace opacity at the left base could reflect atelectasis or infiltrate. Small left effusion. IMPRESSION: Worsening interstitial and alveolar opacities compatible with worsening edema. Small left effusion with left base atelectasis or infiltrate. Electronically Signed   By: Charlett Nose M.D.   On: 12/31/2017 09:48   Dg Chest Port 1 View  Result Date: 12/26/2017 CLINICAL DATA:  History of ETT EXAM: PORTABLE CHEST 1 VIEW COMPARISON:  December 25, 2017 FINDINGS: The ETT is in good position. The NG tube terminates below today's film. No pneumothorax. Small effusion with underlying opacity in left base is stable. The focal opacity in the medial right base persists but is less prominent the interval. Mild increased interstitial markings raise the possibility of mild edema/pulmonary venous congestion. No other change. IMPRESSION: 1. Support apparatus as above. 2. Improving infiltrate in medial right lung base. 3. Small left effusion with underlying opacity. 4. Suggested pulmonary venous congestion versus mild edema. Electronically Signed   By: Gerome Sam III M.D   On: 12/26/2017 07:26   Dg Chest Port 1 View  Result Date: 12/25/2017 CLINICAL DATA:  Respiratory failure EXAM: PORTABLE CHEST 1 VIEW COMPARISON:  Chest x-rays dated 12/24/2017 and 12/18/2017. FINDINGS: Today's study is hypoinspiratory. Increasingly dense opacity at the RIGHT lung base, possibly accentuated by the low lung volumes on  today's study. Persistent opacity at the LEFT lung base. No pneumothorax seen. Endotracheal tube is well positioned with tip approximately 2 cm above the carina. Enteric tube passes below the diaphragm. Dual lead pacemaker/ICD apparatus appears stable in position. Valvular hardware appears stable in position. Heart size and mediastinal contours appear stable. IMPRESSION: 1. Hypoinspiratory exam. Increasingly dense opacity within the RIGHT lower lung, compatible with the pneumonia at described on chest CT of 12/24/2017, possibly worsened but more likely accentuated by the low lung volumes on today's study. 2. Stable opacity at the LEFT lung base, pneumonia versus atelectasis, as also demonstrated on yesterday's chest CT. 3. Endotracheal tube appears well positioned with tip just above the level of the carina. Electronically Signed   By: Bary Richard M.D.   On: 12/25/2017 09:19   Dg Chest Port 1 View  Result Date: 12/24/2017 CLINICAL DATA:  Endotracheal tube placement. OG present. History of CHF, coronary artery disease, hypertension, pacemaker. EXAM: PORTABLE CHEST 1 VIEW COMPARISON:  Chest x-ray from earlier same day. Chest x-ray dated 12/18/2017. FINDINGS: Endotracheal tube appears well positioned with tip approximately 3 cm above the carina. Enteric tube passes below the diaphragm. Pacemaker leads appear grossly stable in position. Heart size and mediastinal contours are stable. Atherosclerotic changes  noted at the aortic arch. Aortic valve hardware appears stable in position. Worsening pulmonary edema pattern bilaterally. Probable small bilateral pleural effusions. Probable atelectasis at the LEFT lung base. No pneumothorax seen. IMPRESSION: 1. Endotracheal tube is well positioned with tip approximately 3 cm above the carina. 2. OG tube passes below the diaphragm. 3. Worsening pulmonary edema. 4. Probable small bilateral pleural effusions and LEFT basilar atelectasis. 5. Stable mild cardiomegaly.  Electronically Signed   By: Bary Richard M.D.   On: 12/24/2017 07:55   Dg Chest Portable 1 View  Result Date: 12/24/2017 CLINICAL DATA:  Shortness of breath and chest pain today. EXAM: PORTABLE CHEST 1 VIEW COMPARISON:  12/18/2017 FINDINGS: Cardiac pacemaker. Coronary valve prosthesis. Mild cardiac enlargement. Bilateral perihilar infiltration and airspace disease, more prominent on the right. This may represent edema or multifocal pneumonia. Appearances are more prominent than on the previous study. Probable small left pleural effusion. No pneumothorax. Calcification of the aorta. IMPRESSION: Cardiac enlargement with bilateral perihilar infiltration and small left pleural effusion. Progression since previous study. Electronically Signed   By: Burman Nieves M.D.   On: 12/24/2017 06:05   Dg Chest Port 1 View  Result Date: 12/06/2017 CLINICAL DATA:  Shortness of breath. EXAM: PORTABLE CHEST 1 VIEW COMPARISON:  12/04/2017 FINDINGS: Right-sided pacemaker unchanged. Evidence of previous aortic valve replacement unchanged. Lungs are hypoinflated demonstrate left basilar opacification likely effusion with atelectasis although infection in the left base is possible. There is interval worsening of the perihilar markings right worse than left compatible with mild asymmetric interstitial edema versus infection. Mild stable cardiomegaly. Remainder of the exam is unchanged. IMPRESSION: Left base opacification slightly worse likely small effusion with atelectasis, although infection is possible. Prominent bilateral perihilar markings right worse than left which may be due to asymmetric edema versus infection. Mild stable cardiomegaly. Electronically Signed   By: Elberta Fortis M.D.   On: 12/06/2017 08:01   Dg Chest Portable 1 View  Result Date: 12/04/2017 CLINICAL DATA:  Respiratory distress. EXAM: PORTABLE CHEST 1 VIEW COMPARISON:  Chest x-ray 11/07/2017. FINDINGS: Cardiac pacer with lead tip over the right atrium  right ventricle. Prior cardiac valve replacement. Cardiomegaly. Diffuse bilateral pulmonary interstitial prominence and small bilateral pleural effusions. Findings consistent with CHF. IMPRESSION: 1. Cardiac pacer with lead tips over the right atrium and right ventricle. Prior cardiac valve replacement. 2. Findings suggesting congestive heart failure with bilateral pulmonary interstitial edema and bilateral pleural effusions. Electronically Signed   By: Maisie Fus  Register   On: 12/04/2017 10:56   [place] Ekg Site Rite  Result Date: 12/25/2017 If Site Rite image not attached, placement could not be confirmed due to current cardiac rhythm.   Micro Results    Recent Results (from the past 240 hour(s))  Blood Culture (routine x 2)     Status: None   Collection Time: 12/24/17  6:30 AM  Result Value Ref Range Status   Specimen Description BLOOD RIGHT ARM  Final   Special Requests   Final    BOTTLES DRAWN AEROBIC AND ANAEROBIC Blood Culture adequate volume   Culture   Final    NO GROWTH 5 DAYS Performed at [facility], [address] [place], [place] [address]    Report Status 12/29/2017 FINAL  Final  Blood Culture (routine x 2)     Status: None   Collection Time: 12/24/17  6:37 AM  Result Value Ref Range Status   Specimen Description BLOOD RIGHT ANTECUBITAL  Final   Special Requests  Final    BOTTLES DRAWN AEROBIC AND ANAEROBIC Blood Culture adequate volume   Culture   Final    NO GROWTH 5 DAYS Performed at [facility], [address] [place], [place] [address]    Report Status 12/29/2017 FINAL  Final  MRSA PCR Screening     Status: None   Collection Time: 12/24/17 10:03 AM  Result Value Ref Range Status   MRSA by PCR NEGATIVE NEGATIVE Final    Comment:        The GeneXpert MRSA Assay (FDA approved for NASAL specimens only), is one component of a comprehensive MRSA colonization surveillance program. It is not intended to diagnose MRSA infection nor to guide or monitor  treatment for MRSA infections. Performed at [facility], [address], [place], [place] [address]   Culture, blood (routine x 2)     Status: None   Collection Time: 12/24/17 10:23 AM  Result Value Ref Range Status   Specimen Description BLOOD LEFT ARM  Final   Special Requests   Final    BOTTLES DRAWN AEROBIC ONLY Blood Culture results may not be optimal due to an inadequate volume of blood received in culture bottles   Culture   Final    NO GROWTH 5 DAYS Performed at [facility], [address] [place], [place] [address]    Report Status 12/29/2017 FINAL  Final  Culture, blood (routine x 2)     Status: None   Collection Time: 12/24/17 10:32 AM  Result Value Ref Range Status   Specimen Description BLOOD LEFT ARM  Final   Special Requests   Final    BOTTLES DRAWN AEROBIC ONLY Blood Culture results may not be optimal due to an inadequate volume of blood received in culture bottles   Culture   Final    NO GROWTH 5 DAYS Performed at [facility], [address] [place], [place] [address]    Report Status 12/29/2017 FINAL  Final       Today   Subjective:   [name] today has no headache,no chest abdominal pain, reports she had a poor night sleep .  Objective:   Blood pressure (!) 144/65, pulse 82, temperature 98.3 F (36.8 C), temperature source Oral, resp. rate 20, height 5\' 4"  (1.626 m), weight 71.6 kg, SpO2 96 %.   Intake/Output Summary (Last 24 hours) at 01/02/2018 1318 Last data filed at 01/02/2018 0500 Gross per 24 hour  Intake 580 ml  Output 1000 ml  Net -420 ml    Exam  Awake Alert, Oriented X 3, No new F.N deficits, frail, chronically ill-appearing, normal affect, appears to be comfortable symmetrical Chest wall movement, improved air entry at the bases, no further crackles, no wheezing  RRR,No Gallops,Rubs or new Murmurs, No Parasternal Heave +ve B.Sounds, Abd Soft, No tenderness, No rebound - guarding or rigidity. No Cyanosis,  Clubbing or edema, No new Rash or bruise   Data Review   CBC w Diff:  Lab Results  Component Value Date   WBC 10.1 01/02/2018   HGB 9.6 (L) 01/02/2018   HCT 30.2 (L) 01/02/2018   HCT 24.8 (L) 12/15/2014   PLT 291 01/02/2018   LYMPHOPCT 11 12/05/2017   MONOPCT 5 12/05/2017   EOSPCT 0 12/05/2017   BASOPCT 0 12/05/2017    CMP:  Lab Results  Component Value Date   NA 139 01/02/2018   NA 143 08/26/2016   K 3.2 (L) 01/02/2018  CL 101 01/02/2018   CO2 24 01/02/2018   BUN 48 (H) 01/02/2018   BUN 49 (A) 08/26/2016   CREATININE 3.98 (H) 01/02/2018   GLU 92 08/26/2016   PROT 5.6 (L) 12/24/2017   PROT 6.7 01/21/2017   ALBUMIN 3.0 (L) 01/01/2018   ALBUMIN 4.2 01/21/2017   BILITOT 1.0 12/24/2017   BILITOT 0.3 01/21/2017   ALKPHOS 66 12/24/2017   AST 33 12/24/2017   ALT 16 12/24/2017  .   Total Time in preparing paper work, data evaluation and todays exam - 35 minutes  Huey Bienenstock M.D on 01/02/2018 at 1:18 PM  [facility]   Office  [phone]

## 2018-01-02 NOTE — Progress Notes (Signed)
Subjective: [name] was seen resting comfortably in her bed this morning. She stated that she had some nausea earlier in the monring, but not anymore. She felt that her "bottom was sore". She stated that her breathing has improved slightly.   Objective: Vital signs in last 24 hours: Temp:  [98.3 F (36.8 C)-98.4 F (36.9 C)] 98.3 F (36.8 C) (09/14 0449) Pulse Rate:  [72-88] 82 (09/14 0449) Resp:  [18-20] 20 (09/14 0449) BP: (118-144)/(46-65) 144/65 (09/14 0449) SpO2:  [96 %-100 %] 96 % (09/14 0449) Weight:  [71.6 kg] 71.6 kg (09/14 0449) Weight change: 0.8 kg  Intake/Output from previous day: 09/13 0701 - 09/14 0700 In: 780 [P.O.:680; I.V.:100] Out: 1200 [Urine:1200] Intake/Output this shift: Total I/O In: -  Out: 500 [Urine:500]  Physical Exam  Constitutional: She appears well-developed and well-nourished. No distress.  HENT:  Head: Normocephalic and atraumatic.  Eyes: Conjunctivae are normal.  Cardiovascular: Normal rate, regular rhythm and normal heart sounds.  Respiratory: Effort normal and breath sounds normal. No respiratory distress. She has no wheezes. On 4L [place] GI: Soft. Bowel sounds are normal. She exhibits no distension. There is no tenderness.  Musculoskeletal: She exhibits no edema.  Neurological: She is alert.  Skin: She is not diaphoretic. No erythema.  Psychiatric: She has a normal mood and affect. Her behavior is normal. Judgment and thought content normal.    Lab Results: Recent Labs    12/31/17 0422  WBC 10.4  HGB 9.3*  HCT 29.2*  PLT 321   BMET:  Recent Labs    12/31/17 0422 01/01/18 0618  NA 142 139  K 3.2* 3.2*  CL 103 101  CO2 25 23  GLUCOSE 93 97  BUN 52* 50*  CREATININE 3.99* 4.00*  CALCIUM 10.0 9.9   No results for input(s): PTH in the last 72 hours. Iron Studies: No results for input(s): IRON, TIBC, TRANSFERRIN, FERRITIN in the last 72 hours. Studies/Results: No results found.  I have reviewed the patient's current  medications.  Assessment/Plan:  Acute on chronic kidney injury stage IV Patient has been nonoliguric with 1.2L of urine output over the past 24 hours and weight is 157lbs down from prior 163lbs. No labs to assess renal function today, however it has remained steady over the past few days. Palliative care team spoke with patient and her son and decision was made to not pursue dialysis and rather discharge with outpatient hospice services at [facility]. Continue Torsemide with discharge.  Anemia of CKD: Hb=9.6, rdw=16.8, mcv=100. Patient was given IV feraheme and subq aranesp on 9/10.  Hypokalemia  Pending BMP from today 9/14. Replete as needed   LOS: 9 days   [name] 01/02/2018,6:55 AM

## 2018-01-02 NOTE — Plan of Care (Signed)
  Problem: Education: Goal: Knowledge of General Education information will improve Description: Including pain rating scale, medication(s)/side effects and non-pharmacologic comfort measures Outcome: Completed/Met   Problem: Health Behavior/Discharge Planning: Goal: Ability to manage health-related needs will improve Outcome: Completed/Met   Problem: Clinical Measurements: Goal: Ability to maintain clinical measurements within normal limits will improve Outcome: Completed/Met Goal: Will remain free from infection Outcome: Completed/Met Goal: Diagnostic test results will improve Outcome: Completed/Met Goal: Respiratory complications will improve Outcome: Completed/Met Goal: Cardiovascular complication will be avoided Outcome: Completed/Met   Problem: Activity: Goal: Risk for activity intolerance will decrease Outcome: Completed/Met   Problem: Nutrition: Goal: Adequate nutrition will be maintained Outcome: Completed/Met   Problem: Coping: Goal: Level of anxiety will decrease Outcome: Completed/Met   Problem: Elimination: Goal: Will not experience complications related to bowel motility Outcome: Completed/Met Goal: Will not experience complications related to urinary retention Outcome: Completed/Met   Problem: Pain Managment: Goal: General experience of comfort will improve Outcome: Completed/Met   Problem: Skin Integrity: Goal: Risk for impaired skin integrity will decrease Outcome: Completed/Met   

## 2018-01-02 NOTE — Discharge Instructions (Signed)
Follow with Primary MD Pearson Grippe, MD  Or SNF physician -Please consult hospice when patient presents to SNF  Get CBC, CMP, 2 view Chest X ray checked  by Primary MD next visit.    Activity: As tolerated with Full fall precautions use walker/cane & assistance as needed   Disposition SNF   Diet: Heart Healthy with low-salt, dysphagia 1 with thin liquid, with feeding assistance and aspiration precautions.  For Heart failure patients - Check your Weight same time everyday, if you gain over 2 pounds, or you develop in leg swelling, experience more shortness of breath or chest pain, call your Primary MD immediately. Follow Cardiac Low Salt Diet and 1.5 lit/day fluid restriction.   On your next visit with your primary care physician please Get Medicines reviewed and adjusted.   Please request your Prim.MD to go over all Hospital Tests and Procedure/Radiological results at the follow up, please get all Hospital records sent to your Prim MD by signing hospital release before you go home.   If you experience worsening of your admission symptoms, develop shortness of breath, life threatening emergency, suicidal or homicidal thoughts you must seek medical attention immediately by calling 911 or calling your MD immediately  if symptoms less severe.  You Must read complete instructions/literature along with all the possible adverse reactions/side effects for all the Medicines you take and that have been prescribed to you. Take any new Medicines after you have completely understood and accpet all the possible adverse reactions/side effects.   Do not drive, operating heavy machinery, perform activities at heights, swimming or participation in water activities or provide baby sitting services if your were admitted for syncope or siezures until you have seen by Primary MD or a Neurologist and advised to do so again.  Do not drive when taking Pain medications.    Do not take more than prescribed Pain,  Sleep and Anxiety Medications  Special Instructions: If you have smoked or chewed Tobacco  in the last 2 yrs please stop smoking, stop any regular Alcohol  and or any Recreational drug use.  Wear Seat belts while driving.   Please note  You were cared for by a hospitalist during your hospital stay. If you have any questions about your discharge medications or the care you received while you were in the hospital after you are discharged, you can call the unit and asked to speak with the hospitalist on call if the hospitalist that took care of you is not available. Once you are discharged, your primary care physician will handle any further medical issues. Please note that NO REFILLS for any discharge medications will be authorized once you are discharged, as it is imperative that you return to your primary care physician (or establish a relationship with a primary care physician if you do not have one) for your aftercare needs so that they can reassess your need for medications and monitor your lab values.

## 2018-01-08 ENCOUNTER — Ambulatory Visit (INDEPENDENT_AMBULATORY_CARE_PROVIDER_SITE_OTHER): Payer: Medicare Other | Admitting: Cardiology

## 2018-01-08 ENCOUNTER — Encounter: Payer: Self-pay | Admitting: Cardiology

## 2018-01-08 VITALS — BP 142/84 | HR 83 | Ht 64.0 in | Wt 156.0 lb

## 2018-01-08 DIAGNOSIS — N184 Chronic kidney disease, stage 4 (severe): Secondary | ICD-10-CM

## 2018-01-08 DIAGNOSIS — Z952 Presence of prosthetic heart valve: Secondary | ICD-10-CM | POA: Diagnosis not present

## 2018-01-08 DIAGNOSIS — I5043 Acute on chronic combined systolic (congestive) and diastolic (congestive) heart failure: Secondary | ICD-10-CM

## 2018-01-08 DIAGNOSIS — I480 Paroxysmal atrial fibrillation: Secondary | ICD-10-CM

## 2018-01-08 LAB — BASIC METABOLIC PANEL
BUN / CREAT RATIO: 22 (ref 12–28)
BUN: 61 mg/dL — ABNORMAL HIGH (ref 8–27)
CO2: 26 mmol/L (ref 20–29)
Calcium: 10.1 mg/dL (ref 8.7–10.3)
Chloride: 95 mmol/L — ABNORMAL LOW (ref 96–106)
Creatinine, Ser: 2.81 mg/dL — ABNORMAL HIGH (ref 0.57–1.00)
GFR calc non Af Amer: 15 mL/min/{1.73_m2} — ABNORMAL LOW (ref >59)
GFR, EST AFRICAN AMERICAN: 17 mL/min/{1.73_m2} — AB (ref >59)
GLUCOSE: 88 mg/dL (ref 65–99)
Potassium: 4.5 mmol/L (ref 3.5–5.2)
SODIUM: 138 mmol/L (ref 134–144)

## 2018-01-08 NOTE — Progress Notes (Signed)
Cardiology Office Note:    Date:  01/08/2018   ID:  [name], DOB [birth year], MRN [id number]  PCP:  Pearson Grippe, MD  Cardiologist:  Tonny Bollman, MD  Referring MD: Pearson Grippe, MD   Chief Complaint  Patient presents with  . Hospitalization Follow-up    CHF, PNA, AKI    History of Present Illness:    [name] is a 84 y.o. female with a past medical history significant for NICM,chronic systolic and diastolic heart failure (EF 25-30% with grade 2 diastolic dysfunction),morbid obesity,severe AS s/p TAVR 2014, paroxsymal atrial fibrillationon Coumadin, HTN, CKD stage IV,COPD withchronic respiratory failure onhomeO2, OSA, bradycardia s/p pacemaker.  [name] was admitted to the hospital 12/24/2017-01/02/2018 for acute respiratory failure with hypoxia and hypercapnia, pneumonia and acute on chronic combined systolic and diastolic heart failure.  She required intubation.  Diuresis was challenging in the setting of advanced kidney disease.  She was not felt to be a dialysis candidate due to poor functional status, poor cardiac function, age and the negative impact that dialysis treatment would have on her quality of life.  She was sent home on torsemide.  She also had a previous hospital admission in August for acute shortness of breath that responded to Lasix.  She was discharged back to [facility] with hospice care. She is being followed by Dr. Allena Katz for nephrology.   [name] is here today for hospital follow-up with her son. Her voice is hoarse and raspy. She is using oxygen by [place].  She reports that he had her hospice assessment on Wednesday social worker will be back today hospice care.  She says that her breathing is about the same as it was the discharge she sleeps in a hospital bed with her head of the bed elevated.  She appears to be short of breath with talking she has no edema or JVD.  Basilar crackles in her lungs.  Anxious to make sure that she is getting  all the care of she needs.  She says that the staff at have not been giving her morphine as they are afraid it would slow her breathing.  We discussed her quality of life and comfort measures to some degree.  She and her son agreed that they would like for her to have morphine and be kept comfortable although they do want everything done but medically that would minimize her symptoms.  They do not want a feeding tube.  Past Medical History:  Diagnosis Date  . Acute on chronic renal failure [facility])    sees Dr Allena Katz   . Anemia    Acute blood loss  . Aortic regurgitation   . Aortic stenosis 10/13/2012   Low EF, low gradient with severe aortic stenosis confirmed by dobutamine stress echocardiogram s/p TAVR 12/2012  . Asthma   . Atrial fibrillation (HCC)    tachy-brady syndrome with <1% recurrent PAF since pacemaker placement  . Cataracts, bilateral   . Chronic combined systolic and diastolic CHF (congestive heart failure) (HCC)   . Chronic lower back pain   . CKD (chronic kidney disease)   . Coronary artery disease involving native coronary artery of native heart   . Dementia    Without behavioral disturbance  . Dysrhythmia   . Fibromyalgia   . Gastroesophageal reflux disease   . H/O dizziness   . H/O urinary frequency   . H/O: stroke   . Hard of hearing   . Headache    hx of  migraines   . Heart murmur   . History of blood transfusion   . History of bronchitis   . History of kidney stones   . History of urinary tract infection   . HLD (hyperlipidemia)   . HTN (hypertension)   . Hypothyroidism   . Neuropathy   . Nonischemic cardiomyopathy (HCC)   . On home oxygen therapy    patient uses at nite- 2L- has not used in > 6 months per patient  . Osteoarthritis   . Osteoporosis   . Pneumonia    hx of x 3   . PONV (postoperative nausea and vomiting)   . Presence of permanent cardiac pacemaker   . Pulmonary embolism (HCC)    HISTORY OF, the pt. had a recurrent bilateral pulmonary  emboli in 2005, on warfarin therapy and at which time she under went implantation of IVC filter  . Repeated falls   . Rupture of right patellar tendon   . Sciatica   . Shortness of breath dyspnea    with exertion   . Sleep apnea    uses oxygen at night and PRN- not used since > 6 months / DOES NOT USE  C-PAP  . Spinal stenosis   . Stroke (HCC)   . Symptomatic bradycardia   . Symptomatic bradycardia 2012   s/p Medtronic PPM  . Syncope   . Urinary incontinence   . Urinary urgency     Past Surgical History:  Procedure Laterality Date  . ABDOMINAL HYSTERECTOMY    . APPENDECTOMY    . BACK SURGERY    . BUNIONECTOMY    . CARDIAC CATHETERIZATION    . CATARACT EXTRACTION    . CENTRAL VENOUS CATHETER INSERTION Left 12/21/2012   Procedure: INSERTION CENTRAL LINE ADULT;  Surgeon: Tonny Bollman, MD;  Location[facility] OR;  Service: Open Heart Surgery;  Laterality: Left;  . ELBOW SURGERY     bilat   . INTRAOPERATIVE TRANSESOPHAGEAL ECHOCARDIOGRAM N/A 12/21/2012   Procedure: INTRAOPERATIVE TRANSESOPHAGEAL ECHOCARDIOGRAM;  Surgeon: Tonny Bollman, MD;  Location: [facility] OR;  Service: Open Heart Surgery;  Laterality: N/A;  . KNEE SURGERY Left   . LEFT AND RIGHT HEART CATHETERIZATION WITH CORONARY ANGIOGRAM N/A 10/18/2012   Procedure: LEFT AND RIGHT HEART CATHETERIZATION WITH CORONARY ANGIOGRAM;  Surgeon: Tonny Bollman, MD;  Location: [facility] CATH LAB;  Service: Cardiovascular;  Laterality: N/A;  . NASAL SEPTUM SURGERY    . NOSE SURGERY     X 2  . ORIF PATELLA Right 03/08/2015   Procedure:  OPEN REDUCTION INTERNAL FIXATION RIGHT  PATELLA TENDON AVULSION;  Surgeon: Durene Romans, MD;  Location: [facility] ORS;  Service: Orthopedics;  Laterality: Right;  . PACEMAKER INSERTION     Medtronic  . PATELLAR TENDON REPAIR Right 06/25/2015   Procedure: RIGHT PATELLA TENDON REVISION/REPAIR;  Surgeon: Durene Romans, MD;  Location: [facility] ORS;  Service: Orthopedics;  Laterality: Right;  . SHOULDER SURGERY     bilat   . THYROIDECTOMY,  PARTIAL    . TONSILLECTOMY    . TOTAL KNEE ARTHROPLASTY Right 12/04/2014   Procedure: RIGHT TOTAL  KNEE ARTHROPLASTY;  Surgeon: Durene Romans, MD;  Location: [facility] ORS;  Service: Orthopedics;  Laterality: Right;  . TRANSCATHETER AORTIC VALVE REPLACEMENT, TRANSFEMORAL  12/21/2012   a. 29mm Edwards Sapien XT transcatheter heart valve placed via open left transfemoral approach b. Intra-op TEE: well-seated bioprosthetic aortic valve with mean gradient 2 mmHg, trivial AI, mild MR, EF 30-35%  . TRANSCATHETER AORTIC VALVE REPLACEMENT, TRANSFEMORAL N/A 12/21/2012  Procedure: TRANSCATHETER AORTIC VALVE REPLACEMENT, TRANSFEMORAL;  Surgeon: Tonny Bollman, MD;  Location: [facility] OR;  Service: Open Heart Surgery;  Laterality: N/A;    Current Medications: Current Meds  Medication Sig  . allopurinol (ZYLOPRIM) 100 MG tablet Take 100 mg by mouth daily.  Marland Kitchen aluminum-magnesium hydroxide-simethicone (MAALOX) 200-200-20 MG/5ML SUSP Take 30 mLs by mouth 3 (three) times daily as needed (for indegestion).  Marland Kitchen amiodarone (PACERONE) 100 MG tablet Take 100 mg by mouth daily.  . calcitRIOL (ROCALTROL) 0.25 MCG capsule Take 0.25 mcg by mouth daily.   . cholecalciferol (VITAMIN D) 1000 units tablet Take 2,000 Units by mouth at bedtime.  . cycloSPORINE (RESTASIS) 0.05 % ophthalmic emulsion Place 1 drop into both eyes 2 (two) times daily.   Marland Kitchen docusate sodium (COLACE) 100 MG capsule Take 1 capsule (100 mg total) by mouth 2 (two) times daily.  Marland Kitchen donepezil (ARICEPT) 10 MG tablet Take 10 mg by mouth at bedtime.   . DULoxetine (CYMBALTA) 60 MG capsule Take 60 mg by mouth daily at 6 PM.   . ferrous sulfate 325 (65 FE) MG tablet Take 325 mg by mouth daily.   Marland Kitchen gabapentin (NEURONTIN) 100 MG capsule Take 200 mg by mouth 2 (two) times daily.  Marland Kitchen guaiFENesin (MUCINEX) 600 MG 12 hr tablet Take 2 tablets (1,200 mg total) by mouth 2 (two) times daily.  Marland Kitchen ipratropium-albuterol (DUONEB) 0.5-2.5 (3) MG/3ML SOLN Take 3 mLs by nebulization every 6 (six)  hours as needed (wheezing/shortness of breath).  Marland Kitchen ipratropium-albuterol (DUONEB) 0.5-2.5 (3) MG/3ML SOLN Take 3 mLs by nebulization 2 (two) times daily.  Marland Kitchen levothyroxine (SYNTHROID, LEVOTHROID) 100 MCG tablet Take 1 tablet (100 mcg total) by mouth daily before breakfast.  . lidocaine (LIDODERM) 5 % Place 1 patch onto the skin daily. Apply to lower back as needed for pain.  Marland Kitchen linaclotide (LINZESS) 290 MCG CAPS capsule Take 290 mcg by mouth daily.   . Menthol, Topical Analgesic, (BIOFREEZE) 4 % GEL Apply 1 application topically 2 (two) times daily.  . metoprolol tartrate (LOPRESSOR) 25 MG tablet Take 1 tablet (25 mg total) by mouth 2 (two) times daily.  . Multiple Vitamin (MULTIVITAMIN WITH MINERALS) TABS tablet Take 1 tablet by mouth daily.  . mupirocin ointment (BACTROBAN) 2 % Place 1 application into the nose 2 (two) times daily.  . nitroGLYCERIN (NITROSTAT) 0.4 MG SL tablet Place 0.4 mg under the tongue every 5 (five) minutes as needed for chest pain (MAX 3 TABLETS).   . ondansetron (ZOFRAN) 4 MG tablet Take 4 mg by mouth every 8 (eight) hours as needed for nausea or vomiting.  . polyethylene glycol (MIRALAX / GLYCOLAX) packet Take 17 g by mouth daily.  Marland Kitchen torsemide (DEMADEX) 20 MG tablet Take 40 mg by mouth daily.   Marland Kitchen warfarin (COUMADIN) 3 MG tablet Take 3 mg by mouth daily.     Allergies:   Nubain [nalbuphine hcl]; Codeine; and Darvocet [propoxyphene n-acetaminophen]   Social History   Socioeconomic History  . Marital status: Divorced    Spouse name: Not on file  . Number of children: Not on file  . Years of education: Not on file  . Highest education level: Not on file  Occupational History  . Not on file  Social Needs  . Financial resource strain: Not on file  . Food insecurity:    Worry: Not on file    Inability: Not on file  . Transportation needs:    Medical: Not on file    Non-medical: Not  on file  Tobacco Use  . Smoking status: Never Smoker  . Smokeless tobacco: Never  Used  Substance and Sexual Activity  . Alcohol use: No  . Drug use: No  . Sexual activity: Not Currently  Lifestyle  . Physical activity:    Days per week: Not on file    Minutes per session: Not on file  . Stress: Not on file  Relationships  . Social connections:    Talks on phone: Not on file    Gets together: Not on file    Attends religious service: Not on file    Active member of club or organization: Not on file    Attends meetings of clubs or organizations: Not on file    Relationship status: Not on file  Other Topics Concern  . Not on file  Social History Narrative  . Not on file     Family History: The patient's family history includes Epilepsy in her father; Ovarian cancer in her mother. ROS:   Please see the history of present illness.     All other systems reviewed and are negative.  EKGs/Labs/Other Studies Reviewed:    The following studies were reviewed today:  Echocardiogram 11/08/2017 Study Conclusions - Left ventricle: The cavity size was moderately dilated. Wall   thickness was increased in a pattern of mild LVH. Systolic   function was severely reduced. The estimated ejection fraction   was in the range of 25% to 30%. Diffuse hypokinesis, with   akinesis of specific walls below. Features are consistent with a   pseudonormal left ventricular filling pattern, with concomitant   abnormal relaxation and increased filling pressure (grade 2   diastolic dysfunction). - Regional wall motion abnormality: Akinesis of the mid   anteroseptal, basal-mid inferoseptal, and mid inferior   myocardium; hypokinesis of the mid anterior, basal anteroseptal,   apical septal, apical lateral, and apical myocardium. - Aortic valve: A prosthesis was present and functioning normally.   The prosthesis had a normal range of motion. The sewing ring   appeared normal, had no rocking motion, and showed no evidence of   dehiscence. There was mild regurgitation. Mean gradient (S):  11   mm Hg. Peak gradient (S): 18 mm Hg. Valve area (VTI): 1.91 cm^2.   Valve area (Vmax): 1.73 cm^2. Valve area (Vmean): 1.71 cm^2. - Mitral valve: Mobility was restricted. There was mild   regurgitation directed eccentrically. - Left atrium: The atrium was moderately dilated. - Atrial septum: No defect or patent foramen ovale was identified. - Tricuspid valve: There was mild regurgitation. - Pulmonic valve: There was mild regurgitation. - Pulmonary arteries: PA peak pressure: 32 mm Hg (S).  Impressions: - LV function appears worse from prior. TAVR in place, with   gradients slightly elevated from prior (peak 18, mean 11, AVA   1.91). Valvular AI also worse compared to prior.   EKG:  EKG is not ordered today.    Recent Labs: 12/24/2017: ALT 16; B Natriuretic Peptide 779.1; TSH 8.453 12/28/2017: Magnesium 2.0 01/02/2018: Hemoglobin 9.6; Platelets 291 01/08/2018: BUN 61; Creatinine, Ser 2.81; Potassium 4.5; Sodium 138   Recent Lipid Panel    Component Value Date/Time   CHOL 153 10/01/2015   TRIG 196 (H) 12/24/2017 1023   HDL 36 10/01/2015   CHOLHDL 1.8 06/16/2010 0415   VLDL 13 06/16/2010 0415   LDLCALC 67 10/01/2015    Physical Exam:    VS:  BP (!) 142/84   Pulse 83   Ht 5'  4" (1.626 m)   Wt 156 lb (70.8 kg)   SpO2 96%   BMI 26.78 kg/m     Wt Readings from Last 3 Encounters:  01/08/18 156 lb (70.8 kg)  01/02/18 157 lb 13.6 oz (71.6 kg)  12/18/17 157 lb 12.8 oz (71.6 kg)     Physical Exam  Constitutional: She is oriented to person, place, and time.  Chronically ill, elderly, frail female wearing oxygen  HENT:  Head: Normocephalic and atraumatic.  Neck: Normal range of motion. Neck supple. No JVD present.  Cardiovascular: Normal rate and regular rhythm. Exam reveals no friction rub.  No murmur heard. Split S2  Pulmonary/Chest: She has no wheezes.  Slightly increased work of breathing.  Voice is hoarse and raspy.  Few crackles in the bases.  Abdominal: Soft.  Bowel sounds are normal.  Musculoskeletal: She exhibits no edema.  Neurological: She is alert and oriented to person, place, and time.  Skin: Skin is warm and dry.  Psychiatric: She has a normal mood and affect. Her behavior is normal. Thought content normal.  Vitals reviewed.    ASSESSMENT:    1. Acute on chronic combined systolic and diastolic CHF (congestive heart failure) (HCC)   2. Paroxysmal atrial fibrillation (HCC)   3. CKD (chronic kidney disease) stage 4, GFR 15-29 ml/min (HCC)   4. S/P TAVR (transcatheter aortic valve replacement)    PLAN:    In order of problems listed above:  Acute on chronic systolic heart failure -EF 25-30% on echo in 10/2017.  Recent hospitalization in August that responded to Lasix and then hospitalization 12/24/2017-01/02/2018 for respiratory failure, pneumonia, heart failure diuresis was challenging due to advanced kidney disease with creatinine peaking at 4.12.  Is in the process of setting up hospice care where she is residing at [facility]  Volume status looks pretty good today.  She has no edema or JVD.  She still has a few crackles in her bases.  Recent hospitalization for respiratory failure -Multifactorial with repeated aspiration, pneumonia, heart failure, COPD -Her son is concerned because she has had repeated hospitalizations for pneumonia and heart failure.  She seems to be stable currently although she is still weak from the pneumonia.  She will need to be watched closely.. -We briefly discussed the role of aspiration pneumonia process and that this may cause more problems in the future.  She does not want a feeding tube. -I encouraged the patient and her son to discuss goals of care with the hospice nurse and set up a plan for treatment from here forward. -She wants to continue to take the morphine for air hunger as needed.  Paroxysmal atrial fibrillation -On amiodarone, metoprolol was increased recent hospitalization for better heart  rate control. -Today she has a regular rate and rhythm -Warfarin for stroke risk reduction  CKD stage IV -Worsened renal function with diuresis during recent hospitalization.  Seen by nephrology and felt to be not a good candidate for dialysis due to her poor functional status, poor cardiac function, age, and the effects of dialysis on quality of life. -Will recheck Bmet today  Severe AS s/p TAVR 2014 -Slightly worse hemodynamic gradient but overall stable by echo in 10/2017   We will see her back in 3 weeks for close follow-up.  I discussed with her son that if she should deteriorate or need as sooner but they can call and are seen.  She is at [facility] place with nursing care.   Medication Adjustments/Labs and Tests Ordered:  Current medicines are reviewed at length with the patient today.  Concerns regarding medicines are outlined above. Labs and tests ordered and medication changes are outlined in the patient instructions below:  Patient Instructions  Medication Instructions: Your physician recommends that you continue on your current medications as directed. Please refer to the Current Medication list given to you today.]   Labwork: TODAY: BMET  Procedures/Testing: None   Follow-Up: Your physician recommends that you schedule a follow-up appointment in: 3 weeks with Dr. Excell Seltzer or someone on his care team   Any Additional Special Instructions Will Be Listed Below (If Applicable).     If you need a refill on your cardiac medications before your next appointment, please call your pharmacy.      Signed, Berton Bon, NP  01/08/2018 5:52 PM    [facility]

## 2018-01-08 NOTE — Patient Instructions (Signed)
Medication Instructions: Your physician recommends that you continue on your current medications as directed. Please refer to the Current Medication list given to you today.]   Labwork: TODAY: BMET  Procedures/Testing: None   Follow-Up: Your physician recommends that you schedule a follow-up appointment in: 3 weeks with Dr. Excell Seltzer or someone on his care team   Any Additional Special Instructions Will Be Listed Below (If Applicable).     If you need a refill on your cardiac medications before your next appointment, please call your pharmacy.

## 2018-01-11 ENCOUNTER — Telehealth: Payer: Self-pay | Admitting: Pulmonary Disease

## 2018-01-11 NOTE — Telephone Encounter (Signed)
Will close this encounter.  

## 2018-01-11 NOTE — Telephone Encounter (Signed)
Patient's son returned call.  Verified PFT and OV is scheduled for 01/19/18.  He states that was all he needed to know.  NO call back is needed.

## 2018-01-11 NOTE — Telephone Encounter (Signed)
Left message for patients son to call back.

## 2018-01-12 ENCOUNTER — Telehealth: Payer: Self-pay | Admitting: Pulmonary Disease

## 2018-01-12 NOTE — Telephone Encounter (Signed)
Called and spoke with [name], she stated that the patient is now on Hospice and is now making new choices. They wanted to know why she needs the PFT completed and wanted to know if it was necessary. Dr. Isaiah Serge please advise, thank you.

## 2018-01-13 ENCOUNTER — Telehealth: Payer: Self-pay | Admitting: Pulmonary Disease

## 2018-01-13 NOTE — Telephone Encounter (Signed)
Left detailed message with pt's son, Onalee Hua, making him aware that ILD questionnaire will be mailed to pt.  I have requested that pt complete form and bring them to her upcoming apt.  Nothing further is needed.

## 2018-01-14 NOTE — Telephone Encounter (Signed)
I have called [name] with Hospice and she states that the family is now going back and forth as to where they want to do this test so she has asked that I leave it scheduled for now until they make the choice to continue or not. The family will call us and let us know. Nothing  Further need at this time.

## 2018-01-14 NOTE — Telephone Encounter (Signed)
We can cancel PFTs

## 2018-01-18 ENCOUNTER — Ambulatory Visit: Payer: Medicare Other | Admitting: Physician Assistant

## 2018-01-19 ENCOUNTER — Ambulatory Visit: Payer: Medicare Other | Admitting: Pulmonary Disease

## 2018-01-29 ENCOUNTER — Ambulatory Visit: Admitting: Cardiology

## 2018-01-29 ENCOUNTER — Telehealth: Payer: Self-pay

## 2018-01-29 NOTE — Telephone Encounter (Signed)
Fuller Song from [facility] called to [facility] upcoming appointments because the Hospice Doctor do not want the patient to travel anywhere. She ask if the pt can do Home remote monitoring only. I gave her the number to [employer] to order the pt another home monitor. I will call Cala Bradford to check to see if she has the home remote monitoring in place for the pt.

## 2018-01-29 NOTE — Progress Notes (Deleted)
Cardiology Office Note:    Date:  01/29/2018   ID:  [name], DOB 01/29/1933, MRN [id number]  PCP:  Pearson Grippe, MD  Cardiologist:  Tonny Bollman, MD  Referring MD: Pearson Grippe, MD   No chief complaint on file. ***  History of Present Illness:    [name] is a 84 y.o. female with a past medical history significant for NICM,chronic systolic and diastolic heart failure (EF 25-30% with grade 2 diastolic dysfunction),morbid obesity,severe AS s/p TAVR 2014, paroxsymal atrial fibrillationon Coumadin, HTN, CKD stage IV,COPD withchronic respiratory failure onhomeO2, OSA, bradycardia s/p pacemaker.  [name] was admitted to the hospital 12/24/2017-01/02/2018 for acute respiratory failure with hypoxia and hypercapnia, pneumonia and acute on chronic combined systolic and diastolic heart failure.  She required intubation.  Diuresis was challenging in the setting of advanced kidney disease.  She was not felt to be a dialysis candidate due to poor functional status, poor cardiac function, age and the negative impact that dialysis treatment would have on her quality of life.  She was sent home on torsemide.  She also had a previous hospital admission in August for acute shortness of breath that responded to Lasix.  She was discharged back to [facility] with hospice care. She is being followed by Dr. Allena Katz for nephrology.   I saw [name] in the office on 01/08/2018 for hospital follow-up.  She was getting established with hospice care.  She is here today for close follow-up as the patient and her son were concerned about her recent repeated hospitalizations and would like to avoid that.   Past Medical History:  Diagnosis Date  . Acute on chronic renal failure [facility])    sees Dr Allena Katz   . Anemia    Acute blood loss  . Aortic regurgitation   . Aortic stenosis 10/13/2012   Low EF, low gradient with severe aortic stenosis confirmed by dobutamine stress echocardiogram s/p  TAVR 12/2012  . Asthma   . Atrial fibrillation (HCC)    tachy-brady syndrome with <1% recurrent PAF since pacemaker placement  . Cataracts, bilateral   . Chronic combined systolic and diastolic CHF (congestive heart failure) (HCC)   . Chronic lower back pain   . CKD (chronic kidney disease)   . Coronary artery disease involving native coronary artery of native heart   . Dementia    Without behavioral disturbance  . Dysrhythmia   . Fibromyalgia   . Gastroesophageal reflux disease   . H/O dizziness   . H/O urinary frequency   . H/O: stroke   . Hard of hearing   . Headache    hx of migraines   . Heart murmur   . History of blood transfusion   . History of bronchitis   . History of kidney stones   . History of urinary tract infection   . HLD (hyperlipidemia)   . HTN (hypertension)   . Hypothyroidism   . Neuropathy   . Nonischemic cardiomyopathy (HCC)   . On home oxygen therapy    patient uses at nite- 2L- has not used in > 6 months per patient  . Osteoarthritis   . Osteoporosis   . Pneumonia    hx of x 3   . PONV (postoperative nausea and vomiting)   . Presence of permanent cardiac pacemaker   . Pulmonary embolism (HCC)    HISTORY OF, the pt. had a recurrent bilateral pulmonary emboli in 2005, on warfarin therapy and at which time she under  went implantation of IVC filter  . Repeated falls   . Rupture of right patellar tendon   . Sciatica   . Shortness of breath dyspnea    with exertion   . Sleep apnea    uses oxygen at night and PRN- not used since > 6 months / DOES NOT USE  C-PAP  . Spinal stenosis   . Stroke (HCC)   . Symptomatic bradycardia   . Symptomatic bradycardia 2012   s/p Medtronic PPM  . Syncope   . Urinary incontinence   . Urinary urgency     Past Surgical History:  Procedure Laterality Date  . ABDOMINAL HYSTERECTOMY    . APPENDECTOMY    . BACK SURGERY    . BUNIONECTOMY    . CARDIAC CATHETERIZATION    . CATARACT EXTRACTION    . CENTRAL VENOUS  CATHETER INSERTION Left 12/21/2012   Procedure: INSERTION CENTRAL LINE ADULT;  Surgeon: Tonny Bollman, MD;  [facility];  Service: Open Heart Surgery;  Laterality: Left;  . ELBOW SURGERY     bilat   . INTRAOPERATIVE TRANSESOPHAGEAL ECHOCARDIOGRAM N/A 12/21/2012   Procedure: INTRAOPERATIVE TRANSESOPHAGEAL ECHOCARDIOGRAM;  Surgeon: Tonny Bollman, MD;  [facility];  Service: Open Heart Surgery;  Laterality: N/A;  . KNEE SURGERY Left   . LEFT AND RIGHT HEART CATHETERIZATION WITH CORONARY ANGIOGRAM N/A 10/18/2012   Procedure: LEFT AND RIGHT HEART CATHETERIZATION WITH CORONARY ANGIOGRAM;  Surgeon: Tonny Bollman, MD;  Location: [facility] CATH LAB;  Service: Cardiovascular;  Laterality: N/A;  . NASAL SEPTUM SURGERY    . NOSE SURGERY     X 2  . ORIF PATELLA Right 03/08/2015   Procedure:  OPEN REDUCTION INTERNAL FIXATION RIGHT  PATELLA TENDON AVULSION;  Surgeon: Durene Romans, MD;  Location: [facility] ORS;  Service: Orthopedics;  Laterality: Right;  . PACEMAKER INSERTION     Medtronic  . PATELLAR TENDON REPAIR Right 06/25/2015   Procedure: RIGHT PATELLA TENDON REVISION/REPAIR;  Surgeon: Durene Romans, MD;  Location: [facility] ORS;  Service: Orthopedics;  Laterality: Right;  . SHOULDER SURGERY     bilat   . THYROIDECTOMY, PARTIAL    . TONSILLECTOMY    . TOTAL KNEE ARTHROPLASTY Right 12/04/2014   Procedure: RIGHT TOTAL  KNEE ARTHROPLASTY;  Surgeon: Durene Romans, MD;  Location: [facility] ORS;  Service: Orthopedics;  Laterality: Right;  . TRANSCATHETER AORTIC VALVE REPLACEMENT, TRANSFEMORAL  12/21/2012   a. 29mm Edwards Sapien XT transcatheter heart valve placed via open left transfemoral approach b. Intra-op TEE: well-seated bioprosthetic aortic valve with mean gradient 2 mmHg, trivial AI, mild MR, EF 30-35%  . TRANSCATHETER AORTIC VALVE REPLACEMENT, TRANSFEMORAL N/A 12/21/2012   Procedure: TRANSCATHETER AORTIC VALVE REPLACEMENT, TRANSFEMORAL;  Surgeon: Tonny Bollman, MD;  Location: [facility] OR;  Service: Open Heart Surgery;  Laterality:  N/A;    Current Medications: No outpatient medications have been marked as taking for the 01/29/18 encounter (Appointment) with Berton Bon, NP.     Allergies:   Nubain [nalbuphine hcl]; Codeine; and Darvocet [propoxyphene n-acetaminophen]   Social History   Socioeconomic History  . Marital status: Divorced    Spouse name: Not on file  . Number of children: Not on file  . Years of education: Not on file  . Highest education level: Not on file  Occupational History  . Not on file  Social Needs  . Financial resource strain: Not on file  . Food insecurity:    Worry: Not on file    Inability: Not on file  . Transportation  needs:    Medical: Not on file    Non-medical: Not on file  Tobacco Use  . Smoking status: Never Smoker  . Smokeless tobacco: Never Used  Substance and Sexual Activity  . Alcohol use: No  . Drug use: No  . Sexual activity: Not Currently  Lifestyle  . Physical activity:    Days per week: Not on file    Minutes per session: Not on file  . Stress: Not on file  Relationships  . Social connections:    Talks on phone: Not on file    Gets together: Not on file    Attends religious service: Not on file    Active member of club or organization: Not on file    Attends meetings of clubs or organizations: Not on file    Relationship status: Not on file  Other Topics Concern  . Not on file  Social History Narrative  . Not on file     Family History: The patient's ***family history includes Epilepsy in her father; Ovarian cancer in her mother. ROS:   Please see the history of present illness.    *** All other systems reviewed and are negative.  EKGs/Labs/Other Studies Reviewed:    The following studies were reviewed today: ***  EKG:  EKG is *** ordered today.  The ekg ordered today demonstrates ***  Recent Labs: 12/24/2017: ALT 16; B Natriuretic Peptide 779.1; TSH 8.453 12/28/2017: Magnesium 2.0 01/02/2018: Hemoglobin 9.6; Platelets 291 01/08/2018:  BUN 61; Creatinine, Ser 2.81; Potassium 4.5; Sodium 138   Recent Lipid Panel    Component Value Date/Time   CHOL 153 10/01/2015   TRIG 196 (H) 12/24/2017 1023   HDL 36 10/01/2015   CHOLHDL 1.8 06/16/2010 0415   VLDL 13 06/16/2010 0415   LDLCALC 67 10/01/2015    Physical Exam:    VS:  There were no vitals taken for this visit.    Wt Readings from Last 3 Encounters:  01/08/18 156 lb (70.8 kg)  01/02/18 157 lb 13.6 oz (71.6 kg)  12/18/17 157 lb 12.8 oz (71.6 kg)     Physical Exam***   ASSESSMENT:    1. Acute on chronic combined systolic and diastolic CHF (congestive heart failure) (HCC)   2. Paroxysmal atrial fibrillation (HCC)   3. CKD (chronic kidney disease) stage 4, GFR 15-29 ml/min (HCC)   4. S/P TAVR (transcatheter aortic valve replacement)    PLAN:    In order of problems listed above:  Acute on chronic systolic heart failure: Recent repeated hospitalizations.  EF 25-30% on echo in 10/2017  Chronic respiratory failure  Paroxysmal atrial fibrillation -On amiodarone, metoprolol with good rate control -On warfarin for stroke risk reduction  CKD stage IV -Worsened renal function with diuresis during  recent hospitalization.  Seen by nephrology and felt to be not a good candidate for dialysis due to her poor functional status, poor cardiac function, age, and the effects of dialysis on quality of life. -Labs at office visit on 01/08/2018 with slight improvement in serum creatinine to 2.81, potassium stable at 4.5.  Severe AS s/p TAVR 2014 -Slightly worse hemodynamic gradient but overall stable by echo in 10/2017    Medication Adjustments/Labs and Tests Ordered: Current medicines are reviewed at length with the patient today.  Concerns regarding medicines are outlined above. Labs and tests ordered and medication changes are outlined in the patient instructions below:  There are no Patient Instructions on file for this visit.   Signed, Berton Bon, NP  01/29/2018 5:23 AM    [facility]

## 2018-02-18 ENCOUNTER — Encounter: Payer: Self-pay | Admitting: Cardiology

## 2018-03-03 ENCOUNTER — Encounter: Admitting: Internal Medicine

## 2018-03-21 DEATH — deceased
